# Patient Record
Sex: Female | Born: 1945
Health system: Southern US, Community
[De-identification: ages and names within clinical notes are randomized; demographics above are authoritative.]

## PROBLEM LIST (undated history)

## (undated) DIAGNOSIS — E785 Hyperlipidemia, unspecified: Secondary | ICD-10-CM

## (undated) DIAGNOSIS — K219 Gastro-esophageal reflux disease without esophagitis: Secondary | ICD-10-CM

## (undated) DIAGNOSIS — E119 Type 2 diabetes mellitus without complications: Secondary | ICD-10-CM

## (undated) DIAGNOSIS — H409 Unspecified glaucoma: Secondary | ICD-10-CM

## (undated) DIAGNOSIS — J84112 Idiopathic pulmonary fibrosis: Secondary | ICD-10-CM

## (undated) DIAGNOSIS — M1990 Unspecified osteoarthritis, unspecified site: Secondary | ICD-10-CM

## (undated) DIAGNOSIS — D649 Anemia, unspecified: Secondary | ICD-10-CM

## (undated) DIAGNOSIS — I4891 Unspecified atrial fibrillation: Secondary | ICD-10-CM

## (undated) DIAGNOSIS — R0602 Shortness of breath: Secondary | ICD-10-CM

## (undated) DIAGNOSIS — I509 Heart failure, unspecified: Secondary | ICD-10-CM

## (undated) DIAGNOSIS — F419 Anxiety disorder, unspecified: Secondary | ICD-10-CM

## (undated) DIAGNOSIS — T4145XA Adverse effect of unspecified anesthetic, initial encounter: Secondary | ICD-10-CM

## (undated) DIAGNOSIS — N189 Chronic kidney disease, unspecified: Secondary | ICD-10-CM

## (undated) DIAGNOSIS — T8859XA Other complications of anesthesia, initial encounter: Secondary | ICD-10-CM

## (undated) DIAGNOSIS — G4730 Sleep apnea, unspecified: Secondary | ICD-10-CM

## (undated) DIAGNOSIS — M545 Low back pain, unspecified: Secondary | ICD-10-CM

## (undated) DIAGNOSIS — D689 Coagulation defect, unspecified: Secondary | ICD-10-CM

## (undated) DIAGNOSIS — K649 Unspecified hemorrhoids: Secondary | ICD-10-CM

## (undated) DIAGNOSIS — F329 Major depressive disorder, single episode, unspecified: Secondary | ICD-10-CM

## (undated) DIAGNOSIS — F32A Depression, unspecified: Secondary | ICD-10-CM

## (undated) DIAGNOSIS — Z8489 Family history of other specified conditions: Secondary | ICD-10-CM

## (undated) DIAGNOSIS — I10 Essential (primary) hypertension: Secondary | ICD-10-CM

## (undated) DIAGNOSIS — K635 Polyp of colon: Secondary | ICD-10-CM

## (undated) DIAGNOSIS — IMO0002 Reserved for concepts with insufficient information to code with codable children: Secondary | ICD-10-CM

## (undated) HISTORY — PX: ABDOMINAL HYSTERECTOMY: SHX81

## (undated) HISTORY — DX: Coagulation defect, unspecified: D68.9

## (undated) HISTORY — DX: Type 2 diabetes mellitus without complications: E11.9

## (undated) HISTORY — PX: ROTATOR CUFF REPAIR: SHX139

## (undated) HISTORY — PX: CATARACT EXTRACTION, BILATERAL: SHX1313

## (undated) HISTORY — DX: Unspecified atrial fibrillation: I48.91

## (undated) HISTORY — DX: Gastro-esophageal reflux disease without esophagitis: K21.9

## (undated) HISTORY — DX: Unspecified hemorrhoids: K64.9

## (undated) HISTORY — PX: COLONOSCOPY: SHX174

## (undated) HISTORY — DX: Sleep apnea, unspecified: G47.30

## (undated) HISTORY — DX: Low back pain, unspecified: M54.50

## (undated) HISTORY — DX: Hyperlipidemia, unspecified: E78.5

## (undated) HISTORY — DX: Low back pain: M54.5

## (undated) HISTORY — DX: Polyp of colon: K63.5

## (undated) HISTORY — DX: Unspecified osteoarthritis, unspecified site: M19.90

## (undated) HISTORY — PX: CARDIAC CATHETERIZATION: SHX172

## (undated) HISTORY — DX: Essential (primary) hypertension: I10

## (undated) HISTORY — PX: FOOT SURGERY: SHX648

## (undated) HISTORY — PX: OTHER SURGICAL HISTORY: SHX169

---

## 1998-02-05 ENCOUNTER — Encounter: Admission: RE | Admit: 1998-02-05 | Discharge: 1998-05-06 | Payer: Self-pay | Admitting: Orthopaedic Surgery

## 1999-09-16 ENCOUNTER — Ambulatory Visit (HOSPITAL_BASED_OUTPATIENT_CLINIC_OR_DEPARTMENT_OTHER): Admission: RE | Admit: 1999-09-16 | Discharge: 1999-09-16 | Payer: Self-pay | Admitting: Orthopedic Surgery

## 2000-05-03 ENCOUNTER — Encounter: Payer: Self-pay | Admitting: Family Medicine

## 2000-05-03 ENCOUNTER — Encounter: Admission: RE | Admit: 2000-05-03 | Discharge: 2000-05-03 | Payer: Self-pay | Admitting: Family Medicine

## 2001-03-28 ENCOUNTER — Emergency Department (HOSPITAL_COMMUNITY): Admission: EM | Admit: 2001-03-28 | Discharge: 2001-03-28 | Payer: Self-pay

## 2001-05-20 ENCOUNTER — Encounter: Admission: RE | Admit: 2001-05-20 | Discharge: 2001-05-20 | Payer: Self-pay | Admitting: Family Medicine

## 2001-05-20 ENCOUNTER — Encounter: Payer: Self-pay | Admitting: Family Medicine

## 2001-12-30 ENCOUNTER — Emergency Department (HOSPITAL_COMMUNITY): Admission: EM | Admit: 2001-12-30 | Discharge: 2001-12-30 | Payer: Self-pay | Admitting: Emergency Medicine

## 2002-07-31 ENCOUNTER — Encounter: Payer: Self-pay | Admitting: Family Medicine

## 2002-07-31 ENCOUNTER — Encounter: Admission: RE | Admit: 2002-07-31 | Discharge: 2002-07-31 | Payer: Self-pay | Admitting: Family Medicine

## 2002-10-27 ENCOUNTER — Ambulatory Visit (HOSPITAL_COMMUNITY): Admission: RE | Admit: 2002-10-27 | Discharge: 2002-10-27 | Payer: Self-pay | Admitting: Gastroenterology

## 2002-10-27 ENCOUNTER — Encounter (INDEPENDENT_AMBULATORY_CARE_PROVIDER_SITE_OTHER): Payer: Self-pay | Admitting: *Deleted

## 2002-12-27 ENCOUNTER — Emergency Department (HOSPITAL_COMMUNITY): Admission: EM | Admit: 2002-12-27 | Discharge: 2002-12-28 | Payer: Self-pay

## 2003-02-13 ENCOUNTER — Encounter: Admission: RE | Admit: 2003-02-13 | Discharge: 2003-05-14 | Payer: Self-pay | Admitting: Family Medicine

## 2003-06-12 ENCOUNTER — Ambulatory Visit (HOSPITAL_BASED_OUTPATIENT_CLINIC_OR_DEPARTMENT_OTHER): Admission: RE | Admit: 2003-06-12 | Discharge: 2003-06-12 | Payer: Self-pay | Admitting: Pulmonary Disease

## 2004-04-29 ENCOUNTER — Ambulatory Visit: Payer: Self-pay | Admitting: Pulmonary Disease

## 2004-05-08 ENCOUNTER — Inpatient Hospital Stay (HOSPITAL_COMMUNITY): Admission: EM | Admit: 2004-05-08 | Discharge: 2004-05-11 | Payer: Self-pay | Admitting: Pulmonary Disease

## 2004-05-08 ENCOUNTER — Ambulatory Visit: Payer: Self-pay | Admitting: Cardiology

## 2004-05-09 ENCOUNTER — Encounter: Payer: Self-pay | Admitting: Cardiovascular Disease

## 2004-05-10 ENCOUNTER — Encounter: Payer: Self-pay | Admitting: Cardiology

## 2004-05-19 ENCOUNTER — Ambulatory Visit: Payer: Self-pay | Admitting: Cardiology

## 2004-05-22 ENCOUNTER — Ambulatory Visit: Payer: Self-pay | Admitting: Cardiology

## 2004-05-22 ENCOUNTER — Inpatient Hospital Stay (HOSPITAL_BASED_OUTPATIENT_CLINIC_OR_DEPARTMENT_OTHER): Admission: RE | Admit: 2004-05-22 | Discharge: 2004-05-22 | Payer: Self-pay | Admitting: Cardiology

## 2004-06-05 ENCOUNTER — Ambulatory Visit: Payer: Self-pay | Admitting: Pulmonary Disease

## 2004-06-25 ENCOUNTER — Ambulatory Visit: Payer: Self-pay | Admitting: Pulmonary Disease

## 2004-07-14 ENCOUNTER — Ambulatory Visit: Payer: Self-pay | Admitting: Internal Medicine

## 2004-08-04 ENCOUNTER — Ambulatory Visit: Payer: Self-pay | Admitting: Internal Medicine

## 2004-08-06 ENCOUNTER — Encounter: Payer: Self-pay | Admitting: Internal Medicine

## 2004-08-06 ENCOUNTER — Encounter: Admission: RE | Admit: 2004-08-06 | Discharge: 2004-08-06 | Payer: Self-pay | Admitting: Internal Medicine

## 2004-08-12 ENCOUNTER — Ambulatory Visit: Payer: Self-pay | Admitting: Internal Medicine

## 2004-09-22 ENCOUNTER — Encounter
Admission: RE | Admit: 2004-09-22 | Discharge: 2004-09-22 | Payer: Self-pay | Admitting: Physical Medicine and Rehabilitation

## 2004-10-28 ENCOUNTER — Encounter
Admission: RE | Admit: 2004-10-28 | Discharge: 2004-11-27 | Payer: Self-pay | Admitting: Physical Medicine and Rehabilitation

## 2005-02-09 ENCOUNTER — Encounter
Admission: RE | Admit: 2005-02-09 | Discharge: 2005-04-20 | Payer: Self-pay | Admitting: Physical Medicine and Rehabilitation

## 2005-05-12 ENCOUNTER — Ambulatory Visit: Payer: Self-pay | Admitting: Internal Medicine

## 2005-06-28 ENCOUNTER — Emergency Department (HOSPITAL_COMMUNITY): Admission: EM | Admit: 2005-06-28 | Discharge: 2005-06-28 | Payer: Self-pay | Admitting: Emergency Medicine

## 2005-08-17 ENCOUNTER — Ambulatory Visit: Payer: Self-pay | Admitting: Internal Medicine

## 2005-08-24 ENCOUNTER — Ambulatory Visit: Payer: Self-pay | Admitting: Internal Medicine

## 2005-09-24 ENCOUNTER — Ambulatory Visit: Payer: Self-pay | Admitting: Internal Medicine

## 2005-12-23 ENCOUNTER — Encounter: Admission: RE | Admit: 2005-12-23 | Discharge: 2005-12-23 | Payer: Self-pay | Admitting: Specialist

## 2006-01-27 ENCOUNTER — Encounter: Admission: RE | Admit: 2006-01-27 | Discharge: 2006-01-27 | Payer: Self-pay | Admitting: Specialist

## 2006-05-11 ENCOUNTER — Ambulatory Visit: Payer: Self-pay | Admitting: Internal Medicine

## 2006-05-11 LAB — CONVERTED CEMR LAB
AST: 19 units/L (ref 0–37)
Cholesterol: 191 mg/dL (ref 0–200)

## 2006-06-09 ENCOUNTER — Emergency Department (HOSPITAL_COMMUNITY): Admission: EM | Admit: 2006-06-09 | Discharge: 2006-06-09 | Payer: Self-pay | Admitting: Emergency Medicine

## 2006-07-06 ENCOUNTER — Ambulatory Visit: Payer: Self-pay | Admitting: Internal Medicine

## 2006-07-28 ENCOUNTER — Emergency Department (HOSPITAL_COMMUNITY): Admission: EM | Admit: 2006-07-28 | Discharge: 2006-07-28 | Payer: Self-pay | Admitting: Emergency Medicine

## 2006-08-19 ENCOUNTER — Ambulatory Visit: Payer: Self-pay | Admitting: Internal Medicine

## 2006-08-19 ENCOUNTER — Encounter: Payer: Self-pay | Admitting: Internal Medicine

## 2006-08-19 DIAGNOSIS — I10 Essential (primary) hypertension: Secondary | ICD-10-CM | POA: Insufficient documentation

## 2006-08-19 DIAGNOSIS — E785 Hyperlipidemia, unspecified: Secondary | ICD-10-CM | POA: Insufficient documentation

## 2006-08-19 DIAGNOSIS — K219 Gastro-esophageal reflux disease without esophagitis: Secondary | ICD-10-CM | POA: Insufficient documentation

## 2006-08-19 DIAGNOSIS — M545 Low back pain, unspecified: Secondary | ICD-10-CM | POA: Insufficient documentation

## 2006-09-03 ENCOUNTER — Ambulatory Visit: Payer: Self-pay | Admitting: Internal Medicine

## 2006-09-05 ENCOUNTER — Inpatient Hospital Stay (HOSPITAL_COMMUNITY): Admission: EM | Admit: 2006-09-05 | Discharge: 2006-09-08 | Payer: Self-pay | Admitting: Emergency Medicine

## 2006-09-06 ENCOUNTER — Ambulatory Visit: Payer: Self-pay | Admitting: Internal Medicine

## 2006-09-07 ENCOUNTER — Encounter: Payer: Self-pay | Admitting: Internal Medicine

## 2006-09-08 ENCOUNTER — Encounter: Payer: Self-pay | Admitting: Internal Medicine

## 2006-11-03 ENCOUNTER — Ambulatory Visit: Payer: Self-pay | Admitting: Internal Medicine

## 2006-11-03 LAB — CONVERTED CEMR LAB
Albumin: 3.8 g/dL (ref 3.5–5.2)
Alkaline Phosphatase: 44 units/L (ref 39–117)
BUN: 17 mg/dL (ref 6–23)
Basophils Absolute: 0 10*3/uL (ref 0.0–0.1)
Bilirubin Urine: NEGATIVE
Cholesterol: 265 mg/dL (ref 0–200)
Creatinine, Ser: 0.8 mg/dL (ref 0.4–1.2)
Direct LDL: 201.6 mg/dL
GFR calc Af Amer: 94 mL/min
Glucose, Urine, Semiquant: NEGATIVE
HCT: 39.4 % (ref 36.0–46.0)
Hemoglobin: 13.3 g/dL (ref 12.0–15.0)
Lymphocytes Relative: 47.5 % — ABNORMAL HIGH (ref 12.0–46.0)
MCHC: 33.8 g/dL (ref 30.0–36.0)
MCV: 83 fL (ref 78.0–100.0)
Monocytes Absolute: 0.5 10*3/uL (ref 0.2–0.7)
Monocytes Relative: 10.5 % (ref 3.0–11.0)
Neutro Abs: 1.9 10*3/uL (ref 1.4–7.7)
Neutrophils Relative %: 38.4 % — ABNORMAL LOW (ref 43.0–77.0)
Potassium: 4.2 meq/L (ref 3.5–5.1)
Protein, U semiquant: NEGATIVE
Sodium: 143 meq/L (ref 135–145)
Specific Gravity, Urine: 1.02
TSH: 3.44 microintl units/mL (ref 0.35–5.50)
Total Bilirubin: 0.9 mg/dL (ref 0.3–1.2)
Total Protein: 7.1 g/dL (ref 6.0–8.3)
pH: 5.5

## 2006-11-11 ENCOUNTER — Encounter: Payer: Self-pay | Admitting: Internal Medicine

## 2006-11-22 ENCOUNTER — Ambulatory Visit: Payer: Self-pay | Admitting: Internal Medicine

## 2006-11-22 DIAGNOSIS — J45909 Unspecified asthma, uncomplicated: Secondary | ICD-10-CM | POA: Insufficient documentation

## 2006-12-07 ENCOUNTER — Encounter: Admission: RE | Admit: 2006-12-07 | Discharge: 2007-03-07 | Payer: Self-pay | Admitting: Specialist

## 2006-12-15 ENCOUNTER — Telehealth: Payer: Self-pay | Admitting: Internal Medicine

## 2007-03-24 ENCOUNTER — Ambulatory Visit: Payer: Self-pay | Admitting: Internal Medicine

## 2007-06-15 ENCOUNTER — Ambulatory Visit: Payer: Self-pay | Admitting: Internal Medicine

## 2007-06-15 LAB — CONVERTED CEMR LAB
Basophils Absolute: 0 10*3/uL (ref 0.0–0.1)
Basophils Relative: 0.3 % (ref 0.0–1.0)
Calcium: 9.5 mg/dL (ref 8.4–10.5)
Cholesterol: 220 mg/dL (ref 0–200)
Creatinine, Ser: 0.7 mg/dL (ref 0.4–1.2)
Direct LDL: 147 mg/dL
Eosinophils Absolute: 0 10*3/uL (ref 0.0–0.7)
GFR calc Af Amer: 110 mL/min
GFR calc non Af Amer: 91 mL/min
HCT: 40.7 % (ref 36.0–46.0)
Hemoglobin: 13.6 g/dL (ref 12.0–15.0)
Monocytes Absolute: 0.8 10*3/uL (ref 0.1–1.0)
Neutro Abs: 6.3 10*3/uL (ref 1.4–7.7)
Neutrophils Relative %: 69.2 % (ref 43.0–77.0)
Total Bilirubin: 0.5 mg/dL (ref 0.3–1.2)
WBC: 9 10*3/uL (ref 4.5–10.5)

## 2007-06-27 ENCOUNTER — Encounter: Payer: Self-pay | Admitting: Internal Medicine

## 2007-06-29 ENCOUNTER — Ambulatory Visit: Payer: Self-pay | Admitting: Internal Medicine

## 2007-07-26 ENCOUNTER — Ambulatory Visit (HOSPITAL_COMMUNITY): Admission: RE | Admit: 2007-07-26 | Discharge: 2007-07-26 | Payer: Self-pay | Admitting: Specialist

## 2007-08-29 ENCOUNTER — Telehealth: Payer: Self-pay | Admitting: Internal Medicine

## 2007-09-28 ENCOUNTER — Ambulatory Visit: Payer: Self-pay | Admitting: Internal Medicine

## 2007-09-28 ENCOUNTER — Encounter: Admission: RE | Admit: 2007-09-28 | Discharge: 2007-09-28 | Payer: Self-pay | Admitting: Internal Medicine

## 2007-09-28 DIAGNOSIS — G4700 Insomnia, unspecified: Secondary | ICD-10-CM | POA: Insufficient documentation

## 2007-10-03 ENCOUNTER — Encounter: Payer: Self-pay | Admitting: Internal Medicine

## 2007-10-06 ENCOUNTER — Encounter: Admission: RE | Admit: 2007-10-06 | Discharge: 2007-10-06 | Payer: Self-pay | Admitting: Internal Medicine

## 2007-10-17 ENCOUNTER — Encounter: Payer: Self-pay | Admitting: Internal Medicine

## 2007-11-07 ENCOUNTER — Telehealth: Payer: Self-pay | Admitting: Internal Medicine

## 2007-11-14 ENCOUNTER — Encounter: Payer: Self-pay | Admitting: Internal Medicine

## 2007-12-02 ENCOUNTER — Encounter: Payer: Self-pay | Admitting: Internal Medicine

## 2008-01-27 ENCOUNTER — Telehealth: Payer: Self-pay | Admitting: Internal Medicine

## 2008-03-27 ENCOUNTER — Telehealth: Payer: Self-pay | Admitting: Internal Medicine

## 2008-04-17 ENCOUNTER — Telehealth: Payer: Self-pay | Admitting: Internal Medicine

## 2008-07-27 ENCOUNTER — Telehealth: Payer: Self-pay | Admitting: Internal Medicine

## 2009-01-24 ENCOUNTER — Telehealth: Payer: Self-pay | Admitting: Internal Medicine

## 2009-03-19 ENCOUNTER — Encounter: Payer: Self-pay | Admitting: Internal Medicine

## 2009-04-30 ENCOUNTER — Ambulatory Visit: Payer: Self-pay | Admitting: Internal Medicine

## 2009-04-30 DIAGNOSIS — R32 Unspecified urinary incontinence: Secondary | ICD-10-CM | POA: Insufficient documentation

## 2009-04-30 LAB — CONVERTED CEMR LAB
Bilirubin Urine: NEGATIVE
Glucose, Urine, Semiquant: NEGATIVE
pH: 6

## 2009-07-19 ENCOUNTER — Encounter: Payer: Self-pay | Admitting: Internal Medicine

## 2009-10-21 ENCOUNTER — Telehealth: Payer: Self-pay | Admitting: *Deleted

## 2010-01-20 ENCOUNTER — Ambulatory Visit: Payer: Self-pay | Admitting: Internal Medicine

## 2010-01-20 DIAGNOSIS — J689 Unspecified respiratory condition due to chemicals, gases, fumes and vapors: Secondary | ICD-10-CM | POA: Insufficient documentation

## 2010-01-20 DIAGNOSIS — J680 Bronchitis and pneumonitis due to chemicals, gases, fumes and vapors: Secondary | ICD-10-CM | POA: Insufficient documentation

## 2010-03-20 ENCOUNTER — Ambulatory Visit: Admit: 2010-03-20 | Payer: Self-pay | Admitting: Internal Medicine

## 2010-03-26 ENCOUNTER — Ambulatory Visit
Admission: RE | Admit: 2010-03-26 | Discharge: 2010-03-26 | Payer: Self-pay | Source: Home / Self Care | Attending: Internal Medicine | Admitting: Internal Medicine

## 2010-03-26 ENCOUNTER — Other Ambulatory Visit: Payer: Self-pay | Admitting: Internal Medicine

## 2010-03-26 LAB — CBC WITH DIFFERENTIAL/PLATELET
Basophils Absolute: 0 10*3/uL (ref 0.0–0.1)
Basophils Relative: 0.3 % (ref 0.0–3.0)
Eosinophils Absolute: 0.2 10*3/uL (ref 0.0–0.7)
Eosinophils Relative: 3.9 % (ref 0.0–5.0)
HCT: 41.9 % (ref 36.0–46.0)
Hemoglobin: 14.1 g/dL (ref 12.0–15.0)
Lymphocytes Relative: 48.9 % — ABNORMAL HIGH (ref 12.0–46.0)
Lymphs Abs: 3 10*3/uL (ref 0.7–4.0)
MCHC: 33.5 g/dL (ref 30.0–36.0)
MCV: 84 fl (ref 78.0–100.0)
Monocytes Absolute: 0.7 10*3/uL (ref 0.1–1.0)
Monocytes Relative: 10.8 % (ref 3.0–12.0)
Neutro Abs: 2.2 10*3/uL (ref 1.4–7.7)
Neutrophils Relative %: 36.1 % — ABNORMAL LOW (ref 43.0–77.0)
Platelets: 195 10*3/uL (ref 150.0–400.0)
RBC: 4.99 Mil/uL (ref 3.87–5.11)
RDW: 14.4 % (ref 11.5–14.6)
WBC: 6.2 10*3/uL (ref 4.5–10.5)

## 2010-03-26 LAB — HEPATIC FUNCTION PANEL
ALT: 30 U/L (ref 0–35)
AST: 24 U/L (ref 0–37)
Albumin: 4.1 g/dL (ref 3.5–5.2)
Alkaline Phosphatase: 57 U/L (ref 39–117)
Bilirubin, Direct: 0.1 mg/dL (ref 0.0–0.3)
Total Bilirubin: 0.6 mg/dL (ref 0.3–1.2)
Total Protein: 7.9 g/dL (ref 6.0–8.3)

## 2010-03-26 LAB — CONVERTED CEMR LAB
Bilirubin Urine: NEGATIVE
Glucose, Urine, Semiquant: NEGATIVE
Ketones, urine, test strip: NEGATIVE
Nitrite: NEGATIVE
Protein, U semiquant: NEGATIVE
Specific Gravity, Urine: 1.015
Urobilinogen, UA: 0.2
pH: 5.5

## 2010-03-26 LAB — BASIC METABOLIC PANEL
BUN: 17 mg/dL (ref 6–23)
CO2: 31 mEq/L (ref 19–32)
Calcium: 9.8 mg/dL (ref 8.4–10.5)
Chloride: 106 mEq/L (ref 96–112)
Creatinine, Ser: 0.8 mg/dL (ref 0.4–1.2)
GFR: 89.15 mL/min (ref >60.00)
Glucose, Bld: 89 mg/dL (ref 70–99)
Potassium: 4.6 mEq/L (ref 3.5–5.1)
Sodium: 143 mEq/L (ref 135–145)

## 2010-03-26 LAB — LIPID PANEL
Cholesterol: 237 mg/dL — ABNORMAL HIGH (ref 0–200)
HDL: 47.9 mg/dL (ref >39.00)
Total CHOL/HDL Ratio: 5
Triglycerides: 84 mg/dL (ref 0.0–149.0)
VLDL: 16.8 mg/dL (ref 0.0–40.0)

## 2010-03-26 LAB — TSH: TSH: 3.9 u[IU]/mL (ref 0.35–5.50)

## 2010-03-26 LAB — LDL CHOLESTEROL, DIRECT: Direct LDL: 187.3 mg/dL

## 2010-04-03 ENCOUNTER — Encounter: Payer: Self-pay | Admitting: Internal Medicine

## 2010-04-03 ENCOUNTER — Ambulatory Visit
Admission: RE | Admit: 2010-04-03 | Discharge: 2010-04-03 | Payer: Self-pay | Source: Home / Self Care | Attending: Internal Medicine | Admitting: Internal Medicine

## 2010-04-03 DIAGNOSIS — Z86010 Personal history of colonic polyps: Secondary | ICD-10-CM | POA: Insufficient documentation

## 2010-04-06 ENCOUNTER — Encounter: Payer: Self-pay | Admitting: Cardiology

## 2010-04-15 NOTE — Progress Notes (Signed)
Summary: Pt req Benazepril-HCTZ 20-12.5mg    Phone Note Refill Request Call back at [phone] cell Message from:  Patient on October 21, 2009 1:32 PM  Refills Requested: Medication #1:  BENAZEPRIL-HYDROCHLOROTHIAZIDE 20-12.5 MG  TABS 2 once daily   Dosage confirmed as above?Dosage Confirmed   Supply Requested: 3 months  Method Requested: Telephone to Pharmacy Initial call taken by: Lucy Antigua,  October 21, 2009 1:32 PM  Follow-up for Phone Call        Rx sent to pharmacy. Follow-up by: Romualdo Bolk, CMA Duncan Dull),  October 21, 2009 4:17 PM    Prescriptions: BENAZEPRIL-HYDROCHLOROTHIAZIDE 20-12.5 MG  TABS (BENAZEPRIL-HYDROCHLOROTHIAZIDE) 2 once daily  #180 Each x 0   Entered by:   Romualdo Bolk, CMA (AAMA)   Authorized by:   Gordy Savers  MD   Signed by:   Romualdo Bolk, CMA (AAMA) on 10/21/2009   Method used:   Electronically to        [facility] (retail)       [address]       [place], [place]  [address]       Ph: [phone]       Fax: [phone]   RxID:   [id number]

## 2010-04-15 NOTE — Letter (Signed)
Summary: [facility]  [facility]   Imported By: Maryln Gottron 08/07/2009 11:22:36  _____________________________________________________________________  External Attachment:    Type:   Image     Comment:   External Document

## 2010-04-15 NOTE — Assessment & Plan Note (Signed)
Summary: reaction to chemicals//ccm   Vital Signs:  Patient profile:   65 year old female Weight:      230 pounds O2 Sat:      98 % on Room air Temp:     98.2 degrees F oral Resp:     20 per minute BP sitting:   122 / 80  (right arm) Cuff size:   large  Vitals Entered By: Duard Brady LPN (January 20, 2010 9:51 AM)  O2 Flow:  Room air CC: resp. problems r/t inhalation of cleaning fliuds , also c/o (R) knee pain Is Patient Diabetic? No   CC:  resp. problems r/t inhalation of cleaning fliuds  and also c/o (R) knee pain.  History of Present Illness:  65 year old patient who is seen today complain of hoarseness, sore throats, and respiratory difficulties  .She was doing some bathroom cleaning yesterday, and mixed  Clorox with ammonia and immediately began having the difficulties with the exposure to the harsh chemicals.  She has had sore throat, cough, and hoarseness.  There's been no fever, wheezing, or sputum production.  She does have a history of mild asthma, as well as treated hypertension  Preventive Screening-Counseling & Management  Alcohol-Tobacco     Smoking Status: never  Allergies: 1)  ! Penicillin G Sodium (Penicillin G Sodium) 2)  ! Adult Aspirin Low Strength 3)  ! * Fish Oil 4)  ! * Shellfish  Past History:  Past Medical History: Reviewed history from 11/22/2006 and no changes required. GERD Hyperlipidemia Hypertension Sleep apnea Low back pain Asthma  Past Surgical History: Reviewed history from 11/22/2006 and no changes required. had heart catheterization, performed in February of 2006 which was normal  Review of Systems       The patient complains of hoarseness, dyspnea on exertion, and prolonged cough.  The patient denies anorexia, fever, weight loss, weight gain, vision loss, decreased hearing, chest pain, syncope, peripheral edema, headaches, hemoptysis, abdominal pain, melena, hematochezia, severe indigestion/heartburn, hematuria,  incontinence, genital sores, muscle weakness, suspicious skin lesions, transient blindness, difficulty walking, depression, unusual weight change, abnormal bleeding, enlarged lymph nodes, angioedema, and breast masses.    Physical Exam  General:  overweight-appearing.  normal blood pressure, no distress Head:  Normocephalic and atraumatic without obvious abnormalities. No apparent alopecia or balding. Eyes:  No corneal or conjunctival inflammation noted. EOMI. Perrla. Funduscopic exam benign, without hemorrhages, exudates or papilledema. Vision grossly normal. Ears:  External ear exam shows no significant lesions or deformities.  Otoscopic examination reveals clear canals, tympanic membranes are intact bilaterally without bulging, retraction, inflammation or discharge. Hearing is grossly normal bilaterally. Mouth:  pharyngeal erythema.   Neck:  No deformities, masses, or tenderness noted. Lungs:  Normal respiratory effort, chest expands symmetrically. Lungs are clear to auscultation, no crackles or wheezes.  O2 saturation 98 Heart:  Normal rate and regular rhythm. S1 and S2 normal without gallop, murmur, click, rub or other extra sounds.  heart rate 68   Impression & Recommendations:  Problem # 1:  BRONCHITIS&PNEUMONITIS DUE TO FUMES&VAPORS (ICD-506.0)  Problem # 2:  HYPERTENSION (ICD-401.9)  Her updated medication list for this problem includes:    Furosemide 80 Mg Tabs (Furosemide) .Marland Kitchen... 1/2 once daily    Benazepril-hydrochlorothiazide 20-12.5 Mg Tabs (Benazepril-hydrochlorothiazide) .Marland Kitchen... 2 once daily    Metoprolol Tartrate 50 Mg Tabs (Metoprolol tartrate) ..... One twice daily    Amlodipine Besylate 5 Mg Tabs (Amlodipine besylate) ..... One daily  Problem # 3:  ASTHMA (ICD-493.90)  Her  updated medication list for this problem includes:    Advair Diskus 250-50 Mcg/dose Misc (Fluticasone-salmeterol) ..... Use as dir  Complete Medication List: 1)  Advair Diskus 250-50 Mcg/dose  Misc (Fluticasone-salmeterol) .... Use as dir 2)  Furosemide 80 Mg Tabs (Furosemide) .... 1/2 once daily 3)  Benazepril-hydrochlorothiazide 20-12.5 Mg Tabs (Benazepril-hydrochlorothiazide) .... 2 once daily 4)  Meloxicam 7.5 Mg Tabs (Meloxicam) .Marland Kitchen.. 1 two times a day 5)  Flexeril 10 Mg Tabs (Cyclobenzaprine hcl) .Marland Kitchen.. 1 three times a day as needed 6)  [facility] Omeprazole 20 Mg Tbec (Omeprazole) .... One daily 7)  Zolpidem Tartrate 10 Mg Tabs (Zolpidem tartrate) .... One at bedtime as needed 8)  Metoprolol Tartrate 50 Mg Tabs (Metoprolol tartrate) .... One twice daily 9)  Amlodipine Besylate 5 Mg Tabs (Amlodipine besylate) .... One daily 10)  Lorazepam 0.5 Mg Tabs (Lorazepam) .... One twice daily as needed for anxiety 11)  Lovastatin 40 Mg Tabs (Lovastatin) .... One daily  Patient Instructions: 1)  avoid future chemical exposures 2)  use Advair twice daily 3)  Please schedule a follow-up appointment in 6 months for annual exam 4)  Limit your Sodium (Salt). 5)  call if you  develope  worsening wheezing or shortness of breath Prescriptions: LOVASTATIN 40 MG TABS (LOVASTATIN) one daily  #90 x 4   Entered and Authorized by:   Gordy Savers  MD   Signed by:   Gordy Savers  MD on 01/20/2010   Method used:   Print then Give to Patient   RxID:   [id number] LORAZEPAM 0.5 MG  TABS (LORAZEPAM) one twice daily as needed for anxiety  #60 x 2   Entered and Authorized by:   Gordy Savers  MD   Signed by:   Gordy Savers  MD on 01/20/2010   Method used:   Print then Give to Patient   RxID:   [id number] AMLODIPINE BESYLATE 5 MG  TABS (AMLODIPINE BESYLATE) one daily  #90 Each x 4   Entered and Authorized by:   Gordy Savers  MD   Signed by:   Gordy Savers  MD on 01/20/2010   Method used:   Print then Give to Patient   RxID:   [id number] METOPROLOL TARTRATE 50 MG  TABS (METOPROLOL TARTRATE) one twice daily  #180 Each x 6   Entered and Authorized by:    Gordy Savers  MD   Signed by:   Gordy Savers  MD on 01/20/2010   Method used:   Print then Give to Patient   RxID:   [id number] ZOLPIDEM TARTRATE 10 MG  TABS (ZOLPIDEM TARTRATE) one at bedtime as needed  #60 x 1   Entered and Authorized by:   Gordy Savers  MD   Signed by:   Gordy Savers  MD on 01/20/2010   Method used:   Print then Give to Patient   RxID:   [id number] [facility] OMEPRAZOLE 20 MG  TBEC (OMEPRAZOLE) one daily  #90 Each x 5   Entered and Authorized by:   Gordy Savers  MD   Signed by:   Gordy Savers  MD on 01/20/2010   Method used:   Print then Give to Patient   RxID:   [id number] FLEXERIL 10 MG  TABS (CYCLOBENZAPRINE HCL) 1 three times a day as needed  #90 x 6   Entered and Authorized by:   Gordy Savers  MD   Signed by:   Theron Arista  Lysle Dingwall  MD on 01/20/2010   Method used:   Print then Give to Patient   RxID:   [id number] MELOXICAM 7.5 MG  TABS (MELOXICAM) 1 two times a day  #90 x 6   Entered and Authorized by:   Gordy Savers  MD   Signed by:   Gordy Savers  MD on 01/20/2010   Method used:   Print then Give to Patient   RxID:   [id number] BENAZEPRIL-HYDROCHLOROTHIAZIDE 20-12.5 MG  TABS (BENAZEPRIL-HYDROCHLOROTHIAZIDE) 2 once daily  #180 Each x 2   Entered and Authorized by:   Gordy Savers  MD   Signed by:   Gordy Savers  MD on 01/20/2010   Method used:   Print then Give to Patient   RxID:   [id number] FUROSEMIDE 80 MG  TABS (FUROSEMIDE) 1/2 once daily  #90 x 6   Entered and Authorized by:   Gordy Savers  MD   Signed by:   Gordy Savers  MD on 01/20/2010   Method used:   Print then Give to Patient   RxID:   [id number]    Orders Added: 1)  Est. Patient Level III [42595]

## 2010-04-15 NOTE — Op Note (Signed)
Summary: Lumbar Epidural Steroid Injection/[facility]   Lumbar Epidural Steroid Injection/[facility]   Imported By: Maryln Gottron 03/27/2009 13:20:58  _____________________________________________________________________  External Attachment:    Type:   Image     Comment:   External Document

## 2010-04-15 NOTE — Assessment & Plan Note (Signed)
Summary: consult re: herpes virus/cjr   Vital Signs:  Patient profile:   65 year old female Height:      65 inches Weight:      228 pounds BMI:     38.08 Temp:     99.1 degrees F oral BP sitting:   138 / 80  (right arm) Cuff size:   large  Vitals Entered By: Duard Brady LPN (April 30, 2009 8:25 AM) CC: questions concerning herpes transmission , c/o urine incont.    CC:  questions concerning herpes transmission  and c/o urine incont. .  History of Present Illness: 64 year old patient who is seen today after an extended absence.  She has a history of dyslipidemia, but has not been taking Crestor due to cost concerns.  She has some concerns about herpes exposure that were addressed.  Complaints to include some urinary urgency and incontinence.  She has osteoarthritis and chronic low back pain that has been fairly stable.  She has treated hypertension and gastroesophageal reflux disease.  She has a remote history of asthma that has been stable.  She remains on maintenance Advair.  Allergies: 1)  ! Penicillin G Sodium (Penicillin G Sodium) 2)  ! Adult Aspirin Low Strength 3)  ! * Fish Oil 4)  ! * Shellfish  Past History:  Past Medical History: Reviewed history from 11/22/2006 and no changes required. GERD Hyperlipidemia Hypertension Sleep apnea Low back pain Asthma  Past Surgical History: Reviewed history from 11/22/2006 and no changes required. had heart catheterization, performed in February of 2006 which was normal  Family History: Reviewed history from 11/22/2006 and no changes required. father deceased, unclear causes mother died in her late 46s of an MI.  Also had breast cancer 3 brothers 8 sisters positive for colon cancer, breast cancer, cardiac disease  Review of Systems       The patient complains of incontinence.  The patient denies anorexia, fever, weight loss, weight gain, vision loss, decreased hearing, hoarseness, chest pain, syncope, dyspnea on  exertion, peripheral edema, prolonged cough, headaches, hemoptysis, abdominal pain, melena, hematochezia, severe indigestion/heartburn, hematuria, genital sores, muscle weakness, suspicious skin lesions, transient blindness, difficulty walking, depression, unusual weight change, abnormal bleeding, enlarged lymph nodes, angioedema, and breast masses.    Physical Exam  General:  overweight-appearing. blood pressure 130/84 Head:  Normocephalic and atraumatic without obvious abnormalities. No apparent alopecia or balding. Eyes:  No corneal or conjunctival inflammation noted. EOMI. Perrla. Funduscopic exam benign, without hemorrhages, exudates or papilledema. Vision grossly normal. Mouth:  Oral mucosa and oropharynx without lesions or exudates.  Teeth in good repair. Neck:  No deformities, masses, or tenderness noted. Lungs:  Normal respiratory effort, chest expands symmetrically. Lungs are clear to auscultation, no crackles or wheezes. Heart:  Normal rate and regular rhythm. S1 and S2 normal without gallop, murmur, click, rub or other extra sounds. Abdomen:  Bowel sounds positive,abdomen soft and non-tender without masses, organomegaly or hernias noted. Msk:  No deformity or scoliosis noted of thoracic or lumbar spine.   Pulses:  R and L carotid,radial,femoral,dorsalis pedis and posterior tibial pulses are full and equal bilaterally Extremities:  No clubbing, cyanosis, edema, or deformity noted with normal full range of motion of all joints.     Impression & Recommendations:  Problem # 1:  OTHER URINARY INCONTINENCE (ICD-788.39)  Orders: UA Dipstick w/o Micro (manual) (16109) will treat  with 7 days of Cipro for a UTI  Problem # 2:  HYPERTENSION (ICD-401.9)  Her updated medication list for  this problem includes:    Furosemide 80 Mg Tabs (Furosemide) .Marland Kitchen... 1/2 once daily    Benazepril-hydrochlorothiazide 20-12.5 Mg Tabs (Benazepril-hydrochlorothiazide) .Marland Kitchen... 2 once daily    Metoprolol  Tartrate 50 Mg Tabs (Metoprolol tartrate) ..... One twice daily    Amlodipine Besylate 5 Mg Tabs (Amlodipine besylate) ..... One daily  Her updated medication list for this problem includes:    Furosemide 80 Mg Tabs (Furosemide) .Marland Kitchen... 1/2 once daily    Benazepril-hydrochlorothiazide 20-12.5 Mg Tabs (Benazepril-hydrochlorothiazide) .Marland Kitchen... 2 once daily    Metoprolol Tartrate 50 Mg Tabs (Metoprolol tartrate) ..... One twice daily    Amlodipine Besylate 5 Mg Tabs (Amlodipine besylate) ..... One daily  Problem # 3:  HYPERLIPIDEMIA (ICD-272.4)  The following medications were removed from the medication list:    Crestor 20 Mg Tabs (Rosuvastatin calcium) .Marland Kitchen... 1 once daily Her updated medication list for this problem includes:    Lovastatin 40 Mg Tabs (Lovastatin) ..... One daily  The following medications were removed from the medication list:    Crestor 20 Mg Tabs (Rosuvastatin calcium) .Marland Kitchen... 1 once daily Her updated medication list for this problem includes:    Lovastatin 40 Mg Tabs (Lovastatin) ..... One daily  Problem # 4:  ASTHMA (ICD-493.90)  Her updated medication list for this problem includes:    Advair Diskus 250-50 Mcg/dose Misc (Fluticasone-salmeterol) ..... Use as dir  Her updated medication list for this problem includes:    Advair Diskus 250-50 Mcg/dose Misc (Fluticasone-salmeterol) ..... Use as dir  Problem # 5:  GERD (ICD-530.81)  Her updated medication list for this problem includes:    [facility] Omeprazole 20 Mg Tbec (Omeprazole) ..... One daily  Her updated medication list for this problem includes:    [facility] Omeprazole 20 Mg Tbec (Omeprazole) ..... One daily  Complete Medication List: 1)  Advair Diskus 250-50 Mcg/dose Misc (Fluticasone-salmeterol) .... Use as dir 2)  Furosemide 80 Mg Tabs (Furosemide) .... 1/2 once daily 3)  Benazepril-hydrochlorothiazide 20-12.5 Mg Tabs (Benazepril-hydrochlorothiazide) .... 2 once daily 4)  Meloxicam 7.5 Mg Tabs (Meloxicam) .Marland Kitchen.. 1 two  times a day 5)  Flexeril 10 Mg Tabs (Cyclobenzaprine hcl) .Marland Kitchen.. 1 three times a day as needed 6)  [facility] Omeprazole 20 Mg Tbec (Omeprazole) .... One daily 7)  Zolpidem Tartrate 10 Mg Tabs (Zolpidem tartrate) .... One at bedtime as needed 8)  Metoprolol Tartrate 50 Mg Tabs (Metoprolol tartrate) .... One twice daily 9)  Amlodipine Besylate 5 Mg Tabs (Amlodipine besylate) .... One daily 10)  Lorazepam 0.5 Mg Tabs (Lorazepam) .... One twice daily as needed for anxiety 11)  Lovastatin 40 Mg Tabs (Lovastatin) .... One daily 12)  Ciprofloxacin Hcl 500 Mg Tabs (Ciprofloxacin hcl) .... One twice daily  Patient Instructions: 1)  Please schedule a follow-up appointment in 3 months. 2)  Limit your Sodium (Salt). 3)  It is important that you exercise regularly at least 20 minutes 5 times a week. If you develop chest pain, have severe difficulty breathing, or feel very tired , stop exercising immediately and seek medical attention. 4)  You need to lose weight. Consider a lower calorie diet and regular exercise.  Prescriptions: CIPROFLOXACIN HCL 500 MG TABS (CIPROFLOXACIN HCL) one twice daily  #14 x 0   Entered and Authorized by:   Gordy Savers  MD   Signed by:   Gordy Savers  MD on 04/30/2009   Method used:   Print then Give to Patient   RxID:   [id number] LOVASTATIN 40 MG TABS (LOVASTATIN) one  daily  #90 x 4   Entered and Authorized by:   Gordy Savers  MD   Signed by:   Gordy Savers  MD on 04/30/2009   Method used:   Print then Give to Patient   RxID:   [id number] LORAZEPAM 0.5 MG  TABS (LORAZEPAM) one twice daily as needed for anxiety  #60 x 2   Entered and Authorized by:   Gordy Savers  MD   Signed by:   Gordy Savers  MD on 04/30/2009   Method used:   Print then Give to Patient   RxID:   [id number] AMLODIPINE BESYLATE 5 MG  TABS (AMLODIPINE BESYLATE) one daily  #90 Each x 4   Entered and Authorized by:   Gordy Savers  MD   Signed  by:   Gordy Savers  MD on 04/30/2009   Method used:   Print then Give to Patient   RxID:   [id number] METOPROLOL TARTRATE 50 MG  TABS (METOPROLOL TARTRATE) one twice daily  #180 Each x 6   Entered and Authorized by:   Gordy Savers  MD   Signed by:   Gordy Savers  MD on 04/30/2009   Method used:   Print then Give to Patient   RxID:   [id number] ZOLPIDEM TARTRATE 10 MG  TABS (ZOLPIDEM TARTRATE) one at bedtime as needed  #60 x 1   Entered and Authorized by:   Gordy Savers  MD   Signed by:   Gordy Savers  MD on 04/30/2009   Method used:   Print then Give to Patient   RxID:   [id number] [facility] OMEPRAZOLE 20 MG  TBEC (OMEPRAZOLE) one daily  #90 Each x 5   Entered and Authorized by:   Gordy Savers  MD   Signed by:   Gordy Savers  MD on 04/30/2009   Method used:   Print then Give to Patient   RxID:   [id number] MELOXICAM 7.5 MG  TABS (MELOXICAM) 1 two times a day  #90 x 6   Entered and Authorized by:   Gordy Savers  MD   Signed by:   Gordy Savers  MD on 04/30/2009   Method used:   Print then Give to Patient   RxID:   [id number] BENAZEPRIL-HYDROCHLOROTHIAZIDE 20-12.5 MG  TABS (BENAZEPRIL-HYDROCHLOROTHIAZIDE) 2 once daily  #180 Each x 3   Entered and Authorized by:   Gordy Savers  MD   Signed by:   Gordy Savers  MD on 04/30/2009   Method used:   Print then Give to Patient   RxID:   [id number] FUROSEMIDE 80 MG  TABS (FUROSEMIDE) 1/2 once daily  #90 x 6   Entered and Authorized by:   Gordy Savers  MD   Signed by:   Gordy Savers  MD on 04/30/2009   Method used:   Print then Give to Patient   RxID:   [id number] ADVAIR DISKUS 250-50 MCG/DOSE  MISC (FLUTICASONE-SALMETEROL) use as dir  #3 x 6   Entered and Authorized by:   Gordy Savers  MD   Signed by:   Gordy Savers  MD on 04/30/2009   Method used:   Print then Give to Patient   RxID:    [id number]   Laboratory Results   Urine Tests  Date/Time Received: April 30, 2009 8:34 AM  Date/Time Reported: April 30, 2009 8:34 AM   Routine Urinalysis  Color: yellow Appearance: Clear Glucose: negative   (Normal Range: Negative) Bilirubin: negative   (Normal Range: Negative) Ketone: negative   (Normal Range: Negative) Spec. Gravity: 1.015   (Normal Range: 1.003-1.035) Blood: .dmoderate   (Normal Range: Negative) pH: 6.0   (Normal Range: 5.0-8.0) Protein: trace   (Normal Range: Negative) Urobilinogen: 0.2   (Normal Range: 0-1) Nitrite: negative   (Normal Range: Negative) Leukocyte Esterace: small   (Normal Range: Negative)

## 2010-04-17 NOTE — Assessment & Plan Note (Signed)
Summary: cpx//ccm   Vital Signs:  Patient profile:   65 year old female Height:      65 inches Weight:      228 pounds BMI:     38.08 O2 Sat:      95 % on Room air Temp:     98.3 degrees F oral Pulse rate:   90 / minute BP sitting:   130 / 78  (right arm) Cuff size:   regular  Vitals Entered By: Duard Brady LPN (April 03, 2010 1:09 PM)  O2 Flow:  Room air CC: cpx - doing ok  Is Patient Diabetic? No   CC:  cpx - doing ok .  History of Present Illness: 65 year old patient who is in today for a health maintenance examination.  She has history of hypertension and dyslipidemia.  Laboratory studies were reviewed and cholesterol was elevated at 237.  She states that she does not take lovastatin every day.  She has treated hypertension, which has been stable.  She has a strong family history of both colonic and breast cancer.  She states today that she feels that she has had a remote colonoscopy and did have a polypectomy performed at that time.  No recent colonoscopy.  Likewise, no recent mammogram.  Preventive Screening-Counseling & Management  Alcohol-Tobacco     Smoking Status: never  Caffeine-Diet-Exercise     Does Patient Exercise: yes  Allergies: 1)  ! Penicillin G Sodium (Penicillin G Sodium) 2)  ! Adult Aspirin Low Strength 3)  ! * Fish Oil 4)  ! * Shellfish  Past History:  Past Medical History: GERD Hyperlipidemia Hypertension Sleep apnea Low back pain Asthma Colonic polyps, hx of  Past Surgical History: had heart catheterization, performed in February of 2006 which was normal colonoscopy - remote Hysterectomy-ectopic, fibroids Foot surgery- bilat   Family History: Reviewed history from 11/22/2006 and no changes required. father deceased, unclear causes mother died in her late 4s of an MI.  Also had breast cancer 3 brothers 8 sisters (3  breast Ca)  lung ca  positive for colon cance (2 brothers), breast cancer, cardiac disease  Social  History: Reviewed history and no changes required. 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 4 grndchildren Never Smoked Regular exercise-yes Does Patient Exercise:  yes  Review of Systems  The patient denies anorexia, fever, weight loss, weight gain, vision loss, decreased hearing, hoarseness, chest pain, syncope, dyspnea on exertion, peripheral edema, prolonged cough, headaches, hemoptysis, abdominal pain, melena, hematochezia, severe indigestion/heartburn, hematuria, incontinence, genital sores, muscle weakness, suspicious skin lesions, transient blindness, difficulty walking, depression, unusual weight change, abnormal bleeding, enlarged lymph nodes, angioedema, and breast masses.    Physical Exam  General:  overweight-appearing.  130/86overweight-appearing.   Head:  Normocephalic and atraumatic without obvious abnormalities. No apparent alopecia or balding. Eyes:  No corneal or conjunctival inflammation noted. EOMI. Perrla. Funduscopic exam benign, without hemorrhages, exudates or papilledema. Vision grossly normal. Ears:  External ear exam shows no significant lesions or deformities.  Otoscopic examination reveals clear canals, tympanic membranes are intact bilaterally without bulging, retraction, inflammation or discharge. Hearing is grossly normal bilaterally. Nose:  External nasal examination shows no deformity or inflammation. Nasal mucosa are pink and moist without lesions or exudates. Mouth:  Oral mucosa and oropharynx without lesions or exudates.  Teeth in good repair. Neck:  No deformities, masses, or tenderness noted. Chest Wall:  No deformities, masses, or tenderness noted. Breasts:  No mass, nodules, thickening, tenderness, bulging, retraction, inflamation, nipple discharge or skin  changes noted.   Lungs:  Normal respiratory effort, chest expands symmetrically. Lungs are clear to auscultation, no crackles or wheezes. Heart:  Normal rate and regular rhythm. S1 and S2 normal without  gallop, murmur, click, rub or other extra sounds. Abdomen:  Bowel sounds positive,abdomen soft and non-tender without masses, organomegaly or hernias noted. Rectal:  No external abnormalities noted. Normal sphincter tone. No rectal masses or tenderness.  stool hematest negative Genitalia:  status post hysterectomy Msk:  No deformity or scoliosis noted of thoracic or lumbar spine.   Pulses:  R and L carotid,radial,femoral,dorsalis pedis and posterior tibial pulses are full and equal bilaterally Extremities:  No clubbing, cyanosis, edema, or deformity noted with normal full range of motion of all joints.   Neurologic:  No cranial nerve deficits noted. Station and gait are normal. Plantar reflexes are down-going bilaterally. DTRs are symmetrical throughout. Sensory, motor and coordinative functions appear intact. Skin:  Intact without suspicious lesions or rashes Cervical Nodes:  No lymphadenopathy noted Axillary Nodes:  No palpable lymphadenopathy Inguinal Nodes:  No significant adenopathy Psych:  Cognition and judgment appear intact. Alert and cooperative with normal attention span and concentration. No apparent delusions, illusions, hallucinations   Impression & Recommendations:  Problem # 1:  Preventive Health Care (ICD-V70.0)  Orders: Gastroenterology Referral (GI)  Complete Medication List: 1)  Advair Diskus 250-50 Mcg/dose Misc (Fluticasone-salmeterol) .... Use as dir 2)  Furosemide 80 Mg Tabs (Furosemide) .... 1/2 once daily 3)  Benazepril-hydrochlorothiazide 20-12.5 Mg Tabs (Benazepril-hydrochlorothiazide) .... 2 once daily 4)  Meloxicam 7.5 Mg Tabs (Meloxicam) .Marland Kitchen.. 1 two times a day 5)  Flexeril 10 Mg Tabs (Cyclobenzaprine hcl) .Marland Kitchen.. 1 three times a day as needed 6)  [facility] Omeprazole 20 Mg Tbec (Omeprazole) .... One daily 7)  Zolpidem Tartrate 10 Mg Tabs (Zolpidem tartrate) .... One at bedtime as needed 8)  Metoprolol Tartrate 50 Mg Tabs (Metoprolol tartrate) .... One twice  daily 9)  Amlodipine Besylate 5 Mg Tabs (Amlodipine besylate) .... One daily 10)  Lorazepam 0.5 Mg Tabs (Lorazepam) .... One twice daily as needed for anxiety 11)  Lovastatin 40 Mg Tabs (Lovastatin) .... One daily 12)  Vimovo 500-20 Mg Tbec (Naproxen-esomeprazole) .... One twice daily  Other Orders: EKG w/ Interpretation (93000)  Patient Instructions: 1)  Please schedule a follow-up appointment in 3 months. 2)  Limit your Sodium (Salt). 3)  It is important that you exercise regularly at least 20 minutes 5 times a week. If you develop chest pain, have severe difficulty breathing, or feel very tired , stop exercising immediately and seek medical attention. 4)  You need to lose weight. Consider a lower calorie diet and regular exercise.  5)  Schedule your mammogram. 6)  Schedule a colonoscopy/sigmoidoscopy to help detect colon cancer. 7)  Take calcium +Vitamin D daily. Prescriptions: VIMOVO 500-20 MG TBEC (NAPROXEN-ESOMEPRAZOLE) one twice daily  #180 x 4   Entered and Authorized by:   Gordy Savers  MD   Signed by:   Gordy Savers  MD on 04/03/2010   Method used:   Print then Give to Patient   RxID:   [id number] LOVASTATIN 40 MG TABS (LOVASTATIN) one daily  #90 x 4   Entered and Authorized by:   Gordy Savers  MD   Signed by:   Gordy Savers  MD on 04/03/2010   Method used:   Print then Give to Patient   RxID:   [id number] LORAZEPAM 0.5 MG  TABS (LORAZEPAM) one twice daily as  needed for anxiety  #60 x 2   Entered and Authorized by:   Gordy Savers  MD   Signed by:   Gordy Savers  MD on 04/03/2010   Method used:   Print then Give to Patient   RxID:   [id number] AMLODIPINE BESYLATE 5 MG  TABS (AMLODIPINE BESYLATE) one daily  #90 Each x 4   Entered and Authorized by:   Gordy Savers  MD   Signed by:   Gordy Savers  MD on 04/03/2010   Method used:   Print then Give to Patient   RxID:   [id number] METOPROLOL  TARTRATE 50 MG  TABS (METOPROLOL TARTRATE) one twice daily  #180 Each x 6   Entered and Authorized by:   Gordy Savers  MD   Signed by:   Gordy Savers  MD on 04/03/2010   Method used:   Print then Give to Patient   RxID:   [id number] ZOLPIDEM TARTRATE 10 MG  TABS (ZOLPIDEM TARTRATE) one at bedtime as needed  #60 x 1   Entered and Authorized by:   Gordy Savers  MD   Signed by:   Gordy Savers  MD on 04/03/2010   Method used:   Print then Give to Patient   RxID:   [id number] [facility] OMEPRAZOLE 20 MG  TBEC (OMEPRAZOLE) one daily  #90 Each x 5   Entered and Authorized by:   Gordy Savers  MD   Signed by:   Gordy Savers  MD on 04/03/2010   Method used:   Print then Give to Patient   RxID:   [id number] FLEXERIL 10 MG  TABS (CYCLOBENZAPRINE HCL) 1 three times a day as needed  #90 x 6   Entered and Authorized by:   Gordy Savers  MD   Signed by:   Gordy Savers  MD on 04/03/2010   Method used:   Print then Give to Patient   RxID:   [id number] BENAZEPRIL-HYDROCHLOROTHIAZIDE 20-12.5 MG  TABS (BENAZEPRIL-HYDROCHLOROTHIAZIDE) 2 once daily  #180 Each x 2   Entered and Authorized by:   Gordy Savers  MD   Signed by:   Gordy Savers  MD on 04/03/2010   Method used:   Print then Give to Patient   RxID:   [id number] FUROSEMIDE 80 MG  TABS (FUROSEMIDE) 1/2 once daily  #90 x 6   Entered and Authorized by:   Gordy Savers  MD   Signed by:   Gordy Savers  MD on 04/03/2010   Method used:   Print then Give to Patient   RxID:   [id number] ADVAIR DISKUS 250-50 MCG/DOSE  MISC (FLUTICASONE-SALMETEROL) use as dir  #3 x 6   Entered and Authorized by:   Gordy Savers  MD   Signed by:   Gordy Savers  MD on 04/03/2010   Method used:   Print then Give to Patient   RxID:   [id number]    Orders Added: 1)  EKG w/ Interpretation [93000] 2)  Est. Patient 40-64 years [[address]] 3)   Gastroenterology Referral [GI]

## 2010-04-25 ENCOUNTER — Encounter (INDEPENDENT_AMBULATORY_CARE_PROVIDER_SITE_OTHER): Payer: Self-pay | Admitting: *Deleted

## 2010-04-28 ENCOUNTER — Encounter (INDEPENDENT_AMBULATORY_CARE_PROVIDER_SITE_OTHER): Payer: Self-pay | Admitting: *Deleted

## 2010-04-29 ENCOUNTER — Encounter: Payer: Self-pay | Admitting: Gastroenterology

## 2010-05-01 NOTE — Letter (Signed)
Summary: Pre Visit Letter Revised  [facility]  [address] [place], [place] [address]   Phone: [phone]  Fax: [phone]        04/25/2010 MRN: [id number] [facility] [place] [address] [place], [place]  [address]             Procedure Date:  05-12-10   Welcome to the Gastroenterology Division at [facility].    You are scheduled to see a nurse for your pre-procedure visit on 04-29-10 at 2:30P.M. on the 3rd floor at [name], [address]  We ask that you try to arrive at our office 15 minutes prior to your appointment time to allow for check-in.  Please take a minute to review the attached form.  If you answer "Yes" to one or more of the questions on the first page, we ask that you call the person listed at your earliest opportunity.  If you answer "No" to all of the questions, please complete the rest of the form and bring it to your appointment.    Your nurse visit will consist of discussing your medical and surgical history, your immediate family medical history, and your medications.   If you are unable to list all of your medications on the form, please bring the medication bottles to your appointment and we will list them.  We will need to be aware of both prescribed and over the counter drugs.  We will need to know exact dosage information as well.    Please be prepared to read and sign documents such as consent forms, a financial agreement, and acknowledgement forms.  If necessary, and with your consent, a friend or relative is welcome to sit-in on the nurse visit with you.  Please bring your insurance card so that we may make a copy of it.  If your insurance requires a referral to see a specialist, please bring your referral form from your primary care physician.  No co-pay is required for this nurse visit.     If you cannot keep your appointment, please call [phone] to cancel or reschedule prior to your appointment date.  This allows  [place] the opportunity to schedule an appointment for another patient in need of care.    Thank you for choosing [facility] for your medical needs.  We appreciate the opportunity to care for you.  Please visit [place] at our website  to learn more about our practice.  Sincerely, The Gastroenterology Division

## 2010-05-07 NOTE — Miscellaneous (Signed)
Summary: [facility] Previsit/prep  Clinical Lists Changes  Medications: Added new medication of MOVIPREP 100 GM  SOLR (PEG-KCL-NACL-NASULF-NA ASC-C) As per prep instructions. - Signed Rx of MOVIPREP 100 GM  SOLR (PEG-KCL-NACL-NASULF-NA ASC-C) As per prep instructions.;  #1 x 0;  Signed;  Entered by: Wyona Almas RN;  Authorized by: Louis Meckel MD;  Method used: Electronically to [facility] [address] [place], [place], [place]  [address], Ph: [phone], Fax: [phone] Allergies: Changed allergy or adverse reaction from PENICILLIN G SODIUM (PENICILLIN G SODIUM) to PENICILLIN G SODIUM (PENICILLIN G SODIUM) Changed allergy or adverse reaction from ADULT ASPIRIN LOW STRENGTH to ADULT ASPIRIN LOW STRENGTH Added new allergy or adverse reaction of IBUPROFEN    Prescriptions: MOVIPREP 100 GM  SOLR (PEG-KCL-NACL-NASULF-NA ASC-C) As per prep instructions.  #1 x 0   Entered by:   Wyona Almas RN   Authorized by:   Louis Meckel MD   Signed by:   Wyona Almas RN on 04/29/2010   Method used:   Electronically to        [facility] (retail)       [address]       [place], [place]  [address]       Ph: [phone]       Fax: [phone]   RxID:   [id number]

## 2010-05-07 NOTE — Letter (Signed)
Summary: [facility] Instructions  [facility]  [address] [place], [place] [address]   Phone: [phone]  Fax: [phone]       Miranda [name]    12/21/1945    MRN: [id number]        Procedure Day Dorna Bloom:  Duanne Limerick  05/12/10     Arrival Time:  10:30AM     Procedure Time:  11:30AM     Location of Procedure:                    Juliann Pares _  [facility] (4th Floor)                      PREPARATION FOR COLONOSCOPY WITH MOVIPREP   Starting 5 days prior to your procedure 05/07/10 do not eat nuts, seeds, popcorn, corn, beans, peas,  salads, or any raw vegetables.  Do not take any fiber supplements (e.g. Metamucil, Citrucel, and Benefiber).  THE DAY BEFORE YOUR PROCEDURE         DATE: 05/11/10  DAY: SUNDAY  1.  Drink clear liquids the entire day-NO SOLID FOOD  2.  Do not drink anything colored red or purple.  Avoid juices with pulp.  No orange juice.  3.  Drink at least 64 oz. (8 glasses) of fluid/clear liquids during the day to prevent dehydration and help the prep work efficiently.  CLEAR LIQUIDS INCLUDE: Water Jello Ice Popsicles Tea (sugar ok, no milk/cream) Powdered fruit flavored drinks Coffee (sugar ok, no milk/cream) Gatorade Juice: apple, white grape, white cranberry  Lemonade Clear bullion, consomm, broth Carbonated beverages (any kind) Strained chicken noodle soup Hard Candy                             4.  In the morning, mix first dose of MoviPrep solution:    Empty 1 Pouch A and 1 Pouch B into the disposable container    Add lukewarm drinking water to the top line of the container. Mix to dissolve    Refrigerate (mixed solution should be used within 24 hrs)  5.  Begin drinking the prep at 5:00 p.m. The MoviPrep container is divided by 4 marks.   Every 15 minutes drink the solution down to the next mark (approximately 8 oz) until the full liter is complete.   6.  Follow completed prep with 16 oz of clear liquid of your choice (Nothing  red or purple).  Continue to drink clear liquids until bedtime.  7.  Before going to bed, mix second dose of MoviPrep solution:    Empty 1 Pouch A and 1 Pouch B into the disposable container    Add lukewarm drinking water to the top line of the container. Mix to dissolve    Refrigerate  THE DAY OF YOUR PROCEDURE      DATE: 05/12/10   DAY: MONDAY  Beginning at 6:30AM (5 hours before procedure):         1. Every 15 minutes, drink the solution down to the next mark (approx 8 oz) until the full liter is complete.  2. Follow completed prep with 16 oz. of clear liquid of your choice.    3. You may drink clear liquids until 9:30AM (2 HOURS BEFORE PROCEDURE).   MEDICATION INSTRUCTIONS  Unless otherwise instructed, you should take regular prescription medications with a small sip of water   as early as possible the morning of  your procedure.   Additional medication instructions: Hold furosemide and Benazipril/HCTZ the morning of procedure.         OTHER INSTRUCTIONS  You will need a responsible adult at least 64 years of age to accompany you and drive you home.   This person must remain in the waiting room during your procedure.  Wear loose fitting clothing that is easily removed.  Leave jewelry and other valuables at home.  However, you may wish to bring a book to read or  an iPod/MP3 player to listen to music as you wait for your procedure to start.  Remove all body piercing jewelry and leave at home.  Total time from sign-in until discharge is approximately 2-3 hours.  You should go home directly after your procedure and rest.  You can resume normal activities the  day after your procedure.  The day of your procedure you should not:   Drive   Make legal decisions   Operate machinery   Drink alcohol   Return to work  You will receive specific instructions about eating, activities and medications before you leave.    The above instructions have been reviewed  and explained to me by   Wyona Almas RN  April 29, 2010 3:39 PM     I fully understand and can verbalize these instructions _____________________________ Date _________

## 2010-05-12 ENCOUNTER — Other Ambulatory Visit: Payer: Self-pay | Admitting: Gastroenterology

## 2010-05-12 ENCOUNTER — Other Ambulatory Visit (AMBULATORY_SURGERY_CENTER): Payer: Medicare Other | Admitting: Gastroenterology

## 2010-05-12 DIAGNOSIS — Z1211 Encounter for screening for malignant neoplasm of colon: Secondary | ICD-10-CM

## 2010-05-12 DIAGNOSIS — Z86010 Personal history of colonic polyps: Secondary | ICD-10-CM

## 2010-05-12 DIAGNOSIS — Z800 Family history of malignant neoplasm of digestive organs: Secondary | ICD-10-CM

## 2010-05-12 DIAGNOSIS — D126 Benign neoplasm of colon, unspecified: Secondary | ICD-10-CM

## 2010-05-16 ENCOUNTER — Encounter: Payer: Self-pay | Admitting: Gastroenterology

## 2010-05-22 NOTE — Letter (Signed)
Summary: Patient Notice- Polyp Results  [facility]  [address] [place], [place] [address]   Phone: [phone]  Fax: [phone]        May 16, 2010 MRN: [id number]    Miranda [name] [address] [place], [place]  [address]    Dear Ms. Stamey,  I am pleased to inform you that the colon polyp(s) removed during your recent colonoscopy was (were) found to be benign (no cancer detected) upon pathologic examination.  I recommend you have a repeat colonoscopy examination in 3_ years to look for recurrent polyps, as having colon polyps increases your risk for having recurrent polyps or even colon cancer in the future.  Should you develop new or worsening symptoms of abdominal pain, bowel habit changes or bleeding from the rectum or bowels, please schedule an evaluation with either your primary care physician or with me.  Additional information/recommendations:  __ No further action with gastroenterology is needed at this time. Please      follow-up with your primary care physician for your other healthcare      needs.  __ Please call [phone] to schedule a return visit to review your      situation.  __ Please keep your follow-up visit as already scheduled.  _x_ Continue treatment plan as outlined the day of your exam.  Please call us if you are having persistent problems or have questions about your condition that have not been fully answered at this time.  Sincerely,  Louis Meckel MD  This letter has been electronically signed by your physician.  Appended Document: Patient Notice- Polyp Results letter mailed

## 2010-05-22 NOTE — Procedures (Addendum)
Summary: Colonoscopy  Patient: Miranda [name] Note: All result statuses are Final unless otherwise noted.  Tests: (1) Colonoscopy (COL)   COL Colonoscopy           DONE     [facility]     [address]     [place], [place]  [address]          COLONOSCOPY PROCEDURE REPORT          PATIENT:  Miranda, [name]  MR#:  [id number]     BIRTHDATE:  [birth year], 64 yrs. old  GENDER:  female          ENDOSCOPIST:  Barbette Hair. Arlyce Dice, MD     Referred by:          PROCEDURE DATE:  05/12/2010     PROCEDURE:  Colonoscopy with polypectomy and submucosal injection,     Colonoscopy with snare polypectomy, Colon with cold biopsy     polypectomy     ASA CLASS:  Class II     INDICATIONS:  1) screening  2) history of pre-cancerous     (adenomatous) colon polyps  3) history of colon cancer  4) family     history of colon cancer Index polypectomy 2004     2 brothers with colon Ca          MEDICATIONS:   Fentanyl 100 mcg IV, Versed 10 mg IV, Benadryl 37.5     mg IV          DESCRIPTION OF PROCEDURE:   After the risks benefits and     alternatives of the procedure were thoroughly explained, informed     consent was obtained.  Digital rectal exam was performed and     revealed no abnormalities.   The LB 180AL [id number] endoscope was     introduced through the anus and advanced to the cecum, which was     identified by the ileocecal valve, without limitations.  The     quality of the prep was good, using MoviPrep.  The instrument was     then slowly withdrawn as the colon was fully examined.     <<PROCEDUREIMAGES>>          FINDINGS:  There were 2 polyps identified and removed. in the     cecum. 4mm and 1cm sessile polyps in cecum. The former was removed     with cold polypectomy snare and submitted to pathology. The latter     was removed piecemeal after 2.5cc NS was injected submucosally.     Hot polypectomy snare was used (see image10, image14, and     image19).  Two polyps were found  in the ascending colon (see     image5). 2 2mm polyps - removed with cold bx forceps  A     pedunculated polyp was found at the splenic flexure. It was 5 mm     in size. Polyp was snared, then cauterized with monopolar cautery.     Retrieval was successful (see image2). snare polyp  Scattered     diverticula were found (see image3). descending colon to hepatic     flexure  This was otherwise a normal examination of the colon (see     image18, image20, image23, and image27).   Retroflexed views in     the rectum revealed no abnormalities.    The time to cecum =  8.75     minutes. The scope was then withdrawn (time =  23.0  min) from the     patient and the procedure completed.          COMPLICATIONS:  None          ENDOSCOPIC IMPRESSION:     1) Polyps, multiple in the cecum     2) Two polyps in the ascending colon     3) 5 mm pedunculated polyp at the splenic flexure     4) Diverticula, scattered     5) Otherwise normal examination     RECOMMENDATIONS:     1) Avoid NSAIDS x 2 weeks     2) Colonoscopy 3 years     3) Sedation with MAC for future procedures          REPEAT EXAM:   3 year(s)          ______________________________     Barbette Hair. Arlyce Dice, MD          CC:  Gordy Savers, MD          n.     Rosalie Doctor:   Barbette Hair. Brentin Shin at 05/12/2010 01:05 PM          Miranda [name], [id number]  Note: An exclamation mark (!) indicates a result that was not dispersed into the flowsheet. Document Creation Date: 05/12/2010 1:06 PM _______________________________________________________________________  (1) Order result status: Final Collection or observation date-time: 05/12/2010 12:54 Requested date-time:  Receipt date-time:  Reported date-time:  Referring Physician:   Ordering Physician: [name] [phone]) Specimen Source:  Source: Launa Grill Order Number: [id number] Lab site:   Appended Document: Colonoscopy     Procedures Next Due Date:    Colonoscopy:  05/2013

## 2010-05-27 ENCOUNTER — Other Ambulatory Visit: Payer: Self-pay | Admitting: Internal Medicine

## 2010-05-27 DIAGNOSIS — Z1231 Encounter for screening mammogram for malignant neoplasm of breast: Secondary | ICD-10-CM

## 2010-06-02 ENCOUNTER — Ambulatory Visit (HOSPITAL_COMMUNITY): Payer: Medicare Other

## 2010-06-05 ENCOUNTER — Ambulatory Visit (HOSPITAL_COMMUNITY)
Admission: RE | Admit: 2010-06-05 | Discharge: 2010-06-05 | Disposition: A | Discharge status: Home / Self Care | Payer: Medicare Other | Source: Ambulatory Visit | Attending: Internal Medicine | Admitting: Internal Medicine

## 2010-06-05 DIAGNOSIS — Z1231 Encounter for screening mammogram for malignant neoplasm of breast: Secondary | ICD-10-CM

## 2010-07-29 NOTE — H&P (Signed)
Miranda [name], Miranda [name] NO.:  [id number]   MEDICAL RECORD NO.:  [id number]          PATIENT TYPE:  INP   LOCATION:  5524                         FACILITY:  [facility]   PHYSICIAN:  Sean A. Everardo All, MD    DATE OF BIRTH:  April 29, 1945   DATE OF ADMISSION:  09/05/2006  DATE OF DISCHARGE:                              HISTORY & PHYSICAL   REASON FOR ADMISSION:  Left shoulder pain.   HISTORY OF PRESENT ILLNESS:  A 65 year old woman with 12 hours of severe  pain in the left shoulder area exacerbated by any movement of the left  upper extremity.  She has some associated chest pain.   PAST MEDICAL HISTORY:  1. Negative heart catheterization 2 years ago.  2. Chronic low back pain, and she has had several injections.  3. Hypertension.  4. Asthma.  5. Dyslipidemia.  6. Anxiety.   MEDICATIONS:  1. Percocet 7.5/500 mg one every 4 hours as needed for pain.  2. Advair uncertain dosage.  3. Crestor or 20 mg q.h.s.  4. Benicar/HCT 20/12.5 mg one daily.  5. Lopressor 100 mg daily.   She does not drink nor smoke.   SOCIAL HISTORY:  She recently had to stop working at [employer] because  of a back injury.   FAMILY HISTORY:  Negative for the above.   REVIEW OF SYSTEMS:  Denies the following:  Weight loss, fever, headache,  skin rash, numbness, cough, abdominal pain, rectal bleeding, hematuria,  dysuria.  She does have slight shortness of breath but denies any recent  visual loss.   PHYSICAL EXAMINATION:  VITAL SIGNS:  Blood pressure is 160/90, heart  rate 90, respiratory rate 20, temperature is 98.5.  GENERAL:  Slight distress due to pain.  SKIN:  No rash, not diaphoretic.  HEENT: No evidence of head trauma to external examination.  No  proptosis.  No periorbital swelling.  Pharynx is normal.  NECK: Supple.  No goiter.  CHEST:  Clear to auscultation.  The chest wall is nontender.  No  respiratory distress.  CARDIOVASCULAR:  No edema.  Regular rate and rhythm.  No murmur.   Pedal  pulses are intact.  ABDOMEN:  Soft, obese, nontender.  No hepatosplenomegaly, no mass,.  BREASTS GYNECOLOGIC, RECTAL:  Not done at this time due to patient  condition.  EXTREMITIES: There is severe pain at the left shoulder with any range of  motion.  Otherwise, a normal range of motion of the left upper  extremity, no swelling, tenderness, no erythema, no warmth.   LABORATORY STUDIES:  D-dimer 0.59.  Chest x-ray:  No acute disease.  Left shoulder x-ray:  Chronic calcific tendinitis.   IMPRESSION:  1. Chest pain and slight shortness of breath with an abnormal D-dimer.  2. Left shoulder pain, probably due to #3.  3. Chronic tendinitis.  4. Hypertension, which probably has a situational component with her      pain.  5. Asthma.  6. Chronic low back pain.   PLAN:  1. Check CT of the chest.  2. Admit to any medical bed.  3. Symptomatic therapy.  4. Will need a consult with orthopedics or physical therapy tomorrow.  5. I discussed code status with the patient.  She requests full code.      However, she states she would not want to be started nor maintained      on artificial life support measures if there was not a reasonable      chance of a functional recovery.      Sean A. Everardo All, MD  Electronically Signed     SAE/MEDQ  D:  09/05/2006  T:  09/06/2006  Job:  [id number]   cc:   Gordy Savers, MD

## 2010-07-29 NOTE — Discharge Summary (Signed)
NAMEALYCEA, SEGOVIANO NO.:  [id number]   MEDICAL RECORD NO.:  [id number]          PATIENT TYPE:  INP   LOCATION:  5524                         FACILITY:  [facility]   PHYSICIAN:  Valerie A. Felicity Coyer, MDDATE OF BIRTH:  [birth year]   DATE OF ADMISSION:  09/05/2006  DATE OF DISCHARGE:  09/08/2006                               DISCHARGE SUMMARY   DISCHARGE DIAGNOSES:  1. Severe left shoulder pain, in the setting of severe rotator cuff      tendinopathy/tendinosis and questionable underlying bursitis.  2. Atypical chest pain with low probability VQ scan for PE.  Consider      outpatient stress test.  3. Asthma, controlled on Advair.  4. Chronic low back pain, at baseline.  5. Hypertension.   HISTORY OF PRESENT ILLNESS:  Miranda [name] is a 65 year old female who  presented on September 05, 2006 with chief complaint of severe left shoulder  pain.  She also had some associated chest pain at that time.  She is  admitted for further evaluation and treatment.   PAST MEDICAL HISTORY:  1. History of cardiac catheterization 2 years prior to this admission;      reportedly negative.  2. Chronic low back pain.  3. Hypertension.  4. Asthma.  5. Dyslipidemia.  6. Anxiety.   COURSE OF HOSPITALIZATION:  PROBLEM #1 - LEFT SHOULDER PAIN.  The  patient was admitted and orthopedic consult was obtained.  The patient  was seen in consultation by Dr. Turner Daniels.  MRI was performed during this  admission, which noted severe rotator cuff tendinopathy/tendinosis --  without any discrete tear.  There was also a question of some underlying  bursitis.  She did undergo an injection, per orthopedics during this  admission, with some relief.  She was seen by physical therapy and  exercises were recommended.  She will be sent home with a prescription  for Percocet.  She is instructed not to drive while using pain  medications.  She currently has her left arm in a sling.  The plan is to  discharge the patient  to home, with outpatient follow-up with Dr. Turner Daniels.   MEDICATIONS AT TIME OF DISCHARGE:  1. Toprol XL 100 mg p.o. daily.  2. Protonix 40 mg p.o. daily.  3. Benicar/HCTZ 20/12.5 one tablet p.o. daily.  4. Advair 250/50 one puff twice daily.  5. Percocet 7.5/325 1 tablet p.o. q.4 h p.r.n. pain.  6. Crestor 20 mg p.o. daily.   PERTINENT LABORATORIES:  At time of discharge:  BUN 9, creatinine 0.81,  hemoglobin 13.3, hematocrit 40.9.   DISPOSITION:  The patient will be discharged to home.   FOLLOWUP:  The patient is instructed to follow up with Dr. Eleonore Chiquito on Monday, June 30 at 1:15 p.m.      Sandford Craze, NP      Raenette Rover. Felicity Coyer, MD  Electronically Signed    MO/MEDQ  D:  09/08/2006  T:  09/08/2006  Job:  [id number]   cc:   Gordy Savers, MD

## 2010-08-01 NOTE — Op Note (Signed)
[place]  Patient:    Miranda [name], Miranda [name]                      MRN: [id number] Proc. Date: 09/16/99 Adm. Date:  [id number] Attending:  Drema Pry CC:         Jearld Adjutant, M.D.             Tammy R. Collins Scotland, M.D., [facility]             Elizabeth Palau, FNP, [facility]                           Operative Report  PREOPERATIVE DIAGNOSES: 1. Right heel Haglund deformity. 2. Extensive calcific tendinitis of Achilles attachment. 3. Right chronic plantar fasciitis with large spur. 4. Left chronic heel plantar fasciitis.  POSTOPERATIVE DIAGNOSES: 1. Right heel Haglund deformity. 2. Extensive calcific tendinitis of Achilles attachment. 3. Right chronic plantar fasciitis with large spur. 4. Left chronic heel plantar fasciitis.  PROCEDURES: 1. Right endoscopically-assisted plantar fascial release. 2. Open right Haglund deformity pump bump excision - partial ostectomy of the    posterior-superior os calsis tuberosity. 3. Debridement of calcific tendinitis fragment and attachment and Achilles    tendon. 4. Endoscopically-assisted left plantar fascial release.  SURGEON:  Jearld Adjutant, M.D.  ASSISTANT:  Currie Paris. Bethune, P.A.C.  ANESTHESIA:  General endotracheal.  CULTURES:  None.  DRAINS:  None.  ESTIMATED BLOOD LOSS:  Minimal.  TOURNIQUET TIME:  Right: 14 minutes. Left: for the plantar fascial release 17 minutes and for the pump bump excision and debridement of the Achilles tendon 60 minutes.  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 referred at the request of Elizabeth Palau, Family Nurse Practitioner at [facility], as well as, Dr. Herb Grays with complaint of bilateral heel pain, both on the plantar aspect of the heels and posteriorly for a year.  She saw another orthopedist in town with plantar fascia type symptoms  and had several cortisone shots in both heels in the plantar fascia would help temporarily.  She also went through a course of physical therapy.  She went through a physical therapy program at the [facility] and stated, that it did help a bit, but the heels have continued to bother her.  She says she has to walk on her toes.  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  This is especially true after sitting for an hour or so.  The pain has gotten progressively worse to the point, where she is having difficulty ambulating.  She states, that her pain is constant and is severe, it is sharp and throbbing at times.  She does get associated swelling.  It is getting worse and it keeps her awake from sleep. She has been taking some Vicodin.  Examination revealed marked tenderness over bilateral plantar fascial insertions on the os calsis, as well as, the posterior Achilles tendon insertion on the os calsis especially on the right. She has a palpable pump bump bilaterally.  She was minimally tender along the mid substance of the Achilles tendon.  The right side was more tender than the left.  Achilles  tendons were intact bilaterally and she had obvious difficulty ambulating secondary to her heel pain.  Harris and lateral heel views show large plantar fascial spurs bilaterally, as well as, spurring posteriorly at the attachment of the Achilles tendon with a large Haglund deformities bilaterally.  At this point, because she had had so many previous treatments, it was felt that she was going to come to surgical intervention.  We put her on a Sterapred dose pack, a Cam walker and pain medication.  It was my feeling that the need to get up on her toes to help the plantar fasciitis was aggravating her Achilles tendinitis.  She did have  a large amount of calcific tendinitis in the right Achilles tendon insertion.  When she came back in September 12, 1999 she was miserable, wearing a Cam walker on the right and was ready for surgery.  At surgery today, we did endoscopically-assisted bilateral plantar fascial releases first in the prone position and she had very thick, gritty plantar fasciae both sides, which released nicely with the endoscopic technique, as well as, going through the portals as is my technique with a small osteotome afterward.  We did encounter on the right side the large spur. We made no attempt to remove, just release the plantar fascia off of it.  We released all the way across on both sides.  The right side was what we elected to do surgery on with posterior heel.  The left side not bothering her nearly as much and we took off a large prominent posterior-superior os calsis - Haglund deformity and then through a longitudinal incision midline in the distal Achilles tendon insertion, I shelled out sharply with a 15 blade and rongeur, the calcific deposit within the tendon and then I sutured the tendon back together side-to-side with Vicryl suture.  This left 80% to 85% of the tendon intact at its insertion.  PROCEDURE:  With adequate anesthesia obtained using endotracheal technique, 300 mg Cleocin given IV prophylaxis.  The patient was placed supine.  Both lower extremities were placed with leg holders on the upper calf.  Standard prepping and draping carried out on both feet.  On the right side, we then used an Esmarch and exsanguinated the foot and kept the Esmarch on above the ankle after the tourniquet was let up to 350 mmHg.  I then made an incision medially 3 cm up from the bottom of the foot and 5 cm anterior from the posterior heel as plotted with the coordinates of the plantar fascial origin. Incision was made medially and spread with a hemostat, then we took in the gold-handled trocar across between  the undersurface of the os calcis and the plantar fascia on top of the plantar fascia and then the trocar out the other  side through a nick.  I then brought in the camera, looked down upon the plantar fascia and released from on top with a triangular blade knife.  When I was satisfied with that portion of the release, I then repositioned the trocar underneath the plantar fascia and cut upward.  I then took the small osteotome and further scraped off the plantar fascia from both sides any remaining strands.  Irrigation was carried out, both wounds were closed with 4-0 nylon and a bulky sterile compressive dressing was applied and the tourniquet let down.  Attention was then turned to the left-hand side, where the exact same procedure was performed with again, the finding of very thick, gritty plantar fascia and  good release was obtained.  A dressing was placed on that side after 0.5% Marcaine was injected in the heel.  We then placed a bulky, compressive dressing with sterile Ace.  The patient then was rolled to another operating room table into the prone position on chest rolls with appropriate padding.  Leaving the left ankle and heel with its bandage from the plantar fascial release, the right side was reprepped and draped with Duraprep and tourniquet was let up after Esmarch again to 350 mmHg.  I then made a transverse incision in longer skin lines obliquely across the posterior heel to gain access to the medial and lateral side, as well as, the midline. Incision was deepened sharply in the midline and hemostasis obtained using the Bovie electrocoagulator.  Care was taken to minimize flap dissection with a knife.  Then, from the lateral to medial side, I dissected down to the posterolateral Haglund posterior-superior os calsis tuberosity and then went across that with an osteotome taking off the posterior-superior triangular corner.  It was smoothed with an osteotome on both sides.  I  then took off some osteophytes on each corner medially and laterally.  I then made a longitudinal incision where the obvious tendinopathy and tendon calcification was noted and this measured about a cm x 1.5 cm with a thin layer in the mid substance of the distal Achilles tendon.  This was dissected sharply out using initially a midline Achilles tendon incision and with the tip of the 15 blade, this was removed.  When I was all the way around it, the distal end was clipped out with a rongeur.  I then sutured side-to-side the tendon where we had removed this calcification with horizontal mattress sutures of #1 Vicryl. The subcu was closed with 3-0 Vicryl.  The skin was closed with running interrupted vertical mattress sutures of 4-0 nylon to evert the skin and we also put 0.5% Marcaine in and about the heel wounds.  A bulky sterile compressive dressing was applied underneath a Cam walker in neutral position. The patient was then rolled back to the supine position, awaken and extubated and taken to the recovery room in satisfactory condition.  She did not want to spend the night.  We will see how she does in the recovery room with respect to pain, but plan to send her home on a walker after crutches initially with weightbearing as tolerated and come back to the office on Friday for recheck. DD:  09/16/99 TD:  09/16/99 Job: [id number]

## 2010-08-01 NOTE — Consult Note (Signed)
Miranda [name], LYLES NO.:  [id number]   MEDICAL RECORD NO.:  [id number]          PATIENT TYPE:  INP   LOCATION:  0352                         FACILITY:  [facility]   PHYSICIAN:  Rollene Rotunda, M.D.   DATE OF BIRTH:  02-13-1946   DATE OF CONSULTATION:  DATE OF DISCHARGE:                                   CONSULTATION   PRIMARY CARE PHYSICIAN:  Maryelizabeth Rowan, M.D.   REFERRING PHYSICIAN:  Isidor Holts, M.D.   REASON FOR CONSULTATION:  Patient with chest pain and heart failure on chest  x-ray.   HISTORY OF PRESENT ILLNESS:  Patient is a very pleasant 65 year old African-  American female.  She is a Psychologist, sport and exercise at [employer].  She has had no  prior cardiac history but has been worked up recently for dyspnea and has an  ongoing evaluation by Dr. Jayme Cloud.  As part of this, she was sent for a  lung stress test.  I suspect this was a CPX test.  She said she had done  the breathing part but had not gotten on the exercise equipment.  At the  end of the breathing part, she developed chest discomfort.  This is  similar to previous episodes but more severe.  She had some substernal  discomfort that radiated to her breast.  She was dizzy and weak.  It is  apparently short-lived discomfort.  There is some radiation to her left arm  but none to her neck.  It is sharp.  It was not associated with nausea,  vomiting, or diaphoresis.  She presented to the emergency room, where she  was noted to be dizzy and lightheaded-appearing.  It appears that she was  slightly hypertensive in the emergency room, and there were no decreased  oxygen saturations.  Her cardiac point-of-care markers were normal, as was  her EKG.  She did have some mild vascular congestion/edema on chest x-ray.  She was treated with IV diuretics.   The patient reports that she has had chest discomfort for about a month.  It  is sharp and shooting.  It is sporadic and not inducible with exertion.  She  thinks  it may be getting more frequent.  She does not describe classic  substernal chest pain or neck discomfort that is persistent with a pressing  nature.  She does have some nights when she sleeps on some pillows, but this  is not consistent.  She has some nights when she wakes up in a panic, like  she cannot breathe, but most of the time she can lie flat in bed.  She does  have a diagnosis of sleep apnea.   PAST MEDICAL HISTORY:  1.  Obstructive sleep apnea.  Recent dyspnea workup per Dr. Jayme Cloud.  2.  Gastroesophageal reflux disease.  3.  Hypertension for years.   PAST SURGICAL HISTORY:  1.  Bilateral heel surgeries.  2.  Hysterectomy.   ALLERGIES:  1.  PENICILLIN.  2.  ASPIRIN.  3.  SULFA.  4.  IBUPROFEN.  5.  LATEX.   MEDICATIONS:  1.  Toprol  XL 100 mg a day.  2.  Aciphex.  3.  Zyrtec.   SOCIAL HISTORY:  Patient is married.  She has four living children.  She has  never smoked cigarettes.  She does not drink alcohol.   FAMILY HISTORY:  Contributory for a mother apparently dying in her 38s of a  myocardial infarction.  Her sister died of a myocardial infarction in her  64s.   REVIEW OF SYSTEMS:  As stated in the HPI, otherwise negative for other  systems.   PHYSICAL EXAMINATION:  VITAL SIGNS:  Blood pressure 146/92, heart rate 82  and regular.  Afebrile.  Saturation 97% on 2 liters.  GENERAL:  The patient is in no distress.  HEENT:  Eyes unremarkable.  Pupils are equal, round and reactive to light.  Fundi not visualized.  Oral mucosa unremarkable.  NECK:  No jugular venous distention.  Wave form within normal limits.  Carotid upstrokes brisk and symmetrical.  No carotid bruits.  No  thyromegaly.  LYMPHATICS:  No cervical, axillary, or inguinal adenopathy.  LUNGS:  Clear to auscultation bilaterally.  BACK:  No costovertebral angle tenderness.  CHEST:  Unremarkable.  HEART:  PMI nondisplaced or sustained.  S1 and S2 within normal limits with  no S3, S4, or murmurs.   ABDOMEN:  Flat, positive bowel sounds.  Normal in frequency, pitch, count,  bruits.  No rebound, guarding.  No midline pulses.  No masses or  organomegaly.  SKIN:  No rashes or nodules.  EXTREMITIES:  Pulses 2+ with no edema.  No clubbing or cyanosis.  NEUROLOGIC:  Oriented to person, place, and time.  Cranial nerves II-XII  grossly intact.  Motor grossly intact.   EKG:  Sinus rhythm.  Rate 69.  Axis within normal limits.  Intervals within  normal limits.  Poor entry R wave progression.  No acute ST/T wave changes.   LABS:  Sodium 141, potassium 3.3, BUN 14, creatinine 0.9, point-of-care  markers negative x2.  Other labs pending.   ASSESSMENT/PLAN:  Chest discomfort:  The patient's chest discomfort is  atypical; however, she does have significant cardiovascular risk factors.  The only objective findings is some mild edema on chest x-ray.  At this  point, we will check an echocardiogram, BNP level, and cardiac enzymes.  If  any of these are abnormal, I would  suggest right and left heart catheterization; however, if all of the above  are normal, I think we can safely proceed, given the atypical nature of the  symptoms, and then a relatively low pretest probability, with stress  perfusion imaging,  She would not be able to exercise because of her dyspnea  and would need an Adenosine Cardiolite.      JH/MEDQ  D:  05/08/2004  T:  05/08/2004  Job:  [id number]   cc:   Maryelizabeth Rowan, M.D.  Fax: [phone]   Isidor Holts, M.D.

## 2010-08-01 NOTE — Discharge Summary (Signed)
Miranda [name], Miranda [name] NO.:  [id number]   MEDICAL RECORD NO.:  [id number]          PATIENT TYPE:  INP   LOCATION:  0352                         FACILITY:  [facility]   PHYSICIAN:  Danae Chen, M.D.DATE OF BIRTH:  05/12/1945   DATE OF ADMISSION:  05/08/2004  DATE OF DISCHARGE:  05/10/2004                                 DISCHARGE SUMMARY   DISCHARGE DIAGNOSES:  1.  Atypical chest pain.  2.  Hypertension.  3.  Dyslipidemia.  4.  Obstructive sleep apnea.  5.  Obesity.  6.  Reflux disease.   DISCHARGE MEDICATIONS:  1.  Crestor 10 mg p.o. daily.  2.  Prinivil 10 mg p.o. daily.  3.  Toprol XL 100 mg p.o. daily.  4.  Hydrochlorothiazide 12.5 mg p.o. daily.  5.  Aspirin 81 mg p.o. daily.  6.  Protonix 40 mg p.o. p.r.n.  7.  Tylenol/ibuprofen as needed for osteoarthritis.  8.  Zyrtec 10 mg p.o. p.r.n.   CONSULTATIONS:  [place] [facility].   PROCEDURES:  1.  A 2-D echocardiogram showing normal left ventricular ejection fraction      50-55%.  No other significant abnormalities noted.  2.  Adenosine Cardiolite performed on May 10, 2004.  These results are      still pending at this time.   BRIEF HOSPITAL COURSE:  The patient is a 65 year old African-American female  who was referred by her pulmonologist, Dr. Jayme Cloud, for an exercise stress  test because of some dyspnea on exertion.  During that time she had  complained of shortness of breath, some chest discomfort.  She was then  transferred for admission for further evaluation.  She was admitted.  Serial  cardiac enzymes were obtained.  Cardiology consult was also obtained.  Cardiac enzymes were negative.  Her 2-D echocardiogram was normal and a BNP  level also was normal at 36.  Given her atypical nature of chest discomfort,  but also the fact that she had other risk factors, restratification test was  needed and adenosine Cardiolite was performed.  At the time of this  dictation the complete results  are not back so there is a chance that she  may not be going if there is some ischemia that is found, but if this test  is normal then she is ready to go home.   LABORATORY DATA:  As noted, cardiac enzymes, troponins, CK, CK-MBs negative.  BNP 36.  Cholesterol panel showed a total cholesterol that was elevated at  221, HDL 42, LDL 156.   EKG on admission showed sinus bradycardia.  No significant ST/T-wave  elevation or changes.   FOLLOW UP:  The patient is to follow up with her primary care physician, Dr.  Duanne Guess, in 1-2 weeks' time.  We will check baseline LFTs as the Crestor  statin medication is a new drug for her.  Her LDL goal should be 100 or  less.  It is now at 159 as noted.  She should have periodic LFTs monitored  while she is on this medication.  The patient understands this.   MEDICATIONS:  She  is to continue her other home medications as noted  including her Advair Diskus as well.  Also at the time of this dictation the  patient is on Plavix as well as aspirin as Cardiolite is negative.  Recommend that she be discharged home on aspirin alone.  She is also on a  beta blocker, ACE inhibitor, and a diuretic.   CONDITION ON DISCHARGE:  At the time of this dictation is improved.  Vital  signs are stable.  She is ambulating.  Blood pressure is 95/50, pulse 57,  and she is afebrile.  Lungs are clear.  Heart rate is regular.  She has no  peripheral edema.   DISPOSITION:  The patient is ready for discharge from primary standpoint.  Cardiology will reassess with the results of her Cardiolite and if these are  normal as noted, she will be discharged.  If there are some other issues  regarding her Cardiolite, further management then per cardiology.      RLK/MEDQ  D:  05/10/2004  T:  05/10/2004  Job:  [id number]

## 2010-08-01 NOTE — H&P (Signed)
Miranda [name], HAMOR NO.:  [id number]   MEDICAL RECORD NO.:  [id number]          PATIENT TYPE:  EMS   LOCATION:  ED                           FACILITY:  [facility]   PHYSICIAN:  Isidor Holts, M.D.  DATE OF BIRTH:  June 17, 1945   DATE OF ADMISSION:  05/08/2004  DATE OF DISCHARGE:                                HISTORY & PHYSICAL   PRIMARY CARE PHYSICIAN:  [facility], Maryelizabeth Rowan, M.D.   PULMONARY:  Danice Goltz, M.D.   CHIEF COMPLAINT:  Shortness of breath and chest pain for approximately one  month.   HISTORY OF PRESENT ILLNESS:  This is a 65 year old female with known history  of hypertension, obesity and obstructive sleep apnea. She has been having  shortness of breath on exertion, for approximately one month now and she  actually has trouble walking up approximately two flights of stairs.  She  has noticed progressive mild ankle swelling for the same period of time.  For the past two weeks, however, she has had recurrent episodes of left  inframammary chest pain, radiating to her left shoulder and arm associated  with shortness of breath, nausea, and diaphoresis.  This usually only lasts  for a few seconds.  The patient has been seeing Dr. Jayme Cloud in pulmonary,  for a while now, and was diagnosed with obstructive sleep apnea following a  sleep study in June 2005.  She went to see Dr. Jayme Cloud about one week ago,  and at that time a stress test was arranged for May 08, 2004.  She had  just commenced the stress test today, when she became short of breath and  had to be sent to the emergency department.   PAST MEDICAL HISTORY:  1.  Obesity.  2.  Obstructive sleep apnea syndrome, confirmed by sleep study in June 2005.  3.  Hypertension.  4.  Gastroesophageal reflux disease.  5.  Status post orthopaedic surgery for bilateral heel plantar fasciitis,      September 16, 1999.   MEDICATIONS:  1.  Toprol XL 100 mg p.o. daily.  2.  Aciphex,  unknown dosage.  3.  Zyrtec 10 mg p.o. p.r.n.  4.  Advair diskus.   ALLERGIES:  1.  PENICILLIN.  2.  ASPIRIN.  3.  SULFA.  4.  IBUPROFEN.  5.  SEAFOOD.   SOCIAL HISTORY:  The patient is married, lives with her spouse, does not  smoke, does not drink, has no history of drug abuse.  She has four offspring  all alive and well.   FAMILY HISTORY:  She had three brothers, two of whom died from colon cancer  in their 40s.  She had seven sisters;  one died status post MI, another died  from bowel cancer.  Her mother is deceased, status post MI.  Her father is  also deceased, although the patient does not know what the cause was.   REVIEW OF SYSTEMS:  As in HPI.  In addition, the patient admits to getting  symptoms suggestive of paroxysmal nocturnal dyspnea occasionally.   PHYSICAL EXAMINATION:  VITAL SIGNS:  Temperature  97.5, pulse 82 per minute  and regular, respiratory rate 16, blood pressure 146/93 mmHg, pulse oximetry  97% on 2 L.  GENERAL:  The patient was initially comfortable, not short of breath after  intravenous Lasix in the emergency department, but during the physical  examination, she had one episode of chest pain promptly relieved by  sublingual nitroglycerin.  HEENT:  No clinical pallor, no jaundice.  Throat appears quite clear.  NECK:  Supple, JVP not seen, no palpable lymphadenopathy, no palpable  goiter, no carotid bruits.  CHEST:  Clear to auscultation.  No wheezes, no crackles.  HEART:  S1 and S2 heard, normal, regular, no murmurs.  ABDOMEN:  Obese, soft, nontender.  There is no palpable organomegaly, no  palpable masses.  Normal bowel sounds.  EXTREMITIES:  Lower extremity examination:  Minimal edema.  MUSCULOSKELETAL:  Not formally examined.  NEUROLOGIC:  There are no neurologic deficits on gross examination.   LABORATORY DATA:  Electrolytes:  Sodium 141, potassium 3.3, chloride 106,  CO2 30, BUN 14, creatinine 0.9.  Troponin-I less than 0.05.  Chest x-ray   shows mild CHF.  EKG shows sinus rhythm, 69 beats per minute and regular,  normal axis, no acute ischemic changes.   ASSESSMENT AND PLAN:  1.  Congestive heart failure.  Admit the patient for telemetric monitoring,      manage with diuretics, beta blockers, oxygen, and ACE inhibitors.  We      shall also arrange an echocardiogram.   1.  Recurrent chest pain, rule out coronary artery disease.  We will manage      with nitrates, Plavix, as the patient is allergic to aspirin.  We will      cycle cardiac enzymes and EKG.  We shall also arrange for cardiology      consultation.  She will likely need stress test versus cardiac      catheterization.  This will be per cardiology recommendations.   1.  Obstructive sleep apnea syndrome.  Stable.   1.  Hypertension, uncontrolled.  This should respond to diuretics and ACE      inhibitor.  We shall continue the patient's beta blocker and continue to      monitor.   1.  Gastroesophageal reflux disease.  We will manage with proton pump      inhibitor.   We will institute deep venous thrombosis prophylaxis with heparin.  Further management will depend on clinical course.      CO/MEDQ  D:  05/08/2004  T:  05/08/2004  Job:  [id number]   cc:   Maryelizabeth Rowan, M.D.  Fax: [phone]   Danice Goltz, M.D. [facility]

## 2010-08-01 NOTE — Cardiovascular Report (Signed)
NAMEMALOREE, UPLINGER NO.:  [id number]   MEDICAL RECORD NO.:  [id number]          PATIENT TYPE:  OIB   LOCATION:  6501                         FACILITY:  [facility]   PHYSICIAN:  Charlies Constable, M.D. [facility] DATE OF BIRTH:  [birth year]   DATE OF PROCEDURE:  05/22/2004  DATE OF DISCHARGE:                              CARDIAC CATHETERIZATION   CLINICAL HISTORY:  Mrs. 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  She has a history of hypertension, hyperlipidemia and obstructive  sleep apnea.  She recently has been seen by Dr. Marcos Eke and was seen in  consultation by Dr. Antoine Poche for evaluation of chest pain and dyspnea.  She  had an echocardiogram which showed an ejection fraction of 50-55%, which was  normal.  She had an adenosine Cardiolite scan which showed an ejection  fraction of 68% and there was a suggestion of ischemia in the anterolateral  apex.  For this reason, she was scheduled for evaluation with angiography.   PROCEDURES:  1.  Right heart catheterization was performed percutaneously via the right      femoral vein using a mini sheath and Swan-Ganz thermodilution catheter.  2.  Left heart catheterization was performed percutaneously via the right      femoral artery using arterial sheath and 4 French preformed coronary      catheters.  An arterial puncture was performed and Omnipaque contrast      was used.  3.  A distal aortogram was performed to rule out renovascular causes for      hypertension.   The patient tolerated the procedure well and left the laboratory in  satisfactory condition.   RESULTS:  HEMODYNAMIC DATA:  The right atrial pressure was 10 mean.  The  pulmonary artery pressure was 46/18 with a mean of 30.  Pulmonary wedge  pressure was 15 mean.  The left ventricular pressure was 156/15 and the  aortic pressure was 156/78 with a mean of 108.  Cardiac output/cardiac index  was 7.2/3.5 L per min/sq q by Fick.   ANGIOGRAPHIC  DATA:   Left main coronary artery:  The left main coronary artery was free of  significant disease.   Left anterior descending artery:  The left anterior descending artery gave  rise to a large septal perforator, two small septal perforators and two  diagonal branches.  The LAD wrapped around the apex and supplied part of the  inferior septum.  These vessels were free of significant disease.   Left circumflex artery:  The left circumflex artery gave rise to a marginal  branch, an atrial branch and a posterolateral branch.  The marginal branch  and posterolateral branch supplied the inferior septum.  These vessels were  free of significant disease.   Right coronary artery:  The right coronary artery was a small vessel despite  a conus branch, a right ventricular branch and a very small posterior  descending branch.  These vessels were free of significant disease.   The left ventriculogram performed in the RAO projection showed good wall  motion with no  areas of hypokinesis.  The estimated ejection fraction was  55%.   The distal aortogram was performed, but this was underpacified due to  inadequate contrast.  The left renal artery appeared to be patent and the  right could not be visualized because of inadequate contrast.  There did not  appear to be any major obstruction in the aorta and no aortic aneurysm.   CONCLUSION:  1.  Normal coronary angiography and left ventricular wall motion.  2.  Moderate elevation of the pulmonary artery pressure.   RECOMMENDATIONS:  Based on this study, I do not see any cardiac etiology for  the patient's chest pain and dyspnea.  She does have moderate elevation of  pulmonary artery pressures.  Will plan followup with Dr. Marcos Eke regarding  further evaluation.  Will also have the patient follow up with Dr. Duanne Guess.      BB/MEDQ  D:  05/22/2004  T:  05/22/2004  Job:  [id number]   cc:   Rollene Rotunda, M.D.   Maryelizabeth Rowan, M.D.  Fax:  [phone]   Danice Goltz, M.D. [facility]

## 2010-08-29 ENCOUNTER — Inpatient Hospital Stay (HOSPITAL_COMMUNITY)
Admission: EM | Admit: 2010-08-29 | Discharge: 2010-08-31 | DRG: 392 | Disposition: A | Discharge status: Home / Self Care | Payer: Medicare Other | Attending: Internal Medicine | Admitting: Internal Medicine

## 2010-08-29 ENCOUNTER — Inpatient Hospital Stay (HOSPITAL_COMMUNITY): Payer: Medicare Other

## 2010-08-29 ENCOUNTER — Emergency Department (HOSPITAL_COMMUNITY): Payer: Medicare Other

## 2010-08-29 DIAGNOSIS — R1013 Epigastric pain: Secondary | ICD-10-CM | POA: Diagnosis present

## 2010-08-29 DIAGNOSIS — K5900 Constipation, unspecified: Secondary | ICD-10-CM | POA: Diagnosis present

## 2010-08-29 DIAGNOSIS — R209 Unspecified disturbances of skin sensation: Secondary | ICD-10-CM | POA: Diagnosis present

## 2010-08-29 DIAGNOSIS — R55 Syncope and collapse: Secondary | ICD-10-CM | POA: Diagnosis not present

## 2010-08-29 DIAGNOSIS — I498 Other specified cardiac arrhythmias: Secondary | ICD-10-CM | POA: Diagnosis present

## 2010-08-29 DIAGNOSIS — R0789 Other chest pain: Secondary | ICD-10-CM | POA: Diagnosis present

## 2010-08-29 DIAGNOSIS — F411 Generalized anxiety disorder: Secondary | ICD-10-CM | POA: Diagnosis present

## 2010-08-29 DIAGNOSIS — K219 Gastro-esophageal reflux disease without esophagitis: Principal | ICD-10-CM | POA: Diagnosis present

## 2010-08-29 DIAGNOSIS — E785 Hyperlipidemia, unspecified: Secondary | ICD-10-CM | POA: Diagnosis present

## 2010-08-29 DIAGNOSIS — J45909 Unspecified asthma, uncomplicated: Secondary | ICD-10-CM | POA: Diagnosis present

## 2010-08-29 DIAGNOSIS — Z8249 Family history of ischemic heart disease and other diseases of the circulatory system: Secondary | ICD-10-CM

## 2010-08-29 DIAGNOSIS — I10 Essential (primary) hypertension: Secondary | ICD-10-CM | POA: Diagnosis present

## 2010-08-29 DIAGNOSIS — G4733 Obstructive sleep apnea (adult) (pediatric): Secondary | ICD-10-CM | POA: Diagnosis present

## 2010-08-29 LAB — BASIC METABOLIC PANEL
CO2: 27 mEq/L (ref 19–32)
Chloride: 104 mEq/L (ref 96–112)
Potassium: 3.4 mEq/L — ABNORMAL LOW (ref 3.5–5.1)
Sodium: 141 mEq/L (ref 135–145)

## 2010-08-29 LAB — CARDIAC PANEL(CRET KIN+CKTOT+MB+TROPI)
CK, MB: 1.5 ng/mL (ref 0.3–4.0)
Relative Index: INVALID (ref 0.0–2.5)
Total CK: 62 U/L (ref 7–177)
Troponin I: <0.3 ng/mL (ref <0.30)

## 2010-08-29 LAB — DIFFERENTIAL
Eosinophils Absolute: 0.3 10*3/uL (ref 0.0–0.7)
Lymphocytes Relative: 38 % (ref 12–46)
Lymphs Abs: 2.4 10*3/uL (ref 0.7–4.0)
Monocytes Relative: 13 % — ABNORMAL HIGH (ref 3–12)
Neutrophils Relative %: 44 % (ref 43–77)

## 2010-08-29 LAB — TROPONIN I: Troponin I: <0.3 ng/mL (ref <0.30)

## 2010-08-29 LAB — CBC
Hemoglobin: 13.2 g/dL (ref 12.0–15.0)
MCH: 26.6 pg (ref 26.0–34.0)
MCV: 82.5 fL (ref 78.0–100.0)
RBC: 4.96 MIL/uL (ref 3.87–5.11)

## 2010-08-30 DIAGNOSIS — R079 Chest pain, unspecified: Secondary | ICD-10-CM

## 2010-08-30 DIAGNOSIS — I517 Cardiomegaly: Secondary | ICD-10-CM

## 2010-08-30 LAB — PRO B NATRIURETIC PEPTIDE: Pro B Natriuretic peptide (BNP): 61.6 pg/mL (ref 0–125)

## 2010-08-30 LAB — CARDIAC PANEL(CRET KIN+CKTOT+MB+TROPI)
Relative Index: INVALID (ref 0.0–2.5)
Total CK: 60 U/L (ref 7–177)
Troponin I: <0.3 ng/mL (ref <0.30)

## 2010-08-30 LAB — CBC
MCH: 26.5 pg (ref 26.0–34.0)
MCV: 83.6 fL (ref 78.0–100.0)
Platelets: 175 10*3/uL (ref 150–400)
RBC: 4.99 MIL/uL (ref 3.87–5.11)

## 2010-08-30 LAB — DIFFERENTIAL
Eosinophils Absolute: 0.4 10*3/uL (ref 0.0–0.7)
Eosinophils Relative: 7 % — ABNORMAL HIGH (ref 0–5)
Lymphs Abs: 2.4 10*3/uL (ref 0.7–4.0)
Monocytes Relative: 11 % (ref 3–12)
Neutrophils Relative %: 36 % — ABNORMAL LOW (ref 43–77)

## 2010-08-30 LAB — LIPID PANEL
Cholesterol: 190 mg/dL (ref 0–200)
HDL: 42 mg/dL (ref >39)
Total CHOL/HDL Ratio: 4.5 RATIO
Triglycerides: 131 mg/dL (ref <150)

## 2010-08-30 LAB — COMPREHENSIVE METABOLIC PANEL
AST: 15 U/L (ref 0–37)
Albumin: 3.7 g/dL (ref 3.5–5.2)
Calcium: 9.6 mg/dL (ref 8.4–10.5)
Chloride: 104 mEq/L (ref 96–112)
Creatinine, Ser: 0.6 mg/dL (ref 0.50–1.10)
Total Bilirubin: 0.4 mg/dL (ref 0.3–1.2)
Total Protein: 7.2 g/dL (ref 6.0–8.3)

## 2010-08-30 LAB — PHOSPHORUS: Phosphorus: 3.2 mg/dL (ref 2.3–4.6)

## 2010-08-31 LAB — BASIC METABOLIC PANEL
CO2: 26 mEq/L (ref 19–32)
Calcium: 9.4 mg/dL (ref 8.4–10.5)
Chloride: 105 mEq/L (ref 96–112)
Creatinine, Ser: 0.67 mg/dL (ref 0.50–1.10)
Glucose, Bld: 119 mg/dL — ABNORMAL HIGH (ref 70–99)

## 2010-08-31 LAB — CBC
Hemoglobin: 12.8 g/dL (ref 12.0–15.0)
MCH: 27.1 pg (ref 26.0–34.0)
MCV: 84.1 fL (ref 78.0–100.0)
RBC: 4.73 MIL/uL (ref 3.87–5.11)

## 2010-08-31 NOTE — Discharge Summary (Signed)
Miranda [name] NO.:  [id number]  MEDICAL RECORD NO.:  [id number]  LOCATION:  1422                         FACILITY:  [facility]  PHYSICIAN:  Andreas Blower, MD       DATE OF BIRTH:  [birth year]  DATE OF ADMISSION:  08/29/2010 DATE OF DISCHARGE:                              DISCHARGE SUMMARY   PRIMARY CARE PHYSICIAN:  Gordy Savers, MD  CONSULTATION:  Dr. [place] from [facility].  DISCHARGE DIAGNOSES: 1. Atypical chest pain, likely due to gastroesophageal reflux disease     with perhaps a component of musculoskeletal pain. 2. Near syncope likely due to blood pressure medications, resolved. 3. Asthma. 4. Anxiety. 5. Hypertension. 6. Hyperlipidemia. 7. Gastroesophageal reflux disease. 8. Constipation. 9. History of obstructive sleep apnea. 10.History of chronic low back pain from disk prolapse.  DISCHARGE MEDICATIONS: 1. Benazepril 40 mg p.o. daily. 2. Docusate 100 mg p.o. twice daily as needed for constipation, hold     if any diarrhea. 3. Oxycodone 5 mg 1 to 2 tablets every 6 hours as needed for pain.     The patient given total of 20 tablets. 4. Senna 8.6 mg p.o. twice daily as needed for constipation, hold if     any diarrhea. 5. Metoprolol change to 12.5 mg p.o. twice daily. 6. Advair Diskus 250/50 one puff twice daily. 7. Cyclobenzaprine 10 mg p.o. 3 times a day. 8. Furosemide 40 mg p.o. daily 9. Ativan 0.4 mg p.o. twice daily as needed for anxiety. 10.Lovastatin 40 mg p.o. daily. 11.Meloxicam 7.5 mg p.o. twice daily. 12.Omeprazole 20 mg p.o. daily 13.Travoprost ophthalmic solution 0.004% one drop in both eyes daily. 14.Naproxen/esomeprazole 500/20 p.o. twice daily as needed for pain.  BRIEF ADMITTING HISTORY AND PHYSICAL:  Ms. Miranda [name] is a 65 year old African-American female with history of hypertension, asthma, hyperlipidemia, anxiety, GERD, who came in complaining about substernal chest pain which she has had for 1 week  intermittent.  RADIOLOGY/IMAGING:  The patient had chest x-ray two-view which showed stable cardiopulmonary appearance with low lung volumes, bibasilar volume loss and apical pulmonary vascular redistribution.  No new or acute abnormalities suggested.  The patient had a CT of chest without contrast which showed negative noncontrast CT.  LABORATORY DATA:  CBC shows a white count of 5, hemoglobin 12.8, hematocrit 39.8, platelet count 163.  Electrolytes normal with a BUN of 14, creatinine 0.63.  Liver function tests normal.  Hemoglobin A1c 6.5. Troponin negative x4.  BNP is 61.6.  LDL is 122.  TSH is 2.070.  Blood cultures x2 shows no growth to date.  HOSPITAL COURSE BY PROBLEM: 1. Atypical chest pain.  The patient was admitted and was ruled out     for acute coronary syndrome.  The patient reported that she has     been having left-sided chest pain.  It was thought that her chest     pain may have been due to GERD as the patient is taking lots of     NSAIDs.  Cardiology was consulted and Dr. Swaziland from [facility]     Cardiology evaluated the patient.  Dr. Swaziland did not think that     this was cardiac in  nature and if pain persistents, recommended     outpatient stress test.  The patient also had left-sided chest pain     that was reproducible on palpation, suspect that the patient has     musculoskeletal chest pain from perhaps muscular contusion.  Her     pain was managed by p.r.n. pain medication and improved during the     course of hospital stay.  The patient was instructed to follow with     her primary care physician; if has persistent chest pain, could     consider referral to [facility] for outpatient stress test. 2. Near syncope.  During the course of hospital stay, the patient had     near syncope.  Her blood pressure during the course of hospital     stay at one point was 98/61, as a result her antihypertensive     medications were adjusted.  The patient had her  hydrochlorothiazide     and amlodipine was held and her metoprolol was held as she was     bradycardic at times. The patient was instructed to follow up with     her primary care physician to see if she needs further titration     of antihypertensive medications. 3. Bradycardia.  During the course of hospital stay, the patient had     heart rate in the 50s and during the night has had heart rate drop     down to 40s.  As a result, her metoprolol dose was decreased from     50 twice daily to 12.5 twice daily.  Further titration of     metoprolol to be done as an outpatient by her primary care     physician. 4. Asthma, stable, not an active issue. 5. Anxiety, stable. 6. Hypertension, management as indicated above. 7. Hyperlipidemia.  Continue the patient on statin. 8. GERD.  Continue PPI. 9. Constipation, likely due to pain medications, was started on a     bowel regimen.  DISPOSITION AND FOLLOWUP:  The patient to follow up with Dr. Gordy Savers, her primary care physician, in 1 week.  Time spent on discharge, talking to the patient and coordinating care was 25 minutes.   Andreas Blower, MD   SR/MEDQ  D:  08/31/2010  T:  08/31/2010  Job:  [id number]  Electronically Signed by Wardell Heath Johan Antonacci  on 08/31/2010 05:06:24 PM

## 2010-09-04 NOTE — Consult Note (Signed)
NAMEKENSLEY, VALLADARES NO.:  [id number]  MEDICAL RECORD NO.:  [id number]  LOCATION:  1422                         FACILITY:  [facility]  PHYSICIAN:  Everley Evora M. Swaziland, M.D.  DATE OF BIRTH:  Jul 20, 1945  DATE OF CONSULTATION:  08/30/2010 DATE OF DISCHARGE:                                CONSULTATION   HISTORY OF PRESENT ILLNESS:  Ms. Burd is a 65 year old African-American female who we are asked to see by the hospitalist service for evaluation of chest pain.  She has a history of hyperlipidemia and hypertension. She presented with a 1-week history of substernal chest pain radiating to her arms.  The pain is described as a sharp stabbing quality pain that then becomes heavy.  It is associated with nausea and sweating. Her symptoms are worse with deep cough or breathing or with change in position.  She has had no shortness of breath.  She does report that since she has been hospitalized, her chest pain has improved.  The patient has been taking Naprosyn and Mobic at home for arthritic pain. The patient has had prior cardiac evaluation in 2006.  At that time, a nuclear stress test in the hospital suggested anterolateral ischemia. She subsequently had a right and left heart catheterization which showed normal coronary anatomy and normal right heart pressures.  She has had no further cardiac problems that time.  PAST MEDICAL HISTORY: 1. Hypertension. 2. Hyperlipidemia. 3. Asthma. 4. Anxiety. 5. Obstructive sleep apnea. 6. GERD. 7. Chronic back pain.  MEDICATIONS: 1. Amlodipine 5 mg daily. 2. Advair 1 puff b.i.d. 3. Furosemide 40 mg daily. 4. Protonix 40 mg b.i.d. 5. Crestor 5 mg daily. 6. Travatan 1 drop q.h.s. 7. Lopressor 50 mg b.i.d., is currently on hold.  Prior to admission she was taking Naprosyn and Mobic.  ALLERGIES:  Include PENICILLIN, ASPIRIN, SULFA, LATEX and TYLOX  SOCIAL HISTORY:  She denies tobacco or alcohol use.  She lives at home.  FAMILY  HISTORY:  Positive for myocardial infarction in her mother at age 11.  REVIEW OF SYSTEMS:  She has had protracted nausea without significant vomiting.  Bowel movements have been normal.  She denies any dysuria. She has no history of TIA or stroke.  She does have chronic back pain. All other systems were reviewed and are negative.  PHYSICAL EXAMINATION:  GENERAL:  On physical exam, she is a well- developed black female in no apparent distress. VITAL SIGNS:  Blood pressure is 128/76, pulse 55.  She does have some orthostatic changes this morning, she is afebrile.  Sats are 100% on room air. HEENT:  She is normocephalic, atraumatic.  Pupils are equal, round and reactive to light accommodation.  Extraocular movements were full. Oropharynx is clear. NECK:  Supple without JVD, adenopathy, thyromegaly, or bruits. LUNGS:  Clear. CARDIAC EXAM:  Reveals a regular rate and rhythm without gallop, murmur or click.  There is significant tenderness to palpation over the lower sternum, her sternal areas and in the epigastrium. ABDOMEN:  Her abdominal exam is otherwise negative.  Femoral and pedal pulses are 2+.  She has no edema. SKIN:  Warm and dry. NEUROLOGIC EXAM:  She is alert and oriented  x3.  Cranial nerves II through XII are intact.  LABORATORY DATA:  ECG x3 since admission shows normal sinus rhythm with no significant ST or T-wave changes.  Cardiac enzymes were negative x4. Chest x-ray showed low lung volumes but otherwise no active disease. Cranial CT scan was negative.  Cholesterol 190, LDL 122, triglycerides 131, HDL of 42, phosphorus is 3.2, magnesium 2.1.  Sodium 138, potassium 4.2, chloride 104, CO2 of 28, BUN 13, creatinine 0.6, glucose 108.  BNP level 61.6.  CBC is normal.  Echocardiogram is pending.  IMPRESSION: 1. Atypical chest pain with features of both musculoskeletal and     gastroesophageal pain.  I doubt that her pain is cardiac based on     her findings today and given  the fact she had a normal cardiac     catheterization in 2006. 2. Hypertension. 3. Hyperlipidemia. 4. Gastroesophageal reflux disease. 5. Asthma. 6. Obstructive sleep apnea.  PLAN:  I will review her echocardiogram today.  If this is unremarkable, then I would recommend continued treatment for noncardiac pain as you are doing.  If she continues to have persistent pain, we could arrange a stress nuclear study as an outpatient.          ______________________________ Amiel Mccaffrey M. [place], M.D.     PMJ/MEDQ  D:  08/30/2010  T:  08/30/2010  Job:  [id number]  cc:   Kathlen Mody, MD  Gordy Savers, MD [address] [place] [address]  Electronically Signed by Foxx Klarich Swaziland M.D. on 09/04/2010 07:43:36 AM

## 2010-09-05 LAB — CULTURE, BLOOD (ROUTINE X 2)
Culture  Setup Time: 201206160141
Culture: NO GROWTH
Culture: NO GROWTH

## 2010-09-08 NOTE — H&P (Signed)
[name], [name] NO.:  [id number]  MEDICAL RECORD NO.:  [id number]  LOCATION:  1422                         FACILITY:  [facility]  PHYSICIAN:  Kathlen Mody, MD       DATE OF BIRTH:  1945-05-16  DATE OF ADMISSION:  08/29/2010 DATE OF DISCHARGE:                             HISTORY & PHYSICAL   PRIMARY CARE PHYSICIAN:  Gordy Savers, MD  CHIEF COMPLAINT:  Chest pain since 1 week.  HISTORY OF PRESENT ILLNESS:  This is a 65 year old lady with history of hypertension, asthma, hyperlipidemia, anxiety, GERD, heartburn, came in complaining of substernal chest pain going on for about 1 week, intermittent.  This is worsening on deep breathing, not associated with activity, comes at rest and on physical activity.  Substernal chest pain is sharp-to-dull associated with tingling sensation in her right arm. Patient also had an episode of acute sharp pain yesterday associated with nausea and diaphoresis.  Patient had a cardiac catheterization in 2006, which showed normal coronary angiography with normal left ventricular function and slight elevation of pulmonary artery pressures. Patient denies any fevers.  She also reports shortness of breath associated with chest pain.  Denies any cough or fever.  Denies any syncope.  Denies any palpitations.  Denies orthopnea or PND.  Patient has a history of sleep apnea.  Patient denies any urinary complaints. Denies any headache or blurry vision.  Denies any focal weakness.  PAST MEDICAL HISTORY: 1. History of hypertension. 2. Asthma. 3. Hyperlipidemia. 4. Anxiety. 5. Chronic low back pain from disk prolapse. 6. Cardiac catheterization about in 2006, which showed normal     coronaries. 7. Obstructive sleep apnea. 8. GERD. 9. Heartburn.  HOME MEDICATIONS:  Please see med rec form for detailed medications and their doses.  FAMILY HISTORY:  History of MI in patient's mother around the age of 74.  SOCIAL HISTORY:   Patient lives at home.  Denies smoking, EtOH or recreational drug use.  PHYSICAL EXAMINATION:  VITAL SIGNS:  She is afebrile with blood pressure of 128/70, pulse of 68 per minute, respiration 18, saturating about 96% on 2 L of nasal cannula. GENERAL:  On exam, she is alert, afebrile, oriented x3, comfortable, in no acute distress. HEENT:  Pupils equal, reacting to light and accommodation.  No JVD. Moist mucous membranes. CARDIOVASCULAR:  S1, S2 heard.  No rubs, murmurs or gallops. RESPIRATORY:  Chest clear to auscultation bilaterally.  No wheezing or rhonchi. ABDOMEN:  Soft, nontender, nondistended.  Bowel sounds are heard. EXTREMITIES:  No pedal edema.  LABORATORY DATA:  Pertinent labs, patient had a CBC, which was within normal limits.  Basic metabolic panel significant for potassium of 3.4 and a glucose of 104.  First set of cardiac enzymes was negative.  DIAGNOSTIC STUDIES:  EKG normal sinus rhythm.  Radiology, patient had a PA lateral chest x-ray, shows stable cardiopulmonary appearance with low lung volumes, bibasilar volume loss, apical pulmonary vascular redistribution.  No focal or acute abnormalities suggested.  ASSESSMENT AND PLAN:  [age]-year-old lady with history of hypertension, asthma, hyperlipidemia, anxiety, complaining of atypical chest pain.  Patient is on lot of non-steroidal anti-inflammatory drugs. She is on Naprosyn, Meloxicam and  also history of heartburn.  I think this is mostly related to her heartburn, but she is on proton pump inhibitor 20 mg b.i.d. dosing, but we will admit the patient to telemetry, cycle her enzymes, get a 2-D echocardiogram, repeat an EKG in the morning.  We will also start the patient on Protonix 40 mg b.i.d. We will call cardiology in the morning as needed.  For deep venous thrombosis prophylaxis:  Subcu Lovenox.  For gastrointestinal GI prophylaxis:  Protonix 40 mg b.i.d.  Patient is full code.           ______________________________ Kathlen Mody, MD     VA/MEDQ  D:  08/29/2010  T:  08/29/2010  Job:  [id number]  Electronically Signed by Kathlen Mody MD on 09/08/2010 02:28:31 AM

## 2010-11-13 ENCOUNTER — Ambulatory Visit: Payer: Medicare Other | Admitting: Internal Medicine

## 2010-11-18 ENCOUNTER — Encounter: Payer: Self-pay | Admitting: Internal Medicine

## 2010-11-19 ENCOUNTER — Encounter: Payer: Self-pay | Admitting: Internal Medicine

## 2010-11-19 ENCOUNTER — Ambulatory Visit (INDEPENDENT_AMBULATORY_CARE_PROVIDER_SITE_OTHER): Payer: Self-pay | Admitting: Internal Medicine

## 2010-11-19 DIAGNOSIS — I10 Essential (primary) hypertension: Secondary | ICD-10-CM

## 2010-11-19 DIAGNOSIS — J45909 Unspecified asthma, uncomplicated: Secondary | ICD-10-CM

## 2010-11-19 DIAGNOSIS — K219 Gastro-esophageal reflux disease without esophagitis: Secondary | ICD-10-CM

## 2010-11-19 DIAGNOSIS — Z0000 Encounter for general adult medical examination without abnormal findings: Secondary | ICD-10-CM

## 2010-11-19 MED ORDER — OMEPRAZOLE 20 MG PO CPDR: 40.0000 mg | DELAYED_RELEASE_CAPSULE | Freq: Every day | ORAL | Status: DC

## 2010-11-19 NOTE — Patient Instructions (Addendum)
Avoids foods high in acid such as tomatoes citrus juices, and spicy foods.  Avoid eating within two hours of lying down or before exercising.  Do not overheat.  Try smaller more frequent meals.  If symptoms persist, elevate the head of her bed 12 inches while sleeping.  Limit your sodium (Salt) intake  Increase the omeprazole to twice daily  Schedule complete examination in 6 monthsAcid Reflux (GERD) Acid reflux is also called gastroesophageal reflux disease (GERD). Your stomach makes acid to help digest food. Acid reflux happens when acid from your stomach goes into the tube between your mouth and stomach (esophagus). Your stomach is protected from the acid, but this tube is not. When acid gets into the tube, it may cause a burning feeling in the chest (heartburn). Besides heartburn, other health problems can happen if the acid keeps going into the tube. Some causes of acid reflux include:  Being overweight.   Smoking.   Drinking alcohol.   Eating large meals.   Eating meals and then going to bed right away.   Eating certain foods.   Increased stomach acid production.  HOME CARE  Take all medicine as told by your doctor.   You may need to:   Lose weight.   Avoid alcohol.   Quit smoking.   Do not eat big meals. It is better to eat smaller meals throughout the day.   Do not eat a meal and then nap or go to bed.   Sleep with your head higher than your stomach.   Avoid foods that bother you.   You may need more tests, or you may need to see a special doctor.  GET HELP RIGHT AWAY IF:  You have chest pain that is different than before.   You have pain that goes to your arms, jaw, or between your shoulder blades.   You throw up (vomit) blood, dark brown liquid, or your throw up looks like coffee grounds.   You have trouble swallowing.   You have trouble breathing or cannot stop coughing.   You feel dizzy or pass out.   Your skin is cool, wet, and pale.   Your  medicine is not helping.  MAKE SURE YOU:    Understand these instructions.   Will watch your condition.   Will get help right away if you are not doing well or get worse.  Document Released: 08/19/2007 Document Re-Released: 05/27/2009 [facility] Patient Information 2011 [place], [place].

## 2010-11-19 NOTE — Progress Notes (Signed)
  Subjective:    Patient ID: Miranda [name], female    DOB: 15-May-1945, 65 y.o.   MRN: [id number]  HPI  65 year old patient who is seen today in followup. He was hospitalized for atypical chest pain in June. [facility]al records were reviewed this was felt to be gastro-esophageal reflux disease. She was evaluated by cardiology. She remains on omeprazole 20 mg daily. She continues to have severe reflux symptoms especially at night. She does adhere to a reasonable antireflux regimen. She has a history of hypertension and asthma as well as dyslipidemia. She does have a history of low back pain but apparently does not take any anti-inflammatory medications at this time;  she has been on naproxen and meloxicam in the past    Review of Systems  Constitutional: Negative.   HENT: Negative for hearing loss, congestion, sore throat, rhinorrhea, dental problem, sinus pressure and tinnitus.   Eyes: Negative for pain, discharge and visual disturbance.  Respiratory: Negative for cough and shortness of breath.   Cardiovascular: Negative for chest pain, palpitations and leg swelling.  Gastrointestinal: Positive for nausea and abdominal pain. Negative for vomiting, diarrhea, constipation, blood in stool and abdominal distention.  Genitourinary: Negative for dysuria, urgency, frequency, hematuria, flank pain, vaginal bleeding, vaginal discharge, difficulty urinating, vaginal pain and pelvic pain.  Musculoskeletal: Negative for joint swelling, arthralgias and gait problem.  Skin: Negative for rash.  Neurological: Negative for dizziness, syncope, speech difficulty, weakness, numbness and headaches.  Hematological: Negative for adenopathy.  Psychiatric/Behavioral: Negative for behavioral problems, dysphoric mood and agitation. The patient is not nervous/anxious.        Objective:   Physical Exam  Constitutional: She is oriented to person, place, and time. She appears well-developed and well-nourished.  HENT:  Head:  Normocephalic.  Right Ear: External ear normal.  Left Ear: External ear normal.  Mouth/Throat: Oropharynx is clear and moist.  Eyes: Conjunctivae and EOM are normal. Pupils are equal, round, and reactive to light.  Neck: Normal range of motion. Neck supple. No thyromegaly present.  Cardiovascular: Normal rate, regular rhythm, normal heart sounds and intact distal pulses.   Pulmonary/Chest: Effort normal and breath sounds normal.  Abdominal: Soft. Bowel sounds are normal. She exhibits no mass. There is no tenderness.  Musculoskeletal: Normal range of motion.  Lymphadenopathy:    She has no cervical adenopathy.  Neurological: She is alert and oriented to person, place, and time.  Skin: Skin is warm and dry. No rash noted.  Psychiatric: She has a normal mood and affect. Her behavior is normal.          Assessment & Plan:   Symptomatic gastroesophageal reflux disease. She replaced in a more aggressive anti-reflex regimen. The omeprazole be increased to a twice a day regimen. Hypertension stable Dyslipidemia. Will continue lovastatin. Asthma stable  Recheck in 3 months

## 2010-11-21 LAB — TB SKIN TEST: TB Skin Test: NEGATIVE mm

## 2010-11-21 NOTE — Progress Notes (Signed)
Addended by: Duard Brady I on: 11/21/2010 09:56 AM   Modules accepted: Orders

## 2010-12-31 LAB — URINALYSIS, ROUTINE W REFLEX MICROSCOPIC
Bilirubin Urine: NEGATIVE
Glucose, UA: NEGATIVE
Ketones, ur: NEGATIVE
Leukocytes, UA: NEGATIVE
Nitrite: NEGATIVE
Protein, ur: NEGATIVE
Specific Gravity, Urine: 1.025
Urobilinogen, UA: 0.2
pH: 6

## 2010-12-31 LAB — COMPREHENSIVE METABOLIC PANEL
ALT: 22
Alkaline Phosphatase: 51
BUN: 9
Chloride: 109
Glucose, Bld: 95
Potassium: 3.9
Sodium: 142
Total Bilirubin: 0.5
Total Protein: 7.3

## 2010-12-31 LAB — CBC
HCT: 40.9
Hemoglobin: 13.3
MCHC: 32.6
MCV: 82.7
Platelets: 193
RBC: 4.94
RDW: 14.6 — ABNORMAL HIGH
WBC: 6.1

## 2010-12-31 LAB — POCT CARDIAC MARKERS
CKMB, poc: 1.2
CKMB, poc: <1 — ABNORMAL LOW
CKMB, poc: <1 — ABNORMAL LOW
Myoglobin, poc: 33.5
Myoglobin, poc: 46.5
Myoglobin, poc: 62.4
Operator id: 270111
Operator id: 284141
Operator id: 284141
Troponin i, poc: <0.05
Troponin i, poc: <0.05
Troponin i, poc: <0.05

## 2010-12-31 LAB — COMPREHENSIVE METABOLIC PANEL WITH GFR
AST: 19
Albumin: 3.6
CO2: 25
Calcium: 9.5
Creatinine, Ser: 0.81
GFR calc Af Amer: >60
GFR calc non Af Amer: >60

## 2010-12-31 LAB — URINE MICROSCOPIC-ADD ON

## 2010-12-31 LAB — POCT I-STAT CREATININE
Creatinine, Ser: 0.9
Operator id: 284141

## 2010-12-31 LAB — I-STAT 8, (EC8 V) (CONVERTED LAB)
Acid-base deficit: 2
BUN: 14
Bicarbonate: 21.2
Chloride: 109
Glucose, Bld: 97
HCT: 46
Hemoglobin: 15.6 — ABNORMAL HIGH
Operator id: 284141
Potassium: 3.5
Sodium: 140
TCO2: 22
pCO2, Ven: 32 — ABNORMAL LOW
pH, Ven: 7.429 — ABNORMAL HIGH

## 2010-12-31 LAB — D-DIMER, QUANTITATIVE: D-Dimer, Quant: 0.65 — ABNORMAL HIGH

## 2010-12-31 LAB — TSH: TSH: 2.978

## 2011-02-06 ENCOUNTER — Emergency Department (HOSPITAL_COMMUNITY)
Admission: EM | Admit: 2011-02-06 | Discharge: 2011-02-06 | Disposition: A | Discharge status: Home / Self Care | Payer: Medicare Other | Attending: Emergency Medicine | Admitting: Emergency Medicine

## 2011-02-06 ENCOUNTER — Encounter (HOSPITAL_COMMUNITY): Payer: Self-pay | Admitting: *Deleted

## 2011-02-06 ENCOUNTER — Emergency Department (HOSPITAL_COMMUNITY): Payer: Medicare Other

## 2011-02-06 ENCOUNTER — Telehealth: Payer: Self-pay | Admitting: Internal Medicine

## 2011-02-06 DIAGNOSIS — K644 Residual hemorrhoidal skin tags: Secondary | ICD-10-CM | POA: Insufficient documentation

## 2011-02-06 DIAGNOSIS — G4730 Sleep apnea, unspecified: Secondary | ICD-10-CM | POA: Insufficient documentation

## 2011-02-06 DIAGNOSIS — Z860100 Personal history of colon polyps, unspecified: Secondary | ICD-10-CM | POA: Insufficient documentation

## 2011-02-06 DIAGNOSIS — E785 Hyperlipidemia, unspecified: Secondary | ICD-10-CM | POA: Insufficient documentation

## 2011-02-06 DIAGNOSIS — K5900 Constipation, unspecified: Secondary | ICD-10-CM | POA: Insufficient documentation

## 2011-02-06 DIAGNOSIS — R1084 Generalized abdominal pain: Secondary | ICD-10-CM | POA: Insufficient documentation

## 2011-02-06 DIAGNOSIS — I10 Essential (primary) hypertension: Secondary | ICD-10-CM | POA: Insufficient documentation

## 2011-02-06 DIAGNOSIS — K625 Hemorrhage of anus and rectum: Secondary | ICD-10-CM

## 2011-02-06 DIAGNOSIS — J45909 Unspecified asthma, uncomplicated: Secondary | ICD-10-CM | POA: Insufficient documentation

## 2011-02-06 DIAGNOSIS — Z79899 Other long term (current) drug therapy: Secondary | ICD-10-CM | POA: Insufficient documentation

## 2011-02-06 DIAGNOSIS — K219 Gastro-esophageal reflux disease without esophagitis: Secondary | ICD-10-CM | POA: Insufficient documentation

## 2011-02-06 DIAGNOSIS — K6289 Other specified diseases of anus and rectum: Secondary | ICD-10-CM

## 2011-02-06 LAB — BASIC METABOLIC PANEL WITH GFR
CO2: 27 meq/L (ref 19–32)
Calcium: 9.6 mg/dL (ref 8.4–10.5)
Chloride: 107 meq/L (ref 96–112)
Glucose, Bld: 88 mg/dL (ref 70–99)
Sodium: 141 meq/L (ref 135–145)

## 2011-02-06 LAB — CBC
HCT: 37.5 % (ref 36.0–46.0)
Hemoglobin: 12 g/dL (ref 12.0–15.0)
MCH: 27 pg (ref 26.0–34.0)
MCHC: 32 g/dL (ref 30.0–36.0)
MCV: 84.3 fL (ref 78.0–100.0)
Platelets: 168 10*3/uL (ref 150–400)
RBC: 4.45 MIL/uL (ref 3.87–5.11)
RDW: 14.4 % (ref 11.5–15.5)
WBC: 4.2 10*3/uL (ref 4.0–10.5)

## 2011-02-06 LAB — BASIC METABOLIC PANEL
BUN: 16 mg/dL (ref 6–23)
Creatinine, Ser: 0.68 mg/dL (ref 0.50–1.10)
GFR calc Af Amer: >90 mL/min (ref >90)
GFR calc non Af Amer: >90 mL/min (ref >90)
Potassium: 4 mEq/L (ref 3.5–5.1)

## 2011-02-06 MED ORDER — OXYCODONE-ACETAMINOPHEN 5-325 MG PO TABS: ORAL_TABLET | ORAL | Status: DC

## 2011-02-06 MED ORDER — MORPHINE SULFATE 2 MG/ML IJ SOLN
2.0000 mg | Freq: Once | INTRAMUSCULAR | Status: AC
  Administered 2011-02-06: 2 mg via INTRAVENOUS
  Filled 2011-02-06: qty 1

## 2011-02-06 MED ORDER — MORPHINE SULFATE 4 MG/ML IJ SOLN
4.0000 mg | Freq: Once | INTRAMUSCULAR | Status: AC
  Administered 2011-02-06: 4 mg via INTRAVENOUS
  Filled 2011-02-06: qty 1

## 2011-02-06 MED ORDER — SODIUM CHLORIDE 0.9 % IV SOLN
Freq: Once | INTRAVENOUS | Status: AC
  Administered 2011-02-06: 12:00:00 via INTRAVENOUS

## 2011-02-06 MED ORDER — HYDROCORTISONE 2.5 % RE CREA: TOPICAL_CREAM | RECTAL | Status: DC

## 2011-02-06 NOTE — Telephone Encounter (Signed)
Forward to Dr. Amador Cunas to inform

## 2011-02-06 NOTE — Telephone Encounter (Signed)
agree

## 2011-02-06 NOTE — ED Provider Notes (Cosign Needed)
History     CSN: [id number] Arrival date & time: 02/06/2011  9:46 AM   First MD Initiated Contact with Patient 02/06/11 1029      Chief Complaint  Patient presents with  . Rectal Bleeding    (Consider location/radiation/quality/duration/timing/severity/associated sxs/prior treatment) HPI Comments: Patient reports that she has a history of hemorrhoids which last bothered her several months the past. She reports about one to one and a half years ago she had a colonoscopy and found several polyps which were removed. She denies a history of cancer. She believes it was performed by Dr. Arlyce Dice. She reports over the past week she's had intermittent constipation with progressively worsening pain in her rectal area that radiates to both buttocks. She reports pain with straining from having some bowel movements. She reports that stool is overall brown in color with occasional parts that are black with some streaking of marroonish colored blood. She reports toilet water is not red with blood. She denies any abdominal pain, fevers, chills. She has no nausea or vomiting. She called her primary care office today due to her symptoms and was told to come to the emergency department. She reports that she has been taking medicated pads, preparation H. and also taking stool softeners at home. Patient used to be on oxycodone for pain. She implies that in the past she was given oxycodone as well for similar symptoms. I informed her that heavy narcotic use likely exacerbates constipation which would lead to a hemorrhoid problems. She denies any back pain, numbness or weakness  The history is provided by the patient.    Past Medical History  Diagnosis Date  . GERD (gastroesophageal reflux disease)   . Hyperlipidemia   . Hypertension   . Sleep apnea   . Low back pain   . Asthma   . Colon polyps     Past Surgical History  Procedure Date  . Cardiac catheterization   . Colonoscopy     remote  . Abdominal  hysterectomy     ectopic, fibroids  . Foot surgery     bilat    Family History  Problem Relation Age of Onset  . Cancer Mother     breast  . Heart attack Mother   . Cancer Other     breast, colon, lung  . Heart disease Other     History  Substance Use Topics  . Smoking status: Never Smoker   . Smokeless tobacco: Not on file  . Alcohol Use: No    OB History    Grav Para Term Preterm Abortions TAB SAB Ect Mult Living                  Review of Systems  Constitutional: Negative.   Gastrointestinal: Positive for constipation and rectal pain. Negative for nausea, vomiting, abdominal pain and diarrhea.  Musculoskeletal: Negative for back pain.  All other systems reviewed and are negative.    Allergies  Aspirin; Fish oil; Ibuprofen; and Penicillins  Home Medications   Current Outpatient Rx  Name Route Sig Dispense Refill  . ACETAMINOPHEN 500 MG PO TABS Oral Take 1,000 mg by mouth every 6 (six) hours as needed. For pain.     Marland Kitchen AMLODIPINE BESYLATE 5 MG PO TABS Oral Take 5 mg by mouth daily.      Marland Kitchen BENAZEPRIL-HYDROCHLOROTHIAZIDE 20-12.5 MG PO TABS Oral Take 1 tablet by mouth daily.     Gwyndolyn Kaufman LEAF POWD Oral Take 4 capsules by mouth daily.      Marland Kitchen  CYCLOBENZAPRINE HCL 10 MG PO TABS Oral Take 10 mg by mouth 3 (three) times daily as needed. For pain.    Marland Kitchen FLUTICASONE-SALMETEROL 250-50 MCG/DOSE IN AEPB Inhalation Inhale 1 puff into the lungs every 12 (twelve) hours.      . FUROSEMIDE 40 MG PO TABS Oral Take 40 mg by mouth daily.      Marland Kitchen HYDROCODONE-ACETAMINOPHEN 10-325 MG PO TABS Oral Take 1 tablet by mouth every 6 (six) hours as needed. For pain.    Marland Kitchen LORAZEPAM 0.5 MG PO TABS Oral Take 0.5 mg by mouth 2 (two) times daily as needed. For anxiety.    Marland Kitchen LOVASTATIN 40 MG PO TABS Oral Take 40 mg by mouth at bedtime.      Marland Kitchen METOPROLOL TARTRATE 50 MG PO TABS Oral Take 50 mg by mouth 2 (two) times daily.      Marland Kitchen OMEPRAZOLE 20 MG PO CPDR Oral Take 2 capsules (40 mg total) by mouth daily.  30 capsule 6  . OXYCODONE HCL 30 MG PO TABS Oral Take 30 mg by mouth every 6 (six) hours as needed. For pain.      BP 169/101  Pulse 75  Temp(Src) 98.7 F (37.1 C) (Oral)  Resp 18  Ht 5\' 5"  (1.651 m)  Wt 221 lb (100.245 kg)  BMI 36.78 kg/m2  SpO2 100%  Physical Exam  Nursing note and vitals reviewed. Constitutional: She appears well-developed and well-nourished. No distress.  Eyes: Pupils are equal, round, and reactive to light. No scleral icterus.  Abdominal: Soft. Bowel sounds are normal. There is no tenderness. There is no rebound and no guarding.  Genitourinary:       Significant anal and rectal tenderness. Was not able to perform a significant rectal examination. I did not notice any gross bleeding. Hemoccult test was positive however. She does have evidence of several external hemorrhoids which do not appear to be incarcerated. They're not bluish in color nor tenderness nor fluctuant. I do not appreciate any areas of fluctuance around her anus or her rectum. There is no evidence of erythema or cellulitis.    ED Course  Procedures (including critical care time)   Labs Reviewed  POCT OCCULT BLOOD STOOL, DEVICE   No results found.   No diagnosis found.    MDM   In review of her prior records, she has visits to Dr. Arlyce Dice dating to March of 2012. There are no written notes in Epic that I can read.  PCP is Dr. Lesia Hausen.    My plan is to obtain routine blood counts primarily looking at her hemoglobin. Her vital signs are normal. The exception is her blood pressure is high likely due to discomfort and pain. The fact that the patient takes chronic narcotics likely contributes to constipation leading to her rectal pain and I think hemorrhoids. Patient is already on topical medication, over-the-counter. My plan is to obtain blood tests and contact Dr. Arlyce Dice for further outpatient evaluation.   2:46 PM CT of abd/pelvis is pending as pt endorses some intermittent abd pain as  well as rectal pain along with bleeding.  If no sig findings, will speak to Dr. Marzetta Board group to arrange follow up.  WBC and Hgb are WNL.    3:31 PM I spoke to Dr. Loreta Ave who will let Dr. Juanda Chance know to contact pt for follow up visit.    4:13 PM Pt does feel improved after IV morphine  Gavin Pound. Oletta Lamas, MD 02/06/11 [phone]

## 2011-02-06 NOTE — Discharge Instructions (Signed)
 Narcotic and benzodiazepine use may cause drowsiness, slowed breathing or dependence.  Please use with caution and do not drive, operate machinery or watch young children alone while taking them.  Taking combinations of these medications or drinking alcohol will potentiate these effects.    Percocet can also cause worsening constipation so use very gently and no more than is needed.  Be sure to follow up with Dr. Debrah next week.  Continue to drink plenty of water and continue to use your stool softeners as you are already using.  Use the Anusol  cream to rectum as well as ice packs to try to decrease swelling and pain.

## 2011-02-06 NOTE — ED Notes (Signed)
Patient transported to CT 

## 2011-02-06 NOTE — ED Notes (Signed)
Pt stated that she had rectal bleeding yesterday with blood noted in the toilet and on the tissue.  Hx hemorrhoids.

## 2011-02-06 NOTE — ED Notes (Signed)
Pt states she had noticed black tarry stool. Pt states painful to sit down. Pt  States she thinks she is constipated

## 2011-02-06 NOTE — Telephone Encounter (Signed)
Pt called and said she has rectal bleeding and stools are dark in color and bloody. Triage nurse recommended that pt go to ER.

## 2011-02-09 LAB — OCCULT BLOOD, POC DEVICE: Fecal Occult Bld: POSITIVE

## 2011-02-17 ENCOUNTER — Ambulatory Visit (INDEPENDENT_AMBULATORY_CARE_PROVIDER_SITE_OTHER): Payer: Medicare Other | Admitting: Gastroenterology

## 2011-02-17 ENCOUNTER — Encounter: Payer: Self-pay | Admitting: Gastroenterology

## 2011-02-17 DIAGNOSIS — K602 Anal fissure, unspecified: Secondary | ICD-10-CM

## 2011-02-17 DIAGNOSIS — Z86010 Personal history of colonic polyps: Secondary | ICD-10-CM

## 2011-02-17 NOTE — Assessment & Plan Note (Signed)
Plan followup colonoscopy 2015 

## 2011-02-17 NOTE — Assessment & Plan Note (Signed)
Plan diltiazem ointment twice a day, warm soaks and sigmoidoscopy with Botox injection

## 2011-02-17 NOTE — Progress Notes (Signed)
History of Present Illness: Miranda [name] is a 65-year-old Afro-American female referred at the request of Dr. Kwaitkowski for evaluation of rectal pain and bleeding. She has a history of colon polyps and was last examined by colonoscopy in February, 2012 where multiple polyps were removed.  For the past 2 weeks she's been complaining of severe rectal pain with or without defication. She  has seen small amounts of blood as well. A CT scan, which I reviewed, demonstrated some wall thickening of the rectum and at the junction of the rectosigmoid. She was evaluated in the ER and  discharged on pain medicines and suppositories. Symptoms have continued. She has severe pain that may radiate into her buttocks and down her legs.  Bowel movements are painful. She's had a minimal amount of bleeding.    Past Medical History  Diagnosis Date  . GERD (gastroesophageal reflux disease)   . Hyperlipidemia   . Hypertension   . Sleep apnea   . Low back pain   . Asthma   . Colon polyps    Past Surgical History  Procedure Date  . Cardiac catheterization   . Colonoscopy     remote  . Abdominal hysterectomy     ectopic, fibroids  . Foot surgery     bilat   family history includes Cancer in her mother and other; Heart attack in her mother; and Heart disease in her other. Current Outpatient Prescriptions  Medication Sig Dispense Refill  . acetaminophen (TYLENOL) 500 MG tablet Take 1,000 mg by mouth every 6 (six) hours as needed. For pain.       . amLODipine (NORVASC) 5 MG tablet Take 5 mg by mouth daily.        . benazepril-hydrochlorthiazide (LOTENSIN HCT) 20-12.5 MG per tablet Take 1 tablet by mouth daily.       . [employer] (SENNA LEAF) POWD Take 4 capsules by mouth daily.        . cyclobenzaprine (FLEXERIL) 10 MG tablet Take 10 mg by mouth 3 (three) times daily as needed. For pain.      . Fluticasone-Salmeterol (ADVAIR) 250-50 MCG/DOSE AEPB Inhale 1 puff into the lungs every 12 (twelve) hours.        .  furosemide (LASIX) 40 MG tablet Take 40 mg by mouth daily.        . HYDROcodone-acetaminophen (NORCO) 10-325 MG per tablet Take 1 tablet by mouth every 6 (six) hours as needed. For pain.      . hydrocortisone (ANUSOL-HC) 2.5 % rectal cream Apply rectally 2 times daily       . LORazepam (ATIVAN) 0.5 MG tablet Take 0.5 mg by mouth 2 (two) times daily as needed. For anxiety.      . lovastatin (MEVACOR) 40 MG tablet Take 40 mg by mouth at bedtime.        . metoprolol (LOPRESSOR) 50 MG tablet Take 50 mg by mouth 2 (two) times daily.        . omeprazole (PRILOSEC) 20 MG capsule Take 2 capsules (40 mg total) by mouth daily.  30 capsule  6  . oxycodone (ROXICODONE) 30 MG immediate release tablet Take 30 mg by mouth every 6 (six) hours as needed. For pain.      . oxyCODONE-acetaminophen (PERCOCET) 5-325 MG per tablet 1-2 tablets po q 6 hours prn pain  20 tablet  0   Allergies as of 02/17/2011 - Review Complete 02/17/2011  Allergen Reaction Noted  . Aspirin  04/29/2010  . Fish oil    04/30/2009  . Ibuprofen  04/29/2010  . Ivp dye (iodinated diagnostic agents)  02/06/2011  . Penicillins  04/29/2010    reports that she has never smoked. She has never used smokeless tobacco. She reports that she does not drink alcohol. Her drug history not on file.     Review of Systems: Pertinent positive and negative review of systems were noted in the above HPI section. All other review of systems were otherwise negative.  Vital signs were reviewed in today's medical record Physical Exam: General: Well developed , well nourished, no acute distress Head: Normocephalic and atraumatic Eyes:  sclerae anicteric, EOMI Ears: Normal auditory acuity Mouth: No deformity or lesions Neck: Supple, no masses or thyromegaly Lungs: Clear throughout to auscultation Heart: Regular rate and rhythm; no murmurs, rubs or bruits Abdomen: Soft, non tender and non distended. No masses, hepatosplenomegaly or hernias noted. Normal Bowel  sounds Rectal: External hemorrhoids are present. There is an anal fissure at approximately 11:00 Musculoskeletal: Symmetrical with no gross deformities  Skin: No lesions on visible extremities Pulses:  Normal pulses noted Extremities: No clubbing, cyanosis, edema or deformities noted Neurological: Alert oriented x 4, grossly nonfocal Cervical Nodes:  No significant cervical adenopathy Inguinal Nodes: No significant inguinal adenopathy Psychological:  Alert and cooperative. Normal mood and affect       

## 2011-02-17 NOTE — Patient Instructions (Signed)
Your Procedure is scheduled on 02/25/2011 at 11:45 Separate instructions given We will call in your prescription to [facility]

## 2011-02-23 ENCOUNTER — Encounter (HOSPITAL_COMMUNITY): Payer: Self-pay | Admitting: *Deleted

## 2011-02-25 ENCOUNTER — Encounter (HOSPITAL_COMMUNITY): Payer: Self-pay | Admitting: Gastroenterology

## 2011-02-25 ENCOUNTER — Encounter (HOSPITAL_COMMUNITY)
Admission: RE | Disposition: A | Discharge status: Home / Self Care | Payer: Self-pay | Source: Ambulatory Visit | Attending: Gastroenterology

## 2011-02-25 ENCOUNTER — Ambulatory Visit (HOSPITAL_COMMUNITY)
Admission: RE | Admit: 2011-02-25 | Discharge: 2011-02-25 | Disposition: A | Discharge status: Home / Self Care | Payer: Medicare Other | Source: Ambulatory Visit | Attending: Gastroenterology | Admitting: Gastroenterology

## 2011-02-25 DIAGNOSIS — Z860100 Personal history of colon polyps, unspecified: Secondary | ICD-10-CM | POA: Insufficient documentation

## 2011-02-25 DIAGNOSIS — I10 Essential (primary) hypertension: Secondary | ICD-10-CM | POA: Insufficient documentation

## 2011-02-25 DIAGNOSIS — E785 Hyperlipidemia, unspecified: Secondary | ICD-10-CM | POA: Insufficient documentation

## 2011-02-25 DIAGNOSIS — K602 Anal fissure, unspecified: Secondary | ICD-10-CM | POA: Insufficient documentation

## 2011-02-25 DIAGNOSIS — Z90710 Acquired absence of both cervix and uterus: Secondary | ICD-10-CM | POA: Insufficient documentation

## 2011-02-25 DIAGNOSIS — G4730 Sleep apnea, unspecified: Secondary | ICD-10-CM | POA: Insufficient documentation

## 2011-02-25 DIAGNOSIS — K219 Gastro-esophageal reflux disease without esophagitis: Secondary | ICD-10-CM | POA: Insufficient documentation

## 2011-02-25 DIAGNOSIS — J45909 Unspecified asthma, uncomplicated: Secondary | ICD-10-CM | POA: Insufficient documentation

## 2011-02-25 DIAGNOSIS — Z79899 Other long term (current) drug therapy: Secondary | ICD-10-CM | POA: Insufficient documentation

## 2011-02-25 HISTORY — PX: FLEXIBLE SIGMOIDOSCOPY: SHX5431

## 2011-02-25 SURGERY — SIGMOIDOSCOPY, FLEXIBLE
Anesthesia: Moderate Sedation

## 2011-02-25 MED ORDER — FENTANYL CITRATE 0.05 MG/ML IJ SOLN
INTRAMUSCULAR | Status: AC
  Filled 2011-02-25: qty 2

## 2011-02-25 MED ORDER — ONABOTULINUMTOXINA 100 UNITS IJ SOLR
100.0000 [IU] | Freq: Once | INTRAMUSCULAR | Status: AC
  Administered 2011-02-25: 20 [IU] via INTRAMUSCULAR
  Filled 2011-02-25: qty 100

## 2011-02-25 MED ORDER — SODIUM CHLORIDE 0.9 % IV SOLN
INTRAVENOUS | Status: DC
  Administered 2011-02-25: 500 mL via INTRAVENOUS

## 2011-02-25 MED ORDER — ONABOTULINUMTOXINA 100 UNITS IJ SOLR: 100.0000 [IU] | INTRAMUSCULAR | Status: DC

## 2011-02-25 MED ORDER — DIPHENHYDRAMINE HCL 50 MG/ML IJ SOLN
INTRAMUSCULAR | Status: AC
  Filled 2011-02-25: qty 1

## 2011-02-25 MED ORDER — MIDAZOLAM HCL 10 MG/2ML IJ SOLN
INTRAMUSCULAR | Status: DC | PRN
  Administered 2011-02-25: 1 mg via INTRAVENOUS
  Administered 2011-02-25 (×2): 2 mg via INTRAVENOUS

## 2011-02-25 MED ORDER — HYDROCODONE-ACETAMINOPHEN 5-500 MG PO TABS: 2.0000 | ORAL_TABLET | Freq: Four times a day (QID) | ORAL | Status: DC | PRN

## 2011-02-25 MED ORDER — FENTANYL CITRATE 0.05 MG/ML IJ SOLN
INTRAMUSCULAR | Status: DC | PRN
  Administered 2011-02-25 (×4): 25 ug via INTRAVENOUS

## 2011-02-25 MED ORDER — DIPHENHYDRAMINE HCL 50 MG/ML IJ SOLN
INTRAMUSCULAR | Status: DC | PRN
  Administered 2011-02-25: 25 mg via INTRAVENOUS

## 2011-02-25 MED ORDER — MIDAZOLAM HCL 10 MG/2ML IJ SOLN
INTRAMUSCULAR | Status: AC
  Filled 2011-02-25: qty 2

## 2011-02-25 NOTE — Op Note (Signed)
[facility] [address] [place], [place]  [address]  FLEXIBLE SIGMOIDOSCOPY PROCEDURE REPORT  PATIENT:  Miranda, [name]  MR#:  [id number] BIRTHDATE:  Jul 23, 1945, 65 yrs. old  GENDER:  female  ENDOSCOPIST:  Barbette Hair. Arlyce Dice, MD Referred by:  Eleonore Chiquito, M.D.  PROCEDURE DATE:  02/25/2011 PROCEDURE:  Flexible Sigmoidoscopy with Submucosal Injection ASA CLASS:  Class II INDICATIONS:  other Botox injection of an anal fissure  MEDICATIONS:   These medications were titrated to patient response per physician's verbal order, Fentanyl 100 mcg IV, Versed 6 mg IV, Benadryl 25 mg IV  DESCRIPTION OF PROCEDURE:   After the risks benefits and alternatives of the procedure were thoroughly explained, informed consent was obtained.  Digital rectal exam was performed and revealed anal fissure.  Previously described anal fissure. the fissure was injected at the proximal and distal and with 20 units (0.2cc) botox. The pentax [id number] endoscope was introduced through the anus and advanced to the , without limitations.  The quality of the prep was .  The instrument was then slowly withdrawn as the mucosa was fully examined. <<PROCEDUREIMAGES>>  The rectum and sigmoid appeared normal (see image1, image2, and image3).   Retroflexed views in the rectum revealed no abnormalities.    The scope was then withdrawn from the patient and the procedure terminated.  COMPLICATIONS:  None  ENDOSCOPIC IMPRESSION: 1) anal fissure-status post Botox injection RECOMMENDATIONS: 1) continue current meds  REPEAT EXAM:  No  ______________________________ Barbette Hair. Arlyce Dice, MD  CC:  n. eSIGNED:   Barbette Hair. Maxi Carreras at 02/25/2011 12:24 PM  Jerene Bears, [id number]

## 2011-02-25 NOTE — H&P (View-Only) (Signed)
History of Present Illness: Miranda [name] is a 65 year old Afro-American female referred at the request of Dr. Frederica Kuster for evaluation of rectal pain and bleeding. She has a history of colon polyps and was last examined by colonoscopy in February, 2012 where multiple polyps were removed.  For the past 2 weeks she's been complaining of severe rectal pain with or without defication. She  has seen small amounts of blood as well. A CT scan, which I reviewed, demonstrated some wall thickening of the rectum and at the junction of the rectosigmoid. She was evaluated in the ER and  discharged on pain medicines and suppositories. Symptoms have continued. She has severe pain that may radiate into her buttocks and down her legs.  Bowel movements are painful. She's had a minimal amount of bleeding.    Past Medical History  Diagnosis Date  . GERD (gastroesophageal reflux disease)   . Hyperlipidemia   . Hypertension   . Sleep apnea   . Low back pain   . Asthma   . Colon polyps    Past Surgical History  Procedure Date  . Cardiac catheterization   . Colonoscopy     remote  . Abdominal hysterectomy     ectopic, fibroids  . Foot surgery     bilat   family history includes Cancer in her mother and other; Heart attack in her mother; and Heart disease in her other. Current Outpatient Prescriptions  Medication Sig Dispense Refill  . acetaminophen (TYLENOL) 500 MG tablet Take 1,000 mg by mouth every 6 (six) hours as needed. For pain.       Marland Kitchen amLODipine (NORVASC) 5 MG tablet Take 5 mg by mouth daily.        . benazepril-hydrochlorthiazide (LOTENSIN HCT) 20-12.5 MG per tablet Take 1 tablet by mouth daily.       [employer] (SENNA LEAF) POWD Take 4 capsules by mouth daily.        . cyclobenzaprine (FLEXERIL) 10 MG tablet Take 10 mg by mouth 3 (three) times daily as needed. For pain.      Marland Kitchen Fluticasone-Salmeterol (ADVAIR) 250-50 MCG/DOSE AEPB Inhale 1 puff into the lungs every 12 (twelve) hours.        .  furosemide (LASIX) 40 MG tablet Take 40 mg by mouth daily.        Marland Kitchen HYDROcodone-acetaminophen (NORCO) 10-325 MG per tablet Take 1 tablet by mouth every 6 (six) hours as needed. For pain.      . hydrocortisone (ANUSOL-HC) 2.5 % rectal cream Apply rectally 2 times daily       . LORazepam (ATIVAN) 0.5 MG tablet Take 0.5 mg by mouth 2 (two) times daily as needed. For anxiety.      . lovastatin (MEVACOR) 40 MG tablet Take 40 mg by mouth at bedtime.        . metoprolol (LOPRESSOR) 50 MG tablet Take 50 mg by mouth 2 (two) times daily.        Marland Kitchen omeprazole (PRILOSEC) 20 MG capsule Take 2 capsules (40 mg total) by mouth daily.  30 capsule  6  . oxycodone (ROXICODONE) 30 MG immediate release tablet Take 30 mg by mouth every 6 (six) hours as needed. For pain.      Marland Kitchen oxyCODONE-acetaminophen (PERCOCET) 5-325 MG per tablet 1-2 tablets po q 6 hours prn pain  20 tablet  0   Allergies as of 02/17/2011 - Review Complete 02/17/2011  Allergen Reaction Noted  . Aspirin  04/29/2010  . Fish oil  04/30/2009  . Ibuprofen  04/29/2010  . Ivp dye (iodinated diagnostic agents)  02/06/2011  . Penicillins  04/29/2010    reports that she has never smoked. She has never used smokeless tobacco. She reports that she does not drink alcohol. Her drug history not on file.     Review of Systems: Pertinent positive and negative review of systems were noted in the above HPI section. All other review of systems were otherwise negative.  Vital signs were reviewed in today's medical record Physical Exam: General: Well developed , well nourished, no acute distress Head: Normocephalic and atraumatic Eyes:  sclerae anicteric, EOMI Ears: Normal auditory acuity Mouth: No deformity or lesions Neck: Supple, no masses or thyromegaly Lungs: Clear throughout to auscultation Heart: Regular rate and rhythm; no murmurs, rubs or bruits Abdomen: Soft, non tender and non distended. No masses, hepatosplenomegaly or hernias noted. Normal Bowel  sounds Rectal: External hemorrhoids are present. There is an anal fissure at approximately 11:00 Musculoskeletal: Symmetrical with no gross deformities  Skin: No lesions on visible extremities Pulses:  Normal pulses noted Extremities: No clubbing, cyanosis, edema or deformities noted Neurological: Alert oriented x 4, grossly nonfocal Cervical Nodes:  No significant cervical adenopathy Inguinal Nodes: No significant inguinal adenopathy Psychological:  Alert and cooperative. Normal mood and affect

## 2011-02-25 NOTE — Interval H&P Note (Signed)
History and Physical Interval Note:  02/25/2011 11:38 AM  Miranda [name]  has presented today for surgery, with the diagnosis of anal fissure  The various methods of treatment have been discussed with the patient and family. After consideration of risks, benefits and other options for treatment, the patient has consented to  Procedure(s): FLEXIBLE SIGMOIDOSCOPY BOTOX INJECTION as a surgical intervention .  The patients' history has been reviewed, patient examined, no change in status, stable for surgery.  I have reviewed the patients' chart and labs.  Questions were answered to the patient's satisfaction.     [name]

## 2011-02-25 NOTE — Progress Notes (Signed)
Prescription for vicodin given to the patient

## 2011-03-11 ENCOUNTER — Encounter (HOSPITAL_COMMUNITY): Payer: Self-pay | Admitting: Gastroenterology

## 2011-03-30 ENCOUNTER — Other Ambulatory Visit (INDEPENDENT_AMBULATORY_CARE_PROVIDER_SITE_OTHER): Payer: Medicare Other

## 2011-03-30 DIAGNOSIS — K219 Gastro-esophageal reflux disease without esophagitis: Secondary | ICD-10-CM

## 2011-03-30 DIAGNOSIS — E785 Hyperlipidemia, unspecified: Secondary | ICD-10-CM

## 2011-03-30 DIAGNOSIS — Z0000 Encounter for general adult medical examination without abnormal findings: Secondary | ICD-10-CM

## 2011-03-30 DIAGNOSIS — I10 Essential (primary) hypertension: Secondary | ICD-10-CM

## 2011-03-30 LAB — TSH: TSH: 0.91 u[IU]/mL (ref 0.35–5.50)

## 2011-03-30 LAB — CBC WITH DIFFERENTIAL/PLATELET
Basophils Absolute: 0 10*3/uL (ref 0.0–0.1)
Eosinophils Relative: 3.2 % (ref 0.0–5.0)
Lymphocytes Relative: 43.3 % (ref 12.0–46.0)
Monocytes Relative: 9.8 % (ref 3.0–12.0)
Neutrophils Relative %: 43.4 % (ref 43.0–77.0)
Platelets: 183 10*3/uL (ref 150.0–400.0)
RDW: 14.6 % (ref 11.5–14.6)
WBC: 5 10*3/uL (ref 4.5–10.5)

## 2011-03-30 LAB — HEPATIC FUNCTION PANEL
AST: 23 U/L (ref 0–37)
Alkaline Phosphatase: 52 U/L (ref 39–117)
Bilirubin, Direct: 0 mg/dL (ref 0.0–0.3)
Total Bilirubin: 0.5 mg/dL (ref 0.3–1.2)

## 2011-03-30 LAB — POCT URINALYSIS DIPSTICK
Nitrite, UA: NEGATIVE
Spec Grav, UA: 1.02
Urobilinogen, UA: 1

## 2011-03-30 LAB — LIPID PANEL
HDL: 47 mg/dL (ref >39.00)
Total CHOL/HDL Ratio: 5
VLDL: 22.6 mg/dL (ref 0.0–40.0)

## 2011-03-30 LAB — BASIC METABOLIC PANEL
BUN: 14 mg/dL (ref 6–23)
Calcium: 9.2 mg/dL (ref 8.4–10.5)
GFR: 101.45 mL/min (ref >60.00)
Glucose, Bld: 96 mg/dL (ref 70–99)

## 2011-03-30 LAB — LDL CHOLESTEROL, DIRECT: Direct LDL: 152.8 mg/dL

## 2011-04-06 ENCOUNTER — Ambulatory Visit (INDEPENDENT_AMBULATORY_CARE_PROVIDER_SITE_OTHER): Payer: Medicare Other | Admitting: Internal Medicine

## 2011-04-06 ENCOUNTER — Encounter: Payer: Self-pay | Admitting: Internal Medicine

## 2011-04-06 ENCOUNTER — Other Ambulatory Visit: Payer: Self-pay | Admitting: Internal Medicine

## 2011-04-06 DIAGNOSIS — Z86010 Personal history of colonic polyps: Secondary | ICD-10-CM

## 2011-04-06 DIAGNOSIS — K219 Gastro-esophageal reflux disease without esophagitis: Secondary | ICD-10-CM

## 2011-04-06 DIAGNOSIS — M545 Low back pain, unspecified: Secondary | ICD-10-CM

## 2011-04-06 DIAGNOSIS — Z0000 Encounter for general adult medical examination without abnormal findings: Secondary | ICD-10-CM

## 2011-04-06 DIAGNOSIS — Z1231 Encounter for screening mammogram for malignant neoplasm of breast: Secondary | ICD-10-CM

## 2011-04-06 DIAGNOSIS — I10 Essential (primary) hypertension: Secondary | ICD-10-CM

## 2011-04-06 DIAGNOSIS — E785 Hyperlipidemia, unspecified: Secondary | ICD-10-CM

## 2011-04-06 MED ORDER — HYDROCODONE-ACETAMINOPHEN 7.5-500 MG PO TABS: 1.0000 | ORAL_TABLET | Freq: Four times a day (QID) | ORAL | Status: AC | PRN

## 2011-04-06 MED ORDER — OMEPRAZOLE 20 MG PO CPDR: 40.0000 mg | DELAYED_RELEASE_CAPSULE | Freq: Every day | ORAL | Status: DC

## 2011-04-06 MED ORDER — AMLODIPINE BESYLATE 5 MG PO TABS: 5.0000 mg | ORAL_TABLET | Freq: Every day | ORAL | Status: DC

## 2011-04-06 MED ORDER — FUROSEMIDE 40 MG PO TABS: 40.0000 mg | ORAL_TABLET | Freq: Every day | ORAL | Status: DC

## 2011-04-06 MED ORDER — BENAZEPRIL-HYDROCHLOROTHIAZIDE 20-12.5 MG PO TABS: 1.0000 | ORAL_TABLET | Freq: Every day | ORAL | Status: DC

## 2011-04-06 MED ORDER — FLUTICASONE-SALMETEROL 250-50 MCG/DOSE IN AEPB: 1.0000 | INHALATION_SPRAY | Freq: Two times a day (BID) | RESPIRATORY_TRACT | Status: DC

## 2011-04-06 MED ORDER — LORAZEPAM 0.5 MG PO TABS: 0.5000 mg | ORAL_TABLET | Freq: Two times a day (BID) | ORAL | Status: DC | PRN

## 2011-04-06 MED ORDER — LOVASTATIN 40 MG PO TABS: 40.0000 mg | ORAL_TABLET | Freq: Every day | ORAL | Status: DC

## 2011-04-06 MED ORDER — METOPROLOL TARTRATE 50 MG PO TABS: 50.0000 mg | ORAL_TABLET | Freq: Two times a day (BID) | ORAL | Status: DC

## 2011-04-06 NOTE — Progress Notes (Signed)
Subjective:    Patient ID: Miranda [name], female    DOB: Aug 16, 1945, 66 y.o.   MRN: [id number]  HPI  66 year old patient who is seen today for an annual exam. Medical problems include dyslipidemia and hypertension. She states that she takes her lovastatin sporadically. She has chronic low back pain and remote history of asthma. Her chief complaint today is constipation this issue was discussed at length. She does have a history of colonic polyps.  1. Risk factors, based on past  M,S,F history-  cardiovascular risk factors include hypertension and dyslipidemia. She has a very strong history of cancer including lung breast and colon  2.  Physical activities: Limited somewhat due to chronic low back pain  3.  Depression/mood: No history of depression or mood disorder  4.  Hearing: No deficits  5.  ADL's: Independent in aspects of daily living  6.  Fall risk: Low  7.  Home safety: No problems identified  8.  Height weight, and visual acuity; height and weight stable no change in visual acuity  9.  Counseling: Heart healthy diet more regular exercise moderate weight loss all encouraged  10. Lab orders based on risk factors: Laboratory profile will be reviewed  11. Referral : Not appropriate at this time;  will need mammogram- she was encouraged to have this week or next  12. Care plan: Mammogram encouraged  13. Cognitive assessment: Alert and oriented with normal affect no cognitive dysfunction   Past Medical History  Diagnosis Date  . GERD (gastroesophageal reflux disease)   . Hyperlipidemia   . Hypertension   . Low back pain   . Asthma   . Colon polyps   . Sleep apnea     do not use c-pap    History   Social History  . Marital Status: Married    Spouse Name: N/A    Number of Children: N/A  . Years of Education: N/A   Occupational History  . Not on file.   Social History Main Topics  . Smoking status: Never Smoker   . Smokeless tobacco: Never Used  . Alcohol  Use: No  . Drug Use: No  . Sexually Active: Not on file   Other Topics Concern  . Not on file   Social History Narrative  . No narrative on file    Past Surgical History  Procedure Date  . Cardiac catheterization   . Colonoscopy     remote  . Abdominal hysterectomy     ectopic, fibroids  . Foot surgery     bilat  . Flexible sigmoidoscopy 02/25/2011    Procedure: FLEXIBLE SIGMOIDOSCOPY;  Surgeon: Louis Meckel, MD;  Location: [facility];  Service: Endoscopy;  Laterality: N/A;    Family History  Problem Relation Age of Onset  . Cancer Mother     breast  . Heart attack Mother   . Cancer Other     breast, colon, lung  . Heart disease Other   . Anesthesia problems Neg Hx   . Malignant hyperthermia Neg Hx     Allergies  Allergen Reactions  . Aspirin     REACTION: rash  . Fish Oil     REACTION: throat swelling  . Ibuprofen     REACTION: rash  . Ivp Dye (Iodinated Diagnostic Agents)   . Penicillins     REACTION: swelling    Current Outpatient Prescriptions on File Prior to Visit  Medication Sig Dispense Refill  . acetaminophen (TYLENOL) 500 MG tablet  Take 1,000 mg by mouth every 6 (six) hours as needed. For pain.       Marland Kitchen amLODipine (NORVASC) 5 MG tablet Take 5 mg by mouth daily.        . benazepril-hydrochlorthiazide (LOTENSIN HCT) 20-12.5 MG per tablet Take 1 tablet by mouth daily.       . cyclobenzaprine (FLEXERIL) 10 MG tablet Take 10 mg by mouth 3 (three) times daily as needed. For pain.      Marland Kitchen Fluticasone-Salmeterol (ADVAIR) 250-50 MCG/DOSE AEPB Inhale 1 puff into the lungs every 12 (twelve) hours.        . furosemide (LASIX) 40 MG tablet Take 40 mg by mouth daily.        Marland Kitchen lovastatin (MEVACOR) 40 MG tablet Take 40 mg by mouth at bedtime.        . metoprolol (LOPRESSOR) 50 MG tablet Take 50 mg by mouth 2 (two) times daily.        Marland Kitchen omeprazole (PRILOSEC) 20 MG capsule Take 2 capsules (40 mg total) by mouth daily.  30 capsule  6    BP 110/70  Pulse 70   Temp(Src) 98.1 F (36.7 C) (Oral)  Resp 18  Ht 5' 4.5" (1.638 m)  Wt 218 lb (98.884 kg)  BMI 36.84 kg/m2  SpO2 98%       Review of Systems  Constitutional: Negative for fever, appetite change, fatigue and unexpected weight change.  HENT: Negative for hearing loss, ear pain, nosebleeds, congestion, sore throat, mouth sores, trouble swallowing, neck stiffness, dental problem, voice change, sinus pressure and tinnitus.   Eyes: Negative for photophobia, pain, redness and visual disturbance.  Respiratory: Negative for cough, chest tightness and shortness of breath.   Cardiovascular: Negative for chest pain, palpitations and leg swelling.  Gastrointestinal: Positive for constipation. Negative for nausea, vomiting, abdominal pain, diarrhea, blood in stool, abdominal distention and rectal pain.  Genitourinary: Negative for dysuria, urgency, frequency, hematuria, flank pain, vaginal bleeding, vaginal discharge, difficulty urinating, genital sores, vaginal pain, menstrual problem (remote hysterectomy due to fibroids) and pelvic pain.  Musculoskeletal: Positive for back pain. Negative for arthralgias.  Skin: Negative for rash.  Neurological: Negative for dizziness, syncope, speech difficulty, weakness, light-headedness, numbness and headaches.  Hematological: Negative for adenopathy. Does not bruise/bleed easily.  Psychiatric/Behavioral: Negative for suicidal ideas, behavioral problems, self-injury, dysphoric mood and agitation. The patient is not nervous/anxious.        Objective:   Physical Exam  Constitutional: She is oriented to person, place, and time. She appears well-developed and well-nourished.  HENT:  Head: Normocephalic and atraumatic.  Right Ear: External ear normal.  Left Ear: External ear normal.  Mouth/Throat: Oropharynx is clear and moist.  Eyes: Conjunctivae and EOM are normal.  Neck: Normal range of motion. Neck supple. No JVD present. No thyromegaly present.    Cardiovascular: Normal rate, regular rhythm, normal heart sounds and intact distal pulses.   No murmur heard. Pulmonary/Chest: Effort normal and breath sounds normal. She has no wheezes. She has no rales.       The left breast was normal The right breast had some more nodularity; Especially at the 9:00 position which was slightly tender to palpation  Axillae clear  Abdominal: Soft. Bowel sounds are normal. She exhibits no distension and no mass. There is no tenderness. There is no rebound and no guarding.  Musculoskeletal: Normal range of motion. She exhibits no edema and no tenderness.  Neurological: She is alert and oriented to person, place, and time. She  has normal reflexes. No cranial nerve deficit. She exhibits normal muscle tone. Coordination normal.  Skin: Skin is warm and dry. No rash noted.  Psychiatric: She has a normal mood and affect. Her behavior is normal.          Assessment & Plan:   Preventive health examination. Patient needs followup mammogram. Has had recent colonoscopy Chronic low back pain. Medications refilled Dyslipidemia compliant with Mevacor stressed Hypertension stable

## 2011-04-06 NOTE — Patient Instructions (Addendum)
Limit your sodium (Salt) intake    It is important that you exercise regularly, at least 20 minutes 3 to 4 times per week.  If you develop chest pain or shortness of breath seek  medical attention.  Please check your blood pressure on a regular basis.  If it is consistently greater than 150/90, please make an office appointment.  Schedule your mammogram.   Return in 6 months for follow-up  You need to lose weight.  Consider a lower calorie diet and regular exercise.Constipation in Adults Constipation is having fewer than 2 bowel movements per week. Usually, the stools are hard. As we grow older, constipation is more common. If you try to fix constipation with laxatives, the problem may get worse. This is because laxatives taken over a long period of time make the colon muscles weaker. A low-fiber diet, not taking in enough fluids, and taking some medicines may make these problems worse. MEDICATIONS THAT MAY CAUSE CONSTIPATION  Water pills (diuretics).     Calcium channel blockers (used to control blood pressure and for the heart).     Certain pain medicines (narcotics).     Anticholinergics.    Anti-inflammatory agents.     Antacids that contain aluminum.  DISEASES THAT CONTRIBUTE TO CONSTIPATION  Diabetes.     Parkinson's disease.     Dementia.    Stroke.    Depression.    Illnesses that cause problems with salt and water metabolism.  HOME CARE INSTRUCTIONS    Constipation is usually best cared for without medicines. Increasing dietary fiber and eating more fruits and vegetables is the best way to manage constipation.     Slowly increase fiber intake to 25 to 38 grams per day. Whole grains, fruits, vegetables, and legumes are good sources of fiber. A dietitian can further help you incorporate high-fiber foods into your diet.     Drink enough water and fluids to keep your urine clear or pale yellow.     A fiber supplement may be added to your diet if you cannot get  enough fiber from foods.     Increasing your activities also helps improve regularity.     Suppositories, as suggested by your caregiver, will also help. If you are using antacids, such as aluminum or calcium containing products, it will be helpful to switch to products containing magnesium if your caregiver says it is okay.     If you have been given a liquid injection (enema) today, this is only a temporary measure. It should not be relied on for treatment of longstanding (chronic) constipation.     Stronger measures, such as magnesium sulfate, should be avoided if possible. This may cause uncontrollable diarrhea. Using magnesium sulfate may not allow you time to make it to the bathroom.  SEEK IMMEDIATE MEDICAL CARE IF:    There is bright red blood in the stool.     The constipation stays for more than 4 days.     There is belly (abdominal) or rectal pain.     You do not seem to be getting better.     You have any questions or concerns.  MAKE SURE YOU:    Understand these instructions.     Will watch your condition.     Will get help right away if you are not doing well or get worse.  Document Released: 11/29/2003 Document Revised: 11/12/2010 Document Reviewed: 10/19/2007 [facility] Patient Information 2012 [place], [place].

## 2011-04-08 ENCOUNTER — Telehealth: Payer: Self-pay | Admitting: *Deleted

## 2011-04-08 MED ORDER — ALBUTEROL SULFATE HFA 108 (90 BASE) MCG/ACT IN AERS: 2.0000 | INHALATION_SPRAY | Freq: Four times a day (QID) | RESPIRATORY_TRACT | Status: DC | PRN

## 2011-04-08 NOTE — Telephone Encounter (Signed)
Debby a NP from [facility] is calling because she would like a rescue inhaler called in . Pro-air okay per Dr Kirtland Bouchard

## 2011-04-08 NOTE — Telephone Encounter (Signed)
rx sent and Left message on machine for patient   

## 2011-06-01 ENCOUNTER — Ambulatory Visit (HOSPITAL_COMMUNITY): Payer: Medicare Other

## 2011-06-08 ENCOUNTER — Other Ambulatory Visit: Payer: Self-pay | Admitting: Internal Medicine

## 2011-06-08 ENCOUNTER — Ambulatory Visit (HOSPITAL_COMMUNITY): Payer: Medicare Other

## 2011-06-08 DIAGNOSIS — N63 Unspecified lump in unspecified breast: Secondary | ICD-10-CM

## 2011-06-09 ENCOUNTER — Telehealth: Payer: Self-pay | Admitting: Internal Medicine

## 2011-06-09 NOTE — Telephone Encounter (Signed)
What are you asking me to do?

## 2011-06-09 NOTE — Telephone Encounter (Signed)
I see there is an order in emr for sono and mammo -

## 2011-06-09 NOTE — Telephone Encounter (Signed)
This was just to inform

## 2011-06-09 NOTE — Telephone Encounter (Signed)
[facility] told [facility] that they felt something unusual in pts breast and recommended pt go to the [facility] for further dx examination. Pt said that [facility] faxed info over to Dr Amador Cunas.

## 2011-06-10 ENCOUNTER — Ambulatory Visit (INDEPENDENT_AMBULATORY_CARE_PROVIDER_SITE_OTHER): Payer: Medicare Other | Admitting: Internal Medicine

## 2011-06-10 ENCOUNTER — Encounter: Payer: Self-pay | Admitting: Internal Medicine

## 2011-06-10 VITALS — BP 180/100 | Temp 98.3°F | Wt 220.0 lb

## 2011-06-10 DIAGNOSIS — G4733 Obstructive sleep apnea (adult) (pediatric): Secondary | ICD-10-CM

## 2011-06-10 DIAGNOSIS — N6489 Other specified disorders of breast: Secondary | ICD-10-CM

## 2011-06-10 DIAGNOSIS — I10 Essential (primary) hypertension: Secondary | ICD-10-CM

## 2011-06-10 DIAGNOSIS — Z0000 Encounter for general adult medical examination without abnormal findings: Secondary | ICD-10-CM

## 2011-06-10 MED ORDER — BENAZEPRIL-HYDROCHLOROTHIAZIDE 20-12.5 MG PO TABS: ORAL_TABLET | ORAL | Status: DC

## 2011-06-10 NOTE — Progress Notes (Signed)
  Subjective:    Patient ID: Miranda [name], female    DOB: 05-Oct-1945, 66 y.o.   MRN: [id number]  HPI 65 year old patient who has treated hypertension. She was seen for her annual exam 2 months ago and there is some thickening involving the lateral aspect of the right breast. She still has not obtained a diagnostic mammogram. Complains of some increasing anxiety and stress. Her blood pressure is elevated today. She now complains of some mild right breast discomfort  The patient also states that she has a history of obstructive sleep apnea and had been evaluated by pulmonary and 2 with CPAP in the past. She complains of sleeping quite poorly and wishes to resume CPAP to assist with sleep  Review of Systems  Psychiatric/Behavioral: The patient is nervous/anxious.        Objective:   Physical Exam  Constitutional: She is oriented to person, place, and time. She appears well-developed and well-nourished.       Blood pressure 180/100  HENT:  Head: Normocephalic.  Right Ear: External ear normal.  Left Ear: External ear normal.  Mouth/Throat: Oropharynx is clear and moist.  Eyes: Conjunctivae and EOM are normal. Pupils are equal, round, and reactive to light.  Neck: Normal range of motion. Neck supple. No thyromegaly present.  Cardiovascular: Normal rate, regular rhythm, normal heart sounds and intact distal pulses.   Pulmonary/Chest: Effort normal and breath sounds normal.       Right axilla clear There was some thickening involving the 10:00 position of the right breast without dominant mass  Abdominal: Soft. Bowel sounds are normal. She exhibits no mass. There is no tenderness.  Musculoskeletal: Normal range of motion.  Lymphadenopathy:    She has no cervical adenopathy.  Neurological: She is alert and oriented to person, place, and time.  Skin: Skin is warm and dry. No rash noted.  Psychiatric: She has a normal mood and affect. Her behavior is normal.          Assessment &  Plan:   Hypertension poor control. Will increase Lotensin to 2 tablets daily and recheck in 2 weeks Abnormal right breast examination. We'll set up for diagnostic mammogram Obstructive sleep apnea. Will obtain consultation with inspiratory therapy and set up for home CPAP

## 2011-06-10 NOTE — Patient Instructions (Signed)
Increase Lotensin- HCT to 2 tablets daily  Recheck in 2 weeks  Diagnostic mammogram

## 2011-06-11 ENCOUNTER — Other Ambulatory Visit: Payer: Self-pay | Admitting: Internal Medicine

## 2011-06-11 DIAGNOSIS — G4730 Sleep apnea, unspecified: Secondary | ICD-10-CM

## 2011-06-15 ENCOUNTER — Ambulatory Visit
Admission: RE | Admit: 2011-06-15 | Discharge: 2011-06-15 | Disposition: A | Discharge status: Home / Self Care | Payer: Medicare Other | Source: Ambulatory Visit | Attending: Internal Medicine | Admitting: Internal Medicine

## 2011-06-15 DIAGNOSIS — N6489 Other specified disorders of breast: Secondary | ICD-10-CM

## 2011-06-15 DIAGNOSIS — N63 Unspecified lump in unspecified breast: Secondary | ICD-10-CM

## 2011-06-24 ENCOUNTER — Ambulatory Visit (INDEPENDENT_AMBULATORY_CARE_PROVIDER_SITE_OTHER): Payer: Medicare Other | Admitting: Internal Medicine

## 2011-06-24 ENCOUNTER — Encounter: Payer: Self-pay | Admitting: Internal Medicine

## 2011-06-24 VITALS — BP 130/80 | Temp 98.4°F | Wt 220.0 lb

## 2011-06-24 DIAGNOSIS — J45909 Unspecified asthma, uncomplicated: Secondary | ICD-10-CM

## 2011-06-24 DIAGNOSIS — I10 Essential (primary) hypertension: Secondary | ICD-10-CM

## 2011-06-24 DIAGNOSIS — M545 Low back pain, unspecified: Secondary | ICD-10-CM

## 2011-06-24 DIAGNOSIS — R32 Unspecified urinary incontinence: Secondary | ICD-10-CM

## 2011-06-24 DIAGNOSIS — M549 Dorsalgia, unspecified: Secondary | ICD-10-CM

## 2011-06-24 DIAGNOSIS — G4733 Obstructive sleep apnea (adult) (pediatric): Secondary | ICD-10-CM

## 2011-06-24 LAB — POCT URINALYSIS DIPSTICK
Bilirubin, UA: NEGATIVE
Ketones, UA: NEGATIVE
Nitrite, UA: NEGATIVE
pH, UA: 7

## 2011-06-24 MED ORDER — CIPROFLOXACIN HCL 500 MG PO TABS: 500.0000 mg | ORAL_TABLET | Freq: Two times a day (BID) | ORAL | Status: AC

## 2011-06-24 NOTE — Progress Notes (Signed)
  Subjective:    Patient ID: Miranda [name], female    DOB: May 22, 1945, 66 y.o.   MRN: [id number]  HPI 66 year old patient who is seen today for followup of her hypertension.  She has done quite well on her present regimen and blood pressure much improved today Other complaints include urinary frequency and dysuria along with occasional incontinence. UA was reviewed and did reveal pyuria She has a history of OSA but has had a dysfunctional CPAP machine for one year she is scheduled for a pulmonary followup in a couple of weeks and will be resuming CPAP support. She complains of very poor sleep habits for the past year since off CPAP Other complaints include moderate low back pain that has been constant. In general doing fairly well. Did have the mammogram performed recently that revealed no evidence of pathology     Review of Systems  Genitourinary: Positive for dysuria, urgency and frequency.  Musculoskeletal: Positive for back pain.  Psychiatric/Behavioral: Positive for sleep disturbance.       Objective:   Physical Exam  Constitutional: She appears well-developed and well-nourished. No distress.       Blood pressure 130/84          Assessment & Plan:   Hypertension improved. We'll continue present regimen UTI. Culture with Cipro for 7 days OSA. Followup pulmonary  Recheck 6 months

## 2011-06-24 NOTE — Patient Instructions (Signed)
Pulmonary medicine followup  Limit your sodium (Salt) intake  You need to lose weight.  Consider a lower calorie diet and regular exercise.  Please check your blood pressure on a regular basis.  If it is consistently greater than 150/90, please make an office appointment.    It is important that you exercise regularly, at least 20 minutes 3 to 4 times per week.  If you develop chest pain or shortness of breath seek  medical attention.  You need to lose weight.  Consider a lower calorie diet and regular exercise.  Return in 6 months for follow-up

## 2011-07-06 ENCOUNTER — Ambulatory Visit (INDEPENDENT_AMBULATORY_CARE_PROVIDER_SITE_OTHER): Payer: Medicare Other | Admitting: Family

## 2011-07-06 ENCOUNTER — Encounter: Payer: Self-pay | Admitting: Family

## 2011-07-06 ENCOUNTER — Encounter: Payer: Self-pay | Admitting: Pulmonary Disease

## 2011-07-06 ENCOUNTER — Ambulatory Visit (INDEPENDENT_AMBULATORY_CARE_PROVIDER_SITE_OTHER): Payer: Medicare Other | Admitting: Pulmonary Disease

## 2011-07-06 VITALS — BP 152/90 | Temp 97.9°F | Wt 209.0 lb

## 2011-07-06 VITALS — BP 140/86 | HR 70 | Temp 97.8°F | Ht 65.5 in | Wt 209.2 lb

## 2011-07-06 DIAGNOSIS — N390 Urinary tract infection, site not specified: Secondary | ICD-10-CM

## 2011-07-06 DIAGNOSIS — T7840XA Allergy, unspecified, initial encounter: Secondary | ICD-10-CM

## 2011-07-06 DIAGNOSIS — G4733 Obstructive sleep apnea (adult) (pediatric): Secondary | ICD-10-CM

## 2011-07-06 DIAGNOSIS — Z888 Allergy status to other drugs, medicaments and biological substances status: Secondary | ICD-10-CM

## 2011-07-06 LAB — POCT URINALYSIS DIPSTICK
Bilirubin, UA: NEGATIVE
Glucose, UA: NEGATIVE
Spec Grav, UA: >=1.03

## 2011-07-06 MED ORDER — NITROFURANTOIN MONOHYD MACRO 100 MG PO CAPS: 100.0000 mg | ORAL_CAPSULE | Freq: Two times a day (BID) | ORAL | Status: AC

## 2011-07-06 MED ORDER — HYDROCORTISONE ACETATE 25 MG RE SUPP: 25.0000 mg | Freq: Two times a day (BID) | RECTAL | Status: AC

## 2011-07-06 MED ORDER — METHYLPREDNISOLONE ACETATE 80 MG/ML IJ SUSP
80.0000 mg | Freq: Once | INTRAMUSCULAR | Status: AC
  Administered 2011-07-06: 80 mg via INTRAMUSCULAR

## 2011-07-06 NOTE — Progress Notes (Signed)
Subjective:    Patient ID: Miranda [name], female    DOB: 1945-09-15, 66 y.o.   MRN: [id number]  HPI 66 year old [place] American female, patient of Dr. Kirtland Bouchard  is in today after having a reaction to Cipro. Patient reports a generalized body rash, nausea and diarrhea. She has not taken the medication at this point. Taken Benadryl with no relief. She was taken it for urinary tract infection. She continues to have urinary frequency and urgency. Patient also reports hemorrhoids as result of the diarrhea. She denies any lightheadedness, dizziness, chest pain, palpitations, shortness of breath or edema.   Review of Systems  Constitutional: Negative.   HENT: Negative.   Respiratory: Negative.   Cardiovascular: Negative.   Gastrointestinal: Positive for nausea and diarrhea. Negative for vomiting.       Hemorrhoids  Skin: Positive for rash.       Generalized body rash related to Cipro  Hematological: Negative.   Psychiatric/Behavioral: Negative.    Past Medical History  Diagnosis Date  . GERD (gastroesophageal reflux disease)   . Hyperlipidemia   . Hypertension   . Low back pain   . Asthma   . Colon polyps   . Sleep apnea     do not use c-pap    History   Social History  . Marital Status: Married    Spouse Name: N/A    Number of Children: N/A  . Years of Education: N/A   Occupational History  . retired Lawyer    Social History Main Topics  . Smoking status: Never Smoker   . Smokeless tobacco: Never Used  . Alcohol Use: No  . Drug Use: No  . Sexually Active: Not on file   Other Topics Concern  . Not on file   Social History Narrative  . No narrative on file    Past Surgical History  Procedure Date  . Cardiac catheterization   . Colonoscopy     remote  . Abdominal hysterectomy     ectopic, fibroids  . Foot surgery     bilat  . Flexible sigmoidoscopy 02/25/2011    Procedure: FLEXIBLE SIGMOIDOSCOPY;  Surgeon: Louis Meckel, MD;  Location: [facility];  Service:  Endoscopy;  Laterality: N/A;    Family History  Problem Relation Age of Onset  . Cancer Mother     breast  . Heart attack Mother   . Cancer Other     breast, colon, lung  . Heart disease Other   . Anesthesia problems Neg Hx   . Emphysema Sister   . Asthma Sister   . Heart disease Sister   . Rheum arthritis Sister     Allergies  Allergen Reactions  . Aspirin     REACTION: rash  . Ciprofloxacin     Rash/hives/itch  . Fish Oil     REACTION: throat swelling  . Ibuprofen     REACTION: rash  . Ivp Dye (Iodinated Diagnostic Agents)   . Penicillins     REACTION: swelling    Current Outpatient Prescriptions on File Prior to Visit  Medication Sig Dispense Refill  . acetaminophen (TYLENOL) 500 MG tablet Take 1,000 mg by mouth every 6 (six) hours as needed. For pain.       Marland Kitchen albuterol (PROVENTIL HFA;VENTOLIN HFA) 108 (90 BASE) MCG/ACT inhaler Inhale 2 puffs into the lungs every 6 (six) hours as needed for wheezing.  1 Inhaler  3  . amLODipine (NORVASC) 5 MG tablet Take 1 tablet (5 mg total) by  mouth daily.  90 tablet  6  . benazepril-hydrochlorthiazide (LOTENSIN HCT) 20-12.5 MG per tablet 2 tablets daily  90 tablet  6  . cyclobenzaprine (FLEXERIL) 10 MG tablet Take 10 mg by mouth 3 (three) times daily as needed. For pain.      Marland Kitchen Fluticasone-Salmeterol (ADVAIR) 250-50 MCG/DOSE AEPB Inhale 1 puff into the lungs every 12 (twelve) hours.  60 each  6  . furosemide (LASIX) 40 MG tablet Take 1 tablet (40 mg total) by mouth daily.  90 tablet  5  . LORazepam (ATIVAN) 0.5 MG tablet Take 1 tablet (0.5 mg total) by mouth 2 (two) times daily as needed. For anxiety.  60 tablet  2  . lovastatin (MEVACOR) 40 MG tablet Take 1 tablet (40 mg total) by mouth at bedtime.  90 tablet  6  . metoprolol (LOPRESSOR) 50 MG tablet Take 1 tablet (50 mg total) by mouth 2 (two) times daily.  90 tablet  6  . omeprazole (PRILOSEC) 20 MG capsule Take 2 capsules (40 mg total) by mouth daily.  30 capsule  6   No  current facility-administered medications on file prior to visit.    BP 152/90  Temp(Src) 97.9 F (36.6 C) (Oral)  Wt 209 lb (94.802 kg)chart    Objective:   Physical Exam  Constitutional: She is oriented to person, place, and time. She appears well-developed and well-nourished.  HENT:  Right Ear: External ear normal.  Left Ear: External ear normal.  Mouth/Throat: Oropharynx is clear and moist.  Neck: Normal range of motion. Neck supple.  Cardiovascular: Normal rate, regular rhythm and normal heart sounds.   Pulmonary/Chest: Effort normal and breath sounds normal.  Abdominal: Soft. Bowel sounds are normal.  Neurological: She is alert and oriented to person, place, and time.  Skin: Skin is warm and dry.       Generalized, pruritic, rash noted to the chest, back, neck, and scalp. Red.   Psychiatric: She has a normal mood and affect.          Assessment & Plan:  Assessment: Allergic reaction to medication, urinary tract infection, hemorrhoids-external  Plan: Depo-Medrol 80 mg IM x1 was given, Macrobid 100 mg twice a day x7 days, hydrocortisone suppositories as directed. Benadryl when necessary. Patient call the office if her symptoms worsen or persist, recheck as scheduled and when necessary.

## 2011-07-06 NOTE — Patient Instructions (Signed)
Hemorrhoids Hemorrhoids are enlarged (dilated) veins around the rectum. There are 2 types of hemorrhoids, and the type of hemorrhoid is determined by its location. Internal hemorrhoids occur in the veins just inside the rectum.They are usually not painful, but they may bleed.However, they may poke through to the outside and become irritated and painful. External hemorrhoids involve the veins outside the anus and can be felt as a painful swelling or hard lump near the anus.They are often itchy and may crack and bleed. Sometimes clots will form in the veins. This makes them swollen and painful. These are called thrombosed hemorrhoids. CAUSES Causes of hemorrhoids include:  Pregnancy. This increases the pressure in the hemorrhoidal veins.   Constipation.   Straining to have a bowel movement.   Obesity.   Heavy lifting or other activity that caused you to strain.  TREATMENT Most of the time hemorrhoids improve in 1 to 2 weeks. However, if symptoms do not seem to be getting better or if you have a lot of rectal bleeding, your caregiver may perform a procedure to help make the hemorrhoids get smaller or remove them completely.Possible treatments include:  Rubber band ligation. A rubber band is placed at the base of the hemorrhoid to cut off the circulation.   Sclerotherapy. A chemical is injected to shrink the hemorrhoid.   Infrared light therapy. Tools are used to burn the hemorrhoid.   Hemorrhoidectomy. This is surgical removal of the hemorrhoid.  HOME CARE INSTRUCTIONS   Increase fiber in your diet. Ask your caregiver about using fiber supplements.   Drink enough water and fluids to keep your urine clear or pale yellow.   Exercise regularly.   Go to the bathroom when you have the urge to have a bowel movement. Do not wait.   Avoid straining to have bowel movements.   Keep the anal area dry and clean.   Only take over-the-counter or prescription medicines for pain, discomfort,  or fever as directed by your caregiver.  If your hemorrhoids are thrombosed:  Take warm sitz baths for 20 to 30 minutes, 3 to 4 times per day.   If the hemorrhoids are very tender and swollen, place ice packs on the area as tolerated. Using ice packs between sitz baths may be helpful. Fill a plastic bag with ice. Place a towel between the bag of ice and your skin.   Medicated creams and suppositories may be used or applied as directed.   Do not use a donut-shaped pillow or sit on the toilet for long periods. This increases blood pooling and pain.  SEEK MEDICAL CARE IF:   You have increasing pain and swelling that is not controlled with your medicine.   You have uncontrolled bleeding.   You have difficulty or you are unable to have a bowel movement.   You have pain or inflammation outside the area of the hemorrhoids.   You have chills or an oral temperature above 102 F (38.9 C).  MAKE SURE YOU:   Understand these instructions.   Will watch your condition.   Will get help right away if you are not doing well or get worse.  Document Released: 02/28/2000 Document Revised: 02/19/2011 Document Reviewed: 07/05/2007 [employer] Patient Information 2012 [employer]. 

## 2011-07-06 NOTE — Progress Notes (Signed)
  Subjective:    Patient ID: Miranda [name], female    DOB: 09-18-1945, 66 y.o.   MRN: [id number]  HPI The patient is a 66 year old female who I have been asked to see for management of obstructive sleep apnea.  She was diagnosed approximately 2003, and started on CPAP with improvement in her symptoms.  She wore the device for a few years, and then stopped using because the mask began to crack and she could not get a replacement.  She now has issues with loud snoring, as well as witnessed apneas by family members.  She has frequent awakenings at night, and nonrestorative sleep.  She has significant sleepiness during the day with any period of inactivity, and often will take naps during the day.  She will also fall asleep easily in the evening watching television.  The patient states that her weight is up significantly from her initial diagnosis, and her Epworth sleepiness score today is 14.  Sleep Questionnaire: What time do you typically go to bed?( Between what hours) 8:30 to 9:00 pm How long does it take you to fall asleep? 40 mins to 1 hour How many times during the night do you wake up? What time do you get out of bed to start your day? 0600 Do you drive or operate heavy machinery in your occupation? No How much has your weight changed (up or down) over the past two years? (In pounds) Have you ever had a sleep study before? Yes If yes, location of study? [facility] If yes, date of study? approx 8 years ago Do you currently use CPAP? No Do you wear oxygen at any time? No    Review of Systems  Constitutional: Positive for appetite change and unexpected weight change. Negative for fever.  HENT: Positive for congestion, sore throat, sneezing and trouble swallowing. Negative for ear pain, nosebleeds, rhinorrhea, dental problem, postnasal drip and sinus pressure.   Eyes: Negative for redness and itching.  Respiratory: Positive for cough and shortness of breath. Negative for chest tightness and  wheezing.   Cardiovascular: Positive for chest pain, palpitations and leg swelling.  Gastrointestinal: Negative for nausea and vomiting.  Genitourinary: Negative for dysuria.  Musculoskeletal: Positive for joint swelling.  Skin: Positive for rash.  Neurological: Positive for headaches.  Hematological: Does not bruise/bleed easily.  Psychiatric/Behavioral: Positive for dysphoric mood. The patient is nervous/anxious.        Objective:   Physical Exam Constitutional:  Obese female, no acute distress  HENT:  Nares patent without discharge, but large turbinates.  Oropharynx without exudate, uvula normal, palate elongated.   Eyes:  Perrla, eomi, no scleral icterus  Neck:  No JVD, no TMG  Cardiovascular:  Normal rate, regular rhythm, no rubs or gallops.  No murmurs        Intact distal pulses  Pulmonary :  Normal breath sounds, no stridor or respiratory distress   No rales, rhonchi, or wheezing  Abdominal:  Soft, nondistended, bowel sounds present.  No tenderness noted.   Musculoskeletal:  minimal lower extremity edema noted.  Lymph Nodes:  No cervical lymphadenopathy noted  Skin:  No cyanosis noted  Neurologic:  sleepy, appropriate, moves all 4 extremities without obvious deficit.         Assessment & Plan:

## 2011-07-06 NOTE — Assessment & Plan Note (Signed)
The patient has a history of obstructive sleep apnea with improvement on CPAP, however has now been off the device for many years.  She is having extremely poor sleep with obvious apnea, and has severe daytime sleepiness.  She states that her weight has significantly increased since her initial study.  At this point, I would like to get her restarted on CPAP, and will eventually need to have her pressure adjusted.  She understands that her insurance may require her to have a new sleep study.  I've also encouraged her to work aggressively on weight loss.

## 2011-07-06 NOTE — Patient Instructions (Signed)
Will get you a new cpap machine, and get the pressure adjusted for you. Your insurance may require you to have a new sleep study, although I do not think this is necessary. Work on weight loss followup with me in 6 weeks to check on your progress.

## 2011-07-13 ENCOUNTER — Telehealth: Payer: Self-pay | Admitting: Family Medicine

## 2011-07-13 MED ORDER — FLUCONAZOLE 150 MG PO TABS: ORAL_TABLET | ORAL | Status: DC

## 2011-07-13 MED ORDER — SULFAMETHOXAZOLE-TRIMETHOPRIM 800-160 MG PO TABS: 1.0000 | ORAL_TABLET | Freq: Two times a day (BID) | ORAL | Status: AC

## 2011-07-13 MED ORDER — PREDNISONE 20 MG PO TABS: 20.0000 mg | ORAL_TABLET | Freq: Two times a day (BID) | ORAL | Status: AC

## 2011-07-13 NOTE — Telephone Encounter (Signed)
Per Padonda, d/c macrobid. Send Bactrim, prednisone, and diflucan to pharmacy  Pt aware

## 2011-07-13 NOTE — Telephone Encounter (Signed)
Pt was here on 4/22 because she'd had a reaction to Cipro. She was put on Macrobid, and not same issues. She is having to urinate all of the time, and she always feels like she has to go. Pt has hives, and her hemhrroids are worse than ever. Pt needs relief, but at this point cannot continue to afford to come in, pay the co-pay, and then pay for more medicine. Wants to know if we have any samples, or what you recommend. Please call ASAP. She is in constant pain.

## 2011-08-17 ENCOUNTER — Ambulatory Visit: Payer: Medicare Other | Admitting: Pulmonary Disease

## 2011-08-18 ENCOUNTER — Ambulatory Visit: Payer: Medicare Other | Admitting: Pulmonary Disease

## 2011-08-18 ENCOUNTER — Telehealth: Payer: Self-pay

## 2011-08-18 ENCOUNTER — Encounter: Payer: Self-pay | Admitting: Internal Medicine

## 2011-08-18 ENCOUNTER — Ambulatory Visit (INDEPENDENT_AMBULATORY_CARE_PROVIDER_SITE_OTHER): Payer: Medicare Other | Admitting: Internal Medicine

## 2011-08-18 VITALS — BP 150/96 | Temp 98.1°F | Wt 213.0 lb

## 2011-08-18 DIAGNOSIS — K649 Unspecified hemorrhoids: Secondary | ICD-10-CM

## 2011-08-18 DIAGNOSIS — I10 Essential (primary) hypertension: Secondary | ICD-10-CM

## 2011-08-18 DIAGNOSIS — N390 Urinary tract infection, site not specified: Secondary | ICD-10-CM

## 2011-08-18 DIAGNOSIS — R300 Dysuria: Secondary | ICD-10-CM

## 2011-08-18 LAB — POCT URINALYSIS DIPSTICK
Glucose, UA: NEGATIVE
Ketones, UA: NEGATIVE
Spec Grav, UA: 1.02

## 2011-08-18 MED ORDER — HYDROCODONE-ACETAMINOPHEN 7.5-500 MG PO TABS: 2.0000 | ORAL_TABLET | Freq: Four times a day (QID) | ORAL | Status: AC | PRN

## 2011-08-18 MED ORDER — TRIMETHOPRIM 100 MG PO TABS: 100.0000 mg | ORAL_TABLET | Freq: Two times a day (BID) | ORAL | Status: AC

## 2011-08-18 NOTE — Telephone Encounter (Signed)
Pt was seen in April for UTI, treated, but was allergic to the antibx first prescribed.  It was changed to another antibx, which she said also made her break out, but she took it with benadryl.  Is still having trouble "making water"  Appt made for 4:15 today.

## 2011-08-18 NOTE — Progress Notes (Signed)
  Subjective:    Patient ID: Miranda [name], female    DOB: [birth year], 66 y.o.   MRN: [id number]  HPI  66 year old patient who is seen today for followup. She has had a difficult time with recurrent UTIs complicated by allergies to antibiotic therapy she was treated with Macrobid and developed hives and sulfa developed a rash on Bactrim DS. She has persistent burning dysuria She also has symptomatic external hemorrhoids they're persisted in spite of sitz baths and topical therapy. She is requesting surgical referral She has treated hypertension.    Review of Systems  Constitutional: Negative.   HENT: Negative for hearing loss, congestion, sore throat, rhinorrhea, dental problem, sinus pressure and tinnitus.   Eyes: Negative for pain, discharge and visual disturbance.  Respiratory: Negative for cough and shortness of breath.   Cardiovascular: Negative for chest pain, palpitations and leg swelling.  Gastrointestinal: Positive for rectal pain. Negative for nausea, vomiting, abdominal pain, diarrhea, constipation, blood in stool and abdominal distention.  Genitourinary: Positive for dysuria and frequency. Negative for urgency, hematuria, flank pain, vaginal bleeding, vaginal discharge, difficulty urinating, vaginal pain and pelvic pain.  Musculoskeletal: Negative for joint swelling, arthralgias and gait problem.  Skin: Negative for rash.  Neurological: Negative for dizziness, syncope, speech difficulty, weakness, numbness and headaches.  Hematological: Negative for adenopathy.  Psychiatric/Behavioral: Negative for behavioral problems, dysphoric mood and agitation. The patient is not nervous/anxious.        Objective:   Physical Exam  Constitutional: She is oriented to person, place, and time. She appears well-developed and well-nourished.       Uncomfortable Blood pressure 150/90  HENT:  Head: Normocephalic.  Right Ear: External ear normal.  Left Ear: External ear normal.    Mouth/Throat: Oropharynx is clear and moist.  Eyes: Conjunctivae and EOM are normal. Pupils are equal, round, and reactive to light.  Neck: Normal range of motion. Neck supple. No thyromegaly present.  Cardiovascular: Normal rate, regular rhythm, normal heart sounds and intact distal pulses.   Pulmonary/Chest: Effort normal and breath sounds normal.  Abdominal: Soft. Bowel sounds are normal. She exhibits no mass. There is no tenderness.  Musculoskeletal: Normal range of motion.  Lymphadenopathy:    She has no cervical adenopathy.  Neurological: She is alert and oriented to person, place, and time.  Skin: Skin is warm and dry. No rash noted.  Psychiatric: She has a normal mood and affect. Her behavior is normal.          Assessment & Plan:   UTI. Patient has multiple antibiotic allergies. She has been unable to complete a full antibiotic regimen do to allergic reactions. Her most recent allergy was to Bactrim. Will send a urine culture and sensitivity and treat with Trimpex. Symptomatic hemorrhoids. We'll set her for surgical referral. We'll continue topical therapy and sitz baths Hypertension not well-controlled today but quite uncomfortable. We'll continue present regimen and recheck in a few weeks

## 2011-08-18 NOTE — Patient Instructions (Addendum)
Take your antibiotic as prescribed until ALL of it is gone, but stop if you develop a rash, swelling, or any side effects of the medication.  Contact our office as soon as possible if  there are side effects of the medication.  Surgical referral as discussed  Limit your sodium (Salt) intake  Please check your blood pressure on a regular basis.  If it is consistently greater than 150/90, please make an office appointment.  Return in one month for follow-up

## 2011-08-20 LAB — URINE CULTURE: Colony Count: >=100000

## 2011-08-26 ENCOUNTER — Ambulatory Visit: Payer: Medicare Other | Admitting: Pulmonary Disease

## 2011-09-11 ENCOUNTER — Ambulatory Visit (INDEPENDENT_AMBULATORY_CARE_PROVIDER_SITE_OTHER): Payer: Medicare Other | Admitting: General Surgery

## 2011-09-11 ENCOUNTER — Encounter (INDEPENDENT_AMBULATORY_CARE_PROVIDER_SITE_OTHER): Payer: Self-pay | Admitting: General Surgery

## 2011-09-11 VITALS — BP 132/82 | HR 60 | Temp 98.4°F | Resp 18 | Ht 65.5 in | Wt 207.2 lb

## 2011-09-11 DIAGNOSIS — K644 Residual hemorrhoidal skin tags: Secondary | ICD-10-CM

## 2011-09-11 DIAGNOSIS — K602 Anal fissure, unspecified: Secondary | ICD-10-CM

## 2011-09-11 NOTE — Patient Instructions (Signed)
You will need to do fleets enema night before and morning of surgery.  I will see you back around 3-4 weeks after surgery.  You should be prepared to eat soft foods/ soups after surgery.

## 2011-09-13 DIAGNOSIS — K644 Residual hemorrhoidal skin tags: Secondary | ICD-10-CM | POA: Insufficient documentation

## 2011-09-13 NOTE — Assessment & Plan Note (Signed)
I suspect that her symptoms are coming from an anal fissure.  She cannot tolerate an internal exam, even with my smallest finger.   She is having so much pain that she is taking narcotics for it.  I reviewed the choices of taking a relaxing cream such as diltiazem or nitroglycerin, or going straight for surgery.  She is in so much pain, that she would like to go straight for EUA and possible fissurectomy and lateral sphincterotomy.  I advised her that I may not find a fissure, and that in that case, I would perform hemorrhoidectomy only.    In any event, she cannot tolerate in office exam or procedure.  It will be around 3-4 weeks before I can get her on the operating room schedule.  I have given her a script for rectiv to use in the meantime.  Should she have relief of symptoms, we will cancel surgery.

## 2011-09-13 NOTE — Progress Notes (Signed)
Chief Complaint  Patient presents with  . Hemorrhoids    new pt- eval hems    HISTORY: Pt is 65 year old female with significant medical problems who presents with anal pain and bleeding. She reports having hemorrhoids for 30 years.  She has had issues on and off with bleeding, but this would generally resolve spontaneously.  Now, she has had severe discomfort and bleeding in her anus for around 3 months.   She is always constipated and has to strain to have BMs.  She takes 4 dulcolax, 2 stool softeners, and sometimes golytely to have bowel movements.  She is on some narcotics for low back pain, but she says she only takes around 3 vicodin per week because of the constipation.  She is very frustrated and has tried many over the counter remedies as well as "some prescription white cream" without relief.    Past Medical History  Diagnosis Date  . GERD (gastroesophageal reflux disease)   . Hyperlipidemia   . Hypertension   . Low back pain   . Asthma   . Colon polyps   . Sleep apnea     do not use c-pap  . Hemorrhoids     Past Surgical History  Procedure Date  . Cardiac catheterization   . Colonoscopy     remote  . Abdominal hysterectomy     ectopic, fibroids  . Foot surgery     bilat  . Flexible sigmoidoscopy 02/25/2011    Procedure: FLEXIBLE SIGMOIDOSCOPY;  Surgeon: Robert D Kaplan, MD;  Location: [facility];  Service: Endoscopy;  Laterality: N/A;    Current Outpatient Prescriptions  Medication Sig Dispense Refill  . acetaminophen (TYLENOL) 500 MG tablet Take 1,000 mg by mouth every 6 (six) hours as needed. For pain.       . albuterol (PROVENTIL HFA;VENTOLIN HFA) 108 (90 BASE) MCG/ACT inhaler Inhale 2 puffs into the lungs every 6 (six) hours as needed for wheezing.  1 Inhaler  3  . amLODipine (NORVASC) 5 MG tablet Take 1 tablet (5 mg total) by mouth daily.  90 tablet  6  . benazepril-hydrochlorthiazide (LOTENSIN HCT) 20-12.5 MG per tablet 2 tablets daily  90 tablet  6  .  cyclobenzaprine (FLEXERIL) 10 MG tablet Take 10 mg by mouth 3 (three) times daily as needed. For pain.      . fluconazole (DIFLUCAN) 150 MG tablet Take one tablet today and one tablet in 1 week  2 tablet  0  . Fluticasone-Salmeterol (ADVAIR) 250-50 MCG/DOSE AEPB Inhale 1 puff into the lungs every 12 (twelve) hours.  60 each  6  . furosemide (LASIX) 40 MG tablet Take 1 tablet (40 mg total) by mouth daily.  90 tablet  5  . HYDROcodone-acetaminophen (LORTAB) 7.5-500 MG per tablet Ad lib.      . LORazepam (ATIVAN) 0.5 MG tablet Take 1 tablet (0.5 mg total) by mouth 2 (two) times daily as needed. For anxiety.  60 tablet  2  . lovastatin (MEVACOR) 40 MG tablet Take 1 tablet (40 mg total) by mouth at bedtime.  90 tablet  6  . metoprolol (LOPRESSOR) 50 MG tablet Take 1 tablet (50 mg total) by mouth 2 (two) times daily.  90 tablet  6  . omeprazole (PRILOSEC) 20 MG capsule Take 2 capsules (40 mg total) by mouth daily.  30 capsule  6  . trimethoprim (TRIMPEX) 100 MG tablet          Allergies  Allergen Reactions  . Aspirin       REACTION: rash  . Ciprofloxacin     Rash/hives/itch  . Fish Oil     REACTION: throat swelling  . Ibuprofen     REACTION: rash  . Ivp Dye (Iodinated Diagnostic Agents)   . Macrobid (Nitrofurantoin Monohydrate Macrocrystals) Hives  . Penicillins     REACTION: swelling  . Bactrim (Sulfamethoxazole W-Trimethoprim) Rash     Family History  Problem Relation Age of Onset  . Cancer Mother     breast  . Heart attack Mother   . Heart disease Mother   . Cancer Other     breast, colon, lung  . Heart disease Other   . Anesthesia problems Neg Hx   . Emphysema Sister   . Cancer Sister     breast  . Asthma Sister   . Heart disease Sister   . Rheum arthritis Sister   . Cancer Brother     colon     History   Social History  . Marital Status: Married    Spouse Name: N/A    Number of Children: N/A  . Years of Education: N/A   Occupational History  . retired CNA     Social History Main Topics  . Smoking status: Never Smoker   . Smokeless tobacco: Never Used  . Alcohol Use: No  . Drug Use: No  . Sexually Active: None   Other Topics Concern  . None   Social History Narrative  . None     REVIEW OF SYSTEMS - PERTINENT POSITIVES ONLY: 12 point review of systems negative other than HPI and PMH except for visual changes, cough, wheezing, chest pain, leg swelling, abdominal pain, constipation, nausea, difficulty urinating, arthritis pain, headaches, weakness, and rash.    EXAM: Filed Vitals:   09/11/11 1008  BP: 132/82  Pulse: 60  Temp: 98.4 F (36.9 C)  Resp: 18    Gen:  Looks uncomfortable.  Does not want to sit flat on bottom.  Well nourished and well groomed.   Neurological: Alert and oriented to person, place, and time. Coordination normal.  Head: Normocephalic and atraumatic.  Eyes: Conjunctivae are normal. Pupils are equal, round, and reactive to light. No scleral icterus.  Neck: Normal range of motion. Cardiovascular: Normal rate, regular rhythm. Respiratory: Effort normal.  No respiratory distress.  GI: Soft. The abdomen is soft and nontender.  There is no rebound and no guarding.  Musculoskeletal: Antalgic gait.  Rectal:  Several small external hemorrhoids.  ? Fissure with sentinel tag anteriorly.  Severe pain on attempted digital exam which was aborted.   Skin: Skin is warm and dry. No rash noted. No diaphoresis. No erythema. No pallor. No clubbing, cyanosis, or edema.   Psychiatric: Very anxious. Behavior is normal. Judgment and thought content normal.    LABORATORY RESULTS: Available labs are reviewed    RADIOLOGY RESULTS: See E-Chart or I-Site for most recent results.  Images and reports are reviewed.    ASSESSMENT AND PLAN: Anal fissure I suspect that her symptoms are coming from an anal fissure.  She cannot tolerate an internal exam, even with my smallest finger.   She is having so much pain that she is taking  narcotics for it.  I reviewed the choices of taking a relaxing cream such as diltiazem or nitroglycerin, or going straight for surgery.  She is in so much pain, that she would like to go straight for EUA and possible fissurectomy and lateral sphincterotomy.  I advised her that I may not find a   fissure, and that in that case, I would perform hemorrhoidectomy only.    In any event, she cannot tolerate in office exam or procedure.  It will be around 3-4 weeks before I can get her on the operating room schedule.  I have given her a script for rectiv to use in the meantime.  Should she have relief of symptoms, we will cancel surgery.       Jasiri Hanawalt L Stevana Dufner MD Surgical Oncology, General and Endocrine Surgery [facility]      Visit Diagnoses: 1. External hemorrhoid   2. Anal fissure     Primary Care Physician: KWIATKOWSKI,PETER FRANK, MD    

## 2011-09-14 ENCOUNTER — Telehealth: Payer: Self-pay | Admitting: Internal Medicine

## 2011-09-14 ENCOUNTER — Emergency Department (HOSPITAL_COMMUNITY): Payer: Medicare Other

## 2011-09-14 ENCOUNTER — Encounter (HOSPITAL_COMMUNITY): Payer: Self-pay | Admitting: *Deleted

## 2011-09-14 ENCOUNTER — Inpatient Hospital Stay (HOSPITAL_COMMUNITY)
Admission: EM | Admit: 2011-09-14 | Discharge: 2011-09-17 | DRG: 690 | Disposition: A | Discharge status: Home / Self Care | Payer: Medicare Other | Attending: Internal Medicine | Admitting: Internal Medicine

## 2011-09-14 DIAGNOSIS — K644 Residual hemorrhoidal skin tags: Secondary | ICD-10-CM

## 2011-09-14 DIAGNOSIS — D126 Benign neoplasm of colon, unspecified: Secondary | ICD-10-CM | POA: Diagnosis present

## 2011-09-14 DIAGNOSIS — J680 Bronchitis and pneumonitis due to chemicals, gases, fumes and vapors: Secondary | ICD-10-CM

## 2011-09-14 DIAGNOSIS — E876 Hypokalemia: Secondary | ICD-10-CM | POA: Diagnosis present

## 2011-09-14 DIAGNOSIS — IMO0002 Reserved for concepts with insufficient information to code with codable children: Secondary | ICD-10-CM | POA: Diagnosis present

## 2011-09-14 DIAGNOSIS — I10 Essential (primary) hypertension: Secondary | ICD-10-CM | POA: Diagnosis present

## 2011-09-14 DIAGNOSIS — M1990 Unspecified osteoarthritis, unspecified site: Secondary | ICD-10-CM | POA: Diagnosis present

## 2011-09-14 DIAGNOSIS — N12 Tubulo-interstitial nephritis, not specified as acute or chronic: Principal | ICD-10-CM | POA: Diagnosis present

## 2011-09-14 DIAGNOSIS — E785 Hyperlipidemia, unspecified: Secondary | ICD-10-CM | POA: Diagnosis present

## 2011-09-14 DIAGNOSIS — N200 Calculus of kidney: Secondary | ICD-10-CM | POA: Diagnosis present

## 2011-09-14 DIAGNOSIS — G4730 Sleep apnea, unspecified: Secondary | ICD-10-CM | POA: Diagnosis present

## 2011-09-14 DIAGNOSIS — N10 Acute tubulo-interstitial nephritis: Secondary | ICD-10-CM

## 2011-09-14 DIAGNOSIS — J45909 Unspecified asthma, uncomplicated: Secondary | ICD-10-CM | POA: Diagnosis present

## 2011-09-14 DIAGNOSIS — K219 Gastro-esophageal reflux disease without esophagitis: Secondary | ICD-10-CM | POA: Diagnosis present

## 2011-09-14 DIAGNOSIS — K602 Anal fissure, unspecified: Secondary | ICD-10-CM

## 2011-09-14 HISTORY — DX: Reserved for concepts with insufficient information to code with codable children: IMO0002

## 2011-09-14 LAB — CBC
HCT: 39.8 % (ref 36.0–46.0)
Hemoglobin: 12.9 g/dL (ref 12.0–15.0)
MCH: 27.3 pg (ref 26.0–34.0)
MCH: 27.9 pg (ref 26.0–34.0)
MCHC: 32.4 g/dL (ref 30.0–36.0)
MCV: 84.1 fL (ref 78.0–100.0)
Platelets: 177 10*3/uL (ref 150–400)
RBC: 4.59 MIL/uL (ref 3.87–5.11)
RDW: 14.3 % (ref 11.5–15.5)
WBC: 8 10*3/uL (ref 4.0–10.5)

## 2011-09-14 LAB — BASIC METABOLIC PANEL
BUN: 12 mg/dL (ref 6–23)
Calcium: 9.4 mg/dL (ref 8.4–10.5)
Creatinine, Ser: 0.69 mg/dL (ref 0.50–1.10)
GFR calc Af Amer: >90 mL/min (ref >90)
GFR calc non Af Amer: >90 mL/min (ref >90)
Glucose, Bld: 82 mg/dL (ref 70–99)
Potassium: 4.3 mEq/L (ref 3.5–5.1)

## 2011-09-14 LAB — PROTIME-INR: Prothrombin Time: 14 seconds (ref 11.6–15.2)

## 2011-09-14 LAB — URINALYSIS, ROUTINE W REFLEX MICROSCOPIC
Glucose, UA: NEGATIVE mg/dL
Specific Gravity, Urine: 1.023 (ref 1.005–1.030)
pH: 6.5 (ref 5.0–8.0)

## 2011-09-14 LAB — COMPREHENSIVE METABOLIC PANEL
Alkaline Phosphatase: 46 U/L (ref 39–117)
BUN: 9 mg/dL (ref 6–23)
Calcium: 9.4 mg/dL (ref 8.4–10.5)
GFR calc Af Amer: >90 mL/min (ref >90)
Glucose, Bld: 151 mg/dL — ABNORMAL HIGH (ref 70–99)
Total Protein: 7 g/dL (ref 6.0–8.3)

## 2011-09-14 LAB — DIFFERENTIAL
Basophils Absolute: 0 10*3/uL (ref 0.0–0.1)
Basophils Relative: 0 % (ref 0–1)
Eosinophils Absolute: 0.2 10*3/uL (ref 0.0–0.7)
Lymphs Abs: 2.4 10*3/uL (ref 0.7–4.0)
Neutrophils Relative %: 59 % (ref 43–77)

## 2011-09-14 LAB — APTT: aPTT: 26 seconds (ref 24–37)

## 2011-09-14 LAB — URINE MICROSCOPIC-ADD ON

## 2011-09-14 LAB — MAGNESIUM: Magnesium: 2.2 mg/dL (ref 1.5–2.5)

## 2011-09-14 MED ORDER — AMLODIPINE BESYLATE 5 MG PO TABS
5.0000 mg | ORAL_TABLET | Freq: Every day | ORAL | Status: DC
  Administered 2011-09-14 – 2011-09-17 (×4): 5 mg via ORAL
  Filled 2011-09-14 (×4): qty 1

## 2011-09-14 MED ORDER — ALBUTEROL SULFATE HFA 108 (90 BASE) MCG/ACT IN AERS
2.0000 | INHALATION_SPRAY | Freq: Four times a day (QID) | RESPIRATORY_TRACT | Status: DC | PRN
  Filled 2011-09-14: qty 6.7

## 2011-09-14 MED ORDER — CYCLOBENZAPRINE HCL 10 MG PO TABS
10.0000 mg | ORAL_TABLET | Freq: Three times a day (TID) | ORAL | Status: DC | PRN
  Filled 2011-09-14: qty 1

## 2011-09-14 MED ORDER — MORPHINE BOLUS VIA INFUSION: 2.0000 mg | INTRAVENOUS | Status: DC | PRN

## 2011-09-14 MED ORDER — SIMVASTATIN 20 MG PO TABS
20.0000 mg | ORAL_TABLET | Freq: Every day | ORAL | Status: DC
  Administered 2011-09-15 – 2011-09-16 (×2): 20 mg via ORAL
  Filled 2011-09-14 (×5): qty 1

## 2011-09-14 MED ORDER — HYDROCODONE-ACETAMINOPHEN 5-325 MG PO TABS
1.0000 | ORAL_TABLET | ORAL | Status: DC | PRN
  Administered 2011-09-14: 1 via ORAL
  Filled 2011-09-14: qty 1

## 2011-09-14 MED ORDER — METOPROLOL TARTRATE 50 MG PO TABS
50.0000 mg | ORAL_TABLET | Freq: Two times a day (BID) | ORAL | Status: DC
  Administered 2011-09-14 – 2011-09-17 (×4): 50 mg via ORAL
  Filled 2011-09-14 (×7): qty 1

## 2011-09-14 MED ORDER — LORAZEPAM 0.5 MG PO TABS: 0.5000 mg | ORAL_TABLET | Freq: Two times a day (BID) | ORAL | Status: DC | PRN

## 2011-09-14 MED ORDER — FLUTICASONE-SALMETEROL 250-50 MCG/DOSE IN AEPB
1.0000 | INHALATION_SPRAY | Freq: Two times a day (BID) | RESPIRATORY_TRACT | Status: DC
Start: 2011-09-14 — End: 2011-09-17
  Administered 2011-09-15 – 2011-09-17 (×5): 1 via RESPIRATORY_TRACT
  Filled 2011-09-14: qty 14

## 2011-09-14 MED ORDER — HYDROCHLOROTHIAZIDE 12.5 MG PO CAPS
12.5000 mg | ORAL_CAPSULE | Freq: Every day | ORAL | Status: DC
  Administered 2011-09-14 – 2011-09-17 (×4): 12.5 mg via ORAL
  Filled 2011-09-14 (×4): qty 1

## 2011-09-14 MED ORDER — BENAZEPRIL HCL 20 MG PO TABS
20.0000 mg | ORAL_TABLET | Freq: Every day | ORAL | Status: DC
  Administered 2011-09-14 – 2011-09-17 (×4): 20 mg via ORAL
  Filled 2011-09-14 (×4): qty 1

## 2011-09-14 MED ORDER — SODIUM CHLORIDE 0.9 % IV SOLN
INTRAVENOUS | Status: DC
  Administered 2011-09-14 – 2011-09-15 (×4): via INTRAVENOUS

## 2011-09-14 MED ORDER — BISACODYL 5 MG PO TBEC
40.0000 mg | DELAYED_RELEASE_TABLET | Freq: Every day | ORAL | Status: DC | PRN
  Filled 2011-09-14: qty 4

## 2011-09-14 MED ORDER — SODIUM CHLORIDE 0.9 % IV SOLN: INTRAVENOUS | Status: AC

## 2011-09-14 MED ORDER — BENAZEPRIL-HYDROCHLOROTHIAZIDE 20-12.5 MG PO TABS: 1.0000 | ORAL_TABLET | Freq: Every day | ORAL | Status: DC

## 2011-09-14 MED ORDER — SIMETHICONE 80 MG PO CHEW
160.0000 mg | CHEWABLE_TABLET | Freq: Every day | ORAL | Status: DC | PRN
  Administered 2011-09-14: 160 mg via ORAL
  Filled 2011-09-14: qty 2

## 2011-09-14 MED ORDER — MORPHINE SULFATE 2 MG/ML IJ SOLN
2.0000 mg | INTRAMUSCULAR | Status: DC | PRN
  Administered 2011-09-14 – 2011-09-16 (×10): 2 mg via INTRAVENOUS
  Filled 2011-09-14 (×10): qty 1

## 2011-09-14 MED ORDER — FUROSEMIDE 40 MG PO TABS
40.0000 mg | ORAL_TABLET | Freq: Every day | ORAL | Status: DC
  Administered 2011-09-15 – 2011-09-17 (×3): 40 mg via ORAL
  Filled 2011-09-14 (×3): qty 1

## 2011-09-14 MED ORDER — BISACODYL 5 MG PO TBEC
20.0000 mg | DELAYED_RELEASE_TABLET | Freq: Every day | ORAL | Status: DC | PRN
  Administered 2011-09-14 – 2011-09-16 (×2): 20 mg via ORAL
  Filled 2011-09-14: qty 4

## 2011-09-14 MED ORDER — MORPHINE SULFATE 4 MG/ML IJ SOLN
4.0000 mg | INTRAMUSCULAR | Status: AC | PRN
  Administered 2011-09-14 (×2): 4 mg via INTRAVENOUS
  Filled 2011-09-14 (×2): qty 1

## 2011-09-14 MED ORDER — ONDANSETRON HCL 4 MG/2ML IJ SOLN: 4.0000 mg | Freq: Four times a day (QID) | INTRAMUSCULAR | Status: DC | PRN

## 2011-09-14 MED ORDER — SODIUM CHLORIDE 0.9 % IV SOLN
500.0000 mg | Freq: Four times a day (QID) | INTRAVENOUS | Status: DC
  Administered 2011-09-14 – 2011-09-17 (×10): 500 mg via INTRAVENOUS
  Filled 2011-09-14 (×15): qty 500

## 2011-09-14 MED ORDER — ONDANSETRON HCL 4 MG/2ML IJ SOLN: 4.0000 mg | Freq: Three times a day (TID) | INTRAMUSCULAR | Status: AC | PRN

## 2011-09-14 MED ORDER — DOCUSATE SODIUM 100 MG PO CAPS
200.0000 mg | ORAL_CAPSULE | Freq: Two times a day (BID) | ORAL | Status: DC
  Administered 2011-09-14 – 2011-09-17 (×6): 200 mg via ORAL
  Filled 2011-09-14 (×7): qty 2

## 2011-09-14 MED ORDER — ONDANSETRON HCL 4 MG/2ML IJ SOLN
4.0000 mg | INTRAMUSCULAR | Status: DC | PRN
  Administered 2011-09-14: 4 mg via INTRAVENOUS
  Filled 2011-09-14: qty 2

## 2011-09-14 MED ORDER — PANTOPRAZOLE SODIUM 40 MG PO TBEC
40.0000 mg | DELAYED_RELEASE_TABLET | Freq: Every day | ORAL | Status: DC
  Administered 2011-09-14 – 2011-09-16 (×3): 40 mg via ORAL
  Filled 2011-09-14 (×5): qty 1

## 2011-09-14 NOTE — H&P (Addendum)
[facility] History and Physical  Miranda [name] ZOX:[id number] DOB: 04-18-1945 DOA: 09/14/2011  Referring physician: ED PCP: Rogelia Boga, MD   Chief Complaint: flank pain  HPI:  66 year old female with multiple co morbidities who presented to ED with worsening back pain/flank pain mostly on left side which started about 2 weeks prior to admission. Patient reports she was being treated for UTI by PCP but symptoms of flank pain persisted and today patient reported she was not able to ambulate. Patient is 10/10 in intensity and patient describes associated subjective fever at home, burning sensation on urination and blood in urine. Patient reports no abdominal pain, no nausea or vomiting. No lightheadedness and no loss of consciousness.  Review of Systems:   Constitutional: positive for subjective fever; f Negative for diaphoresis.  HENT: Negative for hearing loss, ear pain, nosebleeds, congestion, sore throat, neck pain, tinnitus and ear discharge.   Eyes: Negative for blurred vision, double vision, photophobia, pain, discharge and redness.  Respiratory: Negative for cough, hemoptysis, sputum production, shortness of breath, wheezing and stridor.   Cardiovascular: Negative for chest pain, palpitations, orthopnea, claudication and leg swelling.  Gastrointestinal: Negative for nausea, vomiting and abdominal pain. Negative for heartburn, constipation, blood in stool and melena.  Genitourinary: per HPI Musculoskeletal: Negative for myalgias, back pain, joint pain and falls.  Skin: Negative for itching and rash.  Neurological: Negative for dizziness and weakness. Negative for tingling, tremors, sensory change, speech change, focal weakness, loss of consciousness and headaches.  Endo/Heme/Allergies: Negative for environmental allergies and polydipsia. Does not bruise/bleed easily.  Psychiatric/Behavioral: Negative for suicidal ideas. The patient is not nervous/anxious.       Past Medical History  Diagnosis Date  . GERD (gastroesophageal reflux disease)   . Hyperlipidemia   . Hypertension   . Low back pain   . Asthma   . Colon polyps   . Sleep apnea     do not use c-pap  . Hemorrhoids    Past Surgical History  Procedure Date  . Cardiac catheterization   . Colonoscopy     remote  . Abdominal hysterectomy     ectopic, fibroids  . Foot surgery     bilat  . Flexible sigmoidoscopy 02/25/2011    Procedure: FLEXIBLE SIGMOIDOSCOPY;  Surgeon: Louis Meckel, MD;  Location: [facility];  Service: Endoscopy;  Laterality: N/A;   Social History:  reports that she has never smoked. She has never used smokeless tobacco. She reports that she does not drink alcohol or use illicit drugs.  Allergies  Allergen Reactions  . Aspirin     REACTION: rash  . Ciprofloxacin     Rash/hives/itch  . Fish Oil     REACTION: throat swelling  . Ibuprofen     REACTION: rash  . Influenza Vaccines Hives  . Ivp Dye (Iodinated Diagnostic Agents)   . Macrobid (Nitrofurantoin Monohydrate Macrocrystals) Hives  . Penicillins     REACTION: swelling  . Shellfish Allergy Hives    Patient also allergic to seafood  . Bactrim (Sulfamethoxazole W-Trimethoprim) Rash    Family History  Problem Relation Age of Onset  . Cancer Mother     breast  . Heart attack Mother   . Heart disease Mother   . Cancer Other     breast, colon, lung  . Heart disease Other   . Anesthesia problems Neg Hx   . Emphysema Sister   . Cancer Sister     breast  . Asthma Sister   .  Heart disease Sister   . Rheum arthritis Sister   . Cancer Brother     colon    Prior to Admission medications   Medication Sig Start Date End Date Taking? Authorizing Provider  albuterol (PROVENTIL HFA;VENTOLIN HFA) 108 (90 BASE) MCG/ACT inhaler Inhale 2 puffs into the lungs every 6 (six) hours as needed. 04/08/11 04/07/12 Yes Gordy Savers, MD  amLODipine (NORVASC) 5 MG tablet Take 5 mg by mouth daily. 04/06/11   Yes Gordy Savers, MD  benazepril-hydrochlorthiazide (LOTENSIN HCT) 20-12.5 MG per tablet Take 1 tablet by mouth daily. 2 tablets daily 06/10/11  Yes Gordy Savers, MD  cyclobenzaprine (FLEXERIL) 10 MG tablet Take 10 mg by mouth 3 (three) times daily as needed. For pain.   Yes Historical Provider, MD  Fluticasone-Salmeterol (ADVAIR) 250-50 MCG/DOSE AEPB Inhale 1 puff into the lungs every 12 (twelve) hours. 04/06/11  Yes Gordy Savers, MD  furosemide (LASIX) 40 MG tablet Take 40 mg by mouth daily. 04/06/11  Yes Gordy Savers, MD  HYDROcodone-acetaminophen (LORTAB) 7.5-500 MG per tablet Ad lib. 09/04/11  Yes Historical Provider, MD  LORazepam (ATIVAN) 0.5 MG tablet Take 0.5 mg by mouth 2 (two) times daily as needed. For anxiety. 04/06/11  Yes Gordy Savers, MD  lovastatin (MEVACOR) 40 MG tablet Take 40 mg by mouth at bedtime. 04/06/11  Yes Gordy Savers, MD  metoprolol (LOPRESSOR) 50 MG tablet Take 50 mg by mouth 2 (two) times daily. 04/06/11  Yes Gordy Savers, MD  omeprazole (PRILOSEC) 20 MG capsule Take 2 capsules (40 mg total) by mouth daily. 04/06/11 04/05/12 Yes Gordy Savers, MD   Physical Exam: Filed Vitals:   09/14/11 1113 09/14/11 1145 09/14/11 1422 09/14/11 1625  BP: 183/83 179/86 147/59 185/75  Pulse: 73 64 61 67  Temp: 98.3 F (36.8 C) 98.3 F (36.8 C) 98.5 F (36.9 C) 98.2 F (36.8 C)  TempSrc: Oral Oral Oral Oral  Resp: 16 20 16 18   Height:    5' 5.5" (1.664 m)  Weight:    211 lb 13.8 oz (96.1 kg)  SpO2: 99% 100% 98% 95%  BP 185/75  Pulse 67  Temp 98.2 F (36.8 C) (Oral)  Resp 18  Ht 5' 5.5" (1.664 m)  Wt 211 lb 13.8 oz (96.1 kg)  BMI 34.72 kg/m2  SpO2 95%  General Appearance:    Alert, cooperative, no distress, appears stated age  Head:    Normocephalic, without obvious abnormality, atraumatic  Eyes:    PERRL, conjunctiva/corneas clear, EOM's intact, fundi    benign, both eyes  Ears:    Normal TM's and external ear canals,  both ears  Nose:   Nares normal, septum midline, mucosa normal, no drainage    or sinus tenderness  Throat:   Lips, mucosa, and tongue normal; teeth and gums normal  Neck:   Supple, symmetrical, trachea midline, no adenopathy;    thyroid:  no enlargement/tenderness/nodules; no carotid   bruit or JVD  Back:     Symmetric, no curvature, ROM normal, (+) CVA tenderness  Lungs:     Clear to auscultation bilaterally, respirations unlabored  Chest Wall:    No tenderness or deformity   Heart:    Regular rate and rhythm, S1 and S2 normal, no murmur, rub   or gallop  Breast Exam:    No tenderness, masses, or nipple abnormality  Abdomen:     Soft, non-tender, bowel sounds active all four quadrants,    no masses,  no organomegaly; costovertebral tenderness  Extremities:   Extremities normal, atraumatic, no cyanosis or edema  Pulses:   2+ and symmetric all extremities  Skin:   Skin color, texture, turgor normal, no rashes or lesions  Lymph nodes:   Cervical, supraclavicular, and axillary nodes normal  Neurologic:   CNII-XII intact, normal strength, sensation and reflexes    throughout     Labs on Admission:  Basic Metabolic Panel:  Lab 09/14/11 1610  NA 140  K 4.3  CL 104  CO2 28  GLUCOSE 82  BUN 12  CREATININE 0.69  CALCIUM 9.4  MG --  PHOS --   Liver Function Tests: No results found for this basename: AST:5,ALT:5,ALKPHOS:5,BILITOT:5,PROT:5,ALBUMIN:5 in the last 168 hours No results found for this basename: LIPASE:5,AMYLASE:5 in the last 168 hours No results found for this basename: AMMONIA:5 in the last 168 hours CBC:  Lab 09/14/11 1138  WBC 6.2  NEUTROABS --  HGB 12.9  HCT 39.8  MCV 84.1  PLT 198   Cardiac Enzymes: No results found for this basename: CKTOTAL:5,CKMB:5,CKMBINDEX:5,TROPONINI:5 in the last 168 hours BNP: No components found with this basename: POCBNP:5 CBG: No results found for this basename: GLUCAP:5 in the last 168 hours  Radiological Exams on  Admission: Ct Abdomen Pelvis Wo Contrast  09/14/2011  *RADIOLOGY REPORT*  Clinical Data: Left flank pain.  Constipation.  CT ABDOMEN AND PELVIS WITHOUT CONTRAST  Technique:  Multidetector CT imaging of the abdomen and pelvis was performed following the standard protocol without intravenous contrast.  Comparison: 02/06/2011.  Findings: Lung bases show mild subpleural fibrotic changes.  Heart is at the upper limits of normal in size.  No pericardial or pleural effusion.  Liver, gallbladder, adrenal glands, kidneys, spleen, pancreas, stomach and bowel unremarkable.  There may be trace pelvic free fluid.  Small right inguinal hernia contains fat.  Atherosclerotic calcification of the arterial vasculature without abdominal aortic aneurysm.  No worrisome lytic or sclerotic lesions.  Degenerative changes are seen in the spine.  IMPRESSION:  1.  No findings to explain the patient's left flank pain. 2.  Possible mild constipation. 3.  Suspect mild subpleural pulmonary fibrosis.  Original Report Authenticated By: [name] Ivan, M.D.    EKG: Independently reviewed. Non acute  Assessment/Plan  Active Problems: Pyelonephritis - will continue primaxin for now - continue IV fluids - continue analgesia with morphine 2 mg Q 2 hours PRN IV   Code Status: full code Family Communication: none at bedside Disposition Plan: home when stable  Manson Passey, MD  [facility] Pager [phone]  If 7PM-7AM, please contact night-coverage [email or web address] Password [email or web address] 09/14/2011, 4:33 PM  Time spent: 45 minutes

## 2011-09-14 NOTE — Telephone Encounter (Signed)
Pt. calling.  PCP: Madelin Headings.; CB#: [phone]; Has left flank pain for a week.  The pain has worsened.  Completed medication for a UTI recently.  Has blood in her urine.   She is in severe pain.  No appts. available this am.  Sent to ED for evaluation.

## 2011-09-14 NOTE — ED Notes (Signed)
MD at bedside. 

## 2011-09-14 NOTE — ED Notes (Signed)
Pt c/o of lower left sided back pain that started last Friday. States that she has had problems with hemorrhoids and constipation. Also reports she is suppose to go have surgery Friday. Pain upon urination, movement makes it worse.

## 2011-09-14 NOTE — Progress Notes (Signed)
ANTIBIOTIC CONSULT NOTE - INITIAL  Pharmacy Consult for Primaxin Indication: UTI/Pyelonephritis  Allergies  Allergen Reactions  . Aspirin     REACTION: rash  . Ciprofloxacin     Rash/hives/itch  . Fish Oil     REACTION: throat swelling  . Ibuprofen     REACTION: rash  . Influenza Vaccines Hives  . Ivp Dye (Iodinated Diagnostic Agents)   . Macrobid (Nitrofurantoin Monohydrate Macrocrystals) Hives  . Penicillins     REACTION: swelling  . Shellfish Allergy Hives    Patient also allergic to seafood  . Bactrim (Sulfamethoxazole W-Trimethoprim) Rash    Patient Measurements:   09/11/11 wt = 94 kg  Vital Signs: Temp: 98.5 F (36.9 C) (07/01 1422) Temp src: Oral (07/01 1422) BP: 147/59 mmHg (07/01 1422) Pulse Rate: 61  (07/01 1422) Intake/Output from previous day:   Intake/Output from this shift:    Labs:  Basename 09/14/11 1138  WBC 6.2  HGB 12.9  PLT 198  LABCREA --  CREATININE 0.69   The CrCl is unknown because both a height and weight (above a minimum accepted value) are required for this calculation. No results found for this basename: VANCOTROUGH:2,VANCOPEAK:2,VANCORANDOM:2,GENTTROUGH:2,GENTPEAK:2,GENTRANDOM:2,TOBRATROUGH:2,TOBRAPEAK:2,TOBRARND:2,AMIKACINPEAK:2,AMIKACINTROU:2,AMIKACIN:2, in the last 72 hours   Microbiology: Recent Results (from the past 720 hour(s))  URINE CULTURE     Status: Normal   Collection Time   08/18/11  5:08 PM      Component Value Range Status Comment   Colony Count >=100,000 COLONIES/ML   Final    Organism ID, Bacteria Multiple bacterial morphotypes present, none   Final    Organism ID, Bacteria predominant. Suggest appropriate recollection if    Final    Organism ID, Bacteria clinically indicated.   Final     Medical History: Past Medical History  Diagnosis Date  . GERD (gastroesophageal reflux disease)   . Hyperlipidemia   . Hypertension   . Low back pain   . Asthma   . Colon polyps   . Sleep apnea     do not use c-pap    . Hemorrhoids     Medications:  Anti-infectives    None     Assessment: 66 yo F presented to the ER on 09/14/11 c/o L flank pain x1-2 weeks. Now to start Primaxin per Pharmacy for UTI/Pyelo. No H&P available at this time. CrCl(n)~92. Wt=94 kg on 09/11/11.  Plan:  1) Primaxin 500mg  IV q6h 2) F/U ht, wt, and H&P when available.  Darrol Angel, PharmD Pager: [phone] 09/14/2011,3:08 PM

## 2011-09-14 NOTE — ED Notes (Signed)
Report given to Christy RN

## 2011-09-14 NOTE — ED Notes (Signed)
Patient transported to X-ray 

## 2011-09-14 NOTE — ED Notes (Signed)
Pt reports L flank pain x1-2 weeks. Pain worse with movement, pain upon urination, hematuria. Aching, sharp pain.

## 2011-09-14 NOTE — ED Provider Notes (Signed)
History     CSN: [id number]  Arrival date & time 09/14/11  1043   First MD Initiated Contact with Patient 09/14/11 1133      Chief Complaint  Patient presents with  . Flank Pain    HPI Pt was seen at 1145.   Per pt, c/o gradual onset and worsening of persistent left sided flank "pain" for the past 3 days.  Describes the pain as "aching" and "sharp."  States for the past 2 weeks she has been tx for UTI by her PMD with multiple antibiotic courses without relief of symptoms.  Describes her symptoms of UTI as dysuria and urinary frequency.  Denies fevers, no N/V/D, no vaginal bleeding/discharge, no abd pain, no rash, no CP/SOB.     Past Medical History  Diagnosis Date  . GERD (gastroesophageal reflux disease)   . Hyperlipidemia   . Hypertension   . Low back pain   . Asthma   . Colon polyps   . Sleep apnea     do not use c-pap  . Hemorrhoids     Past Surgical History  Procedure Date  . Cardiac catheterization   . Colonoscopy     remote  . Abdominal hysterectomy     ectopic, fibroids  . Foot surgery     bilat  . Flexible sigmoidoscopy 02/25/2011    Procedure: FLEXIBLE SIGMOIDOSCOPY;  Surgeon: Louis Meckel, MD;  Location: [facility];  Service: Endoscopy;  Laterality: N/A;    Family History  Problem Relation Age of Onset  . Cancer Mother     breast  . Heart attack Mother   . Heart disease Mother   . Cancer Other     breast, colon, lung  . Heart disease Other   . Anesthesia problems Neg Hx   . Emphysema Sister   . Cancer Sister     breast  . Asthma Sister   . Heart disease Sister   . Rheum arthritis Sister   . Cancer Brother     colon    History  Substance Use Topics  . Smoking status: Never Smoker   . Smokeless tobacco: Never Used  . Alcohol Use: No    Review of Systems ROS: Statement: All systems negative except as marked or noted in the HPI; Constitutional: Negative for fever. ; ; Eyes: Negative for eye pain, redness and discharge. ; ; ENMT:  Negative for ear pain, hoarseness, nasal congestion, sinus pressure and sore throat. ; ; Cardiovascular: Negative for chest pain, palpitations, diaphoresis, dyspnea and peripheral edema. ; ; Respiratory: Negative for cough, wheezing and stridor. ; ; Gastrointestinal: Negative for nausea, vomiting, diarrhea, abdominal pain, blood in stool, hematemesis, jaundice and rectal bleeding. . ; ; Genitourinary: +dysuria, flank pain. Negative for hematuria. ; ; Musculoskeletal: Negative for back pain and neck pain. Negative for swelling and trauma.; ; Skin: Negative for pruritus, rash, abrasions, blisters, bruising and skin lesion.; ; Neuro: Negative for headache, lightheadedness and neck stiffness. Negative for weakness, altered level of consciousness , altered mental status, extremity weakness, paresthesias, involuntary movement, seizure and syncope.      Allergies  Aspirin; Ciprofloxacin; Fish oil; Ibuprofen; Ivp dye; Macrobid; Penicillins; and Bactrim  Home Medications   Current Outpatient Rx  Name Route Sig Dispense Refill  . ALBUTEROL SULFATE HFA 108 (90 BASE) MCG/ACT IN AERS Inhalation Inhale 2 puffs into the lungs every 6 (six) hours as needed.    Marland Kitchen AMLODIPINE BESYLATE 5 MG PO TABS Oral Take 5 mg by mouth daily.    Marland Kitchen  BENAZEPRIL-HYDROCHLOROTHIAZIDE 20-12.5 MG PO TABS Oral Take 1 tablet by mouth daily. 2 tablets daily    . CYCLOBENZAPRINE HCL 10 MG PO TABS Oral Take 10 mg by mouth 3 (three) times daily as needed. For pain.    Marland Kitchen FLUTICASONE-SALMETEROL 250-50 MCG/DOSE IN AEPB Inhalation Inhale 1 puff into the lungs every 12 (twelve) hours. 60 each 6  . FUROSEMIDE 40 MG PO TABS Oral Take 40 mg by mouth daily.    Marland Kitchen HYDROCODONE-ACETAMINOPHEN 7.5-500 MG PO TABS  Ad lib.    Marland Kitchen LORAZEPAM 0.5 MG PO TABS Oral Take 0.5 mg by mouth 2 (two) times daily as needed. For anxiety.    Marland Kitchen LOVASTATIN 40 MG PO TABS Oral Take 40 mg by mouth at bedtime.    Marland Kitchen METOPROLOL TARTRATE 50 MG PO TABS Oral Take 50 mg by mouth 2 (two)  times daily.    Marland Kitchen OMEPRAZOLE 20 MG PO CPDR Oral Take 2 capsules (40 mg total) by mouth daily. 30 capsule 6    BP 147/59  Pulse 61  Temp 98.5 F (36.9 C) (Oral)  Resp 16  SpO2 98%  Physical Exam 1150: Physical examination:  Nursing notes reviewed; Vital signs and O2 SAT reviewed;  Constitutional: Well developed, Well nourished, Uncomfortable appearing; Head:  Normocephalic, atraumatic; Eyes: EOMI, PERRL, No scleral icterus; ENMT: Mouth and pharynx normal, Mucous membranes dry; Neck: Supple, Full range of motion, No lymphadenopathy; Cardiovascular: Regular rate and rhythm, No murmur, rub, or gallop; Respiratory: Breath sounds clear & equal bilaterally, No rales, rhonchi, wheezes.  Speaking full sentences with ease, Normal respiratory effort/excursion; Chest: Nontender, Movement normal; Abdomen: Soft, Nontender, Nondistended, Normal bowel sounds; Genitourinary: +left CVA tenderness; Spine:  No midline CS, TS, LS tenderness.  +TTP left lumbar paraspinal muscles.;; Extremities: Pulses normal, No tenderness, No edema, No calf edema or asymmetry.; Neuro: AA&Ox3, Major CN grossly intact.  Speech clear. No gross focal motor or sensory deficits in extremities.; Skin: Color normal, Warm, Dry, no rash.   ED Course  Procedures   MDM  MDM Reviewed: nursing note, vitals and previous chart Interpretation: labs and x-ray   Results for orders placed during the hospital encounter of 09/14/11  URINALYSIS, ROUTINE W REFLEX MICROSCOPIC      Component Value Range   Color, Urine YELLOW  YELLOW   APPearance CLOUDY (*) CLEAR   Specific Gravity, Urine 1.023  1.005 - 1.030   pH 6.5  5.0 - 8.0   Glucose, UA NEGATIVE  NEGATIVE mg/dL   Hgb urine dipstick TRACE (*) NEGATIVE   Bilirubin Urine NEGATIVE  NEGATIVE   Ketones, ur NEGATIVE  NEGATIVE mg/dL   Protein, ur NEGATIVE  NEGATIVE mg/dL   Urobilinogen, UA 0.2  0.0 - 1.0 mg/dL   Nitrite NEGATIVE  NEGATIVE   Leukocytes, UA LARGE (*) NEGATIVE  CBC       Component Value Range   WBC 6.2  4.0 - 10.5 K/uL   RBC 4.73  3.87 - 5.11 MIL/uL   Hemoglobin 12.9  12.0 - 15.0 g/dL   HCT 16.1  09.6 - 04.5 %   MCV 84.1  78.0 - 100.0 fL   MCH 27.3  26.0 - 34.0 pg   MCHC 32.4  30.0 - 36.0 g/dL   RDW 40.9  81.1 - 91.4 %   Platelets 198  150 - 400 K/uL  BASIC METABOLIC PANEL      Component Value Range   Sodium 140  135 - 145 mEq/L   Potassium 4.3  3.5 - 5.1  mEq/L   Chloride 104  96 - 112 mEq/L   CO2 28  19 - 32 mEq/L   Glucose, Bld 82  70 - 99 mg/dL   BUN 12  6 - 23 mg/dL   Creatinine, Ser 4.54  0.50 - 1.10 mg/dL   Calcium 9.4  8.4 - 09.8 mg/dL   GFR calc non Af Amer >90  >90 mL/min   GFR calc Af Amer >90  >90 mL/min  URINE MICROSCOPIC-ADD ON      Component Value Range   Squamous Epithelial / LPF FEW (*) RARE   WBC, UA 11-20  <3 WBC/hpf   RBC / HPF 0-2  <3 RBC/hpf   Bacteria, UA FEW (*) RARE   Urine-Other MUCOUS PRESENT      Ct Abdomen Pelvis Wo Contrast 09/14/2011  *RADIOLOGY REPORT*  Clinical Data: Left flank pain.  Constipation.  CT ABDOMEN AND PELVIS WITHOUT CONTRAST  Technique:  Multidetector CT imaging of the abdomen and pelvis was performed following the standard protocol without intravenous contrast.  Comparison: 02/06/2011.  Findings: Lung bases show mild subpleural fibrotic changes.  Heart is at the upper limits of normal in size.  No pericardial or pleural effusion.  Liver, gallbladder, adrenal glands, kidneys, spleen, pancreas, stomach and bowel unremarkable.  There may be trace pelvic free fluid.  Small right inguinal hernia contains fat.  Atherosclerotic calcification of the arterial vasculature without abdominal aortic aneurysm.  No worrisome lytic or sclerotic lesions.  Degenerative changes are seen in the spine.  IMPRESSION:  1.  No findings to explain the patient's left flank pain. 2.  Possible mild constipation. 3.  Suspect mild subpleural pulmonary fibrosis.  Original Report Authenticated By: [name] Ivan, M.D.       1450:   +UTI on dip, UC pending.  Pt now with pyelonephritis, has been on multiple abx as an outpt with worsening symptoms.  Pt also with multiple abx allergies.  T/C to [facility] Dr. Elisabeth Pigeon, case discussed, including:  HPI, pertinent PM/SHx, VS/PE, dx testing, ED course and treatment:  Agreeable to admit, requests to write temporary orders, obtain medical bed to team 5, and requests to obtain ID consult re: abx for pyelonephritis.  1500:  T/C to ID Dr. Orvan Falconer, case discussed, including:  HPI, pertinent PM/SHx, VS/PE, dx testing, ED course and treatment:  If pt knows she can take keflex, start cefepime; if pt does not know about cephalosporin allergy, start imipenem.  Pt tells me she unsure about ever having taken keflex or a cephalosporin abx previously.   Will start imipenem.         Laray Anger, DO 09/14/11 1949

## 2011-09-15 ENCOUNTER — Inpatient Hospital Stay (HOSPITAL_COMMUNITY): Payer: Medicare Other

## 2011-09-15 DIAGNOSIS — K644 Residual hemorrhoidal skin tags: Secondary | ICD-10-CM

## 2011-09-15 LAB — COMPREHENSIVE METABOLIC PANEL
BUN: 8 mg/dL (ref 6–23)
Calcium: 9.1 mg/dL (ref 8.4–10.5)
Creatinine, Ser: 0.77 mg/dL (ref 0.50–1.10)
GFR calc Af Amer: >90 mL/min (ref >90)
Glucose, Bld: 104 mg/dL — ABNORMAL HIGH (ref 70–99)
Sodium: 137 mEq/L (ref 135–145)
Total Protein: 6.5 g/dL (ref 6.0–8.3)

## 2011-09-15 LAB — CBC
HCT: 36.2 % (ref 36.0–46.0)
Hemoglobin: 11.8 g/dL — ABNORMAL LOW (ref 12.0–15.0)
RBC: 4.26 MIL/uL (ref 3.87–5.11)
WBC: 6.4 10*3/uL (ref 4.0–10.5)

## 2011-09-15 MED ORDER — FLEET ENEMA 7-19 GM/118ML RE ENEM
1.0000 | ENEMA | Freq: Every day | RECTAL | Status: DC | PRN
  Administered 2011-09-15: 1 via RECTAL
  Filled 2011-09-15: qty 1

## 2011-09-15 MED ORDER — ALPRAZOLAM 0.25 MG PO TABS
0.2500 mg | ORAL_TABLET | Freq: Two times a day (BID) | ORAL | Status: DC | PRN
  Administered 2011-09-15: 0.25 mg via ORAL
  Filled 2011-09-15 (×2): qty 1

## 2011-09-15 NOTE — Progress Notes (Signed)
PT Cancellation Note  ___Treatment cancelled today due to medical issues with patient which prohibited   therapy  ___ Treatment cancelled today due to patient receiving procedure or test   ___ Treatment cancelled today due to patient's refusal to participate   _x__ Treatment cancelled today due to attempted several times to evaluate pt. But pt was involved w/ procedures. Will check on 7/3

## 2011-09-15 NOTE — Progress Notes (Signed)
Patient ID: DIVINITY KYLER, female   DOB: May 29, 1945, 66 y.o.   MRN: [id number] [facility] PROGRESS NOTE  MIRIYA CLOER WUJ:[id number] DOB: 11-Dec-1945 DOA: 09/14/2011 PCP: Rogelia Boga, MD  Brief narrative: Pt is 66 year old female with multiple co morbidities who presented to ED 09/14/2011 with worsening back pain/flank pain mostly on left side which started about 2 weeks prior to admission. Patient was treated for UTI by PCP but symptoms of flank pain persisted and was getting progressively worse where pt was unable to ambulate.   Assessment/Plan:  Pyelonephritis - pt is clinically improving but still has mild left flank pain - she is able to tolerate current diet and remains afebrile over 24 hours period - will continue to monitor vitals per floor protocol - continue to advance the diet - will continue to provide supportive care with analgesia and antiemetics as needed  HTN - controlled well on current medication regimen - will continue to monitor vitals per floor protocol  GERD - will continue Protonix  HLD - will continue statin  Consultants:  None  Procedures:  Ct Abdomen Pelvis Wo Contrast: 09/14/2011 --> No findings to explain the patient's left flank pain. Possible mild constipation. Suspect mild subpleural pulmonary fibrosis.   Antibiotics:  Primaxin 07/01 -->  Code Status: Full code Family Communication: Pt at bedside Disposition Plan: Home when stable  Manson Passey, MD  [facility] Pager [phone]  If 7PM-7AM, please contact night-coverage [email or web address] Password [email or web address] 09/15/2011, 11:20 AM   LOS: 1 day   HPI/Subjective: No events overnight. Pt reports pain is better tolerated. She is tolerating current diet and denies nausea or vomiting.   Objective: Filed Vitals:   09/14/11 2314 09/15/11 0600 09/15/11 0745 09/15/11 0916  BP: 120/65 133/66  126/63  Pulse: 69 62  60  Temp: 98.6 F (37 C) 98.5 F (36.9 C)  98.4 F  (36.9 C)  TempSrc: Oral Oral  Oral  Resp: 20 16  14   Height:      Weight:  211 lb (95.709 kg)    SpO2: 92% 97% 97% 96%    Intake/Output Summary (Last 24 hours) at 09/15/11 1120 Last data filed at 09/15/11 1101  Gross per 24 hour  Intake    360 ml  Output      0 ml  Net    360 ml    Exam:   General:  Pt is alert, follows commands appropriately, not in acute distress  Cardiovascular: Regular rate and rhythm, S1/S2, no murmurs, no rubs, no gallops  Respiratory: Clear to auscultation bilaterally, no wheezing, no crackles, no rhonchi  Abdomen: left CVA tenderness, no organomegaly apprecaited  Extremities: No edema, pulses DP and PT palpable bilaterally  Neuro: Grossly nonfocal  Data Reviewed: Basic Metabolic Panel:  Lab 09/15/11 8657 09/14/11 1829 09/14/11 1138  NA 137 141 140  K 3.9 3.8 4.3  CL 105 104 104  CO2 27 29 28   GLUCOSE 104* 151* 82  BUN 8 9 12   CREATININE 0.77 0.72 0.69  CALCIUM 9.1 9.4 9.4   Liver Function Tests:  Lab 09/15/11 0325 09/14/11 1829  AST 15 15  ALT 18 16  ALKPHOS 43 46  BILITOT 0.4 0.4  PROT 6.5 7.0  ALBUMIN 3.3* 3.6   CBC:  Lab 09/15/11 0325 09/14/11 1829 09/14/11 1138  WBC 6.4 8.0 6.2  HGB 11.8* 12.8 12.9  HCT 36.2 38.8 39.8  MCV 85.0 84.5 84.1  PLT 163 177 198   CBG:  Lab  09/15/11 0754  GLUCAP 92   Scheduled Meds:   . amLODipine  5 mg Oral Daily  . benazepril  20 mg Oral Daily  . docusate sodium  200 mg Oral BID  . Fluticasone-Salmeterol  1 puff Inhalation Q12H  . furosemide  40 mg Oral Daily  . hydrochlorothiazide  12.5 mg Oral Daily  . imipenem-cilastatin  500 mg Intravenous Q6H  . metoprolol  50 mg Oral BID  . pantoprazole  40 mg Oral Q1200  . simvastatin  20 mg Oral q1800   Continuous Infusions:   . sodium chloride 100 mL/hr at 09/14/11 2237

## 2011-09-16 ENCOUNTER — Inpatient Hospital Stay (HOSPITAL_COMMUNITY): Payer: Medicare Other

## 2011-09-16 DIAGNOSIS — N12 Tubulo-interstitial nephritis, not specified as acute or chronic: Secondary | ICD-10-CM | POA: Diagnosis present

## 2011-09-16 DIAGNOSIS — E876 Hypokalemia: Secondary | ICD-10-CM | POA: Diagnosis present

## 2011-09-16 LAB — GLUCOSE, CAPILLARY: Glucose-Capillary: 96 mg/dL (ref 70–99)

## 2011-09-16 LAB — CBC
MCH: 27.8 pg (ref 26.0–34.0)
MCV: 84.9 fL (ref 78.0–100.0)
Platelets: 169 10*3/uL (ref 150–400)
RDW: 14.3 % (ref 11.5–15.5)
WBC: 5.5 10*3/uL (ref 4.0–10.5)

## 2011-09-16 LAB — URINE CULTURE

## 2011-09-16 LAB — BASIC METABOLIC PANEL
Calcium: 9.2 mg/dL (ref 8.4–10.5)
Creatinine, Ser: 0.7 mg/dL (ref 0.50–1.10)
GFR calc Af Amer: >90 mL/min (ref >90)
Sodium: 136 mEq/L (ref 135–145)

## 2011-09-16 MED ORDER — SODIUM CHLORIDE 0.9 % IV SOLN
INTRAVENOUS | Status: DC
  Administered 2011-09-16 – 2011-09-17 (×2): via INTRAVENOUS

## 2011-09-16 MED ORDER — POTASSIUM CHLORIDE CRYS ER 20 MEQ PO TBCR
20.0000 meq | EXTENDED_RELEASE_TABLET | Freq: Three times a day (TID) | ORAL | Status: AC
  Administered 2011-09-16 (×3): 20 meq via ORAL
  Filled 2011-09-16 (×4): qty 1

## 2011-09-16 NOTE — Progress Notes (Signed)
ANTIBIOTIC CONSULT NOTE - INITIAL  Pharmacy Consult for Primaxin Indication: UTI/Pyelonephritis  Allergies  Allergen Reactions  . Aspirin     REACTION: rash  . Ciprofloxacin     Rash/hives/itch  . Fish Oil     REACTION: throat swelling  . Ibuprofen     REACTION: rash  . Influenza Vaccines Hives  . Ivp Dye (Iodinated Diagnostic Agents)   . Macrobid (Nitrofurantoin Monohydrate Macrocrystals) Hives  . Penicillins     REACTION: swelling  . Shellfish Allergy Hives    Patient also allergic to seafood  . Bactrim (Sulfamethoxazole W-Trimethoprim) Rash    Patient Measurements: Height: 5' 5.5" (166.4 cm) Weight: 211 lb 4.8 oz (95.845 kg) IBW/kg (Calculated) : 58.15  09/11/11 wt = 94 kg  Vital Signs: Temp: 98 F (36.7 C) (07/03 0519) Temp src: Oral (07/03 0519) BP: 115/70 mmHg (07/03 0519) Pulse Rate: 53  (07/03 0519) Intake/Output from previous day: 07/02 0701 - 07/03 0700 In: 1983 [P.O.:840; I.V.:643; IV Piggyback:500] Out: 900 [Urine:900] Intake/Output from this shift: Total I/O In: 240 [P.O.:240] Out: 300 [Urine:300]  Labs:  [facility] 09/16/11 0315 09/15/11 0325 09/14/11 1829  WBC 5.5 6.4 8.0  HGB 12.5 11.8* 12.8  PLT 169 163 177  LABCREA -- -- --  CREATININE 0.70 0.77 0.72   Estimated Creatinine Clearance: 82.1 ml/min (by C-G formula based on Cr of 0.7). No results found for this basename: VANCOTROUGH:2,VANCOPEAK:2,VANCORANDOM:2,GENTTROUGH:2,GENTPEAK:2,GENTRANDOM:2,TOBRATROUGH:2,TOBRAPEAK:2,TOBRARND:2,AMIKACINPEAK:2,AMIKACINTROU:2,AMIKACIN:2, in the last 72 hours   Microbiology: Recent Results (from the past 720 hour(s))  URINE CULTURE     Status: Normal   Collection Time   08/18/11  5:08 PM      Component Value Range Status Comment   Colony Count >=100,000 COLONIES/ML   Final    Organism ID, Bacteria Multiple bacterial morphotypes present, none   Final    Organism ID, Bacteria predominant. Suggest appropriate recollection if    Final    Organism ID, Bacteria  clinically indicated.   Final   URINE CULTURE     Status: Normal   Collection Time   09/14/11 11:33 AM      Component Value Range Status Comment   Specimen Description URINE, CLEAN CATCH   Final    Special Requests NONE   Final    Culture  Setup Time 09/15/2011 01:29   Final    Colony Count 50,000 COLONIES/ML   Final    Culture     Final    Value: Multiple bacterial morphotypes present, none predominant. Suggest appropriate recollection if clinically indicated.   Report Status 09/16/2011 FINAL   Final     Medical History: Past Medical History  Diagnosis Date  . GERD (gastroesophageal reflux disease)   . Hyperlipidemia   . Hypertension   . Low back pain   . Asthma   . Colon polyps   . Sleep apnea     do not use c-pap  . Hemorrhoids     Medications:  Anti-infectives     Start     Dose/Rate Route Frequency Ordered Stop   09/14/11 1600   imipenem-cilastatin (PRIMAXIN) 500 mg in sodium chloride 0.9 % 100 mL IVPB        500 mg 200 mL/hr over 30 Minutes Intravenous Every 6 hours 09/14/11 1524           Assessment:  2 wk of flank pain PTA, treated as outpt for UTI.  Urine cx on admission; multiple species  On Primaxin; allergy to Penicillin, Quinolones.  Renal function stable  Pain control  an issue.  Plan:  Continue Primaxin 500mg  IV q6h.  Otho Bellows , PharmD Pager: [phone] 09/16/2011,11:15 AM

## 2011-09-16 NOTE — Consult Note (Signed)
Consult: left back pain, possible kidney stone, pyelonephritis Requested by Dr. Darnelle Catalan  History of Present Illness: 66 yo with 2 week history of left lower back pain. Pain is worse with ambulation. Better with rest. She has no history of kidney stones. Pain radiates into left leg. Pain is intermittent, crampy and severe at times.   She had dysuria but no other bothersome LUTS. Her UA looked contaminated and her urine cx grew mixed bacteria. There was no microhematuria. Pt has had no fever or WBC count.   Past Medical History  Diagnosis Date  . GERD (gastroesophageal reflux disease)   . Hyperlipidemia   . Hypertension   . Low back pain   . Asthma   . Colon polyps   . Sleep apnea     do not use c-pap  . Hemorrhoids    Past Surgical History  Procedure Date  . Cardiac catheterization   . Colonoscopy     remote  . Abdominal hysterectomy     ectopic, fibroids  . Foot surgery     bilat  . Flexible sigmoidoscopy 02/25/2011    Procedure: FLEXIBLE SIGMOIDOSCOPY;  Surgeon: Louis Meckel, MD;  Location: [facility];  Service: Endoscopy;  Laterality: N/A;    Home Medications:  Prescriptions prior to admission  Medication Sig Dispense Refill  . albuterol (PROVENTIL HFA;VENTOLIN HFA) 108 (90 BASE) MCG/ACT inhaler Inhale 2 puffs into the lungs every 6 (six) hours as needed.      Marland Kitchen amLODipine (NORVASC) 5 MG tablet Take 5 mg by mouth daily.      . benazepril-hydrochlorthiazide (LOTENSIN HCT) 20-12.5 MG per tablet Take 1 tablet by mouth daily. 2 tablets daily      . bisacodyl (DULCOLAX) 5 MG EC tablet Take 20 mg by mouth daily as needed. For severe constipation.      . cyclobenzaprine (FLEXERIL) 10 MG tablet Take 10 mg by mouth 3 (three) times daily as needed. For pain.      Marland Kitchen Fluticasone-Salmeterol (ADVAIR) 250-50 MCG/DOSE AEPB Inhale 1 puff into the lungs every 12 (twelve) hours.  60 each  6  . furosemide (LASIX) 40 MG tablet Take 40 mg by mouth daily.      Marland Kitchen HYDROcodone-acetaminophen  (LORTAB) 7.5-500 MG per tablet Ad lib.      Marland Kitchen LORazepam (ATIVAN) 0.5 MG tablet Take 0.5 mg by mouth 2 (two) times daily as needed. For anxiety.      . lovastatin (MEVACOR) 40 MG tablet Take 40 mg by mouth at bedtime.      . metoprolol (LOPRESSOR) 50 MG tablet Take 50 mg by mouth 2 (two) times daily.      Marland Kitchen omeprazole (PRILOSEC) 20 MG capsule Take 2 capsules (40 mg total) by mouth daily.  30 capsule  6  . simethicone (MYLICON) 80 MG chewable tablet Chew 160 mg by mouth every 6 (six) hours as needed. For gas.       Allergies:  Allergies  Allergen Reactions  . Fish Oil Anaphylaxis    REACTION: throat swelling  . Aspirin     REACTION: rash  . Ciprofloxacin Hives    Rash/hives/itch  . Influenza Vaccines Hives  . Ivp Dye (Iodinated Diagnostic Agents)   . Macrobid (Nitrofurantoin Monohydrate Macrocrystals) Hives  . Penicillins     REACTION: swelling  . Shellfish Allergy Hives    Patient also allergic to seafood  . Bactrim (Sulfamethoxazole W-Trimethoprim) Rash  . Ibuprofen Rash    REACTION: rash    Family History  Problem  Relation Age of Onset  . Cancer Mother     breast  . Heart attack Mother   . Heart disease Mother   . Cancer Other     breast, colon, lung  . Heart disease Other   . Anesthesia problems Neg Hx   . Emphysema Sister   . Cancer Sister     breast  . Asthma Sister   . Heart disease Sister   . Rheum arthritis Sister   . Cancer Brother     colon   Social History:  reports that she has never smoked. She has never used smokeless tobacco. She reports that she does not drink alcohol or use illicit drugs.  ROS: A complete review of systems was performed.  All systems are negative except for pertinent findings as noted. @ROS @   Physical Exam:  Vital signs in last 24 hours: Temp:  [98 F (36.7 C)-98.5 F (36.9 C)] 98.5 F (36.9 C) (07/03 1420) Pulse Rate:  [53-60] 60  (07/03 1420) Resp:  [16-18] 18  (07/03 1420) BP: (115-153)/(58-76) 130/76 mmHg (07/03  1420) SpO2:  [95 %-98 %] 95 % (07/03 1420) Weight:  [95.845 kg (211 lb 4.8 oz)] 95.845 kg (211 lb 4.8 oz) (07/03 0519) General:  Alert and oriented, No acute distress HEENT: Normocephalic, atraumatic Neck: No JVD or lymphadenopathy Cardiovascular: Regular rate and rhythm Lungs: Regular rate and effort Abdomen: Soft, nontender, nondistended, no abdominal masses Back: No CVA tenderness Extremities: No edema Neurologic: Grossly intact  Laboratory Data:  Results for orders placed during the hospital encounter of 09/14/11 (from the past 24 hour(s))  CBC     Status: Normal   Collection Time   09/16/11  3:15 AM      Component Value Range   WBC 5.5  4.0 - 10.5 K/uL   RBC 4.49  3.87 - 5.11 MIL/uL   Hemoglobin 12.5  12.0 - 15.0 g/dL   HCT 16.1  09.6 - 04.5 %   MCV 84.9  78.0 - 100.0 fL   MCH 27.8  26.0 - 34.0 pg   MCHC 32.8  30.0 - 36.0 g/dL   RDW 40.9  81.1 - 91.4 %   Platelets 169  150 - 400 K/uL  BASIC METABOLIC PANEL     Status: Abnormal   Collection Time   09/16/11  3:15 AM      Component Value Range   Sodium 136  135 - 145 mEq/L   Potassium 3.4 (*) 3.5 - 5.1 mEq/L   Chloride 101  96 - 112 mEq/L   CO2 29  19 - 32 mEq/L   Glucose, Bld 105 (*) 70 - 99 mg/dL   BUN 9  6 - 23 mg/dL   Creatinine, Ser 7.82  0.50 - 1.10 mg/dL   Calcium 9.2  8.4 - 95.6 mg/dL   GFR calc non Af Amer 90 (*) >90 mL/min   GFR calc Af Amer >90  >90 mL/min  GLUCOSE, CAPILLARY     Status: Normal   Collection Time   09/16/11  7:32 AM      Component Value Range   Glucose-Capillary 96  70 - 99 mg/dL   Recent Results (from the past 240 hour(s))  URINE CULTURE     Status: Normal   Collection Time   09/14/11 11:33 AM      Component Value Range Status Comment   Specimen Description URINE, CLEAN CATCH   Final    Special Requests NONE   Final    Culture  Setup Time 09/15/2011 01:29   Final    Colony Count 50,000 COLONIES/ML   Final    Culture     Final    Value: Multiple bacterial morphotypes present, none  predominant. Suggest appropriate recollection if clinically indicated.   Report Status 09/16/2011 FINAL   Final    Creatinine:  Basename 09/16/11 0315 09/15/11 0325 09/14/11 1829 09/14/11 1138  CREATININE 0.70 0.77 0.72 0.69   CT - normal GU tract [place] - no hydronephrosis. Hyperechoic focus in left kidney. No shadowing.  Impression/Assessment:  Back/flank pain - likely musculoskeletal. Hyerpechoic foci are common on U/S and a non-specific finding. There was no shadowing, probably vascular in nature. U/S better for hydronephrosis or large mass and neither were seen. CT much better test for stones and no stones seen.  Plan:  -No GU intervention needed. Discussed with Dr. Darnelle Catalan.  Antony Haste 09/16/2011, 5:16 PM

## 2011-09-16 NOTE — Progress Notes (Signed)
PROGRESS NOTE  Miranda [name]:[id number] DOB: Aug 04, 1945 DOA: 09/14/2011 PCP: Rogelia Boga, MD  Brief narrative: Miranda [name] is a 66 year old female with multiple comorbidities who was admitted on 09/14/2011 with left-sided flank pain ultimately diagnosed with pyelonephritis and possible nephrolithiasis. She is currently receiving IV antibiotics but her pain has not appreciably improved.  Interim History: Stable overnight.   Assessment/Plan: Principal Problem:  *Pyelonephritis with possible nonobstructing 5 mm calculus by renal ultrasound  Admitted and placed on IV antibiotics consisting of Primaxin. Urinalysis had trace blood, large amount of leukocytes, and a few bacteria. Urine culture sent, but was essentially negative, however the patient has had multiple rounds of antibiotics prior to admission.   CT scan of the abdomen on 09/14/2011 essentially negative, but ultrasound done on 09/15/2011 showed a possible nonobstructing stone.  The patient continues to have colicky left-sided flank pain, consistent with a possible stone.  Continue IV antibiotics for now and increase IV fluid rate to help flush out stone.  Requested urological input, given the patient's ongoing complaints of colicky flank pain. Active Problems:  HYPERLIPIDEMIA  Continue statin therapy.  HYPERTENSION  Blood pressure currently controlled on usual anti-hypertensive regimen.  GERD  Continue PPI therapy.  Hypokalemia  Replace orally.   Code Status: Full code  Family Communication: None at bedside. Disposition Plan: Home when stable   Medical Consultants:  Urology  Other consultants:  Pharmacy  Antibiotics:  Primaxin 09/14/11--->  Subjective  Miranda [name] continues to have colicky left-sided sharp flank pain. No nausea or vomiting since admission. States her bowels moved yesterday after she was given an enema. Does not feel well enough to return home due to ongoing pain. No fever or  chills.   Objective   Objective: Filed Vitals:   09/15/11 2122 09/15/11 2146 09/16/11 0519 09/16/11 0805  BP: 153/58 125/73 115/70   Pulse: 58 54 53   Temp:  98.1 F (36.7 C) 98 F (36.7 C)   TempSrc:  Oral Oral   Resp:   16   Height:      Weight:   95.845 kg (211 lb 4.8 oz)   SpO2:  98% 97% 96%    Intake/Output Summary (Last 24 hours) at 09/16/11 0825 Last data filed at 09/15/11 2159  Gross per 24 hour  Intake   1983 ml  Output    900 ml  Net   1083 ml    Exam: Gen:  NAD,  Cardiovascular:  RRR, No M/R/G Respiratory: Lungs CTAB Gastrointestinal: Abdomen soft, NT/ND with normal active bowel sounds. Extremities: No C/E/C    Data Reviewed: Basic Metabolic Panel:  Lab 09/16/11 9562 09/15/11 0325 09/14/11 1829 09/14/11 1138  NA 136 137 141 140  K 3.4* 3.9 -- --  CL 101 105 104 104  CO2 29 27 29 28   GLUCOSE 105* 104* 151* 82  BUN 9 8 9 12   CREATININE 0.70 0.77 0.72 0.69  CALCIUM 9.2 9.1 9.4 9.4  MG -- -- 2.2 --  PHOS -- -- 2.7 --   GFR Estimated Creatinine Clearance: 82.1 ml/min (by C-G formula based on Cr of 0.7). Liver Function Tests:  Lab 09/15/11 0325 09/14/11 1829  AST 15 15  ALT 18 16  ALKPHOS 43 46  BILITOT 0.4 0.4  PROT 6.5 7.0  ALBUMIN 3.3* 3.6   Coagulation profile  Lab 09/14/11 1829  INR 1.06  PROTIME --    CBC:  Lab 09/16/11 0315 09/15/11 0325 09/14/11 1829 09/14/11 1138  WBC 5.5 6.4 8.0 6.2  NEUTROABS -- -- 4.7 --  HGB 12.5 11.8* 12.8 12.9  HCT 38.1 36.2 38.8 39.8  MCV 84.9 85.0 84.5 84.1  PLT 169 163 177 198   CBG:  Lab 09/16/11 0732 09/15/11 0754  GLUCAP 96 92   Thyroid function studies  Basename 09/14/11 1829  TSH 2.081  T4TOTAL --  T3FREE --  THYROIDAB --   Microbiology Recent Results (from the past 240 hour(s))  URINE CULTURE     Status: Normal   Collection Time   09/14/11 11:33 AM      Component Value Range Status Comment   Specimen Description URINE, CLEAN CATCH   Final    Special Requests NONE   Final     Culture  Setup Time 09/15/2011 01:29   Final    Colony Count 50,000 COLONIES/ML   Final    Culture     Final    Value: Multiple bacterial morphotypes present, none predominant. Suggest appropriate recollection if clinically indicated.   Report Status 09/16/2011 FINAL   Final     Procedures and Diagnostic Studies:  Ct Abdomen Pelvis Wo Contrast 09/14/2011 IMPRESSION:  1.  No findings to explain the patient's left flank pain. 2.  Possible mild constipation. 3.  Suspect mild subpleural pulmonary fibrosis.  Original Report Authenticated By: [name] Ivan, M.D.    US Renal 09/15/2011 IMPRESSION: Question nonobstructing calculus 5 mm diameter mid left kidney versus artifact. No left renal calculus is identified on the prior CT exam however. No other definite sonographic abnormalities identified.  Original Report Authenticated By: Lollie Marrow, M.D.    Scheduled Meds:    . sodium chloride   Intravenous STAT  . amLODipine  5 mg Oral Daily  . benazepril  20 mg Oral Daily  . docusate sodium  200 mg Oral BID  . Fluticasone-Salmeterol  1 puff Inhalation Q12H  . furosemide  40 mg Oral Daily  . hydrochlorothiazide  12.5 mg Oral Daily  . imipenem-cilastatin  500 mg Intravenous Q6H  . metoprolol  50 mg Oral BID  . pantoprazole  40 mg Oral Q1200  . potassium chloride  20 mEq Oral TID  . simvastatin  20 mg Oral q1800   Continuous Infusions:    . DISCONTD: sodium chloride 100 mL/hr at 09/15/11 1122      LOS: 2 days   Hillery Aldo, MD Pager [phone]  09/16/2011, 8:25 AM

## 2011-09-16 NOTE — Evaluation (Signed)
Physical Therapy Evaluation Patient Details Name: Miranda [name] MRN: [id number] DOB: 01-02-1946 Today's Date: 09/16/2011 Time: 1345-1400 PT Time Calculation (min): 15 min  PT Assessment / Plan / Recommendation Clinical Impression  pt  admitted w/ L flank pain. found to have kidney stone and pyelonephritis. Pt is experiencing severe sharp pains intermittently which decreases ability to ambulate and also is a safety issue when pt suddenly buckles forward. will follow pt.while on acute. PT will be limited until pains are under control/. Pt will benefit from PT to ensure return to Independent level to DC home.    PT Assessment  Patient needs continued PT services    Follow Up Recommendations  No PT follow up    Barriers to Discharge        Equipment Recommendations  None recommended by PT    Recommendations for Other Services     Frequency Min 3X/week    Precautions / Restrictions     Pertinent Vitals/Pain 8-10/10 when pt has a spasm in flank. Pt does not want to take pain meds.. RN is aware.      Mobility  Transfers Transfers: Sit to Stand;Stand to Sit Sit to Stand: 5: Supervision;From bed;From chair/3-in-1 Stand to Sit: 4: Min guard;With upper extremity assist;To chair/3-in-1;To bed Details for Transfer Assistance: pt guards trunk awaiting for sharp pain she stated Ambulation/Gait Ambulation/Gait Assistance: 3: Mod assist Ambulation Distance (Feet): 30 Feet Assistive device: 1 person hand held assist;Other (Comment) Ambulation/Gait Assistance Details: pt had frequent jolting halts when sharp pains occurred. one pain sharp enough per pt to point pt buckled over. Pt did push self  on and was able to continue to walk back to room w/ mod support. Gait Pattern: Step-through pattern;Trunk flexed Gait velocity: decreased and halting    Exercises     PT Diagnosis: Difficulty walking;Acute pain  PT Problem List: Decreased activity tolerance;Pain PT Treatment Interventions: Gait  training;Functional mobility training;Patient/family education   PT Goals Acute Rehab PT Goals PT Goal Formulation: With patient Time For Goal Achievement: 09/23/11 Potential to Achieve Goals: Good Pt will Ambulate: >150 feet;Independently PT Goal: Ambulate - Progress: Goal set today  Visit Information  Last PT Received On: 09/16/11 Assistance Needed: +1    Subjective Data  Subjective: those sharp pains take my breath away. i have kidney stones. Patient Stated Goal: to walk and not have pain.   Prior Functioning  Home Living Lives With: Spouse Available Help at Discharge: Family Type of Home: House Home Access: Stairs to enter Secretary/administrator of Steps: 3 Entrance Stairs-Rails: Right Home Layout: One level Home Adaptive Equipment: None Prior Function Level of Independence: Independent Able to Take Stairs?: Yes Driving: Yes Communication Communication: No difficulties    Cognition  Overall Cognitive Status: Appears within functional limits for tasks assessed/performed Arousal/Alertness: Awake/alert Orientation Level: Appears intact for tasks assessed Behavior During Session: [facility] for tasks performed    Extremity/Trunk Assessment Right Upper Extremity Assessment RUE ROM/Strength/Tone: [facility] for tasks assessed Left Upper Extremity Assessment LUE ROM/Strength/Tone: WFL for tasks assessed Right Lower Extremity Assessment RLE ROM/Strength/Tone: [facility] for tasks assessed Left Lower Extremity Assessment LLE ROM/Strength/Tone: [facility] for tasks assessed Trunk Assessment Trunk Assessment: Other exceptions Trunk Exceptions: pt tends to flex and lean to   Balance Static Sitting Balance Static Sitting - Level of Assistance: 7: Independent  End of Session PT - End of Session Activity Tolerance: Patient limited by pain Patient left: Other (comment);with call bell/phone within reach (left sitting at edge of bed) Nurse Communication: Mobility  status  GP     Rada Hay 09/16/2011, 3:02 PM  [phone]

## 2011-09-17 ENCOUNTER — Encounter (HOSPITAL_COMMUNITY): Payer: Self-pay | Admitting: Internal Medicine

## 2011-09-17 DIAGNOSIS — IMO0002 Reserved for concepts with insufficient information to code with codable children: Secondary | ICD-10-CM

## 2011-09-17 DIAGNOSIS — M9979 Connective tissue and disc stenosis of intervertebral foramina of abdomen and other regions: Secondary | ICD-10-CM | POA: Insufficient documentation

## 2011-09-17 DIAGNOSIS — M1990 Unspecified osteoarthritis, unspecified site: Secondary | ICD-10-CM | POA: Diagnosis present

## 2011-09-17 HISTORY — DX: Reserved for concepts with insufficient information to code with codable children: IMO0002

## 2011-09-17 MED ORDER — HYDROCODONE-ACETAMINOPHEN 7.5-500 MG PO TABS: 1.0000 | ORAL_TABLET | ORAL | Status: DC | PRN

## 2011-09-17 MED ORDER — ALPRAZOLAM 0.25 MG PO TABS: 0.2500 mg | ORAL_TABLET | Freq: Two times a day (BID) | ORAL | Status: AC | PRN

## 2011-09-17 MED ORDER — CYCLOBENZAPRINE HCL 10 MG PO TABS: 10.0000 mg | ORAL_TABLET | Freq: Three times a day (TID) | ORAL | Status: DC | PRN

## 2011-09-17 NOTE — Discharge Summary (Signed)
Physician Discharge Summary  Patient ID: Miranda [name] MRN: [id number] DOB/AGE: 08-22-1945 66 y.o.  Admit date: 09/14/2011 Discharge date: 09/17/2011  Primary Care Physician:  Rogelia Boga, MD   Discharge Diagnoses:    Principal Problem:  *Pyelonephritis with possible nonobstructing 5 mm calculus by renal ultrasound  Active Problems:   HYPERLIPIDEMIA   HYPERTENSION   GERD   Hypokalemia   Degenerative disc disease    Discharge Medications:  Medication List  As of 09/17/2011  8:56 AM   STOP taking these medications         LORazepam 0.5 MG tablet         TAKE these medications         albuterol 108 (90 BASE) MCG/ACT inhaler   Commonly known as: PROVENTIL HFA;VENTOLIN HFA   Inhale 2 puffs into the lungs every 6 (six) hours as needed.      ALPRAZolam 0.25 MG tablet   Commonly known as: XANAX   Take 1 tablet (0.25 mg total) by mouth 2 (two) times daily as needed for anxiety.      amLODipine 5 MG tablet   Commonly known as: NORVASC   Take 5 mg by mouth daily.      benazepril-hydrochlorthiazide 20-12.5 MG per tablet   Commonly known as: LOTENSIN HCT   Take 1 tablet by mouth daily. 2 tablets daily      bisacodyl 5 MG EC tablet   Commonly known as: DULCOLAX   Take 20 mg by mouth daily as needed. For severe constipation.      cyclobenzaprine 10 MG tablet   Commonly known as: FLEXERIL   Take 1 tablet (10 mg total) by mouth 3 (three) times daily as needed for muscle spasms. For pain.      Fluticasone-Salmeterol 250-50 MCG/DOSE Aepb   Commonly known as: ADVAIR   Inhale 1 puff into the lungs every 12 (twelve) hours.      furosemide 40 MG tablet   Commonly known as: LASIX   Take 40 mg by mouth daily.      HYDROcodone-acetaminophen 7.5-500 MG per tablet   Commonly known as: LORTAB   Take 1 tablet by mouth every 4 (four) hours as needed for pain.      lovastatin 40 MG tablet   Commonly known as: MEVACOR   Take 40 mg by mouth at bedtime.     metoprolol 50 MG tablet   Commonly known as: LOPRESSOR   Take 50 mg by mouth 2 (two) times daily.      omeprazole 20 MG capsule   Commonly known as: PRILOSEC   Take 2 capsules (40 mg total) by mouth daily.      simethicone 80 MG chewable tablet   Commonly known as: MYLICON   Chew 160 mg by mouth every 6 (six) hours as needed. For gas.             Disposition and Follow-up: The patient is being discharged . She is instructed to followup with her primary care physician next week.   Medical Consults:  Dr. Jerilee Field, Urology  Other Consults:  Physical Therapy: Physical therapy followup needed.   Procedures and Diagnostic Studies:   Ct Abdomen Pelvis Wo Contrast 09/14/2011   IMPRESSION:  1.  No findings to explain the patient's left flank pain. 2.  Possible mild constipation. 3.  Suspect mild subpleural pulmonary fibrosis.  Original Report Authenticated By: [name] Ivan, M.D.    Dg Lumbar Spine 2-3 Views 09/16/2011 IMPRESSION: Degenerative changes  throughout the lumbar spine as detailed above.  Original Report Authenticated By: Fuller Canada, M.D.    US Renal 09/15/2011  IMPRESSION: Question nonobstructing calculus 5 mm diameter mid left kidney versus artifact. No left renal calculus is identified on the prior CT exam however. No other definite sonographic abnormalities identified.  Original Report Authenticated By: Lollie Marrow, M.D.    Discharge Laboratory Values: Basic Metabolic Panel:  Lab 09/16/11 5621 09/15/11 0325 09/14/11 1829 09/14/11 1138  NA 136 137 141 140  K 3.4* 3.9 -- --  CL 101 105 104 104  CO2 29 27 29 28   GLUCOSE 105* 104* 151* 82  BUN 9 8 9 12   CREATININE 0.70 0.77 0.72 0.69  CALCIUM 9.2 9.1 9.4 9.4  MG -- -- 2.2 --  PHOS -- -- 2.7 --   GFR Estimated Creatinine Clearance: 84 ml/min (by C-G formula based on Cr of 0.7). Liver Function Tests:  Lab 09/15/11 0325 09/14/11 1829  AST 15 15  ALT 18 16  ALKPHOS 43 46  BILITOT 0.4 0.4  PROT  6.5 7.0  ALBUMIN 3.3* 3.6   Coagulation profile  Lab 09/14/11 1829  INR 1.06  PROTIME --    CBC:  Lab 09/16/11 0315 09/15/11 0325 09/14/11 1829 09/14/11 1138  WBC 5.5 6.4 8.0 6.2  NEUTROABS -- -- 4.7 --  HGB 12.5 11.8* 12.8 12.9  HCT 38.1 36.2 38.8 39.8  MCV 84.9 85.0 84.5 84.1  PLT 169 163 177 198   CBG:  Lab 09/17/11 0724 09/16/11 0732 09/15/11 0754  GLUCAP 119* 96 92   Thyroid function studies  Basename 09/14/11 1829  TSH 2.081  T4TOTAL --  T3FREE --  THYROIDAB --   Microbiology Recent Results (from the past 240 hour(s))  URINE CULTURE     Status: Normal   Collection Time   09/14/11 11:33 AM      Component Value Range Status Comment   Specimen Description URINE, CLEAN CATCH   Final    Special Requests NONE   Final    Culture  Setup Time 09/15/2011 01:29   Final    Colony Count 50,000 COLONIES/ML   Final    Culture     Final    Value: Multiple bacterial morphotypes present, none predominant. Suggest appropriate recollection if clinically indicated.   Report Status 09/16/2011 FINAL   Final      Brief H and P: For complete details please refer to admission H and P, but in brief, Miranda [name] is a 66 year old female with multiple antibiotic allergies who was admitted on 09/14/2011 with left-sided flank pain ultimately diagnosed with pyelonephritis and possible nephrolithiasis.  She had been treated for an outpatient UTI prior to admission and completed a course of Trimethoprim.   Physical Exam at Discharge: BP 137/76  Pulse 59  Temp 98.3 F (36.8 C) (Oral)  Resp 16  Ht 5' 5.5" (1.664 m)  Wt 99.927 kg (220 lb 4.8 oz)  BMI 36.10 kg/m2  SpO2 96% Gen:  NAD Cardiovascular:  RRR, No M/R/G Respiratory: Lungs CTAB Gastrointestinal: Abdomen soft, NT/ND with normal active bowel sounds. Extremities: No C/E/C  Hospital Course:  Principal Problem:  *Pyelonephritis with possible nonobstructing 5 mm calculus by renal ultrasound  Admitted and placed on IV antibiotics  consisting of Primaxin. Urinalysis had trace blood, large amount of leukocytes, and a few bacteria. Urine culture sent, but was essentially negative, however the patient has had multiple rounds of antibiotics prior to admission (Cipro, Trimethoprim).  CT scan  of the abdomen on 09/14/2011 essentially negative, but ultrasound done on 09/15/2011 showed a possible nonobstructing stone.  The patient continued to have colicky left-sided flank pain, consistent with a possible stone (? Stone noted on ultrasound, so Urology consultation requested and kindly provided by Dr. Mena Goes on 09/16/11).  He did not feel the patient had a stone causing her ongoing symptoms.  Maintained on IV Primaxin while in the hospital.  Since urine cultures negative and back pain may be from DDD, will not discharge on any further antibiotics.  Active Problems:  DDD  Noted on plain films.  Patient advised to use pain medication, muscle relaxants and heat PRN for back pain. HYPERLIPIDEMIA  Continue statin therapy. HYPERTENSION  Blood pressure currently controlled on usual anti-hypertensive regimen. GERD  Continue PPI therapy. Hypokalemia  Replaced orally prior to discharge.  Diet:  Low-sodium, heart healthy  Activity:  Increase activity slowly  Condition at Discharge:   Improved  Time spent on Discharge:  25 minutes  Signed: Dr. Trula Ore Rama Pager [phone] 09/17/2011, 8:56 AM

## 2011-09-17 NOTE — Progress Notes (Signed)
Pt discharged to home via wheelchair with family, discharge instructions reviewed with pt who verbalized understanding. New RX 's given to pt.

## 2011-09-23 ENCOUNTER — Encounter (HOSPITAL_COMMUNITY): Payer: Self-pay | Admitting: Respiratory Therapy

## 2011-10-01 ENCOUNTER — Encounter (HOSPITAL_COMMUNITY)
Admission: RE | Admit: 2011-10-01 | Discharge: 2011-10-01 | Disposition: A | Discharge status: Home / Self Care | Payer: Medicare Other | Source: Ambulatory Visit | Attending: General Surgery | Admitting: General Surgery

## 2011-10-01 ENCOUNTER — Ambulatory Visit (HOSPITAL_COMMUNITY)
Admission: RE | Admit: 2011-10-01 | Discharge: 2011-10-01 | Disposition: A | Discharge status: Home / Self Care | Payer: Medicare Other | Source: Ambulatory Visit | Attending: General Surgery | Admitting: General Surgery

## 2011-10-01 ENCOUNTER — Encounter (HOSPITAL_COMMUNITY): Payer: Self-pay

## 2011-10-01 DIAGNOSIS — Z01812 Encounter for preprocedural laboratory examination: Secondary | ICD-10-CM | POA: Insufficient documentation

## 2011-10-01 DIAGNOSIS — Z01810 Encounter for preprocedural cardiovascular examination: Secondary | ICD-10-CM | POA: Insufficient documentation

## 2011-10-01 DIAGNOSIS — K649 Unspecified hemorrhoids: Secondary | ICD-10-CM | POA: Insufficient documentation

## 2011-10-01 DIAGNOSIS — Z01818 Encounter for other preprocedural examination: Secondary | ICD-10-CM | POA: Insufficient documentation

## 2011-10-01 DIAGNOSIS — I7090 Unspecified atherosclerosis: Secondary | ICD-10-CM | POA: Insufficient documentation

## 2011-10-01 HISTORY — DX: Depression, unspecified: F32.A

## 2011-10-01 HISTORY — DX: Other complications of anesthesia, initial encounter: T88.59XA

## 2011-10-01 HISTORY — DX: Anxiety disorder, unspecified: F41.9

## 2011-10-01 HISTORY — DX: Chronic kidney disease, unspecified: N18.9

## 2011-10-01 HISTORY — DX: Unspecified glaucoma: H40.9

## 2011-10-01 HISTORY — DX: Shortness of breath: R06.02

## 2011-10-01 HISTORY — DX: Major depressive disorder, single episode, unspecified: F32.9

## 2011-10-01 HISTORY — DX: Adverse effect of unspecified anesthetic, initial encounter: T41.45XA

## 2011-10-01 HISTORY — DX: Heart failure, unspecified: I50.9

## 2011-10-01 HISTORY — DX: Family history of other specified conditions: Z84.89

## 2011-10-01 LAB — CBC
MCHC: 32.5 g/dL (ref 30.0–36.0)
Platelets: 213 10*3/uL (ref 150–400)
RDW: 14.5 % (ref 11.5–15.5)

## 2011-10-01 LAB — BASIC METABOLIC PANEL
BUN: 13 mg/dL (ref 6–23)
Calcium: 10.1 mg/dL (ref 8.4–10.5)
Creatinine, Ser: 0.72 mg/dL (ref 0.50–1.10)
GFR calc Af Amer: >90 mL/min (ref >90)
GFR calc non Af Amer: 88 mL/min — ABNORMAL LOW (ref >90)

## 2011-10-01 LAB — SURGICAL PCR SCREEN
MRSA, PCR: NEGATIVE
Staphylococcus aureus: POSITIVE — AB

## 2011-10-01 NOTE — Pre-Procedure Instructions (Signed)
20 BRIHANNA DEVENPORT  10/01/2011   Your procedure is scheduled on:  10/08/2011  THURSDAY  Report to [facility] at 10:00 AM.  Call this number if you have problems the morning of surgery: [phone]   Remember:   Do not eat food or drink liquid :After Midnight. On WEDNESDAY    .  Take these medicines the morning of surgery with A SIP OF WATER: ALUTEROL INHALER(BRING INHALER WITH YOU THE DAY OF SURGERY)  NORVASC,  FLEXERIL,  ADVAIR,  LORTAB, XANAX, METOPROLOL, PRILOSEC     Do not wear jewelry, make-up or nail polish.  Do not wear lotions, powders, or perfumes. You may wear deodorant.  Do not shave 48 hours prior to surgery. Men may shave face and neck.  Do not bring valuables to the hospital.  Contacts, dentures or bridgework may not be worn into surgery.  Leave suitcase in the car. After surgery it may be brought to your room.  For patients admitted to the hospital, checkout time is 11:00 AM the day of discharge.   Patients discharged the day of surgery will not be allowed to drive home.  Name and phone number of your driver: /w family  Special Instructions: CHG Shower Use Special Wash: 1/2 bottle night before surgery and 1/2 bottle morning of surgery.   Please read over the following fact sheets that you were given: Pain Booklet, Coughing and Deep Breathing, Lab Information, MRSA Information and Surgical Site Infection Prevention    DISCONTINUE  ASPIRIN PLAVIX COUMADIN EFFIENT AND HERBAL MEDICINES.

## 2011-10-01 NOTE — Progress Notes (Signed)
Pt. States since her sister has passed away & she is no longer caring for her at night she is using the CPAP q night.  States she is scheduled to see Dr. Shelle Iron for a a f/u appt. this week. Pt. Admits to noncompliance to taking Norvasc & using eye gtts for glaucoma.

## 2011-10-02 ENCOUNTER — Ambulatory Visit: Payer: Medicare Other | Admitting: Pulmonary Disease

## 2011-10-07 MED ORDER — CLINDAMYCIN PHOSPHATE 900 MG/50ML IV SOLN
900.0000 mg | Freq: Once | INTRAVENOUS | Status: AC
  Administered 2011-10-08: 900 mg via INTRAVENOUS
  Filled 2011-10-07: qty 50

## 2011-10-08 ENCOUNTER — Encounter (HOSPITAL_COMMUNITY): Payer: Self-pay | Admitting: Anesthesiology

## 2011-10-08 ENCOUNTER — Ambulatory Visit (HOSPITAL_COMMUNITY): Payer: Medicare Other | Admitting: Anesthesiology

## 2011-10-08 ENCOUNTER — Encounter (HOSPITAL_COMMUNITY): Payer: Self-pay | Admitting: *Deleted

## 2011-10-08 ENCOUNTER — Ambulatory Visit (HOSPITAL_COMMUNITY)
Admission: RE | Admit: 2011-10-08 | Discharge: 2011-10-08 | Disposition: A | Discharge status: Home / Self Care | Payer: Medicare Other | Source: Ambulatory Visit | Attending: General Surgery | Admitting: General Surgery

## 2011-10-08 ENCOUNTER — Encounter (HOSPITAL_COMMUNITY)
Admission: RE | Disposition: A | Discharge status: Home / Self Care | Payer: Self-pay | Source: Ambulatory Visit | Attending: General Surgery

## 2011-10-08 ENCOUNTER — Encounter (HOSPITAL_COMMUNITY): Payer: Self-pay | Admitting: General Surgery

## 2011-10-08 DIAGNOSIS — E785 Hyperlipidemia, unspecified: Secondary | ICD-10-CM | POA: Insufficient documentation

## 2011-10-08 DIAGNOSIS — K648 Other hemorrhoids: Secondary | ICD-10-CM

## 2011-10-08 DIAGNOSIS — M545 Low back pain, unspecified: Secondary | ICD-10-CM | POA: Insufficient documentation

## 2011-10-08 DIAGNOSIS — J45909 Unspecified asthma, uncomplicated: Secondary | ICD-10-CM | POA: Insufficient documentation

## 2011-10-08 DIAGNOSIS — I509 Heart failure, unspecified: Secondary | ICD-10-CM | POA: Insufficient documentation

## 2011-10-08 DIAGNOSIS — F3289 Other specified depressive episodes: Secondary | ICD-10-CM | POA: Insufficient documentation

## 2011-10-08 DIAGNOSIS — I10 Essential (primary) hypertension: Secondary | ICD-10-CM | POA: Insufficient documentation

## 2011-10-08 DIAGNOSIS — K602 Anal fissure, unspecified: Secondary | ICD-10-CM | POA: Insufficient documentation

## 2011-10-08 DIAGNOSIS — F411 Generalized anxiety disorder: Secondary | ICD-10-CM | POA: Insufficient documentation

## 2011-10-08 DIAGNOSIS — G4730 Sleep apnea, unspecified: Secondary | ICD-10-CM | POA: Insufficient documentation

## 2011-10-08 DIAGNOSIS — K219 Gastro-esophageal reflux disease without esophagitis: Secondary | ICD-10-CM | POA: Insufficient documentation

## 2011-10-08 DIAGNOSIS — K644 Residual hemorrhoidal skin tags: Secondary | ICD-10-CM | POA: Insufficient documentation

## 2011-10-08 DIAGNOSIS — F329 Major depressive disorder, single episode, unspecified: Secondary | ICD-10-CM | POA: Insufficient documentation

## 2011-10-08 HISTORY — PX: HEMORRHOID SURGERY: SHX153

## 2011-10-08 HISTORY — PX: SPHINCTEROTOMY: SHX5279

## 2011-10-08 HISTORY — PX: FISSURECTOMY: SHX5244

## 2011-10-08 SURGERY — EXAM UNDER ANESTHESIA WITH ANAL FISSUROTOMY
Anesthesia: General | Site: Rectum | Wound class: Clean Contaminated

## 2011-10-08 MED ORDER — ONDANSETRON HCL 4 MG/2ML IJ SOLN
INTRAMUSCULAR | Status: DC | PRN
  Administered 2011-10-08: 4 mg via INTRAVENOUS

## 2011-10-08 MED ORDER — DIBUCAINE 1 % RE OINT
TOPICAL_OINTMENT | RECTAL | Status: AC
  Filled 2011-10-08: qty 28

## 2011-10-08 MED ORDER — ONDANSETRON HCL 4 MG/2ML IJ SOLN
4.0000 mg | Freq: Once | INTRAMUSCULAR | Status: AC | PRN
  Administered 2011-10-08: 4 mg via INTRAVENOUS

## 2011-10-08 MED ORDER — LACTATED RINGERS IV SOLN
INTRAVENOUS | Status: DC | PRN
  Administered 2011-10-08 (×2): via INTRAVENOUS

## 2011-10-08 MED ORDER — ROCURONIUM BROMIDE 100 MG/10ML IV SOLN
INTRAVENOUS | Status: DC | PRN
  Administered 2011-10-08: 30 mg via INTRAVENOUS

## 2011-10-08 MED ORDER — HEMOSTATIC AGENTS (NO CHARGE) OPTIME
TOPICAL | Status: DC | PRN
  Administered 2011-10-08: 1

## 2011-10-08 MED ORDER — PROPOFOL 10 MG/ML IV EMUL
INTRAVENOUS | Status: DC | PRN
  Administered 2011-10-08: 100 mg via INTRAVENOUS

## 2011-10-08 MED ORDER — LIDOCAINE HCL (CARDIAC) 20 MG/ML IV SOLN
INTRAVENOUS | Status: DC | PRN
  Administered 2011-10-08: 30 mg via INTRAVENOUS

## 2011-10-08 MED ORDER — DEXTROSE 5 % IV SOLN
INTRAVENOUS | Status: DC | PRN
  Administered 2011-10-08: 12:00:00 via INTRAVENOUS

## 2011-10-08 MED ORDER — POLYETHYLENE GLYCOL 3350 17 GM/SCOOP PO POWD: 8.5000 g | Freq: Two times a day (BID) | ORAL | Status: AC

## 2011-10-08 MED ORDER — ONDANSETRON HCL 4 MG/2ML IJ SOLN
INTRAMUSCULAR | Status: AC
  Filled 2011-10-08: qty 2

## 2011-10-08 MED ORDER — OXYCODONE HCL 5 MG PO TABS: 5.0000 mg | ORAL_TABLET | ORAL | Status: DC | PRN

## 2011-10-08 MED ORDER — FENTANYL CITRATE 0.05 MG/ML IJ SOLN
INTRAMUSCULAR | Status: DC | PRN
  Administered 2011-10-08: 50 ug via INTRAVENOUS
  Administered 2011-10-08: 100 ug via INTRAVENOUS

## 2011-10-08 MED ORDER — HYDROMORPHONE HCL PF 1 MG/ML IJ SOLN
INTRAMUSCULAR | Status: AC
  Filled 2011-10-08: qty 1

## 2011-10-08 MED ORDER — ACETAMINOPHEN 10 MG/ML IV SOLN: 1000.0000 mg | Freq: Once | INTRAVENOUS | Status: DC | PRN

## 2011-10-08 MED ORDER — BUPIVACAINE LIPOSOME 1.3 % IJ SUSP
20.0000 mL | Freq: Once | INTRAMUSCULAR | Status: AC
  Administered 2011-10-08: 20 mL
  Filled 2011-10-08: qty 20

## 2011-10-08 MED ORDER — HYDROMORPHONE HCL PF 1 MG/ML IJ SOLN
0.2500 mg | INTRAMUSCULAR | Status: DC | PRN
  Administered 2011-10-08 (×3): 0.5 mg via INTRAVENOUS

## 2011-10-08 MED ORDER — DIBUCAINE 1 % EX OINT: TOPICAL_OINTMENT | Freq: Three times a day (TID) | CUTANEOUS | Status: AC | PRN

## 2011-10-08 MED ORDER — DOCUSATE SODIUM 100 MG PO CAPS: 100.0000 mg | ORAL_CAPSULE | Freq: Two times a day (BID) | ORAL | Status: AC

## 2011-10-08 MED ORDER — DIBUCAINE 1 % RE OINT
TOPICAL_OINTMENT | RECTAL | Status: DC | PRN
  Administered 2011-10-08: 1 via RECTAL

## 2011-10-08 MED ORDER — BISACODYL 5 MG PO TBEC: 10.0000 mg | DELAYED_RELEASE_TABLET | Freq: Every day | ORAL | Status: AC

## 2011-10-08 MED ORDER — OXYCODONE-ACETAMINOPHEN 10-325 MG PO TABS: 1.0000 | ORAL_TABLET | ORAL | Status: DC | PRN

## 2011-10-08 MED ORDER — MIDAZOLAM HCL 5 MG/5ML IJ SOLN
INTRAMUSCULAR | Status: DC | PRN
  Administered 2011-10-08: 1 mg via INTRAVENOUS

## 2011-10-08 SURGICAL SUPPLY — 41 items
BLADE SURG 15 STRL LF DISP TIS (BLADE) ×2 IMPLANT
BLADE SURG 15 STRL SS (BLADE) ×1
CANISTER SUCTION 2500CC (MISCELLANEOUS) ×3 IMPLANT
CLOTH BEACON ORANGE TIMEOUT ST (SAFETY) ×3 IMPLANT
COVER MAYO STAND STRL (DRAPES) ×3 IMPLANT
COVER SURGICAL LIGHT HANDLE (MISCELLANEOUS) ×3 IMPLANT
DECANTER SPIKE VIAL GLASS SM (MISCELLANEOUS) ×6 IMPLANT
DRAPE UTILITY 15X26 W/TAPE STR (DRAPE) ×6 IMPLANT
DRSG PAD ABDOMINAL 8X10 ST (GAUZE/BANDAGES/DRESSINGS) ×6 IMPLANT
ELECT CAUTERY BLADE 6.4 (BLADE) ×3 IMPLANT
ELECT REM PT RETURN 9FT ADLT (ELECTROSURGICAL) ×3
ELECTRODE REM PT RTRN 9FT ADLT (ELECTROSURGICAL) ×2 IMPLANT
GLOVE BIO SURGEON STRL SZ 6 (GLOVE) ×3 IMPLANT
GLOVE BIOGEL PI IND STRL 6.5 (GLOVE) ×2 IMPLANT
GLOVE BIOGEL PI INDICATOR 6.5 (GLOVE) ×1
GLOVE SURG SS PI 6.5 STRL IVOR (GLOVE) ×3 IMPLANT
GOWN PREVENTION PLUS XXLARGE (GOWN DISPOSABLE) ×3 IMPLANT
GOWN STRL NON-REIN LRG LVL3 (GOWN DISPOSABLE) ×3 IMPLANT
KIT BASIN OR (CUSTOM PROCEDURE TRAY) ×3 IMPLANT
KIT ROOM TURNOVER OR (KITS) ×3 IMPLANT
NEEDLE HYPO 25GX1X1/2 BEV (NEEDLE) ×3 IMPLANT
NS IRRIG 1000ML POUR BTL (IV SOLUTION) ×3 IMPLANT
PACK LITHOTOMY IV (CUSTOM PROCEDURE TRAY) ×3 IMPLANT
PAD ARMBOARD 7.5X6 YLW CONV (MISCELLANEOUS) ×6 IMPLANT
PENCIL BUTTON HOLSTER BLD 10FT (ELECTRODE) ×3 IMPLANT
SHEARS HARMONIC 9CM CVD (BLADE) IMPLANT
SPECIMEN JAR SMALL (MISCELLANEOUS) ×3 IMPLANT
SPONGE GAUZE 4X4 12PLY (GAUZE/BANDAGES/DRESSINGS) ×3 IMPLANT
SPONGE LAP 18X18 X RAY DECT (DISPOSABLE) ×3 IMPLANT
SPONGE SURGIFOAM ABS GEL 100 (HEMOSTASIS) ×3 IMPLANT
SURGILUBE 2OZ TUBE FLIPTOP (MISCELLANEOUS) ×3 IMPLANT
SUT CHROMIC 3 0 SH 27 (SUTURE) ×3 IMPLANT
SUT CHROMIC 4 0 PS 2 18 (SUTURE) ×3 IMPLANT
SUT CHROMIC 4 0 SH 27 (SUTURE) IMPLANT
SYR CONTROL 10ML LL (SYRINGE) ×3 IMPLANT
TOWEL OR 17X24 6PK STRL BLUE (TOWEL DISPOSABLE) ×3 IMPLANT
TOWEL OR 17X26 10 PK STRL BLUE (TOWEL DISPOSABLE) ×3 IMPLANT
TUBE CONNECTING 12X1/4 (SUCTIONS) ×3 IMPLANT
UNDERPAD 30X30 INCONTINENT (UNDERPADS AND DIAPERS) ×3 IMPLANT
WATER STERILE IRR 1000ML POUR (IV SOLUTION) IMPLANT
YANKAUER SUCT BULB TIP NO VENT (SUCTIONS) ×3 IMPLANT

## 2011-10-08 NOTE — Interval H&P Note (Signed)
History and Physical Interval Note:  10/08/2011 11:18 AM  Miranda [name]  has presented today for surgery, with the diagnosis of anal fissure, hemorrhoids  The various methods of treatment have been discussed with the patient and family. After consideration of risks, benefits and other options for treatment, the patient has consented to  Procedure(s) (LRB): EXAM UNDER ANESTHESIA WITH ANAL FISSUROTOMY (N/A) HEMORRHOIDECTOMY WITH HEMORRHOID BANDING (N/A) SPHINCTEROTOMY (N/A) as a surgical intervention .  The patient's history has been reviewed, patient examined, no change in status, stable for surgery.  I have reviewed the patient's chart and labs.  Questions were answered to the patient's satisfaction.     Lyan Holck

## 2011-10-08 NOTE — Progress Notes (Signed)
Dr Noreene Larsson at bedside and aware of pt's BP during pacu.  States she may cont to be d/c'd home at this time.

## 2011-10-08 NOTE — Op Note (Signed)
PRE-OPERATIVE DIAGNOSIS: anal fissure and hemorrhoids  POST-OPERATIVE DIAGNOSIS:  Same  PROCEDURE:  Procedure(s): Exam under anesthesia, fissurectomy, lateral internal sphincterotomy, external hemorrhoidectomy (anterior)  FINDINGS: Chronic scarred anal fissure on anterior left, prominent circumferential external hemorrhoids with the anterior hemorrhoid being more pedunculated.    SURGEON:  Surgeon(s): Almond Lint, MD  ANESTHESIA:   Local and general   DRAINS: none   LOCAL MEDICATIONS USED:  OTHER Exparel  SPECIMEN:  Source of Specimen:  anterior external hemorrhoid  DISPOSITION OF SPECIMEN:  PATHOLOGY  COUNTS:  YES  PLAN OF CARE: Discharge to home after PACU  PATIENT DISPOSITION:  PACU - hemodynamically stable.   Procedure:  The patient was identified in the holding area. She was taken to the operating room and placed supine on the operating room table. General anesthesia was induced. She was then placed into the low lithotomy position. Her perineum and upper thighs were prepped and draped in standard fashion. Timeout was performed according to the surgical safety checklist. When all was correct, we continued.  The anus was first examined digitally and dilated up to 3 fingerbreadths.  The bullet anoscope was inserted into the anus and the anus was examined circumferentially. There were significant external hemorrhoids circumferentially. The largest was anterior where the hemorrhoid was quite pedunculated. Also anteriorly on the left, a scarred down anal fissure was identified. This was elevated with DeBakey forceps and the edges were trimmed up with Metzenbaum sutures. The chronic scar tissue was resected. The fissure was closed using 3-0 chromic gut suture. The anterior hemorrhoid was grasped with an Allis clamp. The skin circumferentially was incised with a #15 blade. Cautery was used to resect the hemorrhoid. The cautery was used for hemostasis. The skin of the hemorrhoid defect  was closed with a 4-0 chromic gut suture.  The anal canal was dressed with Gelfoam and dibucaine ointment.  ABD pads and mesh underwear were then placed on the patient.  The patient was awakened from anesthesia and taken to the PACU in stable condition. Needle, sponge, and instrument counts were correct. There were no known complications.

## 2011-10-08 NOTE — Anesthesia Procedure Notes (Signed)
Procedure Name: Intubation Date/Time: 10/08/2011 12:17 PM Performed by: Julianne Rice K Pre-anesthesia Checklist: Patient identified, Timeout performed, Emergency Drugs available, Suction available and Patient being monitored Patient Re-evaluated:Patient Re-evaluated prior to inductionOxygen Delivery Method: Circle system utilized Preoxygenation: Pre-oxygenation with 100% oxygen Intubation Type: IV induction Ventilation: Mask ventilation without difficulty Laryngoscope Size: Miller and 2 Grade View: Grade II Tube type: Oral Number of attempts: 1 Airway Equipment and Method: Stylet Placement Confirmation: ETT inserted through vocal cords under direct vision,  breath sounds checked- equal and bilateral and positive ETCO2 Secured at: 24 cm Tube secured with: Tape Dental Injury: Teeth and Oropharynx as per pre-operative assessment

## 2011-10-08 NOTE — Preoperative (Signed)
Beta Blockers   Reason not to administer Beta Blockers:Not Applicable 

## 2011-10-08 NOTE — H&P (View-Only) (Signed)
Chief Complaint  Patient presents with  . Hemorrhoids    new pt- eval hems    HISTORY: Pt is 66 year old female with significant medical problems who presents with anal pain and bleeding. She reports having hemorrhoids for 30 years.  She has had issues on and off with bleeding, but this would generally resolve spontaneously.  Now, she has had severe discomfort and bleeding in her anus for around 3 months.   She is always constipated and has to strain to have BMs.  She takes 4 dulcolax, 2 stool softeners, and sometimes golytely to have bowel movements.  She is on some narcotics for low back pain, but she says she only takes around 3 vicodin per week because of the constipation.  She is very frustrated and has tried many over the counter remedies as well as "some prescription white cream" without relief.    Past Medical History  Diagnosis Date  . GERD (gastroesophageal reflux disease)   . Hyperlipidemia   . Hypertension   . Low back pain   . Asthma   . Colon polyps   . Sleep apnea     do not use c-pap  . Hemorrhoids     Past Surgical History  Procedure Date  . Cardiac catheterization   . Colonoscopy     remote  . Abdominal hysterectomy     ectopic, fibroids  . Foot surgery     bilat  . Flexible sigmoidoscopy 02/25/2011    Procedure: FLEXIBLE SIGMOIDOSCOPY;  Surgeon: Louis Meckel, MD;  Location: [facility];  Service: Endoscopy;  Laterality: N/A;    Current Outpatient Prescriptions  Medication Sig Dispense Refill  . acetaminophen (TYLENOL) 500 MG tablet Take 1,000 mg by mouth every 6 (six) hours as needed. For pain.       Marland Kitchen albuterol (PROVENTIL HFA;VENTOLIN HFA) 108 (90 BASE) MCG/ACT inhaler Inhale 2 puffs into the lungs every 6 (six) hours as needed for wheezing.  1 Inhaler  3  . amLODipine (NORVASC) 5 MG tablet Take 1 tablet (5 mg total) by mouth daily.  90 tablet  6  . benazepril-hydrochlorthiazide (LOTENSIN HCT) 20-12.5 MG per tablet 2 tablets daily  90 tablet  6  .  cyclobenzaprine (FLEXERIL) 10 MG tablet Take 10 mg by mouth 3 (three) times daily as needed. For pain.      . fluconazole (DIFLUCAN) 150 MG tablet Take one tablet today and one tablet in 1 week  2 tablet  0  . Fluticasone-Salmeterol (ADVAIR) 250-50 MCG/DOSE AEPB Inhale 1 puff into the lungs every 12 (twelve) hours.  60 each  6  . furosemide (LASIX) 40 MG tablet Take 1 tablet (40 mg total) by mouth daily.  90 tablet  5  . HYDROcodone-acetaminophen (LORTAB) 7.5-500 MG per tablet Ad lib.      Marland Kitchen LORazepam (ATIVAN) 0.5 MG tablet Take 1 tablet (0.5 mg total) by mouth 2 (two) times daily as needed. For anxiety.  60 tablet  2  . lovastatin (MEVACOR) 40 MG tablet Take 1 tablet (40 mg total) by mouth at bedtime.  90 tablet  6  . metoprolol (LOPRESSOR) 50 MG tablet Take 1 tablet (50 mg total) by mouth 2 (two) times daily.  90 tablet  6  . omeprazole (PRILOSEC) 20 MG capsule Take 2 capsules (40 mg total) by mouth daily.  30 capsule  6  . trimethoprim (TRIMPEX) 100 MG tablet          Allergies  Allergen Reactions  . Aspirin  REACTION: rash  . Ciprofloxacin     Rash/hives/itch  . Fish Oil     REACTION: throat swelling  . Ibuprofen     REACTION: rash  . Ivp Dye (Iodinated Diagnostic Agents)   . Macrobid (Nitrofurantoin Monohydrate Macrocrystals) Hives  . Penicillins     REACTION: swelling  . Bactrim (Sulfamethoxazole W-Trimethoprim) Rash     Family History  Problem Relation Age of Onset  . Cancer Mother     breast  . Heart attack Mother   . Heart disease Mother   . Cancer Other     breast, colon, lung  . Heart disease Other   . Anesthesia problems Neg Hx   . Emphysema Sister   . Cancer Sister     breast  . Asthma Sister   . Heart disease Sister   . Rheum arthritis Sister   . Cancer Brother     colon     History   Social History  . Marital Status: Married    Spouse Name: N/A    Number of Children: N/A  . Years of Education: N/A   Occupational History  . retired Lawyer     Social History Main Topics  . Smoking status: Never Smoker   . Smokeless tobacco: Never Used  . Alcohol Use: No  . Drug Use: No  . Sexually Active: None   Other Topics Concern  . None   Social History Narrative  . None     REVIEW OF SYSTEMS - PERTINENT POSITIVES ONLY: 12 point review of systems negative other than HPI and PMH except for visual changes, cough, wheezing, chest pain, leg swelling, abdominal pain, constipation, nausea, difficulty urinating, arthritis pain, headaches, weakness, and rash.    EXAM: Filed Vitals:   09/11/11 1008  BP: 132/82  Pulse: 60  Temp: 98.4 F (36.9 C)  Resp: 18    Gen:  Looks uncomfortable.  Does not want to sit flat on bottom.  Well nourished and well groomed.   Neurological: Alert and oriented to person, place, and time. Coordination normal.  Head: Normocephalic and atraumatic.  Eyes: Conjunctivae are normal. Pupils are equal, round, and reactive to light. No scleral icterus.  Neck: Normal range of motion. Cardiovascular: Normal rate, regular rhythm. Respiratory: Effort normal.  No respiratory distress.  GI: Soft. The abdomen is soft and nontender.  There is no rebound and no guarding.  Musculoskeletal: Antalgic gait.  Rectal:  Several small external hemorrhoids.  ? Fissure with sentinel tag anteriorly.  Severe pain on attempted digital exam which was aborted.   Skin: Skin is warm and dry. No rash noted. No diaphoresis. No erythema. No pallor. No clubbing, cyanosis, or edema.   Psychiatric: Very anxious. Behavior is normal. Judgment and thought content normal.    LABORATORY RESULTS: Available labs are reviewed    RADIOLOGY RESULTS: See E-Chart or I-Site for most recent results.  Images and reports are reviewed.    ASSESSMENT AND PLAN: Anal fissure I suspect that her symptoms are coming from an anal fissure.  She cannot tolerate an internal exam, even with my smallest finger.   She is having so much pain that she is taking  narcotics for it.  I reviewed the choices of taking a relaxing cream such as diltiazem or nitroglycerin, or going straight for surgery.  She is in so much pain, that she would like to go straight for EUA and possible fissurectomy and lateral sphincterotomy.  I advised her that I may not find a  fissure, and that in that case, I would perform hemorrhoidectomy only.    In any event, she cannot tolerate in office exam or procedure.  It will be around 3-4 weeks before I can get her on the operating room schedule.  I have given her a script for rectiv to use in the meantime.  Should she have relief of symptoms, we will cancel surgery.       Maudry Diego MD Surgical Oncology, General and Endocrine Surgery [facility]      Visit Diagnoses: 1. External hemorrhoid   2. Anal fissure     Primary Care Physician: Rogelia Boga, MD

## 2011-10-08 NOTE — Progress Notes (Signed)
Pt became nauseated getting up to WC.  Zofran administered.  Will monitor.

## 2011-10-08 NOTE — Transfer of Care (Signed)
Immediate Anesthesia Transfer of Care Note  Patient: Miranda [name]  Procedure(s) Performed: Procedure(s) (LRB): EXAM UNDER ANESTHESIA WITH ANAL FISSUROTOMY (N/A) SPHINCTEROTOMY (N/A) FISSURECTOMY (N/A) HEMORRHOIDECTOMY (N/A)  Patient Location: PACU  Anesthesia Type: General  Level of Consciousness: awake, oriented and sedated  Airway & Oxygen Therapy: Patient Spontanous Breathing and Patient connected to nasal cannula oxygen  Post-op Assessment: Report given to PACU RN and Post -op Vital signs reviewed and stable  Post vital signs: Reviewed and stable  Complications: No apparent anesthesia complications

## 2011-10-08 NOTE — Anesthesia Postprocedure Evaluation (Signed)
  Anesthesia Post-op Note  Patient: Miranda [name]  Procedure(s) Performed: Procedure(s) (LRB): EXAM UNDER ANESTHESIA WITH ANAL FISSUROTOMY (N/A) SPHINCTEROTOMY (N/A) FISSURECTOMY (N/A) HEMORRHOIDECTOMY (N/A)  Patient Location: PACU  Anesthesia Type: General  Level of Consciousness: awake, alert  and oriented  Airway and Oxygen Therapy: Patient Spontanous Breathing and Patient connected to nasal cannula oxygen  Post-op Pain: mild  Post-op Assessment: Post-op Vital signs reviewed and Patient's Cardiovascular Status Stable  Post-op Vital Signs: stable  Complications: No apparent anesthesia complications

## 2011-10-08 NOTE — Anesthesia Preprocedure Evaluation (Addendum)
Anesthesia Evaluation  Patient identified by MRN, date of birth, ID band Patient awake    Reviewed: Allergy & Precautions, H&P , NPO status , Patient's Chart, lab work & pertinent test results, reviewed documented beta blocker date and time   History of Anesthesia Complications (+) Family history of anesthesia reaction  Airway       Dental   Pulmonary shortness of breath and with exertion, asthma , sleep apnea and Continuous Positive Airway Pressure Ventilation ,          Cardiovascular hypertension, Pt. on medications and Pt. on home beta blockers +CHF     Neuro/Psych PSYCHIATRIC DISORDERS Anxiety Depression    GI/Hepatic GERD-  Medicated and Controlled,  Endo/Other  negative endocrine ROS  Renal/GU      Musculoskeletal negative musculoskeletal ROS (+)   Abdominal   Peds  Hematology   Anesthesia Other Findings   Reproductive/Obstetrics                           Anesthesia Physical Anesthesia Plan  ASA: III  Anesthesia Plan: General   Post-op Pain Management:    Induction: Intravenous  Airway Management Planned: Oral ETT  Additional Equipment:   Intra-op Plan:   Post-operative Plan: Extubation in OR  Informed Consent: I have reviewed the patients History and Physical, chart, labs and discussed the procedure including the risks, benefits and alternatives for the proposed anesthesia with the patient or authorized representative who has indicated his/her understanding and acceptance.   Dental advisory given  Plan Discussed with: Anesthesiologist and Surgeon  Anesthesia Plan Comments:         Anesthesia Quick Evaluation

## 2011-10-09 ENCOUNTER — Encounter (HOSPITAL_COMMUNITY): Payer: Self-pay | Admitting: General Surgery

## 2011-10-09 NOTE — Progress Notes (Signed)
Patient reports concern about pain and if she able to soak in warm water this soon after surgery with stiches requested that patient call Dr. Arita Miss office for concern. Patient given number to call.

## 2011-10-12 ENCOUNTER — Telehealth (INDEPENDENT_AMBULATORY_CARE_PROVIDER_SITE_OTHER): Payer: Self-pay | Admitting: General Surgery

## 2011-10-12 NOTE — Telephone Encounter (Signed)
Pt calling to verify her post op orders for using sitz bath.  She reports she has had BM at home and the packing from her wound came out at that time.  Her orders are for BID sitz baths with soapy water.  Suggested she use a small amount of baby shampoo,as it is very gentle.  Pt understands.

## 2011-10-15 ENCOUNTER — Encounter: Payer: Self-pay | Admitting: Pulmonary Disease

## 2011-10-21 ENCOUNTER — Telehealth (INDEPENDENT_AMBULATORY_CARE_PROVIDER_SITE_OTHER): Payer: Self-pay | Admitting: General Surgery

## 2011-10-21 NOTE — Telephone Encounter (Signed)
Called pt to inform her that Dr. Donell Beers needed to move her appt from 8/9 to 8/13 at 3:45.  The pt was fine with this.

## 2011-10-23 ENCOUNTER — Encounter (INDEPENDENT_AMBULATORY_CARE_PROVIDER_SITE_OTHER): Payer: Medicare Other | Admitting: General Surgery

## 2011-10-27 ENCOUNTER — Ambulatory Visit (INDEPENDENT_AMBULATORY_CARE_PROVIDER_SITE_OTHER): Payer: Medicare Other | Admitting: General Surgery

## 2011-10-27 ENCOUNTER — Encounter (INDEPENDENT_AMBULATORY_CARE_PROVIDER_SITE_OTHER): Payer: Self-pay | Admitting: General Surgery

## 2011-10-27 VITALS — BP 126/66 | HR 76 | Temp 97.4°F | Resp 16 | Ht 65.5 in | Wt 208.2 lb

## 2011-10-27 DIAGNOSIS — K602 Anal fissure, unspecified: Secondary | ICD-10-CM

## 2011-10-27 NOTE — Patient Instructions (Signed)
Avoid constipation 

## 2011-10-27 NOTE — Assessment & Plan Note (Signed)
Pain dramatically improved.  Healing appropriately.  Follow up as needed.  Pt advised that she may experience symptoms for another 4-6 weeks.    She was counseled to avoid constipation in order to avoid return of fissure.

## 2011-11-02 ENCOUNTER — Ambulatory Visit: Payer: Medicare Other | Admitting: Pulmonary Disease

## 2011-11-02 NOTE — Progress Notes (Signed)
HISTORY: The patient is now approximately 2-1/2-3 weeks status post exam under anesthesia, fissurectomy, and sphincterotomy, and anterior external hemorrhoidectomy.  She feels much better. She has decreased her pain medication significantly. She is still a little sore but is now able to sit on her bottom. She has been treating her constipation but has still been having some issues.    EXAM: General:  Alert and oriented Incision:  Healing.  Still some raw areas.     PATHOLOGY: Benign anal mucosa   ASSESSMENT AND PLAN:   Anal fissure Pain dramatically improved.  Healing appropriately.  Follow up as needed.  Pt advised that she may experience symptoms for another 4-6 weeks.    She was counseled to avoid constipation in order to avoid return of fissure.        Maudry Diego, MD Surgical Oncology, General & Endocrine Surgery [facility]  Rogelia Boga, MD Gordy Savers, MD

## 2011-11-24 ENCOUNTER — Ambulatory Visit (INDEPENDENT_AMBULATORY_CARE_PROVIDER_SITE_OTHER): Payer: Medicare Other | Admitting: Pulmonary Disease

## 2011-11-24 ENCOUNTER — Encounter: Payer: Self-pay | Admitting: Pulmonary Disease

## 2011-11-24 VITALS — BP 138/84 | HR 62 | Temp 98.4°F | Ht 65.5 in | Wt 216.8 lb

## 2011-11-24 DIAGNOSIS — G4733 Obstructive sleep apnea (adult) (pediatric): Secondary | ICD-10-CM

## 2011-11-24 NOTE — Patient Instructions (Addendum)
Will put your machine on automatic for the next few weeks to optimize your pressure.  Will let you know the results. Work on continued weight loss followup with me in one year if doing well.

## 2011-11-24 NOTE — Assessment & Plan Note (Signed)
The patient is now back on CPAP and feels that she is doing well with the device.  We still have to optimize her pressure, and we'll therefore used the automatic setting to accomplish this.  I've also encouraged her to continue working on weight loss. Care Plan:  At this point, will arrange for the patient's machine to be changed over to auto mode for 2 weeks to optimize their pressure.  I will review the downloaded data once sent by dme, and also evaluate for compliance, leaks, and residual osa.  I will call the patient and dme to discuss the results, and have the patient's machine set appropriately.  This will serve as the pt's cpap pressure titration.

## 2011-11-24 NOTE — Progress Notes (Signed)
  Subjective:    Patient ID: Miranda [name], female    DOB: [birth year], 66 y.o.   MRN: [id number]  HPI The patient comes in today for followup of her known obstructive sleep apnea.  She did not make her 6 week followup, and therefore her pressure has not been optimized.  She was unable to wear her CPAP compliantly during multiple hospitalizations for her, as well as death in family members.  She is now back on the device, and is having no issues with mask leaks or pressure.  She feels that she does sleep better with the device, with improved daytime alertness.   Review of Systems  Constitutional: Negative for fever and unexpected weight change.  HENT: Negative for ear pain, nosebleeds, congestion, sore throat, rhinorrhea, sneezing, trouble swallowing, dental problem, postnasal drip and sinus pressure.   Eyes: Negative for redness and itching.  Respiratory: Positive for shortness of breath. Negative for cough, chest tightness and wheezing.   Cardiovascular: Negative for palpitations and leg swelling.  Gastrointestinal: Negative for nausea and vomiting.  Genitourinary: Negative for dysuria.  Musculoskeletal: Negative for joint swelling.  Skin: Negative for rash.  Neurological: Positive for headaches.  Hematological: Bruises/bleeds easily.  Psychiatric/Behavioral: Positive for dysphoric mood. The patient is nervous/anxious.        Objective:   Physical Exam Overweight female in no acute distress No skin breakdown or pressure necrosis from the CPAP mask Lower extremities with mild edema, no cyanosis Alert and oriented, moves all 4 extremities.       Assessment & Plan:

## 2011-12-09 ENCOUNTER — Other Ambulatory Visit (HOSPITAL_COMMUNITY): Payer: Self-pay | Admitting: Internal Medicine

## 2011-12-11 ENCOUNTER — Encounter: Payer: Self-pay | Admitting: Pulmonary Disease

## 2011-12-11 ENCOUNTER — Other Ambulatory Visit: Payer: Self-pay | Admitting: Pulmonary Disease

## 2011-12-11 DIAGNOSIS — G4733 Obstructive sleep apnea (adult) (pediatric): Secondary | ICD-10-CM

## 2011-12-19 ENCOUNTER — Other Ambulatory Visit: Payer: Self-pay | Admitting: Internal Medicine

## 2012-01-14 ENCOUNTER — Other Ambulatory Visit: Payer: Self-pay | Admitting: Internal Medicine

## 2012-01-14 MED ORDER — HYDROCODONE-ACETAMINOPHEN 7.5-500 MG PO TABS: 1.0000 | ORAL_TABLET | ORAL | Status: DC | PRN

## 2012-01-14 NOTE — Telephone Encounter (Signed)
Ok to RF? 

## 2012-01-14 NOTE — Telephone Encounter (Signed)
Last seen 08/27/11 dysuria Last written 09/17/11  # [phone] Rf Please advise

## 2012-01-14 NOTE — Telephone Encounter (Signed)
Patient is requesting a refill of Hydrocodone 7.5. Please advise

## 2012-05-09 ENCOUNTER — Ambulatory Visit: Payer: Medicare Other | Admitting: Internal Medicine

## 2012-05-16 ENCOUNTER — Ambulatory Visit (INDEPENDENT_AMBULATORY_CARE_PROVIDER_SITE_OTHER): Payer: Self-pay | Admitting: Internal Medicine

## 2012-05-16 ENCOUNTER — Encounter: Payer: Self-pay | Admitting: Internal Medicine

## 2012-05-16 VITALS — BP 140/80 | HR 91 | Temp 98.3°F | Resp 20 | Wt 222.0 lb

## 2012-05-16 DIAGNOSIS — M545 Low back pain, unspecified: Secondary | ICD-10-CM

## 2012-05-16 DIAGNOSIS — M25519 Pain in unspecified shoulder: Secondary | ICD-10-CM

## 2012-05-16 DIAGNOSIS — I10 Essential (primary) hypertension: Secondary | ICD-10-CM

## 2012-05-16 DIAGNOSIS — M25511 Pain in right shoulder: Secondary | ICD-10-CM

## 2012-05-16 MED ORDER — ALPRAZOLAM 0.25 MG PO TABS: 0.2500 mg | ORAL_TABLET | Freq: Three times a day (TID) | ORAL | Status: DC | PRN

## 2012-05-16 MED ORDER — BENAZEPRIL-HYDROCHLOROTHIAZIDE 20-12.5 MG PO TABS: 2.0000 | ORAL_TABLET | Freq: Every day | ORAL | Status: DC

## 2012-05-16 MED ORDER — AMLODIPINE BESYLATE 5 MG PO TABS: 5.0000 mg | ORAL_TABLET | Freq: Every day | ORAL | Status: DC

## 2012-05-16 MED ORDER — FUROSEMIDE 40 MG PO TABS: 40.0000 mg | ORAL_TABLET | Freq: Every day | ORAL | Status: DC

## 2012-05-16 MED ORDER — METHYLPREDNISOLONE ACETATE 80 MG/ML IJ SUSP
80.0000 mg | Freq: Once | INTRAMUSCULAR | Status: AC
  Administered 2012-05-16: 80 mg via INTRAMUSCULAR

## 2012-05-16 MED ORDER — METOPROLOL TARTRATE 50 MG PO TABS: 50.0000 mg | ORAL_TABLET | Freq: Two times a day (BID) | ORAL | Status: DC

## 2012-05-16 MED ORDER — CYCLOBENZAPRINE HCL 10 MG PO TABS: ORAL_TABLET | ORAL | Status: DC

## 2012-05-16 MED ORDER — LOVASTATIN 40 MG PO TABS: 40.0000 mg | ORAL_TABLET | Freq: Every day | ORAL | Status: DC

## 2012-05-16 MED ORDER — HYDROCODONE-ACETAMINOPHEN 7.5-325 MG PO TABS: 1.0000 | ORAL_TABLET | Freq: Four times a day (QID) | ORAL | Status: DC | PRN

## 2012-05-16 NOTE — Patient Instructions (Signed)
Limit your sodium (Salt) intake  Please check your blood pressure on a regular basis.  If it is consistently greater than 150/90, please make an office appointment.  Call for orthopedic referral if right arm does not improveRotator Cuff Tendonitis  The rotator cuff is the collection of all the muscles and tendons (the supraspinatus, infraspinatus, subscapularis, and teres minor muscles and their tendons) that help your shoulder stay in place. This unit holds the head of the upper arm bone (humerus) in the cup (fossa) of the shoulder blade (scapula). Basically, it connects the arm to the shoulder. Tendinitis is a swelling and irritation of the tissue, called cord like structures (tendons) that connect muscle to bone. It usually is caused by overusing the joint involved. When the tissue surrounding a tendon (the synovium) becomes inflamed, it is called tenosynovitis. This also is often the result of overuse in people whose jobs require repetitive (over and over again) types of motion. HOME CARE INSTRUCTIONS   Use a sling or splint for as long as directed by your caregiver until the pain decreases.  Apply ice to the injury for 15 to 20 minutes, 3 to 4 times per day. Put the ice in a plastic bag and place a towel between the bag of ice and your skin.  Try to avoid use other than gentle range of motion while your shoulder is painful. Use and exercise only as directed by your caregiver. Stop exercises or range of motion if pain or discomfort increases, unless directed otherwise by your caregiver.  Only take over-the-counter or prescription medicines for pain, discomfort, or fever as directed by your caregiver.  If you were give a shoulder sling and straps (immobilizer), do not remove it except as directed, or until you see a caregiver for a follow-up examination. If you need to remove it, move your arm as little as possible or as directed.  You may want to sleep on several pillows at night to lessen  swelling and pain. SEEK IMMEDIATE MEDICAL CARE IF:   Pain in your shoulder increases or new pain develops in your arm, hand, or fingers and is not relieved with medications.  You develop new, unexplained symptoms, especially increased numbness in the hands or loss of strength, or you develop any worsening of the problems which brought you in for care.  Your arm, hand, or fingers are numb or tingling.  Your arm, hand, or fingers are swollen, painful, or turn white or blue. Document Released: 05/23/2003 Document Revised: 05/25/2011 Document Reviewed: 12/29/2007 [facility] Patient Information 2013 [place], [place].

## 2012-05-16 NOTE — Progress Notes (Signed)
Subjective:    Patient ID: Miranda [name], female    DOB: 1945/09/05, 67 y.o.   MRN: [id number]  HPI  67 year old patient who presents with a 2 to three-week history of right shoulder pain. No obvious major trauma. She did slip on ice but did not fall and seemed to aggravate her right shoulder pain at that time. Right shoulder pain the was already present before the near fall however. She does have osteoarthritis and chronic low back pain. Medication list includes hydrocodone.. She states she needs a refill She has treated hypertension  Past Medical History  Diagnosis Date  . GERD (gastroesophageal reflux disease)   . Hyperlipidemia   . Low back pain   . Asthma   . Colon polyps   . Hemorrhoids   . Complication of anesthesia     states requires a lot med. to put her to sleep   . Family history of anesthesia complication   . CHF (congestive heart failure)     pt. unsure- but thinks she was hosp. for CHF- 2002  . Chronic kidney disease     [facility]  . Anxiety   . Depression     "sometimes "  . Shortness of breath   . Sleep apnea     uses c-pap- q night recently  . DDD (degenerative disc disease) 09/17/2011  . Glaucoma     bilateral, pt. admits that she is noncompliant to eye gtts.   . Hypertension     had stress, echo- 2006 /w [facility], Cardiac Cath, per pt. 2002, echo repeated 2012- wnl     History   Social History  . Marital Status: Married    Spouse Name: N/A    Number of Children: N/A  . Years of Education: N/A   Occupational History  . retired Lawyer    Social History Main Topics  . Smoking status: Never Smoker   . Smokeless tobacco: Never Used  . Alcohol Use: No  . Drug Use: No  . Sexually Active: No   Other Topics Concern  . Not on file   Social History Narrative  . No narrative on file    Past Surgical History  Procedure Laterality Date  . Cardiac catheterization    . Colonoscopy      remote  . Abdominal hysterectomy      ectopic,  fibroids  . Foot surgery      bilat, heel spurs- screw in R foot   . Flexible sigmoidoscopy  02/25/2011    Procedure: FLEXIBLE SIGMOIDOSCOPY;  Surgeon: Louis Meckel, MD;  Location: [facility];  Service: Endoscopy;  Laterality: N/A;  . Eye surgery      cataracts removed bilateral- ?IOL  . Sphincterotomy  10/08/2011    Procedure: SPHINCTEROTOMY;  Surgeon: Almond Lint, MD;  Location: [facility] OR;  Service: General;  Laterality: N/A;  . Fissurectomy  10/08/2011    Procedure: FISSURECTOMY;  Surgeon: Almond Lint, MD;  Location: [facility] OR;  Service: General;  Laterality: N/A;  . Hemorrhoid surgery  10/08/2011    Procedure: HEMORRHOIDECTOMY;  Surgeon: Almond Lint, MD;  Location: [facility] OR;  Service: General;  Laterality: N/A;  External     Family History  Problem Relation Age of Onset  . Cancer Mother     breast  . Heart attack Mother   . Heart disease Mother   . Cancer Other     breast, colon, lung  . Heart disease Other   . Anesthesia problems Neg Hx   .  Emphysema Sister   . Cancer Sister     breast  . Asthma Sister   . Heart disease Sister   . Rheum arthritis Sister   . Cancer Brother     colon    Allergies  Allergen Reactions  . Fish Oil Anaphylaxis    REACTION: throat swelling  . Aspirin     REACTION: rash  . Ciprofloxacin Hives    Rash/hives/itch  . Influenza Vaccines Hives  . Ivp Dye (Iodinated Diagnostic Agents)   . Macrobid (Nitrofurantoin Monohydrate Macrocrystals) Hives  . Penicillins     REACTION: swelling  . Shellfish Allergy Hives    Patient also allergic to seafood  . Bactrim (Sulfamethoxazole W-Trimethoprim) Rash  . Ibuprofen Rash    REACTION: rash  . Latex Rash    Current Outpatient Prescriptions on File Prior to Visit  Medication Sig Dispense Refill  . ALPRAZolam (XANAX) 0.25 MG tablet       . amLODipine (NORVASC) 5 MG tablet Take 5 mg by mouth daily. Pt. Reports noncompliance to this med.      . benazepril-hydrochlorthiazide (LOTENSIN HCT) 20-12.5 MG per  tablet Take 2 tablets by mouth daily with breakfast.       . bisacodyl (DULCOLAX) 5 MG EC tablet Take 20 mg by mouth daily as needed. For severe constipation.      . cyclobenzaprine (FLEXERIL) 10 MG tablet TAKE ONE TABLET BY MOUTH THREE TIMES DAILY AS NEEDED  90 tablet  0  . Fluticasone-Salmeterol (ADVAIR) 250-50 MCG/DOSE AEPB Inhale 1 puff into the lungs every 12 (twelve) hours.  60 each  6  . furosemide (LASIX) 40 MG tablet Take 40 mg by mouth daily.      Marland Kitchen HYDROcodone-acetaminophen (LORTAB) 7.5-500 MG per tablet Take 1 tablet by mouth every 4 (four) hours as needed for pain.  30 tablet  0  . lovastatin (MEVACOR) 40 MG tablet Take 40 mg by mouth at bedtime.      . metoprolol (LOPRESSOR) 50 MG tablet Take 50 mg by mouth 2 (two) times daily.      . simethicone (MYLICON) 80 MG chewable tablet Chew 160 mg by mouth every 6 (six) hours as needed. For gas.      . albuterol (PROVENTIL HFA;VENTOLIN HFA) 108 (90 BASE) MCG/ACT inhaler Inhale 2 puffs into the lungs every 6 (six) hours as needed.      Marland Kitchen omeprazole (PRILOSEC) 20 MG capsule Take 2 capsules (40 mg total) by mouth daily.  30 capsule  6   No current facility-administered medications on file prior to visit.    BP 140/80  Pulse 91  Temp(Src) 98.3 F (36.8 C) (Oral)  Resp 20  Wt 222 lb (100.699 kg)  BMI 36.37 kg/m2  SpO2 93%       Review of Systems  Constitutional: Negative.   HENT: Negative for hearing loss, congestion, sore throat, rhinorrhea, dental problem, sinus pressure and tinnitus.   Eyes: Negative for pain, discharge and visual disturbance.  Respiratory: Negative for cough and shortness of breath.   Cardiovascular: Negative for chest pain, palpitations and leg swelling.  Gastrointestinal: Negative for nausea, vomiting, abdominal pain, diarrhea, constipation, blood in stool and abdominal distention.  Genitourinary: Negative for dysuria, urgency, frequency, hematuria, flank pain, vaginal bleeding, vaginal discharge,  difficulty urinating, vaginal pain and pelvic pain.  Musculoskeletal: Negative for joint swelling, arthralgias and gait problem.       Right shoulder pain  Skin: Negative for rash.  Neurological: Negative for dizziness, syncope, speech difficulty,  weakness, numbness and headaches.  Hematological: Negative for adenopathy.  Psychiatric/Behavioral: Negative for behavioral problems, dysphoric mood and agitation. The patient is not nervous/anxious.        Objective:   Physical Exam  Constitutional:  Blood pressure 130/80  Musculoskeletal:  Positive painful arc test-patient able to raise her arm to 90 only without pain  Unable to perform internal rotation leg test due 2 pain and decreased range of motion  Composite test positive  External rotation leg test also positive          Assessment & Plan:    Right shoulder pain. Probable rotator cuff pathology. Will treat with Depo-Medrol 80 to the right deltoid. Will refill analgesics and observe If unimproved we'll set up for orthopedic referral Hypertension stable

## 2012-06-22 ENCOUNTER — Other Ambulatory Visit: Payer: Self-pay | Admitting: Internal Medicine

## 2012-07-19 ENCOUNTER — Ambulatory Visit (INDEPENDENT_AMBULATORY_CARE_PROVIDER_SITE_OTHER): Payer: Medicare HMO | Admitting: Internal Medicine

## 2012-07-19 ENCOUNTER — Encounter: Payer: Self-pay | Admitting: Internal Medicine

## 2012-07-19 VITALS — BP 160/90 | HR 70 | Temp 98.7°F | Resp 20 | Wt 221.0 lb

## 2012-07-19 DIAGNOSIS — I10 Essential (primary) hypertension: Secondary | ICD-10-CM

## 2012-07-19 DIAGNOSIS — M545 Low back pain, unspecified: Secondary | ICD-10-CM

## 2012-07-19 DIAGNOSIS — G4700 Insomnia, unspecified: Secondary | ICD-10-CM

## 2012-07-19 MED ORDER — LACTULOSE 10 GM/15ML PO SOLN: 20.0000 g | Freq: Three times a day (TID) | ORAL | Status: DC

## 2012-07-19 MED ORDER — OMEPRAZOLE 20 MG PO CPDR: 40.0000 mg | DELAYED_RELEASE_CAPSULE | Freq: Every day | ORAL | Status: DC

## 2012-07-19 MED ORDER — ALBUTEROL SULFATE HFA 108 (90 BASE) MCG/ACT IN AERS: 2.0000 | INHALATION_SPRAY | Freq: Four times a day (QID) | RESPIRATORY_TRACT | Status: DC | PRN

## 2012-07-19 MED ORDER — DIBUCAINE 1 % EX OINT: TOPICAL_OINTMENT | Freq: Three times a day (TID) | CUTANEOUS | Status: DC | PRN

## 2012-07-19 MED ORDER — MELOXICAM 7.5 MG PO TABS: 7.5000 mg | ORAL_TABLET | Freq: Four times a day (QID) | ORAL | Status: DC | PRN

## 2012-07-19 NOTE — Patient Instructions (Signed)
Limit your sodium (Salt) intake  Please check your blood pressure on a regular basis.  If it is consistently greater than 150/90, please make an office appointment.  Return in 3 months for follow-up Impingement Syndrome, Rotator Cuff, Bursitis with Rehab Impingement syndrome is a condition that involves inflammation of the tendons of the rotator cuff and the subacromial bursa, that causes pain in the shoulder. The rotator cuff consists of four tendons and muscles that control much of the shoulder and upper arm function. The subacromial bursa is a fluid filled sac that helps reduce friction between the rotator cuff and one of the bones of the shoulder (acromion). Impingement syndrome is usually an overuse injury that causes swelling of the bursa (bursitis), swelling of the tendon (tendonitis), and/or a tear of the tendon (strain). Strains are classified into three categories. Grade 1 strains cause pain, but the tendon is not lengthened. Grade 2 strains include a lengthened ligament, due to the ligament being stretched or partially ruptured. With grade 2 strains there is still function, although the function may be decreased. Grade 3 strains include a complete tear of the tendon or muscle, and function is usually impaired. SYMPTOMS   Pain around the shoulder, often at the outer portion of the upper arm.  Pain that gets worse with shoulder function, especially when reaching overhead or lifting.  Sometimes, aching when not using the arm.  Pain that wakes you up at night.  Sometimes, tenderness, swelling, warmth, or redness over the affected area.  Loss of strength.  Limited motion of the shoulder, especially reaching behind the back (to the back pocket or to unhook bra) or across your body.  Crackling sound (crepitation) when moving the arm.  Biceps tendon pain and inflammation (in the front of the shoulder). Worse when bending the elbow or lifting. CAUSES  Impingement syndrome is often an  overuse injury, in which chronic (repetitive) motions cause the tendons or bursa to become inflamed. A strain occurs when a force is paced on the tendon or muscle that is greater than it can withstand. Common mechanisms of injury include: Stress from sudden increase in duration, frequency, or intensity of training.  Direct hit (trauma) to the shoulder.  Aging, erosion of the tendon with normal use.  Bony bump on shoulder (acromial spur). RISK INCREASES WITH:  Contact sports (football, wrestling, boxing).  Throwing sports (baseball, tennis, volleyball).  Weightlifting and bodybuilding.  Heavy labor.  Previous injury to the rotator cuff, including impingement.  Poor shoulder strength and flexibility.  Failure to warm up properly before activity.  Inadequate protective equipment.  Old age.  Bony bump on shoulder (acromial spur). PREVENTION   Warm up and stretch properly before activity.  Allow for adequate recovery between workouts.  Maintain physical fitness:  Strength, flexibility, and endurance.  Cardiovascular fitness.  Learn and use proper exercise technique. PROGNOSIS  If treated properly, impingement syndrome usually goes away within 6 weeks. Sometimes surgery is required.  RELATED COMPLICATIONS   Longer healing time if not properly treated, or if not given enough time to heal.  Recurring symptoms, that result in a chronic condition.  Shoulder stiffness, frozen shoulder, or loss of motion.  Rotator cuff tendon tear.  Recurring symptoms, especially if activity is resumed too soon, with overuse, with a direct blow, or when using poor technique. TREATMENT  Treatment first involves the use of ice and medicine, to reduce pain and inflammation. The use of strengthening and stretching exercises may help reduce pain with activity. These  exercises may be performed at home or with a therapist. If non-surgical treatment is unsuccessful after more than 6 months,  surgery may be advised. After surgery and rehabilitation, activity is usually possible in 3 months.  MEDICATION  If pain medicine is needed, nonsteroidal anti-inflammatory medicines (aspirin and ibuprofen), or other minor pain relievers (acetaminophen), are often advised.  Do not take pain medicine for 7 days before surgery.  Prescription pain relievers may be given, if your caregiver thinks they are needed. Use only as directed and only as much as you need.  Corticosteroid injections may be given by your caregiver. These injections should be reserved for the most serious cases, because they may only be given a certain number of times. HEAT AND COLD  Cold treatment (icing) should be applied for 10 to 15 minutes every 2 to 3 hours for inflammation and pain, and immediately after activity that aggravates your symptoms. Use ice packs or an ice massage.  Heat treatment may be used before performing stretching and strengthening activities prescribed by your caregiver, physical therapist, or athletic trainer. Use a heat pack or a warm water soak. SEEK MEDICAL CARE IF:   Symptoms get worse or do not improve in 4 to 6 weeks, despite treatment.  New, unexplained symptoms develop. (Drugs used in treatment may produce side effects.) EXERCISES  RANGE OF MOTION (ROM) AND STRETCHING EXERCISES - Impingement Syndrome (Rotator Cuff  Tendinitis, Bursitis) These exercises may help you when beginning to rehabilitate your injury. Your symptoms may go away with or without further involvement from your physician, physical therapist or athletic trainer. While completing these exercises, remember:   Restoring tissue flexibility helps normal motion to return to the joints. This allows healthier, less painful movement and activity.  An effective stretch should be held for at least 30 seconds.  A stretch should never be painful. You should only feel a gentle lengthening or release in the stretched tissue. STRETCH   Flexion, Standing  Stand with good posture. With an underhand grip on your right / left hand, and an overhand grip on the opposite hand, grasp a broomstick or cane so that your hands are a little more than shoulder width apart.  Keeping your right / left elbow straight and shoulder muscles relaxed, push the stick with your opposite hand, to raise your right / left arm in front of your body and then overhead. Raise your arm until you feel a stretch in your right / left shoulder, but before you have increased shoulder pain.  Try to avoid shrugging your right / left shoulder as your arm rises, by keeping your shoulder blade tucked down and toward your mid-back spine. Hold for __________ seconds.  Slowly return to the starting position. Repeat __________ times. Complete this exercise __________ times per day. STRETCH  Abduction, Supine  Lie on your back. With an underhand grip on your right / left hand and an overhand grip on the opposite hand, grasp a broomstick or cane so that your hands are a little more than shoulder width apart.  Keeping your right / left elbow straight and your shoulder muscles relaxed, push the stick with your opposite hand, to raise your right / left arm out to the side of your body and then overhead. Raise your arm until you feel a stretch in your right / left shoulder, but before you have increased shoulder pain.  Try to avoid shrugging your right / left shoulder as your arm rises, by keeping your shoulder blade tucked  down and toward your mid-back spine. Hold for __________ seconds.  Slowly return to the starting position. Repeat __________ times. Complete this exercise __________ times per day. ROM  Flexion, Active-Assisted  Lie on your back. You may bend your knees for comfort.  Grasp a broomstick or cane so your hands are about shoulder width apart. Your right / left hand should grip the end of the stick, so that your hand is positioned "thumbs-up," as if you were  about to shake hands.  Using your healthy arm to lead, raise your right / left arm overhead, until you feel a gentle stretch in your shoulder. Hold for __________ seconds.  Use the stick to assist in returning your right / left arm to its starting position. Repeat __________ times. Complete this exercise __________ times per day.  ROM - Internal Rotation, Supine   Lie on your back on a firm surface. Place your right / left elbow about 60 degrees away from your side. Elevate your elbow with a folded towel, so that the elbow and shoulder are the same height.  Using a broomstick or cane and your strong arm, pull your right / left hand toward your body until you feel a gentle stretch, but no increase in your shoulder pain. Keep your shoulder and elbow in place throughout the exercise.  Hold for __________ seconds. Slowly return to the starting position. Repeat __________ times. Complete this exercise __________ times per day. STRETCH - Internal Rotation  Place your right / left hand behind your back, palm up.  Throw a towel or belt over your opposite shoulder. Grasp the towel with your right / left hand.  While keeping an upright posture, gently pull up on the towel, until you feel a stretch in the front of your right / left shoulder.  Avoid shrugging your right / left shoulder as your arm rises, by keeping your shoulder blade tucked down and toward your mid-back spine.  Hold for __________ seconds. Release the stretch, by lowering your healthy hand. Repeat __________ times. Complete this exercise __________ times per day. ROM - Internal Rotation   Using an underhand grip, grasp a stick behind your back with both hands.  While standing upright with good posture, slide the stick up your back until you feel a mild stretch in the front of your shoulder.  Hold for __________ seconds. Slowly return to your starting position. Repeat __________ times. Complete this exercise __________ times per  day.  STRETCH  Posterior Shoulder Capsule   Stand or sit with good posture. Grasp your right / left elbow and draw it across your chest, keeping it at the same height as your shoulder.  Pull your elbow, so your upper arm comes in closer to your chest. Pull until you feel a gentle stretch in the back of your shoulder.  Hold for __________ seconds. Repeat __________ times. Complete this exercise __________ times per day. STRENGTHENING EXERCISES - Impingement Syndrome (Rotator Cuff Tendinitis, Bursitis) These exercises may help you when beginning to rehabilitate your injury. They may resolve your symptoms with or without further involvement from your physician, physical therapist or athletic trainer. While completing these exercises, remember:  Muscles can gain both the endurance and the strength needed for everyday activities through controlled exercises.  Complete these exercises as instructed by your physician, physical therapist or athletic trainer. Increase the resistance and repetitions only as guided.  You may experience muscle soreness or fatigue, but the pain or discomfort you are trying to eliminate should  never worsen during these exercises. If this pain does get worse, stop and make sure you are following the directions exactly. If the pain is still present after adjustments, discontinue the exercise until you can discuss the trouble with your clinician.  During your recovery, avoid activity or exercises which involve actions that place your injured hand or elbow above your head or behind your back or head. These positions stress the tissues which you are trying to heal. STRENGTH - Scapular Depression and Adduction   With good posture, sit on a firm chair. Support your arms in front of you, with pillows, arm rests, or on a table top. Have your elbows in line with the sides of your body.  Gently draw your shoulder blades down and toward your mid-back spine. Gradually increase the  tension, without tensing the muscles along the top of your shoulders and the back of your neck.  Hold for __________ seconds. Slowly release the tension and relax your muscles completely before starting the next repetition.  After you have practiced this exercise, remove the arm support and complete the exercise in standing as well as sitting position. Repeat __________ times. Complete this exercise __________ times per day.  STRENGTH - Shoulder Abductors, Isometric  With good posture, stand or sit about 4-6 inches from a wall, with your right / left side facing the wall.  Bend your right / left elbow. Gently press your right / left elbow into the wall. Increase the pressure gradually, until you are pressing as hard as you can, without shrugging your shoulder or increasing any shoulder discomfort.  Hold for __________ seconds.  Release the tension slowly. Relax your shoulder muscles completely before you begin the next repetition. Repeat __________ times. Complete this exercise __________ times per day.  STRENGTH - External Rotators, Isometric  Keep your right / left elbow at your side and bend it 90 degrees.  Step into a door frame so that the outside of your right / left wrist can press against the door frame without your upper arm leaving your side.  Gently press your right / left wrist into the door frame, as if you were trying to swing the back of your hand away from your stomach. Gradually increase the tension, until you are pressing as hard as you can, without shrugging your shoulder or increasing any shoulder discomfort.  Hold for __________ seconds.  Release the tension slowly. Relax your shoulder muscles completely before you begin the next repetition. Repeat __________ times. Complete this exercise __________ times per day.  STRENGTH - Supraspinatus   Stand or sit with good posture. Grasp a __________ weight, or an exercise band or tubing, so that your hand is "thumbs-up,"  like you are shaking hands.  Slowly lift your right / left arm in a "V" away from your thigh, diagonally into the space between your side and straight ahead. Lift your hand to shoulder height or as far as you can, without increasing any shoulder pain. At first, many people do not lift their hands above shoulder height.  Avoid shrugging your right / left shoulder as your arm rises, by keeping your shoulder blade tucked down and toward your mid-back spine.  Hold for __________ seconds. Control the descent of your hand, as you slowly return to your starting position. Repeat __________ times. Complete this exercise __________ times per day.  STRENGTH - External Rotators  Secure a rubber exercise band or tubing to a fixed object (table, pole) so that it is at  the same height as your right / left elbow when you are standing or sitting on a firm surface.  Stand or sit so that the secured exercise band is at your uninjured side.  Bend your right / left elbow 90 degrees. Place a folded towel or small pillow under your right / left arm, so that your elbow is a few inches away from your side.  Keeping the tension on the exercise band, pull it away from your body, as if pivoting on your elbow. Be sure to keep your body steady, so that the movement is coming only from your rotating shoulder.  Hold for __________ seconds. Release the tension in a controlled manner, as you return to the starting position. Repeat __________ times. Complete this exercise __________ times per day.  STRENGTH - Internal Rotators   Secure a rubber exercise band or tubing to a fixed object (table, pole) so that it is at the same height as your right / left elbow when you are standing or sitting on a firm surface.  Stand or sit so that the secured exercise band is at your right / left side.  Bend your elbow 90 degrees. Place a folded towel or small pillow under your right / left arm so that your elbow is a few inches away from  your side.  Keeping the tension on the exercise band, pull it across your body, toward your stomach. Be sure to keep your body steady, so that the movement is coming only from your rotating shoulder.  Hold for __________ seconds. Release the tension in a controlled manner, as you return to the starting position. Repeat __________ times. Complete this exercise __________ times per day.  STRENGTH - Scapular Protractors, Standing   Stand arms length away from a wall. Place your hands on the wall, keeping your elbows straight.  Begin by dropping your shoulder blades down and toward your mid-back spine.  To strengthen your protractors, keep your shoulder blades down, but slide them forward on your rib cage. It will feel as if you are lifting the back of your rib cage away from the wall. This is a subtle motion and can be challenging to complete. Ask your caregiver for further instruction, if you are not sure you are doing the exercise correctly.  Hold for __________ seconds. Slowly return to the starting position, resting the muscles completely before starting the next repetition. Repeat __________ times. Complete this exercise __________ times per day. STRENGTH - Scapular Protractors, Supine  Lie on your back on a firm surface. Extend your right / left arm straight into the air while holding a __________ weight in your hand.  Keeping your head and back in place, lift your shoulder off the floor.  Hold for __________ seconds. Slowly return to the starting position, and allow your muscles to relax completely before starting the next repetition. Repeat __________ times. Complete this exercise __________ times per day. STRENGTH - Scapular Protractors, Quadruped  Get onto your hands and knees, with your shoulders directly over your hands (or as close as you can be, comfortably).  Keeping your elbows locked, lift the back of your rib cage up into your shoulder blades, so your mid-back rounds out.  Keep your neck muscles relaxed.  Hold this position for __________ seconds. Slowly return to the starting position and allow your muscles to relax completely before starting the next repetition. Repeat __________ times. Complete this exercise __________ times per day.  STRENGTH - Scapular Retractors  Secure a  rubber exercise band or tubing to a fixed object (table, pole), so that it is at the height of your shoulders when you are either standing, or sitting on a firm armless chair.  With a palm down grip, grasp an end of the band in each hand. Straighten your elbows and lift your hands straight in front of you, at shoulder height. Step back, away from the secured end of the band, until it becomes tense.  Squeezing your shoulder blades together, draw your elbows back toward your sides, as you bend them. Keep your upper arms lifted away from your body throughout the exercise.  Hold for __________ seconds. Slowly ease the tension on the band, as you reverse the directions and return to the starting position. Repeat __________ times. Complete this exercise __________ times per day. STRENGTH - Shoulder Extensors   Secure a rubber exercise band or tubing to a fixed object (table, pole) so that it is at the height of your shoulders when you are either standing, or sitting on a firm armless chair.  With a thumbs-up grip, grasp an end of the band in each hand. Straighten your elbows and lift your hands straight in front of you, at shoulder height. Step back, away from the secured end of the band, until it becomes tense.  Squeezing your shoulder blades together, pull your hands down to the sides of your thighs. Do not allow your hands to go behind you.  Hold for __________ seconds. Slowly ease the tension on the band, as you reverse the directions and return to the starting position. Repeat __________ times. Complete this exercise __________ times per day.  STRENGTH - Scapular Retractors and External  Rotators   Secure a rubber exercise band or tubing to a fixed object (table, pole) so that it is at the height as your shoulders, when you are either standing, or sitting on a firm armless chair.  With a palm down grip, grasp an end of the band in each hand. Bend your elbows 90 degrees and lift your elbows to shoulder height, at your sides. Step back, away from the secured end of the band, until it becomes tense.  Squeezing your shoulder blades together, rotate your shoulders so that your upper arms and elbows remain stationary, but your fists travel upward to head height.  Hold for __________ seconds. Slowly ease the tension on the band, as you reverse the directions and return to the starting position. Repeat __________ times. Complete this exercise __________ times per day.  STRENGTH - Scapular Retractors and External Rotators, Rowing   Secure a rubber exercise band or tubing to a fixed object (table, pole) so that it is at the height of your shoulders, when you are either standing, or sitting on a firm armless chair.  With a palm down grip, grasp an end of the band in each hand. Straighten your elbows and lift your hands straight in front of you, at shoulder height. Step back, away from the secured end of the band, until it becomes tense.  Step 1: Squeeze your shoulder blades together. Bending your elbows, draw your hands to your chest, as if you are rowing a boat. At the end of this motion, your hands and elbow should be at shoulder height and your elbows should be out to your sides.  Step 2: Rotate your shoulders, to raise your hands above your head. Your forearms should be vertical and your upper arms should be horizontal.  Hold for __________ seconds. Slowly ease  the tension on the band, as you reverse the directions and return to the starting position. Repeat __________ times. Complete this exercise __________ times per day.  STRENGTH  Scapular Depressors  Find a sturdy chair without  wheels, such as a dining room chair.  Keeping your feet on the floor, and your hands on the chair arms, lift your bottom up from the seat, and lock your elbows.  Keeping your elbows straight, allow gravity to pull your body weight down. Your shoulders will rise toward your ears.  Raise your body against gravity by drawing your shoulder blades down your back, shortening the distance between your shoulders and ears. Although your feet should always maintain contact with the floor, your feet should progressively support less body weight, as you get stronger.  Hold for __________ seconds. In a controlled and slow manner, lower your body weight to begin the next repetition. Repeat __________ times. Complete this exercise __________ times per day.  Document Released: 03/02/2005 Document Revised: 05/25/2011 Document Reviewed: 06/14/2008 [facility] Patient Information 2013 [place], [place]. Impingement Syndrome, Rotator Cuff, Bursitis with Rehab Impingement syndrome is a condition that involves inflammation of the tendons of the rotator cuff and the subacromial bursa, that causes pain in the shoulder. The rotator cuff consists of four tendons and muscles that control much of the shoulder and upper arm function. The subacromial bursa is a fluid filled sac that helps reduce friction between the rotator cuff and one of the bones of the shoulder (acromion). Impingement syndrome is usually an overuse injury that causes swelling of the bursa (bursitis), swelling of the tendon (tendonitis), and/or a tear of the tendon (strain). Strains are classified into three categories. Grade 1 strains cause pain, but the tendon is not lengthened. Grade 2 strains include a lengthened ligament, due to the ligament being stretched or partially ruptured. With grade 2 strains there is still function, although the function may be decreased. Grade 3 strains include a complete tear of the tendon or muscle, and function is usually  impaired. SYMPTOMS   Pain around the shoulder, often at the outer portion of the upper arm.  Pain that gets worse with shoulder function, especially when reaching overhead or lifting.  Sometimes, aching when not using the arm.  Pain that wakes you up at night.  Sometimes, tenderness, swelling, warmth, or redness over the affected area.  Loss of strength.  Limited motion of the shoulder, especially reaching behind the back (to the back pocket or to unhook bra) or across your body.  Crackling sound (crepitation) when moving the arm.  Biceps tendon pain and inflammation (in the front of the shoulder). Worse when bending the elbow or lifting. CAUSES  Impingement syndrome is often an overuse injury, in which chronic (repetitive) motions cause the tendons or bursa to become inflamed. A strain occurs when a force is paced on the tendon or muscle that is greater than it can withstand. Common mechanisms of injury include: Stress from sudden increase in duration, frequency, or intensity of training.  Direct hit (trauma) to the shoulder.  Aging, erosion of the tendon with normal use.  Bony bump on shoulder (acromial spur). RISK INCREASES WITH:  Contact sports (football, wrestling, boxing).  Throwing sports (baseball, tennis, volleyball).  Weightlifting and bodybuilding.  Heavy labor.  Previous injury to the rotator cuff, including impingement.  Poor shoulder strength and flexibility.  Failure to warm up properly before activity.  Inadequate protective equipment.  Old age.  Bony bump on shoulder (acromial spur). PREVENTION  Warm up and stretch properly before activity.  Allow for adequate recovery between workouts.  Maintain physical fitness:  Strength, flexibility, and endurance.  Cardiovascular fitness.  Learn and use proper exercise technique. PROGNOSIS  If treated properly, impingement syndrome usually goes away within 6 weeks. Sometimes surgery is required.   RELATED COMPLICATIONS   Longer healing time if not properly treated, or if not given enough time to heal.  Recurring symptoms, that result in a chronic condition.  Shoulder stiffness, frozen shoulder, or loss of motion.  Rotator cuff tendon tear.  Recurring symptoms, especially if activity is resumed too soon, with overuse, with a direct blow, or when using poor technique. TREATMENT  Treatment first involves the use of ice and medicine, to reduce pain and inflammation. The use of strengthening and stretching exercises may help reduce pain with activity. These exercises may be performed at home or with a therapist. If non-surgical treatment is unsuccessful after more than 6 months, surgery may be advised. After surgery and rehabilitation, activity is usually possible in 3 months.  MEDICATION  If pain medicine is needed, nonsteroidal anti-inflammatory medicines (aspirin and ibuprofen), or other minor pain relievers (acetaminophen), are often advised.  Do not take pain medicine for 7 days before surgery.  Prescription pain relievers may be given, if your caregiver thinks they are needed. Use only as directed and only as much as you need.  Corticosteroid injections may be given by your caregiver. These injections should be reserved for the most serious cases, because they may only be given a certain number of times. HEAT AND COLD  Cold treatment (icing) should be applied for 10 to 15 minutes every 2 to 3 hours for inflammation and pain, and immediately after activity that aggravates your symptoms. Use ice packs or an ice massage.  Heat treatment may be used before performing stretching and strengthening activities prescribed by your caregiver, physical therapist, or athletic trainer. Use a heat pack or a warm water soak. SEEK MEDICAL CARE IF:   Symptoms get worse or do not improve in 4 to 6 weeks, despite treatment.  New, unexplained symptoms develop. (Drugs used in treatment may produce  side effects.) EXERCISES  RANGE OF MOTION (ROM) AND STRETCHING EXERCISES - Impingement Syndrome (Rotator Cuff  Tendinitis, Bursitis) These exercises may help you when beginning to rehabilitate your injury. Your symptoms may go away with or without further involvement from your physician, physical therapist or athletic trainer. While completing these exercises, remember:   Restoring tissue flexibility helps normal motion to return to the joints. This allows healthier, less painful movement and activity.  An effective stretch should be held for at least 30 seconds.  A stretch should never be painful. You should only feel a gentle lengthening or release in the stretched tissue. STRETCH  Flexion, Standing  Stand with good posture. With an underhand grip on your right / left hand, and an overhand grip on the opposite hand, grasp a broomstick or cane so that your hands are a little more than shoulder width apart.  Keeping your right / left elbow straight and shoulder muscles relaxed, push the stick with your opposite hand, to raise your right / left arm in front of your body and then overhead. Raise your arm until you feel a stretch in your right / left shoulder, but before you have increased shoulder pain.  Try to avoid shrugging your right / left shoulder as your arm rises, by keeping your shoulder blade tucked down and toward your mid-back  spine. Hold for __________ seconds.  Slowly return to the starting position. Repeat __________ times. Complete this exercise __________ times per day. STRETCH  Abduction, Supine  Lie on your back. With an underhand grip on your right / left hand and an overhand grip on the opposite hand, grasp a broomstick or cane so that your hands are a little more than shoulder width apart.  Keeping your right / left elbow straight and your shoulder muscles relaxed, push the stick with your opposite hand, to raise your right / left arm out to the side of your body and then  overhead. Raise your arm until you feel a stretch in your right / left shoulder, but before you have increased shoulder pain.  Try to avoid shrugging your right / left shoulder as your arm rises, by keeping your shoulder blade tucked down and toward your mid-back spine. Hold for __________ seconds.  Slowly return to the starting position. Repeat __________ times. Complete this exercise __________ times per day. ROM  Flexion, Active-Assisted  Lie on your back. You may bend your knees for comfort.  Grasp a broomstick or cane so your hands are about shoulder width apart. Your right / left hand should grip the end of the stick, so that your hand is positioned "thumbs-up," as if you were about to shake hands.  Using your healthy arm to lead, raise your right / left arm overhead, until you feel a gentle stretch in your shoulder. Hold for __________ seconds.  Use the stick to assist in returning your right / left arm to its starting position. Repeat __________ times. Complete this exercise __________ times per day.  ROM - Internal Rotation, Supine   Lie on your back on a firm surface. Place your right / left elbow about 60 degrees away from your side. Elevate your elbow with a folded towel, so that the elbow and shoulder are the same height.  Using a broomstick or cane and your strong arm, pull your right / left hand toward your body until you feel a gentle stretch, but no increase in your shoulder pain. Keep your shoulder and elbow in place throughout the exercise.  Hold for __________ seconds. Slowly return to the starting position. Repeat __________ times. Complete this exercise __________ times per day. STRETCH - Internal Rotation  Place your right / left hand behind your back, palm up.  Throw a towel or belt over your opposite shoulder. Grasp the towel with your right / left hand.  While keeping an upright posture, gently pull up on the towel, until you feel a stretch in the front of your  right / left shoulder.  Avoid shrugging your right / left shoulder as your arm rises, by keeping your shoulder blade tucked down and toward your mid-back spine.  Hold for __________ seconds. Release the stretch, by lowering your healthy hand. Repeat __________ times. Complete this exercise __________ times per day. ROM - Internal Rotation   Using an underhand grip, grasp a stick behind your back with both hands.  While standing upright with good posture, slide the stick up your back until you feel a mild stretch in the front of your shoulder.  Hold for __________ seconds. Slowly return to your starting position. Repeat __________ times. Complete this exercise __________ times per day.  STRETCH  Posterior Shoulder Capsule   Stand or sit with good posture. Grasp your right / left elbow and draw it across your chest, keeping it at the same height as your  shoulder.  Pull your elbow, so your upper arm comes in closer to your chest. Pull until you feel a gentle stretch in the back of your shoulder.  Hold for __________ seconds. Repeat __________ times. Complete this exercise __________ times per day. STRENGTHENING EXERCISES - Impingement Syndrome (Rotator Cuff Tendinitis, Bursitis) These exercises may help you when beginning to rehabilitate your injury. They may resolve your symptoms with or without further involvement from your physician, physical therapist or athletic trainer. While completing these exercises, remember:  Muscles can gain both the endurance and the strength needed for everyday activities through controlled exercises.  Complete these exercises as instructed by your physician, physical therapist or athletic trainer. Increase the resistance and repetitions only as guided.  You may experience muscle soreness or fatigue, but the pain or discomfort you are trying to eliminate should never worsen during these exercises. If this pain does get worse, stop and make sure you are  following the directions exactly. If the pain is still present after adjustments, discontinue the exercise until you can discuss the trouble with your clinician.  During your recovery, avoid activity or exercises which involve actions that place your injured hand or elbow above your head or behind your back or head. These positions stress the tissues which you are trying to heal. STRENGTH - Scapular Depression and Adduction   With good posture, sit on a firm chair. Support your arms in front of you, with pillows, arm rests, or on a table top. Have your elbows in line with the sides of your body.  Gently draw your shoulder blades down and toward your mid-back spine. Gradually increase the tension, without tensing the muscles along the top of your shoulders and the back of your neck.  Hold for __________ seconds. Slowly release the tension and relax your muscles completely before starting the next repetition.  After you have practiced this exercise, remove the arm support and complete the exercise in standing as well as sitting position. Repeat __________ times. Complete this exercise __________ times per day.  STRENGTH - Shoulder Abductors, Isometric  With good posture, stand or sit about 4-6 inches from a wall, with your right / left side facing the wall.  Bend your right / left elbow. Gently press your right / left elbow into the wall. Increase the pressure gradually, until you are pressing as hard as you can, without shrugging your shoulder or increasing any shoulder discomfort.  Hold for __________ seconds.  Release the tension slowly. Relax your shoulder muscles completely before you begin the next repetition. Repeat __________ times. Complete this exercise __________ times per day.  STRENGTH - External Rotators, Isometric  Keep your right / left elbow at your side and bend it 90 degrees.  Step into a door frame so that the outside of your right / left wrist can press against the door  frame without your upper arm leaving your side.  Gently press your right / left wrist into the door frame, as if you were trying to swing the back of your hand away from your stomach. Gradually increase the tension, until you are pressing as hard as you can, without shrugging your shoulder or increasing any shoulder discomfort.  Hold for __________ seconds.  Release the tension slowly. Relax your shoulder muscles completely before you begin the next repetition. Repeat __________ times. Complete this exercise __________ times per day.  STRENGTH - Supraspinatus   Stand or sit with good posture. Grasp a __________ weight, or an exercise band  or tubing, so that your hand is "thumbs-up," like you are shaking hands.  Slowly lift your right / left arm in a "V" away from your thigh, diagonally into the space between your side and straight ahead. Lift your hand to shoulder height or as far as you can, without increasing any shoulder pain. At first, many people do not lift their hands above shoulder height.  Avoid shrugging your right / left shoulder as your arm rises, by keeping your shoulder blade tucked down and toward your mid-back spine.  Hold for __________ seconds. Control the descent of your hand, as you slowly return to your starting position. Repeat __________ times. Complete this exercise __________ times per day.  STRENGTH - External Rotators  Secure a rubber exercise band or tubing to a fixed object (table, pole) so that it is at the same height as your right / left elbow when you are standing or sitting on a firm surface.  Stand or sit so that the secured exercise band is at your uninjured side.  Bend your right / left elbow 90 degrees. Place a folded towel or small pillow under your right / left arm, so that your elbow is a few inches away from your side.  Keeping the tension on the exercise band, pull it away from your body, as if pivoting on your elbow. Be sure to keep your body  steady, so that the movement is coming only from your rotating shoulder.  Hold for __________ seconds. Release the tension in a controlled manner, as you return to the starting position. Repeat __________ times. Complete this exercise __________ times per day.  STRENGTH - Internal Rotators   Secure a rubber exercise band or tubing to a fixed object (table, pole) so that it is at the same height as your right / left elbow when you are standing or sitting on a firm surface.  Stand or sit so that the secured exercise band is at your right / left side.  Bend your elbow 90 degrees. Place a folded towel or small pillow under your right / left arm so that your elbow is a few inches away from your side.  Keeping the tension on the exercise band, pull it across your body, toward your stomach. Be sure to keep your body steady, so that the movement is coming only from your rotating shoulder.  Hold for __________ seconds. Release the tension in a controlled manner, as you return to the starting position. Repeat __________ times. Complete this exercise __________ times per day.  STRENGTH - Scapular Protractors, Standing   Stand arms length away from a wall. Place your hands on the wall, keeping your elbows straight.  Begin by dropping your shoulder blades down and toward your mid-back spine.  To strengthen your protractors, keep your shoulder blades down, but slide them forward on your rib cage. It will feel as if you are lifting the back of your rib cage away from the wall. This is a subtle motion and can be challenging to complete. Ask your caregiver for further instruction, if you are not sure you are doing the exercise correctly.  Hold for __________ seconds. Slowly return to the starting position, resting the muscles completely before starting the next repetition. Repeat __________ times. Complete this exercise __________ times per day. STRENGTH - Scapular Protractors, Supine  Lie on your back on  a firm surface. Extend your right / left arm straight into the air while holding a __________ weight in your hand.  Keeping your head and back in place, lift your shoulder off the floor.  Hold for __________ seconds. Slowly return to the starting position, and allow your muscles to relax completely before starting the next repetition. Repeat __________ times. Complete this exercise __________ times per day. STRENGTH - Scapular Protractors, Quadruped  Get onto your hands and knees, with your shoulders directly over your hands (or as close as you can be, comfortably).  Keeping your elbows locked, lift the back of your rib cage up into your shoulder blades, so your mid-back rounds out. Keep your neck muscles relaxed.  Hold this position for __________ seconds. Slowly return to the starting position and allow your muscles to relax completely before starting the next repetition. Repeat __________ times. Complete this exercise __________ times per day.  STRENGTH - Scapular Retractors  Secure a rubber exercise band or tubing to a fixed object (table, pole), so that it is at the height of your shoulders when you are either standing, or sitting on a firm armless chair.  With a palm down grip, grasp an end of the band in each hand. Straighten your elbows and lift your hands straight in front of you, at shoulder height. Step back, away from the secured end of the band, until it becomes tense.  Squeezing your shoulder blades together, draw your elbows back toward your sides, as you bend them. Keep your upper arms lifted away from your body throughout the exercise.  Hold for __________ seconds. Slowly ease the tension on the band, as you reverse the directions and return to the starting position. Repeat __________ times. Complete this exercise __________ times per day. STRENGTH - Shoulder Extensors   Secure a rubber exercise band or tubing to a fixed object (table, pole) so that it is at the height of  your shoulders when you are either standing, or sitting on a firm armless chair.  With a thumbs-up grip, grasp an end of the band in each hand. Straighten your elbows and lift your hands straight in front of you, at shoulder height. Step back, away from the secured end of the band, until it becomes tense.  Squeezing your shoulder blades together, pull your hands down to the sides of your thighs. Do not allow your hands to go behind you.  Hold for __________ seconds. Slowly ease the tension on the band, as you reverse the directions and return to the starting position. Repeat __________ times. Complete this exercise __________ times per day.  STRENGTH - Scapular Retractors and External Rotators   Secure a rubber exercise band or tubing to a fixed object (table, pole) so that it is at the height as your shoulders, when you are either standing, or sitting on a firm armless chair.  With a palm down grip, grasp an end of the band in each hand. Bend your elbows 90 degrees and lift your elbows to shoulder height, at your sides. Step back, away from the secured end of the band, until it becomes tense.  Squeezing your shoulder blades together, rotate your shoulders so that your upper arms and elbows remain stationary, but your fists travel upward to head height.  Hold for __________ seconds. Slowly ease the tension on the band, as you reverse the directions and return to the starting position. Repeat __________ times. Complete this exercise __________ times per day.  STRENGTH - Scapular Retractors and External Rotators, Rowing   Secure a rubber exercise band or tubing to a fixed object (table, pole) so that it  is at the height of your shoulders, when you are either standing, or sitting on a firm armless chair.  With a palm down grip, grasp an end of the band in each hand. Straighten your elbows and lift your hands straight in front of you, at shoulder height. Step back, away from the secured end of the  band, until it becomes tense.  Step 1: Squeeze your shoulder blades together. Bending your elbows, draw your hands to your chest, as if you are rowing a boat. At the end of this motion, your hands and elbow should be at shoulder height and your elbows should be out to your sides.  Step 2: Rotate your shoulders, to raise your hands above your head. Your forearms should be vertical and your upper arms should be horizontal.  Hold for __________ seconds. Slowly ease the tension on the band, as you reverse the directions and return to the starting position. Repeat __________ times. Complete this exercise __________ times per day.  STRENGTH  Scapular Depressors  Find a sturdy chair without wheels, such as a dining room chair.  Keeping your feet on the floor, and your hands on the chair arms, lift your bottom up from the seat, and lock your elbows.  Keeping your elbows straight, allow gravity to pull your body weight down. Your shoulders will rise toward your ears.  Raise your body against gravity by drawing your shoulder blades down your back, shortening the distance between your shoulders and ears. Although your feet should always maintain contact with the floor, your feet should progressively support less body weight, as you get stronger.  Hold for __________ seconds. In a controlled and slow manner, lower your body weight to begin the next repetition. Repeat __________ times. Complete this exercise __________ times per day.  Document Released: 03/02/2005 Document Revised: 05/25/2011 Document Reviewed: 06/14/2008 [facility] Patient Information 2013 [place], [place].

## 2012-07-19 NOTE — Progress Notes (Signed)
Subjective:    Patient ID: Miranda [name], female    DOB: 1945-09-13, 67 y.o.   MRN: [id number]  HPI  67 year old patient who is seen today for followup of hypertension. She has not been using amlodipine. She has a history of dyslipidemia and OSA. In general doing fairly well except for some right shoulder discomfort  Past Medical History  Diagnosis Date  . GERD (gastroesophageal reflux disease)   . Hyperlipidemia   . Low back pain   . Asthma   . Colon polyps   . Hemorrhoids   . Complication of anesthesia     states requires a lot med. to put her to sleep   . Family history of anesthesia complication   . CHF (congestive heart failure)     pt. unsure- but thinks she was hosp. for CHF- 2002  . [facility] kidney disease     [facility]  . Anxiety   . Depression     "sometimes "  . Shortness of breath   . Sleep apnea     uses c-pap- q night recently  . DDD (degenerative disc disease) 09/17/2011  . Glaucoma     bilateral, pt. admits that she is noncompliant to eye gtts.   . Hypertension     had stress, echo- 2006 /w [place], Cardiac Cath, per pt. 2002, echo repeated 2012- wnl     History   Social History  . Marital Status: Married    Spouse Name: N/A    Number of Children: N/A  . Years of Education: N/A   Occupational History  . retired Lawyer    Social History Main Topics  . Smoking status: Never Smoker   . Smokeless tobacco: Never Used  . Alcohol Use: No  . Drug Use: No  . Sexually Active: No   Other Topics Concern  . Not on file   Social History Narrative  . No narrative on file    Past Surgical History  Procedure Laterality Date  . Cardiac catheterization    . Colonoscopy      remote  . Abdominal hysterectomy      ectopic, fibroids  . Foot surgery      bilat, heel spurs- screw in R foot   . Flexible sigmoidoscopy  02/25/2011    Procedure: FLEXIBLE SIGMOIDOSCOPY;  Surgeon: Louis Meckel, MD;  Location: [facility];  Service: Endoscopy;   Laterality: N/A;  . Eye surgery      cataracts removed bilateral- ?IOL  . Sphincterotomy  10/08/2011    Procedure: SPHINCTEROTOMY;  Surgeon: Almond Lint, MD;  Location: [facility] OR;  Service: General;  Laterality: N/A;  . Fissurectomy  10/08/2011    Procedure: FISSURECTOMY;  Surgeon: Almond Lint, MD;  Location: [facility] OR;  Service: General;  Laterality: N/A;  . Hemorrhoid surgery  10/08/2011    Procedure: HEMORRHOIDECTOMY;  Surgeon: Almond Lint, MD;  Location: [facility] OR;  Service: General;  Laterality: N/A;  External     Family History  Problem Relation Age of Onset  . Cancer Mother     breast  . Heart attack Mother   . Heart disease Mother   . Cancer Other     breast, colon, lung  . Heart disease Other   . Anesthesia problems Neg Hx   . Emphysema Sister   . Cancer Sister     breast  . Asthma Sister   . Heart disease Sister   . Rheum arthritis Sister   . Cancer Brother  colon    Allergies  Allergen Reactions  . Fish Oil Anaphylaxis    REACTION: throat swelling  . Aspirin     REACTION: rash  . Ciprofloxacin Hives    Rash/hives/itch  . Influenza Vaccines Hives  . Ivp Dye (Iodinated Diagnostic Agents)   . Macrobid (Nitrofurantoin Monohydrate Macrocrystals) Hives  . Penicillins     REACTION: swelling  . Shellfish Allergy Hives    Patient also allergic to seafood  . Bactrim (Sulfamethoxazole W-Trimethoprim) Rash  . Ibuprofen Rash    REACTION: rash  . Latex Rash    Current Outpatient Prescriptions on File Prior to Visit  Medication Sig Dispense Refill  . ALPRAZolam (XANAX) 0.25 MG tablet Take 1 tablet (0.25 mg total) by mouth 3 (three) times daily as needed for sleep.  60 tablet  2  . amLODipine (NORVASC) 5 MG tablet Take 1 tablet (5 mg total) by mouth daily. Pt. Reports noncompliance to this med.  90 tablet  3  . benazepril-hydrochlorthiazide (LOTENSIN HCT) 20-12.5 MG per tablet Take 2 tablets by mouth daily with breakfast.  180 tablet  4  . bisacodyl (DULCOLAX) 5 MG EC  tablet Take 20 mg by mouth daily as needed. For severe constipation.      . cyclobenzaprine (FLEXERIL) 10 MG tablet TAKE ONE TABLET BY MOUTH THREE TIMES DAILY AS NEEDED  90 tablet  2  . Fluticasone-Salmeterol (ADVAIR) 250-50 MCG/DOSE AEPB Inhale 1 puff into the lungs every 12 (twelve) hours.  60 each  6  . furosemide (LASIX) 40 MG tablet Take 1 tablet (40 mg total) by mouth daily.  90 tablet  4  . HYDROcodone-acetaminophen (NORCO) 7.5-325 MG per tablet Take 1 tablet by mouth every 6 (six) hours as needed for pain.  60 tablet  1  . lovastatin (MEVACOR) 40 MG tablet Take 1 tablet (40 mg total) by mouth at bedtime.  90 tablet  6  . metoprolol (LOPRESSOR) 50 MG tablet Take 1 tablet (50 mg total) by mouth 2 (two) times daily.  180 tablet  4  . simethicone (MYLICON) 80 MG chewable tablet Chew 160 mg by mouth every 6 (six) hours as needed. For gas.       No current facility-administered medications on file prior to visit.    BP 160/90  Pulse 70  Temp(Src) 98.7 F (37.1 C) (Oral)  Resp 20  Wt 221 lb (100.245 kg)  BMI 36.2 kg/m2  SpO2 98%       Review of Systems  Constitutional: Negative.   HENT: Negative for hearing loss, congestion, sore throat, rhinorrhea, dental problem, sinus pressure and tinnitus.   Eyes: Negative for pain, discharge and visual disturbance.  Respiratory: Negative for cough and shortness of breath.   Cardiovascular: Negative for chest pain, palpitations and leg swelling.  Gastrointestinal: Negative for nausea, vomiting, abdominal pain, diarrhea, constipation, blood in stool and abdominal distention.  Genitourinary: Negative for dysuria, urgency, frequency, hematuria, flank pain, vaginal bleeding, vaginal discharge, difficulty urinating, vaginal pain and pelvic pain.  Musculoskeletal: Negative for joint swelling, arthralgias and gait problem.       Right shoulder pain with movement  Skin: Negative for rash.  Neurological: Negative for dizziness, syncope, speech  difficulty, weakness, numbness and headaches.  Hematological: Negative for adenopathy.  Psychiatric/Behavioral: Negative for behavioral problems, dysphoric mood and agitation. The patient is not nervous/anxious.        Objective:   Physical Exam  Musculoskeletal:  Abduction of right arm was limited to 120 External rotation leg test  negative Internal rotation leg test was positive as was the external rotation resistance test\ drop arm test negative          Assessment & Plan:   Hypertension. Suboptimal control. Compliance stressed we'll resume amlodipine Right rotator cuff  tendinopathy  Recheck 3 months

## 2012-07-20 ENCOUNTER — Telehealth: Payer: Self-pay | Admitting: *Deleted

## 2012-07-20 MED ORDER — MELOXICAM 7.5 MG PO TABS: 7.5000 mg | ORAL_TABLET | Freq: Every day | ORAL | Status: DC

## 2012-07-20 NOTE — Telephone Encounter (Signed)
Left message on voicemail to call office. Received fax from pharmacy needed to clarify Meloxicam 7.5 mg dosing is one tablet daily, not 4 times a day. New Rx sent to pharmacy.

## 2012-07-21 NOTE — Telephone Encounter (Signed)
Tried to contact pt both numbers unable to leave message. New Rx for Meloxicam sent to pharmacy is suppose to take once daily.

## 2012-07-22 NOTE — Telephone Encounter (Signed)
Pt stopped by the office told her new Rx for Meloxicam was sent to pharmacy and only take once a day. Pt verbalized understanding.

## 2012-08-29 ENCOUNTER — Encounter: Payer: Self-pay | Admitting: Internal Medicine

## 2012-08-29 ENCOUNTER — Ambulatory Visit (INDEPENDENT_AMBULATORY_CARE_PROVIDER_SITE_OTHER): Payer: Medicare HMO | Admitting: Internal Medicine

## 2012-08-29 VITALS — BP 140/90 | HR 67 | Temp 98.4°F | Resp 20 | Wt 222.0 lb

## 2012-08-29 DIAGNOSIS — IMO0002 Reserved for concepts with insufficient information to code with codable children: Secondary | ICD-10-CM

## 2012-08-29 DIAGNOSIS — M25511 Pain in right shoulder: Secondary | ICD-10-CM

## 2012-08-29 DIAGNOSIS — M25519 Pain in unspecified shoulder: Secondary | ICD-10-CM

## 2012-08-29 MED ORDER — DIBUCAINE 1 % EX OINT: TOPICAL_OINTMENT | Freq: Three times a day (TID) | CUTANEOUS | Status: DC | PRN

## 2012-08-29 MED ORDER — LACTULOSE 10 GM/15ML PO SOLN: 20.0000 g | Freq: Three times a day (TID) | ORAL | Status: DC

## 2012-08-29 MED ORDER — HYDROCODONE-ACETAMINOPHEN 7.5-325 MG PO TABS: 1.0000 | ORAL_TABLET | Freq: Four times a day (QID) | ORAL | Status: DC | PRN

## 2012-08-29 NOTE — Patient Instructions (Signed)
Orthopedic evaluation as discussed  Return in 6 months for follow-up

## 2012-08-29 NOTE — Progress Notes (Signed)
Subjective:    Patient ID: Miranda [name], female    DOB: 1945/05/28, 67 y.o.   MRN: [id number]  HPI  67 year old right-handed female who presents today complaining of persistent right shoulder pain. She was seen here one month ago and treated with the IM Depo-Medrol. Shoulder pain has worsened. Pain is aggravated by movement. No history of trauma. She does have a history of generalized osteoarthritis.  Past Medical History  Diagnosis Date  . GERD (gastroesophageal reflux disease)   . Hyperlipidemia   . Low back pain   . Asthma   . Colon polyps   . Hemorrhoids   . Complication of anesthesia     states requires a lot med. to put her to sleep   . Family history of anesthesia complication   . CHF (congestive heart failure)     pt. unsure- but thinks she was hosp. for CHF- 2002  . Chronic kidney disease     [facility]  . Anxiety   . Depression     "sometimes "  . Shortness of breath   . Sleep apnea     uses c-pap- q night recently  . DDD (degenerative disc disease) 09/17/2011  . Glaucoma     bilateral, pt. admits that she is noncompliant to eye gtts.   . Hypertension     had stress, echo- 2006 /w [place], Cardiac Cath, per pt. 2002, echo repeated 2012- wnl     History   Social History  . Marital Status: Married    Spouse Name: N/A    Number of Children: N/A  . Years of Education: N/A   Occupational History  . retired Lawyer    Social History Main Topics  . Smoking status: Never Smoker   . Smokeless tobacco: Never Used  . Alcohol Use: No  . Drug Use: No  . Sexually Active: No   Other Topics Concern  . Not on file   Social History Narrative  . No narrative on file    Past Surgical History  Procedure Laterality Date  . Cardiac catheterization    . Colonoscopy      remote  . Abdominal hysterectomy      ectopic, fibroids  . Foot surgery      bilat, heel spurs- screw in R foot   . Flexible sigmoidoscopy  02/25/2011    Procedure: FLEXIBLE  SIGMOIDOSCOPY;  Surgeon: Louis Meckel, MD;  Location: [facility];  Service: Endoscopy;  Laterality: N/A;  . Eye surgery      cataracts removed bilateral- ?IOL  . Sphincterotomy  10/08/2011    Procedure: SPHINCTEROTOMY;  Surgeon: Almond Lint, MD;  Location: [facility] OR;  Service: General;  Laterality: N/A;  . Fissurectomy  10/08/2011    Procedure: FISSURECTOMY;  Surgeon: Almond Lint, MD;  Location: [facility] OR;  Service: General;  Laterality: N/A;  . Hemorrhoid surgery  10/08/2011    Procedure: HEMORRHOIDECTOMY;  Surgeon: Almond Lint, MD;  Location: [facility] OR;  Service: General;  Laterality: N/A;  External     Family History  Problem Relation Age of Onset  . Cancer Mother     breast  . Heart attack Mother   . Heart disease Mother   . Cancer Other     breast, colon, lung  . Heart disease Other   . Anesthesia problems Neg Hx   . Emphysema Sister   . Cancer Sister     breast  . Asthma Sister   . Heart disease Sister   .  Rheum arthritis Sister   . Cancer Brother     colon    Allergies  Allergen Reactions  . Fish Oil Anaphylaxis    REACTION: throat swelling  . Aspirin     REACTION: rash  . Ciprofloxacin Hives    Rash/hives/itch  . Influenza Vaccines Hives  . Ivp Dye (Iodinated Diagnostic Agents)   . Macrobid (Nitrofurantoin Monohydrate Macrocrystals) Hives  . Penicillins     REACTION: swelling  . Shellfish Allergy Hives    Patient also allergic to seafood  . Bactrim (Sulfamethoxazole W-Trimethoprim) Rash  . Ibuprofen Rash    REACTION: rash  . Latex Rash    Current Outpatient Prescriptions on File Prior to Visit  Medication Sig Dispense Refill  . albuterol (PROVENTIL HFA;VENTOLIN HFA) 108 (90 BASE) MCG/ACT inhaler Inhale 2 puffs into the lungs every 6 (six) hours as needed.  1 Inhaler  4  . ALPRAZolam (XANAX) 0.25 MG tablet Take 1 tablet (0.25 mg total) by mouth 3 (three) times daily as needed for sleep.  60 tablet  2  . amLODipine (NORVASC) 5 MG tablet Take 1 tablet (5 mg  total) by mouth daily. Pt. Reports noncompliance to this med.  90 tablet  3  . benazepril-hydrochlorthiazide (LOTENSIN HCT) 20-12.5 MG per tablet Take 2 tablets by mouth daily with breakfast.  180 tablet  4  . bisacodyl (DULCOLAX) 5 MG EC tablet Take 20 mg by mouth daily as needed. For severe constipation.      . cyclobenzaprine (FLEXERIL) 10 MG tablet TAKE ONE TABLET BY MOUTH THREE TIMES DAILY AS NEEDED  90 tablet  2  . dibucaine (NUPERCAINAL) 1 % ointment Apply topically 3 (three) times daily as needed for pain.  30 g  0  . Fluticasone-Salmeterol (ADVAIR) 250-50 MCG/DOSE AEPB Inhale 1 puff into the lungs every 12 (twelve) hours.  60 each  6  . furosemide (LASIX) 40 MG tablet Take 1 tablet (40 mg total) by mouth daily.  90 tablet  4  . lovastatin (MEVACOR) 40 MG tablet Take 1 tablet (40 mg total) by mouth at bedtime.  90 tablet  6  . meloxicam (MOBIC) 7.5 MG tablet Take 1 tablet (7.5 mg total) by mouth daily.  60 tablet  1  . metoprolol (LOPRESSOR) 50 MG tablet Take 1 tablet (50 mg total) by mouth 2 (two) times daily.  180 tablet  4  . omeprazole (PRILOSEC) 20 MG capsule Take 2 capsules (40 mg total) by mouth daily.  30 capsule  6  . simethicone (MYLICON) 80 MG chewable tablet Chew 160 mg by mouth every 6 (six) hours as needed. For gas.       No current facility-administered medications on file prior to visit.    BP 140/90  Pulse 67  Temp(Src) 98.4 F (36.9 C) (Oral)  Resp 20  Wt 222 lb (100.699 kg)  BMI 36.37 kg/m2  SpO2 97%       Review of Systems  Constitutional: Negative.   HENT: Negative for hearing loss, congestion, sore throat, rhinorrhea, dental problem, sinus pressure and tinnitus.   Eyes: Negative for pain, discharge and visual disturbance.  Respiratory: Negative for cough and shortness of breath.   Cardiovascular: Negative for chest pain, palpitations and leg swelling.  Gastrointestinal: Negative for nausea, vomiting, abdominal pain, diarrhea, constipation, blood in  stool and abdominal distention.  Genitourinary: Negative for dysuria, urgency, frequency, hematuria, flank pain, vaginal bleeding, vaginal discharge, difficulty urinating, vaginal pain and pelvic pain.  Musculoskeletal: Negative for joint swelling, arthralgias  and gait problem.       Right shoulder pain  Skin: Negative for rash.  Neurological: Negative for dizziness, syncope, speech difficulty, weakness, numbness and headaches.  Hematological: Negative for adenopathy.  Psychiatric/Behavioral: Negative for behavioral problems, dysphoric mood and agitation. The patient is not nervous/anxious.        Objective:   Physical Exam  Constitutional: She appears well-developed and well-nourished. No distress.  Musculoskeletal:  Painful arc test and drop arm test negative Patient had pain with both internal and external rotation lag test as well as external rotation resistance test          Assessment & Plan:   Persistent right shoulder pain. We'll set up for orthopedic evaluation Hypertension stable Osteoarthritis

## 2012-09-14 ENCOUNTER — Encounter: Payer: Self-pay | Admitting: Internal Medicine

## 2012-10-07 ENCOUNTER — Other Ambulatory Visit: Payer: Self-pay | Admitting: Internal Medicine

## 2012-10-07 DIAGNOSIS — Z1231 Encounter for screening mammogram for malignant neoplasm of breast: Secondary | ICD-10-CM

## 2012-10-19 ENCOUNTER — Ambulatory Visit (HOSPITAL_COMMUNITY): Payer: Medicare HMO

## 2012-11-15 ENCOUNTER — Other Ambulatory Visit: Payer: Self-pay | Admitting: Internal Medicine

## 2012-11-23 ENCOUNTER — Ambulatory Visit: Payer: Medicare Other | Admitting: Pulmonary Disease

## 2012-11-28 ENCOUNTER — Encounter: Payer: Self-pay | Admitting: Pulmonary Disease

## 2012-12-23 ENCOUNTER — Emergency Department (HOSPITAL_COMMUNITY): Payer: Medicare HMO

## 2012-12-23 ENCOUNTER — Emergency Department (HOSPITAL_COMMUNITY)
Admission: EM | Admit: 2012-12-23 | Discharge: 2012-12-23 | Disposition: A | Discharge status: Home / Self Care | Payer: Medicare HMO | Attending: Emergency Medicine | Admitting: Emergency Medicine

## 2012-12-23 DIAGNOSIS — F329 Major depressive disorder, single episode, unspecified: Secondary | ICD-10-CM | POA: Insufficient documentation

## 2012-12-23 DIAGNOSIS — Z880 Allergy status to penicillin: Secondary | ICD-10-CM | POA: Insufficient documentation

## 2012-12-23 DIAGNOSIS — F411 Generalized anxiety disorder: Secondary | ICD-10-CM | POA: Insufficient documentation

## 2012-12-23 DIAGNOSIS — M25561 Pain in right knee: Secondary | ICD-10-CM

## 2012-12-23 DIAGNOSIS — Z79899 Other long term (current) drug therapy: Secondary | ICD-10-CM | POA: Insufficient documentation

## 2012-12-23 DIAGNOSIS — Z862 Personal history of diseases of the blood and blood-forming organs and certain disorders involving the immune mechanism: Secondary | ICD-10-CM | POA: Insufficient documentation

## 2012-12-23 DIAGNOSIS — N189 Chronic kidney disease, unspecified: Secondary | ICD-10-CM | POA: Insufficient documentation

## 2012-12-23 DIAGNOSIS — I509 Heart failure, unspecified: Secondary | ICD-10-CM | POA: Insufficient documentation

## 2012-12-23 DIAGNOSIS — Z860100 Personal history of colon polyps, unspecified: Secondary | ICD-10-CM | POA: Insufficient documentation

## 2012-12-23 DIAGNOSIS — J45909 Unspecified asthma, uncomplicated: Secondary | ICD-10-CM | POA: Insufficient documentation

## 2012-12-23 DIAGNOSIS — M25569 Pain in unspecified knee: Secondary | ICD-10-CM | POA: Insufficient documentation

## 2012-12-23 DIAGNOSIS — Z8669 Personal history of other diseases of the nervous system and sense organs: Secondary | ICD-10-CM | POA: Insufficient documentation

## 2012-12-23 DIAGNOSIS — F3289 Other specified depressive episodes: Secondary | ICD-10-CM | POA: Insufficient documentation

## 2012-12-23 DIAGNOSIS — Z8639 Personal history of other endocrine, nutritional and metabolic disease: Secondary | ICD-10-CM | POA: Insufficient documentation

## 2012-12-23 DIAGNOSIS — Z91040 Latex allergy status: Secondary | ICD-10-CM | POA: Insufficient documentation

## 2012-12-23 DIAGNOSIS — I129 Hypertensive chronic kidney disease with stage 1 through stage 4 chronic kidney disease, or unspecified chronic kidney disease: Secondary | ICD-10-CM | POA: Insufficient documentation

## 2012-12-23 DIAGNOSIS — K219 Gastro-esophageal reflux disease without esophagitis: Secondary | ICD-10-CM | POA: Insufficient documentation

## 2012-12-23 MED ORDER — HYDROCODONE-ACETAMINOPHEN 10-325 MG PO TABS: 1.0000 | ORAL_TABLET | Freq: Four times a day (QID) | ORAL | Status: DC | PRN

## 2012-12-23 NOTE — ED Provider Notes (Signed)
CSN: [id number]     Arrival date & time 12/23/12  1101 History  This chart was scribed for Arthor Captain, PA working with Gavin Pound. Oletta Lamas, MD by Quintella Reichert, ED Scribe. This patient was seen in room WTR7/WTR7 and the patient's care was started at 1:07 PM.  Chief Complaint: Knee Pain   The history is provided by the patient. No language interpreter was used.    HPI Comments: Miranda [name] is a 67 y.o. female with h/o degenerative disc disease who presents to the Emergency Department complaining of one week of progressively-worsening right knee pain.  Pt denies injuries that may have caused pain.  She denies heat, redness or swelling.  She states pain is worsened with walking and is improved with rest.  She also complains of difficulty completing ADLs including going up and down stairs and squatting.  She has attempted to treat pain with Tylenol, without relief.  She denies fever, neck pain, sore throat, visual disturbance, CP, cough, SOB, abdominal pain, nausea, emesis, diarrhea, urinary symptoms, back pain, HA, weakness, numbness and rash as associated symptoms.      Past Medical History  Diagnosis Date  . GERD (gastroesophageal reflux disease)   . Hyperlipidemia   . Low back pain   . Asthma   . Colon polyps   . Hemorrhoids   . Complication of anesthesia     states requires a lot med. to put her to sleep   . Family history of anesthesia complication   . CHF (congestive heart failure)     pt. unsure- but thinks she was hosp. for CHF- 2002  . Chronic kidney disease     [facility]  . Anxiety   . Depression     "sometimes "  . Shortness of breath   . Sleep apnea     uses c-pap- q night recently  . DDD (degenerative disc disease) 09/17/2011  . Glaucoma     bilateral, pt. admits that she is noncompliant to eye gtts.   . Hypertension     had stress, echo- 2006 /w [place], Cardiac Cath, per pt. 2002, echo repeated 2012- wnl     Past Surgical History  Procedure  Laterality Date  . Cardiac catheterization    . Colonoscopy      remote  . Abdominal hysterectomy      ectopic, fibroids  . Foot surgery      bilat, heel spurs- screw in R foot   . Flexible sigmoidoscopy  02/25/2011    Procedure: FLEXIBLE SIGMOIDOSCOPY;  Surgeon: Louis Meckel, MD;  Location: [facility];  Service: Endoscopy;  Laterality: N/A;  . Eye surgery      cataracts removed bilateral- ?IOL  . Sphincterotomy  10/08/2011    Procedure: SPHINCTEROTOMY;  Surgeon: Almond Lint, MD;  Location: [facility] OR;  Service: General;  Laterality: N/A;  . Fissurectomy  10/08/2011    Procedure: FISSURECTOMY;  Surgeon: Almond Lint, MD;  Location: [facility] OR;  Service: General;  Laterality: N/A;  . Hemorrhoid surgery  10/08/2011    Procedure: HEMORRHOIDECTOMY;  Surgeon: Almond Lint, MD;  Location: [facility] OR;  Service: General;  Laterality: N/A;  External     Family History  Problem Relation Age of Onset  . Cancer Mother     breast  . Heart attack Mother   . Heart disease Mother   . Cancer Other     breast, colon, lung  . Heart disease Other   . Anesthesia problems Neg Hx   .  Emphysema Sister   . Cancer Sister     breast  . Asthma Sister   . Heart disease Sister   . Rheum arthritis Sister   . Cancer Brother     colon    History  Substance Use Topics  . Smoking status: Never Smoker   . Smokeless tobacco: Never Used  . Alcohol Use: No    OB History   Grav Para Term Preterm Abortions TAB SAB Ect Mult Living                  Review of Systems A complete 10 system review of systems was obtained and all systems are negative except as noted in the HPI and PMH.    Allergies  Fish oil; Penicillins; Aspirin; Bactrim; Ciprofloxacin; Ibuprofen; Influenza vaccines; Ivp dye; Latex; Macrobid; and Shellfish allergy  Home Medications   Current Outpatient Rx  Name  Route  Sig  Dispense  Refill  . albuterol (PROVENTIL HFA;VENTOLIN HFA) 108 (90 BASE) MCG/ACT inhaler   Inhalation   Inhale 2 puffs  into the lungs every 6 (six) hours as needed.   1 Inhaler   4   . ALPRAZolam (XANAX) 0.25 MG tablet   Oral   Take 1 tablet (0.25 mg total) by mouth 3 (three) times daily as needed for sleep.   60 tablet   2   . amLODipine (NORVASC) 5 MG tablet   Oral   Take 1 tablet (5 mg total) by mouth daily. Pt. Reports noncompliance to this med.   90 tablet   3   . benazepril-hydrochlorthiazide (LOTENSIN HCT) 20-12.5 MG per tablet   Oral   Take 2 tablets by mouth daily with breakfast.   180 tablet   4   . bisacodyl (DULCOLAX) 5 MG EC tablet   Oral   Take 20 mg by mouth daily as needed. For severe constipation.         . cyclobenzaprine (FLEXERIL) 10 MG tablet   Oral   Take 10 mg by mouth 3 (three) times daily as needed for muscle spasms.         . Fluticasone-Salmeterol (ADVAIR) 250-50 MCG/DOSE AEPB   Inhalation   Inhale 1 puff into the lungs every 12 (twelve) hours.   60 each   6   . furosemide (LASIX) 40 MG tablet   Oral   Take 1 tablet (40 mg total) by mouth daily.   90 tablet   4   . HYDROcodone-acetaminophen (NORCO) 10-325 MG per tablet   Oral   Take 1 tablet by mouth every 6 (six) hours as needed for pain.         Marland Kitchen lactulose (CHRONULAC) 10 GM/15ML solution   Oral   Take 30 mLs (20 g total) by mouth 3 (three) times daily.   500 mL   2   . lovastatin (MEVACOR) 40 MG tablet   Oral   Take 1 tablet (40 mg total) by mouth at bedtime.   90 tablet   6   . meloxicam (MOBIC) 7.5 MG tablet   Oral   Take 1 tablet (7.5 mg total) by mouth daily.   60 tablet   1   . metoprolol (LOPRESSOR) 50 MG tablet   Oral   Take 1 tablet (50 mg total) by mouth 2 (two) times daily.   180 tablet   4   . naproxen sodium (ALEVE) 220 MG tablet   Oral   Take 660 mg by mouth daily as needed (  pain).          . omeprazole (PRILOSEC) 20 MG capsule   Oral   Take 2 capsules (40 mg total) by mouth daily.   30 capsule   6   . simethicone (MYLICON) 80 MG chewable tablet    Oral   Chew 160 mg by mouth every 6 (six) hours as needed. For gas.          BP 183/80  Pulse 80  Temp(Src) 98.3 F (36.8 C) (Oral)  Resp 16  SpO2 97%  Physical Exam  Nursing note and vitals reviewed. Constitutional: She is oriented to person, place, and time. She appears well-developed and well-nourished. No distress.  HENT:  Head: Normocephalic and atraumatic.  Eyes: EOM are normal.  Neck: Neck supple. No tracheal deviation present.  Cardiovascular: Normal rate.   Pulmonary/Chest: Effort normal. No respiratory distress.  Musculoskeletal:  Tenderness to right knee with ROM.  No heat, redness, swelling or crepitus.  Neurovascularly intact.  Decreased ROM due to pain.  Neurological: She is alert and oriented to person, place, and time.  Skin: Skin is warm and dry.  Psychiatric: She has a normal mood and affect. Her behavior is normal.    ED Course  Procedures (including critical care time)  DIAGNOSTIC STUDIES: Oxygen Saturation is 97% on room air, normal by my interpretation.    COORDINATION OF CARE: 1:12 PM-Informed pt that imaging is negative for fracture.  Discussed treatment plan with pt at bedside and pt agreed to plan.    Labs Review Labs Reviewed - No data to display   Imaging Review Dg Knee Complete 4 Views Right  12/23/2012   CLINICAL DATA:  Right knee pain, no known injury  EXAM: RIGHT KNEE - COMPLETE 4+ VIEW  COMPARISON:  None.  FINDINGS: Four views of the right knee submitted. Mild osteopenia. No acute fracture or subluxation. Mild narrowing of patellofemoral joint space. Minimal spurring of medial tibial plateau. No joint effusion.  IMPRESSION: No acute fracture or subluxation. Minimal degenerative changes.   Electronically Signed   By: Natasha Mead M.D.   On: 12/23/2012 12:10    EKG Interpretation   None       MDM   1. Knee pain, right    Knee pain, imaging shows chronic changes of osteoarthritis.  Pt requests pain medication.  Will d/c with  Norco, f/u with PCP.    I personally performed the services described in this documentation, which was scribed in my presence. The recorded information has been reviewed and is accurate.     Arthor Captain, PA-C 12/24/12 0840  Arthor Captain, PA-C 12/24/12 0840

## 2012-12-23 NOTE — ED Notes (Addendum)
Pt states her right knee hurts on the inside area of the joint. Pt states she  began to feel the pain yesterday but the pain has gotten progressively severe. Pt does not remember hittiing it or steping wrong. Pt has not taken her BP medication in a few days also because her sister is in the hospital on life support and she has not been home to take meds.

## 2012-12-24 NOTE — ED Provider Notes (Signed)
Medical screening examination/treatment/procedure(s) were performed by non-physician practitioner and as supervising physician I was immediately available for consultation/collaboration.   Calypso Hagarty Y. Tacara Hadlock, MD 12/24/12 1421 

## 2012-12-28 ENCOUNTER — Telehealth: Payer: Self-pay | Admitting: Internal Medicine

## 2012-12-28 MED ORDER — HYDROCODONE-ACETAMINOPHEN 10-325 MG PO TABS: 1.0000 | ORAL_TABLET | Freq: Four times a day (QID) | ORAL | Status: DC | PRN

## 2012-12-28 NOTE — Telephone Encounter (Signed)
Pt needs new rx hydrocodone °

## 2012-12-28 NOTE — Telephone Encounter (Signed)
Pt requesting refill Hydrocodone, please advise how many?

## 2012-12-28 NOTE — Telephone Encounter (Signed)
90

## 2012-12-28 NOTE — Telephone Encounter (Signed)
Left message Rx is ready for pick up. Rx printed and signed and put at the front desk.

## 2013-02-27 ENCOUNTER — Ambulatory Visit: Payer: Medicare HMO | Admitting: Internal Medicine

## 2013-02-28 ENCOUNTER — Encounter: Payer: Self-pay | Admitting: Internal Medicine

## 2013-02-28 NOTE — Telephone Encounter (Signed)
Spoke to pt told her no appointments available today can schedule her tomorrow with D.r Burchette due to Dr. Kirtland Bouchard is out. Pt said okay. Appointment scheduled for 12/17 at 3:30 PM with Dr. Caryl Never.

## 2013-02-28 NOTE — Telephone Encounter (Signed)
[facility] °Triage Call Report °Triage Record Num: [id number] Operator: Ronald Ayers °Patient Name: Miranda [name] Call Date & Time: 02/25/2013 12:43:28PM °Patient Phone: [phone] PCP: Peter F. Kwiatkowski °Patient Gender: Female PCP Fax : [phone] °Patient DOB: 03/09/1939 Practice Name: [facility] °Reason for Call: °Caller: Toni; PCP: Kwiatkowski, Peter (Family Practice > 16yrs old); CB#: [phone]; °Call regarding Medication Issue; Medication(s): losartan ; [facility] and s/w Toni, °Pharmacist concerning pt is out of his Losartan 100 mg po daily; emergent sxs r/o per °"Medication Questions - Adult" protocol except "Requests refill of medication that poses °clinical risk to patient if not available within 8 hours" positive with disposition of "Call °Dispensing Pharmacy or Provider Immediately"; per Rx Standing Orders, CAN RN °authorized [facility] pharmacist to give partial refill of Losartan 100 mg po daily, #3, no refills °and have pt call the office on 02/27/2013 for refills. °Protocol(s) Used: Medication Questions - Adult °Recommended Outcome per Protocol: Call Dispensing Pharmacy or Provider Immediately °Override Outcome if Used in Protocol: Call Provider within 72 Hours °RN Reason for Override Outcome: Rx Standing Orders Used. °Reason for Outcome: Requests refill of medication that poses clinical °risk to patient if not available within 8 hours °Care Advice: °~ ° °

## 2013-02-28 NOTE — Telephone Encounter (Signed)
Patient Information:  Caller Name: Abbie Sons  Phone: [phone]  Patient: Miranda [name], Miranda [name]  Gender: Female  DOB: [birth year]  Age: 67 Years  PCP: Eleonore Chiquito (Family Practice > 34yrs old)  Office Follow Up:  Does the office need to follow up with this patient?: Yes  Instructions For The Office: Appts full. Other locations full also. Please call and advise.   Symptoms  Reason For Call & Symptoms: Pt is calling with a harsh sounding cough. Onset Sunday. Pt has a sore chest d/t constant cough. Pt feels weak and dizzy.  Reviewed Health History In EMR: Yes  Reviewed Medications In EMR: Yes  Reviewed Allergies In EMR: Yes  Reviewed Surgeries / Procedures: Yes  Date of Onset of Symptoms: 02/26/2013  Treatments Tried: theraflu  Treatments Tried Worked: No  Guideline(s) Used:  Cough  Disposition Per Guideline:   See Today in Office  Reason For Disposition Reached:   Severe coughing spells (e.g., whooping sound after coughing, vomiting after coughing)  Advice Given:  N/A  Patient Will Follow Care Advice:  YES

## 2013-02-28 NOTE — Telephone Encounter (Signed)
Left message on voicemail to call office.  

## 2013-02-28 NOTE — Telephone Encounter (Signed)
Pt returning call to Lupita Leash, requesting call back.

## 2013-03-01 ENCOUNTER — Encounter: Payer: Self-pay | Admitting: Family Medicine

## 2013-03-01 ENCOUNTER — Ambulatory Visit (INDEPENDENT_AMBULATORY_CARE_PROVIDER_SITE_OTHER): Payer: Medicare HMO | Admitting: Family Medicine

## 2013-03-01 ENCOUNTER — Encounter: Payer: Self-pay | Admitting: Gastroenterology

## 2013-03-01 VITALS — BP 140/78 | HR 99 | Temp 99.8°F | Wt 215.0 lb

## 2013-03-01 DIAGNOSIS — R300 Dysuria: Secondary | ICD-10-CM

## 2013-03-01 DIAGNOSIS — J209 Acute bronchitis, unspecified: Secondary | ICD-10-CM

## 2013-03-01 LAB — POCT URINALYSIS DIPSTICK
Glucose, UA: NEGATIVE
Nitrite, UA: NEGATIVE
Spec Grav, UA: >=1.03
Urobilinogen, UA: 2

## 2013-03-01 MED ORDER — HYDROCODONE-HOMATROPINE 5-1.5 MG/5ML PO SYRP: 5.0000 mL | ORAL_SOLUTION | Freq: Four times a day (QID) | ORAL | Status: AC | PRN

## 2013-03-01 NOTE — Progress Notes (Signed)
Subjective:    Patient ID: Miranda [name], female    DOB: Oct 23, 1945, 67 y.o.   MRN: [id number]  HPI Patient seen as a work in with acute illness. Onset about one week ago cough, nasal congestion and low-grade fever. Her fever has declined over the past couple days. Cough is dry nonproductive. Severe at night. She has multiple drug allergies but has taken hydrocodone without difficulty the past. Getting rundown from lack of sleep.  She had some mild diarrhea last week but none since then. Some increased fatigue. She relates some urine frequency but no burning with urination.  Past Medical History  Diagnosis Date  . GERD (gastroesophageal reflux disease)   . Hyperlipidemia   . Low back pain   . Asthma   . Colon polyps   . Hemorrhoids   . Complication of anesthesia     states requires a lot med. to put her to sleep   . Family history of anesthesia complication   . CHF (congestive heart failure)     pt. unsure- but thinks she was hosp. for CHF- 2002  . [facility] kidney disease     [facility]  . Anxiety   . Depression     "sometimes "  . Shortness of breath   . Sleep apnea     uses c-pap- q night recently  . DDD (degenerative disc disease) 09/17/2011  . Glaucoma     bilateral, pt. admits that she is noncompliant to eye gtts.   . Hypertension     had stress, echo- 2006 /w [place], Cardiac Cath, per pt. 2002, echo repeated 2012- wnl    Past Surgical History  Procedure Laterality Date  . Cardiac catheterization    . Colonoscopy      remote  . Abdominal hysterectomy      ectopic, fibroids  . Foot surgery      bilat, heel spurs- screw in R foot   . Flexible sigmoidoscopy  02/25/2011    Procedure: FLEXIBLE SIGMOIDOSCOPY;  Surgeon: Louis Meckel, MD;  Location: [facility];  Service: Endoscopy;  Laterality: N/A;  . Eye surgery      cataracts removed bilateral- ?IOL  . Sphincterotomy  10/08/2011    Procedure: SPHINCTEROTOMY;  Surgeon: Almond Lint, MD;   Location: [facility] OR;  Service: General;  Laterality: N/A;  . Fissurectomy  10/08/2011    Procedure: FISSURECTOMY;  Surgeon: Almond Lint, MD;  Location: [facility] OR;  Service: General;  Laterality: N/A;  . Hemorrhoid surgery  10/08/2011    Procedure: HEMORRHOIDECTOMY;  Surgeon: Almond Lint, MD;  Location: [facility] OR;  Service: General;  Laterality: N/A;  External     reports that she has never smoked. She has never used smokeless tobacco. She reports that she does not drink alcohol or use illicit drugs. family history includes Asthma in her sister; Cancer in her brother, mother, other, and sister; Emphysema in her sister; Heart attack in her mother; Heart disease in her mother, other, and sister; Rheum arthritis in her sister. There is no history of Anesthesia problems. Allergies  Allergen Reactions  . Fish Oil Anaphylaxis  . Penicillins Anaphylaxis  . Aspirin Itching and Rash  . Bactrim [Sulfamethoxazole-Trimethoprim] Hives, Itching and Rash  . Ciprofloxacin Hives, Itching and Rash  . Ibuprofen Rash    REACTION: rash  . Influenza Vaccines Hives  . Ivp Dye [Iodinated Diagnostic Agents] Hives, Itching and Rash    Gives benadryl to counteract symptoms  . Latex Rash  . Macrobid [employer]  Monohydrate Macrocrystals] Hives  . Shellfish Allergy Hives    Patient also allergic to seafood      Review of Systems  Constitutional: Positive for fatigue. Negative for chills.  HENT: Positive for congestion. Negative for sore throat.   Respiratory: Positive for cough. Negative for shortness of breath and wheezing.   Gastrointestinal: Negative for nausea and vomiting.       Objective:   Physical Exam  Constitutional: She appears well-developed and well-nourished.  HENT:  Left Ear: External ear normal.  Cerumen impaction right canal  Neck: Neck supple.  Cardiovascular: Normal rate.   Pulmonary/Chest: Effort normal and breath sounds normal. No respiratory distress. She has no wheezes. She has no  rales.  Lymphadenopathy:    She has no cervical adenopathy.          Assessment & Plan:  #1 acute bronchitis. Suspect viral. Hycodan cough syrup for nighttime use as needed #2 dysuria. She has mostly urine frequency but no significant burning. She has very concentrated looking urine with 1+ leukocytes. Urine culture sent. She has multiple antibiotic allergies which makes treatment difficult- we'll await culture results unless she develops any fever or worsening symptoms

## 2013-03-01 NOTE — Progress Notes (Signed)
Pt coming in today to see Dr. Caryl Never for cold and cough.

## 2013-03-01 NOTE — Patient Instructions (Signed)
Follow up for any increased fever or shortness of breath. 

## 2013-03-01 NOTE — Progress Notes (Signed)
Pre visit review using our clinic review tool, if applicable. No additional management support is needed unless otherwise documented below in the visit note. 

## 2013-03-03 LAB — URINE CULTURE
Colony Count: NO GROWTH
Organism ID, Bacteria: NO GROWTH

## 2013-03-06 ENCOUNTER — Telehealth: Payer: Self-pay | Admitting: Internal Medicine

## 2013-03-06 ENCOUNTER — Ambulatory Visit: Payer: Medicare HMO | Admitting: Internal Medicine

## 2013-03-06 NOTE — Telephone Encounter (Signed)
Pt informed

## 2013-03-06 NOTE — Telephone Encounter (Signed)
Pt saw dr Caryl Never and states she received call  Returning your call  Ok to leave message.

## 2013-03-22 ENCOUNTER — Telehealth: Payer: Self-pay | Admitting: Internal Medicine

## 2013-03-22 NOTE — Telephone Encounter (Signed)
Pt request refill of HYDROcodone-acetaminophen (NORCO) 10-325 MG per tablet Pt states it is due 1/17.

## 2013-03-27 ENCOUNTER — Telehealth: Payer: Self-pay | Admitting: Internal Medicine

## 2013-03-27 DIAGNOSIS — M549 Dorsalgia, unspecified: Secondary | ICD-10-CM

## 2013-03-27 NOTE — Telephone Encounter (Signed)
Okay to do referral to Dr. Jillene Bucks for back pain and injections?

## 2013-03-27 NOTE — Telephone Encounter (Signed)
Ok for referral?

## 2013-03-27 NOTE — Telephone Encounter (Signed)
Pt needs another referral to dr Mina Marble [phone] for back pain and injections. Pt has [employer].

## 2013-03-31 MED ORDER — HYDROCODONE-ACETAMINOPHEN 10-325 MG PO TABS: 1.0000 | ORAL_TABLET | Freq: Four times a day (QID) | ORAL | Status: DC | PRN

## 2013-03-31 NOTE — Telephone Encounter (Signed)
Spoke to pt's husband Jeneen Rinks told him Rx is ready for pickup will be at the front desk. Jeneen Rinks verbalized understanding and will let pt know. Rx printed and signed.

## 2013-06-24 ENCOUNTER — Other Ambulatory Visit: Payer: Self-pay | Admitting: Internal Medicine

## 2013-06-26 ENCOUNTER — Telehealth: Payer: Self-pay | Admitting: Internal Medicine

## 2013-06-26 MED ORDER — HYDROCODONE-ACETAMINOPHEN 10-325 MG PO TABS: 1.0000 | ORAL_TABLET | Freq: Four times a day (QID) | ORAL | Status: DC | PRN

## 2013-06-26 NOTE — Telephone Encounter (Signed)
Pt notified Rx ready for pickup. Rx printed and signed.  

## 2013-06-26 NOTE — Telephone Encounter (Signed)
Pt request refill of HYDROcodone-acetaminophen (NORCO) 10-325 MG per tablet Pt has appt on 4/24 at 10pm

## 2013-07-07 ENCOUNTER — Other Ambulatory Visit: Payer: Self-pay | Admitting: Internal Medicine

## 2013-07-07 ENCOUNTER — Encounter: Payer: Self-pay | Admitting: Internal Medicine

## 2013-07-07 ENCOUNTER — Ambulatory Visit (INDEPENDENT_AMBULATORY_CARE_PROVIDER_SITE_OTHER): Payer: Medicare HMO | Admitting: Internal Medicine

## 2013-07-07 VITALS — BP 160/84 | HR 63 | Temp 98.6°F | Resp 20 | Ht 65.5 in | Wt 219.0 lb

## 2013-07-07 DIAGNOSIS — E785 Hyperlipidemia, unspecified: Secondary | ICD-10-CM

## 2013-07-07 DIAGNOSIS — R350 Frequency of micturition: Secondary | ICD-10-CM

## 2013-07-07 DIAGNOSIS — I10 Essential (primary) hypertension: Secondary | ICD-10-CM

## 2013-07-07 DIAGNOSIS — IMO0002 Reserved for concepts with insufficient information to code with codable children: Secondary | ICD-10-CM

## 2013-07-07 DIAGNOSIS — Z86010 Personal history of colonic polyps: Secondary | ICD-10-CM

## 2013-07-07 LAB — POCT URINALYSIS DIPSTICK
BILIRUBIN UA: NEGATIVE
Blood, UA: NEGATIVE
Glucose, UA: NEGATIVE
KETONES UA: NEGATIVE
Nitrite, UA: NEGATIVE
PH UA: 7.5
SPEC GRAV UA: 1.01
Urobilinogen, UA: 0.2

## 2013-07-07 MED ORDER — ALPRAZOLAM 0.25 MG PO TABS: 0.2500 mg | ORAL_TABLET | Freq: Three times a day (TID) | ORAL | Status: DC | PRN

## 2013-07-07 NOTE — Patient Instructions (Signed)
Limit your sodium (Salt) intake    It is important that you exercise regularly, at least 20 minutes 3 to 4 times per week.  If you develop chest pain or shortness of breath seek  medical attention.  You need to lose weight.  Consider a lower calorie diet and regular exercise.  Avoids foods high in acid such as tomatoes citrus juices, and spicy foods.  Avoid eating within two hours of lying down or before exercising.  Do not overheat.  Try smaller more frequent meals.  If symptoms persist, elevate the head of her bed 12 inches while sleeping.  Please check your blood pressure on a regular basis.  If it is consistently greater than 150/90, please make an office appointment.  Return in 6 months for follow-up

## 2013-07-07 NOTE — Progress Notes (Signed)
Subjective:    Patient ID: Miranda [name], female    DOB: [birth year], 68 y.o.   MRN: [id number]  HPI 68 year old patient who has multiple drug allergies.  She presents with a 2 week history of urgency and dysuria.  She has delayed the office visit due to the fear of side effects of antibiotics. She has treated hypertension and has been compliant with her medications. She also has a history of dyslipidemia.  Unclear whether she is taking lovastatin.  Compliance stressed She has had some increasing situational stress due to the poor health of a sister, who is terminal with advanced colon cancer. She has chronic constipation. She also has a history of asthma, which has been stable.  Past Medical History  Diagnosis Date  . GERD (gastroesophageal reflux disease)   . Hyperlipidemia   . Low back pain   . Asthma   . Colon polyps   . Hemorrhoids   . Complication of anesthesia     states requires a lot med. to put her to sleep   . Family history of anesthesia complication   . CHF (congestive heart failure)     pt. unsure- but thinks she was hosp. for CHF- 2002  . [facility] disease     [facility]  . Anxiety   . Depression     "sometimes "  . Shortness of breath   . Sleep apnea     uses c-pap- q night recently  . DDD (degenerative disc disease) 09/17/2011  . Glaucoma     bilateral, pt. admits that she is noncompliant to eye gtts.   . Hypertension     had stress, echo- 2006 /w [place], Cardiac Cath, per pt. 2002, echo repeated 2012- wnl     History   Social History  . Marital Status: Married    Spouse Name: N/A    Number of Children: N/A  . Years of Education: N/A   Occupational History  . retired Quarry manager    Social History Main Topics  . Smoking status: Never Smoker   . Smokeless tobacco: Never Used  . Alcohol Use: No  . Drug Use: No  . Sexual Activity: No   Other Topics Concern  . Not on file   Social History Narrative  . No narrative on file    Past  Surgical History  Procedure Laterality Date  . Cardiac catheterization    . Colonoscopy      remote  . Abdominal hysterectomy      ectopic, fibroids  . Foot surgery      bilat, heel spurs- screw in R foot   . Flexible sigmoidoscopy  02/25/2011    Procedure: FLEXIBLE SIGMOIDOSCOPY;  Surgeon: Inda Castle, MD;  Location: [facility];  Service: Endoscopy;  Laterality: N/A;  . Eye surgery      cataracts removed bilateral- ?IOL  . Sphincterotomy  10/08/2011    Procedure: SPHINCTEROTOMY;  Surgeon: Stark Klein, MD;  Location: [place];  Service: General;  Laterality: N/A;  . Fissurectomy  10/08/2011    Procedure: FISSURECTOMY;  Surgeon: Stark Klein, MD;  Location: [place];  Service: General;  Laterality: N/A;  . Hemorrhoid surgery  10/08/2011    Procedure: HEMORRHOIDECTOMY;  Surgeon: Stark Klein, MD;  Location: [place];  Service: General;  Laterality: N/A;  External     Family History  Problem Relation Age of Onset  . Cancer Mother     breast  . Heart attack Mother   . Heart  disease Mother   . Cancer Other     breast, colon, lung  . Heart disease Other   . Anesthesia problems Neg Hx   . Emphysema Sister   . Cancer Sister     breast  . Asthma Sister   . Heart disease Sister   . Rheum arthritis Sister   . Cancer Brother     colon    Allergies  Allergen Reactions  . Fish Oil Anaphylaxis  . Penicillins Anaphylaxis  . Aspirin Itching and Rash  . Bactrim [Sulfamethoxazole-Trimethoprim] Hives, Itching and Rash  . Ciprofloxacin Hives, Itching and Rash  . Ibuprofen Rash    REACTION: rash  . Influenza Vaccines Hives  . Ivp Dye [Iodinated Diagnostic Agents] Hives, Itching and Rash    Gives benadryl to counteract symptoms  . Latex Rash  . Macrobid [Nitrofurantoin Monohydrate Macrocrystals] Hives  . Shellfish Allergy Hives    Patient also allergic to seafood    Current Outpatient Prescriptions on File Prior to Visit  Medication Sig Dispense Refill  . albuterol (PROVENTIL  HFA;VENTOLIN HFA) 108 (90 BASE) MCG/ACT inhaler Inhale 2 puffs into the lungs every 6 (six) hours as needed.  1 Inhaler  4  . ALPRAZolam (XANAX) 0.25 MG tablet Take 1 tablet (0.25 mg total) by mouth 3 (three) times daily as needed for sleep.  60 tablet  2  . amLODipine (NORVASC) 5 MG tablet TAKE ONE TABLET BY MOUTH ONCE DAILY  90 tablet  1  . benazepril-hydrochlorthiazide (LOTENSIN HCT) 20-12.5 MG per tablet Take 2 tablets by mouth daily with breakfast.  180 tablet  4  . bisacodyl (DULCOLAX) 5 MG EC tablet Take 20 mg by mouth daily as needed. For severe constipation.      . cyclobenzaprine (FLEXERIL) 10 MG tablet Take 10 mg by mouth 3 (three) times daily as needed for muscle spasms.      . Fluticasone-Salmeterol (ADVAIR) 250-50 MCG/DOSE AEPB Inhale 1 puff into the lungs every 12 (twelve) hours.  60 each  6  . furosemide (LASIX) 40 MG tablet Take 1 tablet (40 mg total) by mouth daily.  90 tablet  4  . HYDROcodone-acetaminophen (NORCO) 10-325 MG per tablet Take 1 tablet by mouth every 6 (six) hours as needed.  90 tablet  0  . lactulose (CHRONULAC) 10 GM/15ML solution Take 30 mLs (20 g total) by mouth 3 (three) times daily.  500 mL  2  . lovastatin (MEVACOR) 40 MG tablet Take 1 tablet (40 mg total) by mouth at bedtime.  90 tablet  6  . metoprolol (LOPRESSOR) 50 MG tablet Take 1 tablet (50 mg total) by mouth 2 (two) times daily.  180 tablet  4  . naproxen sodium (ALEVE) 220 MG tablet Take 660 mg by mouth daily as needed (pain).       Marland Kitchen omeprazole (PRILOSEC) 20 MG capsule Take 2 capsules (40 mg total) by mouth daily.  30 capsule  6   No current facility-administered medications on file prior to visit.    BP 160/84  Pulse 63  Temp(Src) 98.6 F (37 C) (Oral)  Resp 20  Ht 5' 5.5" (1.664 m)  Wt 219 lb (99.338 kg)  BMI 35.88 kg/m2  SpO2 97%      Review of Systems  Constitutional: Negative.   HENT: Negative for congestion, dental problem, hearing loss, rhinorrhea, sinus pressure, sore throat  and tinnitus.   Eyes: Negative for pain, discharge and visual disturbance.  Respiratory: Negative for cough and shortness of breath.  Cardiovascular: Negative for chest pain, palpitations and leg swelling.  Gastrointestinal: Positive for constipation. Negative for nausea, vomiting, abdominal pain, diarrhea, blood in stool and abdominal distention.  Genitourinary: Positive for dysuria and urgency. Negative for frequency, hematuria, flank pain, vaginal bleeding, vaginal discharge, difficulty urinating, vaginal pain and pelvic pain.  Musculoskeletal: Negative for arthralgias, gait problem and joint swelling.  Skin: Negative for rash.  Neurological: Negative for dizziness, syncope, speech difficulty, weakness, numbness and headaches.  Hematological: Negative for adenopathy.  Psychiatric/Behavioral: Negative for behavioral problems, dysphoric mood and agitation. The patient is nervous/anxious.        Objective:   Physical Exam  Constitutional: She is oriented to person, place, and time. She appears well-developed and well-nourished.  Blood pressure 150/80  HENT:  Head: Normocephalic.  [name] Ear: External ear normal.  Left Ear: External ear normal.  Mouth/Throat: Oropharynx is clear and moist.  Eyes: Conjunctivae and EOM are normal. Pupils are equal, round, and reactive to light.  Neck: Normal range of motion. Neck supple. No thyromegaly present.  Cardiovascular: Normal rate, regular rhythm, normal heart sounds and intact distal pulses.   Pulmonary/Chest: Effort normal and breath sounds normal.  Abdominal: Soft. Bowel sounds are normal. She exhibits no mass. There is no tenderness.  Musculoskeletal: Normal range of motion.  Lymphadenopathy:    She has no cervical adenopathy.  Neurological: She is alert and oriented to person, place, and time.  Skin: Skin is warm and dry. No rash noted.  Psychiatric: She has a normal mood and affect. Her behavior is normal.          Assessment &  Plan:   Rule out UTI.  Urinalysis reveals mild pyuria.  We'll check a urine culture Hypertension Dyslipidemia.  Continue lovastatin.  Compliance stressed Asthma stable Chronic constipation Gastroesophageal reflux disease, osteoarthritis  Schedule CPX

## 2013-07-07 NOTE — Progress Notes (Signed)
Pre-visit discussion using our clinic review tool. No additional management support is needed unless otherwise documented below in the visit note.  

## 2013-07-09 LAB — URINE CULTURE
Colony Count: NO GROWTH
Organism ID, Bacteria: NO GROWTH

## 2013-07-10 ENCOUNTER — Telehealth: Payer: Self-pay | Admitting: Internal Medicine

## 2013-07-10 NOTE — Telephone Encounter (Signed)
Relevant patient education mailed to patient.  

## 2013-08-21 ENCOUNTER — Other Ambulatory Visit: Payer: Self-pay | Admitting: Internal Medicine

## 2013-09-11 ENCOUNTER — Encounter: Payer: Self-pay | Admitting: Internal Medicine

## 2013-09-11 ENCOUNTER — Ambulatory Visit (INDEPENDENT_AMBULATORY_CARE_PROVIDER_SITE_OTHER): Payer: Commercial Managed Care - HMO | Admitting: Internal Medicine

## 2013-09-11 VITALS — BP 160/80 | HR 85 | Temp 98.5°F | Resp 20 | Ht 65.5 in | Wt 214.0 lb

## 2013-09-11 DIAGNOSIS — M25511 Pain in right shoulder: Secondary | ICD-10-CM

## 2013-09-11 DIAGNOSIS — E785 Hyperlipidemia, unspecified: Secondary | ICD-10-CM

## 2013-09-11 DIAGNOSIS — I10 Essential (primary) hypertension: Secondary | ICD-10-CM

## 2013-09-11 DIAGNOSIS — M25519 Pain in unspecified shoulder: Secondary | ICD-10-CM

## 2013-09-11 MED ORDER — MELOXICAM 15 MG PO TABS: 15.0000 mg | ORAL_TABLET | Freq: Every day | ORAL | Status: DC

## 2013-09-11 NOTE — Patient Instructions (Addendum)
Orthopedic followup as discussed Meloxicam 15  mg dailyRotator Cuff Injury Rotator cuff injury is any type of injury to the set of muscles and tendons that make up the stabilizing unit of your shoulder. This unit holds the ball of your upper arm bone (humerus) in the socket of your shoulder blade (scapula).  CAUSES Injuries to your rotator cuff most commonly come from sports or activities that cause your arm to be moved repeatedly over your head. Examples of this include throwing, weight lifting, swimming, or racquet sports. Long lasting (chronic) irritation of your rotator cuff can cause soreness and swelling (inflammation), bursitis, and eventual damage to your tendons, such as a tear (rupture). SIGNS AND SYMPTOMS Acute rotator cuff tear:  Sudden tearing sensation followed by severe pain shooting from your upper shoulder down your arm toward your elbow.  Decreased range of motion of your shoulder because of pain and muscle spasm.  Severe pain.  Inability to raise your arm out to the side because of pain and loss of muscle power (large tears). Chronic rotator cuff tear:  Pain that usually is worse at night and may interfere with sleep.  Gradual weakness and decreased shoulder motion as the pain worsens.  Decreased range of motion. Rotator cuff tendinitis:  Deep ache in your shoulder and the outside upper arm over your shoulder.  Pain that comes on gradually and becomes worse when lifting your arm to the side or turning it inward. DIAGNOSIS Rotator cuff injury is diagnosed through a medical history, physical exam, and imaging exam. The medical history helps determine the type of rotator cuff injury. Your health care Hydie Langan will look at your injured shoulder, feel the injured area, and ask you to move your shoulder in different positions. X-ray exams typically are done to rule out other causes of shoulder pain, such as fractures. MRI is the exam of choice for the most severe shoulder  injuries because the images show muscles and tendons.  TREATMENT  Chronic tear:  Medicine for pain, such as acetaminophen or ibuprofen.  Physical therapy and range-of-motion exercises may be helpful in maintaining shoulder function and strength.  Steroid injections into your shoulder joint.  Surgical repair of the rotator cuff if the injury does not heal with noninvasive treatment. Acute tear:  Anti-inflammatory medicines such as ibuprofen and naproxen to help reduce pain and swelling.  A sling to help support your arm and rest your rotator cuff muscles. Long-term use of a sling is not advised. It may cause significant stiffening of the shoulder joint.  Surgery may be considered within a few weeks, especially in younger, active people, to return the shoulder to full function.  Indications for surgical treatment include the following:  Age younger than 49 years.  Rotator cuff tears that are complete.  Physical therapy, rest, and anti-inflammatory medicines have been used for 6-8 weeks, with no improvement.  Employment or sporting activity that requires constant shoulder use. Tendinitis:  Anti-inflammatory medicines such as ibuprofen and naproxen to help reduce pain and swelling.  A sling to help support your arm and rest your rotator cuff muscles. Long-term use of a sling is not advised. It may cause significant stiffening of the shoulder joint.  Severe tendinitis may require:  Steroid injections into your shoulder joint.  Physical therapy.  Surgery. HOME CARE INSTRUCTIONS   Apply ice to your injury:  Put ice in a plastic bag.  Place a towel between your skin and the bag.  Leave the ice on for 20 minutes,  2-3 times a day.  If you have a shoulder immobilizer (sling and straps), wear it until told otherwise by your health care Mikaia Janvier.  You may want to sleep on several pillows or in a recliner at night to lessen swelling and pain.  Only take over-the-counter or  prescription medicines for pain, discomfort, or fever as directed by your health care Carvin Almas.  Do simple hand squeezing exercises with a soft rubber ball to decrease hand swelling. SEEK MEDICAL CARE IF:   Your shoulder pain increases, or new pain or numbness develops in your arm, hand, or fingers.  Your hand or fingers are colder than your other hand. SEEK IMMEDIATE MEDICAL CARE IF:   Your arm, hand, or fingers are numb or tingling.  Your arm, hand, or fingers are increasingly swollen and painful, or they turn white or blue. MAKE SURE YOU:  Understand these instructions.  Will watch your condition.  Will get help right away if you are not doing well or get worse. Document Released: 02/28/2000 Document Revised: 03/07/2013 Document Reviewed: 10/12/2012 [facility] Patient Information 2015 [place], [place]. This information is not intended to replace advice given to you by your health care Marysol Wellnitz. Make sure you discuss any questions you have with your health care Marjon Doxtater.   Orthopedic followup as discussed

## 2013-09-11 NOTE — Progress Notes (Signed)
Pre-visit discussion using our clinic review tool. No additional management support is needed unless otherwise documented below in the visit note.  

## 2013-09-11 NOTE — Progress Notes (Signed)
Subjective:    Patient ID: Miranda Miranda [name], female    DOB: [birth year], 68 y.o.   MRN: [id number]  HPI  68 year old patient who has a long history of intermittent Miranda [name] shoulder pain.  This has been more bothersome over the past 2 or 3 months and especially the past 2 weeks.  Miranda [name] arm is painful with movement. No history of recent trauma.  She has seen [facility] in the past  Past Medical History  Diagnosis Date  . GERD (gastroesophageal reflux disease)   . Hyperlipidemia   . Low back pain   . Asthma   . Colon polyps   . Hemorrhoids   . Complication of anesthesia     states requires a lot med. to put her to sleep   . Family history of anesthesia complication   . CHF (congestive heart failure)     pt. unsure- but thinks she was hosp. for CHF- 2002  . [facility] kidney disease     [facility]  . Anxiety   . Depression     "sometimes "  . Shortness of breath   . Sleep apnea     uses c-pap- q night recently  . DDD (degenerative disc disease) 09/17/2011  . Glaucoma     bilateral, pt. admits that she is noncompliant to eye gtts.   . Hypertension     had stress, echo- 2006 /w [place], Cardiac Cath, per pt. 2002, echo repeated 2012- wnl     History   Social History  . Marital Status: Married    Spouse Name: N/A    Number of Children: N/A  . Years of Education: N/A   Occupational History  . retired [employer]ar[employer] manager    Social History Main Topics  . Smoking status: Never Smoker   . Smokeless tobacco: Never Used  . Alcohol Use: No  . Drug Use: No  . Sexual Activity: No   Other Topics Concern  . Not on file   Social History Narrative  . No narrative on file    Past Surgical History  Procedure Laterality Date  . Cardiac catheterization    . Colonoscopy      remote  . Abdominal hysterectomy      ectopic, fibroids  . Foot surgery      bilat, heel spurs- screw in R foot   . Flexible sigmoidoscopy  02/25/2011    Procedure: FLEXIBLE SIGMOIDOSCOPY;  Surgeon:  Inda Castle, MD;  Location: [facility];  Service: Endoscopy;  Laterality: N/A;  . Eye surgery      cataracts removed bilateral- ?IOL  . Sphincterotomy  10/08/2011    Procedure: SPHINCTEROTOMY;  Surgeon: Stark Klein, MD;  Location: [place];  Service: General;  Laterality: N/A;  . Fissurectomy  10/08/2011    Procedure: FISSURECTOMY;  Surgeon: Stark Klein, MD;  Location: [place];  Service: General;  Laterality: N/A;  . Hemorrhoid surgery  10/08/2011    Procedure: HEMORRHOIDECTOMY;  Surgeon: Stark Klein, MD;  Location: [place];  Service: General;  Laterality: N/A;  External     Family History  Problem Relation Age of Onset  . Cancer Mother     breast  . Heart attack Mother   . Heart disease Mother   . Cancer Other     breast, colon, lung  . Heart disease Other   . Anesthesia problems Neg Hx   . Emphysema Sister   . Cancer Sister     breast  . Asthma Sister   .  Heart disease Sister   . Rheum arthritis Sister   . Cancer Brother     colon    Allergies  Allergen Reactions  . Fish Oil Anaphylaxis  . Penicillins Anaphylaxis  . Aspirin Itching and Rash  . Bactrim [Sulfamethoxazole-Trimethoprim] Hives, Itching and Rash  . Ciprofloxacin Hives, Itching and Rash  . Ibuprofen Rash    REACTION: rash  . Influenza Vaccines Hives  . Ivp Dye [Iodinated Diagnostic Agents] Hives, Itching and Rash    Gives benadryl to counteract symptoms  . Latex Rash  . Macrobid [Nitrofurantoin Monohydrate Macrocrystals] Hives  . Shellfish Allergy Hives    Patient also allergic to seafood    Current Outpatient Prescriptions on File Prior to Visit  Medication Sig Dispense Refill  . ALPRAZolam (XANAX) 0.25 MG tablet Take 1 tablet (0.25 mg total) by mouth 3 (three) times daily as needed for sleep.  60 tablet  2  . amLODipine (NORVASC) 5 MG tablet TAKE ONE TABLET BY MOUTH ONCE DAILY  90 tablet  1  . benazepril-hydrochlorthiazide (LOTENSIN HCT) 20-12.5 MG per tablet Take 2 tablets by mouth daily with  breakfast.  180 tablet  4  . bisacodyl (DULCOLAX) 5 MG EC tablet Take 20 mg by mouth daily as needed. For severe constipation.      . cyclobenzaprine (FLEXERIL) 10 MG tablet Take 10 mg by mouth 3 (three) times daily as needed for muscle spasms.      . Fluticasone-Salmeterol (ADVAIR) 250-50 MCG/DOSE AEPB Inhale 1 puff into the lungs every 12 (twelve) hours.  60 each  6  . furosemide (LASIX) 40 MG tablet TAKE ONE TABLET BY MOUTH ONCE DAILY  90 tablet  1  . HYDROcodone-acetaminophen (NORCO) 10-325 MG per tablet Take 1 tablet by mouth every 6 (six) hours as needed.  90 tablet  0  . lactulose (CHRONULAC) 10 GM/15ML solution Take 30 mLs (20 g total) by mouth 3 (three) times daily.  500 mL  2  . lovastatin (MEVACOR) 40 MG tablet TAKE TWO TABLETS BY MOUTH AT BEDTIME  180 tablet  0  . metoprolol (LOPRESSOR) 50 MG tablet TAKE ONE TABLET BY MOUTH TWICE DAILY  180 tablet  0  . omeprazole (PRILOSEC) 20 MG capsule TAKE TWO CAPSULES BY MOUTH ONCE DAILY  30 capsule  5  . timolol (BETIMOL) 0.25 % ophthalmic solution Place 1 drop into both eyes at bedtime.      Marland Kitchen albuterol (PROVENTIL HFA;VENTOLIN HFA) 108 (90 BASE) MCG/ACT inhaler Inhale 2 puffs into the lungs every 6 (six) hours as needed.  1 Inhaler  4   No current facility-administered medications on file prior to visit.    BP 160/80  Pulse 85  Temp(Src) 98.5 F (36.9 C) (Oral)  Resp 20  Ht 5' 5.5" (1.664 m)  Wt 214 lb (97.07 kg)  BMI 35.06 kg/m2  SpO2 98%     Review of Systems  Musculoskeletal:       Miranda [name] shoulder pain       Objective:   Physical Exam  Constitutional: She appears well-developed and well-nourished. No distress.  Musculoskeletal:  Mild tenderness over the shoulder anteriorly.  Painful arc test positive from 60-90.  Patient unable to abduct greater than 90 due to pain Unable to perform drop arm test.  Due to inability to hold arm at 90  External rotation resistance test and both internal and external rotation lag   Tests were positive          Assessment & Plan:  Miranda [name] rotator cuff tendinopathy.  Will refer back to orthopedics.  Will treat with Mobic and analgesics Hypertension stable

## 2013-09-12 ENCOUNTER — Telehealth: Payer: Self-pay | Admitting: Internal Medicine

## 2013-09-12 NOTE — Telephone Encounter (Signed)
[facility] needs referral for this 7/8 visit. They have not received the referral for pt. Fax: [phone]

## 2013-09-13 NOTE — Telephone Encounter (Signed)
DONE Authorization [id number] silver back  Covered Dates: 09/20/2013 - 03/23/2014

## 2013-09-25 ENCOUNTER — Other Ambulatory Visit: Payer: Self-pay | Admitting: Internal Medicine

## 2013-09-28 ENCOUNTER — Encounter: Payer: Self-pay | Admitting: Gastroenterology

## 2013-10-03 ENCOUNTER — Telehealth: Payer: Self-pay | Admitting: Internal Medicine

## 2013-10-03 MED ORDER — HYDROCODONE-ACETAMINOPHEN 10-325 MG PO TABS: 1.0000 | ORAL_TABLET | Freq: Four times a day (QID) | ORAL | Status: DC | PRN

## 2013-10-03 NOTE — Telephone Encounter (Signed)
Pt request new rx HYDROcodone-acetaminophen (NORCO) 10-325 MG per tablet

## 2013-10-03 NOTE — Telephone Encounter (Signed)
Left detailed message Rx ready for pickup. Rx printed and signed. 

## 2013-12-15 ENCOUNTER — Telehealth: Payer: Self-pay | Admitting: Internal Medicine

## 2013-12-15 NOTE — Telephone Encounter (Signed)
Pt request refill of the following: HYDROcodone-acetaminophen (NORCO) 10-325 MG per tablet ° ° °Phamacy: ° °

## 2013-12-18 MED ORDER — HYDROCODONE-ACETAMINOPHEN 10-325 MG PO TABS: 1.0000 | ORAL_TABLET | Freq: Four times a day (QID) | ORAL | Status: DC | PRN

## 2013-12-18 NOTE — Telephone Encounter (Signed)
Pt's husband notified Rx ready for pickup. Rx printed and signed. 

## 2014-02-23 ENCOUNTER — Telehealth: Payer: Self-pay | Admitting: Internal Medicine

## 2014-02-23 MED ORDER — HYDROCODONE-ACETAMINOPHEN 10-325 MG PO TABS: 1.0000 | ORAL_TABLET | Freq: Four times a day (QID) | ORAL | Status: DC | PRN

## 2014-02-23 NOTE — Telephone Encounter (Signed)
Pt request refill of the following: HYDROcodone-acetaminophen (NORCO) 10-325 MG per tablet   Phamacy: pick up

## 2014-02-23 NOTE — Telephone Encounter (Signed)
Pt notified Rx ready for pickup. Rx printed and signed by Dr. Burchette.  

## 2014-03-05 ENCOUNTER — Other Ambulatory Visit: Payer: Self-pay | Admitting: Internal Medicine

## 2014-03-20 DIAGNOSIS — M25611 Stiffness of right shoulder, not elsewhere classified: Secondary | ICD-10-CM | POA: Diagnosis not present

## 2014-03-20 DIAGNOSIS — M75111 Incomplete rotator cuff tear or rupture of right shoulder, not specified as traumatic: Secondary | ICD-10-CM | POA: Diagnosis not present

## 2014-03-20 DIAGNOSIS — M25511 Pain in right shoulder: Secondary | ICD-10-CM | POA: Diagnosis not present

## 2014-03-20 DIAGNOSIS — M6281 Muscle weakness (generalized): Secondary | ICD-10-CM | POA: Diagnosis not present

## 2014-03-22 DIAGNOSIS — M25611 Stiffness of right shoulder, not elsewhere classified: Secondary | ICD-10-CM | POA: Diagnosis not present

## 2014-03-22 DIAGNOSIS — M75111 Incomplete rotator cuff tear or rupture of right shoulder, not specified as traumatic: Secondary | ICD-10-CM | POA: Diagnosis not present

## 2014-03-22 DIAGNOSIS — M6281 Muscle weakness (generalized): Secondary | ICD-10-CM | POA: Diagnosis not present

## 2014-03-22 DIAGNOSIS — M25511 Pain in right shoulder: Secondary | ICD-10-CM | POA: Diagnosis not present

## 2014-03-27 DIAGNOSIS — M25511 Pain in right shoulder: Secondary | ICD-10-CM | POA: Diagnosis not present

## 2014-03-27 DIAGNOSIS — M75111 Incomplete rotator cuff tear or rupture of right shoulder, not specified as traumatic: Secondary | ICD-10-CM | POA: Diagnosis not present

## 2014-03-27 DIAGNOSIS — M6281 Muscle weakness (generalized): Secondary | ICD-10-CM | POA: Diagnosis not present

## 2014-03-27 DIAGNOSIS — M25611 Stiffness of right shoulder, not elsewhere classified: Secondary | ICD-10-CM | POA: Diagnosis not present

## 2014-03-28 DIAGNOSIS — H524 Presbyopia: Secondary | ICD-10-CM | POA: Diagnosis not present

## 2014-03-28 DIAGNOSIS — H4011X4 Primary open-angle glaucoma, indeterminate stage: Secondary | ICD-10-CM | POA: Diagnosis not present

## 2014-03-28 DIAGNOSIS — H2513 Age-related nuclear cataract, bilateral: Secondary | ICD-10-CM | POA: Diagnosis not present

## 2014-04-03 DIAGNOSIS — M6281 Muscle weakness (generalized): Secondary | ICD-10-CM | POA: Diagnosis not present

## 2014-04-03 DIAGNOSIS — M75111 Incomplete rotator cuff tear or rupture of right shoulder, not specified as traumatic: Secondary | ICD-10-CM | POA: Diagnosis not present

## 2014-04-03 DIAGNOSIS — M25611 Stiffness of right shoulder, not elsewhere classified: Secondary | ICD-10-CM | POA: Diagnosis not present

## 2014-04-03 DIAGNOSIS — M25511 Pain in right shoulder: Secondary | ICD-10-CM | POA: Diagnosis not present

## 2014-04-12 DIAGNOSIS — M25611 Stiffness of right shoulder, not elsewhere classified: Secondary | ICD-10-CM | POA: Diagnosis not present

## 2014-04-12 DIAGNOSIS — M75111 Incomplete rotator cuff tear or rupture of right shoulder, not specified as traumatic: Secondary | ICD-10-CM | POA: Diagnosis not present

## 2014-04-12 DIAGNOSIS — M6281 Muscle weakness (generalized): Secondary | ICD-10-CM | POA: Diagnosis not present

## 2014-04-12 DIAGNOSIS — M25511 Pain in right shoulder: Secondary | ICD-10-CM | POA: Diagnosis not present

## 2014-04-19 DIAGNOSIS — M25611 Stiffness of right shoulder, not elsewhere classified: Secondary | ICD-10-CM | POA: Diagnosis not present

## 2014-04-19 DIAGNOSIS — M6281 Muscle weakness (generalized): Secondary | ICD-10-CM | POA: Diagnosis not present

## 2014-04-19 DIAGNOSIS — M75111 Incomplete rotator cuff tear or rupture of right shoulder, not specified as traumatic: Secondary | ICD-10-CM | POA: Diagnosis not present

## 2014-04-19 DIAGNOSIS — M25511 Pain in right shoulder: Secondary | ICD-10-CM | POA: Diagnosis not present

## 2014-04-20 DIAGNOSIS — Z09 Encounter for follow-up examination after completed treatment for conditions other than malignant neoplasm: Secondary | ICD-10-CM | POA: Diagnosis not present

## 2014-05-03 DIAGNOSIS — M25511 Pain in right shoulder: Secondary | ICD-10-CM | POA: Diagnosis not present

## 2014-05-03 DIAGNOSIS — M25611 Stiffness of right shoulder, not elsewhere classified: Secondary | ICD-10-CM | POA: Diagnosis not present

## 2014-05-03 DIAGNOSIS — M6281 Muscle weakness (generalized): Secondary | ICD-10-CM | POA: Diagnosis not present

## 2014-05-03 DIAGNOSIS — M75111 Incomplete rotator cuff tear or rupture of right shoulder, not specified as traumatic: Secondary | ICD-10-CM | POA: Diagnosis not present

## 2014-05-09 DIAGNOSIS — M6281 Muscle weakness (generalized): Secondary | ICD-10-CM | POA: Diagnosis not present

## 2014-05-09 DIAGNOSIS — M75111 Incomplete rotator cuff tear or rupture of right shoulder, not specified as traumatic: Secondary | ICD-10-CM | POA: Diagnosis not present

## 2014-05-09 DIAGNOSIS — M25611 Stiffness of right shoulder, not elsewhere classified: Secondary | ICD-10-CM | POA: Diagnosis not present

## 2014-05-09 DIAGNOSIS — M25511 Pain in right shoulder: Secondary | ICD-10-CM | POA: Diagnosis not present

## 2014-05-16 DIAGNOSIS — M6281 Muscle weakness (generalized): Secondary | ICD-10-CM | POA: Diagnosis not present

## 2014-05-16 DIAGNOSIS — M25511 Pain in right shoulder: Secondary | ICD-10-CM | POA: Diagnosis not present

## 2014-05-16 DIAGNOSIS — M75111 Incomplete rotator cuff tear or rupture of right shoulder, not specified as traumatic: Secondary | ICD-10-CM | POA: Diagnosis not present

## 2014-05-16 DIAGNOSIS — M25611 Stiffness of right shoulder, not elsewhere classified: Secondary | ICD-10-CM | POA: Diagnosis not present

## 2014-05-21 ENCOUNTER — Telehealth: Payer: Self-pay | Admitting: Internal Medicine

## 2014-05-21 MED ORDER — HYDROCODONE-ACETAMINOPHEN 10-325 MG PO TABS: 1.0000 | ORAL_TABLET | Freq: Four times a day (QID) | ORAL | Status: DC | PRN

## 2014-05-21 NOTE — Telephone Encounter (Signed)
Pt request refill of the following: HYDROcodone-acetaminophen (NORCO) 10-325 MG per tablet ° ° °Phamacy: ° °

## 2014-05-21 NOTE — Telephone Encounter (Signed)
Pt notified Rx ready for pickup. Rx printed and signed.  

## 2014-05-23 DIAGNOSIS — M6281 Muscle weakness (generalized): Secondary | ICD-10-CM | POA: Diagnosis not present

## 2014-05-23 DIAGNOSIS — M25511 Pain in right shoulder: Secondary | ICD-10-CM | POA: Diagnosis not present

## 2014-05-23 DIAGNOSIS — M75111 Incomplete rotator cuff tear or rupture of right shoulder, not specified as traumatic: Secondary | ICD-10-CM | POA: Diagnosis not present

## 2014-05-23 DIAGNOSIS — M25611 Stiffness of right shoulder, not elsewhere classified: Secondary | ICD-10-CM | POA: Diagnosis not present

## 2014-05-30 DIAGNOSIS — H4011X4 Primary open-angle glaucoma, indeterminate stage: Secondary | ICD-10-CM | POA: Diagnosis not present

## 2014-05-30 DIAGNOSIS — H25813 Combined forms of age-related cataract, bilateral: Secondary | ICD-10-CM | POA: Insufficient documentation

## 2014-05-31 DIAGNOSIS — M25511 Pain in right shoulder: Secondary | ICD-10-CM | POA: Diagnosis not present

## 2014-05-31 DIAGNOSIS — M25611 Stiffness of right shoulder, not elsewhere classified: Secondary | ICD-10-CM | POA: Diagnosis not present

## 2014-05-31 DIAGNOSIS — M6281 Muscle weakness (generalized): Secondary | ICD-10-CM | POA: Diagnosis not present

## 2014-05-31 DIAGNOSIS — M75111 Incomplete rotator cuff tear or rupture of right shoulder, not specified as traumatic: Secondary | ICD-10-CM | POA: Diagnosis not present

## 2014-06-01 ENCOUNTER — Encounter: Payer: Self-pay | Admitting: *Deleted

## 2014-06-01 ENCOUNTER — Encounter: Payer: Self-pay | Admitting: Internal Medicine

## 2014-06-01 ENCOUNTER — Ambulatory Visit (INDEPENDENT_AMBULATORY_CARE_PROVIDER_SITE_OTHER): Payer: Medicare Other | Admitting: Internal Medicine

## 2014-06-01 VITALS — BP 140/84 | HR 63 | Temp 98.0°F | Resp 20 | Ht 64.0 in | Wt 213.0 lb

## 2014-06-01 DIAGNOSIS — E785 Hyperlipidemia, unspecified: Secondary | ICD-10-CM

## 2014-06-01 DIAGNOSIS — I10 Essential (primary) hypertension: Secondary | ICD-10-CM | POA: Diagnosis not present

## 2014-06-01 DIAGNOSIS — G4733 Obstructive sleep apnea (adult) (pediatric): Secondary | ICD-10-CM | POA: Diagnosis not present

## 2014-06-01 DIAGNOSIS — I209 Angina pectoris, unspecified: Secondary | ICD-10-CM

## 2014-06-01 DIAGNOSIS — Z0000 Encounter for general adult medical examination without abnormal findings: Secondary | ICD-10-CM | POA: Diagnosis not present

## 2014-06-01 DIAGNOSIS — Z86010 Personal history of colonic polyps: Secondary | ICD-10-CM

## 2014-06-01 LAB — CBC WITH DIFFERENTIAL/PLATELET
BASOS PCT: 0.3 % (ref 0.0–3.0)
Basophils Absolute: 0 10*3/uL (ref 0.0–0.1)
EOS ABS: 0.2 10*3/uL (ref 0.0–0.7)
EOS PCT: 3.7 % (ref 0.0–5.0)
HCT: 41.1 % (ref 36.0–46.0)
Hemoglobin: 13.7 g/dL (ref 12.0–15.0)
LYMPHS PCT: 41.2 % (ref 12.0–46.0)
Lymphs Abs: 2.6 10*3/uL (ref 0.7–4.0)
MCHC: 33.4 g/dL (ref 30.0–36.0)
MCV: 81.8 fl (ref 78.0–100.0)
MONO ABS: 0.6 10*3/uL (ref 0.1–1.0)
Monocytes Relative: 10.1 % (ref 3.0–12.0)
Neutro Abs: 2.8 10*3/uL (ref 1.4–7.7)
Neutrophils Relative %: 44.7 % (ref 43.0–77.0)
PLATELETS: 188 10*3/uL (ref 150.0–400.0)
RBC: 5.03 Mil/uL (ref 3.87–5.11)
RDW: 14.6 % (ref 11.5–15.5)
WBC: 6.3 10*3/uL (ref 4.0–10.5)

## 2014-06-01 LAB — COMPREHENSIVE METABOLIC PANEL
ALBUMIN: 4.4 g/dL (ref 3.5–5.2)
ALK PHOS: 51 U/L (ref 39–117)
ALT: 18 U/L (ref 0–35)
AST: 18 U/L (ref 0–37)
BILIRUBIN TOTAL: 0.5 mg/dL (ref 0.2–1.2)
BUN: 14 mg/dL (ref 6–23)
CO2: 30 mEq/L (ref 19–32)
Calcium: 10 mg/dL (ref 8.4–10.5)
Chloride: 106 mEq/L (ref 96–112)
Creatinine, Ser: 0.68 mg/dL (ref 0.40–1.20)
GFR: 110.77 mL/min (ref >60.00)
Glucose, Bld: 106 mg/dL — ABNORMAL HIGH (ref 70–99)
POTASSIUM: 4.3 meq/L (ref 3.5–5.1)
Sodium: 140 mEq/L (ref 135–145)
TOTAL PROTEIN: 8.2 g/dL (ref 6.0–8.3)

## 2014-06-01 LAB — LIPID PANEL
CHOL/HDL RATIO: 4
CHOLESTEROL: 195 mg/dL (ref 0–200)
HDL: 55.1 mg/dL (ref >39.00)
LDL Cholesterol: 123 mg/dL — ABNORMAL HIGH (ref 0–99)
NonHDL: 139.9
TRIGLYCERIDES: 84 mg/dL (ref 0.0–149.0)
VLDL: 16.8 mg/dL (ref 0.0–40.0)

## 2014-06-01 LAB — TSH: TSH: 0.86 u[IU]/mL (ref 0.35–4.50)

## 2014-06-01 MED ORDER — CYCLOBENZAPRINE HCL 10 MG PO TABS: 10.0000 mg | ORAL_TABLET | Freq: Three times a day (TID) | ORAL | Status: DC | PRN

## 2014-06-01 MED ORDER — BENAZEPRIL-HYDROCHLOROTHIAZIDE 20-12.5 MG PO TABS
ORAL_TABLET | ORAL | Status: DC
Start: 2014-06-01 — End: 2014-08-03

## 2014-06-01 MED ORDER — ESCITALOPRAM OXALATE 5 MG PO TABS: 5.0000 mg | ORAL_TABLET | Freq: Every day | ORAL | Status: DC

## 2014-06-01 MED ORDER — AMLODIPINE BESYLATE 5 MG PO TABS: 5.0000 mg | ORAL_TABLET | Freq: Every day | ORAL | Status: DC

## 2014-06-01 MED ORDER — FUROSEMIDE 40 MG PO TABS: 40.0000 mg | ORAL_TABLET | Freq: Every day | ORAL | Status: DC

## 2014-06-01 MED ORDER — ALPRAZOLAM 0.25 MG PO TABS: 0.2500 mg | ORAL_TABLET | Freq: Three times a day (TID) | ORAL | Status: DC | PRN

## 2014-06-01 MED ORDER — OMEPRAZOLE 20 MG PO CPDR: DELAYED_RELEASE_CAPSULE | ORAL | Status: DC

## 2014-06-01 MED ORDER — METOPROLOL TARTRATE 50 MG PO TABS: 50.0000 mg | ORAL_TABLET | Freq: Two times a day (BID) | ORAL | Status: DC

## 2014-06-01 MED ORDER — ALBUTEROL SULFATE HFA 108 (90 BASE) MCG/ACT IN AERS: 2.0000 | INHALATION_SPRAY | Freq: Four times a day (QID) | RESPIRATORY_TRACT | Status: DC | PRN

## 2014-06-01 MED ORDER — LOVASTATIN 40 MG PO TABS: 80.0000 mg | ORAL_TABLET | Freq: Every day | ORAL | Status: DC

## 2014-06-01 NOTE — Progress Notes (Signed)
Subjective:    Patient ID: Miranda [name], female    DOB: August 19, 1945, 69 y.o.   MRN: [id number]  HPI   Subjective:    Patient ID: Miranda [name], female    DOB: [birth year], 69 y.o.   MRN: [id number]   69 year old patient who is seen today for a comprehensive annual examination.  Medical problems include a history of asthma that has been quite stable.  She has treated hypertension and history of osteoarthritis and dyslipidemia.  Remains on lovastatin. She has had considerable situational stress.  A daughter has had a recent MI approximately 4 weeks ago.  The patient has been quite anxious and depressed. The patient is followed closely for glaucoma at [facility]. Patient complains of exertional dyspnea and chest tightness.  Past Medical History  Diagnosis Date  . GERD (gastroesophageal reflux disease)   . Hyperlipidemia   . Low back pain   . Asthma   . Colon polyps   . Hemorrhoids   . Complication of anesthesia     states requires a lot med. to put her to sleep   . Family history of anesthesia complication   . CHF (congestive heart failure)     pt. unsure- but thinks she was hosp. for CHF- 2002  . Chronic kidney disease     [facility]  . Anxiety   . Depression     "sometimes "  . Shortness of breath   . Sleep apnea     uses c-pap- q night recently  . DDD (degenerative disc disease) 09/17/2011  . Glaucoma     bilateral, pt. admits that she is noncompliant to eye gtts.   . Hypertension     had stress, echo- 2006 /w [place], Cardiac Cath, per pt. 2002, echo repeated 2012- wnl     History   Social History  . Marital Status: Married    Spouse Name: N/A  . Number of Children: N/A  . Years of Education: N/A   Occupational History  . retired [employer]arry manager    Social History Main Topics  . Smoking status: Never Smoker   . Smokeless tobacco: Never Used  . Alcohol Use: No  . Drug Use: No  . Sexual Activity: No   Other Topics Concern  . Not on file   Social  History Narrative    Past Surgical History  Procedure Laterality Date  . Cardiac catheterization    . Colonoscopy      remote  . Abdominal hysterectomy      ectopic, fibroids  . Foot surgery      bilat, heel spurs- screw in R foot   . Flexible sigmoidoscopy  02/25/2011    Procedure: FLEXIBLE SIGMOIDOSCOPY;  Surgeon: Inda Castle, MD;  Location: [facility];  Service: Endoscopy;  Laterality: N/A;  . Eye surgery      cataracts removed bilateral- ?IOL  . Sphincterotomy  10/08/2011    Procedure: SPHINCTEROTOMY;  Surgeon: Stark Klein, MD;  Location: [place];  Service: General;  Laterality: N/A;  . Fissurectomy  10/08/2011    Procedure: FISSURECTOMY;  Surgeon: Stark Klein, MD;  Location: [place];  Service: General;  Laterality: N/A;  . Hemorrhoid surgery  10/08/2011    Procedure: HEMORRHOIDECTOMY;  Surgeon: Stark Klein, MD;  Location: [facility];  Service: General;  Laterality: N/A;  External     Family History  Problem Relation Age of Onset  . Cancer Mother     breast  . Heart attack Mother   .  Heart disease Mother   . Cancer Other     breast, colon, lung  . Heart disease Other   . Anesthesia problems Neg Hx   . Emphysema Sister   . Cancer Sister     breast  . Asthma Sister   . Heart disease Sister   . Rheum arthritis Sister   . Cancer Brother     colon    Allergies  Allergen Reactions  . Fish Oil Anaphylaxis  . Penicillins Anaphylaxis  . Aspirin Itching and Rash  . Bactrim [Sulfamethoxazole-Trimethoprim] Hives, Itching and Rash  . Ciprofloxacin Hives, Itching and Rash  . Ibuprofen Rash    REACTION: rash  . Influenza Vaccines Hives  . Ivp Dye [Iodinated Diagnostic Agents] Hives, Itching and Rash    Gives benadryl to counteract symptoms  . Latex Rash  . Macrobid [Nitrofurantoin Monohydrate Macrocrystals] Hives  . Shellfish Allergy Hives    Patient also allergic to seafood    Current Outpatient Prescriptions on File Prior to Visit  Medication Sig Dispense Refill    . Fluticasone-Salmeterol (ADVAIR) 250-50 MCG/DOSE AEPB Inhale 1 puff into the lungs every 12 (twelve) hours. 60 each 6  . HYDROcodone-acetaminophen (NORCO) 10-325 MG per tablet Take 1 tablet by mouth every 6 (six) hours as needed. 90 tablet 0  . lactulose (CHRONULAC) 10 GM/15ML solution TAKE 30 MLS BY MOUTH 3 TMES DAILY 500 mL 1  . meloxicam (MOBIC) 15 MG tablet Take 1 tablet (15 mg total) by mouth daily. 60 tablet 2   No current facility-administered medications on file prior to visit.    BP 140/84 mmHg  Pulse 63  Temp(Src) 98 F (36.7 C) (Oral)  Resp 20  Ht 5\' 4"  (1.626 m)  Wt 213 lb (96.616 kg)  BMI 36.54 kg/m2  SpO2 99%   1. Risk factors, based on past  M,S,F history.  Cardiovascular risk factors include hypertension and dyslipidemia  2.  Physical activities: No exercise restrictions.  Patient gives a recent history of dyspnea on exertion with some exertional chest tightness  3.  Depression/mood: Patient has had some situational stressors.  Positive depression screen with loss of interest in daily activities.  She states that she cries quite easily  4.  Hearing: No deficits  5.  ADL's: Independent in all aspects of daily living  6.  Fall risk: Low  7.  Home safety: No problems identified  8.  Height weight, and visual acuity; height and weight stable followed closely by ophthalmology due to glaucoma  9.  Counseling: Heart healthy diet.  Encouraged  10. Lab orders based on risk factors: Laboratory profile including lipid panel will be reviewed  11. Referral : The patient is set up for nuclear stress test due to her recent history of exertional chest pain  12. Care plan: Evaluate for ischemic heart disease.  Aggressive risk factor modification  13. Cognitive assessment: Alert in order with normal affect.  No cognitive dysfunction  14. Screening: Blood annual clinical exams.  Is followed closely by ophthalmology.  We'll continue to have colonoscopies every 5 years.   Patient was provided with a written and personalized care plan.    annual mammograms.  Encouraged due to her strong family history of breast cancer  15. Provider List Update:  cardiology, primary care ophthalmology and orthopedics           Objective:           Assessment & Plan:    Review of Systems  Constitutional: Negative for  fever, appetite change, fatigue and unexpected weight change.  HENT: Negative for congestion, dental problem, ear pain, hearing loss, mouth sores, nosebleeds, sinus pressure, sore throat, tinnitus, trouble swallowing and voice change.   Eyes: Negative for photophobia, pain, redness and visual disturbance.  Respiratory: Positive for shortness of breath. Negative for cough and chest tightness.   Cardiovascular: Positive for chest pain. Negative for palpitations and leg swelling.  Gastrointestinal: Negative for nausea, vomiting, abdominal pain, diarrhea, constipation, blood in stool, abdominal distention and rectal pain.  Genitourinary: Negative for dysuria, urgency, frequency, hematuria, flank pain, vaginal bleeding, vaginal discharge, difficulty urinating, genital sores, vaginal pain, menstrual problem and pelvic pain.  Musculoskeletal: Negative for back pain, arthralgias and neck stiffness.  Skin: Negative for rash.  Neurological: Negative for dizziness, syncope, speech difficulty, weakness, light-headedness, numbness and headaches.  Hematological: Negative for adenopathy. Does not bruise/bleed easily.  Psychiatric/Behavioral: Positive for behavioral problems, confusion and dysphoric mood. Negative for suicidal ideas, self-injury and agitation. The patient is nervous/anxious.        Objective:   Physical Exam  Constitutional: She is oriented to person, place, and time. She appears well-developed and well-nourished.  HENT:  Head: Normocephalic and atraumatic.  [name] Ear: External ear normal.  Left Ear: External ear normal.  Mouth/Throat:  Oropharynx is clear and moist.  Eyes: Conjunctivae and EOM are normal.  Neck: Normal range of motion. Neck supple. No JVD present. No thyromegaly present.  Cardiovascular: Normal rate, regular rhythm and normal heart sounds.   No murmur heard. Dorsalis pedis pulses full.  Posterior tibial pulses not easily palpable  Pulmonary/Chest: Effort normal and breath sounds normal. She has no wheezes. She has no rales.  Abdominal: Soft. Bowel sounds are normal. She exhibits no distension and no mass. There is no tenderness. There is no rebound and no guarding.  Genitourinary: Vagina normal.  Musculoskeletal: Normal range of motion. She exhibits no edema or tenderness.  Neurological: She is alert and oriented to person, place, and time. She has normal reflexes. No cranial nerve deficit. She exhibits normal muscle tone. Coordination normal.  Skin: Skin is warm and dry. No rash noted.  Psychiatric: She has a normal mood and affect. Her behavior is normal.          Assessment & Plan:  Preventive health examination Strong family history of breast cancer.  Mammogram encouraged History colonic polyps.  Continue colonoscopies at five-year intervals Exertional chest pain.  Will schedule nuclear stress test Depression.  We'll place on Lexapro 5 mg.  Reason assess in 6 weeks Asthma, stable Hypertension, stable Dyslipidemia.  Continue statin therapy

## 2014-06-01 NOTE — Patient Instructions (Addendum)
Depression Depression is feeling sad, low, down in the dumps, blue, gloomy, or empty. In general, there are two kinds of depression:  Normal sadness or grief. This can happen after something upsetting. It often goes away on its own within 2 weeks. After losing a loved one (bereavement), normal sadness and grief may last longer than two weeks. It usually gets better with time.  Clinical depression. This kind lasts longer than normal sadness or grief. It keeps you from doing the things you normally do in life. It is often hard to function at home, work, or at school. It may affect your relationships with others. Treatment is often needed. GET HELP RIGHT AWAY IF:  You have thoughts about hurting yourself or others.  You lose touch with reality (psychotic symptoms). You may:  See or hear things that are not real.  Have untrue beliefs about your life or people around you.  Your medicine is giving you problems. MAKE SURE YOU:  Understand these instructions.  Will watch your condition.  Will get help right away if you are not doing well or get worse. Document Released: 04/04/2010 Document Revised: 07/17/2013 Document Reviewed: 07/02/2011 [facility] Patient Information 2015 [place], [place]. This information is not intended to replace advice given to you by your health care provider. Make sure you discuss any questions you have with your health care provider. Health Maintenance Adopting a healthy lifestyle and getting preventive care can go a long way to promote health and wellness. Talk with your health care provider about what schedule of regular examinations is right for you. This is a good chance for you to check in with your provider about disease prevention and staying healthy. In between checkups, there are plenty of things you can do on your own. Experts have done a lot of research about which lifestyle changes and preventive measures are most likely to keep you healthy. Ask your health care  provider for more information. WEIGHT AND DIET  Eat a healthy diet  Be sure to include plenty of vegetables, fruits, low-fat dairy products, and lean protein.  Do not eat a lot of foods high in solid fats, added sugars, or salt.  Get regular exercise. This is one of the most important things you can do for your health.  Most adults should exercise for at least 150 minutes each week. The exercise should increase your heart rate and make you sweat (moderate-intensity exercise).  Most adults should also do strengthening exercises at least twice a week. This is in addition to the moderate-intensity exercise.  Maintain a healthy weight  Body mass index (BMI) is a measurement that can be used to identify possible weight problems. It estimates body fat based on height and weight. Your health care provider can help determine your BMI and help you achieve or maintain a healthy weight.  For females 59 years of age and older:   A BMI below 18.5 is considered underweight.  A BMI of 18.5 to 24.9 is normal.  A BMI of 25 to 29.9 is considered overweight.  A BMI of 30 and above is considered obese.  Watch levels of cholesterol and blood lipids  You should start having your blood tested for lipids and cholesterol at 68 years of age, then have this test every 5 years.  You may need to have your cholesterol levels checked more often if:  Your lipid or cholesterol levels are high.  You are older than 68 years of age.  You are at high risk  for heart disease.  CANCER SCREENING   Lung Cancer  Lung cancer screening is recommended for adults 70-26 years old who are at high risk for lung cancer because of a history of smoking.  A yearly low-dose CT scan of the lungs is recommended for people who:  Currently smoke.  Have quit within the past 15 years.  Have at least a 30-pack-year history of smoking. A pack year is smoking an average of one pack of cigarettes a day for 1 year.  Yearly  screening should continue until it has been 15 years since you quit.  Yearly screening should stop if you develop a health problem that would prevent you from having lung cancer treatment.  Breast Cancer  Practice breast self-awareness. This means understanding how your breasts normally appear and feel.  It also means doing regular breast self-exams. Let your health care provider know about any changes, no matter how small.  If you are in your 20s or 30s, you should have a clinical breast exam (CBE) by a health care provider every 1-3 years as part of a regular health exam.  If you are 32 or older, have a CBE every year. Also consider having a breast X-ray (mammogram) every year.  If you have a family history of breast cancer, talk to your health care provider about genetic screening.  If you are at high risk for breast cancer, talk to your health care provider about having an MRI and a mammogram every year.  Breast cancer gene (BRCA) assessment is recommended for women who have family members with BRCA-related cancers. BRCA-related cancers include:  Breast.  Ovarian.  Tubal.  Peritoneal cancers.  Results of the assessment will determine the need for genetic counseling and BRCA1 and BRCA2 testing. Cervical Cancer Routine pelvic examinations to screen for cervical cancer are no longer recommended for nonpregnant women who are considered low risk for cancer of the pelvic organs (ovaries, uterus, and vagina) and who do not have symptoms. A pelvic examination may be necessary if you have symptoms including those associated with pelvic infections. Ask your health care provider if a screening pelvic exam is right for you.   The Pap test is the screening test for cervical cancer for women who are considered at risk.  If you had a hysterectomy for a problem that was not cancer or a condition that could lead to cancer, then you no longer need Pap tests.  If you are older than 65 years, and  you have had normal Pap tests for the past 10 years, you no longer need to have Pap tests.  If you have had past treatment for cervical cancer or a condition that could lead to cancer, you need Pap tests and screening for cancer for at least 20 years after your treatment.  If you no longer get a Pap test, assess your risk factors if they change (such as having a new sexual partner). This can affect whether you should start being screened again.  Some women have medical problems that increase their chance of getting cervical cancer. If this is the case for you, your health care provider may recommend more frequent screening and Pap tests.  The human papillomavirus (HPV) test is another test that may be used for cervical cancer screening. The HPV test looks for the virus that can cause cell changes in the cervix. The cells collected during the Pap test can be tested for HPV.  The HPV test can be used to screen women  68 years of age and older. Getting tested for HPV can extend the interval between normal Pap tests from three to five years.  An HPV test also should be used to screen women of any age who have unclear Pap test results.  After 68 years of age, women should have HPV testing as often as Pap tests.  Colorectal Cancer  This type of cancer can be detected and often prevented.  Routine colorectal cancer screening usually begins at 68 years of age and continues through 68 years of age.  Your health care provider may recommend screening at an earlier age if you have risk factors for colon cancer.  Your health care provider may also recommend using home test kits to check for hidden blood in the stool.  A small camera at the end of a tube can be used to examine your colon directly (sigmoidoscopy or colonoscopy). This is done to check for the earliest forms of colorectal cancer.  Routine screening usually begins at age 52.  Direct examination of the colon should be repeated every 5-10  years through 68 years of age. However, you may need to be screened more often if early forms of precancerous polyps or small growths are found. Skin Cancer  Check your skin from head to toe regularly.  Tell your health care provider about any new moles or changes in moles, especially if there is a change in a mole's shape or color.  Also tell your health care provider if you have a mole that is larger than the size of a pencil eraser.  Always use sunscreen. Apply sunscreen liberally and repeatedly throughout the day.  Protect yourself by wearing long sleeves, pants, a wide-brimmed hat, and sunglasses whenever you are outside. HEART DISEASE, DIABETES, AND HIGH BLOOD PRESSURE   Have your blood pressure checked at least every 1-2 years. High blood pressure causes heart disease and increases the risk of stroke.  If you are between 37 years and 84 years old, ask your health care provider if you should take aspirin to prevent strokes.  Have regular diabetes screenings. This involves taking a blood sample to check your fasting blood sugar level.  If you are at a normal weight and have a low risk for diabetes, have this test once every three years after 68 years of age.  If you are overweight and have a high risk for diabetes, consider being tested at a younger age or more often. PREVENTING INFECTION  Hepatitis B  If you have a higher risk for hepatitis B, you should be screened for this virus. You are considered at high risk for hepatitis B if:  You were born in a country where hepatitis B is common. Ask your health care provider which countries are considered high risk.  Your parents were born in a high-risk country, and you have not been immunized against hepatitis B (hepatitis B vaccine).  You have HIV or AIDS.  You use needles to inject street drugs.  You live with someone who has hepatitis B.  You have had sex with someone who has hepatitis B.  You get hemodialysis  treatment.  You take certain medicines for conditions, including cancer, organ transplantation, and autoimmune conditions. Hepatitis C  Blood testing is recommended for:  Everyone born from [birth year] through 1964.  Anyone with known risk factors for hepatitis C. Sexually transmitted infections (STIs)  You should be screened for sexually transmitted infections (STIs) including gonorrhea and chlamydia if:  You are sexually active and  are younger than 68 years of age.  You are older than 68 years of age and your health care provider tells you that you are at risk for this type of infection.  Your sexual activity has changed since you were last screened and you are at an increased risk for chlamydia or gonorrhea. Ask your health care provider if you are at risk.  If you do not have HIV, but are at risk, it may be recommended that you take a prescription medicine daily to prevent HIV infection. This is called pre-exposure prophylaxis (PrEP). You are considered at risk if:  You are sexually active and do not regularly use condoms or know the HIV status of your partner(s).  You take drugs by injection.  You are sexually active with a partner who has HIV. Talk with your health care provider about whether you are at high risk of being infected with HIV. If you choose to begin PrEP, you should first be tested for HIV. You should then be tested every 3 months for as long as you are taking PrEP.  PREGNANCY   If you are premenopausal and you may become pregnant, ask your health care provider about preconception counseling.  If you may become pregnant, take 400 to 800 micrograms (mcg) of folic acid every day.  If you want to prevent pregnancy, talk to your health care provider about birth control (contraception). OSTEOPOROSIS AND MENOPAUSE   Osteoporosis is a disease in which the bones lose minerals and strength with aging. This can result in serious bone fractures. Your risk for osteoporosis can  be identified using a bone density scan.  If you are 66 years of age or older, or if you are at risk for osteoporosis and fractures, ask your health care provider if you should be screened.  Ask your health care provider whether you should take a calcium or vitamin D supplement to lower your risk for osteoporosis.  Menopause may have certain physical symptoms and risks.  Hormone replacement therapy may reduce some of these symptoms and risks. Talk to your health care provider about whether hormone replacement therapy is right for you.  HOME CARE INSTRUCTIONS   Schedule regular health, dental, and eye exams.  Stay current with your immunizations.   Do not use any tobacco products including cigarettes, chewing tobacco, or electronic cigarettes.  If you are pregnant, do not drink alcohol.  If you are breastfeeding, limit how much and how often you drink alcohol.  Limit alcohol intake to no more than 1 drink per day for nonpregnant women. One drink equals 12 ounces of beer, 5 ounces of wine, or 1 ounces of hard liquor.  Do not use street drugs.  Do not share needles.  Ask your health care provider for help if you need support or information about quitting drugs.  Tell your health care provider if you often feel depressed.  Tell your health care provider if you have ever been abused or do not feel safe at home. Document Released: 09/15/2010 Document Revised: 07/17/2013 Document Reviewed: 02/01/2013 [facility] Patient Information 2015 [place], [place]. This information is not intended to replace advice given to you by your health care provider. Make sure you discuss any questions you have with your health care provider.   Schedule your mammogram. Nuclear stress test as discussed Recheck in 6 weeks

## 2014-06-01 NOTE — Progress Notes (Signed)
Pre visit review using our clinic review tool, if applicable. No additional management support is needed unless otherwise documented below in the visit note. 

## 2014-06-06 DIAGNOSIS — M6281 Muscle weakness (generalized): Secondary | ICD-10-CM | POA: Diagnosis not present

## 2014-06-06 DIAGNOSIS — M25511 Pain in right shoulder: Secondary | ICD-10-CM | POA: Diagnosis not present

## 2014-06-06 DIAGNOSIS — M25611 Stiffness of right shoulder, not elsewhere classified: Secondary | ICD-10-CM | POA: Diagnosis not present

## 2014-06-06 DIAGNOSIS — M75111 Incomplete rotator cuff tear or rupture of right shoulder, not specified as traumatic: Secondary | ICD-10-CM | POA: Diagnosis not present

## 2014-06-12 DIAGNOSIS — Z09 Encounter for follow-up examination after completed treatment for conditions other than malignant neoplasm: Secondary | ICD-10-CM | POA: Diagnosis not present

## 2014-06-13 ENCOUNTER — Ambulatory Visit (HOSPITAL_COMMUNITY): Payer: Medicare Other | Attending: Cardiovascular Disease | Admitting: Radiology

## 2014-06-13 DIAGNOSIS — I209 Angina pectoris, unspecified: Secondary | ICD-10-CM

## 2014-06-13 MED ORDER — REGADENOSON 0.4 MG/5ML IV SOLN
0.4000 mg | Freq: Once | INTRAVENOUS | Status: AC
  Administered 2014-06-13: 0.4 mg via INTRAVENOUS

## 2014-06-13 MED ORDER — TECHNETIUM TC 99M SESTAMIBI GENERIC - CARDIOLITE
11.0000 | Freq: Once | INTRAVENOUS | Status: AC | PRN
  Administered 2014-06-13: 11 via INTRAVENOUS

## 2014-06-13 MED ORDER — TECHNETIUM TC 99M SESTAMIBI GENERIC - CARDIOLITE
33.0000 | Freq: Once | INTRAVENOUS | Status: AC | PRN
  Administered 2014-06-13: 33 via INTRAVENOUS

## 2014-06-13 NOTE — Progress Notes (Signed)
[facility] [address] [place], [place] [address] [phone]    Cardiology Nuclear Med Study  Miranda [name] is a 69 y.o. female     MRN : [id number]     DOB: 13-Nov-1945  Procedure Date: 06/13/2014  Nuclear Med Background Indication for Stress Test:  Evaluation for Ischemia History:Previous Nuclear Study (Adenosine 05-11-14 PAC's defect EF:68% Cardiac Risk Factors: Hypertension  Symptoms:  Chest Pain and DOE   Nuclear Pre-Procedure Caffeine/Decaff Intake:  None> 12 hrs NPO After: 8:30pm   Lungs:  clear O2 Sat: 95% on room air. IV 0.9% NS with Angio Cath:  24g  IV Site: R Hand x 1, tolerated well IV Started by:  Irven [place], RN  Chest Size (in):  40 Cup Size: B  Height: 5\' 4"  (1.626 m)  Weight:  207 lb (93.895 kg)  BMI:  Body mass index is 35.51 kg/(m^2). Tech Comments:  No medications this am except Advair on arrival. Irven [place], Therapist, sports.    Nuclear Med Study 1 or 2 day study: 1 day  Stress Test Type:  Treadmill/Lexiscan  Reading MD: N/A  Order Authorizing Provider:  Bluford Kaufmann, MD  Resting Radionuclide: Technetium 24m Sestamibi  Resting Radionuclide Dose: 11.0 mCi   Stress Radionuclide:  Technetium 80m Sestamibi  Stress Radionuclide Dose: 33.0 mCi           Stress Protocol Rest HR: 74 Stress HR: 115  Rest BP: 138/90 Stress BP: 194/96  Exercise Time (min): 2:00 METS: n/a   Predicted Max HR: 153 bpm % Max HR: 48.37 bpm Rate Pressure Product: 14430   Dose of Adenosine (mg):  n/a Dose of Lexiscan: 0.4 mg  Dose of Atropine (mg): n/a Dose of Dobutamine: n/a mcg/kg/min (at max HR)  Stress Test Technologist: Ileene Hutchinson, EMT-P  Nuclear Technologist:  Earl Many, CNMT     Rest Procedure:  Myocardial perfusion imaging was performed at rest 45 minutes following the intravenous administration of Technetium 52m Sestamibi. Rest ECG: NSR - Normal EKG  Stress Procedure:  The patient received IV Lexiscan 0.4 mg over 15-seconds  with concurrent low level exercise and then Technetium 12m Sestamibi was injected at 30-seconds while the patient continued walking one more minute.  Quantitative spect images were obtained after a 45-minute delay. Stress ECG: No significant change from baseline ECG  QPS Raw Data Images:  Normal; no motion artifact; normal heart/lung ratio. Stress Images:  Normal, no perfusion defect. Rest Images:  Normal homogeneous uptake in all areas of the myocardium. Subtraction (SDS):  There is no evidence of scar or ischemia. Transient Ischemic Dilatation (Normal <1.22):  0.99 Lung/Heart Ratio (Normal <0.45):  0.29  Quantitative Gated Spect Images QGS EDV:  79 ml QGS ESV:  24 ml  Impression Exercise Capacity:  Lexiscan with no exercise. BP Response:  Hypertensive blood pressure response. Clinical Symptoms:  Headache/abdominal pain ECG Impression:  No significant ST segment change suggestive of ischemia. Comparison with Prior Nuclear Study: No images to compare  Overall Impression:  Normal stress nuclear study.  LV Ejection Fraction: 69%.  LV Wall Motion:  NL LV Function; NL Wall Motion   Loralie Champagne 06/13/2014

## 2014-06-14 ENCOUNTER — Encounter: Payer: Medicare HMO | Admitting: Internal Medicine

## 2014-07-05 DIAGNOSIS — H25813 Combined forms of age-related cataract, bilateral: Secondary | ICD-10-CM | POA: Diagnosis not present

## 2014-07-10 DIAGNOSIS — Z91040 Latex allergy status: Secondary | ICD-10-CM | POA: Diagnosis not present

## 2014-07-10 DIAGNOSIS — F329 Major depressive disorder, single episode, unspecified: Secondary | ICD-10-CM | POA: Diagnosis not present

## 2014-07-10 DIAGNOSIS — H409 Unspecified glaucoma: Secondary | ICD-10-CM | POA: Diagnosis not present

## 2014-07-10 DIAGNOSIS — Z79899 Other long term (current) drug therapy: Secondary | ICD-10-CM | POA: Diagnosis not present

## 2014-07-10 DIAGNOSIS — E780 Pure hypercholesterolemia: Secondary | ICD-10-CM | POA: Diagnosis not present

## 2014-07-10 DIAGNOSIS — K219 Gastro-esophageal reflux disease without esophagitis: Secondary | ICD-10-CM | POA: Diagnosis not present

## 2014-07-10 DIAGNOSIS — Z883 Allergy status to other anti-infective agents status: Secondary | ICD-10-CM | POA: Diagnosis not present

## 2014-07-10 DIAGNOSIS — Z880 Allergy status to penicillin: Secondary | ICD-10-CM | POA: Diagnosis not present

## 2014-07-10 DIAGNOSIS — G4733 Obstructive sleep apnea (adult) (pediatric): Secondary | ICD-10-CM | POA: Diagnosis not present

## 2014-07-10 DIAGNOSIS — Z886 Allergy status to analgesic agent status: Secondary | ICD-10-CM | POA: Diagnosis not present

## 2014-07-10 DIAGNOSIS — I10 Essential (primary) hypertension: Secondary | ICD-10-CM | POA: Diagnosis not present

## 2014-07-10 DIAGNOSIS — J45909 Unspecified asthma, uncomplicated: Secondary | ICD-10-CM | POA: Diagnosis not present

## 2014-07-10 DIAGNOSIS — Z7951 Long term (current) use of inhaled steroids: Secondary | ICD-10-CM | POA: Diagnosis not present

## 2014-07-10 DIAGNOSIS — Z91041 Radiographic dye allergy status: Secondary | ICD-10-CM | POA: Diagnosis not present

## 2014-07-10 DIAGNOSIS — Z887 Allergy status to serum and vaccine status: Secondary | ICD-10-CM | POA: Diagnosis not present

## 2014-07-10 DIAGNOSIS — H25811 Combined forms of age-related cataract, right eye: Secondary | ICD-10-CM | POA: Diagnosis not present

## 2014-07-11 DIAGNOSIS — Z9841 Cataract extraction status, right eye: Secondary | ICD-10-CM | POA: Diagnosis not present

## 2014-07-12 ENCOUNTER — Telehealth: Payer: Self-pay | Admitting: Internal Medicine

## 2014-07-12 MED ORDER — HYDROCODONE-ACETAMINOPHEN 10-325 MG PO TABS: 1.0000 | ORAL_TABLET | Freq: Four times a day (QID) | ORAL | Status: DC | PRN

## 2014-07-12 NOTE — Telephone Encounter (Signed)
Pt request refill of the following: HYDROcodone-acetaminophen (NORCO) 10-325 MG per tablet ° ° °Phamacy: ° °

## 2014-07-12 NOTE — Telephone Encounter (Signed)
Pt notified Rx ready for pickup. Rx printed and signed by Dr. [name].  

## 2014-07-18 ENCOUNTER — Telehealth: Payer: Self-pay | Admitting: Internal Medicine

## 2014-07-18 NOTE — Telephone Encounter (Signed)
Patient's plan only allow patient to receive quantity 1 per day of benazepril hcl/hctz.

## 2014-07-19 NOTE — Telephone Encounter (Signed)
Changed to benazepril 40 mg, #90 one daily Hydrochlorothiazide 25 mg #90 one daily  Refill times 4 each

## 2014-07-19 NOTE — Telephone Encounter (Signed)
Dr. Raliegh Ip, pt's drug plan will only cover her blood pressure medication 1 pill per day and she is on Benazepril/HCTZ twice a day. Please advise

## 2014-07-19 NOTE — Telephone Encounter (Signed)
Left message on voicemail to call office.  

## 2014-07-20 MED ORDER — HYDROCHLOROTHIAZIDE 25 MG PO TABS: 25.0000 mg | ORAL_TABLET | Freq: Every day | ORAL | Status: DC

## 2014-07-20 MED ORDER — BENAZEPRIL HCL 40 MG PO TABS: 40.0000 mg | ORAL_TABLET | Freq: Every day | ORAL | Status: DC

## 2014-07-20 NOTE — Telephone Encounter (Signed)
Pt called back, told her we needed to change blood pressure medication due to insurance will not cover two pills a day. Medication changed to Benazepril 40 mg daily and HCTZ 25 mg daily. This will replace Benazepril/HCTZ that you take twice a day. Rx's sent to pharmacy.Pt verbalized understanding.

## 2014-07-23 ENCOUNTER — Ambulatory Visit: Payer: Medicare Other | Admitting: Internal Medicine

## 2014-07-26 DIAGNOSIS — E780 Pure hypercholesterolemia: Secondary | ICD-10-CM | POA: Diagnosis not present

## 2014-07-26 DIAGNOSIS — F329 Major depressive disorder, single episode, unspecified: Secondary | ICD-10-CM | POA: Diagnosis not present

## 2014-07-26 DIAGNOSIS — J45909 Unspecified asthma, uncomplicated: Secondary | ICD-10-CM | POA: Diagnosis not present

## 2014-07-26 DIAGNOSIS — I10 Essential (primary) hypertension: Secondary | ICD-10-CM | POA: Diagnosis not present

## 2014-07-26 DIAGNOSIS — K219 Gastro-esophageal reflux disease without esophagitis: Secondary | ICD-10-CM | POA: Diagnosis not present

## 2014-07-26 DIAGNOSIS — G4733 Obstructive sleep apnea (adult) (pediatric): Secondary | ICD-10-CM | POA: Diagnosis not present

## 2014-07-26 DIAGNOSIS — H25812 Combined forms of age-related cataract, left eye: Secondary | ICD-10-CM | POA: Diagnosis not present

## 2014-07-26 DIAGNOSIS — H4011X0 Primary open-angle glaucoma, stage unspecified: Secondary | ICD-10-CM | POA: Diagnosis not present

## 2014-07-27 DIAGNOSIS — Z961 Presence of intraocular lens: Secondary | ICD-10-CM | POA: Insufficient documentation

## 2014-07-31 ENCOUNTER — Ambulatory Visit: Payer: Medicare Other | Admitting: Internal Medicine

## 2014-08-03 ENCOUNTER — Other Ambulatory Visit: Payer: Self-pay | Admitting: *Deleted

## 2014-08-03 ENCOUNTER — Encounter: Payer: Self-pay | Admitting: Internal Medicine

## 2014-08-03 ENCOUNTER — Ambulatory Visit (INDEPENDENT_AMBULATORY_CARE_PROVIDER_SITE_OTHER): Payer: Medicare Other | Admitting: Internal Medicine

## 2014-08-03 VITALS — BP 136/70 | HR 64 | Temp 97.6°F | Resp 20 | Ht 64.0 in | Wt 214.0 lb

## 2014-08-03 DIAGNOSIS — M65312 Trigger thumb, left thumb: Secondary | ICD-10-CM | POA: Diagnosis not present

## 2014-08-03 DIAGNOSIS — I10 Essential (primary) hypertension: Secondary | ICD-10-CM | POA: Diagnosis not present

## 2014-08-03 DIAGNOSIS — F4323 Adjustment disorder with mixed anxiety and depressed mood: Secondary | ICD-10-CM | POA: Diagnosis not present

## 2014-08-03 DIAGNOSIS — E785 Hyperlipidemia, unspecified: Secondary | ICD-10-CM

## 2014-08-03 MED ORDER — ESCITALOPRAM OXALATE 5 MG PO TABS: 5.0000 mg | ORAL_TABLET | Freq: Every day | ORAL | Status: DC

## 2014-08-03 MED ORDER — ALPRAZOLAM 0.25 MG PO TABS: 0.2500 mg | ORAL_TABLET | Freq: Three times a day (TID) | ORAL | Status: DC | PRN

## 2014-08-03 NOTE — Patient Instructions (Signed)
Limit your sodium (Salt) intake    It is important that you exercise regularly, at least 20 minutes 3 to 4 times per week.  If you develop chest pain or shortness of breath seek  medical attention.  Return in 6 months for follow-up  

## 2014-08-03 NOTE — Progress Notes (Signed)
Pre visit review using our clinic review tool, if applicable. No additional management support is needed unless otherwise documented below in the visit note. 

## 2014-08-03 NOTE — Progress Notes (Signed)
Subjective:    Patient ID: Miranda [name], female    DOB: [birth year], 69 y.o.   MRN: [id number]  HPI  69 year old patient who is seen today in follow-up.  She has had some significant situational stressors lately and was seen several weeks ago and placed on low-dose Lexapro.  She also has been on alprazolam when necessary, which she takes rarely.  She also presented with some exertional chest pain and subsequently nuclear stress test normal.  Her daughter who has had a MI is recovering nicely.  Patient feels remarkably well today and has no concerns or complaints.  She is sleeping well.  Very rare alprazolam use  Past Medical History  Diagnosis Date  . GERD (gastroesophageal reflux disease)   . Hyperlipidemia   . Low back pain   . Asthma   . Colon polyps   . Hemorrhoids   . Complication of anesthesia     states requires a lot med. to put her to sleep   . Family history of anesthesia complication   . CHF (congestive heart failure)     pt. unsure- but thinks she was hosp. for CHF- 2002  . [facility]     [facility]  . Anxiety   . Depression     "sometimes "  . Shortness of breath   . Sleep apnea     uses c-pap- q night recently  . DDD (degenerative disc disease) 09/17/2011  . Glaucoma     bilateral, pt. admits that she is noncompliant to eye gtts.   . Hypertension     had stress, echo- 2006 /w [place], Cardiac Cath, per pt. 2002, echo repeated 2012- wnl     History   Social History  . Marital Status: Married    Spouse Name: N/A  . Number of Children: N/A  . Years of Education: N/A   Occupational History  . retired Quarry manager    Social History Main Topics  . Smoking status: Never Smoker   . Smokeless tobacco: Never Used  . Alcohol Use: No  . Drug Use: No  . Sexual Activity: No   Other Topics Concern  . Not on file   Social History Narrative    Past Surgical History  Procedure Laterality Date  . Cardiac catheterization    . Colonoscopy     remote  . Abdominal hysterectomy      ectopic, fibroids  . Foot surgery      bilat, heel spurs- screw in R foot   . Flexible sigmoidoscopy  02/25/2011    Procedure: FLEXIBLE SIGMOIDOSCOPY;  Surgeon: Inda Castle, MD;  Location: [facility];  Service: Endoscopy;  Laterality: N/A;  . Eye surgery      cataracts removed bilateral- ?IOL  . Sphincterotomy  10/08/2011    Procedure: SPHINCTEROTOMY;  Surgeon: Stark Klein, MD;  Location: [place];  Service: General;  Laterality: N/A;  . Fissurectomy  10/08/2011    Procedure: FISSURECTOMY;  Surgeon: Stark Klein, MD;  Location: [place];  Service: General;  Laterality: N/A;  . Hemorrhoid surgery  10/08/2011    Procedure: HEMORRHOIDECTOMY;  Surgeon: Stark Klein, MD;  Location: [place];  Service: General;  Laterality: N/A;  External     Family History  Problem Relation Age of Onset  . Cancer Mother     breast  . Heart attack Mother   . Heart disease Mother   . Cancer Other     breast, colon, lung  . Heart disease Other   .  Anesthesia problems Neg Hx   . Emphysema Sister   . Cancer Sister     breast  . Asthma Sister   . Heart disease Sister   . Rheum arthritis Sister   . Cancer Brother     colon    Allergies  Allergen Reactions  . Fish Oil Anaphylaxis  . Penicillins Anaphylaxis  . Aspirin Itching and Rash  . Bactrim [Sulfamethoxazole-Trimethoprim] Hives, Itching and Rash  . Ciprofloxacin Hives, Itching and Rash  . Ibuprofen Rash    REACTION: rash  . Influenza Vaccines Hives  . Ivp Dye [Iodinated Diagnostic Agents] Hives, Itching and Rash    Gives benadryl to counteract symptoms  . Latex Rash  . Macrobid [Nitrofurantoin Monohydrate Macrocrystals] Hives  . Shellfish Allergy Hives    Patient also allergic to seafood    Current Outpatient Prescriptions on File Prior to Visit  Medication Sig Dispense Refill  . albuterol (PROVENTIL HFA;VENTOLIN HFA) 108 (90 BASE) MCG/ACT inhaler Inhale 2 puffs into the lungs every 6 (six) hours as  needed. 1 Inhaler 5  . amLODipine (NORVASC) 5 MG tablet Take 1 tablet (5 mg total) by mouth daily. 90 tablet 1  . benazepril (LOTENSIN) 40 MG tablet Take 1 tablet (40 mg total) by mouth daily. 90 tablet 3  . cyclobenzaprine (FLEXERIL) 10 MG tablet Take 1 tablet (10 mg total) by mouth 3 (three) times daily as needed. 90 tablet 2  . Fluticasone-Salmeterol (ADVAIR) 250-50 MCG/DOSE AEPB Inhale 1 puff into the lungs every 12 (twelve) hours. 60 each 6  . furosemide (LASIX) 40 MG tablet Take 1 tablet (40 mg total) by mouth daily. 90 tablet 1  . hydrochlorothiazide (HYDRODIURIL) 25 MG tablet Take 1 tablet (25 mg total) by mouth daily. 90 tablet 3  . HYDROcodone-acetaminophen (NORCO) 10-325 MG per tablet Take 1 tablet by mouth every 6 (six) hours as needed. 90 tablet 0  . lactulose (CHRONULAC) 10 GM/15ML solution TAKE 30 MLS BY MOUTH 3 TMES DAILY 500 mL 1  . lovastatin (MEVACOR) 40 MG tablet Take 2 tablets (80 mg total) by mouth at bedtime. 180 tablet 1  . meloxicam (MOBIC) 15 MG tablet Take 1 tablet (15 mg total) by mouth daily. 60 tablet 2  . metoprolol (LOPRESSOR) 50 MG tablet Take 1 tablet (50 mg total) by mouth 2 (two) times daily. 180 tablet 1  . omeprazole (PRILOSEC) 20 MG capsule TAKE TWO CAPSULES BY MOUTH ONCE DAILY 60 capsule 5  . travoprost, benzalkonium, (TRAVATAN) 0.004 % ophthalmic solution Place 1 drop into both eyes at bedtime.     No current facility-administered medications on file prior to visit.    BP 136/70 mmHg  Pulse 64  Temp(Src) 97.6 F (36.4 C) (Oral)  Resp 20  Ht 5\' 4"  (1.626 m)  Wt 214 lb (97.07 kg)  BMI 36.72 kg/m2  SpO2 98%     Review of Systems  Psychiatric/Behavioral: The patient is nervous/anxious.        Objective:   Physical Exam  Constitutional: She is oriented to person, place, and time. She appears well-developed and well-nourished.  HENT:  Head: Normocephalic.  [name] Ear: External ear normal.  Left Ear: External ear normal.  Mouth/Throat:  Oropharynx is clear and moist.  Eyes: Conjunctivae and EOM are normal. Pupils are equal, round, and reactive to light.  Neck: Normal range of motion. Neck supple. No thyromegaly present.  Cardiovascular: Normal rate, regular rhythm, normal heart sounds and intact distal pulses.   Pulmonary/Chest: Effort normal and breath sounds  normal.  Abdominal: Soft. Bowel sounds are normal. She exhibits no mass. There is no tenderness.  Musculoskeletal: Normal range of motion.  Lymphadenopathy:    She has no cervical adenopathy.  Neurological: She is alert and oriented to person, place, and time.  Skin: Skin is warm and dry. No rash noted.  Psychiatric: She has a normal mood and affect. Her behavior is normal.          Assessment & Plan:   Situational anxiety, depression.  Very nice clinical response.  No change in medical regimen.  Will report any clinical change.  Otherwise, we'll reassess in 6 months Essential hypertension, stable

## 2014-08-15 ENCOUNTER — Telehealth: Payer: Self-pay

## 2014-08-15 DIAGNOSIS — Z1231 Encounter for screening mammogram for malignant neoplasm of breast: Secondary | ICD-10-CM

## 2014-08-15 NOTE — Telephone Encounter (Signed)
Spoke with pt., ordered mammogram. 

## 2014-09-07 ENCOUNTER — Telehealth: Payer: Self-pay | Admitting: Internal Medicine

## 2014-09-07 MED ORDER — HYDROCODONE-ACETAMINOPHEN 10-325 MG PO TABS: 1.0000 | ORAL_TABLET | Freq: Four times a day (QID) | ORAL | Status: DC | PRN

## 2014-09-07 NOTE — Telephone Encounter (Signed)
Left message with family member to call office. Rx printed and signed.

## 2014-09-07 NOTE — Telephone Encounter (Signed)
Pt request refill HYDROcodone-acetaminophen (NORCO) 10-325 MG per tablet °

## 2014-09-10 ENCOUNTER — Encounter: Payer: Self-pay | Admitting: Internal Medicine

## 2014-09-10 ENCOUNTER — Ambulatory Visit (INDEPENDENT_AMBULATORY_CARE_PROVIDER_SITE_OTHER): Payer: Medicare Other | Admitting: Internal Medicine

## 2014-09-10 VITALS — BP 150/80 | HR 70 | Temp 98.0°F | Resp 20 | Ht 64.0 in | Wt 208.0 lb

## 2014-09-10 DIAGNOSIS — I10 Essential (primary) hypertension: Secondary | ICD-10-CM

## 2014-09-10 DIAGNOSIS — E876 Hypokalemia: Secondary | ICD-10-CM | POA: Diagnosis not present

## 2014-09-10 DIAGNOSIS — R252 Cramp and spasm: Secondary | ICD-10-CM | POA: Diagnosis not present

## 2014-09-10 MED ORDER — POTASSIUM CHLORIDE CRYS ER 20 MEQ PO TBCR: 20.0000 meq | EXTENDED_RELEASE_TABLET | Freq: Every day | ORAL | Status: DC

## 2014-09-10 MED ORDER — FUROSEMIDE 40 MG PO TABS: 40.0000 mg | ORAL_TABLET | Freq: Every day | ORAL | Status: DC

## 2014-09-10 MED ORDER — MELOXICAM 15 MG PO TABS: 15.0000 mg | ORAL_TABLET | Freq: Every day | ORAL | Status: DC

## 2014-09-10 MED ORDER — METOPROLOL TARTRATE 50 MG PO TABS: 50.0000 mg | ORAL_TABLET | Freq: Two times a day (BID) | ORAL | Status: DC

## 2014-09-10 MED ORDER — ALBUTEROL SULFATE HFA 108 (90 BASE) MCG/ACT IN AERS: 2.0000 | INHALATION_SPRAY | Freq: Four times a day (QID) | RESPIRATORY_TRACT | Status: DC | PRN

## 2014-09-10 MED ORDER — FLUTICASONE-SALMETEROL 250-50 MCG/DOSE IN AEPB: 1.0000 | INHALATION_SPRAY | Freq: Two times a day (BID) | RESPIRATORY_TRACT | Status: DC

## 2014-09-10 NOTE — Progress Notes (Signed)
Pre visit review using our clinic review tool, if applicable. No additional management support is needed unless otherwise documented below in the visit note. 

## 2014-09-10 NOTE — Telephone Encounter (Signed)
Pt picked up Rx.

## 2014-09-10 NOTE — Patient Instructions (Signed)
Leg Cramps Leg cramps that occur during exercise can be caused by poor circulation or dehydration. However, muscle cramps that occur at rest or during the night are usually not due to any serious medical problem. Heat cramps may cause muscle spasms during hot weather.  CAUSES There is no clear cause for muscle cramps. However, dehydration may be a factor for those who do not drink enough fluids and those who exercise in the heat. Imbalances in the level of sodium, potassium, calcium or magnesium in the muscle tissue may also be a factor. Some medications, such as water pills (diuretics), may cause loss of chemicals that the body needs (like sodium and potassium) and cause muscle cramps. TREATMENT   Make sure your diet has enough fluids and essential minerals for the muscle to work normally.  Avoid strenuous exercise for several days if you have been having frequent leg cramps.  Stretch and massage the cramped muscle for several minutes.  Some medicines may be helpful in some patients with night cramps. Only take over-the-counter or prescription medicines as directed by your caregiver. SEEK IMMEDIATE MEDICAL CARE IF:   Your leg cramps become worse.  Your foot becomes cold, numb, or blue. Document Released: 04/09/2004 Document Revised: 05/25/2011 Document Reviewed: 03/27/2008 Exit[employer] Patient Information 2015 [employer]. This information is not intended to replace advice given to you by your health care provider. Make sure you discuss any questions you have with your health care provider.  

## 2014-09-10 NOTE — Progress Notes (Signed)
Subjective:    Patient ID: Miranda [name], female    DOB: June 06, 1945, 69 y.o.   MRN: [id number]  HPI  69 year old patient who has treated hypertension and also has a history of hypokalemia.  Blood pressure regimen includes diuretic therapy.  For the past few weeks she has had recurrent cramps involving the legs, [name] greater than the left. She has normal renal function. Past Medical History  Diagnosis Date  . GERD (gastroesophageal reflux disease)   . Hyperlipidemia   . Low back pain   . Asthma   . Colon polyps   . Hemorrhoids   . Complication of anesthesia     states requires a lot med. to put her to sleep   . Family history of anesthesia complication   . CHF (congestive heart failure)     pt. unsure- but thinks she was hosp. for CHF- 2002  . [facility] kidney disease     [facility]  . Anxiety   . Depression     "sometimes "  . Shortness of breath   . Sleep apnea     uses c-pap- q night recently  . DDD (degenerative disc disease) 09/17/2011  . Glaucoma     bilateral, pt. admits that she is noncompliant to eye gtts.   . Hypertension     had stress, echo- 2006 /w [place], Cardiac Cath, per pt. 2002, echo repeated 2012- wnl     History   Social History  . Marital Status: Married    Spouse Name: N/A  . Number of Children: N/A  . Years of Education: N/A   Occupational History  . retired Quarry manager    Social History Main Topics  . Smoking status: Never Smoker   . Smokeless tobacco: Never Used  . Alcohol Use: No  . Drug Use: No  . Sexual Activity: No   Other Topics Concern  . Not on file   Social History Narrative    Past Surgical History  Procedure Laterality Date  . Cardiac catheterization    . Colonoscopy      remote  . Abdominal hysterectomy      ectopic, fibroids  . Foot surgery      bilat, heel spurs- screw in R foot   . Flexible sigmoidoscopy  02/25/2011    Procedure: FLEXIBLE SIGMOIDOSCOPY;  Surgeon: Inda Castle, MD;  Location: [facility];  Service: Endoscopy;  Laterality: N/A;  . Eye surgery      cataracts removed bilateral- ?IOL  . Sphincterotomy  10/08/2011    Procedure: SPHINCTEROTOMY;  Surgeon: Stark Klein, MD;  Location: [place];  Service: General;  Laterality: N/A;  . Fissurectomy  10/08/2011    Procedure: FISSURECTOMY;  Surgeon: Stark Klein, MD;  Location: [place];  Service: General;  Laterality: N/A;  . Hemorrhoid surgery  10/08/2011    Procedure: HEMORRHOIDECTOMY;  Surgeon: Stark Klein, MD;  Location: [place];  Service: General;  Laterality: N/A;  External     Family History  Problem Relation Age of Onset  . Cancer Mother     breast  . Heart attack Mother   . Heart disease Mother   . Cancer Other     breast, colon, lung  . Heart disease Other   . Anesthesia problems Neg Hx   . Emphysema Sister   . Cancer Sister     breast  . Asthma Sister   . Heart disease Sister   . Rheum arthritis Sister   . Cancer Brother  colon    Allergies  Allergen Reactions  . Fish Oil Anaphylaxis  . Penicillins Anaphylaxis  . Aspirin Itching and Rash  . Bactrim [Sulfamethoxazole-Trimethoprim] Hives, Itching and Rash  . Ciprofloxacin Hives, Itching and Rash  . Ibuprofen Rash    REACTION: rash  . Influenza Vaccines Hives  . Ivp Dye [Iodinated Diagnostic Agents] Hives, Itching and Rash    Gives benadryl to counteract symptoms  . Latex Rash  . Macrobid [Nitrofurantoin Monohydrate Macrocrystals] Hives  . Shellfish Allergy Hives    Patient also allergic to seafood    Current Outpatient Prescriptions on File Prior to Visit  Medication Sig Dispense Refill  . ALPRAZolam (XANAX) 0.25 MG tablet Take 1 tablet (0.25 mg total) by mouth 3 (three) times daily as needed for sleep. 60 tablet 5  . amLODipine (NORVASC) 5 MG tablet Take 1 tablet (5 mg total) by mouth daily. 90 tablet 1  . benazepril (LOTENSIN) 40 MG tablet Take 1 tablet (40 mg total) by mouth daily. 90 tablet 3  . cyclobenzaprine (FLEXERIL) 10 MG tablet Take  1 tablet (10 mg total) by mouth 3 (three) times daily as needed. 90 tablet 2  . escitalopram (LEXAPRO) 5 MG tablet Take 1 tablet (5 mg total) by mouth daily. 60 tablet 3  . hydrochlorothiazide (HYDRODIURIL) 25 MG tablet Take 1 tablet (25 mg total) by mouth daily. 90 tablet 3  . HYDROcodone-acetaminophen (NORCO) 10-325 MG per tablet Take 1 tablet by mouth every 6 (six) hours as needed. 90 tablet 0  . lactulose (CHRONULAC) 10 GM/15ML solution TAKE 30 MLS BY MOUTH 3 TMES DAILY 500 mL 1  . lovastatin (MEVACOR) 40 MG tablet Take 2 tablets (80 mg total) by mouth at bedtime. 180 tablet 1  . omeprazole (PRILOSEC) 20 MG capsule TAKE TWO CAPSULES BY MOUTH ONCE DAILY 60 capsule 5  . travoprost, benzalkonium, (TRAVATAN) 0.004 % ophthalmic solution Place 1 drop into both eyes at bedtime.     No current facility-administered medications on file prior to visit.    BP 150/80 mmHg  Pulse 70  Temp(Src) 98 F (36.7 C) (Oral)  Resp 20  Ht 5\' 4"  (1.626 m)  Wt 208 lb (94.348 kg)  BMI 35.69 kg/m2  SpO2 98%     Review of Systems  Constitutional: Negative.   HENT: Negative for congestion, dental problem, hearing loss, rhinorrhea, sinus pressure, sore throat and tinnitus.   Eyes: Negative for pain, discharge and visual disturbance.  Respiratory: Negative for cough and shortness of breath.   Cardiovascular: Negative for chest pain, palpitations and leg swelling.  Gastrointestinal: Negative for nausea, vomiting, abdominal pain, diarrhea, constipation, blood in stool and abdominal distention.  Genitourinary: Negative for dysuria, urgency, frequency, hematuria, flank pain, vaginal bleeding, vaginal discharge, difficulty urinating, vaginal pain and pelvic pain.  Musculoskeletal: Positive for myalgias. Negative for joint swelling, arthralgias and gait problem.  Skin: Negative for rash.  Neurological: Negative for dizziness, syncope, speech difficulty, weakness, numbness and headaches.  Hematological: Negative  for adenopathy.  Psychiatric/Behavioral: Negative for behavioral problems, dysphoric mood and agitation. The patient is not nervous/anxious.        Objective:   Physical Exam  Constitutional: She appears well-developed and well-nourished. No distress.  Cardiovascular: Intact distal pulses.   Musculoskeletal: She exhibits edema.          Assessment & Plan:   Recurrent cramping of lower extremities. History of hypokalemia Chronic diuretic use  We'll place on potassium supplementation

## 2014-09-21 DIAGNOSIS — H26493 Other secondary cataract, bilateral: Secondary | ICD-10-CM | POA: Insufficient documentation

## 2014-10-09 ENCOUNTER — Telehealth: Payer: Self-pay | Admitting: Internal Medicine

## 2014-10-09 MED ORDER — HYDROCODONE-ACETAMINOPHEN 10-325 MG PO TABS: 1.0000 | ORAL_TABLET | Freq: Four times a day (QID) | ORAL | Status: DC | PRN

## 2014-10-09 NOTE — Telephone Encounter (Signed)
Left detailed message Rx ready for pickup. Rx printed and signed. 

## 2014-10-09 NOTE — Telephone Encounter (Signed)
Pt request refill of the following: HYDROcodone-acetaminophen (NORCO) 10-325 MG per tablet ° ° °Phamacy: ° °

## 2014-10-15 DIAGNOSIS — H4011X4 Primary open-angle glaucoma, indeterminate stage: Secondary | ICD-10-CM | POA: Diagnosis not present

## 2014-11-13 ENCOUNTER — Telehealth: Payer: Self-pay | Admitting: Internal Medicine

## 2014-11-13 NOTE — Telephone Encounter (Signed)
Pt would like a refill on her Norco.

## 2014-11-14 MED ORDER — HYDROCODONE-ACETAMINOPHEN 10-325 MG PO TABS: 1.0000 | ORAL_TABLET | Freq: Four times a day (QID) | ORAL | Status: DC | PRN

## 2014-11-14 NOTE — Telephone Encounter (Signed)
Left detailed message Rx ready for pickup, will be at the front desk. Rx printed and signed. 

## 2014-11-23 DIAGNOSIS — H26493 Other secondary cataract, bilateral: Secondary | ICD-10-CM | POA: Diagnosis not present

## 2014-11-30 DIAGNOSIS — M4316 Spondylolisthesis, lumbar region: Secondary | ICD-10-CM | POA: Diagnosis not present

## 2014-11-30 DIAGNOSIS — M5136 Other intervertebral disc degeneration, lumbar region: Secondary | ICD-10-CM | POA: Diagnosis not present

## 2014-12-14 ENCOUNTER — Telehealth: Payer: Self-pay | Admitting: Internal Medicine

## 2014-12-14 DIAGNOSIS — H4011X4 Primary open-angle glaucoma, indeterminate stage: Secondary | ICD-10-CM | POA: Diagnosis not present

## 2014-12-14 NOTE — Telephone Encounter (Signed)
Pt needs refill on HYDROcodone-acetaminophen (NORCO) 10-325 MG per tablet. °

## 2014-12-17 MED ORDER — HYDROCODONE-ACETAMINOPHEN 10-325 MG PO TABS: 1.0000 | ORAL_TABLET | Freq: Four times a day (QID) | ORAL | Status: DC | PRN

## 2014-12-17 NOTE — Telephone Encounter (Signed)
Left message on voicemail Rx ready for pickup will be at the front desk. Rx printed and signed.  

## 2014-12-26 DIAGNOSIS — M5136 Other intervertebral disc degeneration, lumbar region: Secondary | ICD-10-CM | POA: Diagnosis not present

## 2015-01-15 ENCOUNTER — Telehealth: Payer: Self-pay | Admitting: Internal Medicine

## 2015-01-15 DIAGNOSIS — M5136 Other intervertebral disc degeneration, lumbar region: Secondary | ICD-10-CM | POA: Diagnosis not present

## 2015-01-15 DIAGNOSIS — M4316 Spondylolisthesis, lumbar region: Secondary | ICD-10-CM | POA: Diagnosis not present

## 2015-01-15 NOTE — Telephone Encounter (Signed)
° ° °  Pt request refill of the following: ° ° °HYDROcodone-acetaminophen (NORCO) 10-325 MG tablet ° ° °Phamacy: °

## 2015-01-16 ENCOUNTER — Encounter: Payer: Self-pay | Admitting: Family Medicine

## 2015-01-16 ENCOUNTER — Ambulatory Visit (INDEPENDENT_AMBULATORY_CARE_PROVIDER_SITE_OTHER): Payer: Medicare Other | Admitting: Family Medicine

## 2015-01-16 VITALS — BP 152/75 | HR 54 | Ht 64.0 in | Wt 206.0 lb

## 2015-01-16 DIAGNOSIS — J209 Acute bronchitis, unspecified: Secondary | ICD-10-CM | POA: Diagnosis not present

## 2015-01-16 MED ORDER — CLARITHROMYCIN 500 MG PO TABS: 500.0000 mg | ORAL_TABLET | Freq: Two times a day (BID) | ORAL | Status: DC

## 2015-01-16 MED ORDER — LINACLOTIDE 290 MCG PO CAPS: 290.0000 ug | ORAL_CAPSULE | Freq: Every day | ORAL | Status: DC

## 2015-01-16 MED ORDER — HYDROCODONE-ACETAMINOPHEN 10-325 MG PO TABS: 1.0000 | ORAL_TABLET | Freq: Four times a day (QID) | ORAL | Status: DC | PRN

## 2015-01-16 MED ORDER — HYDROCODONE-HOMATROPINE 5-1.5 MG/5ML PO SYRP: 5.0000 mL | ORAL_SOLUTION | ORAL | Status: DC | PRN

## 2015-01-16 NOTE — Progress Notes (Signed)
   Subjective:    Patient ID: Miranda [name], female    DOB: [birth year], 69 y.o.   MRN: [id number]  HPI Here for 6 days of chest tightness and coughing up green sputum. No fever. Using Delsym.    Review of Systems  Constitutional: Negative.   HENT: Positive for congestion. Negative for ear pain, postnasal drip and sore throat.   Eyes: Negative.   Respiratory: Positive for cough and chest tightness. Negative for shortness of breath and wheezing.   Cardiovascular: Negative.        Objective:   Physical Exam  Constitutional: She appears well-developed and well-nourished.  HENT:  [name] Ear: External ear normal.  Left Ear: External ear normal.  Nose: Nose normal.  Mouth/Throat: Oropharynx is clear and moist.  Eyes: Conjunctivae are normal.  Neck: No thyromegaly present.  Cardiovascular: Normal rate, regular rhythm, normal heart sounds and intact distal pulses.   Pulmonary/Chest: Effort normal. No respiratory distress. She has no wheezes. She has no rales.  Scattered rhonchi   Lymphadenopathy:    She has no cervical adenopathy.          Assessment & Plan:  Bronchitis, treat with Biaxin. Written out of work from 01-11-15 until tomorrow.

## 2015-01-16 NOTE — Telephone Encounter (Signed)
Left message on voicemail Rx ready for pickup, will be at the front desk. Rx printed and signed. 

## 2015-01-16 NOTE — Progress Notes (Signed)
Pre visit review using our clinic review tool, if applicable. No additional management support is needed unless otherwise documented below in the visit note. 

## 2015-03-04 ENCOUNTER — Other Ambulatory Visit: Payer: Self-pay | Admitting: Internal Medicine

## 2015-03-04 ENCOUNTER — Telehealth: Payer: Self-pay | Admitting: Internal Medicine

## 2015-03-04 MED ORDER — HYDROCODONE-ACETAMINOPHEN 10-325 MG PO TABS: 1.0000 | ORAL_TABLET | Freq: Four times a day (QID) | ORAL | Status: DC | PRN

## 2015-03-04 NOTE — Telephone Encounter (Signed)
Pt would like a refill on her Norco please.

## 2015-03-04 NOTE — Telephone Encounter (Signed)
Pt also needs a Metoprolol, Cyclobenzaprine  and Lactulose called in in addition to requesting her Norco refill.

## 2015-03-05 NOTE — Telephone Encounter (Signed)
Pt notified Rx ready for pickup. Rx printed and signed.  

## 2015-03-06 ENCOUNTER — Telehealth: Payer: Self-pay | Admitting: Internal Medicine

## 2015-03-06 NOTE — Telephone Encounter (Signed)
Patient requesting refill on cyclobenzaprine (FLEXERIL) 10 MG tablet, lactulose (CHRONULAC) 10 GM/15ML solution, escitalopram (LEXAPRO) 5 MG tablet  Pharmacy: [facility]

## 2015-03-07 MED ORDER — CYCLOBENZAPRINE HCL 10 MG PO TABS: 10.0000 mg | ORAL_TABLET | Freq: Three times a day (TID) | ORAL | Status: DC | PRN

## 2015-03-07 MED ORDER — ESCITALOPRAM OXALATE 5 MG PO TABS: 5.0000 mg | ORAL_TABLET | Freq: Every day | ORAL | Status: DC

## 2015-03-07 MED ORDER — LACTULOSE 10 GM/15ML PO SOLN: ORAL | Status: DC

## 2015-03-07 NOTE — Telephone Encounter (Signed)
Spoke to pt, told her Rx's were sent to pharmacy. Pt verbalized understanding.

## 2015-03-08 ENCOUNTER — Telehealth: Payer: Self-pay | Admitting: *Deleted

## 2015-03-08 MED ORDER — LACTULOSE 10 GM/15ML PO SOLN: 20.0000 g | Freq: Three times a day (TID) | ORAL | Status: DC | PRN

## 2015-03-08 NOTE — Telephone Encounter (Signed)
New Rx sent to pharmacy

## 2015-03-08 NOTE — Telephone Encounter (Signed)
Patient was prescribed Lactulose (Chronulac) 10 gm/15 ml solution.  Instructions: Take 30 mL by mouth three times daily  Can you please confirm quantity/dosage and refill amount for pharmacy?

## 2015-03-08 NOTE — Telephone Encounter (Signed)
Ok  30 ml tid prn constipation

## 2015-03-18 ENCOUNTER — Emergency Department (HOSPITAL_COMMUNITY): Admission: EM | Admit: 2015-03-18 | Discharge: 2015-03-18 | Discharge status: Absent without leave | Payer: Self-pay

## 2015-03-18 ENCOUNTER — Inpatient Hospital Stay (HOSPITAL_COMMUNITY)
Admission: EM | Admit: 2015-03-18 | Discharge: 2015-03-23 | DRG: 193 | Disposition: A | Discharge status: Home Health | Payer: Medicare Other | Attending: Internal Medicine | Admitting: Internal Medicine

## 2015-03-18 ENCOUNTER — Emergency Department (HOSPITAL_COMMUNITY): Payer: Medicare Other

## 2015-03-18 ENCOUNTER — Encounter (HOSPITAL_COMMUNITY): Payer: Self-pay | Admitting: Emergency Medicine

## 2015-03-18 DIAGNOSIS — J9601 Acute respiratory failure with hypoxia: Secondary | ICD-10-CM | POA: Diagnosis not present

## 2015-03-18 DIAGNOSIS — Z8249 Family history of ischemic heart disease and other diseases of the circulatory system: Secondary | ICD-10-CM | POA: Diagnosis not present

## 2015-03-18 DIAGNOSIS — Z825 Family history of asthma and other chronic lower respiratory diseases: Secondary | ICD-10-CM

## 2015-03-18 DIAGNOSIS — G4730 Sleep apnea, unspecified: Secondary | ICD-10-CM | POA: Diagnosis not present

## 2015-03-18 DIAGNOSIS — J189 Pneumonia, unspecified organism: Secondary | ICD-10-CM | POA: Diagnosis not present

## 2015-03-18 DIAGNOSIS — Z9119 Patient's noncompliance with other medical treatment and regimen: Secondary | ICD-10-CM | POA: Diagnosis not present

## 2015-03-18 DIAGNOSIS — J45909 Unspecified asthma, uncomplicated: Secondary | ICD-10-CM | POA: Diagnosis present

## 2015-03-18 DIAGNOSIS — Z791 Long term (current) use of non-steroidal anti-inflammatories (NSAID): Secondary | ICD-10-CM | POA: Diagnosis not present

## 2015-03-18 DIAGNOSIS — G4733 Obstructive sleep apnea (adult) (pediatric): Secondary | ICD-10-CM | POA: Diagnosis not present

## 2015-03-18 DIAGNOSIS — Z880 Allergy status to penicillin: Secondary | ICD-10-CM | POA: Diagnosis not present

## 2015-03-18 DIAGNOSIS — B349 Viral infection, unspecified: Secondary | ICD-10-CM | POA: Diagnosis not present

## 2015-03-18 DIAGNOSIS — M545 Low back pain, unspecified: Secondary | ICD-10-CM | POA: Diagnosis present

## 2015-03-18 DIAGNOSIS — Z886 Allergy status to analgesic agent status: Secondary | ICD-10-CM

## 2015-03-18 DIAGNOSIS — F419 Anxiety disorder, unspecified: Secondary | ICD-10-CM | POA: Diagnosis present

## 2015-03-18 DIAGNOSIS — Z91013 Allergy to seafood: Secondary | ICD-10-CM

## 2015-03-18 DIAGNOSIS — R938 Abnormal findings on diagnostic imaging of other specified body structures: Secondary | ICD-10-CM | POA: Diagnosis not present

## 2015-03-18 DIAGNOSIS — E785 Hyperlipidemia, unspecified: Secondary | ICD-10-CM | POA: Diagnosis not present

## 2015-03-18 DIAGNOSIS — R0602 Shortness of breath: Secondary | ICD-10-CM | POA: Diagnosis not present

## 2015-03-18 DIAGNOSIS — N189 Chronic kidney disease, unspecified: Secondary | ICD-10-CM | POA: Diagnosis present

## 2015-03-18 DIAGNOSIS — Z91040 Latex allergy status: Secondary | ICD-10-CM

## 2015-03-18 DIAGNOSIS — Z887 Allergy status to serum and vaccine status: Secondary | ICD-10-CM

## 2015-03-18 DIAGNOSIS — H409 Unspecified glaucoma: Secondary | ICD-10-CM | POA: Diagnosis present

## 2015-03-18 DIAGNOSIS — Z79899 Other long term (current) drug therapy: Secondary | ICD-10-CM | POA: Diagnosis not present

## 2015-03-18 DIAGNOSIS — D649 Anemia, unspecified: Secondary | ICD-10-CM | POA: Diagnosis present

## 2015-03-18 DIAGNOSIS — Z881 Allergy status to other antibiotic agents status: Secondary | ICD-10-CM | POA: Diagnosis not present

## 2015-03-18 DIAGNOSIS — I129 Hypertensive chronic kidney disease with stage 1 through stage 4 chronic kidney disease, or unspecified chronic kidney disease: Secondary | ICD-10-CM | POA: Diagnosis present

## 2015-03-18 DIAGNOSIS — Z91041 Radiographic dye allergy status: Secondary | ICD-10-CM | POA: Diagnosis not present

## 2015-03-18 DIAGNOSIS — G8929 Other chronic pain: Secondary | ICD-10-CM | POA: Diagnosis present

## 2015-03-18 DIAGNOSIS — J4520 Mild intermittent asthma, uncomplicated: Secondary | ICD-10-CM | POA: Diagnosis not present

## 2015-03-18 DIAGNOSIS — K219 Gastro-esophageal reflux disease without esophagitis: Secondary | ICD-10-CM | POA: Diagnosis present

## 2015-03-18 DIAGNOSIS — Z809 Family history of malignant neoplasm, unspecified: Secondary | ICD-10-CM

## 2015-03-18 DIAGNOSIS — R05 Cough: Secondary | ICD-10-CM | POA: Diagnosis not present

## 2015-03-18 DIAGNOSIS — I10 Essential (primary) hypertension: Secondary | ICD-10-CM | POA: Diagnosis present

## 2015-03-18 DIAGNOSIS — R042 Hemoptysis: Secondary | ICD-10-CM | POA: Diagnosis not present

## 2015-03-18 DIAGNOSIS — Z86010 Personal history of colonic polyps: Secondary | ICD-10-CM

## 2015-03-18 LAB — BASIC METABOLIC PANEL
Anion gap: 10 (ref 5–15)
BUN: 10 mg/dL (ref 6–20)
CHLORIDE: 105 mmol/L (ref 101–111)
CO2: 26 mmol/L (ref 22–32)
CREATININE: 0.67 mg/dL (ref 0.44–1.00)
Calcium: 9.2 mg/dL (ref 8.9–10.3)
GFR calc Af Amer: >60 mL/min (ref >60)
GFR calc non Af Amer: >60 mL/min (ref >60)
Glucose, Bld: 104 mg/dL — ABNORMAL HIGH (ref 65–99)
POTASSIUM: 4 mmol/L (ref 3.5–5.1)
Sodium: 141 mmol/L (ref 135–145)

## 2015-03-18 LAB — CBC
HEMATOCRIT: 36 % (ref 36.0–46.0)
Hemoglobin: 11.5 g/dL — ABNORMAL LOW (ref 12.0–15.0)
MCH: 27.6 pg (ref 26.0–34.0)
MCHC: 31.9 g/dL (ref 30.0–36.0)
MCV: 86.5 fL (ref 78.0–100.0)
PLATELETS: 223 10*3/uL (ref 150–400)
RBC: 4.16 MIL/uL (ref 3.87–5.11)
RDW: 14.4 % (ref 11.5–15.5)
WBC: 11 10*3/uL — ABNORMAL HIGH (ref 4.0–10.5)

## 2015-03-18 LAB — TROPONIN I: Troponin I: <0.03 ng/mL (ref <0.031)

## 2015-03-18 LAB — LACTIC ACID, PLASMA: Lactic Acid, Venous: 1.9 mmol/L (ref 0.5–2.0)

## 2015-03-18 MED ORDER — MELOXICAM 15 MG PO TABS
15.0000 mg | ORAL_TABLET | Freq: Every day | ORAL | Status: DC
  Administered 2015-03-18 – 2015-03-23 (×6): 15 mg via ORAL
  Filled 2015-03-18 (×6): qty 1

## 2015-03-18 MED ORDER — HYDROCODONE-ACETAMINOPHEN 10-325 MG PO TABS
1.0000 | ORAL_TABLET | Freq: Four times a day (QID) | ORAL | Status: DC | PRN
  Administered 2015-03-19 – 2015-03-23 (×8): 1 via ORAL
  Filled 2015-03-18 (×8): qty 1

## 2015-03-18 MED ORDER — HYDROCODONE-ACETAMINOPHEN 5-325 MG PO TABS
1.0000 | ORAL_TABLET | Freq: Once | ORAL | Status: AC
  Administered 2015-03-18: 1 via ORAL
  Filled 2015-03-18: qty 1

## 2015-03-18 MED ORDER — GUAIFENESIN ER 600 MG PO TB12
600.0000 mg | ORAL_TABLET | Freq: Two times a day (BID) | ORAL | Status: DC
  Administered 2015-03-18 – 2015-03-20 (×4): 600 mg via ORAL
  Filled 2015-03-18 (×5): qty 1

## 2015-03-18 MED ORDER — LINACLOTIDE 290 MCG PO CAPS
290.0000 ug | ORAL_CAPSULE | Freq: Every day | ORAL | Status: DC
  Administered 2015-03-18 – 2015-03-20 (×3): 290 ug via ORAL
  Administered 2015-03-21: 145 ug via ORAL
  Administered 2015-03-22 – 2015-03-23 (×2): 290 ug via ORAL
  Filled 2015-03-18 (×6): qty 1

## 2015-03-18 MED ORDER — BENZONATATE 100 MG PO CAPS
200.0000 mg | ORAL_CAPSULE | Freq: Two times a day (BID) | ORAL | Status: DC | PRN
  Administered 2015-03-19: 200 mg via ORAL
  Filled 2015-03-18: qty 2

## 2015-03-18 MED ORDER — IPRATROPIUM-ALBUTEROL 0.5-2.5 (3) MG/3ML IN SOLN
3.0000 mL | Freq: Once | RESPIRATORY_TRACT | Status: AC
  Administered 2015-03-18: 3 mL via RESPIRATORY_TRACT
  Filled 2015-03-18: qty 3

## 2015-03-18 MED ORDER — HYDROCHLOROTHIAZIDE 25 MG PO TABS: 25.0000 mg | ORAL_TABLET | Freq: Every day | ORAL | Status: DC

## 2015-03-18 MED ORDER — PRAVASTATIN SODIUM 40 MG PO TABS
40.0000 mg | ORAL_TABLET | Freq: Every day | ORAL | Status: DC
  Administered 2015-03-18 – 2015-03-22 (×5): 40 mg via ORAL
  Filled 2015-03-18 (×6): qty 1

## 2015-03-18 MED ORDER — ALPRAZOLAM 0.25 MG PO TABS: 0.2500 mg | ORAL_TABLET | Freq: Three times a day (TID) | ORAL | Status: DC | PRN

## 2015-03-18 MED ORDER — ESCITALOPRAM OXALATE 5 MG PO TABS
5.0000 mg | ORAL_TABLET | Freq: Every day | ORAL | Status: DC
  Administered 2015-03-19 – 2015-03-23 (×5): 5 mg via ORAL
  Filled 2015-03-18 (×5): qty 1

## 2015-03-18 MED ORDER — IPRATROPIUM-ALBUTEROL 0.5-2.5 (3) MG/3ML IN SOLN
3.0000 mL | Freq: Four times a day (QID) | RESPIRATORY_TRACT | Status: DC
  Administered 2015-03-19: 3 mL via RESPIRATORY_TRACT
  Filled 2015-03-18: qty 3

## 2015-03-18 MED ORDER — DEXTROSE 5 % IV SOLN
500.0000 mg | INTRAVENOUS | Status: DC
  Administered 2015-03-19 – 2015-03-21 (×3): 500 mg via INTRAVENOUS
  Filled 2015-03-18 (×3): qty 500

## 2015-03-18 MED ORDER — FUROSEMIDE 40 MG PO TABS
40.0000 mg | ORAL_TABLET | Freq: Every day | ORAL | Status: DC
  Administered 2015-03-18 – 2015-03-23 (×6): 40 mg via ORAL
  Filled 2015-03-18 (×6): qty 1

## 2015-03-18 MED ORDER — DEXTROSE 5 % IV SOLN
500.0000 mg | Freq: Once | INTRAVENOUS | Status: AC
  Administered 2015-03-18: 500 mg via INTRAVENOUS
  Filled 2015-03-18: qty 500

## 2015-03-18 MED ORDER — POTASSIUM CHLORIDE CRYS ER 20 MEQ PO TBCR
20.0000 meq | EXTENDED_RELEASE_TABLET | Freq: Every day | ORAL | Status: DC
  Administered 2015-03-18 – 2015-03-23 (×6): 20 meq via ORAL
  Filled 2015-03-18 (×6): qty 1

## 2015-03-18 MED ORDER — MOMETASONE FURO-FORMOTEROL FUM 100-5 MCG/ACT IN AERO
2.0000 | INHALATION_SPRAY | Freq: Two times a day (BID) | RESPIRATORY_TRACT | Status: DC
  Administered 2015-03-18 – 2015-03-19 (×2): 2 via RESPIRATORY_TRACT
  Filled 2015-03-18: qty 8.8

## 2015-03-18 MED ORDER — DEXTROSE 5 % IV SOLN
1.0000 g | INTRAVENOUS | Status: DC
  Administered 2015-03-19 – 2015-03-21 (×3): 1 g via INTRAVENOUS
  Filled 2015-03-18 (×3): qty 10

## 2015-03-18 MED ORDER — AMLODIPINE BESYLATE 5 MG PO TABS
5.0000 mg | ORAL_TABLET | Freq: Every day | ORAL | Status: DC
  Administered 2015-03-19 – 2015-03-23 (×5): 5 mg via ORAL
  Filled 2015-03-18 (×5): qty 1

## 2015-03-18 MED ORDER — SODIUM CHLORIDE 0.9 % IV SOLN
INTRAVENOUS | Status: AC
  Administered 2015-03-18: 22:00:00 via INTRAVENOUS

## 2015-03-18 MED ORDER — ONDANSETRON HCL 4 MG/2ML IJ SOLN: 4.0000 mg | Freq: Three times a day (TID) | INTRAMUSCULAR | Status: DC | PRN

## 2015-03-18 MED ORDER — BENAZEPRIL HCL 40 MG PO TABS
40.0000 mg | ORAL_TABLET | Freq: Every day | ORAL | Status: DC
  Administered 2015-03-19: 40 mg via ORAL
  Filled 2015-03-18: qty 1

## 2015-03-18 MED ORDER — SODIUM CHLORIDE 0.9 % IV SOLN
INTRAVENOUS | Status: DC
Start: 2015-03-18 — End: 2015-03-19
  Administered 2015-03-19 (×2): via INTRAVENOUS

## 2015-03-18 MED ORDER — PANTOPRAZOLE SODIUM 40 MG PO TBEC
40.0000 mg | DELAYED_RELEASE_TABLET | Freq: Every day | ORAL | Status: DC
  Administered 2015-03-18 – 2015-03-23 (×6): 40 mg via ORAL
  Filled 2015-03-18 (×6): qty 1

## 2015-03-18 MED ORDER — CEFTRIAXONE SODIUM 1 G IJ SOLR
1.0000 g | Freq: Once | INTRAMUSCULAR | Status: AC
  Administered 2015-03-18: 1 g via INTRAVENOUS
  Filled 2015-03-18: qty 10

## 2015-03-18 MED ORDER — TRAVOPROST 0.004 % OP SOLN
1.0000 [drp] | Freq: Every day | OPHTHALMIC | Status: DC
  Filled 2015-03-18: qty 2.5

## 2015-03-18 MED ORDER — LACTULOSE 10 GM/15ML PO SOLN
20.0000 g | Freq: Three times a day (TID) | ORAL | Status: DC | PRN
  Administered 2015-03-19 – 2015-03-23 (×2): 20 g via ORAL
  Filled 2015-03-18 (×5): qty 30

## 2015-03-18 MED ORDER — LATANOPROST 0.005 % OP SOLN
1.0000 [drp] | Freq: Every day | OPHTHALMIC | Status: DC
  Filled 2015-03-18: qty 2.5

## 2015-03-18 MED ORDER — IPRATROPIUM-ALBUTEROL 0.5-2.5 (3) MG/3ML IN SOLN: 3.0000 mL | RESPIRATORY_TRACT | Status: DC | PRN

## 2015-03-18 MED ORDER — ENOXAPARIN SODIUM 40 MG/0.4ML ~~LOC~~ SOLN
40.0000 mg | SUBCUTANEOUS | Status: DC
  Administered 2015-03-18: 40 mg via SUBCUTANEOUS
  Filled 2015-03-18 (×2): qty 0.4

## 2015-03-18 MED ORDER — METOPROLOL TARTRATE 50 MG PO TABS
50.0000 mg | ORAL_TABLET | Freq: Two times a day (BID) | ORAL | Status: DC
  Administered 2015-03-18 – 2015-03-23 (×10): 50 mg via ORAL
  Filled 2015-03-18 (×11): qty 1

## 2015-03-18 MED ORDER — CYCLOBENZAPRINE HCL 10 MG PO TABS: 10.0000 mg | ORAL_TABLET | Freq: Three times a day (TID) | ORAL | Status: DC | PRN

## 2015-03-18 NOTE — ED Provider Notes (Signed)
CSN: [id number]     Arrival date & time 03/18/15  1522 History   First MD Initiated Contact with Patient 03/18/15 1723     Chief Complaint  Patient presents with  . Chest Pain  . Shortness of Breath  . Cough      HPI Patient presents with complaint of fever, chest pain when coughing.  Sputum which is yellow with a few blood streaks in it.  Slight headache.  Patient has previous history of asthma.  Also has hypertension.  No smoking history. Past Medical History  Diagnosis Date  . GERD (gastroesophageal reflux disease)   . Hyperlipidemia   . Low back pain   . Asthma   . Colon polyps   . Hemorrhoids   . Complication of anesthesia     states requires a lot med. to put her to sleep   . Family history of anesthesia complication   . CHF (congestive heart failure) ([place])     pt. unsure- but thinks she was hosp. for CHF- 2002  . [facility] kidney disease     [facility]  . Anxiety   . Depression     "sometimes "  . Shortness of breath   . Sleep apnea     uses c-pap- q night recently  . DDD (degenerative disc disease) 09/17/2011  . Glaucoma     bilateral, pt. admits that she is noncompliant to eye gtts.   . Hypertension     had stress, echo- 2006 /w [place], Cardiac Cath, per pt. 2002, echo repeated 2012- wnl    Past Surgical History  Procedure Laterality Date  . Cardiac catheterization    . Colonoscopy      remote  . Abdominal hysterectomy      ectopic, fibroids  . Foot surgery      bilat, heel spurs- screw in R foot   . Flexible sigmoidoscopy  02/25/2011    Procedure: FLEXIBLE SIGMOIDOSCOPY;  Surgeon: Inda Castle, MD;  [facility];  Service: Endoscopy;  Laterality: N/A;  . Eye surgery      cataracts removed bilateral- ?IOL  . Sphincterotomy  10/08/2011    Procedure: SPHINCTEROTOMY;  Surgeon: Stark Klein, MD;  Location: [place];  Service: General;  Laterality: N/A;  . Fissurectomy  10/08/2011    Procedure: FISSURECTOMY;  Surgeon: Stark Klein, MD;   Location: [place];  Service: General;  Laterality: N/A;  . Hemorrhoid surgery  10/08/2011    Procedure: HEMORRHOIDECTOMY;  Surgeon: Stark Klein, MD;  Location: [place];  Service: General;  Laterality: N/A;  External    Family History  Problem Relation Age of Onset  . Cancer Mother     breast  . Heart attack Mother   . Heart disease Mother   . Cancer Other     breast, colon, lung  . Heart disease Other   . Anesthesia problems Neg Hx   . Emphysema Sister   . Cancer Sister     breast  . Asthma Sister   . Heart disease Sister   . Rheum arthritis Sister   . Cancer Brother     colon   Social History  Substance Use Topics  . Smoking status: Never Smoker   . Smokeless tobacco: Never Used  . Alcohol Use: No   OB History    No data available     Review of Systems  All other systems reviewed and are negative  Allergies  Fish oil; Penicillins; Aspirin; Bactrim; Ciprofloxacin; Ibuprofen; Influenza vaccines;  Ivp dye; Latex; Macrobid; and Shellfish allergy  Home Medications   Prior to Admission medications   Medication Sig Start Date End Date Taking? Authorizing Provider  ALPRAZolam (XANAX) 0.25 MG tablet Take 1 tablet (0.25 mg total) by mouth 3 (three) times daily as needed for sleep. 08/03/14  Yes Marletta Lor, MD  amLODipine (NORVASC) 5 MG tablet Take 1 tablet (5 mg total) by mouth daily. 06/01/14  Yes Marletta Lor, MD  benazepril (LOTENSIN) 40 MG tablet Take 1 tablet (40 mg total) by mouth daily. 07/20/14  Yes Marletta Lor, MD  cyclobenzaprine (FLEXERIL) 10 MG tablet Take 1 tablet (10 mg total) by mouth 3 (three) times daily as needed. 03/07/15  Yes Marletta Lor, MD  escitalopram (LEXAPRO) 5 MG tablet Take 1 tablet (5 mg total) by mouth daily. 03/07/15  Yes Marletta Lor, MD  Fluticasone-Salmeterol (ADVAIR) 250-50 MCG/DOSE AEPB Inhale 1 puff into the lungs every 12 (twelve) hours. 09/10/14  Yes Marletta Lor, MD  furosemide (LASIX) 40 MG tablet  Take 1 tablet (40 mg total) by mouth daily. 09/10/14  Yes Marletta Lor, MD  hydrochlorothiazide (HYDRODIURIL) 25 MG tablet Take 1 tablet (25 mg total) by mouth daily. 07/20/14  Yes Marletta Lor, MD  HYDROcodone-acetaminophen [facility]) 10-325 MG tablet Take 1 tablet by mouth every 6 (six) hours as needed. 03/04/15  Yes Marletta Lor, MD  HYDROcodone-homatropine (HYDROMET) 5-1.5 MG/5ML syrup Take 5 mLs by mouth every 4 (four) hours as needed. 01/16/15  Yes Laurey Morale, MD  lactulose (CHRONULAC) 10 GM/15ML solution Take 30 mLs (20 g total) by mouth 3 (three) times daily as needed for mild constipation. 03/08/15  Yes Marletta Lor, MD  Linaclotide [facility]) 290 MCG CAPS capsule Take 1 capsule (290 mcg total) by mouth daily. 01/16/15  Yes Laurey Morale, MD  lovastatin (MEVACOR) 40 MG tablet Take 2 tablets (80 mg total) by mouth at bedtime. 06/01/14  Yes Marletta Lor, MD  meloxicam (MOBIC) 15 MG tablet Take 1 tablet (15 mg total) by mouth daily. 09/10/14  Yes Marletta Lor, MD  metoprolol (LOPRESSOR) 50 MG tablet Take 1 tablet (50 mg total) by mouth 2 (two) times daily. 09/10/14  Yes Marletta Lor, MD  omeprazole (PRILOSEC) 20 MG capsule TAKE TWO CAPSULES BY MOUTH ONCE DAILY 06/01/14  Yes Marletta Lor, MD  potassium chloride SA (K-DUR,KLOR-CON) 20 MEQ tablet Take 1 tablet (20 mEq total) by mouth daily. 09/10/14  Yes Marletta Lor, MD  albuterol (PROVENTIL HFA;VENTOLIN HFA) 108 (90 BASE) MCG/ACT inhaler Inhale 2 puffs into the lungs every 6 (six) hours as needed. 09/10/14 09/10/15  Marletta Lor, MD  clarithromycin (BIAXIN) 500 MG tablet Take 1 tablet (500 mg total) by mouth 2 (two) times daily. 01/16/15   Laurey Morale, MD  travoprost, benzalkonium, (TRAVATAN) 0.004 % ophthalmic solution Place 1 drop into both eyes at bedtime.    Historical Provider, MD   BP 142/56 mmHg  Pulse 89  Temp(Src) 99.2 F (37.3 C) (Oral)  Resp 23  SpO2 91% Physical  Exam Physical Exam  Nursing note and vitals reviewed. Constitutional: She is oriented to person, place, and time. She appears well-developed and well-nourished. No distress.  HENT:  Head: Normocephalic and atraumatic.  Eyes: Pupils are equal, round, and reactive to light.  Neck: Normal range of motion.  Cardiovascular: Normal rate and intact distal pulses.   Pulmonary/Chest: No respiratory distress.  Scattered expiratory wheezes to auscultation.  Abdominal: Normal appearance. She exhibits no distension.  Musculoskeletal: Normal range of motion.  Neurological: She is alert and oriented to person, place, and time. No cranial nerve deficit.  Skin: Skin is warm and dry. No rash noted.  Psychiatric: She has a normal mood and affect. Her behavior is normal.   ED Course  Procedures (including critical care time) Medications  azithromycin (ZITHROMAX) 500 mg in dextrose 5 % 250 mL IVPB (500 mg Intravenous New Bag/Given 03/18/15 1903)  cefTRIAXone (ROCEPHIN) 1 g in dextrose 5 % 50 mL IVPB (0 g Intravenous Stopped 03/18/15 1913)  HYDROcodone-acetaminophen (NORCO/VICODIN) 5-325 MG per tablet 1 tablet (1 tablet Oral Given 03/18/15 1909)    Labs Review Labs Reviewed  BASIC METABOLIC PANEL - Abnormal; Notable for the following:    Glucose, Bld 104 (*)    All other components within normal limits  CBC - Abnormal; Notable for the following:    WBC 11.0 (*)    Hemoglobin 11.5 (*)    All other components within normal limits  CULTURE, BLOOD (ROUTINE X 2)  CULTURE, BLOOD (ROUTINE X 2)  LACTIC ACID, PLASMA  I-STAT TROPOININ, ED    Imaging Review Dg Chest 2 View  03/18/2015  CLINICAL DATA:  Shortness of breath. Mid to LEFT chest pain for 1 week. Coughing up blood for 4 days. EXAM: CHEST  2 VIEW COMPARISON:  70 13. FINDINGS: Normal cardiac size. Difficult to exclude hilar adenopathy, possibly greater on the RIGHT. BILATERAL perihilar opacities are seen worst in the RIGHT upper lobe, which in the  appropriate clinical setting could represent pneumonia, hemorrhage, or tumor. Trace blunting both CP angles. No osseous findings. IMPRESSION: BILATERAL pulmonary opacities, worst in the RIGHT upper lobe. See discussion above. CT chest with contrast may be helpful in further evaluation of extent and pathology. Electronically Signed   By: Staci Righter M.D.   On: 03/18/2015 16:49   I have personally reviewed and evaluated these images and lab results as part of my medical decision-making.   EKG Interpretation None      MDM   Final diagnoses:  CAP (community acquired pneumonia)        Leonard Schwartz, MD 03/18/15 1958

## 2015-03-18 NOTE — H&P (Addendum)
[facility] History and Physical  Miranda [name] [id number] DOB: 1945/05/27 DOA: 03/18/2015  Referring physician: ED PCP: Nyoka Cowden, MD   Chief Complaint: cough and shortness of breath  HPI:  Miranda [name] is a 70 year old female with a past medical history significant for HTN, HLD, DDD, asthma, anxiety; who presents with complaints of shortness of breath and cough. Patient notes the symptoms started sometime at the end of November where she states that she was treated by her primary care provider for bronchitis along with antibiotics. Symptoms improved initially and then over the last month returned. Coughing symptoms were first. Patient notes a productive cough for which she was tried on Hycodan syrup without relief of symptoms. As the weeks went on she became progressively more short of breath. Following Christmas she notes significant decline for which she started to have intermittent fevers as high as 101F and also reported coughing up blood-tinged sputum. She has a history of asthma in the past but never required regular use of her inhalers. She denies any history of smoking, recent travel, or any other sick contacts.  Associated signs and symptoms of chest tenderness, malaise, and headache.    Review of Systems  Constitutional: Positive for fever and malaise/fatigue.  HENT: Negative for hearing loss.   Eyes: Negative for double vision and photophobia.  Respiratory: Positive for cough, hemoptysis, sputum production, shortness of breath and wheezing.   Cardiovascular: Positive for chest pain. Negative for palpitations and leg swelling.  Gastrointestinal: Negative for nausea and vomiting.  Genitourinary: Negative for urgency and frequency.  Musculoskeletal: Positive for myalgias and back pain. Negative for falls.  Skin: Negative for itching and rash.  Neurological: Positive for headaches. Negative for tremors and sensory change.  Endo/Heme/Allergies: Negative for  polydipsia. Does not bruise/bleed easily.  Psychiatric/Behavioral: Negative for suicidal ideas and substance abuse. The patient is nervous/anxious.        Past Medical History  Diagnosis Date  . GERD (gastroesophageal reflux disease)   . Hyperlipidemia   . Low back pain   . Asthma   . Colon polyps   . Hemorrhoids   . Complication of anesthesia     states requires a lot med. to put her to sleep   . Family history of anesthesia complication   . CHF (congestive heart failure) ([place])     pt. unsure- but thinks she was hosp. for CHF- 2002  . Chronic kidney disease     [facility]  . Anxiety   . Depression     "sometimes "  . Shortness of breath   . Sleep apnea     uses c-pap- q night recently  . DDD (degenerative disc disease) 09/17/2011  . Glaucoma     bilateral, pt. admits that she is noncompliant to eye gtts.   . Hypertension     had stress, echo- 2006 /w [place], Cardiac Cath, per pt. 2002, echo repeated 2012- wnl      Past Surgical History  Procedure Laterality Date  . Cardiac catheterization    . Colonoscopy      remote  . Abdominal hysterectomy      ectopic, fibroids  . Foot surgery      bilat, heel spurs- screw in R foot   . Flexible sigmoidoscopy  02/25/2011    Procedure: FLEXIBLE SIGMOIDOSCOPY;  Surgeon: Inda Castle, MD;  [facility];  Service: Endoscopy;  Laterality: N/A;  . Eye surgery      cataracts removed bilateral- ?IOL  .  Sphincterotomy  10/08/2011    Procedure: SPHINCTEROTOMY;  Surgeon: Stark Klein, MD;  Location: [place];  Service: General;  Laterality: N/A;  . Fissurectomy  10/08/2011    Procedure: FISSURECTOMY;  Surgeon: Stark Klein, MD;  Location: [place];  Service: General;  Laterality: N/A;  . Hemorrhoid surgery  10/08/2011    Procedure: HEMORRHOIDECTOMY;  Surgeon: Stark Klein, MD;  Location: [place];  Service: General;  Laterality: N/A;  External       Social History:  reports that she has never smoked. She has never used  smokeless tobacco. She reports that she does not drink alcohol or use illicit drugs. Where does patient live--home Can patient participate in ADLs? Yes   Allergies  Allergen Reactions  . Fish Oil Anaphylaxis  . Penicillins Anaphylaxis  . Pneumococcal Vaccines Nausea And Vomiting  . Aspirin Itching and Rash  . Bactrim [Sulfamethoxazole-Trimethoprim] Hives, Itching and Rash  . Ciprofloxacin Hives, Itching and Rash  . Ibuprofen Rash    REACTION: rash  . Influenza Vaccines Hives  . Ivp Dye [Iodinated Diagnostic Agents] Hives, Itching and Rash    Gives benadryl to counteract symptoms  . Latex Rash  . Macrobid [Nitrofurantoin Monohydrate Macrocrystals] Hives  . Shellfish Allergy Hives    Patient also allergic to seafood    Family History  Problem Relation Age of Onset  . Cancer Mother     breast  . Heart attack Mother   . Heart disease Mother   . Cancer Other     breast, colon, lung  . Heart disease Other   . Anesthesia problems Neg Hx   . Emphysema Sister   . Cancer Sister     breast  . Asthma Sister   . Heart disease Sister   . Rheum arthritis Sister   . Cancer Brother     colon       Prior to Admission medications   Medication Sig Start Date End Date Taking? Authorizing Provider  ALPRAZolam (XANAX) 0.25 MG tablet Take 1 tablet (0.25 mg total) by mouth 3 (three) times daily as needed for sleep. 08/03/14  Yes Marletta Lor, MD  amLODipine (NORVASC) 5 MG tablet Take 1 tablet (5 mg total) by mouth daily. 06/01/14  Yes Marletta Lor, MD  benazepril (LOTENSIN) 40 MG tablet Take 1 tablet (40 mg total) by mouth daily. 07/20/14  Yes Marletta Lor, MD  cyclobenzaprine (FLEXERIL) 10 MG tablet Take 1 tablet (10 mg total) by mouth 3 (three) times daily as needed. 03/07/15  Yes Marletta Lor, MD  escitalopram (LEXAPRO) 5 MG tablet Take 1 tablet (5 mg total) by mouth daily. 03/07/15  Yes Marletta Lor, MD  Fluticasone-Salmeterol (ADVAIR) 250-50 MCG/DOSE  AEPB Inhale 1 puff into the lungs every 12 (twelve) hours. 09/10/14  Yes Marletta Lor, MD  furosemide (LASIX) 40 MG tablet Take 1 tablet (40 mg total) by mouth daily. 09/10/14  Yes Marletta Lor, MD  hydrochlorothiazide (HYDRODIURIL) 25 MG tablet Take 1 tablet (25 mg total) by mouth daily. 07/20/14  Yes Marletta Lor, MD  HYDROcodone-acetaminophen [facility]) 10-325 MG tablet Take 1 tablet by mouth every 6 (six) hours as needed. 03/04/15  Yes Marletta Lor, MD  HYDROcodone-homatropine (HYDROMET) 5-1.5 MG/5ML syrup Take 5 mLs by mouth every 4 (four) hours as needed. 01/16/15  Yes Laurey Morale, MD  lactulose (CHRONULAC) 10 GM/15ML solution Take 30 mLs (20 g total) by mouth 3 (three) times daily as needed for mild constipation. 03/08/15  Yes Marletta Lor, MD  Linaclotide [facility]) 290 MCG CAPS capsule Take 1 capsule (290 mcg total) by mouth daily. 01/16/15  Yes Laurey Morale, MD  lovastatin (MEVACOR) 40 MG tablet Take 2 tablets (80 mg total) by mouth at bedtime. 06/01/14  Yes Marletta Lor, MD  meloxicam (MOBIC) 15 MG tablet Take 1 tablet (15 mg total) by mouth daily. 09/10/14  Yes Marletta Lor, MD  metoprolol (LOPRESSOR) 50 MG tablet Take 1 tablet (50 mg total) by mouth 2 (two) times daily. 09/10/14  Yes Marletta Lor, MD  omeprazole (PRILOSEC) 20 MG capsule TAKE TWO CAPSULES BY MOUTH ONCE DAILY 06/01/14  Yes Marletta Lor, MD  potassium chloride SA (K-DUR,KLOR-CON) 20 MEQ tablet Take 1 tablet (20 mEq total) by mouth daily. 09/10/14  Yes Marletta Lor, MD  albuterol (PROVENTIL HFA;VENTOLIN HFA) 108 (90 BASE) MCG/ACT inhaler Inhale 2 puffs into the lungs every 6 (six) hours as needed. 09/10/14 09/10/15  Marletta Lor, MD  clarithromycin (BIAXIN) 500 MG tablet Take 1 tablet (500 mg total) by mouth 2 (two) times daily. 01/16/15   Laurey Morale, MD  travoprost, benzalkonium, (TRAVATAN) 0.004 % ophthalmic solution Place 1 drop into both eyes at bedtime.     Historical Provider, MD     Physical Exam: Filed Vitals:   03/18/15 1751 03/18/15 1942 03/18/15 1943 03/18/15 1944  BP: 177/69 142/56 142/56   Pulse: 86 84 80 89  Temp:      TempSrc:      Resp: 29 22 25 23   SpO2: 92% 93% 88% 91%     Constitutional: Vital signs reviewed. Patient is a elderly female who appears to be in some mild distress coughing at bedside.  Head: Normocephalic and atraumatic  Ear: TM normal bilaterally  Mouth: no erythema or exudates, MMM  Eyes: PERRL, EOMI, conjunctivae normal, No scleral icterus.  Neck: Supple, Trachea midline normal ROM, No JVD, mass, thyromegaly, or carotid bruit present.  Cardiovascular: RRR, S1 normal, S2 normal, no MRG, pulses symmetric and intact bilaterally  Pulmonary/Chest: Mildly tachypneic, with decreased aeration, no significant wheezes appreciated Abdominal: Soft. Non-tender, non-distended, bowel sounds are normal, no masses, organomegaly, or guarding present.  GU: no CVA tenderness Musculoskeletal: No joint deformities, erythema, or stiffness, ROM full and no nontender Ext: no edema and no cyanosis, pulses palpable bilaterally (DP and PT)  Hematology: no cervical, inginal, or axillary adenopathy.  Neurological: A&O x3, Strenght is normal and symmetric bilaterally, cranial nerve II-XII are grossly intact, no focal motor deficit, sensory intact to light touch bilaterally.  Skin: Warm, dry and intact. No rash, cyanosis, or clubbing.  Psychiatric: Normal mood and affect. speech and behavior is normal. Judgment and thought content normal. Cognition and memory are normal.      Data Review   Micro Results No results found for this or any previous visit (from the past 240 hour(s)).  Radiology Reports Dg Chest 2 View  03/18/2015  CLINICAL DATA:  Shortness of breath. Mid to LEFT chest pain for 1 week. Coughing up blood for 4 days. EXAM: CHEST  2 VIEW COMPARISON:  70 13. FINDINGS: Normal cardiac size. Difficult to exclude hilar  adenopathy, possibly greater on the RIGHT. BILATERAL perihilar opacities are seen worst in the RIGHT upper lobe, which in the appropriate clinical setting could represent pneumonia, hemorrhage, or tumor. Trace blunting both CP angles. No osseous findings. IMPRESSION: BILATERAL pulmonary opacities, worst in the RIGHT upper lobe. See discussion above. CT chest with contrast may  be helpful in further evaluation of extent and pathology. Electronically Signed   By: Staci Righter M.D.   On: 03/18/2015 16:49     CBC  Recent Labs Lab 03/18/15 1622  WBC 11.0*  HGB 11.5*  HCT 36.0  PLT 223  MCV 86.5  MCH 27.6  MCHC 31.9  RDW 14.4    Chemistries   Recent Labs Lab 03/18/15 1622  NA 141  K 4.0  CL 105  CO2 26  GLUCOSE 104*  BUN 10  CREATININE 0.67  CALCIUM 9.2   ------------------------------------------------------------------------------------------------------------------ CrCl cannot be calculated (Unknown ideal weight.). ------------------------------------------------------------------------------------------------------------------ No results for input(s): HGBA1C in the last 72 hours. ------------------------------------------------------------------------------------------------------------------ No results for input(s): CHOL, HDL, LDLCALC, TRIG, CHOLHDL, LDLDIRECT in the last 72 hours. ------------------------------------------------------------------------------------------------------------------ No results for input(s): TSH, T4TOTAL, T3FREE, THYROIDAB in the last 72 hours.  Invalid input(s): FREET3 ------------------------------------------------------------------------------------------------------------------ No results for input(s): VITAMINB12, FOLATE, FERRITIN, TIBC, IRON, RETICCTPCT in the last 72 hours.  Coagulation profile No results for input(s): INR, PROTIME in the last 168 hours.  No results for input(s): DDIMER in the last 72 hours.  Cardiac Enzymes No  results for input(s): CKMB, TROPONINI, MYOGLOBIN in the last 168 hours.  Invalid input(s): CK ------------------------------------------------------------------------------------------------------------------ Invalid input(s): POCBNP   CBG: No results for input(s): GLUCAP in the last 168 hours.        Assessment/Plan Active Problems:    CAP (community acquired pneumonia): Acute. Patient presents with a productive cough, fever reported up to 101F, WBC 11, mildly decreased oxygen saturation, and bilateral infiltrate on chest x-ray , most likely community-acquired pneumonia.  Other etiologies include versus URI. - Ceftriaxone and azithromycin antibiotics empirically - NS @ 75cc/hr - Fever control prn tylenol - Repeat CBC in am - Sputum cultures - Blood cultures - Duonebs scheduled every 6 hr /PRN SOB/Wheezing  - mucinex/ Tessalon Perles - May consider 6 minute walk prior to discharge home as patient was mildly hypoxic89% on room air on admission  Asthma: Patient with no previous history of tobacco abuse.  -Pharmacy substitution for Advair to [facility]   Hypertension: Stable. Patient notes not taking medications that she is advised - Continue metoprolol, furosemide, benazepril, amlodipine, - Held HCTZ (question double diuretic) as patient notes not taking medications as advised  Anemia: Hemoglobin noted to be 11.5 with normal MCV and MCH. - Continue to monitor  Hyperlipidemia - Pharmacy substitution for lovastatin to pravastatin  Anxiety - Continue home Xanax  And Lexapro   Chronic back pain - Continue hydrocodone  Gerd - Pharmacy substitution for omeprazole to Protonix    DVT prophylaxis Lovenox  Code Status:   full Family Communication: bedside Disposition Plan: admit   Total time spent 55 minutes.Greater than 50% of this time was spent in counseling, explanation of diagnosis, planning of further management, and coordination of care  [facility] Pager [phone]  If 7PM-7AM, please contact night-coverage [email or web address] Password [email or web address] 03/18/2015, 8:15 PM

## 2015-03-18 NOTE — ED Notes (Signed)
Pt c/o chest pain, SOB, cough with blood sputum, headache.

## 2015-03-19 ENCOUNTER — Encounter (HOSPITAL_COMMUNITY): Payer: Self-pay | Admitting: Radiology

## 2015-03-19 ENCOUNTER — Inpatient Hospital Stay (HOSPITAL_COMMUNITY): Payer: Medicare Other

## 2015-03-19 DIAGNOSIS — R05 Cough: Secondary | ICD-10-CM

## 2015-03-19 DIAGNOSIS — J189 Pneumonia, unspecified organism: Principal | ICD-10-CM

## 2015-03-19 DIAGNOSIS — R938 Abnormal findings on diagnostic imaging of other specified body structures: Secondary | ICD-10-CM

## 2015-03-19 DIAGNOSIS — I10 Essential (primary) hypertension: Secondary | ICD-10-CM

## 2015-03-19 LAB — CBC
HCT: 31.7 % — ABNORMAL LOW (ref 36.0–46.0)
HEMOGLOBIN: 10 g/dL — AB (ref 12.0–15.0)
MCH: 27.5 pg (ref 26.0–34.0)
MCHC: 31.5 g/dL (ref 30.0–36.0)
MCV: 87.1 fL (ref 78.0–100.0)
Platelets: 200 10*3/uL (ref 150–400)
RBC: 3.64 MIL/uL — AB (ref 3.87–5.11)
RDW: 14.5 % (ref 11.5–15.5)
WBC: 6.5 10*3/uL (ref 4.0–10.5)

## 2015-03-19 LAB — COMPREHENSIVE METABOLIC PANEL
ALT: 20 U/L (ref 14–54)
ANION GAP: 5 (ref 5–15)
AST: 18 U/L (ref 15–41)
Albumin: 3.1 g/dL — ABNORMAL LOW (ref 3.5–5.0)
Alkaline Phosphatase: 39 U/L (ref 38–126)
BUN: 11 mg/dL (ref 6–20)
CHLORIDE: 110 mmol/L (ref 101–111)
CO2: 26 mmol/L (ref 22–32)
Calcium: 8.7 mg/dL — ABNORMAL LOW (ref 8.9–10.3)
Creatinine, Ser: 0.65 mg/dL (ref 0.44–1.00)
GFR calc non Af Amer: >60 mL/min (ref >60)
Glucose, Bld: 102 mg/dL — ABNORMAL HIGH (ref 65–99)
Potassium: 4 mmol/L (ref 3.5–5.1)
SODIUM: 141 mmol/L (ref 135–145)
Total Bilirubin: 0.9 mg/dL (ref 0.3–1.2)
Total Protein: 6.8 g/dL (ref 6.5–8.1)

## 2015-03-19 LAB — INFLUENZA PANEL BY PCR (TYPE A & B)
H1N1FLUPCR: NOT DETECTED
INFLAPCR: NEGATIVE
Influenza B By PCR: NEGATIVE

## 2015-03-19 LAB — EXPECTORATED SPUTUM ASSESSMENT W GRAM STAIN, RFLX TO RESP C: Special Requests: NORMAL

## 2015-03-19 LAB — STREP PNEUMONIAE URINARY ANTIGEN: STREP PNEUMO URINARY ANTIGEN: NEGATIVE

## 2015-03-19 LAB — EXPECTORATED SPUTUM ASSESSMENT W REFEX TO RESP CULTURE

## 2015-03-19 MED ORDER — IPRATROPIUM-ALBUTEROL 0.5-2.5 (3) MG/3ML IN SOLN
3.0000 mL | Freq: Three times a day (TID) | RESPIRATORY_TRACT | Status: DC
  Administered 2015-03-19 – 2015-03-23 (×13): 3 mL via RESPIRATORY_TRACT
  Filled 2015-03-19 (×13): qty 3

## 2015-03-19 MED ORDER — SODIUM CHLORIDE 0.9 % IV SOLN
INTRAVENOUS | Status: AC
  Administered 2015-03-19: 22:00:00 via INTRAVENOUS

## 2015-03-19 MED ORDER — ENSURE ENLIVE PO LIQD
237.0000 mL | Freq: Two times a day (BID) | ORAL | Status: DC
  Administered 2015-03-19 – 2015-03-23 (×7): 237 mL via ORAL

## 2015-03-19 MED ORDER — LOSARTAN POTASSIUM 50 MG PO TABS
50.0000 mg | ORAL_TABLET | Freq: Every day | ORAL | Status: DC
  Administered 2015-03-19 – 2015-03-23 (×5): 50 mg via ORAL
  Filled 2015-03-19 (×5): qty 1

## 2015-03-19 NOTE — Consult Note (Signed)
PULMONARY / CRITICAL CARE MEDICINE   Name: Miranda [name] MRN: [id number] DOB: 08/28/1945    ADMISSION DATE:  03/18/2015 CONSULTATION DATE:  03/19/2015  REFERRING MD:  Dr. Algis Liming with [facility]  CHIEF COMPLAINT:  Shortness of breath, cough  HISTORY OF PRESENT ILLNESS:   This is a pleasant lifelong nonsmoking female who 70 years old who is admitted to [facility] yesterday for cough, shortness of breath, and some chest pain. She tells me that in November she had what felt like a cold. Specifically, she had sinus symptoms, headache, body aches, and felt febrile and developed a cough. Her symptoms were associated with mucus production. She was seen by her primary care physician and was prescribed an antibiotic, she thinks Z-Pak but she's not sure. Her symptoms initially improved a little bit but she continued to have cough throughout the month of December and around Christmastime. Not long after Christmas she started having progressive shortness of breath, right-sided chest tightness, a fever at home, and cough with worsening mucus production. She also coughed up at least a tablespoon of blood a few days ago. This is occurred once or twice since then. She was admitted to the hospital yesterday with a diagnosis of community-acquired pneumonia after an infiltrate was seen in the right chest. She notes that she has lost a little bit of weight during this time. She has never smoked cigarettes. 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 She tells me that she's got 2 sisters with asthma and she's had significant cancer in the family as well. Specifically, she's had 3 brothers and sisters developed various forms of cancer.  PAST MEDICAL HISTORY :  She  has a past medical history of GERD (gastroesophageal reflux disease); Hyperlipidemia; Low back pain; Asthma; Colon polyps; Hemorrhoids; Complication of anesthesia; Family history of  anesthesia complication; CHF (congestive heart failure) ([place]); Chronic kidney disease; Anxiety; Depression; Shortness of breath; Sleep apnea; DDD (degenerative disc disease) (09/17/2011); Glaucoma; and Hypertension.  PAST SURGICAL HISTORY: She  has past surgical history that includes Cardiac catheterization; Colonoscopy; Abdominal hysterectomy; Foot surgery; Flexible sigmoidoscopy (02/25/2011); Eye surgery; Sphincterotomy (10/08/2011); Fissurectomy (10/08/2011); and Hemorrhoid surgery (10/08/2011).  Allergies  Allergen Reactions  . Fish Oil Anaphylaxis  . Other Hives, Shortness Of Breath and Swelling    Allergic to cashew nuts and peanut oil.  Marland Kitchen Penicillins Anaphylaxis  . Pneumococcal Vaccines Nausea And Vomiting  . Aspirin Itching and Rash  . Bactrim [Sulfamethoxazole-Trimethoprim] Hives, Itching and Rash  . Ciprofloxacin Hives, Itching and Rash  . Ibuprofen Rash    REACTION: rash  . Influenza Vaccines Hives  . Ivp Dye [Iodinated Diagnostic Agents] Hives, Itching and Rash    Gives benadryl to counteract symptoms  . Latex Rash  . Macrobid [Nitrofurantoin Monohydrate Macrocrystals] Hives  . Shellfish Allergy Hives    Patient also allergic to seafood    No current facility-administered medications on file prior to encounter.   Current Outpatient Prescriptions on File Prior to Encounter  Medication Sig  . ALPRAZolam (XANAX) 0.25 MG tablet Take 1 tablet (0.25 mg total) by mouth 3 (three) times daily as needed for sleep.  Marland Kitchen amLODipine (NORVASC) 5 MG tablet Take 1 tablet (5 mg total) by mouth daily.  . benazepril (LOTENSIN) 40 MG tablet Take 1 tablet (40 mg total) by mouth daily.  . cyclobenzaprine (FLEXERIL) 10 MG tablet Take 1 tablet (10 mg total) by mouth 3 (three) times daily as needed.  Marland Kitchen  escitalopram (LEXAPRO) 5 MG tablet Take 1 tablet (5 mg total) by mouth daily.  . Fluticasone-Salmeterol (ADVAIR) 250-50 MCG/DOSE AEPB Inhale 1 puff into the lungs every 12 (twelve) hours.  . furosemide  (LASIX) 40 MG tablet Take 1 tablet (40 mg total) by mouth daily.  . hydrochlorothiazide (HYDRODIURIL) 25 MG tablet Take 1 tablet (25 mg total) by mouth daily.  Marland Kitchen HYDROcodone-acetaminophen (NORCO) 10-325 MG tablet Take 1 tablet by mouth every 6 (six) hours as needed.  Marland Kitchen HYDROcodone-homatropine (HYDROMET) 5-1.5 MG/5ML syrup Take 5 mLs by mouth every 4 (four) hours as needed.  . lactulose (CHRONULAC) 10 GM/15ML solution Take 30 mLs (20 g total) by mouth 3 (three) times daily as needed for mild constipation.  . Linaclotide (LINZESS) 290 MCG CAPS capsule Take 1 capsule (290 mcg total) by mouth daily.  Marland Kitchen lovastatin (MEVACOR) 40 MG tablet Take 2 tablets (80 mg total) by mouth at bedtime.  . meloxicam (MOBIC) 15 MG tablet Take 1 tablet (15 mg total) by mouth daily.  . metoprolol (LOPRESSOR) 50 MG tablet Take 1 tablet (50 mg total) by mouth 2 (two) times daily.  Marland Kitchen omeprazole (PRILOSEC) 20 MG capsule TAKE TWO CAPSULES BY MOUTH ONCE DAILY  . potassium chloride SA (K-DUR,KLOR-CON) 20 MEQ tablet Take 1 tablet (20 mEq total) by mouth daily.  Marland Kitchen albuterol (PROVENTIL HFA;VENTOLIN HFA) 108 (90 BASE) MCG/ACT inhaler Inhale 2 puffs into the lungs every 6 (six) hours as needed.  . clarithromycin (BIAXIN) 500 MG tablet Take 1 tablet (500 mg total) by mouth 2 (two) times daily.  . travoprost, benzalkonium, (TRAVATAN) 0.004 % ophthalmic solution Place 1 drop into both eyes at bedtime.    FAMILY HISTORY:  Her indicated that her mother is deceased. She indicated that her father is deceased.   SOCIAL HISTORY: She  reports that she has never smoked. She has never used smokeless tobacco. She reports that she does not drink alcohol or use illicit drugs.  REVIEW OF SYSTEMS:   Gen: Denies fever, chills, + weight change, fatigue, night sweats HEENT: Denies blurred vision, double vision, hearing loss, tinnitus, sinus congestion, rhinorrhea, sore throat, neck stiffness, dysphagia PULM: per HPI CV: + chest pain, denies edema,  orthopnea, paroxysmal nocturnal dyspnea, palpitations GI: Denies abdominal pain, nausea, vomiting, diarrhea, hematochezia, melena, constipation, change in bowel habits GU: Denies dysuria, hematuria, polyuria, oliguria, urethral discharge Endocrine: Denies hot or cold intolerance, polyuria, polyphagia or appetite change Derm: Denies rash, dry skin, scaling or peeling skin change Heme: Denies easy bruising, bleeding, bleeding gums Neuro: Denies headache, numbness, weakness, slurred speech, loss of memory or consciousness   SUBJECTIVE:  As above  VITAL SIGNS: BP 131/59 mmHg  Pulse 58  Temp(Src) 98 F (36.7 C) (Oral)  Resp 18  Ht 5\' 5"  (1.651 m)  Wt 94.348 kg (208 lb)  BMI 34.61 kg/m2  SpO2 100%  HEMODYNAMICS:    VENTILATOR SETTINGS:    INTAKE / OUTPUT:    PHYSICAL EXAMINATION: General:  Well appearing, in bed Neuro:  Awake alert, oriented 4, moves all 4 extremities well HEENT:  Normocephalic atraumatic, oropharynx clear, extraocular movements intact Cardiovascular:  Regular rate and rhythm, no clear murmurs gallops or rubs on my exam, cannot assess JVD Lungs:  Inspiratory crackles, egophony, and mild wheezing on the right side, clear to auscultation in the left Abdomen:  Bowel sounds positive, nontender nondistended Musculoskeletal:  No bony deformities, normal muscle bulk and tone Skin:  No rash or skin breakdown  LABS:  BMET  Recent Labs Lab  03/18/15 1622 03/19/15 0520  NA 141 141  K 4.0 4.0  CL 105 110  CO2 26 26  BUN 10 11  CREATININE 0.67 0.65  GLUCOSE 104* 102*    Electrolytes  Recent Labs Lab 03/18/15 1622 03/19/15 0520  CALCIUM 9.2 8.7*    CBC  Recent Labs Lab 03/18/15 1622 03/19/15 0520  WBC 11.0* 6.5  HGB 11.5* 10.0*  HCT 36.0 31.7*  PLT 223 200    Coag's No results for input(s): APTT, INR in the last 168 hours.  Sepsis Markers  Recent Labs Lab 03/18/15 1820  LATICACIDVEN 1.9    ABG No results for input(s): PHART,  PCO2ART, PO2ART in the last 168 hours.  Liver Enzymes  Recent Labs Lab 03/19/15 0520  AST 18  ALT 20  ALKPHOS 39  BILITOT 0.9  ALBUMIN 3.1*    Cardiac Enzymes  Recent Labs Lab 03/18/15 2200  TROPONINI <0.03    Glucose No results for input(s): GLUCAP in the last 168 hours.  Imaging Dg Chest 2 View  03/18/2015  CLINICAL DATA:  Shortness of breath. Mid to LEFT chest pain for 1 week. Coughing up blood for 4 days. EXAM: CHEST  2 VIEW COMPARISON:  70 13. FINDINGS: Normal cardiac size. Difficult to exclude hilar adenopathy, possibly greater on the RIGHT. BILATERAL perihilar opacities are seen worst in the RIGHT upper lobe, which in the appropriate clinical setting could represent pneumonia, hemorrhage, or tumor. Trace blunting both CP angles. No osseous findings. IMPRESSION: BILATERAL pulmonary opacities, worst in the RIGHT upper lobe. See discussion above. CT chest with contrast may be helpful in further evaluation of extent and pathology. Electronically Signed   By: Staci Righter M.D.   On: 03/18/2015 16:49     STUDIES:  03/19/2015 CT chest results pending  CULTURES: 03/18/2015 blood culture 03/19/2015 sputum culture  ANTIBIOTICS: 03/18/2015 ceftriaxone 03/18/2015 azithromycin  SIGNIFICANT EVENTS: January 2 admitted  LINES/TUBES: None  DISCUSSION: This is a 70 year old female who has community-acquired pneumonia after several weeks of a cough in the setting of an initial viral illness. She had an abrupt worsening over the last several days with chest tightness, pain, fever, mucopurulent sputum production. She has a right-sided infiltrate on physical exam. Now, this entire picture is consistent with community-acquired pneumonia. However, I am concerned about the hemoptysis she had and the somewhat unusual appearing chest x-ray. Presumptively, she just has a superior segment of the right lower lobe infiltrate, but she does not have classic silhouetting or air bronchograms and  she does appear to have some mediastinal lymphadenopathy. I think the best approach at this point would be to get a CT scan of her chest to ensure there is no evidence of a lung mass. I also think her cough may be perpetuated by the ACE inhibitor.  ASSESSMENT / PLAN:  PULMONARY A: Community-acquired pneumonia Persistent cough Abnormal chest x-ray P:   CT chest now Stop Dulera Continue Tessalon as needed for cough Continue ceftriaxone and azithromycin Stop benazepril Start losartan   CARDIOVASCULAR A:  Hypertension P:  Stop benazepril due to cough Start losartan, repeat basic metabolic panel tomorrow  Rest per [facility]  Roselie Awkward, MD [facility] Pager: [phone] Cell: [phone] After 3pm or if no response, call [phone]   03/19/2015, 12:36 PM

## 2015-03-19 NOTE — Progress Notes (Signed)
PROGRESS NOTE    Miranda [name] [id number] DOB: 1945-08-07 DOA: 03/18/2015 PCP: Nyoka Cowden, MD  HPI/Brief narrative 70 year old with history of HTN, HLD, asthma, anxiety, lives with daughter, has never smoked but exposed to heavy passive smoking from daughter, treated for acute bronchitis with antibiotics in November, transiently improved and has been progressively worsening with productive cough, dyspnea, intermittent fevers, blood-tinged sputum. Admitted for evaluation and management of possible community-acquired pneumonia. CCM consulted given significantly abnormal chest x-ray.   Assessment/Plan:  Community-acquired pneumonia - Empirically started on IV Rocephin and azithromycin - Given her protracted presentation and abnormal chest x-ray, CCM was consulted. - CT chest was obtained which shows usual interstitial pneumonia and possible alveolar hemorrhage - CCM have stopped benazepril and Dulera - Supportive treatment with oxygen, bronchodilators.  Essential hypertension - DC benazepril due to cough. Started losartan. Continue metoprolol and amlodipine. HCTZ also held.  Asthma - Appears stable. No clinical bronchospasm.  Anemia  - follow CBCs.  Hyperlipidemia  - Statins   Anxiety - Xanax and Lexapro  Chronic back pain - Controlled  GERD - Protonix   DVT prophylaxis: DC Lovenox given hemodialysis history. SCDs.  Code Status: Full Family Communication: None at bedside Disposition Plan: DC home when medically stable   Consultants:  PCCM  Procedures:  None  Antibiotics:  IV Rocephin & Azithromycin  Subjective: Feels slightly better. Still dyspneic with minimal exertion. Minimal dry cough without hemoptysis since admission. No chest pain reported.   Objective: Filed Vitals:   03/19/15 0152 03/19/15 0528 03/19/15 1010 03/19/15 1450  BP:  131/59    Pulse:  58    Temp:  98 F (36.7 C)    TempSrc:  Oral    Resp:  18    Height:        Weight:      SpO2: 95% 100% 100% 96%    Intake/Output Summary (Last 24 hours) at 03/19/15 1827 Last data filed at 03/19/15 1344  Gross per 24 hour  Intake      0 ml  Output      2 ml  Net     -2 ml   Filed Weights   03/18/15 2000  Weight: 94.348 kg (208 lb)     Exam:  General exam: Moderately built and nourished pleasant middle-aged female, slightly ill looking, lying comfortably propped up in bed without distress.  Respiratory system: reduced breath sounds bilaterally, bronchial breath sounds left upper lobe, scattered bilateral basal few crackles. No wheezing or rhonchi. No increased work of breathing. Cardiovascular system: S1 & S2 heard, RRR. No JVD, murmurs, gallops, clicks or pedal edema. telemetry: SB in the 50s-SR  Gastrointestinal system: Abdomen is nondistended, soft and nontender. Normal bowel sounds heard. Central nervous system: Alert and oriented. No focal neurological deficits. Extremities: Symmetric 5 x 5 power.   Data Reviewed: Basic Metabolic Panel:  Recent Labs Lab 03/18/15 1622 03/19/15 0520  NA 141 141  K 4.0 4.0  CL 105 110  CO2 26 26  GLUCOSE 104* 102*  BUN 10 11  CREATININE 0.67 0.65  CALCIUM 9.2 8.7*   Liver Function Tests:  Recent Labs Lab 03/19/15 0520  AST 18  ALT 20  ALKPHOS 39  BILITOT 0.9  PROT 6.8  ALBUMIN 3.1*   No results for input(s): LIPASE, AMYLASE in the last 168 hours. No results for input(s): AMMONIA in the last 168 hours. CBC:  Recent Labs Lab 03/18/15 1622 03/19/15 0520  WBC 11.0* 6.5  HGB 11.5* 10.0*  HCT 36.0 31.7*  MCV 86.5 87.1  PLT 223 200   Cardiac Enzymes:  Recent Labs Lab 03/18/15 2200  TROPONINI <0.03   BNP (last 3 results) No results for input(s): PROBNP in the last 8760 hours. CBG: No results for input(s): GLUCAP in the last 168 hours.  Recent Results (from the past 240 hour(s))  Culture, blood (Routine X 2) w Reflex to ID Panel     Status: None (Preliminary result)   Collection  Time: 03/18/15  6:01 PM  Result Value Ref Range Status   Specimen Description LEFT ANTECUBITAL  Final   Special Requests BOTTLES DRAWN AEROBIC AND ANAEROBIC 5CC  Final   Culture   Final    NO GROWTH < 24 HOURS Performed at [facility]    Report Status PENDING  Incomplete  Culture, blood (Routine X 2) w Reflex to ID Panel     Status: None (Preliminary result)   Collection Time: 03/18/15  6:20 PM  Result Value Ref Range Status   Specimen Description BLOOD RIGHT HAND  Final   Special Requests IN PEDIATRIC BOTTLE 3CC  Final   Culture   Final    NO GROWTH < 24 HOURS Performed at [facility]    Report Status PENDING  Incomplete  Culture, sputum-assessment     Status: None   Collection Time: 03/19/15  6:19 AM  Result Value Ref Range Status   Specimen Description SPUTUM  Final   Special Requests Normal  Final   Sputum evaluation   Final    MICROSCOPIC FINDINGS SUGGEST THAT THIS SPECIMEN IS NOT REPRESENTATIVE OF LOWER RESPIRATORY SECRETIONS. PLEASE RECOLLECT. NOTIFIED RN AT [phone] ON 1.3.17 BY SHUEA    Report Status 03/19/2015 FINAL  Final           Studies: Dg Chest 2 View  03/18/2015  CLINICAL DATA:  Shortness of breath. Mid to LEFT chest pain for 1 week. Coughing up blood for 4 days. EXAM: CHEST  2 VIEW COMPARISON:  70 13. FINDINGS: Normal cardiac size. Difficult to exclude hilar adenopathy, possibly greater on the RIGHT. BILATERAL perihilar opacities are seen worst in the RIGHT upper lobe, which in the appropriate clinical setting could represent pneumonia, hemorrhage, or tumor. Trace blunting both CP angles. No osseous findings. IMPRESSION: BILATERAL pulmonary opacities, worst in the RIGHT upper lobe. See discussion above. CT chest with contrast may be helpful in further evaluation of extent and pathology. Electronically Signed   By: Staci Righter M.D.   On: 03/18/2015 16:49   Ct Chest Wo Contrast  03/19/2015  CLINICAL DATA:  70 year old female with history of  community acquired pneumonia. Intermittent hemoptysis for 1 week. Shortness of breath. Nonsmoker. EXAM: CT CHEST WITHOUT CONTRAST TECHNIQUE: Multidetector CT imaging of the chest was performed following the standard protocol without IV contrast. COMPARISON:  No prior chest CT.  Chest x-ray 03/18/2015. FINDINGS: Mediastinum/Lymph Nodes: Heart size is mildly enlarged. There is no significant pericardial fluid, thickening or pericardial calcification. There is atherosclerosis of the thoracic aorta, the great vessels of the mediastinum and the coronary arteries, including calcified atherosclerotic plaque in the left main and left circumflex coronary arteries. There are multiple borderline enlarged and mildly enlarged mediastinal lymph nodes, measuring up to 12 mm in the anterior mediastinum and in the right paratracheal nodal station. Fullness in the hilar regions could suggest additional lymphadenopathy, but is poorly evaluated on today's noncontrast CT examination. Esophagus is unremarkable in appearance. No axillary lymphadenopathy. Lungs/Pleura: There are patchy areas of dense  ground-glass attenuation scattered throughout the lungs bilaterally, rather confluent in the central aspects of the right lung, and more nodular and patchy in distribution in the left lung. In the areas of greatest involvement, particularly in the right upper lobe, there appears to be some associated interlobular septal thickening. Small right pleural effusion. Peripheral areas of subpleural reticulation with some areas of mild traction cylindrical bronchiectasis and peripheral bronchiolectasis in the lung bases, and a few areas that are concerning for developing honeycombing (best appreciated in the left lower lobe on image 35 of series 5). These fibrotic changes are not noted in the upper lungs. Upper Abdomen: Unremarkable. Musculoskeletal/Soft Tissues: There are no aggressive appearing lytic or blastic lesions noted in the visualized  portions of the skeleton. IMPRESSION: 1. There is a complex spectrum of findings on today's examination, which is favored to be related to at least 2 separate processes. Specifically, there is evidence on today's examination of interstitial lung disease, with findings concerning for early usual interstitial pneumonia (UIP) throughout the lung bases. These findings have slightly progressed compared to prior CT of the abdomen and pelvis from 09/14/2011. In addition, there is an impressive amount of ground-glass attenuation, some of which has associated interlobular septal thickening. Given the patient's history of hemoptysis, many these areas may simply represent regions of alveolar hemorrhage. Similar findings can be seen in the setting of pneumonia, particularly with viral or other atypical organisms. Alternatively, the possibility of an infiltrative neoplasm such as adenocarcinoma could be considered, however, the findings on today's examination are quite extensive, and it would be unusual for findings such as these to develop since prior chest x-ray 10/01/2011 if indeed this is a slow-growing neoplasm such as an adenocarcinoma. Continued imaging surveillance is recommended to ensure resolution of these findings with repeat standing PA and lateral chest radiographs in the next 2-3 weeks. 2. Regardless of what happens acutely, followup high-resolution chest CT is suggested in the next 6-12 months to assess for further temporal changes in the appearance of the lung parenchyma, and further evaluation for potential interstitial lung disease is recommended in the near future. 3. Atherosclerosis, including left main and left circumflex coronary artery disease. Please note that although the presence of coronary artery calcium documents the presence of coronary artery disease, the severity of this disease and any potential stenosis cannot be assessed on this non-gated CT examination. Assessment for potential risk factor  modification, dietary therapy or pharmacologic therapy may be warranted, if clinically indicated. Electronically Signed   By: Vinnie Langton M.D.   On: 03/19/2015 14:54        Scheduled Meds: . amLODipine  5 mg Oral Daily  . azithromycin  500 mg Intravenous Q24H  . cefTRIAXone (ROCEPHIN)  IV  1 g Intravenous Q24H  . enoxaparin (LOVENOX) injection  40 mg Subcutaneous Q24H  . escitalopram  5 mg Oral Daily  . feeding supplement (ENSURE ENLIVE)  237 mL Oral BID BM  . furosemide  40 mg Oral Daily  . guaiFENesin  600 mg Oral BID  . ipratropium-albuterol  3 mL Nebulization TID  . latanoprost  1 drop Both Eyes QHS  . Linaclotide  290 mcg Oral Daily  . losartan  50 mg Oral Daily  . meloxicam  15 mg Oral Daily  . metoprolol  50 mg Oral BID  . pantoprazole  40 mg Oral Daily  . potassium chloride SA  20 mEq Oral Daily  . pravastatin  40 mg Oral q1800   Continuous Infusions: .  sodium chloride 75 mL/hr at 03/19/15 1016    Principal Problem:   CAP (community acquired pneumonia) Active Problems:   Essential hypertension   Asthma   GERD   LOW BACK PAIN   Anemia    Time spent: 45 minutes.    Vernell Leep, MD, FACP, FHM. [facility] Pager [phone]  If 7PM-7AM, please contact night-coverage [email or web address] Password [email or web address] 03/19/2015, 6:27 PM    LOS: 1 day

## 2015-03-19 NOTE — Progress Notes (Signed)
Initial Nutrition Assessment  DOCUMENTATION CODES:   Obesity unspecified  INTERVENTION:  -Ensure Enlive po BID, each supplement provides 350 kcal and 20 grams of protein -RD to continue to monitor for needs  NUTRITION DIAGNOSIS:   Inadequate oral intake related to other (see comment), poor appetite (shortness of breath) as evidenced by per patient/family report.  GOAL:   Patient will meet greater than or equal to 90% of their needs  MONITOR:   PO intake, Labs, I & O's, Skin  REASON FOR ASSESSMENT:   Malnutrition Screening Tool    ASSESSMENT:   This is a pleasant lifelong nonsmoking female who 70 years old who is admitted to [facility] long hospital yesterday for cough, shortness of breath, and some chest pain. She tells me that in November she had what felt like a cold. Specifically, she had sinus symptoms, headache, body aches, and felt febrile and developed a cough. Her symptoms were associated with mucus production. She was seen by her primary care physician and was prescribed an antibiotic, she thinks Z-Pak but she's not sure. Her symptoms initially improved a little bit but she continued to have cough throughout the month of December and around Christmastime. Not long after Christmas she started having progressive shortness of breath, right-sided chest tightness, a fever at home, and cough with worsening mucus production. She also coughed up at least a tablespoon of blood a few days ago. This is occurred once or twice since then. She was admitted to the hospital yesterday with a diagnosis of community-acquired pneumonia after an infiltrate was seen in the right chest.  Spoke with pt, daughter at bedside.Pt admits to poor intake over past couple weeks along with chest tightness and what felt like a cold. Pt also admitted to weight loss from 230# to now 208#. Per chart, pt has lost 6# in 7 months. PT denies nausea/vomiting/diarrhea, has some constipation. No chewing or swallowing  problems. Pt states she mostly feels tired when she eats, or even getting up to walk to the bathroom.   Had pt try Ensure while in room, she liked, will continue to provide.  Labs and Medications reviewed.   Diet Order:  Diet Heart Room service appropriate?: Yes; Fluid consistency:: Thin  Skin:  Reviewed, no issues  Last BM:  12/31  Height:   Ht Readings from Last 1 Encounters:  03/18/15 5\' 5"  (1.651 m)    Weight:   Wt Readings from Last 1 Encounters:  03/18/15 208 lb (94.348 kg)    Ideal Body Weight:  56.81 kg  BMI:  Body mass index is 34.61 kg/(m^2).  Estimated Nutritional Needs:   Kcal:  1450-1750 calories  Protein:  95-105 grams  Fluid:  >/= 1.5L  EDUCATION NEEDS:   Education needs addressed  Miranda [name]. Miranda Meadowcroft, MS, RD LDN After Hours/Weekend Pager [phone]

## 2015-03-20 DIAGNOSIS — J9601 Acute respiratory failure with hypoxia: Secondary | ICD-10-CM | POA: Diagnosis present

## 2015-03-20 DIAGNOSIS — M545 Low back pain: Secondary | ICD-10-CM

## 2015-03-20 DIAGNOSIS — G8929 Other chronic pain: Secondary | ICD-10-CM

## 2015-03-20 LAB — CBC
HEMATOCRIT: 32.5 % — AB (ref 36.0–46.0)
HEMOGLOBIN: 10.1 g/dL — AB (ref 12.0–15.0)
MCH: 27.5 pg (ref 26.0–34.0)
MCHC: 31.1 g/dL (ref 30.0–36.0)
MCV: 88.6 fL (ref 78.0–100.0)
Platelets: 221 10*3/uL (ref 150–400)
RBC: 3.67 MIL/uL — ABNORMAL LOW (ref 3.87–5.11)
RDW: 14.6 % (ref 11.5–15.5)
WBC: 7.1 10*3/uL (ref 4.0–10.5)

## 2015-03-20 LAB — BASIC METABOLIC PANEL
ANION GAP: 7 (ref 5–15)
BUN: 13 mg/dL (ref 6–20)
CHLORIDE: 111 mmol/L (ref 101–111)
CO2: 28 mmol/L (ref 22–32)
Calcium: 9 mg/dL (ref 8.9–10.3)
Creatinine, Ser: 0.65 mg/dL (ref 0.44–1.00)
GFR calc Af Amer: >60 mL/min (ref >60)
GLUCOSE: 121 mg/dL — AB (ref 65–99)
POTASSIUM: 5 mmol/L (ref 3.5–5.1)
Sodium: 146 mmol/L — ABNORMAL HIGH (ref 135–145)

## 2015-03-20 LAB — EXPECTORATED SPUTUM ASSESSMENT W GRAM STAIN, RFLX TO RESP C

## 2015-03-20 LAB — EXPECTORATED SPUTUM ASSESSMENT W REFEX TO RESP CULTURE

## 2015-03-20 MED ORDER — GUAIFENESIN ER 600 MG PO TB12
1200.0000 mg | ORAL_TABLET | Freq: Two times a day (BID) | ORAL | Status: DC
  Administered 2015-03-20 – 2015-03-23 (×6): 1200 mg via ORAL
  Filled 2015-03-20 (×7): qty 2

## 2015-03-20 NOTE — Care Management Note (Signed)
Case Management Note  Patient Details  Name: Miranda [name] MRN: [id number] Date of Birth: April 02, 1945  Subjective/Objective: 70 y.o. F admitted 03/18/2015 for CAP with Persistent Cough and SOB. Lives in [place] with Spouse.                    Action/Plan: Anticipate discharge home . No further CM needs but will be available should additional discharge needs arise.   Expected Discharge Date:   (unknown)               Expected Discharge Plan:     In-House Referral:     Discharge planning Services  CM Consult  Post Acute Care Choice:    Choice offered to:     DME Arranged:    DME Agency:     HH Arranged:    HH Agency:     Status of Service:  In process, will continue to follow  Medicare Important Message Given:    Date Medicare IM Given:    Medicare IM give by:    Date Additional Medicare IM Given:    Additional Medicare Important Message give by:     If discussed at [place] of Stay Meetings, dates discussed:    Additional Comments:  Delrae Sawyers, RN 03/20/2015, 4:12 PM

## 2015-03-20 NOTE — Progress Notes (Signed)
[facility] PROGRESS NOTE  Miranda [name] [id number] DOB: 27-Aug-1945 DOA: 03/18/2015 PCP: Nyoka Cowden, MD  Brief interval history 70 year old with history of HTN, HLD, asthma, anxiety, lives with daughter, has never smoked but exposed to heavy passive smoking from daughter, treated for acute bronchitis with antibiotics in November, transiently improved and has been progressively worsening with productive cough, dyspnea, intermittent fevers, blood-tinged sputum. Admitted for evaluation and management of possible community-acquired pneumonia. CCM consulted given significantly abnormal chest x-ray.    Assessment/Plan: #1 CAP/probable interstitial lung disease Patient being treated for chlamydia acquired pneumonia per chest x-ray. CT chest shows unusual interstitial pneumonia and possible alveolar hemorrhage. Continue IV Rocephin and IV azithromycin. Patient will likely need repeat CT chest in about 6-8 weeks. Increase Mucinex to 1200 mg twice a day. Continue when necessary Tessalon Perles. Continue bronchodilators. Benazepril and dulera have been discontinued per pulmonary medicine. Pulmonary medicine following and appreciate input and recommendations.  #2 hypertension ACE inhibitor have been discontinued secondary to cough. Continue ARB, metoprolol, Norvasc.  #3 asthma Stable.  #4 anemia Follow H&H.  #5 hyperlipidemia Stable. Continue statin.  #6 anxiety Continue Lexapro and Xanax.  #7 chronic back pain Continue current pain regimen.  #8 gastroesophageal reflux disease PPI.  #9 prophylaxis PPI for GI prophylaxis. SCDs for DVT prophylaxis.  Code Status: Full Family Communication: Updated patient. No family at bedside. Disposition Plan: Home when medically stable.   Consultants:  PCCM: Dr [name] 03/19/2015  Procedures:  CT chest 03/19/2015  Chest x-ray 03/18/2015    Antibiotics:  IV Rocephin 03/18/2015    IV azithromycin  03/18/2015  HPI/Subjective: Patient complaining of shortness of breath on minimal exertion. Patient still with cough.  Objective: Filed Vitals:   03/20/15 0538 03/20/15 1201  BP: 155/81   Pulse: 62   Temp: 97.7 F (36.5 C)   Resp: 20 20    Intake/Output Summary (Last 24 hours) at 03/20/15 1237 Last data filed at 03/20/15 1033  Gross per 24 hour  Intake    240 ml  Output    304 ml  Net    -64 ml   Filed Weights   03/18/15 2000  Weight: 94.348 kg (208 lb)    Exam:   General:  NAD  Cardiovascular: RRR  Respiratory: Expiratory wheezing.  Abdomen: Soft, nontender, nondistended, positive bowel sounds.  Musculoskeletal: No clubbing cyanosis or edema.  Data Reviewed: Basic Metabolic Panel:  Recent Labs Lab 03/18/15 1622 03/19/15 0520 03/20/15 0530  NA 141 141 146*  K 4.0 4.0 5.0  CL 105 110 111  CO2 26 26 28   GLUCOSE 104* 102* 121*  BUN 10 11 13   CREATININE 0.67 0.65 0.65  CALCIUM 9.2 8.7* 9.0   Liver Function Tests:  Recent Labs Lab 03/19/15 0520  AST 18  ALT 20  ALKPHOS 39  BILITOT 0.9  PROT 6.8  ALBUMIN 3.1*   No results for input(s): LIPASE, AMYLASE in the last 168 hours. No results for input(s): AMMONIA in the last 168 hours. CBC:  Recent Labs Lab 03/18/15 1622 03/19/15 0520 03/20/15 0530  WBC 11.0* 6.5 7.1  HGB 11.5* 10.0* 10.1*  HCT 36.0 31.7* 32.5*  MCV 86.5 87.1 88.6  PLT 223 200 221   Cardiac Enzymes:  Recent Labs Lab 03/18/15 2200  TROPONINI <0.03   BNP (last 3 results) No results for input(s): BNP in the last 8760 hours.  ProBNP (last 3 results) No results for input(s): PROBNP in the last 8760 hours.  CBG: No results for input(s):  GLUCAP in the last 168 hours.  Recent Results (from the past 240 hour(s))  Culture, blood (Routine X 2) w Reflex to ID Panel     Status: None (Preliminary result)   Collection Time: 03/18/15  6:01 PM  Result Value Ref Range Status   Specimen Description LEFT ANTECUBITAL  Final    Special Requests BOTTLES DRAWN AEROBIC AND ANAEROBIC 5CC  Final   Culture   Final    NO GROWTH < 24 HOURS Performed at [facility]    Report Status PENDING  Incomplete  Culture, blood (Routine X 2) w Reflex to ID Panel     Status: None (Preliminary result)   Collection Time: 03/18/15  6:20 PM  Result Value Ref Range Status   Specimen Description BLOOD RIGHT HAND  Final   Special Requests IN PEDIATRIC BOTTLE 3CC  Final   Culture   Final    NO GROWTH < 24 HOURS Performed at [facility]    Report Status PENDING  Incomplete  Culture, sputum-assessment     Status: None   Collection Time: 03/19/15  6:19 AM  Result Value Ref Range Status   Specimen Description SPUTUM  Final   Special Requests Normal  Final   Sputum evaluation   Final    MICROSCOPIC FINDINGS SUGGEST THAT THIS SPECIMEN IS NOT REPRESENTATIVE OF LOWER RESPIRATORY SECRETIONS. PLEASE RECOLLECT. NOTIFIED RN AT [phone] ON 1.3.17 BY SHUEA    Report Status 03/19/2015 FINAL  Final  Culture, expectorated sputum-assessment     Status: None   Collection Time: 03/20/15  7:39 AM  Result Value Ref Range Status   Specimen Description SPUTUM  Final   Special Requests NONE  Final   Sputum evaluation   Final    MICROSCOPIC FINDINGS SUGGEST THAT THIS SPECIMEN IS NOT REPRESENTATIVE OF LOWER RESPIRATORY SECRETIONS. PLEASE RECOLLECT. NOTIFIED NAKIA RN AT 0830 ON 1.4.17 BY SHUEA    Report Status 03/20/2015 FINAL  Final     Studies: Dg Chest 2 View  03/18/2015  CLINICAL DATA:  Shortness of breath. Mid to LEFT chest pain for 1 week. Coughing up blood for 4 days. EXAM: CHEST  2 VIEW COMPARISON:  70 13. FINDINGS: Normal cardiac size. Difficult to exclude hilar adenopathy, possibly greater on the RIGHT. BILATERAL perihilar opacities are seen worst in the RIGHT upper lobe, which in the appropriate clinical setting could represent pneumonia, hemorrhage, or tumor. Trace blunting both CP angles. No osseous findings. IMPRESSION: BILATERAL  pulmonary opacities, worst in the RIGHT upper lobe. See discussion above. CT chest with contrast may be helpful in further evaluation of extent and pathology. Electronically Signed   By: Staci Righter M.D.   On: 03/18/2015 16:49   Ct Chest Wo Contrast  03/19/2015  CLINICAL DATA:  70 year old female with history of community acquired pneumonia. Intermittent hemoptysis for 1 week. Shortness of breath. Nonsmoker. EXAM: CT CHEST WITHOUT CONTRAST TECHNIQUE: Multidetector CT imaging of the chest was performed following the standard protocol without IV contrast. COMPARISON:  No prior chest CT.  Chest x-ray 03/18/2015. FINDINGS: Mediastinum/Lymph Nodes: Heart size is mildly enlarged. There is no significant pericardial fluid, thickening or pericardial calcification. There is atherosclerosis of the thoracic aorta, the great vessels of the mediastinum and the coronary arteries, including calcified atherosclerotic plaque in the left main and left circumflex coronary arteries. There are multiple borderline enlarged and mildly enlarged mediastinal lymph nodes, measuring up to 12 mm in the anterior mediastinum and in the right paratracheal nodal station. Fullness in the  hilar regions could suggest additional lymphadenopathy, but is poorly evaluated on today's noncontrast CT examination. Esophagus is unremarkable in appearance. No axillary lymphadenopathy. Lungs/Pleura: There are patchy areas of dense ground-glass attenuation scattered throughout the lungs bilaterally, rather confluent in the central aspects of the right lung, and more nodular and patchy in distribution in the left lung. In the areas of greatest involvement, particularly in the right upper lobe, there appears to be some associated interlobular septal thickening. Small right pleural effusion. Peripheral areas of subpleural reticulation with some areas of mild traction cylindrical bronchiectasis and peripheral bronchiolectasis in the lung bases, and a few areas  that are concerning for developing honeycombing (best appreciated in the left lower lobe on image 35 of series 5). These fibrotic changes are not noted in the upper lungs. Upper Abdomen: Unremarkable. Musculoskeletal/Soft Tissues: There are no aggressive appearing lytic or blastic lesions noted in the visualized portions of the skeleton. IMPRESSION: 1. There is a complex spectrum of findings on today's examination, which is favored to be related to at least 2 separate processes. Specifically, there is evidence on today's examination of interstitial lung disease, with findings concerning for early usual interstitial pneumonia (UIP) throughout the lung bases. These findings have slightly progressed compared to prior CT of the abdomen and pelvis from 09/14/2011. In addition, there is an impressive amount of ground-glass attenuation, some of which has associated interlobular septal thickening. Given the patient's history of hemoptysis, many these areas may simply represent regions of alveolar hemorrhage. Similar findings can be seen in the setting of pneumonia, particularly with viral or other atypical organisms. Alternatively, the possibility of an infiltrative neoplasm such as adenocarcinoma could be considered, however, the findings on today's examination are quite extensive, and it would be unusual for findings such as these to develop since prior chest x-ray 10/01/2011 if indeed this is a slow-growing neoplasm such as an adenocarcinoma. Continued imaging surveillance is recommended to ensure resolution of these findings with repeat standing PA and lateral chest radiographs in the next 2-3 weeks. 2. Regardless of what happens acutely, followup high-resolution chest CT is suggested in the next 6-12 months to assess for further temporal changes in the appearance of the lung parenchyma, and further evaluation for potential interstitial lung disease is recommended in the near future. 3. Atherosclerosis, including left  main and left circumflex coronary artery disease. Please note that although the presence of coronary artery calcium documents the presence of coronary artery disease, the severity of this disease and any potential stenosis cannot be assessed on this non-gated CT examination. Assessment for potential risk factor modification, dietary therapy or pharmacologic therapy may be warranted, if clinically indicated. Electronically Signed   By: Vinnie Langton M.D.   On: 03/19/2015 14:54    Scheduled Meds: . amLODipine  5 mg Oral Daily  . azithromycin  500 mg Intravenous Q24H  . cefTRIAXone (ROCEPHIN)  IV  1 g Intravenous Q24H  . escitalopram  5 mg Oral Daily  . feeding supplement (ENSURE ENLIVE)  237 mL Oral BID BM  . furosemide  40 mg Oral Daily  . guaiFENesin  600 mg Oral BID  . ipratropium-albuterol  3 mL Nebulization TID  . latanoprost  1 drop Both Eyes QHS  . Linaclotide  290 mcg Oral Daily  . losartan  50 mg Oral Daily  . meloxicam  15 mg Oral Daily  . metoprolol  50 mg Oral BID  . pantoprazole  40 mg Oral Daily  . potassium chloride SA  20  mEq Oral Daily  . pravastatin  40 mg Oral q1800   Continuous Infusions:   Principal Problem:   Acute respiratory failure with hypoxia (HCC) Active Problems:   CAP (community acquired pneumonia)   Essential hypertension   Asthma   GERD   LOW BACK PAIN   Anemia    Time spent: 40 mins    [facility] MD [facility] Pager [phone]. If 7PM-7AM, please contact night-coverage at [email or web address], password [facility] 03/20/2015, 12:37 PM  LOS: 2 days

## 2015-03-20 NOTE — Progress Notes (Signed)
PULMONARY / CRITICAL CARE MEDICINE   Name: Miranda [name] MRN: [id number] DOB: 08-22-1945    ADMISSION DATE:  03/18/2015 CONSULTATION DATE:  03/19/2015  REFERRING MD:  Dr. Algis Liming with [facility]  CHIEF COMPLAINT:  Shortness of breath, cough  HISTORY OF PRESENT ILLNESS:   This is a pleasant lifelong nonsmoking female who 70 years old who is admitted to [facility] hospital  1/2  for cough, shortness of breath, and some chest pain. She was admitted w/ working  diagnosis of community-acquired pneumonia after an infiltrate was seen in the right chest. PCCM was asked to eval SUBJECTIVE:  Feels a little better but still very SOB w/ activity, and has pain w/ cough.   VITAL SIGNS: BP 155/81 mmHg  Pulse 62  Temp(Src) 97.7 F (36.5 C) (Oral)  Resp 20  Ht 5\' 5"  (1.651 m)  Wt 94.348 kg (208 lb)  BMI 34.61 kg/m2  SpO2 100% 2.5 liters  HEMODYNAMICS:    VENTILATOR SETTINGS:    INTAKE / OUTPUT: I/O last 3 completed shifts: In: -  Out: 4 [Urine:2; Stool:2]  PHYSICAL EXAMINATION: General:  Well appearing, in bed Neuro:  Awake alert, oriented 4, moves all 4 extremities well HEENT:  Normocephalic atraumatic, oropharynx clear, extraocular movements intact Cardiovascular:  Regular rate and rhythm, no clear murmurs gallops or rubs on my exam, cannot assess JVD Lungs:  Inspiratory crackles, egophony, and mild wheezing on the right side, + cough w/ deep breath  Abdomen:  Bowel sounds positive, nontender nondistended Musculoskeletal:  No bony deformities, normal muscle bulk and tone Skin:  No rash or skin breakdown  LABS:  BMET  Recent Labs Lab 03/18/15 1622 03/19/15 0520 03/20/15 0530  NA 141 141 146*  K 4.0 4.0 5.0  CL 105 110 111  CO2 26 26 28   BUN 10 11 13   CREATININE 0.67 0.65 0.65  GLUCOSE 104* 102* 121*    Electrolytes  Recent Labs Lab 03/18/15 1622 03/19/15 0520 03/20/15 0530  CALCIUM 9.2 8.7* 9.0    CBC  Recent Labs Lab 03/18/15 1622  03/19/15 0520 03/20/15 0530  WBC 11.0* 6.5 7.1  HGB 11.5* 10.0* 10.1*  HCT 36.0 31.7* 32.5*  PLT 223 200 221    Coag's No results for input(s): APTT, INR in the last 168 hours.  Sepsis Markers  Recent Labs Lab 03/18/15 1820  LATICACIDVEN 1.9    ABG No results for input(s): PHART, PCO2ART, PO2ART in the last 168 hours.  Liver Enzymes  Recent Labs Lab 03/19/15 0520  AST 18  ALT 20  ALKPHOS 39  BILITOT 0.9  ALBUMIN 3.1*    Cardiac Enzymes  Recent Labs Lab 03/18/15 2200  TROPONINI <0.03    Glucose No results for input(s): GLUCAP in the last 168 hours.  Imaging No results found.   STUDIES:  03/19/2015 CT chest: Specifically, there is evidence on today's examination of interstitial lung disease, with findings concerning for early usual interstitial pneumonia (UIP) throughout the lung bases. These findings have slightly progressed compared to prior CT of the abdomen and pelvis from 09/14/2011. In addition, there is an impressive amount of ground-glass attenuation, some of which has associated interlobular septal thickening. Given the patient's history of hemoptysis, many these areas may simply represent regions of alveolar hemorrhage. Similar findings can be seen in the setting of pneumonia, particularly with viral or other atypical organisms. Alternatively, the possibility of an infiltrative neoplasm such as adenocarcinoma could be considered  CULTURES: 03/18/2015 blood culture 03/19/2015 sputum culture  ANTIBIOTICS: 03/18/2015 ceftriaxone>> 03/18/2015 azithromycin>>>  SIGNIFICANT EVENTS: January 2 admitted  LINES/TUBES: None  DISCUSSION:  This is a 70 year old female admitted w/ dx of community-acquired pneumonia after several weeks of a cough in the setting of an initial viral illness. She had an abrupt worsening over the last several days with chest tightness, pain, fever, mucopurulent sputum production. She has a right-sided infiltrate on physical  exam. Now, this entire picture is consistent with community-acquired pneumonia. However, CT findings also raise concern about underlying ILD. She reports she does have a h/o RA  ASSESSMENT / PLAN:  PULMONARY A: Community-acquired pneumonia Persistent cough Possible underlying ILD  P:   Continue Tessalon as needed for cough Continue ceftriaxone and azithromycin cont losartan, instead of ace i  Send ANA, RF, further serology per Dr [name].  Will need repeat CT imaging 6-8 weeks    CARDIOVASCULAR A:  Hypertension P:  Stop benazepril due to cough Start losartan, repeat basic metabolic panel tomorrow  Rest per [facility]    03/20/2015, 1:35 PM

## 2015-03-21 ENCOUNTER — Telehealth: Payer: Self-pay | Admitting: Adult Health

## 2015-03-21 DIAGNOSIS — D649 Anemia, unspecified: Secondary | ICD-10-CM

## 2015-03-21 LAB — CBC
HCT: 31.3 % — ABNORMAL LOW (ref 36.0–46.0)
Hemoglobin: 9.7 g/dL — ABNORMAL LOW (ref 12.0–15.0)
MCH: 26.9 pg (ref 26.0–34.0)
MCHC: 31 g/dL (ref 30.0–36.0)
MCV: 86.7 fL (ref 78.0–100.0)
Platelets: 208 10*3/uL (ref 150–400)
RBC: 3.61 MIL/uL — ABNORMAL LOW (ref 3.87–5.11)
RDW: 14.4 % (ref 11.5–15.5)
WBC: 6.5 10*3/uL (ref 4.0–10.5)

## 2015-03-21 LAB — BASIC METABOLIC PANEL
Anion gap: 8 (ref 5–15)
BUN: 12 mg/dL (ref 6–20)
CALCIUM: 8.6 mg/dL — AB (ref 8.9–10.3)
CO2: 28 mmol/L (ref 22–32)
CREATININE: 0.64 mg/dL (ref 0.44–1.00)
Chloride: 108 mmol/L (ref 101–111)
GFR calc non Af Amer: >60 mL/min (ref >60)
Glucose, Bld: 113 mg/dL — ABNORMAL HIGH (ref 65–99)
Potassium: 4 mmol/L (ref 3.5–5.1)
SODIUM: 144 mmol/L (ref 135–145)

## 2015-03-21 MED ORDER — CEFUROXIME AXETIL 500 MG PO TABS
500.0000 mg | ORAL_TABLET | Freq: Two times a day (BID) | ORAL | Status: DC
  Administered 2015-03-22 – 2015-03-23 (×3): 500 mg via ORAL
  Filled 2015-03-21 (×5): qty 1

## 2015-03-21 MED ORDER — AZITHROMYCIN 500 MG PO TABS
500.0000 mg | ORAL_TABLET | Freq: Every day | ORAL | Status: DC
  Administered 2015-03-22 – 2015-03-23 (×2): 500 mg via ORAL
  Filled 2015-03-21 (×2): qty 1

## 2015-03-21 NOTE — Care Management Important Message (Signed)
Important Message  Patient Details  Name: KARYLE ANGLEN MRN: [id number] Date of Birth: February 27, 1946   Medicare Important Message Given:  Yes    Camillo Flaming 03/21/2015, 1:56 PMImportant Message  Patient Details  Name: MALAIKA MELLEM MRN: [id number] Date of Birth: Aug 01, 1945   Medicare Important Message Given:  Yes    Camillo Flaming 03/21/2015, 1:56 PM

## 2015-03-21 NOTE — Telephone Encounter (Signed)
This has been added to appt notes on pt scheduled appt.

## 2015-03-21 NOTE — Progress Notes (Addendum)
[facility] PROGRESS NOTE  Miranda [name] [id number] DOB: [birth year] DOA: 03/18/2015 PCP: Nyoka Cowden, MD  Brief interval history 70 year old with history of HTN, HLD, asthma, anxiety, lives with daughter, has never smoked but exposed to heavy passive smoking from daughter, treated for acute bronchitis with antibiotics in November, transiently improved and has been progressively worsening with productive cough, dyspnea, intermittent fevers, blood-tinged sputum. Admitted for evaluation and management of possible community-acquired pneumonia. CCM consulted given significantly abnormal chest x-ray.    Assessment/Plan: #1 CAP/probable interstitial lung disease Patient being treated for community acquired pneumonia per chest x-ray. CT chest shows unusual interstitial pneumonia and possible alveolar hemorrhage. ANA, RF have been ordered and currently pending. Clinical improvement. Patient ambulating with sats in the mid 90s and clinical exam improved. Continue IV Rocephin and IV azithromycin. Transition to oral antibiotics tomorrow. Patient will likely need repeat CT chest in about 6-8 weeks. Continue Mucinex to 1200 mg twice a day. Continue when necessary Tessalon Perles. Continue bronchodilators. Benazepril and dulera have been discontinued per pulmonary medicine. Repeat chest x-ray has been ordered per pulmonary. Pulmonary medicine following and appreciate input and recommendations. Patient to follow-up with pulmonary medicine on 04/05/2015 at 2:45 PM for chest x-ray and then at 3:15 for follow-up with Rexene Edison, NP.  #2 hypertension ACE inhibitor have been discontinued secondary to cough. Continue ARB, metoprolol, Norvasc.  #3 asthma Stable.  #4 anemia Follow H&H.  #5 hyperlipidemia Stable. Continue statin.  #6 anxiety Continue Lexapro and Xanax.  #7 chronic back pain Continue current pain regimen.  #8 gastroesophageal reflux disease PPI.  #9  prophylaxis PPI for GI prophylaxis. SCDs for DVT prophylaxis.  Code Status: Full Family Communication: Updated patient. No family at bedside. Disposition Plan: Home when medically stable, hopefully 1-2 days.   Consultants:  PCCM: Dr [name] 03/19/2015  Procedures:  CT chest 03/19/2015  Chest x-ray 03/18/2015    Antibiotics:  IV Rocephin 03/18/2015 >>>>>> 03/21/2015   IV azithromycin 03/18/2015>>>>>> 03/21/2015   oral ceftin and oral azithromycin 03/22/2015  HPI/Subjective: Patient states she's feeling better. Patient states shortness of breath has improved. Patient ambulating with sats in the mid 90s.  Objective: Filed Vitals:   03/21/15 0452 03/21/15 1355  BP: 156/81 148/61  Pulse: 57 58  Temp: 98.2 F (36.8 C) 98.2 F (36.8 C)  Resp: 18 16    Intake/Output Summary (Last 24 hours) at 03/21/15 1731 Last data filed at 03/21/15 0843  Gross per 24 hour  Intake    240 ml  Output      0 ml  Net    240 ml   Filed Weights   03/18/15 2000  Weight: 94.348 kg (208 lb)    Exam:   General:  NAD  Cardiovascular: RRR  Respiratory: Minimal Expiratory wheezing.  Abdomen: Soft, nontender, nondistended, positive bowel sounds.  Musculoskeletal: No clubbing cyanosis or edema.  Data Reviewed: Basic Metabolic Panel:  Recent Labs Lab 03/18/15 1622 03/19/15 0520 03/20/15 0530 03/21/15 0539  NA 141 141 146* 144  K 4.0 4.0 5.0 4.0  CL 105 110 111 108  CO2 26 26 28 28   GLUCOSE 104* 102* 121* 113*  BUN 10 11 13 12   CREATININE 0.67 0.65 0.65 0.64  CALCIUM 9.2 8.7* 9.0 8.6*   Liver Function Tests:  Recent Labs Lab 03/19/15 0520  AST 18  ALT 20  ALKPHOS 39  BILITOT 0.9  PROT 6.8  ALBUMIN 3.1*   No results for input(s): LIPASE, AMYLASE in the last 168 hours. No  results for input(s): AMMONIA in the last 168 hours. CBC:  Recent Labs Lab 03/18/15 1622 03/19/15 0520 03/20/15 0530 03/21/15 0539  WBC 11.0* 6.5 7.1 6.5  HGB 11.5* 10.0* 10.1* 9.7*   HCT 36.0 31.7* 32.5* 31.3*  MCV 86.5 87.1 88.6 86.7  PLT 223 200 221 208   Cardiac Enzymes:  Recent Labs Lab 03/18/15 2200  TROPONINI <0.03   BNP (last 3 results) No results for input(s): BNP in the last 8760 hours.  ProBNP (last 3 results) No results for input(s): PROBNP in the last 8760 hours.  CBG: No results for input(s): GLUCAP in the last 168 hours.  Recent Results (from the past 240 hour(s))  Culture, blood (Routine X 2) w Reflex to ID Panel     Status: None (Preliminary result)   Collection Time: 03/18/15  6:01 PM  Result Value Ref Range Status   Specimen Description LEFT ANTECUBITAL  Final   Special Requests BOTTLES DRAWN AEROBIC AND ANAEROBIC 5CC  Final   Culture   Final    NO GROWTH 3 DAYS Performed at [facility]    Report Status PENDING  Incomplete  Culture, blood (Routine X 2) w Reflex to ID Panel     Status: None (Preliminary result)   Collection Time: 03/18/15  6:20 PM  Result Value Ref Range Status   Specimen Description BLOOD RIGHT HAND  Final   Special Requests IN PEDIATRIC BOTTLE 3CC  Final   Culture   Final    NO GROWTH 3 DAYS Performed at [facility]    Report Status PENDING  Incomplete  Culture, sputum-assessment     Status: None   Collection Time: 03/19/15  6:19 AM  Result Value Ref Range Status   Specimen Description SPUTUM  Final   Special Requests Normal  Final   Sputum evaluation   Final    MICROSCOPIC FINDINGS SUGGEST THAT THIS SPECIMEN IS NOT REPRESENTATIVE OF LOWER RESPIRATORY SECRETIONS. PLEASE RECOLLECT. NOTIFIED RN AT [phone] ON 1.3.17 BY SHUEA    Report Status 03/19/2015 FINAL  Final  Culture, expectorated sputum-assessment     Status: None   Collection Time: 03/20/15  7:39 AM  Result Value Ref Range Status   Specimen Description SPUTUM  Final   Special Requests NONE  Final   Sputum evaluation   Final    MICROSCOPIC FINDINGS SUGGEST THAT THIS SPECIMEN IS NOT REPRESENTATIVE OF LOWER RESPIRATORY SECRETIONS.  PLEASE RECOLLECT. NOTIFIED NAKIA RN AT 0830 ON 1.4.17 BY SHUEA    Report Status 03/20/2015 FINAL  Final     Studies: No results found.  Scheduled Meds: . amLODipine  5 mg Oral Daily  . azithromycin  500 mg Intravenous Q24H  . cefTRIAXone (ROCEPHIN)  IV  1 g Intravenous Q24H  . escitalopram  5 mg Oral Daily  . feeding supplement (ENSURE ENLIVE)  237 mL Oral BID BM  . furosemide  40 mg Oral Daily  . guaiFENesin  1,200 mg Oral BID  . ipratropium-albuterol  3 mL Nebulization TID  . latanoprost  1 drop Both Eyes QHS  . Linaclotide  290 mcg Oral Daily  . losartan  50 mg Oral Daily  . meloxicam  15 mg Oral Daily  . metoprolol  50 mg Oral BID  . pantoprazole  40 mg Oral Daily  . potassium chloride SA  20 mEq Oral Daily  . pravastatin  40 mg Oral q1800   Continuous Infusions:   Principal Problem:   Acute respiratory failure with hypoxia (HCC) Active  Problems:   CAP (community acquired pneumonia)   Essential hypertension   Asthma   GERD   LOW BACK PAIN   Anemia    Time spent: 40 mins    [facility] MD [facility] Pager [phone]. If 7PM-7AM, please contact night-coverage at [email or web address], password [email or web address] 03/21/2015, 5:31 PM  LOS: 3 days

## 2015-03-21 NOTE — Evaluation (Signed)
Physical Therapy Evaluation Patient Details Name: Miranda [name] MRN: [id number] DOB: March 25, 1945 Today's Date: 03/21/2015   History of Present Illness  Miranda [name] is a 70 year old female with a past medical history significant for HTN, HLD, DDD, asthma, anxiety; who presents with complaints of shortness of breath and cough  Clinical Impression  Pt admitted with above diagnosis. Pt currently with functional limitations due to the deficits listed below (see PT Problem List).  Pt will benefit from skilled PT to increase their independence and safety with mobility to allow discharge to the venue listed below.    Pt with incr WOB with amb (3/4DOE); O2 sats 95-96% on RA during and after amb; HR 65-90     Follow Up Recommendations Home health PT;No PT follow up (depending on progress)    Equipment Recommendations  Other (comment) (pt wants a rollator (4wheeled walker) which would likely be appropriate in this case)    Recommendations for Other Services       Precautions / Restrictions Precautions Precautions: Fall Precaution Comments: monitor sats      Mobility  Bed Mobility Overal bed mobility: Needs Assistance Bed Mobility: Supine to Sit     Supine to sit: Supervision     General bed mobility comments: incr time, effortful  Transfers Overall transfer level: Needs assistance Equipment used: None Transfers: Sit to/from [facility] Sit to Stand: Min assist Stand pivot transfers: Min assist;Min guard       General transfer comment: cues for safety and hand placement, requires incr time to complete task  Ambulation/Gait Ambulation/Gait assistance: Min assist Ambulation Distance (Feet): 38 Feet Assistive device: None Gait Pattern/deviations: Step-through pattern;Decreased stride length;Trunk flexed;Narrow base of support;Shuffle   Gait velocity interpretation: Below normal speed for age/gender General Gait Details: pt would not agree to use RW although  very heavy reliance on therapist and IV pole; incr time and cues for rest and energy conservation ( pt wanted to keep going despite 2/4 DOE and chest discomfort), PT had to limit pt gait distance  Stairs            Wheelchair Mobility    Modified Rankin (Stroke Patients Only)       Balance Overall balance assessment: Needs assistance           Standing balance-Leahy Scale: Fair                               Pertinent Vitals/Pain Pain Assessment: No/denies pain    Home Living Family/patient expects to be discharged to:: Private residence Living Arrangements: Other relatives;Spouse/significant other;Alone (plans to stay with sister) Available Help at Discharge: Available 24 hours/day;Family (sister) Type of Home: Apartment Home Access: Elevator     Home Layout: One level Home Equipment: Cane - single point Additional Comments: pt reports she did not use cane very often    Prior Function Level of Independence: Independent               Hand Dominance        Extremity/Trunk Assessment   Upper Extremity Assessment: Defer to OT evaluation           Lower Extremity Assessment: Generalized weakness         Communication   Communication: No difficulties  Cognition Arousal/Alertness: Awake/alert Behavior During Therapy: WFL for tasks assessed/performed Overall Cognitive Status: Within Functional Limits for tasks assessed  General Comments      Exercises        Assessment/Plan    PT Assessment Patient needs continued PT services  PT Diagnosis Difficulty walking   PT Problem List Decreased strength;Decreased range of motion;Decreased activity tolerance;Decreased mobility;Decreased balance;Cardiopulmonary status limiting activity  PT Treatment Interventions DME instruction;Gait training;Functional mobility training;Therapeutic activities;Patient/family education;Therapeutic exercise   PT Goals  (Current goals can be found in the Care Plan section) Acute Rehab PT Goals Patient Stated Goal: get better PT Goal Formulation: With patient Time For Goal Achievement: 03/28/15 Potential to Achieve Goals: Good    Frequency Min 3X/week   Barriers to discharge        Co-evaluation               End of Session   Activity Tolerance: Patient limited by fatigue Patient left: in chair;with call bell/phone within reach;with chair alarm set Nurse Communication: Mobility status         Time: [id number] PT Time Calculation (min) (ACUTE ONLY): 33 min   Charges:   PT Evaluation $PT Eval Low Complexity: 1 Procedure PT Treatments $Gait Training: 8-22 mins   PT G Codes:        Miranda [name] March 26, 2015, 11:16 AM

## 2015-03-21 NOTE — Progress Notes (Signed)
PULMONARY / CRITICAL CARE MEDICINE   Name: Miranda [name] MRN: [id number] DOB: [birth year]    ADMISSION DATE:  03/18/2015 CONSULTATION DATE:  03/19/2015  REFERRING MD:  Dr. Algis Liming with [facility]  CHIEF COMPLAINT:  Shortness of breath, cough  HISTORY OF PRESENT ILLNESS:   This is a pleasant lifelong nonsmoking female who 70 years old who is admitted to [facility] long hospital  1/2  for cough, shortness of breath, and some chest pain. She was admitted w/ working  diagnosis of community-acquired pneumonia after an infiltrate was seen in the right chest. PCCM was asked to eval SUBJECTIVE:  Feels a little better but still very SOB w/ activity, and has pain w/ cough-->has improved a little    VITAL SIGNS: BP 156/81 mmHg  Pulse 57  Temp(Src) 98.2 F (36.8 C) (Oral)  Resp 18  Ht 5\' 5"  (1.651 m)  Wt 94.348 kg (208 lb)  BMI 34.61 kg/m2  SpO2 98% Room air      INTAKE / OUTPUT: I/O last 3 completed shifts: In: 240 [P.O.:240] Out: 302 [Urine:301; Stool:1]  PHYSICAL EXAMINATION: General:  Well appearing, in chair Neuro:  Awake alert, oriented 4, moves all 4 extremities well HEENT:  Normocephalic atraumatic, oropharynx clear, extraocular movements intact Cardiovascular:  Regular rate and rhythm, no clear murmurs gallops or rubs on my exam, cannot assess JVD Lungs:  Inspiratory crackles, egophony, and mild wheezing on the right side, + cough w/ deep breath--.perhaps a little better  Abdomen:  Bowel sounds positive, nontender nondistended Musculoskeletal:  No bony deformities, normal muscle bulk and tone Skin:  No rash or skin breakdown  LABS:  BMET  Recent Labs Lab 03/19/15 0520 03/20/15 0530 03/21/15 0539  NA 141 146* 144  K 4.0 5.0 4.0  CL 110 111 108  CO2 26 28 28   BUN 11 13 12   CREATININE 0.65 0.65 0.64  GLUCOSE 102* 121* 113*    CBC  Recent Labs Lab 03/19/15 0520 03/20/15 0530 03/21/15 0539  WBC 6.5 7.1 6.5  HGB 10.0* 10.1* 9.7*  HCT 31.7*  32.5* 31.3*  PLT 200 221 208    Coag's No results for input(s): APTT, INR in the last 168 hours.  Sepsis Markers  Recent Labs Lab 03/18/15 1820  LATICACIDVEN 1.9    ABG No results for input(s): PHART, PCO2ART, PO2ART in the last 168 hours.  Liver Enzymes  Recent Labs Lab 03/19/15 0520  AST 18  ALT 20  ALKPHOS 39  BILITOT 0.9  ALBUMIN 3.1*    Cardiac Enzymes  Recent Labs Lab 03/18/15 2200  TROPONINI <0.03    Glucose No results for input(s): GLUCAP in the last 168 hours.  Imaging No results found.   STUDIES:  03/19/2015 CT chest: Specifically, there is evidence on today's examination of interstitial lung disease, with findings concerning for early usual interstitial pneumonia (UIP) throughout the lung bases. These findings have slightly progressed compared to prior CT of the abdomen and pelvis from 09/14/2011. In addition, there is an impressive amount of ground-glass attenuation, some of which has associated interlobular septal thickening. Given the patient's history of hemoptysis, many these areas may simply represent regions of alveolar hemorrhage. Similar findings can be seen in the setting of pneumonia, particularly with viral or other atypical organisms. Alternatively, the possibility of an infiltrative neoplasm such as adenocarcinoma could be considered  CULTURES: 03/18/2015 blood culture 03/19/2015 sputum culture  ANTIBIOTICS: 03/18/2015 ceftriaxone>> 03/18/2015 azithromycin>>>  SIGNIFICANT EVENTS: January 2 admitted  LINES/TUBES: None  DISCUSSION:  This is a 70 year old female admitted w/ dx of community-acquired pneumonia after several weeks of a cough in the setting of an initial viral illness. She had an abrupt worsening over the last several days with chest tightness, pain, fever, mucopurulent sputum production. She has a right-sided infiltrate on physical exam. Now, this entire picture is consistent with community-acquired pneumonia.  However, CT findings also raise concern about underlying ILD. She reports she does have a h/o RA. We will see her in ~ 3 weeks and repeat CXR.   ASSESSMENT / PLAN:   Community-acquired pneumonia Persistent cough Possible underlying ILD  P:   Continue Tessalon as needed for cough Continue ceftriaxone and azithromycin-->transition to oral abx  cont losartan, instead of ace i  f/u ANA, RF, further serology per Dr [name].  We will see on Frid Jan 20th at 245 for check-in then CXR and 315 w/ Tammy parrett NP At some point we will need CT imaging and f/u w/ McQuaid IF no resolution of CT changes   CARDIOVASCULAR A:  Hypertension P:  Stop benazepril due to cough Start losartan, repeat basic metabolic panel tomorrow  Rest per [facility]    03/21/2015, 11:36 AM

## 2015-03-21 NOTE — Progress Notes (Signed)
Occupational Therapy Evaluation Patient Details Name: Miranda [name] MRN: [id number] DOB: 01/14/1946 Today's Date: 03/21/2015    History of Present Illness Ms. Raimondo is a 70 year old female with a past medical history significant for HTN, HLD, DDD, asthma, anxiety; who presents with complaints of shortness of breath and cough   Clinical Impression   Patient admitted with above diagnoses. Patient currently with functional limitations due to the deficits listed below. Patient will benefit from skilled OT to increase independence and safety with ADL to allow discharge to the venue listed below.     Follow Up Recommendations    No OT follow-up   Equipment Recommendations    3 in 1 (though patient concerned about cost)   Recommendations for Other Services       Precautions / Restrictions Precautions Precautions: Fall Precaution Comments: monitor sats      Mobility Bed Mobility Overal bed mobility: Needs Assistance Bed Mobility: Supine to Sit     Supine to sit: Supervision     General bed mobility comments: incr time, effortful  Transfers Overall transfer level: Needs assistance Equipment used: None Transfers: Sit to/from [facility] Sit to Stand: Min assist Stand pivot transfers: Min assist;Min guard       General transfer comment: cues for safety and hand placement, requires incr time to complete task    Balance Overall balance assessment: Needs assistance           Standing balance-Leahy Scale: Fair                              ADL Overall ADL's : Needs assistance/impaired Eating/Feeding: Independent;Sitting   Grooming: Set up;Sitting   Upper Body Bathing: Set up;Sitting   Lower Body Bathing: Minimal assistance;Sit to/from stand   Upper Body Dressing : Set up;Sitting   Lower Body Dressing: Minimal assistance;Sit to/from stand               Functional mobility during ADLs:  (deferred as patient fatigued from PT  session just prior) General ADL Comments: Patient educated on and given handout for energy conservation techniques. Discussed modification of ADL tasks while she is recovering. Patient reports her sister will be able to assist her with any tasks necessary. Discussed DME needs -- patient interested in Rollator, will inform PT. OT recommends 3 in 1, and patient in agreement but concerned about cost.      Vision     Perception     Praxis      Pertinent Vitals/Pain Pain Assessment: No/denies pain     Hand Dominance Right   Extremity/Trunk Assessment Upper Extremity Assessment Upper Extremity Assessment: Generalized weakness   Lower Extremity Assessment Lower Extremity Assessment: Defer to PT evaluation       Communication Communication Communication: No difficulties   Cognition Arousal/Alertness: Awake/alert Behavior During Therapy: WFL for tasks assessed/performed Overall Cognitive Status: Within Functional Limits for tasks assessed                     General Comments       Exercises       Shoulder Instructions      Home Living Family/patient expects to be discharged to:: Private residence Living Arrangements: Spouse/significant other;Other relatives;Other (Comment) (plans to stay with her sister at discharge) Available Help at Discharge: Available 24 hours/day;Family Type of Home: Apartment Home Access: Elevator     Home Layout: One level     Bathroom Shower/Tub:  Tub/shower unit   Bathroom Toilet: Standard     Home Equipment: Cane - single point   Additional Comments: pt reports she did not use cane very often      Prior Functioning/Environment Level of Independence: Independent        Comments: works 1 day/week cleaning houses    OT Diagnosis: Generalized weakness   OT Problem List: Decreased strength;Decreased activity tolerance;Decreased knowledge of use of DME or AE;Cardiopulmonary status limiting activity   OT  Treatment/Interventions: Self-care/ADL training;Therapeutic exercise;DME and/or AE instruction;Therapeutic activities;Patient/family education    OT Goals(Current goals can be found in the care plan section) Acute Rehab OT Goals Patient Stated Goal: get better OT Goal Formulation: With patient Time For Goal Achievement: 04/04/15 Potential to Achieve Goals: Good  OT Frequency: Min 2X/week   Barriers to D/C:            Co-evaluation              End of Session Nurse Communication: Mobility status  Activity Tolerance: Patient tolerated treatment well;Patient limited by fatigue Patient left: in chair;with call bell/phone within reach   Time: 1029-1100 OT Time Calculation (min): 31 min Charges:  OT General Charges $OT Visit: 1 Procedure OT Evaluation $OT Eval Low Complexity: 1 Procedure OT Treatments $Self Care/Home Management : 8-22 mins G-Codes:    Makinsley Schiavi A 04/04/15, 11:40 AM

## 2015-03-22 LAB — CBC
HEMATOCRIT: 31.4 % — AB (ref 36.0–46.0)
HEMOGLOBIN: 9.7 g/dL — AB (ref 12.0–15.0)
MCH: 27.2 pg (ref 26.0–34.0)
MCHC: 30.9 g/dL (ref 30.0–36.0)
MCV: 88 fL (ref 78.0–100.0)
PLATELETS: 221 10*3/uL (ref 150–400)
RBC: 3.57 MIL/uL — AB (ref 3.87–5.11)
RDW: 14.4 % (ref 11.5–15.5)
WBC: 5.9 10*3/uL (ref 4.0–10.5)

## 2015-03-22 LAB — BASIC METABOLIC PANEL
ANION GAP: 9 (ref 5–15)
BUN: 14 mg/dL (ref 6–20)
CALCIUM: 8.6 mg/dL — AB (ref 8.9–10.3)
CO2: 30 mmol/L (ref 22–32)
Chloride: 105 mmol/L (ref 101–111)
Creatinine, Ser: 0.63 mg/dL (ref 0.44–1.00)
Glucose, Bld: 88 mg/dL (ref 65–99)
Potassium: 3.8 mmol/L (ref 3.5–5.1)
Sodium: 144 mmol/L (ref 135–145)

## 2015-03-22 LAB — ANCA TITERS
Atypical P-ANCA titer: <1:20 {titer}
P-ANCA: <1:20 {titer}

## 2015-03-22 LAB — ANTINUCLEAR ANTIBODIES, IFA: ANA Ab, IFA: NEGATIVE

## 2015-03-22 LAB — MPO/PR-3 (ANCA) ANTIBODIES
ANCA Proteinase 3: <3.5 U/mL (ref 0.0–3.5)
Myeloperoxidase Abs: <9 U/mL (ref 0.0–9.0)

## 2015-03-22 LAB — RHEUMATOID FACTOR: RHEUMATOID FACTOR: 13.6 [IU]/mL (ref 0.0–13.9)

## 2015-03-22 NOTE — Care Management Note (Signed)
Case Management Note  Patient Details  Name: KELSE LOMELINO MRN: [id number] Date of Birth: 09/14/1945  Subjective/Objective:   70 yo admitted with Acute respiratory failure                 Action/Plan: From home with spouse  Expected Discharge Date:   (unknown)               Expected Discharge Plan:  [place]  In-House Referral:     Discharge planning Services  CM Consult  Post Acute Care Choice:  Home Health Choice offered to:  Patient  DME Arranged:  3-N-1, Walker rolling (rollator) DME Agency:  [place]:  PT [place] Agency:  [place]  Status of Service:  In process, will continue to follow  Medicare Important Message Given:  Yes Date Medicare IM Given:    Medicare IM give by:    Date Additional Medicare IM Given:    Additional Medicare Important Message give by:     If discussed at [place] of Stay Meetings, dates discussed:    Additional Comments: PT recommending HHPT. Pt offered choice for [facility] services and chose [facility]. [facility] rep given referral. Pt wanting rollator and 3 in 1 for home. Orders in for DME and [facility] DME rep notified. DME to be delivered to the home per pt request. Pt will need HHPT order and face to face prior to discharge. CM will continue to follow. Lynnell Catalan, RN 03/22/2015, 1:11 PM

## 2015-03-22 NOTE — Progress Notes (Signed)
Occupational Therapy Treatment Patient Details Name: Miranda [name] MRN: [id number] DOB: 1946-01-10 Today's Date: 03/22/2015    History of present illness Miranda [name] is a 70 year old female with a past medical history significant for HTN, HLD, DDD, asthma, anxiety; who presents with complaints of shortness of breath and cough   OT comments  Patient making progress towards OT goals. Continue OT plan of care.  Follow Up Recommendations  No OT follow up;Supervision/Assistance - 24 hour    Equipment Recommendations  3 in 1 bedside comode    Recommendations for Other Services      Precautions / Restrictions Precautions Precautions: Fall Precaution Comments: monitor sats       Mobility Bed Mobility Overal bed mobility: Needs Assistance Bed Mobility: Supine to Sit     Supine to sit: Supervision        Transfers Overall transfer level: Needs assistance Equipment used: None Transfers: Sit to/from Stand Sit to Stand: Min guard         General transfer comment: initially unsteady upon standing; however she self-corrected quickly    Balance                                   ADL Overall ADL's : Needs assistance/impaired                 Upper Body Dressing : Set up;Sitting       Toilet Transfer: Min guard   Toileting- Clothing Manipulation and Hygiene: Min guard       Functional mobility during ADLs: Min guard General ADL Comments: Reviewed energy conservation techniques with patient. Patient's O2 sats dropped to 80% with activity on room air. Reapplied O2 via [place] and O2 sats up to 91% after 30 seconds to 1 minute. Educated on pursed lip breathing techniques.      Vision                     Perception     Praxis      Cognition   Behavior During Therapy: WFL for tasks assessed/performed Overall Cognitive Status: Within Functional Limits for tasks assessed                       Extremity/Trunk Assessment               Exercises     Shoulder Instructions       General Comments      Pertinent Vitals/ Pain       Pain Assessment: 0-10 Pain Score: 6  Pain Location: headache Pain Descriptors / Indicators: Aching Pain Intervention(s): Limited activity within patient's tolerance;Monitored during session;Patient requesting pain meds-RN notified  Home Living                                          Prior Functioning/Environment              Frequency Min 2X/week     Progress Toward Goals  OT Goals(current goals can now be found in the care plan section)  Progress towards OT goals: Progressing toward goals     Plan Discharge plan remains appropriate    Co-evaluation                 End of Session Equipment Utilized During Treatment: Oxygen  Activity Tolerance Patient tolerated treatment well   Patient Left in bed;with call bell/phone within reach   Nurse Communication Patient requests pain meds        Time: [id number] OT Time Calculation (min): 14 min  Charges: OT General Charges $OT Visit: 1 Procedure OT Treatments $Self Care/Home Management : 8-22 mins  Miranda [name] A 03/22/2015, 11:02 AM

## 2015-03-22 NOTE — Progress Notes (Signed)
[facility] PROGRESS NOTE  Miranda [name] [id number] DOB: 06-Sep-1945 DOA: 03/18/2015 PCP: Nyoka Cowden, MD  Brief interval history 70 year old with history of HTN, HLD, asthma, anxiety, lives with daughter, has never smoked but exposed to heavy passive smoking from daughter, treated for acute bronchitis with antibiotics in November, transiently improved and has been progressively worsening with productive cough, dyspnea, intermittent fevers, blood-tinged sputum. Admitted for evaluation and management of possible community-acquired pneumonia. CCM consulted given significantly abnormal chest x-ray.   Assessment/Plan: CAP/possible interstitial lung disease - Patient being treated for community acquired pneumonia per chest x-ray.  - CT chest shows unusual interstitial pneumonia and possible alveolar hemorrhage.  - ANA, RF have been ordered and currently pending. Clinical improvement.  - Treated initially with IV Rocephin and IV azithromycin. Transitioned to oral antibiotics Ceftin & Azithromycin - Patient will likely need repeat CT chest in about 6-8 weeks-outpatient follow-up with Dr. [name] .  - Benazepril and dulera have been discontinued per pulmonary medicine.  - Patient to follow-up with pulmonary medicine on 04/05/2015 at 2:45 PM for chest x-ray and then at 3:15 for follow-up with Rexene Edison, NP.  Hypertension ACE inhibitor have been discontinued secondary to cough. Continue ARB, metoprolol, Norvasc. mildly uncontrolled.   Asthma Stable.  Anemia Stable  Hyperlipidemia Stable. Continue statin.  Anxiety Continue Lexapro and Xanax.  Chronic back pain Continue current pain regimen.  Gastroesophageal reflux disease PPI.   DVT prophylaxis: SCD's Code Status: Full Family Communication: Updated patient. No family at bedside. Disposition Plan: Home possibly 1/7   Consultants:  PCCM: Dr [name] 03/19/2015  Procedures:  CT chest  03/19/2015  Chest x-ray 03/18/2015    Antibiotics:  IV Rocephin 03/18/2015 >>>>>> 03/21/2015  IV azithromycin 03/18/2015>>>>>> 03/21/2015  oral ceftin and oral azithromycin 03/22/2015  HPI/Subjective: Improved. Cough with intermittent mild green sputum but definitely decreased compared to admission. No hemoptysis. Intermittent dyspnea on exertion.   Objective:  Filed Vitals:   03/22/15 0700 03/22/15 0920 03/22/15 1300 03/22/15 1452  BP:   154/80   Pulse:   65   Temp:   98.3 F (36.8 C)   TempSrc:   Oral   Resp:   18   Height:      Weight:      SpO2: 99% 98% 99% 94%    No intake or output data in the 24 hours ending 03/22/15 1801 Filed Weights   03/18/15 2000  Weight: 94.348 kg (208 lb)    Exam:   General:  Pleasant middle-aged female sitting comfortably propped up in bed. Looks much improved compared to 72 hours ago.  Cardiovascular: S1 and S2 heard, RRR. No JVD, murmurs or pedal edema. Telemetry: SB in the 50s-SR.   Respiratory: improved breath sounds bilaterally. Still slightly harsh. No increased work of breathing.   Abdomen: Soft, nontender, nondistended, positive bowel sounds.  Musculoskeletal: No clubbing cyanosis or edema.   CNS: Alert and oriented. No focal deficits.  Data Reviewed: Basic Metabolic Panel:  Recent Labs Lab 03/18/15 1622 03/19/15 0520 03/20/15 0530 03/21/15 0539 03/22/15 0530  NA 141 141 146* 144 144  K 4.0 4.0 5.0 4.0 3.8  CL 105 110 111 108 105  CO2 26 26 28 28 30   GLUCOSE 104* 102* 121* 113* 88  BUN 10 11 13 12 14   CREATININE 0.67 0.65 0.65 0.64 0.63  CALCIUM 9.2 8.7* 9.0 8.6* 8.6*   Liver Function Tests:  Recent Labs Lab 03/19/15 0520  AST 18  ALT 20  ALKPHOS 39  BILITOT  0.9  PROT 6.8  ALBUMIN 3.1*   No results for input(s): LIPASE, AMYLASE in the last 168 hours. No results for input(s): AMMONIA in the last 168 hours. CBC:  Recent Labs Lab 03/18/15 1622 03/19/15 0520 03/20/15 0530 03/21/15 0539  03/22/15 0530  WBC 11.0* 6.5 7.1 6.5 5.9  HGB 11.5* 10.0* 10.1* 9.7* 9.7*  HCT 36.0 31.7* 32.5* 31.3* 31.4*  MCV 86.5 87.1 88.6 86.7 88.0  PLT 223 200 221 208 221   Cardiac Enzymes:  Recent Labs Lab 03/18/15 2200  TROPONINI <0.03   BNP (last 3 results) No results for input(s): BNP in the last 8760 hours.  ProBNP (last 3 results) No results for input(s): PROBNP in the last 8760 hours.  CBG: No results for input(s): GLUCAP in the last 168 hours.  Recent Results (from the past 240 hour(s))  Culture, blood (Routine X 2) w Reflex to ID Panel     Status: None (Preliminary result)   Collection Time: 03/18/15  6:01 PM  Result Value Ref Range Status   Specimen Description LEFT ANTECUBITAL  Final   Special Requests BOTTLES DRAWN AEROBIC AND ANAEROBIC 5CC  Final   Culture   Final    NO GROWTH 4 DAYS Performed at [facility]    Report Status PENDING  Incomplete  Culture, blood (Routine X 2) w Reflex to ID Panel     Status: None (Preliminary result)   Collection Time: 03/18/15  6:20 PM  Result Value Ref Range Status   Specimen Description BLOOD RIGHT HAND  Final   Special Requests IN PEDIATRIC BOTTLE 3CC  Final   Culture   Final    NO GROWTH 4 DAYS Performed at [facility]    Report Status PENDING  Incomplete  Culture, sputum-assessment     Status: None   Collection Time: 03/19/15  6:19 AM  Result Value Ref Range Status   Specimen Description SPUTUM  Final   Special Requests Normal  Final   Sputum evaluation   Final    MICROSCOPIC FINDINGS SUGGEST THAT THIS SPECIMEN IS NOT REPRESENTATIVE OF LOWER RESPIRATORY SECRETIONS. PLEASE RECOLLECT. NOTIFIED RN AT [phone] ON 1.3.17 BY SHUEA    Report Status 03/19/2015 FINAL  Final  Culture, expectorated sputum-assessment     Status: None   Collection Time: 03/20/15  7:39 AM  Result Value Ref Range Status   Specimen Description SPUTUM  Final   Special Requests NONE  Final   Sputum evaluation   Final    MICROSCOPIC  FINDINGS SUGGEST THAT THIS SPECIMEN IS NOT REPRESENTATIVE OF LOWER RESPIRATORY SECRETIONS. PLEASE RECOLLECT. NOTIFIED NAKIA RN AT 0830 ON 1.4.17 BY SHUEA    Report Status 03/20/2015 FINAL  Final     Studies: No results found.  Scheduled Meds: . amLODipine  5 mg Oral Daily  . azithromycin  500 mg Oral Daily  . cefUROXime  500 mg Oral BID WC  . escitalopram  5 mg Oral Daily  . feeding supplement (ENSURE ENLIVE)  237 mL Oral BID BM  . furosemide  40 mg Oral Daily  . guaiFENesin  1,200 mg Oral BID  . ipratropium-albuterol  3 mL Nebulization TID  . latanoprost  1 drop Both Eyes QHS  . Linaclotide  290 mcg Oral Daily  . losartan  50 mg Oral Daily  . meloxicam  15 mg Oral Daily  . metoprolol  50 mg Oral BID  . pantoprazole  40 mg Oral Daily  . potassium chloride SA  20 mEq Oral  Daily  . pravastatin  40 mg Oral q1800   Continuous Infusions:   Principal Problem:   Acute respiratory failure with hypoxia (HCC) Active Problems:   Essential hypertension   Asthma   GERD   LOW BACK PAIN   CAP (community acquired pneumonia)   Anemia    Time spent: 64 mins   Kivon Aprea, MD, FACP, FHM. [facility] Pager [phone]  If 7PM-7AM, please contact night-coverage [email or web address] Password [email or web address] 03/22/2015, 6:09 PM    LOS: 4 days

## 2015-03-23 LAB — CULTURE, BLOOD (ROUTINE X 2)
CULTURE: NO GROWTH
Culture: NO GROWTH

## 2015-03-23 MED ORDER — CEFUROXIME AXETIL 500 MG PO TABS: 500.0000 mg | ORAL_TABLET | Freq: Two times a day (BID) | ORAL | Status: DC

## 2015-03-23 MED ORDER — LOSARTAN POTASSIUM 50 MG PO TABS: 50.0000 mg | ORAL_TABLET | Freq: Every day | ORAL | Status: DC

## 2015-03-23 MED ORDER — ENSURE ENLIVE PO LIQD: 237.0000 mL | Freq: Two times a day (BID) | ORAL | Status: DC

## 2015-03-23 MED ORDER — BENZONATATE 200 MG PO CAPS: 200.0000 mg | ORAL_CAPSULE | Freq: Two times a day (BID) | ORAL | Status: DC | PRN

## 2015-03-23 MED ORDER — ALPRAZOLAM 0.25 MG PO TABS: 0.2500 mg | ORAL_TABLET | Freq: Three times a day (TID) | ORAL | Status: DC | PRN

## 2015-03-23 MED ORDER — AZITHROMYCIN 500 MG PO TABS: 500.0000 mg | ORAL_TABLET | Freq: Every day | ORAL | Status: DC

## 2015-03-23 NOTE — Discharge Instructions (Signed)

## 2015-03-23 NOTE — Progress Notes (Signed)
Patient's O2 saturation monitored while patient walked in hallway. SaO2 dropped to 89% on RA. SaO2 remained at 92% while sitting in chair. Patient had difficult time ambulation d/t weakness. Dr. Algis Liming notified.

## 2015-03-23 NOTE — Discharge Summary (Signed)
Physician Discharge Summary  Miranda [name] [id number] DOB: 1946/03/08 DOA: 03/18/2015  PCP: Nyoka Cowden, MD  Admit date: 03/18/2015 Discharge date: 03/23/2015  Time spent: Greater than 30 minutes  Recommendations for Outpatient Follow-up:  1. Dr. Bluford Kaufmann, PCP in 3 days. 2. Tammy Parrett, NP/pulmonology on 04/05/2015 at 3:15 PM with repeat CXR. 3. Home Health PT, 3 n 1 & Rolling walker.  Discharge Diagnoses:  Principal Problem:   Acute respiratory failure with hypoxia ([place]) Active Problems:   Essential hypertension   Asthma   GERD   LOW BACK PAIN   CAP (community acquired pneumonia)   Anemia   Discharge Condition: Improved & Stable  Diet recommendation: Heart healthy diet.  Filed Weights   03/18/15 2000  Weight: 94.348 kg (208 lb)    History of present illness:  70 year old with history of HTN, HLD, asthma, anxiety, lives with daughter, has never smoked but exposed to heavy passive smoking from daughter, treated for acute bronchitis with antibiotics in November, transiently improved and has been progressively worsening with productive cough, dyspnea, intermittent fevers, blood-tinged sputum. Admitted for evaluation and management of possible community-acquired pneumonia. CCM consulted given significantly abnormal chest x-ray.  Hospital Course:   CAP/possible interstitial lung disease - Patient treated for community acquired pneumonia.  - CT chest shows usual interstitial pneumonia(UIP) and possible alveolar hemorrhage.  - Autoimmune workup: ANA, ANCA, myeloperoxidase antibodies, rheumatoid factor, c-ANCA, p-ANCA & atypical p-ANCA: negative. - Treated initially with IV Rocephin and IV azithromycin. Transitioned to oral antibiotics Ceftin & Azithromycin - Patient will likely need repeat CT chest in about 6-8 weeks-outpatient follow-up with Dr. [name] .  - Benazepril and dulera have been discontinued per pulmonary medicine.  - Patient to follow-up  with pulmonary medicine on 04/05/2015 at 2:45 PM for chest x-ray and then at 3:15 for follow-up with Rexene Edison, NP. - Clinically improved. Evaluated for home oxygen: Does not qualify. Saturated at 89% on room air with activity. - Patient indicates that she has family at home who can provide 24/7 assistance and supervision. She was advised to gradually increase activity as tolerated.  Hypertension ACE inhibitor have been discontinued secondary to cough. Continue ARB, metoprolol, Norvasc. mildly uncontrolled.   Asthma Stable.  Anemia Stable  Hyperlipidemia Stable. Continue statin.  Anxiety Continue Lexapro and Xanax.  Chronic back pain Continue current pain regimen.  Gastroesophageal reflux disease PPI.    Consultants:  PCCM  Procedures:  CT chest 03/19/2015  Chest x-ray 03/18/2015  Antibiotics:  IV Rocephin 03/18/2015 >>>>>> 03/21/2015  IV azithromycin 03/18/2015>>>>>> 03/21/2015  oral ceftin and oral azithromycin 03/22/2015 >   Discharge Exam:  Complaints: States that she feels more than 50% better compared to admission. Intermittent dyspnea-mostly on exertion. Minimal intermittent dry cough. No chest pain.  Filed Vitals:   03/23/15 0902 03/23/15 1045 03/23/15 1046 03/23/15 1048  BP:      Pulse:  86 79 106  Temp:      TempSrc:      Resp:      Height:      Weight:      SpO2: 96% 96% 98% 89%   temperature 97.2F, respiration 12 per minute, BP 147/61   General: Pleasant middle-aged female sitting comfortably propped up in bed.   Cardiovascular: S1 and S2 heard, RRR. No JVD, murmurs or pedal edema. Telemetry: SB in the 50s-SR.   Respiratory: improved breath sounds bilaterally. No wheezing or rhonchi. Occasional basal fine crackles. No increased work of breathing.   Abdomen: Soft, nontender, nondistended,  positive bowel sounds.  Musculoskeletal: No clubbing cyanosis or edema.  CNS: Alert and oriented. No focal deficits.  Discharge  Instructions      Discharge Instructions    Call MD for:  difficulty breathing, headache or visual disturbances    Complete by:  As directed      Call MD for:  extreme fatigue    Complete by:  As directed      Call MD for:  persistant dizziness or light-headedness    Complete by:  As directed      Call MD for:  persistant nausea and vomiting    Complete by:  As directed      Call MD for:  severe uncontrolled pain    Complete by:  As directed      Call MD for:  temperature >100.4    Complete by:  As directed      Diet - low sodium heart healthy    Complete by:  As directed      Increase activity slowly    Complete by:  As directed             Medication List    STOP taking these medications        benazepril 40 MG tablet  Commonly known as:  LOTENSIN     clarithromycin 500 MG tablet  Commonly known as:  BIAXIN     hydrochlorothiazide 25 MG tablet  Commonly known as:  HYDRODIURIL     HYDROcodone-homatropine 5-1.5 MG/5ML syrup  Commonly known as:  HYDROMET      TAKE these medications        albuterol 108 (90 Base) MCG/ACT inhaler  Commonly known as:  PROVENTIL HFA;VENTOLIN HFA  Inhale 2 puffs into the lungs every 6 (six) hours as needed.     ALPRAZolam 0.25 MG tablet  Commonly known as:  XANAX  Take 1 tablet (0.25 mg total) by mouth 3 (three) times daily as needed for anxiety or sleep.     amLODipine 5 MG tablet  Commonly known as:  NORVASC  Take 1 tablet (5 mg total) by mouth daily.     azithromycin 500 MG tablet  Commonly known as:  ZITHROMAX  Take 1 tablet (500 mg total) by mouth daily.  Start taking on:  03/24/2015     benzonatate 200 MG capsule  Commonly known as:  TESSALON  Take 1 capsule (200 mg total) by mouth 2 (two) times daily as needed for cough.     cefUROXime 500 MG tablet  Commonly known as:  CEFTIN  Take 1 tablet (500 mg total) by mouth 2 (two) times daily with a meal.     cyclobenzaprine 10 MG tablet  Commonly known as:  FLEXERIL   Take 1 tablet (10 mg total) by mouth 3 (three) times daily as needed.     escitalopram 5 MG tablet  Commonly known as:  LEXAPRO  Take 1 tablet (5 mg total) by mouth daily.     feeding supplement (ENSURE ENLIVE) Liqd  Take 237 mLs by mouth 2 (two) times daily between meals.     Fluticasone-Salmeterol 250-50 MCG/DOSE Aepb  Commonly known as:  ADVAIR  Inhale 1 puff into the lungs every 12 (twelve) hours.     furosemide 40 MG tablet  Commonly known as:  LASIX  Take 1 tablet (40 mg total) by mouth daily.     HYDROcodone-acetaminophen 10-325 MG tablet  Commonly known as:  NORCO  Take 1 tablet by mouth every  6 (six) hours as needed.     lactulose 10 GM/15ML solution  Commonly known as:  CHRONULAC  Take 30 mLs (20 g total) by mouth 3 (three) times daily as needed for mild constipation.     Linaclotide 290 MCG Caps capsule  Commonly known as:  LINZESS  Take 1 capsule (290 mcg total) by mouth daily.     losartan 50 MG tablet  Commonly known as:  COZAAR  Take 1 tablet (50 mg total) by mouth daily.     lovastatin 40 MG tablet  Commonly known as:  MEVACOR  Take 2 tablets (80 mg total) by mouth at bedtime.     meloxicam 15 MG tablet  Commonly known as:  MOBIC  Take 1 tablet (15 mg total) by mouth daily.     metoprolol 50 MG tablet  Commonly known as:  LOPRESSOR  Take 1 tablet (50 mg total) by mouth 2 (two) times daily.     omeprazole 20 MG capsule  Commonly known as:  PRILOSEC  TAKE TWO CAPSULES BY MOUTH ONCE DAILY     potassium chloride SA 20 MEQ tablet  Commonly known as:  K-DUR,KLOR-CON  Take 1 tablet (20 mEq total) by mouth daily.     travoprost (benzalkonium) 0.004 % ophthalmic solution  Commonly known as:  TRAVATAN  Place 1 drop into both eyes at bedtime.       Follow-up Information    Follow up with PARRETT,TAMMY, NP On 04/05/2015.   Specialty:  Pulmonary Disease   Why:  245 pm for check-in then xray; then appointment w/ Tammy at 315   Contact information:    [address] [place] [address] [phone]       Follow up with [facility].   Contact information:   [address] [place] [address] [phone]       Follow up with Nyoka Cowden, MD. Schedule an appointment as soon as possible for a visit in 3 days.   Specialty:  Internal Medicine   Contact information:   [place] [address] [phone]        The results of significant diagnostics from this hospitalization (including imaging, microbiology, ancillary and laboratory) are listed below for reference.    Significant Diagnostic Studies: Dg Chest 2 View  03/18/2015  CLINICAL DATA:  Shortness of breath. Mid to LEFT chest pain for 1 week. Coughing up blood for 4 days. EXAM: CHEST  2 VIEW COMPARISON:  70 13. FINDINGS: Normal cardiac size. Difficult to exclude hilar adenopathy, possibly greater on the RIGHT. BILATERAL perihilar opacities are seen worst in the RIGHT upper lobe, which in the appropriate clinical setting could represent pneumonia, hemorrhage, or tumor. Trace blunting both CP angles. No osseous findings. IMPRESSION: BILATERAL pulmonary opacities, worst in the RIGHT upper lobe. See discussion above. CT chest with contrast may be helpful in further evaluation of extent and pathology. Electronically Signed   By: Staci Righter M.D.   On: 03/18/2015 16:49   Ct Chest Wo Contrast  03/19/2015  CLINICAL DATA:  70 year old female with history of community acquired pneumonia. Intermittent hemoptysis for 1 week. Shortness of breath. Nonsmoker. EXAM: CT CHEST WITHOUT CONTRAST TECHNIQUE: Multidetector CT imaging of the chest was performed following the standard protocol without IV contrast. COMPARISON:  No prior chest CT.  Chest x-ray 03/18/2015. FINDINGS: Mediastinum/Lymph Nodes: Heart size is mildly enlarged. There is no significant pericardial fluid, thickening or pericardial calcification. There is atherosclerosis of  the  thoracic aorta, the great vessels of the mediastinum and the coronary arteries, including calcified atherosclerotic plaque in the left main and left circumflex coronary arteries. There are multiple borderline enlarged and mildly enlarged mediastinal lymph nodes, measuring up to 12 mm in the anterior mediastinum and in the right paratracheal nodal station. Fullness in the hilar regions could suggest additional lymphadenopathy, but is poorly evaluated on today's noncontrast CT examination. Esophagus is unremarkable in appearance. No axillary lymphadenopathy. Lungs/Pleura: There are patchy areas of dense ground-glass attenuation scattered throughout the lungs bilaterally, rather confluent in the central aspects of the right lung, and more nodular and patchy in distribution in the left lung. In the areas of greatest involvement, particularly in the right upper lobe, there appears to be some associated interlobular septal thickening. Small right pleural effusion. Peripheral areas of subpleural reticulation with some areas of mild traction cylindrical bronchiectasis and peripheral bronchiolectasis in the lung bases, and a few areas that are concerning for developing honeycombing (best appreciated in the left lower lobe on image 35 of series 5). These fibrotic changes are not noted in the upper lungs. Upper Abdomen: Unremarkable. Musculoskeletal/Soft Tissues: There are no aggressive appearing lytic or blastic lesions noted in the visualized portions of the skeleton. IMPRESSION: 1. There is a complex spectrum of findings on today's examination, which is favored to be related to at least 2 separate processes. Specifically, there is evidence on today's examination of interstitial lung disease, with findings concerning for early usual interstitial pneumonia (UIP) throughout the lung bases. These findings have slightly progressed compared to prior CT of the abdomen and pelvis from 09/14/2011. In addition, there is an  impressive amount of ground-glass attenuation, some of which has associated interlobular septal thickening. Given the patient's history of hemoptysis, many these areas may simply represent regions of alveolar hemorrhage. Similar findings can be seen in the setting of pneumonia, particularly with viral or other atypical organisms. Alternatively, the possibility of an infiltrative neoplasm such as adenocarcinoma could be considered, however, the findings on today's examination are quite extensive, and it would be unusual for findings such as these to develop since prior chest x-ray 10/01/2011 if indeed this is a slow-growing neoplasm such as an adenocarcinoma. Continued imaging surveillance is recommended to ensure resolution of these findings with repeat standing PA and lateral chest radiographs in the next 2-3 weeks. 2. Regardless of what happens acutely, followup high-resolution chest CT is suggested in the next 6-12 months to assess for further temporal changes in the appearance of the lung parenchyma, and further evaluation for potential interstitial lung disease is recommended in the near future. 3. Atherosclerosis, including left main and left circumflex coronary artery disease. Please note that although the presence of coronary artery calcium documents the presence of coronary artery disease, the severity of this disease and any potential stenosis cannot be assessed on this non-gated CT examination. Assessment for potential risk factor modification, dietary therapy or pharmacologic therapy may be warranted, if clinically indicated. Electronically Signed   By: Vinnie Langton M.D.   On: 03/19/2015 14:54    Microbiology: Recent Results (from the past 240 hour(s))  Culture, blood (Routine X 2) w Reflex to ID Panel     Status: None   Collection Time: 03/18/15  6:01 PM  Result Value Ref Range Status   Specimen Description LEFT ANTECUBITAL  Final   Special Requests BOTTLES DRAWN AEROBIC AND ANAEROBIC 5CC   Final   Culture   Final    NO GROWTH 5 DAYS Performed  at [facility]    Report Status 03/23/2015 FINAL  Final  Culture, blood (Routine X 2) w Reflex to ID Panel     Status: None   Collection Time: 03/18/15  6:20 PM  Result Value Ref Range Status   Specimen Description BLOOD RIGHT HAND  Final   Special Requests IN PEDIATRIC BOTTLE 3CC  Final   Culture   Final    NO GROWTH 5 DAYS Performed at [facility]    Report Status 03/23/2015 FINAL  Final  Culture, sputum-assessment     Status: None   Collection Time: 03/19/15  6:19 AM  Result Value Ref Range Status   Specimen Description SPUTUM  Final   Special Requests Normal  Final   Sputum evaluation   Final    MICROSCOPIC FINDINGS SUGGEST THAT THIS SPECIMEN IS NOT REPRESENTATIVE OF LOWER RESPIRATORY SECRETIONS. PLEASE RECOLLECT. NOTIFIED RN AT [phone] ON 1.3.17 BY SHUEA    Report Status 03/19/2015 FINAL  Final  Culture, expectorated sputum-assessment     Status: None   Collection Time: 03/20/15  7:39 AM  Result Value Ref Range Status   Specimen Description SPUTUM  Final   Special Requests NONE  Final   Sputum evaluation   Final    MICROSCOPIC FINDINGS SUGGEST THAT THIS SPECIMEN IS NOT REPRESENTATIVE OF LOWER RESPIRATORY SECRETIONS. PLEASE RECOLLECT. NOTIFIED NAKIA RN AT 0830 ON 1.4.17 BY SHUEA    Report Status 03/20/2015 FINAL  Final     Labs: Basic Metabolic Panel:  Recent Labs Lab 03/18/15 1622 03/19/15 0520 03/20/15 0530 03/21/15 0539 03/22/15 0530  NA 141 141 146* 144 144  K 4.0 4.0 5.0 4.0 3.8  CL 105 110 111 108 105  CO2 26 26 28 28 30   GLUCOSE 104* 102* 121* 113* 88  BUN 10 11 13 12 14   CREATININE 0.67 0.65 0.65 0.64 0.63  CALCIUM 9.2 8.7* 9.0 8.6* 8.6*   Liver Function Tests:  Recent Labs Lab 03/19/15 0520  AST 18  ALT 20  ALKPHOS 39  BILITOT 0.9  PROT 6.8  ALBUMIN 3.1*   No results for input(s): LIPASE, AMYLASE in the last 168 hours. No results for input(s): AMMONIA in the last 168  hours. CBC:  Recent Labs Lab 03/18/15 1622 03/19/15 0520 03/20/15 0530 03/21/15 0539 03/22/15 0530  WBC 11.0* 6.5 7.1 6.5 5.9  HGB 11.5* 10.0* 10.1* 9.7* 9.7*  HCT 36.0 31.7* 32.5* 31.3* 31.4*  MCV 86.5 87.1 88.6 86.7 88.0  PLT 223 200 221 208 221   Cardiac Enzymes:  Recent Labs Lab 03/18/15 2200  TROPONINI <0.03   BNP: BNP (last 3 results) No results for input(s): BNP in the last 8760 hours.  ProBNP (last 3 results) No results for input(s): PROBNP in the last 8760 hours.  CBG: No results for input(s): GLUCAP in the last 168 hours.      Signed:  Vernell Leep, MD, FACP, FHM. [facility] Pager [phone]  If 7PM-7AM, please contact night-coverage [email or web address] Password [email or web address] 03/23/2015, 1:57 PM

## 2015-03-25 DIAGNOSIS — E785 Hyperlipidemia, unspecified: Secondary | ICD-10-CM | POA: Diagnosis not present

## 2015-03-25 DIAGNOSIS — K219 Gastro-esophageal reflux disease without esophagitis: Secondary | ICD-10-CM | POA: Diagnosis not present

## 2015-03-25 DIAGNOSIS — J189 Pneumonia, unspecified organism: Secondary | ICD-10-CM | POA: Diagnosis not present

## 2015-03-25 DIAGNOSIS — J45909 Unspecified asthma, uncomplicated: Secondary | ICD-10-CM | POA: Diagnosis not present

## 2015-03-25 DIAGNOSIS — M549 Dorsalgia, unspecified: Secondary | ICD-10-CM | POA: Diagnosis not present

## 2015-03-25 DIAGNOSIS — I10 Essential (primary) hypertension: Secondary | ICD-10-CM | POA: Diagnosis not present

## 2015-03-25 DIAGNOSIS — D649 Anemia, unspecified: Secondary | ICD-10-CM | POA: Diagnosis not present

## 2015-03-26 ENCOUNTER — Telehealth: Payer: Self-pay | Admitting: Internal Medicine

## 2015-03-26 NOTE — Telephone Encounter (Signed)
First TCM attempt

## 2015-03-27 DIAGNOSIS — M549 Dorsalgia, unspecified: Secondary | ICD-10-CM | POA: Diagnosis not present

## 2015-03-27 DIAGNOSIS — J189 Pneumonia, unspecified organism: Secondary | ICD-10-CM | POA: Diagnosis not present

## 2015-03-27 DIAGNOSIS — K219 Gastro-esophageal reflux disease without esophagitis: Secondary | ICD-10-CM | POA: Diagnosis not present

## 2015-03-27 DIAGNOSIS — J45909 Unspecified asthma, uncomplicated: Secondary | ICD-10-CM | POA: Diagnosis not present

## 2015-03-27 DIAGNOSIS — D649 Anemia, unspecified: Secondary | ICD-10-CM | POA: Diagnosis not present

## 2015-03-27 DIAGNOSIS — E785 Hyperlipidemia, unspecified: Secondary | ICD-10-CM | POA: Diagnosis not present

## 2015-03-27 DIAGNOSIS — I10 Essential (primary) hypertension: Secondary | ICD-10-CM | POA: Diagnosis not present

## 2015-03-27 NOTE — Telephone Encounter (Signed)
2nd attempt

## 2015-04-01 DIAGNOSIS — E785 Hyperlipidemia, unspecified: Secondary | ICD-10-CM | POA: Diagnosis not present

## 2015-04-01 DIAGNOSIS — D649 Anemia, unspecified: Secondary | ICD-10-CM | POA: Diagnosis not present

## 2015-04-01 DIAGNOSIS — K219 Gastro-esophageal reflux disease without esophagitis: Secondary | ICD-10-CM | POA: Diagnosis not present

## 2015-04-01 DIAGNOSIS — I10 Essential (primary) hypertension: Secondary | ICD-10-CM | POA: Diagnosis not present

## 2015-04-01 DIAGNOSIS — J45909 Unspecified asthma, uncomplicated: Secondary | ICD-10-CM | POA: Diagnosis not present

## 2015-04-01 DIAGNOSIS — M549 Dorsalgia, unspecified: Secondary | ICD-10-CM | POA: Diagnosis not present

## 2015-04-01 DIAGNOSIS — J189 Pneumonia, unspecified organism: Secondary | ICD-10-CM | POA: Diagnosis not present

## 2015-04-04 ENCOUNTER — Encounter: Payer: Self-pay | Admitting: Internal Medicine

## 2015-04-04 ENCOUNTER — Ambulatory Visit (INDEPENDENT_AMBULATORY_CARE_PROVIDER_SITE_OTHER): Payer: Medicare Other | Admitting: Internal Medicine

## 2015-04-04 VITALS — BP 150/80 | HR 54 | Temp 98.3°F | Resp 20 | Ht 65.0 in | Wt 200.0 lb

## 2015-04-04 DIAGNOSIS — I10 Essential (primary) hypertension: Secondary | ICD-10-CM | POA: Diagnosis not present

## 2015-04-04 DIAGNOSIS — J9601 Acute respiratory failure with hypoxia: Secondary | ICD-10-CM

## 2015-04-04 DIAGNOSIS — J189 Pneumonia, unspecified organism: Secondary | ICD-10-CM | POA: Diagnosis not present

## 2015-04-04 DIAGNOSIS — E785 Hyperlipidemia, unspecified: Secondary | ICD-10-CM

## 2015-04-04 MED ORDER — HYDROCODONE-ACETAMINOPHEN 10-325 MG PO TABS: 1.0000 | ORAL_TABLET | Freq: Four times a day (QID) | ORAL | Status: DC | PRN

## 2015-04-04 NOTE — Patient Instructions (Signed)
Pulmonary follow-up as scheduled  Return here in 6 months for follow-up or as needed  Limit your sodium (Salt) intake  Slowly resume your usual level of activity

## 2015-04-04 NOTE — Progress Notes (Signed)
Subjective:    Patient ID: Miranda [name], female    DOB: Feb 17, 1946, 70 y.o.   MRN: [id number]  HPI Admit date: 03/18/2015 Discharge date: 03/23/2015  Recommendations for Outpatient Follow-up:  1. Dr. Bluford Kaufmann, PCP in 3 days. 2. Tammy Parrett, NP/pulmonology on 04/05/2015 at 3:15 PM with repeat CXR. 3. Home Health PT, 3 n 1 & Rolling walker.  Discharge Diagnoses:  Principal Problem:  Acute respiratory failure with hypoxia (HCC) Active Problems:  Essential hypertension  Asthma  GERD  LOW BACK PAIN  CAP (community acquired pneumonia)  Anemia   Filed Weights   03/18/15 2000  Weight: 94.348 kg (208 lb)    History of present illness:  70 year old with history of HTN, HLD, asthma, anxiety, lives with daughter, has never smoked but exposed to heavy passive smoking from daughter, treated for acute bronchitis with antibiotics in November, transiently improved and has been progressively worsening with productive cough, dyspnea, intermittent fevers, blood-tinged sputum. Admitted for evaluation and management of possible community-acquired pneumonia. CCM consulted given significantly abnormal chest x-ray.  Hospital Course:   CAP/possible interstitial lung disease - Patient treated for community acquired pneumonia.  - CT chest shows usual interstitial pneumonia(UIP) and possible alveolar hemorrhage.  - Autoimmune workup: ANA, ANCA, myeloperoxidase antibodies, rheumatoid factor, c-ANCA, p-ANCA & atypical p-ANCA: negative. - Treated initially with IV Rocephin and IV azithromycin. Transitioned to oral antibiotics Ceftin & Azithromycin - Patient will likely need repeat CT chest in about 6-8 weeks-outpatient follow-up with Dr. [name] .  - Benazepril and dulera have been discontinued per pulmonary medicine.  - Patient to follow-up with pulmonary medicine on 04/05/2015 at 2:45 PM for chest x-ray and then at 3:15 for follow-up with Rexene Edison, NP. - Clinically  improved. Evaluated for home oxygen: Does not qualify. Saturated at 89% on room air with activity. - Patient indicates that she has family at home who can provide 24/7 assistance and supervision. She was advised to gradually increase activity as tolerated.      04/04/15  70 year old patient seen today following a recent hospital discharge.  She was admitted and treated for interstitial pneumonia.  She is scheduled for pulmonary follow-up tomorrow Since her discharge she has done well.   She continues to get stronger and still has some mild residual nonproductive cough.  Cough also has improved.  An ARB has been substituted for an ACE inhibitor.  Her appetite is still a bit subpar  Wt Readings from Last 3 Encounters:  04/04/15 200 lb (90.719 kg)  03/18/15 208 lb (94.348 kg)  01/16/15 206 lb (93.441 kg)    Past Medical History  Diagnosis Date  . GERD (gastroesophageal reflux disease)   . Hyperlipidemia   . Low back pain   . Asthma   . Colon polyps   . Hemorrhoids   . Complication of anesthesia     states requires a lot med. to put her to sleep   . Family history of anesthesia complication   . CHF (congestive heart failure) ([place])     pt. unsure- but thinks she was hosp. for CHF- 2002  . Chronic kidney disease     [facility]  . Anxiety   . Depression     "sometimes "  . Shortness of breath   . Sleep apnea     uses c-pap- q night recently  . DDD (degenerative disc disease) 09/17/2011  . Glaucoma     bilateral, pt. admits that she is noncompliant to eye gtts.   Marland Kitchen  Hypertension     had stress, echo- 2006 /w [place], Cardiac Cath, per pt. 2002, echo repeated 2012- wnl     Social History   Social History  . Marital Status: Married    Spouse Name: N/A  . Number of Children: N/A  . Years of Education: N/A   Occupational History  . retired [employer]arry manager    Social History Main Topics  . Smoking status: Never Smoker   . Smokeless tobacco: Never Used  . Alcohol Use: No  .  Drug Use: No  . Sexual Activity: No   Other Topics Concern  . Not on file   Social History Narrative    Past Surgical History  Procedure Laterality Date  . Cardiac catheterization    . Colonoscopy      remote  . Abdominal hysterectomy      ectopic, fibroids  . Foot surgery      bilat, heel spurs- screw in R foot   . Flexible sigmoidoscopy  02/25/2011    Procedure: FLEXIBLE SIGMOIDOSCOPY;  Surgeon: Inda Castle, MD;  Location: [facility];  Service: Endoscopy;  Laterality: N/A;  . Eye surgery      cataracts removed bilateral- ?IOL  . Sphincterotomy  10/08/2011    Procedure: SPHINCTEROTOMY;  Surgeon: Stark Klein, MD;  Location: [place];  Service: General;  Laterality: N/A;  . Fissurectomy  10/08/2011    Procedure: FISSURECTOMY;  Surgeon: Stark Klein, MD;  Location: [place];  Service: General;  Laterality: N/A;  . Hemorrhoid surgery  10/08/2011    Procedure: HEMORRHOIDECTOMY;  Surgeon: Stark Klein, MD;  Location: [place];  Service: General;  Laterality: N/A;  External     Family History  Problem Relation Age of Onset  . Cancer Mother     breast  . Heart attack Mother   . Heart disease Mother   . Cancer Other     breast, colon, lung  . Heart disease Other   . Anesthesia problems Neg Hx   . Emphysema Sister   . Cancer Sister     breast  . Asthma Sister   . Heart disease Sister   . Rheum arthritis Sister   . Cancer Brother     colon    Allergies  Allergen Reactions  . Fish Oil Anaphylaxis  . Other Hives, Shortness Of Breath and Swelling    Allergic to cashew nuts and peanut oil.  Marland Kitchen Penicillins Anaphylaxis  . Pneumococcal Vaccines Nausea And Vomiting  . Aspirin Itching and Rash  . Bactrim [Sulfamethoxazole-Trimethoprim] Hives, Itching and Rash  . Ciprofloxacin Hives, Itching and Rash  . Ibuprofen Rash    REACTION: rash  . Influenza Vaccines Hives  . Ivp Dye [Iodinated Diagnostic Agents] Hives, Itching and Rash    Gives benadryl to counteract symptoms  . Latex  Rash  . Macrobid [Nitrofurantoin Monohydrate Macrocrystals] Hives  . Shellfish Allergy Hives    Patient also allergic to seafood    Current Outpatient Prescriptions on File Prior to Visit  Medication Sig Dispense Refill  . albuterol (PROVENTIL HFA;VENTOLIN HFA) 108 (90 BASE) MCG/ACT inhaler Inhale 2 puffs into the lungs every 6 (six) hours as needed. 1 Inhaler 5  . ALPRAZolam (XANAX) 0.25 MG tablet Take 1 tablet (0.25 mg total) by mouth 3 (three) times daily as needed for anxiety or sleep.    Marland Kitchen amLODipine (NORVASC) 5 MG tablet Take 1 tablet (5 mg total) by mouth daily. 90 tablet 1  . benzonatate (TESSALON) 200 MG capsule Take 1 capsule (  200 mg total) by mouth 2 (two) times daily as needed for cough. 20 capsule 0  . cyclobenzaprine (FLEXERIL) 10 MG tablet Take 1 tablet (10 mg total) by mouth 3 (three) times daily as needed. 90 tablet 1  . escitalopram (LEXAPRO) 5 MG tablet Take 1 tablet (5 mg total) by mouth daily. 60 tablet 3  . feeding supplement, ENSURE ENLIVE, (ENSURE ENLIVE) LIQD Take 237 mLs by mouth 2 (two) times daily between meals.    . Fluticasone-Salmeterol (ADVAIR) 250-50 MCG/DOSE AEPB Inhale 1 puff into the lungs every 12 (twelve) hours. 60 each 6  . furosemide (LASIX) 40 MG tablet Take 1 tablet (40 mg total) by mouth daily. 90 tablet 1  . lactulose (CHRONULAC) 10 GM/15ML solution Take 30 mLs (20 g total) by mouth 3 (three) times daily as needed for mild constipation. 473 mL 1  . Linaclotide (LINZESS) 290 MCG CAPS capsule Take 1 capsule (290 mcg total) by mouth daily. 30 capsule 0  . losartan (COZAAR) 50 MG tablet Take 1 tablet (50 mg total) by mouth daily. 30 tablet 0  . lovastatin (MEVACOR) 40 MG tablet Take 2 tablets (80 mg total) by mouth at bedtime. 180 tablet 1  . meloxicam (MOBIC) 15 MG tablet Take 1 tablet (15 mg total) by mouth daily. 60 tablet 3  . metoprolol (LOPRESSOR) 50 MG tablet Take 1 tablet (50 mg total) by mouth 2 (two) times daily. 180 tablet 1  . omeprazole  (PRILOSEC) 20 MG capsule TAKE TWO CAPSULES BY MOUTH ONCE DAILY 60 capsule 5  . potassium chloride SA (K-DUR,KLOR-CON) 20 MEQ tablet Take 1 tablet (20 mEq total) by mouth daily. 90 tablet 3  . travoprost, benzalkonium, (TRAVATAN) 0.004 % ophthalmic solution Place 1 drop into both eyes at bedtime.     No current facility-administered medications on file prior to visit.    BP 150/80 mmHg  Pulse 54  Temp(Src) 98.3 F (36.8 C) (Oral)  Resp 20  Ht 5\' 5"  (1.651 m)  Wt 200 lb (90.719 kg)  BMI 33.28 kg/m2  SpO2 98%      Review of Systems  Constitutional: Positive for activity change, appetite change and fatigue.  HENT: Negative for congestion, dental problem, hearing loss, rhinorrhea, sinus pressure, sore throat and tinnitus.   Eyes: Negative for pain, discharge and visual disturbance.  Respiratory: Positive for cough. Negative for shortness of breath.   Cardiovascular: Negative for chest pain, palpitations and leg swelling.  Gastrointestinal: Negative for nausea, vomiting, abdominal pain, diarrhea, constipation, blood in stool and abdominal distention.  Genitourinary: Negative for dysuria, urgency, frequency, hematuria, flank pain, vaginal bleeding, vaginal discharge, difficulty urinating, vaginal pain and pelvic pain.  Musculoskeletal: Negative for joint swelling, arthralgias and gait problem.  Skin: Negative for rash.  Neurological: Positive for weakness. Negative for dizziness, syncope, speech difficulty, numbness and headaches.  Hematological: Negative for adenopathy.  Psychiatric/Behavioral: Negative for behavioral problems, dysphoric mood and agitation. The patient is not nervous/anxious.        Objective:   Physical Exam  Constitutional: She is oriented to person, place, and time. She appears well-developed and well-nourished.  HENT:  Head: Normocephalic.  [name] Ear: External ear normal.  Left Ear: External ear normal.  Mouth/Throat: Oropharynx is clear and moist.  Eyes:  Conjunctivae and EOM are normal. Pupils are equal, round, and reactive to light.  Neck: Normal range of motion. Neck supple. No thyromegaly present.  Cardiovascular: Normal rate, regular rhythm, normal heart sounds and intact distal pulses.   Pulse 60  Pulmonary/Chest: Effort normal and breath sounds normal. No respiratory distress.  O2 saturation 98 Chest is remarkably clear.  Perhaps a few crackles at the [name] base only  Abdominal: Soft. Bowel sounds are normal. She exhibits no mass. There is no tenderness.  Musculoskeletal: Normal range of motion.  Lymphadenopathy:    She has no cervical adenopathy.  Neurological: She is alert and oriented to person, place, and time.  Skin: Skin is warm and dry. No rash noted.  Psychiatric: She has a normal mood and affect. Her behavior is normal.          Assessment & Plan:    Interstitial pneumonitis.  Patient appears to be improving clinically.  Patient does have pulmonary follow-up tomorrow.  Follow-up chest CT is planned Essential hypertension.  10.  You present regimen of ARB and CCB Dyslipidemia.  Continue statin therapy  Recheck 6 months or as needed

## 2015-04-04 NOTE — Progress Notes (Signed)
Pre visit review using our clinic review tool, if applicable. No additional management support is needed unless otherwise documented below in the visit note. 

## 2015-04-05 ENCOUNTER — Other Ambulatory Visit: Payer: Self-pay | Admitting: *Deleted

## 2015-04-05 ENCOUNTER — Ambulatory Visit (INDEPENDENT_AMBULATORY_CARE_PROVIDER_SITE_OTHER)
Admission: RE | Admit: 2015-04-05 | Discharge: 2015-04-05 | Disposition: A | Discharge status: Home / Self Care | Payer: Medicare Other | Source: Ambulatory Visit | Attending: Adult Health | Admitting: Adult Health

## 2015-04-05 ENCOUNTER — Encounter: Payer: Self-pay | Admitting: Adult Health

## 2015-04-05 ENCOUNTER — Ambulatory Visit (INDEPENDENT_AMBULATORY_CARE_PROVIDER_SITE_OTHER): Payer: Medicare Other | Admitting: Adult Health

## 2015-04-05 VITALS — BP 120/62 | HR 55 | Temp 98.2°F | Ht 66.0 in | Wt 201.0 lb

## 2015-04-05 DIAGNOSIS — R9389 Abnormal findings on diagnostic imaging of other specified body structures: Secondary | ICD-10-CM

## 2015-04-05 DIAGNOSIS — J4520 Mild intermittent asthma, uncomplicated: Secondary | ICD-10-CM | POA: Diagnosis not present

## 2015-04-05 DIAGNOSIS — J45909 Unspecified asthma, uncomplicated: Secondary | ICD-10-CM | POA: Diagnosis not present

## 2015-04-05 DIAGNOSIS — J189 Pneumonia, unspecified organism: Secondary | ICD-10-CM

## 2015-04-05 DIAGNOSIS — R938 Abnormal findings on diagnostic imaging of other specified body structures: Secondary | ICD-10-CM | POA: Diagnosis not present

## 2015-04-05 DIAGNOSIS — R918 Other nonspecific abnormal finding of lung field: Secondary | ICD-10-CM | POA: Diagnosis not present

## 2015-04-05 DIAGNOSIS — I10 Essential (primary) hypertension: Secondary | ICD-10-CM | POA: Diagnosis not present

## 2015-04-05 DIAGNOSIS — M549 Dorsalgia, unspecified: Secondary | ICD-10-CM | POA: Diagnosis not present

## 2015-04-05 DIAGNOSIS — J84112 Idiopathic pulmonary fibrosis: Secondary | ICD-10-CM | POA: Insufficient documentation

## 2015-04-05 MED ORDER — AMLODIPINE BESYLATE 5 MG PO TABS: 5.0000 mg | ORAL_TABLET | Freq: Every day | ORAL | Status: DC

## 2015-04-05 MED ORDER — ALBUTEROL SULFATE HFA 108 (90 BASE) MCG/ACT IN AERS: 2.0000 | INHALATION_SPRAY | Freq: Four times a day (QID) | RESPIRATORY_TRACT | Status: DC | PRN

## 2015-04-05 MED ORDER — OMEPRAZOLE 20 MG PO CPDR: DELAYED_RELEASE_CAPSULE | ORAL | Status: DC

## 2015-04-05 MED ORDER — METOPROLOL TARTRATE 50 MG PO TABS: 50.0000 mg | ORAL_TABLET | Freq: Two times a day (BID) | ORAL | Status: DC

## 2015-04-05 MED ORDER — LOVASTATIN 40 MG PO TABS: 80.0000 mg | ORAL_TABLET | Freq: Every day | ORAL | Status: DC

## 2015-04-05 MED ORDER — POTASSIUM CHLORIDE CRYS ER 20 MEQ PO TBCR: 20.0000 meq | EXTENDED_RELEASE_TABLET | Freq: Every day | ORAL | Status: DC

## 2015-04-05 MED ORDER — LOSARTAN POTASSIUM 50 MG PO TABS: 50.0000 mg | ORAL_TABLET | Freq: Every day | ORAL | Status: DC

## 2015-04-05 MED ORDER — FUROSEMIDE 40 MG PO TABS: 40.0000 mg | ORAL_TABLET | Freq: Every day | ORAL | Status: DC

## 2015-04-05 MED ORDER — LINACLOTIDE 290 MCG PO CAPS: 290.0000 ug | ORAL_CAPSULE | Freq: Every day | ORAL | Status: DC

## 2015-04-05 MED ORDER — MELOXICAM 15 MG PO TABS: 15.0000 mg | ORAL_TABLET | Freq: Every day | ORAL | Status: DC

## 2015-04-05 NOTE — Assessment & Plan Note (Signed)
Bilateral GGO much improved after abx course  CXR w/ near complete resolution.  Follow up cxr on return 6 weeks.

## 2015-04-05 NOTE — Progress Notes (Signed)
Subjective:    Patient ID: Miranda [name], female    DOB: [birth year], 70 y.o.   MRN: [id number]  HPI 70 yo female never smoker seen for pulmonary consult for atypical PNA  w/ ? ILD    04/05/2015 Post Hospital follow up  Pt returns for a post hospital follow up .  Patient was recently admitted with a suspected community-acquired pneumonia. Chest x-ray showed bilateral pulmonary filtrate worse on the [name] upper lobe. CT chest showed mediastinal adenopathy. Patchy areas of ground glass attenuation throughout both lungs. Increased areas along the [name] upper lobe. Concern for possible underlying ILD with early usual interstitial pneumonia.  He was treated with IV antibiotics. ANA , ANCA , RA Factor neg ,  Since discharge she is feeling improved. She still has a low energy level. She has occasional dry cough and shortness of breath with activity. CXR today shows near complete bilateral GGO . Stable ILD changes w/ adenopathy.   He does have a history of sleep apnea. Sleep on C Pap. She says he has not used this in greater than 6 months. She says she is not ready to restart this at this time .   Past Medical History  Diagnosis Date  . GERD (gastroesophageal reflux disease)   . Hyperlipidemia   . Low back pain   . Asthma   . Colon polyps   . Hemorrhoids   . Complication of anesthesia     states requires a lot med. to put her to sleep   . Family history of anesthesia complication   . CHF (congestive heart failure) ([place])     pt. unsure- but thinks she was hosp. for CHF- 2002  . Chronic kidney disease     [facility]  . Anxiety   . Depression     "sometimes "  . Shortness of breath   . Sleep apnea     uses c-pap- q night recently  . DDD (degenerative disc disease) 09/17/2011  . Glaucoma     bilateral, pt. admits that she is noncompliant to eye gtts.   . Hypertension     had stress, echo- 2006 /w [place], Cardiac Cath, per pt. 2002, echo repeated 2012- wnl    Current  Outpatient Prescriptions on File Prior to Visit  Medication Sig Dispense Refill  . ALPRAZolam (XANAX) 0.25 MG tablet Take 1 tablet (0.25 mg total) by mouth 3 (three) times daily as needed for anxiety or sleep.    . benzonatate (TESSALON) 200 MG capsule Take 1 capsule (200 mg total) by mouth 2 (two) times daily as needed for cough. 20 capsule 0  . cyclobenzaprine (FLEXERIL) 10 MG tablet Take 1 tablet (10 mg total) by mouth 3 (three) times daily as needed. 90 tablet 1  . escitalopram (LEXAPRO) 5 MG tablet Take 1 tablet (5 mg total) by mouth daily. 60 tablet 3  . Fluticasone-Salmeterol (ADVAIR) 250-50 MCG/DOSE AEPB Inhale 1 puff into the lungs every 12 (twelve) hours. 60 each 6  . HYDROcodone-acetaminophen (NORCO) 10-325 MG tablet Take 1 tablet by mouth every 6 (six) hours as needed. 90 tablet 0  . lactulose (CHRONULAC) 10 GM/15ML solution Take 30 mLs (20 g total) by mouth 3 (three) times daily as needed for mild constipation. 473 mL 1  . travoprost, benzalkonium, (TRAVATAN) 0.004 % ophthalmic solution Place 1 drop into both eyes at bedtime.    . feeding supplement, ENSURE ENLIVE, (ENSURE ENLIVE) LIQD Take 237 mLs by mouth 2 (two) times daily between  meals. (Patient not taking: Reported on 04/05/2015)     No current facility-administered medications on file prior to visit.     Review of Systems    Constitutional:   No  weight loss, night sweats,  Fevers, chills,  +tigue, or  lassitude.  HEENT:   No headaches,  Difficulty swallowing,  Tooth/dental problems, or  Sore throat,                No sneezing, itching, ear ache, nasal congestion, post nasal drip,   CV:  No chest pain,  Orthopnea, PND, swelling in lower extremities, anasarca, dizziness, palpitations, syncope.   GI  No heartburn, indigestion, abdominal pain, nausea, vomiting, diarrhea, change in bowel habits, loss of appetite, bloody stools.   Resp:  .  No chest wall deformity  Skin: no rash or lesions.  GU: no dysuria, change in  color of urine, no urgency or frequency.  No flank pain, no hematuria   MS:  +chronic joint pain   Psych:  No change in mood or affect. No depression or anxiety.  No memory loss.      Objective:   Physical Exam Filed Vitals:   04/05/15 1507  BP: 120/62  Pulse: 55  Temp: 98.2 F (36.8 C)  TempSrc: Oral  Height: 5\' 6"  (1.676 m)  Weight: 201 lb (91.173 kg)  SpO2: 98%   GEN: A/Ox3; pleasant , NAD, overweight   HEENT:  [place]/AT,  EACs-clear, TMs-wnl, NOSE-clear, THROAT-clear, no lesions, no postnasal drip or exudate noted.   NECK:  Supple w/ fair ROM; no JVD; normal carotid impulses w/o bruits; no thyromegaly or nodules palpated; no lymphadenopathy.  RESP  Clear  P & A; w/o, wheezes/ rales/ or rhonchi.no accessory muscle use, no dullness to percussion  CARD:  RRR, no m/r/g  , no peripheral edema, pulses intact, no cyanosis or clubbing.  GI:   Soft & nt; nml bowel sounds; no organomegaly or masses detected.  Musco: Warm bil, arthritic changes in hands.   Neuro: alert, no focal deficits noted.    Skin: Warm, no lesions or rashes   CXR  04/05/15 reviewed independently  Interval near-complete resolution of the bilateral pulmonary ground-glass opacities consistent with resolution of an acute inflammatory process. Grossly stable underlying interstitial lung disease with associated mediastinal and hilar lymphadenopathy. Please refer to recent CT findings and follow up recommendations, including recommendation for follow-up CT in 6-12 months.       Assessment & Plan:

## 2015-04-05 NOTE — Assessment & Plan Note (Signed)
?   ILD changes on CT chest w/ adenopathy  Check PFT on return  Consider repeat CT chest in 3-6 months and As needed

## 2015-04-05 NOTE — Patient Instructions (Addendum)
Continue on current regimen  Follow up with Dr. [name] in 6 weeks with chest xray and As needed   Please contact office for sooner follow up if symptoms do not improve or worsen or seek emergency care  PFT on return

## 2015-04-05 NOTE — Telephone Encounter (Signed)
Pt called needed refills on all medications. Rx's sent to pharmacy.

## 2015-04-06 DIAGNOSIS — M549 Dorsalgia, unspecified: Secondary | ICD-10-CM | POA: Diagnosis not present

## 2015-04-06 DIAGNOSIS — I10 Essential (primary) hypertension: Secondary | ICD-10-CM | POA: Diagnosis not present

## 2015-04-06 DIAGNOSIS — J45909 Unspecified asthma, uncomplicated: Secondary | ICD-10-CM | POA: Diagnosis not present

## 2015-04-06 DIAGNOSIS — D649 Anemia, unspecified: Secondary | ICD-10-CM | POA: Diagnosis not present

## 2015-04-06 DIAGNOSIS — J189 Pneumonia, unspecified organism: Secondary | ICD-10-CM | POA: Diagnosis not present

## 2015-04-06 DIAGNOSIS — K219 Gastro-esophageal reflux disease without esophagitis: Secondary | ICD-10-CM | POA: Diagnosis not present

## 2015-04-06 DIAGNOSIS — E785 Hyperlipidemia, unspecified: Secondary | ICD-10-CM | POA: Diagnosis not present

## 2015-04-09 DIAGNOSIS — K219 Gastro-esophageal reflux disease without esophagitis: Secondary | ICD-10-CM | POA: Diagnosis not present

## 2015-04-09 DIAGNOSIS — E785 Hyperlipidemia, unspecified: Secondary | ICD-10-CM | POA: Diagnosis not present

## 2015-04-09 DIAGNOSIS — J189 Pneumonia, unspecified organism: Secondary | ICD-10-CM | POA: Diagnosis not present

## 2015-04-09 DIAGNOSIS — J45909 Unspecified asthma, uncomplicated: Secondary | ICD-10-CM | POA: Diagnosis not present

## 2015-04-09 DIAGNOSIS — D649 Anemia, unspecified: Secondary | ICD-10-CM | POA: Diagnosis not present

## 2015-04-09 DIAGNOSIS — M549 Dorsalgia, unspecified: Secondary | ICD-10-CM | POA: Diagnosis not present

## 2015-04-09 DIAGNOSIS — I10 Essential (primary) hypertension: Secondary | ICD-10-CM | POA: Diagnosis not present

## 2015-04-15 NOTE — Progress Notes (Signed)
Reviwed, I agree with this plan of care Will f/u repeat PFT on return, agree likely needs repeat CT chest

## 2015-04-16 ENCOUNTER — Telehealth: Payer: Self-pay | Admitting: Internal Medicine

## 2015-04-16 ENCOUNTER — Emergency Department (HOSPITAL_COMMUNITY)
Admission: EM | Admit: 2015-04-16 | Discharge: 2015-04-16 | Disposition: A | Discharge status: Home / Self Care | Payer: Medicare Other | Attending: Emergency Medicine | Admitting: Emergency Medicine

## 2015-04-16 ENCOUNTER — Encounter (HOSPITAL_COMMUNITY): Payer: Self-pay

## 2015-04-16 DIAGNOSIS — R197 Diarrhea, unspecified: Secondary | ICD-10-CM | POA: Insufficient documentation

## 2015-04-16 DIAGNOSIS — Z9981 Dependence on supplemental oxygen: Secondary | ICD-10-CM | POA: Diagnosis not present

## 2015-04-16 DIAGNOSIS — Y9389 Activity, other specified: Secondary | ICD-10-CM | POA: Diagnosis not present

## 2015-04-16 DIAGNOSIS — I129 Hypertensive chronic kidney disease with stage 1 through stage 4 chronic kidney disease, or unspecified chronic kidney disease: Secondary | ICD-10-CM | POA: Diagnosis not present

## 2015-04-16 DIAGNOSIS — Z9889 Other specified postprocedural states: Secondary | ICD-10-CM | POA: Diagnosis not present

## 2015-04-16 DIAGNOSIS — Z7951 Long term (current) use of inhaled steroids: Secondary | ICD-10-CM | POA: Diagnosis not present

## 2015-04-16 DIAGNOSIS — N189 Chronic kidney disease, unspecified: Secondary | ICD-10-CM | POA: Insufficient documentation

## 2015-04-16 DIAGNOSIS — K219 Gastro-esophageal reflux disease without esophagitis: Secondary | ICD-10-CM | POA: Diagnosis not present

## 2015-04-16 DIAGNOSIS — F419 Anxiety disorder, unspecified: Secondary | ICD-10-CM | POA: Insufficient documentation

## 2015-04-16 DIAGNOSIS — F329 Major depressive disorder, single episode, unspecified: Secondary | ICD-10-CM | POA: Insufficient documentation

## 2015-04-16 DIAGNOSIS — Z86010 Personal history of colonic polyps: Secondary | ICD-10-CM | POA: Diagnosis not present

## 2015-04-16 DIAGNOSIS — T383X1A Poisoning by insulin and oral hypoglycemic [antidiabetic] drugs, accidental (unintentional), initial encounter: Secondary | ICD-10-CM | POA: Diagnosis not present

## 2015-04-16 DIAGNOSIS — R110 Nausea: Secondary | ICD-10-CM | POA: Insufficient documentation

## 2015-04-16 DIAGNOSIS — Z91040 Latex allergy status: Secondary | ICD-10-CM | POA: Diagnosis not present

## 2015-04-16 DIAGNOSIS — G4730 Sleep apnea, unspecified: Secondary | ICD-10-CM | POA: Diagnosis not present

## 2015-04-16 DIAGNOSIS — E785 Hyperlipidemia, unspecified: Secondary | ICD-10-CM | POA: Insufficient documentation

## 2015-04-16 DIAGNOSIS — Y9289 Other specified places as the place of occurrence of the external cause: Secondary | ICD-10-CM | POA: Insufficient documentation

## 2015-04-16 DIAGNOSIS — X58XXXA Exposure to other specified factors, initial encounter: Secondary | ICD-10-CM | POA: Diagnosis not present

## 2015-04-16 DIAGNOSIS — R51 Headache: Secondary | ICD-10-CM | POA: Diagnosis not present

## 2015-04-16 DIAGNOSIS — Z791 Long term (current) use of non-steroidal anti-inflammatories (NSAID): Secondary | ICD-10-CM | POA: Insufficient documentation

## 2015-04-16 DIAGNOSIS — I509 Heart failure, unspecified: Secondary | ICD-10-CM | POA: Insufficient documentation

## 2015-04-16 DIAGNOSIS — R630 Anorexia: Secondary | ICD-10-CM | POA: Insufficient documentation

## 2015-04-16 DIAGNOSIS — Y998 Other external cause status: Secondary | ICD-10-CM | POA: Diagnosis not present

## 2015-04-16 DIAGNOSIS — J45909 Unspecified asthma, uncomplicated: Secondary | ICD-10-CM | POA: Insufficient documentation

## 2015-04-16 DIAGNOSIS — Z880 Allergy status to penicillin: Secondary | ICD-10-CM | POA: Diagnosis not present

## 2015-04-16 DIAGNOSIS — Z79899 Other long term (current) drug therapy: Secondary | ICD-10-CM | POA: Diagnosis not present

## 2015-04-16 DIAGNOSIS — T50901A Poisoning by unspecified drugs, medicaments and biological substances, accidental (unintentional), initial encounter: Secondary | ICD-10-CM

## 2015-04-16 LAB — CBC WITH DIFFERENTIAL/PLATELET
BASOS PCT: 0 %
Basophils Absolute: 0 10*3/uL (ref 0.0–0.1)
EOS ABS: 1.1 10*3/uL — AB (ref 0.0–0.7)
EOS PCT: 13 %
HCT: 40.5 % (ref 36.0–46.0)
Hemoglobin: 13.4 g/dL (ref 12.0–15.0)
Lymphocytes Relative: 29 %
Lymphs Abs: 2.4 10*3/uL (ref 0.7–4.0)
MCH: 27.8 pg (ref 26.0–34.0)
MCHC: 33.1 g/dL (ref 30.0–36.0)
MCV: 84 fL (ref 78.0–100.0)
MONO ABS: 0.7 10*3/uL (ref 0.1–1.0)
MONOS PCT: 8 %
Neutro Abs: 4.4 10*3/uL (ref 1.7–7.7)
Neutrophils Relative %: 50 %
Platelets: 144 10*3/uL — ABNORMAL LOW (ref 150–400)
RBC: 4.82 MIL/uL (ref 3.87–5.11)
RDW: 14.7 % (ref 11.5–15.5)
WBC: 8.6 10*3/uL (ref 4.0–10.5)

## 2015-04-16 LAB — URINALYSIS, ROUTINE W REFLEX MICROSCOPIC
BILIRUBIN URINE: NEGATIVE
GLUCOSE, UA: NEGATIVE mg/dL
KETONES UR: NEGATIVE mg/dL
Nitrite: NEGATIVE
PH: 6 (ref 5.0–8.0)
PROTEIN: NEGATIVE mg/dL
Specific Gravity, Urine: 1.011 (ref 1.005–1.030)

## 2015-04-16 LAB — COMPREHENSIVE METABOLIC PANEL
ALBUMIN: 4.3 g/dL (ref 3.5–5.0)
ALT: 30 U/L (ref 14–54)
ANION GAP: 8 (ref 5–15)
AST: 35 U/L (ref 15–41)
Alkaline Phosphatase: 43 U/L (ref 38–126)
BILIRUBIN TOTAL: 0.9 mg/dL (ref 0.3–1.2)
BUN: 23 mg/dL — ABNORMAL HIGH (ref 6–20)
CALCIUM: 9.6 mg/dL (ref 8.9–10.3)
CO2: 26 mmol/L (ref 22–32)
CREATININE: 0.8 mg/dL (ref 0.44–1.00)
Chloride: 106 mmol/L (ref 101–111)
GFR calc Af Amer: >60 mL/min (ref >60)
GFR calc non Af Amer: >60 mL/min (ref >60)
GLUCOSE: 83 mg/dL (ref 65–99)
POTASSIUM: 4.5 mmol/L (ref 3.5–5.1)
SODIUM: 140 mmol/L (ref 135–145)
TOTAL PROTEIN: 8.4 g/dL — AB (ref 6.5–8.1)

## 2015-04-16 LAB — URINE MICROSCOPIC-ADD ON

## 2015-04-16 LAB — SALICYLATE LEVEL: Salicylate Lvl: <4 mg/dL (ref 2.8–30.0)

## 2015-04-16 LAB — I-STAT CG4 LACTIC ACID, ED: Lactic Acid, Venous: 1.67 mmol/L (ref 0.5–2.0)

## 2015-04-16 LAB — ETHANOL

## 2015-04-16 LAB — CBG MONITORING, ED: Glucose-Capillary: 80 mg/dL (ref 65–99)

## 2015-04-16 LAB — ACETAMINOPHEN LEVEL: Acetaminophen (Tylenol), Serum: <10 ug/mL — ABNORMAL LOW (ref 10–30)

## 2015-04-16 LAB — TROPONIN I: Troponin I: <0.03 ng/mL (ref <0.031)

## 2015-04-16 MED ORDER — SODIUM CHLORIDE 0.9 % IV BOLUS (SEPSIS)
1000.0000 mL | Freq: Once | INTRAVENOUS | Status: AC
  Administered 2015-04-16: 1000 mL via INTRAVENOUS

## 2015-04-16 NOTE — ED Notes (Signed)
Patient reports that she checked her BS this AM at [phone] and called her friend who instructerd her to take her husband's Metformin. Patient thought she was taking Metformin 50 mg. Checked the BS a 2 hours later-175. Friend told the patient to take 2 more Metformin tabs. After taking the meds, the patient realized that the dosage was 1000 mg per tab. Patient called PCP and was instructed to come to the ED. The patient has not eaten all day. CBG- in triage 80.

## 2015-04-16 NOTE — Telephone Encounter (Signed)
Patient Name: Miranda [name]  DOB: 21-Dec-1945    Initial Comment Caller states woke up feeling dizzy, drowsy and sleepy , checked BS and it was 185, was told to take husbands metformin mg , 2-3 hrs it 175, was told to take 2 more metformin 50 mg and BS is 205    Nurse Assessment  Nurse: Thad Ranger, RN, Denise Date/Time (Eastern Time): 04/16/2015 1:44:54 PM  Confirm and document reason for call. If symptomatic, describe symptoms. You must click the next button to save text entered. ---Caller states woke up feeling dizzy, drowsy and sleepy. She checked BG and it was 185 and was told to take husbands Metformin 50mg  at 0830. Checked BG 2-3 hrs later and it was 175. She was told to take 2 more Metformin 50 mg at 1030, she ate a hamburger and now the BG is 205. Caller states she is NOT a diabetic and was told by a friend to take her spouse medication. States she has a slight headache.  Has the patient traveled out of the country within the last 30 days? ---Not Applicable  Does the patient have any new or worsening symptoms? ---Yes  Will a triage be completed? ---Yes  Related visit to physician within the last 2 weeks? ---Yes  Does the PT have any chronic conditions? (i.e. diabetes, asthma, etc.) ---Yes  List chronic conditions. ---HTN, COPD  Is this a behavioral health or substance abuse call? ---No     Guidelines    Guideline Title Affirmed Question Affirmed Notes  Diabetes - High Blood Sugar [1] New onset Diabetes suspected (e.g., frequent urination, weak, weight loss) AND [2] vomiting or rapid breathing    Final Disposition User   Go to ED Now (or PCP triage) Thad Ranger, RN, Denise    Comments  Advised DO NOT take another person's meds at any time. Advised attempting to treat herself for diabetes is very dangerous. Advised the medication can drop her BG to dangerously low levels and cause coma or death. Advised to take the bottle of Metformin with her other medications to the ER with her. Verb  understanding.   Referrals  [facility] - ED  [facility] - ED   Disagree/Comply: Comply

## 2015-04-16 NOTE — ED Notes (Signed)
Ambulated without difficulty.

## 2015-04-16 NOTE — ED Provider Notes (Signed)
CSN: [id number]     Arrival date & time 04/16/15  1429 History   None    Chief Complaint  Patient presents with  . Medication Reaction     (Consider location/radiation/quality/duration/timing/severity/associated sxs/prior Treatment) HPI Comments: Patient states she woke up this morning with a headache and felt fatigued. She decided to check her blood sugar which was 185. She is not a diabetic. She called a friend who told her to take her husband's metformin. She took 2 tablets of metformin that she thought were 50 mg were actually 1000 mg. She then took a third thousand metformin around 10:30 AM. She felt sleepy and then took a nap. She woke up and ate a hamburger and had several episodes of diarrhea. No Vomiting. Denies any fever. Denies any chest pain or shortness of breath. States she was not trying to hurt herself. She denies any suicidal or homicidal thoughts. She called her PCP this afternoon and was told to come to the ED. She endorses a mild headache now. Blood sugar is 80 on arrival.  The history is provided by the patient.    Past Medical History  Diagnosis Date  . GERD (gastroesophageal reflux disease)   . Hyperlipidemia   . Low back pain   . Asthma   . Colon polyps   . Hemorrhoids   . Complication of anesthesia     states requires a lot med. to put her to sleep   . Family history of anesthesia complication   . CHF (congestive heart failure) ([place])     pt. unsure- but thinks she was hosp. for CHF- 2002  . [facility]     [facility]  . Anxiety   . Depression     "sometimes "  . Shortness of breath   . Sleep apnea     uses c-pap- q night recently  . DDD (degenerative disc disease) 09/17/2011  . Glaucoma     bilateral, pt. admits that she is noncompliant to eye gtts.   . Hypertension     had stress, echo- 2006 /w [place], Cardiac Cath, per pt. 2002, echo repeated 2012- wnl    Past Surgical History  Procedure Laterality Date  . Cardiac  catheterization    . Colonoscopy      remote  . Abdominal hysterectomy      ectopic, fibroids  . Foot surgery      bilat, heel spurs- screw in R foot   . Flexible sigmoidoscopy  02/25/2011    Procedure: FLEXIBLE SIGMOIDOSCOPY;  Surgeon: Inda Castle, MD;  Location: [facility];  Service: Endoscopy;  Laterality: N/A;  . Eye surgery      cataracts removed bilateral- ?IOL  . Sphincterotomy  10/08/2011    Procedure: SPHINCTEROTOMY;  Surgeon: Stark Klein, MD;  Location: [place];  Service: General;  Laterality: N/A;  . Fissurectomy  10/08/2011    Procedure: FISSURECTOMY;  Surgeon: Stark Klein, MD;  Location: [place];  Service: General;  Laterality: N/A;  . Hemorrhoid surgery  10/08/2011    Procedure: HEMORRHOIDECTOMY;  Surgeon: Stark Klein, MD;  Location: [place];  Service: General;  Laterality: N/A;  External    Family History  Problem Relation Age of Onset  . Cancer Mother     breast  . Heart attack Mother   . Heart disease Mother   . Cancer Other     breast, colon, lung  . Heart disease Other   . Anesthesia problems Neg Hx   . Emphysema  Sister   . Cancer Sister     breast  . Asthma Sister   . Heart disease Sister   . Rheum arthritis Sister   . Cancer Brother     colon   Social History  Substance Use Topics  . Smoking status: Never Smoker   . Smokeless tobacco: Never Used  . Alcohol Use: No   OB History    No data available     Review of Systems  Constitutional: Positive for activity change and appetite change. Negative for fever.  HENT: Negative for congestion.   Eyes: Negative for visual disturbance.  Respiratory: Negative for cough and shortness of breath.   Cardiovascular: Negative for chest pain.  Gastrointestinal: Positive for nausea and diarrhea. Negative for vomiting and abdominal pain.  Genitourinary: Negative for dysuria and hematuria.  Musculoskeletal: Negative for myalgias and arthralgias.  Skin: Negative for wound.  Neurological: Negative for  dizziness, weakness, light-headedness and headaches.  A complete 10 system review of systems was obtained and all systems are negative except as noted in the HPI and PMH.      Allergies  Fish oil; Other; Penicillins; Pneumococcal vaccines; Aspirin; Bactrim; Ciprofloxacin; Ibuprofen; Influenza vaccines; Ivp dye; Latex; Macrobid; and Shellfish allergy  Home Medications   Prior to Admission medications   Medication Sig Start Date End Date Taking? Authorizing Provider  albuterol (PROVENTIL HFA;VENTOLIN HFA) 108 (90 Base) MCG/ACT inhaler Inhale 2 puffs into the lungs every 6 (six) hours as needed. 04/05/15 04/04/16 Yes Marletta Lor, MD  ALPRAZolam Duanne Moron) 0.25 MG tablet Take 1 tablet (0.25 mg total) by mouth 3 (three) times daily as needed for anxiety or sleep. 03/23/15  Yes Modena Jansky, MD  amLODipine (NORVASC) 5 MG tablet Take 1 tablet (5 mg total) by mouth daily. 04/05/15  Yes Marletta Lor, MD  cyclobenzaprine (FLEXERIL) 10 MG tablet Take 1 tablet (10 mg total) by mouth 3 (three) times daily as needed. Patient taking differently: Take 10 mg by mouth 3 (three) times daily as needed for muscle spasms.  03/07/15  Yes Marletta Lor, MD  escitalopram (LEXAPRO) 5 MG tablet Take 1 tablet (5 mg total) by mouth daily. 03/07/15  Yes Marletta Lor, MD  Fluticasone-Salmeterol (ADVAIR) 250-50 MCG/DOSE AEPB Inhale 1 puff into the lungs every 12 (twelve) hours. 09/10/14  Yes Marletta Lor, MD  furosemide (LASIX) 40 MG tablet Take 1 tablet (40 mg total) by mouth daily. 04/05/15  Yes Marletta Lor, MD  HYDROcodone-acetaminophen [facility]) 10-325 MG tablet Take 1 tablet by mouth every 6 (six) hours as needed. Patient taking differently: Take 1 tablet by mouth every 6 (six) hours as needed for moderate pain.  04/04/15  Yes Marletta Lor, MD  lactulose (CHRONULAC) 10 GM/15ML solution Take 30 mLs (20 g total) by mouth 3 (three) times daily as needed for mild constipation.  03/08/15  Yes Marletta Lor, MD  Linaclotide [facility]) 290 MCG CAPS capsule Take 1 capsule (290 mcg total) by mouth daily. 04/05/15  Yes Marletta Lor, MD  losartan (COZAAR) 50 MG tablet Take 1 tablet (50 mg total) by mouth daily. 04/05/15  Yes Marletta Lor, MD  lovastatin (MEVACOR) 40 MG tablet Take 2 tablets (80 mg total) by mouth at bedtime. 04/05/15  Yes Marletta Lor, MD  meloxicam (MOBIC) 15 MG tablet Take 1 tablet (15 mg total) by mouth daily. 04/05/15  Yes Marletta Lor, MD  metoprolol (LOPRESSOR) 50 MG tablet Take 1 tablet (50 mg total)  by mouth 2 (two) times daily. 04/05/15  Yes Marletta Lor, MD  omeprazole (PRILOSEC) 20 MG capsule TAKE TWO CAPSULES BY MOUTH ONCE DAILY 04/05/15  Yes Marletta Lor, MD  benzonatate (TESSALON) 200 MG capsule Take 1 capsule (200 mg total) by mouth 2 (two) times daily as needed for cough. 03/23/15   Modena Jansky, MD  feeding supplement, ENSURE ENLIVE, (ENSURE ENLIVE) LIQD Take 237 mLs by mouth 2 (two) times daily between meals. Patient not taking: Reported on 04/05/2015 03/23/15   Modena Jansky, MD  potassium chloride SA (K-DUR,KLOR-CON) 20 MEQ tablet Take 1 tablet (20 mEq total) by mouth daily. Patient not taking: Reported on 04/05/2015 04/05/15   Marletta Lor, MD   BP 147/58 mmHg  Pulse 57  Temp(Src) 98.4 F (36.9 C) (Oral)  Resp 21  Ht 5\' 6"  (1.676 m)  Wt 205 lb (92.987 kg)  BMI 33.10 kg/m2  SpO2 98% Physical Exam  Constitutional: She is oriented to person, place, and time. She appears well-developed and well-nourished. No distress.  HENT:  Head: Normocephalic and atraumatic.  Mouth/Throat: Oropharynx is clear and moist. No oropharyngeal exudate.  Eyes: Conjunctivae and EOM are normal. Pupils are equal, round, and reactive to light.  Neck: Normal range of motion. Neck supple.  No meningismus.  Cardiovascular: Normal rate, regular rhythm, normal heart sounds and intact distal pulses.   No murmur  heard. Pulmonary/Chest: Effort normal and breath sounds normal. No respiratory distress.  Abdominal: Soft. There is no tenderness. There is no rebound and no guarding.  Musculoskeletal: Normal range of motion. She exhibits no edema or tenderness.  Neurological: She is alert and oriented to person, place, and time. No cranial nerve deficit. She exhibits normal muscle tone. Coordination normal.  No ataxia on finger to nose bilaterally. No pronator drift. 5/5 strength throughout. CN 2-12 intact.Equal grip strength. Sensation intact.   Skin: Skin is warm.  Psychiatric: She has a normal mood and affect. Her behavior is normal.  Nursing note and vitals reviewed.   ED Course  Procedures (including critical care time) Labs Review Labs Reviewed  CBC WITH DIFFERENTIAL/PLATELET - Abnormal; Notable for the following:    Platelets 144 (*)    Eosinophils Absolute 1.1 (*)    All other components within normal limits  COMPREHENSIVE METABOLIC PANEL - Abnormal; Notable for the following:    BUN 23 (*)    Total Protein 8.4 (*)    All other components within normal limits  URINALYSIS, ROUTINE W REFLEX MICROSCOPIC (NOT AT [facility]) - Abnormal; Notable for the following:    Hgb urine dipstick TRACE (*)    Leukocytes, UA SMALL (*)    All other components within normal limits  ACETAMINOPHEN LEVEL - Abnormal; Notable for the following:    Acetaminophen (Tylenol), Serum <10 (*)    All other components within normal limits  URINE MICROSCOPIC-ADD ON - Abnormal; Notable for the following:    Squamous Epithelial / LPF 0-5 (*)    Bacteria, UA RARE (*)    All other components within normal limits  TROPONIN I  ETHANOL  SALICYLATE LEVEL  CBG MONITORING, ED  I-STAT CG4 LACTIC ACID, ED    Imaging Review No results found. I have personally reviewed and evaluated these images and lab results as part of my medical decision-making.   EKG Interpretation   Date/Time:  Tuesday April 16 2015 16:04:00  EST Ventricular Rate:  48 PR Interval:  150 QRS Duration: 81 QT Interval:  459 QTC Calculation:  410 R Axis:   44 Text Interpretation:  Sinus bradycardia No significant change was found  Confirmed by Wyvonnia Dusky  MD, Jacobus Colvin [phone]) on 04/16/2015 4:11:16 PM      MDM   Final diagnoses:  Accidental drug overdose, initial encounter   Patient with ingestion of metformin last around 10:30 AM total of 3 g. Has several episodes of diarrhea. Denies any suicidal attempt.  Anion gap is normal. Normal lactate. Blood sugar 83. Patient tolerating by mouth without difficulty.  Discussed with poison center. No role for additional monitoring. They feel this is a nontoxic ingestion and should not cause prolonged hypoglycemia. Her lactate and kidney function are normal.  Patient caution to not take other people's medications. She continues to adamantly deny any suicidal thoughts or attempts.  Patient tolerating by mouth and ambulatory. No vomiting or diarrhea in the ED. Cautioned again not to take other people's medications. She continues to deny suicidal attempts or thoughts of systems suicide.  Ezequiel Essex, MD 04/16/15 [phone]

## 2015-04-16 NOTE — Discharge Instructions (Signed)
Accidental Overdose Do not take other people's medication. Follow up with your doctor. Return to the ED if you develop new or worsening symptoms. A drug overdose occurs when a chemical substance (drug or medication) is used in amounts large enough to overcome a person. This may result in severe illness or death. This is a type of poisoning. Accidental overdoses of medications or other substances come from a variety of reasons. When this happens accidentally, it is often because the person taking the substance does not know enough about what they have taken. Drugs which commonly cause overdose deaths are alcohol, psychotropic medications (medications which affect the mind), pain medications, illegal drugs (street drugs) such as cocaine and heroin, and multiple drugs taken at the same time. It may result from careless behavior (such as over-indulging at a party). Other causes of overdose may include multiple drug use, a lapse in memory, or drug use after a period of no drug use.  Sometimes overdosing occurs because a person cannot remember if they have taken their medication.  A common unintentional overdose in young children involves multi-vitamins containing iron. Iron is a part of the hemoglobin molecule in blood. It is used to transport oxygen to living cells. When taken in small amounts, iron allows the body to restock hemoglobin. In large amounts, it causes problems in the body. If this overdose is not treated, it can lead to death. Never take medicines that show signs of tampering or do not seem quite right. Never take medicines in the dark or in poor lighting. Read the label and check each dose of medicine before you take it. When adults are poisoned, it happens most often through carelessness or lack of information. Taking medicines in the dark or taking medicine prescribed for someone else to treat the same type of problem is a dangerous practice. SYMPTOMS  Symptoms of overdose depend on the  medication and amount taken. They can vary from over-activity with stimulant over-dosage, to sleepiness from depressants such as alcohol, narcotics and tranquilizers. Confusion, dizziness, nausea and vomiting may be present. If problems are severe enough coma and death may result. DIAGNOSIS  Diagnosis and management are generally straightforward if the drug is known. Otherwise it is more difficult. At times, certain symptoms and signs exhibited by the patient, or blood tests, can reveal the drug in question.  TREATMENT  In an emergency department, most patients can be treated with supportive measures. Antidotes may be available if there has been an overdose of opioids or benzodiazepines. A rapid improvement will often occur if this is the cause of overdose. At home or away from medical care:  There may be no immediate problems or warning signs in children.  Not everything works well in all cases of poisoning.  Take immediate action. Poisons may act quickly.  If you think someone has swallowed medicine or a household product, and the person is unconscious, having seizures (convulsions), or is not breathing, immediately call for an ambulance. IF a person is conscious and appears to be doing OK but has swallowed a poison:  Do not wait to see what effect the poison will have. Immediately call a poison control center (listed in the white pages of your telephone book under "Poison Control" or inside the front cover with other emergency numbers). Some poison control centers have TTY capability for the deaf. Check with your local center if you or someone in your family requires this service.  Keep the container so you can read the label on  the product for ingredients.  Describe what, when, and how much was taken and the age and condition of the person poisoned. Inform them if the person is vomiting, choking, drowsy, shows a change in color or temperature of skin, is conscious or unconscious, or is  convulsing.  Do not cause vomiting unless instructed by medical personnel. Do not induce vomiting or force liquids into a person who is convulsing, unconscious, or very drowsy. Stay calm and in control.   Activated charcoal also is sometimes used in certain types of poisoning and you may wish to add a supply to your emergency medicines. It is available without a prescription. Call a poison control center before using this medication. PREVENTION  Thousands of children die every year from unintentional poisoning. This may be from household chemicals, poisoning from carbon monoxide in a car, taking their parent's medications, or simply taking a few iron pills or vitamins with iron. Poisoning comes from unexpected sources.  Store medicines out of the sight and reach of children, preferably in a locked cabinet. Do not keep medications in a food cabinet. Always store your medicines in a secure place. Get rid of expired medications.  If you have children living with you or have them as occasional guests, you should have child-resistant caps on your medicine containers. Keep everything out of reach. Child proof your home.  If you are called to the telephone or to answer the door while you are taking a medicine, take the container with you or put the medicine out of the reach of small children.  Do not take your medication in front of children. Do not tell your child how good a medication is and how good it is for them. They may get the idea it is more of a treat.  If you are an adult and have accidentally taken an overdose, you need to consider how this happened and what can be done to prevent it from happening again. If this was from a street drug or alcohol, determine if there is a problem that needs addressing. If you are not sure a problems exists, it is easy to talk to a professional and ask them if they think you have a problem. It is better to handle this problem in this way before it happens again  and has a much worse consequence.   This information is not intended to replace advice given to you by your health care provider. Make sure you discuss any questions you have with your health care provider.   Document Released: 05/16/2004 Document Revised: 03/23/2014 Document Reviewed: 08/20/2014 Elsevier Interactive Patient Education [employer].

## 2015-04-16 NOTE — Telephone Encounter (Signed)
FYI

## 2015-04-22 ENCOUNTER — Other Ambulatory Visit: Payer: Self-pay | Admitting: Internal Medicine

## 2015-04-22 DIAGNOSIS — Z1231 Encounter for screening mammogram for malignant neoplasm of breast: Secondary | ICD-10-CM

## 2015-04-29 DIAGNOSIS — H40013 Open angle with borderline findings, low risk, bilateral: Secondary | ICD-10-CM | POA: Diagnosis not present

## 2015-04-30 ENCOUNTER — Ambulatory Visit: Payer: Medicare Other

## 2015-05-01 ENCOUNTER — Telehealth: Payer: Self-pay | Admitting: Internal Medicine

## 2015-05-10 ENCOUNTER — Ambulatory Visit
Admission: RE | Admit: 2015-05-10 | Discharge: 2015-05-10 | Disposition: A | Discharge status: Home / Self Care | Payer: Medicare Other | Source: Ambulatory Visit | Attending: Internal Medicine | Admitting: Internal Medicine

## 2015-05-10 DIAGNOSIS — Z1231 Encounter for screening mammogram for malignant neoplasm of breast: Secondary | ICD-10-CM | POA: Diagnosis not present

## 2015-05-16 NOTE — Telephone Encounter (Signed)
Opened in error

## 2015-06-03 ENCOUNTER — Telehealth: Payer: Self-pay | Admitting: Internal Medicine

## 2015-06-03 NOTE — Telephone Encounter (Signed)
Pt request refill  HYDROcodone-acetaminophen (NORCO) 10-325 MG tablet  Also needs refill of ALPRAZolam (XANAX) 0.25 MG tablet   [facility] / [place]

## 2015-06-05 MED ORDER — ALPRAZOLAM 0.25 MG PO TABS: 0.2500 mg | ORAL_TABLET | Freq: Three times a day (TID) | ORAL | Status: DC | PRN

## 2015-06-05 MED ORDER — HYDROCODONE-ACETAMINOPHEN 10-325 MG PO TABS: 1.0000 | ORAL_TABLET | Freq: Four times a day (QID) | ORAL | Status: DC | PRN

## 2015-06-05 NOTE — Telephone Encounter (Signed)
Left detailed message on voicemail Rx's ready for pickup, will be at the front desk. Rx's printed and signed.

## 2015-06-07 DIAGNOSIS — M4316 Spondylolisthesis, lumbar region: Secondary | ICD-10-CM | POA: Diagnosis not present

## 2015-06-07 DIAGNOSIS — M5136 Other intervertebral disc degeneration, lumbar region: Secondary | ICD-10-CM | POA: Diagnosis not present

## 2015-06-12 ENCOUNTER — Ambulatory Visit (INDEPENDENT_AMBULATORY_CARE_PROVIDER_SITE_OTHER): Payer: Medicare Other | Admitting: Pulmonary Disease

## 2015-06-12 ENCOUNTER — Encounter: Payer: Self-pay | Admitting: Pulmonary Disease

## 2015-06-12 VITALS — BP 142/78 | HR 59 | Ht 65.0 in | Wt 202.0 lb

## 2015-06-12 DIAGNOSIS — R9389 Abnormal findings on diagnostic imaging of other specified body structures: Secondary | ICD-10-CM

## 2015-06-12 DIAGNOSIS — J4520 Mild intermittent asthma, uncomplicated: Secondary | ICD-10-CM | POA: Diagnosis not present

## 2015-06-12 DIAGNOSIS — M5136 Other intervertebral disc degeneration, lumbar region: Secondary | ICD-10-CM | POA: Diagnosis not present

## 2015-06-12 DIAGNOSIS — M4316 Spondylolisthesis, lumbar region: Secondary | ICD-10-CM | POA: Diagnosis not present

## 2015-06-12 DIAGNOSIS — R938 Abnormal findings on diagnostic imaging of other specified body structures: Secondary | ICD-10-CM | POA: Diagnosis not present

## 2015-06-12 LAB — PULMONARY FUNCTION TEST
DL/VA % PRED: 95 %
DL/VA: 4.68 ml/min/mmHg/L
DLCO cor % pred: 58 %
DLCO cor: 14.95 ml/min/mmHg
DLCO unc % pred: 55 %
DLCO unc: 14.32 ml/min/mmHg
FEF 25-75 PRE: 2.16 L/s
FEF 25-75 Post: 2.43 L/sec
FEF2575-%CHANGE-POST: 12 %
FEF2575-%PRED-POST: 134 %
FEF2575-%Pred-Pre: 119 %
FEV1-%Change-Post: 6 %
FEV1-%PRED-PRE: 78 %
FEV1-%Pred-Post: 84 %
FEV1-PRE: 1.55 L
FEV1-Post: 1.65 L
FEV1FVC-%CHANGE-POST: -1 %
FEV1FVC-%Pred-Pre: 109 %
FEV6-%CHANGE-POST: 8 %
FEV6-%PRED-POST: 80 %
FEV6-%PRED-PRE: 74 %
FEV6-Post: 1.96 L
FEV6-Pre: 1.8 L
FEV6FVC-%Change-Post: 0 %
FEV6FVC-%PRED-PRE: 103 %
FEV6FVC-%Pred-Post: 104 %
FVC-%CHANGE-POST: 8 %
FVC-%PRED-POST: 77 %
FVC-%Pred-Pre: 71 %
FVC-Post: 1.96 L
FVC-Pre: 1.81 L
POST FEV1/FVC RATIO: 84 %
POST FEV6/FVC RATIO: 100 %
PRE FEV1/FVC RATIO: 85 %
PRE FEV6/FVC RATIO: 100 %
RV % PRED: 86 %
RV: 1.9 L
TLC % pred: 74 %
TLC: 3.85 L

## 2015-06-12 NOTE — Progress Notes (Signed)
PFT done today. 

## 2015-06-12 NOTE — Assessment & Plan Note (Signed)
She does not have asthma as there is no evidence of airflow obstruction on her lung function testing today.  Plan: Stop Advair

## 2015-06-12 NOTE — Progress Notes (Signed)
Subjective:    Patient ID: Miranda [name], female    DOB: 06-Nov-1945, 70 y.o.   MRN: [id number]  Synopsis: Former patient of Dr. Gwenette Greet who was hospitalized in January 2017 with Mucor pneumonia and had findings on her CT chest which were worrisome for pulmonary fibrosis. Pulmonary function testing March 2017 showed a ratio of 84%, FVC 1.96 L (77% predicted), total lung capacity 3.85 L (74% predicted), DLCO 14.32 (55% predicted)  HPI Chief Complaint  Patient presents with  . Follow-up    former [facility] pt, saw TP in Jan for [place] of CAP.  pt c/o sob with exertion.  review PFT from today.    She comes back to clinic today to follow-up from her pulmonary function test. She says that she does have some shortness of breath on exertion from time to time. She will cough up white thin sputum from time to time. She takes Advair on an as-needed basis. She denies chest pain or leg swelling. She does not use albuterol. In general, she has improved compared to her hospitalization in January.   Past Medical History  Diagnosis Date  . GERD (gastroesophageal reflux disease)   . Hyperlipidemia   . Low back pain   . Asthma   . Colon polyps   . Hemorrhoids   . Complication of anesthesia     states requires a lot med. to put her to sleep   . Family history of anesthesia complication   . CHF (congestive heart failure) ([place])     pt. unsure- but thinks she was hosp. for CHF- 2002  . [facility] disease     [facility]  . Anxiety   . Depression     "sometimes "  . Shortness of breath   . Sleep apnea     uses c-pap- q night recently  . DDD (degenerative disc disease) 09/17/2011  . Glaucoma     bilateral, pt. admits that she is noncompliant to eye gtts.   . Hypertension     had stress, echo- 2006 /w [place], Cardiac Cath, per pt. 2002, echo repeated 2012- wnl       Review of Systems     Objective:   Physical Exam Filed Vitals:   06/12/15 1220  BP: 142/78  Pulse: 59  Height:  5\' 5"  (1.651 m)  Weight: 202 lb (91.627 kg)  SpO2: 98%   RA  Gen: well appearing HENT: OP clear, TM's clear, neck supple PULM: Crackles bases, worse R base, normal percussion CV: RRR, slight systolic murmur, trace edema GI: BS+, soft, nontender Derm: no cyanosis or rash Psyche: normal mood and affect        Assessment & Plan:  Asthma She does not have asthma as there is no evidence of airflow obstruction on her lung function testing today.  Plan: Stop Advair  Abnormal CT scan, chest She has an abnormal CT scan of her chest which was seen in January 2017. Hopefully all this represents is resolving community-acquired pneumonia. However, she has crackles on exam today and there were changes on the CT chest which were suggestive of pulmonary fibrosis. Considering her underlying diagnosis of rheumatoid arthritis we have a heightened index of suspicion of this disease.  Plan: Repeat high-resolution CT chest If there is persistent evidence of pulmonary fibrosis then she needs to have an ILD serology panel drawn and will follow-up with me thereafter    Current outpatient prescriptions:  .  albuterol (PROVENTIL HFA;VENTOLIN HFA) 108 (90 Base)  MCG/ACT inhaler, Inhale 2 puffs into the lungs every 6 (six) hours as needed., Disp: 1 Inhaler, Rfl: 5 .  ALPRAZolam (XANAX) 0.25 MG tablet, Take 1 tablet (0.25 mg total) by mouth 3 (three) times daily as needed for anxiety or sleep., Disp: 30 tablet, Rfl: 2 .  amLODipine (NORVASC) 5 MG tablet, Take 1 tablet (5 mg total) by mouth daily., Disp: 90 tablet, Rfl: 1 .  benzonatate (TESSALON) 200 MG capsule, Take 1 capsule (200 mg total) by mouth 2 (two) times daily as needed for cough., Disp: 20 capsule, Rfl: 0 .  cyclobenzaprine (FLEXERIL) 10 MG tablet, Take 1 tablet (10 mg total) by mouth 3 (three) times daily as needed. (Patient taking differently: Take 10 mg by mouth 3 (three) times daily as needed for muscle spasms. ), Disp: 90 tablet, Rfl: 1 .   escitalopram (LEXAPRO) 5 MG tablet, Take 1 tablet (5 mg total) by mouth daily., Disp: 60 tablet, Rfl: 3 .  furosemide (LASIX) 40 MG tablet, Take 1 tablet (40 mg total) by mouth daily., Disp: 90 tablet, Rfl: 1 .  HYDROcodone-acetaminophen (NORCO) 10-325 MG tablet, Take 1 tablet by mouth every 6 (six) hours as needed., Disp: 90 tablet, Rfl: 0 .  lactulose (CHRONULAC) 10 GM/15ML solution, Take 30 mLs (20 g total) by mouth 3 (three) times daily as needed for mild constipation., Disp: 473 mL, Rfl: 1 .  Linaclotide (LINZESS) 290 MCG CAPS capsule, Take 1 capsule (290 mcg total) by mouth daily., Disp: 90 capsule, Rfl: 1 .  losartan (COZAAR) 50 MG tablet, Take 1 tablet (50 mg total) by mouth daily., Disp: 90 tablet, Rfl: 1 .  lovastatin (MEVACOR) 40 MG tablet, Take 2 tablets (80 mg total) by mouth at bedtime., Disp: 180 tablet, Rfl: 1 .  meloxicam (MOBIC) 15 MG tablet, Take 1 tablet (15 mg total) by mouth daily., Disp: 90 tablet, Rfl: 1 .  metoprolol (LOPRESSOR) 50 MG tablet, Take 1 tablet (50 mg total) by mouth 2 (two) times daily., Disp: 180 tablet, Rfl: 1 .  omeprazole (PRILOSEC) 20 MG capsule, TAKE TWO CAPSULES BY MOUTH ONCE DAILY, Disp: 60 capsule, Rfl: 5

## 2015-06-12 NOTE — Patient Instructions (Signed)
We will arrange a CT scan of your chest an then call you with the results  come back in 3 weeks or sooner if needed

## 2015-06-12 NOTE — Assessment & Plan Note (Signed)
She has an abnormal CT scan of her chest which was seen in January 2017. Hopefully all this represents is resolving community-acquired pneumonia. However, she has crackles on exam today and there were changes on the CT chest which were suggestive of pulmonary fibrosis. Considering her underlying diagnosis of rheumatoid arthritis we have a heightened index of suspicion of this disease.  Plan: Repeat high-resolution CT chest If there is persistent evidence of pulmonary fibrosis then she needs to have an ILD serology panel drawn and will follow-up with me thereafter

## 2015-06-18 ENCOUNTER — Inpatient Hospital Stay: Admission: RE | Admit: 2015-06-18 | Payer: Medicare Other | Source: Ambulatory Visit

## 2015-06-24 ENCOUNTER — Ambulatory Visit (INDEPENDENT_AMBULATORY_CARE_PROVIDER_SITE_OTHER)
Admission: RE | Admit: 2015-06-24 | Discharge: 2015-06-24 | Disposition: A | Discharge status: Home / Self Care | Payer: Medicare Other | Source: Ambulatory Visit | Attending: Pulmonary Disease | Admitting: Pulmonary Disease

## 2015-06-24 DIAGNOSIS — R938 Abnormal findings on diagnostic imaging of other specified body structures: Secondary | ICD-10-CM

## 2015-06-24 DIAGNOSIS — R0602 Shortness of breath: Secondary | ICD-10-CM | POA: Diagnosis not present

## 2015-07-01 ENCOUNTER — Telehealth: Payer: Self-pay | Admitting: Pulmonary Disease

## 2015-07-01 NOTE — Telephone Encounter (Signed)
Notes Recorded by Juanito Doom, MD on 06/30/2015 at 6:16 AM A, Please let her know that her CT chest has dramatically improved, but that there are still some scars in the bottom parts of her lungs that we will need to follow with a repeat High Res CT in 12 months. Thanks Miranda [name]  -----------  [email or web address] for pt

## 2015-07-01 NOTE — Telephone Encounter (Signed)
Patient called back returning our call. - PRM

## 2015-07-01 NOTE — Telephone Encounter (Signed)
Spoke with pt and gave results and recommendations. Nothing further needed.

## 2015-08-06 ENCOUNTER — Ambulatory Visit (INDEPENDENT_AMBULATORY_CARE_PROVIDER_SITE_OTHER): Payer: Medicare Other | Admitting: Internal Medicine

## 2015-08-06 ENCOUNTER — Telehealth: Payer: Self-pay | Admitting: Internal Medicine

## 2015-08-06 ENCOUNTER — Encounter: Payer: Self-pay | Admitting: Internal Medicine

## 2015-08-06 VITALS — BP 150/82 | HR 63 | Temp 98.0°F | Resp 20 | Ht 65.0 in | Wt 212.0 lb

## 2015-08-06 DIAGNOSIS — M5440 Lumbago with sciatica, unspecified side: Secondary | ICD-10-CM | POA: Diagnosis not present

## 2015-08-06 DIAGNOSIS — I2510 Atherosclerotic heart disease of native coronary artery without angina pectoris: Secondary | ICD-10-CM

## 2015-08-06 DIAGNOSIS — G8929 Other chronic pain: Secondary | ICD-10-CM

## 2015-08-06 DIAGNOSIS — I10 Essential (primary) hypertension: Secondary | ICD-10-CM

## 2015-08-06 DIAGNOSIS — R938 Abnormal findings on diagnostic imaging of other specified body structures: Secondary | ICD-10-CM | POA: Diagnosis not present

## 2015-08-06 DIAGNOSIS — R9389 Abnormal findings on diagnostic imaging of other specified body structures: Secondary | ICD-10-CM

## 2015-08-06 MED ORDER — HYDROCODONE-ACETAMINOPHEN 10-325 MG PO TABS: 1.0000 | ORAL_TABLET | Freq: Four times a day (QID) | ORAL | Status: DC | PRN

## 2015-08-06 MED ORDER — ESCITALOPRAM OXALATE 5 MG PO TABS: 5.0000 mg | ORAL_TABLET | Freq: Every day | ORAL | Status: DC

## 2015-08-06 MED ORDER — ALPRAZOLAM 0.25 MG PO TABS: 0.2500 mg | ORAL_TABLET | Freq: Three times a day (TID) | ORAL | Status: DC | PRN

## 2015-08-06 MED ORDER — AMLODIPINE BESYLATE 5 MG PO TABS: 5.0000 mg | ORAL_TABLET | Freq: Every day | ORAL | Status: DC

## 2015-08-06 NOTE — Patient Instructions (Signed)
Take medications as prescribed  You  may move around, but avoid painful motions and activities.  Apply heat  to the sore area for 15 to 20 minutes 3 or 4 times daily for the next two to 3 days.  Call or return to clinic prn if these symptoms worsen or fail to improve as anticipated.

## 2015-08-06 NOTE — Progress Notes (Signed)
Subjective:    Patient ID: Miranda Miranda [name], female    DOB: Jul 22, 1945, 70 y.o.   MRN: [id number]  HPI  70 year old patient who has a history of advanced lumbar degenerative joint disease.  For the past week she has had worsening lumbar pain, Miranda [name] greater than the left.  There is been some radiation of pain to the groin area and down the Miranda [name] leg.  She is on a regimen of muscle relaxers, anti-inflammatory medications and analgesics.  She did have a epidural about one month ago.  She does have a call into Dr. Nelva [name]. Pain is aggravated by prolonged sitting and walking and seems to be alleviated by resting in a supine position.  Past Medical History  Diagnosis Date  . GERD (gastroesophageal reflux disease)   . Hyperlipidemia   . Low back pain   . Asthma   . Colon polyps   . Hemorrhoids   . Complication of anesthesia     states requires a lot med. to put her to sleep   . Family history of anesthesia complication   . CHF (congestive heart failure) ([place])     pt. unsure- but thinks she was hosp. for CHF- 2002  . [facility]     [facility]  . Anxiety   . Depression     "sometimes "  . Shortness of breath   . Sleep apnea     uses c-pap- q night recently  . DDD (degenerative disc disease) 09/17/2011  . Glaucoma     bilateral, pt. admits that she is noncompliant to eye gtts.   . Hypertension     had stress, echo- 2006 /w [place], Cardiac Cath, per pt. 2002, echo repeated 2012- wnl      Social History   Social History  . Marital Status: Married    Spouse Name: N/A  . Number of Children: N/A  . Years of Education: N/A   Occupational History  . retired Quarry manager    Social History Main Topics  . Smoking status: Never Smoker   . Smokeless tobacco: Never Used  . Alcohol Use: No  . Drug Use: No  . Sexual Activity: No   Other Topics Concern  . Not on file   Social History Narrative    Past Surgical History  Procedure Laterality Date  . Cardiac  catheterization    . Colonoscopy      remote  . Abdominal hysterectomy      ectopic, fibroids  . Foot surgery      bilat, heel spurs- screw in R foot   . Flexible sigmoidoscopy  02/25/2011    Procedure: FLEXIBLE SIGMOIDOSCOPY;  Surgeon: Inda Castle, MD;  Location: [facility];  Service: Endoscopy;  Laterality: N/A;  . Eye surgery      cataracts removed bilateral- ?IOL  . Sphincterotomy  10/08/2011    Procedure: SPHINCTEROTOMY;  Surgeon: Stark Klein, MD;  Location: [place];  Service: General;  Laterality: N/A;  . Fissurectomy  10/08/2011    Procedure: FISSURECTOMY;  Surgeon: Stark Klein, MD;  Location: [place];  Service: General;  Laterality: N/A;  . Hemorrhoid surgery  10/08/2011    Procedure: HEMORRHOIDECTOMY;  Surgeon: Stark Klein, MD;  Location: [place];  Service: General;  Laterality: N/A;  External     Family History  Problem Relation Age of Onset  . Cancer Mother     breast  . Heart attack Mother   . Heart disease Mother   . Cancer Other  breast, colon, lung  . Heart disease Other   . Anesthesia problems Neg Hx   . Emphysema Sister   . Cancer Sister     breast  . Asthma Sister   . Heart disease Sister   . Rheum arthritis Sister   . Cancer Brother     colon    Allergies  Allergen Reactions  . Fish Oil Anaphylaxis  . Other Hives, Shortness Of Breath and Swelling    Allergic to cashew nuts and peanut oil.  Marland Kitchen Penicillins Anaphylaxis, Hives and Swelling    Has patient had a PCN reaction causing immediate rash, facial/tongue/throat swelling, SOB or lightheadedness with hypotension: Face swelling and hives started first, then swelling of the throat  Has patient had a PCN reaction causing severe rash involving mucus membranes or skin necrosis: Yes  Has patient had a PCN reaction that required hospitalization: No  Has patient had a PCN reaction occurring within the last 10 years: Yes  If all of the above answers are "NO", then may proceed with Cephalospor  .  Pneumococcal Vaccines Nausea And Vomiting  . Aspirin Itching and Rash  . Bactrim [Sulfamethoxazole-Trimethoprim] Hives, Itching and Rash  . Ciprofloxacin Hives, Itching and Rash  . Ibuprofen Rash  . Influenza Vaccines Hives  . Ivp Dye [Iodinated Diagnostic Agents] Hives, Itching and Rash    Gives benadryl to counteract symptoms  . Latex Rash  . Macrobid [Nitrofurantoin Monohydrate Macrocrystals] Hives  . Shellfish Allergy Hives    Patient also allergic to seafood    Current Outpatient Prescriptions on File Prior to Visit  Medication Sig Dispense Refill  . albuterol (PROVENTIL HFA;VENTOLIN HFA) 108 (90 Base) MCG/ACT inhaler Inhale 2 puffs into the lungs every 6 (six) hours as needed. 1 Inhaler 5  . benzonatate (TESSALON) 200 MG capsule Take 1 capsule (200 mg total) by mouth 2 (two) times daily as needed for cough. 20 capsule 0  . cyclobenzaprine (FLEXERIL) 10 MG tablet Take 1 tablet (10 mg total) by mouth 3 (three) times daily as needed. (Patient taking differently: Take 10 mg by mouth 3 (three) times daily as needed for muscle spasms. ) 90 tablet 1  . furosemide (LASIX) 40 MG tablet Take 1 tablet (40 mg total) by mouth daily. 90 tablet 1  . lactulose (CHRONULAC) 10 GM/15ML solution Take 30 mLs (20 g total) by mouth 3 (three) times daily as needed for mild constipation. 473 mL 1  . Linaclotide (LINZESS) 290 MCG CAPS capsule Take 1 capsule (290 mcg total) by mouth daily. 90 capsule 1  . losartan (COZAAR) 50 MG tablet Take 1 tablet (50 mg total) by mouth daily. 90 tablet 1  . lovastatin (MEVACOR) 40 MG tablet Take 2 tablets (80 mg total) by mouth at bedtime. 180 tablet 1  . meloxicam (MOBIC) 15 MG tablet Take 1 tablet (15 mg total) by mouth daily. 90 tablet 1  . metoprolol (LOPRESSOR) 50 MG tablet Take 1 tablet (50 mg total) by mouth 2 (two) times daily. 180 tablet 1  . omeprazole (PRILOSEC) 20 MG capsule TAKE TWO CAPSULES BY MOUTH ONCE DAILY 60 capsule 5   No current facility-administered  medications on file prior to visit.    BP 150/82 mmHg  Pulse 63  Temp(Src) 98 F (36.7 C) (Oral)  Resp 20  Ht 5\' 5"  (1.651 m)  Wt 212 lb (96.163 kg)  BMI 35.28 kg/m2  SpO2 99%       Review of Systems  Constitutional: Negative.   HENT: Negative  for congestion, dental problem, hearing loss, rhinorrhea, sinus pressure, sore throat and tinnitus.   Eyes: Negative for pain, discharge and visual disturbance.  Respiratory: Negative for cough and shortness of breath.   Cardiovascular: Negative for chest pain, palpitations and leg swelling.  Gastrointestinal: Negative for nausea, vomiting, abdominal pain, diarrhea, constipation, blood in stool and abdominal distention.  Genitourinary: Negative for dysuria, urgency, frequency, hematuria, flank pain, vaginal bleeding, vaginal discharge, difficulty urinating, vaginal pain and pelvic pain.  Musculoskeletal: Negative for joint swelling, arthralgias and gait problem.  Skin: Negative for rash.  Neurological: Negative for dizziness, syncope, speech difficulty, weakness, numbness and headaches.  Hematological: Negative for adenopathy.  Psychiatric/Behavioral: Negative for behavioral problems, dysphoric mood and agitation. The patient is not nervous/anxious.        Objective:   Physical Exam  Constitutional: She is oriented to person, place, and time. She appears well-developed and well-nourished.  HENT:  Head: Normocephalic.  Miranda [name] Ear: External ear normal.  Left Ear: External ear normal.  Mouth/Throat: Oropharynx is clear and moist.  Eyes: Conjunctivae and EOM are normal. Pupils are equal, round, and reactive to light.  Neck: Normal range of motion. Neck supple. No thyromegaly present.  Cardiovascular: Normal rate, regular rhythm, normal heart sounds and intact distal pulses.   Pulmonary/Chest: Effort normal and breath sounds normal.  Abdominal: Soft. Bowel sounds are normal. She exhibits no mass. There is no tenderness.    Musculoskeletal: Normal range of motion.  Tenderness to gentle palpation of the lumbar spine Full range of motion both hips Straight leg test mildly positive Miranda [name] greater than left  Lymphadenopathy:    She has no cervical adenopathy.  Neurological: She is alert and oriented to person, place, and time.  Skin: Skin is warm and dry. No rash noted.  Psychiatric: She has a normal mood and affect. Her behavior is normal.          Assessment & Plan:   Advanced DJD lumbar spine Acute on chronic low back pain.  Follow-up Dr. Nelva [name]- may consider another epidural Hypertension Dyslipidemia  Nyoka Cowden, MD

## 2015-08-06 NOTE — Telephone Encounter (Signed)
Pt was here for an appt today. Rx printed and signed and given to pt by Dr.K.

## 2015-08-06 NOTE — Progress Notes (Signed)
Pre visit review using our clinic review tool, if applicable. No additional management support is needed unless otherwise documented below in the visit note. 

## 2015-08-06 NOTE — Telephone Encounter (Signed)
Pt request refill of the following:    HYDROcodone-acetaminophen (NORCO) 10-325 MG tablet  Pt has an appt at 10:45 today     Phamacy:

## 2015-08-28 DIAGNOSIS — M5136 Other intervertebral disc degeneration, lumbar region: Secondary | ICD-10-CM | POA: Diagnosis not present

## 2015-08-29 ENCOUNTER — Telehealth: Payer: Self-pay | Admitting: *Deleted

## 2015-08-29 NOTE — Telephone Encounter (Signed)
Pt left message on voicemail requesting refill on pain medication Hydrocodone, last fill 5/23 # 90. Please advise if can fill?

## 2015-08-30 MED ORDER — HYDROCODONE-ACETAMINOPHEN 10-325 MG PO TABS: 1.0000 | ORAL_TABLET | Freq: Four times a day (QID) | ORAL | Status: DC | PRN

## 2015-08-30 NOTE — Telephone Encounter (Signed)
Okay to refill? 

## 2015-08-30 NOTE — Telephone Encounter (Signed)
Pt notified Rx ready for pickup. Rx printed and signed.  

## 2015-09-03 DIAGNOSIS — H40013 Open angle with borderline findings, low risk, bilateral: Secondary | ICD-10-CM | POA: Diagnosis not present

## 2015-10-10 ENCOUNTER — Other Ambulatory Visit: Payer: Self-pay | Admitting: Internal Medicine

## 2015-10-10 NOTE — Telephone Encounter (Signed)
Rx refill sent to pharmacy. 

## 2015-10-14 ENCOUNTER — Telehealth: Payer: Self-pay | Admitting: Internal Medicine

## 2015-10-14 ENCOUNTER — Other Ambulatory Visit: Payer: Self-pay

## 2015-10-14 MED ORDER — LINACLOTIDE 290 MCG PO CAPS: 290.0000 ug | ORAL_CAPSULE | Freq: Every day | ORAL | 0 refills | Status: DC

## 2015-10-14 MED ORDER — ESCITALOPRAM OXALATE 5 MG PO TABS: 5.0000 mg | ORAL_TABLET | Freq: Every day | ORAL | 0 refills | Status: DC

## 2015-10-14 MED ORDER — AMLODIPINE BESYLATE 5 MG PO TABS
5.0000 mg | ORAL_TABLET | Freq: Every day | ORAL | 0 refills | Status: DC
Start: 2015-10-14 — End: 2015-10-18

## 2015-10-14 MED ORDER — ALBUTEROL SULFATE HFA 108 (90 BASE) MCG/ACT IN AERS: 2.0000 | INHALATION_SPRAY | Freq: Four times a day (QID) | RESPIRATORY_TRACT | 0 refills | Status: DC | PRN

## 2015-10-14 MED ORDER — METOPROLOL TARTRATE 50 MG PO TABS: 50.0000 mg | ORAL_TABLET | Freq: Two times a day (BID) | ORAL | 0 refills | Status: DC

## 2015-10-14 MED ORDER — FUROSEMIDE 40 MG PO TABS: 40.0000 mg | ORAL_TABLET | Freq: Every day | ORAL | 0 refills | Status: DC

## 2015-10-14 MED ORDER — LOSARTAN POTASSIUM 50 MG PO TABS: 50.0000 mg | ORAL_TABLET | Freq: Every day | ORAL | 0 refills | Status: DC

## 2015-10-14 NOTE — Telephone Encounter (Addendum)
Pt request refill HYDROcodone-acetaminophen (NORCO) 10-325 MG tablet (will pick up at Friday appt)  ALSO refills of: furosemide (LASIX) 40 MG tablet metoprolol (LOPRESSOR) 50 MG tablet meloxicam (MOBIC) 15 MG tablet ALPRAZolam (XANAX) 0.25 MG tablet amLODipine (NORVASC) 5 MG tablet escitalopram (LEXAPRO) 5 MG tablet  albuterol (PROVENTIL HFA;VENTOLIN HFA) 108 (90 Base) MCG/ACT inhaler  losartan (COZAAR) 50 MG tablet Linaclotide (LINZESS) 290 MCG CAPS capsule  Pt has made appt for Friday, 8/4 for follow up meds, but will be out of her medications by then. Can you send to   [facility]

## 2015-10-14 NOTE — Telephone Encounter (Signed)
Okay to refill? 

## 2015-10-15 MED ORDER — HYDROCODONE-ACETAMINOPHEN 10-325 MG PO TABS: 1.0000 | ORAL_TABLET | Freq: Four times a day (QID) | ORAL | 0 refills | Status: DC | PRN

## 2015-10-15 NOTE — Telephone Encounter (Signed)
Hydrocodone printed for signature.

## 2015-10-15 NOTE — Telephone Encounter (Signed)
Alprazolam Rx was written 5/23 with 5 refills, patient should not need another refill yet. Hydrocodone placed in envelope up front for patient to pick up on Friday.

## 2015-10-18 ENCOUNTER — Encounter: Payer: Self-pay | Admitting: Internal Medicine

## 2015-10-18 ENCOUNTER — Ambulatory Visit (INDEPENDENT_AMBULATORY_CARE_PROVIDER_SITE_OTHER): Payer: Medicare Other | Admitting: Internal Medicine

## 2015-10-18 VITALS — BP 154/78 | HR 48 | Temp 98.8°F | Wt 207.8 lb

## 2015-10-18 DIAGNOSIS — I10 Essential (primary) hypertension: Secondary | ICD-10-CM

## 2015-10-18 DIAGNOSIS — E785 Hyperlipidemia, unspecified: Secondary | ICD-10-CM

## 2015-10-18 MED ORDER — AMLODIPINE BESYLATE 10 MG PO TABS: 10.0000 mg | ORAL_TABLET | Freq: Every day | ORAL | 3 refills | Status: DC

## 2015-10-18 NOTE — Patient Instructions (Signed)
Increase amlodipine to 10 mg daily  Limit your sodium (Salt) intake  Please check your blood pressure on a regular basis.  If it is consistently greater than 150/90, please make an office appointment.

## 2015-10-18 NOTE — Progress Notes (Signed)
Subjective:    Patient ID: Miranda [name], female    DOB: [birth year], 70 y.o.   MRN: [id number]  HPI 70 year old patient who is seen today for follow-up.  She has a history of hypertension.  She states systolic readings are generally elevated, especially in the early morning hours but improved throughout the day.  Morning systolic blood pressure readings are occasionally as high as 160.  In general, she is doing well She continues to have back pain with radiation to the [name] groin and down the [name] leg.  Since her last visit here, she has had another epidural. She requires analgesics and these were refilled today  She is requesting a medical excuse for jury service due to her chronic back pain and inability to sit for long periods of time  Past Medical History:  Diagnosis Date  . Anxiety   . Asthma   . CHF (congestive heart failure) ([place])    pt. unsure- but thinks she was hosp. for CHF- 2002  . [facility]    [facility]  . Colon polyps   . Complication of anesthesia    states requires a lot med. to put her to sleep   . DDD (degenerative disc disease) 09/17/2011  . Depression    "sometimes "  . Family history of anesthesia complication   . GERD (gastroesophageal reflux disease)   . Glaucoma    bilateral, pt. admits that she is noncompliant to eye gtts.   . Hemorrhoids   . Hyperlipidemia   . Hypertension    had stress, echo- 2006 /w [place], Cardiac Cath, per pt. 2002, echo repeated 2012- wnl   . Low back pain   . Shortness of breath   . Sleep apnea    uses c-pap- q night recently     Social History   Social History  . Marital status: Married    Spouse name: N/A  . Number of children: N/A  . Years of education: N/A   Occupational History  . retired Quarry manager Unemployed   Social History Main Topics  . Smoking status: Never Smoker  . Smokeless tobacco: Never Used  . Alcohol use No  . Drug use: No  . Sexual activity: No   Other Topics Concern    . Not on file   Social History Narrative  . No narrative on file    Past Surgical History:  Procedure Laterality Date  . ABDOMINAL HYSTERECTOMY     ectopic, fibroids  . CARDIAC CATHETERIZATION    . COLONOSCOPY     remote  . EYE SURGERY     cataracts removed bilateral- ?IOL  . FISSURECTOMY  10/08/2011   Procedure: FISSURECTOMY;  Surgeon: Stark Klein, MD;  Location: [place];  Service: General;  Laterality: N/A;  . FLEXIBLE SIGMOIDOSCOPY  02/25/2011   Procedure: FLEXIBLE SIGMOIDOSCOPY;  Surgeon: Inda Castle, MD;  Location: [facility];  Service: Endoscopy;  Laterality: N/A;  . FOOT SURGERY     bilat, heel spurs- screw in R foot   . HEMORRHOID SURGERY  10/08/2011   Procedure: HEMORRHOIDECTOMY;  Surgeon: Stark Klein, MD;  Location: [place];  Service: General;  Laterality: N/A;  External   . SPHINCTEROTOMY  10/08/2011   Procedure: Joan Mayans;  Surgeon: Stark Klein, MD;  Location: [facility];  Service: General;  Laterality: N/A;    Family History  Problem Relation Age of Onset  . Cancer Mother     breast  . Heart attack Mother   .  Heart disease Mother   . Cancer Other     breast, colon, lung  . Heart disease Other   . Anesthesia problems Neg Hx   . Emphysema Sister   . Cancer Sister     breast  . Asthma Sister   . Heart disease Sister   . Rheum arthritis Sister   . Cancer Brother     colon    Allergies  Allergen Reactions  . Fish Oil Anaphylaxis  . Other Hives, Shortness Of Breath and Swelling    Allergic to cashew nuts and peanut oil.  Marland Kitchen Penicillins Anaphylaxis, Hives and Swelling    Has patient had a PCN reaction causing immediate rash, facial/tongue/throat swelling, SOB or lightheadedness with hypotension: Face swelling and hives started first, then swelling of the throat  Has patient had a PCN reaction causing severe rash involving mucus membranes or skin necrosis: Yes  Has patient had a PCN reaction that required hospitalization: No  Has patient had a PCN  reaction occurring within the last 10 years: Yes  If all of the above answers are "NO", then may proceed with Cephalospor  . Pneumococcal Vaccines Nausea And Vomiting  . Aspirin Itching and Rash  . Bactrim [Sulfamethoxazole-Trimethoprim] Hives, Itching and Rash  . Ciprofloxacin Hives, Itching and Rash  . Ibuprofen Rash  . Influenza Vaccines Hives  . Ivp Dye [Iodinated Diagnostic Agents] Hives, Itching and Rash    Gives benadryl to counteract symptoms  . Latex Rash  . Macrobid [Nitrofurantoin Monohydrate Macrocrystals] Hives  . Shellfish Allergy Hives    Patient also allergic to seafood    Current Outpatient Prescriptions on File Prior to Visit  Medication Sig Dispense Refill  . albuterol (PROVENTIL HFA;VENTOLIN HFA) 108 (90 Base) MCG/ACT inhaler Inhale 2 puffs into the lungs every 6 (six) hours as needed. 1 Inhaler 0  . ALPRAZolam (XANAX) 0.25 MG tablet Take 1 tablet (0.25 mg total) by mouth 3 (three) times daily as needed for anxiety or sleep. 30 tablet 5  . amLODipine (NORVASC) 5 MG tablet Take 1 tablet (5 mg total) by mouth daily. 30 tablet 0  . benzonatate (TESSALON) 200 MG capsule Take 1 capsule (200 mg total) by mouth 2 (two) times daily as needed for cough. 20 capsule 0  . cyclobenzaprine (FLEXERIL) 10 MG tablet Take 1 tablet (10 mg total) by mouth 3 (three) times daily as needed. (Patient taking differently: Take 10 mg by mouth 3 (three) times daily as needed for muscle spasms. ) 90 tablet 1  . escitalopram (LEXAPRO) 5 MG tablet Take 1 tablet (5 mg total) by mouth daily. 30 tablet 0  . furosemide (LASIX) 40 MG tablet Take 1 tablet (40 mg total) by mouth daily. 30 tablet 0  . HYDROcodone-acetaminophen (NORCO) 10-325 MG tablet Take 1 tablet by mouth every 6 (six) hours as needed. 90 tablet 0  . lactulose (CHRONULAC) 10 GM/15ML solution Take 30 mLs (20 g total) by mouth 3 (three) times daily as needed for mild constipation. 473 mL 1  . linaclotide (LINZESS) 290 MCG CAPS capsule Take 1  capsule (290 mcg total) by mouth daily. 30 capsule 0  . losartan (COZAAR) 50 MG tablet Take 1 tablet (50 mg total) by mouth daily. 30 tablet 0  . lovastatin (MEVACOR) 40 MG tablet Take 2 tablets (80 mg total) by mouth at bedtime. 180 tablet 1  . meloxicam (MOBIC) 15 MG tablet Take 1 tablet (15 mg total) by mouth daily. 90 tablet 1  . metoprolol (LOPRESSOR) 50  MG tablet Take 1 tablet (50 mg total) by mouth 2 (two) times daily. 60 tablet 0  . omeprazole (PRILOSEC) 20 MG capsule TAKE TWO CAPSULES BY MOUTH ONCE DAILY 60 capsule 5   No current facility-administered medications on file prior to visit.     BP (!) 154/78 (BP Location: Left Arm, Patient Position: Sitting, Cuff Size: Large)   Pulse (!) 48   Temp 98.8 F (37.1 C) (Oral)   Wt 207 lb 12.8 oz (94.3 kg)   SpO2 97%   BMI 34.58 kg/m      Review of Systems  Constitutional: Negative.   HENT: Negative for congestion, dental problem, hearing loss, rhinorrhea, sinus pressure, sore throat and tinnitus.   Eyes: Negative for pain, discharge and visual disturbance.  Respiratory: Negative for cough and shortness of breath.   Cardiovascular: Negative for chest pain, palpitations and leg swelling.  Gastrointestinal: Negative for abdominal distention, abdominal pain, blood in stool, constipation, diarrhea, nausea and vomiting.  Genitourinary: Negative for difficulty urinating, dysuria, flank pain, frequency, hematuria, pelvic pain, urgency, vaginal bleeding, vaginal discharge and vaginal pain.  Musculoskeletal: Positive for back pain. Negative for arthralgias, gait problem and joint swelling.  Skin: Negative for rash.  Neurological: Negative for dizziness, syncope, speech difficulty, weakness, numbness and headaches.  Hematological: Negative for adenopathy.  Psychiatric/Behavioral: Positive for dysphoric mood. Negative for agitation and behavioral problems. The patient is nervous/anxious.        Objective:   Physical Exam  Constitutional:  She is oriented to person, place, and time. She appears well-developed and well-nourished.  HENT:  Head: Normocephalic.  [name] Ear: External ear normal.  Left Ear: External ear normal.  Mouth/Throat: Oropharynx is clear and moist.  Eyes: Conjunctivae and EOM are normal. Pupils are equal, round, and reactive to light.  Neck: Normal range of motion. Neck supple. No thyromegaly present.  Cardiovascular: Normal rate, regular rhythm, normal heart sounds and intact distal pulses.   Pulmonary/Chest: Effort normal and breath sounds normal.  Abdominal: Soft. Bowel sounds are normal. She exhibits no mass. There is no tenderness.  Musculoskeletal: Normal range of motion.  Lymphadenopathy:    She has no cervical adenopathy.  Neurological: She is alert and oriented to person, place, and time.  Skin: Skin is warm and dry. No rash noted.  Psychiatric: She has a normal mood and affect. Her behavior is normal.          Assessment & Plan:   Hypertension.  Systolic blood pressure readings have consistently been elevated blood pressure today 160 over 70.  Will increase amlodipine to 10 mg daily Chronic low back pain with [name]-sided sciatica.  A letter was dictated on her behalf for jury service.  Post palma due to her chronic low back pain History anxiety, depression, stable  Recheck 3 months Jury service letter dictated  Nyoka Cowden, MD

## 2015-10-18 NOTE — Progress Notes (Signed)
Pre visit review using our clinic review tool, if applicable. No additional management support is needed unless otherwise documented below in the visit note. 

## 2015-11-11 ENCOUNTER — Encounter: Payer: Self-pay | Admitting: Family Medicine

## 2015-11-11 ENCOUNTER — Ambulatory Visit (INDEPENDENT_AMBULATORY_CARE_PROVIDER_SITE_OTHER): Payer: Medicare Other | Admitting: Family Medicine

## 2015-11-11 VITALS — BP 140/78 | HR 76 | Temp 98.5°F | Ht 65.0 in | Wt 212.2 lb

## 2015-11-11 DIAGNOSIS — J4521 Mild intermittent asthma with (acute) exacerbation: Secondary | ICD-10-CM

## 2015-11-11 MED ORDER — PREDNISONE 20 MG PO TABS: 40.0000 mg | ORAL_TABLET | Freq: Every day | ORAL | 0 refills | Status: DC

## 2015-11-11 MED ORDER — BENZONATATE 100 MG PO CAPS: 100.0000 mg | ORAL_CAPSULE | Freq: Three times a day (TID) | ORAL | 0 refills | Status: DC | PRN

## 2015-11-11 NOTE — Progress Notes (Signed)
Pre visit review using our clinic review tool, if applicable. No additional management support is needed unless otherwise documented below in the visit note. 

## 2015-11-11 NOTE — Progress Notes (Signed)
HPI:  Acute visit for:  Cough -started: about 1 week ago -symptoms:nasal congestion, sore throat, cough, wheezing - her albuterol helps, using 1-2 times daily -denies:fever, SOB, NVD, tooth pain -has tried: alb -sick contacts/travel/risks: no reported flu, strep or tick exposure -Hx of: allergies, asthma  ROS: See pertinent positives and negatives per HPI.  Past Medical History:  Diagnosis Date  . Anxiety   . Asthma   . CHF (congestive heart failure) ([place])    pt. unsure- but thinks she was hosp. for CHF- 2002  . [facility] disease    [facility]  . Colon polyps   . Complication of anesthesia    states requires a lot med. to put her to sleep   . DDD (degenerative disc disease) 09/17/2011  . Depression    "sometimes "  . Family history of anesthesia complication   . GERD (gastroesophageal reflux disease)   . Glaucoma    bilateral, pt. admits that she is noncompliant to eye gtts.   . Hemorrhoids   . Hyperlipidemia   . Hypertension    had stress, echo- 2006 /w [place], Cardiac Cath, per pt. 2002, echo repeated 2012- wnl   . Low back pain   . Shortness of breath   . Sleep apnea    uses c-pap- q night recently    Past Surgical History:  Procedure Laterality Date  . ABDOMINAL HYSTERECTOMY     ectopic, fibroids  . CARDIAC CATHETERIZATION    . COLONOSCOPY     remote  . EYE SURGERY     cataracts removed bilateral- ?IOL  . FISSURECTOMY  10/08/2011   Procedure: FISSURECTOMY;  Surgeon: Stark Klein, MD;  Location: [place];  Service: General;  Laterality: N/A;  . FLEXIBLE SIGMOIDOSCOPY  02/25/2011   Procedure: FLEXIBLE SIGMOIDOSCOPY;  Surgeon: Inda Castle, MD;  Location: [facility];  Service: Endoscopy;  Laterality: N/A;  . FOOT SURGERY     bilat, heel spurs- screw in R foot   . HEMORRHOID SURGERY  10/08/2011   Procedure: HEMORRHOIDECTOMY;  Surgeon: Stark Klein, MD;  Location: [place];  Service: General;  Laterality: N/A;  External   . SPHINCTEROTOMY   10/08/2011   Procedure: Joan Mayans;  Surgeon: Stark Klein, MD;  Location: [facility];  Service: General;  Laterality: N/A;    Family History  Problem Relation Age of Onset  . Cancer Mother     breast  . Heart attack Mother   . Heart disease Mother   . Cancer Other     breast, colon, lung  . Heart disease Other   . Anesthesia problems Neg Hx   . Emphysema Sister   . Cancer Sister     breast  . Asthma Sister   . Heart disease Sister   . Rheum arthritis Sister   . Cancer Brother     colon    Social History   Social History  . Marital status: Married    Spouse name: N/A  . Number of children: N/A  . Years of education: N/A   Occupational History  . retired Quarry manager Unemployed   Social History Main Topics  . Smoking status: Never Smoker  . Smokeless tobacco: Never Used  . Alcohol use No  . Drug use: No  . Sexual activity: No   Other Topics Concern  . None   Social History Narrative  . None     Current Outpatient Prescriptions:  .  albuterol (PROVENTIL HFA;VENTOLIN HFA) 108 (90 Base) MCG/ACT inhaler, Inhale  2 puffs into the lungs every 6 (six) hours as needed., Disp: 1 Inhaler, Rfl: 0 .  ALPRAZolam (XANAX) 0.25 MG tablet, Take 1 tablet (0.25 mg total) by mouth 3 (three) times daily as needed for anxiety or sleep., Disp: 30 tablet, Rfl: 5 .  amLODipine (NORVASC) 10 MG tablet, Take 1 tablet (10 mg total) by mouth daily., Disp: 90 tablet, Rfl: 3 .  cyclobenzaprine (FLEXERIL) 10 MG tablet, Take 1 tablet (10 mg total) by mouth 3 (three) times daily as needed. (Patient taking differently: Take 10 mg by mouth 3 (three) times daily as needed for muscle spasms. ), Disp: 90 tablet, Rfl: 1 .  escitalopram (LEXAPRO) 5 MG tablet, Take 1 tablet (5 mg total) by mouth daily., Disp: 30 tablet, Rfl: 0 .  furosemide (LASIX) 40 MG tablet, Take 1 tablet (40 mg total) by mouth daily., Disp: 30 tablet, Rfl: 0 .  HYDROcodone-acetaminophen (NORCO) 10-325 MG tablet, Take 1 tablet by mouth every 6  (six) hours as needed., Disp: 90 tablet, Rfl: 0 .  lactulose (CHRONULAC) 10 GM/15ML solution, Take 30 mLs (20 g total) by mouth 3 (three) times daily as needed for mild constipation., Disp: 473 mL, Rfl: 1 .  linaclotide (LINZESS) 290 MCG CAPS capsule, Take 1 capsule (290 mcg total) by mouth daily., Disp: 30 capsule, Rfl: 0 .  losartan (COZAAR) 50 MG tablet, Take 1 tablet (50 mg total) by mouth daily., Disp: 30 tablet, Rfl: 0 .  lovastatin (MEVACOR) 40 MG tablet, Take 2 tablets (80 mg total) by mouth at bedtime., Disp: 180 tablet, Rfl: 1 .  meloxicam (MOBIC) 15 MG tablet, Take 1 tablet (15 mg total) by mouth daily., Disp: 90 tablet, Rfl: 1 .  metoprolol (LOPRESSOR) 50 MG tablet, Take 1 tablet (50 mg total) by mouth 2 (two) times daily., Disp: 60 tablet, Rfl: 0 .  omeprazole (PRILOSEC) 20 MG capsule, TAKE TWO CAPSULES BY MOUTH ONCE DAILY, Disp: 60 capsule, Rfl: 5 .  benzonatate (TESSALON PERLES) 100 MG capsule, Take 1 capsule (100 mg total) by mouth 3 (three) times daily as needed., Disp: 20 capsule, Rfl: 0 .  predniSONE (DELTASONE) 20 MG tablet, Take 2 tablets (40 mg total) by mouth daily with breakfast., Disp: 8 tablet, Rfl: 0  EXAM:  Vitals:   11/11/15 1535  BP: 140/78  Pulse: 76  Temp: 98.5 F (36.9 C)    Body mass index is 35.31 kg/m.  GENERAL: vitals reviewed and listed above, alert, oriented, appears well hydrated and in no acute distress  HEENT: atraumatic, conjunttiva clear, no obvious abnormalities on inspection of external nose and ears, normal appearance of ear canals and TMs, clear nasal congestion, mild post oropharyngeal erythema with PND, no tonsillar edema or exudate, no sinus TTP  NECK: no obvious masses on inspection  LUNGS: clear to auscultation bilaterally, no wheezes, rales or rhonchi, good air movement, prolong exp phase  CV: HRRR, no peripheral edema  MS: moves all extremities without noticeable abnormality  PSYCH: pleasant and cooperative, no obvious  depression or anxiety  ASSESSMENT AND PLAN:  Discussed the following assessment and plan:  Asthma, mild intermittent, with acute exacerbation   We discussed potential etiologies, with mild asthma exacerbation 2ndary to VURI or allergies being most likely. Opted to treat with prednisone, tessalon, prn alb. We discussed treatment side effects, likely course, antibiotic misuse, transmission, and signs of developing a serious illness. Of course, we advised her to return or notify a doctor immediately if symptoms worsen or persist or new concerns  arise.    Patient Instructions  Start the prednisone and take 40mg  (2 tablets) daily for 4 days.  Use the albuterol and tessalon as needed per instructions.  Follow up with your doctor as needed if worsening, new symptoms or symptoms persist despite treatment.   Colin Benton R., DO

## 2015-11-11 NOTE — Patient Instructions (Signed)
Start the prednisone and take 40mg  (2 tablets) daily for 4 days.  Use the albuterol and tessalon as needed per instructions.  Follow up with your doctor as needed if worsening, new symptoms or symptoms persist despite treatment.

## 2015-11-28 ENCOUNTER — Telehealth: Payer: Self-pay | Admitting: Internal Medicine

## 2015-11-28 MED ORDER — FLUTICASONE-SALMETEROL 250-50 MCG/DOSE IN AEPB: 1.0000 | INHALATION_SPRAY | Freq: Two times a day (BID) | RESPIRATORY_TRACT | 5 refills | Status: DC

## 2015-11-28 MED ORDER — LOVASTATIN 40 MG PO TABS: 80.0000 mg | ORAL_TABLET | Freq: Every day | ORAL | 1 refills | Status: DC

## 2015-11-28 NOTE — Telephone Encounter (Signed)
Spoke to pt, told her I will send Rx's for Advair and Losartan to the pharmacy and Hydrocodone Rx will be ready for pickup tomorrow. Pt verbalized understanding. Rx's sent to pharmacy.

## 2015-11-28 NOTE — Telephone Encounter (Signed)
° °  Pt request refill of the following:    HYDROcodone-acetaminophen (NORCO) 10-325 MG     losartan (COZAAR) 50 MG tablet  AVAIR INHALER     Phamacy:  [facility]

## 2015-11-29 MED ORDER — HYDROCODONE-ACETAMINOPHEN 10-325 MG PO TABS: 1.0000 | ORAL_TABLET | Freq: Four times a day (QID) | ORAL | 0 refills | Status: DC | PRN

## 2015-11-29 NOTE — Telephone Encounter (Signed)
Left message on voicemail Rx ready for pickup, will be at the front desk. Rx printed and signed. 

## 2015-12-02 MED ORDER — LOSARTAN POTASSIUM 50 MG PO TABS: 50.0000 mg | ORAL_TABLET | Freq: Every day | ORAL | 2 refills | Status: DC

## 2015-12-02 NOTE — Addendum Note (Signed)
Addended by: Marian Sorrow on: 12/02/2015 09:30 AM   Modules accepted: Orders

## 2015-12-02 NOTE — Telephone Encounter (Signed)
Spoke to pt, told her Rx sent to pharmacy and reminded her she is due for a physical. Pt verbalized understanding.

## 2015-12-02 NOTE — Telephone Encounter (Signed)
Pt's Rx losartan (COZAAR) 50 MG tablet   was not at the pharmacy. Can you resend?  [facility]  Pt is out and bp is elevated. Needs rx asap

## 2015-12-02 NOTE — Telephone Encounter (Signed)
Rx sent to pharmacy again.  

## 2015-12-09 ENCOUNTER — Encounter: Payer: Self-pay | Admitting: Internal Medicine

## 2015-12-09 ENCOUNTER — Ambulatory Visit (INDEPENDENT_AMBULATORY_CARE_PROVIDER_SITE_OTHER): Payer: Medicare Other | Admitting: Internal Medicine

## 2015-12-09 VITALS — BP 130/82 | HR 49 | Temp 98.0°F | Resp 20 | Ht 65.0 in | Wt 210.5 lb

## 2015-12-09 DIAGNOSIS — I10 Essential (primary) hypertension: Secondary | ICD-10-CM

## 2015-12-09 DIAGNOSIS — K219 Gastro-esophageal reflux disease without esophagitis: Secondary | ICD-10-CM

## 2015-12-09 DIAGNOSIS — E785 Hyperlipidemia, unspecified: Secondary | ICD-10-CM

## 2015-12-09 DIAGNOSIS — M545 Low back pain: Secondary | ICD-10-CM

## 2015-12-09 NOTE — Patient Instructions (Signed)

## 2015-12-09 NOTE — Progress Notes (Signed)
Pre visit review using our clinic review tool, if applicable. No additional management support is needed unless otherwise documented below in the visit note. 

## 2015-12-09 NOTE — Progress Notes (Signed)
Subjective:    Patient ID: Miranda [name], female    DOB: [birth year], 70 y.o.   MRN: [id number]  HPI  70 year old patient who has essential hypertension.  She is seen today for follow-up.  She has a history of asthma, chronic constipation and chronic low back pain.  She is followed by chronic pain management.  She is on hydrocodone and has required epidurals.  She has degenerative joint disease. In general doing fairly well on her present regimen. No new concerns or complaints.  Past Medical History:  Diagnosis Date  . Anxiety   . Asthma   . CHF (congestive heart failure) ([place])    pt. unsure- but thinks she was hosp. for CHF- 2002  . [facility]    [facility]  . Colon polyps   . Complication of anesthesia    states requires a lot med. to put her to sleep   . DDD (degenerative disc disease) 09/17/2011  . Depression    "sometimes "  . Family history of anesthesia complication   . GERD (gastroesophageal reflux disease)   . Glaucoma    bilateral, pt. admits that she is noncompliant to eye gtts.   . Hemorrhoids   . Hyperlipidemia   . Hypertension    had stress, echo- 2006 /w [place], Cardiac Cath, per pt. 2002, echo repeated 2012- wnl   . Low back pain   . Shortness of breath   . Sleep apnea    uses c-pap- q night recently     Social History   Social History  . Marital status: Married    Spouse name: N/A  . Number of children: N/A  . Years of education: N/A   Occupational History  . retired Quarry manager Unemployed   Social History Main Topics  . Smoking status: Never Smoker  . Smokeless tobacco: Never Used  . Alcohol use No  . Drug use: No  . Sexual activity: No   Other Topics Concern  . Not on file   Social History Narrative  . No narrative on file    Past Surgical History:  Procedure Laterality Date  . ABDOMINAL HYSTERECTOMY     ectopic, fibroids  . CARDIAC CATHETERIZATION    . COLONOSCOPY     remote  . EYE SURGERY     cataracts  removed bilateral- ?IOL  . FISSURECTOMY  10/08/2011   Procedure: FISSURECTOMY;  Surgeon: Stark Klein, MD;  Location: [place];  Service: General;  Laterality: N/A;  . FLEXIBLE SIGMOIDOSCOPY  02/25/2011   Procedure: FLEXIBLE SIGMOIDOSCOPY;  Surgeon: Inda Castle, MD;  [facility];  Service: Endoscopy;  Laterality: N/A;  . FOOT SURGERY     bilat, heel spurs- screw in R foot   . HEMORRHOID SURGERY  10/08/2011   Procedure: HEMORRHOIDECTOMY;  Surgeon: Stark Klein, MD;  Location: [place];  Service: General;  Laterality: N/A;  External   . SPHINCTEROTOMY  10/08/2011   Procedure: Joan Mayans;  Surgeon: Stark Klein, MD;  [facility];  Service: General;  Laterality: N/A;    Family History  Problem Relation Age of Onset  . Cancer Mother     breast  . Heart attack Mother   . Heart disease Mother   . Cancer Other     breast, colon, lung  . Heart disease Other   . Anesthesia problems Neg Hx   . Emphysema Sister   . Cancer Sister     breast  . Asthma Sister   . Heart  disease Sister   . Rheum arthritis Sister   . Cancer Brother     colon    Allergies  Allergen Reactions  . Fish Oil Anaphylaxis  . Other Hives, Shortness Of Breath and Swelling    Allergic to cashew nuts and peanut oil.  Marland Kitchen Penicillins Anaphylaxis, Hives and Swelling    Has patient had a PCN reaction causing immediate rash, facial/tongue/throat swelling, SOB or lightheadedness with hypotension: Face swelling and hives started first, then swelling of the throat  Has patient had a PCN reaction causing severe rash involving mucus membranes or skin necrosis: Yes  Has patient had a PCN reaction that required hospitalization: No  Has patient had a PCN reaction occurring within the last 10 years: Yes  If all of the above answers are "NO", then may proceed with Cephalospor  . Pneumococcal Vaccines Nausea And Vomiting  . Aspirin Itching and Rash  . Bactrim [Sulfamethoxazole-Trimethoprim] Hives, Itching and Rash  .  Ciprofloxacin Hives, Itching and Rash  . Ibuprofen Rash  . Influenza Vaccines Hives  . Ivp Dye [Iodinated Diagnostic Agents] Hives, Itching and Rash    Gives benadryl to counteract symptoms  . Latex Rash  . Macrobid [Nitrofurantoin Monohydrate Macrocrystals] Hives  . Shellfish Allergy Hives    Patient also allergic to seafood    Current Outpatient Prescriptions on File Prior to Visit  Medication Sig Dispense Refill  . albuterol (PROVENTIL HFA;VENTOLIN HFA) 108 (90 Base) MCG/ACT inhaler Inhale 2 puffs into the lungs every 6 (six) hours as needed. 1 Inhaler 0  . ALPRAZolam (XANAX) 0.25 MG tablet Take 1 tablet (0.25 mg total) by mouth 3 (three) times daily as needed for anxiety or sleep. 30 tablet 5  . amLODipine (NORVASC) 10 MG tablet Take 1 tablet (10 mg total) by mouth daily. 90 tablet 3  . benzonatate (TESSALON PERLES) 100 MG capsule Take 1 capsule (100 mg total) by mouth 3 (three) times daily as needed. 20 capsule 0  . cyclobenzaprine (FLEXERIL) 10 MG tablet Take 1 tablet (10 mg total) by mouth 3 (three) times daily as needed. (Patient taking differently: Take 10 mg by mouth 3 (three) times daily as needed for muscle spasms. ) 90 tablet 1  . escitalopram (LEXAPRO) 5 MG tablet Take 1 tablet (5 mg total) by mouth daily. 30 tablet 0  . Fluticasone-Salmeterol (ADVAIR) 250-50 MCG/DOSE AEPB Inhale 1 puff into the lungs every 12 (twelve) hours. 60 each 5  . furosemide (LASIX) 40 MG tablet Take 1 tablet (40 mg total) by mouth daily. 30 tablet 0  . HYDROcodone-acetaminophen (NORCO) 10-325 MG tablet Take 1 tablet by mouth every 6 (six) hours as needed. 90 tablet 0  . lactulose (CHRONULAC) 10 GM/15ML solution Take 30 mLs (20 g total) by mouth 3 (three) times daily as needed for mild constipation. 473 mL 1  . linaclotide (LINZESS) 290 MCG CAPS capsule Take 1 capsule (290 mcg total) by mouth daily. 30 capsule 0  . losartan (COZAAR) 50 MG tablet Take 1 tablet (50 mg total) by mouth daily. 30 tablet 2  .  lovastatin (MEVACOR) 40 MG tablet Take 2 tablets (80 mg total) by mouth at bedtime. 180 tablet 1  . meloxicam (MOBIC) 15 MG tablet Take 1 tablet (15 mg total) by mouth daily. 90 tablet 1  . metoprolol (LOPRESSOR) 50 MG tablet Take 1 tablet (50 mg total) by mouth 2 (two) times daily. 60 tablet 0  . omeprazole (PRILOSEC) 20 MG capsule TAKE TWO CAPSULES BY MOUTH ONCE DAILY  60 capsule 5   No current facility-administered medications on file prior to visit.     BP 130/82 (BP Location: Left Arm, Patient Position: Sitting, Cuff Size: Large)   Pulse (!) 49   Temp 98 F (36.7 C) (Oral)   Resp 20   Ht 5\' 5"  (1.651 m)   Wt 210 lb 8 oz (95.5 kg)   SpO2 97%   BMI 35.03 kg/m        Review of Systems  Constitutional: Negative.   HENT: Negative for congestion, dental problem, hearing loss, rhinorrhea, sinus pressure, sore throat and tinnitus.   Eyes: Negative for pain, discharge and visual disturbance.  Respiratory: Negative for cough and shortness of breath.   Cardiovascular: Negative for chest pain, palpitations and leg swelling.  Gastrointestinal: Negative for abdominal distention, abdominal pain, blood in stool, constipation, diarrhea, nausea and vomiting.  Genitourinary: Negative for difficulty urinating, dysuria, flank pain, frequency, hematuria, pelvic pain, urgency, vaginal bleeding, vaginal discharge and vaginal pain.  Musculoskeletal: Positive for back pain. Negative for arthralgias, gait problem and joint swelling.  Skin: Negative for rash.  Neurological: Negative for dizziness, syncope, speech difficulty, weakness, numbness and headaches.  Hematological: Negative for adenopathy.  Psychiatric/Behavioral: Negative for agitation, behavioral problems and dysphoric mood. The patient is not nervous/anxious.        Objective:   Physical Exam  Constitutional: She is oriented to person, place, and time. She appears well-developed and well-nourished.  HENT:  Head: Normocephalic.  [name]  Ear: External ear normal.  Left Ear: External ear normal.  Mouth/Throat: Oropharynx is clear and moist.  Eyes: Conjunctivae and EOM are normal. Pupils are equal, round, and reactive to light.  Neck: Normal range of motion. Neck supple. No thyromegaly present.  Cardiovascular: Normal rate, regular rhythm, normal heart sounds and intact distal pulses.   Pulmonary/Chest: Effort normal and breath sounds normal.  Abdominal: Soft. Bowel sounds are normal. She exhibits no mass. There is no tenderness.  Musculoskeletal: Normal range of motion.  Lymphadenopathy:    She has no cervical adenopathy.  Neurological: She is alert and oriented to person, place, and time.  Skin: Skin is warm and dry. No rash noted.  Psychiatric: She has a normal mood and affect. Her behavior is normal.          Assessment & Plan:  Essential hypertension, controlled Chronic low back pain.  Followed chronic pain management Degenerative joint disease OSA History of asthma, stable  Medications updated Patient states that she is allergic to both flu vaccine and Pneumovax  CPX 6 months  Nyoka Cowden

## 2015-12-19 ENCOUNTER — Telehealth: Payer: Self-pay | Admitting: Internal Medicine

## 2015-12-19 NOTE — Telephone Encounter (Signed)
Pt need new Rx for Hydrocodone  Pt is aware of Dr. Raliegh Ip is out of the office and will not return until 12/30/15.

## 2015-12-24 NOTE — Telephone Encounter (Signed)
Print on Monday.

## 2015-12-30 MED ORDER — HYDROCODONE-ACETAMINOPHEN 10-325 MG PO TABS: 1.0000 | ORAL_TABLET | Freq: Four times a day (QID) | ORAL | 0 refills | Status: DC | PRN

## 2015-12-30 NOTE — Addendum Note (Signed)
Addended by: Marian Sorrow on: 12/30/2015 10:01 AM   Modules accepted: Orders

## 2015-12-30 NOTE — Telephone Encounter (Signed)
Tried to contact pt, unable to leave message mailbox is full. Rx ready for pickup is at the front desk. Rx printed and signed.

## 2015-12-31 NOTE — Telephone Encounter (Signed)
Pt notified Rx ready for pickup. Rx printed and signed.  

## 2016-01-29 ENCOUNTER — Telehealth: Payer: Self-pay | Admitting: Internal Medicine

## 2016-01-29 NOTE — Telephone Encounter (Signed)
° °  Pt request refill of the following:   amLODipine (NORVASC) 10 MG tablet  HYDROcodone-acetaminophen (NORCO) 10-325 MG tablet   benzonatate (TESSALON PERLES) 100 MG capsule    losartan (COZAAR) 50 MG tablet     Phamacy:   [facility] [address]

## 2016-01-30 MED ORDER — LOSARTAN POTASSIUM 50 MG PO TABS: 50.0000 mg | ORAL_TABLET | Freq: Every day | ORAL | 2 refills | Status: DC

## 2016-01-30 MED ORDER — BENZONATATE 100 MG PO CAPS
100.0000 mg | ORAL_CAPSULE | Freq: Three times a day (TID) | ORAL | 0 refills | Status: DC | PRN
Start: 2016-01-30 — End: 2016-02-18

## 2016-01-30 MED ORDER — HYDROCODONE-ACETAMINOPHEN 10-325 MG PO TABS: 1.0000 | ORAL_TABLET | Freq: Four times a day (QID) | ORAL | 0 refills | Status: DC | PRN

## 2016-01-30 MED ORDER — AMLODIPINE BESYLATE 10 MG PO TABS: 10.0000 mg | ORAL_TABLET | Freq: Every day | ORAL | 1 refills | Status: DC

## 2016-01-30 NOTE — Telephone Encounter (Signed)
Left message on voicemail Rx for pain medication ready for pickup. Rest of the Rx's sent to pharmacy. Rx printed and signed by Dr. Sarajane Jews.

## 2016-02-17 ENCOUNTER — Telehealth: Payer: Self-pay | Admitting: Internal Medicine

## 2016-02-17 NOTE — Telephone Encounter (Signed)
Noted  

## 2016-02-17 NOTE — Telephone Encounter (Signed)
Patient Name: Miranda [name]  DOB: 08/23/1945    Initial Comment Caller states bp has been up, right now down to 183/91. Also has headache   Nurse Assessment  Nurse: Leilani Merl, RN, Heather Date/Time (Eastern Time): 02/17/2016 11:33:23 AM  Confirm and document reason for call. If symptomatic, describe symptoms. ---Caller states that she took her BP this morning before she took her medication and it was 186/91, and she had a headache. Since her headache feels a lot better, but she is not able to take her BP now she is at work.  Does the patient have any new or worsening symptoms? ---Yes  Will a triage be completed? ---Yes  Related visit to physician within the last 2 weeks? ---No  Does the PT have any chronic conditions? (i.e. diabetes, asthma, etc.) ---Yes  List chronic conditions. ---See MR  Is this a behavioral health or substance abuse call? ---No     Guidelines    Guideline Title Affirmed Question Affirmed Notes  High Blood Pressure BP ? 180/110    Final Disposition User   See Physician within 24 Hours Stand[place], RN, Water quality scientist    Comments  Appt made for 7:45 am with Dr. Maudie Mercury tomorrow morning.   Referrals  REFERRED TO PCP OFFICE   Disagree/Comply: Comply

## 2016-02-17 NOTE — Progress Notes (Signed)
HPI:  Acute visit for elevated blood pressure: -prior dx HTN on metoprolol 50mg , losartan 50mg ,lasix 40mg , amlodipine 10mg  -reports: has noticed BP running a little high the last few days - 140-170s/ 70-80s, one reading 190/90 -on review of medication list, realized she ran out of lasix about 1 month ago -she didn't know what her lovastatin is for so was not taking it -denies: CP, SOB, DOE, palpitations, vision changes -did get anxious about elevated blood pressure, felt dizzy once, has had mild headache -blood pressure elevated on several occassions at [facility] with PCP over the last 1 year -hx poor diet, no reg exercise, obesity, anxiety, chronic pain  ROS: See pertinent positives and negatives per HPI.  Past Medical History:  Diagnosis Date  . Anxiety   . Asthma   . CHF (congestive heart failure) ([place])    pt. unsure- but thinks she was hosp. for CHF- 2002  . [facility]    [facility]  . Colon polyps   . Complication of anesthesia    states requires a lot med. to put her to sleep   . DDD (degenerative disc disease) 09/17/2011  . Depression    "sometimes "  . Family history of anesthesia complication   . GERD (gastroesophageal reflux disease)   . Glaucoma    bilateral, pt. admits that she is noncompliant to eye gtts.   . Hemorrhoids   . Hyperlipidemia   . Hypertension    had stress, echo- 2006 /w [place], Cardiac Cath, per pt. 2002, echo repeated 2012- wnl   . Low back pain   . Shortness of breath   . Sleep apnea    uses c-pap- q night recently    Past Surgical History:  Procedure Laterality Date  . ABDOMINAL HYSTERECTOMY     ectopic, fibroids  . CARDIAC CATHETERIZATION    . COLONOSCOPY     remote  . EYE SURGERY     cataracts removed bilateral- ?IOL  . FISSURECTOMY  10/08/2011   Procedure: FISSURECTOMY;  Surgeon: Stark Klein, MD;  Location: [facility];  Service: General;  Laterality: N/A;  . FLEXIBLE SIGMOIDOSCOPY  02/25/2011   Procedure: FLEXIBLE  SIGMOIDOSCOPY;  Surgeon: Inda Castle, MD;  Location: [facility];  Service: Endoscopy;  Laterality: N/A;  . FOOT SURGERY     bilat, heel spurs- screw in R foot   . HEMORRHOID SURGERY  10/08/2011   Procedure: HEMORRHOIDECTOMY;  Surgeon: Stark Klein, MD;  Location: [facility];  Service: General;  Laterality: N/A;  External   . SPHINCTEROTOMY  10/08/2011   Procedure: Joan Mayans;  Surgeon: Stark Klein, MD;  Location: [facility];  Service: General;  Laterality: N/A;    Family History  Problem Relation Age of Onset  . Cancer Mother     breast  . Heart attack Mother   . Heart disease Mother   . Cancer Other     breast, colon, lung  . Heart disease Other   . Anesthesia problems Neg Hx   . Emphysema Sister   . Cancer Sister     breast  . Asthma Sister   . Heart disease Sister   . Rheum arthritis Sister   . Cancer Brother     colon    Social History   Social History  . Marital status: Married    Spouse name: N/A  . Number of children: N/A  . Years of education: N/A   Occupational History  . retired Quarry manager Unemployed   Social History Main Topics  .  Smoking status: Never Smoker  . Smokeless tobacco: Never Used  . Alcohol use No  . Drug use: No  . Sexual activity: No   Other Topics Concern  . None   Social History Narrative  . None     Current Outpatient Prescriptions:  .  albuterol (PROVENTIL HFA;VENTOLIN HFA) 108 (90 Base) MCG/ACT inhaler, Inhale 2 puffs into the lungs every 6 (six) hours as needed., Disp: 1 Inhaler, Rfl: 0 .  ALPRAZolam (XANAX) 0.25 MG tablet, Take 1 tablet (0.25 mg total) by mouth 3 (three) times daily as needed for anxiety or sleep., Disp: 30 tablet, Rfl: 5 .  amLODipine (NORVASC) 10 MG tablet, Take 1 tablet (10 mg total) by mouth daily., Disp: 90 tablet, Rfl: 1 .  cyclobenzaprine (FLEXERIL) 10 MG tablet, Take 1 tablet (10 mg total) by mouth 3 (three) times daily as needed. (Patient taking differently: Take 10 mg by mouth 3 (three) times daily as  needed for muscle spasms. ), Disp: 90 tablet, Rfl: 1 .  escitalopram (LEXAPRO) 5 MG tablet, Take 1 tablet (5 mg total) by mouth daily., Disp: 30 tablet, Rfl: 0 .  Fluticasone-Salmeterol (ADVAIR) 250-50 MCG/DOSE AEPB, Inhale 1 puff into the lungs every 12 (twelve) hours., Disp: 60 each, Rfl: 5 .  furosemide (LASIX) 40 MG tablet, Take 0.5 tablets (20 mg total) by mouth daily., Disp: 30 tablet, Rfl: 0 .  HYDROcodone-acetaminophen (NORCO) 10-325 MG tablet, Take 1 tablet by mouth every 6 (six) hours as needed., Disp: 90 tablet, Rfl: 0 .  lactulose (CHRONULAC) 10 GM/15ML solution, Take 30 mLs (20 g total) by mouth 3 (three) times daily as needed for mild constipation., Disp: 473 mL, Rfl: 1 .  linaclotide (LINZESS) 290 MCG CAPS capsule, Take 1 capsule (290 mcg total) by mouth daily., Disp: 30 capsule, Rfl: 0 .  losartan (COZAAR) 50 MG tablet, Take 1 tablet (50 mg total) by mouth daily., Disp: 30 tablet, Rfl: 2 .  lovastatin (MEVACOR) 40 MG tablet, Take 2 tablets (80 mg total) by mouth at bedtime., Disp: 180 tablet, Rfl: 1 .  metoprolol (LOPRESSOR) 50 MG tablet, Take 1 tablet (50 mg total) by mouth 2 (two) times daily., Disp: 60 tablet, Rfl: 0  EXAM:  Vitals:   02/18/16 0750 02/18/16 0814  BP: 138/78 (!) 164/88  Pulse: 60   Temp: 98.3 F (36.8 C)     Body mass index is 34.85 kg/m.  GENERAL: vitals reviewed and listed above, alert, oriented, appears well hydrated and in no acute distress  HEENT: atraumatic, conjunttiva clear, PERRLA, no facial asymmetry,  no obvious abnormalities on inspection of external nose and ears  NECK: no obvious masses on inspection  LUNGS: clear to auscultation bilaterally, no wheezes, rales or rhonchi, good air movement  CV: HRRR, tr peripheral edema  MS: moves all extremities without noticeable abnormality  PSYCH/NEURO: pleasant and cooperative, no obvious depression or anxiety, CN II-XII grossly intact, gait normal, speech and thought processing grossly  intact  ASSESSMENT AND PLAN:  Discussed the following assessment and plan:  Essential hypertension  Dyslipidemia  BMI 34.0-34.9,adult  -She is very anxious about her blood pressure, readings varied today with blood pressure okay on check-in, a little high when I rechecked it -We talked about the proper way to check her blood pressure at home -We went over her medication list carefully and found out that she had not been taking her Lasix for the last month, this may be why her blood pressure has been a little higher -She  also is not taking her cholesterol medicine -We will restart Lasix at a slightly lower dose (is worried about potassium is on Lasix) and have her follow-up with her primary doctor, she also agreed to start taking her cholesterol medication -We talked about over-the-counter medications and lifestyle habits the can increase/decrease blood pressure -Advised a healthy diet and regular exercise -Patient advised to return or notify a doctor immediately if symptoms worsen or persist or new concerns arise.  Patient Instructions  BEFORE YOU LEAVE: -follow up: with your doctor in 1 month  Please start taking your lasix again - 20mg  daily (1/2 tablet).  If you monitor your blood pressure at home please check after sitting for 5 minutes, feet flat on the floor. Take 3 readings and take average reading.   Bring your cuff and your log to the next appointment.  Eat a healthy diet with lots of fresh or frozen fruits and vegetables, chicken breasts, fish, beans, nuts and seeds and whole grains.  Get regular exercise.    Colin Benton R., DO

## 2016-02-18 ENCOUNTER — Ambulatory Visit (INDEPENDENT_AMBULATORY_CARE_PROVIDER_SITE_OTHER): Payer: Medicare Other | Admitting: Family Medicine

## 2016-02-18 ENCOUNTER — Encounter: Payer: Self-pay | Admitting: Family Medicine

## 2016-02-18 VITALS — BP 164/88 | HR 60 | Temp 98.3°F | Ht 65.0 in | Wt 209.4 lb

## 2016-02-18 DIAGNOSIS — I10 Essential (primary) hypertension: Secondary | ICD-10-CM | POA: Diagnosis not present

## 2016-02-18 DIAGNOSIS — E785 Hyperlipidemia, unspecified: Secondary | ICD-10-CM | POA: Diagnosis not present

## 2016-02-18 DIAGNOSIS — Z6834 Body mass index (BMI) 34.0-34.9, adult: Secondary | ICD-10-CM | POA: Diagnosis not present

## 2016-02-18 MED ORDER — FUROSEMIDE 40 MG PO TABS: 20.0000 mg | ORAL_TABLET | Freq: Every day | ORAL | 0 refills | Status: DC

## 2016-02-18 NOTE — Patient Instructions (Addendum)
BEFORE YOU LEAVE: -follow up: with your doctor in 1 month  Please start taking your lasix again - 20mg  daily (1/2 tablet).  If you monitor your blood pressure at home please check after sitting for 5 minutes, feet flat on the floor. Take 3 readings and take average reading.   Bring your cuff and your log to the next appointment.  Eat a healthy diet with lots of fresh or frozen fruits and vegetables, chicken breasts, fish, beans, nuts and seeds and whole grains.  Get regular exercise.

## 2016-02-18 NOTE — Progress Notes (Signed)
Pre visit review using our clinic review tool, if applicable. No additional management support is needed unless otherwise documented below in the visit note. 

## 2016-02-27 ENCOUNTER — Telehealth: Payer: Self-pay | Admitting: Internal Medicine

## 2016-02-27 ENCOUNTER — Other Ambulatory Visit: Payer: Self-pay | Admitting: Internal Medicine

## 2016-02-27 NOTE — Telephone Encounter (Signed)
Pt request refill  HYDROcodone-acetaminophen (NORCO) 10-325 MG tablet   Also request:  metoprolol (LOPRESSOR) 50 MG tablet benzonatate (TESSALON PERLES) 100 MG capsule       (Pt would like this refilled due to ongoing issue of wheezing) ALPRAZolam (XANAX) 0.25 MG tablet losartan (COZAAR) 50 MG tablet (pharm did not have the refill on it) furosemide (LASIX) 40 MG tablet  [facility]/ [address]

## 2016-02-28 ENCOUNTER — Other Ambulatory Visit: Payer: Self-pay | Admitting: Internal Medicine

## 2016-02-28 MED ORDER — BENZONATATE 100 MG PO CAPS: 100.0000 mg | ORAL_CAPSULE | Freq: Three times a day (TID) | ORAL | 0 refills | Status: DC | PRN

## 2016-02-28 MED ORDER — ALPRAZOLAM 0.25 MG PO TABS: 0.2500 mg | ORAL_TABLET | Freq: Three times a day (TID) | ORAL | 5 refills | Status: DC | PRN

## 2016-02-28 MED ORDER — LOSARTAN POTASSIUM 50 MG PO TABS: 50.0000 mg | ORAL_TABLET | Freq: Every day | ORAL | 2 refills | Status: DC

## 2016-02-28 MED ORDER — HYDROCODONE-ACETAMINOPHEN 10-325 MG PO TABS: 1.0000 | ORAL_TABLET | Freq: Four times a day (QID) | ORAL | 0 refills | Status: DC | PRN

## 2016-02-28 MED ORDER — FUROSEMIDE 40 MG PO TABS: 20.0000 mg | ORAL_TABLET | Freq: Every day | ORAL | 0 refills | Status: DC

## 2016-02-28 MED ORDER — METOPROLOL TARTRATE 50 MG PO TABS: 50.0000 mg | ORAL_TABLET | Freq: Two times a day (BID) | ORAL | 0 refills | Status: DC

## 2016-03-20 ENCOUNTER — Ambulatory Visit: Payer: Medicare Other | Admitting: Internal Medicine

## 2016-03-23 ENCOUNTER — Telehealth: Payer: Self-pay | Admitting: Internal Medicine

## 2016-03-23 ENCOUNTER — Other Ambulatory Visit: Payer: Self-pay | Admitting: Internal Medicine

## 2016-03-23 MED ORDER — HYDROCODONE-ACETAMINOPHEN 10-325 MG PO TABS: 1.0000 | ORAL_TABLET | Freq: Four times a day (QID) | ORAL | 0 refills | Status: DC | PRN

## 2016-03-23 NOTE — Telephone Encounter (Signed)
Okay for early refill Emphasized to patient that 90 pills must last 1 month

## 2016-03-23 NOTE — Telephone Encounter (Signed)
Pt notified Rx ready for pickup. Rx printed and signed. Writer educated pt  On the importance for this Rx to last for one month. Pt verbalized understanding.

## 2016-03-23 NOTE — Telephone Encounter (Signed)
Pt request refill  HYDROcodone-acetaminophen (NORCO) 10-325 MG tablet  Pt states the cold weather has caused her to hurt more, and wants to know if Dr Raliegh Ip will refill a week early? Pt has been taking every 4 hours. Due 03/30/16

## 2016-04-17 ENCOUNTER — Emergency Department (HOSPITAL_COMMUNITY): Payer: Medicare Other

## 2016-04-17 ENCOUNTER — Encounter (HOSPITAL_COMMUNITY): Payer: Self-pay | Admitting: *Deleted

## 2016-04-17 ENCOUNTER — Emergency Department (HOSPITAL_COMMUNITY)
Admission: EM | Admit: 2016-04-17 | Discharge: 2016-04-17 | Discharge status: Against Medical Advice | Payer: Medicare Other | Attending: Emergency Medicine | Admitting: Emergency Medicine

## 2016-04-17 DIAGNOSIS — J45909 Unspecified asthma, uncomplicated: Secondary | ICD-10-CM | POA: Diagnosis not present

## 2016-04-17 DIAGNOSIS — N189 Chronic kidney disease, unspecified: Secondary | ICD-10-CM | POA: Insufficient documentation

## 2016-04-17 DIAGNOSIS — Z79899 Other long term (current) drug therapy: Secondary | ICD-10-CM | POA: Insufficient documentation

## 2016-04-17 DIAGNOSIS — R0602 Shortness of breath: Secondary | ICD-10-CM | POA: Insufficient documentation

## 2016-04-17 DIAGNOSIS — I130 Hypertensive heart and chronic kidney disease with heart failure and stage 1 through stage 4 chronic kidney disease, or unspecified chronic kidney disease: Secondary | ICD-10-CM | POA: Diagnosis not present

## 2016-04-17 DIAGNOSIS — I509 Heart failure, unspecified: Secondary | ICD-10-CM | POA: Insufficient documentation

## 2016-04-17 DIAGNOSIS — Z91040 Latex allergy status: Secondary | ICD-10-CM | POA: Insufficient documentation

## 2016-04-17 LAB — CBC WITH DIFFERENTIAL/PLATELET
Basophils Absolute: 0 10*3/uL (ref 0.0–0.1)
Basophils Relative: 0 %
Eosinophils Absolute: 0.2 10*3/uL (ref 0.0–0.7)
Eosinophils Relative: 3 %
HEMATOCRIT: 41.4 % (ref 36.0–46.0)
HEMOGLOBIN: 13.4 g/dL (ref 12.0–15.0)
LYMPHS ABS: 3.2 10*3/uL (ref 0.7–4.0)
LYMPHS PCT: 47 %
MCH: 27.7 pg (ref 26.0–34.0)
MCHC: 32.4 g/dL (ref 30.0–36.0)
MCV: 85.7 fL (ref 78.0–100.0)
MONOS PCT: 11 %
Monocytes Absolute: 0.7 10*3/uL (ref 0.1–1.0)
NEUTROS ABS: 2.7 10*3/uL (ref 1.7–7.7)
NEUTROS PCT: 39 %
Platelets: 198 10*3/uL (ref 150–400)
RBC: 4.83 MIL/uL (ref 3.87–5.11)
RDW: 14.1 % (ref 11.5–15.5)
WBC: 6.8 10*3/uL (ref 4.0–10.5)

## 2016-04-17 LAB — BASIC METABOLIC PANEL
Anion gap: 10 (ref 5–15)
BUN: 10 mg/dL (ref 6–20)
CHLORIDE: 104 mmol/L (ref 101–111)
CO2: 26 mmol/L (ref 22–32)
Calcium: 9.8 mg/dL (ref 8.9–10.3)
Creatinine, Ser: 0.75 mg/dL (ref 0.44–1.00)
GFR calc Af Amer: >60 mL/min (ref >60)
GFR calc non Af Amer: >60 mL/min (ref >60)
GLUCOSE: 121 mg/dL — AB (ref 65–99)
POTASSIUM: 3.5 mmol/L (ref 3.5–5.1)
Sodium: 140 mmol/L (ref 135–145)

## 2016-04-17 LAB — I-STAT TROPONIN, ED: TROPONIN I, POC: 0 ng/mL (ref 0.00–0.08)

## 2016-04-17 MED ORDER — ALBUTEROL SULFATE (2.5 MG/3ML) 0.083% IN NEBU
5.0000 mg | INHALATION_SOLUTION | Freq: Once | RESPIRATORY_TRACT | Status: AC
  Administered 2016-04-17: 5 mg via RESPIRATORY_TRACT
  Filled 2016-04-17: qty 6

## 2016-04-17 NOTE — ED Notes (Signed)
Pt stating she is ready to go. States breathing tx helped and breathing is much improved. Pt informed we are still waiting on lab results at this time.

## 2016-04-17 NOTE — ED Triage Notes (Signed)
Pt reports ongoing sob but was coming here today and had forgotten her inhaler. Hx of asthma. Reports sob occurs more with exertion. No distress noted at triage, spo2 100%.

## 2016-04-17 NOTE — ED Provider Notes (Signed)
[facility] DEPT Provider Note   CSN: [id number] Arrival date & time: 04/17/16  1158     History   Chief Complaint Chief Complaint  Patient presents with  . Shortness of Breath  . Asthma    HPI MANASA LAROCHE is a 71 y.o. female.  The history is provided by the patient.  89yoF with hx of HTN, HLD, sleep apnea, asthma presenting with SOB. She states she came to the ED this morning for her daughter and noted that when they left and walked down the hall, she became very short of breath. She states she is unable to walk a long distance due to SOB. She states that she thinks it is because of her asthma and she didn't have her inhaler with her, which is why she checked herself in. She states that when she becomes short of breath she usually will try her inhaler however her shortness of breath does not resolve immediately. Her shortness of breath does resolve once she sits down and rests. She states that she has occasionally had mild chest tightness associated with this but this is not all the time. She states that this dyspnea on exertion and chest tightness has been intermittent but gradually worsening over the past 2 months. She denies any fevers, cough, sputum production, Abd pain, N/V, diaphoresis. She states that her mother died at the age of 48 from an [place]. She has also had 2 sister died from [place] and another sister has a pacemaker.   Past Medical History:  Diagnosis Date  . Anxiety   . Asthma   . CHF (congestive heart failure) ([place])    pt. unsure- but thinks she was hosp. for CHF- 2002  . [facility]    [facility]  . Colon polyps   . Complication of anesthesia    states requires a lot med. to put her to sleep   . DDD (degenerative disc disease) 09/17/2011  . Depression    "sometimes "  . Family history of anesthesia complication   . GERD (gastroesophageal reflux disease)   . Glaucoma    bilateral, pt. admits that she is noncompliant to eye gtts.   .  Hemorrhoids   . Hyperlipidemia   . Hypertension    had stress, echo- 2006 /w [place], Cardiac Cath, per pt. 2002, echo repeated 2012- wnl   . Low back pain   . Shortness of breath   . Sleep apnea    uses c-pap- q night recently    Patient Active Problem List   Diagnosis Date Noted  . Coronary artery calcification seen on CAT scan 08/06/2015  . Abnormal CT scan, chest 04/05/2015  . DDD (degenerative disc disease) 09/17/2011  . Pyelonephritis with possible nonobstructing 5 mm calculus by renal ultrasound 09/16/2011  . OSA (obstructive sleep apnea) 06/10/2011  . Anal fissure 02/17/2011  . History of colonic polyps 04/03/2010  . INSOMNIA 09/28/2007  . Asthma 11/22/2006  . Dyslipidemia 08/19/2006  . Essential hypertension 08/19/2006  . GERD 08/19/2006  . LOW BACK PAIN 08/19/2006    Past Surgical History:  Procedure Laterality Date  . ABDOMINAL HYSTERECTOMY     ectopic, fibroids  . CARDIAC CATHETERIZATION    . COLONOSCOPY     remote  . EYE SURGERY     cataracts removed bilateral- ?IOL  . FISSURECTOMY  10/08/2011   Procedure: FISSURECTOMY;  Surgeon: Stark Klein, MD;  Location: [facility];  Service: General;  Laterality: N/A;  . FLEXIBLE SIGMOIDOSCOPY  02/25/2011  Procedure: FLEXIBLE SIGMOIDOSCOPY;  Surgeon: Inda Castle, MD;  Location: [facility];  Service: Endoscopy;  Laterality: N/A;  . FOOT SURGERY     bilat, heel spurs- screw in R foot   . HEMORRHOID SURGERY  10/08/2011   Procedure: HEMORRHOIDECTOMY;  Surgeon: Stark Klein, MD;  Location: [facility];  Service: General;  Laterality: N/A;  External   . SPHINCTEROTOMY  10/08/2011   Procedure: Joan Mayans;  Surgeon: Stark Klein, MD;  Location: [facility];  Service: General;  Laterality: N/A;    OB History    No data available       Home Medications    Prior to Admission medications   Medication Sig Start Date End Date Taking? Authorizing Provider  albuterol (PROVENTIL HFA;VENTOLIN HFA) 108 (90 Base) MCG/ACT inhaler Inhale  2 puffs into the lungs every 6 (six) hours as needed. 10/14/15 10/13/16  Marletta Lor, MD  ALPRAZolam Duanne Moron) 0.25 MG tablet Take 1 tablet (0.25 mg total) by mouth 3 (three) times daily as needed for anxiety or sleep. 02/28/16   Marletta Lor, MD  amLODipine (NORVASC) 10 MG tablet Take 1 tablet (10 mg total) by mouth daily. 01/30/16   Marletta Lor, MD  benzonatate (TESSALON) 100 MG capsule Take 1 capsule (100 mg total) by mouth 3 (three) times daily as needed. 02/28/16   Marletta Lor, MD  cyclobenzaprine (FLEXERIL) 10 MG tablet Take 1 tablet (10 mg total) by mouth 3 (three) times daily as needed. Patient taking differently: Take 10 mg by mouth 3 (three) times daily as needed for muscle spasms.  03/07/15   Marletta Lor, MD  escitalopram (LEXAPRO) 5 MG tablet Take 1 tablet (5 mg total) by mouth daily. 10/14/15   Marletta Lor, MD  Fluticasone-Salmeterol (ADVAIR) 250-50 MCG/DOSE AEPB Inhale 1 puff into the lungs every 12 (twelve) hours. 11/28/15   Marletta Lor, MD  furosemide (LASIX) 40 MG tablet Take 0.5 tablets (20 mg total) by mouth daily. 02/28/16   Marletta Lor, MD  HYDROcodone-acetaminophen [facility]) 10-325 MG tablet Take 1 tablet by mouth every 6 (six) hours as needed. 03/23/16   Marletta Lor, MD  lactulose (CHRONULAC) 10 GM/15ML solution Take 30 mLs (20 g total) by mouth 3 (three) times daily as needed for mild constipation. 03/08/15   Marletta Lor, MD  linaclotide [facility]) 290 MCG CAPS capsule Take 1 capsule (290 mcg total) by mouth daily. 10/14/15   Marletta Lor, MD  losartan (COZAAR) 50 MG tablet Take 1 tablet (50 mg total) by mouth daily. 02/28/16   Marletta Lor, MD  lovastatin (MEVACOR) 40 MG tablet Take 2 tablets (80 mg total) by mouth at bedtime. 11/28/15   Marletta Lor, MD  metoprolol (LOPRESSOR) 50 MG tablet Take 1 tablet (50 mg total) by mouth 2 (two) times daily. 02/28/16   Marletta Lor, MD     Family History Family History  Problem Relation Age of Onset  . Cancer Mother     breast  . Heart attack Mother   . Heart disease Mother   . Cancer Other     breast, colon, lung  . Heart disease Other   . Emphysema Sister   . Cancer Sister     breast  . Asthma Sister   . Heart disease Sister   . Rheum arthritis Sister   . Cancer Brother     colon  . Anesthesia problems Neg Hx     Social History Social History  Substance Use Topics  . Smoking status: Never Smoker  . Smokeless tobacco: Never Used  . Alcohol use No     Allergies   Fish oil; Other; Penicillins; Pneumococcal vaccines; Aspirin; Bactrim [sulfamethoxazole-trimethoprim]; Ciprofloxacin; Ibuprofen; Influenza vaccines; Ivp dye [iodinated diagnostic agents]; Latex; Macrobid [nitrofurantoin monohydrate macrocrystals]; and Shellfish allergy   Review of Systems Review of Systems  Constitutional: Negative for chills and fever.  HENT: Negative for ear pain and sore throat.   Eyes: Negative for pain and visual disturbance.  Respiratory: Positive for shortness of breath. Negative for cough.   Cardiovascular: Positive for chest pain. Negative for palpitations.  Gastrointestinal: Negative for abdominal pain and vomiting.  Genitourinary: Negative for dysuria and hematuria.  Musculoskeletal: Negative for arthralgias and back pain.  Skin: Negative for color change and rash.  Neurological: Negative for seizures and syncope.  All other systems reviewed and are negative.    Physical Exam Updated Vital Signs BP 129/73 (BP Location: Left Arm)   Pulse 60   Temp 98.3 F (36.8 C) (Oral)   Resp 18   SpO2 100%   Physical Exam  Constitutional: She appears well-developed and well-nourished. No distress.  Obese  HENT:  Head: Normocephalic and atraumatic.  Eyes: Conjunctivae are normal.  Neck: Normal range of motion. Neck supple.  Cardiovascular: Normal rate, regular rhythm, S1 normal, S2 normal, intact distal pulses  and normal pulses.   Murmur heard.  Systolic murmur is present with a grade of 5/6  Pulmonary/Chest: Effort normal and breath sounds normal. No respiratory distress. She has no decreased breath sounds. She has no wheezes. She has no rhonchi.  Abdominal: Soft. There is no tenderness.  Musculoskeletal: She exhibits no edema.  Neurological: She is alert.  Skin: Skin is warm and dry.  Psychiatric: She has a normal mood and affect.  Nursing note and vitals reviewed.    ED Treatments / Results  Labs (all labs ordered are listed, but only abnormal results are displayed) Labs Reviewed - No data to display  EKG  EKG Interpretation  Date/Time:  Friday April 17 2016 12:05:08 EST Ventricular Rate:  54 PR Interval:  134 QRS Duration: 70 QT Interval:  410 QTC Calculation: 388 R Axis:   41 Text Interpretation:  Sinus bradycardia Mild baseline artifact No acute ischemic changes Confirmed by Ellender Hose MD, [place] [phone]) on 04/17/2016 3:23:41 PM       Radiology Dg Chest 2 View  Result Date: 04/17/2016 CLINICAL DATA:  Shortness of breath EXAM: CHEST  2 VIEW COMPARISON:  CT chest 06/24/2015, chest x-ray 04/05/2015 FINDINGS: There is bilateral chronic interstitial thickening. There is no new focal parenchymal opacity. There is no focal parenchymal opacity. There is no pleural effusion or pneumothorax. The heart and mediastinal contours are unremarkable. The osseous structures are unremarkable. IMPRESSION: No active cardiopulmonary disease. Electronically Signed   By: Kathreen Devoid   On: 04/17/2016 12:41    Procedures Procedures (including critical care time)  Medications Ordered in ED Medications  albuterol (PROVENTIL) (2.5 MG/3ML) 0.083% nebulizer solution 5 mg (not administered)     Initial Impression / Assessment and Plan / ED Course  I have reviewed the triage vital signs and the nursing notes.  Pertinent labs & imaging results that were available during my care of the patient were  reviewed by me and considered in my medical decision making (see chart for details).    71 year old female with a history of hypertension hyperlipidemia presenting with worsening dyspnea on exertion for the past 2 months occasionally  associated with chest tightness. She has a significant family history of CAD. It is also noted that she recently had a CT chest high resolution noted to vessel coronary atherosclerosis LAD and left circumflex coronary arteries. She also had a stress test in 2006 showing ischemic changes in the anterolateral apex however myocardial perfusion study in 05/2014 reportedly normal. Concern for ACS.  CBC, BMP, Trop ordered. EKG with NSR with no ST or T wave changes. CXR grossly unremarkable. No evidence of PTX or PNA. No widened mediastinum and hx inconsistent with dissection.  Labs are reassuring and troponin is negative. HEART score 5. Due to the patient's heart score and cardiac risk factors I offered to admit the patient for observation and stress testing however the patient refused admission. She states that she does not want to stay. I discussed the risks of her leaving including worsening dyspnea on exertion, worsening chest pain, heart attack, heart failure, death. She aknowledged the risks and verbalizes them back to me. She states that she understands the risks and will also go home anyways. She states that she will call her PCP first thing in the morning for an outpatient follow-up and cardiac workup. She is given strict return precautions and instructed to return to the emergency department if she had any worsening symptoms or return of her shortness of breath or chest pain. She was discharged [place]  Patient care discussed and supervised by my attending, Dr. Ellender Hose. Drucie Ip, MD   Final Clinical Impressions(s) / ED Diagnoses   Final diagnoses:  Shortness of breath    New Prescriptions New Prescriptions   No medications on file     Tajae Maiolo Mali Tuleen Mandelbaum,  MD 04/17/16 1909    Duffy Bruce, MD 04/18/16 1323

## 2016-04-17 NOTE — Discharge Instructions (Signed)
You have many risk factors and concerning family history that puts you at high risk for a heart attack. You should call your PCP as soon as possible for re-evaluation and further cardiac workup. Please return if you continue to have shortness of breath, chest pain, nausea, vomiting, or sweating.

## 2016-04-20 ENCOUNTER — Encounter: Payer: Self-pay | Admitting: Internal Medicine

## 2016-04-20 ENCOUNTER — Ambulatory Visit (INDEPENDENT_AMBULATORY_CARE_PROVIDER_SITE_OTHER): Payer: Medicare Other | Admitting: Internal Medicine

## 2016-04-20 VITALS — BP 140/78 | HR 57 | Temp 98.8°F | Ht 65.0 in | Wt 207.2 lb

## 2016-04-20 DIAGNOSIS — I10 Essential (primary) hypertension: Secondary | ICD-10-CM

## 2016-04-20 DIAGNOSIS — M545 Low back pain, unspecified: Secondary | ICD-10-CM

## 2016-04-20 DIAGNOSIS — J4520 Mild intermittent asthma, uncomplicated: Secondary | ICD-10-CM

## 2016-04-20 DIAGNOSIS — I2510 Atherosclerotic heart disease of native coronary artery without angina pectoris: Secondary | ICD-10-CM

## 2016-04-20 MED ORDER — FUROSEMIDE 40 MG PO TABS: 20.0000 mg | ORAL_TABLET | Freq: Every day | ORAL | 0 refills | Status: DC

## 2016-04-20 MED ORDER — LINACLOTIDE 290 MCG PO CAPS: 290.0000 ug | ORAL_CAPSULE | Freq: Every day | ORAL | 0 refills | Status: DC

## 2016-04-20 MED ORDER — HYDROCODONE-ACETAMINOPHEN 10-325 MG PO TABS: 1.0000 | ORAL_TABLET | Freq: Four times a day (QID) | ORAL | 0 refills | Status: DC | PRN

## 2016-04-20 MED ORDER — AMLODIPINE BESYLATE 10 MG PO TABS: 10.0000 mg | ORAL_TABLET | Freq: Every day | ORAL | 1 refills | Status: DC

## 2016-04-20 MED ORDER — METOPROLOL TARTRATE 50 MG PO TABS: 50.0000 mg | ORAL_TABLET | Freq: Two times a day (BID) | ORAL | 0 refills | Status: DC

## 2016-04-20 MED ORDER — LOVASTATIN 40 MG PO TABS: 40.0000 mg | ORAL_TABLET | Freq: Every day | ORAL | 1 refills | Status: DC

## 2016-04-20 MED ORDER — LOSARTAN POTASSIUM 50 MG PO TABS: 50.0000 mg | ORAL_TABLET | Freq: Every day | ORAL | 2 refills | Status: DC

## 2016-04-20 NOTE — Progress Notes (Signed)
Subjective:    Patient ID: Miranda [name], female    DOB: [birth year], 71 y.o.   MRN: [id number]  HPI  Admit date: 03/18/2015 Discharge date: 03/23/2015  Discharge Diagnoses:  Principal Problem:   Acute respiratory failure with hypoxia ([place]) Active Problems:   Essential hypertension   Asthma   GERD   LOW BACK PAIN   CAP (community acquired pneumonia)   Anemia  71 year old patient who is seen today following a recent emergency room visit 3 days ago. She states that after a prolonged walk.  She became short of breath and had some mild palpitations.  She had some brief sharp chest pain with inspiration.  She was seen in the ED and chest x-ray was normal.  Initial troponin level was normal, but she left prior to a second blood draw.  Other laboratory studies were normal.  Over the weekend.  She has had no recurrent recurrent pain.  Her chief complaint today is low back pain.  She is requesting a slight early refill on Vicodin.  The patient had a negative perfusion stress test in March 2016.  This revealed an ejection fraction of 68%. She had pulmonary function studies.  Following a hospital discharge in March 2017.  This revealed some mild restrictive type lung defect only.  Denies any chest pain or shortness of breath today.  Only  complaint is back pain and requests for multiple medication refills  Past Medical History:  Diagnosis Date  . Anxiety   . Asthma   . CHF (congestive heart failure) ([place])    pt. unsure- but thinks she was hosp. for CHF- 2002  . [facility]    [facility]  . Colon polyps   . Complication of anesthesia    states requires a lot med. to put her to sleep   . DDD (degenerative disc disease) 09/17/2011  . Depression    "sometimes "  . Family history of anesthesia complication   . GERD (gastroesophageal reflux disease)   . Glaucoma    bilateral, pt. admits that she is noncompliant to eye gtts.   . Hemorrhoids   . Hyperlipidemia   .  Hypertension    had stress, echo- 2006 /w [place], Cardiac Cath, per pt. 2002, echo repeated 2012- wnl   . Low back pain   . Shortness of breath   . Sleep apnea    uses c-pap- q night recently     Social History   Social History  . Marital status: Married    Spouse name: N/A  . Number of children: N/A  . Years of education: N/A   Occupational History  . retired Quarry manager Unemployed   Social History Main Topics  . Smoking status: Never Smoker  . Smokeless tobacco: Never Used  . Alcohol use No  . Drug use: No  . Sexual activity: No   Other Topics Concern  . Not on file   Social History Narrative  . No narrative on file    Past Surgical History:  Procedure Laterality Date  . ABDOMINAL HYSTERECTOMY     ectopic, fibroids  . CARDIAC CATHETERIZATION    . COLONOSCOPY     remote  . EYE SURGERY     cataracts removed bilateral- ?IOL  . FISSURECTOMY  10/08/2011   Procedure: FISSURECTOMY;  Surgeon: Stark Klein, MD;  Location: [place];  Service: General;  Laterality: N/A;  . FLEXIBLE SIGMOIDOSCOPY  02/25/2011   Procedure: Beryle Quant;  Surgeon: Inda Castle, MD;  Location: [facility];  Service: Endoscopy;  Laterality: N/A;  . FOOT SURGERY     bilat, heel spurs- screw in R foot   . HEMORRHOID SURGERY  10/08/2011   Procedure: HEMORRHOIDECTOMY;  Surgeon: Stark Klein, MD;  Location: [place];  Service: General;  Laterality: N/A;  External   . SPHINCTEROTOMY  10/08/2011   Procedure: Joan Mayans;  Surgeon: Stark Klein, MD;  Location: [facility];  Service: General;  Laterality: N/A;    Family History  Problem Relation Age of Onset  . Cancer Mother     breast  . Heart attack Mother   . Heart disease Mother   . Cancer Other     breast, colon, lung  . Heart disease Other   . Emphysema Sister   . Cancer Sister     breast  . Asthma Sister   . Heart disease Sister   . Rheum arthritis Sister   . Cancer Brother     colon  . Anesthesia problems Neg Hx     Allergies    Allergen Reactions  . Fish Oil Anaphylaxis  . Other Hives, Shortness Of Breath and Swelling    Allergic to cashew nuts and peanut oil.  Marland Kitchen Penicillins Anaphylaxis, Hives and Swelling    Has patient had a PCN reaction causing immediate rash, facial/tongue/throat swelling, SOB or lightheadedness with hypotension: Face swelling and hives started first, then swelling of the throat  Has patient had a PCN reaction causing severe rash involving mucus membranes or skin necrosis: Yes  Has patient had a PCN reaction that required hospitalization: No  Has patient had a PCN reaction occurring within the last 10 years: Yes  If all of the above answers are "NO", then may proceed with Cephalospor  . Pneumococcal Vaccines Nausea And Vomiting  . Aspirin Itching and Rash  . Bactrim [Sulfamethoxazole-Trimethoprim] Hives, Itching and Rash  . Ciprofloxacin Hives, Itching and Rash  . Ibuprofen Rash  . Influenza Vaccines Hives  . Ivp Dye [Iodinated Diagnostic Agents] Hives, Itching and Rash    Gives benadryl to counteract symptoms  . Latex Rash  . Macrobid [Nitrofurantoin Monohydrate Macrocrystals] Hives  . Shellfish Allergy Hives    Patient also allergic to seafood    Current Outpatient Prescriptions on File Prior to Visit  Medication Sig Dispense Refill  . albuterol (PROVENTIL HFA;VENTOLIN HFA) 108 (90 Base) MCG/ACT inhaler Inhale 2 puffs into the lungs every 6 (six) hours as needed. 1 Inhaler 0  . ALPRAZolam (XANAX) 0.25 MG tablet Take 1 tablet (0.25 mg total) by mouth 3 (three) times daily as needed for anxiety or sleep. 30 tablet 5  . benzonatate (TESSALON) 100 MG capsule Take 1 capsule (100 mg total) by mouth 3 (three) times daily as needed. 20 capsule 0  . cyclobenzaprine (FLEXERIL) 10 MG tablet Take 1 tablet (10 mg total) by mouth 3 (three) times daily as needed. (Patient taking differently: Take 10 mg by mouth 3 (three) times daily as needed for muscle spasms. ) 90 tablet 1  . escitalopram (LEXAPRO)  5 MG tablet Take 1 tablet (5 mg total) by mouth daily. 30 tablet 0  . Fluticasone-Salmeterol (ADVAIR) 250-50 MCG/DOSE AEPB Inhale 1 puff into the lungs every 12 (twelve) hours. 60 each 5  . lactulose (CHRONULAC) 10 GM/15ML solution Take 30 mLs (20 g total) by mouth 3 (three) times daily as needed for mild constipation. 473 mL 1   No current facility-administered medications on file prior to visit.     BP 140/78 (BP  Location: [name] Arm, Patient Position: Sitting, Cuff Size: Normal)   Pulse (!) 57   Temp 98.8 F (37.1 C) (Oral)   Ht 5\' 5"  (1.651 m)   Wt 207 lb 3.2 oz (94 kg)   SpO2 95%   BMI 34.48 kg/m    Review of Systems  Constitutional: Negative.   HENT: Negative for congestion, dental problem, hearing loss, rhinorrhea, sinus pressure, sore throat and tinnitus.   Eyes: Negative for pain, discharge and visual disturbance.  Respiratory: Positive for shortness of breath. Negative for cough.   Cardiovascular: Positive for chest pain and palpitations. Negative for leg swelling.  Gastrointestinal: Negative for abdominal distention, abdominal pain, blood in stool, constipation, diarrhea, nausea and vomiting.  Genitourinary: Negative for difficulty urinating, dysuria, flank pain, frequency, hematuria, pelvic pain, urgency, vaginal bleeding, vaginal discharge and vaginal pain.  Musculoskeletal: Negative for arthralgias, gait problem and joint swelling.  Skin: Negative for rash.  Neurological: Negative for dizziness, syncope, speech difficulty, weakness, numbness and headaches.  Hematological: Negative for adenopathy.  Psychiatric/Behavioral: Negative for agitation, behavioral problems and dysphoric mood. The patient is not nervous/anxious.        Objective:   Physical Exam  Constitutional: She is oriented to person, place, and time. She appears well-developed and well-nourished.  , blood pressure 130/70 Pulse 60 O2 saturation 95%  HENT:  Head: Normocephalic.  [name] Ear: External ear  normal.  Left Ear: External ear normal.  Mouth/Throat: Oropharynx is clear and moist.  Eyes: Conjunctivae and EOM are normal. Pupils are equal, round, and reactive to light.  Neck: Normal range of motion. Neck supple. No thyromegaly present.  Cardiovascular: Normal rate, regular rhythm, normal heart sounds and intact distal pulses.   Pulmonary/Chest: Effort normal and breath sounds normal. No respiratory distress. She has no wheezes. She has no rales. She exhibits no tenderness.  No chest wall pain  Abdominal: Soft. Bowel sounds are normal. She exhibits no mass. There is no tenderness.  Musculoskeletal: Normal range of motion.  Lymphadenopathy:    She has no cervical adenopathy.  Neurological: She is alert and oriented to person, place, and time.  Skin: Skin is warm and dry. No rash noted.  Psychiatric: She has a normal mood and affect. Her behavior is normal.          Assessment & Plan:   History of DOE now resolved Atypical chest pain Chronic low back pain.  Patient was given a early refill of hydrocodone Chronic constipation.  Linzess refilled Essential hypertension, stable  No change in medical therapy CPX next month as scheduled  Nyoka Cowden

## 2016-04-20 NOTE — Progress Notes (Signed)
Pre visit review using our clinic review tool, if applicable. No additional management support is needed unless otherwise documented below in the visit note. 

## 2016-04-20 NOTE — Patient Instructions (Signed)
Limit your sodium (Salt) intake  Please check your blood pressure on a regular basis.  If it is consistently greater than 150/90, please make an office appointment.  Return next month as scheduled

## 2016-04-30 ENCOUNTER — Encounter: Payer: Self-pay | Admitting: Acute Care

## 2016-04-30 ENCOUNTER — Ambulatory Visit (INDEPENDENT_AMBULATORY_CARE_PROVIDER_SITE_OTHER): Payer: Medicare Other | Admitting: Acute Care

## 2016-04-30 ENCOUNTER — Other Ambulatory Visit: Payer: Medicare Other

## 2016-04-30 DIAGNOSIS — R938 Abnormal findings on diagnostic imaging of other specified body structures: Secondary | ICD-10-CM | POA: Diagnosis not present

## 2016-04-30 DIAGNOSIS — G4733 Obstructive sleep apnea (adult) (pediatric): Secondary | ICD-10-CM | POA: Diagnosis not present

## 2016-04-30 DIAGNOSIS — R0600 Dyspnea, unspecified: Secondary | ICD-10-CM | POA: Diagnosis not present

## 2016-04-30 DIAGNOSIS — R9389 Abnormal findings on diagnostic imaging of other specified body structures: Secondary | ICD-10-CM

## 2016-04-30 LAB — D-DIMER, QUANTITATIVE: D-Dimer, Quant: 3.17 mcg/mL FEU — ABNORMAL HIGH (ref <0.50)

## 2016-04-30 NOTE — Assessment & Plan Note (Addendum)
Abnormal CT chest 04/11/2016 Suspected resolving CAP, but crackles on exam concerning for pulmonary fibrosis Immunology suspicious for PF New onset dyspnea with exertions  x 2 months No complaints of pain/ no recent travel/ no leg edema or c/o leg pain. Plan: D-Dimer to assess for possible PE as acute increase in dyspnea x 2 weeks If D-Dimer +>> CTA If D-Dimer ->> HRCT to re-evaluate fibrosis/Repeat PFT's Follow up with Dr. [name] after imaging and PFT's result. Please contact office for sooner follow up if symptoms do not improve or worsen or seek emergency care

## 2016-04-30 NOTE — Patient Instructions (Addendum)
It is nice to meet you today. We will do labs today ( D dimer) We will call you with results We will schedule Pulmonary Function Test today. We will schedule HRCT Chest today Follow up appointment with Dr. [name] once imaging and PFT's completed. Please contact office for sooner follow up if symptoms do not improve or worsen or seek emergency care

## 2016-04-30 NOTE — Assessment & Plan Note (Signed)
No longer using CPAP. Plan: Consider re-initiation of therapy if patient desires.

## 2016-04-30 NOTE — Progress Notes (Signed)
History of Present Illness Miranda [name] is a 71 y.o. female never smoker with history of atypical PNA , ILD, OSA and asthma . She is folowed by Dr. [name].  Synopsis: Former patient of Dr. Gwenette Greet who was hospitalized in January 2017 with Mucor pneumonia and had findings on her CT chest which were worrisome for pulmonary fibrosis. Follow up CT chest 06/2015 showed improvement, but residual scarring per lung bases. Pulmonary function testing March 2017 showed a ratio of 84%, FVC 1.96 L (77% predicted), total lung capacity 3.85 L (74% predicted), DLCO 14.32 (55% predicted) PFT Summary: Mild restrictive lung disease with a disproportionate decrease in the diffusion capacity. This can be seen with pulmonary vascular disease or pulmonary parenchymal disease. Consider further chest imaging or an echocardiogram after checking a CBC to correct the DLCO for the patient's hemoglobin value.   04/30/2016 Acute OV: Pt. Presents to the office today with shortness of breath with exertion  and headache. This started about 2 weeks ago.She states that she has been under a lot of stress. She was seen in the ED on 04/17/2016 for  for chest pain and shortness of breath after a long walk.. EKG was fine.Troponins were negative.Due to her risk factors, they wanted to admit her for observation, but she declined.She states she has been having to work long hours. She does domestic work.She states she has a cough, sometimes clear secretions and sometimes light yellow.She denies wheezing.She states that she has lost from 220 pounds to 208 pounds in one month.She states she is eating much less, and suspects that is the reason.HGB was 13.4 in ED 04/17/2016 so doubt this is anemia.  She denies fever, chest pain orthopnea or hemoptysis.No recent car or airline travel.No leg pain, edema.   Tests Immunology 03/21/2015 Results for Miranda [name] (MRN [id number]) as of 04/30/2016 11:32  Ref. Range 03/21/2015 05:39  ANA Ab, IFA Unknown  Negative  ANCA Proteinase 3 Latest Ref Range: 0.0 - 3.5 U/mL <3.5  Myeloperoxidase Abs Latest Ref Range: 0.0 - 9.0 U/mL <9.0  RA Latex Turbid. Latest Ref Range: 0.0 - 13.9 IU/mL 13.6  Cytoplasmic (C-ANCA) Latest Ref Range: Neg:<1:20 titer <1:20  P-ANCA Latest Ref Range: Neg:<1:20 titer <1:20  Atypical P-ANCA titer Latest Ref Range: Neg:<1:20 titer <1:20        Past medical hx Past Medical History:  Diagnosis Date  . Anxiety   . Asthma   . CHF (congestive heart failure) ([place])    pt. unsure- but thinks she was hosp. for CHF- 2002  . [facility]    [facility]  . Colon polyps   . Complication of anesthesia    states requires a lot med. to put her to sleep   . DDD (degenerative disc disease) 09/17/2011  . Depression    "sometimes "  . Family history of anesthesia complication   . GERD (gastroesophageal reflux disease)   . Glaucoma    bilateral, pt. admits that she is noncompliant to eye gtts.   . Hemorrhoids   . Hyperlipidemia   . Hypertension    had stress, echo- 2006 /w [place], Cardiac Cath, per pt. 2002, echo repeated 2012- wnl   . Low back pain   . Shortness of breath   . Sleep apnea    uses c-pap- q night recently     Past surgical hx, Family hx, Social hx all reviewed.  Current Outpatient Prescriptions on File Prior to Visit  Medication Sig  . albuterol (  PROVENTIL HFA;VENTOLIN HFA) 108 (90 Base) MCG/ACT inhaler Inhale 2 puffs into the lungs every 6 (six) hours as needed.  . ALPRAZolam (XANAX) 0.25 MG tablet Take 1 tablet (0.25 mg total) by mouth 3 (three) times daily as needed for anxiety or sleep.  Marland Kitchen amLODipine (NORVASC) 10 MG tablet Take 1 tablet (10 mg total) by mouth daily.  . benzonatate (TESSALON) 100 MG capsule Take 1 capsule (100 mg total) by mouth 3 (three) times daily as needed.  . cyclobenzaprine (FLEXERIL) 10 MG tablet Take 1 tablet (10 mg total) by mouth 3 (three) times daily as needed. (Patient taking differently: Take 10 mg by  mouth 3 (three) times daily as needed for muscle spasms. )  . escitalopram (LEXAPRO) 5 MG tablet Take 1 tablet (5 mg total) by mouth daily.  . Fluticasone-Salmeterol (ADVAIR) 250-50 MCG/DOSE AEPB Inhale 1 puff into the lungs every 12 (twelve) hours.  Marland Kitchen HYDROcodone-acetaminophen (NORCO) 10-325 MG tablet Take 1 tablet by mouth every 6 (six) hours as needed.  . lactulose (CHRONULAC) 10 GM/15ML solution Take 30 mLs (20 g total) by mouth 3 (three) times daily as needed for mild constipation.  Marland Kitchen linaclotide (LINZESS) 290 MCG CAPS capsule Take 1 capsule (290 mcg total) by mouth daily.  Marland Kitchen losartan (COZAAR) 50 MG tablet Take 1 tablet (50 mg total) by mouth daily.  Marland Kitchen lovastatin (MEVACOR) 40 MG tablet Take 1 tablet (40 mg total) by mouth at bedtime.  . metoprolol (LOPRESSOR) 50 MG tablet Take 1 tablet (50 mg total) by mouth 2 (two) times daily.  . furosemide (LASIX) 40 MG tablet Take 0.5 tablets (20 mg total) by mouth daily. (Patient not taking: Reported on 04/30/2016)   No current facility-administered medications on file prior to visit.      Allergies  Allergen Reactions  . Fish Oil Anaphylaxis  . Other Hives, Shortness Of Breath and Swelling    Allergic to cashew nuts and peanut oil.  Marland Kitchen Penicillins Anaphylaxis, Hives and Swelling    Has patient had a PCN reaction causing immediate rash, facial/tongue/throat swelling, SOB or lightheadedness with hypotension: Face swelling and hives started first, then swelling of the throat  Has patient had a PCN reaction causing severe rash involving mucus membranes or skin necrosis: Yes  Has patient had a PCN reaction that required hospitalization: No  Has patient had a PCN reaction occurring within the last 10 years: Yes  If all of the above answers are "NO", then may proceed with Cephalospor  . Pneumococcal Vaccines Nausea And Vomiting  . Aspirin Itching and Rash  . Bactrim [Sulfamethoxazole-Trimethoprim] Hives, Itching and Rash  . Ciprofloxacin Hives, Itching  and Rash  . Ibuprofen Rash  . Influenza Vaccines Hives  . Ivp Dye [Iodinated Diagnostic Agents] Hives, Itching and Rash    Gives benadryl to counteract symptoms  . Latex Rash  . Macrobid [Nitrofurantoin Monohydrate Macrocrystals] Hives  . Shellfish Allergy Hives    Patient also allergic to seafood    Review Of Systems:  Constitutional:   +  weight loss, no night sweats,  Fevers, chills, fatigue, or  lassitude.  HEENT:   + headaches,  Difficulty swallowing,  Tooth/dental problems, or  Sore throat,                No sneezing, itching, ear ache, nasal congestion, post nasal drip,   CV:  No chest pain,  Orthopnea, PND, swelling in lower extremities, anasarca, dizziness, palpitations, syncope.   GI  No heartburn, indigestion, abdominal pain, nausea, vomiting,  diarrhea, change in bowel habits, loss of appetite, bloody stools.   Resp: + shortness of breath with exertion less at rest.  No excess mucus, no productive cough,  + non-productive cough,  No coughing up of blood.  No change in color of mucus.  No wheezing.  No chest wall deformity, Some chest tightness.  Skin: no rash or lesions.  GU: no dysuria, change in color of urine, no urgency or frequency.  No flank pain, no hematuria   MS:  No joint pain or swelling.  No decreased range of motion.  No back pain.  Psych:  No change in mood or affect. No depression or anxiety.  No memory loss.   Vital Signs BP (!) 106/52 (BP Location: Right Arm, Patient Position: Sitting, Cuff Size: Normal)   Pulse 60   Ht 5\' 5"  (1.651 m)   Wt 200 lb 12.8 oz (91.1 kg)   SpO2 98%   BMI 33.41 kg/m    Physical Exam:  General- No distress,  A&Ox3, pleasant, poor historian. ENT: No sinus tenderness, TM clear, pale nasal mucosa, no oral exudate,no post nasal drip, no LAN Cardiac: S1, S2, regular rate and rhythm, no murmur Chest: No wheeze/+ crackles/no  dullness; no accessory muscle use, no nasal flaring, no sternal retractions, slightly diminished  per bases. Abd.: Soft Non-tender, obese Ext: No clubbing cyanosis, edema Neuro:  normal strength Skin: No rashes, warm and dry Psych: normal mood and behavior, very pleasant   Assessment/Plan  OSA (obstructive sleep apnea) No longer using CPAP. Plan: Consider re-initiation of therapy if patient desires.  Abnormal CT scan, chest Abnormal CT chest 04/11/2016 Suspected resolving CAP, but crackles on exam concerning for pulmonary fibrosis Immunology suspicious for PF New onset dyspnea with exertions  x 2 months No complaints of pain/ no recent travel/ no leg edema or c/o leg pain. Plan: D-Dimer to assess for possible PE as acute increase in dyspnea x 2 weeks If D-Dimer +>> CTA If D-Dimer ->> HRCT to re-evaluate fibrosis/Repeat PFT's Follow up with Dr. [name] after imaging and PFT's result. Please contact office for sooner follow up if symptoms do not improve or worsen or seek emergency care      Magdalen Spatz, NP 04/30/2016  2:31 PM

## 2016-05-01 ENCOUNTER — Other Ambulatory Visit: Payer: Self-pay | Admitting: Acute Care

## 2016-05-01 ENCOUNTER — Inpatient Hospital Stay (HOSPITAL_COMMUNITY)
Admission: EM | Admit: 2016-05-01 | Discharge: 2016-05-03 | DRG: 176 | Disposition: A | Discharge status: Home / Self Care | Payer: Medicare Other | Attending: Family Medicine | Admitting: Family Medicine

## 2016-05-01 ENCOUNTER — Encounter (HOSPITAL_COMMUNITY): Payer: Self-pay

## 2016-05-01 ENCOUNTER — Emergency Department (HOSPITAL_COMMUNITY): Payer: Medicare Other

## 2016-05-01 ENCOUNTER — Telehealth: Payer: Self-pay | Admitting: Acute Care

## 2016-05-01 DIAGNOSIS — R001 Bradycardia, unspecified: Secondary | ICD-10-CM | POA: Diagnosis not present

## 2016-05-01 DIAGNOSIS — I10 Essential (primary) hypertension: Secondary | ICD-10-CM | POA: Diagnosis not present

## 2016-05-01 DIAGNOSIS — Z803 Family history of malignant neoplasm of breast: Secondary | ICD-10-CM | POA: Diagnosis not present

## 2016-05-01 DIAGNOSIS — Z8249 Family history of ischemic heart disease and other diseases of the circulatory system: Secondary | ICD-10-CM | POA: Diagnosis not present

## 2016-05-01 DIAGNOSIS — R7989 Other specified abnormal findings of blood chemistry: Secondary | ICD-10-CM

## 2016-05-01 DIAGNOSIS — G4733 Obstructive sleep apnea (adult) (pediatric): Secondary | ICD-10-CM | POA: Diagnosis present

## 2016-05-01 DIAGNOSIS — I2699 Other pulmonary embolism without acute cor pulmonale: Principal | ICD-10-CM | POA: Diagnosis present

## 2016-05-01 DIAGNOSIS — E785 Hyperlipidemia, unspecified: Secondary | ICD-10-CM | POA: Diagnosis present

## 2016-05-01 DIAGNOSIS — R0602 Shortness of breath: Secondary | ICD-10-CM | POA: Diagnosis present

## 2016-05-01 DIAGNOSIS — K219 Gastro-esophageal reflux disease without esophagitis: Secondary | ICD-10-CM | POA: Diagnosis not present

## 2016-05-01 DIAGNOSIS — R0600 Dyspnea, unspecified: Secondary | ICD-10-CM

## 2016-05-01 DIAGNOSIS — Z825 Family history of asthma and other chronic lower respiratory diseases: Secondary | ICD-10-CM | POA: Diagnosis not present

## 2016-05-01 DIAGNOSIS — Z91041 Radiographic dye allergy status: Secondary | ICD-10-CM

## 2016-05-01 DIAGNOSIS — M79604 Pain in right leg: Secondary | ICD-10-CM

## 2016-05-01 DIAGNOSIS — Z9119 Patient's noncompliance with other medical treatment and regimen: Secondary | ICD-10-CM | POA: Diagnosis not present

## 2016-05-01 DIAGNOSIS — F329 Major depressive disorder, single episode, unspecified: Secondary | ICD-10-CM | POA: Diagnosis present

## 2016-05-01 DIAGNOSIS — Z79899 Other long term (current) drug therapy: Secondary | ICD-10-CM

## 2016-05-01 DIAGNOSIS — Z90710 Acquired absence of both cervix and uterus: Secondary | ICD-10-CM

## 2016-05-01 DIAGNOSIS — H409 Unspecified glaucoma: Secondary | ICD-10-CM | POA: Diagnosis not present

## 2016-05-01 DIAGNOSIS — J849 Interstitial pulmonary disease, unspecified: Secondary | ICD-10-CM

## 2016-05-01 DIAGNOSIS — J45909 Unspecified asthma, uncomplicated: Secondary | ICD-10-CM | POA: Diagnosis not present

## 2016-05-01 LAB — CBC
HCT: 39.6 % (ref 36.0–46.0)
Hemoglobin: 13.3 g/dL (ref 12.0–15.0)
MCH: 28.1 pg (ref 26.0–34.0)
MCHC: 33.6 g/dL (ref 30.0–36.0)
MCV: 83.7 fL (ref 78.0–100.0)
PLATELETS: 148 10*3/uL — AB (ref 150–400)
RBC: 4.73 MIL/uL (ref 3.87–5.11)
RDW: 14 % (ref 11.5–15.5)
WBC: 4.6 10*3/uL (ref 4.0–10.5)

## 2016-05-01 LAB — APTT: aPTT: 24 seconds (ref 24–36)

## 2016-05-01 LAB — I-STAT CHEM 8, ED
BUN: 13 mg/dL (ref 6–20)
CHLORIDE: 103 mmol/L (ref 101–111)
Calcium, Ion: 1.17 mmol/L (ref 1.15–1.40)
Creatinine, Ser: 0.8 mg/dL (ref 0.44–1.00)
Glucose, Bld: 111 mg/dL — ABNORMAL HIGH (ref 65–99)
HEMATOCRIT: 40 % (ref 36.0–46.0)
Hemoglobin: 13.6 g/dL (ref 12.0–15.0)
POTASSIUM: 3.9 mmol/L (ref 3.5–5.1)
SODIUM: 141 mmol/L (ref 135–145)
TCO2: 27 mmol/L (ref 0–100)

## 2016-05-01 LAB — PROTIME-INR
INR: 1.1
Prothrombin Time: 14.2 seconds (ref 11.4–15.2)

## 2016-05-01 MED ORDER — METHYLPREDNISOLONE SODIUM SUCC 125 MG IJ SOLR
INTRAMUSCULAR | Status: AC
  Filled 2016-05-01: qty 2

## 2016-05-01 MED ORDER — AMLODIPINE BESYLATE 10 MG PO TABS
10.0000 mg | ORAL_TABLET | Freq: Every day | ORAL | Status: DC
  Administered 2016-05-02 – 2016-05-03 (×2): 10 mg via ORAL
  Filled 2016-05-01 (×2): qty 1

## 2016-05-01 MED ORDER — SODIUM CHLORIDE 0.9% FLUSH
3.0000 mL | Freq: Two times a day (BID) | INTRAVENOUS | Status: DC
  Administered 2016-05-02: 3 mL via INTRAVENOUS

## 2016-05-01 MED ORDER — FUROSEMIDE 20 MG PO TABS
20.0000 mg | ORAL_TABLET | Freq: Every day | ORAL | Status: DC
  Administered 2016-05-02 – 2016-05-03 (×2): 20 mg via ORAL
  Filled 2016-05-01 (×2): qty 1

## 2016-05-01 MED ORDER — PRAVASTATIN SODIUM 40 MG PO TABS
40.0000 mg | ORAL_TABLET | Freq: Every day | ORAL | Status: DC
  Administered 2016-05-02: 40 mg via ORAL
  Filled 2016-05-01 (×2): qty 1

## 2016-05-01 MED ORDER — METHYLPREDNISOLONE SODIUM SUCC 125 MG IJ SOLR
60.0000 mg | Freq: Once | INTRAMUSCULAR | Status: AC
  Administered 2016-05-01: 60 mg via INTRAVENOUS
  Filled 2016-05-01: qty 2

## 2016-05-01 MED ORDER — HEPARIN (PORCINE) IN NACL 100-0.45 UNIT/ML-% IJ SOLN
1300.0000 [IU]/h | INTRAMUSCULAR | Status: DC
Start: 2016-05-01 — End: 2016-05-02
  Administered 2016-05-01: 1300 [IU]/h via INTRAVENOUS
  Filled 2016-05-01: qty 250

## 2016-05-01 MED ORDER — ESCITALOPRAM OXALATE 10 MG PO TABS
5.0000 mg | ORAL_TABLET | Freq: Every day | ORAL | Status: DC
  Administered 2016-05-02 – 2016-05-03 (×2): 5 mg via ORAL
  Filled 2016-05-01 (×2): qty 1

## 2016-05-01 MED ORDER — DIPHENHYDRAMINE HCL 50 MG/ML IJ SOLN
INTRAMUSCULAR | Status: AC
  Filled 2016-05-01: qty 1

## 2016-05-01 MED ORDER — IOPAMIDOL (ISOVUE-370) INJECTION 76%
INTRAVENOUS | Status: AC
  Administered 2016-05-01: 100 mL via INTRAVENOUS
  Filled 2016-05-01: qty 100

## 2016-05-01 MED ORDER — ACETAMINOPHEN 650 MG RE SUPP: 650.0000 mg | Freq: Four times a day (QID) | RECTAL | Status: DC | PRN

## 2016-05-01 MED ORDER — LINACLOTIDE 290 MCG PO CAPS
290.0000 ug | ORAL_CAPSULE | Freq: Every day | ORAL | Status: DC
  Administered 2016-05-02 – 2016-05-03 (×2): 290 ug via ORAL
  Filled 2016-05-01 (×2): qty 1

## 2016-05-01 MED ORDER — HEPARIN BOLUS VIA INFUSION
4000.0000 [IU] | INTRAVENOUS | Status: AC
  Administered 2016-05-01: 4000 [IU] via INTRAVENOUS
  Filled 2016-05-01: qty 4000

## 2016-05-01 MED ORDER — ONDANSETRON HCL 4 MG PO TABS: 4.0000 mg | ORAL_TABLET | Freq: Four times a day (QID) | ORAL | Status: DC | PRN

## 2016-05-01 MED ORDER — BENZONATATE 100 MG PO CAPS: 100.0000 mg | ORAL_CAPSULE | Freq: Three times a day (TID) | ORAL | Status: DC | PRN

## 2016-05-01 MED ORDER — DIPHENHYDRAMINE HCL 50 MG/ML IJ SOLN
25.0000 mg | Freq: Once | INTRAMUSCULAR | Status: AC
  Administered 2016-05-01: 25 mg via INTRAVENOUS
  Filled 2016-05-01: qty 1

## 2016-05-01 MED ORDER — LOSARTAN POTASSIUM 50 MG PO TABS
50.0000 mg | ORAL_TABLET | Freq: Every day | ORAL | Status: DC
  Administered 2016-05-02 – 2016-05-03 (×2): 50 mg via ORAL
  Filled 2016-05-01 (×2): qty 1

## 2016-05-01 MED ORDER — IPRATROPIUM-ALBUTEROL 0.5-2.5 (3) MG/3ML IN SOLN
3.0000 mL | Freq: Four times a day (QID) | RESPIRATORY_TRACT | Status: DC
  Administered 2016-05-02: 3 mL via RESPIRATORY_TRACT
  Filled 2016-05-01: qty 3

## 2016-05-01 MED ORDER — ALPRAZOLAM 0.25 MG PO TABS
0.2500 mg | ORAL_TABLET | Freq: Three times a day (TID) | ORAL | Status: DC | PRN
  Administered 2016-05-01: 0.25 mg via ORAL
  Filled 2016-05-01: qty 1

## 2016-05-01 MED ORDER — METOPROLOL TARTRATE 25 MG PO TABS
25.0000 mg | ORAL_TABLET | Freq: Two times a day (BID) | ORAL | Status: DC
  Administered 2016-05-01 – 2016-05-03 (×4): 25 mg via ORAL
  Filled 2016-05-01 (×4): qty 1

## 2016-05-01 MED ORDER — ACETAMINOPHEN 325 MG PO TABS
650.0000 mg | ORAL_TABLET | Freq: Four times a day (QID) | ORAL | Status: DC | PRN
  Administered 2016-05-02: 650 mg via ORAL
  Filled 2016-05-01: qty 2

## 2016-05-01 MED ORDER — HYDROCODONE-ACETAMINOPHEN 10-325 MG PO TABS
1.0000 | ORAL_TABLET | Freq: Four times a day (QID) | ORAL | Status: DC | PRN
  Administered 2016-05-02 – 2016-05-03 (×2): 1 via ORAL
  Filled 2016-05-01 (×2): qty 1

## 2016-05-01 MED ORDER — ONDANSETRON HCL 4 MG/2ML IJ SOLN: 4.0000 mg | Freq: Four times a day (QID) | INTRAMUSCULAR | Status: DC | PRN

## 2016-05-01 MED ORDER — SODIUM CHLORIDE 0.9 % IJ SOLN
INTRAMUSCULAR | Status: AC
  Filled 2016-05-01: qty 50

## 2016-05-01 NOTE — Telephone Encounter (Signed)
Spoke with pt. She is aware of her D-Dimer results. VQ scan and CXR orders have been entered by SG. Nothing further was needed.

## 2016-05-01 NOTE — Progress Notes (Signed)
ANTICOAGULATION CONSULT NOTE - Initial Consult  Pharmacy Consult for IV heparin Indication: pulmonary embolism  Allergies  Allergen Reactions  . Fish Oil Anaphylaxis  . Other Hives, Shortness Of Breath and Swelling    Allergic to cashew nuts and peanut oil.  Marland Kitchen Penicillins Anaphylaxis, Hives and Swelling    Has patient had a PCN reaction causing immediate rash, facial/tongue/throat swelling, SOB or lightheadedness with hypotension: Face swelling and hives started first, then swelling of the throat  Has patient had a PCN reaction causing severe rash involving mucus membranes or skin necrosis: Yes  Has patient had a PCN reaction that required hospitalization: No  Has patient had a PCN reaction occurring within the last 10 years: Yes  If all of the above answers are "NO", then may proceed with Cephalospor  . Pneumococcal Vaccines Nausea And Vomiting  . Aspirin Itching and Rash  . Bactrim [Sulfamethoxazole-Trimethoprim] Hives, Itching and Rash  . Ciprofloxacin Hives, Itching and Rash  . Ibuprofen Rash  . Influenza Vaccines Hives  . Ivp Dye [Iodinated Diagnostic Agents] Hives, Itching and Rash    Gives benadryl to counteract symptoms  . Latex Rash  . Macrobid [Nitrofurantoin Monohydrate Macrocrystals] Hives  . Shellfish Allergy Hives    Patient also allergic to seafood    Patient Measurements: Height: 5\' 5"  (165.1 cm) Weight: 205 lb (93 kg) IBW/kg (Calculated) : 57 Heparin Dosing Weight: 77.8 kg  Vital Signs: Temp: 98.2 F (36.8 C) (02/16 1942) Temp Source: Oral (02/16 1942) BP: 174/73 (02/16 1942) Pulse Rate: 50 (02/16 1942)  Labs:  Recent Labs  05/01/16 2046 05/01/16 2157  HGB 13.6 13.3  HCT 40.0 39.6  PLT  --  148*  APTT  --  24  LABPROT  --  14.2  INR  --  1.10  CREATININE 0.80  --     Estimated Creatinine Clearance: 74.8 mL/min (by C-G formula based on SCr of 0.8 mg/dL).   Medical History: Past Medical History:  Diagnosis Date  . Anxiety   . Asthma   .  CHF (congestive heart failure) ([place])    pt. unsure- but thinks she was hosp. for CHF- 2002  . [facility] kidney disease    [facility]  . Colon poly[facility]   . Complication of anesthesia    states requires a lot med. to put her to sleep   . DDD (degenerative disc disease) 09/17/2011  . Depression    "sometimes "  . Family history of anesthesia complication   . GERD (gastroesophageal reflux disease)   . Glaucoma    bilateral, pt. admits that she is noncompliant to eye gtts.   . Hemorrhoids   . Hyperlipidemia   . Hypertension    had stress, echo- 2006 /w [place], Cardiac Cath, per pt. 2002, echo repeated 2012- wnl   . Low back pain   . Shortness of breath   . Sleep apnea    uses c-pap- q night recently     Assessment: 86 y/oF seen at pulmonology office yesterday for SOB with exertion. D-Dimer elevated at 3.17, so was sent to ED for evaluation. CTa of chest + for PE. Pharmacy consulted to assist with dosing of heparin infusion. Patient not on any anticoagulants PTA. Baseline aPTT, PT/INR WNL. H/H WNL, Pltc slightly low at 148K.  Goal of Therapy:  Heparin level 0.3-0.7 units/ml Monitor platelets by anticoagulation protocol: Yes   Plan:  Heparin 4000 units IV bolus x 1, then IV heparin infusion at 1300 units/hr. Heparin level 6  hours after heparin infusion started. Daily heparin level and CBC. Monitor closely for s/sx of bleeding.   Lindell Spar, PharmD, BCPS Pager: [phone] 05/01/2016 10:03 PM

## 2016-05-01 NOTE — H&P (Signed)
History and Physical    Miranda [name] [id number] DOB: 10/24/1945 DOA: 05/01/2016  PCP: Nyoka Cowden, MD  Pulmonary: [place]  Patient coming from: Home  Chief Complaint: Progressive shortness of breath for almost two weeks.  HPI: Miranda [name] is a 71 y.o. woman with a history of ILD, atypical pneumonia ([place]), OSA but she is no longer using CPAP, asthma, and HTN who presents to the ED for further evaluation after an outpatient D-Dimer was found to be elevated.  She has had shortness of breath with exertion for almost two weeks.  It has bee intermittently associated with chest pain, but the dyspnea has been her main symptom and her main concern.  She was actually seen in the ED at [facility] earlier this month.  Observation status was recommended, but she declined.  Since then, she has seen her PCP and her pulmonologist for similar complaints.  She was referred for D-Dimer as an outpatient, which is elevated to 3.17.  She was called and advised to report to the ED for CTA of her chest.  She has had intermittent headache.  She has had intermittent cough productive of yellow phlegm.  No nausea, vomiting, or syncope.  She has had increased orthopnea but no swelling.  ED Course: She has an allergy to contrast dye, so she was treated with IV benadryl and IV solumedrol prior to going for CTA of her chest (PE Protocol).  Unfortunately, this study shows multiple segmental PE affecting the upper lobe(s).  Heparin infusion has been ordered per pharmacy protocol.  Hospitalist asked to place in observation.  Review of Systems: As per HPI otherwise 10 point review of systems negative.    Past Medical History:  Diagnosis Date  . Anxiety   . Asthma   . CHF (congestive heart failure) ([place])    pt. unsure- but thinks she was hosp. for CHF- 2002  . [facility]    [facility]  . Colon polyps   . Complication of anesthesia    states requires a lot med. to put her to  sleep   . DDD (degenerative disc disease) 09/17/2011  . Depression    "sometimes "  . Family history of anesthesia complication   . GERD (gastroesophageal reflux disease)   . Glaucoma    bilateral, pt. admits that she is noncompliant to eye gtts.   . Hemorrhoids   . Hyperlipidemia   . Hypertension    had stress, echo- 2006 /w [place], Cardiac Cath, per pt. 2002, echo repeated 2012- wnl   . Low back pain   . Shortness of breath   . Sleep apnea    uses c-pap- q night recently    Past Surgical History:  Procedure Laterality Date  . ABDOMINAL HYSTERECTOMY     ectopic, fibroids  . CARDIAC CATHETERIZATION    . COLONOSCOPY     remote  . EYE SURGERY     cataracts removed bilateral- ?IOL  . FISSURECTOMY  10/08/2011   Procedure: FISSURECTOMY;  Surgeon: Stark Klein, MD;  Location: [facility];  Service: General;  Laterality: N/A;  . FLEXIBLE SIGMOIDOSCOPY  02/25/2011   Procedure: FLEXIBLE SIGMOIDOSCOPY;  Surgeon: Inda Castle, MD;  Location: [facility];  Service: Endoscopy;  Laterality: N/A;  . FOOT SURGERY     bilat, heel spurs- screw in R foot   . HEMORRHOID SURGERY  10/08/2011   Procedure: HEMORRHOIDECTOMY;  Surgeon: Stark Klein, MD;  Location: [place];  Service: General;  Laterality: N/A;  External   . SPHINCTEROTOMY  10/08/2011   Procedure: SPHINCTEROTOMY;  Surgeon: Stark Klein, MD;  Location: [place];  Service: General;  Laterality: N/A;     reports that she has never smoked. She has never used smokeless tobacco. She reports that she does not drink alcohol or use drugs. She is married.  She has four children and 6 granddchildren.  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  Allergies  Allergen Reactions  . Fish Oil Anaphylaxis  . Other Hives, Shortness Of Breath and Swelling    Allergic to cashew nuts and peanut oil.  Marland Kitchen Penicillins Anaphylaxis, Hives and Swelling    Has patient had a PCN reaction causing immediate rash, facial/tongue/throat swelling, SOB or  lightheadedness with hypotension: Face swelling and hives started first, then swelling of the throat  Has patient had a PCN reaction causing severe rash involving mucus membranes or skin necrosis: Yes  Has patient had a PCN reaction that required hospitalization: No  Has patient had a PCN reaction occurring within the last 10 years: Yes  If all of the above answers are "NO", then may proceed with Cephalospor  . Pneumococcal Vaccines Nausea And Vomiting  . Aspirin Itching and Rash  . Bactrim [Sulfamethoxazole-Trimethoprim] Hives, Itching and Rash  . Ciprofloxacin Hives, Itching and Rash  . Ibuprofen Rash  . Influenza Vaccines Hives  . Ivp Dye [Iodinated Diagnostic Agents] Hives, Itching and Rash    Gives benadryl to counteract symptoms  . Latex Rash  . Macrobid [Nitrofurantoin Monohydrate Macrocrystals] Hives  . Shellfish Allergy Hives    Patient also allergic to seafood    Family History  Problem Relation Age of Onset  . Cancer Mother     breast  . Heart attack Mother   . Heart disease Mother   . Cancer Other     breast, colon, lung  . Heart disease Other   . Emphysema Sister   . Cancer Sister     breast  . Asthma Sister   . Heart disease Sister   . Rheum arthritis Sister   . Cancer Brother     colon  . Anesthesia problems Neg Hx      Prior to Admission medications   Medication Sig Start Date End Date Taking? Authorizing Provider  albuterol (PROVENTIL HFA;VENTOLIN HFA) 108 (90 Base) MCG/ACT inhaler Inhale 2 puffs into the lungs every 6 (six) hours as needed. 10/14/15 10/13/16 Yes Marletta Lor, MD  ALPRAZolam Duanne Moron) 0.25 MG tablet Take 1 tablet (0.25 mg total) by mouth 3 (three) times daily as needed for anxiety or sleep. 02/28/16  Yes Marletta Lor, MD  amLODipine (NORVASC) 10 MG tablet Take 1 tablet (10 mg total) by mouth daily. 04/20/16  Yes Marletta Lor, MD  benzonatate (TESSALON) 100 MG capsule Take 1 capsule (100 mg total) by mouth 3 (three) times  daily as needed. 02/28/16  Yes Marletta Lor, MD  cyclobenzaprine (FLEXERIL) 10 MG tablet Take 1 tablet (10 mg total) by mouth 3 (three) times daily as needed. Patient taking differently: Take 10 mg by mouth 3 (three) times daily as needed for muscle spasms.  03/07/15  Yes Marletta Lor, MD  escitalopram (LEXAPRO) 5 MG tablet Take 1 tablet (5 mg total) by mouth daily. 10/14/15  Yes Marletta Lor, MD  Fluticasone-Salmeterol (ADVAIR) 250-50 MCG/DOSE AEPB Inhale 1 puff into the lungs every 12 (twelve) hours. 11/28/15  Yes Marletta Lor, MD  furosemide (LASIX) 40 MG tablet Take 0.5  tablets (20 mg total) by mouth daily. 04/20/16  Yes Marletta Lor, MD  HYDROcodone-acetaminophen [facility]) 10-325 MG tablet Take 1 tablet by mouth every 6 (six) hours as needed. 04/20/16  Yes Marletta Lor, MD  lactulose (CHRONULAC) 10 GM/15ML solution Take 30 mLs (20 g total) by mouth 3 (three) times daily as needed for mild constipation. 03/08/15  Yes Marletta Lor, MD  linaclotide [facility]) 290 MCG CAPS capsule Take 1 capsule (290 mcg total) by mouth daily. 04/20/16  Yes Marletta Lor, MD  losartan (COZAAR) 50 MG tablet Take 1 tablet (50 mg total) by mouth daily. 04/20/16  Yes Marletta Lor, MD  lovastatin (MEVACOR) 40 MG tablet Take 1 tablet (40 mg total) by mouth at bedtime. 04/20/16  Yes Marletta Lor, MD  metoprolol (LOPRESSOR) 50 MG tablet Take 1 tablet (50 mg total) by mouth 2 (two) times daily. 04/20/16  Yes Marletta Lor, MD  naproxen sodium (ANAPROX) 220 MG tablet Take 440 mg by mouth 2 (two) times daily with a meal.   Yes Historical Provider, MD    Physical Exam: Vitals:   05/01/16 1803 05/01/16 1942 05/01/16 2148  BP: 141/69 174/73   Pulse: (!) 47 (!) 50   Resp: 18 18   Temp: 98.9 F (37.2 C) 98.2 F (36.8 C)   TempSrc: Oral Oral   SpO2: 97% 97%   Weight: 93 kg (205 lb)  93 kg (205 lb)  Height: 5\' 5"  (1.651 m)  5\' 5"  (1.651 m)      Constitutional:  NAD, calm, comfortable, appears anxious but NONtoxic Vitals:   05/01/16 1803 05/01/16 1942 05/01/16 2148  BP: 141/69 174/73   Pulse: (!) 47 (!) 50   Resp: 18 18   Temp: 98.9 F (37.2 C) 98.2 F (36.8 C)   TempSrc: Oral Oral   SpO2: 97% 97%   Weight: 93 kg (205 lb)  93 kg (205 lb)  Height: 5\' 5"  (1.651 m)  5\' 5"  (1.651 m)   Eyes: PERRL, lids and conjunctivae normal ENMT: Mucous membranes are moist. Posterior pharynx clear of any exudate or lesions. Normal dentition.  Neck: normal appearance, supple, no masses Respiratory: clear to auscultation bilaterally, no wheezing, no crackles. Normal respiratory effort. No accessory muscle use.  Cardiovascular: Bradycardic but regular.  No murmurs / rubs / gallops. No extremity edema. 2+ pedal pulses.  GI: abdomen is soft and compressible.  No distention.  No tenderness.  No masses palpated.  Bowel sounds are present. Musculoskeletal:  No joint deformity in upper and lower extremities. Good ROM, no contractures. Normal muscle tone.  No palpable cords in right leg. Skin: no rashes, warm and dry Neurologic: CN 2-12 grossly intact. Sensation intact, Strength symmetric bilaterally, 5/5  Psychiatric: Normal judgment and insight. Alert and oriented x 3. Normal mood.     Labs on Admission: I have personally reviewed following labs and imaging studies  CBC:  Recent Labs Lab 05/01/16 2046 05/01/16 2157  WBC  --  4.6  HGB 13.6 13.3  HCT 40.0 39.6  MCV  --  83.7  PLT  --  123456*   Basic Metabolic Panel:  Recent Labs Lab 05/01/16 2046  NA 141  K 3.9  CL 103  GLUCOSE 111*  BUN 13  CREATININE 0.80   GFR: Estimated Creatinine Clearance: 74.8 mL/min (by C-G formula based on SCr of 0.8 mg/dL).  D-Dimer 3.17  Radiological Exams on Admission: Ct Angio Chest Pe W And/or Wo Contrast  Result Date:  05/01/2016 CLINICAL DATA:  71 y/o  F; shortness of breath and elevated D-dimer. EXAM: CT ANGIOGRAPHY CHEST WITH CONTRAST TECHNIQUE: Multidetector  CT imaging of the chest was performed using the standard protocol during bolus administration of intravenous contrast. Multiplanar CT image reconstructions and MIPs were obtained to evaluate the vascular anatomy. CONTRAST:  100 cc Isovue 370 COMPARISON:  06/24/2015 CT of the chest. FINDINGS: Cardiovascular: Satisfactory opacification of the pulmonary arteries to segmental level. Multiple upper lobe segmental pulmonary emboli (series 10 image 92 and 109). RV/LV ratio equals 0.86 Stable cardiomegaly. Mild coronary artery calcification. Normal size main pulmonary artery. Normal size thoracic aorta. Mild aortic atherosclerosis with calcification. Predominantly fibrofatty plaque of left subclavian artery origin with mild-to-moderate stenosis. Mediastinum/Nodes: Stable prominent mediastinal lymph nodes. Normal thyroid gland. Normal thoracic esophagus. Lungs/Pleura: Peripheral reticulation of the lung bases compatible pulmonary fibrosis better characterized on prior high-resolution CT of the chest. No consolidation, pleural effusion, or pneumothorax. Upper Abdomen: Negative. Musculoskeletal:  No acute osseous abnormality. Review of the MIP images confirms the above findings. IMPRESSION: 1. Positive for multiple segmental pulmonary emboli. No evidence for right heart strain. 2. Pulmonary fibrosis better characterized on prior high-resolution CT of chest. 3. Aortic atherosclerosis and coronary artery calcifications. Stable cardiomegaly. These results were called by telephone at the time of interpretation on 05/01/2016 at 9:21 pm to Dr. Varney Biles , who verbally acknowledged these results. Electronically Signed   By: Kristine Garbe M.D.   On: 05/01/2016 21:23    EKG: Independently reviewed. EKG shows a sinus bradycardia.  I do not see significant ST elevations.  Assessment/Plan Principal Problem:   Acute pulmonary embolism (HCC) Active Problems:   HTN (hypertension)   Right leg pain   ILD (interstitial  lung disease) (HCC)   Bradycardia      Acute PE.  No O2 requirement, tachycardia (but she is on a BB at baseline), or hypotenstion.  No evidence of right heart strain on CT. --Agree with IV heparin infusion per protocol for now --Will check RLE doppler since she has had pain in that leg --Check complete echo in the AM --If she remains hemodynamically stable, I suspect she will be able to go home on one of the NOACs (CKD in her history but has normal BUN and creatinine)  Bradycardia --Metoprolol dose reduced and hold parameters in place --Monitor on telemetry  HTN --Cozaar, lasix, amlodipine, metoprolol  HLD --Statin  Depression --Lexapro  ILD --Duonebs as needed during this observation stay.  Resume Advair at home (unless she ends up requiring a prolonged hospitalization). --She is not on home oxygen, but certainly higher risk for decompensation since she has underlying pulmonary disease.  DVT prophylaxis: Anticoagulated with IV heparin Code Status: FULL Family Communication: Patient's granddaughter present in the ED at time of admission. Disposition Plan: Expect she will go home at discharge. Consults called: NONE Admission status: Place in observation with telemetry monitoring.   TIME SPENT: 60 minutes   Eber Jones MD [facility] Pager [phone]  If 7PM-7AM, please contact night-coverage [email or web address] Password [email or web address]  05/01/2016, 10:10 PM

## 2016-05-01 NOTE — Progress Notes (Signed)
Reviewed, agree 

## 2016-05-01 NOTE — Telephone Encounter (Addendum)
Making an addendum:  I called the pt to advise her to go to the nearest emergency room. Miranda [name] attempted to call [facility] to get pt scheduled but they were gone for the day. Pt stated that she will go to [facility] ED to be evaluated. Nothing further was needed.

## 2016-05-02 ENCOUNTER — Observation Stay (HOSPITAL_BASED_OUTPATIENT_CLINIC_OR_DEPARTMENT_OTHER): Payer: Medicare Other

## 2016-05-02 ENCOUNTER — Observation Stay (HOSPITAL_COMMUNITY): Payer: Medicare Other

## 2016-05-02 DIAGNOSIS — Z803 Family history of malignant neoplasm of breast: Secondary | ICD-10-CM | POA: Diagnosis not present

## 2016-05-02 DIAGNOSIS — Z8249 Family history of ischemic heart disease and other diseases of the circulatory system: Secondary | ICD-10-CM | POA: Diagnosis not present

## 2016-05-02 DIAGNOSIS — J849 Interstitial pulmonary disease, unspecified: Secondary | ICD-10-CM | POA: Diagnosis not present

## 2016-05-02 DIAGNOSIS — I2699 Other pulmonary embolism without acute cor pulmonale: Secondary | ICD-10-CM | POA: Diagnosis not present

## 2016-05-02 DIAGNOSIS — E785 Hyperlipidemia, unspecified: Secondary | ICD-10-CM | POA: Diagnosis not present

## 2016-05-02 DIAGNOSIS — K219 Gastro-esophageal reflux disease without esophagitis: Secondary | ICD-10-CM | POA: Diagnosis not present

## 2016-05-02 DIAGNOSIS — Z9119 Patient's noncompliance with other medical treatment and regimen: Secondary | ICD-10-CM | POA: Diagnosis not present

## 2016-05-02 DIAGNOSIS — H409 Unspecified glaucoma: Secondary | ICD-10-CM | POA: Diagnosis not present

## 2016-05-02 DIAGNOSIS — I10 Essential (primary) hypertension: Secondary | ICD-10-CM | POA: Diagnosis not present

## 2016-05-02 DIAGNOSIS — R001 Bradycardia, unspecified: Secondary | ICD-10-CM | POA: Diagnosis not present

## 2016-05-02 DIAGNOSIS — Z79899 Other long term (current) drug therapy: Secondary | ICD-10-CM | POA: Diagnosis not present

## 2016-05-02 DIAGNOSIS — G4733 Obstructive sleep apnea (adult) (pediatric): Secondary | ICD-10-CM | POA: Diagnosis not present

## 2016-05-02 DIAGNOSIS — Z91041 Radiographic dye allergy status: Secondary | ICD-10-CM | POA: Diagnosis not present

## 2016-05-02 DIAGNOSIS — J45909 Unspecified asthma, uncomplicated: Secondary | ICD-10-CM | POA: Diagnosis not present

## 2016-05-02 DIAGNOSIS — Z825 Family history of asthma and other chronic lower respiratory diseases: Secondary | ICD-10-CM | POA: Diagnosis not present

## 2016-05-02 LAB — CBC
HEMATOCRIT: 40.4 % (ref 36.0–46.0)
HEMOGLOBIN: 13.4 g/dL (ref 12.0–15.0)
MCH: 27.9 pg (ref 26.0–34.0)
MCHC: 33.2 g/dL (ref 30.0–36.0)
MCV: 84 fL (ref 78.0–100.0)
Platelets: 155 10*3/uL (ref 150–400)
RBC: 4.81 MIL/uL (ref 3.87–5.11)
RDW: 13.9 % (ref 11.5–15.5)
WBC: 5.8 10*3/uL (ref 4.0–10.5)

## 2016-05-02 LAB — COMPREHENSIVE METABOLIC PANEL
ALT: 20 U/L (ref 14–54)
ANION GAP: 7 (ref 5–15)
AST: 21 U/L (ref 15–41)
Albumin: 4.1 g/dL (ref 3.5–5.0)
Alkaline Phosphatase: 52 U/L (ref 38–126)
BUN: 14 mg/dL (ref 6–20)
CHLORIDE: 105 mmol/L (ref 101–111)
CO2: 27 mmol/L (ref 22–32)
CREATININE: 0.84 mg/dL (ref 0.44–1.00)
Calcium: 9.7 mg/dL (ref 8.9–10.3)
Glucose, Bld: 153 mg/dL — ABNORMAL HIGH (ref 65–99)
Potassium: 4.3 mmol/L (ref 3.5–5.1)
Sodium: 139 mmol/L (ref 135–145)
Total Bilirubin: 0.6 mg/dL (ref 0.3–1.2)
Total Protein: 7.8 g/dL (ref 6.5–8.1)

## 2016-05-02 LAB — ECHOCARDIOGRAM COMPLETE
HEIGHTINCHES: 65 in
Weight: 3280 oz

## 2016-05-02 LAB — HEPARIN LEVEL (UNFRACTIONATED): HEPARIN UNFRACTIONATED: 1.2 [IU]/mL — AB (ref 0.30–0.70)

## 2016-05-02 LAB — TROPONIN I: Troponin I: <0.03 ng/mL (ref <0.03)

## 2016-05-02 MED ORDER — IPRATROPIUM-ALBUTEROL 0.5-2.5 (3) MG/3ML IN SOLN
3.0000 mL | Freq: Two times a day (BID) | RESPIRATORY_TRACT | Status: DC
  Administered 2016-05-02 – 2016-05-03 (×2): 3 mL via RESPIRATORY_TRACT
  Filled 2016-05-02 (×2): qty 3

## 2016-05-02 MED ORDER — HEPARIN (PORCINE) IN NACL 100-0.45 UNIT/ML-% IJ SOLN: 1100.0000 [IU]/h | INTRAMUSCULAR | Status: AC

## 2016-05-02 MED ORDER — RIVAROXABAN 15 MG PO TABS
15.0000 mg | ORAL_TABLET | Freq: Two times a day (BID) | ORAL | Status: DC
  Administered 2016-05-02 – 2016-05-03 (×3): 15 mg via ORAL
  Filled 2016-05-02 (×3): qty 1

## 2016-05-02 NOTE — Progress Notes (Signed)
Pt ambulated on room air length of hallway. Room Air O2 sats 98-99%. Heart rate 78-82. Pt with " I feel a little short of breath and my heart feels a little funny at times like it skips" Ambulated back to room and resting in recliner. Maintain current plan of care for Pt.

## 2016-05-02 NOTE — Progress Notes (Signed)
**  Preliminary report by tech**  Right lower extremity venous duplex complete. There is no evidence of deep or superficial vein thrombosis involving the right lower extremity. All visualized vessels appear patent and compressible. There is no evidence of a Baker's cyst on the right.  05/02/16 4:18 PM Miranda [name] RVT

## 2016-05-02 NOTE — Progress Notes (Signed)
ANTICOAGULATION CONSULT NOTE -   Pharmacy Consult for IV heparin to rivaroxaban Indication: pulmonary embolism  Allergies  Allergen Reactions  . Fish Oil Anaphylaxis  . Other Hives, Shortness Of Breath and Swelling    Allergic to cashew nuts and peanut oil.  Marland Kitchen Penicillins Anaphylaxis, Hives and Swelling    Has patient had a PCN reaction causing immediate rash, facial/tongue/throat swelling, SOB or lightheadedness with hypotension: Face swelling and hives started first, then swelling of the throat  Has patient had a PCN reaction causing severe rash involving mucus membranes or skin necrosis: Yes  Has patient had a PCN reaction that required hospitalization: No  Has patient had a PCN reaction occurring within the last 10 years: Yes  If all of the above answers are "NO", then may proceed with Cephalospor  . Pneumococcal Vaccines Nausea And Vomiting  . Aspirin Itching and Rash  . Bactrim [Sulfamethoxazole-Trimethoprim] Hives, Itching and Rash  . Ciprofloxacin Hives, Itching and Rash  . Ibuprofen Rash  . Influenza Vaccines Hives  . Ivp Dye [Iodinated Diagnostic Agents] Hives, Itching and Rash    Gives benadryl to counteract symptoms  . Latex Rash  . Macrobid [Nitrofurantoin Monohydrate Macrocrystals] Hives  . Shellfish Allergy Hives    Patient also allergic to seafood    Patient Measurements: Height: 5\' 5"  (165.1 cm) Weight: 205 lb (93 kg) IBW/kg (Calculated) : 57 Heparin Dosing Weight: 77.8 kg  Vital Signs: Temp: 98.3 F (36.8 C) (02/17 0439) Temp Source: Oral (02/17 0439) BP: 150/64 (02/17 0439) Pulse Rate: 56 (02/17 0439)  Labs:  Recent Labs  05/01/16 2046 05/01/16 2157 05/02/16 0545  HGB 13.6 13.3 13.4  HCT 40.0 39.6 40.4  PLT  --  148* 155  APTT  --  24  --   LABPROT  --  14.2  --   INR  --  1.10  --   HEPARINUNFRC  --   --  1.20*  CREATININE 0.80  --  0.84  TROPONINI  --   --  <0.03    Estimated Creatinine Clearance: 71.2 mL/min (by C-G formula based on  SCr of 0.84 mg/dL).   Medical History: Past Medical History:  Diagnosis Date  . Anxiety   . Asthma   . CHF (congestive heart failure) ([employer])    pt. unsure- but thinks she was hosp. for CHF- 2002  . [facility] disease    [facility]  . Colon polyps   . Complication of anesthesia    states requires a lot med. to put her to sleep   . DDD (degenerative disc disease) 09/17/2011  . Depression    "sometimes "  . Family history of anesthesia complication   . GERD (gastroesophageal reflux disease)   . Glaucoma    bilateral, pt. admits that she is noncompliant to eye gtts.   . Hemorrhoids   . Hyperlipidemia   . Hypertension    had stress, echo- 2006 /w [place], Cardiac Cath, per pt. 2002, echo repeated 2012- wnl   . Low back pain   . Shortness of breath   . Sleep apnea    uses c-pap- q night recently     Assessment: 31 y/oF seen at pulmonology office yesterday for SOB with exertion. D-Dimer elevated at 3.17, so was sent to ED for evaluation. CTa of chest + for PE. Pharmacy consulted to assist with dosing of heparin infusion. Patient not on any anticoagulants PTA. Baseline aPTT, PT/INR WNL. H/H WNL, Pltc slightly low at 148K.  05/02/2016  HL 1.2 supratherapeutic H/H WNL Plts WNL No bleeding per RN  Goal of Therapy:  Heparin level 0.3-0.7 units/ml Monitor platelets by anticoagulation protocol: Yes   Plan:   Orders to transition to xarelto (renal fx and CBC acceptable and no drug interactions)  xarelto 15mg  PO BIDWC x 21 days then 20mg  daily with supper  Stop heparin gtt at time 1st xarelto dose given  Pharmacist to provide education and discount card   Monitor for bleeding  Doreene Eland, PharmD, BCPS.   Pager: [phone] 05/02/2016 10:03 AM

## 2016-05-02 NOTE — Care Management Note (Addendum)
Case Management Note  Patient Details  Name: DUANA BENEDICT MRN: [id number] Date of Birth: [birth year]  Subjective/Objective:    Acute PE                Action/Plan: Discharge Planning: NCM spoke to pt and dtr, Penny at bedside. Pt has Xarelto 30 day free trial card. Contacted [facility] on [address] to check copay. Pharmacy will check benefit and give NCM a call back. Will follow up on 2/18 for copay price.   PCP Marletta Lor MD   Pt's Xarelto will be $8.20. She will have to pick up start kit for Xarelto at [place] and they do have in stock. Provided pt with address.    Expected Discharge Date:                  Expected Discharge Plan:  Home/Self Care  In-House Referral:  NA  Discharge planning Services  CM Consult, Medication Assistance  Post Acute Care Choice:  NA Choice offered to:  NA  DME Arranged:  N/A DME Agency:  NA  HH Arranged:  NA HH Agency:  NA  Status of Service:  Completed, signed off  If discussed at [place] of Stay Meetings, dates discussed:    Additional Comments:  Erenest Rasher, RN 05/02/2016, 6:56 PM

## 2016-05-02 NOTE — Progress Notes (Signed)
ANTICOAGULATION CONSULT NOTE -   Pharmacy Consult for IV heparin Indication: pulmonary embolism  Allergies  Allergen Reactions  . Fish Oil Anaphylaxis  . Other Hives, Shortness Of Breath and Swelling    Allergic to cashew nuts and peanut oil.  Marland Kitchen Penicillins Anaphylaxis, Hives and Swelling    Has patient had a PCN reaction causing immediate rash, facial/tongue/throat swelling, SOB or lightheadedness with hypotension: Face swelling and hives started first, then swelling of the throat  Has patient had a PCN reaction causing severe rash involving mucus membranes or skin necrosis: Yes  Has patient had a PCN reaction that required hospitalization: No  Has patient had a PCN reaction occurring within the last 10 years: Yes  If all of the above answers are "NO", then may proceed with Cephalospor  . Pneumococcal Vaccines Nausea And Vomiting  . Aspirin Itching and Rash  . Bactrim [Sulfamethoxazole-Trimethoprim] Hives, Itching and Rash  . Ciprofloxacin Hives, Itching and Rash  . Ibuprofen Rash  . Influenza Vaccines Hives  . Ivp Dye [Iodinated Diagnostic Agents] Hives, Itching and Rash    Gives benadryl to counteract symptoms  . Latex Rash  . Macrobid [Nitrofurantoin Monohydrate Macrocrystals] Hives  . Shellfish Allergy Hives    Patient also allergic to seafood    Patient Measurements: Height: 5\' 5"  (165.1 cm) Weight: 205 lb (93 kg) IBW/kg (Calculated) : 57 Heparin Dosing Weight: 77.8 kg  Vital Signs: Temp: 98.3 F (36.8 C) (02/17 0439) Temp Source: Oral (02/17 0439) BP: 150/64 (02/17 0439) Pulse Rate: 56 (02/17 0439)  Labs:  Recent Labs  05/01/16 2046 05/01/16 2157 05/02/16 0545  HGB 13.6 13.3 13.4  HCT 40.0 39.6 40.4  PLT  --  148* 155  APTT  --  24  --   LABPROT  --  14.2  --   INR  --  1.10  --   HEPARINUNFRC  --   --  1.20*  CREATININE 0.80  --  0.84  TROPONINI  --   --  <0.03    Estimated Creatinine Clearance: 71.2 mL/min (by C-G formula based on SCr of 0.84  mg/dL).   Medical History: Past Medical History:  Diagnosis Date  . Anxiety   . Asthma   . CHF (congestive heart failure) ([place])    pt. unsure- but thinks she was hosp. for CHF- 2002  . Chronic kidney disease    [facility]  . Colon polyps   . Complication of anesthesia    states requires a lot med. to put her to sleep   . DDD (degenerative disc disease) 09/17/2011  . Depression    "sometimes "  . Family history of anesthesia complication   . GERD (gastroesophageal reflux disease)   . Glaucoma    bilateral, pt. admits that she is noncompliant to eye gtts.   . Hemorrhoids   . Hyperlipidemia   . Hypertension    had stress, echo- 2006 /w [place], Cardiac Cath, per pt. 2002, echo repeated 2012- wnl   . Low back pain   . Shortness of breath   . Sleep apnea    uses c-pap- q night recently     Assessment: 24 y/oF seen at pulmonology office yesterday for SOB with exertion. D-Dimer elevated at 3.17, so was sent to ED for evaluation. CTa of chest + for PE. Pharmacy consulted to assist with dosing of heparin infusion. Patient not on any anticoagulants PTA. Baseline aPTT, PT/INR WNL. H/H WNL, Pltc slightly low at 148K.  05/02/2016 HL 1.2  supratherapeutic H/H WNL Plts WNL No bleeding per RN  Goal of Therapy:  Heparin level 0.3-0.7 units/ml Monitor platelets by anticoagulation protocol: Yes   Plan:  Hold heparin drip for 1 hour then restart at 1100 units/hr Heparin level in 6 hours  Daily heparin level and CBC. Monitor closely for s/sx of bleeding.   Dolly Rias RPh 05/02/2016, 7:20 AM Pager [phone]

## 2016-05-02 NOTE — Progress Notes (Signed)
Miranda [name] [id number] DOB: May 09, 1945 DOA: 05/01/2016 PCP: Nyoka Cowden, MD  Brief narrative:  71 y/o ? ILD Prior Atypical PNA [mucor] 03/2015 OSA not on CPAP  Previous cardiac cath 05/22/2004 no disease Asthma Htn Anemia Bipolar CHR LBP GERD S/p fissurectomy and sphincterotomy and ext hemorrhoidetomy 10/2011  Colonic polpys/p colonoscopy 04/2010 Dr. Deatra Ina [prior at [place]] Prior episodic pyelonephritis 09/2011   Ed visit 04/17/16-SOB--patient thought asthma, had some Chest tightness--felt cardiac origin-left AMA.  retrurned to both PCP and finally seen at [facility] office 04/30/16--worked up D dimer +  Found to have Acute PE Started IV heparin Pendiong are echo as well as DVT study    Past medical history-As per Problem list Chart reviewed as below-   Consultants:  none  Procedures:  none  Antibiotics:  none   Subjective   Awake alert SOB and having CP Not comfortable Otherwise fair   Objective    Interim History:   Telemetry:    Objective: Vitals:   05/01/16 1942 05/01/16 2148 05/01/16 2243 05/02/16 0439  BP: 174/73  (!) 171/76 (!) 150/64  Pulse: (!) 50  (!) 53 (!) 56  Resp: 18  18 18   Temp: 98.2 F (36.8 C)  97.7 F (36.5 C) 98.3 F (36.8 C)  TempSrc: Oral  Oral Oral  SpO2: 97%  97% 99%  Weight:  93 kg (205 lb) 93 kg (205 lb)   Height:  5\' 5"  (1.651 m) 5\' 5"  (1.651 m)     Intake/Output Summary (Last 24 hours) at 05/02/16 0754 Last data filed at 05/02/16 0600  Gross per 24 hour  Intake           102.92 ml  Output                0 ml  Net           102.92 ml    Exam:  General: eomi ncat Cardiovascular: s1 s 2no m/r/g Respiratory: clear no wheeze   Data Reviewed: Basic Metabolic Panel:  Recent Labs Lab 05/01/16 2046 05/02/16 0545  NA 141 139  K 3.9 4.3  CL 103 105  CO2  --  27  GLUCOSE 111* 153*  BUN 13 14  CREATININE 0.80 0.84  CALCIUM  --  9.7   Liver Function Tests:  Recent Labs Lab  05/02/16 0545  AST 21  ALT 20  ALKPHOS 52  BILITOT 0.6  PROT 7.8  ALBUMIN 4.1   No results for input(s): LIPASE, AMYLASE in the last 168 hours. No results for input(s): AMMONIA in the last 168 hours. CBC:  Recent Labs Lab 05/01/16 2046 05/01/16 2157 05/02/16 0545  WBC  --  4.6 5.8  HGB 13.6 13.3 13.4  HCT 40.0 39.6 40.4  MCV  --  83.7 84.0  PLT  --  148* 155   Cardiac Enzymes:  Recent Labs Lab 05/02/16 0545  TROPONINI <0.03   BNP: Invalid input(s): POCBNP CBG: No results for input(s): GLUCAP in the last 168 hours.  No results found for this or any previous visit (from the past 240 hour(s)).   Studies:              All Imaging reviewed and is as per above notation   Scheduled Meds: . amLODipine  10 mg Oral Daily  . escitalopram  5 mg Oral Daily  . furosemide  20 mg Oral Daily  . ipratropium-albuterol  3 mL Nebulization QID  . linaclotide  290 mcg Oral Daily  .  losartan  50 mg Oral Daily  . metoprolol  25 mg Oral BID  . pravastatin  40 mg Oral q1800  . sodium chloride      . sodium chloride flush  3 mL Intravenous Q12H   Continuous Infusions: . heparin       Assessment/Plan:  1. PE-no Ct evidence of of compaction, Echo shows no RV strain-switch Heparin to Xarelto, sympotmatically manage.  Await Dopplers and ambulate-benefits check per CM who is aware 2. ILD-when I tell her she has a lung disease she seems surprised.  Will n3eed OP education 3. OSA not on CPAP-might need revisit as OP 4. Chr LBP-meds as per MAR-refills as OP wit PCP   Likely d/c home 2/18 with NOAC  Miranda Griffes, MD  [facility] Pager [phone] 05/02/2016, 7:54 AM    LOS: 0 days

## 2016-05-02 NOTE — Discharge Instructions (Signed)
Information on my medicine - XARELTO® (rivaroxaban) ° °This medication education was reviewed with me or my healthcare representative as part of my discharge preparation.  The pharmacist that spoke with me during my hospital stay was:  Latitia Housewright Z. PharmD °WHY WAS XARELTO® PRESCRIBED FOR YOU? °Xarelto® was prescribed to treat blood clots that may have been found in the veins of your legs (deep vein thrombosis) or in your lungs (pulmonary embolism) and to reduce the risk of them occurring again. ° °What do you need to know about Xarelto®? °The starting dose is one 15 mg tablet taken TWICE daily with food for the FIRST 21 DAYS then on March 10th the dose is changed to one 20 mg tablet taken ONCE A DAY with your evening meal. ° °DO NOT stop taking Xarelto® without talking to the health care provider who prescribed the medication.  Refill your prescription for 20 mg tablets before you run out. ° °After discharge, you should have regular check-up appointments with your healthcare provider that is prescribing your Xarelto®.  In the future your dose may need to be changed if your kidney function changes by a significant amount. ° °What do you do if you miss a dose? °If you are taking Xarelto® TWICE DAILY and you miss a dose, take it as soon as you remember. You may take two 15 mg tablets (total 30 mg) at the same time then resume your regularly scheduled 15 mg twice daily the next day. ° °If you are taking Xarelto® ONCE DAILY and you miss a dose, take it as soon as you remember on the same day then continue your regularly scheduled once daily regimen the next day. Do not take two doses of Xarelto® at the same time.  ° °Important Safety Information °Xarelto® is a blood thinner medicine that can cause bleeding. You should call your healthcare provider right away if you experience any of the following: °? Bleeding from an injury or your nose that does not stop. °? Unusual colored urine (red or dark brown) or unusual colored  stools (red or black). °? Unusual bruising for unknown reasons. °? A serious fall or if you hit your head (even if there is no bleeding). ° °Some medicines may interact with Xarelto® and might increase your risk of bleeding while on Xarelto®. To help avoid this, consult your healthcare provider or pharmacist prior to using any new prescription or non-prescription medications, including herbals, vitamins, non-steroidal anti-inflammatory drugs (NSAIDs) and supplements. ° °This website has more information on Xarelto®: www.xarelto.com. °

## 2016-05-02 NOTE — Progress Notes (Signed)
  Echocardiogram 2D Echocardiogram has been performed.  Darlina Sicilian M 05/02/2016, 9:36 AM

## 2016-05-02 NOTE — Progress Notes (Signed)
SATURATION QUALIFICATIONS: (This note is used to comply with regulatory documentation for home oxygen)  Patient Saturations on Room Air at Rest = 99  Patient Saturations on Room Air while Ambulating = 98  Patient Saturation Ambulating   Please briefly explain why patient needs home oxygen:

## 2016-05-03 DIAGNOSIS — Z91041 Radiographic dye allergy status: Secondary | ICD-10-CM | POA: Diagnosis not present

## 2016-05-03 DIAGNOSIS — I10 Essential (primary) hypertension: Secondary | ICD-10-CM | POA: Diagnosis not present

## 2016-05-03 DIAGNOSIS — G4733 Obstructive sleep apnea (adult) (pediatric): Secondary | ICD-10-CM | POA: Diagnosis not present

## 2016-05-03 DIAGNOSIS — Z803 Family history of malignant neoplasm of breast: Secondary | ICD-10-CM | POA: Diagnosis not present

## 2016-05-03 DIAGNOSIS — J45909 Unspecified asthma, uncomplicated: Secondary | ICD-10-CM | POA: Diagnosis not present

## 2016-05-03 DIAGNOSIS — K219 Gastro-esophageal reflux disease without esophagitis: Secondary | ICD-10-CM | POA: Diagnosis not present

## 2016-05-03 DIAGNOSIS — Z9119 Patient's noncompliance with other medical treatment and regimen: Secondary | ICD-10-CM | POA: Diagnosis not present

## 2016-05-03 DIAGNOSIS — I2699 Other pulmonary embolism without acute cor pulmonale: Secondary | ICD-10-CM | POA: Diagnosis not present

## 2016-05-03 DIAGNOSIS — Z8249 Family history of ischemic heart disease and other diseases of the circulatory system: Secondary | ICD-10-CM | POA: Diagnosis not present

## 2016-05-03 DIAGNOSIS — Z79899 Other long term (current) drug therapy: Secondary | ICD-10-CM | POA: Diagnosis not present

## 2016-05-03 DIAGNOSIS — Z825 Family history of asthma and other chronic lower respiratory diseases: Secondary | ICD-10-CM | POA: Diagnosis not present

## 2016-05-03 DIAGNOSIS — J849 Interstitial pulmonary disease, unspecified: Secondary | ICD-10-CM | POA: Diagnosis not present

## 2016-05-03 DIAGNOSIS — E785 Hyperlipidemia, unspecified: Secondary | ICD-10-CM | POA: Diagnosis not present

## 2016-05-03 DIAGNOSIS — H409 Unspecified glaucoma: Secondary | ICD-10-CM | POA: Diagnosis not present

## 2016-05-03 DIAGNOSIS — R001 Bradycardia, unspecified: Secondary | ICD-10-CM | POA: Diagnosis not present

## 2016-05-03 LAB — CBC
HEMATOCRIT: 38.2 % (ref 36.0–46.0)
HEMOGLOBIN: 12.4 g/dL (ref 12.0–15.0)
MCH: 27.6 pg (ref 26.0–34.0)
MCHC: 32.5 g/dL (ref 30.0–36.0)
MCV: 85.1 fL (ref 78.0–100.0)
PLATELETS: 152 10*3/uL (ref 150–400)
RBC: 4.49 MIL/uL (ref 3.87–5.11)
RDW: 14.3 % (ref 11.5–15.5)
WBC: 8.9 10*3/uL (ref 4.0–10.5)

## 2016-05-03 MED ORDER — RIVAROXABAN (XARELTO) VTE STARTER PACK (15 & 20 MG): ORAL_TABLET | ORAL | 0 refills | Status: DC

## 2016-05-03 MED ORDER — METOPROLOL TARTRATE 25 MG PO TABS: 12.5000 mg | ORAL_TABLET | Freq: Two times a day (BID) | ORAL | 1 refills | Status: DC

## 2016-05-03 MED ORDER — METOPROLOL TARTRATE 25 MG PO TABS: 12.5000 mg | ORAL_TABLET | Freq: Two times a day (BID) | ORAL | Status: DC

## 2016-05-03 NOTE — Progress Notes (Signed)
Reviewed discharge information with patient. Answered all questions. patientable to teach back medications and reasons to contact MD/911. Patient verbalizes importance of PCP follow up appointment and pulmonologist appt. Patient has coupon for trail of xarelto.  Miranda [name]. Brigitte Pulse, RN

## 2016-05-03 NOTE — Progress Notes (Signed)
While asleep pt HR mostly in the 40's and at times in the 50's.  Will return to 60's when awakened.

## 2016-05-03 NOTE — Discharge Summary (Signed)
Physician Discharge Summary  Miranda [name] [id number] DOB: July 11, 1945 DOA: 05/01/2016  PCP: Nyoka Cowden, MD  Admit date: 05/01/2016 Discharge date: 05/03/2016  Time spent: 45 minutes  Recommendations for Outpatient Follow-up:  1. Will need Blood with the anticoagulants for the next 6 months and is advised to follow with primary physician for CBC in 1 week 2. Stop Naprosyn as might cause or potentiate GI bleed 3. Does not recall ever being told about ILD and do think that she needs to have a discussion with her pulmonologist about this-we did give her handouts to explain what that would.today 4. Cut back metoprolol from 25-12.5 twice a day given some bradycardia  Discharge Diagnoses:  Principal Problem:   Acute pulmonary embolism (HCC) Active Problems:   HTN (hypertension)   Right leg pain   ILD (interstitial lung disease) (HCC)   Bradycardia   Pulmonary emboli [facility])   Discharge Condition: Improved  Diet recommendation: Heart healthy low-salt  Filed Weights   05/01/16 1803 05/01/16 2148 05/01/16 2243  Weight: 93 kg (205 lb) 93 kg (205 lb) 93 kg (205 lb)    History of present illness:  71 y/o ? ILD Prior Atypical PNA [mucor] 03/2015 OSA not on CPAP Previous cardiac cath 05/22/2004 no disease Asthma Htn Anemia Bipolar CHR LBP GERD S/p fissurectomy and sphincterotomy and ext hemorrhoidetomy 10/2011             Colonic polpys/p colonoscopy 04/2010 Dr. Deatra Ina [prior at [place]] Prior episodic pyelonephritis 09/2011   Ed visit 04/17/16-SOB--patient thought asthma, had some Chest tightness--felt cardiac origin-left AMA.  retrurned to both PCP and finally seen at [facility] office 04/30/16--worked up D dimer +  Found to have Acute PE Started IV heparin Echocardiogram as well as lower extremity Dopplers were benign and did not show any specific issues in regards to clots and her echocardiogram did not show any RV strain  During this admission heart rate dropped  while sleeping into the 30s to 40s. Just downward her metoprolol dosing to 12.5 3 times a day She was given a Xarelto card for 1 month free Xarelto and should get anticoagulation for about 6 months-this can be tailored to patient and physician preference 3-9 months if needed Her respiratory issues were stable and it was felt she was stable for discharge chronic low back pain is followed by her primary physician and should be followed up as an outpatient for refills  Discharge Exam: Vitals:   05/03/16 0739 05/03/16 0923  BP:    Pulse: (!) 48 61  Resp: 16   Temp:      Alert pleasant oriented no apparent distress tolerating diet EOMI NCAT Abdomen is soft and nondistended No wheeze no rales No lower extremity edema  Discharge Instructions   Discharge Instructions    Diet - low sodium heart healthy    Complete by:  As directed    Discharge instructions    Complete by:  As directed    Please take the bnlood thinner as per instructions-you will need to take this at least for 6 months Follow up with your lung doctor for education regarding you lung illnesses Notify your MD regarding rectal bleeding or vomiting--sometimes this medication can cause some erosions or ulcers in your belly.  Take Tylenol, not naproxen for pains. We have cut back your metoprolol dosing as you might notice.  This can be re-evalauted by your regular MD Get some lab work in 1-2 weeks at primary Md office Best of luck  Increase activity slowly    Complete by:  As directed      Current Discharge Medication List    START taking these medications   Details  Rivaroxaban 15 & 20 MG TBPK Take as directed on package: Start with one 15mg  tablet by mouth twice a day with food. On Day 22, switch to one 20mg  tablet once a day with food. Qty: 51 each, Refills: 0      CONTINUE these medications which have CHANGED   Details  metoprolol tartrate (LOPRESSOR) 25 MG tablet Take 0.5 tablets (12.5 mg total) by mouth 2  (two) times daily. Qty: 60 tablet, Refills: 1      CONTINUE these medications which have NOT CHANGED   Details  albuterol (PROVENTIL HFA;VENTOLIN HFA) 108 (90 Base) MCG/ACT inhaler Inhale 2 puffs into the lungs every 6 (six) hours as needed. Qty: 1 Inhaler, Refills: 0    ALPRAZolam (XANAX) 0.25 MG tablet Take 1 tablet (0.25 mg total) by mouth 3 (three) times daily as needed for anxiety or sleep. Qty: 30 tablet, Refills: 5    amLODipine (NORVASC) 10 MG tablet Take 1 tablet (10 mg total) by mouth daily. Qty: 90 tablet, Refills: 1    benzonatate (TESSALON) 100 MG capsule Take 1 capsule (100 mg total) by mouth 3 (three) times daily as needed. Qty: 20 capsule, Refills: 0    cyclobenzaprine (FLEXERIL) 10 MG tablet Take 1 tablet (10 mg total) by mouth 3 (three) times daily as needed. Qty: 90 tablet, Refills: 1    escitalopram (LEXAPRO) 5 MG tablet Take 1 tablet (5 mg total) by mouth daily. Qty: 30 tablet, Refills: 0    Fluticasone-Salmeterol (ADVAIR) 250-50 MCG/DOSE AEPB Inhale 1 puff into the lungs every 12 (twelve) hours. Qty: 60 each, Refills: 5    furosemide (LASIX) 40 MG tablet Take 0.5 tablets (20 mg total) by mouth daily. Qty: 30 tablet, Refills: 0    HYDROcodone-acetaminophen (NORCO) 10-325 MG tablet Take 1 tablet by mouth every 6 (six) hours as needed. Qty: 90 tablet, Refills: 0    lactulose (CHRONULAC) 10 GM/15ML solution Take 30 mLs (20 g total) by mouth 3 (three) times daily as needed for mild constipation. Qty: 473 mL, Refills: 1    linaclotide (LINZESS) 290 MCG CAPS capsule Take 1 capsule (290 mcg total) by mouth daily. Qty: 30 capsule, Refills: 0    losartan (COZAAR) 50 MG tablet Take 1 tablet (50 mg total) by mouth daily. Qty: 30 tablet, Refills: 2    lovastatin (MEVACOR) 40 MG tablet Take 1 tablet (40 mg total) by mouth at bedtime. Qty: 180 tablet, Refills: 1      STOP taking these medications     naproxen sodium (ANAPROX) 220 MG tablet        Allergies   Allergen Reactions  . Fish Oil Anaphylaxis  . Other Hives, Shortness Of Breath and Swelling    Allergic to cashew nuts and peanut oil.  Marland Kitchen Penicillins Anaphylaxis, Hives and Swelling    Has patient had a PCN reaction causing immediate rash, facial/tongue/throat swelling, SOB or lightheadedness with hypotension: Face swelling and hives started first, then swelling of the throat  Has patient had a PCN reaction causing severe rash involving mucus membranes or skin necrosis: Yes  Has patient had a PCN reaction that required hospitalization: No  Has patient had a PCN reaction occurring within the last 10 years: Yes  If all of the above answers are "NO", then may proceed with Cephalospor  . Pneumococcal  Vaccines Nausea And Vomiting  . Aspirin Itching and Rash  . Bactrim [Sulfamethoxazole-Trimethoprim] Hives, Itching and Rash  . Ciprofloxacin Hives, Itching and Rash  . Ibuprofen Rash  . Influenza Vaccines Hives  . Ivp Dye [Iodinated Diagnostic Agents] Hives, Itching and Rash    Gives benadryl to counteract symptoms  . Latex Rash  . Macrobid [Nitrofurantoin Monohydrate Macrocrystals] Hives  . Shellfish Allergy Hives    Patient also allergic to seafood      The results of significant diagnostics from this hospitalization (including imaging, microbiology, ancillary and laboratory) are listed below for reference.    Significant Diagnostic Studies: Dg Chest 2 View  Result Date: 04/17/2016 CLINICAL DATA:  Shortness of breath EXAM: CHEST  2 VIEW COMPARISON:  CT chest 06/24/2015, chest x-ray 04/05/2015 FINDINGS: There is bilateral chronic interstitial thickening. There is no new focal parenchymal opacity. There is no focal parenchymal opacity. There is no pleural effusion or pneumothorax. The heart and mediastinal contours are unremarkable. The osseous structures are unremarkable. IMPRESSION: No active cardiopulmonary disease. Electronically Signed   By: Kathreen Devoid   On: 04/17/2016 12:41   Ct  Angio Chest Pe W And/or Wo Contrast  Result Date: 05/01/2016 CLINICAL DATA:  71 y/o  F; shortness of breath and elevated D-dimer. EXAM: CT ANGIOGRAPHY CHEST WITH CONTRAST TECHNIQUE: Multidetector CT imaging of the chest was performed using the standard protocol during bolus administration of intravenous contrast. Multiplanar CT image reconstructions and MIPs were obtained to evaluate the vascular anatomy. CONTRAST:  100 cc Isovue 370 COMPARISON:  06/24/2015 CT of the chest. FINDINGS: Cardiovascular: Satisfactory opacification of the pulmonary arteries to segmental level. Multiple upper lobe segmental pulmonary emboli (series 10 image 92 and 109). RV/LV ratio equals 0.86 Stable cardiomegaly. Mild coronary artery calcification. Normal size main pulmonary artery. Normal size thoracic aorta. Mild aortic atherosclerosis with calcification. Predominantly fibrofatty plaque of left subclavian artery origin with mild-to-moderate stenosis. Mediastinum/Nodes: Stable prominent mediastinal lymph nodes. Normal thyroid gland. Normal thoracic esophagus. Lungs/Pleura: Peripheral reticulation of the lung bases compatible pulmonary fibrosis better characterized on prior high-resolution CT of the chest. No consolidation, pleural effusion, or pneumothorax. Upper Abdomen: Negative. Musculoskeletal:  No acute osseous abnormality. Review of the MIP images confirms the above findings. IMPRESSION: 1. Positive for multiple segmental pulmonary emboli. No evidence for right heart strain. 2. Pulmonary fibrosis better characterized on prior high-resolution CT of chest. 3. Aortic atherosclerosis and coronary artery calcifications. Stable cardiomegaly. These results were called by telephone at the time of interpretation on 05/01/2016 at 9:21 pm to Dr. Varney Biles , who verbally acknowledged these results. Electronically Signed   By: Kristine Garbe M.D.   On: 05/01/2016 21:23    Microbiology: No results found for this or any  previous visit (from the past 240 hour(s)).   Labs: Basic Metabolic Panel:  Recent Labs Lab 05/01/16 2046 05/02/16 0545  NA 141 139  K 3.9 4.3  CL 103 105  CO2  --  27  GLUCOSE 111* 153*  BUN 13 14  CREATININE 0.80 0.84  CALCIUM  --  9.7   Liver Function Tests:  Recent Labs Lab 05/02/16 0545  AST 21  ALT 20  ALKPHOS 52  BILITOT 0.6  PROT 7.8  ALBUMIN 4.1   No results for input(s): LIPASE, AMYLASE in the last 168 hours. No results for input(s): AMMONIA in the last 168 hours. CBC:  Recent Labs Lab 05/01/16 2046 05/01/16 2157 05/02/16 0545 05/03/16 0534  WBC  --  4.6 5.8 8.9  HGB 13.6 13.3 13.4 12.4  HCT 40.0 39.6 40.4 38.2  MCV  --  83.7 84.0 85.1  PLT  --  148* 155 152   Cardiac Enzymes:  Recent Labs Lab 05/02/16 0545  TROPONINI <0.03   BNP: BNP (last 3 results) No results for input(s): BNP in the last 8760 hours.  ProBNP (last 3 results) No results for input(s): PROBNP in the last 8760 hours.  CBG: No results for input(s): GLUCAP in the last 168 hours.     SignedNita Sells MD   [facility] 05/03/2016, 10:04 AM

## 2016-05-05 ENCOUNTER — Telehealth: Payer: Self-pay

## 2016-05-05 NOTE — Telephone Encounter (Signed)
D/C 05/03/16 To: home  Spoke with pt and she states that she does have some fatigue but otherwise is doing well. She does not have any questions or concerns at this time. She is aware of all her pending appts: CT, PFT and Pulm.   Appt with Dr Maudie Mercury in Dr. Marthann Schiller absence 05/07/16. Pt aware.    Transition Care Management Follow-up Telephone Call  How have you been since you were released from the hospital? Tired, improving slowly   Do you understand why you were in the hospital? yes   Do you understand the discharge instrcutions? yes  Items Reviewed:  Medications reviewed: yes  Allergies reviewed: yes  Dietary changes reviewed: yes  Referrals reviewed: yes   Functional Questionnaire:   Activities of Daily Living (ADLs):   She states they are independent in the following: ambulation, bathing and hygiene, feeding, continence, grooming, toileting and dressing States they require assistance with the following: none   Any transportation issues/concerns?: no  Any patient concerns? No   Confirmed importance and date/time of follow-up visits scheduled: yes, reviewed all follow ups scheduled   Confirmed with patient if condition begins to worsen call PCP or go to the ER.  Patient was given the Call-a-Nurse line [phone]: yes

## 2016-05-06 NOTE — Progress Notes (Signed)
HPI:  Miranda [name] is a pleasant 71 patient of my colleague, Dr. Burnice Logan, here to see me today for a transitional care visit after a recent hospitalization. See phone note.  PMH significant for HTN, HLD, ILD (per pulm notes), OSA, asthma, anxiety and obesity. Recently hospitalized for a new pulmonary embolism. She is now on xarelto. She had some bradycardia in the hospital on review discharge documents and BB was decreased. Also her Naprosyn was held. She has follow up with both her pulmonologist and her PCP in a few weeks. Reports that she is feeling remarkably well considering. Still feels tired at time, but denies CP, SOB, palpitations, bleeding, dizziness or weakness. Her HR is a little low and when we reviewed her medications she actually was confused about the BB and has not been taking the lower 12.5mg  dose. She is aware of her pulmonology and PCP follow up appointments. She is concerned about how to get her Xarelto once her starter pack (1 month) runs out. Wants to check at various pharmacies.  ROS: See pertinent positives and negatives per HPI.  Past Medical History:  Diagnosis Date  . Anxiety   . Asthma   . CHF (congestive heart failure) ([place])    pt. unsure- but thinks she was hosp. for CHF- 2002  . [facility]  . Colon polyps   . Complication of anesthesia    states requires a lot med. to put her to sleep   . DDD (degenerative disc disease) 09/17/2011  . Depression    "sometimes "  . Family history of anesthesia complication   . GERD (gastroesophageal reflux disease)   . Glaucoma    bilateral, pt. admits that she is noncompliant to eye gtts.   . Hemorrhoids   . Hyperlipidemia   . Hypertension    had stress, echo- 2006 /w [place], Cardiac Cath, per pt. 2002, echo repeated 2012- [facility]   . Low back pain   . Shortness of breath   . Sleep apnea    uses c-pap- q night recently    Past Surgical History:  Procedure Laterality Date  .  ABDOMINAL HYSTERECTOMY     ectopic, fibroids  . CARDIAC CATHETERIZATION    . COLONOSCOPY     remote  . EYE SURGERY     cataracts removed bilateral- ?IOL  . FISSURECTOMY  10/08/2011   Procedure: FISSURECTOMY;  Surgeon: Stark Klein, MD;  Location: [place];  Service: General;  Laterality: N/A;  . FLEXIBLE SIGMOIDOSCOPY  02/25/2011   Procedure: FLEXIBLE SIGMOIDOSCOPY;  Surgeon: Inda Castle, MD;  Location: [facility];  Service: Endoscopy;  Laterality: N/A;  . FOOT SURGERY     bilat, heel spurs- screw in R foot   . HEMORRHOID SURGERY  10/08/2011   Procedure: HEMORRHOIDECTOMY;  Surgeon: Stark Klein, MD;  Location: [place];  Service: General;  Laterality: N/A;  External   . SPHINCTEROTOMY  10/08/2011   Procedure: Joan Mayans;  Surgeon: Stark Klein, MD;  Location: [facility];  Service: General;  Laterality: N/A;    Family History  Problem Relation Age of Onset  . Cancer Mother     breast  . Heart attack Mother   . Heart disease Mother   . Cancer Other     breast, colon, lung  . Heart disease Other   . Emphysema Sister   . Cancer Sister     breast  . Asthma Sister   . Heart disease Sister   .  Rheum arthritis Sister   . Cancer Brother     colon  . Anesthesia problems Neg Hx     Social History   Social History  . Marital status: Married    Spouse name: N/A  . Number of children: N/A  . Years of education: N/A   Occupational History  . retired Quarry manager Unemployed   Social History Main Topics  . Smoking status: Never Smoker  . Smokeless tobacco: Never Used  . Alcohol use No  . Drug use: No  . Sexual activity: No   Other Topics Concern  . None   Social History Narrative  . None     Current Outpatient Prescriptions:  .  albuterol (PROVENTIL HFA;VENTOLIN HFA) 108 (90 Base) MCG/ACT inhaler, Inhale 2 puffs into the lungs every 6 (six) hours as needed., Disp: 1 Inhaler, Rfl: 0 .  ALPRAZolam (XANAX) 0.25 MG tablet, Take 1 tablet (0.25 mg total) by mouth 3 (three) times daily  as needed for anxiety or sleep., Disp: 30 tablet, Rfl: 5 .  amLODipine (NORVASC) 10 MG tablet, Take 1 tablet (10 mg total) by mouth daily., Disp: 90 tablet, Rfl: 1 .  benzonatate (TESSALON) 100 MG capsule, Take 1 capsule (100 mg total) by mouth 3 (three) times daily as needed., Disp: 20 capsule, Rfl: 0 .  cyclobenzaprine (FLEXERIL) 10 MG tablet, Take 1 tablet (10 mg total) by mouth 3 (three) times daily as needed. (Patient taking differently: Take 10 mg by mouth 3 (three) times daily as needed for muscle spasms. ), Disp: 90 tablet, Rfl: 1 .  escitalopram (LEXAPRO) 5 MG tablet, Take 1 tablet (5 mg total) by mouth daily., Disp: 30 tablet, Rfl: 0 .  Fluticasone-Salmeterol (ADVAIR) 250-50 MCG/DOSE AEPB, Inhale 1 puff into the lungs every 12 (twelve) hours., Disp: 60 each, Rfl: 5 .  furosemide (LASIX) 40 MG tablet, Take 0.5 tablets (20 mg total) by mouth daily., Disp: 30 tablet, Rfl: 0 .  HYDROcodone-acetaminophen (NORCO) 10-325 MG tablet, Take 1 tablet by mouth every 6 (six) hours as needed., Disp: 90 tablet, Rfl: 0 .  lactulose (CHRONULAC) 10 GM/15ML solution, Take 30 mLs (20 g total) by mouth 3 (three) times daily as needed for mild constipation., Disp: 473 mL, Rfl: 1 .  linaclotide (LINZESS) 290 MCG CAPS capsule, Take 1 capsule (290 mcg total) by mouth daily., Disp: 30 capsule, Rfl: 0 .  losartan (COZAAR) 50 MG tablet, Take 1 tablet (50 mg total) by mouth daily., Disp: 30 tablet, Rfl: 2 .  lovastatin (MEVACOR) 40 MG tablet, Take 1 tablet (40 mg total) by mouth at bedtime., Disp: 180 tablet, Rfl: 1 .  metoprolol tartrate (LOPRESSOR) 25 MG tablet, Take 0.5 tablets (12.5 mg total) by mouth 2 (two) times daily., Disp: 60 tablet, Rfl: 1 .  Rivaroxaban 15 & 20 MG TBPK, Take as directed on package: Start with one 15mg  tablet by mouth twice a day with food. On Day 22, switch to one 20mg  tablet once a day with food., Disp: 51 each, Rfl: 0 .  rivaroxaban (XARELTO) 20 MG TABS tablet, Take 1 tablet (20 mg total) by  mouth daily with supper., Disp: 30 tablet, Rfl: 0  EXAM:  Vitals:   05/07/16 1049  BP: 122/68  Pulse: (!) 52  Temp: 98.9 F (37.2 C)    Body mass index is 33.15 kg/m.  GENERAL: vitals reviewed and listed above, alert, oriented, appears well hydrated and in no acute distress  HEENT: atraumatic, conjunttiva clear, no obvious abnormalities on inspection of  external nose and ears  NECK: no obvious masses on inspection  LUNGS: clear to auscultation bilaterally, no wheezes, rales or rhonchi, good air movement  CV: HRRR, no peripheral edema  MS: moves all extremities without noticeable abnormality  PSYCH: pleasant and cooperative, no obvious depression or anxiety  ASSESSMENT AND PLAN:  Discussed the following assessment and plan:  Other pulmonary embolism without acute cor pulmonale, unspecified chronicity (HCC)  Essential hypertension  Bradycardia  ILD (interstitial lung disease) ([place])  -seems to be doing remarkably well considering recent events -has PCP and pulmonology follow up -tolerating xarelto well and plans to do labs at physical - I gave her a 30 day printed Rx so that she will not run out before her next appt with her PCP and pulmonologist and advised she call immediately if any difficulty obtaining this medication -she had not cut her BB in 1/2 - but now understands to do so -Patient advised to return or notify a doctor immediately if symptoms worsen or persist or new concerns arise.  Patient Instructions  Keep your follow up with Dr. Burnice Logan and with your pulmonologist as planned.  Let your doctor know immediately if you have any difficulty obtaining or affording the Xarelto.  Make sure you take only 12.5 mg (1 half tablet) of the Metoprolol   Pulmonary Embolism A pulmonary embolism (PE) is a sudden blockage or decrease of blood flow in one lung or both lungs. Most blockages come from a blood clot that travels from the legs or the pelvis to the lungs.  PE is a dangerous and potentially life-threatening condition if it is not treated right away. What are the causes? A pulmonary embolism occurs most commonly when a blood clot travels from one of your veins to your lungs. Rarely, PE is caused by air, fat, amniotic fluid, or part of a tumor traveling through your veins to your lungs. What increases the risk? A PE is more likely to develop in:  People who smoke.  People who areolder, especially over 26 years of age.  People who are overweight (obese).  People who sit or lie still for a long time, such as during long-distance travel (over 4 hours), bed rest, hospitalization, or during recovery from certain medical conditions like a stroke.  People who do not engage in much physical activity (sedentary lifestyle).  People who have chronic breathing disorders.  People whohave a personal or family history of blood clots or blood clotting disease.  People whohave peripheral vascular disease (PVD), diabetes, or some types of cancer.  People who haveheart disease, especially if the person had a recent heart attack or has congestive heart failure.  People who have neurological diseases that affect the legs (leg paresis).  People who have had a traumatic injury, such as breaking a hip or leg.  People whohave recently had major or lengthy surgery, especially on the hip, knee, or abdomen.  People who have hada central line placed inside a large vein.  People who takemedicines that contain the hormone estrogen. These include birth control pills and hormone replacement therapy.  Pregnancy or during childbirth or the postpartum period. What are the signs or symptoms? The symptoms of a PE usually start suddenly and include:  Shortness of breath while active or at rest.  Coughing or coughing up blood or blood-tinged mucus.  Chest pain that is often worse with deep breaths.  Rapid or irregular heartbeat.  Feeling light-headed or  dizzy.  Fainting.  Feelinganxious.  Sweating. There may  also be pain and swelling in a leg if that is where the blood clot started. These symptoms may represent a serious problem that is an emergency. Do not wait to see if the symptoms will go away. Get medical help right away. Call your local emergency services (911 in the [place] Do not drive yourself to the hospital.  How is this diagnosed? Your health care provider will take a medical history and perform a physical exam. You may also have other tests, including:  Blood tests to assess the clotting properties of your blood, assess oxygen levels in your blood, and find blood clots.  Imaging tests, such as CT, ultrasound, MRI, X-ray, and other tests to see if you have clots anywhere in your body.  An electrocardiogram (ECG) to look for heart strain from blood clots in the lungs. How is this treated? The main goals of PE treatment are:  To stop a blood clot from growing larger.  To stop new blood clots from forming. The type of treatment that you receive depends on many factors, such as the cause of your PE, your risk for bleeding or developing more clots, and other medical conditions that you have. Sometimes, a combination of treatments is necessary. This condition may be treated with:  Medicines, including newer oral blood thinners (anticoagulants), warfarin, low molecular weight heparins, thrombolytics, or heparins.  Wearing compression stockings or using different types of devices.  Surgery (rare) to remove the blood clot or to place a filter in your abdomen to stop the blood clot from traveling to your lungs. Treatments for a PE are often divided into immediate treatment, long-term treatment (up to 3 months after PE), and extended treatment (more than 3 months after PE). Your treatment may continue for several months. This is called maintenance therapy, and it is used to prevent the forming of new blood clots. You can work with  your health care provider to choose the treatment program that is best for you. What are anticoagulants?  Anticoagulants are medicines that treat PEs. They can stop current blood clots from growing and stop new clots from forming. They cannot dissolve existing clots. Your body dissolves clots by itself over time. Anticoagulants are given by mouth, by injection, or through an IV tube. What are thrombolytics?  Thrombolytics are clot-dissolving medicines that are used to dissolve a PE. They carry a high risk of bleeding, so they tend to be used only in severe cases or if you have very low blood pressure. Follow these instructions at home: If you are taking a newer oral anticoagulant:  Take the medicine every single day at the same time each day.  Understand what foods and drugs interact with this medicine.  Understand that there are no regular blood tests required when using this medicine.  Understandthe side effects of this medicine, including excessive bruising or bleeding. Ask your health care provider or pharmacist about other possible side effects. If you are taking warfarin:  Understand how to take warfarin and know which foods can affect how warfarin works in Veterinary surgeon.  Understand that it is dangerous to taketoo much or too little warfarin. Too much warfarin increases the risk of bleeding. Too little warfarin continues to allow the risk for blood clots.  Follow your PT and INR blood testing schedule. The PT and INR results allow your health care provider to adjust your dose of warfarin. It is very important that you have your PT and INR tested as often as told by your  health care provider.  Avoid major changes in your diet, or tell your health care provider before you change your diet. Arrange a visit with a registered dietitian to answer your questions. Many foods, especially foods that are high in vitamin K, can interfere with warfarin and affect the PT and INR results. Eat a  consistent amount of foods that are high in vitamin K, such as:  Spinach, kale, broccoli, cabbage, collard greens, turnip greens, Brussels sprouts, peas, cauliflower, seaweed, and parsley.  Beef liver and pork liver.  Green tea.  Soybean oil.  Tell your health care provider about any and all medicines, vitamins, and supplements that you take, including aspirin and other over-the-counter anti-inflammatory medicines. Be especially cautious with aspirin and anti-inflammatory medicines. Do not take those before you ask your health care provider if it is safe to do so. This is important because many medicines can interfere with warfarin and affect the PT and INR results.  Do not start or stop taking any over-the-counter or prescription medicine unless your health care provider or pharmacist tells you to do so. If you take warfarin, you will also need to do these things:  Hold pressure over cuts for longer than usual.  Tell your dentist and other health care providers that you are taking warfarin before you have any procedures in which bleeding may occur.  Avoid alcohol or drink very small amounts. Tell your health care provider if you change your alcohol intake.  Do not use tobacco products, including cigarettes, chewing tobacco, and e-cigarettes. If you need help quitting, ask your health care provider.  Avoid contact sports. General instructions  Take over-the-counter and prescription medicines only as told by your health care provider. Anticoagulant medicines can have side effects, including easy bruising and difficulty stopping bleeding. If you are prescribed an anticoagulant, you will also need to do these things:  Hold pressure over cuts for longer than usual.  Tell your dentist and other health care providers that you are taking anticoagulants before you have any procedures in which bleeding may occur.  Avoid contact sports.  Wear a medical alert bracelet or carry a medical alert  card that says you have had a PE.  Ask your health care provider how soon you can go back to your normal activities. Stay active to prevent new blood clots from forming.  Make sure to exercise while traveling or when you have been sitting or standing for a long period of time. It is very important to exercise. Exercise your legs by walking or by tightening and relaxing your leg muscles often. Take frequent walks.  Wear compression stockings as told by your health care provider to help prevent more blood clots from forming.  Do not use tobacco products, including cigarettes, chewing tobacco, and e-cigarettes. If you need help quitting, ask your health care provider.  Keep all follow-up appointments with your health care provider. This is important. How is this prevented? Take these actions to decrease your risk of developing another PE:  Exercise regularly. For at least 30 minutes every day, engage in:  Activity that involves moving your arms and legs.  Activity that encourages good blood flow through your body by increasing your heart rate.  Exercise your arms and legs every hour during long-distance travel (over 4 hours). Drink plenty of water and avoid drinking alcohol while traveling.  Avoid sitting or lying in bed for long periods of time without moving your legs.  Maintain a weight that is appropriate for your  height. Ask your health care provider what weight is healthy for you.  If you are a woman who is over 86 years of age, avoid unnecessary use of medicines that contain estrogen. These include birth control pills.  Do not smoke, especially if you take estrogen medicines. If you need help quitting, ask your health care provider.  If you are at very high risk for PE, wear compression stockings.  If you recently had a PE, have regularly scheduled ultrasound testing on your legs to check for new blood clots. If you are hospitalized, prevention measures may include:  Early  walking after surgery, as soon as your health care provider says that it is safe.  Receiving anticoagulants to prevent blood clots. If you cannot take anticoagulants, other options may be available, such as wearing compression stockings or using different types of devices. Get help right away if:  You have new or increased pain, swelling, or redness in an arm or leg.  You have numbness or tingling in an arm or leg.  You have shortness of breath while active or at rest.  You have chest pain.  You have a rapid or irregular heartbeat.  You feel light-headed or dizzy.  You cough up blood.  You notice blood in your vomit, bowel movement, or urine.  You have a fever. These symptoms may represent a serious problem that is an emergency. Do not wait to see if the symptoms will go away. Get medical help right away. Call your local emergency services (911 in the [place] Do not drive yourself to the hospital.  This information is not intended to replace advice given to you by your health care provider. Make sure you discuss any questions you have with your health care provider. Document Released: 02/28/2000 Document Revised: 08/08/2015 Document Reviewed: 06/27/2014 [employer]vier Interactive Patient Education  2017 [place]., DO

## 2016-05-07 ENCOUNTER — Encounter: Payer: Self-pay | Admitting: Family Medicine

## 2016-05-07 ENCOUNTER — Ambulatory Visit (INDEPENDENT_AMBULATORY_CARE_PROVIDER_SITE_OTHER): Payer: Medicare Other | Admitting: Family Medicine

## 2016-05-07 VITALS — BP 122/68 | HR 52 | Temp 98.9°F | Ht 65.0 in | Wt 199.2 lb

## 2016-05-07 DIAGNOSIS — R001 Bradycardia, unspecified: Secondary | ICD-10-CM

## 2016-05-07 DIAGNOSIS — I2699 Other pulmonary embolism without acute cor pulmonale: Secondary | ICD-10-CM

## 2016-05-07 DIAGNOSIS — I10 Essential (primary) hypertension: Secondary | ICD-10-CM | POA: Diagnosis not present

## 2016-05-07 DIAGNOSIS — J849 Interstitial pulmonary disease, unspecified: Secondary | ICD-10-CM

## 2016-05-07 MED ORDER — RIVAROXABAN 20 MG PO TABS: 20.0000 mg | ORAL_TABLET | Freq: Every day | ORAL | 0 refills | Status: DC

## 2016-05-07 NOTE — Progress Notes (Signed)
Pre visit review using our clinic review tool, if applicable. No additional management support is needed unless otherwise documented below in the visit note. 

## 2016-05-07 NOTE — Patient Instructions (Signed)
Keep your follow up with Dr. Burnice Logan and with your pulmonologist as planned.  Let your doctor know immediately if you have any difficulty obtaining or affording the Xarelto.  Make sure you take only 12.5 mg (1 half tablet) of the Metoprolol   Pulmonary Embolism A pulmonary embolism (PE) is a sudden blockage or decrease of blood flow in one lung or both lungs. Most blockages come from a blood clot that travels from the legs or the pelvis to the lungs. PE is a dangerous and potentially life-threatening condition if it is not treated right away. What are the causes? A pulmonary embolism occurs most commonly when a blood clot travels from one of your veins to your lungs. Rarely, PE is caused by air, fat, amniotic fluid, or part of a tumor traveling through your veins to your lungs. What increases the risk? A PE is more likely to develop in:  People who smoke.  People who areolder, especially over 74 years of age.  People who are overweight (obese).  People who sit or lie still for a long time, such as during long-distance travel (over 4 hours), bed rest, hospitalization, or during recovery from certain medical conditions like a stroke.  People who do not engage in much physical activity (sedentary lifestyle).  People who have chronic breathing disorders.  People whohave a personal or family history of blood clots or blood clotting disease.  People whohave peripheral vascular disease (PVD), diabetes, or some types of cancer.  People who haveheart disease, especially if the person had a recent heart attack or has congestive heart failure.  People who have neurological diseases that affect the legs (leg paresis).  People who have had a traumatic injury, such as breaking a hip or leg.  People whohave recently had major or lengthy surgery, especially on the hip, knee, or abdomen.  People who have hada central line placed inside a large vein.  People who takemedicines that  contain the hormone estrogen. These include birth control pills and hormone replacement therapy.  Pregnancy or during childbirth or the postpartum period. What are the signs or symptoms? The symptoms of a PE usually start suddenly and include:  Shortness of breath while active or at rest.  Coughing or coughing up blood or blood-tinged mucus.  Chest pain that is often worse with deep breaths.  Rapid or irregular heartbeat.  Feeling light-headed or dizzy.  Fainting.  Feelinganxious.  Sweating. There may also be pain and swelling in a leg if that is where the blood clot started. These symptoms may represent a serious problem that is an emergency. Do not wait to see if the symptoms will go away. Get medical help right away. Call your local emergency services (911 in the [place] Do not drive yourself to the hospital.  How is this diagnosed? Your health care provider will take a medical history and perform a physical exam. You may also have other tests, including:  Blood tests to assess the clotting properties of your blood, assess oxygen levels in your blood, and find blood clots.  Imaging tests, such as CT, ultrasound, MRI, X-ray, and other tests to see if you have clots anywhere in your body.  An electrocardiogram (ECG) to look for heart strain from blood clots in the lungs. How is this treated? The main goals of PE treatment are:  To stop a blood clot from growing larger.  To stop new blood clots from forming. The type of treatment that you receive depends on many factors,  such as the cause of your PE, your risk for bleeding or developing more clots, and other medical conditions that you have. Sometimes, a combination of treatments is necessary. This condition may be treated with:  Medicines, including newer oral blood thinners (anticoagulants), warfarin, low molecular weight heparins, thrombolytics, or heparins.  Wearing compression stockings or using different types of  devices.  Surgery (rare) to remove the blood clot or to place a filter in your abdomen to stop the blood clot from traveling to your lungs. Treatments for a PE are often divided into immediate treatment, long-term treatment (up to 3 months after PE), and extended treatment (more than 3 months after PE). Your treatment may continue for several months. This is called maintenance therapy, and it is used to prevent the forming of new blood clots. You can work with your health care provider to choose the treatment program that is best for you. What are anticoagulants?  Anticoagulants are medicines that treat PEs. They can stop current blood clots from growing and stop new clots from forming. They cannot dissolve existing clots. Your body dissolves clots by itself over time. Anticoagulants are given by mouth, by injection, or through an IV tube. What are thrombolytics?  Thrombolytics are clot-dissolving medicines that are used to dissolve a PE. They carry a high risk of bleeding, so they tend to be used only in severe cases or if you have very low blood pressure. Follow these instructions at home: If you are taking a newer oral anticoagulant:  Take the medicine every single day at the same time each day.  Understand what foods and drugs interact with this medicine.  Understand that there are no regular blood tests required when using this medicine.  Understandthe side effects of this medicine, including excessive bruising or bleeding. Ask your health care provider or pharmacist about other possible side effects. If you are taking warfarin:  Understand how to take warfarin and know which foods can affect how warfarin works in Veterinary surgeon.  Understand that it is dangerous to taketoo much or too little warfarin. Too much warfarin increases the risk of bleeding. Too little warfarin continues to allow the risk for blood clots.  Follow your PT and INR blood testing schedule. The PT and INR results allow  your health care provider to adjust your dose of warfarin. It is very important that you have your PT and INR tested as often as told by your health care provider.  Avoid major changes in your diet, or tell your health care provider before you change your diet. Arrange a visit with a registered dietitian to answer your questions. Many foods, especially foods that are high in vitamin K, can interfere with warfarin and affect the PT and INR results. Eat a consistent amount of foods that are high in vitamin K, such as:  Spinach, kale, broccoli, cabbage, collard greens, turnip greens, Brussels sprouts, peas, cauliflower, seaweed, and parsley.  Beef liver and pork liver.  Green tea.  Soybean oil.  Tell your health care provider about any and all medicines, vitamins, and supplements that you take, including aspirin and other over-the-counter anti-inflammatory medicines. Be especially cautious with aspirin and anti-inflammatory medicines. Do not take those before you ask your health care provider if it is safe to do so. This is important because many medicines can interfere with warfarin and affect the PT and INR results.  Do not start or stop taking any over-the-counter or prescription medicine unless your health care provider or pharmacist  tells you to do so. If you take warfarin, you will also need to do these things:  Hold pressure over cuts for longer than usual.  Tell your dentist and other health care providers that you are taking warfarin before you have any procedures in which bleeding may occur.  Avoid alcohol or drink very small amounts. Tell your health care provider if you change your alcohol intake.  Do not use tobacco products, including cigarettes, chewing tobacco, and e-cigarettes. If you need help quitting, ask your health care provider.  Avoid contact sports. General instructions  Take over-the-counter and prescription medicines only as told by your health care provider.  Anticoagulant medicines can have side effects, including easy bruising and difficulty stopping bleeding. If you are prescribed an anticoagulant, you will also need to do these things:  Hold pressure over cuts for longer than usual.  Tell your dentist and other health care providers that you are taking anticoagulants before you have any procedures in which bleeding may occur.  Avoid contact sports.  Wear a medical alert bracelet or carry a medical alert card that says you have had a PE.  Ask your health care provider how soon you can go back to your normal activities. Stay active to prevent new blood clots from forming.  Make sure to exercise while traveling or when you have been sitting or standing for a long period of time. It is very important to exercise. Exercise your legs by walking or by tightening and relaxing your leg muscles often. Take frequent walks.  Wear compression stockings as told by your health care provider to help prevent more blood clots from forming.  Do not use tobacco products, including cigarettes, chewing tobacco, and e-cigarettes. If you need help quitting, ask your health care provider.  Keep all follow-up appointments with your health care provider. This is important. How is this prevented? Take these actions to decrease your risk of developing another PE:  Exercise regularly. For at least 30 minutes every day, engage in:  Activity that involves moving your arms and legs.  Activity that encourages good blood flow through your body by increasing your heart rate.  Exercise your arms and legs every hour during long-distance travel (over 4 hours). Drink plenty of water and avoid drinking alcohol while traveling.  Avoid sitting or lying in bed for long periods of time without moving your legs.  Maintain a weight that is appropriate for your height. Ask your health care provider what weight is healthy for you.  If you are a woman who is over 56 years of age,  avoid unnecessary use of medicines that contain estrogen. These include birth control pills.  Do not smoke, especially if you take estrogen medicines. If you need help quitting, ask your health care provider.  If you are at very high risk for PE, wear compression stockings.  If you recently had a PE, have regularly scheduled ultrasound testing on your legs to check for new blood clots. If you are hospitalized, prevention measures may include:  Early walking after surgery, as soon as your health care provider says that it is safe.  Receiving anticoagulants to prevent blood clots. If you cannot take anticoagulants, other options may be available, such as wearing compression stockings or using different types of devices. Get help right away if:  You have new or increased pain, swelling, or redness in an arm or leg.  You have numbness or tingling in an arm or leg.  You have shortness of  breath while active or at rest.  You have chest pain.  You have a rapid or irregular heartbeat.  You feel light-headed or dizzy.  You cough up blood.  You notice blood in your vomit, bowel movement, or urine.  You have a fever. These symptoms may represent a serious problem that is an emergency. Do not wait to see if the symptoms will go away. Get medical help right away. Call your local emergency services (911 in the [place] Do not drive yourself to the hospital.  This information is not intended to replace advice given to you by your health care provider. Make sure you discuss any questions you have with your health care provider. Document Released: 02/28/2000 Document Revised: 08/08/2015 Document Reviewed: 06/27/2014 Elsevier Interactive Patient Education  2017 [employer].

## 2016-05-08 ENCOUNTER — Ambulatory Visit (INDEPENDENT_AMBULATORY_CARE_PROVIDER_SITE_OTHER)
Admission: RE | Admit: 2016-05-08 | Discharge: 2016-05-08 | Disposition: A | Discharge status: Home / Self Care | Payer: Medicare Other | Source: Ambulatory Visit | Attending: Acute Care | Admitting: Acute Care

## 2016-05-08 DIAGNOSIS — R0600 Dyspnea, unspecified: Secondary | ICD-10-CM

## 2016-05-08 DIAGNOSIS — R0602 Shortness of breath: Secondary | ICD-10-CM | POA: Diagnosis not present

## 2016-05-18 ENCOUNTER — Telehealth: Payer: Self-pay | Admitting: Internal Medicine

## 2016-05-18 MED ORDER — HYDROCODONE-ACETAMINOPHEN 10-325 MG PO TABS: 1.0000 | ORAL_TABLET | Freq: Four times a day (QID) | ORAL | 0 refills | Status: DC | PRN

## 2016-05-18 NOTE — Telephone Encounter (Signed)
Rx printed awaiting to be signed.  

## 2016-05-18 NOTE — Telephone Encounter (Signed)
Pt notified Rx ready for pickup. Rx printed and signed.  

## 2016-05-18 NOTE — Telephone Encounter (Signed)
Pt request refill  °HYDROcodone-acetaminophen (NORCO) 10-325 MG tablet °

## 2016-05-27 ENCOUNTER — Other Ambulatory Visit (INDEPENDENT_AMBULATORY_CARE_PROVIDER_SITE_OTHER): Payer: Medicare Other

## 2016-05-27 ENCOUNTER — Encounter: Payer: Self-pay | Admitting: Pulmonary Disease

## 2016-05-27 ENCOUNTER — Ambulatory Visit: Payer: Medicare Other | Admitting: Pulmonary Disease

## 2016-05-27 ENCOUNTER — Ambulatory Visit (HOSPITAL_COMMUNITY)
Admission: RE | Admit: 2016-05-27 | Discharge: 2016-05-27 | Disposition: A | Discharge status: Home / Self Care | Payer: Medicare Other | Source: Ambulatory Visit | Attending: Acute Care | Admitting: Acute Care

## 2016-05-27 ENCOUNTER — Other Ambulatory Visit: Payer: Self-pay | Admitting: Internal Medicine

## 2016-05-27 ENCOUNTER — Ambulatory Visit (INDEPENDENT_AMBULATORY_CARE_PROVIDER_SITE_OTHER): Payer: Medicare Other | Admitting: Pulmonary Disease

## 2016-05-27 VITALS — BP 152/78 | HR 79 | Ht 65.0 in | Wt 197.0 lb

## 2016-05-27 DIAGNOSIS — Z5181 Encounter for therapeutic drug level monitoring: Secondary | ICD-10-CM

## 2016-05-27 DIAGNOSIS — J849 Interstitial pulmonary disease, unspecified: Secondary | ICD-10-CM

## 2016-05-27 DIAGNOSIS — I2699 Other pulmonary embolism without acute cor pulmonale: Secondary | ICD-10-CM | POA: Diagnosis not present

## 2016-05-27 DIAGNOSIS — R0600 Dyspnea, unspecified: Secondary | ICD-10-CM

## 2016-05-27 DIAGNOSIS — J84112 Idiopathic pulmonary fibrosis: Secondary | ICD-10-CM

## 2016-05-27 LAB — PULMONARY FUNCTION TEST
DL/VA % pred: 87 %
DL/VA: 4.29 ml/min/mmHg/L
DLCO UNC: 14.26 ml/min/mmHg
DLCO unc % pred: 55 %
FEF 25-75 Post: 2.3 L/sec
FEF 25-75 Pre: 2.37 L/sec
FEF2575-%Change-Post: -3 %
FEF2575-%Pred-Post: 129 %
FEF2575-%Pred-Pre: 133 %
FEV1-%CHANGE-POST: -1 %
FEV1-%Pred-Post: 99 %
FEV1-%Pred-Pre: 100 %
FEV1-POST: 1.93 L
FEV1-PRE: 1.96 L
FEV1FVC-%Change-Post: 3 %
FEV1FVC-%PRED-PRE: 108 %
FEV6-%Change-Post: -4 %
FEV6-%PRED-POST: 92 %
FEV6-%PRED-PRE: 96 %
FEV6-POST: 2.22 L
FEV6-Pre: 2.32 L
FEV6FVC-%CHANGE-POST: 0 %
FEV6FVC-%PRED-POST: 102 %
FEV6FVC-%Pred-Pre: 102 %
FVC-%Change-Post: -4 %
FVC-%PRED-PRE: 94 %
FVC-%Pred-Post: 90 %
FVC-POST: 2.25 L
FVC-PRE: 2.35 L
POST FEV6/FVC RATIO: 99 %
PRE FEV6/FVC RATIO: 99 %
Post FEV1/FVC ratio: 86 %
Pre FEV1/FVC ratio: 83 %
RV % pred: 56 %
RV: 1.26 L
TLC % PRED: 67 %
TLC: 3.54 L

## 2016-05-27 LAB — HEPATIC FUNCTION PANEL
ALBUMIN: 4.4 g/dL (ref 3.5–5.2)
ALT: 19 U/L (ref 0–35)
AST: 18 U/L (ref 0–37)
Alkaline Phosphatase: 55 U/L (ref 39–117)
BILIRUBIN TOTAL: 0.6 mg/dL (ref 0.2–1.2)
Bilirubin, Direct: 0.1 mg/dL (ref 0.0–0.3)
Total Protein: 8.1 g/dL (ref 6.0–8.3)

## 2016-05-27 LAB — SEDIMENTATION RATE: Sed Rate: 58 mm/hr — ABNORMAL HIGH (ref 0–30)

## 2016-05-27 LAB — C-REACTIVE PROTEIN: CRP: 0.1 mg/dL — ABNORMAL LOW (ref 0.5–20.0)

## 2016-05-27 MED ORDER — LOSARTAN POTASSIUM 50 MG PO TABS: 50.0000 mg | ORAL_TABLET | Freq: Every day | ORAL | 0 refills | Status: DC

## 2016-05-27 MED ORDER — ALBUTEROL SULFATE (2.5 MG/3ML) 0.083% IN NEBU
2.5000 mg | INHALATION_SOLUTION | Freq: Once | RESPIRATORY_TRACT | Status: AC
  Administered 2016-05-27: 2.5 mg via RESPIRATORY_TRACT

## 2016-05-27 NOTE — Assessment & Plan Note (Signed)
A CT scan of her chest shows peripheral based honeycombing and interlobular septal thickening and traction bronchiectasis in a patchy distribution with normal lung nearby. This is consistent with usual interstitial pneumonitis. She does not have a clear history of an underlying connective tissue disease or chronic aspiration to explain this.  I fear she has idiopathic, or fibrosis but am pleased that her lung function testing is relatively well preserved and has not significantly worsened in the last year.  Plan: Check serology to look for evidence of a connective tissue disease which may explain the cause of her pulmonary fibrosis If this is negative we will formally diagnose her with idiopathic pulmonary fibrosis Start process for Esbriet, would prefer to avoid Ofev given her need for anticoagulant medicine. She was counseled on the side effect profile of Esbriet today including rash GI abnormalities and liver toxicity.  Follow-up 6 weeks percent if needed

## 2016-05-27 NOTE — Assessment & Plan Note (Signed)
She had an unprovoked pulmonary embolism diagnosed on CT scanning of the chest in February 2018. I took the history again today to try to identify any sort of provoking event and I cannot identify anything. Given the idiopathic nature of this condition we will need to consider lifelong therapy with an anticoagulant.  However we can consider dropping the dose of Xarelto to 10mg  daily in 10/2015 after 6 months of therapy.  She has not had bleeding from the Xarelto and was counseled to watch out for this and to stop taking the medicine right away because it can cause bleeding.  She wishes to continue taking the medicine.

## 2016-05-27 NOTE — Progress Notes (Signed)
Subjective:    Patient ID: Miranda [name], female    DOB: February 09, 1946, 71 y.o.   MRN: [id number]  Synopsis: Former patient of Dr. Gwenette Greet who was hospitalized in January 2017 with pneumonia and had findings on her CT chest which were worrisome for pulmonary fibrosis.In February 2018 she had a CT angiogram of her chest diagnosing a pulmonary embolism. This is felt to be idiopathic. A follow-up CT scan of the chest in March 2018 showed usual interstitial pneumonitis.    HPI Chief Complaint  Patient presents with  . Follow-up    review ct and pft.  pt c/o DOE, L sided chest discomfort intermittently.    Recent blood clot diagnosed in 04/2016.  Prior to this she said she been having about 2 weeks of shortness of breath. She denied any preceding surgery, acute illness, hospitalization or long travel. She noted severe cramps in her left leg a few days prior to the onset of worsening shortness of breath.   Her dyspnea has improved slowly since she had the blood clot. She still has dyspnea copmared to six months ago. She has no history of clots but she has several sisters who had miscarriages .   She has been on Xarelto. She has not been experiencing any bleeding or blood in her stools since taking this medicine.   Past Medical History:  Diagnosis Date  . Anxiety   . Asthma   . CHF (congestive heart failure) ([place])    pt. unsure- but thinks she was hosp. for CHF- 2002  . [facility] kidney disease    [facility]  . Colon polyps   . Complication of anesthesia    states requires a lot med. to put her to sleep   . DDD (degenerative disc disease) 09/17/2011  . Depression    "sometimes "  . Family history of anesthesia complication   . GERD (gastroesophageal reflux disease)   . Glaucoma    bilateral, pt. admits that she is noncompliant to eye gtts.   . Hemorrhoids   . Hyperlipidemia   . Hypertension    had stress, echo- 2006 /w [place], Cardiac Cath, per pt. 2002, echo repeated 2012-  [facility]   . Low back pain   . Shortness of breath   . Sleep apnea    uses c-pap- q night recently      Review of Systems  Constitutional: Positive for fatigue. Negative for chills and fever.  HENT: Negative for postnasal drip, rhinorrhea and sinus pain.   Respiratory: Positive for shortness of breath. Negative for cough and wheezing.   Cardiovascular: Negative for chest pain, palpitations and leg swelling.       Objective:   Physical Exam Vitals:   05/27/16 1540  BP: (!) 152/78  Pulse: 79  SpO2: 94%  Weight: 197 lb (89.4 kg)  Height: 5\' 5"  (1.651 m)   RA  Gen: well appearing HENT: OP clear, TM's clear, neck supple PULM: Crackles bases B, normal percussion CV: RRR, no mgr, trace edema GI: BS+, soft, nontender Derm: no cyanosis or rash Psyche: normal mood and affect   PFT Pulmonary function testing March 2017 showed a ratio of 84%, FVC 1.96 L (77% predicted), total lung capacity 3.85 L (74% predicted), DLCO 14.32 (55% predicted) March 2018 pulmonary function test ratio normal, FVC 2.25 L 90% predicted, total lung capacity 3.54 L 67% predicted, residual volume 1.26 L 56% predicted, DLCO 14.26 55% predicted  Imaging: February 2018: Images independently reviewed, compared to April  2017 there is no significant change in the peripheral and basilar predominant honeycomb change, traction bronchiectasis with significant patches of normal pulmonary parenchyma. Consistent with usual interstitial pneumonitis.     Assessment & Plan:  Acute pulmonary embolism ([place]) She had an unprovoked pulmonary embolism diagnosed on CT scanning of the chest in February 2018. I took the history again today to try to identify any sort of provoking event and I cannot identify anything. Given the idiopathic nature of this condition we will need to consider lifelong therapy with an anticoagulant.  However we can consider dropping the dose of Xarelto to 10mg  daily in 10/2015 after 6 months of therapy.  She  has not had bleeding from the Xarelto and was counseled to watch out for this and to stop taking the medicine [name] away because it can cause bleeding.  She wishes to continue taking the medicine.   UIP (usual interstitial pneumonitis) ([facility]) A CT scan of her chest shows peripheral based honeycombing and interlobular septal thickening and traction bronchiectasis in a patchy distribution with normal lung nearby. This is consistent with usual interstitial pneumonitis. She does not have a clear history of an underlying connective tissue disease or chronic aspiration to explain this.  I fear she has idiopathic, or fibrosis but am pleased that her lung function testing is relatively well preserved and has not significantly worsened in the last year.  Plan: Check serology to look for evidence of a connective tissue disease which may explain the cause of her pulmonary fibrosis If this is negative we will formally diagnose her with idiopathic pulmonary fibrosis Start process for Esbriet, would prefer to avoid Ofev given her need for anticoagulant medicine. She was counseled on the side effect profile of Esbriet today including rash GI abnormalities and liver toxicity.  Follow-up 6 weeks percent if needed    Current Outpatient Prescriptions:  .  albuterol (PROVENTIL HFA;VENTOLIN HFA) 108 (90 Base) MCG/ACT inhaler, Inhale 2 puffs into the lungs every 6 (six) hours as needed., Disp: 1 Inhaler, Rfl: 0 .  ALPRAZolam (XANAX) 0.25 MG tablet, Take 1 tablet (0.25 mg total) by mouth 3 (three) times daily as needed for anxiety or sleep., Disp: 30 tablet, Rfl: 5 .  amLODipine (NORVASC) 10 MG tablet, Take 1 tablet (10 mg total) by mouth daily., Disp: 90 tablet, Rfl: 1 .  benzonatate (TESSALON) 100 MG capsule, Take 1 capsule (100 mg total) by mouth 3 (three) times daily as needed., Disp: 20 capsule, Rfl: 0 .  cyclobenzaprine (FLEXERIL) 10 MG tablet, Take 1 tablet (10 mg total) by mouth 3 (three) times daily as needed.  (Patient taking differently: Take 10 mg by mouth 3 (three) times daily as needed for muscle spasms. ), Disp: 90 tablet, Rfl: 1 .  escitalopram (LEXAPRO) 5 MG tablet, Take 1 tablet (5 mg total) by mouth daily., Disp: 30 tablet, Rfl: 0 .  Fluticasone-Salmeterol (ADVAIR) 250-50 MCG/DOSE AEPB, Inhale 1 puff into the lungs every 12 (twelve) hours., Disp: 60 each, Rfl: 5 .  furosemide (LASIX) 40 MG tablet, Take 0.5 tablets (20 mg total) by mouth daily., Disp: 30 tablet, Rfl: 0 .  HYDROcodone-acetaminophen (NORCO) 10-325 MG tablet, Take 1 tablet by mouth every 6 (six) hours as needed., Disp: 90 tablet, Rfl: 0 .  lactulose (CHRONULAC) 10 GM/15ML solution, Take 30 mLs (20 g total) by mouth 3 (three) times daily as needed for mild constipation., Disp: 473 mL, Rfl: 1 .  linaclotide (LINZESS) 290 MCG CAPS capsule, Take 1 capsule (290 mcg  total) by mouth daily., Disp: 30 capsule, Rfl: 0 .  losartan (COZAAR) 50 MG tablet, Take 1 tablet (50 mg total) by mouth daily., Disp: 90 tablet, Rfl: 0 .  lovastatin (MEVACOR) 40 MG tablet, Take 1 tablet (40 mg total) by mouth at bedtime., Disp: 180 tablet, Rfl: 1 .  metoprolol tartrate (LOPRESSOR) 25 MG tablet, Take 0.5 tablets (12.5 mg total) by mouth 2 (two) times daily., Disp: 60 tablet, Rfl: 1 .  Rivaroxaban 15 & 20 MG TBPK, Take as directed on package: Start with one 15mg  tablet by mouth twice a day with food. On Day 22, switch to one 20mg  tablet once a day with food., Disp: 51 each, Rfl: 0 .  rivaroxaban (XARELTO) 20 MG TABS tablet, Take 1 tablet (20 mg total) by mouth daily with supper. (Patient not taking: Reported on 05/27/2016), Disp: 30 tablet, Rfl: 0

## 2016-05-27 NOTE — Patient Instructions (Signed)
For your blood clot: Keep taking Xarelto as prescribed We will plan on treating you with this dose through August 2018 Let us know if you have any bleeding while taking this medicine. Stop the medicine right away if you notice bleeding.  For the scarring in your lungs, this condition is called usual interstitial pneumonitis. We do not know why you have this condition, so we are going to check blood work today to try to look into it more. We are going to treat this condition with a medicine called Esbriet. It will take some time for rest to get approval from your insurance company for this medicine.  While taking Esbriet use sunscreen and and watch out for diarrhea.  Let us know if you have problems with it.  We will see you back in about 6 weeks or sooner if needed

## 2016-05-28 LAB — ANTI-SCLERODERMA ANTIBODY: Scleroderma (Scl-70) (ENA) Antibody, IgG: <1

## 2016-05-28 LAB — RHEUMATOID FACTOR: Rhuematoid fact SerPl-aCnc: <14 IU/mL (ref <14)

## 2016-05-28 LAB — ANTI-JO 1 ANTIBODY, IGG: Anti JO-1: <0.2 AI (ref 0.0–0.9)

## 2016-05-28 LAB — ANA: Anti Nuclear Antibody(ANA): NEGATIVE

## 2016-05-28 LAB — SJOGRENS SYNDROME-B EXTRACTABLE NUCLEAR ANTIBODY: SSB (LA) (ENA) ANTIBODY, IGG: NEGATIVE

## 2016-05-28 LAB — SJOGRENS SYNDROME-A EXTRACTABLE NUCLEAR ANTIBODY: SSA (RO) (ENA) ANTIBODY, IGG: NEGATIVE

## 2016-05-29 ENCOUNTER — Telehealth: Payer: Self-pay | Admitting: Internal Medicine

## 2016-05-29 LAB — ALDOLASE: Aldolase: 3 U/L (ref <=8.1)

## 2016-05-29 NOTE — Telephone Encounter (Signed)
Esbriet application form filled out completely.  Form placed in BQ folder for signature before being faxed for enrollment.

## 2016-05-29 NOTE — Telephone Encounter (Signed)
Patient is returning phone call.  °

## 2016-05-29 NOTE — Telephone Encounter (Signed)
Spoke with pt. Advised her that her PCP is responsible for filling this prescription and it was sent to [facility] on 05/27/16. Nothing further was needed.

## 2016-05-29 NOTE — Telephone Encounter (Signed)
Paperwork will be placed in BQ's folder not MW's. Will route message to [facility] for follow up.

## 2016-05-29 NOTE — Telephone Encounter (Signed)
lmtcb x1 for pt. 

## 2016-06-02 LAB — HYPERSENSITIVITY PNUEMONITIS PROFILE

## 2016-06-05 NOTE — Progress Notes (Signed)
Spoke with pt and notified of results per Dr.McQuaid. Pt verbalized understanding and denied any questions. 

## 2016-06-05 NOTE — Telephone Encounter (Signed)
Miranda [name] please advise of the application.  Has this been completed?  thanks

## 2016-06-10 ENCOUNTER — Ambulatory Visit (INDEPENDENT_AMBULATORY_CARE_PROVIDER_SITE_OTHER): Payer: Medicare Other | Admitting: Internal Medicine

## 2016-06-10 ENCOUNTER — Encounter: Payer: Self-pay | Admitting: Internal Medicine

## 2016-06-10 VITALS — BP 122/70 | HR 51 | Temp 98.2°F | Ht 65.0 in | Wt 200.6 lb

## 2016-06-10 DIAGNOSIS — Z0000 Encounter for general adult medical examination without abnormal findings: Secondary | ICD-10-CM

## 2016-06-10 MED ORDER — ESCITALOPRAM OXALATE 5 MG PO TABS: 5.0000 mg | ORAL_TABLET | Freq: Every day | ORAL | 0 refills | Status: DC

## 2016-06-10 MED ORDER — HYDROCODONE-ACETAMINOPHEN 10-325 MG PO TABS: 1.0000 | ORAL_TABLET | Freq: Four times a day (QID) | ORAL | 0 refills | Status: DC | PRN

## 2016-06-10 NOTE — Telephone Encounter (Signed)
Form is in my possession.  Will fax and update chart as it is completed.

## 2016-06-10 NOTE — Patient Instructions (Signed)
Limit your sodium (Salt) intake    It is important that you exercise regularly, at least 20 minutes 3 to 4 times per week.  If you develop chest pain or shortness of breath seek  medical attention.  You need to lose weight.  Consider a lower calorie diet and regular exercise.  Return in 6 months for follow-up   

## 2016-06-10 NOTE — Progress Notes (Signed)
Pre visit review using our clinic review tool, if applicable. No additional management support is needed unless otherwise documented below in the visit note. 

## 2016-06-10 NOTE — Progress Notes (Signed)
Subjective:    Patient ID: Miranda [name], female    DOB: February 01, 1946, 71 y.o.   MRN: [id number]  HPI  71 year old patient who is seen today for a preventive health examination and subsequent annual Medicare wellness visit. She has been followed by pulmonary medicine.  She has a history of a pulmonary embolism, unprovoked, approximately 2 months ago.  She also has a history of UIP and continues to have 1 half block DOE.  She has had a recent repeat autoimmune evaluation.  Otherwise, doing reasonably well  Her only other complaint is loose bowel movements.  She continues take medications for chronic constipation  Past Medical History:  Diagnosis Date  . Anxiety   . Asthma   . CHF (congestive heart failure) ([place])    pt. unsure- but thinks she was hosp. for CHF- 2002  . [facility] kidney disease    [facility]  . Colon polyps   . Complication of anesthesia    states requires a lot med. to put her to sleep   . DDD (degenerative disc disease) 09/17/2011  . Depression    "sometimes "  . Family history of anesthesia complication   . GERD (gastroesophageal reflux disease)   . Glaucoma    bilateral, pt. admits that she is noncompliant to eye gtts.   . Hemorrhoids   . Hyperlipidemia   . Hypertension    had stress, echo- 2006 /w [place], Cardiac Cath, per pt. 2002, echo repeated 2012- wnl   . Low back pain   . Shortness of breath   . Sleep apnea    uses c-pap- q night recently     Social History   Social History  . Marital status: Married    Spouse name: N/A  . Number of children: N/A  . Years of education: N/A   Occupational History  . retired Quarry manager Unemployed   Social History Main Topics  . Smoking status: Never Smoker  . Smokeless tobacco: Never Used  . Alcohol use No  . Drug use: No  . Sexual activity: No   Other Topics Concern  . Not on file   Social History Narrative  . No narrative on file    Past Surgical History:  Procedure Laterality Date  .  ABDOMINAL HYSTERECTOMY     ectopic, fibroids  . CARDIAC CATHETERIZATION    . COLONOSCOPY     remote  . EYE SURGERY     cataracts removed bilateral- ?IOL  . FISSURECTOMY  10/08/2011   Procedure: FISSURECTOMY;  Surgeon: Stark Klein, MD;  Location: [facility];  Service: General;  Laterality: N/A;  . FLEXIBLE SIGMOIDOSCOPY  02/25/2011   Procedure: FLEXIBLE SIGMOIDOSCOPY;  Surgeon: Inda Castle, MD;  Location: [facility];  Service: Endoscopy;  Laterality: N/A;  . FOOT SURGERY     bilat, heel spurs- screw in R foot   . HEMORRHOID SURGERY  10/08/2011   Procedure: HEMORRHOIDECTOMY;  Surgeon: Stark Klein, MD;  Location: [place];  Service: General;  Laterality: N/A;  External   . SPHINCTEROTOMY  10/08/2011   Procedure: Joan Mayans;  Surgeon: Stark Klein, MD;  Location: [facility];  Service: General;  Laterality: N/A;    Family History  Problem Relation Age of Onset  . Cancer Mother     breast  . Heart attack Mother   . Heart disease Mother   . Cancer Other     breast, colon, lung  . Heart disease Other   . Emphysema Sister   . Cancer  Sister     breast  . Asthma Sister   . Heart disease Sister   . Rheum arthritis Sister   . Cancer Brother     colon  . Anesthesia problems Neg Hx     Allergies  Allergen Reactions  . Fish Oil Anaphylaxis  . Other Hives, Shortness Of Breath and Swelling    Allergic to cashew nuts and peanut oil.  Marland Kitchen Penicillins Anaphylaxis, Hives and Swelling    Has patient had a PCN reaction causing immediate rash, facial/tongue/throat swelling, SOB or lightheadedness with hypotension: Face swelling and hives started first, then swelling of the throat  Has patient had a PCN reaction causing severe rash involving mucus membranes or skin necrosis: Yes  Has patient had a PCN reaction that required hospitalization: No  Has patient had a PCN reaction occurring within the last 10 years: Yes  If all of the above answers are "NO", then may proceed with Cephalospor  .  Pneumococcal Vaccines Nausea And Vomiting  . Aspirin Itching and Rash  . Bactrim [Sulfamethoxazole-Trimethoprim] Hives, Itching and Rash  . Ciprofloxacin Hives, Itching and Rash  . Ibuprofen Rash  . Influenza Vaccines Hives  . Ivp Dye [Iodinated Diagnostic Agents] Hives, Itching and Rash    Gives benadryl to counteract symptoms  . Latex Rash  . Macrobid [Nitrofurantoin Monohydrate Macrocrystals] Hives  . Shellfish Allergy Hives    Patient also allergic to seafood    Current Outpatient Prescriptions on File Prior to Visit  Medication Sig Dispense Refill  . albuterol (PROVENTIL HFA;VENTOLIN HFA) 108 (90 Base) MCG/ACT inhaler Inhale 2 puffs into the lungs every 6 (six) hours as needed. 1 Inhaler 0  . ALPRAZolam (XANAX) 0.25 MG tablet Take 1 tablet (0.25 mg total) by mouth 3 (three) times daily as needed for anxiety or sleep. 30 tablet 5  . amLODipine (NORVASC) 10 MG tablet Take 1 tablet (10 mg total) by mouth daily. 90 tablet 1  . benzonatate (TESSALON) 100 MG capsule Take 1 capsule (100 mg total) by mouth 3 (three) times daily as needed. 20 capsule 0  . cyclobenzaprine (FLEXERIL) 10 MG tablet Take 1 tablet (10 mg total) by mouth 3 (three) times daily as needed. (Patient taking differently: Take 10 mg by mouth 3 (three) times daily as needed for muscle spasms. ) 90 tablet 1  . Fluticasone-Salmeterol (ADVAIR) 250-50 MCG/DOSE AEPB Inhale 1 puff into the lungs every 12 (twelve) hours. 60 each 5  . furosemide (LASIX) 40 MG tablet Take 0.5 tablets (20 mg total) by mouth daily. 30 tablet 0  . losartan (COZAAR) 50 MG tablet Take 1 tablet (50 mg total) by mouth daily. 90 tablet 0  . lovastatin (MEVACOR) 40 MG tablet Take 1 tablet (40 mg total) by mouth at bedtime. 180 tablet 1  . metoprolol tartrate (LOPRESSOR) 25 MG tablet Take 0.5 tablets (12.5 mg total) by mouth 2 (two) times daily. 60 tablet 1  . rivaroxaban (XARELTO) 20 MG TABS tablet Take 1 tablet (20 mg total) by mouth daily with supper. 30  tablet 0  . Rivaroxaban 15 & 20 MG TBPK Take as directed on package: Start with one 15mg  tablet by mouth twice a day with food. On Day 22, switch to one 20mg  tablet once a day with food. 51 each 0   No current facility-administered medications on file prior to visit.     BP 122/70 (BP Location: Left Arm, Patient Position: Sitting, Cuff Size: Large)   Pulse (!) 51   Temp 98.2  F (36.8 C) (Oral)   Ht 5\' 5"  (1.651 m)   Wt 200 lb 9.6 oz (91 kg)   SpO2 96%   BMI 33.38 kg/m   Medicare wellness visit  1. Risk factors, based on past  M,S,F history.  Cardiovascular risk factors include hypertension.  She has a history of dyslipidemia  2.  Physical activities:Limited due to 1 half block dyspnea on exertion  3.  Depression/mood:no history of major depression  4.  Hearing:mild deficits only  5.  ADL's:independent  6.  Fall risk:low  7.  Home safety:no problems identified  8.  Height weight, and visual acuity;height and weight stable no change in visual acuity.  Follow-up annual eye examination recommended  9.  Counseling:anuli examination recommended.  Follow-up pulmonary medicine  10. Lab orders based on risk factors:no laboratory studies needed at this time  11. Referral :pulmonary follow-up.  Needs annual mammogram  12. Care plan:continue efforts at risk factor modification  13. Cognitive assessment: alert and oriented with normal affect no cognitive dysfunction  14. Screening: Patient provided with a written and personalized 5-10 year screening schedule in the AVS.    15. Provider List Update: pulmonary primary care GI    Review of Systems  Constitutional: Negative for appetite change, fatigue, fever and unexpected weight change.  HENT: Negative for congestion, dental problem, ear pain, hearing loss, mouth sores, nosebleeds, sinus pressure, sore throat, tinnitus, trouble swallowing and voice change.   Eyes: Negative for photophobia, pain, redness and visual disturbance.    Respiratory: Positive for shortness of breath. Negative for cough and chest tightness.   Cardiovascular: Negative for chest pain, palpitations and leg swelling.  Gastrointestinal: Positive for diarrhea. Negative for abdominal distention, abdominal pain, blood in stool, constipation, nausea, rectal pain and vomiting.  Genitourinary: Negative for difficulty urinating, dysuria, flank pain, frequency, genital sores, hematuria, menstrual problem, pelvic pain, urgency, vaginal bleeding, vaginal discharge and vaginal pain.  Musculoskeletal: Negative for arthralgias, back pain and neck stiffness.  Skin: Negative for rash.  Neurological: Negative for dizziness, syncope, speech difficulty, weakness, light-headedness, numbness and headaches.  Hematological: Negative for adenopathy. Does not bruise/bleed easily.  Psychiatric/Behavioral: Negative for agitation, behavioral problems, dysphoric mood, self-injury and suicidal ideas. The patient is not nervous/anxious.        Objective:   Physical Exam  Constitutional: She is oriented to person, place, and time. She appears well-developed and well-nourished.  HENT:  Head: Normocephalic and atraumatic.  [name] Ear: External ear normal.  Left Ear: External ear normal.  Mouth/Throat: Oropharynx is clear and moist.  Eyes: Conjunctivae and EOM are normal.  Neck: Normal range of motion. Neck supple. No JVD present. No thyromegaly present.  Cardiovascular: Normal rate, regular rhythm, normal heart sounds and intact distal pulses.   No murmur heard. [name] femoral bruit Dorsalis pedis pulses full.  Posterior tibial pulses not easily palpable  Pulmonary/Chest: Effort normal and breath sounds normal. She has no wheezes. She has no rales.  No pulmonary rales noted today  Abdominal: Soft. Bowel sounds are normal. She exhibits no distension and no mass. There is no tenderness. There is no rebound and no guarding.  Genitourinary: Vagina normal.  Musculoskeletal:  Normal range of motion. She exhibits no edema or tenderness.  Neurological: She is alert and oriented to person, place, and time. She has normal reflexes. No cranial nerve deficit. She exhibits normal muscle tone. Coordination normal.  Skin: Skin is warm and dry. No rash noted.  Psychiatric: She has a normal mood and affect.  Her behavior is normal.          Assessment & Plan:   Preventive health examination Subsequent annual Medicare wellness visit UIP.  Pulmonary follow-up History of pulmonary embolism.  We'll continue full anticoagulation for a minimum of 6 months.  At that time, we'll consider decrease dose of anticoagulation Essential hypertension, well-controlled Loose stool.  Linzess and Chronulac discontinued.  Continue high-fiber diet  Follow-up 6 months  Kaliel Bolds Pilar Plate

## 2016-06-17 ENCOUNTER — Other Ambulatory Visit: Payer: Self-pay | Admitting: Internal Medicine

## 2016-06-17 DIAGNOSIS — Z1231 Encounter for screening mammogram for malignant neoplasm of breast: Secondary | ICD-10-CM

## 2016-06-26 NOTE — Telephone Encounter (Signed)
Caryl Pina any update on the [facility] for this pt?  thanks

## 2016-06-30 ENCOUNTER — Telehealth: Payer: Self-pay | Admitting: Internal Medicine

## 2016-06-30 NOTE — Telephone Encounter (Signed)
° ° ° °  Pt request refill of the following:  rivaroxaban (XARELTO) 20 MG TABS tablet   Phamacy:  [address]

## 2016-06-30 NOTE — Telephone Encounter (Signed)
Okay 30 day supply 

## 2016-06-30 NOTE — Telephone Encounter (Signed)
Pt needs new rx hydrocodone °

## 2016-07-01 MED ORDER — RIVAROXABAN 20 MG PO TABS: 20.0000 mg | ORAL_TABLET | Freq: Every day | ORAL | 0 refills | Status: DC

## 2016-07-01 MED ORDER — HYDROCODONE-ACETAMINOPHEN 10-325 MG PO TABS: 1.0000 | ORAL_TABLET | Freq: Four times a day (QID) | ORAL | 0 refills | Status: DC | PRN

## 2016-07-01 NOTE — Telephone Encounter (Signed)
Rx printed awaiting to be signed.  

## 2016-07-01 NOTE — Telephone Encounter (Signed)
Pt notified Rx ready for pickup. Rx printed and signed.  

## 2016-07-01 NOTE — Telephone Encounter (Signed)
Medication was refilled.

## 2016-07-01 NOTE — Telephone Encounter (Signed)
See MD response

## 2016-07-03 NOTE — Telephone Encounter (Signed)
[name], any updates?

## 2016-07-06 ENCOUNTER — Ambulatory Visit
Admission: RE | Admit: 2016-07-06 | Discharge: 2016-07-06 | Disposition: A | Discharge status: Home / Self Care | Payer: Medicare Other | Source: Ambulatory Visit | Attending: Internal Medicine | Admitting: Internal Medicine

## 2016-07-06 DIAGNOSIS — Z1231 Encounter for screening mammogram for malignant neoplasm of breast: Secondary | ICD-10-CM | POA: Diagnosis not present

## 2016-07-07 NOTE — Telephone Encounter (Signed)
Called and spoke to rep with Miranda [name] and was advised they did not receive the [facility] SMN. The SMN and PAN form have been re-faxed to [place] and the forms have been placed back in [name]'s folder.

## 2016-07-08 ENCOUNTER — Telehealth: Payer: Self-pay | Admitting: Internal Medicine

## 2016-07-09 NOTE — Telephone Encounter (Signed)
Attempted to call [facility] back but they are not open at this time.  Will try back later.

## 2016-07-10 ENCOUNTER — Ambulatory Visit (INDEPENDENT_AMBULATORY_CARE_PROVIDER_SITE_OTHER): Payer: Medicare Other | Admitting: Adult Health

## 2016-07-10 ENCOUNTER — Encounter: Payer: Self-pay | Admitting: Adult Health

## 2016-07-10 DIAGNOSIS — I2699 Other pulmonary embolism without acute cor pulmonale: Secondary | ICD-10-CM | POA: Diagnosis not present

## 2016-07-10 DIAGNOSIS — J849 Interstitial pulmonary disease, unspecified: Secondary | ICD-10-CM

## 2016-07-10 MED ORDER — RIVAROXABAN 20 MG PO TABS: 20.0000 mg | ORAL_TABLET | Freq: Every day | ORAL | 5 refills | Status: DC

## 2016-07-10 MED ORDER — RIVAROXABAN 20 MG PO TABS
20.0000 mg | ORAL_TABLET | Freq: Every day | ORAL | 5 refills | Status: DC
Start: 2016-07-10 — End: 2017-03-23

## 2016-07-10 NOTE — Addendum Note (Signed)
Addended by: Parke Poisson E on: 07/10/2016 10:56 AM   Modules accepted: Orders

## 2016-07-10 NOTE — Telephone Encounter (Signed)
PT ID # is [id number]  Called [facility] [employer] and they are going to fax over the medical necessity form to be signed and dated.  Will forward to [place] to look out for this form coming through.

## 2016-07-10 NOTE — Assessment & Plan Note (Addendum)
Presumed UIP on CT chest awaiting Esbriet approval  Autoimmune/CTD w/up neg.   Plan  Patient Instructions  Continue on current regimen .  Awaiting approval for Esbriet , please call us if you get approval and when you start Esbriet.  Follow up Dr. [name] in 2 months and As needed

## 2016-07-10 NOTE — Patient Instructions (Signed)
Continue on current regimen .  Awaiting approval for Esbriet , please call us if you get approval and when you start Esbriet.  Follow up Dr. [name] in 2 months and As needed

## 2016-07-10 NOTE — Progress Notes (Signed)
@Patient  ID: Miranda [name], female    DOB: 19-Nov-1945, 71 y.o.   MRN: [id number]  Chief Complaint  Patient presents with  . Follow-up    ILD     Referring provider: Marletta Lor, MD  HPI: 71 year old female followed for pulmonary fibrosis and pulmonary embolism  TEST /Event  PFT Pulmonary function testing March 2017 showed a ratio of 84%, FVC 1.96 L (77% predicted), total lung capacity 3.85 L (74% predicted), DLCO 14.32 (55% predicted) March 2018 pulmonary function test ratio normal, FVC 2.25 L 90% predicted, total lung capacity 3.54 L 67% predicted, residual volume 1.26 L 56% predicted, DLCO 14.26 55% predicted CT chest + Unprovoked PE , started on Xarelto  Imaging: February 2018:  , compared to April 2017 there is no significant change in the peripheral and basilar predominant honeycomb change, traction bronchiectasis with significant patches of normal pulmonary parenchyma. Consistent with usual interstitial pneumonitis.  07/10/2016 Follow up : ILD /PE  Patient returns for a one-month follow-up. Patient was seen last visit after being diagnosed with an unprovoked pulmonary embolism. She remains on Xarelto. She says that her breathing is improving. She denies any known bleeding.. She continues to get doe with walking long distance and if you get overheated.   Patient has been found to have presumed idiopathic pulmonary fibrosis. CT chest February 2018 showed no significant change in peripheral and basilar honeycombing and traction bronchiectasis consistent with UIP. Autoimmune and connective tissue workup was unrevealing Patient was recommended to start on Esbriet . Approval process is pending.   Allergies  Allergen Reactions  . Fish Oil Anaphylaxis  . Other Hives, Shortness Of Breath and Swelling    Allergic to cashew nuts and peanut oil.  Marland Kitchen Penicillins Anaphylaxis, Hives and Swelling    Has patient had a PCN reaction causing immediate rash, facial/tongue/throat  swelling, SOB or lightheadedness with hypotension: Face swelling and hives started first, then swelling of the throat  Has patient had a PCN reaction causing severe rash involving mucus membranes or skin necrosis: Yes  Has patient had a PCN reaction that required hospitalization: No  Has patient had a PCN reaction occurring within the last 10 years: Yes  If all of the above answers are "NO", then may proceed with Cephalospor  . Pneumococcal Vaccines Nausea And Vomiting  . Aspirin Itching and Rash  . Bactrim [Sulfamethoxazole-Trimethoprim] Hives, Itching and Rash  . Ciprofloxacin Hives, Itching and Rash  . Ibuprofen Rash  . Influenza Vaccines Hives  . Ivp Dye [Iodinated Diagnostic Agents] Hives, Itching and Rash    Gives benadryl to counteract symptoms  . Latex Rash  . Macrobid [Nitrofurantoin Monohydrate Macrocrystals] Hives  . Shellfish Allergy Hives    Patient also allergic to seafood    Immunization History  Administered Date(s) Administered  . PPD Test 11/19/2010    Past Medical History:  Diagnosis Date  . Anxiety   . Asthma   . CHF (congestive heart failure) ([place])    pt. unsure- but thinks she was hosp. for CHF- 2002  . [facility] kidney [facility]    [facility]  . Colon polyps   . Complication of anesthesia    states requires a lot med. to put her to sleep   . DDD (degenerative disc disease) 09/17/2011  . Depression    "sometimes "  . Family history of anesthesia complication   . GERD (gastroesophageal reflux disease)   . Glaucoma    bilateral, pt. admits that she is noncompliant to  eye gtts.   . Hemorrhoids   . Hyperlipidemia   . Hypertension    had stress, echo- 2006 /w [place], Cardiac Cath, per pt. 2002, echo repeated 2012- wnl   . Low back pain   . Shortness of breath   . Sleep apnea    uses c-pap- q night recently    Tobacco History: History  Smoking Status  . Never Smoker  Smokeless Tobacco  . Never Used   Counseling given: Not  Answered   Outpatient Encounter Prescriptions as of 07/10/2016  Medication Sig  . albuterol (PROVENTIL HFA;VENTOLIN HFA) 108 (90 Base) MCG/ACT inhaler Inhale 2 puffs into the lungs every 6 (six) hours as needed.  . ALPRAZolam (XANAX) 0.25 MG tablet Take 1 tablet (0.25 mg total) by mouth 3 (three) times daily as needed for anxiety or sleep.  Marland Kitchen amLODipine (NORVASC) 10 MG tablet Take 1 tablet (10 mg total) by mouth daily.  . benzonatate (TESSALON) 100 MG capsule Take 1 capsule (100 mg total) by mouth 3 (three) times daily as needed.  . cyclobenzaprine (FLEXERIL) 10 MG tablet Take 1 tablet (10 mg total) by mouth 3 (three) times daily as needed. (Patient taking differently: Take 10 mg by mouth 3 (three) times daily as needed for muscle spasms. )  . escitalopram (LEXAPRO) 5 MG tablet Take 1 tablet (5 mg total) by mouth daily.  . Fluticasone-Salmeterol (ADVAIR) 250-50 MCG/DOSE AEPB Inhale 1 puff into the lungs every 12 (twelve) hours.  . furosemide (LASIX) 40 MG tablet Take 0.5 tablets (20 mg total) by mouth daily.  Marland Kitchen HYDROcodone-acetaminophen (NORCO) 10-325 MG tablet Take 1 tablet by mouth every 6 (six) hours as needed.  Marland Kitchen losartan (COZAAR) 50 MG tablet Take 1 tablet (50 mg total) by mouth daily.  Marland Kitchen lovastatin (MEVACOR) 40 MG tablet Take 1 tablet (40 mg total) by mouth at bedtime.  . metoprolol tartrate (LOPRESSOR) 25 MG tablet Take 0.5 tablets (12.5 mg total) by mouth 2 (two) times daily.  . rivaroxaban (XARELTO) 20 MG TABS tablet Take 1 tablet (20 mg total) by mouth daily with supper.  . Rivaroxaban 15 & 20 MG TBPK Take as directed on package: Start with one 15mg  tablet by mouth twice a day with food. On Day 22, switch to one 20mg  tablet once a day with food.  . [DISCONTINUED] rivaroxaban (XARELTO) 20 MG TABS tablet Take 1 tablet (20 mg total) by mouth daily with supper.   No facility-administered encounter medications on file as of 07/10/2016.      Review of Systems  Constitutional:   No   weight loss, night sweats,  Fevers, chills,  +fatigue, or  lassitude.  HEENT:   No headaches,  Difficulty swallowing,  Tooth/dental problems, or  Sore throat,                No sneezing, itching, ear ache, nasal congestion, post nasal drip,   CV:  No chest pain,  Orthopnea, PND, swelling in lower extremities, anasarca, dizziness, palpitations, syncope.   GI  No heartburn, indigestion, abdominal pain, nausea, vomiting, diarrhea, change in bowel habits, loss of appetite, bloody stools.   Resp:    No chest wall deformity  Skin: no rash or lesions.  GU: no dysuria, change in color of urine, no urgency or frequency.  No flank pain, no hematuria   MS:  No joint pain or swelling.  No decreased range of motion.  No back pain.    Physical Exam  BP 108/60 (BP Location: Left Arm,  Cuff Size: Normal)   Pulse (!) 54   Ht 5\' 5"  (1.651 m)   Wt 200 lb (90.7 kg)   SpO2 99%   BMI 33.28 kg/m   GEN: A/Ox3; pleasant , NAD, eldelry    HEENT:  [place]/AT,  EACs-clear, TMs-wnl, NOSE-clear, THROAT-clear, no lesions, no postnasal drip or exudate noted.   NECK:  Supple w/ fair ROM; no JVD; normal carotid impulses w/o bruits; no thyromegaly or nodules palpated; no lymphadenopathy.    RESP  Few trace bb crackles ,  no accessory muscle use, no dullness to percussion  CARD:  RRR, no m/r/g, no peripheral edema, pulses intact, no cyanosis or clubbing.  GI:   Soft & nt; nml bowel sounds; no organomegaly or masses detected.   Musco: Warm bil, no deformities or joint swelling noted.   Neuro: alert, no focal deficits noted.    Skin: Warm, no lesions or rashes    Lab Results:  CBC  No results found for: BNP  ProBNP    Component Value Date/Time   PROBNP 61.6 08/30/2010 0710    Imaging: Mm Digital Screening Bilateral  Result Date: 07/06/2016 CLINICAL DATA:  Screening. EXAM: DIGITAL SCREENING BILATERAL MAMMOGRAM WITH CAD COMPARISON:  Previous exam(s). ACR Breast Density Category b: There are  scattered areas of fibroglandular density. FINDINGS: There are no findings suspicious for malignancy. Images were processed with CAD. IMPRESSION: No mammographic evidence of malignancy. A result letter of this screening mammogram will be mailed directly to the patient. RECOMMENDATION: Screening mammogram in one year. (Code:SM-B-01Y) BI-RADS CATEGORY  1: Negative. Electronically Signed   By: Ammie Ferrier M.D.   On: 07/06/2016 16:02     Assessment & Plan:   ILD (interstitial lung disease) ([place]) Presumed UIP on CT chest awaiting Esbriet approval  Autoimmune/CTD w/up neg.   Plan  Patient Instructions  Continue on current regimen .  Awaiting approval for Esbriet , please call us if you get approval and when you start Esbriet.  Follow up Dr. [name] in 2 months and As needed       Acute pulmonary embolism ([place]) Unprovoked PE dx 04/2016 . Doing well on Xarelto .  Cont on Xarelto on 20mg . daily  Pt education given .      Rexene Edison, NP 07/10/2016

## 2016-07-10 NOTE — Assessment & Plan Note (Signed)
Unprovoked PE dx 04/2016 . Doing well on Xarelto .  Cont on Xarelto on 20mg . daily  Pt education given .

## 2016-07-14 ENCOUNTER — Telehealth: Payer: Self-pay | Admitting: Internal Medicine

## 2016-07-14 NOTE — Telephone Encounter (Signed)
Please see message below, please advise 

## 2016-07-14 NOTE — Telephone Encounter (Signed)
Okay for refill?  

## 2016-07-14 NOTE — Telephone Encounter (Signed)
Pt would like to know why she only received 30 hydrocodone and not 90. Please call pt

## 2016-07-14 NOTE — Telephone Encounter (Signed)
Okay for 30-day supply 

## 2016-07-15 NOTE — Telephone Encounter (Signed)
Multiple phone notes opened on this same topic.  Will close this encounter- see 4/25 phone note.

## 2016-07-15 NOTE — Telephone Encounter (Signed)
Due for next refill on 07/31/16

## 2016-07-15 NOTE — Telephone Encounter (Signed)
This is a BQ pt  Will forward to [facility] per her request Thanks

## 2016-07-15 NOTE — Telephone Encounter (Signed)
Magda Paganini any update on the medical necessity forms from [place]?  thanks

## 2016-07-15 NOTE — Telephone Encounter (Signed)
Spoke with Minette Brine with [employer]  She is calling to check on authorization form  She is going to refax this  Will hold in triage until received

## 2016-07-16 NOTE — Telephone Encounter (Signed)
Called and spoke to [place] with [employer] and was advised that they are waiting on pt's PAN form that she mailed back in on 5.2.2018 and waiting to receive the signed SMN form, I re-faxed this today 07/16/16. Noreene Larsson states that is all that is needed at this time and once both forms are recieved they will process the start.  Will forward to [place] to await a PA.

## 2016-07-20 NOTE — Progress Notes (Signed)
Reviewed, agree 

## 2016-07-23 NOTE — Telephone Encounter (Signed)
Miranda [name] please advise if you have received forms back on this patient -- she is calling to come in and sign them.  These were mailed out to our office on 07/15/16

## 2016-07-23 NOTE — Telephone Encounter (Signed)
Spoke with pt who states [facility] called her and stated that they were faxing the form that needed to be corrected to our office, she states they told her they sent it yesterday. Checked [name]'s cubby I did not see no form to be filled out.  Called [facility] and they stated the information that is missing is the date that the pt filled out form. They will re-fax the original forms with the information to be filled in. Gave them triage fax number. Pt is aware of what was told to [place], will await the form to come in to make sure nothing further is needed

## 2016-07-23 NOTE — Telephone Encounter (Signed)
Patient called stating she was to come in and sign forms once they were received, but have not received a call yet about coming in...[phone].Miranda [name]

## 2016-07-23 NOTE — Telephone Encounter (Signed)
I don't have any forms on this patient aside from the initial application we sent in.  Thanks!

## 2016-07-23 NOTE — Telephone Encounter (Signed)
[id number] x1 

## 2016-07-23 NOTE — Telephone Encounter (Signed)
Patient returned phone call..ert ° °

## 2016-07-24 ENCOUNTER — Telehealth: Payer: Self-pay | Admitting: Internal Medicine

## 2016-07-24 NOTE — Telephone Encounter (Signed)
° °  Pt request refill of the following:  HYDROcodone-acetaminophen (NORCO) 10-325 MG tablet  Pt said she has been getting 90 and only received 30 last month   Phamacy:

## 2016-07-27 NOTE — Telephone Encounter (Signed)
° ° ° °  Pt call today to follow up on her request  for her Hydrocodone    Pt requesting that she get her 90 day supply

## 2016-07-27 NOTE — Telephone Encounter (Signed)
Pt reports that last rx was written for #30, not for 30 days. She has run out of medication. She is requesting refill.  Dr. Raliegh Ip - Please advise. Thanks!

## 2016-07-27 NOTE — Telephone Encounter (Signed)
Per chart notes Dr. Raliegh Ip authorized 30 day supply at last refill request 07/01/16. Pt not due for refill until 07/31/16.  LMTCB

## 2016-07-27 NOTE — Telephone Encounter (Signed)
Okay, #90 

## 2016-07-28 MED ORDER — HYDROCODONE-ACETAMINOPHEN 10-325 MG PO TABS: 1.0000 | ORAL_TABLET | Freq: Four times a day (QID) | ORAL | 0 refills | Status: DC | PRN

## 2016-07-28 NOTE — Telephone Encounter (Signed)
AC states currently working on this  Will forward to her to f/u on

## 2016-07-28 NOTE — Telephone Encounter (Signed)
Spoke with pt and advised of error. New rx printed and pt aware to pick up at front desk.

## 2016-07-30 ENCOUNTER — Telehealth: Payer: Self-pay | Admitting: Internal Medicine

## 2016-07-30 NOTE — Telephone Encounter (Signed)
error 

## 2016-08-03 NOTE — Telephone Encounter (Signed)
Patient returned call, [phone]

## 2016-08-03 NOTE — Telephone Encounter (Signed)
Miranda [name] please let us know if you need any help.  ATC pt x1 just to let her know we have the form LMOM tcb x1

## 2016-08-04 NOTE — Telephone Encounter (Signed)
Date was placed on form, and faxed today. Pt is pretty concerned that she has not been received her medication. Informed her we do apologize but we are working on this. Will forward to [place] to follow up on to make sure they received the fax and nothing further is needed

## 2016-08-04 NOTE — Telephone Encounter (Signed)
Patient returned phone call about paperwork, patient contact # [phone].Marland Kitchenert

## 2016-08-05 NOTE — Telephone Encounter (Signed)
Maria from [facility] calling about Esbriet, does require PA and she has faxed that over today.  CB is [phone] ext [phone].

## 2016-08-05 NOTE — Telephone Encounter (Signed)
Spoke with Verdis Frederickson at [employer] This has been initiated by [employer] on [place] -- Key# [id number] For telephone PA call [facility] [phone] Patient ID# [id number]  Called [facility] Spoke with Princess, initiated PA for Esbriet 267mg  (max dose #270, 3 tabs TID, 30 day supply) PA is pending clinical review - Confirmation # [id number] Fax will be sent in the next 24-72hrs with determination Will send to [facility] to follow up.

## 2016-08-11 ENCOUNTER — Telehealth: Payer: Self-pay | Admitting: Pulmonary Disease

## 2016-08-11 NOTE — Telephone Encounter (Signed)
Spoke with Raford Pitcher about appeals process. She will fax over the appeal paperwork today. Will keep an eye for them. Will forward this to [place] as a FYI.

## 2016-08-11 NOTE — Telephone Encounter (Signed)
Need to appeal, please call and request forms that are needed if any then let me know how to proceed

## 2016-08-11 NOTE — Telephone Encounter (Signed)
Caryl Pina please advise on update when available.   Thanks

## 2016-08-11 NOTE — Telephone Encounter (Signed)
Spoke with Verdis Frederickson with [facility], who states PA for Jacklynn Bue has been denied. Verdis Frederickson states [facility] now offers an appeal service. [facility] has a team member who does appeal from start to finish including writing appeal letter. If BQ wished to process with appeal, Raford Pitcher can be reached at [phone].  BQ please advise. Thanks.

## 2016-08-14 ENCOUNTER — Telehealth: Payer: Self-pay | Admitting: *Deleted

## 2016-08-14 NOTE — Telephone Encounter (Signed)
Called [facility] [phone] for status of PA for [facility] with Angela Nevin - Patient ID# [id number] Denied 08-18-2016 due to dx codes not being entered correctly on their behalf when initiated.  I advised that I gave specific diagnosis codes and the codes that Angela Nevin read back were different than those given when initiated. Instead of having Interstitial Lung Disease they had Interstitial Pulmonary Disease, Unspecified and did not list the UIP diagnosis code given as a secondary dx.  Angela Nevin apologized for the inconvenience but states that an Appeal has to be done now to obtain approval ([phone] -----   [place] [phone], opt 1, opt 2 Call transferred to [facility] Appeals Dept to get appeal information needed. Spoke with Kathlee Nations, appeal has to be done, letter needs to be faxed to (fax) [phone] with supporting documentation/testing, reason for Medication and diagnosis codes.  Reference # [id number]  Please advise Dr [name] on letter. Thanks.  Will send to [facility] to follow up.

## 2016-08-14 NOTE — Telephone Encounter (Signed)
error 

## 2016-08-14 NOTE — Telephone Encounter (Signed)
OK, please send a letter that states that she has a diagnosis of IPF. Include my office notes for PFT, high resolution CT scanning of the chest.  We will need to mention that she has been tested for connective tissue diseases and all blood work is negative.

## 2016-08-17 NOTE — Telephone Encounter (Signed)
Letter typed and HRCT, PFT, labs, OV notes attached.  Faxed to (fax) [phone] Will send to Caryl Pina to f/u

## 2016-08-18 ENCOUNTER — Telehealth: Payer: Self-pay | Admitting: Pulmonary Disease

## 2016-08-18 NOTE — Telephone Encounter (Signed)
See 6/5 phone note- esbriet approved.  Nothing further needed.

## 2016-08-18 NOTE — Telephone Encounter (Signed)
Esbriet approved.  [facility] aware of approval- will set up delivery with patient.  Nothing further needed .

## 2016-08-19 ENCOUNTER — Telehealth: Payer: Self-pay | Admitting: Internal Medicine

## 2016-08-19 NOTE — Telephone Encounter (Signed)
Pt need new Rx for Hydrocodone-acetaminophen 10-325  Pt is aware of 3 business days for refills and someone will call when ready for pick up.  Pt is aware that Dr. Raliegh Ip is out of the office.

## 2016-08-20 ENCOUNTER — Telehealth: Payer: Self-pay

## 2016-08-20 NOTE — Telephone Encounter (Signed)
Medication last refilled on 07/28/16, too early to refill. Rx must last 30 days. Will print Rx after 08/28/16.

## 2016-08-20 NOTE — Telephone Encounter (Signed)
Pt is aware that she cannot fill hydrocodone until 6/15. She would like to know about taking extra strength Tylenol until she can get pain medication filled. Advised pt to follow directions on bottle and to not take more than the recommended dose as she can risk liver damage over time. Pt agreed to follow bottle directions. Nothing further needed at this time.

## 2016-08-21 ENCOUNTER — Telehealth: Payer: Self-pay | Admitting: Pulmonary Disease

## 2016-08-21 NOTE — Telephone Encounter (Signed)
Returned call, CB is [phone]

## 2016-08-21 NOTE — Telephone Encounter (Signed)
lmtcb for pt.  

## 2016-08-21 NOTE — Telephone Encounter (Signed)
Spoke with pt, wants to make [place] aware that she has received her shipment of [place] and has started rx.  Will close encounter.

## 2016-08-24 NOTE — Telephone Encounter (Signed)
Patient wants to come back on Friday to pick up her prescription.

## 2016-08-27 ENCOUNTER — Telehealth: Payer: Self-pay | Admitting: Internal Medicine

## 2016-08-27 NOTE — Telephone Encounter (Signed)
Pt needs new rx hydrocodone °

## 2016-08-28 ENCOUNTER — Other Ambulatory Visit: Payer: Self-pay

## 2016-08-28 MED ORDER — HYDROCODONE-ACETAMINOPHEN 10-325 MG PO TABS: 1.0000 | ORAL_TABLET | Freq: Four times a day (QID) | ORAL | 0 refills | Status: DC | PRN

## 2016-08-28 NOTE — Telephone Encounter (Signed)
Rx was signed and picked up by patient at 12.55pm.

## 2016-08-28 NOTE — Telephone Encounter (Signed)
Patient Rx for Hydrocodone has been printed awaiting for doctor K to sign

## 2016-08-31 NOTE — Telephone Encounter (Signed)
Rx was printed on 08/28/16.

## 2016-09-01 ENCOUNTER — Other Ambulatory Visit: Payer: Self-pay | Admitting: Internal Medicine

## 2016-09-02 ENCOUNTER — Other Ambulatory Visit (INDEPENDENT_AMBULATORY_CARE_PROVIDER_SITE_OTHER): Payer: Medicare Other

## 2016-09-02 ENCOUNTER — Ambulatory Visit (INDEPENDENT_AMBULATORY_CARE_PROVIDER_SITE_OTHER): Payer: Medicare Other | Admitting: Pulmonary Disease

## 2016-09-02 ENCOUNTER — Encounter: Payer: Self-pay | Admitting: Pulmonary Disease

## 2016-09-02 VITALS — BP 124/68 | HR 68 | Ht 65.0 in | Wt 204.8 lb

## 2016-09-02 DIAGNOSIS — J84112 Idiopathic pulmonary fibrosis: Secondary | ICD-10-CM

## 2016-09-02 DIAGNOSIS — Z5181 Encounter for therapeutic drug level monitoring: Secondary | ICD-10-CM | POA: Diagnosis not present

## 2016-09-02 DIAGNOSIS — G4733 Obstructive sleep apnea (adult) (pediatric): Secondary | ICD-10-CM | POA: Diagnosis not present

## 2016-09-02 LAB — HEPATIC FUNCTION PANEL
ALT: 16 U/L (ref 0–35)
AST: 17 U/L (ref 0–37)
Albumin: 4.1 g/dL (ref 3.5–5.2)
Alkaline Phosphatase: 60 U/L (ref 39–117)
BILIRUBIN DIRECT: 0.1 mg/dL (ref 0.0–0.3)
Total Bilirubin: 0.4 mg/dL (ref 0.2–1.2)
Total Protein: 7.5 g/dL (ref 6.0–8.3)

## 2016-09-02 NOTE — Progress Notes (Signed)
Subjective:    Patient ID: Miranda [name], female    DOB: 1945-03-28, 72 y.o.   MRN: [id number]  Synopsis: Former patient of Dr. Gwenette Greet who was hospitalized in January 2017 with pneumonia and had findings on her CT chest which were worrisome for pulmonary fibrosis.In February 2018 she had a CT angiogram of her chest diagnosing a pulmonary embolism. This is felt to be idiopathic. A follow-up CT scan of the chest in March 2018 showed usual interstitial pneumonitis.  Serologic blood work showed no evidence of a connective tissue disease.    HPI Chief Complaint  Patient presents with  . Follow-up    new start Esbriet f/u, been on it for 3 days, concerned about side effects, sun bothers her, a little headache this morning, feels her appetite increased since starting medication   Miranda [name] just started taking Esbriet 3 days ago and she has received a lot of support form the nurse sent out by the drug company.  She says that she is taking 1 pill three times per day.  She has a good understanding as to how she is to take her medicines and how to increase the dose every weak.    She says that she used to take CPAP but she hasn't used it in a few years.  Her CPAP machine is 70 years old.    She was out in the sun sometimes yesterday.     Past Medical History:  Diagnosis Date  . Anxiety   . Asthma   . CHF (congestive heart failure) ([place])    pt. unsure- but thinks she was hosp. for CHF- 2002  . [facility]  . Colon polyps   . Complication of anesthesia    states requires a lot med. to put her to sleep   . DDD (degenerative disc disease) 09/17/2011  . Depression    "sometimes "  . Family history of anesthesia complication   . GERD (gastroesophageal reflux disease)   . Glaucoma    bilateral, pt. admits that she is noncompliant to eye gtts.   . Hemorrhoids   . Hyperlipidemia   . Hypertension    had stress, echo- 2006 /w [place], Cardiac Cath, per pt.  2002, echo repeated 2012- wnl   . Low back pain   . Shortness of breath   . Sleep apnea    uses c-pap- q night recently      Review of Systems  Constitutional: Positive for fatigue. Negative for chills and fever.  HENT: Negative for postnasal drip, rhinorrhea and sinus pain.   Respiratory: Positive for shortness of breath. Negative for cough and wheezing.   Cardiovascular: Negative for chest pain, palpitations and leg swelling.       Objective:   Physical Exam Vitals:   09/02/16 1017  BP: 124/68  Pulse: 68  SpO2: 99%  Weight: 204 lb 12.8 oz (92.9 kg)  Height: 5\' 5"  (1.651 m)   RA  Gen: well appearing HENT: OP clear, TM's clear, neck supple PULM: CTA B, normal percussion CV: RRR, systolic murmur noted RUSB, trace edema GI: BS+, soft, nontender Derm: no cyanosis or rash Psyche: normal mood and affect    Labs: March 2018 LFT normal, aldolase normal, CRP slightly low, sedimentation rate slightly elevated 58, hypersensitivity pneumonitis panel negative, ANA negative, anti-Jo 1 negative, rheumatoid factor negative, SSA negative, SSB negative, SCL 70 negative  PFT Pulmonary function testing March 2017 showed a ratio of 84%, FVC  1.96 L (77% predicted), total lung capacity 3.85 L (74% predicted), DLCO 14.32 (55% predicted) March 2018 pulmonary function test ratio normal, FVC 2.25 L 90% predicted, total lung capacity 3.54 L 67% predicted, residual volume 1.26 L 56% predicted, DLCO 14.26 55% predicted  Imaging: February 2018: Images independently reviewed, compared to April 2017 there is no significant change in the peripheral and basilar predominant honeycomb change, traction bronchiectasis with significant patches of normal pulmonary parenchyma. Consistent with usual interstitial pneumonitis.     Assessment & Plan:  Therapeutic drug monitoring - Plan: Hepatic function panel  OSA (obstructive sleep apnea) - Plan: Ambulatory Referral for DME  IPF (idiopathic pulmonary  fibrosis) (Miranda [name])   Discussion: This has been a stable interval for [facility] and she has started taking Esbriet which she seems to be tolerating well.  We discussed the fact today that she has a disease that man can't cure that will continue to worsen no matter what we do.  However I explained to her that Jacklynn Bue has been shown to slow the progression of the disease so I want her to continue escalating per the routine to 3 pills.  We discussed side effects of Esbriet today.  She has a diagnosis of sleep apnea but has not been using CPAP for several years. Her machine is quite old. We do need to get her back on CPAP to mitigate cardiovascular risk from obstructive sleep apnea.  Plan: For your sleep apnea: > we will order a new CPAP machine > you may need to have a repeat sleep study  For your idiopathic pulmonary fibrosis: > keep taking Esbriet as directed by the home health nurse, increasing the dose weekly > stay out of the sun > we will check a liver function test > we will arrange for a lung function test in 11/2016 with a six minute walk test  We will see you back in September 2018   Current Outpatient Prescriptions:  .  albuterol (PROVENTIL HFA;VENTOLIN HFA) 108 (90 Base) MCG/ACT inhaler, Inhale 2 puffs into the lungs every 6 (six) hours as needed., Disp: 1 Inhaler, Rfl: 0 .  ALPRAZolam (XANAX) 0.25 MG tablet, Take 1 tablet (0.25 mg total) by mouth 3 (three) times daily as needed for anxiety or sleep., Disp: 30 tablet, Rfl: 5 .  amLODipine (NORVASC) 10 MG tablet, Take 1 tablet (10 mg total) by mouth daily., Disp: 90 tablet, Rfl: 1 .  benzonatate (TESSALON) 100 MG capsule, Take 1 capsule (100 mg total) by mouth 3 (three) times daily as needed., Disp: 20 capsule, Rfl: 0 .  cyclobenzaprine (FLEXERIL) 10 MG tablet, Take 1 tablet (10 mg total) by mouth 3 (three) times daily as needed. (Patient taking differently: Take 10 mg by mouth 3 (three) times daily as needed for muscle spasms. ), Disp:  90 tablet, Rfl: 1 .  escitalopram (LEXAPRO) 5 MG tablet, Take 1 tablet (5 mg total) by mouth daily., Disp: 30 tablet, Rfl: 0 .  Fluticasone-Salmeterol (ADVAIR) 250-50 MCG/DOSE AEPB, Inhale 1 puff into the lungs every 12 (twelve) hours., Disp: 60 each, Rfl: 5 .  furosemide (LASIX) 40 MG tablet, Take 0.5 tablets (20 mg total) by mouth daily., Disp: 30 tablet, Rfl: 0 .  HYDROcodone-acetaminophen (NORCO) 10-325 MG tablet, Take 1 tablet by mouth every 6 (six) hours as needed., Disp: 90 tablet, Rfl: 0 .  losartan (COZAAR) 50 MG tablet, Take 1 tablet (50 mg total) by mouth daily., Disp: 90 tablet, Rfl: 0 .  lovastatin (MEVACOR) 40 MG tablet, Take  1 tablet (40 mg total) by mouth at bedtime., Disp: 180 tablet, Rfl: 1 .  metoprolol tartrate (LOPRESSOR) 25 MG tablet, Take 0.5 tablets (12.5 mg total) by mouth 2 (two) times daily., Disp: 60 tablet, Rfl: 1 .  rivaroxaban (XARELTO) 20 MG TABS tablet, Take 1 tablet (20 mg total) by mouth daily with supper., Disp: 30 tablet, Rfl: 5 .  Rivaroxaban 15 & 20 MG TBPK, Take as directed on package: Start with one 15mg  tablet by mouth twice a day with food. On Day 22, switch to one 20mg  tablet once a day with food., Disp: 51 each, Rfl: 0

## 2016-09-02 NOTE — Patient Instructions (Signed)
For your sleep apnea: > we will order a new CPAP machine > you may need to have a repeat sleep study  For your idiopathic pulmonary fibrosis: > keep taking Esbriet as directed by the home health nurse, increasing the dose weekly > stay out of the sun > we will check a liver function test > we will arrange for a lung function test in 11/2016 with a six minute walk test  We will see you back in September 2018

## 2016-09-03 ENCOUNTER — Telehealth: Payer: Self-pay | Admitting: Pulmonary Disease

## 2016-09-03 DIAGNOSIS — G4733 Obstructive sleep apnea (adult) (pediatric): Secondary | ICD-10-CM

## 2016-09-03 NOTE — Telephone Encounter (Signed)
A,  Please let the patient know this was OK  Thanks,  B  ----- Pt aware of lab results.  Nothing further needed.

## 2016-09-03 NOTE — Telephone Encounter (Signed)
Split night

## 2016-09-03 NOTE — Telephone Encounter (Signed)
Sleep study is too old to order cpap off of (sleep study done in 2005).  Pt will have to have a new sleep study for cpap to be ordered.   Spoke with pt, aware of this.  BQ please advise on which sleep study you'd like to order for pt.  Thanks.

## 2016-09-03 NOTE — Telephone Encounter (Signed)
Split night ordered.  Nothing further needed.

## 2016-09-07 ENCOUNTER — Other Ambulatory Visit: Payer: Self-pay | Admitting: Pulmonary Disease

## 2016-09-11 ENCOUNTER — Other Ambulatory Visit: Payer: Self-pay | Admitting: Internal Medicine

## 2016-09-17 ENCOUNTER — Telehealth: Payer: Self-pay | Admitting: Internal Medicine

## 2016-09-17 NOTE — Telephone Encounter (Signed)
Spoke with pt re: BP issues, her readings have been high but do come down once she takes medication. She has not been taking all medications as directed. Advised pt to take all BP medications daily as directed and to monitor BP readings. If continue to be high even after taking medications as directed please contact office. Pt voiced understanding. Nothing further needed at this time.

## 2016-09-17 NOTE — Telephone Encounter (Addendum)
Pt needs new rx metoprolol tartrate 25 mg #90 w/refills [facility] [place]. Pt would like dr Raliegh Ip to know dr [name] put her on esbriet. Pt bp is 187/102 the call was transferred to [facility]

## 2016-09-21 ENCOUNTER — Telehealth: Payer: Self-pay | Admitting: Internal Medicine

## 2016-09-21 MED ORDER — METOPROLOL TARTRATE 25 MG PO TABS: 12.5000 mg | ORAL_TABLET | Freq: Two times a day (BID) | ORAL | 1 refills | Status: DC

## 2016-09-21 NOTE — Telephone Encounter (Signed)
° ° °  Pt request refill of the following:  metoprolol tartrate (LOPRESSOR) 25 MG tablet  HYDROcodone-acetaminophen (NORCO) 10-325 MG tablet   Phamacy:  [address]

## 2016-09-21 NOTE — Telephone Encounter (Signed)
HYDROcodone-acetaminophen (NORCO) 10-325 MG tablet was last refilled on 08/28/16. Rx must last 30 days. May refil medication after 09/27/16.  Lopressor will be called in as requested by pt.

## 2016-09-23 ENCOUNTER — Telehealth: Payer: Self-pay | Admitting: Internal Medicine

## 2016-09-23 NOTE — Telephone Encounter (Signed)
Pt request refill  HYDROcodone-acetaminophen (NORCO) 10-325 MG tablet  Pt would like to pick up on Friday and post date for 6/15 if possible.

## 2016-09-25 NOTE — Telephone Encounter (Signed)
Pt is aware rx may not be ready today

## 2016-09-28 MED ORDER — HYDROCODONE-ACETAMINOPHEN 10-325 MG PO TABS: 1.0000 | ORAL_TABLET | Freq: Four times a day (QID) | ORAL | 0 refills | Status: DC | PRN

## 2016-09-28 NOTE — Telephone Encounter (Signed)
Rx is printed awaiting to be signed.  

## 2016-09-28 NOTE — Telephone Encounter (Signed)
Pt notified Rx ready for pickup. Rx printed and signed.  

## 2016-09-30 ENCOUNTER — Other Ambulatory Visit: Payer: Self-pay | Admitting: Internal Medicine

## 2016-10-26 ENCOUNTER — Other Ambulatory Visit: Payer: Self-pay

## 2016-10-26 MED ORDER — PIRFENIDONE 801 MG PO TABS: 1.0000 | ORAL_TABLET | Freq: Three times a day (TID) | ORAL | 11 refills | Status: DC

## 2016-10-27 ENCOUNTER — Other Ambulatory Visit (INDEPENDENT_AMBULATORY_CARE_PROVIDER_SITE_OTHER): Payer: Medicare Other

## 2016-10-27 ENCOUNTER — Other Ambulatory Visit: Payer: Self-pay | Admitting: *Deleted

## 2016-10-27 ENCOUNTER — Telehealth: Payer: Self-pay | Admitting: Pulmonary Disease

## 2016-10-27 DIAGNOSIS — Z5181 Encounter for therapeutic drug level monitoring: Secondary | ICD-10-CM | POA: Diagnosis not present

## 2016-10-27 LAB — HEPATIC FUNCTION PANEL
ALBUMIN: 4.1 g/dL (ref 3.5–5.2)
ALT: 21 U/L (ref 0–35)
AST: 19 U/L (ref 0–37)
Alkaline Phosphatase: 61 U/L (ref 39–117)
Bilirubin, Direct: 0.1 mg/dL (ref 0.0–0.3)
TOTAL PROTEIN: 6.8 g/dL (ref 6.0–8.3)
Total Bilirubin: 0.4 mg/dL (ref 0.2–1.2)

## 2016-10-27 NOTE — Telephone Encounter (Signed)
LMTCB- I believe [facility] called patient-appears she has appt at 10am today. Unable to reach anyone in [facility] at this time to confirm.

## 2016-10-28 ENCOUNTER — Telehealth: Payer: Self-pay | Admitting: Internal Medicine

## 2016-10-28 MED ORDER — HYDROCODONE-ACETAMINOPHEN 10-325 MG PO TABS: 1.0000 | ORAL_TABLET | Freq: Four times a day (QID) | ORAL | 0 refills | Status: DC | PRN

## 2016-10-28 NOTE — Telephone Encounter (Signed)
Pt notified Rx ready for pickup. Rx printed and signed.  

## 2016-10-28 NOTE — Telephone Encounter (Signed)
lmtcb x2 for pt. 

## 2016-10-28 NOTE — Telephone Encounter (Signed)
Pt needs new rx hydrocodone. Pt is due on 10-29-16. Pt is aware md out of office thursday

## 2016-10-28 NOTE — Telephone Encounter (Signed)
° ° °  Pt request refill of the following: ° ° °HYDROcodone-acetaminophen (NORCO) 10-325 MG tablet ° ° °Phamacy: °

## 2016-10-28 NOTE — Telephone Encounter (Signed)
Rx printed awaiting to signed. 

## 2016-10-29 ENCOUNTER — Telehealth: Payer: Self-pay | Admitting: Internal Medicine

## 2016-10-29 NOTE — Telephone Encounter (Signed)
Pt came with ID & signed & picked up hydrocodone script. cb

## 2016-10-29 NOTE — Telephone Encounter (Signed)
Pt is aware rx ready for pick up.  

## 2016-10-29 NOTE — Telephone Encounter (Signed)
lmtcb x3 for pt. 

## 2016-11-02 ENCOUNTER — Other Ambulatory Visit: Payer: Self-pay | Admitting: Internal Medicine

## 2016-11-02 NOTE — Telephone Encounter (Addendum)
lmtcb x4 for pt. Will close message per triage protocol.

## 2016-11-03 ENCOUNTER — Other Ambulatory Visit: Payer: Self-pay | Admitting: Internal Medicine

## 2016-11-05 ENCOUNTER — Ambulatory Visit (HOSPITAL_BASED_OUTPATIENT_CLINIC_OR_DEPARTMENT_OTHER): Payer: Medicare Other | Attending: Pulmonary Disease | Admitting: Pulmonary Disease

## 2016-11-05 VITALS — Ht 65.0 in | Wt 205.0 lb

## 2016-11-05 DIAGNOSIS — G4736 Sleep related hypoventilation in conditions classified elsewhere: Secondary | ICD-10-CM | POA: Diagnosis not present

## 2016-11-05 DIAGNOSIS — R0683 Snoring: Secondary | ICD-10-CM | POA: Diagnosis not present

## 2016-11-05 DIAGNOSIS — Z6834 Body mass index (BMI) 34.0-34.9, adult: Secondary | ICD-10-CM | POA: Insufficient documentation

## 2016-11-05 DIAGNOSIS — G4733 Obstructive sleep apnea (adult) (pediatric): Secondary | ICD-10-CM | POA: Diagnosis not present

## 2016-11-05 DIAGNOSIS — E669 Obesity, unspecified: Secondary | ICD-10-CM | POA: Insufficient documentation

## 2016-11-09 ENCOUNTER — Ambulatory Visit (INDEPENDENT_AMBULATORY_CARE_PROVIDER_SITE_OTHER): Payer: Medicare Other | Admitting: Internal Medicine

## 2016-11-09 ENCOUNTER — Encounter: Payer: Self-pay | Admitting: Internal Medicine

## 2016-11-09 VITALS — BP 140/72 | HR 66 | Temp 98.4°F | Ht 65.0 in | Wt 207.4 lb

## 2016-11-09 DIAGNOSIS — I10 Essential (primary) hypertension: Secondary | ICD-10-CM | POA: Diagnosis not present

## 2016-11-09 DIAGNOSIS — I2782 Chronic pulmonary embolism: Secondary | ICD-10-CM | POA: Diagnosis not present

## 2016-11-09 MED ORDER — METOPROLOL TARTRATE 25 MG PO TABS: 12.5000 mg | ORAL_TABLET | Freq: Two times a day (BID) | ORAL | 1 refills | Status: DC

## 2016-11-09 MED ORDER — CYCLOBENZAPRINE HCL 10 MG PO TABS: 10.0000 mg | ORAL_TABLET | Freq: Three times a day (TID) | ORAL | 1 refills | Status: DC | PRN

## 2016-11-09 NOTE — Patient Instructions (Signed)
Follow-up pulmonary medicine as scheduled  Limit your sodium (Salt) intake  Please check your blood pressure on a regular basis.  If it is consistently greater than 150/90, please make an office appointment.  Return in 6 months for follow-up

## 2016-11-09 NOTE — Progress Notes (Signed)
Subjective:    Patient ID: Miranda [name], female    DOB: August 27, 1945, 71 y.o.   MRN: [id number]  HPI 71 year old patient who has a history of a prior pulmonary embolism in January of this year.  She remains on anticoagulation.  She has essential hypertension and is followed by pulmonary medicine for UIP. She presents with concerns about a discomfort involving the left lateral neck area.  She describes this as a "moving sensation", and due to her prior history of a pulmonary embolism was concerned about complications of a blood clot She has a history of chronic low back pain and remains on chronic narcotics. Complaints include fatigue. She is scheduled for pulmonary follow-up next month with PFTs and 6 minute walk test  Past Medical History:  Diagnosis Date  . Anxiety   . Asthma   . CHF (congestive heart failure) ([place])    pt. unsure- but thinks she was hosp. for CHF- 2002  . [facility]    [facility]  . Colon polyps   . Complication of anesthesia    states requires a lot med. to put her to sleep   . DDD (degenerative disc disease) 09/17/2011  . Depression    "sometimes "  . Family history of anesthesia complication   . GERD (gastroesophageal reflux disease)   . Glaucoma    bilateral, pt. admits that she is noncompliant to eye gtts.   . Hemorrhoids   . Hyperlipidemia   . Hypertension    had stress, echo- 2006 /w [place], Cardiac Cath, per pt. 2002, echo repeated 2012- wnl   . Low back pain   . Shortness of breath   . Sleep apnea    uses c-pap- q night recently     Social History   Social History  . Marital status: Married    Spouse name: N/A  . Number of children: N/A  . Years of education: N/A   Occupational History  . retired Quarry manager Unemployed   Social History Main Topics  . Smoking status: Never Smoker  . Smokeless tobacco: Never Used  . Alcohol use No  . Drug use: No  . Sexual activity: No   Other Topics Concern  . Not on file    Social History Narrative  . No narrative on file    Past Surgical History:  Procedure Laterality Date  . ABDOMINAL HYSTERECTOMY     ectopic, fibroids  . CARDIAC CATHETERIZATION    . COLONOSCOPY     remote  . EYE SURGERY     cataracts removed bilateral- ?IOL  . FISSURECTOMY  10/08/2011   Procedure: FISSURECTOMY;  Surgeon: Stark Klein, MD;  Location: [place];  Service: General;  Laterality: N/A;  . FLEXIBLE SIGMOIDOSCOPY  02/25/2011   Procedure: FLEXIBLE SIGMOIDOSCOPY;  Surgeon: Inda Castle, MD;  Location: [facility];  Service: Endoscopy;  Laterality: N/A;  . FOOT SURGERY     bilat, heel spurs- screw in R foot   . HEMORRHOID SURGERY  10/08/2011   Procedure: HEMORRHOIDECTOMY;  Surgeon: Stark Klein, MD;  Location: [place];  Service: General;  Laterality: N/A;  External   . SPHINCTEROTOMY  10/08/2011   Procedure: Joan Mayans;  Surgeon: Stark Klein, MD;  Location: [facility];  Service: General;  Laterality: N/A;    Family History  Problem Relation Age of Onset  . Cancer Mother        breast  . Heart attack Mother   . Heart disease Mother   . Cancer  Other        breast, colon, lung  . Heart disease Other   . Emphysema Sister   . Cancer Sister        breast  . Asthma Sister   . Heart disease Sister   . Rheum arthritis Sister   . Cancer Brother        colon  . Anesthesia problems Neg Hx     Allergies  Allergen Reactions  . Fish Oil Anaphylaxis  . Other Hives, Shortness Of Breath and Swelling    Allergic to cashew nuts and peanut oil.  Marland Kitchen Penicillins Anaphylaxis, Hives and Swelling    Has patient had a PCN reaction causing immediate rash, facial/tongue/throat swelling, SOB or lightheadedness with hypotension: Face swelling and hives started first, then swelling of the throat  Has patient had a PCN reaction causing severe rash involving mucus membranes or skin necrosis: Yes  Has patient had a PCN reaction that required hospitalization: No  Has patient had a PCN reaction  occurring within the last 10 years: Yes  If all of the above answers are "NO", then may proceed with Cephalospor  . Pneumococcal Vaccines Nausea And Vomiting  . Aspirin Itching and Rash  . Bactrim [Sulfamethoxazole-Trimethoprim] Hives, Itching and Rash  . Ciprofloxacin Hives, Itching and Rash  . Ibuprofen Rash  . Influenza Vaccines Hives  . Ivp Dye [Iodinated Diagnostic Agents] Hives, Itching and Rash    Gives benadryl to counteract symptoms  . Latex Rash  . Macrobid [Nitrofurantoin Monohydrate Macrocrystals] Hives  . Shellfish Allergy Hives    Patient also allergic to seafood    Current Outpatient Prescriptions on File Prior to Visit  Medication Sig Dispense Refill  . ALPRAZolam (XANAX) 0.25 MG tablet Take 1 tablet (0.25 mg total) by mouth 3 (three) times daily as needed for anxiety or sleep. 30 tablet 5  . amLODipine (NORVASC) 10 MG tablet Take 1 tablet (10 mg total) by mouth daily. 90 tablet 1  . amLODipine (NORVASC) 10 MG tablet TAKE ONE TABLET BY MOUTH ONCE DAILY 90 tablet 1  . benzonatate (TESSALON) 100 MG capsule Take 1 capsule (100 mg total) by mouth 3 (three) times daily as needed. 20 capsule 0  . escitalopram (LEXAPRO) 5 MG tablet Take 1 tablet (5 mg total) by mouth daily. 30 tablet 0  . Fluticasone-Salmeterol (ADVAIR) 250-50 MCG/DOSE AEPB Inhale 1 puff into the lungs every 12 (twelve) hours. 60 each 5  . furosemide (LASIX) 40 MG tablet Take 0.5 tablets (20 mg total) by mouth daily. 30 tablet 0  . HYDROcodone-acetaminophen (NORCO) 10-325 MG tablet Take 1 tablet by mouth every 6 (six) hours as needed. 90 tablet 0  . losartan (COZAAR) 50 MG tablet Take 1 tablet (50 mg total) by mouth daily. 90 tablet 0  . lovastatin (MEVACOR) 40 MG tablet Take 1 tablet (40 mg total) by mouth at bedtime. 180 tablet 1  . Pirfenidone (ESBRIET) 801 MG TABS Take 1 tablet by mouth 3 (three) times daily. 90 tablet 11  . rivaroxaban (XARELTO) 20 MG TABS tablet Take 1 tablet (20 mg total) by mouth daily  with supper. 30 tablet 5  . albuterol (PROVENTIL HFA;VENTOLIN HFA) 108 (90 Base) MCG/ACT inhaler Inhale 2 puffs into the lungs every 6 (six) hours as needed. 1 Inhaler 0   No current facility-administered medications on file prior to visit.     BP 140/72 (BP Location: Left Arm, Patient Position: Sitting, Cuff Size: Normal)   Pulse 66   Temp 98.4  F (36.9 C) (Oral)   Ht 5\' 5"  (1.651 m)   Wt 207 lb 6.4 oz (94.1 kg)   SpO2 97%   BMI 34.51 kg/m      Review of Systems  Constitutional: Positive for fatigue.  HENT: Negative for congestion, dental problem, hearing loss, rhinorrhea, sinus pressure, sore throat and tinnitus.   Eyes: Negative for pain, discharge and visual disturbance.  Respiratory: Positive for shortness of breath. Negative for cough.   Cardiovascular: Negative for chest pain, palpitations and leg swelling.  Gastrointestinal: Negative for abdominal distention, abdominal pain, blood in stool, constipation, diarrhea, nausea and vomiting.  Genitourinary: Negative for difficulty urinating, dysuria, flank pain, frequency, hematuria, pelvic pain, urgency, vaginal bleeding, vaginal discharge and vaginal pain.  Musculoskeletal: Positive for back pain. Negative for arthralgias, gait problem and joint swelling.  Skin: Negative for rash.  Neurological: Positive for weakness. Negative for dizziness, syncope, speech difficulty, numbness and headaches.  Hematological: Negative for adenopathy.  Psychiatric/Behavioral: Negative for agitation, behavioral problems and dysphoric mood. The patient is not nervous/anxious.        Objective:   Physical Exam  Constitutional: She is oriented to person, place, and time. She appears well-developed and well-nourished. No distress.  HENT:  Head: Normocephalic.  [name] Ear: External ear normal.  Left Ear: External ear normal.  Mouth/Throat: Oropharynx is clear and moist.  Eyes: Pupils are equal, round, and reactive to light. Conjunctivae and EOM  are normal.  Neck: Normal range of motion. Neck supple. No thyromegaly present.  Neck revealed no abnormalities  Cardiovascular: Normal rate, regular rhythm, normal heart sounds and intact distal pulses.   Pulmonary/Chest: Effort normal.  Decreased breath sounds lower hemithoraces O2 saturation 97%   Abdominal: Soft. Bowel sounds are normal. She exhibits no mass. There is no tenderness.  Musculoskeletal: Normal range of motion.  Lymphadenopathy:    She has no cervical adenopathy.  Neurological: She is alert and oriented to person, place, and time.  Skin: Skin is warm and dry. No rash noted.  Psychiatric: She has a normal mood and affect. Her behavior is normal.          Assessment & Plan:   History of pulmonary embolism.  Patient has been on anticoagulation for 7 months.  Will discuss with pulmonary duration of anticoagulation.  This was an unprovoked pulmonary embolism and will consider chronic anticoagulation Chronic fatigue.  Probably multi-factorial.  Patient does have DOE.  Patient encouraged to exercise as much as symptoms permit UIP Chronic low back pain Chronic narcotic use  CPX 6 months Pulmonary follow-up as scheduled with PFTs and 6 minute walk test  Nyoka Cowden

## 2016-11-10 DIAGNOSIS — G4733 Obstructive sleep apnea (adult) (pediatric): Secondary | ICD-10-CM | POA: Diagnosis not present

## 2016-11-10 NOTE — Procedures (Signed)
Patient Name: Miranda [name], Miranda [name] Date: 11/05/2016 Gender: Female D.O.B: 04/26/1945 Age (years): 9 Referring Provider: Simonne Maffucci Height (inches): 57 Interpreting Physician: Kara Mead MD, ABSM Weight (lbs): 205 RPSGT: Baxter Flattery BMI: 34 MRN: [id number] Neck Size: 14.50   CLINICAL INFORMATION Sleep Study Type: NPSG  Indication for sleep study: Obesity, OSA, Snoring, Witnessed Apneas. She was diagnosed with OSA in 2005 but has not used her CPAP in many years  Epworth Sleepiness Score: 7     SLEEP STUDY TECHNIQUE As per the AASM Manual for the Scoring of Sleep and Associated Events v2.3 (April 2016) with a hypopnea requiring 4% desaturations.  The channels recorded and monitored were frontal, central and occipital EEG, electrooculogram (EOG), submentalis EMG (chin), nasal and oral airflow, thoracic and abdominal wall motion, anterior tibialis EMG, snore microphone, electrocardiogram, and pulse oximetry.  SLEEP ARCHITECTURE The study was initiated at 10:56:45 PM and ended at 4:59:44 AM.  Sleep onset time was 7.1 minutes and the sleep efficiency was 80.3%. The total sleep time was 291.5 minutes.  Stage REM latency was 87.5 minutes.  The patient spent 6.69% of the night in stage N1 sleep, 53.17% in stage N2 sleep, 0.00% in stage N3 and 40.14% in REM.  Alpha intrusion was absent.  Supine sleep was 16.12%.  RESPIRATORY PARAMETERS The overall apnea/hypopnea index (AHI) was 10.5 per hour. There were 12 total apneas, including 10 obstructive, 2 central and 0 mixed apneas. There were 39 hypopneas and 0 RERAs.  The AHI during Stage REM sleep was 24.1 per hour.  AHI while supine was 0.0 per hour.  The mean oxygen saturation was 94.43%. The minimum SpO2 during sleep was 79.00%.  Loud snoring was noted during this study.  CARDIAC DATA The 2 lead EKG demonstrated sinus rhythm. The mean heart rate was 55.69 beats per minute. Other EKG findings include: None.   LEG  MOVEMENT DATA The total PLMS were 4 with a resulting PLMS index of 0.82. Associated arousal with leg movement index was 0.0 .  IMPRESSIONS - Mild obstructive sleep apnea occurred during this study (AHI = 10.5/h). Events were only noted during REM sleep. - No significant central sleep apnea occurred during this study (CAI = 0.4/h). - Moderate oxygen desaturation was noted during this study (Min O2 = 79.00%). - The patient snored with Loud snoring volume. - No cardiac abnormalities were noted during this study. - Clinically significant periodic limb movements did not occur during sleep. No significant associated arousals.   DIAGNOSIS - Obstructive Sleep Apnea (327.23 [G47.33 ICD-10]) - Nocturnal Hypoxemia (327.26 [G47.36 ICD-10]) Which is likely related to underlying pulmonary disease (ILD & PE)   RECOMMENDATIONS - The cardiovascular implications of REM related OSA are debatable. Treatment options include no treatment and weight maintenance alone or if symptomatic, can pursue oral appliance or CPAP therapy. - Consider oxygen supplementation therapy especially if she desaturates on ambulation - Avoid alcohol, sedatives and other CNS depressants that may worsen sleep apnea and disrupt normal sleep architecture. - Sleep hygiene should be reviewed to assess factors that may improve sleep quality. - Weight management and regular exercise should be initiated or continued if appropriate.   Kara Mead MD Board Certified in [place]

## 2016-11-25 ENCOUNTER — Telehealth: Payer: Self-pay | Admitting: Internal Medicine

## 2016-11-25 MED ORDER — HYDROCODONE-ACETAMINOPHEN 10-325 MG PO TABS: 1.0000 | ORAL_TABLET | Freq: Four times a day (QID) | ORAL | 0 refills | Status: DC | PRN

## 2016-11-25 NOTE — Telephone Encounter (Signed)
Medication is due to be refilled on 11/28/16, Please advise

## 2016-11-25 NOTE — Telephone Encounter (Signed)
Okay for early refill.

## 2016-11-25 NOTE — Telephone Encounter (Signed)
° ° ° °  PT ASKED IF SHE COULD GET THE BELOW EARLY SINCE THE WEATHER IS SUPPOSE TO BE BAD    Pt request refill of the following:  HYDROcodone-acetaminophen (NORCO) 10-325 MG tablet   Phamacy:

## 2016-11-26 ENCOUNTER — Other Ambulatory Visit: Payer: Self-pay | Admitting: Internal Medicine

## 2016-12-07 ENCOUNTER — Encounter: Payer: Self-pay | Admitting: Pulmonary Disease

## 2016-12-07 ENCOUNTER — Ambulatory Visit (INDEPENDENT_AMBULATORY_CARE_PROVIDER_SITE_OTHER): Payer: Medicare Other | Admitting: Internal Medicine

## 2016-12-07 ENCOUNTER — Ambulatory Visit (INDEPENDENT_AMBULATORY_CARE_PROVIDER_SITE_OTHER): Payer: Medicare Other | Admitting: Pulmonary Disease

## 2016-12-07 ENCOUNTER — Ambulatory Visit (INDEPENDENT_AMBULATORY_CARE_PROVIDER_SITE_OTHER): Payer: Medicare Other | Admitting: *Deleted

## 2016-12-07 ENCOUNTER — Encounter: Payer: Self-pay | Admitting: Internal Medicine

## 2016-12-07 VITALS — BP 122/62 | HR 59 | Ht 65.0 in | Wt 202.0 lb

## 2016-12-07 DIAGNOSIS — J189 Pneumonia, unspecified organism: Secondary | ICD-10-CM

## 2016-12-07 DIAGNOSIS — J84112 Idiopathic pulmonary fibrosis: Secondary | ICD-10-CM

## 2016-12-07 DIAGNOSIS — G4733 Obstructive sleep apnea (adult) (pediatric): Secondary | ICD-10-CM | POA: Diagnosis not present

## 2016-12-07 DIAGNOSIS — R7309 Other abnormal glucose: Secondary | ICD-10-CM | POA: Diagnosis not present

## 2016-12-07 LAB — PULMONARY FUNCTION TEST
DL/VA % pred: 83 %
DL/VA: 4.11 ml/min/mmHg/L
DLCO COR % PRED: 58 %
DLCO UNC: 15.38 ml/min/mmHg
DLCO cor: 14.86 ml/min/mmHg
DLCO unc % pred: 60 %
FEF 25-75 PRE: 2.19 L/s
FEF 25-75 Post: 3.23 L/sec
FEF2575-%CHANGE-POST: 47 %
FEF2575-%PRED-POST: 183 %
FEF2575-%Pred-Pre: 124 %
FEV1-%CHANGE-POST: 8 %
FEV1-%Pred-Post: 94 %
FEV1-%Pred-Pre: 86 %
FEV1-POST: 1.81 L
FEV1-Pre: 1.67 L
FEV1FVC-%Change-Post: 3 %
FEV1FVC-%Pred-Pre: 111 %
FEV6-%Change-Post: 5 %
FEV6-%PRED-POST: 85 %
FEV6-%Pred-Pre: 81 %
FEV6-POST: 2.05 L
FEV6-PRE: 1.95 L
FEV6FVC-%PRED-POST: 104 %
FEV6FVC-%PRED-PRE: 104 %
FVC-%CHANGE-POST: 5 %
FVC-%PRED-POST: 82 %
FVC-%PRED-PRE: 78 %
FVC-POST: 2.05 L
FVC-PRE: 1.95 L
PRE FEV6/FVC RATIO: 100 %
Post FEV1/FVC ratio: 88 %
Post FEV6/FVC ratio: 100 %
Pre FEV1/FVC ratio: 86 %
RV % pred: 81 %
RV: 1.82 L
TLC % pred: 74 %
TLC: 3.87 L

## 2016-12-07 LAB — POCT GLYCOSYLATED HEMOGLOBIN (HGB A1C): Hemoglobin A1C: 5.9

## 2016-12-07 MED ORDER — LOVASTATIN 40 MG PO TABS: 40.0000 mg | ORAL_TABLET | Freq: Every day | ORAL | 4 refills | Status: DC

## 2016-12-07 MED ORDER — METFORMIN HCL ER 500 MG PO TB24: 1000.0000 mg | ORAL_TABLET | Freq: Every day | ORAL | 3 refills | Status: DC

## 2016-12-07 MED ORDER — LOSARTAN POTASSIUM 100 MG PO TABS: 100.0000 mg | ORAL_TABLET | Freq: Every day | ORAL | 3 refills | Status: DC

## 2016-12-07 MED ORDER — METOPROLOL TARTRATE 25 MG PO TABS: 25.0000 mg | ORAL_TABLET | Freq: Two times a day (BID) | ORAL | 6 refills | Status: DC

## 2016-12-07 NOTE — Progress Notes (Signed)
SIX MIN WALK 12/07/2016  Medications Lasix 40 mg, Cozaar 50mg , Lovastatin 40mg , Metoprolol 25mg , Pirfenidone 801 mg all at 9:30, Xarelto 20mg   10:00am  Supplimental Oxygen during Test? (L/min) No  Laps 7  Partial Lap (in Meters) 12  Baseline BP (sitting) 130/64  Baseline Heartrate 58  Baseline Dyspnea (Borg Scale) 1  Baseline Fatigue (Borg Scale) 4  Baseline SPO2 100  BP (sitting) 140/60  Heartrate 65  Dyspnea (Borg Scale) 7  Fatigue (Borg Scale) 7  SPO2 93  BP (sitting) 126/62  Heartrate 53  SPO2 98  Stopped or Paused before Six Minutes No  Distance Completed 348  Tech Comments: Pt caomplained of beinf really fatigued during the walk TA/CMA

## 2016-12-07 NOTE — Patient Instructions (Signed)
For idiopathic pulmonary fibrosis: Continue taking Esbriet one pill 3 times a day We will get a liver function test on the next visit Today's lung function tests looked good  For fatigue: I believe this is due to sleep apnea We will restart CPAP, order sent today  For high blood sugar: Talk to your primary care doctor about this  We will see you back in 3 months or sooner if needed

## 2016-12-07 NOTE — Progress Notes (Signed)
PFT done today. 

## 2016-12-07 NOTE — Patient Instructions (Addendum)
Diabetes Mellitus and Exercise Exercising regularly is important for your overall health, especially when you have diabetes (diabetes mellitus). Exercising is not only about losing weight. It has many health benefits, such as increasing muscle strength and bone density and reducing body fat and stress. This leads to improved fitness, flexibility, and endurance, all of which result in better overall health. Exercise has additional benefits for people with diabetes, including:  Reducing appetite.  Helping to lower and control blood glucose.  Lowering blood pressure.  Helping to control amounts of fatty substances (lipids) in the blood, such as cholesterol and triglycerides.  Helping the body to respond better to insulin (improving insulin sensitivity).  Reducing how much insulin the body needs.  Decreasing the risk for heart disease by: ? Lowering cholesterol and triglyceride levels. ? Increasing the levels of good cholesterol. ? Lowering blood glucose levels.  What is my activity plan? Your health care provider or certified diabetes educator can help you make a plan for the type and frequency of exercise (activity plan) that works for you. Make sure that you:  Do at least 150 minutes of moderate-intensity or vigorous-intensity exercise each week. This could be brisk walking, biking, or water aerobics. ? Do stretching and strength exercises, such as yoga or weightlifting, at least 2 times a week. ? Spread out your activity over at least 3 days of the week.  Get some form of physical activity every day. ? Do not go more than 2 days in a row without some kind of physical activity. ? Avoid being inactive for more than 90 minutes at a time. Take frequent breaks to walk or stretch.  Choose a type of exercise or activity that you enjoy, and set realistic goals.  Start slowly, and gradually increase the intensity of your exercise over time.  What do I need to know about managing my  diabetes?  Check your blood glucose before and after exercising. ? If your blood glucose is higher than 240 mg/dL (13.3 mmol/L) before you exercise, check your urine for ketones. If you have ketones in your urine, do not exercise until your blood glucose returns to normal.  Know the symptoms of low blood glucose (hypoglycemia) and how to treat it. Your risk for hypoglycemia increases during and after exercise. Common symptoms of hypoglycemia can include: ? Hunger. ? Anxiety. ? Sweating and feeling clammy. ? Confusion. ? Dizziness or feeling light-headed. ? Increased heart rate or palpitations. ? Blurry vision. ? Tingling or numbness around the mouth, lips, or tongue. ? Tremors or shakes. ? Irritability.  Keep a rapid-acting carbohydrate snack available before, during, and after exercise to help prevent or treat hypoglycemia.  Avoid injecting insulin into areas of the body that are going to be exercised. For example, avoid injecting insulin into: ? The arms, when playing tennis. ? The legs, when jogging.  Keep records of your exercise habits. Doing this can help you and your health care provider adjust your diabetes management plan as needed. Write down: ? Food that you eat before and after you exercise. ? Blood glucose levels before and after you exercise. ? The type and amount of exercise you have done. ? When your insulin is expected to peak, if you use insulin. Avoid exercising at times when your insulin is peaking.  When you start a new exercise or activity, work with your health care provider to make sure the activity is safe for you, and to adjust your insulin, medicines, or food intake as needed.    Drink plenty of water while you exercise to prevent dehydration or heat stroke. Drink enough fluid to keep your urine clear or pale yellow. This information is not intended to replace advice given to you by your health care provider. Make sure you discuss any questions you have with  your health care provider. Document Released: 05/23/2003 Document Revised: 09/20/2015 Document Reviewed: 08/12/2015 Elsevier Interactive Patient Education  2018 [employer]. Diabetes Mellitus and Food It is important for you to manage your blood sugar (glucose) level. Your blood glucose level can be greatly affected by what you eat. Eating healthier foods in the appropriate amounts throughout the day at about the same time each day will help you control your blood glucose level. It can also help slow or prevent worsening of your diabetes mellitus. Healthy eating may even help you improve the level of your blood pressure and reach or maintain a healthy weight. General recommendations for healthful eating and cooking habits include:  Eating meals and snacks regularly. Avoid going long periods of time without eating to lose weight.  Eating a diet that consists mainly of plant-based foods, such as fruits, vegetables, nuts, legumes, and whole grains.  Using low-heat cooking methods, such as baking, instead of high-heat cooking methods, such as deep frying.  Work with your dietitian to make sure you understand how to use the Nutrition Facts information on food labels. How can food affect me? Carbohydrates Carbohydrates affect your blood glucose level more than any other type of food. Your dietitian will help you determine how many carbohydrates to eat at each meal and teach you how to count carbohydrates. Counting carbohydrates is important to keep your blood glucose at a healthy level, especially if you are using insulin or taking certain medicines for diabetes mellitus. Alcohol Alcohol can cause sudden decreases in blood glucose (hypoglycemia), especially if you use insulin or take certain medicines for diabetes mellitus. Hypoglycemia can be a life-threatening condition. Symptoms of hypoglycemia (sleepiness, dizziness, and disorientation) are similar to symptoms of having too much alcohol. If your  health care provider has given you approval to drink alcohol, do so in moderation and use the following guidelines:  Women should not have more than one drink per day, and men should not have more than two drinks per day. One drink is equal to: ? 12 oz of beer. ? 5 oz of wine. ? 1 oz of hard liquor.  Do not drink on an empty stomach.  Keep yourself hydrated. Have water, diet soda, or unsweetened iced tea.  Regular soda, juice, and other mixers might contain a lot of carbohydrates and should be counted.  What foods are not recommended? As you make food choices, it is important to remember that all foods are not the same. Some foods have fewer nutrients per serving than other foods, even though they might have the same number of calories or carbohydrates. It is difficult to get your body what it needs when you eat foods with fewer nutrients. Examples of foods that you should avoid that are high in calories and carbohydrates but low in nutrients include:  Trans fats (most processed foods list trans fats on the Nutrition Facts label).  Regular soda.  Juice.  Candy.  Sweets, such as cake, pie, doughnuts, and cookies.  Fried foods.  What foods can I eat? Eat nutrient-rich foods, which will nourish your body and keep you healthy. The food you should eat also will depend on several factors, including:  The calories you need.  The medicines you take.  Your weight.  Your blood glucose level.  Your blood pressure level.  Your cholesterol level.  You should eat a variety of foods, including:  Protein. ? Lean cuts of meat. ? Proteins low in saturated fats, such as fish, egg whites, and beans. Avoid processed meats.  Fruits and vegetables. ? Fruits and vegetables that may help control blood glucose levels, such as apples, mangoes, and yams.  Dairy products. ? Choose fat-free or low-fat dairy products, such as milk, yogurt, and cheese.  Grains, bread, pasta, and  rice. ? Choose whole grain products, such as multigrain bread, whole oats, and brown rice. These foods may help control blood pressure.  Fats. ? Foods containing healthful fats, such as nuts, avocado, olive oil, canola oil, and fish.  Does everyone with diabetes mellitus have the same meal plan? Because every person with diabetes mellitus is different, there is not one meal plan that works for everyone. It is very important that you meet with a dietitian who will help you create a meal plan that is just right for you. This information is not intended to replace advice given to you by your health care provider. Make sure you discuss any questions you have with your health care provider. Document Released: 11/27/2004 Document Revised: 08/08/2015 Document Reviewed: 01/27/2013 Elsevier Interactive Patient Education  2017 [place] called impaired glucose tolerance or impaired fasting glucose-is a condition that causes blood sugar (blood glucose) levels to be higher than normal. Following a healthy diet can help to keep prediabetes under control. It can also help to lower the risk of type 2 diabetes and heart disease, which are increased in people who have prediabetes. Along with regular exercise, a healthy diet:  Promotes weight loss.  Helps to control blood sugar levels.  Helps to improve the way that the body uses insulin.  What do I need to know about this eating plan?  Use the glycemic index (GI) to plan your meals. The index tells you how quickly a food will raise your blood sugar. Choose low-GI foods. These foods take a longer time to raise blood sugar.  Pay close attention to the amount of carbohydrates in the food that you eat. Carbohydrates increase blood sugar levels.  Keep track of how many calories you take in. Eating the right amount of calories will help you to achieve a healthy weight. Losing about 7 percent of your starting weight  can help to prevent type 2 diabetes.  You may want to follow a Mediterranean diet. This diet includes a lot of vegetables, lean meats or fish, whole grains, fruits, and healthy oils and fats. What foods can I eat? Grains Whole grains, such as whole-wheat or whole-grain breads, crackers, cereals, and pasta. Unsweetened oatmeal. Bulgur. Barley. Quinoa. Brown rice. Corn or whole-wheat flour tortillas or taco shells. Vegetables Lettuce. Spinach. Peas. Beets. Cauliflower. Cabbage. Broccoli. Carrots. Tomatoes. Squash. Eggplant. Herbs. Peppers. Onions. Cucumbers. Brussels sprouts. Fruits Berries. Bananas. Apples. Oranges. Grapes. Papaya. Mango. Pomegranate. Kiwi. Grapefruit. Cherries. Meats and Other Protein Sources Seafood. Lean meats, such as chicken and [place] or lean cuts of pork and beef. Tofu. Eggs. Nuts. Beans. Dairy Low-fat or fat-free dairy products, such as yogurt, cottage cheese, and cheese. Beverages Water. Tea. Coffee. Sugar-free or diet soda. Seltzer water. Milk. Milk alternatives, such as soy or almond milk. Condiments Mustard. Relish. Low-fat, low-sugar ketchup. Low-fat, low-sugar barbecue sauce. Low-fat or fat-free mayonnaise. Sweets and Desserts Sugar-free or low-fat pudding. Sugar-free or  low-fat ice cream and other frozen treats. Fats and Oils Avocado. Walnuts. Olive oil. The items listed above may not be a complete list of recommended foods or beverages. Contact your dietitian for more options. What foods are not recommended? Grains Refined white flour and flour products, such as bread, pasta, snack foods, and cereals. Beverages Sweetened drinks, such as sweet iced tea and soda. Sweets and Desserts Baked goods, such as cake, cupcakes, pastries, cookies, and cheesecake. The items listed above may not be a complete list of foods and beverages to avoid. Contact your dietitian for more information. This information is not intended to replace advice given to you by your health  care provider. Make sure you discuss any questions you have with your health care provider. Document Released: 07/17/2014 Document Revised: 08/08/2015 Document Reviewed: 03/28/2014 Elsevier Interactive Patient Education  2017 [place] for Eating Away From Home If You Have Diabetes Controlling your level of blood glucose, also known as blood sugar, can be challenging. It can be even more difficult when you do not prepare your own meals. The following tips can help you manage your diabetes when you eat away from home. Planning ahead Plan ahead if you know you will be eating away from home:  Ask your health care provider how to time meals and medicine if you are taking insulin.  Make a list of restaurants near you that offer healthy choices. If they have a carry-out menu, take it home and plan what you will order ahead of time.  Look up the restaurant you want to eat at online. Many chain and fast-food restaurants list nutritional information online. Use this information to choose the healthiest options and to calculate how many carbohydrates will be in your meal.  Use a carbohydrate-counting book or mobile app to look up the carbohydrate content and serving size of the foods you want to eat.  Become familiar with serving sizes and learn to recognize how many servings are in a portion. This will allow you to estimate how many carbohydrates you can eat.  Free foods A "free food" is any food or drink that has less than 5 g of carbohydrates per serving. Free foods include:  Many vegetables.  Hard boiled eggs.  Nuts or seeds.  Olives.  Cheeses.  Meats.  These types of foods make good appetizer choices and are often available at salad bars. Lemon juice, vinegar, or a low-calorie salad dressing of fewer than 20 calories per serving can be used as a "free" salad dressing. Choices to reduce carbohydrates  Substitute nonfat sweetened yogurt with a sugar-free yogurt. Yogurt made  from soy milk may also be used, but you will still want a sugar-free or plain option to choose a lower carbohydrate amount.  Ask your server to take away the bread basket or chips from your table.  Order fresh fruit. A salad bar often offers fresh fruit choices. Avoid canned fruit because it is usually packed in sugar or syrup.  Order a salad, and eat it without dressing. Or, create a "free" salad dressing.  Ask for substitutions. For example, instead of [place] fries, request an order of a vegetable such as salad, green beans, or broccoli. Other tips  If you take insulin, take the insulin once your food arrives to your table. This will ensure your insulin and food are timed correctly.  Ask your server about the portion size before your order, and ask for a take-out box if the portion has more servings  than you should have. When your food comes, leave the amount you should have on the plate, and put the rest in the take-out box.  Consider splitting an entree with someone and ordering a side salad. This information is not intended to replace advice given to you by your health care provider. Make sure you discuss any questions you have with your health care provider. Document Released: 03/02/2005 Document Revised: 08/08/2015 Document Reviewed: 05/30/2013 Elsevier Interactive Patient Education  2018 [employer].  Type 2 Diabetes Mellitus, Diagnosis, Adult Type 2 diabetes (type 2 diabetes mellitus) is a long-term (chronic) disease. In type 2 diabetes, one or both of these problems may be present:  The pancreas does not make enough of a hormone called insulin.  Cells in the body do not respond properly to insulin that the body makes (insulin resistance).  Normally, insulin allows blood sugar (glucose) to enter cells in the body. The cells use glucose for energy. Insulin resistance or lack of insulin causes excess glucose to build up in the blood instead of going into cells. As a result, high  blood glucose (hyperglycemia) develops. What increases the risk? The following factors may make you more likely to develop type 2 diabetes:  Having a family member with type 2 diabetes.  Being overweight or obese.  Having an inactive (sedentary) lifestyle.  Having been diagnosed with insulin resistance.  Having a history of prediabetes, gestational diabetes, or polycystic ovarian syndrome (PCOS).  Being of American-Indian, African-American, Hispanic/Latino, or Asian/Pacific Islander descent.  What are the signs or symptoms? In the early stage of this condition, you may not have symptoms. Symptoms develop slowly and may include:  Increased thirst (polydipsia).  Increased hunger(polyphagia).  Increased urination (polyuria).  Increased urination during the night (nocturia).  Unexplained weight loss.  Frequent infections that keep coming back (recurring).  Fatigue.  Weakness.  Vision changes, such as blurry vision.  Cuts or bruises that are slow to heal.  Tingling or numbness in the hands or feet.  Dark patches on the skin (acanthosis nigricans).  How is this diagnosed?  This condition is diagnosed based on your symptoms, your medical history, a physical exam, and your blood glucose level. Your blood glucose may be checked with one or more of the following blood tests:  A fasting blood glucose (FBG) test. You will not be allowed to eat (you will fast) for at least 8 hours before a blood sample is taken.  A random blood glucose test. This checks blood glucose at any time of day regardless of when you ate.  An A1c (hemoglobin A1c) blood test. This provides information about blood glucose control over the previous 2-3 months.  An oral glucose tolerance test (OGTT). This measures your blood glucose at two times: ? After fasting. This is your baseline blood glucose level. ? Two hours after drinking a beverage that contains glucose.  You may be diagnosed with type 2  diabetes if:  Your FBG level is 126 mg/dL (7.0 mmol/L) or higher.  Your random blood glucose level is 200 mg/dL (11.1 mmol/L) or higher.  Your A1c level is 6.5% or higher.  Your OGGT result is higher than 200 mg/dL (11.1 mmol/L).  These blood tests may be repeated to confirm your diagnosis. How is this treated?  Your treatment may be managed by a specialist called an endocrinologist. Type 2 diabetes may be treated by following instructions from your health care provider about:  Making diet and lifestyle changes. This may include: ? Following  an individualized nutrition plan that is developed by a diet and nutrition specialist (registered dietitian). ? Exercising regularly. ? Finding ways to manage stress.  Checking your blood glucose level as often as directed.  Taking diabetes medicines or insulin daily. This helps to keep your blood glucose levels in the healthy range. ? If you use insulin, you may need to adjust the dosage depending on how physically active you are and what foods you eat. Your health care provider will tell you how to adjust your dosage.  Taking medicines to help prevent complications from diabetes, such as: ? Aspirin. ? Medicine to lower cholesterol. ? Medicine to control blood pressure.  Your health care provider will set individualized treatment goals for you. Your goals will be based on your age, other medical conditions you have, and how you respond to diabetes treatment. Generally, the goal of treatment is to maintain the following blood glucose levels:  Before meals (preprandial): 80-130 mg/dL (4.4-7.2 mmol/L).  After meals (postprandial): below 180 mg/dL (10 mmol/L).  A1c level: less than 7%.  Follow these instructions at home: Questions to [facility] Provider Consider asking the following questions:  Do I need to meet with a diabetes educator?  Where can I find a support group for people with diabetes?  What equipment will I need to  manage my diabetes at home?  What diabetes medicines do I need, and when should I take them?  How often do I need to check my blood glucose?  What number can I call if I have questions?  When is my next appointment?  General instructions  Take over-the-counter and prescription medicines only as told by your health care provider.  Keep all follow-up visits as told by your health care provider. This is important.  For more information about diabetes, visit: ? American Diabetes Association (ADA): www.diabetes.org ? American Association of Diabetes Educators (AADE): www.diabeteseducator.org/patient-resources Contact a health care provider if:  Your blood glucose is at or above 240 mg/dL (13.3 mmol/L) for 2 days in a row.  You have been sick or have had a fever for 2 days or longer and you are not getting better.  You have any of the following problems for more than 6 hours: ? You cannot eat or drink. ? You have nausea and vomiting. ? You have diarrhea. Get help right away if:  Your blood glucose is lower than 54 mg/dL (3.0 mmol/L).  You become confused or you have trouble thinking clearly.  You have difficulty breathing.  You have moderate or large ketone levels in your urine.   Return for earlier office appointment.  If blood sugars are consistently greater than 160

## 2016-12-07 NOTE — Progress Notes (Signed)
Subjective:    Patient ID: Miranda [name], female    DOB: Aug 03, 1945, 71 y.o.   MRN: [id number]  Synopsis: Former patient of Dr. Gwenette Greet who was hospitalized in January 2017 with pneumonia and had findings on her CT chest which were worrisome for pulmonary fibrosis.In February 2018 she had a CT angiogram of her chest diagnosing a pulmonary embolism. This is felt to be idiopathic. A follow-up CT scan of the chest in March 2018 showed usual interstitial pneumonitis.  Serologic blood work showed no evidence of a connective tissue disease.    HPI Chief Complaint  Patient presents with  . Follow-up    PFT done today.  Pt c/o chest tightness and fatigue due to sugar being elevated; 291 just now in the room. Occ. cough with clear mucus.   Miranda [name] has been tired and fatigued all the time.     She continues to take the Esbriet 1 pills (817mcg) three times per day.  No diarrhea. No upset stomach.  Some constipation from time to time.  She did not get the CPAP machine we ordered.  She is having trouble sleepin and has bddn taking benadryl or tyelnol PM. She had a sleep apnea test   She has been checking her blood sugar more lately and her numbers have been higher than 300 at home.  Today it was 291.     Past Medical History:  Diagnosis Date  . Anxiety   . Asthma   . CHF (congestive heart failure) ([place])    pt. unsure- but thinks she was hosp. for CHF- 2002  . [facility]  . Colon polyps   . Complication of anesthesia    states requires a lot med. to put her to sleep   . DDD (degenerative disc disease) 09/17/2011  . Depression    "sometimes "  . Family history of anesthesia complication   . GERD (gastroesophageal reflux disease)   . Glaucoma    bilateral, pt. admits that she is noncompliant to eye gtts.   . Hemorrhoids   . Hyperlipidemia   . Hypertension    had stress, echo- 2006 /w [place], Cardiac Cath, per pt. 2002, echo repeated 2012- wnl   .  Low back pain   . Shortness of breath   . Sleep apnea    uses c-pap- q night recently      Review of Systems  Constitutional: Positive for fatigue. Negative for chills and fever.  HENT: Negative for postnasal drip, rhinorrhea and sinus pain.   Respiratory: Positive for shortness of breath. Negative for cough and wheezing.   Cardiovascular: Negative for chest pain, palpitations and leg swelling.       Objective:   Physical Exam Vitals:   12/07/16 1223  BP: 122/62  Pulse: (!) 59  SpO2: 99%  Weight: 202 lb (91.6 kg)  Height: 5\' 5"  (1.651 m)   RA  Gen: well appearing HENT: OP clear, TM's clear, neck supple PULM: Crackles bases B, normal percussion CV: RRR, no mgr, trace edema GI: BS+, soft, nontender Derm: no cyanosis or rash Psyche: normal mood and affect   Sleep study: 10/2016 PSG> AHI 10.5   Labs: March 2018 LFT normal, aldolase normal, CRP slightly low, sedimentation rate slightly elevated 58, hypersensitivity pneumonitis panel negative, ANA negative, anti-Jo 1 negative, rheumatoid factor negative, SSA negative, SSB negative, SCL 70 negative  PFT Pulmonary function testing March 2017 showed a ratio of 84%, FVC 1.96 L (77%  predicted), total lung capacity 3.85 L (74% predicted), DLCO 14.32 (55% predicted) March 2018 pulmonary function test ratio normal, FVC 2.25 L 90% predicted, total lung capacity 3.54 L 67% predicted, residual volume 1.26 L 56% predicted, DLCO 14.26 55% predicted September 2018 ratio normal, FVC 2.05 L 82% predicted, total lung capacity 3.87 L 74% predicted, DLCO 15.38 60%   Imaging: February 2018: Images independently reviewed, compared to April 2017 there is no significant change in the peripheral and basilar predominant honeycomb change, traction bronchiectasis with significant patches of normal pulmonary parenchyma. Consistent with usual interstitial pneumonitis.     Assessment & Plan:  OSA (obstructive sleep apnea) - Plan: Ambulatory Referral  for DME  IPF (idiopathic pulmonary fibrosis) ([place])  Community acquired pneumonia, unspecified laterality  Discussion: Miranda [name] is stable from a lung function standpoint today, and she is not having any signs or symptoms suggestive of toxicity from [place]. However, she is having a lot of fatigue which I think is due to untreated sleep apnea and perhaps her hyperglycemia.  She is interested in participating in the registry for patients with idiopathic pulmonary fibrosis. We discussed this today.  Plan: For idiopathic pulmonary fibrosis: Continue taking Esbriet one pill 3 times a day We will get a liver function test on the next visit Today's lung function tests looked good  For fatigue: I believe this is due to sleep apnea We will restart CPAP, order sent today  For high blood sugar: Talk to your primary care doctor about this  We will see you back in 3 months or sooner if needed   Current Outpatient Prescriptions:  .  albuterol (PROVENTIL HFA;VENTOLIN HFA) 108 (90 Base) MCG/ACT inhaler, Inhale 1-2 puffs into the lungs every 6 (six) hours as needed for wheezing or shortness of breath., Disp: , Rfl:  .  ALPRAZolam (XANAX) 0.25 MG tablet, Take 1 tablet (0.25 mg total) by mouth 3 (three) times daily as needed for anxiety or sleep., Disp: 30 tablet, Rfl: 5 .  amLODipine (NORVASC) 10 MG tablet, Take 1 tablet (10 mg total) by mouth daily., Disp: 90 tablet, Rfl: 1 .  CONSTULOSE 10 GM/15ML solution, TAKE 30 MLS BY MOUTH 3 TIMES DAILY AS NEEDED FOR MILD CONSTIPATION, Disp: 473 mL, Rfl: 1 .  cyclobenzaprine (FLEXERIL) 10 MG tablet, Take 1 tablet (10 mg total) by mouth 3 (three) times daily as needed., Disp: 90 tablet, Rfl: 1 .  Fluticasone-Salmeterol (ADVAIR) 250-50 MCG/DOSE AEPB, Inhale 1 puff into the lungs every 12 (twelve) hours., Disp: 60 each, Rfl: 5 .  furosemide (LASIX) 40 MG tablet, Take 0.5 tablets (20 mg total) by mouth daily., Disp: 30 tablet, Rfl: 0 .  HYDROcodone-acetaminophen  (NORCO) 10-325 MG tablet, Take 1 tablet by mouth every 6 (six) hours as needed., Disp: 90 tablet, Rfl: 0 .  losartan (COZAAR) 50 MG tablet, TAKE ONE TABLET BY MOUTH ONCE DAILY, Disp: 90 tablet, Rfl: 1 .  metoprolol tartrate (LOPRESSOR) 25 MG tablet, Take 0.5 tablets (12.5 mg total) by mouth 2 (two) times daily., Disp: 60 tablet, Rfl: 1 .  Pirfenidone (ESBRIET) 801 MG TABS, Take 1 tablet by mouth 3 (three) times daily., Disp: 90 tablet, Rfl: 11 .  rivaroxaban (XARELTO) 20 MG TABS tablet, Take 1 tablet (20 mg total) by mouth daily with supper., Disp: 30 tablet, Rfl: 5 .  albuterol (PROVENTIL HFA;VENTOLIN HFA) 108 (90 Base) MCG/ACT inhaler, Inhale 2 puffs into the lungs every 6 (six) hours as needed., Disp: 1 Inhaler, Rfl: 0 .  escitalopram (LEXAPRO)  5 MG tablet, Take 1 tablet (5 mg total) by mouth daily. (Patient not taking: Reported on 12/07/2016), Disp: 30 tablet, Rfl: 0 .  lovastatin (MEVACOR) 40 MG tablet, Take 1 tablet (40 mg total) by mouth at bedtime. (Patient not taking: Reported on 12/07/2016), Disp: 180 tablet, Rfl: 1

## 2016-12-07 NOTE — Progress Notes (Signed)
Subjective:    Patient ID: Miranda [name], female    DOB: 28-Aug-1945, 71 y.o.   MRN: [id number]  HPI  71 year old patient who was noted have some impaired glucose tolerance during a recent hospital admission in February of this year.  She has a strong family history of diabetes with 3 sisters affected and also a daughter who is insulin-dependent.  Her husband also has had diabetes and she does have a home glucometer.  She was seen by pulmonary medicine recently and noted to have hyperglycemia.  A blood sugar at home was also in the to 90 range.  She denies any hyperglycemic symptoms.  She already is on a statin drug  Lab Results  Component Value Date   HGBA1C 5.9 12/07/2016   She has hypertension on triple therapy.  She states blood pressure readings at home, often revealed an elevated systolic reading  Past Medical History:  Diagnosis Date  . Anxiety   . Asthma   . CHF (congestive heart failure) ([place])    pt. unsure- but thinks she was hosp. for CHF- 2002  . [facility]  . Colon polyps   . Complication of anesthesia    states requires a lot med. to put her to sleep   . DDD (degenerative disc disease) 09/17/2011  . Depression    "sometimes "  . Family history of anesthesia complication   . GERD (gastroesophageal reflux disease)   . Glaucoma    bilateral, pt. admits that she is noncompliant to eye gtts.   . Hemorrhoids   . Hyperlipidemia   . Hypertension    had stress, echo- 2006 /w [place], Cardiac Cath, per pt. 2002, echo repeated 2012- wnl   . Low back pain   . Shortness of breath   . Sleep apnea    uses c-pap- q night recently     Social History   Social History  . Marital status: Married    Spouse name: N/A  . Number of children: N/A  . Years of education: N/A   Occupational History  . retired Quarry manager Unemployed   Social History Main Topics  . Smoking status: Never Smoker  . Smokeless tobacco: Never Used  . Alcohol use No    . Drug use: No  . Sexual activity: No   Other Topics Concern  . Not on file   Social History Narrative  . No narrative on file    Past Surgical History:  Procedure Laterality Date  . ABDOMINAL HYSTERECTOMY     ectopic, fibroids  . CARDIAC CATHETERIZATION    . COLONOSCOPY     remote  . EYE SURGERY     cataracts removed bilateral- ?IOL  . FISSURECTOMY  10/08/2011   Procedure: FISSURECTOMY;  Surgeon: Stark Klein, MD;  Location: [facility];  Service: General;  Laterality: N/A;  . FLEXIBLE SIGMOIDOSCOPY  02/25/2011   Procedure: FLEXIBLE SIGMOIDOSCOPY;  Surgeon: Inda Castle, MD;  Location: [facility];  Service: Endoscopy;  Laterality: N/A;  . FOOT SURGERY     bilat, heel spurs- screw in R foot   . HEMORRHOID SURGERY  10/08/2011   Procedure: HEMORRHOIDECTOMY;  Surgeon: Stark Klein, MD;  Location: [facility];  Service: General;  Laterality: N/A;  External   . SPHINCTEROTOMY  10/08/2011   Procedure: Joan Mayans;  Surgeon: Stark Klein, MD;  Location: [facility];  Service: General;  Laterality: N/A;    Family History  Problem Relation Age of Onset  .  Cancer Mother        breast  . Heart attack Mother   . Heart disease Mother   . Cancer Other        breast, colon, lung  . Heart disease Other   . Emphysema Sister   . Cancer Sister        breast  . Asthma Sister   . Heart disease Sister   . Rheum arthritis Sister   . Cancer Brother        colon  . Anesthesia problems Neg Hx     Allergies  Allergen Reactions  . Fish Oil Anaphylaxis  . Other Hives, Shortness Of Breath and Swelling    Allergic to cashew nuts and peanut oil.  Marland Kitchen Penicillins Anaphylaxis, Hives and Swelling    Has patient had a PCN reaction causing immediate rash, facial/tongue/throat swelling, SOB or lightheadedness with hypotension: Face swelling and hives started first, then swelling of the throat  Has patient had a PCN reaction causing severe rash involving mucus membranes or skin necrosis: Yes  Has patient had  a PCN reaction that required hospitalization: No  Has patient had a PCN reaction occurring within the last 10 years: Yes  If all of the above answers are "NO", then may proceed with Cephalospor  . Pneumococcal Vaccines Nausea And Vomiting  . Aspirin Itching and Rash  . Bactrim [Sulfamethoxazole-Trimethoprim] Hives, Itching and Rash  . Ciprofloxacin Hives, Itching and Rash  . Ibuprofen Rash  . Influenza Vaccines Hives  . Ivp Dye [Iodinated Diagnostic Agents] Hives, Itching and Rash    Gives benadryl to counteract symptoms  . Latex Rash  . Macrobid [Nitrofurantoin Monohydrate Macrocrystals] Hives  . Shellfish Allergy Hives    Patient also allergic to seafood    Current Outpatient Prescriptions on File Prior to Visit  Medication Sig Dispense Refill  . albuterol (PROVENTIL HFA;VENTOLIN HFA) 108 (90 Base) MCG/ACT inhaler Inhale 2 puffs into the lungs every 6 (six) hours as needed. 1 Inhaler 0  . albuterol (PROVENTIL HFA;VENTOLIN HFA) 108 (90 Base) MCG/ACT inhaler Inhale 1-2 puffs into the lungs every 6 (six) hours as needed for wheezing or shortness of breath.    . ALPRAZolam (XANAX) 0.25 MG tablet Take 1 tablet (0.25 mg total) by mouth 3 (three) times daily as needed for anxiety or sleep. 30 tablet 5  . amLODipine (NORVASC) 10 MG tablet Take 1 tablet (10 mg total) by mouth daily. 90 tablet 1  . CONSTULOSE 10 GM/15ML solution TAKE 30 MLS BY MOUTH 3 TIMES DAILY AS NEEDED FOR MILD CONSTIPATION 473 mL 1  . cyclobenzaprine (FLEXERIL) 10 MG tablet Take 1 tablet (10 mg total) by mouth 3 (three) times daily as needed. 90 tablet 1  . escitalopram (LEXAPRO) 5 MG tablet Take 1 tablet (5 mg total) by mouth daily. 30 tablet 0  . Fluticasone-Salmeterol (ADVAIR) 250-50 MCG/DOSE AEPB Inhale 1 puff into the lungs every 12 (twelve) hours. 60 each 5  . furosemide (LASIX) 40 MG tablet Take 0.5 tablets (20 mg total) by mouth daily. 30 tablet 0  . HYDROcodone-acetaminophen (NORCO) 10-325 MG tablet Take 1 tablet by  mouth every 6 (six) hours as needed. 90 tablet 0  . Pirfenidone (ESBRIET) 801 MG TABS Take 1 tablet by mouth 3 (three) times daily. 90 tablet 11  . rivaroxaban (XARELTO) 20 MG TABS tablet Take 1 tablet (20 mg total) by mouth daily with supper. 30 tablet 5   No current facility-administered medications on file prior to visit.  BP (!) 164/72 (BP Location: [name] Arm, Patient Position: Sitting, Cuff Size: Normal)   Pulse 60   Temp 97.9 F (36.6 C) (Oral)   Wt 202 lb 6.4 oz (91.8 kg)   BMI 33.68 kg/m      Review of Systems  Constitutional: Negative.   HENT: Negative for congestion, dental problem, hearing loss, rhinorrhea, sinus pressure, sore throat and tinnitus.   Eyes: Negative for pain, discharge and visual disturbance.  Respiratory: Negative for cough and shortness of breath.   Cardiovascular: Negative for chest pain, palpitations and leg swelling.  Gastrointestinal: Negative for abdominal distention, abdominal pain, blood in stool, constipation, diarrhea, nausea and vomiting.  Genitourinary: Negative for difficulty urinating, dysuria, flank pain, frequency, hematuria, pelvic pain, urgency, vaginal bleeding, vaginal discharge and vaginal pain.  Musculoskeletal: Negative for arthralgias, gait problem and joint swelling.  Skin: Negative for rash.  Neurological: Negative for dizziness, syncope, speech difficulty, weakness, numbness and headaches.  Hematological: Negative for adenopathy.  Psychiatric/Behavioral: Negative for agitation, behavioral problems and dysphoric mood. The patient is not nervous/anxious.        Objective:   Physical Exam  Constitutional: She appears well-developed and well-nourished. No distress.  Blood pressure 170/80          Assessment & Plan:   Diabetes mellitus.  Diabetes discussed at length.  She was given considerable information concerning nonpharmacologic treatment with lifestyle.  She does have a home glucometer.  Will start metformin  extended release 1000 mg daily.  Recheck hemoglobin A1c in 3 months.  Patient will call the office if blood sugars are consistently greater than 160 Hypertension.  Poor control.  Blood pressure 170/80.  We'll continue amlodipine 10 mg daily; increase losartan 100 mg daily; increase metoprolol 25 mg twice a day  Continue home blood pressure monitoring Continue low-salt diet  Preventive care.  Patient states that she is allergic to eggs and has had a single flu vaccine in the past, which she tolerated very poorly.  Will defer flu vaccine today  Follow-up 3 months  KWIATKOWSKI,PETER Pilar Plate

## 2016-12-09 ENCOUNTER — Encounter: Payer: Self-pay | Admitting: Gastroenterology

## 2016-12-09 ENCOUNTER — Ambulatory Visit (INDEPENDENT_AMBULATORY_CARE_PROVIDER_SITE_OTHER): Payer: Medicare Other | Admitting: Gastroenterology

## 2016-12-09 ENCOUNTER — Encounter (INDEPENDENT_AMBULATORY_CARE_PROVIDER_SITE_OTHER): Payer: Self-pay

## 2016-12-09 VITALS — BP 136/64 | HR 60 | Ht 64.25 in | Wt 201.5 lb

## 2016-12-09 DIAGNOSIS — G4733 Obstructive sleep apnea (adult) (pediatric): Secondary | ICD-10-CM

## 2016-12-09 DIAGNOSIS — J849 Interstitial pulmonary disease, unspecified: Secondary | ICD-10-CM

## 2016-12-09 DIAGNOSIS — Z7901 Long term (current) use of anticoagulants: Secondary | ICD-10-CM

## 2016-12-09 DIAGNOSIS — Z86010 Personal history of colonic polyps: Secondary | ICD-10-CM | POA: Diagnosis not present

## 2016-12-09 NOTE — Patient Instructions (Signed)
If you are age 70 or older, your body mass index should be between 23-30. Your Body mass index is 34.32 kg/m. If this is out of the aforementioned range listed, please consider follow up with your Primary Care Provider.  If you are age 16 or younger, your body mass index should be between 19-25. Your Body mass index is 34.32 kg/m. If this is out of the aformentioned range listed, please consider follow up with your Primary Care Provider.   You will be due for a recall colonoscopy in 05-2017. We will send you a reminder in the mail when it gets closer to that time.  Thank you for choosing [facility]  Dr Wilfrid Lund III

## 2016-12-09 NOTE — Progress Notes (Signed)
IPF PRO Registry Purpose: To collect data and biological samples that will support future research studies.  Registry will describe current approaches to diagnosis and treatment of IPF, analyze participant characteristics to describe the natural history of the disease, assess quality of life, describe participants interactions with the health care system, describe IPF treatment practices across multiple institutions, and utilize biological samples linked to well characterized IPF participants to identify disease biomarkers.   Clinical Research Coordinator / Research RN note : This visit for Subject Miranda [name] with DOB: [id number] on 12/07/2016 for the above protocol is Visit/Encounter consenting/enrollment and is for purpose of research . The consent for this encounter is currently IRB approved.  Subject expressed continued interest and consent in continuing as a study subject. Subject confirmed that there was no change in contact information (e.g. address, telephone, email). Subject thanked for participation in research and contribution to science.   In this visit 12/07/2016 the subject spoke with the investigator named Dr. [name] . This research coordinator has verified that the investigator is uptodate with his training logs.  All questionnaires, blood work and other scheduled activities were done/drawn without any problems. Patient denied any other difficulties at this time aside from her baseline shortness of breath at times and cough.  This CRC has confirmed that the note will be routed to PI.  Signed by  [name] Clinical Research Coordinator / Nurse [name], [place] 8:59 AM 12/09/2016

## 2016-12-09 NOTE — Progress Notes (Signed)
[facility] Consult Note:  History: Miranda [name] 12/09/2016  Referring physician: Marletta Lor, MD  Reason for consult/chief complaint: hx of colon polyps (recall colon) and Constipation (now having some diarrhea since starting metformin)   Subjective  HPI:  This is a 71 year old woman seen for history of colon polyps. She was brought for an office visit due to her medical conditions and anticoagulation therapy. Lavren underwent her last colonoscopy with Dr. Deatra Ina in February 2012, at which time 5 adenomatous polyps were removed. She has tended toward constipation for many years, but started having loose and more frequent stools since starting metformin for newly diagnosed IBD's 2 days ago. She denies rectal bleeding or abdominal pain. Her appetite is been good and she has had some recent purposeful weight loss. She denies dysphagia or odynophagia or vomiting. I reviewed her most recent primary care and pulmonary notes from 2 days ago. She was diagnosed with a pulmonary embolism in February 2018 and is currently on Xarelto. It is not clear to me from the notes how long she will be on this therapy. He seems to get the impression from Dr. [name] that it might be a year.  ROS:  Review of Systems  Constitutional: Negative for appetite change and unexpected weight change.  HENT: Negative for mouth sores and voice change.   Eyes: Negative for pain and redness.  Respiratory: Positive for shortness of breath. Negative for cough.   Cardiovascular: Negative for chest pain and palpitations.  Genitourinary: Negative for dysuria and hematuria.  Musculoskeletal: Negative for arthralgias and myalgias.  Skin: Negative for pallor and rash.  Neurological: Negative for weakness and headaches.  Hematological: Negative for adenopathy.   She has chronic fatigue that has been attributed to her sleep apnea. She has dyspnea with significant exertion.  Past Medical  History: Past Medical History:  Diagnosis Date  . Anxiety   . Arthritis   . Asthma   . CHF (congestive heart failure) ([place])    pt. unsure- but thinks she was hosp. for CHF- 2002  . Chronic kidney disease    [facility]  . Colon polyps   . Complication of anesthesia    states requires a lot med. to put her to sleep   . DDD (degenerative disc disease) 09/17/2011  . Depression    "sometimes "  . DM (diabetes mellitus) ([place])   . Family history of anesthesia complication   . GERD (gastroesophageal reflux disease)   . Glaucoma    bilateral, pt. admits that she is noncompliant to eye gtts.   . Hemorrhoids   . Hyperlipidemia   . Hypertension    had stress, echo- 2006 /w [place], Cardiac Cath, per pt. 2002, echo repeated 2012- wnl   . Low back pain   . Shortness of breath   . Sleep apnea    uses c-pap- q night recently   I reviewed Dr. Anastasia Pall pulmonary clinic note from 2 days ago.  Past Surgical History: Past Surgical History:  Procedure Laterality Date  . ABDOMINAL HYSTERECTOMY     ectopic, fibroids  . CARDIAC CATHETERIZATION    . CATARACT EXTRACTION, BILATERAL     cataracts removed bilateral- ?IOL  . COLONOSCOPY     remote  . FISSURECTOMY  10/08/2011   Procedure: FISSURECTOMY;  Surgeon: Stark Klein, MD;  Location: [facility];  Service: General;  Laterality: N/A;  . FLEXIBLE SIGMOIDOSCOPY  02/25/2011   Procedure: FLEXIBLE SIGMOIDOSCOPY;  Surgeon: Inda Castle, MD;  Location:  [facility];  Service: Endoscopy;  Laterality: N/A;  . FOOT SURGERY     bilat, heel spurs- screw in R foot   . HEMORRHOID SURGERY  10/08/2011   Procedure: HEMORRHOIDECTOMY;  Surgeon: Stark Klein, MD;  Location: [facility];  Service: General;  Laterality: N/A;  External   . SPHINCTEROTOMY  10/08/2011   Procedure: Joan Mayans;  Surgeon: Stark Klein, MD;  Location: [facility];  Service: General;  Laterality: N/A;     Family History: Family History  Problem Relation Age of Onset  . Heart attack  Mother   . Heart disease Mother   . Breast cancer Mother   . Emphysema Sister   . Breast cancer Sister   . Asthma Sister   . Lung cancer Sister   . COPD Sister   . Heart disease Sister   . Rheum arthritis Sister   . Colon cancer Brother   . Lung cancer Sister   . Anesthesia problems Neg Hx     Social History: Social History   Social History  . Marital status: Married    Spouse name: N/A  . Number of children: 4  . Years of education: N/A   Occupational History  . retired Quarry manager Unemployed   Social History Main Topics  . Smoking status: Never Smoker  . Smokeless tobacco: Never Used  . Alcohol use No  . Drug use: No  . Sexual activity: No   Other Topics Concern  . None   Social History Narrative  . None    Allergies: Allergies  Allergen Reactions  . Fish Oil Anaphylaxis  . Other Hives, Shortness Of Breath and Swelling    Allergic to cashew nuts and peanut oil.  Marland Kitchen Penicillins Anaphylaxis, Hives and Swelling    Has patient had a PCN reaction causing immediate rash, facial/tongue/throat swelling, SOB or lightheadedness with hypotension: Face swelling and hives started first, then swelling of the throat  Has patient had a PCN reaction causing severe rash involving mucus membranes or skin necrosis: Yes  Has patient had a PCN reaction that required hospitalization: No  Has patient had a PCN reaction occurring within the last 10 years: Yes  If all of the above answers are "NO", then may proceed with Cephalospor  . Pneumococcal Vaccines Nausea And Vomiting  . Aspirin Itching and Rash  . Bactrim [Sulfamethoxazole-Trimethoprim] Hives, Itching and Rash  . Ciprofloxacin Hives, Itching and Rash  . Ibuprofen Rash  . Influenza Vaccines Hives  . Ivp Dye [Iodinated Diagnostic Agents] Hives, Itching and Rash    Gives benadryl to counteract symptoms  . Latex Rash  . Macrobid [Nitrofurantoin Monohydrate Macrocrystals] Hives  . Shellfish Allergy Hives    Patient also allergic to  seafood    Outpatient Meds: Current Outpatient Prescriptions  Medication Sig Dispense Refill  . albuterol (PROVENTIL HFA;VENTOLIN HFA) 108 (90 Base) MCG/ACT inhaler Inhale 1-2 puffs into the lungs every 6 (six) hours as needed for wheezing or shortness of breath.    . ALPRAZolam (XANAX) 0.25 MG tablet Take 1 tablet (0.25 mg total) by mouth 3 (three) times daily as needed for anxiety or sleep. 30 tablet 5  . amLODipine (NORVASC) 10 MG tablet Take 1 tablet (10 mg total) by mouth daily. 90 tablet 1  . CONSTULOSE 10 GM/15ML solution TAKE 30 MLS BY MOUTH 3 TIMES DAILY AS NEEDED FOR MILD CONSTIPATION 473 mL 1  . cyclobenzaprine (FLEXERIL) 10 MG tablet Take 1 tablet (10 mg total) by mouth 3 (three) times daily as needed. [place]  tablet 1  . escitalopram (LEXAPRO) 5 MG tablet Take 1 tablet (5 mg total) by mouth daily. 30 tablet 0  . Fluticasone-Salmeterol (ADVAIR) 250-50 MCG/DOSE AEPB Inhale 1 puff into the lungs every 12 (twelve) hours. 60 each 5  . furosemide (LASIX) 40 MG tablet Take 0.5 tablets (20 mg total) by mouth daily. 30 tablet 0  . HYDROcodone-acetaminophen (NORCO) 10-325 MG tablet Take 1 tablet by mouth every 6 (six) hours as needed. 90 tablet 0  . losartan (COZAAR) 100 MG tablet Take 1 tablet (100 mg total) by mouth daily. 90 tablet 3  . lovastatin (MEVACOR) 40 MG tablet Take 1 tablet (40 mg total) by mouth at bedtime. 90 tablet 4  . metFORMIN (GLUCOPHAGE-XR) 500 MG 24 hr tablet Take 2 tablets (1,000 mg total) by mouth daily with breakfast. 180 tablet 3  . metoprolol tartrate (LOPRESSOR) 25 MG tablet Take 1 tablet (25 mg total) by mouth 2 (two) times daily. 180 tablet 6  . Pirfenidone (ESBRIET) 801 MG TABS Take 1 tablet by mouth 3 (three) times daily. 90 tablet 11  . rivaroxaban (XARELTO) 20 MG TABS tablet Take 1 tablet (20 mg total) by mouth daily with supper. 30 tablet 5   No current facility-administered medications for this visit.        ___________________________________________________________________ Objective   Exam:  BP 136/64 (BP Location: Left Arm, Patient Position: Sitting, Cuff Size: Normal)   Pulse 60   Ht 5' 4.25" (1.632 m) Comment: height measured without shoes  Wt 201 lb 8 oz (91.4 kg)   BMI 34.32 kg/m    General: this is a(n) Well-appearing woman, breathing comfortably on room air and ambulate down the hall without dyspnea before and after the encounter.   Eyes: sclera anicteric, no redness  ENT: oral mucosa moist without lesions, no cervical or supraclavicular lymphadenopathy, good dentition  CV: RRR without murmur, S1/S2, no JVD, no peripheral edema  Resp: clear to auscultation bilaterally, normal RR and effort noted  GI: soft, overweight, no tenderness, with active bowel sounds. No guarding or palpable organomegaly noted.  Skin; warm and dry, no rash or jaundice noted  Neuro: awake, alert and oriented x 3. Normal gross motor function and fluent speech Her daughter-in-law was present for the entire encounter.  Labs:  5 colon polyps removed in February 2012. All submitted pieces of tissue reported as tubular adenoma  Radiologic Studies:  CT angiogram of the chest from February 2018 reported. Pulmonary embolism and fibrosis seen.  Assessment: Encounter Diagnoses  Name Primary?  . History of colonic polyps Yes  . ILD (interstitial lung disease) ([place])   . OSA (obstructive sleep apnea)   . Current use of long term anticoagulation     She has stable interstitial lung disease with good respiratory status and does not require supplement oxygen.  Regarding her anticoagulation, we discussed how this would definitely need to be held 2 days prior and perhaps for a day or 2 after the procedure if polyps are removed. Even still, there is an increased risk of post-polypectomy bleed. Since this is a routine colonoscopy for polyp surveillance, I would prefer to do it after she is able to  stop anticoagulation altogether, if that is the ultimate plan.  Plan:  We will recall her in 6 months I will send a copy of my office note to her primary care physician today to address this question.  Total 30 minute time, over half spent in chart review and counseling patient.   Estill Cotta  Danis III  CC: Marletta Lor, MD

## 2016-12-22 ENCOUNTER — Telehealth: Payer: Self-pay | Admitting: Pulmonary Disease

## 2016-12-22 NOTE — Telephone Encounter (Signed)
I called and spoke with Miranda [name] with [facility] he stated that the patient needed a sleep study. While we were checking the patients chart we saw that she had split night sleep study on [id number] and [facility] was not aware the sleep study has been done. I called Miranda [name] and explained why she hasn't heard anything yet. I told her per Miranda [name] that he was printing everything to give to Barbaraann Rondo and they would put a rush on this CPAP order

## 2016-12-23 DIAGNOSIS — H40023 Open angle with borderline findings, high risk, bilateral: Secondary | ICD-10-CM | POA: Diagnosis not present

## 2016-12-23 DIAGNOSIS — Z961 Presence of intraocular lens: Secondary | ICD-10-CM | POA: Diagnosis not present

## 2016-12-23 DIAGNOSIS — E119 Type 2 diabetes mellitus without complications: Secondary | ICD-10-CM | POA: Diagnosis not present

## 2016-12-23 DIAGNOSIS — H52203 Unspecified astigmatism, bilateral: Secondary | ICD-10-CM | POA: Diagnosis not present

## 2016-12-23 DIAGNOSIS — H524 Presbyopia: Secondary | ICD-10-CM | POA: Diagnosis not present

## 2016-12-24 ENCOUNTER — Telehealth: Payer: Self-pay | Admitting: Internal Medicine

## 2016-12-24 NOTE — Telephone Encounter (Signed)
Pt states she will make a pain management appt.

## 2016-12-24 NOTE — Telephone Encounter (Signed)
The below letter has been mailed to pt's home address.   "Dear patients, The Strengthen Opioid Misuse Prevention (STOP) Act of 2017 has been signed into law to fight the opioid problem that has had a major impact in [place] and the [place].  [facility] wants to be sure to follow the law while we continue to provide you with the exceptional care you have come to expect.   For most of you, nothing will change.  Those of you who get a regular long-term pain management prescription from one of our providers will need to schedule a separate pain management appointment.  At this appointment, your provider will talk you through the changes we have had to begin because of the STOP Act.  It's the law.  What this means for you is that now you will need to have a separate pain management visit with your provider at least every 3 months.  You will also need to sign an updated controlled substance contract that will be discussed with you in detail during your visit.   We know this change can be confusing and uncomfortable, but we are here to help you every step of the way.  Please contact us today to set up your pain management appointment.    Sincerely,   [facility] ""

## 2016-12-24 NOTE — Telephone Encounter (Signed)
Pt need new Rx for Hydrocodone   Pt is aware of 3 business days for refills and someone will call when ready for pick up. °

## 2016-12-24 NOTE — Telephone Encounter (Signed)
Pt has been scheduled for a pain management appointment

## 2016-12-28 ENCOUNTER — Encounter: Payer: Self-pay | Admitting: Internal Medicine

## 2016-12-28 ENCOUNTER — Ambulatory Visit (INDEPENDENT_AMBULATORY_CARE_PROVIDER_SITE_OTHER): Payer: Medicare Other | Admitting: Internal Medicine

## 2016-12-28 VITALS — BP 188/90 | HR 60 | Temp 98.0°F | Ht 64.25 in | Wt 201.4 lb

## 2016-12-28 DIAGNOSIS — M545 Low back pain, unspecified: Secondary | ICD-10-CM

## 2016-12-28 DIAGNOSIS — G8929 Other chronic pain: Secondary | ICD-10-CM | POA: Diagnosis not present

## 2016-12-28 DIAGNOSIS — Z79899 Other long term (current) drug therapy: Secondary | ICD-10-CM | POA: Diagnosis not present

## 2016-12-28 MED ORDER — HYDROCODONE-ACETAMINOPHEN 10-325 MG PO TABS: 1.0000 | ORAL_TABLET | Freq: Four times a day (QID) | ORAL | 0 refills | Status: DC | PRN

## 2016-12-28 NOTE — Patient Instructions (Signed)
Return as scheduled for general follow-up 

## 2016-12-28 NOTE — Progress Notes (Signed)
   Subjective:    Patient ID: Miranda [name], female    DOB: June 15, 1945, 71 y.o.   MRN: [id number]  HPI  71 year old patient who is seen today for pain management evaluation per opioid protocol  Cut Off printed, initialed  and scanned into the EMR.  Opioid pain contract reviewed and signed with patient  Indication for chronic opioid: patient has degenerative joint disease and chronic low back pain. Medication and dose:hydrocodone 5 mg-acetaminophen 325 mg # pills per month:60 Last UDS date: 12/28/16 Pain contract signed (Y/N): yes Date narcotic database last reviewed (include red flags): 12/28/16  Review of Systems     Objective:   Physical Exam        Assessment & Plan:    Encounter for chronic pain management (G89.29) Narcotic use  (711.90) Pain management contract signed (Z02.89)  Nyoka Cowden

## 2017-01-01 ENCOUNTER — Telehealth: Payer: Self-pay | Admitting: Pulmonary Disease

## 2017-01-01 LAB — PAIN MGMT, PROFILE 8 W/CONF, U
6 Acetylmorphine: NEGATIVE ng/mL (ref <10)
ALCOHOL METABOLITES: NEGATIVE ng/mL (ref <500)
ALPHAHYDROXYALPRAZOLAM: NEGATIVE ng/mL (ref <25)
ALPHAHYDROXYMIDAZOLAM: NEGATIVE ng/mL (ref <50)
Alphahydroxytriazolam: NEGATIVE ng/mL (ref <50)
Aminoclonazepam: NEGATIVE ng/mL (ref <25)
Amphetamines: NEGATIVE ng/mL (ref <500)
BENZODIAZEPINES: NEGATIVE ng/mL (ref <100)
BUPRENORPHINE, URINE: NEGATIVE ng/mL (ref <5)
COCAINE METABOLITE: NEGATIVE ng/mL (ref <150)
CREATININE: 43.1 mg/dL
HYDROXYETHYLFLURAZEPAM: NEGATIVE ng/mL (ref <50)
LORAZEPAM: NEGATIVE ng/mL (ref <50)
MARIJUANA METABOLITE: NEGATIVE ng/mL (ref <20)
MDMA: NEGATIVE ng/mL (ref <500)
NORDIAZEPAM: NEGATIVE ng/mL (ref <50)
OPIATES: NEGATIVE ng/mL (ref <100)
Oxazepam: NEGATIVE ng/mL (ref <50)
Oxidant: NEGATIVE ug/mL (ref <200)
Oxycodone: NEGATIVE ng/mL (ref <100)
PH: 6.99 (ref 4.5–9.0)
TEMAZEPAM: NEGATIVE ng/mL (ref <50)

## 2017-01-04 NOTE — Telephone Encounter (Signed)
Called [place] at [facility], he states [facility] has been trying to contact pt about CPAP setup with no relief. I called pt to let her know to contact [facility] for CPAP setup. Pt understood and nothing further is needed.

## 2017-01-06 ENCOUNTER — Other Ambulatory Visit: Payer: Self-pay | Admitting: Internal Medicine

## 2017-03-01 ENCOUNTER — Ambulatory Visit (INDEPENDENT_AMBULATORY_CARE_PROVIDER_SITE_OTHER): Payer: Medicare Other | Admitting: Family Medicine

## 2017-03-01 ENCOUNTER — Ambulatory Visit (HOSPITAL_COMMUNITY)
Admission: RE | Admit: 2017-03-01 | Discharge: 2017-03-01 | Disposition: A | Discharge status: Home / Self Care | Payer: Medicare Other | Source: Ambulatory Visit | Attending: Family Medicine | Admitting: Family Medicine

## 2017-03-01 ENCOUNTER — Ambulatory Visit: Payer: Medicare Other | Admitting: Internal Medicine

## 2017-03-01 ENCOUNTER — Ambulatory Visit: Payer: Self-pay

## 2017-03-01 ENCOUNTER — Encounter: Payer: Self-pay | Admitting: Pulmonary Disease

## 2017-03-01 ENCOUNTER — Encounter: Payer: Self-pay | Admitting: Family Medicine

## 2017-03-01 VITALS — BP 140/80 | HR 65 | Temp 98.3°F | Resp 16 | Ht 64.25 in | Wt 195.5 lb

## 2017-03-01 DIAGNOSIS — J9811 Atelectasis: Secondary | ICD-10-CM | POA: Diagnosis not present

## 2017-03-01 DIAGNOSIS — R0602 Shortness of breath: Secondary | ICD-10-CM | POA: Diagnosis not present

## 2017-03-01 DIAGNOSIS — J988 Other specified respiratory disorders: Secondary | ICD-10-CM | POA: Diagnosis not present

## 2017-03-01 DIAGNOSIS — R079 Chest pain, unspecified: Secondary | ICD-10-CM | POA: Diagnosis not present

## 2017-03-01 DIAGNOSIS — J45901 Unspecified asthma with (acute) exacerbation: Secondary | ICD-10-CM

## 2017-03-01 DIAGNOSIS — J8410 Pulmonary fibrosis, unspecified: Secondary | ICD-10-CM | POA: Insufficient documentation

## 2017-03-01 DIAGNOSIS — R05 Cough: Secondary | ICD-10-CM | POA: Diagnosis not present

## 2017-03-01 MED ORDER — DOXYCYCLINE HYCLATE 100 MG PO TABS: 100.0000 mg | ORAL_TABLET | Freq: Two times a day (BID) | ORAL | 0 refills | Status: AC

## 2017-03-01 MED ORDER — PREDNISONE 20 MG PO TABS: 40.0000 mg | ORAL_TABLET | Freq: Every day | ORAL | 0 refills | Status: AC

## 2017-03-01 NOTE — Patient Instructions (Addendum)
  Miranda [name] I have seen you today for an acute visit.  A few things to remember from today's visit:   Moderate asthma with exacerbation, unspecified whether persistent - Plan: DG Chest 2 View, CBC with Differential/Platelet  Respiratory tract infection - Plan: CBC with Differential/Platelet  Chest pain, unspecified type   Medications prescribed today are intended for short period of time and will not be refill upon request, a follow up appointment might be necessary to discuss continuation of of treatment if appropriate.   Albuterol inh 2 puff every 6 hours for a week then as needed for wheezing or shortness of breath.  Prednisone to start tomorrow. PLAIN Mucinex.  Today X ray was ordered.  This can be done at [facility] at [facility] between 8 am and 5 pm: [place]. [phone].    In general please monitor for signs of worsening symptoms and seek immediate medical attention if any concerning.    I hope you get better soon!

## 2017-03-01 NOTE — Telephone Encounter (Signed)
Noted  

## 2017-03-01 NOTE — Telephone Encounter (Signed)
Pt called with c/o cough since last Thursday. She states she is having wheezing and mild shortness of breath. Pt having sweating  Along with cold chills. PT states she has tried Robitussin DM and it has not helped. Pt states cough is productive with yellow to green color. Pt states she has a h/o pulmonary embolism x 2 last year. Recommended to pt urgent care but appt made for 2:30 with Dr [name]. Pt advised to go to [facility] if worsens.   Reason for Disposition . Wheezing is present  Answer Assessment - Initial Assessment Questions 1. ONSET: "When did the cough begin?"      Last Thursday 2. SEVERITY: "How bad is the cough today?"      Coughing is preventing pt form sleeping  3. RESPIRATORY DISTRESS: "Describe your breathing."      Shortness of breath at rest , wheezing  4. FEVER: "Do you have a fever?" If so, ask: "What is your temperature, how was it measured, and when did it start?"     No documented fever. Pt is having sweating  and cold chills 5. SPUTUM: "Describe the color of your sputum" (clear, white, yellow, green)     Yellow to green color 6. HEMOPTYSIS: "Are you coughing up any blood?" If so ask: "How much?" (flecks, streaks, tablespoons, etc.)     Pt states last Thursday and Friday was coughing up streaks of blood. Not coughing up any now.  7. CARDIAC HISTORY: "Do you have any history of heart disease?" (e.g., heart attack, congestive heart failure)      H/o CHF 8. LUNG HISTORY: "Do you have any history of lung disease?"  (e.g., pulmonary embolus, asthma, emphysema)     Was dx last year with 2 blood clots in lung is still taking blood thinner 9. PE RISK FACTORS: "Do you have a history of blood clots?" (or: recent major surgery, recent prolonged travel, bedridden )     Had 2 blood clots last year 10. OTHER SYMPTOMS: "Do you have any other symptoms?" (e.g., runny nose, wheezing, chest pain)       Runny nose wheezing and chest pain. Chest pain intermittent middle of chest with  coughing 11. PREGNANCY: "Is there any chance you are pregnant?" "When was your last menstrual period?"       n/a 12. TRAVEL: "Have you traveled out of the country in the last month?" (e.g., travel history, exposures)       no  Protocols used: [place]

## 2017-03-01 NOTE — Progress Notes (Signed)
ACUTE VISIT  HPI:  Chief Complaint  Patient presents with  . Cough    started last Thursday  . Wheezing    [name] is a 71 y.o.female here today complaining of 5 days of respiratory symptoms.  Last time she had fever was yesterday, 100 F.  Cough  This is a new problem. The current episode started in the past 7 days. The problem has been gradually worsening. The problem occurs constantly. The cough is productive of sputum. Associated symptoms include chest pain, a fever, nasal congestion, postnasal drip, rhinorrhea, shortness of breath and wheezing. Pertinent negatives include no chills, ear congestion, ear pain, eye redness, headaches, heartburn, hemoptysis, myalgias, rash, sore throat or sweats. The symptoms are aggravated by exercise and lying down. She has tried a beta-agonist inhaler and OTC cough suppressant for the symptoms. The treatment provided mild relief. Her past medical history is significant for asthma and environmental allergies. There is no history of COPD.     Upper mid chest pain, constant,exacerbated by cough and deep breathing. Alleviated some by shallow breathing. Pain is not radiated.    No Hx of recent travel. No sick contact. No known insect bite.  Hx of allergies: Asthma. She used Albuterol inh 3 hours ago.  Interstitial lung disease.  OTC medications for this problem: OTC cough syrup.  Hx of DM II, her BS's "good."   Review of Systems  Constitutional: Positive for activity change, appetite change, fatigue and fever. Negative for chills.  HENT: Positive for congestion, postnasal drip and rhinorrhea. Negative for ear pain, facial swelling, mouth sores, sinus pressure, sore throat, trouble swallowing and voice change.   Eyes: Negative for discharge, redness and itching.  Respiratory: Positive for cough, shortness of breath and wheezing. Negative for hemoptysis.   Cardiovascular: Positive for chest pain. Negative for palpitations  and leg swelling.  Gastrointestinal: Negative for abdominal pain, diarrhea, heartburn, nausea and vomiting.  Genitourinary: Negative for decreased urine volume and hematuria.  Musculoskeletal: Negative for gait problem and myalgias.  Skin: Negative for rash.  Allergic/Immunologic: Positive for environmental allergies.  Neurological: Negative for syncope, weakness and headaches.  Hematological: Negative for adenopathy.  Psychiatric/Behavioral: Positive for sleep disturbance. Negative for confusion. The patient is nervous/anxious.       Current Outpatient Medications on File Prior to Visit  Medication Sig Dispense Refill  . albuterol (PROVENTIL HFA;VENTOLIN HFA) 108 (90 Base) MCG/ACT inhaler Inhale 1-2 puffs into the lungs every 6 (six) hours as needed for wheezing or shortness of breath.    . ALPRAZolam (XANAX) 0.25 MG tablet Take 1 tablet (0.25 mg total) by mouth 3 (three) times daily as needed for anxiety or sleep. 30 tablet 5  . amLODipine (NORVASC) 10 MG tablet Take 1 tablet (10 mg total) by mouth daily. 90 tablet 1  . CONSTULOSE 10 GM/15ML solution TAKE 30 MLS BY MOUTH 3 TIMES DAILY AS NEEDED FOR MILD CONSTIPATION 473 mL 1  . cyclobenzaprine (FLEXERIL) 10 MG tablet Take 1 tablet (10 mg total) by mouth 3 (three) times daily as needed. 90 tablet 1  . escitalopram (LEXAPRO) 5 MG tablet Take 1 tablet (5 mg total) by mouth daily. 30 tablet 0  . Fluticasone-Salmeterol (ADVAIR) 250-50 MCG/DOSE AEPB Inhale 1 puff into the lungs every 12 (twelve) hours. 60 each 5  . furosemide (LASIX) 40 MG tablet Take 0.5 tablets (20 mg total) by mouth daily. 30 tablet 0  . furosemide (LASIX) 40 MG tablet TAKE 1/2 (ONE-HALF) TABLET  BY MOUTH ONCE DAILY 30 tablet 0  . HYDROcodone-acetaminophen (NORCO) 10-325 MG tablet Take 1 tablet by mouth every 6 (six) hours as needed. 90 tablet 0  . losartan (COZAAR) 100 MG tablet Take 1 tablet (100 mg total) by mouth daily. 90 tablet 3  . lovastatin (MEVACOR) 40 MG tablet  Take 1 tablet (40 mg total) by mouth at bedtime. 90 tablet 4  . metFORMIN (GLUCOPHAGE-XR) 500 MG 24 hr tablet Take 2 tablets (1,000 mg total) by mouth daily with breakfast. 180 tablet 3  . metoprolol tartrate (LOPRESSOR) 25 MG tablet Take 1 tablet (25 mg total) by mouth 2 (two) times daily. 180 tablet 6  . Pirfenidone (ESBRIET) 801 MG TABS Take 1 tablet by mouth 3 (three) times daily. 90 tablet 11  . rivaroxaban (XARELTO) 20 MG TABS tablet Take 1 tablet (20 mg total) by mouth daily with supper. 30 tablet 5   No current facility-administered medications on file prior to visit.      Past Medical History:  Diagnosis Date  . Anxiety   . Arthritis   . Asthma   . CHF (congestive heart failure) ([place])    pt. unsure- but thinks she was hosp. for CHF- 2002  . [facility]    [facility]  . Colon polyps   . Complication of anesthesia    states requires a lot med. to put her to sleep   . DDD (degenerative disc disease) 09/17/2011  . Depression    "sometimes "  . DM (diabetes mellitus) ([place])   . Family history of anesthesia complication   . GERD (gastroesophageal reflux disease)   . Glaucoma    bilateral, pt. admits that she is noncompliant to eye gtts.   . Hemorrhoids   . Hyperlipidemia   . Hypertension    had stress, echo- 2006 /w [place], Cardiac Cath, per pt. 2002, echo repeated 2012- wnl   . Low back pain   . Shortness of breath   . Sleep apnea    uses c-pap- q night recently   Allergies  Allergen Reactions  . Fish Oil Anaphylaxis  . Other Hives, Shortness Of Breath and Swelling    Allergic to cashew nuts and peanut oil.  Marland Kitchen Penicillins Anaphylaxis, Hives and Swelling    Has patient had a PCN reaction causing immediate rash, facial/tongue/throat swelling, SOB or lightheadedness with hypotension: Face swelling and hives started first, then swelling of the throat  Has patient had a PCN reaction causing severe rash involving mucus membranes or skin necrosis: Yes    Has patient had a PCN reaction that required hospitalization: No  Has patient had a PCN reaction occurring within the last 10 years: Yes  If all of the above answers are "NO", then may proceed with Cephalospor  . Pneumococcal Vaccines Nausea And Vomiting  . Aspirin Itching and Rash  . Bactrim [Sulfamethoxazole-Trimethoprim] Hives, Itching and Rash  . Ciprofloxacin Hives, Itching and Rash  . Ibuprofen Rash  . Influenza Vaccines Hives  . Ivp Dye [Iodinated Diagnostic Agents] Hives, Itching and Rash    Gives benadryl to counteract symptoms  . Latex Rash  . Macrobid [Nitrofurantoin Monohydrate Macrocrystals] Hives  . Shellfish Allergy Hives    Patient also allergic to seafood    Social History   Socioeconomic History  . Marital status: Married    Spouse name: None  . Number of children: 4  . Years of education: None  . Highest education level: None  Social Needs  .  Financial resource strain: None  . Food insecurity - worry: None  . Food insecurity - inability: None  . Transportation needs - medical: None  . Transportation needs - non-medical: None  Occupational History  . Occupation: retired Forensic psychologist: UNEMPLOYED  Tobacco Use  . Smoking status: Never Smoker  . Smokeless tobacco: Never Used  Substance and Sexual Activity  . Alcohol use: No    Alcohol/week: 0.0 oz  . Drug use: No  . Sexual activity: No  Other Topics Concern  . None  Social History Narrative  . None    Vitals:   03/01/17 1417  BP: 140/80  Pulse: 65  Resp: 16  Temp: 98.3 F (36.8 C)  SpO2: 98%   Body mass index is 33.3 kg/m.   Physical Exam  Nursing note and vitals reviewed. Constitutional: She is oriented to person, place, and time. She appears well-developed. She does not appear ill. No distress.  HENT:  Head: Normocephalic and atraumatic.  Nose: Right sinus exhibits no maxillary sinus tenderness and no frontal sinus tenderness. Left sinus exhibits no maxillary sinus tenderness and  no frontal sinus tenderness.  Mouth/Throat: Oropharynx is clear and moist and mucous membranes are normal.  Hypertrophic turbinates.  Eyes: Conjunctivae are normal.  Neck: No JVD present. No muscular tenderness present. No edema and no erythema present.  Cardiovascular: Normal rate and regular rhythm.  No murmur heard. Respiratory: Effort normal. No stridor. No respiratory distress. She has wheezes. She exhibits tenderness (bilateral costochondral joints).  Chest pain aggravated by deep breathing and with coughing spells during visit.  GI: Soft. She exhibits no mass. There is no tenderness.  Musculoskeletal: She exhibits no edema or tenderness.  Lymphadenopathy:    She has no cervical adenopathy.  Neurological: She is alert and oriented to person, place, and time. She has normal strength. Gait normal.  Skin: Skin is warm. No rash noted. No erythema.  Psychiatric: Her speech is normal. Her mood appears anxious.  Fairly groomed, good eye contact.     ASSESSMENT AND PLAN:   Ms. Gerica was seen today for cough and wheezing.  Diagnoses and all orders for this visit:  Moderate asthma with exacerbation, unspecified whether persistent  After verbal consent she received Duoneb neb treatment, wheezing improved, no rales, mild rhonchi. Prednisone side effects discussed, recommend starting taking it tomorrow with breakfast.Monitor BS closely. Albuterol inh 2 puff every 6 hours for a week then as needed for wheezing or shortness of breath.  Instructed about warning signs. F/U in 10 days.  -     DG Chest 2 View; Future -     CBC with Differential/Platelet -     predniSONE (DELTASONE) 20 MG tablet; Take 2 tablets (40 mg total) by mouth daily with breakfast for 5 days.  Respiratory tract infection  Most likely viral but because worsening symptoms,Hx of DM II, and asthma abx treatment recommended. Some side effects discussed. Plain Mucinex may help. Further recommendations will be given  according CXR and labs. F/U with PCP in 1-2 weeks.  -     CBC with Differential/Platelet -     doxycycline (VIBRA-TABS) 100 MG tablet; Take 1 tablet (100 mg total) by mouth 2 (two) times daily for 7 days. With food  Chest pain, unspecified type  It seems musculoskeletal. Avoid shallow breathing. Instructed about warning signs.    -Ms. [name] was advised to seek attention immediately if symptoms worsen.       Betty G. [name],  MD  [facility]. [place] office.

## 2017-03-02 ENCOUNTER — Ambulatory Visit: Payer: Medicare Other | Admitting: Internal Medicine

## 2017-03-02 LAB — CBC WITH DIFFERENTIAL/PLATELET
BASOS ABS: 0.1 10*3/uL (ref 0.0–0.1)
Basophils Relative: 1.2 % (ref 0.0–3.0)
EOS ABS: 0.5 10*3/uL (ref 0.0–0.7)
Eosinophils Relative: 7.5 % — ABNORMAL HIGH (ref 0.0–5.0)
HEMATOCRIT: 41.8 % (ref 36.0–46.0)
Hemoglobin: 13.5 g/dL (ref 12.0–15.0)
LYMPHS PCT: 47.5 % — AB (ref 12.0–46.0)
Lymphs Abs: 3.4 10*3/uL (ref 0.7–4.0)
MCHC: 32.4 g/dL (ref 30.0–36.0)
MCV: 86.8 fl (ref 78.0–100.0)
MONOS PCT: 17.2 % — AB (ref 3.0–12.0)
Monocytes Absolute: 1.2 10*3/uL — ABNORMAL HIGH (ref 0.1–1.0)
NEUTROS ABS: 1.9 10*3/uL (ref 1.4–7.7)
NEUTROS PCT: 26.6 % — AB (ref 43.0–77.0)
PLATELETS: 211 10*3/uL (ref 150.0–400.0)
RBC: 4.81 Mil/uL (ref 3.87–5.11)
RDW: 14.2 % (ref 11.5–15.5)
WBC: 7.1 10*3/uL (ref 4.0–10.5)

## 2017-03-03 NOTE — Progress Notes (Signed)
Unable to leave message on home and other numbers listed in the chart, both mailboxes are full, will try again at a later date.

## 2017-03-10 ENCOUNTER — Ambulatory Visit: Payer: Medicare Other | Admitting: Internal Medicine

## 2017-03-17 ENCOUNTER — Encounter: Payer: Self-pay | Admitting: *Deleted

## 2017-03-17 NOTE — Progress Notes (Signed)
Letter sent to patient informing of CXR and lab results.

## 2017-03-18 ENCOUNTER — Other Ambulatory Visit: Payer: Self-pay | Admitting: Internal Medicine

## 2017-03-23 ENCOUNTER — Encounter: Payer: Self-pay | Admitting: Internal Medicine

## 2017-03-23 ENCOUNTER — Ambulatory Visit (INDEPENDENT_AMBULATORY_CARE_PROVIDER_SITE_OTHER): Payer: Medicare Other | Admitting: Internal Medicine

## 2017-03-23 VITALS — BP 138/62 | HR 60 | Temp 99.1°F | Ht 64.0 in | Wt 197.2 lb

## 2017-03-23 DIAGNOSIS — J849 Interstitial pulmonary disease, unspecified: Secondary | ICD-10-CM | POA: Diagnosis not present

## 2017-03-23 DIAGNOSIS — I10 Essential (primary) hypertension: Secondary | ICD-10-CM

## 2017-03-23 MED ORDER — LACTULOSE 10 GM/15ML PO SOLN: ORAL | 1 refills | Status: DC

## 2017-03-23 MED ORDER — RIVAROXABAN 20 MG PO TABS: 20.0000 mg | ORAL_TABLET | Freq: Every day | ORAL | 5 refills | Status: DC

## 2017-03-23 MED ORDER — ATORVASTATIN CALCIUM 20 MG PO TABS: 20.0000 mg | ORAL_TABLET | Freq: Every day | ORAL | 3 refills | Status: DC

## 2017-03-23 NOTE — Progress Notes (Signed)
Subjective:    Patient ID: Miranda [name], female    DOB: 31-May-1945, 72 y.o.   MRN: [id number]  HPI  72 year old patient who is seen today in follow-up.  She was seen last month with a asthma exacerbation and has done well.  She also has a history of interstitial lung disease and is scheduled for pulmonary follow-up soon. She has essential hypertension and type 2 diabetes. She states that she has discontinued losartan believing that it has elevated her blood pressure. She has a history of chronic hydrocodone use secondary to low back pain.  She was seen 2-1/2 months ago and urine drug screen failed to reveal any hydrocodone metabolites.  Past Medical History:  Diagnosis Date  . Anxiety   . Arthritis   . Asthma   . CHF (congestive heart failure) ([place])    pt. unsure- but thinks she was hosp. for CHF- 2002  . [facility]  . Colon polyps   . Complication of anesthesia    states requires a lot med. to put her to sleep   . DDD (degenerative disc disease) 09/17/2011  . Depression    "sometimes "  . DM (diabetes mellitus) ([place])   . Family history of anesthesia complication   . GERD (gastroesophageal reflux disease)   . Glaucoma    bilateral, pt. admits that she is noncompliant to eye gtts.   . Hemorrhoids   . Hyperlipidemia   . Hypertension    had stress, echo- 2006 /w [place], Cardiac Cath, per pt. 2002, echo repeated 2012- wnl   . Low back pain   . Shortness of breath   . Sleep apnea    uses c-pap- q night recently     Social History   Socioeconomic History  . Marital status: Married    Spouse name: Not on file  . Number of children: 4  . Years of education: Not on file  . Highest education level: Not on file  Social Needs  . Financial resource strain: Not on file  . Food insecurity - worry: Not on file  . Food insecurity - inability: Not on file  . Transportation needs - medical: Not on file  . Transportation needs - non-medical:  Not on file  Occupational History  . Occupation: retired Forensic psychologist: UNEMPLOYED  Tobacco Use  . Smoking status: Never Smoker  . Smokeless tobacco: Never Used  Substance and Sexual Activity  . Alcohol use: No    Alcohol/week: 0.0 oz  . Drug use: No  . Sexual activity: No  Other Topics Concern  . Not on file  Social History Narrative  . Not on file    Past Surgical History:  Procedure Laterality Date  . ABDOMINAL HYSTERECTOMY     ectopic, fibroids  . CARDIAC CATHETERIZATION    . CATARACT EXTRACTION, BILATERAL     cataracts removed bilateral- ?IOL  . COLONOSCOPY     remote  . FISSURECTOMY  10/08/2011   Procedure: FISSURECTOMY;  Surgeon: Stark Klein, MD;  Location: [facility];  Service: General;  Laterality: N/A;  . FLEXIBLE SIGMOIDOSCOPY  02/25/2011   Procedure: FLEXIBLE SIGMOIDOSCOPY;  Surgeon: Inda Castle, MD;  Location: [facility];  Service: Endoscopy;  Laterality: N/A;  . FOOT SURGERY     bilat, heel spurs- screw in R foot   . HEMORRHOID SURGERY  10/08/2011   Procedure: HEMORRHOIDECTOMY;  Surgeon: Stark Klein, MD;  Location: [facility];  Service: General;  Laterality: N/A;  External   . SPHINCTEROTOMY  10/08/2011   Procedure: SPHINCTEROTOMY;  Surgeon: Stark Klein, MD;  Location: [facility];  Service: General;  Laterality: N/A;    Family History  Problem Relation Age of Onset  . Heart attack Mother   . Heart disease Mother   . Breast cancer Mother   . Emphysema Sister   . Breast cancer Sister   . Asthma Sister   . Lung cancer Sister   . COPD Sister   . Heart disease Sister   . Rheum arthritis Sister   . Colon cancer Brother   . Lung cancer Sister   . Anesthesia problems Neg Hx     Allergies  Allergen Reactions  . Fish Oil Anaphylaxis  . Other Hives, Shortness Of Breath and Swelling    Allergic to cashew nuts and peanut oil.  Marland Kitchen Penicillins Anaphylaxis, Hives and Swelling    Has patient had a PCN reaction causing immediate rash, facial/tongue/throat swelling,  SOB or lightheadedness with hypotension: Face swelling and hives started first, then swelling of the throat  Has patient had a PCN reaction causing severe rash involving mucus membranes or skin necrosis: Yes  Has patient had a PCN reaction that required hospitalization: No  Has patient had a PCN reaction occurring within the last 10 years: Yes  If all of the above answers are "NO", then may proceed with Cephalospor  . Pneumococcal Vaccines Nausea And Vomiting  . Aspirin Itching and Rash  . Bactrim [Sulfamethoxazole-Trimethoprim] Hives, Itching and Rash  . Ciprofloxacin Hives, Itching and Rash  . Ibuprofen Rash  . Influenza Vaccines Hives  . Ivp Dye [Iodinated Diagnostic Agents] Hives, Itching and Rash    Gives benadryl to counteract symptoms  . Latex Rash  . Macrobid [Nitrofurantoin Monohydrate Macrocrystals] Hives  . Shellfish Allergy Hives    Patient also allergic to seafood    Current Outpatient Medications on File Prior to Visit  Medication Sig Dispense Refill  . albuterol (PROVENTIL HFA;VENTOLIN HFA) 108 (90 Base) MCG/ACT inhaler Inhale 1-2 puffs into the lungs every 6 (six) hours as needed for wheezing or shortness of breath.    . ALPRAZolam (XANAX) 0.25 MG tablet Take 1 tablet (0.25 mg total) by mouth 3 (three) times daily as needed for anxiety or sleep. 30 tablet 5  . amLODipine (NORVASC) 10 MG tablet Take 1 tablet (10 mg total) by mouth daily. 90 tablet 1  . cyclobenzaprine (FLEXERIL) 10 MG tablet Take 1 tablet (10 mg total) by mouth 3 (three) times daily as needed. 90 tablet 1  . escitalopram (LEXAPRO) 5 MG tablet Take 1 tablet (5 mg total) by mouth daily. 30 tablet 0  . Fluticasone-Salmeterol (ADVAIR) 250-50 MCG/DOSE AEPB Inhale 1 puff into the lungs every 12 (twelve) hours. 60 each 5  . furosemide (LASIX) 40 MG tablet Take 0.5 tablets (20 mg total) by mouth daily. 30 tablet 0  . HYDROcodone-acetaminophen (NORCO) 10-325 MG tablet Take 1 tablet by mouth every 6 (six) hours as  needed. 90 tablet 0  . losartan (COZAAR) 100 MG tablet Take 1 tablet (100 mg total) by mouth daily. 90 tablet 3  . metFORMIN (GLUCOPHAGE-XR) 500 MG 24 hr tablet Take 2 tablets (1,000 mg total) by mouth daily with breakfast. 180 tablet 3  . metoprolol tartrate (LOPRESSOR) 25 MG tablet Take 1 tablet (25 mg total) by mouth 2 (two) times daily. 180 tablet 6  . Pirfenidone (ESBRIET) 801 MG TABS Take 1 tablet by mouth 3 (three) times daily.  90 tablet 11   No current facility-administered medications on file prior to visit.     BP 138/62 (BP Location: Left Arm, Patient Position: Sitting, Cuff Size: Normal)   Pulse 60   Temp 99.1 F (37.3 C) (Oral)   Ht 5\' 4"  (1.626 m)   Wt 197 lb 3.2 oz (89.4 kg)   SpO2 97%   BMI 33.85 kg/m     Review of Systems  Constitutional: Negative.   HENT: Negative for congestion, dental problem, hearing loss, rhinorrhea, sinus pressure, sore throat and tinnitus.   Eyes: Negative for pain, discharge and visual disturbance.  Respiratory: Positive for cough and wheezing. Negative for shortness of breath.   Cardiovascular: Negative for chest pain, palpitations and leg swelling.  Gastrointestinal: Negative for abdominal distention, abdominal pain, blood in stool, constipation, diarrhea, nausea and vomiting.  Genitourinary: Negative for difficulty urinating, dysuria, flank pain, frequency, hematuria, pelvic pain, urgency, vaginal bleeding, vaginal discharge and vaginal pain.  Musculoskeletal: Positive for back pain. Negative for arthralgias, gait problem and joint swelling.  Skin: Negative for rash.  Neurological: Negative for dizziness, syncope, speech difficulty, weakness, numbness and headaches.  Hematological: Negative for adenopathy.  Psychiatric/Behavioral: Negative for agitation, behavioral problems and dysphoric mood. The patient is not nervous/anxious.        Objective:   Physical Exam  Constitutional: She is oriented to person, place, and time. She appears  well-developed and well-nourished.  Blood pressure 130/70  HENT:  Head: Normocephalic.  [name] Ear: External ear normal.  Left Ear: External ear normal.  Mouth/Throat: Oropharynx is clear and moist.  Eyes: Conjunctivae and EOM are normal. Pupils are equal, round, and reactive to light.  Neck: Normal range of motion. Neck supple. No thyromegaly present.  Cardiovascular: Normal rate, regular rhythm, normal heart sounds and intact distal pulses.  Pulmonary/Chest: Effort normal and breath sounds normal. No respiratory distress. She has no wheezes. She has no rales.  Abdominal: Soft. Bowel sounds are normal. She exhibits no mass. There is no tenderness.  Musculoskeletal: Normal range of motion.  Lymphadenopathy:    She has no cervical adenopathy.  Neurological: She is alert and oriented to person, place, and time.  Skin: Skin is warm and dry. No rash noted.  Psychiatric: She has a normal mood and affect. Her behavior is normal.          Assessment & Plan:   Essential hypertension. Dyslipidemia.  Will discontinue lovastatin and substitute atorvastatin Interstitial lung disease follow-up pulmonary medicine History of asthma exacerbation stable History of chronic low back pain.  History of negative urine drug screen for hydrocodone metabolites.  Patient is scheduled for follow-up next month.  We will repeat urine drug screen  Nyoka Cowden

## 2017-03-23 NOTE — Patient Instructions (Signed)
Limit your sodium (Salt) intake   Please check your hemoglobin A1c every 3 months    It is important that you exercise regularly, at least 20 minutes 3 to 4 times per week.  If you develop chest pain or shortness of breath seek  medical attention.  You need to lose weight.  Consider a lower calorie diet and regular exercise. 

## 2017-03-25 ENCOUNTER — Telehealth: Payer: Self-pay | Admitting: Pulmonary Disease

## 2017-03-25 NOTE — Telephone Encounter (Signed)
Received a fax from [facility] regarding a PA needed for pt's Esbriet.  PA initiated 03/25/17 on covermymeds, Key: [id number].  Called [facility] and also saw on covermymeds where med has been approved through 03/15/18.  Will close this encounter

## 2017-03-25 NOTE — Telephone Encounter (Signed)
Pt has not been seen in the office since Sept 2018 No recent labs or imaging studies Do see that the patient is scheduled for follow up with BQ this coming Monday 1.14.19 @ 1200   Called spoke with patient and informed her that the call was likely a reminder about her upcoming appt Pt voiced her understanding and will keep the appt  Nothing further needed; will sign off

## 2017-03-29 ENCOUNTER — Other Ambulatory Visit (INDEPENDENT_AMBULATORY_CARE_PROVIDER_SITE_OTHER): Payer: Medicare Other

## 2017-03-29 ENCOUNTER — Telehealth: Payer: Self-pay | Admitting: Internal Medicine

## 2017-03-29 ENCOUNTER — Encounter: Payer: Self-pay | Admitting: Pulmonary Disease

## 2017-03-29 ENCOUNTER — Ambulatory Visit (INDEPENDENT_AMBULATORY_CARE_PROVIDER_SITE_OTHER): Payer: Medicare Other | Admitting: Pulmonary Disease

## 2017-03-29 VITALS — BP 120/68 | HR 55 | Ht 64.0 in | Wt 197.0 lb

## 2017-03-29 DIAGNOSIS — J9601 Acute respiratory failure with hypoxia: Secondary | ICD-10-CM | POA: Diagnosis not present

## 2017-03-29 DIAGNOSIS — G4733 Obstructive sleep apnea (adult) (pediatric): Secondary | ICD-10-CM | POA: Diagnosis not present

## 2017-03-29 DIAGNOSIS — Z5181 Encounter for therapeutic drug level monitoring: Secondary | ICD-10-CM

## 2017-03-29 DIAGNOSIS — J84112 Idiopathic pulmonary fibrosis: Secondary | ICD-10-CM

## 2017-03-29 DIAGNOSIS — J849 Interstitial pulmonary disease, unspecified: Secondary | ICD-10-CM

## 2017-03-29 LAB — HEPATIC FUNCTION PANEL
ALBUMIN: 4 g/dL (ref 3.5–5.2)
ALT: 17 U/L (ref 0–35)
AST: 15 U/L (ref 0–37)
Alkaline Phosphatase: 49 U/L (ref 39–117)
Bilirubin, Direct: 0.1 mg/dL (ref 0.0–0.3)
Total Bilirubin: 0.4 mg/dL (ref 0.2–1.2)
Total Protein: 7.5 g/dL (ref 6.0–8.3)

## 2017-03-29 MED ORDER — PIRFENIDONE 801 MG PO TABS: 1.0000 | ORAL_TABLET | Freq: Three times a day (TID) | ORAL | 11 refills | Status: DC

## 2017-03-29 NOTE — Telephone Encounter (Signed)
Copied from [place] [id number]. Topic: Quick Communication - Rx Refill/Question >> Mar 29, 2017  2:04 PM Lolita Rieger, [place] wrote: Medication: hydrocodone 10-325   Has the patient contacted their pharmacy? no   (Agent: If no, request that the patient contact the pharmacy for the refill.)   Preferred Pharmacy (with phone number or street name): Must pick up  Agent: Please be advised that RX refills may take up to 3 business days. We ask that you follow-up with your pharmacy.

## 2017-03-29 NOTE — Patient Instructions (Signed)
Idiopathic pulmonary fibrosis: Continue taking Esbriet 3 times a day We will check a liver function test today to make sure there is no evidence of toxicity from the medicine We will get a lung function test and 6-minute walk on the next visit  We will see you back in March

## 2017-03-29 NOTE — Progress Notes (Signed)
Subjective:    Patient ID: Miranda Miranda [name], female    DOB: 08-Nov-1945, 72 y.o.   MRN: [id number]  Synopsis: Former patient of Dr. Gwenette [name] who was hospitalized in January 2017 with pneumonia and had findings on her CT chest which were worrisome for pulmonary fibrosis.In February 2018 she had a CT angiogram of her chest diagnosing a pulmonary embolism. This is felt to be idiopathic. A follow-up CT scan of the chest in March 2018 showed usual interstitial pneumonitis.  Serologic blood work showed no evidence of a connective tissue disease.    HPI Chief Complaint  Patient presents with  . Follow-up    pt states she is doing well presently.     She has had some problems with back pain and she had bronchitis in December but otherwise she has been doing well.  She said that she thinks that her breathing has improved.  No significant cough Miranda [name] now.  She tries to exercise but she is limited by back pain.  She will get somewhat short of breath if she exerts herself too quickly.  She says that she was treated with antibiotics for bronchitis and has improved.  She continues to take Esbriet regularly.  She has not had a rash or recent upset stomach or diarrhea.   Past Medical History:  Diagnosis Date  . Anxiety   . Arthritis   . Asthma   . CHF (congestive heart failure) ([place])    pt. unsure- but thinks she was hosp. for CHF- 2002  . [facility]    [facility]  . Colon polyps   . Complication of anesthesia    states requires a lot med. to put her to sleep   . DDD (degenerative disc disease) 09/17/2011  . Depression    "sometimes "  . DM (diabetes mellitus) ([place])   . Family history of anesthesia complication   . GERD (gastroesophageal reflux disease)   . Glaucoma    bilateral, pt. admits that she is noncompliant to eye gtts.   . Hemorrhoids   . Hyperlipidemia   . Hypertension    had stress, echo- 2006 /w [place], Cardiac Cath, per pt. 2002, echo repeated 2012- wnl   .  Low back pain   . Shortness of breath   . Sleep apnea    uses c-pap- q night recently      Review of Systems  Constitutional: Positive for fatigue. Negative for chills and fever.  HENT: Negative for postnasal drip, rhinorrhea and sinus pain.   Respiratory: Positive for shortness of breath. Negative for cough and wheezing.   Cardiovascular: Negative for chest pain, palpitations and leg swelling.       Objective:   Physical Exam Vitals:   03/29/17 1225  BP: 120/68  Pulse: (!) 55  SpO2: 98%  Weight: 197 lb (89.4 kg)  Height: 5\' 4"  (1.626 m)   RA  Gen: well appearing HENT: OP clear, TM's clear, neck supple PULM: Crackles R base B, normal percussion CV: RRR, II/VI systolic murmur RUSB, trace edema GI: BS+, soft, nontender Derm: no cyanosis or rash Psyche: normal mood and affect    Sleep study: 10/2016 PSG> AHI 10.5   Labs: March 2018 LFT normal, aldolase normal, CRP slightly low, sedimentation rate slightly elevated 58, hypersensitivity pneumonitis panel negative, ANA negative, anti-Jo 1 negative, rheumatoid factor negative, SSA negative, SSB negative, SCL 70 negative  PFT Pulmonary function testing March 2017 showed a ratio of 84%, FVC 1.96 L (77% predicted),  total lung capacity 3.85 L (74% predicted), DLCO 14.32 (55% predicted) March 2018 pulmonary function test ratio normal, FVC 2.25 L 90% predicted, total lung capacity 3.54 L 67% predicted, residual volume 1.26 L 56% predicted, DLCO 14.26 55% predicted September 2018 ratio normal, FVC 2.05 L 82% predicted, total lung capacity 3.87 L 74% predicted, DLCO 15.38 60%   Imaging: February 2018: Images independently reviewed, compared to April 2017 there is no significant change in the peripheral and basilar predominant honeycomb change, traction bronchiectasis with significant patches of normal pulmonary parenchyma. Consistent with usual interstitial pneumonitis.     Assessment & Plan:  ILD (interstitial lung disease)  ([place]) - Plan: Pulmonary function test  Therapeutic drug monitoring - Plan: Hepatic function panel  IPF (idiopathic pulmonary fibrosis) ([facility])  Discussion: This is been a stable interval for Miranda Miranda [name] with the exception of one episode of bronchitis about a month ago.  She has not had a complication from the Esbriet.  She needs to continue on this medicine indefinitely.  Plan: Idiopathic pulmonary fibrosis: Continue taking Esbriet 3 times a day We will check a liver function test today to make sure there is no evidence of toxicity from the medicine We will get a lung function test and 6-minute walk on the next visit  We will see you back in March   Current Outpatient Medications:  .  albuterol (PROVENTIL HFA;VENTOLIN HFA) 108 (90 Base) MCG/ACT inhaler, Inhale 1-2 puffs into the lungs every 6 (six) hours as needed for wheezing or shortness of breath., Disp: , Rfl:  .  ALPRAZolam (XANAX) 0.25 MG tablet, Take 1 tablet (0.25 mg total) by mouth 3 (three) times daily as needed for anxiety or sleep., Disp: 30 tablet, Rfl: 5 .  amLODipine (NORVASC) 10 MG tablet, Take 1 tablet (10 mg total) by mouth daily., Disp: 90 tablet, Rfl: 1 .  atorvastatin (LIPITOR) 20 MG tablet, Take 1 tablet (20 mg total) by mouth daily., Disp: 90 tablet, Rfl: 3 .  cyclobenzaprine (FLEXERIL) 10 MG tablet, Take 1 tablet (10 mg total) by mouth 3 (three) times daily as needed., Disp: 90 tablet, Rfl: 1 .  escitalopram (LEXAPRO) 5 MG tablet, Take 1 tablet (5 mg total) by mouth daily., Disp: 30 tablet, Rfl: 0 .  Fluticasone-Salmeterol (ADVAIR) 250-50 MCG/DOSE AEPB, Inhale 1 puff into the lungs every 12 (twelve) hours., Disp: 60 each, Rfl: 5 .  furosemide (LASIX) 40 MG tablet, Take 0.5 tablets (20 mg total) by mouth daily., Disp: 30 tablet, Rfl: 0 .  HYDROcodone-acetaminophen (NORCO) 10-325 MG tablet, Take 1 tablet by mouth every 6 (six) hours as needed., Disp: 90 tablet, Rfl: 0 .  lactulose (CONSTULOSE) 10 GM/15ML solution, TAKE  30 MLS BY MOUTH 3 TIMES DAILY AS NEEDED FOR MILD CONSTIPATION, Disp: 473 mL, Rfl: 1 .  losartan (COZAAR) 100 MG tablet, Take 1 tablet (100 mg total) by mouth daily., Disp: 90 tablet, Rfl: 3 .  metFORMIN (GLUCOPHAGE-XR) 500 MG 24 hr tablet, Take 2 tablets (1,000 mg total) by mouth daily with breakfast., Disp: 180 tablet, Rfl: 3 .  metoprolol tartrate (LOPRESSOR) 25 MG tablet, Take 1 tablet (25 mg total) by mouth 2 (two) times daily., Disp: 180 tablet, Rfl: 6 .  Pirfenidone (ESBRIET) 801 MG TABS, Take 1 tablet by mouth 3 (three) times daily., Disp: 90 tablet, Rfl: 11 .  rivaroxaban (XARELTO) 20 MG TABS tablet, Take 1 tablet (20 mg total) by mouth daily with supper., Disp: 30 tablet, Rfl: 5

## 2017-03-30 ENCOUNTER — Telehealth: Payer: Self-pay | Admitting: Pulmonary Disease

## 2017-03-30 DIAGNOSIS — G4733 Obstructive sleep apnea (adult) (pediatric): Secondary | ICD-10-CM | POA: Diagnosis not present

## 2017-03-30 NOTE — Telephone Encounter (Signed)
Called and spoke with pt and she stated that she spoke with the specialty pharmacy and they will get this out to her by Thursday.  Nothing further is needed.

## 2017-03-31 NOTE — Telephone Encounter (Signed)
Can not refill medication without an OV. Appt made for 04/07/17

## 2017-04-07 ENCOUNTER — Ambulatory Visit: Payer: Medicare Other | Admitting: Internal Medicine

## 2017-04-08 ENCOUNTER — Ambulatory Visit (INDEPENDENT_AMBULATORY_CARE_PROVIDER_SITE_OTHER): Payer: Medicare Other | Admitting: Internal Medicine

## 2017-04-08 ENCOUNTER — Encounter: Payer: Self-pay | Admitting: Internal Medicine

## 2017-04-08 ENCOUNTER — Encounter: Payer: Self-pay | Admitting: Adult Health

## 2017-04-08 ENCOUNTER — Ambulatory Visit (INDEPENDENT_AMBULATORY_CARE_PROVIDER_SITE_OTHER): Payer: Medicare Other | Admitting: Adult Health

## 2017-04-08 VITALS — BP 130/60 | HR 61 | Temp 98.7°F | Ht 64.0 in | Wt 199.6 lb

## 2017-04-08 DIAGNOSIS — G4733 Obstructive sleep apnea (adult) (pediatric): Secondary | ICD-10-CM | POA: Diagnosis not present

## 2017-04-08 DIAGNOSIS — Z5181 Encounter for therapeutic drug level monitoring: Secondary | ICD-10-CM | POA: Diagnosis not present

## 2017-04-08 DIAGNOSIS — F1190 Opioid use, unspecified, uncomplicated: Secondary | ICD-10-CM

## 2017-04-08 DIAGNOSIS — M545 Low back pain: Secondary | ICD-10-CM

## 2017-04-08 DIAGNOSIS — G8929 Other chronic pain: Secondary | ICD-10-CM

## 2017-04-08 MED ORDER — METFORMIN HCL ER 500 MG PO TB24: 1000.0000 mg | ORAL_TABLET | Freq: Every day | ORAL | 3 refills | Status: DC

## 2017-04-08 MED ORDER — HYDROCODONE-ACETAMINOPHEN 10-325 MG PO TABS: 1.0000 | ORAL_TABLET | Freq: Four times a day (QID) | ORAL | 0 refills | Status: DC | PRN

## 2017-04-08 NOTE — Progress Notes (Signed)
   Subjective:    Patient ID: Miranda [name], female    DOB: [birth year], 72 y.o.   MRN: [id number]  HPI 72 year old patient who is seen today for pain management evaluation per opioid protocol  [place] printed, initialed  and scanned into the EMR.  She has been refilling hydrocodone monthly #90  Opioid pain contract reviewed and signed with patient last visit  Indication for chronic opioid: patient has degenerative joint disease and chronic low back pain. Medication and dose:hydrocodone 5 mg-acetaminophen 325 mg # pills per month: 90  Last UDS date: 12/28/16.  No hydrocodone metabolites noted at that time.  We will repeat today Pain contract signed (Y/N): yes Date narcotic database last reviewed (include red flags):  April 08, 2016   Review of Systems     Objective:   Physical Exam        Assessment & Plan:    Encounter for chronic pain management (G89.29) Narcotic use  (711.90) Pain management contract signed (Z02.89)  Nyoka Cowden

## 2017-04-08 NOTE — Assessment & Plan Note (Signed)
Compensated on CPAP   Plan  Patient Instructions  Continue on CPAP At bedtime   Wear each night .  Do not drive if sleepy  Work on healthy weight .  Follow up in 2  months with Dr. [name] as planned.

## 2017-04-08 NOTE — Patient Instructions (Addendum)
Continue on CPAP At bedtime   Wear each night .  Do not drive if sleepy  Work on healthy weight .  Follow up in 2  months with Dr. [name] as planned.

## 2017-04-08 NOTE — Patient Instructions (Addendum)
Please discussed with Dr. [name] his recommendations for duration of anticoagulation  Return in 3 months for follow-up

## 2017-04-08 NOTE — Progress Notes (Signed)
@Patient  ID: Miranda [name], female    DOB: 11-Nov-1945, 72 y.o.   MRN: [id number]  Chief Complaint  Patient presents with  . Follow-up    OSA     Referring provider: Marletta Lor, MD  HPI: 72 yo female followed for OSA  She has IPF on Esbriet  Dx with PE 04/2016 on Xarelto   TEST  Sleep study 10/2016 AHI 10/hr    04/08/2017 Follow up ; OSA  Pt returns for follow-up for sleep apnea.  She recently got a new CPAP machine.  She says she has been doing well for the last 2 weeks had some problems initially with the machine.  But feels more rested since she has begun wearing it.  She is getting about 7-8 hours.  Download shows excellent compliance over the last 18 days.  Average usage at 7.5 hours.  Patient is on CPAP AutoSet 5-20 Summers H2O.  AHI 1.5.  Minimum leaks.  Got new machine     Allergies  Allergen Reactions  . Fish Oil Anaphylaxis  . Other Hives, Shortness Of Breath and Swelling    Allergic to cashew nuts and peanut oil.  Marland Kitchen Penicillins Anaphylaxis, Hives and Swelling    Has patient had a PCN reaction causing immediate rash, facial/tongue/throat swelling, SOB or lightheadedness with hypotension: Face swelling and hives started first, then swelling of the throat  Has patient had a PCN reaction causing severe rash involving mucus membranes or skin necrosis: Yes  Has patient had a PCN reaction that required hospitalization: No  Has patient had a PCN reaction occurring within the last 10 years: Yes  If all of the above answers are "NO", then may proceed with Cephalospor  . Pneumococcal Vaccines Nausea And Vomiting  . Aspirin Itching and Rash  . Bactrim [Sulfamethoxazole-Trimethoprim] Hives, Itching and Rash  . Ciprofloxacin Hives, Itching and Rash  . Ibuprofen Rash  . Influenza Vaccines Hives  . Ivp Dye [Iodinated Diagnostic Agents] Hives, Itching and Rash    Gives benadryl to counteract symptoms  . Latex Rash  . Macrobid [Nitrofurantoin Monohydrate  Macrocrystals] Hives  . Shellfish Allergy Hives    Patient also allergic to seafood    Immunization History  Administered Date(s) Administered  . PPD Test 11/19/2010    Past Medical History:  Diagnosis Date  . Anxiety   . Arthritis   . Asthma   . CHF (congestive heart failure) ([place])    pt. unsure- but thinks she was hosp. for CHF- 2002  . [facility] kidney disease    [facility]  . Colon polyps   . Complication of anesthesia    states requires a lot med. to put her to sleep   . DDD (degenerative disc disease) 09/17/2011  . Depression    "sometimes "  . DM (diabetes mellitus) ([place])   . Family history of anesthesia complication   . GERD (gastroesophageal reflux disease)   . Glaucoma    bilateral, pt. admits that she is noncompliant to eye gtts.   . Hemorrhoids   . Hyperlipidemia   . Hypertension    had stress, echo- 2006 /w [place], Cardiac Cath, per pt. 2002, echo repeated 2012- wnl   . Low back pain   . Shortness of breath   . Sleep apnea    uses c-pap- q night recently    Tobacco History: Social History   Tobacco Use  Smoking Status Never Smoker  Smokeless Tobacco Never Used   Counseling given: Not Answered  Outpatient Encounter Medications as of 04/08/2017  Medication Sig  . albuterol (PROVENTIL HFA;VENTOLIN HFA) 108 (90 Base) MCG/ACT inhaler Inhale 1-2 puffs into the lungs every 6 (six) hours as needed for wheezing or shortness of breath.  . ALPRAZolam (XANAX) 0.25 MG tablet Take 1 tablet (0.25 mg total) by mouth 3 (three) times daily as needed for anxiety or sleep.  Marland Kitchen amLODipine (NORVASC) 10 MG tablet Take 1 tablet (10 mg total) by mouth daily.  Marland Kitchen atorvastatin (LIPITOR) 20 MG tablet Take 1 tablet (20 mg total) by mouth daily.  . cyclobenzaprine (FLEXERIL) 10 MG tablet Take 1 tablet (10 mg total) by mouth 3 (three) times daily as needed.  Marland Kitchen escitalopram (LEXAPRO) 5 MG tablet Take 1 tablet (5 mg total) by mouth daily.  . Fluticasone-Salmeterol (ADVAIR)  250-50 MCG/DOSE AEPB Inhale 1 puff into the lungs every 12 (twelve) hours.  . furosemide (LASIX) 40 MG tablet Take 0.5 tablets (20 mg total) by mouth daily.  Marland Kitchen HYDROcodone-acetaminophen (NORCO) 10-325 MG tablet Take 1 tablet by mouth every 6 (six) hours as needed.  . lactulose (CONSTULOSE) 10 GM/15ML solution TAKE 30 MLS BY MOUTH 3 TIMES DAILY AS NEEDED FOR MILD CONSTIPATION  . losartan (COZAAR) 100 MG tablet Take 1 tablet (100 mg total) by mouth daily.  . metFORMIN (GLUCOPHAGE-XR) 500 MG 24 hr tablet Take 2 tablets (1,000 mg total) by mouth daily with breakfast.  . metoprolol tartrate (LOPRESSOR) 25 MG tablet Take 1 tablet (25 mg total) by mouth 2 (two) times daily.  . Pirfenidone (ESBRIET) 801 MG TABS Take 1 tablet by mouth 3 (three) times daily.  . rivaroxaban (XARELTO) 20 MG TABS tablet Take 1 tablet (20 mg total) by mouth daily with supper.  . [DISCONTINUED] HYDROcodone-acetaminophen (NORCO) 10-325 MG tablet Take 1 tablet by mouth every 6 (six) hours as needed.  . [DISCONTINUED] metFORMIN (GLUCOPHAGE-XR) 500 MG 24 hr tablet Take 2 tablets (1,000 mg total) by mouth daily with breakfast.   No facility-administered encounter medications on file as of 04/08/2017.      Review of Systems  Constitutional:   No  weight loss, night sweats,  Fevers, chills, fatigue, or  lassitude.  HEENT:   No headaches,  Difficulty swallowing,  Tooth/dental problems, or  Sore throat,                No sneezing, itching, ear ache, nasal congestion, post nasal drip,   CV:  No chest pain,  Orthopnea, PND, swelling in lower extremities, anasarca, dizziness, palpitations, syncope.   GI  No heartburn, indigestion, abdominal pain, nausea, vomiting, diarrhea, change in bowel habits, loss of appetite, bloody stools.   Resp:  No chest wall deformity  Skin: no rash or lesions.  GU: no dysuria, change in color of urine, no urgency or frequency.  No flank pain, no hematuria   MS:  No joint pain or swelling.  No  decreased range of motion.  No back pain.    Physical Exam  BP 126/70 (BP Location: Left Arm, Cuff Size: Normal)   Pulse 60   Ht 5\' 5"  (1.651 m)   Wt 199 lb 12.8 oz (90.6 kg)   SpO2 100%   BMI 33.25 kg/m   GEN: A/Ox3; pleasant , NAD, well nourished    HEENT:  [employer]/AT,  EACs-clear, TMs-wnl, NOSE-clear, THROAT-clear, no lesions, no postnasal drip or exudate noted.   NECK:  Supple w/ fair ROM; no JVD; normal carotid impulses w/o bruits; no thyromegaly or nodules palpated; no lymphadenopathy.  RESP  Faint bb crackles . no accessory muscle use, no dullness to percussion  CARD:  RRR, no m/r/g, no peripheral edema, pulses intact, no cyanosis or clubbing.  GI:   Soft & nt; nml bowel sounds; no organomegaly or masses detected.   Musco: Warm bil, no deformities or joint swelling noted.   Neuro: alert, no focal deficits noted.    Skin: Warm, no lesions or rashes    Lab Results:  CBC   BNP No results found for: BNP  ProBNP  Imaging: No results found.   Assessment & Plan:   OSA (obstructive sleep apnea) Compensated on CPAP   Plan  Patient Instructions  Continue on CPAP At bedtime   Wear each night .  Do not drive if sleepy  Work on healthy weight .  Follow up in 2  months with Dr. [name] as planned.           Rexene Edison, NP 04/08/2017

## 2017-04-09 NOTE — Progress Notes (Signed)
Reviewed, agree 

## 2017-04-12 LAB — PAIN MGMT, PROFILE 8 W/CONF, U
6 Acetylmorphine: NEGATIVE ng/mL (ref <10)
ALPHAHYDROXYMIDAZOLAM: NEGATIVE ng/mL (ref <50)
AMINOCLONAZEPAM: NEGATIVE ng/mL (ref <25)
Alcohol Metabolites: NEGATIVE ng/mL (ref <500)
Alphahydroxyalprazolam: NEGATIVE ng/mL (ref <25)
Alphahydroxytriazolam: NEGATIVE ng/mL (ref <50)
Amphetamines: NEGATIVE ng/mL (ref <500)
BUPRENORPHINE, URINE: NEGATIVE ng/mL (ref <5)
Benzodiazepines: NEGATIVE ng/mL (ref <100)
COCAINE METABOLITE: NEGATIVE ng/mL (ref <150)
CREATININE: 32.9 mg/dL
Codeine: NEGATIVE ng/mL (ref <50)
HYDROCODONE: 719 ng/mL — AB (ref <50)
HYDROXYETHYLFLURAZEPAM: NEGATIVE ng/mL (ref <50)
Hydromorphone: 97 ng/mL — ABNORMAL HIGH (ref <50)
LORAZEPAM: NEGATIVE ng/mL (ref <50)
MARIJUANA METABOLITE: NEGATIVE ng/mL (ref <20)
MDMA: NEGATIVE ng/mL (ref <500)
MORPHINE: NEGATIVE ng/mL (ref <50)
NORDIAZEPAM: NEGATIVE ng/mL (ref <50)
Norhydrocodone: 747 ng/mL — ABNORMAL HIGH (ref <50)
OXIDANT: NEGATIVE ug/mL (ref <200)
Opiates: POSITIVE ng/mL — AB (ref <100)
Oxazepam: NEGATIVE ng/mL (ref <50)
Oxycodone: NEGATIVE ng/mL (ref <100)
PH: 6.98 (ref 4.5–9.0)
TEMAZEPAM: NEGATIVE ng/mL (ref <50)

## 2017-04-30 DIAGNOSIS — G4733 Obstructive sleep apnea (adult) (pediatric): Secondary | ICD-10-CM | POA: Diagnosis not present

## 2017-05-17 ENCOUNTER — Other Ambulatory Visit: Payer: Self-pay | Admitting: Internal Medicine

## 2017-05-26 ENCOUNTER — Other Ambulatory Visit: Payer: Self-pay | Admitting: Internal Medicine

## 2017-05-26 NOTE — Telephone Encounter (Signed)
LOV: 04/08/17  [name]- [address]

## 2017-05-26 NOTE — Telephone Encounter (Signed)
Copied from [place]. Topic: Quick Communication - Rx Refill/Question >> May 26, 2017 12:49 PM Cecelia Byars, NT wrote: Medication: ALPRAZolam Duanne Moron) 0.25 MG tablet Has the patient contacted their pharmacy? {yes  (Agent: If no, request that the patient contact the pharmacy for the refill. Preferred Pharmacy (with phone number or street name): [facility] ([address]), [place] [phone] (Phone) [phone] (Fax Agent: Please be advised that RX refills may take up to 3 business days. We ask that you follow-up with your pharmacy.

## 2017-05-27 MED ORDER — ALPRAZOLAM 0.25 MG PO TABS: 0.2500 mg | ORAL_TABLET | Freq: Three times a day (TID) | ORAL | 5 refills | Status: DC | PRN

## 2017-05-28 DIAGNOSIS — G4733 Obstructive sleep apnea (adult) (pediatric): Secondary | ICD-10-CM | POA: Diagnosis not present

## 2017-06-07 ENCOUNTER — Ambulatory Visit (INDEPENDENT_AMBULATORY_CARE_PROVIDER_SITE_OTHER): Payer: Medicare Other | Admitting: Family Medicine

## 2017-06-07 ENCOUNTER — Encounter: Payer: Self-pay | Admitting: Family Medicine

## 2017-06-07 ENCOUNTER — Ambulatory Visit: Payer: Medicare Other

## 2017-06-07 VITALS — BP 116/74 | HR 65 | Temp 97.7°F | Wt 196.0 lb

## 2017-06-07 DIAGNOSIS — B9789 Other viral agents as the cause of diseases classified elsewhere: Secondary | ICD-10-CM | POA: Diagnosis not present

## 2017-06-07 DIAGNOSIS — R0982 Postnasal drip: Secondary | ICD-10-CM | POA: Diagnosis not present

## 2017-06-07 DIAGNOSIS — J069 Acute upper respiratory infection, unspecified: Secondary | ICD-10-CM

## 2017-06-07 MED ORDER — FLUTICASONE PROPIONATE 50 MCG/ACT NA SUSP: 1.0000 | Freq: Every day | NASAL | 0 refills | Status: DC

## 2017-06-07 MED ORDER — PREDNISONE 20 MG PO TABS: 40.0000 mg | ORAL_TABLET | Freq: Every day | ORAL | 0 refills | Status: AC

## 2017-06-07 MED ORDER — BENZONATATE 100 MG PO CAPS: 100.0000 mg | ORAL_CAPSULE | Freq: Two times a day (BID) | ORAL | 0 refills | Status: DC | PRN

## 2017-06-07 NOTE — Progress Notes (Signed)
Subjective:    Patient ID: Miranda [name], female    DOB: [birth year], 72 y.o.   MRN: [id number]  Chief Complaint  Patient presents with  . Cough    HPI Patient was seen today for acute concern.  Pt endorses cough, sore throat, subjective fever T-max 100.1, headache, rhinorrhea x 1 wk.  Pt has taken Mucinex and other OTC cough and cold medicine for her symptoms.  Contacts include pt's grandkids who have had colds.  Pt states she has an inhaler for asthma, but was told not to use it that often.  Past Medical History:  Diagnosis Date  . Anxiety   . Arthritis   . Asthma   . CHF (congestive heart failure) ([place])    pt. unsure- but thinks she was hosp. for CHF- 2002  . [facility] kidney disease    [facility]  . Colon polyps   . Complication of anesthesia    states requires a lot med. to put her to sleep   . DDD (degenerative disc disease) 09/17/2011  . Depression    "sometimes "  . DM (diabetes mellitus) ([place])   . Family history of anesthesia complication   . GERD (gastroesophageal reflux disease)   . Glaucoma    bilateral, pt. admits that she is noncompliant to eye gtts.   . Hemorrhoids   . Hyperlipidemia   . Hypertension    had stress, echo- 2006 /w [place], Cardiac Cath, per pt. 2002, echo repeated 2012- wnl   . Low back pain   . Shortness of breath   . Sleep apnea    uses c-pap- q night recently    Allergies  Allergen Reactions  . Fish Oil Anaphylaxis  . Other Hives, Shortness Of Breath and Swelling    Allergic to cashew nuts and peanut oil.  Marland Kitchen Penicillins Anaphylaxis, Hives and Swelling    Has patient had a PCN reaction causing immediate rash, facial/tongue/throat swelling, SOB or lightheadedness with hypotension: Face swelling and hives started first, then swelling of the throat  Has patient had a PCN reaction causing severe rash involving mucus membranes or skin necrosis: Yes  Has patient had a PCN reaction that required hospitalization: No  Has patient had a  PCN reaction occurring within the last 10 years: Yes  If all of the above answers are "NO", then may proceed with Cephalospor  . Pneumococcal Vaccines Nausea And Vomiting  . Aspirin Itching and Rash  . Bactrim [Sulfamethoxazole-Trimethoprim] Hives, Itching and Rash  . Ciprofloxacin Hives, Itching and Rash  . Ibuprofen Rash  . Influenza Vaccines Hives  . Ivp Dye [Iodinated Diagnostic Agents] Hives, Itching and Rash    Gives benadryl to counteract symptoms  . Latex Rash  . Macrobid [Nitrofurantoin Monohydrate Macrocrystals] Hives  . Shellfish Allergy Hives    Patient also allergic to seafood    ROS General: Denies fever, chills, night sweats, changes in weight, changes in appetite  +subjective fever HEENT: Denies ear pain, changes in vision   +sore throat, rhinorrhea, HA  CV: Denies CP, palpitations, SOB, orthopnea Pulm: Denies SOB, wheezing  +cough GI: Denies abdominal pain, nausea, vomiting, diarrhea, constipation GU: Denies dysuria, hematuria, frequency, vaginal discharge Msk: Denies muscle cramps, joint pains Neuro: Denies weakness, numbness, tingling Skin: Denies rashes, bruising Psych: Denies depression, anxiety, hallucinations     Objective:    Blood pressure 116/74, pulse 65, temperature 97.7 F (36.5 C), temperature source Oral, weight 196 lb (88.9 kg), SpO2 91 %.   Gen. Pleasant, well-nourished,  in no distress, normal affect   HEENT: [place]/AT, face symmetric, no scleral icterus, PERRLA, nares patent without drainage, pharynx with mild erythema and post nasal drainage, no exudate.  TMs normal bilaterally.  No cervical lymphadenopathy. Lungs: Cough, no accessory muscle use, faint wheezes throughout, no rales Cardiovascular: RRR, no m/r/g, no peripheral edema Abdomen: BS present, soft, NT/ND Neuro:  A&Ox3, CN II-XII intact, normal gait   Wt Readings from Last 3 Encounters:  06/07/17 196 lb (88.9 kg)  04/08/17 199 lb 12.8 oz (90.6 kg)  04/08/17 199 lb 9.6 oz (90.5 kg)     Lab Results  Component Value Date   WBC 7.1 03/01/2017   HGB 13.5 03/01/2017   HCT 41.8 03/01/2017   PLT 211.0 03/01/2017   GLUCOSE 153 (H) 05/02/2016   CHOL 195 06/01/2014   TRIG 84.0 06/01/2014   HDL 55.10 06/01/2014   LDLDIRECT 152.8 03/30/2011   LDLCALC 123 (H) 06/01/2014   ALT 17 03/29/2017   AST 15 03/29/2017   NA 139 05/02/2016   K 4.3 05/02/2016   CL 105 05/02/2016   CREATININE 0.84 05/02/2016   BUN 14 05/02/2016   CO2 27 05/02/2016   TSH 0.86 06/01/2014   INR 1.10 05/01/2016   HGBA1C 5.9 12/07/2016    Assessment/Plan:  Viral URI with cough  -Supportive care - Plan: benzonatate (TESSALON) 100 MG capsule, predniSONE (DELTASONE) 20 MG tablet  Post-nasal drainage  - Plan: fluticasone (FLONASE) 50 MCG/ACT nasal spray  Follow-up PRN with PCP  Grier Mitts, MD

## 2017-06-07 NOTE — Patient Instructions (Signed)
Upper Respiratory Infection, Adult Most upper respiratory infections (URIs) are a viral infection of the air passages leading to the lungs. A URI affects the nose, throat, and upper air passages. The most common type of URI is nasopharyngitis and is typically referred to as "the common cold." URIs run their course and usually go away on their own. Most of the time, a URI does not require medical attention, but sometimes a bacterial infection in the upper airways can follow a viral infection. This is called a secondary infection. Sinus and middle ear infections are common types of secondary upper respiratory infections. Bacterial pneumonia can also complicate a URI. A URI can worsen asthma and chronic obstructive pulmonary disease (COPD). Sometimes, these complications can require emergency medical care and may be life threatening. What are the causes? Almost all URIs are caused by viruses. A virus is a type of germ and can spread from one person to another. What increases the risk? You may be at risk for a URI if:  You smoke.  You have chronic heart or lung disease.  You have a weakened defense (immune) system.  You are very young or very old.  You have nasal allergies or asthma.  You work in crowded or poorly ventilated areas.  You work in health care facilities or schools.  What are the signs or symptoms? Symptoms typically develop 2-3 days after you come in contact with a cold virus. Most viral URIs last 7-10 days. However, viral URIs from the influenza virus (flu virus) can last 14-18 days and are typically more severe. Symptoms may include:  Runny or stuffy (congested) nose.  Sneezing.  Cough.  Sore throat.  Headache.  Fatigue.  Fever.  Loss of appetite.  Pain in your forehead, behind your eyes, and over your cheekbones (sinus pain).  Muscle aches.  How is this diagnosed? Your health care provider may diagnose a URI by:  Physical exam.  Tests to check that your  symptoms are not due to another condition such as: ? Strep throat. ? Sinusitis. ? Pneumonia. ? Asthma.  How is this treated? A URI goes away on its own with time. It cannot be cured with medicines, but medicines may be prescribed or recommended to relieve symptoms. Medicines may help:  Reduce your fever.  Reduce your cough.  Relieve nasal congestion.  Follow these instructions at home:  Take medicines only as directed by your health care provider.  Gargle warm saltwater or take cough drops to comfort your throat as directed by your health care provider.  Use a warm mist humidifier or inhale steam from a shower to increase air moisture. This may make it easier to breathe.  Drink enough fluid to keep your urine clear or pale yellow.  Eat soups and other clear broths and maintain good nutrition.  Rest as needed.  Return to work when your temperature has returned to normal or as your health care provider advises. You may need to stay home longer to avoid infecting others. You can also use a face mask and careful hand washing to prevent spread of the virus.  Increase the usage of your inhaler if you have asthma.  Do not use any tobacco products, including cigarettes, chewing tobacco, or electronic cigarettes. If you need help quitting, ask your health care provider. How is this prevented? The best way to protect yourself from getting a cold is to practice good hygiene.  Avoid oral or hand contact with people with cold symptoms.  Wash your   hands often if contact occurs.  There is no clear evidence that vitamin C, vitamin E, echinacea, or exercise reduces the chance of developing a cold. However, it is always recommended to get plenty of rest, exercise, and practice good nutrition. Contact a health care provider if:  You are getting worse rather than better.  Your symptoms are not controlled by medicine.  You have chills.  You have worsening shortness of breath.  You have  brown or red mucus.  You have yellow or brown nasal discharge.  You have pain in your face, especially when you bend forward.  You have a fever.  You have swollen neck glands.  You have pain while swallowing.  You have white areas in the back of your throat. Get help right away if:  You have severe or persistent: ? Headache. ? Ear pain. ? Sinus pain. ? Chest pain.  You have chronic lung disease and any of the following: ? Wheezing. ? Prolonged cough. ? Coughing up blood. ? A change in your usual mucus.  You have a stiff neck.  You have changes in your: ? Vision. ? Hearing. ? Thinking. ? Mood. This information is not intended to replace advice given to you by your health care provider. Make sure you discuss any questions you have with your health care provider. Document Released: 08/26/2000 Document Revised: 11/03/2015 Document Reviewed: 06/07/2013 Elsevier Interactive Patient Education  2018 [employer]  Postnasal Drip Postnasal drip is the feeling of mucus going down the back of your throat. Mucus is a slimy substance that moistens and cleans your nose and throat, as well as the air pockets in face bones near your forehead and cheeks (sinuses). Small amounts of mucus pass from your nose and sinuses down the back of your throat all the time. This is normal. When you produce too much mucus or the mucus gets too thick, you can feel it. Some common causes of postnasal drip include:  Having more mucus because of: ? A cold or the flu. ? Allergies. ? Cold air. ? Certain medicines.  Having more mucus that is thicker because of: ? A sinus or nasal infection. ? Dry air. ? A food allergy.  Follow these instructions at home: Relieving discomfort  Gargle with a salt-water mixture 3-4 times a day or as needed. To make a salt-water mixture, completely dissolve -1 tsp of salt in 1 cup of warm water.  If the air in your home is dry, use a humidifier to add moisture to  the air.  Use a saline spray or container (neti pot) to flush out the nose (nasal irrigation). These methods can help clear away mucus and keep the nasal passages moist. General instructions  Take over-the-counter and prescription medicines only as told by your health care provider.  Follow instructions from your health care provider about eating or drinking restrictions. You may need to avoid caffeine.  Avoid things that you know you are allergic to (allergens), like dust, mold, pollen, pets, or certain foods.  Drink enough fluid to keep your urine pale yellow.  Keep all follow-up visits as told by your health care provider. This is important. Contact a health care provider if:  You have a fever.  You have a sore throat.  You have difficulty swallowing.  You have headache.  You have sinus pain.  You have a cough that does not go away.  The mucus from your nose becomes thick and is green or yellow in color.  You   have cold or flu symptoms that last more than 10 days. Summary  Postnasal drip is the feeling of mucus going down the back of your throat.  If your health care provider approves, use nasal irrigation or a nasal spray 2?4 times a day.  Avoid things that you know you are allergic to (allergens), like dust, mold, pollen, pets, or certain foods. This information is not intended to replace advice given to you by your health care provider. Make sure you discuss any questions you have with your health care provider. Document Released: 06/15/2016 Document Revised: 06/15/2016 Document Reviewed: 06/15/2016 Elsevier Interactive Patient Education  2018 Elsevier Inc.  

## 2017-06-08 ENCOUNTER — Other Ambulatory Visit: Payer: Self-pay | Admitting: Internal Medicine

## 2017-06-08 DIAGNOSIS — Z1231 Encounter for screening mammogram for malignant neoplasm of breast: Secondary | ICD-10-CM

## 2017-06-09 ENCOUNTER — Ambulatory Visit: Payer: Medicare Other | Admitting: Pulmonary Disease

## 2017-06-09 ENCOUNTER — Ambulatory Visit (INDEPENDENT_AMBULATORY_CARE_PROVIDER_SITE_OTHER): Payer: Medicare Other | Admitting: *Deleted

## 2017-06-09 ENCOUNTER — Encounter: Payer: Self-pay | Admitting: Pulmonary Disease

## 2017-06-09 ENCOUNTER — Other Ambulatory Visit: Payer: Self-pay | Admitting: Internal Medicine

## 2017-06-09 ENCOUNTER — Other Ambulatory Visit (INDEPENDENT_AMBULATORY_CARE_PROVIDER_SITE_OTHER): Payer: Medicare Other

## 2017-06-09 VITALS — BP 118/70 | HR 68 | Ht 65.5 in | Wt 199.0 lb

## 2017-06-09 DIAGNOSIS — J84112 Idiopathic pulmonary fibrosis: Secondary | ICD-10-CM

## 2017-06-09 DIAGNOSIS — G4733 Obstructive sleep apnea (adult) (pediatric): Secondary | ICD-10-CM

## 2017-06-09 DIAGNOSIS — Z5181 Encounter for therapeutic drug level monitoring: Secondary | ICD-10-CM | POA: Diagnosis not present

## 2017-06-09 DIAGNOSIS — J849 Interstitial pulmonary disease, unspecified: Secondary | ICD-10-CM | POA: Diagnosis not present

## 2017-06-09 LAB — HEPATIC FUNCTION PANEL
ALBUMIN: 3.9 g/dL (ref 3.5–5.2)
ALK PHOS: 48 U/L (ref 39–117)
ALT: 16 U/L (ref 0–35)
AST: 15 U/L (ref 0–37)
Bilirubin, Direct: 0.1 mg/dL (ref 0.0–0.3)
TOTAL PROTEIN: 7.8 g/dL (ref 6.0–8.3)
Total Bilirubin: 0.3 mg/dL (ref 0.2–1.2)

## 2017-06-09 NOTE — Patient Instructions (Signed)
IPF: Keep taking Esbriet as you are doing Keep the appointment for the lung function test next week We will check a liver function test today to monitor for toxicity from that medicine Follow-up with me in 3 months or sooner if needed  Recent upper respiratory infection: Continue taking the medicines as prescribed by your primary care physician Exline Obstructive sleep apnea: Keep taking CPAP nightly no matter how you feel  We will see you back in 3 months or sooner if needed

## 2017-06-09 NOTE — Progress Notes (Signed)
Subjective:    Patient ID: Miranda [name], female    DOB: 04-26-1945, 72 y.o.   MRN: [id number]  Synopsis: Former patient of Dr. Gwenette [name] who was hospitalized in January 2017 with pneumonia and had findings on her CT chest which were worrisome for pulmonary fibrosis.In February 2018 she had a CT angiogram of her chest diagnosing a pulmonary embolism. This is felt to be idiopathic. A follow-up CT scan of the chest in March 2018 showed usual interstitial pneumonitis.  Serologic blood work showed no evidence of a connective tissue disease.    HPI Chief Complaint  Patient presents with  . Follow-up    Has a URI went to the doctor on 06/07/17,    Miranda [name] says that she was recently diagnosed with a respiratory infection a few days ago.  She says that she just started taking her medicine on Tuesday.  She is feeling a little bit better.  No recent problems with shortness of breath.  She does have a cough which she attributes to her respiratory infection  She has been using her CPAP machine nightly.   She is still taking Esbriet regularly.    Past Medical History:  Diagnosis Date  . Anxiety   . Arthritis   . Asthma   . CHF (congestive heart failure) ([place])    pt. unsure- but thinks she was hosp. for CHF- 2002  . [facility] disease    [facility]  . Colon polyps   . Complication of anesthesia    states requires a lot med. to put her to sleep   . DDD (degenerative disc disease) 09/17/2011  . Depression    "sometimes "  . DM (diabetes mellitus) ([place])   . Family history of anesthesia complication   . GERD (gastroesophageal reflux disease)   . Glaucoma    bilateral, pt. admits that she is noncompliant to eye gtts.   . Hemorrhoids   . Hyperlipidemia   . Hypertension    had stress, echo- 2006 /w [facility], Cardiac Cath, per pt. 2002, echo repeated 2012- wnl   . Low back pain   . Shortness of breath   . Sleep apnea    uses c-pap- q night recently      Review of Systems    Constitutional: Positive for fatigue. Negative for chills and fever.  HENT: Negative for postnasal drip, rhinorrhea and sinus pain.   Respiratory: Positive for shortness of breath. Negative for cough and wheezing.   Cardiovascular: Negative for chest pain, palpitations and leg swelling.       Objective:   Physical Exam Vitals:   06/09/17 1049  BP: 118/70  Pulse: 68  SpO2: 100%  Weight: 199 lb (90.3 kg)  Height: 5' 5.5" (1.664 m)   RA  Gen: well appearing HENT: OP clear, TM's clear, neck supple PULM: Crackles bases B, normal percussion CV: RRR, no mgr, trace edema GI: BS+, soft, nontender Derm: no cyanosis or rash Psyche: normal mood and affect    Sleep study: 10/2016 PSG> AHI 10.5   Labs: March 2018 LFT normal, aldolase normal, CRP slightly low, sedimentation rate slightly elevated 58, hypersensitivity pneumonitis panel negative, ANA negative, anti-Jo 1 negative, rheumatoid factor negative, SSA negative, SSB negative, SCL 70 negative  PFT Pulmonary function testing March 2017 showed a ratio of 84%, FVC 1.96 L (77% predicted), total lung capacity 3.85 L (74% predicted), DLCO 14.32 (55% predicted) March 2018 pulmonary function test ratio normal, FVC 2.25 L 90% predicted, total lung capacity  3.54 L 67% predicted, residual volume 1.26 L 56% predicted, DLCO 14.26 55% predicted September 2018 ratio normal, FVC 2.05 L 82% predicted, total lung capacity 3.87 L 74% predicted, DLCO 15.38 60%   6-minute walk: September 2018 348 m, O2 saturation nadir 93% March 2019 336 m O2 saturation nadir 99%  Imaging: February 2018: Images independently reviewed, compared to April 2017 there is no significant change in the peripheral and basilar predominant honeycomb change, traction bronchiectasis with significant patches of normal pulmonary parenchyma. Consistent with usual interstitial pneumonitis.     Assessment & Plan:  Therapeutic drug monitoring - Plan: Hepatic function panel  OSA  (obstructive sleep apnea)  IPF (idiopathic pulmonary fibrosis) (HCC)  Discussion: Aside from the current upper respiratory infection this is been a stable interval for her.  She has stable findings on today's 6-minute walk which is a good thing.  We need to get lung function testing on her but I think it is okay to wait until after the cold.  She will have this done next week.  We need to monitor for liver toxicity by blood work today.  She continues to be compliant with Esbriet.  In general though IPF is an incurable condition I am pleased with relatively slow progression.  I think this is attributable to [place].  Plan: IPF: Keep taking Esbriet as you are doing Keep the appointment for the lung function test next week We will check a liver function test today to monitor for toxicity from that medicine Follow-up with me in 3 months or sooner if needed  Recent upper respiratory infection: Continue taking the medicines as prescribed by your primary care physician Exline Obstructive sleep apnea: Keep taking CPAP nightly no matter how you feel  We will see you back in 3 months or sooner if needed   Current Outpatient Medications:  .  albuterol (PROVENTIL HFA;VENTOLIN HFA) 108 (90 Base) MCG/ACT inhaler, Inhale 1-2 puffs into the lungs every 6 (six) hours as needed for wheezing or shortness of breath., Disp: , Rfl:  .  ALPRAZolam (XANAX) 0.25 MG tablet, Take 1 tablet (0.25 mg total) by mouth 3 (three) times daily as needed for anxiety or sleep., Disp: 30 tablet, Rfl: 5 .  amLODipine (NORVASC) 10 MG tablet, Take 1 tablet (10 mg total) by mouth daily., Disp: 90 tablet, Rfl: 1 .  atorvastatin (LIPITOR) 20 MG tablet, Take 1 tablet (20 mg total) by mouth daily., Disp: 90 tablet, Rfl: 3 .  benzonatate (TESSALON) 100 MG capsule, Take 1 capsule (100 mg total) by mouth 2 (two) times daily as needed for cough., Disp: 20 capsule, Rfl: 0 .  cyclobenzaprine (FLEXERIL) 10 MG tablet, Take 1 tablet (10 mg  total) by mouth 3 (three) times daily as needed., Disp: 90 tablet, Rfl: 1 .  escitalopram (LEXAPRO) 5 MG tablet, Take 1 tablet (5 mg total) by mouth daily., Disp: 30 tablet, Rfl: 0 .  fluticasone (FLONASE) 50 MCG/ACT nasal spray, Place 1 spray into both nostrils daily., Disp: 16 g, Rfl: 0 .  furosemide (LASIX) 40 MG tablet, Take 0.5 tablets (20 mg total) by mouth daily., Disp: 30 tablet, Rfl: 0 .  furosemide (LASIX) 40 MG tablet, TAKE 1/2 (ONE-HALF) TABLET BY MOUTH ONCE DAILY, Disp: 30 tablet, Rfl: 0 .  HYDROcodone-acetaminophen (NORCO) 10-325 MG tablet, Take 1 tablet by mouth every 6 (six) hours as needed., Disp: 90 tablet, Rfl: 0 .  lactulose (CONSTULOSE) 10 GM/15ML solution, TAKE 30 MLS BY MOUTH 3 TIMES DAILY AS NEEDED FOR  MILD CONSTIPATION, Disp: 473 mL, Rfl: 1 .  losartan (COZAAR) 100 MG tablet, Take 1 tablet (100 mg total) by mouth daily., Disp: 90 tablet, Rfl: 3 .  metFORMIN (GLUCOPHAGE-XR) 500 MG 24 hr tablet, Take 2 tablets (1,000 mg total) by mouth daily with breakfast., Disp: 180 tablet, Rfl: 3 .  metoprolol tartrate (LOPRESSOR) 25 MG tablet, Take 1 tablet (25 mg total) by mouth 2 (two) times daily., Disp: 180 tablet, Rfl: 6 .  Pirfenidone (ESBRIET) 801 MG TABS, Take 1 tablet by mouth 3 (three) times daily., Disp: 90 tablet, Rfl: 11 .  predniSONE (DELTASONE) 20 MG tablet, Take 2 tablets (40 mg total) by mouth daily with breakfast for 3 days., Disp: 6 tablet, Rfl: 0 .  rivaroxaban (XARELTO) 20 MG TABS tablet, Take 1 tablet (20 mg total) by mouth daily with supper., Disp: 30 tablet, Rfl: 5

## 2017-06-09 NOTE — Progress Notes (Signed)
SIX MIN WALK 06/09/2017 12/07/2016  Medications amlodipine 10mg , flonase 50mcg, furosemide 40mg , losartan 100mg , metformin 500mg , metoprolol 25mg , Esbriet 801mg , prednisone 20mg  all taken around 9:30 Lasix 40 mg, Cozaar 50mg , Lovastatin 40mg , Metoprolol 25mg , Pirfenidone 801 mg all at 9:30, Xarelto 20mg   10:00am  Supplimental Oxygen during Test? (L/min) No No  Laps 7 7  Partial Lap (in Meters) 0 12  Baseline BP (sitting) 120/62 130/64  Baseline Heartrate 52 58  Baseline Dyspnea (Borg Scale) 2 1  Baseline Fatigue (Borg Scale) 2 4  Baseline SPO2 100 100  BP (sitting) 140/64 140/60  Heartrate 68 65  Dyspnea (Borg Scale) 4 7  Fatigue (Borg Scale) 3 7  SPO2 99 93  BP (sitting) 130/64 126/62  Heartrate 58 53  SPO2 100 98  Stopped or Paused before Six Minutes No No  Interpretation Leg pain -  Distance Completed 336 348  Tech Comments: Pt walked at a normal pace without any stopping and did not state any complaints or concerns during walk. Pt caomplained of beinf really fatigued during the walk TA/CMA

## 2017-06-11 ENCOUNTER — Encounter: Payer: Self-pay | Admitting: Gastroenterology

## 2017-06-14 ENCOUNTER — Ambulatory Visit: Payer: Medicare Other | Admitting: Pulmonary Disease

## 2017-06-14 DIAGNOSIS — J432 Centrilobular emphysema: Secondary | ICD-10-CM

## 2017-06-14 LAB — PULMONARY FUNCTION TEST
DL/VA % PRED: 85 %
DL/VA: 4.19 ml/min/mmHg/L
DLCO UNC % PRED: 57 %
DLCO UNC: 14.69 ml/min/mmHg
FEF 25-75 POST: 2.69 L/s
FEF 25-75 PRE: 2.33 L/s
FEF2575-%Change-Post: 15 %
FEF2575-%PRED-PRE: 134 %
FEF2575-%Pred-Post: 154 %
FEV1-%Change-Post: 4 %
FEV1-%Pred-Post: 98 %
FEV1-%Pred-Pre: 94 %
FEV1-PRE: 1.81 L
FEV1-Post: 1.88 L
FEV1FVC-%Change-Post: 1 %
FEV1FVC-%PRED-PRE: 108 %
FEV6-%CHANGE-POST: 2 %
FEV6-%Pred-Post: 93 %
FEV6-%Pred-Pre: 91 %
FEV6-POST: 2.2 L
FEV6-Pre: 2.15 L
FEV6FVC-%Change-Post: 0 %
FEV6FVC-%PRED-POST: 103 %
FEV6FVC-%Pred-Pre: 103 %
FVC-%Change-Post: 2 %
FVC-%PRED-PRE: 87 %
FVC-%Pred-Post: 89 %
FVC-POST: 2.21 L
FVC-PRE: 2.16 L
PRE FEV1/FVC RATIO: 84 %
Post FEV1/FVC ratio: 85 %
Post FEV6/FVC ratio: 100 %
Pre FEV6/FVC Ratio: 100 %
RV % pred: 67 %
RV: 1.53 L
TLC % PRED: 70 %
TLC: 3.68 L

## 2017-06-14 NOTE — Progress Notes (Signed)
PFT done today. 

## 2017-06-23 DIAGNOSIS — M5416 Radiculopathy, lumbar region: Secondary | ICD-10-CM | POA: Diagnosis not present

## 2017-06-28 DIAGNOSIS — G4733 Obstructive sleep apnea (adult) (pediatric): Secondary | ICD-10-CM | POA: Diagnosis not present

## 2017-07-06 DIAGNOSIS — M5136 Other intervertebral disc degeneration, lumbar region: Secondary | ICD-10-CM | POA: Diagnosis not present

## 2017-07-07 ENCOUNTER — Telehealth: Payer: Self-pay | Admitting: Internal Medicine

## 2017-07-07 NOTE — Telephone Encounter (Signed)
Copied from [place] [id number]. Topic: Quick Communication - Rx Refill/Question >> Jul 07, 2017 10:26 AM Selinda Flavin B, NT wrote: Medication: HYDROcodone-acetaminophen (NORCO) 10-325 MG tablet Has the patient contacted their pharmacy? Yes.   (Agent: If no, request that the patient contact the pharmacy for the refill.) Preferred Pharmacy (with phone number or street name): [facility] ([facility]), [place] Agent: Please be advised that RX refills may take up to 3 business days. We ask that you follow-up with your pharmacy.  Patient states that she usually has to come to the office to pick this up. Wanted to pick this up today since she is going to be near the office. States she can come do a UDS if needed. Please advise

## 2017-07-08 ENCOUNTER — Ambulatory Visit
Admission: RE | Admit: 2017-07-08 | Discharge: 2017-07-08 | Disposition: A | Discharge status: Home / Self Care | Payer: Medicare Other | Source: Ambulatory Visit | Attending: Internal Medicine | Admitting: Internal Medicine

## 2017-07-08 DIAGNOSIS — Z1231 Encounter for screening mammogram for malignant neoplasm of breast: Secondary | ICD-10-CM | POA: Diagnosis not present

## 2017-07-14 ENCOUNTER — Ambulatory Visit (INDEPENDENT_AMBULATORY_CARE_PROVIDER_SITE_OTHER): Payer: Medicare Other | Admitting: Internal Medicine

## 2017-07-14 ENCOUNTER — Telehealth: Payer: Self-pay | Admitting: Internal Medicine

## 2017-07-14 ENCOUNTER — Encounter: Payer: Self-pay | Admitting: Internal Medicine

## 2017-07-14 VITALS — BP 122/62 | HR 52 | Temp 97.5°F | Wt 198.0 lb

## 2017-07-14 DIAGNOSIS — Z5181 Encounter for therapeutic drug level monitoring: Secondary | ICD-10-CM | POA: Diagnosis not present

## 2017-07-14 DIAGNOSIS — M545 Low back pain: Secondary | ICD-10-CM | POA: Diagnosis not present

## 2017-07-14 DIAGNOSIS — I10 Essential (primary) hypertension: Secondary | ICD-10-CM | POA: Diagnosis not present

## 2017-07-14 DIAGNOSIS — M150 Primary generalized (osteo)arthritis: Secondary | ICD-10-CM | POA: Diagnosis not present

## 2017-07-14 DIAGNOSIS — E119 Type 2 diabetes mellitus without complications: Secondary | ICD-10-CM | POA: Diagnosis not present

## 2017-07-14 DIAGNOSIS — M159 Polyosteoarthritis, unspecified: Secondary | ICD-10-CM

## 2017-07-14 DIAGNOSIS — G8929 Other chronic pain: Secondary | ICD-10-CM

## 2017-07-14 LAB — POCT GLYCOSYLATED HEMOGLOBIN (HGB A1C): HEMOGLOBIN A1C: 5.8

## 2017-07-14 MED ORDER — HYDROCODONE-ACETAMINOPHEN 10-325 MG PO TABS: 1.0000 | ORAL_TABLET | Freq: Four times a day (QID) | ORAL | 0 refills | Status: DC | PRN

## 2017-07-14 NOTE — Telephone Encounter (Signed)
Copied from [place] [id number]. Topic: Quick Communication - Rx Refill/Question >> Jul 14, 2017  3:32 PM Robina Ade, Helene Kelp D wrote: Medication:  HYDROcodone-acetaminophen ([place]) 10-325 MG tablet  Has the patient contacted their pharmacy? Yes, pharmacy just needs diagnosis code of why pt is getting medication. (Agent: If no, request that the patient contact the pharmacy for the refill.) Preferred Pharmacy (with phone number or street name):[facility] ([facility]), [place] [address] Agent: Please be advised that RX refills may take up to 3 business days. We ask that you follow-up with your pharmacy.

## 2017-07-14 NOTE — Patient Instructions (Signed)
Limit your sodium (Salt) intake   Please check your hemoglobin A1c every 3-6  Months  Please check your blood pressure on a regular basis.  If it is consistently greater than 150/90, please make an office appointment.'  Pulmonary follow-up as scheduled  Return here in 3 months for follow-up

## 2017-07-14 NOTE — Progress Notes (Signed)
   Subjective:    Patient ID: Miranda [name], female    DOB: 12-17-1945, 72 y.o.   MRN: [id number]  HPI  Lab Results  Component Value Date   HGBA1C 5.9 12/07/2016    Review of Systems     Objective:   Physical Exam        Assessment & Plan:

## 2017-07-14 NOTE — Telephone Encounter (Addendum)
IPF PRO Registry Purpose: To collect data and biological samples that will support future research studies.  Registry will describe current approaches to diagnosis and treatment of IPF, analyze participant characteristics to describe the natural history of the disease, assess quality of life, describe participants interactions with the health care system, describe IPF treatment practices across multiple institutions, and utilize biological samples linked to well characterized IPF participants to identify disease biomarkers.    Clinical Research Coordinator note: This visit for Subject Miranda [name] with DOB: 1946/02/09 on 07/14/2017 for the above protocol is Visit/Encounter # 6 month follow up  and is for purpose of Research . The consent for this encounter is under Protocol Version [id number] and  is currently IRB approved.  In this research encounter that occurred 30APR2019, the subject had their 6 month IPF PRO registry trial follow up. Study procedure performed included abstracting all records pertaining to the subjects diagnosis as required by the above mentioned protocol. The patient is not under any obligation to be present for this encounter as stated in the protocol. Since patient was not present Blood collection and patient reported outcomes questionnaire was not completed. All data collected was entered into [facility] and paper source verification will be filed in [employer] paper source binder. For further information pertaining to this encounter refer to Subject paper source binder. This coordinator plans to contact the subject to schedule a visit for blood draw and collect patient reported outcomes questionnaire.    Signed by  T. Early Chars, BS, [place], [place] 8:55 AM 07/14/2017

## 2017-07-14 NOTE — Progress Notes (Signed)
Subjective:    Patient ID: Miranda [name], female    DOB: [birth year], 72 y.o.   MRN: [id number]  HPI 73 year old patient who is seen today in follow-up.  She has been seen by Dr. Dossie Der for low back pain. She is seen for follow-up of hypertension and type 2 diabetes.  Last hemoglobin A1c was less than 6. She continues to do well except for low back pain secondary to degenerative disc disease.  Her pulmonary status has been stable.  Indication for chronic opioid: Chronic low back pain secondary to degenerative disc disease Medication and dose: Hydrocodone acetaminophen 5 mg every 6 hours as needed for low back pain # pills per month: 90 maximum Last UDS date: April 08, 2017 Opioid Treatment Agreement signed (Y/N): Yes Opioid Treatment Agreement last reviewed with patient:  April 08, 2017 [place] reviewed this encounter (include red flags):  Yes.  Today  Past Medical History:  Diagnosis Date  . Anxiety   . Arthritis   . Asthma   . CHF (congestive heart failure) ([place])    pt. unsure- but thinks she was hosp. for CHF- 2002  . [facility]  . Colon polyps   . Complication of anesthesia    states requires a lot med. to put her to sleep   . DDD (degenerative disc disease) 09/17/2011  . Depression    "sometimes "  . DM (diabetes mellitus) ([place])   . Family history of anesthesia complication   . GERD (gastroesophageal reflux disease)   . Glaucoma    bilateral, pt. admits that she is noncompliant to eye gtts.   . Hemorrhoids   . Hyperlipidemia   . Hypertension    had stress, echo- 2006 /w [place], Cardiac Cath, per pt. 2002, echo repeated 2012- wnl   . Low back pain   . Shortness of breath   . Sleep apnea    uses c-pap- q night recently     Social History   Socioeconomic History  . Marital status: Married    Spouse name: Not on file  . Number of children: 4  . Years of education: Not on file  . Highest education level: Not on file    Occupational History  . Occupation: retired Forensic psychologist: UNEMPLOYED  Social Needs  . Financial resource strain: Not on file  . Food insecurity:    Worry: Not on file    Inability: Not on file  . Transportation needs:    Medical: Not on file    Non-medical: Not on file  Tobacco Use  . Smoking status: Never Smoker  . Smokeless tobacco: Never Used  Substance and Sexual Activity  . Alcohol use: No    Alcohol/week: 0.0 oz  . Drug use: No  . Sexual activity: Never  Lifestyle  . Physical activity:    Days per week: Not on file    Minutes per session: Not on file  . Stress: Not on file  Relationships  . Social connections:    Talks on phone: Not on file    Gets together: Not on file    Attends religious service: Not on file    Active member of club or organization: Not on file    Attends meetings of clubs or organizations: Not on file    Relationship status: Not on file  . Intimate partner violence:    Fear of current or ex partner: Not on file    Emotionally  abused: Not on file    Physically abused: Not on file    Forced sexual activity: Not on file  Other Topics Concern  . Not on file  Social History Narrative  . Not on file    Past Surgical History:  Procedure Laterality Date  . ABDOMINAL HYSTERECTOMY     ectopic, fibroids  . CARDIAC CATHETERIZATION    . CATARACT EXTRACTION, BILATERAL     cataracts removed bilateral- ?IOL  . COLONOSCOPY     remote  . FISSURECTOMY  10/08/2011   Procedure: FISSURECTOMY;  Surgeon: Stark Klein, MD;  Location: [facility];  Service: General;  Laterality: N/A;  . FLEXIBLE SIGMOIDOSCOPY  02/25/2011   Procedure: FLEXIBLE SIGMOIDOSCOPY;  Surgeon: Inda Castle, MD;  [facility];  Service: Endoscopy;  Laterality: N/A;  . FOOT SURGERY     bilat, heel spurs- screw in R foot   . HEMORRHOID SURGERY  10/08/2011   Procedure: HEMORRHOIDECTOMY;  Surgeon: Stark Klein, MD;  Location: [facility];  Service: General;  Laterality: N/A;   External   . SPHINCTEROTOMY  10/08/2011   Procedure: Joan Mayans;  Surgeon: Stark Klein, MD;  [facility];  Service: General;  Laterality: N/A;    Family History  Problem Relation Age of Onset  . Heart attack Mother   . Heart disease Mother   . Breast cancer Mother   . Emphysema Sister   . Breast cancer Sister   . Asthma Sister   . Lung cancer Sister   . COPD Sister   . Heart disease Sister   . Rheum arthritis Sister   . Colon cancer Brother   . Lung cancer Sister   . Anesthesia problems Neg Hx     Allergies  Allergen Reactions  . Fish Oil Anaphylaxis  . Other Hives, Shortness Of Breath and Swelling    Allergic to cashew nuts and peanut oil.  Marland Kitchen Penicillins Anaphylaxis, Hives and Swelling    Has patient had a PCN reaction causing immediate rash, facial/tongue/throat swelling, SOB or lightheadedness with hypotension: Face swelling and hives started first, then swelling of the throat  Has patient had a PCN reaction causing severe rash involving mucus membranes or skin necrosis: Yes  Has patient had a PCN reaction that required hospitalization: No  Has patient had a PCN reaction occurring within the last 10 years: Yes  If all of the above answers are "NO", then may proceed with Cephalospor  . Pneumococcal Vaccines Nausea And Vomiting  . Nitrofurantoin Macrocrystal   . Aspirin Itching and Rash  . Bactrim [Sulfamethoxazole-Trimethoprim] Hives, Itching and Rash  . Ciprofloxacin Hives, Itching and Rash  . Ibuprofen Rash  . Influenza Vaccines Hives  . Ivp Dye [Iodinated Diagnostic Agents] Hives, Itching and Rash    Gives benadryl to counteract symptoms  . Latex Rash  . Macrobid [Nitrofurantoin Monohydrate Macrocrystals] Hives  . Shellfish Allergy Hives    Patient also allergic to seafood    Current Outpatient Medications on File Prior to Visit  Medication Sig Dispense Refill  . albuterol (PROVENTIL HFA;VENTOLIN HFA) 108 (90 Base) MCG/ACT inhaler Inhale 1-2 puffs into  the lungs every 6 (six) hours as needed for wheezing or shortness of breath.    . ALPRAZolam (XANAX) 0.25 MG tablet Take 1 tablet (0.25 mg total) by mouth 3 (three) times daily as needed for anxiety or sleep. 30 tablet 5  . amLODipine (NORVASC) 10 MG tablet Take 1 tablet (10 mg total) by mouth daily. 90 tablet 1  . amLODipine (  NORVASC) 10 MG tablet TAKE 1 TABLET BY MOUTH ONCE DAILY 90 tablet 1  . atorvastatin (LIPITOR) 20 MG tablet Take 1 tablet (20 mg total) by mouth daily. 90 tablet 3  . benzonatate (TESSALON) 100 MG capsule Take 1 capsule (100 mg total) by mouth 2 (two) times daily as needed for cough. 20 capsule 0  . cyclobenzaprine (FLEXERIL) 10 MG tablet Take 1 tablet (10 mg total) by mouth 3 (three) times daily as needed. 90 tablet 1  . escitalopram (LEXAPRO) 5 MG tablet Take 1 tablet (5 mg total) by mouth daily. 30 tablet 0  . fluticasone (FLONASE) 50 MCG/ACT nasal spray Place 1 spray into both nostrils daily. 16 g 0  . furosemide (LASIX) 40 MG tablet Take 0.5 tablets (20 mg total) by mouth daily. 30 tablet 0  . furosemide (LASIX) 40 MG tablet TAKE 1/2 (ONE-HALF) TABLET BY MOUTH ONCE DAILY 30 tablet 0  . HYDROcodone-acetaminophen (NORCO) 10-325 MG tablet Take 1 tablet by mouth every 6 (six) hours as needed. 90 tablet 0  . lactulose (CONSTULOSE) 10 GM/15ML solution TAKE 30 MLS BY MOUTH 3 TIMES DAILY AS NEEDED FOR MILD CONSTIPATION 473 mL 1  . losartan (COZAAR) 100 MG tablet Take 1 tablet (100 mg total) by mouth daily. 90 tablet 3  . metFORMIN (GLUCOPHAGE-XR) 500 MG 24 hr tablet Take 2 tablets (1,000 mg total) by mouth daily with breakfast. 180 tablet 3  . metoprolol tartrate (LOPRESSOR) 25 MG tablet Take 1 tablet (25 mg total) by mouth 2 (two) times daily. 180 tablet 6  . Pirfenidone (ESBRIET) 801 MG TABS Take 1 tablet by mouth 3 (three) times daily. 90 tablet 11  . rivaroxaban (XARELTO) 20 MG TABS tablet Take 1 tablet (20 mg total) by mouth daily with supper. 30 tablet 5   No current  facility-administered medications on file prior to visit.     BP 122/62 (BP Location: [name] Arm, Patient Position: Sitting, Cuff Size: Large)   Pulse (!) 52   Temp (!) 97.5 F (36.4 C) (Oral)   Wt 198 lb (89.8 kg)   SpO2 99%   BMI 32.45 kg/m      Review of Systems  Constitutional: Negative.   HENT: Negative for congestion, dental problem, hearing loss, rhinorrhea, sinus pressure, sore throat and tinnitus.   Eyes: Negative for pain, discharge and visual disturbance.  Respiratory: Negative for cough and shortness of breath.   Cardiovascular: Negative for chest pain, palpitations and leg swelling.  Gastrointestinal: Negative for abdominal distention, abdominal pain, blood in stool, constipation, diarrhea, nausea and vomiting.  Genitourinary: Negative for difficulty urinating, dysuria, flank pain, frequency, hematuria, pelvic pain, urgency, vaginal bleeding, vaginal discharge and vaginal pain.  Musculoskeletal: Positive for arthralgias and back pain. Negative for gait problem and joint swelling.  Skin: Negative for rash.  Neurological: Negative for dizziness, syncope, speech difficulty, weakness, numbness and headaches.  Hematological: Negative for adenopathy.  Psychiatric/Behavioral: Negative for agitation, behavioral problems and dysphoric mood. The patient is not nervous/anxious.        Objective:   Physical Exam  Constitutional: She is oriented to person, place, and time. She appears well-developed and well-nourished.  Blood pressure well controlled  HENT:  Head: Normocephalic.  [name] Ear: External ear normal.  Left Ear: External ear normal.  Mouth/Throat: Oropharynx is clear and moist.  Eyes: Pupils are equal, round, and reactive to light. Conjunctivae and EOM are normal.  Neck: Normal range of motion. Neck supple. No thyromegaly present.  Cardiovascular: Normal rate, regular rhythm,  normal heart sounds and intact distal pulses.  Pulmonary/Chest: Effort normal and breath  sounds normal.  Chest essentially clear.  No significant bibasilar rales  Abdominal: Soft. Bowel sounds are normal. She exhibits no mass. There is no tenderness.  Musculoskeletal: Normal range of motion.  Lymphadenopathy:    She has no cervical adenopathy.  Neurological: She is alert and oriented to person, place, and time.  Skin: Skin is warm and dry. No rash noted.  Psychiatric: She has a normal mood and affect. Her behavior is normal.          Assessment & Plan:   Encounter for chronic pain management (G89.29) Narcotic use  (711.90) Pain management contract signed (Z02.89)   Dabetes mellitus.  Will check a hemoglobin A1c   Essential hypertension.  Well-controlled  History of interstitial lung disease   Follow-up 3 months  Jawon Dipiero Pilar Plate

## 2017-07-15 NOTE — Telephone Encounter (Signed)
Provided codes for pharmacist. No further action needed.

## 2017-07-16 ENCOUNTER — Other Ambulatory Visit: Payer: Self-pay | Admitting: Internal Medicine

## 2017-07-16 NOTE — Telephone Encounter (Signed)
PI OVERSIGHT ATTESTATION  I the Principal Investigator (PI) for the above mentioned study attest that I reviewed the above mentioned  clinical research coordinator notes on research subject  Miranda [name]  born May 25, 1945 . I  agree with the findings mentioned above   Dr. Brand Males, M.D., F.C.C.P., ACRP-CPI Pulmonary and Critical Care Medicine Principal Investigator & Staff Physician [facility] Pager: [phone], If no answer or between  15:00h - 7:00h: call [phone]  07/16/2017 2:53 PM

## 2017-07-28 DIAGNOSIS — G4733 Obstructive sleep apnea (adult) (pediatric): Secondary | ICD-10-CM | POA: Diagnosis not present

## 2017-07-29 ENCOUNTER — Ambulatory Visit: Payer: Medicare Other | Admitting: Gastroenterology

## 2017-07-29 ENCOUNTER — Encounter: Payer: Self-pay | Admitting: Gastroenterology

## 2017-07-29 VITALS — BP 146/80 | HR 76 | Ht 64.25 in | Wt 198.2 lb

## 2017-07-29 DIAGNOSIS — K5909 Other constipation: Secondary | ICD-10-CM

## 2017-07-29 DIAGNOSIS — K602 Anal fissure, unspecified: Secondary | ICD-10-CM | POA: Diagnosis not present

## 2017-07-29 DIAGNOSIS — Z86010 Personal history of colonic polyps: Secondary | ICD-10-CM

## 2017-07-29 MED ORDER — AMBULATORY NON FORMULARY MEDICATION: 0 refills | Status: DC

## 2017-07-29 NOTE — Progress Notes (Signed)
[facility] Consult Note:  History: Miranda [name] 07/29/2017  Referring physician: Marletta Lor, MD  Reason for consult/chief complaint: history colon polyps (discuss colonoscopy- was recently taken off xarelto (2 months ago); no GI concerns at this time (constipation is helped with lactulose))   Subjective  HPI:  This is a 72 year old woman I saw in September 2018 for history of colon polyps, having previously been seen by Dr. Deatra Ina in 2012.  The patient had been diagnosed with a pulmonary embolism incidentally on a CT angiogram from February 2018 and was then on oral anticoagulation.  She also had pulmonary fibrosis seen on that CT scan, but had good respiratory status and did not require supplemental oxygen.  I felt it would be best to wait on surveillance colonoscopy until she could be completely off [place] if possible. I reviewed her most recent pulmonary clinic note by Dr.McQuaid from 06/09/17, reporting stable respiratory status, and patient still on Xarelto at that time. She has since discontinued Xarelto.  Miranda [name] began describing her many years of constipation with frequent straining in both lower abdominal and anorectal pain with bowel movements. She did not get improvement from MiraLAX, and has been on lactulose for many years, taking it perhaps twice a week. She often strains for bowel movements pain afterwards. I find a report from Dr. Deatra Ina who did a sigmoidoscopy with Botox ingection of anan anal fissure in December 2012.  ROS:  Review of Systems  Constitutional: Negative for appetite change and unexpected weight change.  HENT: Negative for mouth sores and voice change.   Eyes: Negative for pain and redness.  Respiratory: Negative for cough and shortness of breath.   Cardiovascular: Negative for chest pain and palpitations.  Genitourinary: Negative for dysuria and hematuria.  Musculoskeletal: Positive for back pain.  Skin: Negative for  pallor and rash.  Neurological: Negative for weakness and headaches.  Hematological: Negative for adenopathy.     Past Medical History: Past Medical History:  Diagnosis Date  . Anxiety   . Arthritis   . Asthma   . CHF (congestive heart failure) ([place])    pt. unsure- but thinks she was hosp. for CHF- 2002  . [facility] kidney disease    [facility]  . Colon polyps   . Complication of anesthesia    states requires a lot med. to put her to sleep   . DDD (degenerative disc disease) 09/17/2011  . Depression    "sometimes "  . DM (diabetes mellitus) ([place])   . Family history of anesthesia complication   . GERD (gastroesophageal reflux disease)   . Glaucoma    bilateral, pt. admits that she is noncompliant to eye gtts.   . Hemorrhoids   . Hyperlipidemia   . Hypertension    had stress, echo- 2006 /w [place], Cardiac Cath, per pt. 2002, echo repeated 2012- wnl   . Low back pain   . Shortness of breath   . Sleep apnea    uses c-pap- q night recently     Past Surgical History: Past Surgical History:  Procedure Laterality Date  . ABDOMINAL HYSTERECTOMY     ectopic, fibroids  . arm surgery Right   . CARDIAC CATHETERIZATION    . CATARACT EXTRACTION, BILATERAL     cataracts removed bilateral- ?IOL  . COLONOSCOPY     remote  . FISSURECTOMY  10/08/2011   Procedure: FISSURECTOMY;  Surgeon: Stark Klein, MD;  Location: [facility];  Service: General;  Laterality: N/A;  .  FLEXIBLE SIGMOIDOSCOPY  02/25/2011   Procedure: FLEXIBLE SIGMOIDOSCOPY;  Surgeon: Inda Castle, MD;  [facility];  Service: Endoscopy;  Laterality: N/A;  . FOOT SURGERY     bilat, heel spurs- screw in R foot   . HEMORRHOID SURGERY  10/08/2011   Procedure: HEMORRHOIDECTOMY;  Surgeon: Stark Klein, MD;  Location: [facility];  Service: General;  Laterality: N/A;  External   . SPHINCTEROTOMY  10/08/2011   Procedure: Joan Mayans;  Surgeon: Stark Klein, MD;  [facility];  Service: General;  Laterality:  N/A;     Family History: Family History  Problem Relation Age of Onset  . Heart attack Mother   . Heart disease Mother   . Breast cancer Mother   . Emphysema Sister   . Breast cancer Sister   . Arthritis/Rheumatoid Sister   . Asthma Sister   . Lung cancer Sister   . COPD Sister   . Colon cancer Brother   . Colon cancer Brother   . Anesthesia problems Neg Hx     Social History: Social History   Socioeconomic History  . Marital status: Married    Spouse name: Not on file  . Number of children: 4  . Years of education: Not on file  . Highest education level: Not on file  Occupational History  . Occupation: retired Forensic psychologist: UNEMPLOYED  Social Needs  . Financial resource strain: Not on file  . Food insecurity:    Worry: Not on file    Inability: Not on file  . Transportation needs:    Medical: Not on file    Non-medical: Not on file  Tobacco Use  . Smoking status: Never Smoker  . Smokeless tobacco: Never Used  Substance and Sexual Activity  . Alcohol use: No    Alcohol/week: 0.0 oz  . Drug use: No  . Sexual activity: Never  Lifestyle  . Physical activity:    Days per week: Not on file    Minutes per session: Not on file  . Stress: Not on file  Relationships  . Social connections:    Talks on phone: Not on file    Gets together: Not on file    Attends religious service: Not on file    Active member of club or organization: Not on file    Attends meetings of clubs or organizations: Not on file    Relationship status: Not on file  Other Topics Concern  . Not on file  Social History Narrative  . Not on file    Allergies: Allergies  Allergen Reactions  . Fish Oil Anaphylaxis  . Other Hives, Shortness Of Breath and Swelling    Allergic to cashew nuts and peanut oil.  Marland Kitchen Penicillins Anaphylaxis, Hives and Swelling    Has patient had a PCN reaction causing immediate rash, facial/tongue/throat swelling, SOB or lightheadedness with hypotension: Face  swelling and hives started first, then swelling of the throat  Has patient had a PCN reaction causing severe rash involving mucus membranes or skin necrosis: Yes  Has patient had a PCN reaction that required hospitalization: No  Has patient had a PCN reaction occurring within the last 10 years: Yes  If all of the above answers are "NO", then may proceed with Cephalospor  . Pneumococcal Vaccines Nausea And Vomiting  . Nitrofurantoin Macrocrystal   . Aspirin Itching and Rash  . Bactrim [Sulfamethoxazole-Trimethoprim] Hives, Itching and Rash  . Ciprofloxacin Hives, Itching and Rash  . Ibuprofen Rash  .  Influenza Vaccines Hives  . Ivp Dye [Iodinated Diagnostic Agents] Hives, Itching and Rash    Gives benadryl to counteract symptoms  . Latex Rash  . Macrobid [Nitrofurantoin Monohydrate Macrocrystals] Hives  . Shellfish Allergy Hives    Patient also allergic to seafood    Outpatient Meds: Current Outpatient Medications  Medication Sig Dispense Refill  . ALPRAZolam (XANAX) 0.25 MG tablet Take 1 tablet (0.25 mg total) by mouth 3 (three) times daily as needed for anxiety or sleep. 30 tablet 5  . amLODipine (NORVASC) 10 MG tablet Take 1 tablet (10 mg total) by mouth daily. 90 tablet 1  . atorvastatin (LIPITOR) 20 MG tablet Take 1 tablet (20 mg total) by mouth daily. 90 tablet 3  . cyclobenzaprine (FLEXERIL) 10 MG tablet Take 1 tablet (10 mg total) by mouth 3 (three) times daily as needed. 90 tablet 1  . fluticasone (FLONASE) 50 MCG/ACT nasal spray Place 1 spray into both nostrils daily. (Patient taking differently: Place 1 spray into both nostrils daily as needed. ) 16 g 0  . furosemide (LASIX) 40 MG tablet Take 0.5 tablets (20 mg total) by mouth daily. 30 tablet 0  . HYDROcodone-acetaminophen (NORCO) 10-325 MG tablet Take 1 tablet by mouth every 6 (six) hours as needed. 90 tablet 0  . lactulose (CONSTULOSE) 10 GM/15ML solution TAKE 30 MLS BY MOUTH 3 TIMES DAILY AS NEEDED FOR MILD CONSTIPATION  473 mL 1  . losartan (COZAAR) 100 MG tablet Take 1 tablet (100 mg total) by mouth daily. 90 tablet 3  . metFORMIN (GLUCOPHAGE-XR) 500 MG 24 hr tablet Take 2 tablets (1,000 mg total) by mouth daily with breakfast. 180 tablet 3  . metoprolol tartrate (LOPRESSOR) 25 MG tablet Take 1 tablet (25 mg total) by mouth 2 (two) times daily. 180 tablet 6  . Pirfenidone (ESBRIET) 801 MG TABS Take 1 tablet by mouth 3 (three) times daily. 90 tablet 11  . AMBULATORY NON FORMULARY MEDICATION Nitroglycerine ointment 0.125 %  Apply a pea sized amount internally THREE times daily. Dispense 30 GM zero refill 30 g 0   No current facility-administered medications for this visit.       ___________________________________________________________________ Objective   Exam:  BP (!) 146/80   Pulse 76   Ht 5' 4.25" (1.632 m)   Wt 198 lb 3.2 oz (89.9 kg)   BMI 33.76 kg/m    General: this is a(n) well-appearing woman   Eyes: sclera anicteric, no redness  ENT: oral mucosa moist without lesions, no cervical or supraclavicular lymphadenopathy, good dentition  CV: RRR without murmur, S1/S2, no JVD, no peripheral edema  Resp: clear to auscultation bilaterally, normal RR and effort noted  GI: soft, no tenderness, with active bowel sounds. No guarding or palpable organomegaly noted.  Skin; warm and dry, no rash or jaundice noted  Neuro: awake, alert and oriented x 3. Normal gross motor function and fluent speech Rectal: Posterior fissure that was tender with associated anal spasm. No palpable internal lesions.  Labs:   Assessment: Encounter Diagnoses  Name Primary?  Marland Kitchen Anal fissure Yes  . Chronic constipation   . 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 We discussed the nature of this condition and the current treatment.  She is also overdue for surveillance  colonoscopy. I would like that to wait until we have some treatment of the fissure first. Consider future treatment with  Linzess, Amitiza or Movantik.  Plan:  Recticare and nitroglycerin ointment 3 times daily Lactulose daily for soft bowel movements Colonoscopy in 6-8 weeks. She is agreeable after discussion of the procedure and risks.  The benefits and risks of the planned procedure were described in detail with the patient or (when appropriate) their health care proxy.  Risks were outlined as including, but not limited to, bleeding, infection, perforation, adverse medication reaction leading to cardiac or pulmonary decompensation, or pancreatitis (if ERCP).  The limitation of incomplete mucosal visualization was also discussed.  No guarantees or warranties were given.  Total time 30 minutes, over half spent face-to-face in discussion with patient.  Nelida Meuse III  CC: Marletta Lor, MD

## 2017-07-29 NOTE — Patient Instructions (Addendum)
If you are age 71 or older, your body mass index should be between 23-30. Your Body mass index is 33.76 kg/m. If this is out of the aforementioned range listed, please consider follow up with your Primary Care Provider.  If you are age 23 or younger, your body mass index should be between 19-25. Your Body mass index is 33.76 kg/m. If this is out of the aformentioned range listed, please consider follow up with your Primary Care Provider.   You have been scheduled for a colonoscopy. Please follow written instructions given to you at your visit today.  Please pick up your prep supplies at the pharmacy within the next 1-3 days. If you use inhalers (even only as needed), please bring them with you on the day of your procedure. Your physician has requested that you go to www.startemmi.com and enter the access code given to you at your visit today. This web site gives a general overview about your procedure. However, you should still follow specific instructions given to you by our office regarding your preparation for the procedure.    Patient Drug Education for Nitroglycerin Ointment  Nitroglycerin ointment (NTG) is used to help heal anal fissures. The ointment relaxes the smooth muscle around the anus and promotes blood flow which helps heal the fissure (tear). The NTG reduces anal canal pressure, which diminishes pain and spasm. We use a diluted concentration of NTG (.125%) compared to the 2% that is typically used for heart patients, and this is why you need to obtain the medication from a pharmacy which will compound your prescription.  The NTG ointment should be applied 3 times per day, or as directed.  A pea-sized drop should be placed on the tip of your finger and then gently placed inside the anus. The finger should be inserted 1/3 - 1/2 its length and may be covered with a plastic glove or finger cot. You may use Vaseline to help coat the finger or dilute the ointment. If you are advised to  mix the NTG with steroid ointment, limit the steroids to one to two weeks.  The first few applications should be taken lying down, as mild light-headedness or a brief headache may occur.  It may take several weeks for the fissure to begin healing, and you will need to continue taking the medication after resolution of your symptoms.  It is important to continue the treatment for the entire time period - up to 3 months or as directed. It takes up to two years for the healing tissue to regain the normal skin strength. You will be advised to add fiber to your diet, increase water intake to 7-8 glasses per day, take relaxing baths or sitz baths, and avoid prolonged sitting and straining on the commode. Local anesthetic ointment may be added.  Initially, the anal fissure is very inflamed, which allows more of the NTG to get into the blood. This allows for a higher incidence of the most common side effect - a headache. It is usually brief and mild, but may require Tylenol or Advil. You may dilute the NTG further with Vaseline to decrease the headaches. As the treatment progresses and the fissure begins to heal, the headaches will tend to dissipate. Other side effects include lightheadedness, flushing, dizziness, nervousness, nausea, and vomiting. If any of these side effects persist or worsen, notify us promptly. Stop using the NTG and notify us immediately if you develop the rare side effects of severe dizziness, fainting, fast/pounding heartbeat, paleness,  sweating, blurred vision, dry mouth, dark urine, bluish lips/skin/nails, unusual tiredness, severe weakness, irregular heartbeat, seizures, or chest pain. Serious allergic reactions are unusual, but seek immediate medical attention if you develop a rash, swelling, dizziness, or trouble breathing.  Tell us if you are allergic to nitrates, have severe anemia, low blood pressure, dehydration, chronic heart failure, cardiomyopathy, recent heart attack, increased  pressure in the brain, or exposure to nitrates while on the job. Do not use NTG while driving or working around machinery if you are drowsy, dizzy, have lightheadedness, or blurred vision. Limit alcoholic beverages. To minimize dizziness and lightheadedness, get up slowly when rising from a sitting or lying position. The elderly may be more prone to dizziness and falling. While there are not adequate studies to confirm the safety of NTG in pregnant or breast feeding women, it has been used without incident so far. We recommend waiting at least one hour after applying the NTG ointment before breast feeding.  Do not use NTG ointment if you are taking drugs for sexual problems [e.g., sildenafil (Viagra), tadalafil (Cialis), vardenafil (Levitra)]. Use caution before taking cough-and-cold products, diet aids, or NSAIDs preparations because they may contain ingredients that could increase your blood pressure, cause a fast heartbeat, or increase chest pain (e.g., pseudoephedrine, phenylephrine, chlorpheniramine, diphenhydramine, clemastine, ibuprofen, and naproxen). Tell us if you drink alcohol, take alteplase, migraine drugs (ergotamine), water pills/diuretics such as furosemide or hydrochlorothiazide, or other drugs for high blood pressure (beta blockers, calcium channel blockers, ACE inhibitors).  Store the NTG at room temperature and keep away from light and moisture. Close the container tightly after each use. Do not store in the bathroom. Keep away from children and pets. If you have any questions or problems please call us at  [phone].  Please use the [facility]are over the counter.    Thank you for choosing [facility]  Dr Wilfrid Lund III

## 2017-07-30 NOTE — Progress Notes (Signed)
Late Entry  IPF PRO Registry Purpose: To collect data and biological samples that will support future research studies.  Registry will describe current approaches to diagnosis and treatment of IPF, analyze participant characteristics to describe the natural history of the disease, assess quality of life, describe participants interactions with the health care system, describe IPF treatment practices across multiple institutions, and utilize biological samples linked to well characterized IPF participants to identify disease biomarkers.   Clinical Research Coordinator / Research RN note : This visit for Subject Miranda [name] with DOB: 02/21/1946 on 07/29/2017 for the above protocol is Visit/Encounter 6 month follow up and is for purpose of research . The consent for this encounter is under Protocol Version October 12, 2016 and  is currently IRB approved. Subject expressed continued interest and consent in continuing as a study subject. Subject confirmed that there was  no change in contact information (e.g. address, telephone, email). Subject thanked for participation in research and contribution to science.   In this research visit 07/29/2017 the subject completed all required questionnaires and blood collection samples as per required in the above stated protocol. The subject will have their next visit in approximately 6 months and will be scheduled at a later date. For further documentation on today's visit please refer to subjects paper source binder.   Signed by  T. Early Chars BS, [place], [place] 10:58 AM 07/30/2017

## 2017-08-02 ENCOUNTER — Telehealth: Payer: Self-pay | Admitting: Internal Medicine

## 2017-08-02 NOTE — Telephone Encounter (Signed)
Dr. Chase Caller,   Miranda [name] (November 15, 1945) was seen in the office for her follow up research visit for the IPF Pro registry study. During the visit the subject mentioned that she used to meet regularly with a Oval Linsey (nurse) at the [facility] about her [place]. The subject mentioned that it had been some time since she has seen her their and she was wondering why? I had no information to provide her other than I would relay the information to you and someone would follow up with her with any available information.  Thanks,  Cam M.

## 2017-08-02 NOTE — Telephone Encounter (Signed)
Forwarding to BJT as I no longer work with Dr. [name].

## 2017-08-02 NOTE — Telephone Encounter (Signed)
Miranda [name] is a [facility] patient. She asked the research coordinator a standard of care question. So forwarding to you to address  Thanks  Dr. Brand Males, M.D., [facility].C.P Pulmonary and Critical Care Medicine Staff Physician, [place] Director - Interstitial Lung Disease  Program  Pulmonary [place] at [place], [place], [address]  Pager: [phone], If no answer or between  15:00h - 7:00h: call [phone] Telephone: [phone]

## 2017-08-03 NOTE — Telephone Encounter (Signed)
Attempted to call patient, no answer, left message to call back.  

## 2017-08-04 NOTE — Telephone Encounter (Signed)
Pt is calling back [phone] 

## 2017-08-05 NOTE — Telephone Encounter (Signed)
Patient is calling back again.  She is asking for someone to call her today, BJT is out of office.  CB is [phone].

## 2017-08-05 NOTE — Telephone Encounter (Signed)
Called Miranda [name] and left message for her to call me back at [phone]. She did not answer but the purpose for my call was to inform her that [facility] only collects the labs, they are not processed here. Once collected we send them off for testing at an offsite laboratory as part of the clinical trial protocol and we do not receive the results.

## 2017-08-05 NOTE — Telephone Encounter (Signed)
Called the clinical coordinator line @ [phone], option 3 Spoke with Zoe - she reported that patient has not met with the clinical coordinator because she completed all of her 5 sessions with said coordinator.  Apparently, due to amount of coordinators vs patients they are only able to have 5 sessions.  Also, they already spoke with patient yesterday and explained the above.  Zoe did recommend to patient to call the same number above, option 1 for some additional assistance/counseling.    Called spoke with patient who verified that she did speak with someone yesterday - she is okay with the information provided.    Pt stated that she had some labs drawn per [facility] and would like these results. Routing message to [employer]

## 2017-08-11 NOTE — Telephone Encounter (Signed)
Called and spoke to the patient. Relayed information from previous messages. Patient verbalized understanding. Patient thanked staff for calling her and stated she did not have any questions at this time. Nothing further is needed.

## 2017-08-23 ENCOUNTER — Ambulatory Visit (INDEPENDENT_AMBULATORY_CARE_PROVIDER_SITE_OTHER): Payer: Medicare Other | Admitting: Internal Medicine

## 2017-08-23 ENCOUNTER — Encounter: Payer: Self-pay | Admitting: Internal Medicine

## 2017-08-23 VITALS — BP 100/60 | HR 63 | Temp 98.5°F | Wt 198.0 lb

## 2017-08-23 DIAGNOSIS — S46912A Strain of unspecified muscle, fascia and tendon at shoulder and upper arm level, left arm, initial encounter: Secondary | ICD-10-CM

## 2017-08-23 DIAGNOSIS — Z5181 Encounter for therapeutic drug level monitoring: Secondary | ICD-10-CM | POA: Diagnosis not present

## 2017-08-23 MED ORDER — METHYLPREDNISOLONE ACETATE 80 MG/ML IJ SUSP
80.0000 mg | Freq: Once | INTRAMUSCULAR | Status: AC
Start: 2017-08-23 — End: 2017-08-23
  Administered 2017-08-23: 80 mg via INTRAMUSCULAR

## 2017-08-23 NOTE — Progress Notes (Signed)
Subjective:    Patient ID: Miranda [name], female    DOB: 07/16/1945, 72 y.o.   MRN: [id number]  HPI 72 year old patient who has chronic low back pain is in the treated with hydrocodone.  She presents with a 1 to 54-month history of left shoulder pain.   This appears to be located in the proximal lateral upper arm just  distal to the shoulder.  She describes local tenderness but very little discomfort with range of motion of the shoulder area She has type 2 diabetes which has been stable.  She was seen last month for her chronic pain management evaluation.  Urine drug screen was ordered but not performed at that time.  7 months ago a urine drug screen was negative but was +4 months ago for hydrocodone metabolites  Past Medical History:  Diagnosis Date  . Anxiety   . Arthritis   . Asthma   . CHF (congestive heart failure) ([place])    pt. unsure- but thinks she was hosp. for CHF- 2002  . Chronic kidney disease    [facility]  . Colon polyps   . Complication of anesthesia    states requires a lot med. to put her to sleep   . DDD (degenerative disc disease) 09/17/2011  . Depression    "sometimes "  . DM (diabetes mellitus) ([place])   . Family history of anesthesia complication   . GERD (gastroesophageal reflux disease)   . Glaucoma    bilateral, pt. admits that she is noncompliant to eye gtts.   . Hemorrhoids   . Hyperlipidemia   . Hypertension    had stress, echo- 2006 /w [place], Cardiac Cath, per pt. 2002, echo repeated 2012- wnl   . Low back pain   . Shortness of breath   . Sleep apnea    uses c-pap- q night recently     Social History   Socioeconomic History  . Marital status: Married    Spouse name: Not on file  . Number of children: 4  . Years of education: Not on file  . Highest education level: Not on file  Occupational History  . Occupation: retired Forensic psychologist: UNEMPLOYED  Social Needs  . Financial resource strain: Not on file  . Food insecurity:    Worry: Not on file    Inability: Not on file  . Transportation needs:    Medical: Not on file    Non-medical: Not on file  Tobacco Use  . Smoking status: Never Smoker  . Smokeless tobacco: Never Used  Substance and Sexual Activity  . Alcohol use: No    Alcohol/week: 0.0 oz  . Drug use: No  . Sexual activity: Never  Lifestyle  . Physical activity:    Days per week: Not on file    Minutes per session: Not on file  . Stress: Not on file  Relationships  . Social connections:    Talks on phone: Not on file    Gets together: Not on file    Attends religious service: Not on file    Active member of club or organization: Not on file    Attends meetings of clubs or organizations: Not on file    Relationship status: Not on file  . Intimate partner violence:    Fear of current or ex partner: Not on file    Emotionally abused: Not on file    Physically abused: Not on file    Forced sexual activity: Not  on file  Other Topics Concern  . Not on file  Social History Narrative  . Not on file    Past Surgical History:  Procedure Laterality Date  . ABDOMINAL HYSTERECTOMY     ectopic, fibroids  . arm surgery [name]   . CARDIAC CATHETERIZATION    . CATARACT EXTRACTION, BILATERAL     cataracts removed bilateral- ?IOL  . COLONOSCOPY     remote  . FISSURECTOMY  10/08/2011   Procedure: FISSURECTOMY;  Surgeon: Stark Klein, MD;  Location: [facility];  Service: General;  Laterality: N/A;  . FLEXIBLE SIGMOIDOSCOPY  02/25/2011   Procedure: FLEXIBLE SIGMOIDOSCOPY;  Surgeon: Inda Castle, MD;  Location: [facility];  Service: Endoscopy;  Laterality: N/A;  . FOOT SURGERY     bilat, heel spurs- screw in R foot   . HEMORRHOID SURGERY  10/08/2011   Procedure: HEMORRHOIDECTOMY;  Surgeon: Stark Klein, MD;  Location: [facility];  Service: General;  Laterality: N/A;  External   . SPHINCTEROTOMY  10/08/2011   Procedure: Joan Mayans;  Surgeon: Stark Klein, MD;  Location: [facility];  Service: General;   Laterality: N/A;    Family History  Problem Relation Age of Onset  . Heart attack Mother   . Heart disease Mother   . Breast cancer Mother   . Emphysema Sister   . Breast cancer Sister   . Arthritis/Rheumatoid Sister   . Asthma Sister   . Lung cancer Sister   . COPD Sister   . Colon cancer Brother   . Colon cancer Brother   . Anesthesia problems Neg Hx     Allergies  Allergen Reactions  . Fish Oil Anaphylaxis  . Other Hives, Shortness Of Breath and Swelling    Allergic to cashew nuts and peanut oil.  Marland Kitchen Penicillins Anaphylaxis, Hives and Swelling    Has patient had a PCN reaction causing immediate rash, facial/tongue/throat swelling, SOB or lightheadedness with hypotension: Face swelling and hives started first, then swelling of the throat  Has patient had a PCN reaction causing severe rash involving mucus membranes or skin necrosis: Yes  Has patient had a PCN reaction that required hospitalization: No  Has patient had a PCN reaction occurring within the last 10 years: Yes  If all of the above answers are "NO", then may proceed with Cephalospor  . Pneumococcal Vaccines Nausea And Vomiting  . Nitrofurantoin Macrocrystal   . Aspirin Itching and Rash  . Bactrim [Sulfamethoxazole-Trimethoprim] Hives, Itching and Rash  . Ciprofloxacin Hives, Itching and Rash  . Ibuprofen Rash  . Influenza Vaccines Hives  . Ivp Dye [Iodinated Diagnostic Agents] Hives, Itching and Rash    Gives benadryl to counteract symptoms  . Latex Rash  . Macrobid [Nitrofurantoin Monohydrate Macrocrystals] Hives  . Shellfish Allergy Hives    Patient also allergic to seafood    Current Outpatient Medications on File Prior to Visit  Medication Sig Dispense Refill  . ALPRAZolam (XANAX) 0.25 MG tablet Take 1 tablet (0.25 mg total) by mouth 3 (three) times daily as needed for anxiety or sleep. 30 tablet 5  . AMBULATORY NON FORMULARY MEDICATION Nitroglycerine ointment 0.125 %  Apply a pea sized amount  internally THREE times daily. Dispense 30 GM zero refill 30 g 0  . amLODipine (NORVASC) 10 MG tablet Take 1 tablet (10 mg total) by mouth daily. 90 tablet 1  . atorvastatin (LIPITOR) 20 MG tablet Take 1 tablet (20 mg total) by mouth daily. 90 tablet 3  . cyclobenzaprine (FLEXERIL) 10 MG tablet  Take 1 tablet (10 mg total) by mouth 3 (three) times daily as needed. 90 tablet 1  . fluticasone (FLONASE) 50 MCG/ACT nasal spray Place 1 spray into both nostrils daily. (Patient taking differently: Place 1 spray into both nostrils daily as needed. ) 16 g 0  . furosemide (LASIX) 40 MG tablet Take 0.5 tablets (20 mg total) by mouth daily. 30 tablet 0  . HYDROcodone-acetaminophen (NORCO) 10-325 MG tablet Take 1 tablet by mouth every 6 (six) hours as needed. 90 tablet 0  . lactulose (CONSTULOSE) 10 GM/15ML solution TAKE 30 MLS BY MOUTH 3 TIMES DAILY AS NEEDED FOR MILD CONSTIPATION 473 mL 1  . losartan (COZAAR) 100 MG tablet Take 1 tablet (100 mg total) by mouth daily. 90 tablet 3  . metFORMIN (GLUCOPHAGE-XR) 500 MG 24 hr tablet Take 2 tablets (1,000 mg total) by mouth daily with breakfast. 180 tablet 3  . metoprolol tartrate (LOPRESSOR) 25 MG tablet Take 1 tablet (25 mg total) by mouth 2 (two) times daily. 180 tablet 6  . Pirfenidone (ESBRIET) 801 MG TABS Take 1 tablet by mouth 3 (three) times daily. 90 tablet 11   No current facility-administered medications on file prior to visit.     BP 100/60 (BP Location: [name] Arm, Patient Position: Sitting, Cuff Size: Large)   Pulse 63   Temp 98.5 F (36.9 C) (Oral)   Wt 198 lb (89.8 kg)   SpO2 96%   BMI 33.72 kg/m        Review of Systems  Constitutional: Negative.   HENT: Negative for congestion, dental problem, hearing loss, rhinorrhea, sinus pressure, sore throat and tinnitus.   Eyes: Negative for pain, discharge and visual disturbance.  Respiratory: Negative for cough and shortness of breath.   Cardiovascular: Negative for chest pain, palpitations  and leg swelling.  Gastrointestinal: Negative for abdominal distention, abdominal pain, blood in stool, constipation, diarrhea, nausea and vomiting.  Genitourinary: Negative for difficulty urinating, dysuria, flank pain, frequency, hematuria, pelvic pain, urgency, vaginal bleeding, vaginal discharge and vaginal pain.  Musculoskeletal: Negative for arthralgias, gait problem and joint swelling.       Left lateral upper arm discomfort and tenderness  Skin: Negative for rash.  Neurological: Negative for dizziness, syncope, speech difficulty, weakness, numbness and headaches.  Hematological: Negative for adenopathy.  Psychiatric/Behavioral: Negative for agitation, behavioral problems and dysphoric mood. The patient is not nervous/anxious.        Objective:   Physical Exam  Constitutional: She appears well-developed and well-nourished. She does not appear ill.  Blood pressure well controlled  Musculoskeletal:  Tenderness involving the lateral proximal upper arm just distal to the elbow.  Range of motion of the left elbow intact.  Left arm extension and flexion did tend to cause discomfort in the region          Assessment & Plan:  Left lateral upper arm pain.  Appears to be musculoligamentous .  She is on hydrocodone for chronic low back pain.  Will check a urine drug screen that was not obtained at the time of her last visit. Due to the chronicity of her symptoms, we will treat with Depo-Medrol 80.  Marletta Lor

## 2017-08-23 NOTE — Patient Instructions (Addendum)
You  may move around, but avoid painful motions and activities.  Apply heat to the sore area for 15 to 20 minutes 3 or 4 times daily for the next two to 3 days.   Shoulder Sprain A shoulder sprain is a partial or complete tear in one of the tough, fiber-like tissues (ligaments) in the shoulder. The ligaments in the shoulder help to hold the shoulder in place. What are the causes? This condition may be caused by:  A fall.  A hit to the shoulder.  A twist of the arm.  What increases the risk? This condition is more likely to develop in:  People who play sports.  People who have problems with balance or coordination.  What are the signs or symptoms? Symptoms of this condition include:  Pain when moving the shoulder.  Limited ability to move the shoulder.  Swelling and tenderness on top of the shoulder.  Warmth in the shoulder.  A change in the shape of the shoulder.  Redness or bruising on the shoulder.  How is this diagnosed? This condition is diagnosed with a physical exam. During the exam, you may be asked to do simple exercises with your shoulder. You may also have imaging tests, such as X-rays, MRI, or a CT scan. These tests can show how severe the sprain is. How is this treated? This condition may be treated with:  Rest.  Pain medicine.  Ice.  A sling or brace. This is used to keep the arm still while the shoulder is healing.  Physical therapy or rehabilitation exercises. These help to improve the range of motion and strength of the shoulder.  Surgery (rare). Surgery may be needed if the sprain caused a joint to become unstable. Surgery may also be needed to reduce pain.  Some people may develop ongoing shoulder pain or lose some range of motion in the shoulder. However, most people do not develop long-term problems. Follow these instructions at home:  Rest.  Ask your health care provider when it is safe for you to drive if you have a sling or brace on  your shoulder.  Take over-the-counter and prescription medicines only as told by your health care provider.  If directed, apply ice to the area: ? Put ice in a plastic bag. ? Place a towel between your skin and the bag. ? Leave the ice on for 20 minutes, 2-3 times per day.  If you were given a shoulder sling or brace: ? Wear it as told. ? Remove it to shower or bathe. ? Move your arm only as much as told by your health care provider, but keep your hand moving to prevent swelling.  If you were shown how to do any exercises, do them as told by your health care provider.  Keep all follow-up visits as told by your health care provider. This is important. Contact a health care provider if:  Your pain gets worse.  Your pain is not relieved with medicines.  You have increased redness or swelling. Get help right away if:  You have a fever.  You cannot move your arm or shoulder.  You develop severe numbness or tingling in your arm, hand, or fingers.  Your arm, hand, or fingers turn blue, white, or gray and feel cold. This information is not intended to replace advice given to you by your health care provider. Make sure you discuss any questions you have with your health care provider. Document Released: 07/19/2008 Document Revised: 10/27/2015 Document Reviewed:  06/25/2014 Elsevier Interactive Patient Education  [employer].

## 2017-08-25 DIAGNOSIS — E119 Type 2 diabetes mellitus without complications: Secondary | ICD-10-CM | POA: Diagnosis not present

## 2017-08-25 DIAGNOSIS — Z961 Presence of intraocular lens: Secondary | ICD-10-CM | POA: Diagnosis not present

## 2017-08-25 DIAGNOSIS — H40023 Open angle with borderline findings, high risk, bilateral: Secondary | ICD-10-CM | POA: Diagnosis not present

## 2017-08-26 LAB — PAIN MGMT, PROFILE 8 W/CONF, U
6 Acetylmorphine: NEGATIVE ng/mL (ref <10)
ALCOHOL METABOLITES: NEGATIVE ng/mL (ref <500)
ALPHAHYDROXYMIDAZOLAM: NEGATIVE ng/mL (ref <50)
Alphahydroxyalprazolam: 42 ng/mL — ABNORMAL HIGH (ref <25)
Alphahydroxytriazolam: NEGATIVE ng/mL (ref <50)
Aminoclonazepam: NEGATIVE ng/mL (ref <25)
Amphetamines: NEGATIVE ng/mL (ref <500)
Benzodiazepines: POSITIVE ng/mL — AB (ref <100)
Buprenorphine, Urine: NEGATIVE ng/mL (ref <5)
COCAINE METABOLITE: NEGATIVE ng/mL (ref <150)
CREATININE: 129.8 mg/dL
Hydroxyethylflurazepam: NEGATIVE ng/mL (ref <50)
LORAZEPAM: NEGATIVE ng/mL (ref <50)
MDMA: NEGATIVE ng/mL (ref <500)
Marijuana Metabolite: NEGATIVE ng/mL (ref <20)
Nordiazepam: NEGATIVE ng/mL (ref <50)
Opiates: NEGATIVE ng/mL (ref <100)
Oxazepam: NEGATIVE ng/mL (ref <50)
Oxidant: NEGATIVE ug/mL (ref <200)
Oxycodone: NEGATIVE ng/mL (ref <100)
Temazepam: NEGATIVE ng/mL (ref <50)
pH: 6.52 (ref 4.5–9.0)

## 2017-08-28 DIAGNOSIS — G4733 Obstructive sleep apnea (adult) (pediatric): Secondary | ICD-10-CM | POA: Diagnosis not present

## 2017-08-31 ENCOUNTER — Encounter: Payer: Self-pay | Admitting: Gastroenterology

## 2017-09-08 ENCOUNTER — Other Ambulatory Visit: Payer: Self-pay | Admitting: Internal Medicine

## 2017-09-08 DIAGNOSIS — J9601 Acute respiratory failure with hypoxia: Secondary | ICD-10-CM | POA: Diagnosis not present

## 2017-09-08 DIAGNOSIS — G4733 Obstructive sleep apnea (adult) (pediatric): Secondary | ICD-10-CM | POA: Diagnosis not present

## 2017-09-14 ENCOUNTER — Encounter (HOSPITAL_COMMUNITY): Payer: Self-pay | Admitting: Emergency Medicine

## 2017-09-14 ENCOUNTER — Other Ambulatory Visit: Payer: Self-pay

## 2017-09-14 ENCOUNTER — Emergency Department (HOSPITAL_COMMUNITY): Payer: Medicare Other

## 2017-09-14 ENCOUNTER — Emergency Department (HOSPITAL_COMMUNITY)
Admission: EM | Admit: 2017-09-14 | Discharge: 2017-09-15 | Disposition: A | Discharge status: Home / Self Care | Payer: Medicare Other | Attending: Emergency Medicine | Admitting: Emergency Medicine

## 2017-09-14 ENCOUNTER — Ambulatory Visit: Payer: Medicare Other | Admitting: Gastroenterology

## 2017-09-14 ENCOUNTER — Encounter: Payer: Self-pay | Admitting: Gastroenterology

## 2017-09-14 VITALS — BP 161/114 | HR 110 | Temp 96.0°F | Resp 24 | Ht 64.25 in | Wt 198.0 lb

## 2017-09-14 DIAGNOSIS — I130 Hypertensive heart and chronic kidney disease with heart failure and stage 1 through stage 4 chronic kidney disease, or unspecified chronic kidney disease: Secondary | ICD-10-CM | POA: Insufficient documentation

## 2017-09-14 DIAGNOSIS — Z7984 Long term (current) use of oral hypoglycemic drugs: Secondary | ICD-10-CM | POA: Insufficient documentation

## 2017-09-14 DIAGNOSIS — N189 Chronic kidney disease, unspecified: Secondary | ICD-10-CM | POA: Insufficient documentation

## 2017-09-14 DIAGNOSIS — R0789 Other chest pain: Secondary | ICD-10-CM | POA: Diagnosis not present

## 2017-09-14 DIAGNOSIS — I4891 Unspecified atrial fibrillation: Secondary | ICD-10-CM

## 2017-09-14 DIAGNOSIS — I509 Heart failure, unspecified: Secondary | ICD-10-CM | POA: Insufficient documentation

## 2017-09-14 DIAGNOSIS — E1122 Type 2 diabetes mellitus with diabetic chronic kidney disease: Secondary | ICD-10-CM | POA: Diagnosis not present

## 2017-09-14 DIAGNOSIS — R0989 Other specified symptoms and signs involving the circulatory and respiratory systems: Secondary | ICD-10-CM | POA: Diagnosis not present

## 2017-09-14 DIAGNOSIS — J45909 Unspecified asthma, uncomplicated: Secondary | ICD-10-CM | POA: Insufficient documentation

## 2017-09-14 DIAGNOSIS — K600 Acute anal fissure: Secondary | ICD-10-CM

## 2017-09-14 DIAGNOSIS — Z79899 Other long term (current) drug therapy: Secondary | ICD-10-CM | POA: Diagnosis not present

## 2017-09-14 DIAGNOSIS — Z91040 Latex allergy status: Secondary | ICD-10-CM | POA: Diagnosis not present

## 2017-09-14 DIAGNOSIS — R0602 Shortness of breath: Secondary | ICD-10-CM | POA: Diagnosis not present

## 2017-09-14 LAB — I-STAT TROPONIN, ED
TROPONIN I, POC: 0 ng/mL (ref 0.00–0.08)
TROPONIN I, POC: 0 ng/mL (ref 0.00–0.08)

## 2017-09-14 LAB — BASIC METABOLIC PANEL
Anion gap: 10 (ref 5–15)
BUN: 9 mg/dL (ref 8–23)
CO2: 23 mmol/L (ref 22–32)
Calcium: 9.1 mg/dL (ref 8.9–10.3)
Chloride: 110 mmol/L (ref 98–111)
Creatinine, Ser: 0.79 mg/dL (ref 0.44–1.00)
GFR calc Af Amer: >60 mL/min (ref >60)
GFR calc non Af Amer: >60 mL/min (ref >60)
GLUCOSE: 113 mg/dL — AB (ref 70–99)
POTASSIUM: 5.2 mmol/L — AB (ref 3.5–5.1)
Sodium: 143 mmol/L (ref 135–145)

## 2017-09-14 LAB — CBC
HEMATOCRIT: 46.2 % — AB (ref 36.0–46.0)
Hemoglobin: 14.5 g/dL (ref 12.0–15.0)
MCH: 27.3 pg (ref 26.0–34.0)
MCHC: 31.4 g/dL (ref 30.0–36.0)
MCV: 87 fL (ref 78.0–100.0)
Platelets: 205 10*3/uL (ref 150–400)
RBC: 5.31 MIL/uL — ABNORMAL HIGH (ref 3.87–5.11)
RDW: 13.9 % (ref 11.5–15.5)
WBC: 5.6 10*3/uL (ref 4.0–10.5)

## 2017-09-14 LAB — BRAIN NATRIURETIC PEPTIDE: B Natriuretic Peptide: 29.7 pg/mL (ref 0.0–100.0)

## 2017-09-14 LAB — TSH: TSH: 1.499 u[IU]/mL (ref 0.350–4.500)

## 2017-09-14 LAB — MAGNESIUM: Magnesium: 2.3 mg/dL (ref 1.7–2.4)

## 2017-09-14 MED ORDER — DIPHENHYDRAMINE HCL 50 MG/ML IJ SOLN
50.0000 mg | Freq: Once | INTRAMUSCULAR | Status: DC
  Filled 2017-09-14: qty 1

## 2017-09-14 MED ORDER — IOPAMIDOL (ISOVUE-370) INJECTION 76%
INTRAVENOUS | Status: AC
  Filled 2017-09-14: qty 100

## 2017-09-14 MED ORDER — RIVAROXABAN 20 MG PO TABS: 20.0000 mg | ORAL_TABLET | Freq: Every day | ORAL | 0 refills | Status: DC

## 2017-09-14 MED ORDER — SODIUM CHLORIDE 0.9 % IV SOLN: 500.0000 mL | Freq: Once | INTRAVENOUS | Status: DC

## 2017-09-14 MED ORDER — DIPHENHYDRAMINE HCL 50 MG/ML IJ SOLN
50.0000 mg | Freq: Once | INTRAMUSCULAR | Status: AC
  Administered 2017-09-14: 50 mg via INTRAVENOUS

## 2017-09-14 MED ORDER — IOPAMIDOL (ISOVUE-370) INJECTION 76%
100.0000 mL | Freq: Once | INTRAVENOUS | Status: AC | PRN
  Administered 2017-09-14: 100 mL via INTRAVENOUS

## 2017-09-14 MED ORDER — HYDROCORTISONE NA SUCCINATE PF 100 MG IJ SOLR
200.0000 mg | Freq: Once | INTRAMUSCULAR | Status: AC
Start: 2017-09-14 — End: 2017-09-14
  Administered 2017-09-14: 200 mg via INTRAVENOUS
  Filled 2017-09-14: qty 4

## 2017-09-14 NOTE — Progress Notes (Signed)
Pt came into recovery with after procedure had been canceled by Dr. Loletha Carrow for having periods off SVT. Pt does not have a history of SVT. Pt verbalize having pain in left upper chest rating a 6 out of a 0-10 pain scale.Dr. Loletha Carrow in to speak with pt and pt family member (granddaughter).Per Dr. Loletha Carrow pt needs to go to ER via EMS for cardiology work up. EKG is being obtain in the recovery. Dr. Loletha Carrow reviewed ekg. EMS in to get pt and Dr. Loletha Carrow given report. Pt hooked to monitor and being transported to [facility] ER. Verbalized to pt husband pt is being transported to [facility] ER. Pt husband is taking granddaughter to work then going to [employer]. Pt vital signs 161/114 82 92% 25 . Randall Hiss called report to ER.

## 2017-09-14 NOTE — ED Notes (Signed)
Pt ready for discharge IV removed VS documented

## 2017-09-14 NOTE — ED Provider Notes (Addendum)
[facility] EMERGENCY DEPARTMENT Provider Note   CSN: [id number] Arrival date & time: 09/14/17  1544     History   Chief Complaint Chief Complaint  Patient presents with  . Atrial Fibrillation    HPI Miranda [name] is a 72 y.o. female.  HPI  Miranda [name] is a 72yo female with a history of CHF (EF 60 to 65%), type 2 diabetes, hypertension, hyperlipidemia, CKD, PE, OSA who presents to the emergency department from [facility] endoscopy for evaluation of new onset atrial fibrillation.  Patient reports that she has never been diagnosed with atrial fibrillation in the past.  States that she was hooked up to the monitors prior to her colonoscopy and was told that her heart was beating very quickly.  They subsequently sent her here for further evaluation.  Patient states that she has had intermittent left-sided sharp, nonradiating chest pain for the past month now.  These episodes last for seconds to minutes.  She states that she had an episode of sharp chest pain this morning around 10 AM, but it quickly resolved and she denies chest pain currently.  She denies associated shortness of breath, nausea/vomiting, lightheadedness or syncope with the chest pain.  Reports that when she was told that she had atrial fibrillation earlier today, she felt very overwhelmed and believes that this is why she started feeling short of breath.  She denies shortness of breath prior to being told that she was in atrial fibrillation.  She also states that she feels as if her left chest is tight.  She had a PE 04/2016 and was on Xarelto for a year, but is no longer taking this.  She denies leg swelling or calf tenderness, recent surgery or immobilization, active cancer, exogenous estrogen.  She denies history of exertional chest pain.  Denies alcohol use.  Denies thyroid problems.  Denies fevers, chills, palpitations, cough, congestion, sore throat, abdominal pain, nausea/vomiting, diarrhea, dysuria,  lightheadedness or syncope.  Past Medical History:  Diagnosis Date  . Anxiety   . Arthritis   . Asthma   . CHF (congestive heart failure) ([place])    pt. unsure- but thinks she was hosp. for CHF- 2002  . [facility]    [facility]  . Clotting disorder ([place])    blood clots in lungs/PE pulmonary embolism  . Colon polyps   . Complication of anesthesia    states requires a lot med. to put her to sleep   . DDD (degenerative disc disease) 09/17/2011  . Depression    "sometimes "  . DM (diabetes mellitus) ([place])   . Family history of anesthesia complication   . GERD (gastroesophageal reflux disease)   . Glaucoma    bilateral, pt. admits that she is noncompliant to eye gtts.   . Hemorrhoids   . Hyperlipidemia   . Hypertension    had stress, echo- 2006 /w [place], Cardiac Cath, per pt. 2002, echo repeated 2012- wnl   . Low back pain   . Shortness of breath   . Sleep apnea    uses c-pap- q night recently    Patient Active Problem List   Diagnosis Date Noted  . Diabetes mellitus without complication ([place]) 47/42/5956  . Pulmonary emboli ([place]) 05/02/2016  . Right leg pain 05/01/2016  . ILD (interstitial lung disease) ([place]) 05/01/2016  . Bradycardia 05/01/2016  . Coronary artery calcification seen on CAT scan 08/06/2015  . UIP (usual interstitial pneumonitis) ([place]) 04/05/2015  . Osteoarthritis 09/17/2011  .  Pyelonephritis with possible nonobstructing 5 mm calculus by renal ultrasound 09/16/2011  . OSA (obstructive sleep apnea) 06/10/2011  . Anal fissure 02/17/2011  . History of colonic polyps 04/03/2010  . INSOMNIA 09/28/2007  . Asthma 11/22/2006  . Dyslipidemia 08/19/2006  . HTN (hypertension) 08/19/2006  . GERD 08/19/2006  . LOW BACK PAIN 08/19/2006    Past Surgical History:  Procedure Laterality Date  . ABDOMINAL HYSTERECTOMY     ectopic, fibroids  . arm surgery Right   . CARDIAC CATHETERIZATION    . CATARACT EXTRACTION, BILATERAL     cataracts  removed bilateral- ?IOL  . COLONOSCOPY     remote  . FISSURECTOMY  10/08/2011   Procedure: FISSURECTOMY;  Surgeon: Stark Klein, MD;  Location: [facility];  Service: General;  Laterality: N/A;  . FLEXIBLE SIGMOIDOSCOPY  02/25/2011   Procedure: FLEXIBLE SIGMOIDOSCOPY;  Surgeon: Inda Castle, MD;  Location: [facility] ENDOSCOPY;  Service: Endoscopy;  Laterality: N/A;  . FOOT SURGERY     bilat, heel spurs- screw in R foot   . HEMORRHOID SURGERY  10/08/2011   Procedure: HEMORRHOIDECTOMY;  Surgeon: Stark Klein, MD;  Location: [facility];  Service: General;  Laterality: N/A;  External   . SPHINCTEROTOMY  10/08/2011   Procedure: Joan Mayans;  Surgeon: Stark Klein, MD;  Location: [facility];  Service: General;  Laterality: N/A;     OB History   None      Home Medications    Prior to Admission medications   Medication Sig Start Date End Date Taking? Authorizing Provider  ALPRAZolam (XANAX) 0.25 MG tablet Take 1 tablet (0.25 mg total) by mouth 3 (three) times daily as needed for anxiety or sleep. 05/27/17   Marletta Lor, MD  AMBULATORY NON FORMULARY MEDICATION Nitroglycerine ointment 0.125 %  Apply a pea sized amount internally THREE times daily. Dispense 30 GM zero refill 07/29/17   [name], MD  amLODipine (NORVASC) 10 MG tablet Take 1 tablet (10 mg total) by mouth daily. 04/20/16   Marletta Lor, MD  atorvastatin (LIPITOR) 20 MG tablet Take 1 tablet (20 mg total) by mouth daily. 03/23/17   Marletta Lor, MD  cyclobenzaprine (FLEXERIL) 10 MG tablet Take 1 tablet (10 mg total) by mouth 3 (three) times daily as needed. 11/09/16   Marletta Lor, MD  fluticasone (FLONASE) 50 MCG/ACT nasal spray Place 1 spray into both nostrils daily. Patient not taking: Reported on 09/14/2017 06/07/17   Billie Ruddy, MD  furosemide (LASIX) 40 MG tablet Take 0.5 tablets (20 mg total) by mouth daily. 04/20/16   Marletta Lor, MD  HYDROcodone-acetaminophen [facility]) 10-325 MG tablet Take 1  tablet by mouth every 6 (six) hours as needed. 07/14/17   Marletta Lor, MD  lactulose (CONSTULOSE) 10 GM/15ML solution TAKE 30 MLS BY MOUTH 3 TIMES DAILY AS NEEDED FOR MILD CONSTIPATION 03/23/17   Marletta Lor, MD  losartan (COZAAR) 100 MG tablet Take 1 tablet (100 mg total) by mouth daily. 12/07/16   Marletta Lor, MD  metFORMIN (GLUCOPHAGE-XR) 500 MG 24 hr tablet Take 2 tablets (1,000 mg total) by mouth daily with breakfast. 04/08/17   Marletta Lor, MD  metoprolol tartrate (LOPRESSOR) 25 MG tablet Take 1 tablet (25 mg total) by mouth 2 (two) times daily. 12/07/16   Marletta Lor, MD  Pirfenidone (ESBRIET) 801 MG TABS Take 1 tablet by mouth 3 (three) times daily. 03/29/17   Juanito Doom, MD    Family History Family  History  Problem Relation Age of Onset  . Heart attack Mother   . Heart disease Mother   . Breast cancer Mother   . Emphysema Sister   . Breast cancer Sister   . Arthritis/Rheumatoid Sister   . Asthma Sister   . Lung cancer Sister   . COPD Sister   . Colon cancer Brother   . Colon cancer Brother   . Anesthesia problems Neg Hx     Social History Social History   Tobacco Use  . Smoking status: Never Smoker  . Smokeless tobacco: Never Used  Substance Use Topics  . Alcohol use: No    Alcohol/week: 0.0 oz  . Drug use: No     Allergies   Fish oil; Other; Penicillins; Pneumococcal vaccines; Nitrofurantoin macrocrystal; Aspirin; Bactrim [sulfamethoxazole-trimethoprim]; Ciprofloxacin; Ibuprofen; Influenza vaccines; Ivp dye [iodinated diagnostic agents]; Latex; Macrobid [nitrofurantoin monohydrate macrocrystals]; and Shellfish allergy   Review of Systems Review of Systems  Constitutional: Negative for chills and fever.  HENT: Negative for congestion and sore throat.   Eyes: Negative for visual disturbance.  Respiratory: Positive for shortness of breath. Negative for cough and wheezing.   Cardiovascular: Positive for chest pain  (intermittently for a month, denies this currently). Negative for palpitations and leg swelling.  Gastrointestinal: Negative for abdominal pain, nausea and vomiting.  Genitourinary: Negative for difficulty urinating and dysuria.  Musculoskeletal: Negative for back pain and gait problem.  Skin: Negative for rash.  Neurological: Negative for syncope, light-headedness and headaches.  Psychiatric/Behavioral: Negative for agitation.     Physical Exam Updated Vital Signs BP (!) 134/98   Pulse 76   Temp 98.1 F (36.7 C) (Oral)   Resp 15   Ht 5\' 5"  (1.651 m)   Wt 89.8 kg (198 lb)   SpO2 99%   BMI 32.95 kg/m   Physical Exam  Constitutional: She is oriented to person, place, and time. She appears well-developed and well-nourished. No distress.  Sitting at bedside in no apparent distress, nontoxic-appearing.  HENT:  Head: Normocephalic and atraumatic.  Mouth/Throat: Oropharynx is clear and moist. No oropharyngeal exudate.  Eyes: Pupils are equal, round, and reactive to light. Conjunctivae are normal. Right eye exhibits no discharge. Left eye exhibits no discharge.  Neck: Normal range of motion. Neck supple. No JVD present. No tracheal deviation present.  No thyromegaly or thyroid nodules.  Cardiovascular: Intact distal pulses.  No murmur heard. Irregularly irregular, tachycardic  Pulmonary/Chest: Effort normal and breath sounds normal. No stridor. No respiratory distress. She has no wheezes. She has no rales.  Abdominal: Soft. Bowel sounds are normal. There is no tenderness. There is no guarding.  Musculoskeletal:  No leg swelling or calf tenderness appreciated.  Neurological: She is alert and oriented to person, place, and time. Coordination normal.  Skin: Skin is warm and dry. Capillary refill takes less than 2 seconds. She is not diaphoretic.  Psychiatric: She has a normal mood and affect. Her behavior is normal.  Nursing note and vitals reviewed.   ED Treatments / Results   Labs (all labs ordered are listed, but only abnormal results are displayed) Labs Reviewed  BASIC METABOLIC PANEL - Abnormal; Notable for the following components:      Result Value   Potassium 5.2 (*)    Glucose, Bld 113 (*)    All other components within normal limits  CBC - Abnormal; Notable for the following components:   RBC 5.31 (*)    HCT 46.2 (*)    All other components within  normal limits  MAGNESIUM  BRAIN NATRIURETIC PEPTIDE  TSH  I-STAT TROPONIN, ED  I-STAT TROPONIN, ED    EKG EKG Interpretation  Date/Time:  Tuesday September 14 2017 15:49:23 EDT Ventricular Rate:  137 PR Interval:    QRS Duration: 85 QT Interval:  299 QTC Calculation: 433 R Axis:   -3 Text Interpretation:  Atrial fibrillation Ventricular premature complex Low voltage, precordial leads Nonspecific T abnormalities, lateral leads Artifact in lead(s) I II aVR V1 and baseline wander in lead(s) V1 Confirmed by Fredia Sorrow [phone]) on 09/14/2017 4:08:21 PM   Radiology Dg Chest 2 View  Result Date: 09/14/2017 CLINICAL DATA:  Atrial fibrillation EXAM: CHEST - 2 VIEW COMPARISON:  Chest x-ray of 03/01/2017 FINDINGS: There is little change in prominent markings at the lung base better seen on the lateral view possibly at the right lung base medially. Although this may represent atelectasis patchy pneumonia cannot be excluded. No pleural effusion is seen. Mediastinal and hilar contours are unremarkable and cardiomegaly is stable. No bony abnormality is seen. IMPRESSION: 1. Persistent prominent markings at the lung base particularly on the lateral view. Atelectasis versus pneumonia. 2. Stable cardiomegaly. Electronically Signed   By: Ivar Drape M.D.   On: 09/14/2017 16:24    Procedures Procedures (including critical care time)  CRITICAL CARE Performed by: Glyn Ade   Total critical care time: 35 minutes  Critical care time was exclusive of separately billable procedures and treating other  patients.  Critical care was necessary to treat or prevent imminent or life-threatening deterioration.  Critical care was time spent personally by me on the following activities: development of treatment plan with patient and/or surrogate as well as nursing, discussions with consultants, evaluation of patient's response to treatment, examination of patient, obtaining history from patient or surrogate, ordering and performing treatments and interventions, ordering and review of laboratory studies, ordering and review of radiographic studies, pulse oximetry and re-evaluation of patient's condition.   Medications Ordered in ED Medications - No data to display   Initial Impression / Assessment and Plan / ED Course  I have reviewed the triage vital signs and the nursing notes.  Pertinent labs & imaging results that were available during my care of the patient were reviewed by me and considered in my medical decision making (see chart for details).    Patient presents to the emergency department for evaluation of new onset atrial fibrillation with RVR.   She admits to intermittent chest pain on the left side for the past month which lasts minutes at a time.  She denies chest pain currently, but does state that her chest feels tight and she feels mildly short of breath.  Has a history of PE and CHF.  On exam she is afebrile and nontoxic-appearing.  On initial presentation her pulse is 155 bpm, irregularly irregular rhythm.  Lungs clear to auscultation.  No leg swelling or signs of fluid overload.  Labs reviewed.  CBC and BMP unremarkable.  BNP WNL.  I-STAT troponin negative.  TSH WNL.  Chest x-ray with stable cardiomegaly, no pneumonia.  About 10 minutes after I initially saw patient, she has self converted into NSR.  Given her history of PE and the fact that she is no longer on anticoagulation, will get CT Angio of her chest for further evaluation.  Will also get delta troponin.  Do not suspect acute  CHF exacerbation given no signs of fluid overload on exam, BNP WNL and CXR without pulmonary edema.  TSH WNL,  no concern for thyrotoxicosis.  Delta i-STAT troponin negative, and EKG nonischemic.  Patient denies chest pain and I do not suspect ACS based on results and her symptoms.  Awaiting CT angio chest to evaluate for PE.  On recheck patient continues to be in normal sinus rhythm.  Patient's CHA2DS2/VAS score is 8.  Plan to start her on Xarelto, given her high risk for stroke and she has previously been on this medication and done well with it. Sign out given at shift change to PA Domenic Moras for disposition following return of CT angio chest. If negative and patient continues to be in NSR, she can be discharged with Xarelto and follow up with cardiology.  This was a shared visit with Dr. Bobby Rumpf who also saw the patient and agrees with this.        Final Clinical Impressions(s) / ED Diagnoses   Final diagnoses:  None    ED Discharge Orders    None       Glyn Ade, PA-C 09/15/17 0005    Glyn Ade, PA-C 09/15/17 0006    Fredia Sorrow, MD 09/16/17 [phone]

## 2017-09-14 NOTE — ED Notes (Signed)
Contacted main lab to add on further blood work

## 2017-09-14 NOTE — Progress Notes (Signed)
Patient going in and out of SVT (narrow complex, regular) before any sedation or procedure.  Some of the time having ill-defined chest discomfort, and when further questioned, reported chronic similar symptoms that she had not brought to attention of her physician because they would pass.  The arrhythmia did not break with carotid bulb massage or valsalva.  12-lead EKG obtained and EMS called to bring patient to ED. Explained to patient and family.

## 2017-09-14 NOTE — ED Provider Notes (Signed)
Medical screening examination/treatment/procedure(s) were conducted as a shared visit with non-physician practitioner(s) and myself.  I personally evaluated the patient during the encounter.  EKG Interpretation  Date/Time:  Tuesday September 14 2017 15:49:23 EDT Ventricular Rate:  137 PR Interval:    QRS Duration: 85 QT Interval:  299 QTC Calculation: 433 R Axis:   -3 Text Interpretation:  Atrial fibrillation Ventricular premature complex Low voltage, precordial leads Nonspecific T abnormalities, lateral leads Artifact in lead(s) I II aVR V1 and baseline wander in lead(s) V1 Confirmed by Fredia Sorrow [phone]) on 09/14/2017 4:08:21 PM   Patient seen by me along with physician assistant.  Patient not followed by cardiology.  Patient was sent over from gastroenterology she was undergoing a colonoscopy when staff found her heart rate to be fast and irregular.  Appear to be new onset atrial fibrillation with a rate of 1 10-1 50.  Patient had some chest tightness associated with it.  Patient was given aspirin.  Her blood sugar was 129 blood pressure 160/98 oxygen saturations 98%.  Patient when she first got here was kind of with a waxing and waning atrial  fibrillation rhythm.  But then she converted spontaneously when I saw her and remained in sinus rhythm.  Patient with no prior history of atrial fibrillation.  Patient had CT angios chest without any acute findings.  Patient will need to follow-up with cardiology.  Patient no acute no distress.  Normal  Heart rhythm on exam and abdomen soft and nontender.  Lungs clear bilaterally.  Results for orders placed or performed during the hospital encounter of 01/08/84  Basic metabolic panel  Result Value Ref Range   Sodium 143 135 - 145 mmol/L   Potassium 5.2 (H) 3.5 - 5.1 mmol/L   Chloride 110 98 - 111 mmol/L   CO2 23 22 - 32 mmol/L   Glucose, Bld 113 (H) 70 - 99 mg/dL   BUN 9 8 - 23 mg/dL   Creatinine, Ser 0.79 0.44 - 1.00 mg/dL   Calcium 9.1 8.9 -  10.3 mg/dL   GFR calc non Af Amer >60 >60 mL/min   GFR calc Af Amer >60 >60 mL/min   Anion gap 10 5 - 15  CBC  Result Value Ref Range   WBC 5.6 4.0 - 10.5 K/uL   RBC 5.31 (H) 3.87 - 5.11 MIL/uL   Hemoglobin 14.5 12.0 - 15.0 g/dL   HCT 46.2 (H) 36.0 - 46.0 %   MCV 87.0 78.0 - 100.0 fL   MCH 27.3 26.0 - 34.0 pg   MCHC 31.4 30.0 - 36.0 g/dL   RDW 13.9 11.5 - 15.5 %   Platelets 205 150 - 400 K/uL  Magnesium  Result Value Ref Range   Magnesium 2.3 1.7 - 2.4 mg/dL  Brain natriuretic peptide (IF shortness of breath has been documented this visit)  Result Value Ref Range   B Natriuretic Peptide 29.7 0.0 - 100.0 pg/mL  TSH  Result Value Ref Range   TSH 1.499 0.350 - 4.500 uIU/mL  I-stat troponin, ED  Result Value Ref Range   Troponin i, poc 0.00 0.00 - 0.08 ng/mL   Comment 3          I-Stat Troponin, ED (not at [facility])  Result Value Ref Range   Troponin i, poc 0.00 0.00 - 0.08 ng/mL   Comment 3           Results for orders placed or performed during the hospital encounter of 27/78/24  Basic metabolic  panel  Result Value Ref Range   Sodium 143 135 - 145 mmol/L   Potassium 5.2 (H) 3.5 - 5.1 mmol/L   Chloride 110 98 - 111 mmol/L   CO2 23 22 - 32 mmol/L   Glucose, Bld 113 (H) 70 - 99 mg/dL   BUN 9 8 - 23 mg/dL   Creatinine, Ser 0.79 0.44 - 1.00 mg/dL   Calcium 9.1 8.9 - 10.3 mg/dL   GFR calc non Af Amer >60 >60 mL/min   GFR calc Af Amer >60 >60 mL/min   Anion gap 10 5 - 15  CBC  Result Value Ref Range   WBC 5.6 4.0 - 10.5 K/uL   RBC 5.31 (H) 3.87 - 5.11 MIL/uL   Hemoglobin 14.5 12.0 - 15.0 g/dL   HCT 46.2 (H) 36.0 - 46.0 %   MCV 87.0 78.0 - 100.0 fL   MCH 27.3 26.0 - 34.0 pg   MCHC 31.4 30.0 - 36.0 g/dL   RDW 13.9 11.5 - 15.5 %   Platelets 205 150 - 400 K/uL  Magnesium  Result Value Ref Range   Magnesium 2.3 1.7 - 2.4 mg/dL  Brain natriuretic peptide (IF shortness of breath has been documented this visit)  Result Value Ref Range   B Natriuretic Peptide 29.7 0.0 - 100.0  pg/mL  TSH  Result Value Ref Range   TSH 1.499 0.350 - 4.500 uIU/mL  I-stat troponin, ED  Result Value Ref Range   Troponin i, poc 0.00 0.00 - 0.08 ng/mL   Comment 3          I-Stat Troponin, ED (not at [facility])  Result Value Ref Range   Troponin i, poc 0.00 0.00 - 0.08 ng/mL   Comment 3           Dg Chest 2 View  Result Date: 09/14/2017 CLINICAL DATA:  Atrial fibrillation EXAM: CHEST - 2 VIEW COMPARISON:  Chest x-ray of 03/01/2017 FINDINGS: There is little change in prominent markings at the lung base better seen on the lateral view possibly at the right lung base medially. Although this may represent atelectasis patchy pneumonia cannot be excluded. No pleural effusion is seen. Mediastinal and hilar contours are unremarkable and cardiomegaly is stable. No bony abnormality is seen. IMPRESSION: 1. Persistent prominent markings at the lung base particularly on the lateral view. Atelectasis versus pneumonia. 2. Stable cardiomegaly. Electronically Signed   By: Ivar Drape M.D.   On: 09/14/2017 16:24   Ct Angio Chest Pe W And/or Wo Contrast  Result Date: 09/14/2017 CLINICAL DATA:  Chest tightness and short of breath EXAM: CT ANGIOGRAPHY CHEST WITH CONTRAST TECHNIQUE: Multidetector CT imaging of the chest was performed using the standard protocol during bolus administration of intravenous contrast. Multiplanar CT image reconstructions and MIPs were obtained to evaluate the vascular anatomy. CONTRAST:  159mL ISOVUE-370 IOPAMIDOL (ISOVUE-370) INJECTION 76% COMPARISON:  Chest x-ray 09/14/2017, CT chest 05/08/2016, 05/01/2016 FINDINGS: Cardiovascular: Satisfactory opacification of the pulmonary arteries to the segmental level. No evidence of pulmonary embolism. Nonaneurysmal aorta. Moderate aortic atherosclerosis. Heart size upper normal. No significant pericardial effusion Mediastinum/Nodes: Midline trachea. No thyroid mass. Stable mildly prominent prevascular lymph nodes. Esophagus within normal limits.  Lungs/Pleura: No acute consolidation or effusion. Mild pulmonary fibrosis. Upper Abdomen: No acute abnormality. Musculoskeletal: No chest wall abnormality. No acute or significant osseous findings. Review of the MIP images confirms the above findings. IMPRESSION: 1. Negative for acute pulmonary embolus. 2. Similar findings of pulmonary fibrosis. No acute pulmonary infiltrate. Aortic Atherosclerosis (ICD10-I70.0).  Electronically Signed   By: Donavan Foil M.D.   On: 09/14/2017 23:07      Fredia Sorrow, MD 09/14/17 [phone]

## 2017-09-14 NOTE — ED Notes (Signed)
Pt transported to CT scanner on stretcher.

## 2017-09-14 NOTE — Discharge Instructions (Signed)
You have been diagnosed with new onset atrial fibrillation.  Please start taking blood thinner medication, Xarelto to help decrease risk of stroke.  Call and follow up with the AFIB clinic at your earliest convenient for further care.

## 2017-09-14 NOTE — ED Notes (Signed)
Patient transported to X-ray 

## 2017-09-14 NOTE — Progress Notes (Signed)
Report called to [facility] ED at [phone]

## 2017-09-14 NOTE — ED Triage Notes (Addendum)
Per EMS, pt was at [facility] endoscopy about to get a colonoscopy when staff found her HR to be fast and irregular. New onset of Afib rate of 110-150. Pt reports intermittent chest tightness. Ax to aspirin. EMS VS BP 160/98, R 18, SpO2 98% 2LNC, CBG 129.

## 2017-09-14 NOTE — ED Notes (Signed)
ED Provider at bedside. 

## 2017-09-14 NOTE — ED Notes (Addendum)
Pt denies sob at this time. Pt reported some sob earlier. Pt room air sats have been 97% and above. Pt states she doesn't feel the need for oxygen at this time. Will continue to monitor.

## 2017-09-14 NOTE — ED Provider Notes (Signed)
Received signout at beginning of shift. Pt with new onset Afib after being at [facility] endoscopy earlier today.  She has spontaneously converted.  CHAD2VASC score of 8, which puts her at high risk for stroke.  She will need to be on a DOAC.  Currently awaits Chest CTA due to reported SOB.    11:17 PM Chest CTA without evidence of PE.  No other acute changes noted.  Pt have been on Xarelto in the past.  Her renal function is normal.  She will be started on Xarelto dose pak and will need to f/u with AFIB clinic and with Cardiology for further care.  Pt made aware of finding and agrees with plan.  She is still in sinus rhythm on reexamination.  She is stable for discharge.  Return precaution given.    BP (!) 149/77   Pulse 69   Temp 98.1 F (36.7 C) (Oral)   Resp 16   Ht 5\' 5"  (1.651 m)   Wt 89.8 kg (198 lb)   SpO2 98%   BMI 32.95 kg/m   Results for orders placed or performed during the hospital encounter of 41/66/06  Basic metabolic panel  Result Value Ref Range   Sodium 143 135 - 145 mmol/L   Potassium 5.2 (H) 3.5 - 5.1 mmol/L   Chloride 110 98 - 111 mmol/L   CO2 23 22 - 32 mmol/L   Glucose, Bld 113 (H) 70 - 99 mg/dL   BUN 9 8 - 23 mg/dL   Creatinine, Ser 0.79 0.44 - 1.00 mg/dL   Calcium 9.1 8.9 - 10.3 mg/dL   GFR calc non Af Amer >60 >60 mL/min   GFR calc Af Amer >60 >60 mL/min   Anion gap 10 5 - 15  CBC  Result Value Ref Range   WBC 5.6 4.0 - 10.5 K/uL   RBC 5.31 (H) 3.87 - 5.11 MIL/uL   Hemoglobin 14.5 12.0 - 15.0 g/dL   HCT 46.2 (H) 36.0 - 46.0 %   MCV 87.0 78.0 - 100.0 fL   MCH 27.3 26.0 - 34.0 pg   MCHC 31.4 30.0 - 36.0 g/dL   RDW 13.9 11.5 - 15.5 %   Platelets 205 150 - 400 K/uL  Magnesium  Result Value Ref Range   Magnesium 2.3 1.7 - 2.4 mg/dL  Brain natriuretic peptide (IF shortness of breath has been documented this visit)  Result Value Ref Range   B Natriuretic Peptide 29.7 0.0 - 100.0 pg/mL  TSH  Result Value Ref Range   TSH 1.499 0.350 - 4.500 uIU/mL   I-stat troponin, ED  Result Value Ref Range   Troponin i, poc 0.00 0.00 - 0.08 ng/mL   Comment 3          I-Stat Troponin, ED (not at [facility])  Result Value Ref Range   Troponin i, poc 0.00 0.00 - 0.08 ng/mL   Comment 3           Dg Chest 2 View  Result Date: 09/14/2017 CLINICAL DATA:  Atrial fibrillation EXAM: CHEST - 2 VIEW COMPARISON:  Chest x-ray of 03/01/2017 FINDINGS: There is little change in prominent markings at the lung base better seen on the lateral view possibly at the right lung base medially. Although this may represent atelectasis patchy pneumonia cannot be excluded. No pleural effusion is seen. Mediastinal and hilar contours are unremarkable and cardiomegaly is stable. No bony abnormality is seen. IMPRESSION: 1. Persistent prominent markings at the lung base particularly on the lateral  view. Atelectasis versus pneumonia. 2. Stable cardiomegaly. Electronically Signed   By: Ivar Drape M.D.   On: 09/14/2017 16:24   Ct Angio Chest Pe W And/or Wo Contrast  Result Date: 09/14/2017 CLINICAL DATA:  Chest tightness and short of breath EXAM: CT ANGIOGRAPHY CHEST WITH CONTRAST TECHNIQUE: Multidetector CT imaging of the chest was performed using the standard protocol during bolus administration of intravenous contrast. Multiplanar CT image reconstructions and MIPs were obtained to evaluate the vascular anatomy. CONTRAST:  19mL ISOVUE-370 IOPAMIDOL (ISOVUE-370) INJECTION 76% COMPARISON:  Chest x-ray 09/14/2017, CT chest 05/08/2016, 05/01/2016 FINDINGS: Cardiovascular: Satisfactory opacification of the pulmonary arteries to the segmental level. No evidence of pulmonary embolism. Nonaneurysmal aorta. Moderate aortic atherosclerosis. Heart size upper normal. No significant pericardial effusion Mediastinum/Nodes: Midline trachea. No thyroid mass. Stable mildly prominent prevascular lymph nodes. Esophagus within normal limits. Lungs/Pleura: No acute consolidation or effusion. Mild pulmonary fibrosis.  Upper Abdomen: No acute abnormality. Musculoskeletal: No chest wall abnormality. No acute or significant osseous findings. Review of the MIP images confirms the above findings. IMPRESSION: 1. Negative for acute pulmonary embolus. 2. Similar findings of pulmonary fibrosis. No acute pulmonary infiltrate. Aortic Atherosclerosis (ICD10-I70.0). Electronically Signed   By: Donavan Foil M.D.   On: 09/14/2017 23:07      Domenic Moras, PA-C 09/14/17 [id number]    Fredia Sorrow, MD 09/16/17 [phone]

## 2017-09-15 ENCOUNTER — Telehealth: Payer: Self-pay

## 2017-09-15 ENCOUNTER — Telehealth (HOSPITAL_COMMUNITY): Payer: Self-pay | Admitting: *Deleted

## 2017-09-15 NOTE — Telephone Encounter (Signed)
LMOM for pt to [facility] for f/u appt.  Pt seen in ED and started on Xarelto

## 2017-09-15 NOTE — Telephone Encounter (Signed)
Left message

## 2017-09-15 NOTE — Telephone Encounter (Signed)
NO ANSWER, MESSAGE LEFT FOR PATIENT. 

## 2017-09-22 ENCOUNTER — Encounter: Payer: Self-pay | Admitting: Internal Medicine

## 2017-09-22 ENCOUNTER — Ambulatory Visit (INDEPENDENT_AMBULATORY_CARE_PROVIDER_SITE_OTHER): Payer: Medicare Other | Admitting: Internal Medicine

## 2017-09-22 VITALS — BP 128/60 | HR 59 | Temp 96.0°F | Wt 196.0 lb

## 2017-09-22 DIAGNOSIS — M5440 Lumbago with sciatica, unspecified side: Secondary | ICD-10-CM

## 2017-09-22 DIAGNOSIS — I517 Cardiomegaly: Secondary | ICD-10-CM | POA: Diagnosis not present

## 2017-09-22 DIAGNOSIS — E119 Type 2 diabetes mellitus without complications: Secondary | ICD-10-CM

## 2017-09-22 DIAGNOSIS — I10 Essential (primary) hypertension: Secondary | ICD-10-CM | POA: Diagnosis not present

## 2017-09-22 LAB — POCT GLYCOSYLATED HEMOGLOBIN (HGB A1C): Hemoglobin A1C: 5.9 % — AB (ref 4.0–5.6)

## 2017-09-22 MED ORDER — RIVAROXABAN 20 MG PO TABS: 20.0000 mg | ORAL_TABLET | Freq: Every day | ORAL | 6 refills | Status: DC

## 2017-09-22 MED ORDER — ALPRAZOLAM 0.25 MG PO TABS: 0.2500 mg | ORAL_TABLET | Freq: Three times a day (TID) | ORAL | 5 refills | Status: DC | PRN

## 2017-09-22 MED ORDER — FUROSEMIDE 40 MG PO TABS: 20.0000 mg | ORAL_TABLET | Freq: Every day | ORAL | 2 refills | Status: DC

## 2017-09-22 NOTE — Progress Notes (Signed)
Subjective:    Patient ID: Miranda [name], female    DOB: 02/27/1946, 72 y.o.   MRN: [id number]  HPI  72 year old patient who has a history of diabetes and essential hypertension.  She also has a history of colonic polyps. She was seen recently for follow-up colonoscopy was noted to have atrial fibrillation with rapid ventricular response.  Procedure was canceled and the patient was evaluated in the ED. She was discharged on Xarelto but she has not started taking yet.  She has had no further tachyarrhythmias She had a pulmonary embolism in February 2018.  Xarelto was discontinued after 12 months of therapy.  A 2D echocardiogram was obtained at that time that revealed LVH but otherwise no significant abnormalities  She has type 2 diabetes.  Hemoglobin A1c today 5.9  Past Medical History:  Diagnosis Date  . Anxiety   . Arthritis   . Asthma   . CHF (congestive heart failure) ([place])    pt. unsure- but thinks she was hosp. for CHF- 2002  . [facility]  . Clotting disorder ([place])    blood clots in lungs/PE pulmonary embolism  . Colon polyps   . Complication of anesthesia    states requires a lot med. to put her to sleep   . DDD (degenerative disc disease) 09/17/2011  . Depression    "sometimes "  . DM (diabetes mellitus) ([place])   . Family history of anesthesia complication   . GERD (gastroesophageal reflux disease)   . Glaucoma    bilateral, pt. admits that she is noncompliant to eye gtts.   . Hemorrhoids   . Hyperlipidemia   . Hypertension    had stress, echo- 2006 /w [place], Cardiac Cath, per pt. 2002, echo repeated 2012- wnl   . Low back pain   . Shortness of breath   . Sleep apnea    uses c-pap- q night recently     Social History   Socioeconomic History  . Marital status: Married    Spouse name: Not on file  . Number of children: 4  . Years of education: Not on file  . Highest education level: Not on file  Occupational History    . Occupation: retired Forensic psychologist: UNEMPLOYED  Social Needs  . Financial resource strain: Not on file  . Food insecurity:    Worry: Not on file    Inability: Not on file  . Transportation needs:    Medical: Not on file    Non-medical: Not on file  Tobacco Use  . Smoking status: Never Smoker  . Smokeless tobacco: Never Used  Substance and Sexual Activity  . Alcohol use: No    Alcohol/week: 0.0 oz  . Drug use: No  . Sexual activity: Never  Lifestyle  . Physical activity:    Days per week: Not on file    Minutes per session: Not on file  . Stress: Not on file  Relationships  . Social connections:    Talks on phone: Not on file    Gets together: Not on file    Attends religious service: Not on file    Active member of club or organization: Not on file    Attends meetings of clubs or organizations: Not on file    Relationship status: Not on file  . Intimate partner violence:    Fear of current or ex partner: Not on file    Emotionally abused: Not on  file    Physically abused: Not on file    Forced sexual activity: Not on file  Other Topics Concern  . Not on file  Social History Narrative  . Not on file    Past Surgical History:  Procedure Laterality Date  . ABDOMINAL HYSTERECTOMY     ectopic, fibroids  . arm surgery [name]   . CARDIAC CATHETERIZATION    . CATARACT EXTRACTION, BILATERAL     cataracts removed bilateral- ?IOL  . COLONOSCOPY     remote  . FISSURECTOMY  10/08/2011   Procedure: FISSURECTOMY;  Surgeon: Stark Klein, MD;  Location: [facility];  Service: General;  Laterality: N/A;  . FLEXIBLE SIGMOIDOSCOPY  02/25/2011   Procedure: FLEXIBLE SIGMOIDOSCOPY;  Surgeon: Inda Castle, MD;  Location: [facility] ENDOSCOPY;  Service: Endoscopy;  Laterality: N/A;  . FOOT SURGERY     bilat, heel spurs- screw in R foot   . HEMORRHOID SURGERY  10/08/2011   Procedure: HEMORRHOIDECTOMY;  Surgeon: Stark Klein, MD;  Location: [facility];  Service: General;  Laterality: N/A;   External   . SPHINCTEROTOMY  10/08/2011   Procedure: Joan Mayans;  Surgeon: Stark Klein, MD;  Location: [facility] OR;  Service: General;  Laterality: N/A;    Family History  Problem Relation Age of Onset  . Heart attack Mother   . Heart disease Mother   . Breast cancer Mother   . Emphysema Sister   . Breast cancer Sister   . Arthritis/Rheumatoid Sister   . Asthma Sister   . Lung cancer Sister   . COPD Sister   . Colon cancer Brother   . Colon cancer Brother   . Anesthesia problems Neg Hx     Allergies  Allergen Reactions  . Fish Oil Anaphylaxis  . Other Hives, Shortness Of Breath and Swelling    Allergic to cashew nuts and peanut oil.  Marland Kitchen Penicillins Anaphylaxis, Hives and Swelling    Has patient had a PCN reaction causing immediate rash, facial/tongue/throat swelling, SOB or lightheadedness with hypotension: Face swelling and hives started first, then swelling of the throat  Has patient had a PCN reaction causing severe rash involving mucus membranes or skin necrosis: Yes  Has patient had a PCN reaction that required hospitalization: No  Has patient had a PCN reaction occurring within the last 10 years: Yes  If all of the above answers are "NO", then may proceed with Cephalospor  . Pneumococcal Vaccines Nausea And Vomiting  . Nitrofurantoin Macrocrystal Hives  . Aspirin Itching and Rash  . Bactrim [Sulfamethoxazole-Trimethoprim] Hives, Itching and Rash  . Ciprofloxacin Hives, Itching and Rash  . Ibuprofen Rash  . Influenza Vaccines Hives  . Ivp Dye [Iodinated Diagnostic Agents] Hives, Itching and Rash    Gives benadryl to counteract symptoms  . Latex Rash  . Macrobid [Nitrofurantoin Monohydrate Macrocrystals] Hives  . Shellfish Allergy Hives    Patient also allergic to seafood    Current Outpatient Medications on File Prior to Visit  Medication Sig Dispense Refill  . AMBULATORY NON FORMULARY MEDICATION Nitroglycerine ointment 0.125 %  Apply a pea sized amount internally  THREE times daily. Dispense 30 GM zero refill 30 g 0  . amLODipine (NORVASC) 10 MG tablet Take 1 tablet (10 mg total) by mouth daily. 90 tablet 1  . atorvastatin (LIPITOR) 20 MG tablet Take 1 tablet (20 mg total) by mouth daily. 90 tablet 3  . cyclobenzaprine (FLEXERIL) 10 MG tablet Take 1 tablet (10 mg total) by mouth 3 (three) times daily  as needed. 90 tablet 1  . fluticasone (FLONASE) 50 MCG/ACT nasal spray Place 1 spray into both nostrils daily. 16 g 0  . HYDROcodone-acetaminophen (NORCO) 10-325 MG tablet Take 1 tablet by mouth every 6 (six) hours as needed. (Patient taking differently: Take 1 tablet by mouth every 6 (six) hours as needed for moderate pain. ) 90 tablet 0  . lactulose (CONSTULOSE) 10 GM/15ML solution TAKE 30 MLS BY MOUTH 3 TIMES DAILY AS NEEDED FOR MILD CONSTIPATION 473 mL 1  . losartan (COZAAR) 100 MG tablet Take 1 tablet (100 mg total) by mouth daily. 90 tablet 3  . metFORMIN (GLUCOPHAGE-XR) 500 MG 24 hr tablet Take 2 tablets (1,000 mg total) by mouth daily with breakfast. 180 tablet 3  . metoprolol tartrate (LOPRESSOR) 25 MG tablet Take 1 tablet (25 mg total) by mouth 2 (two) times daily. 180 tablet 6  . Pirfenidone (ESBRIET) 801 MG TABS Take 1 tablet by mouth 3 (three) times daily. 90 tablet 11  . rivaroxaban (XARELTO) 20 MG TABS tablet Take 1 tablet (20 mg total) by mouth daily with supper. 30 tablet 0   Current Facility-Administered Medications on File Prior to Visit  Medication Dose Route Frequency Provider Last Rate Last Dose  . 0.9 %  sodium chloride infusion  500 mL Intravenous Once Danis, Estill Cotta III, MD        BP 128/60 (BP Location: [name] Arm, Patient Position: Sitting, Cuff Size: Large)   Pulse (!) 59   Temp (!) 96 F (35.6 C) (Oral)   Wt 196 lb (88.9 kg)   SpO2 96%   BMI 32.62 kg/m     Review of Systems  Constitutional: Negative.   HENT: Negative for congestion, dental problem, hearing loss, rhinorrhea, sinus pressure, sore throat and tinnitus.    Eyes: Negative for pain, discharge and visual disturbance.  Respiratory: Negative for cough and shortness of breath.   Cardiovascular: Negative for chest pain, palpitations and leg swelling.  Gastrointestinal: Negative for abdominal distention, abdominal pain, blood in stool, constipation, diarrhea, nausea and vomiting.  Genitourinary: Negative for difficulty urinating, dysuria, flank pain, frequency, hematuria, pelvic pain, urgency, vaginal bleeding, vaginal discharge and vaginal pain.  Musculoskeletal: Positive for arthralgias and back pain. Negative for gait problem and joint swelling.  Skin: Negative for rash.  Neurological: Negative for dizziness, syncope, speech difficulty, weakness, numbness and headaches.  Hematological: Negative for adenopathy.  Psychiatric/Behavioral: Negative for agitation, behavioral problems and dysphoric mood. The patient is not nervous/anxious.        Objective:   Physical Exam  Constitutional: She is oriented to person, place, and time. She appears well-developed and well-nourished.  Blood pressure 130/70  HENT:  Head: Normocephalic.  [name] Ear: External ear normal.  Left Ear: External ear normal.  Mouth/Throat: Oropharynx is clear and moist.  Eyes: Pupils are equal, round, and reactive to light. Conjunctivae and EOM are normal.  Neck: Normal range of motion. Neck supple. No thyromegaly present.  Cardiovascular: Normal rate, regular rhythm, normal heart sounds and intact distal pulses.  Pulses slow and regular.  Rate approximately 60  Pulmonary/Chest: Effort normal and breath sounds normal.  Abdominal: Soft. Bowel sounds are normal. She exhibits no mass. There is no tenderness.  Musculoskeletal: Normal range of motion.  Lymphadenopathy:    She has no cervical adenopathy.  Neurological: She is alert and oriented to person, place, and time.  Skin: Skin is warm and dry. No rash noted.  Psychiatric: She has a normal mood and affect. Her behavior  is  normal.          Assessment & Plan:   Paroxysmal atrial fibrillation.  Patient encouraged to start Xarelto.  New prescription dispensed samples provided Hypertension LVH Diabetes mellitus.  Hemoglobin A1c 5.9 History of colonic polyps.  We will set up for Cologuard screening   We will reassess in 1 month at the time of her 48-month pain management visit  Marletta Lor

## 2017-09-22 NOTE — Patient Instructions (Signed)
Resume Xarelto 1 tablet daily  Limit your sodium (Salt) intake  Please check your blood pressure on a regular basis.  If it is consistently greater than 150/90, please make an office appointment.  Return in 3 months for follow-up

## 2017-09-24 ENCOUNTER — Other Ambulatory Visit: Payer: Self-pay | Admitting: Internal Medicine

## 2017-09-27 DIAGNOSIS — G4733 Obstructive sleep apnea (adult) (pediatric): Secondary | ICD-10-CM | POA: Diagnosis not present

## 2017-10-12 ENCOUNTER — Ambulatory Visit: Payer: Medicare Other | Admitting: Physician Assistant

## 2017-10-12 ENCOUNTER — Encounter: Payer: Self-pay | Admitting: Physician Assistant

## 2017-10-12 VITALS — BP 158/80 | HR 51 | Ht 66.0 in | Wt 198.0 lb

## 2017-10-12 DIAGNOSIS — G4733 Obstructive sleep apnea (adult) (pediatric): Secondary | ICD-10-CM

## 2017-10-12 DIAGNOSIS — E119 Type 2 diabetes mellitus without complications: Secondary | ICD-10-CM

## 2017-10-12 DIAGNOSIS — E785 Hyperlipidemia, unspecified: Secondary | ICD-10-CM | POA: Diagnosis not present

## 2017-10-12 DIAGNOSIS — R011 Cardiac murmur, unspecified: Secondary | ICD-10-CM | POA: Diagnosis not present

## 2017-10-12 DIAGNOSIS — I10 Essential (primary) hypertension: Secondary | ICD-10-CM

## 2017-10-12 DIAGNOSIS — I480 Paroxysmal atrial fibrillation: Secondary | ICD-10-CM

## 2017-10-12 DIAGNOSIS — Z9989 Dependence on other enabling machines and devices: Secondary | ICD-10-CM

## 2017-10-12 NOTE — Patient Instructions (Signed)
Medication Instructions:  Your physician recommends that you continue on your current medications as directed. Please refer to the Current Medication list given to you today.  Labwork: None  Testing/Procedures: Your physician has requested that you have an echocardiogram. Echocardiography is a painless test that uses sound waves to create images of your heart. It provides your doctor with information about the size and shape of your heart and how well your heart's chambers and valves are working. This procedure takes approximately one hour. There are no restrictions for this procedure. [place]  Follow-Up: Your physician recommends that you schedule a follow-up appointment in: 2-3 MONTHS with DR [name].  Any Other Special Instructions Will Be Listed Below (If Applicable). If you need a refill on your cardiac medications before your next appointment, please call your pharmacy.

## 2017-10-12 NOTE — Addendum Note (Signed)
Addended byEulas Post, Terrence Pizana on: 10/12/2017 10:05 AM   Modules accepted: Level of Service

## 2017-10-12 NOTE — Progress Notes (Signed)
Cardiology Office Note    Date:  10/12/2017   ID:  Miranda [name], DOB 1945-09-14, MRN [id number]  PCP:  Marletta Lor, MD  Cardiologist:  New - plan to set up with Dr. [name] (case briefly discussed with DOD Dr. Stanford Breed, DOD did not see the patient)  Chief Complaint  Patient presents with  . New Patient (Initial Visit)    referred by ED Dr. Rogene Houston for new atrial fibrillation    History of Present Illness:  Miranda [name] is a 72 y.o. female with PMH of depression, GERD, hypertension, hyperlipidemia, obstructive sleep apnea on CPAP, DM 2 and recently diagnosed atrial fibrillation.  Patient was remotely seen by Dr. [name] in the hospital on 08/30/2010 for consultation with regard to chest pain.  Her chest pain was felt to be atypical at the time. Lexiscan myoview obtained on 06/13/2014 showed EF 69%, normal perfusion and normal wall motion.  She was diagnosed with acute PE in February 2018 and was started on a 27-month regimen of Xarelto.  High-resolution CT obtained on 05/08/2016 suggested interstitial lung disease as well along with aortic atherosclerosis and two-vessel coronary artery disease.  Echocardiogram obtained on 05/02/2016 showed EF 60 to 65%, grade 1 DD, moderately dilated left atrium.  Repeat CT angiogram of the chest in July 2019 showed resolution of PE.  Patient was most recently seen in the ED on 09/14/2017 after she was sent over from gastroenterology.  She apparently was undergoing colonoscopy when the staff found her heart rate to be fast and irregular.  She was found to be in new atrial fibrillation was rapid ventricular rate.  She spontaneously converted in the emergency room.  TSH was normal.  Troponin negative x2.  Patient was instructed to follow-up with cardiology as outpatient.  Patient presents today for cardiology office visit.  She denies any significant lower extremity edema, orthopnea or PND.  She does have a heart murmur on physical exam, I will repeat  echocardiogram especially given the recent atrial fibrillation and previously known moderately dilated left atrium.  Otherwise she has no cardiac awareness of the atrial fibrillation.  She is aware that she can take additional half a tablet of metoprolol on a as needed basis if she does have recurrence of tachycardia palpitation.  Otherwise I am hesitant to increase the metoprolol permanently given her heart rate of 51 on EKG today.  I will maintain her on the current 25 mg twice daily dose of metoprolol tartrate.  Her blood pressure is elevated today, however her blood pressure was very much normal in her primary care provider's office.  I will hold off on adjusting her blood pressure medication.   Past Medical History:  Diagnosis Date  . Anxiety   . Arthritis   . Asthma   . Atrial fibrillation ([place])   . CHF (congestive heart failure) ([place])    pt. unsure- but thinks she was hosp. for CHF- 2002  . [facility]    [facility]  . Clotting disorder ([place])    blood clots in lungs/PE pulmonary embolism  . Colon polyps   . Complication of anesthesia    states requires a lot med. to put her to sleep   . DDD (degenerative disc disease) 09/17/2011  . Depression    "sometimes "  . DM (diabetes mellitus) ([place])   . Family history of anesthesia complication   . GERD (gastroesophageal reflux disease)   . Glaucoma    bilateral, pt. admits that  she is noncompliant to eye gtts.   . Hemorrhoids   . Hyperlipidemia   . Hypertension    had stress, echo- 2006 /w [place], Cardiac Cath, per pt. 2002, echo repeated 2012- wnl   . Low back pain   . Shortness of breath   . Sleep apnea    uses c-pap- q night recently    Past Surgical History:  Procedure Laterality Date  . ABDOMINAL HYSTERECTOMY     ectopic, fibroids  . arm surgery Right   . CARDIAC CATHETERIZATION    . CATARACT EXTRACTION, BILATERAL     cataracts removed bilateral- ?IOL  . COLONOSCOPY     remote  . FISSURECTOMY   10/08/2011   Procedure: FISSURECTOMY;  Surgeon: Stark Klein, MD;  Location: [facility];  Service: General;  Laterality: N/A;  . FLEXIBLE SIGMOIDOSCOPY  02/25/2011   Procedure: FLEXIBLE SIGMOIDOSCOPY;  Surgeon: Inda Castle, MD;  Location: [facility];  Service: Endoscopy;  Laterality: N/A;  . FOOT SURGERY     bilat, heel spurs- screw in R foot   . HEMORRHOID SURGERY  10/08/2011   Procedure: HEMORRHOIDECTOMY;  Surgeon: Stark Klein, MD;  Location: [facility];  Service: General;  Laterality: N/A;  External   . SPHINCTEROTOMY  10/08/2011   Procedure: Joan Mayans;  Surgeon: Stark Klein, MD;  Location: [facility];  Service: General;  Laterality: N/A;    Current Medications: Outpatient Medications Prior to Visit  Medication Sig Dispense Refill  . ALPRAZolam (XANAX) 0.25 MG tablet Take 1 tablet (0.25 mg total) by mouth 3 (three) times daily as needed for anxiety or sleep. 30 tablet 5  . AMBULATORY NON FORMULARY MEDICATION Nitroglycerine ointment 0.125 %  Apply a pea sized amount internally THREE times daily. Dispense 30 GM zero refill 30 g 0  . amLODipine (NORVASC) 10 MG tablet Take 1 tablet (10 mg total) by mouth daily. 90 tablet 1  . atorvastatin (LIPITOR) 20 MG tablet Take 1 tablet (20 mg total) by mouth daily. 90 tablet 3  . cyclobenzaprine (FLEXERIL) 10 MG tablet Take 1 tablet (10 mg total) by mouth 3 (three) times daily as needed. 90 tablet 1  . fluticasone (FLONASE) 50 MCG/ACT nasal spray Place 1 spray into both nostrils daily. 16 g 0  . furosemide (LASIX) 40 MG tablet Take 0.5 tablets (20 mg total) by mouth daily. 90 tablet 2  . HYDROcodone-acetaminophen (NORCO) 10-325 MG tablet Take 1 tablet by mouth every 6 (six) hours as needed. (Patient taking differently: Take 1 tablet by mouth every 6 (six) hours as needed for moderate pain. ) 90 tablet 0  . lactulose (CONSTULOSE) 10 GM/15ML solution TAKE 30 MILLILITERS THREE TIMES DAILY BY  MOUTH AS NEEDED FOR MILD CONSTIPATION 15 mL 1  . losartan (COZAAR)  100 MG tablet Take 1 tablet (100 mg total) by mouth daily. 90 tablet 3  . metFORMIN (GLUCOPHAGE-XR) 500 MG 24 hr tablet Take 2 tablets (1,000 mg total) by mouth daily with breakfast. 180 tablet 3  . metoprolol tartrate (LOPRESSOR) 25 MG tablet Take 1 tablet (25 mg total) by mouth 2 (two) times daily. 180 tablet 6  . Pirfenidone (ESBRIET) 801 MG TABS Take 1 tablet by mouth 3 (three) times daily. 90 tablet 11  . rivaroxaban (XARELTO) 20 MG TABS tablet Take 1 tablet (20 mg total) by mouth daily with supper. 30 tablet 6   Facility-Administered Medications Prior to Visit  Medication Dose Route Frequency Provider Last Rate Last Dose  . 0.9 %  sodium chloride infusion  500 mL Intravenous Once [name], MD         Allergies:   Fish oil; Other; Penicillins; Pneumococcal vaccines; Nitrofurantoin macrocrystal; Aspirin; Bactrim [sulfamethoxazole-trimethoprim]; Ciprofloxacin; Ibuprofen; Influenza vaccines; Ivp dye [iodinated diagnostic agents]; Latex; Macrobid [nitrofurantoin monohydrate macrocrystals]; and Shellfish allergy   Social History   Socioeconomic History  . Marital status: Married    Spouse name: Not on file  . Number of children: 4  . Years of education: Not on file  . Highest education level: Not on file  Occupational History  . Occupation: retired Forensic psychologist: UNEMPLOYED  Social Needs  . Financial resource strain: Not on file  . Food insecurity:    Worry: Not on file    Inability: Not on file  . Transportation needs:    Medical: Not on file    Non-medical: Not on file  Tobacco Use  . Smoking status: Never Smoker  . Smokeless tobacco: Never Used  Substance and Sexual Activity  . Alcohol use: No    Alcohol/week: 0.0 oz  . Drug use: No  . Sexual activity: Never  Lifestyle  . Physical activity:    Days per week: Not on file    Minutes per session: Not on file  . Stress: Not on file  Relationships  . Social connections:    Talks on phone: Not on file    Gets  together: Not on file    Attends religious service: Not on file    Active member of club or organization: Not on file    Attends meetings of clubs or organizations: Not on file    Relationship status: Not on file  Other Topics Concern  . Not on file  Social History Narrative  . Not on file     Family History:  The patient's family history includes Arthritis/Rheumatoid in her sister; Asthma in her sister; Breast cancer in her mother and sister; COPD in her sister; Colon cancer in her brother and brother; Emphysema in her sister; Heart attack in her mother; Heart disease in her mother; Lung cancer in her sister.   ROS:   Please see the history of present illness.    ROS All other systems reviewed and are negative.   PHYSICAL EXAM:   VS:  BP (!) 158/80   Pulse (!) 51   Ht 5\' 6"  (1.676 m)   Wt 198 lb (89.8 kg)   BMI 31.96 kg/m    GEN: Well nourished, well developed, in no acute distress  HEENT: normal  Neck: no JVD, carotid bruits, or masses Cardiac: RRR; no rubs, or gallops,no edema  2/6 murmur Respiratory:  clear to auscultation bilaterally, normal work of breathing GI: soft, nontender, nondistended, + BS MS: no deformity or atrophy  Skin: warm and dry, no rash Neuro:  Alert and Oriented x 3, Strength and sensation are intact Psych: euthymic mood, full affect  Wt Readings from Last 3 Encounters:  10/12/17 198 lb (89.8 kg)  09/22/17 196 lb (88.9 kg)  09/14/17 198 lb (89.8 kg)      Studies/Labs Reviewed:   EKG:  EKG is ordered today.  The ekg ordered today demonstrates sinus bradycardia, heart rate 51  Recent Labs: 06/09/2017: ALT 16 09/14/2017: B Natriuretic Peptide 29.7; BUN 9; Creatinine, Ser 0.79; Hemoglobin 14.5; Magnesium 2.3; Platelets 205; Potassium 5.2; Sodium 143; TSH 1.499   Lipid Panel    Component Value Date/Time   CHOL 195 06/01/2014 1145   TRIG 84.0 06/01/2014 1145  HDL 55.10 06/01/2014 1145   CHOLHDL 4 06/01/2014 1145   VLDL 16.8 06/01/2014 1145    LDLCALC 123 (H) 06/01/2014 1145   LDLDIRECT 152.8 03/30/2011 0859    Additional studies/ records that were reviewed today include:   Myoview 06/13/2014 Impression Exercise Capacity:  Lexiscan with no exercise. BP Response:  Hypertensive blood pressure response. Clinical Symptoms:  Headache/abdominal pain ECG Impression:  No significant ST segment change suggestive of ischemia. Comparison with Prior Nuclear Study: No images to compare  Overall Impression:  Normal stress nuclear study.  LV Ejection Fraction: 69%.  LV Wall Motion:  NL LV Function; NL Wall Motion      Echo 05/02/2016 LV EF: 60% -   65% Study Conclusions  - Left ventricle: The cavity size was normal. There was severe   concentric hypertrophy. Systolic function was normal. The   estimated ejection fraction was in the range of 60% to 65%.   Doppler parameters are consistent with abnormal left ventricular   relaxation (grade 1 diastolic dysfunction). - Left atrium: The atrium was moderately dilated. - Atrial septum: No defect or patent foramen ovale was identified.     CT of chest 05/08/2016 IMPRESSION: 1. The appearance of the lungs remains compatible with interstitial lung disease. The overall findings remain most suggestive of usual interstitial pneumonia (UIP), but appears centrally unchanged compared to prior study from 06/24/2015. 2. Aortic atherosclerosis, in addition to 2 vessel coronary artery disease. Please note that although the presence of coronary artery calcium documents the presence of coronary artery disease, the severity of this disease and any potential stenosis cannot be assessed on this non-gated CT examination. Assessment for potential risk factor modification, dietary therapy or pharmacologic therapy may be warranted, if clinically indicated.  ASSESSMENT:    1. Paroxysmal atrial fibrillation (HCC)   2. Heart murmur   3. Essential hypertension   4. Hyperlipidemia, unspecified  hyperlipidemia type   5. OSA on CPAP   6. Controlled type 2 diabetes mellitus without complication, without long-term current use of insulin (HCC)      PLAN:  In order of problems listed above:  1. Paroxysmal atrial fibrillation: CHA2DS-Vasc score 4 (age, female, HTN and DM II).  Recently started on Xarelto.  Continue metoprolol for rate control.  Unable to increase metoprolol due to heart rate of 51.  I did instruct the patient to take additional half a tablet of metoprolol if she does have recurrence of tachycardia palpitation.  TSH normal  2. Heart murmur: Obtain echocardiogram especially given the recent onset of atrial fibrillation.  3. Hypertension: Blood pressure elevated today, however was normal during the recent visit with her PCP.  I will hold off on adjusting blood pressure medication  4. Hyperlipidemia: On Lipitor 20 mg daily.  Will defer annual lipid panel to primary care provider  5. DM2: On metformin.  Recent hemoglobin A1c obtained on 09/22/2017 was 5.9.    Medication Adjustments/Labs and Tests Ordered: Current medicines are reviewed at length with the patient today.  Concerns regarding medicines are outlined above.  Medication changes, Labs and Tests ordered today are listed in the Patient Instructions below. Patient Instructions  Medication Instructions:  Your physician recommends that you continue on your current medications as directed. Please refer to the Current Medication list given to you today.  Labwork: None  Testing/Procedures: Your physician has requested that you have an echocardiogram. Echocardiography is a painless test that uses sound waves to create images of your heart. It provides your doctor with  information about the size and shape of your heart and how well your heart's chambers and valves are working. This procedure takes approximately one hour. There are no restrictions for this procedure. [place]  Follow-Up: Your  physician recommends that you schedule a follow-up appointment in: 2-3 MONTHS with DR [name].  Any Other Special Instructions Will Be Listed Below (If Applicable). If you need a refill on your cardiac medications before your next appointment, please call your pharmacy.     Hilbert Corrigan, [place]  10/12/2017 10:04 AM    [address], [place], [place]  [address] Phone: [phone]; Fax: [phone]

## 2017-10-18 ENCOUNTER — Ambulatory Visit (INDEPENDENT_AMBULATORY_CARE_PROVIDER_SITE_OTHER): Payer: Medicare Other | Admitting: Internal Medicine

## 2017-10-18 ENCOUNTER — Encounter: Payer: Self-pay | Admitting: Internal Medicine

## 2017-10-18 VITALS — BP 110/60 | HR 70 | Temp 98.7°F | Wt 199.4 lb

## 2017-10-18 DIAGNOSIS — G8929 Other chronic pain: Secondary | ICD-10-CM | POA: Diagnosis not present

## 2017-10-18 DIAGNOSIS — M545 Low back pain: Secondary | ICD-10-CM | POA: Diagnosis not present

## 2017-10-18 MED ORDER — HYDROCODONE-ACETAMINOPHEN 10-325 MG PO TABS: 1.0000 | ORAL_TABLET | Freq: Four times a day (QID) | ORAL | 0 refills | Status: DC | PRN

## 2017-10-18 MED ORDER — FUROSEMIDE 40 MG PO TABS: 20.0000 mg | ORAL_TABLET | Freq: Every day | ORAL | 2 refills | Status: DC

## 2017-10-18 MED ORDER — LACTULOSE 10 GM/15ML PO SOLN: ORAL | 4 refills | Status: DC

## 2017-10-18 NOTE — Patient Instructions (Signed)
Return in 3 months for medical follow-up and to establish with a new provider  Cardiology follow-up as scheduled

## 2017-10-18 NOTE — Progress Notes (Signed)
   Subjective:    Patient ID: Miranda [name], female    DOB: 1945-05-22, 72 y.o.   MRN: [id number]  HPI  72 year old patient who is seen today for management per opioid protocol.  [place] printed, initialed  and scanned into the EMR.   Indication for chronic opioid: Patient has a long history of chronic low back pain secondary to DJD.  She has been seen by Dr. Nelva Bush in the past. Medication and dose: She remains on hydrocodone 10 mg every 6 hours as needed for low back pain.  She states that she has decreased the frequency of hydrocodone use but review of her total prescriptions over the last few months did not confirm this.  Over the past 3 months she has had 3 refills of 90 tablets each # pills per month: Limited to #90 Last UDS date: April 08, 2017 Opioid Treatment Agreement signed (Y/N): Yes Opioid Treatment Agreement last reviewed with patient:   April 08, 2017 [place] reviewed this encounter (include red flags):  Yes.  Overdose risk score 170  Review of Systems     Objective:   Physical Exam        Assessment & Plan:   Encounter for chronic pain management (P91.50) Narcotic use  (711.90)  Marletta Lor

## 2017-10-19 ENCOUNTER — Other Ambulatory Visit: Payer: Self-pay

## 2017-10-19 ENCOUNTER — Ambulatory Visit (HOSPITAL_COMMUNITY): Payer: Medicare Other | Attending: Cardiology

## 2017-10-19 DIAGNOSIS — I4891 Unspecified atrial fibrillation: Secondary | ICD-10-CM | POA: Insufficient documentation

## 2017-10-19 DIAGNOSIS — N189 Chronic kidney disease, unspecified: Secondary | ICD-10-CM | POA: Diagnosis not present

## 2017-10-19 DIAGNOSIS — E119 Type 2 diabetes mellitus without complications: Secondary | ICD-10-CM | POA: Insufficient documentation

## 2017-10-19 DIAGNOSIS — I480 Paroxysmal atrial fibrillation: Secondary | ICD-10-CM | POA: Diagnosis not present

## 2017-10-19 DIAGNOSIS — E785 Hyperlipidemia, unspecified: Secondary | ICD-10-CM | POA: Insufficient documentation

## 2017-10-19 DIAGNOSIS — R011 Cardiac murmur, unspecified: Secondary | ICD-10-CM | POA: Diagnosis not present

## 2017-10-19 DIAGNOSIS — I129 Hypertensive chronic kidney disease with stage 1 through stage 4 chronic kidney disease, or unspecified chronic kidney disease: Secondary | ICD-10-CM | POA: Diagnosis not present

## 2017-10-19 DIAGNOSIS — R0602 Shortness of breath: Secondary | ICD-10-CM | POA: Diagnosis not present

## 2017-10-28 DIAGNOSIS — G4733 Obstructive sleep apnea (adult) (pediatric): Secondary | ICD-10-CM | POA: Diagnosis not present

## 2017-11-02 ENCOUNTER — Other Ambulatory Visit: Payer: Self-pay

## 2017-11-02 ENCOUNTER — Observation Stay (HOSPITAL_COMMUNITY)
Admission: EM | Admit: 2017-11-02 | Discharge: 2017-11-04 | Disposition: A | Discharge status: Home / Self Care | Payer: Medicare Other | Attending: Internal Medicine | Admitting: Internal Medicine

## 2017-11-02 ENCOUNTER — Emergency Department (HOSPITAL_COMMUNITY): Payer: Medicare Other

## 2017-11-02 ENCOUNTER — Encounter (HOSPITAL_COMMUNITY): Payer: Self-pay | Admitting: Emergency Medicine

## 2017-11-02 DIAGNOSIS — I509 Heart failure, unspecified: Secondary | ICD-10-CM | POA: Insufficient documentation

## 2017-11-02 DIAGNOSIS — E119 Type 2 diabetes mellitus without complications: Secondary | ICD-10-CM

## 2017-11-02 DIAGNOSIS — J45909 Unspecified asthma, uncomplicated: Secondary | ICD-10-CM | POA: Diagnosis not present

## 2017-11-02 DIAGNOSIS — R0789 Other chest pain: Secondary | ICD-10-CM | POA: Diagnosis not present

## 2017-11-02 DIAGNOSIS — I4891 Unspecified atrial fibrillation: Principal | ICD-10-CM | POA: Insufficient documentation

## 2017-11-02 DIAGNOSIS — R079 Chest pain, unspecified: Secondary | ICD-10-CM | POA: Diagnosis not present

## 2017-11-02 DIAGNOSIS — I130 Hypertensive heart and chronic kidney disease with heart failure and stage 1 through stage 4 chronic kidney disease, or unspecified chronic kidney disease: Secondary | ICD-10-CM | POA: Diagnosis not present

## 2017-11-02 DIAGNOSIS — Z79899 Other long term (current) drug therapy: Secondary | ICD-10-CM | POA: Insufficient documentation

## 2017-11-02 DIAGNOSIS — R52 Pain, unspecified: Secondary | ICD-10-CM

## 2017-11-02 DIAGNOSIS — Z7902 Long term (current) use of antithrombotics/antiplatelets: Secondary | ICD-10-CM | POA: Diagnosis not present

## 2017-11-02 DIAGNOSIS — J8410 Pulmonary fibrosis, unspecified: Secondary | ICD-10-CM | POA: Diagnosis not present

## 2017-11-02 DIAGNOSIS — R110 Nausea: Secondary | ICD-10-CM | POA: Diagnosis not present

## 2017-11-02 DIAGNOSIS — R000 Tachycardia, unspecified: Secondary | ICD-10-CM | POA: Diagnosis not present

## 2017-11-02 DIAGNOSIS — M19012 Primary osteoarthritis, left shoulder: Secondary | ICD-10-CM

## 2017-11-02 DIAGNOSIS — J9811 Atelectasis: Secondary | ICD-10-CM | POA: Diagnosis not present

## 2017-11-02 DIAGNOSIS — Z7984 Long term (current) use of oral hypoglycemic drugs: Secondary | ICD-10-CM | POA: Diagnosis not present

## 2017-11-02 DIAGNOSIS — E1122 Type 2 diabetes mellitus with diabetic chronic kidney disease: Secondary | ICD-10-CM | POA: Insufficient documentation

## 2017-11-02 DIAGNOSIS — I10 Essential (primary) hypertension: Secondary | ICD-10-CM | POA: Diagnosis present

## 2017-11-02 DIAGNOSIS — Z91040 Latex allergy status: Secondary | ICD-10-CM | POA: Diagnosis not present

## 2017-11-02 DIAGNOSIS — R0602 Shortness of breath: Secondary | ICD-10-CM | POA: Diagnosis not present

## 2017-11-02 DIAGNOSIS — N189 Chronic kidney disease, unspecified: Secondary | ICD-10-CM | POA: Insufficient documentation

## 2017-11-02 DIAGNOSIS — R072 Precordial pain: Secondary | ICD-10-CM | POA: Diagnosis present

## 2017-11-02 LAB — I-STAT CHEM 8, ED
BUN: 12 mg/dL (ref 8–23)
Calcium, Ion: 1.2 mmol/L (ref 1.15–1.40)
Chloride: 106 mmol/L (ref 98–111)
Creatinine, Ser: 0.6 mg/dL (ref 0.44–1.00)
Glucose, Bld: 139 mg/dL — ABNORMAL HIGH (ref 70–99)
HEMATOCRIT: 43 % (ref 36.0–46.0)
HEMOGLOBIN: 14.6 g/dL (ref 12.0–15.0)
Potassium: 3.3 mmol/L — ABNORMAL LOW (ref 3.5–5.1)
SODIUM: 145 mmol/L (ref 135–145)
TCO2: 23 mmol/L (ref 22–32)

## 2017-11-02 LAB — CBC WITH DIFFERENTIAL/PLATELET
Abs Immature Granulocytes: 0 10*3/uL (ref 0.0–0.1)
Basophils Absolute: 0 10*3/uL (ref 0.0–0.1)
Basophils Relative: 0 %
EOS PCT: 3 %
Eosinophils Absolute: 0.2 10*3/uL (ref 0.0–0.7)
HCT: 42.6 % (ref 36.0–46.0)
HEMOGLOBIN: 13.2 g/dL (ref 12.0–15.0)
IMMATURE GRANULOCYTES: 0 %
LYMPHS PCT: 39 %
Lymphs Abs: 2.7 10*3/uL (ref 0.7–4.0)
MCH: 27.6 pg (ref 26.0–34.0)
MCHC: 31 g/dL (ref 30.0–36.0)
MCV: 88.9 fL (ref 78.0–100.0)
Monocytes Absolute: 0.6 10*3/uL (ref 0.1–1.0)
Monocytes Relative: 9 %
NEUTROS PCT: 49 %
Neutro Abs: 3.3 10*3/uL (ref 1.7–7.7)
Platelets: 185 10*3/uL (ref 150–400)
RBC: 4.79 MIL/uL (ref 3.87–5.11)
RDW: 14.1 % (ref 11.5–15.5)
WBC: 6.8 10*3/uL (ref 4.0–10.5)

## 2017-11-02 LAB — I-STAT TROPONIN, ED: Troponin i, poc: 0 ng/mL (ref 0.00–0.08)

## 2017-11-02 MED ORDER — DILTIAZEM LOAD VIA INFUSION
10.0000 mg | Freq: Once | INTRAVENOUS | Status: AC
  Administered 2017-11-03: 10 mg via INTRAVENOUS
  Filled 2017-11-02: qty 10

## 2017-11-02 MED ORDER — DILTIAZEM HCL-DEXTROSE 100-5 MG/100ML-% IV SOLN (PREMIX)
5.0000 mg/h | INTRAVENOUS | Status: DC
  Administered 2017-11-03: 5 mg/h via INTRAVENOUS
  Filled 2017-11-02: qty 100

## 2017-11-02 NOTE — ED Notes (Signed)
ED Provider at bedside. 

## 2017-11-02 NOTE — ED Triage Notes (Signed)
Pt was getting ready for bed, sudden onset substernal chest pain that radiates to left arm.  Initially heart rate 180 - given total of 18 adenosin no change other than heart rate decreased to 130 upon arrival.  Pt also given 5 of metaprol and one nitro which decreased blood pressure from 160/92 to 93/63, started on IV fluids.  Pt went from a 9/10 to 6/10.  Not given ASA due to allergies

## 2017-11-03 ENCOUNTER — Observation Stay (HOSPITAL_COMMUNITY): Payer: Medicare Other

## 2017-11-03 ENCOUNTER — Emergency Department (HOSPITAL_COMMUNITY): Payer: Medicare Other

## 2017-11-03 ENCOUNTER — Other Ambulatory Visit: Payer: Self-pay

## 2017-11-03 ENCOUNTER — Encounter (HOSPITAL_COMMUNITY): Payer: Self-pay | Admitting: Emergency Medicine

## 2017-11-03 DIAGNOSIS — R079 Chest pain, unspecified: Secondary | ICD-10-CM

## 2017-11-03 DIAGNOSIS — J8410 Pulmonary fibrosis, unspecified: Secondary | ICD-10-CM | POA: Diagnosis not present

## 2017-11-03 DIAGNOSIS — I4891 Unspecified atrial fibrillation: Secondary | ICD-10-CM

## 2017-11-03 DIAGNOSIS — E119 Type 2 diabetes mellitus without complications: Secondary | ICD-10-CM | POA: Diagnosis not present

## 2017-11-03 DIAGNOSIS — M19012 Primary osteoarthritis, left shoulder: Secondary | ICD-10-CM | POA: Diagnosis not present

## 2017-11-03 DIAGNOSIS — I10 Essential (primary) hypertension: Secondary | ICD-10-CM | POA: Diagnosis not present

## 2017-11-03 LAB — TROPONIN I
Troponin I: 0.03 ng/mL (ref <0.03)
Troponin I: <0.03 ng/mL (ref <0.03)
Troponin I: <0.03 ng/mL (ref <0.03)

## 2017-11-03 LAB — GLUCOSE, CAPILLARY: GLUCOSE-CAPILLARY: 141 mg/dL — AB (ref 70–99)

## 2017-11-03 LAB — MRSA PCR SCREENING: MRSA BY PCR: NEGATIVE

## 2017-11-03 MED ORDER — AMLODIPINE BESYLATE 5 MG PO TABS: 10.0000 mg | ORAL_TABLET | Freq: Every day | ORAL | Status: DC

## 2017-11-03 MED ORDER — RIVAROXABAN 20 MG PO TABS
20.0000 mg | ORAL_TABLET | Freq: Every day | ORAL | Status: DC
  Administered 2017-11-03: 20 mg via ORAL
  Filled 2017-11-03 (×2): qty 1

## 2017-11-03 MED ORDER — FUROSEMIDE 20 MG PO TABS
20.0000 mg | ORAL_TABLET | Freq: Every day | ORAL | Status: DC
  Administered 2017-11-03 – 2017-11-04 (×2): 20 mg via ORAL
  Filled 2017-11-03 (×2): qty 1

## 2017-11-03 MED ORDER — POTASSIUM CHLORIDE CRYS ER 20 MEQ PO TBCR
40.0000 meq | EXTENDED_RELEASE_TABLET | Freq: Once | ORAL | Status: AC
  Administered 2017-11-03: 40 meq via ORAL
  Filled 2017-11-03: qty 2

## 2017-11-03 MED ORDER — HYDROCODONE-ACETAMINOPHEN 10-325 MG PO TABS: 1.0000 | ORAL_TABLET | Freq: Four times a day (QID) | ORAL | Status: DC | PRN

## 2017-11-03 MED ORDER — MORPHINE SULFATE (PF) 2 MG/ML IV SOLN
2.0000 mg | Freq: Once | INTRAVENOUS | Status: AC
  Administered 2017-11-03: 2 mg via INTRAVENOUS
  Filled 2017-11-03: qty 1

## 2017-11-03 MED ORDER — ACETAMINOPHEN 325 MG PO TABS: 650.0000 mg | ORAL_TABLET | ORAL | Status: DC | PRN

## 2017-11-03 MED ORDER — CYCLOBENZAPRINE HCL 10 MG PO TABS
10.0000 mg | ORAL_TABLET | Freq: Three times a day (TID) | ORAL | Status: DC | PRN
  Administered 2017-11-03: 10 mg via ORAL
  Filled 2017-11-03: qty 1

## 2017-11-03 MED ORDER — METFORMIN HCL ER 500 MG PO TB24
1000.0000 mg | ORAL_TABLET | Freq: Every day | ORAL | Status: DC
  Administered 2017-11-03 – 2017-11-04 (×2): 1000 mg via ORAL
  Filled 2017-11-03 (×4): qty 2

## 2017-11-03 MED ORDER — GI COCKTAIL ~~LOC~~
30.0000 mL | Freq: Once | ORAL | Status: AC
  Administered 2017-11-03: 30 mL via ORAL
  Filled 2017-11-03: qty 30

## 2017-11-03 MED ORDER — DILTIAZEM HCL-DEXTROSE 100-5 MG/100ML-% IV SOLN (PREMIX)
5.0000 mg/h | INTRAVENOUS | Status: DC
  Administered 2017-11-03: 5 mg/h via INTRAVENOUS

## 2017-11-03 MED ORDER — ALPRAZOLAM 0.25 MG PO TABS: 0.2500 mg | ORAL_TABLET | Freq: Three times a day (TID) | ORAL | Status: DC | PRN

## 2017-11-03 MED ORDER — ONDANSETRON HCL 4 MG/2ML IJ SOLN: 4.0000 mg | Freq: Four times a day (QID) | INTRAMUSCULAR | Status: DC | PRN

## 2017-11-03 MED ORDER — LOSARTAN POTASSIUM 50 MG PO TABS
100.0000 mg | ORAL_TABLET | Freq: Every day | ORAL | Status: DC
  Administered 2017-11-03 – 2017-11-04 (×2): 100 mg via ORAL
  Filled 2017-11-03 (×3): qty 2

## 2017-11-03 MED ORDER — FLUTICASONE PROPIONATE 50 MCG/ACT NA SUSP: 1.0000 | Freq: Every day | NASAL | Status: DC | PRN

## 2017-11-03 MED ORDER — PIRFENIDONE 801 MG PO TABS: 1.0000 | ORAL_TABLET | Freq: Three times a day (TID) | ORAL | Status: DC

## 2017-11-03 MED ORDER — METOPROLOL TARTRATE 25 MG PO TABS
25.0000 mg | ORAL_TABLET | Freq: Two times a day (BID) | ORAL | Status: DC
  Administered 2017-11-03 – 2017-11-04 (×3): 25 mg via ORAL
  Filled 2017-11-03 (×3): qty 1

## 2017-11-03 MED ORDER — ATORVASTATIN CALCIUM 20 MG PO TABS
20.0000 mg | ORAL_TABLET | Freq: Every day | ORAL | Status: DC
  Administered 2017-11-03 – 2017-11-04 (×2): 20 mg via ORAL
  Filled 2017-11-03 (×2): qty 1

## 2017-11-03 NOTE — ED Notes (Signed)
Patient transported to CT 

## 2017-11-03 NOTE — ED Notes (Signed)
Pt used bedside comode Call light within reach Alert

## 2017-11-03 NOTE — ED Notes (Signed)
Patient complained of inability to move left hand. No neuro deficits, informed Dr. Alcario Drought.

## 2017-11-03 NOTE — ED Notes (Signed)
Notified provider about the troponin results.

## 2017-11-03 NOTE — H&P (Addendum)
History and Physical    Miranda [name] YKZ:[id number] DOB: 1945/06/25 DOA: 11/02/2017  PCP: Marletta Lor, MD  Patient coming from: Home  I have personally briefly reviewed patient's old medical records in [place]  Chief Complaint: Chest pain  HPI: Miranda [name] is a 72 y.o. female with medical history significant of PAF new diagnosis in July, DM2, HTN, PE, on Xarelto chronically.  Patient was getting ready for bed, when she had sudden onset substernal chest pain with radiation to L arm.  Ems called.  Initial HR 180.  Total of 18 adenosine given with no change other than HR down to 130 on arrival.   ED Course: Given 5 metoprolol and one SL ntg, but the NTG dropped her BP from 160/92 to 93/63.  Put on cardizem gtt, HR now down to 90, and CP has gone from 9/10 to 6/10.  No ASA due to allergies.   Review of Systems: As per HPI otherwise 10 point review of systems negative.   Past Medical History:  Diagnosis Date  . Anxiety   . Arthritis   . Asthma   . Atrial fibrillation ([place])   . CHF (congestive heart failure) ([place])    pt. unsure- but thinks she was hosp. for CHF- 2002  . [facility]    [facility]  . Clotting disorder ([place])    blood clots in lungs/PE pulmonary embolism  . Colon polyps   . Complication of anesthesia    states requires a lot med. to put her to sleep   . DDD (degenerative disc disease) 09/17/2011  . Depression    "sometimes "  . DM (diabetes mellitus) ([place])   . Family history of anesthesia complication   . GERD (gastroesophageal reflux disease)   . Glaucoma    bilateral, pt. admits that she is noncompliant to eye gtts.   . Hemorrhoids   . Hyperlipidemia   . Hypertension    had stress, echo- 2006 /w [place], Cardiac Cath, per pt. 2002, echo repeated 2012- wnl   . Low back pain   . Shortness of breath   . Sleep apnea    uses c-pap- q night recently    Past Surgical History:  Procedure Laterality Date  .  ABDOMINAL HYSTERECTOMY     ectopic, fibroids  . arm surgery Right   . CARDIAC CATHETERIZATION    . CATARACT EXTRACTION, BILATERAL     cataracts removed bilateral- ?IOL  . COLONOSCOPY     remote  . FISSURECTOMY  10/08/2011   Procedure: FISSURECTOMY;  Surgeon: Stark Klein, MD;  Location: [facility];  Service: General;  Laterality: N/A;  . FLEXIBLE SIGMOIDOSCOPY  02/25/2011   Procedure: FLEXIBLE SIGMOIDOSCOPY;  Surgeon: Inda Castle, MD;  Location: [facility];  Service: Endoscopy;  Laterality: N/A;  . FOOT SURGERY     bilat, heel spurs- screw in R foot   . HEMORRHOID SURGERY  10/08/2011   Procedure: HEMORRHOIDECTOMY;  Surgeon: Stark Klein, MD;  Location: [facility];  Service: General;  Laterality: N/A;  External   . SPHINCTEROTOMY  10/08/2011   Procedure: Joan Mayans;  Surgeon: Stark Klein, MD;  Location: [facility];  Service: General;  Laterality: N/A;     reports that she has never smoked. She has never used smokeless tobacco. She reports that she does not drink alcohol or use drugs.  Allergies  Allergen Reactions  . Fish Oil Anaphylaxis  . Other Hives, Shortness Of Breath and Swelling    Allergic  to cashew nuts and peanut oil.  Marland Kitchen Penicillins Anaphylaxis, Hives and Swelling    Has patient had a PCN reaction causing immediate rash, facial/tongue/throat swelling, SOB or lightheadedness with hypotension: Face swelling and hives started first, then swelling of the throat  Has patient had a PCN reaction causing severe rash involving mucus membranes or skin necrosis: Yes  Has patient had a PCN reaction that required hospitalization: No  Has patient had a PCN reaction occurring within the last 10 years: Yes  If all of the above answers are "NO", then may proceed with Cephalospor  . Pneumococcal Vaccines Nausea And Vomiting  . Nitrofurantoin Macrocrystal Hives  . Aspirin Itching and Rash  . Bactrim [Sulfamethoxazole-Trimethoprim] Hives, Itching and Rash  . Ciprofloxacin Hives, Itching and Rash    . Ibuprofen Rash  . Influenza Vaccines Hives  . Ivp Dye [Iodinated Diagnostic Agents] Hives, Itching and Rash    Gives benadryl to counteract symptoms  . Latex Rash  . Macrobid [Nitrofurantoin Monohydrate Macrocrystals] Hives  . Shellfish Allergy Hives    Patient also allergic to seafood    Family History  Problem Relation Age of Onset  . Heart attack Mother   . Heart disease Mother   . Breast cancer Mother   . Emphysema Sister   . Breast cancer Sister   . Arthritis/Rheumatoid Sister   . Asthma Sister   . Lung cancer Sister   . COPD Sister   . Colon cancer Brother   . Colon cancer Brother   . Anesthesia problems Neg Hx      Prior to Admission medications   Medication Sig Start Date End Date Taking? Authorizing Provider  ALPRAZolam (XANAX) 0.25 MG tablet Take 1 tablet (0.25 mg total) by mouth 3 (three) times daily as needed for anxiety or sleep. 09/22/17   Marletta Lor, MD  AMBULATORY NON FORMULARY MEDICATION Nitroglycerine ointment 0.125 %  Apply a pea sized amount internally THREE times daily. Dispense 30 GM zero refill 07/29/17   [name], MD  amLODipine (NORVASC) 10 MG tablet Take 1 tablet (10 mg total) by mouth daily. 04/20/16   Marletta Lor, MD  atorvastatin (LIPITOR) 20 MG tablet Take 1 tablet (20 mg total) by mouth daily. 03/23/17   Marletta Lor, MD  cyclobenzaprine (FLEXERIL) 10 MG tablet Take 1 tablet (10 mg total) by mouth 3 (three) times daily as needed. 11/09/16   Marletta Lor, MD  fluticasone (FLONASE) 50 MCG/ACT nasal spray Place 1 spray into both nostrils daily. 06/07/17   Billie Ruddy, MD  furosemide (LASIX) 40 MG tablet Take 0.5 tablets (20 mg total) by mouth daily. 10/18/17   Marletta Lor, MD  HYDROcodone-acetaminophen [facility]) 10-325 MG tablet Take 1 tablet by mouth every 6 (six) hours as needed for moderate pain. Refill 1 month 10/18/17   Marletta Lor, MD  lactulose (CONSTULOSE) 10 GM/15ML solution TAKE 30  MILLILITERS THREE TIMES DAILY BY  MOUTH AS NEEDED FOR MILD CONSTIPATION 10/18/17   Marletta Lor, MD  losartan (COZAAR) 100 MG tablet Take 1 tablet (100 mg total) by mouth daily. 12/07/16   Marletta Lor, MD  metFORMIN (GLUCOPHAGE-XR) 500 MG 24 hr tablet Take 2 tablets (1,000 mg total) by mouth daily with breakfast. 04/08/17   Marletta Lor, MD  metoprolol tartrate (LOPRESSOR) 25 MG tablet Take 1 tablet (25 mg total) by mouth 2 (two) times daily. 12/07/16   Marletta Lor, MD  Pirfenidone (ESBRIET) 801 MG TABS  Take 1 tablet by mouth 3 (three) times daily. 03/29/17   Juanito Doom, MD  rivaroxaban (XARELTO) 20 MG TABS tablet Take 1 tablet (20 mg total) by mouth daily with supper. 09/22/17   Marletta Lor, MD    Physical Exam: Vitals:   11/02/17 2330 11/03/17 0000 11/03/17 0030 11/03/17 0115  BP: 134/79 107/69 122/64 117/76  Pulse: (!) 119 (!) 112 (!) 111 89  Resp: 20 18 (!) 25 (!) 25  SpO2: 98% 98% 98% 97%    Constitutional: NAD, calm, comfortable Eyes: PERRL, lids and conjunctivae normal ENMT: Mucous membranes are moist. Posterior pharynx clear of any exudate or lesions.Normal dentition.  Neck: normal, supple, no masses, no thyromegaly Respiratory: clear to auscultation bilaterally, no wheezing, no crackles. Normal respiratory effort. No accessory muscle use.  Cardiovascular: Irr, irr Abdomen: no tenderness, no masses palpated. No hepatosplenomegaly. Bowel sounds positive.  Musculoskeletal: L arm movement limited by pain, patient says this has happened before. Skin: no rashes, lesions, ulcers. No induration Neurologic: CN 2-12 grossly intact. Sensation intact, DTR normal. Strength 5/5 in all 4.  Psychiatric: Normal judgment and insight. Alert and oriented x 3. Normal mood.    Labs on Admission: I have personally reviewed following labs and imaging studies  CBC: Recent Labs  Lab 11/02/17 2304 11/02/17 2329  WBC 6.8  --   NEUTROABS 3.3  --   HGB  13.2 14.6  HCT 42.6 43.0  MCV 88.9  --   PLT 185  --    Basic Metabolic Panel: Recent Labs  Lab 11/02/17 2329  NA 145  K 3.3*  CL 106  GLUCOSE 139*  BUN 12  CREATININE 0.60   GFR: CrCl cannot be calculated (Unknown ideal weight.). Liver Function Tests: No results for input(s): AST, ALT, ALKPHOS, BILITOT, PROT, ALBUMIN in the last 168 hours. No results for input(s): LIPASE, AMYLASE in the last 168 hours. No results for input(s): AMMONIA in the last 168 hours. Coagulation Profile: No results for input(s): INR, PROTIME in the last 168 hours. Cardiac Enzymes: No results for input(s): CKTOTAL, CKMB, CKMBINDEX, TROPONINI in the last 168 hours. BNP (last 3 results) No results for input(s): PROBNP in the last 8760 hours. HbA1C: No results for input(s): HGBA1C in the last 72 hours. CBG: No results for input(s): GLUCAP in the last 168 hours. Lipid Profile: No results for input(s): CHOL, HDL, LDLCALC, TRIG, CHOLHDL, LDLDIRECT in the last 72 hours. Thyroid Function Tests: No results for input(s): TSH, T4TOTAL, FREET4, T3FREE, THYROIDAB in the last 72 hours. Anemia Panel: No results for input(s): VITAMINB12, FOLATE, FERRITIN, TIBC, IRON, RETICCTPCT in the last 72 hours. Urine analysis:    Component Value Date/Time   COLORURINE YELLOW 04/16/2015 [place] 04/16/2015 1558   LABSPEC 1.011 04/16/2015 1558   PHURINE 6.0 04/16/2015 1558   GLUCOSEU NEGATIVE 04/16/2015 1558   HGBUR TRACE (A) 04/16/2015 1558   HGBUR trace-lysed 03/26/2010 0831   BILIRUBINUR NEGATIVE 04/16/2015 1558   BILIRUBINUR negative 07/07/2013 1125   KETONESUR NEGATIVE 04/16/2015 1558   PROTEINUR NEGATIVE 04/16/2015 1558   UROBILINOGEN 0.2 07/07/2013 1125   UROBILINOGEN 0.2 09/14/2011 1133   NITRITE NEGATIVE 04/16/2015 1558   LEUKOCYTESUR SMALL (A) 04/16/2015 1558    Radiological Exams on Admission: Ct Chest Wo Contrast  Result Date: 11/03/2017 CLINICAL DATA:  Substernal chest pain elevated  heart rate EXAM: CT CHEST WITHOUT CONTRAST TECHNIQUE: Multidetector CT imaging of the chest was performed following the standard protocol without IV contrast. COMPARISON:  Chest  x-ray 11/12/2016 CT chest 10/04/2017 FINDINGS: Cardiovascular: Limited without intravenous contrast. Nonaneurysmal aorta. Moderate aortic atherosclerosis. Normal heart size. No pericardial effusion Mediastinum/Nodes: Midline trachea. No thyroid mass. No significantly enlarged lymph nodes. Esophagus within normal limits Lungs/Pleura: Mild bronchiectasis in the medial upper lobes. Mild subpleural fibrosis. Mild bronchiectasis and fibrosis in the left lung base. No acute opacity or pleural effusion Upper Abdomen: No acute abnormality. Musculoskeletal: No chest wall mass or suspicious bone lesions identified. IMPRESSION: 1. Mild pulmonary fibrosis, without significant change. No acute pulmonary infiltrate. Negative for pneumothorax or acute abnormality allowing for absence of contrast. Aortic Atherosclerosis (ICD10-I70.0). Electronically Signed   By: Donavan Foil M.D.   On: 11/03/2017 01:31   Dg Chest Portable 1 View  Result Date: 11/02/2017 CLINICAL DATA:  Atrial fibrillation and dyspnea. EXAM: PORTABLE CHEST 1 VIEW COMPARISON:  09/14/2017 CXR and chest CT FINDINGS: Low lung volumes. Mild cardiac enlargement with aortic atherosclerosis. Bibasilar atelectasis is noted. Slightly more confluent opacities at the left lung base cannot exclude a superimposed focus of pneumonia. No effusion or pneumothorax. Osteoarthritis of the [facility] and glenohumeral joints bilaterally. IMPRESSION: 1. Low lung volumes with borderline cardiomegaly and aortic atherosclerosis. 2. Bibasilar atelectasis is noted with slightly more confluent airspace opacities at the left lung base that cannot exclude a small focus of pneumonia. Electronically Signed   By: [name] Royalty M.D.   On: 11/02/2017 23:29    EKG: Independently reviewed. J point elevation throughout, Dr. Randal Buba  and I dont feel that this represents STEMI however.  J point elevation was present on previous EKGs as well.  Assessment/Plan Principal Problem:   Atrial fibrillation with RVR ([facility]) Active Problems:   HTN (hypertension)   Diabetes mellitus without complication ([place])    1. A.Fib RVR - 1. A.Fib pathway 2. Rate controlled now on cardizem gtt 3. Tele monitor 4. Serial trops given ongoing chest 'pressure' symptoms. 5. Continue Xarelto 6. Consider cards eval in AM 2. HTN - 1. Continue home BP meds, other than amlodipine which we will hold since we are doing cardizem. 2. Note, massive BP drop with NTG, may wish to consider cutting some of her 4 BP meds and just adding Imdur in future. 3. DM2 - continue metformin  DVT prophylaxis: Xarelto Code Status: Full Family Communication: No family in room Disposition Plan: Home after admit Consults called: None Admission status: Place in obs   Jaylyn Booher, [facility] Pager [phone] Only works nights!  If 7AM-7PM, please contact the primary day team physician taking care of patient  [email or web address] Password [email or web address]  11/03/2017, 1:35 AM

## 2017-11-03 NOTE — ED Notes (Signed)
Assisted patient x 1 to bedside commode. She reports it is hard to use her L arm to help her stand and sit, but is able to move it without difficulty. Patient now sitting up in bed, eating breakfast. Denies CP or SOB. Continues to endorse L arm feeling sore with movement.

## 2017-11-03 NOTE — ED Notes (Addendum)
Diltiazem drip stopped, pt HR now in the upper 50's. Informed provider. Will continue to monitor.

## 2017-11-03 NOTE — Consult Note (Addendum)
Cardiology Consultation:   Patient ID: Miranda [name]; [id number]; [birth year]   Admit date: 11/02/2017 Date of Consult: 11/03/2017  Primary Care Provider: Marletta Lor, MD Primary Cardiologist: [name], MD  Primary Electrophysiologist:  NA   Patient Profile:   Miranda [name] is a 72 y.o. female with a hx of HTN, HLD, GERD, OSA on CPAP, DM-2, bipolar disease and a fib who is being seen today for the evaluation of a fib at the request of Dr. Cruzita Lederer.  History of Present Illness:   Ms. Dicicco was seen in our office 10/12/17 for PAF.  On 09/14/17 during colonoscopy she went into a fib with RVR.  She was sent to ER and spontaneously converted there.  TSH was normal and troponin neg.  She was seen on the 30th and echo ordered.  We could not increase BB due to HR of 51.   Hx of bradycardia to 30s on BB.   Other cardiac hx.   Cardiac cath 2006 -normal coronary arteries, and normal LV wal motion;  Remote hx chest pain in 2012 and was atypical.  In 2016 nuc study was neg for ischemia and EF 69%.  Echo in 2018 with EF 60-65% G!DD, moderately dilated Lt atrium.    She had PE in 04/2016 and was on xarelto for 12 months. High-resolution CT obtained on 05/08/2016 suggested interstitial lung disease as well along with aortic atherosclerosis and two-vessel coronary artery disease.   Repeat CT angiogram of the chest in July 2019 showed resolution of PE.  No DVT. No hx of travel.  Idiopathic.  No connective tissue disease.  Echo 10/19/17 with EF 60-65%  G1DD.  Trivial MR, TR and PR, LA mildly dilated.  She was placed on Xarelto after a fib episode for CAH2DS2VASc of 4.  AGE, HTN, Female, DM.   Now admitted with chest pain 11/03/17 sudden onset with radiation to Lt arm. EMS called.  She was in a fib and HR 180, given adenosine 6 then 12 given with continued a fib but HR down to 130.  In ER  Given 5 mg IV lopressor and 1 SL ntg then IV dilt drip.   With sl NTG her BP dropped.    EKG a fib with RVR at  118 I personally reviewed. Similar to EKG with a fib in first of July.  All follow up EKGs similar though now rate controlled.  TELE:  I personally reviewed. Converted to SR around 4-5 AM   Na 145, K+ 3.3, Cr 0.60  Troponin poc 0.00; troponin I 0.03 and < 0.03  Hgb 13.2, Hct 42.6. plts 185.  Chest CT without contrast Mild pulmonary fibrosis, without significant change. No acute pulmonary infiltrate. Negative for pneumothorax or acute abnormality allowing for absence of contrast  Currently off dilt. Now in SR occ, chest pressure.  HR to 47 while awake -briefly.   Past Medical History:  Diagnosis Date  . Anxiety   . Arthritis   . Asthma   . Atrial fibrillation ([place])   . CHF (congestive heart failure) ([place])    pt. unsure- but thinks she was hosp. for CHF- 2002  . [facility] kidney disease    [facility]  . Clotting disorder ([place])    blood clots in lungs/PE pulmonary embolism  . Colon polyps   . Complication of anesthesia    states requires a lot med. to put her to sleep   . DDD (degenerative disc disease) 09/17/2011  . Depression    "  sometimes "  . DM (diabetes mellitus) ([place])   . Family history of anesthesia complication   . GERD (gastroesophageal reflux disease)   . Glaucoma    bilateral, pt. admits that she is noncompliant to eye gtts.   . Hemorrhoids   . Hyperlipidemia   . Hypertension    had stress, echo- 2006 /w [place], Cardiac Cath, per pt. 2002, echo repeated 2012- wnl   . Low back pain   . Shortness of breath   . Sleep apnea    uses c-pap- q night recently    Past Surgical History:  Procedure Laterality Date  . ABDOMINAL HYSTERECTOMY     ectopic, fibroids  . arm surgery Right   . CARDIAC CATHETERIZATION    . CATARACT EXTRACTION, BILATERAL     cataracts removed bilateral- ?IOL  . COLONOSCOPY     remote  . FISSURECTOMY  10/08/2011   Procedure: FISSURECTOMY;  Surgeon: Stark Klein, MD;  Location: [facility];  Service: General;  Laterality: N/A;  . FLEXIBLE  SIGMOIDOSCOPY  02/25/2011   Procedure: FLEXIBLE SIGMOIDOSCOPY;  Surgeon: Inda Castle, MD;  Location: [facility];  Service: Endoscopy;  Laterality: N/A;  . FOOT SURGERY     bilat, heel spurs- screw in R foot   . HEMORRHOID SURGERY  10/08/2011   Procedure: HEMORRHOIDECTOMY;  Surgeon: Stark Klein, MD;  Location: [facility];  Service: General;  Laterality: N/A;  External   . SPHINCTEROTOMY  10/08/2011   Procedure: Joan Mayans;  Surgeon: Stark Klein, MD;  Location: [facility];  Service: General;  Laterality: N/A;     Home Medications:  Prior to Admission medications   Medication Sig Start Date End Date Taking? Authorizing Provider  ALPRAZolam (XANAX) 0.25 MG tablet Take 1 tablet (0.25 mg total) by mouth 3 (three) times daily as needed for anxiety or sleep. 09/22/17  Yes Marletta Lor, MD  amLODipine (NORVASC) 10 MG tablet Take 1 tablet (10 mg total) by mouth daily. 04/20/16  Yes Marletta Lor, MD  atorvastatin (LIPITOR) 20 MG tablet Take 1 tablet (20 mg total) by mouth daily. 03/23/17  Yes Marletta Lor, MD  cyclobenzaprine (FLEXERIL) 10 MG tablet Take 1 tablet (10 mg total) by mouth 3 (three) times daily as needed. Patient taking differently: Take 10 mg by mouth 3 (three) times daily as needed for muscle spasms.  11/09/16  Yes Marletta Lor, MD  fluticasone [facility]) 50 MCG/ACT nasal spray Place 1 spray into both nostrils daily. Patient taking differently: Place 1 spray into both nostrils daily as needed for allergies.  06/07/17  Yes Billie Ruddy, MD  furosemide (LASIX) 40 MG tablet Take 0.5 tablets (20 mg total) by mouth daily. 10/18/17  Yes Marletta Lor, MD  HYDROcodone-acetaminophen [facility]) 10-325 MG tablet Take 1 tablet by mouth every 6 (six) hours as needed for moderate pain. Refill 1 month 10/18/17  Yes Marletta Lor, MD  lactulose (CONSTULOSE) 10 GM/15ML solution TAKE 30 MILLILITERS THREE TIMES DAILY BY  MOUTH AS NEEDED FOR MILD CONSTIPATION 10/18/17  Yes  Marletta Lor, MD  losartan (COZAAR) 100 MG tablet Take 1 tablet (100 mg total) by mouth daily. 12/07/16  Yes Marletta Lor, MD  metFORMIN (GLUCOPHAGE-XR) 500 MG 24 hr tablet Take 2 tablets (1,000 mg total) by mouth daily with breakfast. 04/08/17  Yes Marletta Lor, MD  metoprolol tartrate (LOPRESSOR) 25 MG tablet Take 1 tablet (25 mg total) by mouth 2 (two) times daily. 12/07/16  Yes Marletta Lor, MD  Pirfenidone (ESBRIET) 801 MG TABS Take 1 tablet by mouth 3 (three) times daily. 03/29/17  Yes Juanito Doom, MD  rivaroxaban (XARELTO) 20 MG TABS tablet Take 1 tablet (20 mg total) by mouth daily with supper. 09/22/17  Yes Marletta Lor, MD    Inpatient Medications: Scheduled Meds: . atorvastatin  20 mg Oral Daily  . furosemide  20 mg Oral Daily  . losartan  100 mg Oral Daily  . metFORMIN  1,000 mg Oral Q breakfast  . metoprolol tartrate  25 mg Oral BID  . Pirfenidone  1 tablet Oral TID  . rivaroxaban  20 mg Oral Q supper   Continuous Infusions: . diltiazem (CARDIZEM) infusion Stopped (11/03/17 0509)   PRN Meds: acetaminophen, ALPRAZolam, cyclobenzaprine, fluticasone, HYDROcodone-acetaminophen, ondansetron (ZOFRAN) IV  Allergies:    Allergies  Allergen Reactions  . Fish Oil Anaphylaxis  . Other Hives, Shortness Of Breath and Swelling    Allergic to cashew nuts and peanut oil.  Marland Kitchen Penicillins Anaphylaxis, Hives and Swelling    Has patient had a PCN reaction causing immediate rash, facial/tongue/throat swelling, SOB or lightheadedness with hypotension: Face swelling and hives started first, then swelling of the throat  Has patient had a PCN reaction causing severe rash involving mucus membranes or skin necrosis: Yes  Has patient had a PCN reaction that required hospitalization: No  Has patient had a PCN reaction occurring within the last 10 years: Yes  If all of the above answers are "NO", then may proceed with Cephalospor  . Pneumococcal Vaccines  Nausea And Vomiting  . Nitrofurantoin Macrocrystal Hives  . Aspirin Itching and Rash  . Bactrim [Sulfamethoxazole-Trimethoprim] Hives, Itching and Rash  . Ciprofloxacin Hives, Itching and Rash  . Ibuprofen Rash  . Influenza Vaccines Hives  . Ivp Dye [Iodinated Diagnostic Agents] Hives, Itching and Rash    Gives benadryl to counteract symptoms  . Latex Rash  . Macrobid [Nitrofurantoin Monohydrate Macrocrystals] Hives  . Shellfish Allergy Hives    Patient also allergic to seafood    Social History:   Social History   Socioeconomic History  . Marital status: Married    Spouse name: Not on file  . Number of children: 4  . Years of education: Not on file  . Highest education level: Not on file  Occupational History  . Occupation: retired Forensic psychologist: UNEMPLOYED  Social Needs  . Financial resource strain: Not on file  . Food insecurity:    Worry: Not on file    Inability: Not on file  . Transportation needs:    Medical: Not on file    Non-medical: Not on file  Tobacco Use  . Smoking status: Never Smoker  . Smokeless tobacco: Never Used  Substance and Sexual Activity  . Alcohol use: No    Alcohol/week: 0.0 standard drinks  . Drug use: No  . Sexual activity: Never  Lifestyle  . Physical activity:    Days per week: Not on file    Minutes per session: Not on file  . Stress: Not on file  Relationships  . Social connections:    Talks on phone: Not on file    Gets together: Not on file    Attends religious service: Not on file    Active member of club or organization: Not on file    Attends meetings of clubs or organizations: Not on file    Relationship status: Not on file  . Intimate partner violence:    Fear of  current or ex partner: Not on file    Emotionally abused: Not on file    Physically abused: Not on file    Forced sexual activity: Not on file  Other Topics Concern  . Not on file  Social History Narrative  . Not on file    Family History:    Family  History  Problem Relation Age of Onset  . Heart attack Mother   . Heart disease Mother   . Breast cancer Mother   . Emphysema Sister   . Breast cancer Sister   . Arthritis/Rheumatoid Sister   . Asthma Sister   . Lung cancer Sister   . COPD Sister   . Colon cancer Brother   . Colon cancer Brother   . Anesthesia problems Neg Hx      ROS:  Please see the history of present illness.  General:no colds or fevers, no weight changes Skin:no rashes or ulcers HEENT:no blurred vision, no congestion CV:see HPI PUL:see HPI GI:no diarrhea constipation or melena, no indigestion GU:no hematuria, no dysuria MS:no joint pain, no claudication Neuro:no syncope, no lightheadedness Endo:+ diabetes, no thyroid disease  All other ROS reviewed and negative.     Physical Exam/Data:   Vitals:   11/03/17 1047 11/03/17 1100 11/03/17 1159 11/03/17 1237  BP: 134/69 136/65 (!) 141/63 134/67  Pulse: 62  (!) 55 (!) 55  Resp:  17 14   Temp:    (!) 97.5 F (36.4 C)  TempSrc:    Oral  SpO2:  99% 97% 97%  Weight:    90.1 kg  Height:    5\' 5"  (1.651 m)    Intake/Output Summary (Last 24 hours) at 11/03/2017 1444 Last data filed at 11/03/2017 1129 Gross per 24 hour  Intake 120 ml  Output 825 ml  Net -705 ml   Filed Weights   11/03/17 1237  Weight: 90.1 kg   Body mass index is 33.07 kg/m.  General:  Well nourished, well developed, in no acute distress HEENT: normal Lymph: no adenopathy Neck: no JVD Endocrine:  No thryomegaly Vascular: No carotid bruits; pedal pulses 2+ bilaterally  Cardiac:  normal S1, S2; RRR; no murmur gallup, rub or click Lungs:  clear to auscultation bilaterally, no wheezing, rhonchi or rales  Abd: soft, nontender, no hepatomegaly  Ext: no edema Musculoskeletal:  No deformities, BUE and BLE strength normal and equal Skin: warm and dry  Neuro:  Alert and oriented X 3 MAE follows commands, no focal abnormalities noted Psych:  Normal affect    Relevant CV  Studies: Echo 10/19/17  Study Conclusions  - Left ventricle: The cavity size was normal. Systolic function was   normal. The estimated ejection fraction was in the range of 60%   to 65%. Wall motion was normal; there were no regional wall   motion abnormalities. Doppler parameters are consistent with   abnormal left ventricular relaxation (grade 1 diastolic   dysfunction). - Aortic valve: There was no significant regurgitation. - Mitral valve: There was trivial regurgitation. - Left atrium: The atrium was mildly dilated. - Right ventricle: The cavity size was mildly dilated. Wall   thickness was normal. - Tricuspid valve: There was trivial regurgitation. - Pulmonic valve: There was trivial regurgitation. - Inferior vena cava: The vessel was normal in size. The   respirophasic diameter changes were in the normal range (>= 50%),   consistent with normal central venous pressure.  Impressions:  - Normal LV systolic function. No significant valvular disease.  Laboratory  Data:  Chemistry Recent Labs  Lab 11/02/17 2329  NA 145  K 3.3*  CL 106  GLUCOSE 139*  BUN 12  CREATININE 0.60    No results for input(s): PROT, ALBUMIN, AST, ALT, ALKPHOS, BILITOT in the last 168 hours. Hematology Recent Labs  Lab 11/02/17 2304 11/02/17 2329  WBC 6.8  --   RBC 4.79  --   HGB 13.2 14.6  HCT 42.6 43.0  MCV 88.9  --   MCH 27.6  --   MCHC 31.0  --   RDW 14.1  --   PLT 185  --    Cardiac Enzymes Recent Labs  Lab 11/03/17 0452 11/03/17 1249  TROPONINI 0.03* <0.03    Recent Labs  Lab 11/02/17 2327  TROPIPOC 0.00    BNPNo results for input(s): BNP, PROBNP in the last 168 hours.  DDimer No results for input(s): DDIMER in the last 168 hours.  Radiology/Studies:  Ct Chest Wo Contrast  Result Date: 11/03/2017 CLINICAL DATA:  Substernal chest pain elevated heart rate EXAM: CT CHEST WITHOUT CONTRAST TECHNIQUE: Multidetector CT imaging of the chest was performed following the  standard protocol without IV contrast. COMPARISON:  Chest x-ray 11/12/2016 CT chest 10/04/2017 FINDINGS: Cardiovascular: Limited without intravenous contrast. Nonaneurysmal aorta. Moderate aortic atherosclerosis. Normal heart size. No pericardial effusion Mediastinum/Nodes: Midline trachea. No thyroid mass. No significantly enlarged lymph nodes. Esophagus within normal limits Lungs/Pleura: Mild bronchiectasis in the medial upper lobes. Mild subpleural fibrosis. Mild bronchiectasis and fibrosis in the left lung base. No acute opacity or pleural effusion Upper Abdomen: No acute abnormality. Musculoskeletal: No chest wall mass or suspicious bone lesions identified. IMPRESSION: 1. Mild pulmonary fibrosis, without significant change. No acute pulmonary infiltrate. Negative for pneumothorax or acute abnormality allowing for absence of contrast. Aortic Atherosclerosis (ICD10-I70.0). Electronically Signed   By: Donavan Foil M.D.   On: 11/03/2017 01:31   Dg Chest Portable 1 View  Result Date: 11/02/2017 CLINICAL DATA:  Atrial fibrillation and dyspnea. EXAM: PORTABLE CHEST 1 VIEW COMPARISON:  09/14/2017 CXR and chest CT FINDINGS: Low lung volumes. Mild cardiac enlargement with aortic atherosclerosis. Bibasilar atelectasis is noted. Slightly more confluent opacities at the left lung base cannot exclude a superimposed focus of pneumonia. No effusion or pneumothorax. Osteoarthritis of the [facility] and glenohumeral joints bilaterally. IMPRESSION: 1. Low lung volumes with borderline cardiomegaly and aortic atherosclerosis. 2. Bibasilar atelectasis is noted with slightly more confluent airspace opacities at the left lung base that cannot exclude a small focus of pneumonia. Electronically Signed   By: [name] Royalty M.D.   On: 11/02/2017 23:29   Dg Shoulder Left  Result Date: 11/03/2017 CLINICAL DATA:  Left shoulder pain EXAM: LEFT SHOULDER - 2+ VIEW COMPARISON:  09/05/2006 FINDINGS: Degenerative changes in the [facility] joint with joint  space narrowing and spurring. Glenohumeral joint is maintained. No acute bony abnormality. Specifically, no fracture, subluxation, or dislocation. Soft tissues are intact. IMPRESSION: Degenerative changes in the left AC joint. No acute bony abnormality. Electronically Signed   By: Rolm Baptise M.D.   On: 11/03/2017 08:20    Assessment and Plan:   1. Recurrent a fib with RVR.  Converted to SR and IV dilt has been stopped.  This is second episode of a fib.  She converted spontaneously in July.   She is on Xarelto that was started 09/22/17 for CHA2DS2Vasc of 4.   EF is normal.  Lt atrium mildly dilated.  Unable to increase BB due to bradycardia.     2.  Chest pain, neg MI, most likely due to atrial fib.  Same for pain down Lt arm.  She does have risk factors for CAD with diabetes, HTN, FH - exercise nuc study vs. Cardiac CT will defer to Dr. Acie Fredrickson.   3.     Hx of idiopathic PE 04/2016   4.     OSA on cpap at night.   5.     Sick sinus syndrome  Difficult to increase BB due to bradycardia.   Possible EP consult for ablation.  Possible wear 30 day event monitor for average of HR.   No stress test for now.  [place] for discharge and will arrange EP consult.   For questions or updates, please contact [place] Please consult [email or web address] for contact info under Cardiology/STEMI.   Signed, Cecilie Kicks, NP  11/03/2017 2:44 PM   Attending Note:   The patient was seen and examined.  Agree with assessment and plan as noted above.  Changes made to the above note as needed.  Patient seen and independently examined with Cecilie Kicks, NP.   We discussed all aspects of the encounter. I agree with the assessment and plan as stated above.  1.  Paroxysmal atrial fibrillation: Patient had another episode of atrial fibrillation.  She has already converted back to sinus rhythm and is feeling quite well.  continue Xarelto.  We can consider adding propranolol 10 mg to take on an as-needed basis if she goes  back into atrial fibrillation. Also refer her to the atrial fibrillation clinic.  2.  Chest pain: Most likely this is due to the atrial fibrillation.  Troponin is negative.  I think that she can go home today and she can get a stress test versus cardiac CT-we will defer to Dr. [name]      I have spent a total of 40 minutes with patient reviewing hospital  notes , telemetry, EKGs, labs and examining patient as well as establishing an assessment and plan that was discussed with the patient. > 50% of time was spent in direct patient care.    Thayer Headings, Brooke Bonito., MD, [facility] 11/03/2017, 4:53 PM [address]  [place] Pager [phone]

## 2017-11-03 NOTE — ED Notes (Signed)
Patient transported to X-ray 

## 2017-11-03 NOTE — ED Provider Notes (Signed)
[facility] EMERGENCY DEPARTMENT Provider Note   CSN: [id number] Arrival date & time: 11/02/17  2258     History   Chief Complaint Chief Complaint  Patient presents with  . Atrial Fibrillation    HPI Miranda [name] is a 72 y.o. female.  The history is provided by the patient.  Atrial Fibrillation  This is a recurrent problem. The current episode started 3 to 5 hours ago. The problem occurs constantly. The problem has not changed since onset.Associated symptoms include chest pain and shortness of breath. Pertinent negatives include no abdominal pain and no headaches. Nothing aggravates the symptoms. Nothing relieves the symptoms. She has tried nothing for the symptoms. The treatment provided no relief.    Past Medical History:  Diagnosis Date  . Anxiety   . Arthritis   . Asthma   . Atrial fibrillation ([place])   . CHF (congestive heart failure) ([place])    pt. unsure- but thinks she was hosp. for CHF- 2002  . [facility] kidney disease    [facility]  . Clotting disorder ([place])    blood clots in lungs/PE pulmonary embolism  . Colon polyps   . Complication of anesthesia    states requires a lot med. to put her to sleep   . DDD (degenerative disc disease) 09/17/2011  . Depression    "sometimes "  . DM (diabetes mellitus) ([place])   . Family history of anesthesia complication   . GERD (gastroesophageal reflux disease)   . Glaucoma    bilateral, pt. admits that she is noncompliant to eye gtts.   . Hemorrhoids   . Hyperlipidemia   . Hypertension    had stress, echo- 2006 /w [place], Cardiac Cath, per pt. 2002, echo repeated 2012- wnl   . Low back pain   . Shortness of breath   . Sleep apnea    uses c-pap- q night recently    Patient Active Problem List   Diagnosis Date Noted  . LVH (left ventricular hypertrophy) 09/22/2017  . Diabetes mellitus without complication ([place]) 75/17/0017  . Pulmonary emboli ([place]) 05/02/2016  . Right leg pain 05/01/2016    . ILD (interstitial lung disease) ([place]) 05/01/2016  . Bradycardia 05/01/2016  . Coronary artery calcification seen on CAT scan 08/06/2015  . UIP (usual interstitial pneumonitis) ([place]) 04/05/2015  . Osteoarthritis 09/17/2011  . Pyelonephritis with possible nonobstructing 5 mm calculus by renal ultrasound 09/16/2011  . OSA (obstructive sleep apnea) 06/10/2011  . Anal fissure 02/17/2011  . History of colonic polyps 04/03/2010  . INSOMNIA 09/28/2007  . Asthma 11/22/2006  . Dyslipidemia 08/19/2006  . HTN (hypertension) 08/19/2006  . GERD 08/19/2006  . LOW BACK PAIN 08/19/2006    Past Surgical History:  Procedure Laterality Date  . ABDOMINAL HYSTERECTOMY     ectopic, fibroids  . arm surgery Right   . CARDIAC CATHETERIZATION    . CATARACT EXTRACTION, BILATERAL     cataracts removed bilateral- ?IOL  . COLONOSCOPY     remote  . FISSURECTOMY  10/08/2011   Procedure: FISSURECTOMY;  Surgeon: Stark Klein, MD;  Location: [facility];  Service: General;  Laterality: N/A;  . FLEXIBLE SIGMOIDOSCOPY  02/25/2011   Procedure: FLEXIBLE SIGMOIDOSCOPY;  Surgeon: Inda Castle, MD;  Location: [facility];  Service: Endoscopy;  Laterality: N/A;  . FOOT SURGERY     bilat, heel spurs- screw in R foot   . HEMORRHOID SURGERY  10/08/2011   Procedure: HEMORRHOIDECTOMY;  Surgeon: Stark Klein, MD;  Location[facility]  OR;  Service: General;  Laterality: N/A;  External   . SPHINCTEROTOMY  10/08/2011   Procedure: SPHINCTEROTOMY;  Surgeon: Stark Klein, MD;  Location: [facility];  Service: General;  Laterality: N/A;     OB History   None      Home Medications    Prior to Admission medications   Medication Sig Start Date End Date Taking? Authorizing Provider  ALPRAZolam (XANAX) 0.25 MG tablet Take 1 tablet (0.25 mg total) by mouth 3 (three) times daily as needed for anxiety or sleep. 09/22/17   Marletta Lor, MD  AMBULATORY NON FORMULARY MEDICATION Nitroglycerine ointment 0.125 %  Apply a pea sized amount  internally THREE times daily. Dispense 30 GM zero refill 07/29/17   [name], MD  amLODipine (NORVASC) 10 MG tablet Take 1 tablet (10 mg total) by mouth daily. 04/20/16   Marletta Lor, MD  atorvastatin (LIPITOR) 20 MG tablet Take 1 tablet (20 mg total) by mouth daily. 03/23/17   Marletta Lor, MD  cyclobenzaprine (FLEXERIL) 10 MG tablet Take 1 tablet (10 mg total) by mouth 3 (three) times daily as needed. 11/09/16   Marletta Lor, MD  fluticasone (FLONASE) 50 MCG/ACT nasal spray Place 1 spray into both nostrils daily. 06/07/17   Billie Ruddy, MD  furosemide (LASIX) 40 MG tablet Take 0.5 tablets (20 mg total) by mouth daily. 10/18/17   Marletta Lor, MD  HYDROcodone-acetaminophen [facility]) 10-325 MG tablet Take 1 tablet by mouth every 6 (six) hours as needed for moderate pain. Refill 1 month 10/18/17   Marletta Lor, MD  lactulose (CONSTULOSE) 10 GM/15ML solution TAKE 30 MILLILITERS THREE TIMES DAILY BY  MOUTH AS NEEDED FOR MILD CONSTIPATION 10/18/17   Marletta Lor, MD  losartan (COZAAR) 100 MG tablet Take 1 tablet (100 mg total) by mouth daily. 12/07/16   Marletta Lor, MD  metFORMIN (GLUCOPHAGE-XR) 500 MG 24 hr tablet Take 2 tablets (1,000 mg total) by mouth daily with breakfast. 04/08/17   Marletta Lor, MD  metoprolol tartrate (LOPRESSOR) 25 MG tablet Take 1 tablet (25 mg total) by mouth 2 (two) times daily. 12/07/16   Marletta Lor, MD  Pirfenidone (ESBRIET) 801 MG TABS Take 1 tablet by mouth 3 (three) times daily. 03/29/17   Juanito Doom, MD  rivaroxaban (XARELTO) 20 MG TABS tablet Take 1 tablet (20 mg total) by mouth daily with supper. 09/22/17   Marletta Lor, MD    Family History Family History  Problem Relation Age of Onset  . Heart attack Mother   . Heart disease Mother   . Breast cancer Mother   . Emphysema Sister   . Breast cancer Sister   . Arthritis/Rheumatoid Sister   . Asthma Sister   . Lung cancer  Sister   . COPD Sister   . Colon cancer Brother   . Colon cancer Brother   . Anesthesia problems Neg Hx     Social History Social History   Tobacco Use  . Smoking status: Never Smoker  . Smokeless tobacco: Never Used  Substance Use Topics  . Alcohol use: No    Alcohol/week: 0.0 standard drinks  . Drug use: No     Allergies   Fish oil; Other; Penicillins; Pneumococcal vaccines; Nitrofurantoin macrocrystal; Aspirin; Bactrim [sulfamethoxazole-trimethoprim]; Ciprofloxacin; Ibuprofen; Influenza vaccines; Ivp dye [iodinated diagnostic agents]; Latex; Macrobid [nitrofurantoin monohydrate macrocrystals]; and Shellfish allergy   Review of Systems Review of Systems  Constitutional: Negative for chills and  fever.  HENT: Negative for congestion.   Respiratory: Positive for chest tightness and shortness of breath.   Cardiovascular: Positive for chest pain.  Gastrointestinal: Negative for abdominal pain.  Genitourinary: Negative for difficulty urinating.  Neurological: Negative for headaches.  All other systems reviewed and are negative.    Physical Exam Updated Vital Signs There were no vitals taken for this visit.  Physical Exam  Constitutional: She is oriented to person, place, and time. She appears well-developed and well-nourished.  HENT:  Head: Normocephalic and atraumatic.  Mouth/Throat: No oropharyngeal exudate.  Eyes: Pupils are equal, round, and reactive to light. Conjunctivae are normal.  Neck: Normal range of motion. Neck supple.  Cardiovascular: Normal heart sounds and intact distal pulses. An irregularly irregular rhythm present. Tachycardia present.  Pulmonary/Chest: Effort normal and breath sounds normal. No stridor. She has no wheezes. She has no rales.  Abdominal: Soft. Bowel sounds are normal. She exhibits no mass. There is no tenderness. There is no rebound and no guarding.  Musculoskeletal: Normal range of motion. She exhibits edema. She exhibits no  tenderness.  Neurological: She is alert and oriented to person, place, and time. She displays normal reflexes.  Skin: Skin is warm and dry. Capillary refill takes less than 2 seconds. She is not diaphoretic.  Psychiatric: She has a normal mood and affect.     ED Treatments / Results  Labs (all labs ordered are listed, but only abnormal results are displayed) Results for orders placed or performed during the hospital encounter of 11/02/17  CBC with Differential/Platelet  Result Value Ref Range   WBC 6.8 4.0 - 10.5 K/uL   RBC 4.79 3.87 - 5.11 MIL/uL   Hemoglobin 13.2 12.0 - 15.0 g/dL   HCT 42.6 36.0 - 46.0 %   MCV 88.9 78.0 - 100.0 fL   MCH 27.6 26.0 - 34.0 pg   MCHC 31.0 30.0 - 36.0 g/dL   RDW 14.1 11.5 - 15.5 %   Platelets 185 150 - 400 K/uL   Neutrophils Relative % 49 %   Neutro Abs 3.3 1.7 - 7.7 K/uL   Lymphocytes Relative 39 %   Lymphs Abs 2.7 0.7 - 4.0 K/uL   Monocytes Relative 9 %   Monocytes Absolute 0.6 0.1 - 1.0 K/uL   Eosinophils Relative 3 %   Eosinophils Absolute 0.2 0.0 - 0.7 K/uL   Basophils Relative 0 %   Basophils Absolute 0.0 0.0 - 0.1 K/uL   Immature Granulocytes 0 %   Abs Immature Granulocytes 0.0 0.0 - 0.1 K/uL  I-Stat Chem 8, ED  Result Value Ref Range   Sodium 145 135 - 145 mmol/L   Potassium 3.3 (L) 3.5 - 5.1 mmol/L   Chloride 106 98 - 111 mmol/L   BUN 12 8 - 23 mg/dL   Creatinine, Ser 0.60 0.44 - 1.00 mg/dL   Glucose, Bld 139 (H) 70 - 99 mg/dL   Calcium, Ion 1.20 1.15 - 1.40 mmol/L   TCO2 23 22 - 32 mmol/L   Hemoglobin 14.6 12.0 - 15.0 g/dL   HCT 43.0 36.0 - 46.0 %  I-stat troponin, ED  Result Value Ref Range   Troponin i, poc 0.00 0.00 - 0.08 ng/mL   Comment 3           Dg Chest Portable 1 View  Result Date: 11/02/2017 CLINICAL DATA:  Atrial fibrillation and dyspnea. EXAM: PORTABLE CHEST 1 VIEW COMPARISON:  09/14/2017 CXR and chest CT FINDINGS: Low lung volumes. Mild cardiac enlargement with aortic  atherosclerosis. Bibasilar atelectasis is  noted. Slightly more confluent opacities at the left lung base cannot exclude a superimposed focus of pneumonia. No effusion or pneumothorax. Osteoarthritis of the [facility] and glenohumeral joints bilaterally. IMPRESSION: 1. Low lung volumes with borderline cardiomegaly and aortic atherosclerosis. 2. Bibasilar atelectasis is noted with slightly more confluent airspace opacities at the left lung base that cannot exclude a small focus of pneumonia. Electronically Signed   By: [name] Royalty M.D.   On: 11/02/2017 23:29    EKG EKG Interpretation  Date/Time:  Tuesday November 02 2017 23:05:30 EDT Ventricular Rate:  118 PR Interval:    QRS Duration: 91 QT Interval:  315 QTC Calculation: 442 R Axis:   25 Text Interpretation:  Atrial fibrillation Minimal ST depression, lateral leads Confirmed by Randal Buba, Jermy Couper ([id number]) on 11/02/2017 11:26:29 PM   Radiology Dg Chest Portable 1 View  Result Date: 11/02/2017 CLINICAL DATA:  Atrial fibrillation and dyspnea. EXAM: PORTABLE CHEST 1 VIEW COMPARISON:  09/14/2017 CXR and chest CT FINDINGS: Low lung volumes. Mild cardiac enlargement with aortic atherosclerosis. Bibasilar atelectasis is noted. Slightly more confluent opacities at the left lung base cannot exclude a superimposed focus of pneumonia. No effusion or pneumothorax. Osteoarthritis of the [facility] and glenohumeral joints bilaterally. IMPRESSION: 1. Low lung volumes with borderline cardiomegaly and aortic atherosclerosis. 2. Bibasilar atelectasis is noted with slightly more confluent airspace opacities at the left lung base that cannot exclude a small focus of pneumonia. Electronically Signed   By: [name] Royalty M.D.   On: 11/02/2017 23:29    Procedures Procedures (including critical care time)  Medications Ordered in ED Medications  diltiazem (CARDIZEM) 1 mg/mL load via infusion 10 mg (10 mg Intravenous Bolus from Bag 11/03/17 0002)    And  diltiazem (CARDIZEM) 100 mg in dextrose 5% 118mL (1 mg/mL) infusion (5 mg/hr  Intravenous New Bag/Given 11/03/17 0001)     MDM Reviewed: previous chart and nursing note Reviewed previous: labs and ECG Interpretation: ECG, labs and x-ray (negative troponin,  atelectasis by me on cxr) Total time providing critical care: 30-74 minutes (diltiazem drip started by me ). This excludes time spent performing separately reportable procedures and services. Consults: admitting MD  CRITICAL CARE Performed by: Carlisle Beers Total critical care time: 60 minutes Critical care time was exclusive of separately billable procedures and treating other patients. Critical care was necessary to treat or prevent imminent or life-threatening deterioration. Critical care was time spent personally by me on the following activities: development of treatment plan with patient and/or surrogate as well as nursing, discussions with consultants, evaluation of patient's response to treatment, examination of patient, obtaining history from patient or surrogate, ordering and performing treatments and interventions, ordering and review of laboratory studies, ordering and review of radiographic studies, pulse oximetry and re-evaluation of patient's condition. Final Clinical Impressions(s) / ED Diagnoses   Admit for AFIB and CP rule out    Antoniette Peake, MD 11/03/17 5631

## 2017-11-03 NOTE — Progress Notes (Signed)
Patient seen and examined this morning, admitted overnight by Dr. Alcario Drought, H&P reviewed and agree with the A&P   Paroxysmal A. fib -Initially placed on Cardizem infusion, converted to sinus rhythm and has remained in sinus.  Historically based on outpatient notes bradycardia presents up titration of her AV nodal agents.  She did have a 5-second pause on telemetry review while in A. fib.  I have consulted cardiology, appreciate input -She is anticoagulated with Xarelto, continue metoprolol  Left shoulder stiffness/pain -Patient complains of inability to keep her left arm raised.  This is been going on for 2 weeks, per her has received a steroid shot.  She is tender to palpation and tender with range of motion movements.  Obtain plain x-ray, will need to be further follow-up as an outpatient  Hypertension -Continue Lasix, Cozaar, metoprolol  Hyperlipidemia -Continue statin  Type 2 diabetes mellitus -Continue metformin   Jack Mineau M. Cruzita Lederer, MD [facility] [phone]  If 7PM-7AM, please contact night-coverage [email or web address] Password [email or web address]

## 2017-11-04 ENCOUNTER — Other Ambulatory Visit: Payer: Self-pay | Admitting: Cardiology

## 2017-11-04 ENCOUNTER — Other Ambulatory Visit: Payer: Self-pay

## 2017-11-04 DIAGNOSIS — I10 Essential (primary) hypertension: Secondary | ICD-10-CM | POA: Diagnosis not present

## 2017-11-04 DIAGNOSIS — E119 Type 2 diabetes mellitus without complications: Secondary | ICD-10-CM

## 2017-11-04 DIAGNOSIS — I4891 Unspecified atrial fibrillation: Secondary | ICD-10-CM | POA: Diagnosis not present

## 2017-11-04 DIAGNOSIS — R079 Chest pain, unspecified: Secondary | ICD-10-CM

## 2017-11-04 LAB — BASIC METABOLIC PANEL
Anion gap: 8 (ref 5–15)
BUN: 6 mg/dL — ABNORMAL LOW (ref 8–23)
CHLORIDE: 108 mmol/L (ref 98–111)
CO2: 26 mmol/L (ref 22–32)
Calcium: 8.7 mg/dL — ABNORMAL LOW (ref 8.9–10.3)
Creatinine, Ser: 0.66 mg/dL (ref 0.44–1.00)
GFR calc non Af Amer: >60 mL/min (ref >60)
Glucose, Bld: 86 mg/dL (ref 70–99)
POTASSIUM: 4.1 mmol/L (ref 3.5–5.1)
SODIUM: 142 mmol/L (ref 135–145)

## 2017-11-04 MED ORDER — DICLOFENAC SODIUM 1 % TD GEL: 2.0000 g | Freq: Four times a day (QID) | TRANSDERMAL | 1 refills | Status: DC

## 2017-11-04 NOTE — Care Management Note (Signed)
Case Management Note  Patient Details  Name: EESHA SCHMALTZ MRN: [id number] Date of Birth: Dec 01, 1945  Subjective/Objective:  72 y.o. female with a hx of HTN, HLD, GERD, OSA on CPAP, DM-2, bipolar disease and a fib who is being seen today for the evaluation of afib.             Action/Plan: CM met with patient to discuss transitional needs and outpatient PT referral. Patient agreeable to a referral for left shoulder RA, with referral made to [place] [facility]. Patient lives at home, independent with no DME in use PTA. CM will sign off.       Expected Discharge Date:  11/04/17               Expected Discharge Plan:  Home/Self Care  In-House Referral:  NA  Discharge planning Services  CM Consult, Other - See comment(Outpatient PT referral)  Post Acute Care Choice:  NA Choice offered to:  NA  DME Arranged:  N/A DME Agency:  NA  HH Arranged:  NA HH Agency:  NA  Status of Service:  Completed, signed off  If discussed at [place] of Stay Meetings, dates discussed:    Additional Comments:  Midge Minium RN, BSN, NCM-BC, ACM-RN [phone] 11/04/2017, 10:05 AM

## 2017-11-04 NOTE — Care Management Obs Status (Signed)
[place] NOTIFICATION   Patient Details  Name: LAURISSA COWPER MRN: [id number] Date of Birth: [birth year]   Medicare Observation Status Notification Given:  Yes    Georgeanna Lea, RN 11/04/2017, 9:46 AM

## 2017-11-04 NOTE — Progress Notes (Addendum)
Progress Note  Patient Name: Miranda [name] Date of Encounter: 11/04/2017  Primary Cardiologist: Miranda Criado [name], MD  Subjective   Feeling well today. No chest pain, just left shoulder pain.   Inpatient Medications    Scheduled Meds: . atorvastatin  20 mg Oral Daily  . furosemide  20 mg Oral Daily  . losartan  100 mg Oral Daily  . metFORMIN  1,000 mg Oral Q breakfast  . metoprolol tartrate  25 mg Oral BID  . Pirfenidone  1 tablet Oral TID  . rivaroxaban  20 mg Oral Q supper   Continuous Infusions: . diltiazem (CARDIZEM) infusion Stopped (11/03/17 0509)   PRN Meds: acetaminophen, ALPRAZolam, cyclobenzaprine, fluticasone, HYDROcodone-acetaminophen, ondansetron (ZOFRAN) IV   Vital Signs    Vitals:   11/03/17 1701 11/03/17 2009 11/04/17 0527 11/04/17 0814  BP: (!) 131/109 (!) 143/59 (!) 112/93 (!) 168/76  Pulse:  (!) 58 63 (!) 57  Resp: 16 (!) 22 13   Temp: 98.4 F (36.9 C) 97.7 F (36.5 C) 97.7 F (36.5 C) 98.2 F (36.8 C)  TempSrc: Oral Oral Oral Oral  SpO2: 98% 97% 100% 100%  Weight:      Height:        Intake/Output Summary (Last 24 hours) at 11/04/2017 0959 Last data filed at 11/04/2017 0500 Gross per 24 hour  Intake 450 ml  Output 750 ml  Net -300 ml   Filed Weights   11/03/17 1237  Weight: 90.1 kg    Telemetry    SB - Personally Reviewed  Physical Exam   General: Well developed, well nourished, older AA female appearing in no acute distress. Head: Normocephalic, atraumatic.  Neck: Supple without bruits, JVD. Lungs:  Resp regular and unlabored, CTA. Heart: RRR, S1, S2, no S3, S4, or murmur; no rub. Abdomen: Soft, non-tender, non-distended with normoactive bowel sounds.  Extremities: No clubbing, cyanosis, edema. Distal pedal pulses are 2+ bilaterally. Neuro: Alert and oriented X 3. Moves all extremities spontaneously. Psych: Normal affect.  Labs    Chemistry Recent Labs  Lab 11/02/17 2329 11/04/17 0410  NA 145 142  K 3.3* 4.1  CL  106 108  CO2  --  26  GLUCOSE 139* 86  BUN 12 6*  CREATININE 0.60 0.66  CALCIUM  --  8.7*  GFRNONAA  --  >60  GFRAA  --  >60  ANIONGAP  --  8     Hematology Recent Labs  Lab 11/02/17 2304 11/02/17 2329  WBC 6.8  --   RBC 4.79  --   HGB 13.2 14.6  HCT 42.6 43.0  MCV 88.9  --   MCH 27.6  --   MCHC 31.0  --   RDW 14.1  --   PLT 185  --     Cardiac Enzymes Recent Labs  Lab 11/03/17 0452 11/03/17 1249 11/03/17 1918  TROPONINI 0.03* <0.03 <0.03    Recent Labs  Lab 11/02/17 2327  TROPIPOC 0.00     BNPNo results for input(s): BNP, PROBNP in the last 168 hours.   DDimer No results for input(s): DDIMER in the last 168 hours.    Radiology    Ct Chest Wo Contrast  Result Date: 11/03/2017 CLINICAL DATA:  Substernal chest pain elevated heart rate EXAM: CT CHEST WITHOUT CONTRAST TECHNIQUE: Multidetector CT imaging of the chest was performed following the standard protocol without IV contrast. COMPARISON:  Chest x-ray 11/12/2016 CT chest 10/04/2017 FINDINGS: Cardiovascular: Limited without intravenous contrast. Nonaneurysmal aorta. Moderate aortic atherosclerosis. Normal heart  size. No pericardial effusion Mediastinum/Nodes: Midline trachea. No thyroid mass. No significantly enlarged lymph nodes. Esophagus within normal limits Lungs/Pleura: Mild bronchiectasis in the medial upper lobes. Mild subpleural fibrosis. Mild bronchiectasis and fibrosis in the left lung base. No acute opacity or pleural effusion Upper Abdomen: No acute abnormality. Musculoskeletal: No chest wall mass or suspicious bone lesions identified. IMPRESSION: 1. Mild pulmonary fibrosis, without significant change. No acute pulmonary infiltrate. Negative for pneumothorax or acute abnormality allowing for absence of contrast. Aortic Atherosclerosis (ICD10-I70.0). Electronically Signed   By: Donavan Foil M.D.   On: 11/03/2017 01:31   Dg Chest Portable 1 View  Result Date: 11/02/2017 CLINICAL DATA:  Atrial  fibrillation and dyspnea. EXAM: PORTABLE CHEST 1 VIEW COMPARISON:  09/14/2017 CXR and chest CT FINDINGS: Low lung volumes. Mild cardiac enlargement with aortic atherosclerosis. Bibasilar atelectasis is noted. Slightly more confluent opacities at the left lung base cannot exclude a superimposed focus of pneumonia. No effusion or pneumothorax. Osteoarthritis of the [facility] and glenohumeral joints bilaterally. IMPRESSION: 1. Low lung volumes with borderline cardiomegaly and aortic atherosclerosis. 2. Bibasilar atelectasis is noted with slightly more confluent airspace opacities at the left lung base that cannot exclude a small focus of pneumonia. Electronically Signed   By: [name] Royalty M.D.   On: 11/02/2017 23:29   Dg Shoulder Left  Result Date: 11/03/2017 CLINICAL DATA:  Left shoulder pain EXAM: LEFT SHOULDER - 2+ VIEW COMPARISON:  09/05/2006 FINDINGS: Degenerative changes in the [facility] joint with joint space narrowing and spurring. Glenohumeral joint is maintained. No acute bony abnormality. Specifically, no fracture, subluxation, or dislocation. Soft tissues are intact. IMPRESSION: Degenerative changes in the left AC joint. No acute bony abnormality. Electronically Signed   By: Rolm Baptise M.D.   On: 11/03/2017 08:20    Cardiac Studies   N/a   Patient Profile     72 y.o. female with a hx of HTN, HLD, GERD, OSA on CPAP, DM-2, bipolar disease and a fib who presented with Afib.   Assessment & Plan    1. Recurrent afib with RVR:  Has remained in SR since yesterday. Will continue Xarelto and metoprolol. Feels well today.  -- will arrange for follow up in the Afib clinic.   2.    Chest pain, neg MI, most likely due to atrial fib:  Likely 2/2 to her episode of Afib, no further symptoms since converting back to SR. Troponin neg x3. Arrange for outpatient follow up for further testing.   3. Hx of idiopathic PE 04/2016   4. OSA on cpap at night.   5. Sick sinus syndrome:  her rhythm has been stable since  converting to SR.   Signed, Miranda Bellis, NP  11/04/2017, 9:59 AM  Pager # [phone]   For questions or updates, please contact [place] Please consult [email or web address] for contact info under Cardiology/STEMI.   Patient seen and examined and history reviewed. Agree with above findings and plan. Doing well and back in NSR. Therapy of Afib limited by sinus brady. She is on Xarelto. OK for DC today. Will arrange follow up in Afib clinic. Will also arrange outpatient stress myoview to evaluate her chest discomfort with Afib.   Blakelyn Dinges [name], [place] 11/04/2017 11:17 AM

## 2017-11-04 NOTE — Discharge Instructions (Signed)
Diabetes Mellitus and Nutrition When you have diabetes (diabetes mellitus), it is very important to have healthy eating habits because your blood sugar (glucose) levels are greatly affected by what you eat and drink. Eating healthy foods in the appropriate amounts, at about the same times every day, can help you:  Control your blood glucose.  Lower your risk of heart disease.  Improve your blood pressure.  Reach or maintain a healthy weight.  Every person with diabetes is different, and each person has different needs for a meal plan. Your health care provider may recommend that you work with a diet and nutrition specialist (dietitian) to make a meal plan that is best for you. Your meal plan may vary depending on factors such as:  The calories you need.  The medicines you take.  Your weight.  Your blood glucose, blood pressure, and cholesterol levels.  Your activity level.  Other health conditions you have, such as heart or kidney disease.  How do carbohydrates affect me? Carbohydrates affect your blood glucose level more than any other type of food. Eating carbohydrates naturally increases the amount of glucose in your blood. Carbohydrate counting is a method for keeping track of how many carbohydrates you eat. Counting carbohydrates is important to keep your blood glucose at a healthy level, especially if you use insulin or take certain oral diabetes medicines. It is important to know how many carbohydrates you can safely have in each meal. This is different for every person. Your dietitian can help you calculate how many carbohydrates you should have at each meal and for snack. Foods that contain carbohydrates include:  Bread, cereal, rice, pasta, and crackers.  Potatoes and corn.  Peas, beans, and lentils.  Milk and yogurt.  Fruit and juice.  Desserts, such as cakes, cookies, ice cream, and candy.  How does alcohol affect me? Alcohol can cause a sudden decrease in blood  glucose (hypoglycemia), especially if you use insulin or take certain oral diabetes medicines. Hypoglycemia can be a life-threatening condition. Symptoms of hypoglycemia (sleepiness, dizziness, and confusion) are similar to symptoms of having too much alcohol. If your health care provider says that alcohol is safe for you, follow these guidelines:  Limit alcohol intake to no more than 1 drink per day for nonpregnant women and 2 drinks per day for men. One drink equals 12 oz of beer, 5 oz of wine, or 1 oz of hard liquor.  Do not drink on an empty stomach.  Keep yourself hydrated with water, diet soda, or unsweetened iced tea.  Keep in mind that regular soda, juice, and other mixers may contain a lot of sugar and must be counted as carbohydrates.  What are tips for following this plan? Reading food labels  Start by checking the serving size on the label. The amount of calories, carbohydrates, fats, and other nutrients listed on the label are based on one serving of the food. Many foods contain more than one serving per package.  Check the total grams (g) of carbohydrates in one serving. You can calculate the number of servings of carbohydrates in one serving by dividing the total carbohydrates by 15. For example, if a food has 30 g of total carbohydrates, it would be equal to 2 servings of carbohydrates.  Check the number of grams (g) of saturated and trans fats in one serving. Choose foods that have low or no amount of these fats.  Check the number of milligrams (mg) of sodium in one serving. Most people  should limit total sodium intake to less than 2,300 mg per day.  Always check the nutrition information of foods labeled as "low-fat" or "nonfat". These foods may be higher in added sugar or refined carbohydrates and should be avoided.  Talk to your dietitian to identify your daily goals for nutrients listed on the label. Shopping  Avoid buying canned, premade, or processed foods. These  foods tend to be high in fat, sodium, and added sugar.  Shop around the outside edge of the grocery store. This includes fresh fruits and vegetables, bulk grains, fresh meats, and fresh dairy. Cooking  Use low-heat cooking methods, such as baking, instead of high-heat cooking methods like deep frying.  Cook using healthy oils, such as olive, canola, or sunflower oil.  Avoid cooking with butter, cream, or high-fat meats. Meal planning  Eat meals and snacks regularly, preferably at the same times every day. Avoid going long periods of time without eating.  Eat foods high in fiber, such as fresh fruits, vegetables, beans, and whole grains. Talk to your dietitian about how many servings of carbohydrates you can eat at each meal.  Eat 4-6 ounces of lean protein each day, such as lean meat, chicken, fish, eggs, or tofu. 1 ounce is equal to 1 ounce of meat, chicken, or fish, 1 egg, or 1/4 cup of tofu.  Eat some foods each day that contain healthy fats, such as avocado, nuts, seeds, and fish. Lifestyle   Check your blood glucose regularly.  Exercise at least 30 minutes 5 or more days each week, or as told by your health care provider.  Take medicines as told by your health care provider.  Do not use any products that contain nicotine or tobacco, such as cigarettes and e-cigarettes. If you need help quitting, ask your health care provider.  Work with a Social worker or diabetes educator to identify strategies to manage stress and any emotional and social challenges. What are some questions to ask my health care provider?  Do I need to meet with a diabetes educator?  Do I need to meet with a dietitian?  What number can I call if I have questions?  When are the best times to check my blood glucose? Where to find more information:  American Diabetes Association: diabetes.org/food-and-fitness/food  Academy of Nutrition and Dietetics:  PokerClues.dk  [employer] of Diabetes and Digestive and Kidney Diseases (NIH): ContactWire.be Summary  A healthy meal plan will help you control your blood glucose and maintain a healthy lifestyle.  Working with a diet and nutrition specialist (dietitian) can help you make a meal plan that is best for you.  Keep in mind that carbohydrates and alcohol have immediate effects on your blood glucose levels. It is important to count carbohydrates and to use alcohol carefully. This information is not intended to replace advice given to you by your health care provider. Make sure you discuss any questions you have with your health care provider. Document Released: 11/27/2004 Document Revised: 04/06/2016 Document Reviewed: 04/06/2016 Elsevier Interactive Patient Education  2018 [place] Heart-healthy meal planning includes:  Limiting unhealthy fats.  Increasing healthy fats.  Making other small dietary changes.  You may need to talk with your doctor or a diet specialist (dietitian) to create an eating plan that is right for you. What types of fat should I choose?  Choose healthy fats. These include olive oil and canola oil, flaxseeds, walnuts, almonds, and seeds.  Eat more omega-3 fats. These include salmon, mackerel,  sardines, tuna, flaxseed oil, and ground flaxseeds. Try to eat fish at least twice each week.  Limit saturated fats. ? Saturated fats are often found in animal products, such as meats, butter, and cream. ? Plant sources of saturated fats include palm oil, palm kernel oil, and coconut oil.  Avoid foods with partially hydrogenated oils in them. These include stick margarine, some tub margarines, cookies, crackers, and other baked goods. These contain trans fats. What general guidelines do I need to follow?  Check food  labels carefully. Identify foods with trans fats or high amounts of saturated fat.  Fill one half of your plate with vegetables and green salads. Eat 4-5 servings of vegetables per day. A serving of vegetables is: ? 1 cup of raw leafy vegetables. ?  cup of raw or cooked cut-up vegetables. ?  cup of vegetable juice.  Fill one fourth of your plate with whole grains. Look for the word "whole" as the first word in the ingredient list.  Fill one fourth of your plate with lean protein foods.  Eat 4-5 servings of fruit per day. A serving of fruit is: ? One medium whole fruit. ?  cup of dried fruit. ?  cup of fresh, frozen, or canned fruit. ?  cup of 100% fruit juice.  Eat more foods that contain soluble fiber. These include apples, broccoli, carrots, beans, peas, and barley. Try to get 20-30 g of fiber per day.  Eat more home-cooked food. Eat less restaurant, buffet, and fast food.  Limit or avoid alcohol.  Limit foods high in starch and sugar.  Avoid fried foods.  Avoid frying your food. Try baking, boiling, grilling, or broiling it instead. You can also reduce fat by: ? Removing the skin from poultry. ? Removing all visible fats from meats. ? Skimming the fat off of stews, soups, and gravies before serving them. ? Steaming vegetables in water or broth.  Lose weight if you are overweight.  Eat 4-5 servings of nuts, legumes, and seeds per week: ? One serving of dried beans or legumes equals  cup after being cooked. ? One serving of nuts equals 1 ounces. ? One serving of seeds equals  ounce or one tablespoon.  You may need to keep track of how much salt or sodium you eat. This is especially true if you have high blood pressure. Talk with your doctor or dietitian to get more information. What foods can I eat? Grains Breads, including [place], white, pita, wheat, raisin, rye, oatmeal, and [place]. Tortillas that are neither fried nor made with lard or trans fat. Low-fat rolls,  including hotdog and hamburger buns and English muffins. Biscuits. Muffins. Waffles. Pancakes. Light popcorn. Whole-grain cereals. Flatbread. Melba toast. Pretzels. Breadsticks. Rusks. Low-fat snacks. Low-fat crackers, including oyster, saltine, matzo, graham, animal, and rye. Rice and pasta, including brown rice and pastas that are made with whole wheat. Vegetables All vegetables. Fruits All fruits, but limit coconut. Meats and Other Protein Sources Lean, well-trimmed beef, veal, pork, and lamb. Chicken and [place] without skin. All fish and shellfish. Wild duck, rabbit, pheasant, and venison. Egg whites or low-cholesterol egg substitutes. Dried beans, peas, lentils, and tofu. Seeds and most nuts. Dairy Low-fat or nonfat cheeses, including ricotta, string, and mozzarella. Skim or 1% milk that is liquid, powdered, or evaporated. Buttermilk that is made with low-fat milk. Nonfat or low-fat yogurt. Beverages Mineral water. Diet carbonated beverages. Sweets and Desserts Sherbets and fruit ices. Honey, jam, marmalade, jelly, and syrups. Meringues and gelatins. Pure  sugar candy, such as hard candy, jelly beans, gumdrops, mints, marshmallows, and small amounts of dark chocolate. [employer] Eat all sweets and desserts in moderation. Fats and Oils Nonhydrogenated (trans-free) margarines. Vegetable oils, including soybean, sesame, sunflower, olive, peanut, safflower, corn, canola, and cottonseed. Salad dressings or mayonnaise made with a vegetable oil. Limit added fats and oils that you use for cooking, baking, salads, and as spreads. Other Cocoa powder. Coffee and tea. All seasonings and condiments. The items listed above may not be a complete list of recommended foods or beverages. Contact your dietitian for more options. What foods are not recommended? Grains Breads that are made with saturated or trans fats, oils, or whole milk. Croissants. Butter rolls. Cheese breads. Sweet rolls. Donuts.  Buttered popcorn. Chow mein noodles. High-fat crackers, such as cheese or butter crackers. Meats and Other Protein Sources Fatty meats, such as hotdogs, short ribs, sausage, spareribs, bacon, rib eye roast or steak, and mutton. High-fat deli meats, such as salami and bologna. Caviar. Domestic duck and goose. Organ meats, such as kidney, liver, sweetbreads, and heart. Dairy Cream, sour cream, cream cheese, and creamed cottage cheese. Whole-milk cheeses, including blue (bleu), Monte[place] Jack, [place], [place], American, [place], Swiss, cheddar, [place], and [place]. Whole or 2% milk that is liquid, evaporated, or condensed. Whole buttermilk. Cream sauce or high-fat cheese sauce. Yogurt that is made from whole milk. Beverages Regular sodas and juice drinks with added sugar. Sweets and Desserts Frosting. Pudding. Cookies. Cakes other than angel food cake. Candy that has milk chocolate or white chocolate, hydrogenated fat, butter, coconut, or unknown ingredients. Buttered syrups. Full-fat ice cream or ice cream drinks. Fats and Oils Gravy that has suet, meat fat, or shortening. Cocoa butter, hydrogenated oils, palm oil, coconut oil, palm kernel oil. These can often be found in baked products, candy, fried foods, nondairy creamers, and whipped toppings. Solid fats and shortenings, including bacon fat, salt pork, lard, and butter. Nondairy cream substitutes, such as coffee creamers and sour cream substitutes. Salad dressings that are made of unknown oils, cheese, or sour cream. The items listed above may not be a complete list of foods and beverages to avoid. Contact your dietitian for more information. This information is not intended to replace advice given to you by your health care provider. Make sure you discuss any questions you have with your health care provider. Document Released: 09/01/2011 Document Revised: 08/08/2015 Document Reviewed: 08/24/2013 Elsevier Interactive Patient Education  [employer].

## 2017-11-04 NOTE — Discharge Summary (Signed)
Physician Discharge Summary  Miranda [name]:[id number] DOB: 11/16/1945 DOA: 11/02/2017  PCP: Marletta Lor, MD  Admit date: 11/02/2017 Discharge date: 11/04/2017  Admitted From: home Disposition:  home  Recommendations for Outpatient Follow-up:  1. Follow up with PCP in 1-2 weeks 2. Cardiology will arrange follow-up for ischemic evaluation as well as EP  Home Health: None Equipment/Devices: None  Discharge Condition: Stable CODE STATUS: Full code Diet recommendation: Heart healthy  HPI: Per Dr. Alcario Drought, Miranda [name] is a 72 y.o. female with medical history significant of PAF new diagnosis in July, DM2, HTN, PE, on Xarelto chronically. Patient was getting ready for bed, when she had sudden onset substernal chest pain with radiation to L arm.  Ems called.  Initial HR 180.  Total of 18 adenosine given with no change other than HR down to 130 on arrival.  Hospital Course: Paroxysmal A. Fib -Initially placed on Cardizem infusion, converted to sinus rhythm and has remained in sinus.  Historically based on outpatient notes bradycardia presents up titration of her AV nodal agents.  She did have a 5-second pause on telemetry review while in A. fib.  I have consulted cardiology, appreciate input, recommended ongoing outpatient follow-up.  She remains in sinus rhythm, cardiology cleared patient for discharge with close follow-up in office. She is anticoagulated with Xarelto, continue metoprolol Left shoulder stiffness/pain -Patient complains of inability to keep her left arm raised.  This is been going on for 2 weeks, per her has received a steroid shot.  She is tender to palpation and tender with range of motion movements.  Plain x-ray without acute findings but arthritic changes.  Recommend outpatient PT and ongoing outpatient evaluation by PCP Hypertension -Continue Lasix, Cozaar, metoprolol Hyperlipidemia -Continue statin Type 2 diabetes mellitus -Continue  metformin  Discharge Diagnoses:  Principal Problem:   Atrial fibrillation with RVR ([place]) Active Problems:   HTN (hypertension)   Diabetes mellitus without complication [facility])     Discharge Instructions  Discharge Instructions    Ambulatory referral to Physical Therapy   Complete by:  As directed    Outpatient PT for Left Shoulder RA   Iontophoresis - 4 mg/ml of dexamethasone:  No   T.E.N.S. Unit Evaluation and Dispense as Indicated:  No     Allergies as of 11/04/2017      Reactions   Fish Oil Anaphylaxis   Other Hives, Shortness Of Breath, Swelling   Allergic to cashew nuts and peanut oil.   Penicillins Anaphylaxis, Hives, Swelling   Has patient had a PCN reaction causing immediate rash, facial/tongue/throat swelling, SOB or lightheadedness with hypotension: Face swelling and hives started first, then swelling of the throat  Has patient had a PCN reaction causing severe rash involving mucus membranes or skin necrosis: Yes  Has patient had a PCN reaction that required hospitalization: No  Has patient had a PCN reaction occurring within the last 10 years: Yes  If all of the above answers are "NO", then may proceed with Cephalospor   Pneumococcal Vaccines Nausea And Vomiting   Nitrofurantoin Macrocrystal Hives   Aspirin Itching, Rash   Bactrim [sulfamethoxazole-trimethoprim] Hives, Itching, Rash   Ciprofloxacin Hives, Itching, Rash   Ibuprofen Rash   Influenza Vaccines Hives   Ivp Dye [iodinated Diagnostic Agents] Hives, Itching, Rash   Gives benadryl to counteract symptoms   Latex Rash   Macrobid [nitrofurantoin Monohydrate Macrocrystals] Hives   Shellfish Allergy Hives   Patient also allergic to seafood      Medication  List    TAKE these medications   ALPRAZolam 0.25 MG tablet Commonly known as:  XANAX Take 1 tablet (0.25 mg total) by mouth 3 (three) times daily as needed for anxiety or sleep.   amLODipine 10 MG tablet Commonly known as:  NORVASC Take 1 tablet  (10 mg total) by mouth daily.   atorvastatin 20 MG tablet Commonly known as:  LIPITOR Take 1 tablet (20 mg total) by mouth daily.   cyclobenzaprine 10 MG tablet Commonly known as:  FLEXERIL Take 1 tablet (10 mg total) by mouth 3 (three) times daily as needed. What changed:  reasons to take this   diclofenac sodium 1 % Gel Commonly known as:  VOLTAREN Apply 2 g topically 4 (four) times daily.   fluticasone 50 MCG/ACT nasal spray Commonly known as:  FLONASE Place 1 spray into both nostrils daily. What changed:    when to take this  reasons to take this   furosemide 40 MG tablet Commonly known as:  LASIX Take 0.5 tablets (20 mg total) by mouth daily.   HYDROcodone-acetaminophen 10-325 MG tablet Commonly known as:  NORCO Take 1 tablet by mouth every 6 (six) hours as needed for moderate pain. Refill 1 month   lactulose 10 GM/15ML solution Commonly known as:  CHRONULAC TAKE 30 MILLILITERS THREE TIMES DAILY BY  MOUTH AS NEEDED FOR MILD CONSTIPATION   losartan 100 MG tablet Commonly known as:  COZAAR Take 1 tablet (100 mg total) by mouth daily.   metFORMIN 500 MG 24 hr tablet Commonly known as:  GLUCOPHAGE-XR Take 2 tablets (1,000 mg total) by mouth daily with breakfast.   metoprolol tartrate 25 MG tablet Commonly known as:  LOPRESSOR Take 1 tablet (25 mg total) by mouth 2 (two) times daily.   Pirfenidone 801 MG Tabs Take 1 tablet by mouth 3 (three) times daily.   rivaroxaban 20 MG Tabs tablet Commonly known as:  XARELTO Take 1 tablet (20 mg total) by mouth daily with supper.      Follow-up Information    [name], MD Follow up on 11/23/2017.   Specialty:  Cardiology Why:  at 2:00PM  to make further decisions on atrial fib. Contact information: [address] [place] [address] [phone]        Marletta Lor, MD. Schedule an appointment as soon as possible for a visit in 1 week(s).   Specialty:  Internal Medicine Contact  information: [place] [address] [phone]        [name], [name], MD .   Specialty:  Cardiology Contact information: [address] [place] [address] [place]. Go to.   Why:  [address],  [place]  [address]  Rehab will call to arrange appointment. Call [phone] if no contact has been made within 2-3 days.           Consultations:  Cardiology   Procedures/Studies:  Ct Chest Wo Contrast  Result Date: 11/03/2017 CLINICAL DATA:  Substernal chest pain elevated heart rate EXAM: CT CHEST WITHOUT CONTRAST TECHNIQUE: Multidetector CT imaging of the chest was performed following the standard protocol without IV contrast. COMPARISON:  Chest x-ray 11/12/2016 CT chest 10/04/2017 FINDINGS: Cardiovascular: Limited without intravenous contrast. Nonaneurysmal aorta. Moderate aortic atherosclerosis. Normal heart size. No pericardial effusion Mediastinum/Nodes: Midline trachea. No thyroid mass. No significantly enlarged lymph nodes. Esophagus within normal limits Lungs/Pleura: Mild bronchiectasis in  the medial upper lobes. Mild subpleural fibrosis. Mild bronchiectasis and fibrosis in the left lung base. No acute opacity or pleural effusion Upper Abdomen: No acute abnormality. Musculoskeletal: No chest wall mass or suspicious bone lesions identified. IMPRESSION: 1. Mild pulmonary fibrosis, without significant change. No acute pulmonary infiltrate. Negative for pneumothorax or acute abnormality allowing for absence of contrast. Aortic Atherosclerosis (ICD10-I70.0). Electronically Signed   By: Donavan Foil M.D.   On: 11/03/2017 01:31   Dg Chest Portable 1 View  Result Date: 11/02/2017 CLINICAL DATA:  Atrial fibrillation and dyspnea. EXAM: PORTABLE CHEST 1 VIEW COMPARISON:  09/14/2017 CXR and chest CT FINDINGS: Low lung volumes. Mild cardiac enlargement with aortic atherosclerosis. Bibasilar  atelectasis is noted. Slightly more confluent opacities at the left lung base cannot exclude a superimposed focus of pneumonia. No effusion or pneumothorax. Osteoarthritis of the [facility] and glenohumeral joints bilaterally. IMPRESSION: 1. Low lung volumes with borderline cardiomegaly and aortic atherosclerosis. 2. Bibasilar atelectasis is noted with slightly more confluent airspace opacities at the left lung base that cannot exclude a small focus of pneumonia. Electronically Signed   By: [name] Royalty M.D.   On: 11/02/2017 23:29   Dg Shoulder Left  Result Date: 11/03/2017 CLINICAL DATA:  Left shoulder pain EXAM: LEFT SHOULDER - 2+ VIEW COMPARISON:  09/05/2006 FINDINGS: Degenerative changes in the [facility] joint with joint space narrowing and spurring. Glenohumeral joint is maintained. No acute bony abnormality. Specifically, no fracture, subluxation, or dislocation. Soft tissues are intact. IMPRESSION: Degenerative changes in the left AC joint. No acute bony abnormality. Electronically Signed   By: Rolm Baptise M.D.   On: 11/03/2017 08:20      Subjective: - no chest pain, shortness of breath, no abdominal pain, nausea or vomiting.   Discharge Exam: Vitals:   11/04/17 0527 11/04/17 0814  BP: (!) 112/93 (!) 168/76  Pulse: 63 (!) 57  Resp: 13   Temp: 97.7 F (36.5 C) 98.2 F (36.8 C)  SpO2: 100% 100%    General: Pt is alert, awake, not in acute distress Cardiovascular: RRR, S1/S2 +, no rubs, no gallops Respiratory: CTA bilaterally, no wheezing, no rhonchi Abdominal: Soft, NT, ND, bowel sounds + Extremities: no edema, no cyanosis    The results of significant diagnostics from this hospitalization (including imaging, microbiology, ancillary and laboratory) are listed below for reference.     Microbiology: Recent Results (from the past 240 hour(s))  MRSA PCR Screening     Status: None   Collection Time: 11/03/17 12:47 PM  Result Value Ref Range Status   MRSA by PCR NEGATIVE NEGATIVE Final     Comment:        The GeneXpert MRSA Assay (FDA approved for NASAL specimens only), is one component of a comprehensive MRSA colonization surveillance program. It is not intended to diagnose MRSA infection nor to guide or monitor treatment for MRSA infections. Performed at [facility], [place] [address] [place], [place] [address]      Labs: BNP (last 3 results) Recent Labs    09/14/17 1556  BNP 15.4   Basic Metabolic Panel: Recent Labs  Lab 11/02/17 2329 11/04/17 0410  NA 145 142  K 3.3* 4.1  CL 106 108  CO2  --  26  GLUCOSE 139* 86  BUN 12 6*  CREATININE 0.60 0.66  CALCIUM  --  8.7*   Liver Function Tests: No results for input(s): AST, ALT, ALKPHOS, BILITOT, PROT, ALBUMIN in the last 168 hours. No results for input(s): LIPASE, AMYLASE  in the last 168 hours. No results for input(s): AMMONIA in the last 168 hours. CBC: Recent Labs  Lab 11/02/17 2304 11/02/17 2329  WBC 6.8  --   NEUTROABS 3.3  --   HGB 13.2 14.6  HCT 42.6 43.0  MCV 88.9  --   PLT 185  --    Cardiac Enzymes: Recent Labs  Lab 11/03/17 0452 11/03/17 1249 11/03/17 1918  TROPONINI 0.03* <0.03 <0.03   BNP: Invalid input(s): POCBNP CBG: Recent Labs  Lab 11/03/17 1658  GLUCAP 141*   D-Dimer No results for input(s): DDIMER in the last 72 hours. Hgb A1c No results for input(s): HGBA1C in the last 72 hours. Lipid Profile No results for input(s): CHOL, HDL, LDLCALC, TRIG, CHOLHDL, LDLDIRECT in the last 72 hours. Thyroid function studies No results for input(s): TSH, T4TOTAL, T3FREE, THYROIDAB in the last 72 hours.  Invalid input(s): FREET3 Anemia work up No results for input(s): VITAMINB12, FOLATE, FERRITIN, TIBC, IRON, RETICCTPCT in the last 72 hours. Urinalysis    Component Value Date/Time   COLORURINE YELLOW 04/16/2015 1558   APPEARANCEUR CLEAR 04/16/2015 1558   LABSPEC 1.011 04/16/2015 1558   PHURINE 6.0 04/16/2015 1558   GLUCOSEU NEGATIVE 04/16/2015 1558   HGBUR TRACE  (A) 04/16/2015 1558   HGBUR trace-lysed 03/26/2010 0831   BILIRUBINUR NEGATIVE 04/16/2015 1558   BILIRUBINUR negative 07/07/2013 1125   KETONESUR NEGATIVE 04/16/2015 1558   PROTEINUR NEGATIVE 04/16/2015 1558   UROBILINOGEN 0.2 07/07/2013 1125   UROBILINOGEN 0.2 09/14/2011 1133   NITRITE NEGATIVE 04/16/2015 1558   LEUKOCYTESUR SMALL (A) 04/16/2015 1558   Sepsis Labs Invalid input(s): PROCALCITONIN,  WBC,  LACTICIDVEN   Time coordinating discharge: 35 minutes  SIGNED:  Marzetta Board, MD  [facility] 11/04/2017, 2:49 PM Pager [phone]  If 7PM-7AM, please contact night-coverage [email or web address] Password [email or web address]

## 2017-11-05 ENCOUNTER — Telehealth: Payer: Self-pay | Admitting: *Deleted

## 2017-11-05 ENCOUNTER — Telehealth (HOSPITAL_COMMUNITY): Payer: Self-pay | Admitting: *Deleted

## 2017-11-05 NOTE — Telephone Encounter (Signed)
Unable to reach patient at time of TCM Call. Left message for patient to return call when available.  

## 2017-11-05 NOTE — Telephone Encounter (Signed)
I cld pt to offer appt with afib clinic and to find out how she is feeling since discharge.  Pt reports she is feeling well other than her shoulder.  She was given appt info regarding upcoming appt with Dr. Curt Bears.  At this time she wants to keep that appt and just call us if needs to be seen sooner.

## 2017-11-09 ENCOUNTER — Telehealth (HOSPITAL_COMMUNITY): Payer: Self-pay | Admitting: *Deleted

## 2017-11-09 NOTE — Telephone Encounter (Signed)
Left message on voicemail per DPR in reference to upcoming appointment scheduled on 11/12/17 with detailed instructions given per Myocardial Perfusion Study Information Sheet for the test. LM to arrive 15 minutes early, and that it is imperative to arrive on time for appointment to keep from having the test rescheduled. If you need to cancel or reschedule your appointment, please call the office within 24 hours of your appointment. Failure to do so may result in a cancellation of your appointment, and a $50 no show fee. Phone number given for call back for any questions. Kirstie Peri

## 2017-11-11 ENCOUNTER — Ambulatory Visit: Payer: Medicare Other | Attending: Internal Medicine | Admitting: Physical Therapy

## 2017-11-11 ENCOUNTER — Encounter: Payer: Self-pay | Admitting: Physical Therapy

## 2017-11-11 ENCOUNTER — Other Ambulatory Visit: Payer: Self-pay

## 2017-11-11 DIAGNOSIS — R600 Localized edema: Secondary | ICD-10-CM | POA: Diagnosis not present

## 2017-11-11 DIAGNOSIS — G8929 Other chronic pain: Secondary | ICD-10-CM

## 2017-11-11 DIAGNOSIS — M25612 Stiffness of left shoulder, not elsewhere classified: Secondary | ICD-10-CM | POA: Diagnosis not present

## 2017-11-11 DIAGNOSIS — M25512 Pain in left shoulder: Secondary | ICD-10-CM | POA: Insufficient documentation

## 2017-11-11 DIAGNOSIS — M6281 Muscle weakness (generalized): Secondary | ICD-10-CM | POA: Diagnosis not present

## 2017-11-11 NOTE — Therapy (Addendum)
[place], [place], [address] Phone: [phone]   Fax:  [phone]  Physical Therapy Evaluation  Patient Details  Name: Miranda [name] MRN: [id number] Date of Birth: Sep 08, 1945 Referring Provider: Marzetta Board MD   Encounter Date: 11/11/2017  PT End of Session - 11/11/17 1702    Visit Number  1    Number of Visits  13    Date for PT Re-Evaluation  12/23/17    Authorization Type  MCR (Kx mod by 15th visit and progress note by 10th)    PT Start Time  1600    PT Stop Time  1648    PT Time Calculation (min)  48 min    Activity Tolerance  Patient tolerated treatment well;Patient limited by pain    Behavior During Therapy  [facility] for tasks assessed/performed       Past Medical History:  Diagnosis Date  . Anxiety   . Arthritis   . Asthma   . Atrial fibrillation ([place])   . CHF (congestive heart failure) ([place])    pt. unsure- but thinks she was hosp. for CHF- 2002  . [facility]  . Clotting disorder ([place])    blood clots in lungs/PE pulmonary embolism  . Colon polyps   . Complication of anesthesia    states requires a lot med. to put her to sleep   . DDD (degenerative disc disease) 09/17/2011  . Depression    "sometimes "  . DM (diabetes mellitus) ([place])   . Family history of anesthesia complication   . GERD (gastroesophageal reflux disease)   . Glaucoma    bilateral, pt. admits that she is noncompliant to eye gtts.   . Hemorrhoids   . Hyperlipidemia   . Hypertension    had stress, echo- 2006 /w [place], Cardiac Cath, per pt. 2002, echo repeated 2012- wnl   . Low back pain   . Shortness of breath   . Sleep apnea    uses c-pap- q night recently    Past Surgical History:  Procedure Laterality Date  . ABDOMINAL HYSTERECTOMY     ectopic, fibroids  . arm surgery Right   . CARDIAC CATHETERIZATION    . CATARACT EXTRACTION, BILATERAL     cataracts removed  bilateral- ?IOL  . COLONOSCOPY     remote  . FISSURECTOMY  10/08/2011   Procedure: FISSURECTOMY;  Surgeon: Stark Klein, MD;  Location: [place];  Service: General;  Laterality: N/A;  . FLEXIBLE SIGMOIDOSCOPY  02/25/2011   Procedure: FLEXIBLE SIGMOIDOSCOPY;  Surgeon: Inda Castle, MD;  Location: [facility];  Service: Endoscopy;  Laterality: N/A;  . FOOT SURGERY     bilat, heel spurs- screw in R foot   . HEMORRHOID SURGERY  10/08/2011   Procedure: HEMORRHOIDECTOMY;  Surgeon: Stark Klein, MD;  Location: [place];  Service: General;  Laterality: N/A;  External   . SPHINCTEROTOMY  10/08/2011   Procedure: Joan Mayans;  Surgeon: Stark Klein, MD;  Location: [place];  Service: General;  Laterality: N/A;    There were no vitals filed for this visit.   Subjective Assessment - 11/11/17 1611    Subjective  pt is a 72 y.o F with with CC of L shoulder pain that has been going on for over a couple of weeks or a month or so, with no specific onset.  She reports she was admitted to the hospital 2 week for A-fib and had difficulty raising  the L arm and was given her referral to PT. Since onset she reports the pain has gotten worse. pain stays mostly in the shoulder with occasional referral down the mid/ upper arm.     Pertinent History  hx of Afib and pulmonary embolism    Limitations  Lifting    How long can you sit comfortably?  unlimited    How long can you stand comfortably?  30 min    How long can you walk comfortably?  15 min     Diagnostic tests  x-ray 11/03/2017    Patient Stated Goals  to calm down the pain, to return to bowling, return to house work and general activities    Currently in Pain?  Yes    Pain Score  6    at worst 8/10.    Pain Location  Shoulder    Pain Orientation  Left;Lateral;Anterior    Pain Descriptors / Indicators  Nagging    Pain Type  Chronic pain    Pain Radiating Towards  to the mid/upper arm    Pain Onset  More than a month ago    Pain Frequency  Intermittent     Aggravating Factors   lifting/ moving the arm, general house work,     Pain Relieving Factors  ice, medicated cream    Effect of Pain on Daily Activities  limited UE use         [facility] PT Assessment - 11/11/17 1601      Assessment   Medical Diagnosis  L shoulder arthritis    Referring Provider  Marzetta Board MD    Onset Date/Surgical Date  --   1-2 months ago   Hand Dominance  Right    Next MD Visit  --   unsure   Prior Therapy  yes      Precautions   Precaution Comments  avoid picking up heavy weight with LUE      Restrictions   Weight Bearing Restrictions  No      Balance Screen   Has the patient fallen in the past 6 months  No    Has the patient had a decrease in activity level because of a fear of falling?   No    Is the patient reluctant to leave their home because of a fear of falling?   No      Home Environment   Living Environment  Private residence    Living Arrangements  Spouse/significant other;Children    Available Help at Discharge  Family;Available PRN/intermittently    Type of Home  House    Home Access  Stairs to enter    Entrance Stairs-Number of Steps  4    Entrance Stairs-Rails  Can reach both    Home Layout  One level    [place] - single point;Walker - 2 wheels      Prior Function   Level of Independence  Independent with basic ADLs    Vocation  Retired    Leisure  bowling, taking care of grand children      Cognition   Overall Cognitive Status  Within Functional Limits for tasks assessed      Posture/Postural Control   Posture/Postural Control  Postural limitations    Postural Limitations  Rounded Shoulders;Forward head      ROM / Strength   AROM / PROM / Strength  AROM;PROM;Strength      AROM   AROM Assessment Site  Shoulder  Right/Left Shoulder  Right;Left    Right Shoulder Extension  56 Degrees    Right Shoulder Flexion  137 Degrees    Right Shoulder ABduction  118 Degrees    Right Shoulder Internal Rotation  50  Degrees    Right Shoulder External Rotation  90 Degrees    Left Shoulder Extension  24 Degrees    Left Shoulder Flexion  25 Degrees    Left Shoulder ABduction  30 Degrees    Left Shoulder Internal Rotation  45 Degrees   to stomach,m with shoulder in neutral   Left Shoulder External Rotation  50 Degrees   with shoulder in neutral     PROM   PROM Assessment Site  Shoulder    Right/Left Shoulder  Left    Left Shoulder Flexion  70 Degrees    Left Shoulder ABduction  30 Degrees    Left Shoulder Internal Rotation  30 Degrees   at 30 degrees abduction   Left Shoulder External Rotation  30 Degrees   at 30 degrees abduction     Strength   Strength Assessment Site  Shoulder;Hand    Right/Left Shoulder  Right;Left    Right Hand Grip (lbs)  37.6   40,38,35   Left Hand Grip (lbs)  12.6   17,13,8     Palpation   Palpation comment  TTP at the greater tubercle, and the supraspinatus. and spasm in the upper trapezius and mild soreness noted in the teres minor, A/C joint      Special Tests    Special Tests  Rotator Cuff Impingement    Rotator Cuff Impingment tests  Michel Bickers test;Full Can test;Empty Can test;Painful Arc of Motion      Hawkins-Kennedy test   Findings  Unable to test      Empty Can test   Findings  Unable to test      Full Can test   Findings  Unable to test      Painful Arc of Motion   Findings  Unable to test                Objective measurements completed on examination: See above findings.              PT Education - 11/11/17 1701    Education Details  evaluation findings, POC, goals, HEP with proper form/ rationale, anatomy of the area involved    Person(s) Educated  Patient    Methods  Explanation;Verbal cues;Handout;Demonstration   used skeleton for demonstration   Comprehension  Verbalized understanding;Verbal cues required       PT Short Term Goals - 11/11/17 1712      PT SHORT TERM GOAL #1   Title  pt to be I with inital  HEP    Time  3    Period  Weeks    Status  New    Target Date  12/02/17      PT SHORT TERM GOAL #2   Title  pt to verbalize/ demo techniques for proper posture to prevent L shoulder pain     Time  3    Period  Weeks    Status  New    Target Date  12/02/17      PT SHORT TERM GOAL #3   Title  increase L shoulder AAROM flexion/ abduction to >/= 90 degrees with </= 6/10 pain for functional and therapuetic progression    Time  3    Period  Weeks  Status  New    Target Date  12/02/17        PT Long Term Goals - 11/11/17 1714      PT LONG TERM GOAL #1   Title  increase L shoulder flexion/ abduction to >/= 120 and IR/ER to >/= 50 degrees with </= 5/10 pain for functional ROm for ADLs    Time  6    Period  Weeks    Status  New    Target Date  12/23/17      PT LONG TERM GOAL #2   Title  pt to increase L shoulder strength to >/= 4/5 in all planes to promote shoulder stability with household lifting activities     Time  6    Period  Weeks    Status  New    Target Date  12/23/17      PT LONG TERM GOAL #3   Title  pt to be able to lift and carry >/= 8# for functional lifting activitys and for pt's goal of return to bowling with </= 4/10 pain     Time  6    Period  Weeks    Status  New    Target Date  12/23/17      PT LONG TERM GOAL #4   Title  pt to be I with intial HEp given as of last visit    Time  6    Period  Weeks    Status  New    Target Date  12/23/17             Plan - 11/11/17 1703    Clinical Impression Statement  pt presents to OPPT with CC of L shoulder pain with non-specific onset starting 1-2 months ago. she demonstrates limited L shoulder mobility often using her RUE to attempt to assist with LUE assessment, however pt would intermittently extend her L shoulder independently stating the pain comes and goes.  limited strength assessment due to pain and possible rotator cuff involvement but unable to perform special testing due to pain and limited ROM.  TTP specially along the A/C joint, greater tubercle, supraspintaus and upper trap. she would benefit from physical therapy to increase shoulder mobility and reduce pain, improve strength and maximize function by addressing the deficits listed.     History and Personal Factors relevant to plan of care:  hx of recent hospital admistion due to AFIB, depression, anxiety    Clinical Presentation  Unstable    Clinical Presentation due to:  worsening pain since onset, limited shoulder ROM, limited strength, muscle spasm/ guarding    Clinical Decision Making  High    Rehab Potential  Good    PT Frequency  2x / week    PT Duration  6 weeks    PT Treatment/Interventions  ADLs/Self Care Home Management;Cryotherapy;Electrical Stimulation;Iontophoresis 4mg /ml Dexamethasone;Moist Heat;Ultrasound;Taping;Vasopneumatic Device;Manual techniques;Therapeutic exercise;Therapeutic activities;Passive range of motion;Dry needling;Gait training;Patient/family education    PT Next Visit Plan  review/ update HEP, shoulder PROM/ AAROM, Soft tissue work, upper trap stretching, scapular setting, modalities PRN     PT Home Exercise Plan  table slides flexion/ abduction, upper trap stretching, scapular retraction    Consulted and Agree with Plan of Care  Patient       Patient will benefit from skilled therapeutic intervention in order to improve the following deficits and impairments:  Decreased activity tolerance, Decreased endurance, Decreased range of motion, Pain, Postural dysfunction, Impaired flexibility, Increased muscle spasms, Increased fascial restricitons, Decreased  strength, Increased edema  Visit Diagnosis: Chronic left shoulder pain - Plan: PT plan of care cert/re-cert  Stiffness of left shoulder, not elsewhere classified - Plan: PT plan of care cert/re-cert  Muscle weakness (generalized) - Plan: PT plan of care cert/re-cert  Localized edema - Plan: PT plan of care cert/re-cert     Problem List Patient  Active Problem List   Diagnosis Date Noted  . Atrial fibrillation with RVR ([place]) 11/03/2017  . LVH (left ventricular hypertrophy) 09/22/2017  . Diabetes mellitus without complication ([place]) 21/30/8657  . Pulmonary emboli ([place]) 05/02/2016  . Right leg pain 05/01/2016  . ILD (interstitial lung disease) ([place]) 05/01/2016  . Bradycardia 05/01/2016  . Coronary artery calcification seen on CAT scan 08/06/2015  . UIP (usual interstitial pneumonitis) ([place]) 04/05/2015  . Osteoarthritis 09/17/2011  . Pyelonephritis with possible nonobstructing 5 mm calculus by renal ultrasound 09/16/2011  . OSA (obstructive sleep apnea) 06/10/2011  . Anal fissure 02/17/2011  . History of colonic polyps 04/03/2010  . INSOMNIA 09/28/2007  . Asthma 11/22/2006  . Dyslipidemia 08/19/2006  . HTN (hypertension) 08/19/2006  . GERD 08/19/2006  . LOW BACK PAIN 08/19/2006   Starr Lake PT, DPT, LAT, ATC  11/11/17  5:31 PM      [place] [facility] [address] [place], [place], [address] Phone: [phone]   Fax:  [phone]  Name: Miranda [name] MRN: [id number] Date of Birth: May 10, 1945

## 2017-11-12 ENCOUNTER — Ambulatory Visit (HOSPITAL_COMMUNITY): Payer: Medicare Other | Attending: Cardiovascular Disease

## 2017-11-12 ENCOUNTER — Telehealth: Payer: Self-pay | Admitting: Internal Medicine

## 2017-11-12 DIAGNOSIS — R001 Bradycardia, unspecified: Secondary | ICD-10-CM | POA: Insufficient documentation

## 2017-11-12 DIAGNOSIS — I10 Essential (primary) hypertension: Secondary | ICD-10-CM | POA: Insufficient documentation

## 2017-11-12 DIAGNOSIS — E119 Type 2 diabetes mellitus without complications: Secondary | ICD-10-CM | POA: Insufficient documentation

## 2017-11-12 DIAGNOSIS — I4891 Unspecified atrial fibrillation: Secondary | ICD-10-CM | POA: Insufficient documentation

## 2017-11-12 DIAGNOSIS — R079 Chest pain, unspecified: Secondary | ICD-10-CM | POA: Insufficient documentation

## 2017-11-12 LAB — MYOCARDIAL PERFUSION IMAGING
CHL CUP NUCLEAR SSS: 0
CHL CUP RESTING HR STRESS: 53 {beats}/min
LVDIAVOL: 69 mL (ref 46–106)
LVSYSVOL: 23 mL
NUC STRESS TID: 0.93
Peak HR: 84 {beats}/min
SDS: 0
SRS: 0

## 2017-11-12 MED ORDER — TECHNETIUM TC 99M TETROFOSMIN IV KIT
10.2000 | PACK | Freq: Once | INTRAVENOUS | Status: AC | PRN
  Administered 2017-11-12: 10.2 via INTRAVENOUS
  Filled 2017-11-12: qty 11

## 2017-11-12 MED ORDER — TECHNETIUM TC 99M TETROFOSMIN IV KIT
31.5000 | PACK | Freq: Once | INTRAVENOUS | Status: AC | PRN
  Administered 2017-11-12: 31.5 via INTRAVENOUS
  Filled 2017-11-12: qty 32

## 2017-11-12 MED ORDER — REGADENOSON 0.4 MG/5ML IV SOLN
0.4000 mg | Freq: Once | INTRAVENOUS | Status: AC
  Administered 2017-11-12: 0.4 mg via INTRAVENOUS

## 2017-11-12 NOTE — Telephone Encounter (Signed)
Copied from [place] [id number]. Topic: Quick Communication - See Telephone Encounter >> Nov 12, 2017  4:42 PM Neva Seat wrote: Pt wanting to get orders for her kidney and diabetic tests done asap.

## 2017-11-16 NOTE — Telephone Encounter (Signed)
Patient had hemoglobin A1c done less than 2 months ago which was normal and in a nondiabetic range. Kidney function studies were done 12 days ago and were normal

## 2017-11-16 NOTE — Telephone Encounter (Signed)
Please advise 

## 2017-11-17 NOTE — Telephone Encounter (Signed)
Spoke to pt and she stated that she went into Afib during surgery and cardiologist wanted tot run test. Pt stated that the test they ran came back normal and they stated that the kidney and A1c test was left for her to check. Pt was notified that the test were already done and normal. Pt verbalized understanding.

## 2017-11-18 ENCOUNTER — Other Ambulatory Visit: Payer: Self-pay

## 2017-11-18 MED ORDER — LOSARTAN POTASSIUM 100 MG PO TABS: 100.0000 mg | ORAL_TABLET | Freq: Every day | ORAL | 3 refills | Status: DC

## 2017-11-22 ENCOUNTER — Encounter: Payer: Self-pay | Admitting: Cardiology

## 2017-11-22 ENCOUNTER — Ambulatory Visit: Payer: Medicare Other | Admitting: Physical Therapy

## 2017-11-23 ENCOUNTER — Encounter: Payer: Self-pay | Admitting: Cardiology

## 2017-11-23 ENCOUNTER — Ambulatory Visit (INDEPENDENT_AMBULATORY_CARE_PROVIDER_SITE_OTHER): Payer: Medicare Other | Admitting: Cardiology

## 2017-11-23 VITALS — BP 142/72 | HR 55 | Ht 65.0 in | Wt 199.0 lb

## 2017-11-23 DIAGNOSIS — I480 Paroxysmal atrial fibrillation: Secondary | ICD-10-CM | POA: Diagnosis not present

## 2017-11-23 DIAGNOSIS — E785 Hyperlipidemia, unspecified: Secondary | ICD-10-CM | POA: Diagnosis not present

## 2017-11-23 DIAGNOSIS — G4733 Obstructive sleep apnea (adult) (pediatric): Secondary | ICD-10-CM | POA: Diagnosis not present

## 2017-11-23 DIAGNOSIS — I10 Essential (primary) hypertension: Secondary | ICD-10-CM | POA: Diagnosis not present

## 2017-11-23 MED ORDER — DRONEDARONE HCL 400 MG PO TABS: 400.0000 mg | ORAL_TABLET | Freq: Two times a day (BID) | ORAL | 3 refills | Status: DC

## 2017-11-23 NOTE — Progress Notes (Signed)
Electrophysiology Office Note   Date:  11/23/2017   ID:  Miranda [name], Miranda [name] [birth year], MRN [id number]  PCP:  Marletta Lor, MD  Cardiologist:  [name] Primary Electrophysiologist:  [name], MD    No chief complaint on file.    History of Present Illness: Miranda [name] is a 72 y.o. female who is being seen today for the evaluation of atrial fibrillation at the request of [name]. Presenting today for electrophysiology evaluation.  She has a history of depression, GERD, hypertension, hyperlipidemia, OSA on CPAP, type 2 diabetes, and recently diagnosed atrial fibrillation.  She was diagnosed with a PE February 2018 and was started on Xarelto for 6 months.  High-resolution CT showed interstitial lung disease as well as aortic atherosclerosis and a two-vessel coronary artery disease.  She had a recent echo that showed a normal ejection fraction.  She was seen in the emergency room 09/14/2017 after being found in atrial fibrillation with rapid rates.    Today, she denies symptoms of palpitations, chest pain, shortness of breath, orthopnea, PND, lower extremity edema, claudication, dizziness, presyncope, syncope, bleeding, or neurologic sequela. The patient is tolerating medications without difficulties.    Past Medical History:  Diagnosis Date  . Anxiety   . Arthritis   . Asthma   . Atrial fibrillation ([place])   . CHF (congestive heart failure) ([place])    pt. unsure- but thinks she was hosp. for CHF- 2002  . [facility] disease    [facility]  . Clotting disorder ([place])    blood clots in lungs/PE pulmonary embolism  . Colon polyps   . Complication of anesthesia    states requires a lot med. to put her to sleep   . DDD (degenerative disc disease) 09/17/2011  . Depression    "sometimes "  . DM (diabetes mellitus) ([place])   . Family history of anesthesia complication   . GERD (gastroesophageal reflux disease)   . Glaucoma    bilateral, pt. admits that  she is noncompliant to eye gtts.   . Hemorrhoids   . Hyperlipidemia   . Hypertension    had stress, echo- 2006 /w [place], Cardiac Cath, per pt. 2002, echo repeated 2012- wnl   . Low back pain   . Shortness of breath   . Sleep apnea    uses c-pap- q night recently   Past Surgical History:  Procedure Laterality Date  . ABDOMINAL HYSTERECTOMY     ectopic, fibroids  . arm surgery Right   . CARDIAC CATHETERIZATION    . CATARACT EXTRACTION, BILATERAL     cataracts removed bilateral- ?IOL  . COLONOSCOPY     remote  . FISSURECTOMY  10/08/2011   Procedure: FISSURECTOMY;  Surgeon: Stark Klein, MD;  Location: [facility];  Service: General;  Laterality: N/A;  . FLEXIBLE SIGMOIDOSCOPY  02/25/2011   Procedure: FLEXIBLE SIGMOIDOSCOPY;  Surgeon: Inda Castle, MD;  Location: [facility];  Service: Endoscopy;  Laterality: N/A;  . FOOT SURGERY     bilat, heel spurs- screw in R foot   . HEMORRHOID SURGERY  10/08/2011   Procedure: HEMORRHOIDECTOMY;  Surgeon: Stark Klein, MD;  Location: [facility];  Service: General;  Laterality: N/A;  External   . SPHINCTEROTOMY  10/08/2011   Procedure: Joan Mayans;  Surgeon: Stark Klein, MD;  Location: [facility];  Service: General;  Laterality: N/A;     Current Outpatient Medications  Medication Sig Dispense Refill  . ALPRAZolam (XANAX) 0.25 MG tablet Take 1  tablet (0.25 mg total) by mouth 3 (three) times daily as needed for anxiety or sleep. 30 tablet 5  . amLODipine (NORVASC) 10 MG tablet Take 1 tablet (10 mg total) by mouth daily. 90 tablet 1  . atorvastatin (LIPITOR) 20 MG tablet Take 1 tablet (20 mg total) by mouth daily. 90 tablet 3  . cyclobenzaprine (FLEXERIL) 10 MG tablet Take 1 tablet (10 mg total) by mouth 3 (three) times daily as needed. (Patient taking differently: Take 10 mg by mouth 3 (three) times daily as needed for muscle spasms. ) 90 tablet 1  . diclofenac sodium (VOLTAREN) 1 % GEL Apply 2 g topically 4 (four) times daily. 1 Tube 1  . fluticasone  (FLONASE) 50 MCG/ACT nasal spray Place 1 spray into both nostrils daily. (Patient taking differently: Place 1 spray into both nostrils daily as needed for allergies. ) 16 g 0  . furosemide (LASIX) 40 MG tablet Take 0.5 tablets (20 mg total) by mouth daily. 90 tablet 2  . HYDROcodone-acetaminophen (NORCO) 10-325 MG tablet Take 1 tablet by mouth every 6 (six) hours as needed for moderate pain. Refill 1 month 90 tablet 0  . lactulose (CONSTULOSE) 10 GM/15ML solution TAKE 30 MILLILITERS THREE TIMES DAILY BY  MOUTH AS NEEDED FOR MILD CONSTIPATION 473 mL 4  . losartan (COZAAR) 100 MG tablet Take 1 tablet (100 mg total) by mouth daily. 90 tablet 3  . metFORMIN (GLUCOPHAGE-XR) 500 MG 24 hr tablet Take 2 tablets (1,000 mg total) by mouth daily with breakfast. 180 tablet 3  . metoprolol tartrate (LOPRESSOR) 25 MG tablet Take 1 tablet (25 mg total) by mouth 2 (two) times daily. 180 tablet 6  . Pirfenidone (ESBRIET) 801 MG TABS Take 1 tablet by mouth 3 (three) times daily. 90 tablet 11  . rivaroxaban (XARELTO) 20 MG TABS tablet Take 1 tablet (20 mg total) by mouth daily with supper. 30 tablet 6  . dronedarone (MULTAQ) 400 MG tablet Take 1 tablet (400 mg total) by mouth 2 (two) times daily with a meal. 60 tablet 3   No current facility-administered medications for this visit.     Allergies:   Fish oil; Other; Penicillins; Pneumococcal vaccines; Nitrofurantoin macrocrystal; Aspirin; Bactrim [sulfamethoxazole-trimethoprim]; Ciprofloxacin; Ibuprofen; Influenza vaccines; Ivp dye [iodinated diagnostic agents]; Latex; Macrobid [nitrofurantoin monohydrate macrocrystals]; and Shellfish allergy   Social History:  The patient  reports that she has never smoked. She has never used smokeless tobacco. She reports that she does not drink alcohol or use drugs.   Family History:  The patient's family history includes Arthritis/Rheumatoid in her sister; Asthma in her sister; Breast cancer in her mother and sister; COPD in her  sister; Colon cancer in her brother and brother; Emphysema in her sister; Heart attack in her mother; Heart disease in her mother; Lung cancer in her sister.    ROS:  Please see the history of present illness.   Otherwise, review of systems is positive for pain, shortness of breath, leg pain, cough, abdominal pain, anxiety, back pain, muscle pain, balance problems, headaches.   All other systems are reviewed and negative.    PHYSICAL EXAM: VS:  BP (!) 142/72   Pulse (!) 55   Ht 5\' 5"  (1.651 m)   Wt 199 lb (90.3 kg)   BMI 33.12 kg/m  , BMI Body mass index is 33.12 kg/m. GEN: Well nourished, well developed, in no acute distress  HEENT: normal  Neck: no JVD, carotid bruits, or masses Cardiac: RRR; no murmurs, rubs, or  gallops,no edema  Respiratory:  clear to auscultation bilaterally, normal work of breathing GI: soft, nontender, nondistended, + BS MS: no deformity or atrophy  Skin: warm and dry Neuro:  Strength and sensation are intact Psych: euthymic mood, full affect  EKG:  EKG is ordered today. Personal review of the ekg ordered shows sinus rhythm, rate 55  Recent Labs: 06/09/2017: ALT 16 09/14/2017: B Natriuretic Peptide 29.7; Magnesium 2.3; TSH 1.499 11/02/2017: Hemoglobin 14.6; Platelets 185 11/04/2017: BUN 6; Creatinine, Ser 0.66; Potassium 4.1; Sodium 142    Lipid Panel     Component Value Date/Time   CHOL 195 06/01/2014 1145   TRIG 84.0 06/01/2014 1145   HDL 55.10 06/01/2014 1145   CHOLHDL 4 06/01/2014 1145   VLDL 16.8 06/01/2014 1145   LDLCALC 123 (H) 06/01/2014 1145   LDLDIRECT 152.8 03/30/2011 0859     Wt Readings from Last 3 Encounters:  11/23/17 199 lb (90.3 kg)  11/12/17 198 lb (89.8 kg)  11/03/17 198 lb 11.2 oz (90.1 kg)      Other studies Reviewed: Additional studies/ records that were reviewed today include: TTE 10/19/17  Review of the above records today demonstrates:  - Left ventricle: The cavity size was normal. Systolic function was   normal. The  estimated ejection fraction was in the range of 60%   to 65%. Wall motion was normal; there were no regional wall   motion abnormalities. Doppler parameters are consistent with   abnormal left ventricular relaxation (grade 1 diastolic   dysfunction). - Aortic valve: There was no significant regurgitation. - Mitral valve: There was trivial regurgitation. - Left atrium: The atrium was mildly dilated. - Right ventricle: The cavity size was mildly dilated. Wall   thickness was normal. - Tricuspid valve: There was trivial regurgitation. - Pulmonic valve: There was trivial regurgitation. - Inferior vena cava: The vessel was normal in size. The   respirophasic diameter changes were in the normal range (>= 50%),   consistent with normal central venous pressure.  Myoview 11/12/17  Nuclear stress EF: 66%.  There was no ST segment deviation noted during stress.  No T wave inversion was noted during stress.  The study is normal.  This is a low risk study.  The left ventricular ejection fraction is hyperdynamic (>65%).  ASSESSMENT AND PLAN:  1.  Paroxysmal atrial fibrillation: Currently on Xarelto and metoprolol for rate control.  Tolerating her medications without issue.  She remains in sinus rhythm today.  She has had issues with atrial fibrillation causing her chest pain palpitations in the past.  She had a low risk Myoview.  She had a CT scan 05/08/2016 that showed coronary disease in the circumflex and LAD.  Due to that, we Mikaia Janvier hold off on flecainide and start Multitak.  This patients CHA2DS2-VASc Score and unadjusted Ischemic Stroke Rate (% per year) is equal to 4.8 % stroke rate/year from a score of 4  Above score calculated as 1 point each if present [CHF, HTN, DM, Vascular=MI/PAD/Aortic Plaque, Age if 65-74, or Female] Above score calculated as 2 points each if present [Age > 75, or Stroke/TIA/TE]  2.  Hypertension: Evaded today but was low normal in August.  No changes.  3.   Hyperlipidemia: Continue Lipitor.  She does have coronary artery disease and has a goal LDL of 70.  She has not had a fasting lipid panel through our clinic since 2016.  Talullah Abate order one today.    4.  Obstructive sleep apnea: Encouraged CPAP compliance  5.  Coronary artery disease: Found on CT scan 05/08/2016.  Has calcified plaques in the LAD and circumflex.  Has had a low risk Myoview.  No changes at this time.  Current medicines are reviewed at length with the patient today.   The patient does not have concerns regarding her medicines.  The following changes were made today:  none  Labs/ tests ordered today include:  Orders Placed This Encounter  Procedures  . Lipid Profile  . EKG 12-Lead     Disposition:   FU with Kathy Wahid 3 months  Signed, Djuan Talton Meredith Leeds, MD  11/23/2017 2:53 PM     [facility] [address] [place]  [address] [phone] (office) [phone] (fax)

## 2017-11-23 NOTE — Patient Instructions (Addendum)
Medication Instructions:  Your physician has recommended you make the following change in your medication:  1. START Multaq 400 mg twice daily  * If you need a refill on your cardiac medications before your next appointment, please call your pharmacy.   Labwork: Your physician recommends that you return for lab work: fasting Lipid profile  Testing/Procedures: None ordered  Follow-Up: Your physician recommends that you schedule a follow-up appointment in: 3 months with Dr. Curt Bears.  *Please note that any paperwork needing to be filled out by the provider will need to be addressed at the front desk prior to seeing the provider. Please note that any FMLA, disability or other documents regarding health condition is subject to a $25.00 charge that must be received prior to completion of paperwork in the form of a money order or check.  Thank you for choosing [facility]!!   Trinidad Curet, RN [phone]  Any Other Special Instructions Will Be Listed Below (If Applicable).  Atrial Fibrillation Atrial fibrillation is a type of irregular or rapid heartbeat (arrhythmia). In atrial fibrillation, the heart quivers continuously in a chaotic pattern. This occurs when parts of the heart receive disorganized signals that make the heart unable to pump blood normally. This can increase the risk for stroke, heart failure, and other heart-related conditions. There are different types of atrial fibrillation, including:  Paroxysmal atrial fibrillation. This type starts suddenly, and it usually stops on its own shortly after it starts.  Persistent atrial fibrillation. This type often lasts longer than a week. It may stop on its own or with treatment.  Long-lasting persistent atrial fibrillation. This type lasts longer than 12 months.  Permanent atrial fibrillation. This type does not go away.  Talk with your health care provider to learn about the type of atrial fibrillation that you have. What  are the causes? This condition is caused by some heart-related conditions or procedures, including:  A heart attack.  Coronary artery disease.  Heart failure.  Heart valve conditions.  High blood pressure.  Inflammation of the sac that surrounds the heart (pericarditis).  Heart surgery.  Certain heart rhythm disorders, such as Wolf-Parkinson-White syndrome.  Other causes include:  Pneumonia.  Obstructive sleep apnea.  Blockage of an artery in the lungs (pulmonary embolism, or PE).  Lung cancer.  Chronic lung disease.  Thyroid problems, especially if the thyroid is overactive (hyperthyroidism).  Caffeine.  Excessive alcohol use or illegal drug use.  Use of some medicines, including certain decongestants and diet pills.  Sometimes, the cause cannot be found. What increases the risk? This condition is more likely to develop in:  People who are older in age.  People who smoke.  People who have diabetes mellitus.  People who are overweight (obese).  Athletes who exercise vigorously.  What are the signs or symptoms? Symptoms of this condition include:  A feeling that your heart is beating rapidly or irregularly.  A feeling of discomfort or pain in your chest.  Shortness of breath.  Sudden light-headedness or weakness.  Getting tired easily during exercise.  In some cases, there are no symptoms. How is this diagnosed? Your health care provider may be able to detect atrial fibrillation when taking your pulse. If detected, this condition may be diagnosed with:  An electrocardiogram (ECG).  A Holter monitor test that records your heartbeat patterns over a 24-hour period.  Transthoracic echocardiogram (TTE) to evaluate how blood flows through your heart.  Transesophageal echocardiogram (TEE) to view more detailed images of your  heart.  A stress test.  Imaging tests, such as a CT scan or chest X-ray.  Blood tests.  How is this treated? The main  goals of treatment are to prevent blood clots from forming and to keep your heart beating at a normal rate and rhythm. The type of treatment that you receive depends on many factors, such as your underlying medical conditions and how you feel when you are experiencing atrial fibrillation. This condition may be treated with:  Medicine to slow down the heart rate, bring the heart's rhythm back to normal, or prevent clots from forming.  Electrical cardioversion. This is a procedure that resets your heart's rhythm by delivering a controlled, low-energy shock to the heart through your skin.  Different types of ablation, such as catheter ablation, catheter ablation with pacemaker, or surgical ablation. These procedures destroy the heart tissues that send abnormal signals. When the pacemaker is used, it is placed under your skin to help your heart beat in a regular rhythm.  Follow these instructions at home:  Take over-the counter and prescription medicines only as told by your health care provider.  If your health care provider prescribed a blood-thinning medicine (anticoagulant), take it exactly as told. Taking too much blood-thinning medicine can cause bleeding. If you do not take enough blood-thinning medicine, you will not have the protection that you need against stroke and other problems.  Do not use tobacco products, including cigarettes, chewing tobacco, and e-cigarettes. If you need help quitting, ask your health care provider.  If you have obstructive sleep apnea, manage your condition as told by your health care provider.  Do not drink alcohol.  Do not drink beverages that contain caffeine, such as coffee, soda, and tea.  Maintain a healthy weight. Do not use diet pills unless your health care provider approves. Diet pills may make heart problems worse.  Follow diet instructions as told by your health care provider.  Exercise regularly as told by your health care provider.  Keep all  follow-up visits as told by your health care provider. This is important. How is this prevented?  Avoid drinking beverages that contain caffeine or alcohol.  Avoid certain medicines, especially medicines that are used for breathing problems.  Avoid certain herbs and herbal medicines, such as those that contain ephedra or ginseng.  Do not use illegal drugs, such as cocaine and amphetamines.  Do not smoke.  Manage your high blood pressure. Contact a health care provider if:  You notice a change in the rate, rhythm, or strength of your heartbeat.  You are taking an anticoagulant and you notice increased bruising.  You tire more easily when you exercise or exert yourself. Get help right away if:  You have chest pain, abdominal pain, sweating, or weakness.  You feel nauseous.  You notice blood in your vomit, bowel movement, or urine.  You have shortness of breath.  You suddenly have swollen feet and ankles.  You feel dizzy.  You have sudden weakness or numbness of the face, arm, or leg, especially on one side of the body.  You have trouble speaking, trouble understanding, or both (aphasia).  Your face or your eyelid droops on one side. These symptoms may represent a serious problem that is an emergency. Do not wait to see if the symptoms will go away. Get medical help right away. Call your local emergency services (911 in the [place] Do not drive yourself to the hospital. This information is not intended to replace advice given  to you by your health care provider. Make sure you discuss any questions you have with your health care provider. Document Released: 03/02/2005 Document Revised: 07/10/2015 Document Reviewed: 06/27/2014 Elsevier Interactive Patient Education  2018 [employer].    Dronedarone tablets What is this medicine? DRONEDARONE (droe NE da rone) is an antiarrhythmic drug. It helps make your heart beat regularly. This medicine may be used for other purposes;  ask your health care provider or pharmacist if you have questions. COMMON BRAND NAME(S): Multaq What should I tell my health care provider before I take this medicine? They need to know if you have any of these conditions: -heart failure -history of irregular heartbeat -liver disease -liver or lung problems with the past use of amiodarone -low levels of magnesium in the blood -low levels of potassium in the blood -other heart disease -an unusual or allergic reaction to dronedarone, other medicines, foods, dyes, or preservatives -pregnant or trying to get pregnant -breast-feeding How should I use this medicine? Take this medicine by mouth with a glass of water. Follow the directions on the prescription label. Take one tablet with the morning meal and one tablet with the evening meal. Do not take your medicine more often than directed. Do not stop taking except on the advice of your doctor or health care professional. A special MedGuide will be given to you by the pharmacist with each prescription and refill. Be sure to read this information carefully each time. Talk to your pediatrician regarding the use of this medicine in children. Special care may be needed. Overdosage: If you think you have taken too much of this medicine contact a poison control center or emergency room at once. NOTE: This medicine is only for you. Do not share this medicine with others. What if I miss a dose? If you miss a dose, take it as soon as you can. If it is almost time for your next dose, take only that dose. Do not take double or extra doses. What may interact with this medicine? Do not take this medicine with any of the following medications: -arsenic trioxide -certain antibiotics like clarithromycin, erythromycin, pentamidine, telithromycin, troleandomycin -certain medicines for depression like tricyclic antidepressants -certain medicines for fungal infections like fluconazole, itraconazole, ketoconazole,  posaconazole, voriconazole -certain medicines for irregular heart beat like amiodarone, disopyramide, dofetilide, flecainide, ibutilide, quinidine, propafenone, sotalol -certain medicines for malaria like chloroquine, halofantrine -cisapride -cyclosporine -droperidol -haloperidol -methadone -other medicines that prolong the QT interval (cause an abnormal heart rhythm) -pimozide -nefazodone -phenothiazines like chlorpromazine, mesoridazine, prochlorperazine, thioridazine -ritonavir -ziprasidone This medicine may also interact with the following medications: -certain medicines for blood pressure, heart disease, or irregular heart beat like diltiazem, metoprolol, propranolol, verapamil -certain medicines for cholesterol like atorvastatin, lovastatin, simvastatin -certain medicines for seizures like carbamazepine, phenobarbital, phenytoin -digoxin -grapefruit juice -rifampin -sirolimus -St. John's Wort -tacrolimus This list may not describe all possible interactions. Give your health care provider a list of all the medicines, herbs, non-prescription drugs, or dietary supplements you use. Also tell them if you smoke, drink alcohol, or use illegal drugs. Some items may interact with your medicine. What should I watch for while using this medicine? Your condition will be monitored closely when you first begin therapy. Often, this drug is first started in a hospital or other monitored health care setting. Once you are on maintenance therapy, visit your doctor or health care professional for regular checks on your progress. Because your condition and use of this medicine carry some risk, it is  a good idea to carry an identification card, necklace or bracelet with details of your condition, medications, and doctor or health care professional. Dennis Bast may get drowsy or dizzy. Do not drive, use machinery, or do anything that needs mental alertness until you know how this medicine affects you. Do not stand  or sit up quickly, especially if you are an older patient. This reduces the risk of dizzy or fainting spells. What side effects may I notice from receiving this medicine? Side effects that you should report to your doctor or health care professional as soon as possible: -allergic reactions like skin rash, itching or hives, swelling of the face, lips, or tongue -breathing problems -cough -dark urine -fast, irregular heartbeat -general ill feeling or flu-like symptoms -light-colored stools -loss of appetite, nausea -right upper belly pain -slow heartbeat -stomach pain -swelling of the legs or ankles -unusually weak or tired -weight gain -yellowing of the eyes or skin Side effects that usually do not require medical attention (report to your doctor or health care professional if they continue or are bothersome): -nausea -vomiting -stomach pain This list may not describe all possible side effects. Call your doctor for medical advice about side effects. You may report side effects to FDA at 1-800-FDA-1088. Where should I keep my medicine? Keep out of the reach of children. Store at room temperature between 15 and 30 degrees C (59 and 86 degrees F). Throw away any unused medicine after the expiration date. NOTE: This sheet is a summary. It may not cover all possible information. If you have questions about this medicine, talk to your doctor, pharmacist, or health care provider.  2018 Elsevier/Gold Standard (2015-04-04 12:43:06)

## 2017-11-26 ENCOUNTER — Other Ambulatory Visit: Payer: Medicare Other | Admitting: *Deleted

## 2017-11-26 DIAGNOSIS — E785 Hyperlipidemia, unspecified: Secondary | ICD-10-CM

## 2017-11-26 LAB — LIPID PANEL
CHOL/HDL RATIO: 2.7 ratio (ref 0.0–4.4)
Cholesterol, Total: 167 mg/dL (ref 100–199)
HDL: 61 mg/dL (ref >39)
LDL CALC: 91 mg/dL (ref 0–99)
TRIGLYCERIDES: 75 mg/dL (ref 0–149)
VLDL Cholesterol Cal: 15 mg/dL (ref 5–40)

## 2017-11-29 ENCOUNTER — Ambulatory Visit: Payer: Medicare Other | Attending: Internal Medicine | Admitting: Physical Therapy

## 2017-11-29 ENCOUNTER — Encounter: Payer: Self-pay | Admitting: Physical Therapy

## 2017-11-29 DIAGNOSIS — R600 Localized edema: Secondary | ICD-10-CM | POA: Diagnosis present

## 2017-11-29 DIAGNOSIS — G8929 Other chronic pain: Secondary | ICD-10-CM

## 2017-11-29 DIAGNOSIS — M6281 Muscle weakness (generalized): Secondary | ICD-10-CM

## 2017-11-29 DIAGNOSIS — M25612 Stiffness of left shoulder, not elsewhere classified: Secondary | ICD-10-CM | POA: Insufficient documentation

## 2017-11-29 DIAGNOSIS — M25512 Pain in left shoulder: Secondary | ICD-10-CM | POA: Insufficient documentation

## 2017-11-29 NOTE — Therapy (Signed)
[place], [place], [address] Phone: [phone]   Fax:  [phone]  Physical Therapy Treatment  Patient Details  Name: Miranda [name] MRN: [id number] Date of Birth: Jul 31, 1945 Referring Provider: Marzetta Board MD   Encounter Date: 11/29/2017  PT End of Session - 11/29/17 1450    Visit Number  2    Number of Visits  13    Date for PT Re-Evaluation  12/23/17    Authorization Type  MCR (Kx mod by 15th visit and progress note by 10th)    PT Start Time  1501    PT Stop Time  1542    PT Time Calculation (min)  41 min    Activity Tolerance  Patient tolerated treatment well    Behavior During Therapy  [facility] for tasks assessed/performed       Past Medical History:  Diagnosis Date  . Anxiety   . Arthritis   . Asthma   . Atrial fibrillation ([place])   . CHF (congestive heart failure) ([place])    pt. unsure- but thinks she was hosp. for CHF- 2002  . [facility] kidney disease    [facility]  . Clotting disorder ([place])    blood clots in lungs/PE pulmonary embolism  . Colon polyps   . Complication of anesthesia    states requires a lot med. to put her to sleep   . DDD (degenerative disc disease) 09/17/2011  . Depression    "sometimes "  . DM (diabetes mellitus) ([place])   . Family history of anesthesia complication   . GERD (gastroesophageal reflux disease)   . Glaucoma    bilateral, pt. admits that she is noncompliant to eye gtts.   . Hemorrhoids   . Hyperlipidemia   . Hypertension    had stress, echo- 2006 /w [place], Cardiac Cath, per pt. 2002, echo repeated 2012- wnl   . Low back pain   . Shortness of breath   . Sleep apnea    uses c-pap- q night recently    Past Surgical History:  Procedure Laterality Date  . ABDOMINAL HYSTERECTOMY     ectopic, fibroids  . arm surgery Right   . CARDIAC CATHETERIZATION    . CATARACT EXTRACTION, BILATERAL     cataracts removed bilateral- ?IOL  . COLONOSCOPY      remote  . FISSURECTOMY  10/08/2011   Procedure: FISSURECTOMY;  Surgeon: Stark Klein, MD;  Location: [place];  Service: General;  Laterality: N/A;  . FLEXIBLE SIGMOIDOSCOPY  02/25/2011   Procedure: FLEXIBLE SIGMOIDOSCOPY;  Surgeon: Inda Castle, MD;  [facility];  Service: Endoscopy;  Laterality: N/A;  . FOOT SURGERY     bilat, heel spurs- screw in R foot   . HEMORRHOID SURGERY  10/08/2011   Procedure: HEMORRHOIDECTOMY;  Surgeon: Stark Klein, MD;  Location: [place];  Service: General;  Laterality: N/A;  External   . SPHINCTEROTOMY  10/08/2011   Procedure: Joan Mayans;  Surgeon: Stark Klein, MD;  Location: [place];  Service: General;  Laterality: N/A;    There were no vitals filed for this visit.  Subjective Assessment - 11/29/17 1457    Subjective  "I've been doing okay, I do my exercise every other day. My pain is a 6/10"    Currently in Pain?  Yes    Pain Score  6     Pain Orientation  Left    Pain Descriptors / Indicators  Sore    Pain Type  Chronic pain    Pain Onset  More than a month ago    Pain Frequency  Intermittent    Aggravating Factors   laying down, lifting     Pain Relieving Factors  ice,                        [facility] Adult PT Treatment/Exercise - 11/29/17 1501      Exercises   Exercises  Shoulder      Shoulder Exercises: Supine   Protraction  Strengthening;Both;10 reps   with dowel rod   Horizontal ABduction  Both;10 reps;Theraband   x 2 sets   Theraband Level (Shoulder Horizontal ABduction)  Level 1 (Yellow)      Shoulder Exercises: Standing   External Rotation  Strengthening;Left;10 reps;Theraband    Theraband Level (Shoulder External Rotation)  Level 1 (Yellow)    Internal Rotation  10 reps;Strengthening;Left;Theraband    Theraband Level (Shoulder Internal Rotation)  Level 1 (Yellow)    Row  10 reps;Theraband;Both;Strengthening   x 2 sets   Theraband Level (Shoulder Row)  Level 1 (Yellow)      Shoulder Exercises:  ROM/Strengthening   Other ROM/Strengthening Exercises  Upper extremity ranger flexion 2 x 10 / abduction 2 x 10    verbal cues to avoid hiking shoulder frequently   Other ROM/Strengthening Exercises  supine flexion with dowel rod 2 x 10 working into end ranges      Shoulder Exercises: Stretch   Other Shoulder Stretches  upper trap stretch 2 x 30    cues to avoid over stretching and keeping shoulder down     Manual Therapy   Manual therapy comments  MTPR along the infraspinatus on the L             PT Education - 11/29/17 1532    Education Details  reviewed previously provided HEP and updated HEP.     Person(s) Educated  Patient    Methods  Explanation;Verbal cues;Handout;Demonstration    Comprehension  Verbalized understanding;Verbal cues required       PT Short Term Goals - 11/11/17 1712      PT SHORT TERM GOAL #1   Title  pt to be I with inital HEP    Time  3    Period  Weeks    Status  New    Target Date  12/02/17      PT SHORT TERM GOAL #2   Title  pt to verbalize/ demo techniques for proper posture to prevent L shoulder pain     Time  3    Period  Weeks    Status  New    Target Date  12/02/17      PT SHORT TERM GOAL #3   Title  increase L shoulder AAROM flexion/ abduction to >/= 90 degrees with </= 6/10 pain for functional and therapuetic progression    Time  3    Period  Weeks    Status  New    Target Date  12/02/17        PT Long Term Goals - 11/11/17 1714      PT LONG TERM GOAL #1   Title  increase L shoulder flexion/ abduction to >/= 120 and IR/ER to >/= 50 degrees with </= 5/10 pain for functional ROm for ADLs    Time  6    Period  Weeks    Status  New    Target Date  12/23/17  PT LONG TERM GOAL #2   Title  pt to increase L shoulder strength to >/= 4/5 in all planes to promote shoulder stability with household lifting activities     Time  6    Period  Weeks    Status  New    Target Date  12/23/17      PT LONG TERM GOAL #3   Title   pt to be able to lift and carry >/= 8# for functional lifting activitys and for pt's goal of return to bowling with </= 4/10 pain     Time  6    Period  Weeks    Status  New    Target Date  12/23/17      PT LONG TERM GOAL #4   Title  pt to be I with intial HEp given as of last visit    Time  6    Period  Weeks    Status  New    Target Date  12/23/17            Plan - 11/29/17 1532    Clinical Impression Statement  pt reports some consistency with her HEP and that she feels she is doing better but conitnues to have pain with reaching up and sleeping. continued soreness noted at the greater tubercle and along the infraspinatus. She may continue to have rotator cuff involvement and may benefit from seeing an orthopedic MD. continued progression of strengtheing and shoulder ROM which she perfomed with intermittent soreness/ pain.     PT Treatment/Interventions  ADLs/Self Care Home Management;Cryotherapy;Electrical Stimulation;Iontophoresis 4mg /ml Dexamethasone;Moist Heat;Ultrasound;Taping;Vasopneumatic Device;Manual techniques;Therapeutic exercise;Therapeutic activities;Passive range of motion;Dry needling;Gait training;Patient/family education    PT Next Visit Plan   shoulder PROM/ AAROM, Soft tissue work, upper trap stretching, scapular setting, modalities PRN     PT Home Exercise Plan  table slides flexion/ abduction, upper trap stretching, scapular retraction, internal rotation/ external rotation, rows, and wall walks flexion    Consulted and Agree with Plan of Care  Patient       Patient will benefit from skilled therapeutic intervention in order to improve the following deficits and impairments:  Decreased activity tolerance, Decreased endurance, Decreased range of motion, Pain, Postural dysfunction, Impaired flexibility, Increased muscle spasms, Increased fascial restricitons, Decreased strength, Increased edema  Visit Diagnosis: Chronic left shoulder pain  Stiffness of left  shoulder, not elsewhere classified  Muscle weakness (generalized)  Localized edema     Problem List Patient Active Problem List   Diagnosis Date Noted  . Atrial fibrillation with RVR ([place]) 11/03/2017  . LVH (left ventricular hypertrophy) 09/22/2017  . Diabetes mellitus without complication ([place]) 16/12/9602  . Degeneration of lumbar intervertebral disc 07/06/2017  . Pulmonary emboli ([place]) 05/02/2016  . Right leg pain 05/01/2016  . ILD (interstitial lung disease) ([place]) 05/01/2016  . Bradycardia 05/01/2016  . Coronary artery calcification seen on CAT scan 08/06/2015  . Open angle with borderline findings, low risk, bilateral 04/29/2015  . UIP (usual interstitial pneumonitis) ([place]) 04/05/2015  . Bilateral posterior capsular opacification 09/21/2014  . Pseudophakia of both eyes 07/27/2014  . Combined form of senile cataract of both eyes 05/30/2014  . Osteoarthritis 09/17/2011  . Narrowing of intervertebral disc space 09/17/2011  . Pyelonephritis with possible nonobstructing 5 mm calculus by renal ultrasound 09/16/2011  . Hypokalemia 09/16/2011  . External hemorrhoids 09/13/2011  . Obstructive sleep apnea syndrome 06/10/2011  . Anal fissure 02/17/2011  . History of colonic polyps 04/03/2010  . Respiratory condition due to  chemical fumes and vapors ([place]) 01/20/2010  . Urinary incontinence 04/30/2009  . Insomnia 09/28/2007  . Asthma 11/22/2006  . Dyslipidemia 08/19/2006  . HTN (hypertension) 08/19/2006  . GERD 08/19/2006  . Low back pain 08/19/2006   Starr Lake PT, DPT, LAT, ATC  11/29/17  3:49 PM      [facility] [address] [place], [place], [address] Phone: [phone]   Fax:  [phone]  Name: Miranda [name] MRN: [id number] Date of Birth: [birth year]

## 2017-12-01 ENCOUNTER — Encounter: Payer: Self-pay | Admitting: Internal Medicine

## 2017-12-01 ENCOUNTER — Ambulatory Visit (INDEPENDENT_AMBULATORY_CARE_PROVIDER_SITE_OTHER): Payer: Medicare Other | Admitting: Internal Medicine

## 2017-12-01 VITALS — BP 140/70 | HR 54 | Temp 98.0°F | Wt 199.0 lb

## 2017-12-01 DIAGNOSIS — M25512 Pain in left shoulder: Secondary | ICD-10-CM

## 2017-12-01 DIAGNOSIS — I10 Essential (primary) hypertension: Secondary | ICD-10-CM

## 2017-12-01 DIAGNOSIS — E119 Type 2 diabetes mellitus without complications: Secondary | ICD-10-CM | POA: Diagnosis not present

## 2017-12-01 DIAGNOSIS — G8929 Other chronic pain: Secondary | ICD-10-CM

## 2017-12-01 NOTE — Progress Notes (Signed)
Subjective:    Patient ID: Miranda Miranda [name], female    DOB: August 26, 1945, 72 y.o.   MRN: [id number]  HPI  72 year old patient who is seen today for evaluation of left shoulder pain.  This has been present for about 4 to 6 weeks.  This initially started when she was an inpatient for atrial fibrillation.  She was referred to physical therapy and has had 2 sessions.  She continues to have pain.  No history of trauma.  She states a number of years ago she required surgery for her Miranda [name] shoulder  Past Medical History:  Diagnosis Date  . Anxiety   . Arthritis   . Asthma   . Atrial fibrillation ([place])   . CHF (congestive heart failure) ([place])    pt. unsure- but thinks she was hosp. for CHF- 2002  . Chronic kidney disease    [facility]  . Clotting disorder ([place])    blood clots in lungs/PE pulmonary embolism  . Colon polyps   . Complication of anesthesia    states requires a lot med. to put her to sleep   . DDD (degenerative disc disease) 09/17/2011  . Depression    "sometimes "  . DM (diabetes mellitus) ([place])   . Family history of anesthesia complication   . GERD (gastroesophageal reflux disease)   . Glaucoma    bilateral, pt. admits that she is noncompliant to eye gtts.   . Hemorrhoids   . Hyperlipidemia   . Hypertension    had stress, echo- 2006 /w [place], Cardiac Cath, per pt. 2002, echo repeated 2012- wnl   . Low back pain   . Shortness of breath   . Sleep apnea    uses c-pap- q night recently     Social History   Socioeconomic History  . Marital status: Married    Spouse name: Not on file  . Number of children: 4  . Years of education: Not on file  . Highest education level: Not on file  Occupational History  . Occupation: retired Forensic psychologist: UNEMPLOYED  Social Needs  . Financial resource strain: Not on file  . Food insecurity:    Worry: Not on file    Inability: Not on file  . Transportation needs:    Medical: Not on file    Non-medical: Not on  file  Tobacco Use  . Smoking status: Never Smoker  . Smokeless tobacco: Never Used  Substance and Sexual Activity  . Alcohol use: No    Alcohol/week: 0.0 standard drinks  . Drug use: No  . Sexual activity: Never  Lifestyle  . Physical activity:    Days per week: Not on file    Minutes per session: Not on file  . Stress: Not on file  Relationships  . Social connections:    Talks on phone: Not on file    Gets together: Not on file    Attends religious service: Not on file    Active member of club or organization: Not on file    Attends meetings of clubs or organizations: Not on file    Relationship status: Not on file  . Intimate partner violence:    Fear of current or ex partner: Not on file    Emotionally abused: Not on file    Physically abused: Not on file    Forced sexual activity: Not on file  Other Topics Concern  . Not on file  Social History Narrative  . Not  on file    Past Surgical History:  Procedure Laterality Date  . ABDOMINAL HYSTERECTOMY     ectopic, fibroids  . arm surgery Miranda [name]   . CARDIAC CATHETERIZATION    . CATARACT EXTRACTION, BILATERAL     cataracts removed bilateral- ?IOL  . COLONOSCOPY     remote  . FISSURECTOMY  10/08/2011   Procedure: FISSURECTOMY;  Surgeon: Stark Klein, MD;  Location: [place];  Service: General;  Laterality: N/A;  . FLEXIBLE SIGMOIDOSCOPY  02/25/2011   Procedure: FLEXIBLE SIGMOIDOSCOPY;  Surgeon: Inda Castle, MD;  [facility];  Service: Endoscopy;  Laterality: N/A;  . FOOT SURGERY     bilat, heel spurs- screw in R foot   . HEMORRHOID SURGERY  10/08/2011   Procedure: HEMORRHOIDECTOMY;  Surgeon: Stark Klein, MD;  Location: [place];  Service: General;  Laterality: N/A;  External   . SPHINCTEROTOMY  10/08/2011   Procedure: Joan Mayans;  Surgeon: Stark Klein, MD;  [facility];  Service: General;  Laterality: N/A;    Family History  Problem Relation Age of Onset  . Heart attack Mother   . Heart disease  Mother   . Breast cancer Mother   . Emphysema Sister   . Breast cancer Sister   . Arthritis/Rheumatoid Sister   . Asthma Sister   . Lung cancer Sister   . COPD Sister   . Colon cancer Brother   . Colon cancer Brother   . Anesthesia problems Neg Hx     Allergies  Allergen Reactions  . Fish Oil Anaphylaxis  . Other Hives, Shortness Of Breath and Swelling    Allergic to cashew nuts and peanut oil.  Marland Kitchen Penicillins Anaphylaxis, Hives and Swelling    Has patient had a PCN reaction causing immediate rash, facial/tongue/throat swelling, SOB or lightheadedness with hypotension: Face swelling and hives started first, then swelling of the throat  Has patient had a PCN reaction causing severe rash involving mucus membranes or skin necrosis: Yes  Has patient had a PCN reaction that required hospitalization: No  Has patient had a PCN reaction occurring within the last 10 years: Yes  If all of the above answers are "NO", then may proceed with Cephalospor  . Pneumococcal Vaccines Nausea And Vomiting  . Nitrofurantoin Macrocrystal Hives  . Aspirin Itching and Rash  . Bactrim [Sulfamethoxazole-Trimethoprim] Hives, Itching and Rash  . Ciprofloxacin Hives, Itching and Rash  . Ibuprofen Rash  . Influenza Vaccines Hives  . Ivp Dye [Iodinated Diagnostic Agents] Hives, Itching and Rash    Gives benadryl to counteract symptoms  . Latex Rash  . Macrobid [Nitrofurantoin Monohydrate Macrocrystals] Hives  . Shellfish Allergy Hives    Patient also allergic to seafood    Current Outpatient Medications on File Prior to Visit  Medication Sig Dispense Refill  . ALPRAZolam (XANAX) 0.25 MG tablet Take 1 tablet (0.25 mg total) by mouth 3 (three) times daily as needed for anxiety or sleep. 30 tablet 5  . amLODipine (NORVASC) 10 MG tablet Take 1 tablet (10 mg total) by mouth daily. 90 tablet 1  . atorvastatin (LIPITOR) 20 MG tablet Take 1 tablet (20 mg total) by mouth daily. 90 tablet 3  . cyclobenzaprine  (FLEXERIL) 10 MG tablet Take 1 tablet (10 mg total) by mouth 3 (three) times daily as needed. (Patient taking differently: Take 10 mg by mouth 3 (three) times daily as needed for muscle spasms. ) 90 tablet 1  . diclofenac sodium (VOLTAREN) 1 % GEL Apply 2 g  topically 4 (four) times daily. 1 Tube 1  . dronedarone (MULTAQ) 400 MG tablet Take 1 tablet (400 mg total) by mouth 2 (two) times daily with a meal. 60 tablet 3  . fluticasone (FLONASE) 50 MCG/ACT nasal spray Place 1 spray into both nostrils daily. (Patient taking differently: Place 1 spray into both nostrils daily as needed for allergies. ) 16 g 0  . furosemide (LASIX) 40 MG tablet Take 0.5 tablets (20 mg total) by mouth daily. 90 tablet 2  . HYDROcodone-acetaminophen (NORCO) 10-325 MG tablet Take 1 tablet by mouth every 6 (six) hours as needed for moderate pain. Refill 1 month 90 tablet 0  . lactulose (CONSTULOSE) 10 GM/15ML solution TAKE 30 MILLILITERS THREE TIMES DAILY BY  MOUTH AS NEEDED FOR MILD CONSTIPATION 473 mL 4  . losartan (COZAAR) 100 MG tablet Take 1 tablet (100 mg total) by mouth daily. 90 tablet 3  . metFORMIN (GLUCOPHAGE-XR) 500 MG 24 hr tablet Take 2 tablets (1,000 mg total) by mouth daily with breakfast. 180 tablet 3  . metoprolol tartrate (LOPRESSOR) 25 MG tablet Take 1 tablet (25 mg total) by mouth 2 (two) times daily. 180 tablet 6  . Pirfenidone (ESBRIET) 801 MG TABS Take 1 tablet by mouth 3 (three) times daily. 90 tablet 11  . rivaroxaban (XARELTO) 20 MG TABS tablet Take 1 tablet (20 mg total) by mouth daily with supper. 30 tablet 6   No current facility-administered medications on file prior to visit.     BP 140/70 (BP Location: Miranda [name] Arm, Patient Position: Sitting, Cuff Size: Large)   Pulse (!) 54   Temp 98 F (36.7 C) (Oral)   Wt 199 lb (90.3 kg)   SpO2 98%   BMI 33.12 kg/m     Review of Systems  Constitutional: Negative.   HENT: Negative for congestion, dental problem, hearing loss, rhinorrhea, sinus  pressure, sore throat and tinnitus.   Eyes: Negative for pain, discharge and visual disturbance.  Respiratory: Negative for cough and shortness of breath.   Cardiovascular: Negative for chest pain, palpitations and leg swelling.  Gastrointestinal: Negative for abdominal distention, abdominal pain, blood in stool, constipation, diarrhea, nausea and vomiting.  Genitourinary: Negative for difficulty urinating, dysuria, flank pain, frequency, hematuria, pelvic pain, urgency, vaginal bleeding, vaginal discharge and vaginal pain.  Musculoskeletal: Negative for arthralgias, gait problem and joint swelling.       Chronic Miranda [name] shoulder pain  Skin: Negative for rash.  Neurological: Negative for dizziness, syncope, speech difficulty, weakness, numbness and headaches.  Hematological: Negative for adenopathy.  Psychiatric/Behavioral: Negative for agitation, behavioral problems and dysphoric mood. The patient is not nervous/anxious.        Objective:   Physical Exam  Constitutional: She appears well-developed and well-nourished. No distress.  Blood pressure well controlled  Musculoskeletal:  Tenderness with palpation over the anterior shoulder area Range of motion causes increasing pain          Assessment & Plan:   Chronic left shoulder pain.  We will set up for orthopedic evaluation Essential hypertension stable Type 2 diabetes   Lab Results  Component Value Date   HGBA1C 5.9 (A) 09/22/2017    Marletta Lor

## 2017-12-01 NOTE — Patient Instructions (Signed)
Orthopedic consultation as discussed  BEST WISHES!!

## 2017-12-02 ENCOUNTER — Ambulatory Visit: Payer: Medicare Other | Admitting: Physical Therapy

## 2017-12-06 ENCOUNTER — Ambulatory Visit: Payer: Medicare Other | Admitting: Physical Therapy

## 2017-12-08 ENCOUNTER — Encounter (INDEPENDENT_AMBULATORY_CARE_PROVIDER_SITE_OTHER): Payer: Self-pay | Admitting: Surgery

## 2017-12-08 ENCOUNTER — Ambulatory Visit (INDEPENDENT_AMBULATORY_CARE_PROVIDER_SITE_OTHER): Payer: Medicare Other | Admitting: Surgery

## 2017-12-08 DIAGNOSIS — M7502 Adhesive capsulitis of left shoulder: Secondary | ICD-10-CM

## 2017-12-08 DIAGNOSIS — M7542 Impingement syndrome of left shoulder: Secondary | ICD-10-CM

## 2017-12-08 MED ORDER — BUPIVACAINE HCL 0.25 % IJ SOLN
4.0000 mL | INTRAMUSCULAR | Status: AC | PRN
  Administered 2017-12-08: 4 mL via INTRA_ARTICULAR

## 2017-12-08 MED ORDER — LIDOCAINE HCL 1 % IJ SOLN
3.0000 mL | INTRAMUSCULAR | Status: AC | PRN
  Administered 2017-12-08: 3 mL

## 2017-12-08 MED ORDER — METHYLPREDNISOLONE ACETATE 40 MG/ML IJ SUSP
40.0000 mg | INTRAMUSCULAR | Status: AC | PRN
  Administered 2017-12-08: 40 mg via INTRA_ARTICULAR

## 2017-12-08 NOTE — Progress Notes (Signed)
Office Visit Note   Patient: Miranda [name]           Date of Birth: [birth year]           MRN: [id number] Visit Date: 12/08/2017              Requested by: Marletta Lor, MD [place], [place] [address] PCP: Marletta Lor, MD   Assessment & Plan: Visit Diagnoses:  1. Impingement syndrome of left shoulder   2. Adhesive capsulitis of left shoulder     Plan: In hopes of giving patient some relief of shoulder pain offered diagnostic/therapeutic subacromial injection.  With Marcaine in place she had minimal improvement of her pain and flexion/abduction to about 90 degrees.  At this point I recommend proceeding with left shoulder MRI with contrast to rule out rotator cuff tear adhesive capsulitis and other shoulder pathology.  Follow-up with Dr. Alphonzo Severance completion to discuss results and further treatment options.  Follow-Up Instructions: Return in about 3 weeks (around 12/29/2017) for With Dr. Marlou Sa to review left shoulder MRI.   Orders:  No orders of the defined types were placed in this encounter.  No orders of the defined types were placed in this encounter.     Procedures: Large Joint Inj: L subacromial bursa on 12/08/2017 12:17 PM Indications: pain Details: 25 G 1.5 in needle, posterior approach Medications: 3 mL lidocaine 1 %; 4 mL bupivacaine 0.25 %; 40 mg methylPREDNISolone acetate 40 MG/ML Outcome: tolerated well, no immediate complications Consent was given by the patient. Patient was prepped and draped in the usual sterile fashion.       Clinical Data: No additional findings.   Subjective: Chief Complaint  Patient presents with  . Left Shoulder - Pain    HPI 72 year old this is been progressively getting worse over the last month or so.  No specific injury that she can recall.  Feels like she is losing range of motion.  Pain with attempted overhead reaching.  No cervical spine or radicular component.  She has tried oral  NSAIDs along with Vicodin prescribed by her primary care physician.  Patient has also gone through formal PT and has done home exercise program without any improvement. Review of Systems No current cardiac pulmonary GI GU issues  Objective: Vital Signs: There were no vitals taken for this visit.  Physical Exam  Constitutional: She is oriented to person, place, and time.  Pulmonary/Chest: No respiratory distress.  Musculoskeletal:  Stiffness throughout the left shoulder.  Passive flexion/abduction to about 45 degrees with marked discomfort.  Tender along proximal Biceps tendon.  Markedly positive impingement test.   Neurological: She is alert and oriented to person, place, and time.  Skin: Skin is warm and dry.    Ortho Exam  Specialty Comments:  No specialty comments available.  Imaging: No results found.   PMFS History: Patient Active Problem List   Diagnosis Date Noted  . Atrial fibrillation with RVR ([place]) 11/03/2017  . LVH (left ventricular hypertrophy) 09/22/2017  . Diabetes mellitus without complication ([place]) 58/85/0277  . Degeneration of lumbar intervertebral disc 07/06/2017  . Pulmonary emboli ([place]) 05/02/2016  . Right leg pain 05/01/2016  . ILD (interstitial lung disease) ([place]) 05/01/2016  . Bradycardia 05/01/2016  . Coronary artery calcification seen on CAT scan 08/06/2015  . Open angle with borderline findings, low risk, bilateral 04/29/2015  . UIP (usual interstitial pneumonitis) ([place]) 04/05/2015  . Bilateral posterior capsular opacification 09/21/2014  . Pseudophakia of both  eyes 07/27/2014  . Combined form of senile cataract of both eyes 05/30/2014  . Osteoarthritis 09/17/2011  . Narrowing of intervertebral disc space 09/17/2011  . Pyelonephritis with possible nonobstructing 5 mm calculus by renal ultrasound 09/16/2011  . Hypokalemia 09/16/2011  . External hemorrhoids 09/13/2011  . Obstructive sleep apnea syndrome 06/10/2011  . Anal fissure 02/17/2011  .  History of colonic polyps 04/03/2010  . Respiratory condition due to chemical fumes and vapors ([place]) 01/20/2010  . Urinary incontinence 04/30/2009  . Insomnia 09/28/2007  . Asthma 11/22/2006  . Dyslipidemia 08/19/2006  . HTN (hypertension) 08/19/2006  . GERD 08/19/2006  . Low back pain 08/19/2006   Past Medical History:  Diagnosis Date  . Anxiety   . Arthritis   . Asthma   . Atrial fibrillation ([place])   . CHF (congestive heart failure) ([place])    pt. unsure- but thinks she was hosp. for CHF- 2002  . [facility] kidney disease    [facility]  . Clotting disorder ([place])    blood clots in lungs/PE pulmonary embolism  . Colon polyps   . Complication of anesthesia    states requires a lot med. to put her to sleep   . DDD (degenerative disc disease) 09/17/2011  . Depression    "sometimes "  . DM (diabetes mellitus) ([place])   . Family history of anesthesia complication   . GERD (gastroesophageal reflux disease)   . Glaucoma    bilateral, pt. admits that she is noncompliant to eye gtts.   . Hemorrhoids   . Hyperlipidemia   . Hypertension    had stress, echo- 2006 /w [place], Cardiac Cath, per pt. 2002, echo repeated 2012- wnl   . Low back pain   . Shortness of breath   . Sleep apnea    uses c-pap- q night recently    Family History  Problem Relation Age of Onset  . Heart attack Mother   . Heart disease Mother   . Breast cancer Mother   . Emphysema Sister   . Breast cancer Sister   . Arthritis/Rheumatoid Sister   . Asthma Sister   . Lung cancer Sister   . COPD Sister   . Colon cancer Brother   . Colon cancer Brother   . Anesthesia problems Neg Hx     Past Surgical History:  Procedure Laterality Date  . ABDOMINAL HYSTERECTOMY     ectopic, fibroids  . arm surgery Right   . CARDIAC CATHETERIZATION    . CATARACT EXTRACTION, BILATERAL     cataracts removed bilateral- ?IOL  . COLONOSCOPY     remote  . FISSURECTOMY  10/08/2011   Procedure: FISSURECTOMY;  Surgeon:  Stark Klein, MD;  Location: [facility];  Service: General;  Laterality: N/A;  . FLEXIBLE SIGMOIDOSCOPY  02/25/2011   Procedure: FLEXIBLE SIGMOIDOSCOPY;  Surgeon: Inda Castle, MD;  [facility];  Service: Endoscopy;  Laterality: N/A;  . FOOT SURGERY     bilat, heel spurs- screw in R foot   . HEMORRHOID SURGERY  10/08/2011   Procedure: HEMORRHOIDECTOMY;  Surgeon: Stark Klein, MD;  Location: [facility];  Service: General;  Laterality: N/A;  External   . SPHINCTEROTOMY  10/08/2011   Procedure: Joan Mayans;  Surgeon: Stark Klein, MD;  Location: [facility];  Service: General;  Laterality: N/A;   Social History   Occupational History  . Occupation: retired Forensic psychologist: UNEMPLOYED  Tobacco Use  . Smoking status: Never Smoker  . Smokeless tobacco: Never Used  Substance and Sexual Activity  . Alcohol use: No    Alcohol/week: 0.0 standard drinks  . Drug use: No  . Sexual activity: Never

## 2017-12-09 ENCOUNTER — Encounter: Payer: Self-pay | Admitting: Physical Therapy

## 2017-12-09 ENCOUNTER — Ambulatory Visit: Payer: Medicare Other | Admitting: Physical Therapy

## 2017-12-09 DIAGNOSIS — M6281 Muscle weakness (generalized): Secondary | ICD-10-CM

## 2017-12-09 DIAGNOSIS — M25512 Pain in left shoulder: Principal | ICD-10-CM

## 2017-12-09 DIAGNOSIS — R600 Localized edema: Secondary | ICD-10-CM

## 2017-12-09 DIAGNOSIS — G8929 Other chronic pain: Secondary | ICD-10-CM

## 2017-12-09 DIAGNOSIS — M25612 Stiffness of left shoulder, not elsewhere classified: Secondary | ICD-10-CM

## 2017-12-09 NOTE — Patient Instructions (Signed)
Remove tape if irritating 

## 2017-12-09 NOTE — Therapy (Signed)
[place], [place], [address] Phone: [phone]   Fax:  [phone]  Physical Therapy Treatment  Patient Details  Name: Miranda [name] MRN: [id number] Date of Birth: 1945-12-18 Referring Provider (PT): Marzetta Board MD   Encounter Date: 12/09/2017  PT End of Session - 12/09/17 1801    Visit Number  3    Number of Visits  13    Date for PT Re-Evaluation  12/23/17    PT Start Time  1287   short session patient late and had ice   PT Stop Time  8676    PT Time Calculation (min)  42 min    Activity Tolerance  Patient tolerated treatment well;Patient limited by pain    Behavior During Therapy  [facility] for tasks assessed/performed       Past Medical History:  Diagnosis Date  . Anxiety   . Arthritis   . Asthma   . Atrial fibrillation ([place])   . CHF (congestive heart failure) ([place])    pt. unsure- but thinks she was hosp. for CHF- 2002  . [facility] disease    [facility]  . Clotting disorder ([place])    blood clots in lungs/PE pulmonary embolism  . Colon polyps   . Complication of anesthesia    states requires a lot med. to put her to sleep   . DDD (degenerative disc disease) 09/17/2011  . Depression    "sometimes "  . DM (diabetes mellitus) ([place])   . Family history of anesthesia complication   . GERD (gastroesophageal reflux disease)   . Glaucoma    bilateral, pt. admits that she is noncompliant to eye gtts.   . Hemorrhoids   . Hyperlipidemia   . Hypertension    had stress, echo- 2006 /w [place], Cardiac Cath, per pt. 2002, echo repeated 2012- wnl   . Low back pain   . Shortness of breath   . Sleep apnea    uses c-pap- q night recently    Past Surgical History:  Procedure Laterality Date  . ABDOMINAL HYSTERECTOMY     ectopic, fibroids  . arm surgery Right   . CARDIAC CATHETERIZATION    . CATARACT EXTRACTION, BILATERAL     cataracts removed bilateral- ?IOL  . COLONOSCOPY     remote  . FISSURECTOMY  10/08/2011   Procedure: FISSURECTOMY;  Surgeon: Stark Klein, MD;  Location: [facility];  Service: General;  Laterality: N/A;  . FLEXIBLE SIGMOIDOSCOPY  02/25/2011   Procedure: FLEXIBLE SIGMOIDOSCOPY;  Surgeon: Inda Castle, MD;  [facility];  Service: Endoscopy;  Laterality: N/A;  . FOOT SURGERY     bilat, heel spurs- screw in R foot   . HEMORRHOID SURGERY  10/08/2011   Procedure: HEMORRHOIDECTOMY;  Surgeon: Stark Klein, MD;  Location: [facility];  Service: General;  Laterality: N/A;  External   . SPHINCTEROTOMY  10/08/2011   Procedure: Joan Mayans;  Surgeon: Stark Klein, MD;  Location: [facility];  Service: General;  Laterality: N/A;    There were no vitals filed for this visit.  Subjective Assessment - 12/09/17 1520    Subjective  had a shot yesterday posterior shoulder.    Pain Descriptors / Indicators  Sore;Constant;Aching;Throbbing;Sharp;Burning;Heaviness   hurt   Pain Radiating Towards  to mid biceps,  to neck    Pain Frequency  Constant    Aggravating Factors   extension,, flexion ,  sleeping on     Pain Relieving Factors  ice  Effect of Pain on Daily Activities  pain keep her up,   Limited reaching.  extra time to get dressed.     Multiple Pain Sites  --   groin to knees anterior                      [facility] Adult PT Treatment/Exercise - 12/09/17 0001      Self-Care   Self-Care  Posture    Posture  sitting with pillow under arm up high,  feet on stool      Shoulder Exercises: Seated   Retraction Limitations  3 X stopped due to pain    Other Seated Exercises  demo table slides  patient feels she is not ready yet.       Shoulder Exercises: Standing   Other Standing Exercises  elbow extension 2 x  limited ROM      Shoulder Exercises: Isometric Strengthening   Extension  5X5"    Extension Limitations  cued for no pain    External Rotation  3X5"    External Rotation Limitations  cued no pain    Internal Rotation Limitations   3 X 5 cued no pain    ADduction  5X5"    ADduction Limitations  cued no pain  squeezes pillow      Cryotherapy   Number Minutes Cryotherapy  15 Minutes    Cryotherapy Location  Shoulder    Type of Cryotherapy  --   cold pack     Manual Therapy   Manual Therapy  Taping    Manual therapy comments  did not tolerate ROM,  soft tissue,    Kinesiotex  Inhibit Muscle      Kinesiotix   Inhibit Muscle   bicep             PT Education - 12/09/17 1800    Education Details  Posture    Person(s) Educated  Patient    Methods  Explanation;Demonstration;Verbal cues    Comprehension  Verbalized understanding;Returned demonstration       PT Short Term Goals - 12/09/17 1805      PT SHORT TERM GOAL #1   Title  pt to be I with inital HEP    Baseline  needs cues,  non compliant with some    Time  3    Period  Weeks    Status  On-going      PT SHORT TERM GOAL #2   Title  pt to verbalize/ demo techniques for proper posture to prevent L shoulder pain     Baseline  education today    Time  3    Period  Weeks    Status  On-going      PT SHORT TERM GOAL #3   Title  increase L shoulder AAROM flexion/ abduction to >/= 90 degrees with </= 6/10 pain for functional and therapuetic progression    Baseline  limited patient guarding    Time  3    Period  Weeks    Status  On-going        PT Long Term Goals - 11/11/17 1714      PT LONG TERM GOAL #1   Title  increase L shoulder flexion/ abduction to >/= 120 and IR/ER to >/= 50 degrees with </= 5/10 pain for functional ROm for ADLs    Time  6    Period  Weeks    Status  New    Target Date  12/23/17  PT LONG TERM GOAL #2   Title  pt to increase L shoulder strength to >/= 4/5 in all planes to promote shoulder stability with household lifting activities     Time  6    Period  Weeks    Status  New    Target Date  12/23/17      PT LONG TERM GOAL #3   Title  pt to be able to lift and carry >/= 8# for functional lifting activitys  and for pt's goal of return to bowling with </= 4/10 pain     Time  6    Period  Weeks    Status  New    Target Date  12/23/17      PT LONG TERM GOAL #4   Title  pt to be I with intial HEp given as of last visit    Time  6    Period  Weeks    Status  New    Target Date  12/23/17            Plan - 12/09/17 1802    Clinical Impression Statement  Short session due to patient arriving late. She has missed a few sessions due to being in  She did not tolerate a lot today.  She holds arm across her body.  No new exercises added since the were painful.  Posture improved with pillow and foot stoolain.     PT Next Visit Plan   shoulder PROM/ AAROM, Soft tissue work, upper trap stretching, scapular setting, modalities PRN assess tapeConsider UE Ranger    PT Home Exercise Plan  table slides flexion/ abduction, upper trap stretching, scapular retraction, internal rotation/ external rotation, rows, and wall walks flexion    Consulted and Agree with Plan of Care  Patient       Patient will benefit from skilled therapeutic intervention in order to improve the following deficits and impairments:     Visit Diagnosis: Chronic left shoulder pain  Stiffness of left shoulder, not elsewhere classified  Muscle weakness (generalized)  Localized edema     Problem List Patient Active Problem List   Diagnosis Date Noted  . Atrial fibrillation with RVR ([place]) 11/03/2017  . LVH (left ventricular hypertrophy) 09/22/2017  . Diabetes mellitus without complication ([place]) 13/24/4010  . Degeneration of lumbar intervertebral disc 07/06/2017  . Pulmonary emboli ([place]) 05/02/2016  . Right leg pain 05/01/2016  . ILD (interstitial lung disease) ([place]) 05/01/2016  . Bradycardia 05/01/2016  . Coronary artery calcification seen on CAT scan 08/06/2015  . Open angle with borderline findings, low risk, bilateral 04/29/2015  . UIP (usual interstitial pneumonitis) ([place]) 04/05/2015  . Bilateral posterior capsular  opacification 09/21/2014  . Pseudophakia of both eyes 07/27/2014  . Combined form of senile cataract of both eyes 05/30/2014  . Osteoarthritis 09/17/2011  . Narrowing of intervertebral disc space 09/17/2011  . Pyelonephritis with possible nonobstructing 5 mm calculus by renal ultrasound 09/16/2011  . Hypokalemia 09/16/2011  . External hemorrhoids 09/13/2011  . Obstructive sleep apnea syndrome 06/10/2011  . Anal fissure 02/17/2011  . History of colonic polyps 04/03/2010  . Respiratory condition due to chemical fumes and vapors ([place]) 01/20/2010  . Urinary incontinence 04/30/2009  . Insomnia 09/28/2007  . Asthma 11/22/2006  . Dyslipidemia 08/19/2006  . HTN (hypertension) 08/19/2006  . GERD 08/19/2006  . Low back pain 08/19/2006    HARRIS,KAREN PTA 12/09/2017, 6:09 PM  [facility] [address] [place], [place], [address]  Phone: [phone]   Fax:  [phone]  Name: JURNIE GARRITANO MRN: [id number] Date of Birth: 1945/07/10

## 2017-12-13 ENCOUNTER — Other Ambulatory Visit: Payer: Self-pay | Admitting: Internal Medicine

## 2017-12-13 ENCOUNTER — Encounter: Payer: Self-pay | Admitting: Physical Therapy

## 2017-12-13 ENCOUNTER — Ambulatory Visit: Payer: Medicare Other | Admitting: Physical Therapy

## 2017-12-13 DIAGNOSIS — G8929 Other chronic pain: Secondary | ICD-10-CM

## 2017-12-13 DIAGNOSIS — M25612 Stiffness of left shoulder, not elsewhere classified: Secondary | ICD-10-CM

## 2017-12-13 DIAGNOSIS — M6281 Muscle weakness (generalized): Secondary | ICD-10-CM

## 2017-12-13 DIAGNOSIS — R600 Localized edema: Secondary | ICD-10-CM

## 2017-12-13 DIAGNOSIS — M25512 Pain in left shoulder: Secondary | ICD-10-CM | POA: Diagnosis not present

## 2017-12-13 NOTE — Therapy (Signed)
[place], [place], [address] Phone: [phone]   Fax:  [phone]  Physical Therapy Treatment  Patient Details  Name: Miranda [name] MRN: [id number] Date of Birth: 12-17-1945 Referring Provider (PT): Marzetta Board MD   Encounter Date: 12/13/2017  PT End of Session - 12/13/17 1604    Visit Number  4    Number of Visits  13    Date for PT Re-Evaluation  12/23/17    PT Start Time  1302    PT Stop Time  1345    PT Time Calculation (min)  43 min    Activity Tolerance  Patient tolerated treatment well;Patient limited by pain    Behavior During Therapy  [facility] for tasks assessed/performed       Past Medical History:  Diagnosis Date  . Anxiety   . Arthritis   . Asthma   . Atrial fibrillation ([place])   . CHF (congestive heart failure) ([place])    pt. unsure- but thinks she was hosp. for CHF- 2002  . Chronic kidney disease    [facility]  . Clotting disorder ([place])    blood clots in lungs/PE pulmonary embolism  . Colon polyps   . Complication of anesthesia    states requires a lot med. to put her to sleep   . DDD (degenerative disc disease) 09/17/2011  . Depression    "sometimes "  . DM (diabetes mellitus) ([place])   . Family history of anesthesia complication   . GERD (gastroesophageal reflux disease)   . Glaucoma    bilateral, pt. admits that she is noncompliant to eye gtts.   . Hemorrhoids   . Hyperlipidemia   . Hypertension    had stress, echo- 2006 /w [place], Cardiac Cath, per pt. 2002, echo repeated 2012- wnl   . Low back pain   . Shortness of breath   . Sleep apnea    uses c-pap- q night recently    Past Surgical History:  Procedure Laterality Date  . ABDOMINAL HYSTERECTOMY     ectopic, fibroids  . arm surgery Right   . CARDIAC CATHETERIZATION    . CATARACT EXTRACTION, BILATERAL     cataracts removed bilateral- ?IOL  . COLONOSCOPY     remote  . FISSURECTOMY  10/08/2011   Procedure: FISSURECTOMY;  Surgeon: Stark Klein, MD;  Location: [facility];  Service: General;  Laterality: N/A;  . FLEXIBLE SIGMOIDOSCOPY  02/25/2011   Procedure: FLEXIBLE SIGMOIDOSCOPY;  Surgeon: Inda Castle, MD;  [facility];  Service: Endoscopy;  Laterality: N/A;  . FOOT SURGERY     bilat, heel spurs- screw in R foot   . HEMORRHOID SURGERY  10/08/2011   Procedure: HEMORRHOIDECTOMY;  Surgeon: Stark Klein, MD;  Location: [facility];  Service: General;  Laterality: N/A;  External   . SPHINCTEROTOMY  10/08/2011   Procedure: Joan Mayans;  Surgeon: Stark Klein, MD;  Location: [facility];  Service: General;  Laterality: N/A;    There were no vitals filed for this visit.  Subjective Assessment - 12/13/17 1508    Subjective  Pain is now a ach during the day.  i have been working on the posture.  It does not feel as heavy.  I have been doing the exercises.     Currently in Pain?  No/denies    Pain Score  --   7/10 at night   Pain Location  Shoulder    Pain Orientation  Left    Pain  Descriptors / Indicators  Aching;Sharp;Spasm;Throbbing    Pain Radiating Towards  to neck and soreness throbbing in biceps    Aggravating Factors   reaching up,  extension.,  pain at night.  cannot sleep on    Pain Relieving Factors  ice.  tape,  pillows    Effect of Pain on Daily Activities  limited use of arm, sleep difficult    extra time to get dressed and do chores.      Multiple Pain Sites  No                       [facility] Adult PT Treatment/Exercise - 12/13/17 0001      Self-Care   Posture  sleeping pillows under knees  elbow support.       Shoulder Exercises: Supine   Flexion  AAROM    Flexion Limitations  40 degrees,  hands laced      Shoulder Exercises: Seated   Retraction  10 reps    Retraction Limitations  mild pain,  did not report until stopped    1 pop after this   Other Seated Exercises  UE Rangerflexion , IR ER to neutral  circles  clock wise more comfortable.  10 x  except counter clockwise X 5 due to pain      Manual Therapy   Manual therapy comments  attempted, did not tolerate neck, upper trap  biceps ,    did tolerate elbow ROM   Kinesiotex  Edema;Inhibit Muscle      Kinesiotix   Edema  edema anterior bicep    Inhibit Muscle   bicep,  upper trap       Neck Exercises: Stretches   Upper Trapezius Stretch  3 reps;30 seconds;Left             PT Education - 12/13/17 1603    Education Details  exercise form,  try to keep shoulder from getting stiff.     Person(s) Educated  Patient    Methods  Explanation;Demonstration    Comprehension  Verbalized understanding;Returned demonstration;Need further instruction       PT Short Term Goals - 12/13/17 1608      PT SHORT TERM GOAL #1   Title  pt to be I with inital HEP    Baseline  needs cues,  non compliant with some    Time  3    Period  Weeks    Status  On-going      PT SHORT TERM GOAL #2   Title  pt to verbalize/ demo techniques for proper posture to prevent L shoulder pain     Baseline  education today for posture in bed    Time  3    Period  Weeks    Status  On-going      PT SHORT TERM GOAL #3   Title  increase L shoulder AAROM flexion/ abduction to >/= 90 degrees with </= 6/10 pain for functional and therapuetic progression    Baseline  40 degrees AA flexion in supine with 7/10 pain    Time  3    Period  Weeks        PT Long Term Goals - 11/11/17 1714      PT LONG TERM GOAL #1   Title  increase L shoulder flexion/ abduction to >/= 120 and IR/ER to >/= 50 degrees with </= 5/10 pain for functional ROm for ADLs    Time  6  Period  Weeks    Status  New    Target Date  12/23/17      PT LONG TERM GOAL #2   Title  pt to increase L shoulder strength to >/= 4/5 in all planes to promote shoulder stability with household lifting activities     Time  6    Period  Weeks    Status  New    Target Date  12/23/17      PT LONG TERM GOAL #3   Title  pt to be able to lift and  carry >/= 8# for functional lifting activitys and for pt's goal of return to bowling with </= 4/10 pain     Time  6    Period  Weeks    Status  New    Target Date  12/23/17      PT LONG TERM GOAL #4   Title  pt to be I with intial HEp given as of last visit    Time  6    Period  Weeks    Status  New    Target Date  12/23/17            Plan - 12/13/17 1606    Clinical Impression Statement  40 degrees AA flexion in supine.  She has been doing the best she can at home. Pain 7/10 at end of session.  Tape is helpful.  She does not tolerate much manual, addressing edema with tape.     PT Next Visit Plan   shoulder PROM/ AAROM, Soft tissue work, upper trap stretching, scapular setting, modalities PRN assess tapeContinue UE Ranger    PT Home Exercise Plan  table slides flexion/ abduction, upper trap stretching, scapular retraction, internal rotation/ external rotation, rows, and wall walks flexion    Consulted and Agree with Plan of Care  Patient       Patient will benefit from skilled therapeutic intervention in order to improve the following deficits and impairments:     Visit Diagnosis: Chronic left shoulder pain  Stiffness of left shoulder, not elsewhere classified  Muscle weakness (generalized)  Localized edema     Problem List Patient Active Problem List   Diagnosis Date Noted  . Atrial fibrillation with RVR ([place]) 11/03/2017  . LVH (left ventricular hypertrophy) 09/22/2017  . Diabetes mellitus without complication ([place]) 85/63/1497  . Degeneration of lumbar intervertebral disc 07/06/2017  . Pulmonary emboli ([place]) 05/02/2016  . Right leg pain 05/01/2016  . ILD (interstitial lung disease) ([place]) 05/01/2016  . Bradycardia 05/01/2016  . Coronary artery calcification seen on CAT scan 08/06/2015  . Open angle with borderline findings, low risk, bilateral 04/29/2015  . UIP (usual interstitial pneumonitis) ([place]) 04/05/2015  . Bilateral posterior capsular opacification  09/21/2014  . Pseudophakia of both eyes 07/27/2014  . Combined form of senile cataract of both eyes 05/30/2014  . Osteoarthritis 09/17/2011  . Narrowing of intervertebral disc space 09/17/2011  . Pyelonephritis with possible nonobstructing 5 mm calculus by renal ultrasound 09/16/2011  . Hypokalemia 09/16/2011  . External hemorrhoids 09/13/2011  . Obstructive sleep apnea syndrome 06/10/2011  . Anal fissure 02/17/2011  . History of colonic polyps 04/03/2010  . Respiratory condition due to chemical fumes and vapors ([place]) 01/20/2010  . Urinary incontinence 04/30/2009  . Insomnia 09/28/2007  . Asthma 11/22/2006  . Dyslipidemia 08/19/2006  . HTN (hypertension) 08/19/2006  . GERD 08/19/2006  . Low back pain 08/19/2006    Nayab Aten  PTA 12/13/2017, 4:11 PM  [place]  [facility] [address] [place], [place], [address] Phone: [phone]   Fax:  [phone]  Name: SUNDY HOUCHINS MRN: [id number] Date of Birth: 03-30-1945

## 2017-12-16 ENCOUNTER — Telehealth: Payer: Self-pay | Admitting: Physical Therapy

## 2017-12-16 ENCOUNTER — Ambulatory Visit: Payer: Medicare Other | Attending: Internal Medicine | Admitting: Physical Therapy

## 2017-12-16 ENCOUNTER — Other Ambulatory Visit: Payer: Self-pay

## 2017-12-16 ENCOUNTER — Ambulatory Visit: Payer: Medicare Other | Admitting: Physical Therapy

## 2017-12-16 DIAGNOSIS — R600 Localized edema: Secondary | ICD-10-CM | POA: Diagnosis present

## 2017-12-16 DIAGNOSIS — M25512 Pain in left shoulder: Secondary | ICD-10-CM | POA: Diagnosis present

## 2017-12-16 DIAGNOSIS — M6281 Muscle weakness (generalized): Secondary | ICD-10-CM | POA: Diagnosis present

## 2017-12-16 DIAGNOSIS — M25612 Stiffness of left shoulder, not elsewhere classified: Secondary | ICD-10-CM

## 2017-12-16 DIAGNOSIS — G8929 Other chronic pain: Secondary | ICD-10-CM

## 2017-12-16 NOTE — Telephone Encounter (Signed)
Pt was currently sitting in waiting room, and was able to get trasnferred to another appointment.

## 2017-12-16 NOTE — Therapy (Signed)
[place], [place], [address] Phone: [phone]   Fax:  [phone]  Physical Therapy Treatment  Patient Details  Name: Miranda [name] MRN: [id number] Date of Birth: 07-31-1945 Referring Provider (PT): Marzetta Board MD   Encounter Date: 12/16/2017  PT End of Session - 12/16/17 1638    Visit Number  5    Number of Visits  13    Date for PT Re-Evaluation  12/23/17    Authorization Type  MCR (Kx mod by 15th visit and progress note by 10th)    PT Start Time  1544    PT Stop Time  1633    PT Time Calculation (min)  49 min    Activity Tolerance  Patient limited by pain    Behavior During Therapy  [facility] for tasks assessed/performed       Past Medical History:  Diagnosis Date  . Anxiety   . Arthritis   . Asthma   . Atrial fibrillation ([place])   . CHF (congestive heart failure) ([place])    pt. unsure- but thinks she was hosp. for CHF- 2002  . [facility] disease    [facility]  . Clotting disorder ([place])    blood clots in lungs/PE pulmonary embolism  . Colon polyps   . Complication of anesthesia    states requires a lot med. to put her to sleep   . DDD (degenerative disc disease) 09/17/2011  . Depression    "sometimes "  . DM (diabetes mellitus) ([place])   . Family history of anesthesia complication   . GERD (gastroesophageal reflux disease)   . Glaucoma    bilateral, pt. admits that she is noncompliant to eye gtts.   . Hemorrhoids   . Hyperlipidemia   . Hypertension    had stress, echo- 2006 /w [place], Cardiac Cath, per pt. 2002, echo repeated 2012- wnl   . Low back pain   . Shortness of breath   . Sleep apnea    uses c-pap- q night recently    Past Surgical History:  Procedure Laterality Date  . ABDOMINAL HYSTERECTOMY     ectopic, fibroids  . arm surgery Right   . CARDIAC CATHETERIZATION    . CATARACT EXTRACTION, BILATERAL     cataracts removed bilateral- ?IOL  . COLONOSCOPY      remote  . FISSURECTOMY  10/08/2011   Procedure: FISSURECTOMY;  Surgeon: Stark Klein, MD;  Location: [place];  Service: General;  Laterality: N/A;  . FLEXIBLE SIGMOIDOSCOPY  02/25/2011   Procedure: FLEXIBLE SIGMOIDOSCOPY;  Surgeon: Inda Castle, MD;  [facility];  Service: Endoscopy;  Laterality: N/A;  . FOOT SURGERY     bilat, heel spurs- screw in R foot   . HEMORRHOID SURGERY  10/08/2011   Procedure: HEMORRHOIDECTOMY;  Surgeon: Stark Klein, MD;  Location: [place];  Service: General;  Laterality: N/A;  External   . SPHINCTEROTOMY  10/08/2011   Procedure: Joan Mayans;  Surgeon: Stark Klein, MD;  Location: [place];  Service: General;  Laterality: N/A;    There were no vitals filed for this visit.  Subjective Assessment - 12/16/17 1630    Subjective  Pt. reports shoulder has been feeling better than last visit but still having pain including left bicep as well as general diffuse anterior + posterior shoulder abnd periscapular pain.    Pertinent History  hx of Afib and pulmonary embolism    Currently in Pain?  Yes    Pain  Score  5     Pain Location  Shoulder    Pain Orientation  Left    Pain Descriptors / Indicators  Aching    Pain Type  Chronic pain    Pain Radiating Towards  bicep into shoulder and upper trapezius region    Pain Onset  More than a month ago    Pain Frequency  Constant    Aggravating Factors   reaching, activity    Pain Relieving Factors  ice, rest    Effect of Pain on Daily Activities  difficulty with reaching ADLs, chores, sleep disturbance                       [facility] Adult PT Treatment/Exercise - 12/16/17 0001      Exercises   Exercises  Shoulder      Shoulder Exercises: Supine   Flexion  AAROM   AAROM with wand short arc press with therapist assist 15 rep   Other Supine Exercises  ER AAROM with wand x 15    Other Supine Exercises  Left shoulder PROM   Limited by pain and tendency muscle guarding     Shoulder Exercises:  Seated   Retraction  AROM;15 reps   min-mod cues for form   Retraction Limitations  limited by soreness    Other Seated Exercises  UE ranger ER/IR 2x10    Other Seated Exercises  bicep curl AROM x 20      Shoulder Exercises: Sidelying   Other Sidelying Exercises  sidelying UE ranger flexion/extension "circles" x 15 ea. way      Shoulder Exercises: Standing   Other Standing Exercises  P-ball roll flexion AAROM on high table x 15    Other Standing Exercises  Pendulums x 30 ea. way CW/CCW      Cryotherapy   Number Minutes Cryotherapy  10 Minutes    Cryotherapy Location  Shoulder    Type of Cryotherapy  --   cold gel pack     Manual Therapy   Manual Therapy  Soft tissue mobilization    Soft tissue mobilization  Left upper trapezius, bicep, and posterior scapular region   gentle pressure, difficulty tolerating to bicep so limited            PT Education - 12/16/17 1636    Education Details  exercise form, mod cues to avoid shoulder shrug, retract scapula, cues to avoid guarding with PROM    Person(s) Educated  Patient    Methods  Explanation;Demonstration;Tactile cues    Comprehension  Verbalized understanding;Need further instruction;Returned demonstration;Verbal cues required;Tactile cues required       PT Short Term Goals - 12/13/17 1608      PT SHORT TERM GOAL #1   Title  pt to be I with inital HEP    Baseline  needs cues,  non compliant with some    Time  3    Period  Weeks    Status  On-going      PT SHORT TERM GOAL #2   Title  pt to verbalize/ demo techniques for proper posture to prevent L shoulder pain     Baseline  education today for posture in bed    Time  3    Period  Weeks    Status  On-going      PT SHORT TERM GOAL #3   Title  increase L shoulder AAROM flexion/ abduction to >/= 90 degrees with </= 6/10 pain for functional and  therapuetic progression    Baseline  40 degrees AA flexion in supine with 7/10 pain    Time  3    Period  Weeks         PT Long Term Goals - 11/11/17 1714      PT LONG TERM GOAL #1   Title  increase L shoulder flexion/ abduction to >/= 120 and IR/ER to >/= 50 degrees with </= 5/10 pain for functional ROm for ADLs    Time  6    Period  Weeks    Status  New    Target Date  12/23/17      PT LONG TERM GOAL #2   Title  pt to increase L shoulder strength to >/= 4/5 in all planes to promote shoulder stability with household lifting activities     Time  6    Period  Weeks    Status  New    Target Date  12/23/17      PT LONG TERM GOAL #3   Title  pt to be able to lift and carry >/= 8# for functional lifting activitys and for pt's goal of return to bowling with </= 4/10 pain     Time  6    Period  Weeks    Status  New    Target Date  12/23/17      PT LONG TERM GOAL #4   Title  pt to be I with intial HEp given as of last visit    Time  6    Period  Weeks    Status  New    Target Date  12/23/17            Plan - 12/16/17 1638    Clinical Impression Statement  Fair tx. tolerance due to pain and tendency muscle guarding. Though potential myofascial component to symptoms concern underlying structural issues so if symptoms persist plan is for patient to contact MD office for MRI before next follow up.    Rehab Potential  Fair    PT Frequency  2x / week    PT Duration  6 weeks    PT Treatment/Interventions  ADLs/Self Care Home Management;Cryotherapy;Electrical Stimulation;Iontophoresis 4mg /ml Dexamethasone;Moist Heat;Ultrasound;Taping;Vasopneumatic Device;Manual techniques;Therapeutic exercise;Therapeutic activities;Passive range of motion;Dry needling;Gait training;Patient/family education    PT Next Visit Plan   shoulder PROM/ AAROM, Soft tissue work, upper trap stretching, scapular setting, modalities PRN assess tapeContinue UE Ranger    PT Home Exercise Plan  table slides flexion/ abduction, upper trap stretching, scapular retraction, internal rotation/ external rotation, rows, and wall walks  flexion    Consulted and Agree with Plan of Care  Patient       Patient will benefit from skilled therapeutic intervention in order to improve the following deficits and impairments:  Decreased activity tolerance, Decreased endurance, Decreased range of motion, Pain, Postural dysfunction, Impaired flexibility, Increased muscle spasms, Increased fascial restricitons, Decreased strength, Increased edema  Visit Diagnosis: Chronic left shoulder pain  Stiffness of left shoulder, not elsewhere classified  Muscle weakness (generalized)  Localized edema     Problem List Patient Active Problem List   Diagnosis Date Noted  . Atrial fibrillation with RVR ([place]) 11/03/2017  . LVH (left ventricular hypertrophy) 09/22/2017  . Diabetes mellitus without complication ([place]) 96/75/9163  . Degeneration of lumbar intervertebral disc 07/06/2017  . Pulmonary emboli ([place]) 05/02/2016  . Right leg pain 05/01/2016  . ILD (interstitial lung disease) ([place]) 05/01/2016  . Bradycardia 05/01/2016  . Coronary artery calcification seen  on CAT scan 08/06/2015  . Open angle with borderline findings, low risk, bilateral 04/29/2015  . UIP (usual interstitial pneumonitis) ([place]) 04/05/2015  . Bilateral posterior capsular opacification 09/21/2014  . Pseudophakia of both eyes 07/27/2014  . Combined form of senile cataract of both eyes 05/30/2014  . Osteoarthritis 09/17/2011  . Narrowing of intervertebral disc space 09/17/2011  . Pyelonephritis with possible nonobstructing 5 mm calculus by renal ultrasound 09/16/2011  . Hypokalemia 09/16/2011  . External hemorrhoids 09/13/2011  . Obstructive sleep apnea syndrome 06/10/2011  . Anal fissure 02/17/2011  . History of colonic polyps 04/03/2010  . Respiratory condition due to chemical fumes and vapors ([place]) 01/20/2010  . Urinary incontinence 04/30/2009  . Insomnia 09/28/2007  . Asthma 11/22/2006  . Dyslipidemia 08/19/2006  . HTN (hypertension) 08/19/2006  . GERD  08/19/2006  . Low back pain 08/19/2006    Judeth Cornfield Talitha Dicarlo, PT, DPT 12/16/2017, 4:42 PM  [facility] [address] [place], [place], [address] Phone: [phone]   Fax:  [phone]  Name: JERRA HUCKEBY MRN: [id number] Date of Birth: 07-02-1945

## 2017-12-23 ENCOUNTER — Other Ambulatory Visit: Payer: Self-pay | Admitting: *Deleted

## 2017-12-23 MED ORDER — ATORVASTATIN CALCIUM 80 MG PO TABS: 80.0000 mg | ORAL_TABLET | Freq: Every day | ORAL | 3 refills | Status: DC

## 2017-12-24 ENCOUNTER — Encounter

## 2017-12-29 ENCOUNTER — Ambulatory Visit (INDEPENDENT_AMBULATORY_CARE_PROVIDER_SITE_OTHER): Payer: Medicare Other | Admitting: Orthopedic Surgery

## 2017-12-29 ENCOUNTER — Encounter (INDEPENDENT_AMBULATORY_CARE_PROVIDER_SITE_OTHER): Payer: Self-pay | Admitting: Orthopedic Surgery

## 2017-12-29 DIAGNOSIS — M7542 Impingement syndrome of left shoulder: Secondary | ICD-10-CM

## 2017-12-29 NOTE — Progress Notes (Signed)
Office Visit Note   Patient: Miranda [name]           Date of Birth: 1945/05/25           MRN: [id number] Visit Date: 12/29/2017 Requested by: Marletta Lor, MD [place], [place] [address] PCP: Marletta Lor, MD  Subjective: Chief Complaint  Patient presents with  . Left Shoulder - Follow-up    HPI: Patient presents for follow-up of left shoulder pain.  She was diagnosed with possible early adhesive capsulitis and impingement syndrome last clinic visit by Jeneen Rinks the [facility].  Injection performed at that time and that has been exceedingly helpful for her.  She reports a little bit of pain in the biceps region but overall she is much improved.  She has not had an MRI scan.  She is going to physical therapy and has 1 more appointment              ROS: All systems reviewed are negative as they relate to the chief complaint within the history of present illness.  Patient denies  fevers or chills.   Assessment & Plan: Visit Diagnoses:  1. Impingement syndrome of left shoulder     Plan: Impression is left shoulder pain with improvement in symptoms following subacromial injection.  Plan is to cancel the MRI scan and to continue with therapy progressing to home exercise program.  Currently the patient has essentially full active and passive range of motion with only mild residual biceps tenderness.  I think another injection in a month or 2 is indicated if her symptoms recur.  I will see her back as needed  Follow-Up Instructions: Return if symptoms worsen or fail to improve.   Orders:  No orders of the defined types were placed in this encounter.  No orders of the defined types were placed in this encounter.     Procedures: No procedures performed   Clinical Data: No additional findings.  Objective: Vital Signs: There were no vitals taken for this visit.  Physical Exam:   Constitutional: Patient appears well-developed HEENT:  Head:  Normocephalic Eyes:EOM are normal Neck: Normal range of motion Cardiovascular: Normal rate Pulmonary/chest: Effort normal Neurologic: Patient is alert Skin: Skin is warm Psychiatric: Patient has normal mood and affect    Ortho Exam: Ortho exam demonstrates full active and passive range of motion of that left shoulder.  Impingement signs equivocal on the left negative on the right.  No discrete AC joint tenderness is present.  No restriction of external rotation of 15 degrees of abduction.  Motor or sensory function to the hand is intact.  No masses lymphadenopathy or skin changes noted in that shoulder girdle region  Specialty Comments:  No specialty comments available.  Imaging: No results found.   PMFS History: Patient Active Problem List   Diagnosis Date Noted  . Atrial fibrillation with RVR ([place]) 11/03/2017  . LVH (left ventricular hypertrophy) 09/22/2017  . Diabetes mellitus without complication ([place]) 29/52/8413  . Degeneration of lumbar intervertebral disc 07/06/2017  . Pulmonary emboli ([place]) 05/02/2016  . Right leg pain 05/01/2016  . ILD (interstitial lung disease) ([place]) 05/01/2016  . Bradycardia 05/01/2016  . Coronary artery calcification seen on CAT scan 08/06/2015  . Open angle with borderline findings, low risk, bilateral 04/29/2015  . UIP (usual interstitial pneumonitis) ([place]) 04/05/2015  . Bilateral posterior capsular opacification 09/21/2014  . Pseudophakia of both eyes 07/27/2014  . Combined form of senile cataract of both eyes 05/30/2014  .  Osteoarthritis 09/17/2011  . Narrowing of intervertebral disc space 09/17/2011  . Pyelonephritis with possible nonobstructing 5 mm calculus by renal ultrasound 09/16/2011  . Hypokalemia 09/16/2011  . External hemorrhoids 09/13/2011  . Obstructive sleep apnea syndrome 06/10/2011  . Anal fissure 02/17/2011  . History of colonic polyps 04/03/2010  . Respiratory condition due to chemical fumes and vapors ([place]) 01/20/2010    . Urinary incontinence 04/30/2009  . Insomnia 09/28/2007  . Asthma 11/22/2006  . Dyslipidemia 08/19/2006  . HTN (hypertension) 08/19/2006  . GERD 08/19/2006  . Low back pain 08/19/2006   Past Medical History:  Diagnosis Date  . Anxiety   . Arthritis   . Asthma   . Atrial fibrillation ([place])   . CHF (congestive heart failure) ([place])    pt. unsure- but thinks she was hosp. for CHF- 2002  . [facility]  . Clotting disorder ([place])    blood clots in lungs/PE pulmonary embolism  . Colon polyps   . Complication of anesthesia    states requires a lot med. to put her to sleep   . DDD (degenerative disc disease) 09/17/2011  . Depression    "sometimes "  . DM (diabetes mellitus) ([place])   . Family history of anesthesia complication   . GERD (gastroesophageal reflux disease)   . Glaucoma    bilateral, pt. admits that she is noncompliant to eye gtts.   . Hemorrhoids   . Hyperlipidemia   . Hypertension    had stress, echo- 2006 /w [place], Cardiac Cath, per pt. 2002, echo repeated 2012- wnl   . Low back pain   . Shortness of breath   . Sleep apnea    uses c-pap- q night recently    Family History  Problem Relation Age of Onset  . Heart attack Mother   . Heart disease Mother   . Breast cancer Mother   . Emphysema Sister   . Breast cancer Sister   . Arthritis/Rheumatoid Sister   . Asthma Sister   . Lung cancer Sister   . COPD Sister   . Colon cancer Brother   . Colon cancer Brother   . Anesthesia problems Neg Hx     Past Surgical History:  Procedure Laterality Date  . ABDOMINAL HYSTERECTOMY     ectopic, fibroids  . arm surgery Right   . CARDIAC CATHETERIZATION    . CATARACT EXTRACTION, BILATERAL     cataracts removed bilateral- ?IOL  . COLONOSCOPY     remote  . FISSURECTOMY  10/08/2011   Procedure: FISSURECTOMY;  Surgeon: Stark Klein, MD;  Location: [place];  Service: General;  Laterality: N/A;  . FLEXIBLE SIGMOIDOSCOPY  02/25/2011    Procedure: FLEXIBLE SIGMOIDOSCOPY;  Surgeon: Inda Castle, MD;  [facility];  Service: Endoscopy;  Laterality: N/A;  . FOOT SURGERY     bilat, heel spurs- screw in R foot   . HEMORRHOID SURGERY  10/08/2011   Procedure: HEMORRHOIDECTOMY;  Surgeon: Stark Klein, MD;  Location: [place];  Service: General;  Laterality: N/A;  External   . SPHINCTEROTOMY  10/08/2011   Procedure: Joan Mayans;  Surgeon: Stark Klein, MD;  Location: [place];  Service: General;  Laterality: N/A;   Social History   Occupational History  . Occupation: retired Forensic psychologist: UNEMPLOYED  Tobacco Use  . Smoking status: Never Smoker  . Smokeless tobacco: Never Used  Substance and Sexual Activity  . Alcohol use: No    Alcohol/week:  0.0 standard drinks  . Drug use: No  . Sexual activity: Never

## 2017-12-31 ENCOUNTER — Encounter

## 2018-01-03 ENCOUNTER — Ambulatory Visit: Payer: Medicare Other | Admitting: Physical Therapy

## 2018-01-03 ENCOUNTER — Encounter: Payer: Self-pay | Admitting: Physical Therapy

## 2018-01-03 DIAGNOSIS — R600 Localized edema: Secondary | ICD-10-CM

## 2018-01-03 DIAGNOSIS — M25512 Pain in left shoulder: Principal | ICD-10-CM

## 2018-01-03 DIAGNOSIS — M25612 Stiffness of left shoulder, not elsewhere classified: Secondary | ICD-10-CM

## 2018-01-03 DIAGNOSIS — M6281 Muscle weakness (generalized): Secondary | ICD-10-CM

## 2018-01-03 DIAGNOSIS — G8929 Other chronic pain: Secondary | ICD-10-CM

## 2018-01-03 NOTE — Therapy (Addendum)
[place], [place], [address] Phone: [phone]   Fax:  [phone]  Physical Therapy Treatment & Discharge   Patient Details  Name: Miranda [name] MRN: [id number] Date of Birth: Apr 11, 1945 Referring Provider (PT): Marzetta Board MD   Encounter Date: 01/03/2018  PT End of Session - 01/03/18 1734    Visit Number  6    Number of Visits  13    Date for PT Re-Evaluation  01/03/18    Authorization Type  MCR (Kx mod by 15th visit and progress note by 10th)    PT Start Time  1415    PT Stop Time  1502    PT Time Calculation (min)  47 min    Activity Tolerance  Patient tolerated treatment well    Behavior During Therapy  [facility] for tasks assessed/performed       Past Medical History:  Diagnosis Date  . Anxiety   . Arthritis   . Asthma   . Atrial fibrillation ([place])   . CHF (congestive heart failure) ([place])    pt. unsure- but thinks she was hosp. for CHF- 2002  . [facility] kidney disease    [facility]  . Clotting disorder ([place])    blood clots in lungs/PE pulmonary embolism  . Colon polyps   . Complication of anesthesia    states requires a lot med. to put her to sleep   . DDD (degenerative disc disease) 09/17/2011  . Depression    "sometimes "  . DM (diabetes mellitus) ([place])   . Family history of anesthesia complication   . GERD (gastroesophageal reflux disease)   . Glaucoma    bilateral, pt. admits that she is noncompliant to eye gtts.   . Hemorrhoids   . Hyperlipidemia   . Hypertension    had stress, echo- 2006 /w [place], Cardiac Cath, per pt. 2002, echo repeated 2012- wnl   . Low back pain   . Shortness of breath   . Sleep apnea    uses c-pap- q night recently    Past Surgical History:  Procedure Laterality Date  . ABDOMINAL HYSTERECTOMY     ectopic, fibroids  . arm surgery Right   . CARDIAC CATHETERIZATION    . CATARACT EXTRACTION, BILATERAL     cataracts removed bilateral-  ?IOL  . COLONOSCOPY     remote  . FISSURECTOMY  10/08/2011   Procedure: FISSURECTOMY;  Surgeon: Stark Klein, MD;  Location: [place];  Service: General;  Laterality: N/A;  . FLEXIBLE SIGMOIDOSCOPY  02/25/2011   Procedure: FLEXIBLE SIGMOIDOSCOPY;  Surgeon: Inda Castle, MD;  Location: [facility];  Service: Endoscopy;  Laterality: N/A;  . FOOT SURGERY     bilat, heel spurs- screw in R foot   . HEMORRHOID SURGERY  10/08/2011   Procedure: HEMORRHOIDECTOMY;  Surgeon: Stark Klein, MD;  Location: [place];  Service: General;  Laterality: N/A;  External   . SPHINCTEROTOMY  10/08/2011   Procedure: Joan Mayans;  Surgeon: Stark Klein, MD;  Location: [place];  Service: General;  Laterality: N/A;    There were no vitals filed for this visit.  Subjective Assessment - 01/03/18 1421    Subjective  Pt had a steroid injection since last PT session and report pain feels much better. Not having much pain in shoulder with lifting arm above head or with mopping/cleaning activities.    Pertinent History  hx of Afib and pulmonary embolism    Limitations  Lifting  How long can you sit comfortably?  unlimited    How long can you stand comfortably?  30 min    How long can you walk comfortably?  15 min     Diagnostic tests  x-ray 11/03/2017    Patient Stated Goals  to calm down the pain, to return to bowling, return to house work and general activities    Currently in Pain?  Yes    Pain Score  4     Pain Location  Shoulder    Pain Orientation  Left    Pain Descriptors / Indicators  Sharp    Pain Type  Chronic pain    Pain Onset  More than a month ago    Aggravating Factors   reaching, activity     Pain Relieving Factors  ice, rest     Effect of Pain on Daily Activities  difficulty with reaching ADLs,chores, sleep distrurabances          [facility] PT Assessment - 01/03/18 0001      Posture/Postural Control   Postural Limitations  Rounded Shoulders;Forward head      AROM   Left Shoulder Flexion  144  Degrees    Left Shoulder ABduction  99 Degrees    Left Shoulder Internal Rotation  --   L5   Left Shoulder External Rotation  --   Behding head without any pain      Strength   Right/Left Shoulder  --   Pt 4/5 gross L shoulder strength   Left Hand Grip (lbs)  20   pain free     Palpation   Palpation comment  TTP at the suprispinatus and incresase tenderness over bicep       Special Tests   Rotator Cuff Impingment tests  Michel Bickers test   negative      Hawkins-Kennedy test   Findings  Negative    Side  Left                           PT Education - 01/03/18 1733    Education Details  Reviewed additional HEP and emphasised cont. with initial HEP at d/c     Person(s) Educated  Patient    Methods  Explanation;Demonstration;Handout;Verbal cues    Comprehension  Verbalized understanding;Returned demonstration;Verbal cues required       PT Short Term Goals - 01/03/18 1437      PT SHORT TERM GOAL #1   Title  pt to be I with inital HEP    Time  3    Period  Weeks    Status  Achieved      PT SHORT TERM GOAL #2   Title  pt to verbalize/ demo techniques for proper posture to prevent L shoulder pain     Time  3    Period  Weeks    Status  Achieved      PT SHORT TERM GOAL #3   Title  increase L shoulder AAROM flexion/ abduction to >/= 90 degrees with </= 6/10 pain for functional and therapuetic progression    Time  3    Period  Weeks    Status  Achieved        PT Long Term Goals - 01/03/18 1437      PT LONG TERM GOAL #1   Title  increase L shoulder flexion/ abduction to >/= 120 and IR/ER to >/= 50 degrees with </= 5/10 pain for functional ROm  for ADLs    Baseline  Met flexion goal; 100 deg abduction     Period  Weeks    Status  Partially Met      PT LONG TERM GOAL #2   Title  pt to increase L shoulder strength to >/= 4/5 in all planes to promote shoulder stability with household lifting activities     Time  6    Period  Weeks    Status   Achieved      PT LONG TERM GOAL #3   Title  pt to be able to lift and carry >/= 8# for functional lifting activitys and for pt's goal of return to bowling with </= 4/10 pain     Baseline  Pt able to carry #8 weight without any pain     Time  6    Period  Weeks    Status  Achieved      PT LONG TERM GOAL #4   Title  pt to be I with intial HEp given as of last visit    Time  6    Period  Weeks    Status  Achieved            Plan - 01/03/18 1742    Clinical Impression Statement  Patient demonstrated decreased pain and overall improvements in L shoulder strength and AROM. All short term goals have been achieved and all but one long term goal met as pt still lacking full abduction secondary to pain. Additional exercises added to HEP in order to promote strengthening of suprispinatus including AROM in scaption and abduction resisted walk outs. Pt reports she is able to manage pain independelty with HEP and is no longer limited with ADLs. Pt reports she does not believe she will require further imaging due to pain relief with PT in conjunction with steroid shot. Discharging pt at this time as she demonstrates improvements in pain, mobility and overall strength which no longer limits function.     History and Personal Factors relevant to plan of care:  hx of recent hospital admission due to Afib, depression, anxiety     Clinical Presentation  Stable    Clinical Presentation due to:  worsening pain since onset, limited shoulder ROM, limited strength, muscle spasm/guarding     Clinical Decision Making  High    Rehab Potential  Fair    PT Frequency  2x / week    PT Duration  6 weeks    PT Treatment/Interventions  ADLs/Self Care Home Management;Cryotherapy;Electrical Stimulation;Iontophoresis 48m/ml Dexamethasone;Moist Heat;Ultrasound;Taping;Vasopneumatic Device;Manual techniques;Therapeutic exercise;Therapeutic activities;Passive range of motion;Dry needling;Gait training;Patient/family education     PT Home Exercise Plan  table slides flexion/ abduction, upper trap stretching, scapular retraction, internal rotation/ external rotation, rows, and wall walks flexion, abd isometrics and abd arom in scaption     Consulted and Agree with Plan of Care  Patient       Patient will benefit from skilled therapeutic intervention in order to improve the following deficits and impairments:     Visit Diagnosis: Chronic left shoulder pain  Stiffness of left shoulder, not elsewhere classified  Muscle weakness (generalized)  Localized edema     Problem List Patient Active Problem List   Diagnosis Date Noted  . [place]rial [place] with [place]VR [place] 11/03/2017  . LVH (left ventricular hypertrophy) 09/22/2017  . Diabetes mellitus without complication ([place] 043/15/4008 . Degeneration of lumbar intervertebral disc 07/06/2017  . Pulmonary emboli ([place] 05/02/2016  . Right leg pain 05/01/2016  .  ILD (interstitial lung disease) ([place]) 05/01/2016  . Bradycardia 05/01/2016  . Coronary artery calcification seen on CAT scan 08/06/2015  . Open angle with borderline findings, low risk, bilateral 04/29/2015  . UIP (usual interstitial pneumonitis) ([place]) 04/05/2015  . Bilateral posterior capsular opacification 09/21/2014  . Pseudophakia of both eyes 07/27/2014  . Combined form of senile cataract of both eyes 05/30/2014  . Osteoarthritis 09/17/2011  . Narrowing of intervertebral disc space 09/17/2011  . Pyelonephritis with possible nonobstructing 5 mm calculus by renal ultrasound 09/16/2011  . Hypokalemia 09/16/2011  . External hemorrhoids 09/13/2011  . Obstructive sleep apnea syndrome 06/10/2011  . Anal fissure 02/17/2011  . History of colonic polyps 04/03/2010  . Respiratory condition due to chemical fumes and vapors ([place]) 01/20/2010  . Urinary incontinence 04/30/2009  . Insomnia 09/28/2007  . Asthma 11/22/2006  . Dyslipidemia 08/19/2006  . HTN (hypertension) 08/19/2006  . GERD 08/19/2006  . Low  back pain 08/19/2006    Einar Crow SPT 01/03/2018, 5:54 PM  [facility] [address] [place], [place], [address] Phone: [phone]   Fax:  [phone]  Name: BRANDACE CARGLE MRN: [id number] Date of Birth: 1945-10-06       PHYSICAL THERAPY DISCHARGE SUMMARY  Visits from Start of Care: 6   Current functional level related to goals / functional outcomes:  Improvements in overall shoulder ROM and strength   Remaining deficits: Shoulder abduction AROM remains limited secondary to pain with palpation    Education / Equipment:  HEP, posture, theraband, anatomy of area involved, lifting mechanics   Plan: Patient agrees to discharge.  Patient goals were partially met. Patient is being discharged due to being pleased with the current functional level.  ?????

## 2018-01-05 ENCOUNTER — Other Ambulatory Visit: Payer: Self-pay | Admitting: Internal Medicine

## 2018-01-05 NOTE — Telephone Encounter (Signed)
Requested medication (s) are due for refill today: yes  Requested medication (s) are on the active medication list: yes  Last refill:  10/18/17  Future visit scheduled: no last seen 1 month ago  Notes to clinic:  Controlled substance   Requested Prescriptions  Pending Prescriptions Disp Refills   HYDROcodone-acetaminophen (NORCO) 10-325 MG tablet 90 tablet 0    Sig: Take 1 tablet by mouth every 6 (six) hours as needed for moderate pain. Refill 1 month     Not Delegated - Analgesics:  Opioid Agonist Combinations Failed - 01/05/2018  9:23 AM      Failed - This refill cannot be delegated      Failed - Urine Drug Screen completed in last 360 days.      Passed - Valid encounter within last 6 months    Recent Outpatient Visits          1 month ago Diabetes mellitus without complication ([place])   [place] at [facility], Doretha Sou, MD   2 months ago Chronic low back pain without sciatica, unspecified back pain laterality   Therapist, music at [facility], Doretha Sou, MD   3 months ago Diabetes mellitus without complication [facility])   [place] at [facility], Doretha Sou, MD   4 months ago Encounter for therapeutic drug Glass blower/designer at [facility], Doretha Sou, MD   5 months ago Essential hypertension   Therapist, music at [facility], Doretha Sou, MD      Future Appointments            In 4 weeks [name], [name], MD [place] [facility] [place], [place]   In 1 month [place], MD [facility]

## 2018-01-05 NOTE — Telephone Encounter (Signed)
Pt wanting Refill of Hydrocodone. Pt has an appt set up 11/20 with Dr.Hernandez.

## 2018-01-05 NOTE — Telephone Encounter (Signed)
Copied from [place] [id number]. Topic: Quick Communication - Rx Refill/Question >> Jan 05, 2018  9:11 AM Sheran Luz wrote: Medication: HYDROcodone-acetaminophen (NORCO) 10-325 MG tablet   Pt of Dr. Burnice Logan requesting refill of this medication until she can be seen by Dr. Jerilee Hoh.    Preferred Pharmacy (with phone number or street name):[facility] ([address] [place] [phone] (Phone) [phone] (Fax)

## 2018-01-06 ENCOUNTER — Other Ambulatory Visit: Payer: Self-pay

## 2018-01-06 MED ORDER — HYDROCODONE-ACETAMINOPHEN 10-325 MG PO TABS: 1.0000 | ORAL_TABLET | Freq: Four times a day (QID) | ORAL | 0 refills | Status: DC | PRN

## 2018-01-06 NOTE — Progress Notes (Signed)
Cardiology Office Note    Date:  01/07/2018   ID:  REBEL LAUGHRIDGE, DOB 1945/07/03, MRN [id number]  PCP:  Marletta Lor, MD  Cardiologist:  Younes Degeorge [name] MD  Chief Complaint  Patient presents with  . Edema    feet  . Palpitations    when walking  . Shortness of Breath    when moving    History of Present Illness:  Miranda [name] is a 72 y.o. female with PMH of depression, GERD, hypertension, hyperlipidemia, obstructive sleep apnea on CPAP, DM 2 and atrial fibrillation.  Patient was remotely seen by me in the hospital on 08/30/2010 for consultation with regard to chest pain.  Her chest pain was felt to be atypical at the time. Lexiscan myoview obtained on 06/13/2014 showed EF 69%, normal perfusion and normal wall motion.  She was diagnosed with acute PE in February 2018 and was started on a 32-month regimen of Xarelto.  High-resolution CT obtained on 05/08/2016 suggested interstitial lung disease as well along with aortic atherosclerosis and two-vessel coronary artery disease.  Echocardiogram obtained on 05/02/2016 showed EF 60 to 65%, grade 1 DD, moderately dilated left atrium.  Repeat CT angiogram of the chest in July 2019 showed resolution of PE.  She was seen in the ED on 09/14/2017 after she was sent over from gastroenterology.  She apparently was undergoing colonoscopy when the staff found her heart rate to be fast and irregular.  She was found to be in new atrial fibrillation was rapid ventricular rate.  She spontaneously converted in the emergency room.  TSH was normal.  Troponin negative x2.  Patient was started on Xarelto and metoprolol. Echo showed normal LV and valvular function. Mild atrial enlargement. Myoview study was normal. She was seen by Dr. Curt Bears and started on Multaq. CT scan showed mild pulmonary fibrosis and some calcification in the LAD and LCx.   On follow up today she does note she gets tired and has some DOE. No cough. No chest pain. Occasionally feels  lightheaded. She has lost weight. Feels AFib has improved on Multaq.     Past Medical History:  Diagnosis Date  . Anxiety   . Arthritis   . Asthma   . Atrial fibrillation ([place])   . CHF (congestive heart failure) ([place])    pt. unsure- but thinks she was hosp. for CHF- 2002  . [facility]  . Clotting disorder ([place])    blood clots in lungs/PE pulmonary embolism  . Colon polyps   . Complication of anesthesia    states requires a lot med. to put her to sleep   . DDD (degenerative disc disease) 09/17/2011  . Depression    "sometimes "  . DM (diabetes mellitus) ([place])   . Family history of anesthesia complication   . GERD (gastroesophageal reflux disease)   . Glaucoma    bilateral, pt. admits that she is noncompliant to eye gtts.   . Hemorrhoids   . Hyperlipidemia   . Hypertension    had stress, echo- 2006 /w [place], Cardiac Cath, per pt. 2002, echo repeated 2012- wnl   . Low back pain   . Shortness of breath   . Sleep apnea    uses c-pap- q night recently    Past Surgical History:  Procedure Laterality Date  . ABDOMINAL HYSTERECTOMY     ectopic, fibroids  . arm surgery Right   . CARDIAC CATHETERIZATION    . CATARACT  EXTRACTION, BILATERAL     cataracts removed bilateral- ?IOL  . COLONOSCOPY     remote  . FISSURECTOMY  10/08/2011   Procedure: FISSURECTOMY;  Surgeon: Stark Klein, MD;  Location: [facility];  Service: General;  Laterality: N/A;  . FLEXIBLE SIGMOIDOSCOPY  02/25/2011   Procedure: FLEXIBLE SIGMOIDOSCOPY;  Surgeon: Inda Castle, MD;  Location: [facility];  Service: Endoscopy;  Laterality: N/A;  . FOOT SURGERY     bilat, heel spurs- screw in R foot   . HEMORRHOID SURGERY  10/08/2011   Procedure: HEMORRHOIDECTOMY;  Surgeon: Stark Klein, MD;  Location: [facility];  Service: General;  Laterality: N/A;  External   . SPHINCTEROTOMY  10/08/2011   Procedure: Joan Mayans;  Surgeon: Stark Klein, MD;  Location: [facility];  Service: General;   Laterality: N/A;    Current Medications: Outpatient Medications Prior to Visit  Medication Sig Dispense Refill  . ALPRAZolam (XANAX) 0.25 MG tablet Take 1 tablet (0.25 mg total) by mouth 3 (three) times daily as needed for anxiety or sleep. 30 tablet 5  . amLODipine (NORVASC) 10 MG tablet TAKE 1 TABLET BY MOUTH ONCE DAILY 90 tablet 1  . atorvastatin (LIPITOR) 80 MG tablet Take 1 tablet (80 mg total) by mouth daily. 30 tablet 3  . diclofenac sodium (VOLTAREN) 1 % GEL Apply 2 g topically 4 (four) times daily. 1 Tube 1  . dronedarone (MULTAQ) 400 MG tablet Take 1 tablet (400 mg total) by mouth 2 (two) times daily with a meal. 60 tablet 3  . fluticasone (FLONASE) 50 MCG/ACT nasal spray Place 1 spray into both nostrils daily. (Patient taking differently: Place 1 spray into both nostrils daily as needed for allergies. ) 16 g 0  . furosemide (LASIX) 40 MG tablet Take 0.5 tablets (20 mg total) by mouth daily. 90 tablet 2  . HYDROcodone-acetaminophen (NORCO) 10-325 MG tablet Take 1 tablet by mouth every 6 (six) hours as needed for moderate pain. Refill 1 month 90 tablet 0  . lactulose (CONSTULOSE) 10 GM/15ML solution TAKE 30 MILLILITERS THREE TIMES DAILY BY  MOUTH AS NEEDED FOR MILD CONSTIPATION 473 mL 4  . losartan (COZAAR) 100 MG tablet Take 1 tablet (100 mg total) by mouth daily. 90 tablet 3  . metFORMIN (GLUCOPHAGE-XR) 500 MG 24 hr tablet Take 2 tablets (1,000 mg total) by mouth daily with breakfast. 180 tablet 3  . metoprolol tartrate (LOPRESSOR) 25 MG tablet Take 1 tablet (25 mg total) by mouth 2 (two) times daily. 180 tablet 6  . Pirfenidone (ESBRIET) 801 MG TABS Take 1 tablet by mouth 3 (three) times daily. 90 tablet 11  . rivaroxaban (XARELTO) 20 MG TABS tablet Take 1 tablet (20 mg total) by mouth daily with supper. 30 tablet 6  . cyclobenzaprine (FLEXERIL) 10 MG tablet Take 1 tablet (10 mg total) by mouth 3 (three) times daily as needed. (Patient not taking: Reported on 01/07/2018) 90 tablet 1   . amLODipine (NORVASC) 10 MG tablet Take 1 tablet (10 mg total) by mouth daily. 90 tablet 1   No facility-administered medications prior to visit.      Allergies:   Fish oil; Other; Penicillins; Pneumococcal vaccines; Nitrofurantoin macrocrystal; Aspirin; Bactrim [sulfamethoxazole-trimethoprim]; Ciprofloxacin; Ibuprofen; Influenza vaccines; Ivp dye [iodinated diagnostic agents]; Latex; Macrobid [nitrofurantoin monohydrate macrocrystals]; and Shellfish allergy   Social History   Socioeconomic History  . Marital status: Married    Spouse name: Not on file  . Number of children: 4  . Years of education: Not on file  .  Highest education level: Not on file  Occupational History  . Occupation: retired Forensic psychologist: UNEMPLOYED  Social Needs  . Financial resource strain: Not on file  . Food insecurity:    Worry: Not on file    Inability: Not on file  . Transportation needs:    Medical: Not on file    Non-medical: Not on file  Tobacco Use  . Smoking status: Never Smoker  . Smokeless tobacco: Never Used  Substance and Sexual Activity  . Alcohol use: No    Alcohol/week: 0.0 standard drinks  . Drug use: No  . Sexual activity: Never  Lifestyle  . Physical activity:    Days per week: Not on file    Minutes per session: Not on file  . Stress: Not on file  Relationships  . Social connections:    Talks on phone: Not on file    Gets together: Not on file    Attends religious service: Not on file    Active member of club or organization: Not on file    Attends meetings of clubs or organizations: Not on file    Relationship status: Not on file  Other Topics Concern  . Not on file  Social History Narrative  . Not on file     Family History:  The patient's family history includes Arthritis/Rheumatoid in her sister; Asthma in her sister; Breast cancer in her mother and sister; COPD in her sister; Colon cancer in her brother and brother; Emphysema in her sister; Heart attack in her  mother; Heart disease in her mother; Lung cancer in her sister.   ROS:   Please see the history of present illness.    ROS All other systems reviewed and are negative.   PHYSICAL EXAM:   VS:  BP 130/70   Pulse (!) 44   Ht 5\' 5"  (1.651 m)   Wt 195 lb 12.8 oz (88.8 kg)   SpO2 98%   BMI 32.58 kg/m    GEN: Well nourished, well developed, in no acute distress  HEENT: normal  Neck: no JVD, carotid bruits, or masses Cardiac: RRR; no rubs, or gallops,no edema  2/6 murmur Respiratory:  clear to auscultation bilaterally, normal work of breathing GI: soft, nontender, nondistended, + BS MS: no deformity or atrophy  Skin: warm and dry, no rash Neuro:  Alert and Oriented x 3, Strength and sensation are intact Psych: euthymic mood, full affect  Wt Readings from Last 3 Encounters:  01/07/18 195 lb 12.8 oz (88.8 kg)  12/01/17 199 lb (90.3 kg)  11/23/17 199 lb (90.3 kg)      Studies/Labs Reviewed:   EKG:  EKG is not ordered today.    Recent Labs: 06/09/2017: ALT 16 09/14/2017: B Natriuretic Peptide 29.7; Magnesium 2.3; TSH 1.499 11/02/2017: Hemoglobin 14.6; Platelets 185 11/04/2017: BUN 6; Creatinine, Ser 0.66; Potassium 4.1; Sodium 142   Lipid Panel    Component Value Date/Time   CHOL 167 11/26/2017 0908   TRIG 75 11/26/2017 0908   HDL 61 11/26/2017 0908   CHOLHDL 2.7 11/26/2017 0908   CHOLHDL 4 06/01/2014 1145   VLDL 16.8 06/01/2014 1145   LDLCALC 91 11/26/2017 0908   LDLDIRECT 152.8 03/30/2011 0859    Additional studies/ records that were reviewed today include:   Myoview 06/13/2014 Impression Exercise Capacity:  Lexiscan with no exercise. BP Response:  Hypertensive blood pressure response. Clinical Symptoms:  Headache/abdominal pain ECG Impression:  No significant ST segment change suggestive of ischemia. Comparison with  Prior Nuclear Study: No images to compare  Overall Impression:  Normal stress nuclear study.  LV Ejection Fraction: 69%.  LV Wall Motion:  NL LV  Function; NL Wall Motion      Echo 05/02/2016 LV EF: 60% -   65% Study Conclusions  - Left ventricle: The cavity size was normal. There was severe   concentric hypertrophy. Systolic function was normal. The   estimated ejection fraction was in the range of 60% to 65%.   Doppler parameters are consistent with abnormal left ventricular   relaxation (grade 1 diastolic dysfunction). - Left atrium: The atrium was moderately dilated. - Atrial septum: No defect or patent foramen ovale was identified.     CT of chest 05/08/2016 IMPRESSION: 1. The appearance of the lungs remains compatible with interstitial lung disease. The overall findings remain most suggestive of usual interstitial pneumonia (UIP), but appears centrally unchanged compared to prior study from 06/24/2015. 2. Aortic atherosclerosis, in addition to 2 vessel coronary artery disease. Please note that although the presence of coronary artery calcium documents the presence of coronary artery disease, the severity of this disease and any potential stenosis cannot be assessed on this non-gated CT examination. Assessment for potential risk factor modification, dietary therapy or pharmacologic therapy may be warranted, if clinically indicated.  Echo 10/19/17: Study Conclusions  - Left ventricle: The cavity size was normal. Systolic function was   normal. The estimated ejection fraction was in the range of 60%   to 65%. Wall motion was normal; there were no regional wall   motion abnormalities. Doppler parameters are consistent with   abnormal left ventricular relaxation (grade 1 diastolic   dysfunction). - Aortic valve: There was no significant regurgitation. - Mitral valve: There was trivial regurgitation. - Left atrium: The atrium was mildly dilated. - Right ventricle: The cavity size was mildly dilated. Wall   thickness was normal. - Tricuspid valve: There was trivial regurgitation. - Pulmonic valve: There was trivial  regurgitation. - Inferior vena cava: The vessel was normal in size. The   respirophasic diameter changes were in the normal range (>= 50%),   consistent with normal central venous pressure. one Impressions:  - Normal LV systolic function. No significant valvular disease.  Myoview 11/12/17:Study Highlights    Nuclear stress EF: 66%.  There was no ST segment deviation noted during stress.  No T wave inversion was noted during stress.  The study is normal.  This is a low risk study.  The left ventricular ejection fraction is hyperdynamic (>65%).      ASSESSMENT:    1. Paroxysmal atrial fibrillation (HCC)   2. Essential hypertension   3. OSA on CPAP      PLAN:  In order of problems listed above:  1. Paroxysmal atrial fibrillation: CHA2DS-Vasc score 4 (age, female, HTN and DM II).  Now on Xarelto for anticoagulation.    Now on Multaq with improved symptoms. She is quite bradycardic today with HR 44. Will reduce metoprolol to 12.5 mg bid.   2. Mild pulmonary fibrosis. I suspect this is why she has DOE.   3. Hypertension: controlled.  4. Hyperlipidemia: On Lipitor 20 mg daily.  LDL 90.  5. DM2: On metformin.  Recent hemoglobin A1c obtained on 09/22/2017 was 5.9.  6.   CAD with coronary calcification noted on CT but normal Myoview. Risk factor modification.  I will follow up in one year. Keep follow up with Dr. Curt Bears for her Afib.     Medication Adjustments/Labs and  Tests Ordered: Current medicines are reviewed at length with the patient today.  Concerns regarding medicines are outlined above.  Medication changes, Labs and Tests ordered today are listed in the Patient Instructions below. Patient Instructions  Reduce metoprolol to 12.5 mg twice a day  Continue your other therapy  I will see you in one year     Signed, Randalyn Ahmed [name], MD  01/07/2018 11:39 AM    [facility] [place], [place], [place]  [address] Phone: [phone]; Fax: [phone]

## 2018-01-07 ENCOUNTER — Encounter: Payer: Self-pay | Admitting: Cardiology

## 2018-01-07 ENCOUNTER — Ambulatory Visit: Payer: Medicare Other | Admitting: Cardiology

## 2018-01-07 VITALS — BP 130/70 | HR 44 | Ht 65.0 in | Wt 195.8 lb

## 2018-01-07 DIAGNOSIS — Z9989 Dependence on other enabling machines and devices: Secondary | ICD-10-CM

## 2018-01-07 DIAGNOSIS — I10 Essential (primary) hypertension: Secondary | ICD-10-CM

## 2018-01-07 DIAGNOSIS — G4733 Obstructive sleep apnea (adult) (pediatric): Secondary | ICD-10-CM | POA: Diagnosis not present

## 2018-01-07 DIAGNOSIS — I480 Paroxysmal atrial fibrillation: Secondary | ICD-10-CM | POA: Diagnosis not present

## 2018-01-07 MED ORDER — METOPROLOL TARTRATE 25 MG PO TABS: 12.5000 mg | ORAL_TABLET | Freq: Two times a day (BID) | ORAL | 6 refills | Status: DC

## 2018-01-07 NOTE — Patient Instructions (Addendum)
Reduce metoprolol to 12.5 mg twice a day  Continue your other therapy  I will see you in one year

## 2018-02-02 ENCOUNTER — Ambulatory Visit (INDEPENDENT_AMBULATORY_CARE_PROVIDER_SITE_OTHER): Payer: Medicare Other | Admitting: Internal Medicine

## 2018-02-02 ENCOUNTER — Encounter: Payer: Self-pay | Admitting: *Deleted

## 2018-02-02 ENCOUNTER — Ambulatory Visit (INDEPENDENT_AMBULATORY_CARE_PROVIDER_SITE_OTHER)
Admission: RE | Admit: 2018-02-02 | Discharge: 2018-02-02 | Disposition: A | Discharge status: Home / Self Care | Payer: Medicare Other | Source: Ambulatory Visit | Attending: Internal Medicine | Admitting: Internal Medicine

## 2018-02-02 ENCOUNTER — Encounter: Payer: Self-pay | Admitting: Internal Medicine

## 2018-02-02 VITALS — BP 112/68 | HR 49 | Temp 98.3°F | Wt 196.8 lb

## 2018-02-02 DIAGNOSIS — E785 Hyperlipidemia, unspecified: Secondary | ICD-10-CM

## 2018-02-02 DIAGNOSIS — E119 Type 2 diabetes mellitus without complications: Secondary | ICD-10-CM | POA: Diagnosis not present

## 2018-02-02 DIAGNOSIS — M19012 Primary osteoarthritis, left shoulder: Secondary | ICD-10-CM

## 2018-02-02 DIAGNOSIS — Z13820 Encounter for screening for osteoporosis: Secondary | ICD-10-CM

## 2018-02-02 DIAGNOSIS — Z5181 Encounter for therapeutic drug level monitoring: Secondary | ICD-10-CM

## 2018-02-02 DIAGNOSIS — I10 Essential (primary) hypertension: Secondary | ICD-10-CM

## 2018-02-02 DIAGNOSIS — J849 Interstitial pulmonary disease, unspecified: Secondary | ICD-10-CM

## 2018-02-02 DIAGNOSIS — I4891 Unspecified atrial fibrillation: Secondary | ICD-10-CM

## 2018-02-02 DIAGNOSIS — F419 Anxiety disorder, unspecified: Secondary | ICD-10-CM

## 2018-02-02 LAB — HEMOGLOBIN A1C: HEMOGLOBIN A1C: 6 % (ref 4.6–6.5)

## 2018-02-02 MED ORDER — ALPRAZOLAM 0.25 MG PO TABS: 0.2500 mg | ORAL_TABLET | Freq: Three times a day (TID) | ORAL | 0 refills | Status: DC | PRN

## 2018-02-02 MED ORDER — HYDROCODONE-ACETAMINOPHEN 10-325 MG PO TABS: 1.0000 | ORAL_TABLET | Freq: Four times a day (QID) | ORAL | 0 refills | Status: DC | PRN

## 2018-02-02 NOTE — Patient Instructions (Addendum)
BEFORE YOU LEAVE: -Labs  We have ordered labs or studies at this visit. It can take up to 1-2 weeks for results and processing. IF results require follow up or explanation, we will call you with instructions. Clinically stable results will be released to your [facility]. If you have not heard from [place] or cannot find your results in [facility] in 2 weeks please contact our office at [phone].  If you are not yet signed up for [facility], please consider signing up.   -Follow up: 3 to 4 months please come fasting to this appointment.

## 2018-02-02 NOTE — Progress Notes (Addendum)
Established Patient Office Visit     CC/Reason for Visit: Establish care, medication refills, follow-up chronic medical conditions  HPI: Miranda [name] is a 72 y.o. female who is coming in today for the above mentioned reasons. Past Medical History is significant for: Atrial fibrillation chronically anticoagulated on Xarelto on rhythm therapy with Multaq and rate controlled on metoprolol (beta-blocker dose was decreased at last visit due to bradycardia), benign essential hypertension that has been well controlled, prior history of PE, interstitial lung disease, GERD, type 2 diabetes, hyperlipidemia that is uncontrolled and statin was recently increased.  She has also been complaining of left shoulder pain and saw orthopedics, Dr. Marlou Sa, who recommended continued physical therapy and PRN injections.  She has no acute complaints this visit.  She is here for pain management refill.   Past Medical/Surgical History: Past Medical History:  Diagnosis Date  . Anxiety   . Arthritis   . Asthma   . Atrial fibrillation ([place])   . CHF (congestive heart failure) ([place])    pt. unsure- but thinks she was hosp. for CHF- 2002  . [facility]  . Clotting disorder ([place])    blood clots in lungs/PE pulmonary embolism  . Colon polyps   . Complication of anesthesia    states requires a lot med. to put her to sleep   . DDD (degenerative disc disease) 09/17/2011  . Depression    "sometimes "  . DM (diabetes mellitus) ([place])   . Family history of anesthesia complication   . GERD (gastroesophageal reflux disease)   . Glaucoma    bilateral, pt. admits that she is noncompliant to eye gtts.   . Hemorrhoids   . Hyperlipidemia   . Hypertension    had stress, echo- 2006 /w [place], Cardiac Cath, per pt. 2002, echo repeated 2012- wnl   . Low back pain   . Shortness of breath   . Sleep apnea    uses c-pap- q night recently    Past Surgical History:  Procedure  Laterality Date  . ABDOMINAL HYSTERECTOMY     ectopic, fibroids  . arm surgery Right   . CARDIAC CATHETERIZATION    . CATARACT EXTRACTION, BILATERAL     cataracts removed bilateral- ?IOL  . COLONOSCOPY     remote  . FISSURECTOMY  10/08/2011   Procedure: FISSURECTOMY;  Surgeon: Stark Klein, MD;  Location: [place];  Service: General;  Laterality: N/A;  . FLEXIBLE SIGMOIDOSCOPY  02/25/2011   Procedure: FLEXIBLE SIGMOIDOSCOPY;  Surgeon: Inda Castle, MD;  [facility];  Service: Endoscopy;  Laterality: N/A;  . FOOT SURGERY     bilat, heel spurs- screw in R foot   . HEMORRHOID SURGERY  10/08/2011   Procedure: HEMORRHOIDECTOMY;  Surgeon: Stark Klein, MD;  Location: [place];  Service: General;  Laterality: N/A;  External   . SPHINCTEROTOMY  10/08/2011   Procedure: Joan Mayans;  Surgeon: Stark Klein, MD;  Location: [place];  Service: General;  Laterality: N/A;    Social History:  reports that she has never smoked. She has never used smokeless tobacco. She reports that she does not drink alcohol or use drugs.  Allergies: Allergies  Allergen Reactions  . Fish Oil Anaphylaxis  . Other Hives, Shortness Of Breath and Swelling    Allergic to cashew nuts and peanut oil.  Marland Kitchen Penicillins Anaphylaxis, Hives and Swelling    Has patient had a PCN reaction causing immediate rash, facial/tongue/throat swelling,  SOB or lightheadedness with hypotension: Face swelling and hives started first, then swelling of the throat  Has patient had a PCN reaction causing severe rash involving mucus membranes or skin necrosis: Yes  Has patient had a PCN reaction that required hospitalization: No  Has patient had a PCN reaction occurring within the last 10 years: Yes  If all of the above answers are "NO", then may proceed with Cephalospor  . Pneumococcal Vaccines Nausea And Vomiting  . Nitrofurantoin Macrocrystal Hives  . Aspirin Itching and Rash  . Bactrim [Sulfamethoxazole-Trimethoprim] Hives, Itching  and Rash  . Ciprofloxacin Hives, Itching and Rash  . Ibuprofen Rash  . Influenza Vaccines Hives  . Ivp Dye [Iodinated Diagnostic Agents] Hives, Itching and Rash    Gives benadryl to counteract symptoms  . Latex Rash  . Macrobid [Nitrofurantoin Monohydrate Macrocrystals] Hives  . Shellfish Allergy Hives    Patient also allergic to seafood    Family History:  Family History  Problem Relation Age of Onset  . Heart attack Mother   . Heart disease Mother   . Breast cancer Mother   . Emphysema Sister   . Breast cancer Sister   . Arthritis/Rheumatoid Sister   . Asthma Sister   . Lung cancer Sister   . COPD Sister   . Colon cancer Brother   . Colon cancer Brother   . Anesthesia problems Neg Hx      Current Outpatient Medications:  .  albuterol (PROVENTIL HFA;VENTOLIN HFA) 108 (90 Base) MCG/ACT inhaler, Inhale into the lungs., Disp: , Rfl:  .  ALPRAZolam (XANAX) 0.25 MG tablet, Take 1 tablet (0.25 mg total) by mouth 3 (three) times daily as needed for anxiety or sleep., Disp: 20 tablet, Rfl: 0 .  amLODipine (NORVASC) 10 MG tablet, TAKE 1 TABLET BY MOUTH ONCE DAILY, Disp: 90 tablet, Rfl: 1 .  atorvastatin (LIPITOR) 80 MG tablet, Take 1 tablet (80 mg total) by mouth daily., Disp: 30 tablet, Rfl: 3 .  cyclobenzaprine (FLEXERIL) 10 MG tablet, Take 1 tablet (10 mg total) by mouth 3 (three) times daily as needed., Disp: 90 tablet, Rfl: 1 .  diclofenac sodium (VOLTAREN) 1 % GEL, Apply 2 g topically 4 (four) times daily., Disp: 1 Tube, Rfl: 1 .  dronedarone (MULTAQ) 400 MG tablet, Take 1 tablet (400 mg total) by mouth 2 (two) times daily with a meal., Disp: 60 tablet, Rfl: 3 .  escitalopram (LEXAPRO) 5 MG tablet, Take by mouth., Disp: , Rfl:  .  fluticasone (FLONASE) 50 MCG/ACT nasal spray, Place 1 spray into both nostrils daily. (Patient taking differently: Place 1 spray into both nostrils daily as needed for allergies. ), Disp: 16 g, Rfl: 0 .  furosemide (LASIX) 40 MG tablet, Take 0.5  tablets (20 mg total) by mouth daily., Disp: 90 tablet, Rfl: 2 .  HYDROcodone-acetaminophen (NORCO) 10-325 MG tablet, Take 1 tablet by mouth every 6 (six) hours as needed for moderate pain. Refill 1 month, Disp: 90 tablet, Rfl: 0 .  lactulose (CONSTULOSE) 10 GM/15ML solution, TAKE 30 MILLILITERS THREE TIMES DAILY BY  MOUTH AS NEEDED FOR MILD CONSTIPATION, Disp: 473 mL, Rfl: 4 .  losartan (COZAAR) 100 MG tablet, Take 1 tablet (100 mg total) by mouth daily., Disp: 90 tablet, Rfl: 3 .  metFORMIN (GLUCOPHAGE-XR) 500 MG 24 hr tablet, Take 2 tablets (1,000 mg total) by mouth daily with breakfast., Disp: 180 tablet, Rfl: 3 .  metoprolol tartrate (LOPRESSOR) 25 MG tablet, Take 0.5 tablets (12.5 mg total) by mouth  2 (two) times daily., Disp: 180 tablet, Rfl: 6 .  Pirfenidone (ESBRIET) 801 MG TABS, Take 1 tablet by mouth 3 (three) times daily., Disp: 90 tablet, Rfl: 11 .  rivaroxaban (XARELTO) 20 MG TABS tablet, Take 1 tablet (20 mg total) by mouth daily with supper., Disp: 30 tablet, Rfl: 6  Review of Systems:  Constitutional: Denies fever, chills, diaphoresis, appetite change and fatigue.  HEENT: Denies photophobia, eye pain, redness, hearing loss, ear pain, congestion, sore throat, rhinorrhea, sneezing, mouth sores, trouble swallowing, neck pain, neck stiffness and tinnitus.   Respiratory: Denies SOB, DOE, cough, chest tightness,  and wheezing.   Cardiovascular: Denies chest pain, palpitations and leg swelling.  Gastrointestinal: Denies nausea, vomiting, abdominal pain, diarrhea, constipation, blood in stool and abdominal distention.  Genitourinary: Denies dysuria, urgency, frequency, hematuria, flank pain and difficulty urinating.  Endocrine: Denies: hot or cold intolerance, sweats, changes in hair or nails, polyuria, polydipsia. Musculoskeletal: Denies myalgias, back pain, joint swelling, arthralgias and gait problem.  Skin: Denies pallor, rash and wound.  Neurological: Denies dizziness, seizures,  syncope, weakness, light-headedness, numbness and headaches.  Hematological: Denies adenopathy. Easy bruising, personal or family bleeding history  Psychiatric/Behavioral: Denies suicidal ideation, mood changes, confusion, nervousness, sleep disturbance and agitation    Physical Exam: Vitals:   02/02/18 1131  BP: 112/68  Pulse: (!) 49  Temp: 98.3 F (36.8 C)  TempSrc: Oral  SpO2: 96%  Weight: 196 lb 12.8 oz (89.3 kg)    Body mass index is 32.75 kg/m.   Constitutional: NAD, calm, comfortable Eyes: PERRL, lids and conjunctivae normal ENMT: Mucous membranes are moist. Posterior pharynx clear of any exudate or lesions. Normal dentition.  Neck: normal, supple, no masses, no thyromegaly Respiratory: clear to auscultation bilaterally, no wheezing, no crackles. Normal respiratory effort. No accessory muscle use.  Cardiovascular: Regular rate and rhythm, no murmurs / rubs / gallops. No extremity edema. 2+ pedal pulses. No carotid bruits.  Abdomen: no tenderness, no masses palpated. No hepatosplenomegaly. Bowel sounds positive.  Musculoskeletal: no clubbing / cyanosis. No joint deformity upper and lower extremities. Good ROM, no contractures. Normal muscle tone.  Skin: no rashes, lesions, ulcers. No induration Neurologic: CN 2-12 grossly intact. Sensation intact, DTR normal. Strength 5/5 in all 4.  Psychiatric: Normal judgment and insight. Alert and oriented x 3. Normal mood.    Impression and Plan:  Dyslipidemia -LDL was 123 in September/2019. -Lipitor was increased to 80 mg at that time. -Recheck lipids 2 to 3 months.  Essential hypertension -Well-controlled. -On Norvasc 10, losartan 100, metoprolol 12.5 twice daily  ILD (interstitial lung disease) (HCC) -No current oxygen requirement.  Diabetes mellitus without complication ([place]) - Lab Results  Component Value Date   HGBA1C 5.9 (A) 09/22/2017   -Due for repeat A1c today. -Discussed healthy lifestyle. -On metformin  1000 mg daily.   Atrial fibrillation with RVR (HCC) -Anticoagulated on Xarelto. -Currently rate controlled on metoprolol low-dose 12.5 twice daily (dose reduced recently by cardiology due to bradycardia). -Is also on dronedarone. -Followed by cardiology.  Screening for Osteoporosis -DEXA scan requested for today.  Chronic pain -Of back, shoulders. -Sees orthopedics for shoulder injections, occasionally also has back injections as well.  [place] printed, initialed  and scanned into the EMR.   Indication for chronic opioid:  Chronic back and shoulder pain, followed by orthopedics Medication and dose:  Hydrocodone 10/325 1 tablet every 6 hours as needed for pain # pills per month:  90 Last UDS date:  Will obtain today Opioid Treatment  Agreement signed:  On 11/20 Opioid Treatment Agreement last reviewed with patient:  Yes NCCSRS reviewed this encounter (include red flags):   He has no red flags     Patient Instructions  BEFORE YOU LEAVE: -Labs  We have ordered labs or studies at this visit. It can take up to 1-2 weeks for results and processing. IF results require follow up or explanation, we will call you with instructions. Clinically stable results will be released to your [facility]. If you have not heard from [place] or cannot find your results in [facility] in 2 weeks please contact our office at [phone].  If you are not yet signed up for [facility], please consider signing up.   -Follow up: 3 to 4 months please come fasting to this appointment.      [name], MD [place] [facility]  Patient ID: [name], female   DOB: [birth year], 72 y.o.   MRN: [id number]

## 2018-02-05 LAB — PAIN MGMT, PROFILE 8 W/CONF, U
6 Acetylmorphine: NEGATIVE ng/mL (ref <10)
ALPHAHYDROXYALPRAZOLAM: NEGATIVE ng/mL (ref <25)
ALPHAHYDROXYTRIAZOLAM: NEGATIVE ng/mL (ref <50)
AMPHETAMINES: NEGATIVE ng/mL (ref <500)
Alcohol Metabolites: NEGATIVE ng/mL (ref <500)
Alphahydroxymidazolam: NEGATIVE ng/mL (ref <50)
Aminoclonazepam: NEGATIVE ng/mL (ref <25)
BENZODIAZEPINES: NEGATIVE ng/mL (ref <100)
BUPRENORPHINE, URINE: NEGATIVE ng/mL (ref <5)
Cocaine Metabolite: NEGATIVE ng/mL (ref <150)
Codeine: NEGATIVE ng/mL (ref <50)
Creatinine: 24 mg/dL
Hydrocodone: 1013 ng/mL — ABNORMAL HIGH (ref <50)
Hydromorphone: 106 ng/mL — ABNORMAL HIGH (ref <50)
Hydroxyethylflurazepam: NEGATIVE ng/mL (ref <50)
Lorazepam: NEGATIVE ng/mL (ref <50)
MDMA: NEGATIVE ng/mL (ref <500)
Marijuana Metabolite: NEGATIVE ng/mL (ref <20)
Morphine: NEGATIVE ng/mL (ref <50)
NORDIAZEPAM: NEGATIVE ng/mL (ref <50)
Norhydrocodone: 791 ng/mL — ABNORMAL HIGH (ref <50)
OPIATES: POSITIVE ng/mL — AB (ref <100)
OXAZEPAM: NEGATIVE ng/mL (ref <50)
OXIDANT: NEGATIVE ug/mL (ref <200)
OXYCODONE: NEGATIVE ng/mL (ref <100)
PH: 6.76 (ref 4.5–9.0)
TEMAZEPAM: NEGATIVE ng/mL (ref <50)

## 2018-02-09 ENCOUNTER — Telehealth: Payer: Self-pay | Admitting: Pulmonary Disease

## 2018-02-09 NOTE — Telephone Encounter (Signed)
Attempted to call patient, no answer, left message to call back.  Received fax from [facility] reporting that they have been unable to reach patient to supply Esbriet.  Will attempt to call patient again another day to see if there are any issues with the medication.

## 2018-02-11 NOTE — Telephone Encounter (Signed)
Patient returning call.  States she is still taking Esbriet and has not run out.  States has been having problems with her phone.  CB is [phone].

## 2018-02-11 NOTE — Telephone Encounter (Signed)
FYI BJT

## 2018-02-14 NOTE — Telephone Encounter (Signed)
Patient is calling today stating she has a week left of Esbriet and needs to have this refilled.  Pharm is [facility], CB is [phone].

## 2018-02-14 NOTE — Telephone Encounter (Signed)
Spoke with pt, advised pt that she would need to contact [facility] to verify rx per the pharmacy.  Provided [facility] #: [phone]. Pt expressed understanding.  Nothing further needed at this time.

## 2018-02-16 ENCOUNTER — Ambulatory Visit (INDEPENDENT_AMBULATORY_CARE_PROVIDER_SITE_OTHER): Payer: Medicare Other | Admitting: Cardiology

## 2018-02-16 ENCOUNTER — Encounter: Payer: Self-pay | Admitting: Cardiology

## 2018-02-16 VITALS — BP 142/78 | HR 40 | Ht 65.0 in | Wt 193.0 lb

## 2018-02-16 DIAGNOSIS — G4733 Obstructive sleep apnea (adult) (pediatric): Secondary | ICD-10-CM

## 2018-02-16 DIAGNOSIS — E785 Hyperlipidemia, unspecified: Secondary | ICD-10-CM

## 2018-02-16 DIAGNOSIS — I2510 Atherosclerotic heart disease of native coronary artery without angina pectoris: Secondary | ICD-10-CM

## 2018-02-16 DIAGNOSIS — I10 Essential (primary) hypertension: Secondary | ICD-10-CM

## 2018-02-16 DIAGNOSIS — I480 Paroxysmal atrial fibrillation: Secondary | ICD-10-CM

## 2018-02-16 NOTE — Progress Notes (Signed)
Electrophysiology Office Note   Date:  02/16/2018   ID:  Miranda, [name] March 30, 1945, MRN [id number]  PCP:  [name], [name], MD  Cardiologist:  [name] Primary Electrophysiologist:  [name], MD    No chief complaint on file.    History of Present Illness: Miranda [name] is a 72 y.o. female who is being seen today for the evaluation of atrial fibrillation at the request of Miranda [name]. Presenting today for electrophysiology evaluation.  She has a history of depression, GERD, hypertension, hyperlipidemia, OSA on CPAP, type 2 diabetes, and recently diagnosed atrial fibrillation.  She was diagnosed with a PE February 2018 and was started on Xarelto for 6 months.  High-resolution CT showed interstitial lung disease as well as aortic atherosclerosis and a two-vessel coronary artery disease.  She had a recent echo that showed a normal ejection fraction.  She was seen in the emergency room 09/14/2017 after being found in atrial fibrillation with rapid rates.  Today, denies symptoms of palpitations, chest pain, shortness of breath, orthopnea, PND, lower extremity edema, claudication, dizziness, presyncope, syncope, bleeding, or neurologic sequela. The patient is tolerating medications without difficulties.  Overall she is feeling well.  She does have a few episodes of palpitations and fatigue.  She takes her pulse and noticed that she has fatigue when her heart rate is slow.  Otherwise she is feeling well without complaint.   Past Medical History:  Diagnosis Date  . Anxiety   . Arthritis   . Asthma   . Atrial fibrillation ([place])   . CHF (congestive heart failure) ([place])    pt. unsure- but thinks she was hosp. for CHF- 2002  . [facility]  . Clotting disorder ([place])    blood clots in lungs/PE pulmonary embolism  . Colon polyps   . Complication of anesthesia    states requires a lot med. to put her to sleep   . DDD (degenerative disc  disease) 09/17/2011  . Depression    "sometimes "  . DM (diabetes mellitus) ([place])   . Family history of anesthesia complication   . GERD (gastroesophageal reflux disease)   . Glaucoma    bilateral, pt. admits that she is noncompliant to eye gtts.   . Hemorrhoids   . Hyperlipidemia   . Hypertension    had stress, echo- 2006 /w [place], Cardiac Cath, per pt. 2002, echo repeated 2012- wnl   . Low back pain   . Shortness of breath   . Sleep apnea    uses c-pap- q night recently   Past Surgical History:  Procedure Laterality Date  . ABDOMINAL HYSTERECTOMY     ectopic, fibroids  . arm surgery Right   . CARDIAC CATHETERIZATION    . CATARACT EXTRACTION, BILATERAL     cataracts removed bilateral- ?IOL  . COLONOSCOPY     remote  . FISSURECTOMY  10/08/2011   Procedure: FISSURECTOMY;  Surgeon: Stark Klein, MD;  Location: [place];  Service: General;  Laterality: N/A;  . FLEXIBLE SIGMOIDOSCOPY  02/25/2011   Procedure: FLEXIBLE SIGMOIDOSCOPY;  Surgeon: Inda Castle, MD;  [facility];  Service: Endoscopy;  Laterality: N/A;  . FOOT SURGERY     bilat, heel spurs- screw in R foot   . HEMORRHOID SURGERY  10/08/2011   Procedure: HEMORRHOIDECTOMY;  Surgeon: Stark Klein, MD;  Location: [place];  Service: General;  Laterality: N/A;  External   . SPHINCTEROTOMY  10/08/2011  Procedure: SPHINCTEROTOMY;  Surgeon: Stark Klein, MD;  Location: [facility] [facility];  Service: General;  Laterality: N/A;     Current Outpatient Medications  Medication Sig Dispense Refill  . albuterol (PROVENTIL HFA;VENTOLIN HFA) 108 (90 Base) MCG/ACT inhaler Inhale 1 puff into the lungs every 6 (six) hours as needed.     . ALPRAZolam (XANAX) 0.25 MG tablet Take 1 tablet (0.25 mg total) by mouth 3 (three) times daily as needed for anxiety or sleep. 20 tablet 0  . amLODipine (NORVASC) 10 MG tablet TAKE 1 TABLET BY MOUTH ONCE DAILY 90 tablet 1  . atorvastatin (LIPITOR) 80 MG tablet Take 1 tablet (80 mg total) by mouth daily. 30  tablet 3  . cyclobenzaprine (FLEXERIL) 10 MG tablet Take 1 tablet (10 mg total) by mouth 3 (three) times daily as needed. 90 tablet 1  . diclofenac sodium (VOLTAREN) 1 % GEL Apply 2 g topically 4 (four) times daily. 1 Tube 1  . dronedarone (MULTAQ) 400 MG tablet Take 1 tablet (400 mg total) by mouth 2 (two) times daily with a meal. 60 tablet 3  . fluticasone (FLONASE) 50 MCG/ACT nasal spray Place 1 spray into both nostrils daily. 16 g 0  . furosemide (LASIX) 40 MG tablet Take 0.5 tablets (20 mg total) by mouth daily. 90 tablet 2  . HYDROcodone-acetaminophen (NORCO) 10-325 MG tablet Take 1 tablet by mouth every 6 (six) hours as needed for moderate pain. Refill 1 month 90 tablet 0  . lactulose (CONSTULOSE) 10 GM/15ML solution TAKE 30 MILLILITERS THREE TIMES DAILY BY  MOUTH AS NEEDED FOR MILD CONSTIPATION 473 mL 4  . losartan (COZAAR) 100 MG tablet Take 1 tablet (100 mg total) by mouth daily. 90 tablet 3  . metFORMIN (GLUCOPHAGE-XR) 500 MG 24 hr tablet Take 2 tablets (1,000 mg total) by mouth daily with breakfast. 180 tablet 3  . Pirfenidone (ESBRIET) 801 MG TABS Take 1 tablet by mouth 3 (three) times daily. 90 tablet 11  . rivaroxaban (XARELTO) 20 MG TABS tablet Take 1 tablet (20 mg total) by mouth daily with supper. 30 tablet 6   No current facility-administered medications for this visit.     Allergies:   Fish oil; Other; Penicillins; Pneumococcal vaccines; Nitrofurantoin macrocrystal; Aspirin; Bactrim [sulfamethoxazole-trimethoprim]; Ciprofloxacin; Ibuprofen; Influenza vaccines; Ivp dye [iodinated diagnostic agents]; Latex; Macrobid [nitrofurantoin monohydrate macrocrystals]; and Shellfish allergy   Social History:  The patient  reports that she has never smoked. She has never used smokeless tobacco. She reports that she does not drink alcohol or use drugs.   Family History:  The patient's family history includes Arthritis/Rheumatoid in her sister; Asthma in her sister; Breast cancer in her  mother and sister; COPD in her sister; Colon cancer in her brother and brother; Emphysema in her sister; Heart attack in her mother; Heart disease in her mother; Lung cancer in her sister.    ROS:  Please see the history of present illness.   Otherwise, review of systems is positive for appetite change, chest pain, leg swelling, shortness of breath, leg pain, palpitations, cough, snoring, abdominal pain, diarrhea, constipation, back pain, muscle pain, balance problems, headaches, easy bruising.   All other systems are reviewed and negative.   PHYSICAL EXAM: VS:  BP (!) 142/78   Pulse (!) 40   Ht 5\' 5"  (1.651 m)   Wt 193 lb (87.5 kg)   SpO2 97%   BMI 32.12 kg/m  , BMI Body mass index is 32.12 kg/m. GEN: Well nourished, well developed, in no  acute distress  HEENT: normal  Neck: no JVD, carotid bruits, or masses Cardiac: Bradycardic, regular; no murmurs, rubs, or gallops,no edema  Respiratory:  clear to auscultation bilaterally, normal work of breathing GI: soft, nontender, nondistended, + BS MS: no deformity or atrophy  Skin: warm and dry Neuro:  Strength and sensation are intact Psych: euthymic mood, full affect  EKG:  EKG is ordered today. Personal review of the ekg ordered shows sinus rhythm, rate 40  Recent Labs: 06/09/2017: ALT 16 09/14/2017: B Natriuretic Peptide 29.7; Magnesium 2.3; TSH 1.499 11/02/2017: Hemoglobin 14.6; Platelets 185 11/04/2017: BUN 6; Creatinine, Ser 0.66; Potassium 4.1; Sodium 142    Lipid Panel     Component Value Date/Time   CHOL 167 11/26/2017 0908   TRIG 75 11/26/2017 0908   HDL 61 11/26/2017 0908   CHOLHDL 2.7 11/26/2017 0908   CHOLHDL 4 06/01/2014 1145   VLDL 16.8 06/01/2014 1145   LDLCALC 91 11/26/2017 0908   LDLDIRECT 152.8 03/30/2011 0859     Wt Readings from Last 3 Encounters:  02/16/18 193 lb (87.5 kg)  02/02/18 196 lb 12.8 oz (89.3 kg)  01/07/18 195 lb 12.8 oz (88.8 kg)      Other studies Reviewed: Additional studies/ records  that were reviewed today include: TTE 10/19/17  Review of the above records today demonstrates:  - Left ventricle: The cavity size was normal. Systolic function was   normal. The estimated ejection fraction was in the range of 60%   to 65%. Wall motion was normal; there were no regional wall   motion abnormalities. Doppler parameters are consistent with   abnormal left ventricular relaxation (grade 1 diastolic   dysfunction). - Aortic valve: There was no significant regurgitation. - Mitral valve: There was trivial regurgitation. - Left atrium: The atrium was mildly dilated. - Right ventricle: The cavity size was mildly dilated. Wall   thickness was normal. - Tricuspid valve: There was trivial regurgitation. - Pulmonic valve: There was trivial regurgitation. - Inferior vena cava: The vessel was normal in size. The   respirophasic diameter changes were in the normal range (>= 50%),   consistent with normal central venous pressure.  Myoview 11/12/17  Nuclear stress EF: 66%.  There was no ST segment deviation noted during stress.  No T wave inversion was noted during stress.  The study is normal.  This is a low risk study.  The left ventricular ejection fraction is hyperdynamic (>65%).  ASSESSMENT AND PLAN:  1.  Paroxysmal atrial fibrillation: Currently on Xarelto, Multitak, and metoprolol.  She is having symptoms of bradycardia.  Her heart rate today is 40.  We Shadrack Brummitt thus stop her metoprolol.  She is having some mild symptoms from her atrial fibrillation, though she does feel like it is fairly well controlled.  No changes at this time.  I did offer her ablation should her atrial fibrillation symptoms worsen.  This patients CHA2DS2-VASc Score and unadjusted Ischemic Stroke Rate (% per year) is equal to 4.8 % stroke rate/year from a score of 4  Above score calculated as 1 point each if present [CHF, HTN, DM, Vascular=MI/PAD/Aortic Plaque, Age if 65-74, or Female] Above score  calculated as 2 points each if present [Age > 75, or Stroke/TIA/TE]  2.  Hypertension: Mildly elevated today but has been normal in the past.  No changes.  3.  Hyperlipidemia: Recently increased dose of Lipitor.  Goal LDL less than 70.  4.  Obstructive sleep apnea: CPAP compliance encouraged  5.  Coronary artery  disease: Found on CT scan 05/08/2016 with calcified LAD and circumflex plaques.  Low risk Myoview.  No changes.  Current medicines are reviewed at length with the patient today.   The patient does not have concerns regarding her medicines.  The following changes were made today: Stop Toprol-XL  Labs/ tests ordered today include:  Orders Placed This Encounter  Procedures  . EKG 12-Lead     Disposition:   FU with Taccara Bushnell 6 months  Signed, Waynetta Metheny Meredith Leeds, MD  02/16/2018 11:00 AM     [facility] [address] [place] [address] [phone] (office) [phone] (fax)

## 2018-02-16 NOTE — Patient Instructions (Signed)
Medication Instructions:  Your physician has recommended you make the following change in your medication:  1. STOP Metoprolol (Lopressor)  * If you need a refill on your cardiac medications before your next appointment, please call your pharmacy.   Labwork: None ordered *We will only notify you of abnormal results, otherwise continue current treatment plan.  Testing/Procedures: None ordered  Follow-Up: Your physician wants you to follow-up in: 6 months with Dr. Curt Bears.  You will receive a reminder letter in the mail two months in advance. If you don't receive a letter, please call our office to schedule the follow-up appointment.  *Please note that any paperwork needing to be filled out by the provider will need to be addressed at the front desk prior to seeing the provider. Please note that any FMLA, disability or other documents regarding health condition is subject to a $25.00 charge that must be received prior to completion of paperwork in the form of a money order or check.  Thank you for choosing [facility]!!   Trinidad Curet, RN [phone]

## 2018-02-28 ENCOUNTER — Telehealth: Payer: Self-pay | Admitting: Cardiology

## 2018-02-28 DIAGNOSIS — R001 Bradycardia, unspecified: Secondary | ICD-10-CM

## 2018-02-28 NOTE — Telephone Encounter (Signed)
New Message    STAT if HR is under 50 or over 120 (normal HR is 60-100 beats per minute)  1) What is your heart rate? 109  Do you have a log of your heart rate readings (document readings)? Not available at the time of call.  2) Do you have any other symptoms? A little dizziness and very fatigued    Patient states that normally her  HR states in the low 50's.

## 2018-03-02 ENCOUNTER — Other Ambulatory Visit: Payer: Self-pay | Admitting: Internal Medicine

## 2018-03-02 DIAGNOSIS — M19012 Primary osteoarthritis, left shoulder: Secondary | ICD-10-CM

## 2018-03-02 MED ORDER — HYDROCODONE-ACETAMINOPHEN 10-325 MG PO TABS: 1.0000 | ORAL_TABLET | Freq: Four times a day (QID) | ORAL | 0 refills | Status: DC | PRN

## 2018-03-02 NOTE — Telephone Encounter (Signed)
Requested medication (s) are due for refill today: yes  Requested medication (s) are on the active medication list: yes  Last refill:  02/02/18  Future visit scheduled: yes  Notes to clinic:  Not delegated    Requested Prescriptions  Pending Prescriptions Disp Refills   HYDROcodone-acetaminophen (NORCO) 10-325 MG tablet 90 tablet 0    Sig: Take 1 tablet by mouth every 6 (six) hours as needed for moderate pain. Refill 1 month     Not Delegated - Analgesics:  Opioid Agonist Combinations Failed - 03/02/2018 12:36 PM      Failed - This refill cannot be delegated      Failed - Urine Drug Screen completed in last 360 days.      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

## 2018-03-02 NOTE — Telephone Encounter (Signed)
Copied from [place] [id number]. Topic: Quick Communication - See Telephone Encounter >> Mar 02, 2018 12:28 PM Ivar Drape wrote: CRM for notification. See Telephone encounter for: 03/02/18. Patient would like a refill on her HYDROcodone-acetaminophen (NORCO) 10-325 MG tablet medication and have it sent to her preferred pharmacy [facility] on [address]

## 2018-03-03 ENCOUNTER — Telehealth: Payer: Self-pay | Admitting: Internal Medicine

## 2018-03-03 NOTE — Telephone Encounter (Signed)
Please route to Dr. [name]. He is back in the a.m.

## 2018-03-03 NOTE — Telephone Encounter (Signed)
Pt reports HRs 51-56 She is tired, fatigued and sluggish. Denies dizziness/syncope. Pt aware I will forward to Dr. Curt Bears for advisement and that it may be next week before return call.

## 2018-03-03 NOTE — Telephone Encounter (Signed)
Pt called asking about concerns with Esbriet-states she has been having issues with diarrhea/loose stools after taking her evening dose. Esbriet 801mg  TID.  Pt would like to know what to do. Tonya please advise. Thanks.

## 2018-03-04 NOTE — Telephone Encounter (Signed)
Hold Esbriet until Monday December 23 then resume at bid dosing for a week.  Then December 30 start back at 801 tid

## 2018-03-04 NOTE — Telephone Encounter (Signed)
Called and spoke with pt letting her know the recs stated per BQ. Pt expressed understanding. Nothing further needed.

## 2018-03-07 NOTE — Telephone Encounter (Signed)
Advised Dr. Curt Bears recommends a 48 hour holter to further evaluate low HRs.   Pt is agreeable to plan and understands office will call her to arrange test.  She also understands it may be a week/more, after turning monitor in, before she hears back from our office. Patient verbalized understanding and agreeable to plan.

## 2018-03-17 ENCOUNTER — Ambulatory Visit (INDEPENDENT_AMBULATORY_CARE_PROVIDER_SITE_OTHER): Payer: PRIVATE HEALTH INSURANCE

## 2018-03-17 DIAGNOSIS — R001 Bradycardia, unspecified: Secondary | ICD-10-CM | POA: Diagnosis not present

## 2018-03-18 ENCOUNTER — Other Ambulatory Visit: Payer: Self-pay | Admitting: Internal Medicine

## 2018-03-18 DIAGNOSIS — F419 Anxiety disorder, unspecified: Secondary | ICD-10-CM

## 2018-03-18 NOTE — Telephone Encounter (Signed)
Pt is aware may take up to 3 business day. Pt is out of med

## 2018-03-28 ENCOUNTER — Other Ambulatory Visit: Payer: Self-pay | Admitting: Internal Medicine

## 2018-03-28 DIAGNOSIS — M19012 Primary osteoarthritis, left shoulder: Secondary | ICD-10-CM

## 2018-03-28 NOTE — Telephone Encounter (Signed)
Copied from [place] [id number]. Topic: Quick Communication - Rx Refill/Question >> Mar 28, 2018 10:42 AM Reyne Dumas L wrote: Medication:  HYDROcodone-acetaminophen (NORCO) 10-325 MG tablet dronedarone (MULTAQ) 400 MG tablet  Has the patient contacted their pharmacy? Yes - states she was told she needs refills (Agent: If no, request that the patient contact the pharmacy for the refill.) (Agent: If yes, when and what did the pharmacy advise?)  Preferred Pharmacy (with phone number or street name): [facility] ([address] [place] [phone] (Phone) [phone] (Fax)  Agent: Please be advised that RX refills may take up to 3 business days. We ask that you follow-up with your pharmacy.

## 2018-03-29 MED ORDER — DRONEDARONE HCL 400 MG PO TABS: 400.0000 mg | ORAL_TABLET | Freq: Two times a day (BID) | ORAL | 3 refills | Status: DC

## 2018-03-29 MED ORDER — HYDROCODONE-ACETAMINOPHEN 10-325 MG PO TABS: 1.0000 | ORAL_TABLET | Freq: Four times a day (QID) | ORAL | 0 refills | Status: DC | PRN

## 2018-04-11 ENCOUNTER — Other Ambulatory Visit: Payer: Self-pay | Admitting: Pulmonary Disease

## 2018-04-19 NOTE — Progress Notes (Signed)
@Patient  ID: Miranda [name], female    DOB: 1945/09/10, 72 y.o.   MRN: [id number]  Chief Complaint  Patient presents with  . Follow-up    IPF follow-up    Referring provider: [name], [place]*  HPI:  73 year old female never smoker followed in our office for IPF  Smoker/ Smoking History: Never smoker Maintenance: Esbriet Pt of: Dr. [name]  Synopsis: Former patient of Dr. Gwenette Greet who was hospitalized in January 2017 with pneumonia and had findings on her CT chest which were worrisome for pulmonary fibrosis.In February 2018 she had a CT angiogram of her chest diagnosing a pulmonary embolism. This is felt to be idiopathic. A follow-up CT scan of the chest in March 2018 showed usual interstitial pneumonitis.  Serologic blood work showed no evidence of a connective tissue disease.   04/20/2018  - Visit   73 year old female never smoker presenting to our office today for follow-up visit for management of her IPF.  Patient has been managed on Esbriet.  Patient was last seen in our office in March/2019.  Patient was supposed to follow-up 3 months from now with lab work but she did not keep that appointment.  Patient reports that she has not had any respiratory changes since last office visit.  She continues to have occasional dyspnea with exertion.  She does also have some nausea with her S.  Especially if she does not take the Esbriet with a meal.  Patient reports that sometimes she gets nauseous as well as has diarrhea if she takes the [place] with just a boost drink.  Her previous primary care doctor has retired and she is established with a new provider at that office.  MMRC - Breathlessness Score 2 - on level ground, I walk slower than people of the same age because of breathlessness, or have to stop for breathe when walking to my own pace     Tests:   Sleep study: 10/2016 PSG> AHI 10.5   Labs: March 2018 LFT normal, aldolase normal, CRP slightly low, sedimentation rate  slightly elevated 58, hypersensitivity pneumonitis panel negative, ANA negative, anti-Jo 1 negative, rheumatoid factor negative, SSA negative, SSB negative, SCL 70 negative  PFT Pulmonary function testing March 2017 showed a ratio of 84%, FVC 1.96 L (77% predicted), total lung capacity 3.85 L (74% predicted), DLCO 14.32 (55% predicted) March 2018 pulmonary function test ratio normal, FVC 2.25 L 90% predicted, total lung capacity 3.54 L 67% predicted, residual volume 1.26 L 56% predicted, DLCO 14.26 55% predicted September 2018 ratio normal, FVC 2.05 L 82% predicted, total lung capacity 3.87 L 74% predicted, DLCO 15.38 60%   6-minute walk: September 2018 348 m, O2 saturation nadir 93% March 2019 336 m O2 saturation nadir 99%  Imaging: February 2018: CT - compared to April 2017 there is no significant change in the peripheral and basilar predominant honeycomb change, traction bronchiectasis with significant patches of normal pulmonary parenchyma. Consistent with usual interstitial pneumonitis.   FENO:  No results found for: NITRICOXIDE  PFT: PFT Results Latest Ref Rng & Units 06/07/2017 12/07/2016 05/27/2016 06/12/2015  FVC-Pre L 2.16 1.95 2.35 1.81  FVC-Predicted Pre % 87 78 94 71  FVC-Post L 2.21 2.05 2.25 1.96  FVC-Predicted Post % 89 82 90 77  Pre FEV1/FVC % % 84 86 83 85  Post FEV1/FCV % % 85 88 86 84  FEV1-Pre L 1.81 1.67 1.96 1.55  FEV1-Predicted Pre % 94 86 100 78  FEV1-Post L 1.88 1.81 1.93 1.65  DLCO UNC% % 57 60 55 55  DLCO COR %Predicted % 85 83 87 95  TLC L 3.68 3.87 3.54 3.85  TLC % Predicted % 70 74 67 74  RV % Predicted % 67 81 56 86    Imaging: No results found.    Specialty Problems      Pulmonary Problems   Asthma    Qualifier: Diagnosis of  By: Burnice Logan  MD, Doretha Sou   Overview:  Overview:  Qualifier: Diagnosis of  By: Burnice Logan  MD, Doretha Sou      Respiratory condition due to chemical fumes and vapors ([place])    Overview:  Overview:  Qualifier:  Diagnosis of  By: Burnice Logan  MD, Doretha Sou      Obstructive sleep apnea syndrome    NPSG 2005:  AHI 7/hr  Overview:  Overview:  NPSG 2005:  AHI 7/hr  Last Assessment & Plan:  The patient is now back on CPAP and feels that she is doing well with the device.  We still have to optimize her pressure, and we'll therefore used the automatic setting to accomplish this.  I've also encouraged her to continue working on weight loss. Care Plan:  At this point, will arrange for the patient's machine to be changed over to auto mode for 2 weeks to optimize their pressure.  I will review the downloaded data once sent by dme, and also evaluate for compliance, leaks, and residual osa.  I will call the patient and dme to discuss the results, and have the patient's machine set appropriately.  This will serve as the pt's cpap pressure titration.      UIP (usual interstitial pneumonitis) (HCC)    History of rheumatoid arthritis 06/2015 > complete resolution of the ground glass, but stable findings worrisome for UIP in bases of lungs, repeat high resolution CT chest recommended in 6-12 months      ILD (interstitial lung disease) (HCC)      Allergies  Allergen Reactions  . Fish Oil Anaphylaxis  . Other Hives, Shortness Of Breath and Swelling    Allergic to cashew nuts and peanut oil.  Marland Kitchen Penicillins Anaphylaxis, Hives and Swelling    Has patient had a PCN reaction causing immediate rash, facial/tongue/throat swelling, SOB or lightheadedness with hypotension: Face swelling and hives started first, then swelling of the throat  Has patient had a PCN reaction causing severe rash involving mucus membranes or skin necrosis: Yes  Has patient had a PCN reaction that required hospitalization: No  Has patient had a PCN reaction occurring within the last 10 years: Yes  If all of the above answers are "NO", then may proceed with Cephalospor  . Pneumococcal Vaccines Nausea And Vomiting  . Nitrofurantoin Macrocrystal  Hives  . Aspirin Itching and Rash  . Bactrim [Sulfamethoxazole-Trimethoprim] Hives, Itching and Rash  . Ciprofloxacin Hives, Itching and Rash  . Ibuprofen Rash  . Influenza Vaccines Hives  . Ivp Dye [Iodinated Diagnostic Agents] Hives, Itching and Rash    Gives benadryl to counteract symptoms  . Latex Rash  . Macrobid [Nitrofurantoin Monohydrate Macrocrystals] Hives  . Shellfish Allergy Hives    Patient also allergic to seafood    Immunization History  Administered Date(s) Administered  . PPD Test 11/19/2010   Pt reports allergy to Pneumonia and Flu vaccine   Past Medical History:  Diagnosis Date  . Anxiety   . Arthritis   . Asthma   . Atrial fibrillation ([place])   . CHF (congestive heart failure) (  [place])    pt. unsure- but thinks she was hosp. for CHF- 2002  . [facility] kidney disease    [facility]  . Clotting disorder ([place])    blood clots in lungs/PE pulmonary embolism  . Colon polyps   . Complication of anesthesia    states requires a lot med. to put her to sleep   . DDD (degenerative disc disease) 09/17/2011  . Depression    "sometimes "  . DM (diabetes mellitus) ([place])   . Family history of anesthesia complication   . GERD (gastroesophageal reflux disease)   . Glaucoma    bilateral, pt. admits that she is noncompliant to eye gtts.   . Hemorrhoids   . Hyperlipidemia   . Hypertension    had stress, echo- 2006 /w [place], Cardiac Cath, per pt. 2002, echo repeated 2012- wnl   . Low back pain   . Shortness of breath   . Sleep apnea    uses c-pap- q night recently    Tobacco History: Social History   Tobacco Use  Smoking Status Never Smoker  Smokeless Tobacco Never Used   Counseling given: Yes   Outpatient Encounter Medications as of 04/20/2018  Medication Sig  . albuterol (PROVENTIL HFA;VENTOLIN HFA) 108 (90 Base) MCG/ACT inhaler Inhale 1 puff into the lungs every 6 (six) hours as needed.   . ALPRAZolam (XANAX) 0.25 MG tablet TAKE 1 TABLET BY MOUTH  THREE TIMES DAILY AS NEEDED FOR ANXIETY OR  FOR SLEEP  . amLODipine (NORVASC) 10 MG tablet TAKE 1 TABLET BY MOUTH ONCE DAILY  . cyclobenzaprine (FLEXERIL) 10 MG tablet Take 1 tablet (10 mg total) by mouth 3 (three) times daily as needed.  . diclofenac sodium (VOLTAREN) 1 % GEL Apply 2 g topically 4 (four) times daily.  Marland Kitchen dronedarone (MULTAQ) 400 MG tablet Take 1 tablet (400 mg total) by mouth 2 (two) times daily with a meal.  . ESBRIET 801 MG TABS TAKE 1 TABLET BY MOUTH THREE TIMES A DAY WITH MEALS. PROTECT FROM SUNBURN. CALL [phone] TO REFILL  . furosemide (LASIX) 40 MG tablet Take 0.5 tablets (20 mg total) by mouth daily.  Marland Kitchen HYDROcodone-acetaminophen (NORCO) 10-325 MG tablet Take 1 tablet by mouth every 6 (six) hours as needed for moderate pain. Refill 1 month  . lactulose (CONSTULOSE) 10 GM/15ML solution TAKE 30 MILLILITERS THREE TIMES DAILY BY  MOUTH AS NEEDED FOR MILD CONSTIPATION  . losartan (COZAAR) 100 MG tablet Take 1 tablet (100 mg total) by mouth daily.  . metFORMIN (GLUCOPHAGE-XR) 500 MG 24 hr tablet Take 2 tablets (1,000 mg total) by mouth daily with breakfast.  . rivaroxaban (XARELTO) 20 MG TABS tablet Take 1 tablet (20 mg total) by mouth daily with supper.  Marland Kitchen atorvastatin (LIPITOR) 80 MG tablet Take 1 tablet (80 mg total) by mouth daily.  . fluticasone (FLONASE) 50 MCG/ACT nasal spray Place 1 spray into both nostrils daily. (Patient not taking: Reported on 04/20/2018)   No facility-administered encounter medications on file as of 04/20/2018.      Review of Systems  Review of Systems  Constitutional: Positive for fatigue (with exertion ). Negative for chills, fever and unexpected weight change.  HENT: Negative for congestion, ear pain, postnasal drip, sinus pressure and sinus pain.   Respiratory: Positive for shortness of breath (with exertion ). Negative for cough, chest tightness and wheezing.   Cardiovascular: Negative for chest pain and palpitations.  Gastrointestinal:  Positive for diarrhea and nausea. Negative for blood in stool and  vomiting.  Genitourinary: Positive for urgency. Negative for frequency and hematuria.  Skin: Negative for color change.  Allergic/Immunologic: Negative for environmental allergies and food allergies.  Neurological: Negative for dizziness, light-headedness and headaches.  Psychiatric/Behavioral: Negative for dysphoric mood. The patient is not nervous/anxious.   All other systems reviewed and are negative.    Physical Exam  BP 118/62 (BP Location: Left Arm, Cuff Size: Normal)   Pulse (!) 58   Ht 5\' 5"  (1.651 m)   Wt 187 lb (84.8 kg)   SpO2 98%   BMI 31.12 kg/m   Wt Readings from Last 5 Encounters:  04/20/18 187 lb (84.8 kg)  02/16/18 193 lb (87.5 kg)  02/02/18 196 lb 12.8 oz (89.3 kg)  01/07/18 195 lb 12.8 oz (88.8 kg)  12/01/17 199 lb (90.3 kg)     Physical Exam  Constitutional: She is oriented to person, place, and time and well-developed, well-nourished, and in no distress. No distress.  HENT:  Head: Normocephalic and atraumatic.  [name] Ear: Hearing, tympanic membrane, external ear and ear canal normal.  Left Ear: Hearing, tympanic membrane, external ear and ear canal normal.  Nose: Nose normal. [name] sinus exhibits no maxillary sinus tenderness and no frontal sinus tenderness. Left sinus exhibits no maxillary sinus tenderness and no frontal sinus tenderness.  Mouth/Throat: Uvula is midline and oropharynx is clear and moist. No oropharyngeal exudate.  Eyes: Pupils are equal, round, and reactive to light.  Neck: Normal range of motion. Neck supple. No JVD present.  Cardiovascular: Normal rate, regular rhythm and normal heart sounds.  Pulmonary/Chest: Effort normal and breath sounds normal. No accessory muscle usage. No respiratory distress. She has no decreased breath sounds. She has no wheezes. She has no rhonchi.  Bibasilar crackles  Musculoskeletal: Normal range of motion.        General: No edema.    Lymphadenopathy:    She has no cervical adenopathy.  Neurological: She is alert and oriented to person, place, and time. Gait normal.  Skin: Skin is warm and dry. She is not diaphoretic. No erythema.  Psychiatric: Mood, memory, affect and judgment normal.  Nursing note and vitals reviewed.   SIX MIN WALK 04/20/2018 06/09/2017 12/07/2016  Medications - amlodipine 10mg , flonase 20mcg, furosemide 40mg , losartan 100mg , metformin 500mg , metoprolol 25mg , Esbriet 801mg , prednisone 20mg  all taken around 9:30 Lasix 40 mg, Cozaar 50mg , Lovastatin 40mg , Metoprolol 25mg , Pirfenidone 801 mg all at 9:30, Xarelto 20mg   10:00am  Supplimental Oxygen during Test? (L/min) No No No  Laps - 7 7  Partial Lap (in Meters) - 0 12  Baseline BP (sitting) - 120/62 130/64  Baseline Heartrate - 52 58  Baseline Dyspnea (Borg Scale) - 2 1  Baseline Fatigue (Borg Scale) - 2 4  Baseline SPO2 - 100 100  BP (sitting) - 140/64 140/60  Heartrate - 68 65  Dyspnea (Borg Scale) - 4 7  Fatigue (Borg Scale) - 3 7  SPO2 - 99 93  BP (sitting) - 130/64 126/62  Heartrate - 58 53  SPO2 - 100 98  Stopped or Paused before Six Minutes - No No  Interpretation - Leg pain -  Distance Completed - 336 348  Tech Comments: Patient was able to complete 2 laps without O2. After the first lap, she had SOB. Denied any signs of chest pain or leg pain. O2 was not needed during or after walk.  Pt walked at a normal pace without any stopping and did not state any complaints or concerns during walk.  Pt caomplained of beinf really fatigued during the walk TA/CMA     Lab Results:  CBC    Component Value Date/Time   WBC 6.8 11/02/2017 2304   RBC 4.79 11/02/2017 2304   HGB 14.6 11/02/2017 2329   HCT 43.0 11/02/2017 2329   PLT 185 11/02/2017 2304   MCV 88.9 11/02/2017 2304   MCH 27.6 11/02/2017 2304   MCHC 31.0 11/02/2017 2304   RDW 14.1 11/02/2017 2304   LYMPHSABS 2.7 11/02/2017 2304   MONOABS 0.6 11/02/2017 2304   EOSABS 0.2 11/02/2017  2304   BASOSABS 0.0 11/02/2017 2304    BMET    Component Value Date/Time   NA 142 11/04/2017 0410   K 4.1 11/04/2017 0410   CL 108 11/04/2017 0410   CO2 26 11/04/2017 0410   GLUCOSE 86 11/04/2017 0410   BUN 6 (L) 11/04/2017 0410   CREATININE 0.66 11/04/2017 0410   CALCIUM 8.7 (L) 11/04/2017 0410   GFRNONAA >60 11/04/2017 0410   GFRAA >60 11/04/2017 0410    BNP    Component Value Date/Time   BNP 29.7 09/14/2017 1556    ProBNP    Component Value Date/Time   PROBNP 61.6 08/30/2010 0710      Assessment & Plan:     ILD (interstitial lung disease) ([place]) Assessment: UIP pattern on on CT Managed on Esbriet Walk in office today patient did not have any oxygen desaturations and completed 2 laps on room air Bibasilar crackles mMRC 2  Plan: Continue Esbriet therapy as prescribed Lab work today to monitor liver function Follow-up with Dr. [name] in 2 to 3 months     Therapeutic drug monitoring Assessment: On Esbriet  Plan: We will order lab work today  Paroxysmal atrial fibrillation [facility]) Assessment: Patient reports she had a recent diagnosis of A. Fib in 2019 Heart rate appears stable today Bradycardia regular rhythm  Plan: Continue follow-up with primary care Continue [place], NP 04/20/2018   This appointment was 28 min long with over 50% of the time in direct face-to-face patient care, assessment, plan of care, and follow-up.

## 2018-04-20 ENCOUNTER — Ambulatory Visit (INDEPENDENT_AMBULATORY_CARE_PROVIDER_SITE_OTHER): Payer: PRIVATE HEALTH INSURANCE | Admitting: Pulmonary Disease

## 2018-04-20 ENCOUNTER — Encounter: Payer: Self-pay | Admitting: Pulmonary Disease

## 2018-04-20 VITALS — BP 118/62 | HR 58 | Ht 65.0 in | Wt 187.0 lb

## 2018-04-20 DIAGNOSIS — I480 Paroxysmal atrial fibrillation: Secondary | ICD-10-CM

## 2018-04-20 DIAGNOSIS — J849 Interstitial pulmonary disease, unspecified: Secondary | ICD-10-CM

## 2018-04-20 DIAGNOSIS — Z5181 Encounter for therapeutic drug level monitoring: Secondary | ICD-10-CM | POA: Diagnosis not present

## 2018-04-20 LAB — COMPREHENSIVE METABOLIC PANEL
ALT: 22 U/L (ref 0–35)
AST: 18 U/L (ref 0–37)
Albumin: 4.4 g/dL (ref 3.5–5.2)
Alkaline Phosphatase: 44 U/L (ref 39–117)
BUN: 12 mg/dL (ref 6–23)
CO2: 29 mEq/L (ref 19–32)
Calcium: 9.6 mg/dL (ref 8.4–10.5)
Chloride: 103 mEq/L (ref 96–112)
Creatinine, Ser: 0.82 mg/dL (ref 0.40–1.20)
GFR: 83.02 mL/min (ref >60.00)
Glucose, Bld: 80 mg/dL (ref 70–99)
Potassium: 4.5 mEq/L (ref 3.5–5.1)
Sodium: 140 mEq/L (ref 135–145)
Total Bilirubin: 0.6 mg/dL (ref 0.2–1.2)
Total Protein: 7.7 g/dL (ref 6.0–8.3)

## 2018-04-20 LAB — HEPATIC FUNCTION PANEL
ALK PHOS: 44 U/L (ref 39–117)
ALT: 22 U/L (ref 0–35)
AST: 18 U/L (ref 0–37)
Albumin: 4.4 g/dL (ref 3.5–5.2)
Bilirubin, Direct: 0.1 mg/dL (ref 0.0–0.3)
Total Bilirubin: 0.6 mg/dL (ref 0.2–1.2)
Total Protein: 7.7 g/dL (ref 6.0–8.3)

## 2018-04-20 NOTE — Assessment & Plan Note (Signed)
Assessment: Patient reports she had a recent diagnosis of A. Fib in 2019 Heart rate appears stable today Bradycardia regular rhythm  Plan: Continue follow-up with primary care Continue Xarelto

## 2018-04-20 NOTE — Patient Instructions (Addendum)
Walk today in office on Room Air   Labwork today  >>>Liver Function and CMET   Continue Esbriet three times daily   Continue xarelto as prescribed   We recommend that you continue using your CPAP daily >>>Keep up the hard work using your device >>> Goal should be wearing this for the entire night that you are sleeping, at least 4 to 6 hours  Remember:  . Do not drive or operate heavy machinery if tired or drowsy.  . Please notify the supply company and office if you are unable to use your device regularly due to missing supplies or machine being broken.  . Work on maintaining a healthy weight and following your recommended nutrition plan  . Maintain proper daily exercise and movement  . Maintaining proper use of your device can also help improve management of other chronic illnesses such as: Blood pressure, blood sugars, and weight management.   BiPAP/ CPAP Cleaning:  >>>Clean weekly, with Dawn soap, and bottle brush.  Set up to air dry.   Follow up with ONLY Dr. [name] in 2-3 months, if nothing available then first available  >>>any day other than Tuesday       It is flu season:   >>>Remember to be washing your hands regularly, using hand sanitizer, be careful to use around herself with has contact with people who are sick will increase her chances of getting sick yourself. >>> Best ways to protect herself from the flu: Receive the yearly flu vaccine, practice good hand hygiene washing with soap and also using hand sanitizer when available, eat a nutritious meals, get adequate rest, hydrate appropriately   Please contact the office if your symptoms worsen or you have concerns that you are not improving.   Thank you for choosing [facility] for your healthcare, and for allowing [place] to partner with you on your healthcare journey. I am thankful to be able to provide care to you today.   Wyn Quaker FNP-C

## 2018-04-20 NOTE — Assessment & Plan Note (Signed)
Assessment: UIP pattern on on CT Managed on Esbriet Walk in office today patient did not have any oxygen desaturations and completed 2 laps on room air Bibasilar crackles mMRC 2  Plan: Continue Esbriet therapy as prescribed Lab work today to monitor liver function Follow-up with Dr. [name] in 2 to 3 months

## 2018-04-20 NOTE — Assessment & Plan Note (Signed)
Assessment: On Esbriet  Plan: We will order lab work today

## 2018-04-20 NOTE — Progress Notes (Signed)
Lab work is stable. No changes.   Wyn Quaker FNP

## 2018-04-20 NOTE — Progress Notes (Signed)
Reviewed, agree 

## 2018-04-25 ENCOUNTER — Other Ambulatory Visit: Payer: Self-pay | Admitting: Internal Medicine

## 2018-04-25 DIAGNOSIS — F419 Anxiety disorder, unspecified: Secondary | ICD-10-CM

## 2018-04-25 NOTE — Telephone Encounter (Signed)
Last rx given on 03/18/2018 for #20 with no ref

## 2018-04-26 ENCOUNTER — Other Ambulatory Visit: Payer: Self-pay | Admitting: Internal Medicine

## 2018-04-26 DIAGNOSIS — M19012 Primary osteoarthritis, left shoulder: Secondary | ICD-10-CM

## 2018-04-26 MED ORDER — RIVAROXABAN 20 MG PO TABS: 20.0000 mg | ORAL_TABLET | Freq: Every day | ORAL | 3 refills | Status: DC

## 2018-04-26 NOTE — Telephone Encounter (Signed)
Copied from [place] [id number]. Topic: Quick Communication - Rx Refill/Question >> Apr 26, 2018 11:50 AM Alanda Slim E wrote: Medication: rivaroxaban (XARELTO) 20 MG TABS tablet  HYDROcodone-acetaminophen (NORCO) 10-325 MG tablet   Has the patient contacted their pharmacy? no  Preferred Pharmacy (with phone number or street name): [facility] ([address] [place] [phone] (Phone) [phone] (Fax)    Agent: Please be advised that RX refills may take up to 3 business days. We ask that you follow-up with your pharmacy.

## 2018-04-26 NOTE — Telephone Encounter (Signed)
Requested Prescriptions  Pending Prescriptions Disp Refills  . rivaroxaban (XARELTO) 20 MG TABS tablet 90 tablet 3    Sig: Take 1 tablet (20 mg total) by mouth daily with supper.     Hematology: Anticoagulants - rivaroxaban Passed - 04/26/2018 11:55 AM      Passed - ALT in normal range and within 180 days    ALT  Date Value Ref Range Status  04/20/2018 22 0 - 35 U/L Final  04/20/2018 22 0 - 35 U/L Final         Passed - AST in normal range and within 180 days    AST  Date Value Ref Range Status  04/20/2018 18 0 - 37 U/L Final  04/20/2018 18 0 - 37 U/L Final         Passed - Cr in normal range and within 360 days    Creatinine, Ser  Date Value Ref Range Status  04/20/2018 0.82 0.40 - 1.20 mg/dL Final         Passed - HCT in normal range and within 360 days    HCT  Date Value Ref Range Status  11/02/2017 43.0 36.0 - 46.0 % Final         Passed - HGB in normal range and within 360 days    Hemoglobin  Date Value Ref Range Status  11/02/2017 14.6 12.0 - 15.0 g/dL Final         Passed - PLT in normal range and within 360 days    Platelets  Date Value Ref Range Status  11/02/2017 185 150 - 400 K/uL Final         Passed - Valid encounter within last 12 months    Recent Outpatient Visits          2 months ago Dyslipidemia   [facility]rap[facility], [facility] at [facility], [name], MD   4 months ago Diabetes mellitus without complication ([place])   [place] [facility], Doretha Sou, MD   6 months ago Chronic low back pain without sciatica, unspecified back pain laterality   Therap[facility], [facility] at [facility], Doretha Sou, MD   7 months ago Diabetes mellitus without complication ([place])   [place] at [facility], Doretha Sou, MD   8 months ago Encounter for therapeutic drug monitoring   Therapist, music at [facility], Doretha Sou, MD      Future Appointments            In 1 week [name], [name], MD [place] at [place], [place]   In 2 months Juanito Doom, MD [facility]         . HYDROcodone-acetaminophen (NORCO) 10-325 MG tablet 90 tablet 0    Sig: Take 1 tablet by mouth every 6 (six) hours as needed for moderate pain. Refill 1 month     Not Delegated - Analgesics:  Opioid Agonist Combinations Failed - 04/26/2018 11:55 AM      Failed - This refill cannot be delegated      Failed - Urine Drug Screen completed in last 360 days.      Passed - Valid encounter within last 6 months    Recent Outpatient Visits          2 months ago Dyslipidemia   [place] at [facility], [name], MD   4 months ago Diabetes mellitus without complication [facility])   [place] at [facility], Doretha Sou, MD   6 months  ago Chronic low back pain without sciatica, unspecified back pain laterality   Therapist, music at [facility], Doretha Sou, MD   7 months ago Diabetes mellitus without complication [facility])   [place] at [facility], Doretha Sou, MD   8 months ago Encounter for therapeutic drug Glass blower/designer at [facility], Doretha Sou, MD      Future Appointments            In 1 week [name], [name], MD [place] at [place], [place]   In 2 months Juanito Doom, MD [facility]

## 2018-04-26 NOTE — Telephone Encounter (Signed)
Requested medication (s) are due for refill today:  yes  Requested medication (s) are on the active medication list:  yes  Future visit scheduled:  yes  Last Refill: 03/29/18; #90; no refills  Requested Prescriptions  Pending Prescriptions Disp Refills   HYDROcodone-acetaminophen (NORCO) 10-325 MG tablet 90 tablet 0    Sig: Take 1 tablet by mouth every 6 (six) hours as needed for moderate pain. Refill 1 month     Not Delegated - Analgesics:  Opioid Agonist Combinations Failed - 04/26/2018 11:55 AM      Failed - This refill cannot be delegated      Failed - Urine Drug Screen completed in last 360 days.      Passed - Valid encounter within last 6 months    Recent Outpatient Visits          2 months ago Dyslipidemia   Therapist, music at [facility], [name], MD   4 months ago Diabetes mellitus without complication [facility])   [place] [facility], Doretha Sou, MD   6 months ago Chronic low back pain without sciatica, unspecified back pain laterality   Therapist, [facility] at [facility], Doretha Sou, MD   7 months ago Diabetes mellitus without complication ([place])   [place] at [facility], Doretha Sou, MD   8 months ago Encounter for therapeutic drug Glass blower/designer at [facility] [facility], Doretha Sou, MD      Future Appointments            In 1 week [name], [name], MD [place] at [place], [place]   In 2 months Juanito Doom, MD [facility]         Signed Prescriptions Disp Refills   rivaroxaban (XARELTO) 20 MG TABS tablet 90 tablet 3    Sig: Take 1 tablet (20 mg total) by mouth daily with supper.     Hematology: Anticoagulants - rivaroxaban Passed - 04/26/2018 11:55 AM      Passed - ALT in normal range and within 180 days    ALT  Date Value Ref Range Status  04/20/2018 22 0 - 35 U/L Final  04/20/2018 22 0 - 35 U/L Final         Passed - AST in normal range and  within 180 days    AST  Date Value Ref Range Status  04/20/2018 18 0 - 37 U/L Final  04/20/2018 18 0 - 37 U/L Final         Passed - Cr in normal range and within 360 days    Creatinine, Ser  Date Value Ref Range Status  04/20/2018 0.82 0.40 - 1.20 mg/dL Final         Passed - HCT in normal range and within 360 days    HCT  Date Value Ref Range Status  11/02/2017 43.0 36.0 - 46.0 % Final         Passed - HGB in normal range and within 360 days    Hemoglobin  Date Value Ref Range Status  11/02/2017 14.6 12.0 - 15.0 g/dL Final         Passed - PLT in normal range and within 360 days    Platelets  Date Value Ref Range Status  11/02/2017 185 150 - 400 K/uL Final         Passed - Valid encounter within last 12 months    Recent Outpatient Visits  2 months ago Dyslipidemia   Therapist, music at [facility], [name], MD   4 months ago Diabetes mellitus without complication [facility])   [place] [facility], Doretha Sou, MD   6 months ago Chronic low back pain without sciatica, unspecified back pain laterality   Therapist, music at [facility], Doretha Sou, MD   7 months ago Diabetes mellitus without complication [facility]   [place] at [facility], Doretha Sou, MD   8 months ago Encounter for therapeutic drug Glass blower/designer at [facility], Doretha Sou, MD      Future Appointments            In 1 week [name], [name], MD [place] at [place], [place]   In 2 months Juanito Doom, MD [facility]

## 2018-04-27 NOTE — Telephone Encounter (Signed)
Spoke with patient and an appointment scheduled for 04/29/2018.  Refill will be given then.

## 2018-04-29 ENCOUNTER — Ambulatory Visit (INDEPENDENT_AMBULATORY_CARE_PROVIDER_SITE_OTHER): Payer: HMO | Admitting: Internal Medicine

## 2018-04-29 ENCOUNTER — Encounter: Payer: Self-pay | Admitting: Internal Medicine

## 2018-04-29 VITALS — BP 130/60 | HR 60 | Temp 98.6°F | Wt 189.6 lb

## 2018-04-29 DIAGNOSIS — E785 Hyperlipidemia, unspecified: Secondary | ICD-10-CM | POA: Diagnosis not present

## 2018-04-29 DIAGNOSIS — E1169 Type 2 diabetes mellitus with other specified complication: Secondary | ICD-10-CM

## 2018-04-29 DIAGNOSIS — E782 Mixed hyperlipidemia: Secondary | ICD-10-CM

## 2018-04-29 DIAGNOSIS — I480 Paroxysmal atrial fibrillation: Secondary | ICD-10-CM | POA: Diagnosis not present

## 2018-04-29 DIAGNOSIS — F419 Anxiety disorder, unspecified: Secondary | ICD-10-CM | POA: Diagnosis not present

## 2018-04-29 DIAGNOSIS — M19012 Primary osteoarthritis, left shoulder: Secondary | ICD-10-CM | POA: Diagnosis not present

## 2018-04-29 DIAGNOSIS — M545 Low back pain, unspecified: Secondary | ICD-10-CM

## 2018-04-29 DIAGNOSIS — G8929 Other chronic pain: Secondary | ICD-10-CM

## 2018-04-29 DIAGNOSIS — I10 Essential (primary) hypertension: Secondary | ICD-10-CM

## 2018-04-29 MED ORDER — HYDROCODONE-ACETAMINOPHEN 10-325 MG PO TABS: 1.0000 | ORAL_TABLET | Freq: Four times a day (QID) | ORAL | 0 refills | Status: DC | PRN

## 2018-04-29 MED ORDER — RIVAROXABAN 20 MG PO TABS: 20.0000 mg | ORAL_TABLET | Freq: Every day | ORAL | 1 refills | Status: DC

## 2018-04-29 MED ORDER — DRONEDARONE HCL 400 MG PO TABS: 400.0000 mg | ORAL_TABLET | Freq: Two times a day (BID) | ORAL | 3 refills | Status: DC

## 2018-04-29 MED ORDER — ALPRAZOLAM 0.25 MG PO TABS: ORAL_TABLET | ORAL | 2 refills | Status: DC

## 2018-04-29 MED ORDER — ATORVASTATIN CALCIUM 80 MG PO TABS: 80.0000 mg | ORAL_TABLET | Freq: Every day | ORAL | 1 refills | Status: DC

## 2018-04-29 NOTE — Patient Instructions (Signed)
-  Nice seeing you today!!  -Schedule follow up in 3 months. 

## 2018-04-29 NOTE — Progress Notes (Signed)
Established Patient Office Visit     CC/Reason for Visit: Medication refills, follow-up on chronic medical conditions  HPI: Miranda [name] is a 73 y.o. female who is coming in today for above mentioned reasons. Past Medical History is significan near t for:   Atrial fibrillation chronically anticoagulated on Xarelto on rhythm therapy with Multaq and rate controlled on metoprolol, benign essential hypertension that has been well controlled, prior history of PE, interstitial lung disease, GERD, type 2 diabetes, hyperlipidemia that is uncontrolled and statin was recently increased.  She has also been complaining of left shoulder pain (chronic issue) and saw orthopedics, Dr. Marlou Sa, who recommended continued physical therapy and PRN injections.  History of anxiety on Xanax 0.25 mg for which she takes 1 tablet approximately 3 times a week for sleep issues. She has no acute complaints this visit.  She is here for pain management refill.for the above mentioned reasons.   Past Medical/Surgical History: Past Medical History:  Diagnosis Date  . Anxiety   . Arthritis   . Asthma   . Atrial fibrillation ([place])   . CHF (congestive heart failure) ([place])    pt. unsure- but thinks she was hosp. for CHF- 2002  . Chronic kidney disease    [facility]  . Clotting disorder ([place])    blood clots in lungs/PE pulmonary embolism  . Colon polyps   . Complication of anesthesia    states requires a lot med. to put her to sleep   . DDD (degenerative disc disease) 09/17/2011  . Depression    "sometimes "  . DM (diabetes mellitus) ([place])   . Family history of anesthesia complication   . GERD (gastroesophageal reflux disease)   . Glaucoma    bilateral, pt. admits that she is noncompliant to eye gtts.   . Hemorrhoids   . Hyperlipidemia   . Hypertension    had stress, echo- 2006 /w [place], Cardiac Cath, per pt. 2002, echo repeated 2012- wnl   . Low back pain   . Shortness of breath   . Sleep apnea     uses c-pap- q night recently    Past Surgical History:  Procedure Laterality Date  . ABDOMINAL HYSTERECTOMY     ectopic, fibroids  . arm surgery Right   . CARDIAC CATHETERIZATION    . CATARACT EXTRACTION, BILATERAL     cataracts removed bilateral- ?IOL  . COLONOSCOPY     remote  . FISSURECTOMY  10/08/2011   Procedure: FISSURECTOMY;  Surgeon: Stark Klein, MD;  Location: [place];  Service: General;  Laterality: N/A;  . FLEXIBLE SIGMOIDOSCOPY  02/25/2011   Procedure: FLEXIBLE SIGMOIDOSCOPY;  Surgeon: Inda Castle, MD;  Location: [facility];  Service: Endoscopy;  Laterality: N/A;  . FOOT SURGERY     bilat, heel spurs- screw in R foot   . HEMORRHOID SURGERY  10/08/2011   Procedure: HEMORRHOIDECTOMY;  Surgeon: Stark Klein, MD;  Location: [place];  Service: General;  Laterality: N/A;  External   . SPHINCTEROTOMY  10/08/2011   Procedure: Joan Mayans;  Surgeon: Stark Klein, MD;  Location: [employer];  Service: General;  Laterality: N/A;    Social History:  reports that she has never smoked. She has never used smokeless tobacco. She reports that she does not drink alcohol or use drugs.  Allergies: Allergies  Allergen Reactions  . Fish Oil Anaphylaxis  . Other Hives, Shortness Of Breath and Swelling    Allergic to cashew nuts and peanut oil.  Marland Kitchen  Penicillins Anaphylaxis, Hives and Swelling    Has patient had a PCN reaction causing immediate rash, facial/tongue/throat swelling, SOB or lightheadedness with hypotension: Face swelling and hives started first, then swelling of the throat  Has patient had a PCN reaction causing severe rash involving mucus membranes or skin necrosis: Yes  Has patient had a PCN reaction that required hospitalization: No  Has patient had a PCN reaction occurring within the last 10 years: Yes  If all of the above answers are "NO", then may proceed with Cephalospor  . Pneumococcal Vaccines Nausea And Vomiting  . Nitrofurantoin Macrocrystal Hives  . Aspirin  Itching and Rash  . Bactrim [Sulfamethoxazole-Trimethoprim] Hives, Itching and Rash  . Ciprofloxacin Hives, Itching and Rash  . Ibuprofen Rash  . Influenza Vaccines Hives  . Ivp Dye [Iodinated Diagnostic Agents] Hives, Itching and Rash    Gives benadryl to counteract symptoms  . Latex Rash  . Macrobid [Nitrofurantoin Monohydrate Macrocrystals] Hives  . Shellfish Allergy Hives    Patient also allergic to seafood    Family History:  Family History  Problem Relation Age of Onset  . Heart attack Mother   . Heart disease Mother   . Breast cancer Mother   . Emphysema Sister   . Breast cancer Sister   . Arthritis/Rheumatoid Sister   . Asthma Sister   . Lung cancer Sister   . COPD Sister   . Colon cancer Brother   . Colon cancer Brother   . Anesthesia problems Neg Hx      Current Outpatient Medications:  .  albuterol (PROVENTIL HFA;VENTOLIN HFA) 108 (90 Base) MCG/ACT inhaler, Inhale 1 puff into the lungs every 6 (six) hours as needed. , Disp: , Rfl:  .  ALPRAZolam (XANAX) 0.25 MG tablet, TAKE 1 TABLET BY MOUTH THREE TIMES DAILY AS NEEDED FOR ANXIETY FOR SLEEP, Disp: 15 tablet, Rfl: 2 .  amLODipine (NORVASC) 10 MG tablet, TAKE 1 TABLET BY MOUTH ONCE DAILY, Disp: 90 tablet, Rfl: 1 .  atorvastatin (LIPITOR) 80 MG tablet, Take 1 tablet (80 mg total) by mouth daily., Disp: 90 tablet, Rfl: 1 .  cyclobenzaprine (FLEXERIL) 10 MG tablet, Take 1 tablet (10 mg total) by mouth 3 (three) times daily as needed., Disp: 90 tablet, Rfl: 1 .  diclofenac sodium (VOLTAREN) 1 % GEL, Apply 2 g topically 4 (four) times daily., Disp: 1 Tube, Rfl: 1 .  dronedarone (MULTAQ) 400 MG tablet, Take 1 tablet (400 mg total) by mouth 2 (two) times daily with a meal., Disp: 60 tablet, Rfl: 3 .  ESBRIET 801 MG TABS, TAKE 1 TABLET BY MOUTH THREE TIMES A DAY WITH MEALS. PROTECT FROM SUNBURN. CALL [phone] TO REFILL, Disp: 90 tablet, Rfl: 11 .  fluticasone (FLONASE) 50 MCG/ACT nasal spray, Place 1 spray into both  nostrils daily. (Patient not taking: Reported on 04/20/2018), Disp: 16 g, Rfl: 0 .  furosemide (LASIX) 40 MG tablet, Take 0.5 tablets (20 mg total) by mouth daily., Disp: 90 tablet, Rfl: 2 .  HYDROcodone-acetaminophen (NORCO) 10-325 MG tablet, Take 1 tablet by mouth every 6 (six) hours as needed., Disp: 90 tablet, Rfl: 0 .  HYDROcodone-acetaminophen (NORCO) 10-325 MG tablet, Take 1 tablet by mouth every 6 (six) hours as needed., Disp: 90 tablet, Rfl: 0 .  HYDROcodone-acetaminophen (NORCO) 10-325 MG tablet, Take 1 tablet by mouth every 6 (six) hours as needed for moderate pain. Refill 1 month, Disp: 90 tablet, Rfl: 0 .  lactulose (CONSTULOSE) 10 GM/15ML solution, TAKE 30 MILLILITERS THREE TIMES  DAILY BY  MOUTH AS NEEDED FOR MILD CONSTIPATION, Disp: 473 mL, Rfl: 4 .  losartan (COZAAR) 100 MG tablet, Take 1 tablet (100 mg total) by mouth daily., Disp: 90 tablet, Rfl: 3 .  metFORMIN (GLUCOPHAGE-XR) 500 MG 24 hr tablet, Take 2 tablets (1,000 mg total) by mouth daily with breakfast., Disp: 180 tablet, Rfl: 3 .  rivaroxaban (XARELTO) 20 MG TABS tablet, Take 1 tablet (20 mg total) by mouth daily with supper., Disp: 90 tablet, Rfl: 1  Review of Systems:  Constitutional: Denies fever, chills, diaphoresis, appetite change and fatigue.  HEENT: Denies photophobia, eye pain, redness, hearing loss, ear pain, congestion, sore throat, rhinorrhea, sneezing, mouth sores, trouble swallowing, neck pain, neck stiffness and tinnitus.   Respiratory: Denies SOB, DOE, cough, chest tightness,  and wheezing.   Cardiovascular: Denies chest pain, palpitations and leg swelling.  Gastrointestinal: Denies nausea, vomiting, abdominal pain, diarrhea, constipation, blood in stool and abdominal distention.  Genitourinary: Denies dysuria, urgency, frequency, hematuria, flank pain and difficulty urinating.  Endocrine: Denies: hot or cold intolerance, sweats, changes in hair or nails, polyuria, polydipsia. Musculoskeletal: Denies myalgias,  joint swelling, arthralgias and gait problem.  Skin: Denies pallor, rash and wound.  Neurological: Denies dizziness, seizures, syncope, weakness, light-headedness, numbness and headaches.  Hematological: Denies adenopathy. Easy bruising, personal or family bleeding history  Psychiatric/Behavioral: Denies suicidal ideation, mood changes, confusion, nervousness, sleep disturbance and agitation    Physical Exam: Vitals:   04/29/18 1009  BP: 130/60  Pulse: 60  Temp: 98.6 F (37 C)  TempSrc: Oral  SpO2: 97%  Weight: 189 lb 9.6 oz (86 kg)    Body mass index is 31.55 kg/m.   Constitutional: NAD, calm, comfortable Eyes: PERRL, lids and conjunctivae normal ENMT: Mucous membranes are moist. Neck: normal, supple, no masses, no thyromegaly Respiratory: clear to auscultation bilaterally, no wheezing, no crackles. Normal respiratory effort. No accessory muscle use.  Cardiovascular: Regular rate and rhythm, no murmurs / rubs / gallops. No extremity edema. 2+ pedal pulses. No carotid bruits.  Musculoskeletal: no clubbing / cyanosis. No joint deformity upper and lower extremities. Good ROM, no contractures. Normal muscle tone.  Psychiatric: Normal judgment and insight. Alert and oriented x 3. Normal mood.    Impression and Plan:  Dyslipidemia  -We will refill statin today. -Last LDL was 91 in September 2018.  Primary osteoarthritis of left shoulder  Chronic low back pain without sciatica, unspecified back pain laterality  -[place] printed, initialed  and scanned into the EMR.   Indication for chronic opioid:  Chronic back and shoulder pain followed by orthopedics Medication and dose:  Hydrocodone 10/325 mg 1 tablet every 6 hours as needed for pain # pills per month:  90 tablets Last UDS date:  Plan to obtain at next visit Opioid Treatment Agreement signed:  Yes Opioid Treatment Agreement last reviewed with patient:  02/02/2018 NCCSRS reviewed this encounter (include red  flags):   Yes, no red flags, overdose risk score of 270   Anxiety -She has been taking low-dose Xanax approximately 2-3 times a week, will give her 15 tablets a month with 2 refills.  Essential hypertension -Well-controlled, continue current regimen  Paroxysmal atrial fibrillation (HCC) -Appears to be in sinus rhythm today. -Have advised her that I will refill her Xarelto and Multaq today, but she will need to follow-up with cardiology for further refills.  DM type 2 with diabetic mixed hyperlipidemia ([place]) -Well-controlled, last A1c was 6 in November 2019    Patient Instructions  -Nice  seeing you today!  -Schedule follow up in 3 months.     [name], MD [facility]

## 2018-05-02 ENCOUNTER — Telehealth: Payer: Self-pay | Admitting: Pulmonary Disease

## 2018-05-02 NOTE — Telephone Encounter (Signed)
Pt needs asap PA for Esbriet [email or web address] Key: [id number]  Pt only has 2 days left of Esbriet, needs pa asap.

## 2018-05-02 NOTE — Telephone Encounter (Signed)
PA has been approved until 05/02/2019.   Called [facility] but was on hold for over 6 minutes. Will need to call them back in the morning to let them know that the PA was approved.

## 2018-05-02 NOTE — Telephone Encounter (Signed)
Medication name and strength: Esbriet 801mg   Provider: BQ Pharmacy: [facility] Patient insurance ID: Phone:  Fax:   Was the PA started on CMM?  Yes If yes, please enter the Key: [id number] Timeframe for approval/denial: N/A Submitted the PA as urgent since the patient is about to run out of medication.   Will hold this message in triage until a determination has been made.

## 2018-05-03 NOTE — Telephone Encounter (Signed)
Called [facility] [phone], spoke with Lattie Haw.  Informed of PA approval until 05/02/2019. She stated that PA approval is currently on file, and is set for delivery to Patient 05/04/18.  Nothing further at this time.

## 2018-05-03 NOTE — Telephone Encounter (Signed)
Will route to [place] as FYI.

## 2018-05-05 ENCOUNTER — Ambulatory Visit: Payer: Medicare Other | Admitting: Internal Medicine

## 2018-05-05 ENCOUNTER — Telehealth: Payer: Self-pay | Admitting: Pulmonary Disease

## 2018-05-05 NOTE — Telephone Encounter (Signed)
Spoke with pt, she states they are going to ship the Rx to her today. I advised her it was refilled for a year. Nothing further is needed.    ESBRIET 801 MG TABS [[id number]]   Order Details  Dose, Route, Frequency: As Directed   Dispense Quantity: 90 tablet Refills: 11 Fills remaining: --        Sig: TAKE 1 TABLET BY MOUTH THREE TIMES A DAY WITH MEALS. PROTECT FROM SUNBURN. CALL [phone] TO REFILL       Written Date: 04/11/18 Expiration Date: 04/11/19    Start Date: 04/11/18 End Date: --         Ordering Provider: Juanito Doom, MD DEA #:  [id number] NPI:  [id number]   Authorizing Provider: Juanito Doom, MD DEA #:  [id number] NPI:  [id number]   Ordering User:  Len Blalock, CMA            Original Order:  Pirfenidone (ESBRIET) 801 MG TABS [[id number]]    Pharmacy:  [place], [place] DEA #:  --    Pharmacy Comments:  --       Fill quantity remaining:  -- Fill quantity used:  -- Next fill due: --        Order Class   Normal  All Administrations of ESBRIET 801 MG TABS   (suggestion)  The administrations shown are only for this specific order and not for other orders for the same medication that may be in this encounter.  No Administrations Recorded         Med Administrations and Associated Flowsheet Values (last 96 hours)   None  Warnings History   No Interaction Warnings Shown   Order History  Outpatient  Date/Time Action Taken User Additional Information  04/11/18 0958 [employer], Surescripts Out   04/11/18 1100 Sign Len Blalock, [place] Reorder from Order: [id number]; Ordering Mode: Rx Refill  04/11/18 1100 [address] Len Blalock, [place]   04/20/18 1050 Taking Flag Checked Doy Mince, Ledell Noss, RN   Verbal Order Info   Action Created on Order Mode Entered by Comment Responsible Provider Signed by Signed on  Ordering 04/11/18 1100 Rx Refill Len Blalock, CMA      Tracking  Links    Cosign Tracking Order Transmittal Tracking  Outpatient Medication Detail    Disp Refills Start End   ESBRIET 801 MG TABS 90 tablet 11 04/11/2018    Sig: TAKE 1 TABLET BY MOUTH THREE TIMES A DAY WITH MEALS. PROTECT FROM SUNBURN. CALL [phone] TO REFILL   Sent to pharmacy as: [id number] 003 MG Tab   E-Prescribing Status: Receipt confirmed by pharmacy (04/11/2018 11:00 AM EST)       ILD (interstitial lung disease) ([place]) Assessment: UIP pattern on on CT Managed on Esbriet Walk in office today patient did not have any oxygen desaturations and completed 2 laps on room air Bibasilar crackles mMRC 2  Plan: Continue Esbriet therapy as prescribed Lab work today to monitor liver function Follow-up with Dr. [name] in 2 to 3 months

## 2018-05-09 DIAGNOSIS — M5412 Radiculopathy, cervical region: Secondary | ICD-10-CM | POA: Diagnosis not present

## 2018-05-16 DIAGNOSIS — M5030 Other cervical disc degeneration, unspecified cervical region: Secondary | ICD-10-CM | POA: Diagnosis not present

## 2018-05-25 DIAGNOSIS — M5412 Radiculopathy, cervical region: Secondary | ICD-10-CM | POA: Diagnosis not present

## 2018-05-25 DIAGNOSIS — M5030 Other cervical disc degeneration, unspecified cervical region: Secondary | ICD-10-CM | POA: Diagnosis not present

## 2018-05-26 ENCOUNTER — Telehealth: Payer: Self-pay | Admitting: Pulmonary Disease

## 2018-05-26 NOTE — Telephone Encounter (Signed)
Spoke with pt. She is wanting to know if she can stop Xarelto for 3 days. Pt is supposed to be having injections in her neck. An appointment has not been scheduled for the injections at this time.  BQ - please advise. Thanks!

## 2018-05-27 NOTE — Telephone Encounter (Signed)
yes

## 2018-05-27 NOTE — Telephone Encounter (Signed)
Called and spoke with pt letting her know that BQ was fine for her to stop the xarelto x3 days so she can receive the injections in her neck. Pt expressed understanding. Nothing further needed.

## 2018-05-30 ENCOUNTER — Other Ambulatory Visit: Payer: Self-pay | Admitting: Internal Medicine

## 2018-05-30 DIAGNOSIS — Z1231 Encounter for screening mammogram for malignant neoplasm of breast: Secondary | ICD-10-CM

## 2018-06-07 ENCOUNTER — Other Ambulatory Visit: Payer: Self-pay

## 2018-06-07 ENCOUNTER — Telehealth: Payer: Self-pay | Admitting: Internal Medicine

## 2018-06-07 NOTE — Telephone Encounter (Signed)
Copied from [place] [id number]. Topic: Quick Communication - Rx Refill/Question >> Jun 07, 2018  4:46 PM Selinda Flavin B, NT wrote: Medication: cyclobenzaprine (FLEXERIL) 10 MG tablet Linzess 145mg  **Not on current med list**  Has the patient contacted their pharmacy? yes (Agent: If no, request that the patient contact the pharmacy for the refill.) (Agent: If yes, when and what did the pharmacy advise?)  Preferred Pharmacy (with phone number or street name): [facility] ([place]), [place]  Agent: Please be advised that RX refills may take up to 3 business days. We ask that you follow-up with your pharmacy.

## 2018-06-07 NOTE — Patient Outreach (Signed)
  [name]) Care Management Chronic Special Needs Program  06/07/2018  Name: Miranda [name] DOB: [birth year]  MRN: [id number]  Ms. Miranda [name] is enrolled in a Chronic Special Needs Plan. RNCM called to review Health Risk assessment and complete individualized care plan. RNCM dialed [phone] and the number was not available. RNCM called [phone]. Person on the line identified self as her husband and reports client is not in. HIPPA compliant message left. Request return call.   Plan: Chronic care management coordinator will attempt outreach in 1-2 business days.  Thea Silversmith, RN, MSN, [place] [phone]

## 2018-06-08 ENCOUNTER — Other Ambulatory Visit: Payer: Self-pay | Admitting: Internal Medicine

## 2018-06-08 ENCOUNTER — Other Ambulatory Visit: Payer: Self-pay

## 2018-06-08 NOTE — Telephone Encounter (Signed)
Linzess is not on her med profile and flexeril was last prescribed in 2018. Maybe we need a webex visit to discuss.

## 2018-06-08 NOTE — Telephone Encounter (Signed)
Please advise on refill request. Both last filled by Dr. Raliegh Ip

## 2018-06-08 NOTE — Telephone Encounter (Signed)
Spoke with patient and she does not have access to the Internet.  A telephone appointment has been scheduled

## 2018-06-09 ENCOUNTER — Other Ambulatory Visit: Payer: Self-pay

## 2018-06-09 ENCOUNTER — Ambulatory Visit (INDEPENDENT_AMBULATORY_CARE_PROVIDER_SITE_OTHER): Payer: HMO | Admitting: Internal Medicine

## 2018-06-09 DIAGNOSIS — M545 Low back pain, unspecified: Secondary | ICD-10-CM

## 2018-06-09 DIAGNOSIS — K5903 Drug induced constipation: Secondary | ICD-10-CM | POA: Diagnosis not present

## 2018-06-09 DIAGNOSIS — I10 Essential (primary) hypertension: Secondary | ICD-10-CM

## 2018-06-09 DIAGNOSIS — M25512 Pain in left shoulder: Secondary | ICD-10-CM

## 2018-06-09 DIAGNOSIS — G8929 Other chronic pain: Secondary | ICD-10-CM

## 2018-06-09 MED ORDER — DOCUSATE SODIUM 100 MG PO CAPS: 100.0000 mg | ORAL_CAPSULE | Freq: Two times a day (BID) | ORAL | 0 refills | Status: DC

## 2018-06-09 MED ORDER — POLYETHYLENE GLYCOL 3350 17 GM/SCOOP PO POWD: 17.0000 g | Freq: Every day | ORAL | 1 refills | Status: DC

## 2018-06-09 MED ORDER — AMLODIPINE BESYLATE 10 MG PO TABS: 10.0000 mg | ORAL_TABLET | Freq: Every day | ORAL | 1 refills | Status: DC

## 2018-06-09 MED ORDER — CYCLOBENZAPRINE HCL 5 MG PO TABS: 5.0000 mg | ORAL_TABLET | Freq: Two times a day (BID) | ORAL | 0 refills | Status: DC | PRN

## 2018-06-09 NOTE — Progress Notes (Signed)
Virtual Visit via Telephone Note  I connected with Miranda [name] on 06/09/18 at  9:00 AM EDT by telephone and verified that I am speaking with the correct person using two identifiers.   I discussed the limitations, risks, security and privacy concerns of performing an evaluation and management service by telephone and the availability of in person appointments. I also discussed with the patient that there may be a patient responsible charge related to this service. The patient expressed understanding and agreed to proceed.  Location patient: home Location provider: work office Participants present for the call: patient, provider Patient did not have a visit in the prior 7 days to address this/these issue(s).   History of Present Illness:  She has made this appointment today for a few reasons:  1.  She needs refills of her Norvasc.  2.  She would like a prescription of Flexeril for muscle spasms of her shoulder and back.  She has chronic left shoulder pain and usually gets injections that help, however her most recent injection was canceled due to the coronavirus pandemic.  She last used Flexeril about a year ago.  3.  She is on a chronic pain regimen with hydrocodone and has been having constipation.  The lactulose that she has has not been helping.  Her sister gave her some Linzess that seemed to worked well and she would like a prescription for this.   Observations/Objective: Patient sounds cheerful and well on the phone. I do not appreciate any increased work of breathing. Speech and thought processing are grossly intact. Patient reported vitals: None reported   Current Outpatient Medications:  .  albuterol (PROVENTIL HFA;VENTOLIN HFA) 108 (90 Base) MCG/ACT inhaler, Inhale 1 puff into the lungs every 6 (six) hours as needed. , Disp: , Rfl:  .  ALPRAZolam (XANAX) 0.25 MG tablet, TAKE 1 TABLET BY MOUTH THREE TIMES DAILY AS NEEDED FOR ANXIETY FOR SLEEP, Disp: 15 tablet, Rfl:  2 .  amLODipine (NORVASC) 10 MG tablet, Take 1 tablet (10 mg total) by mouth daily., Disp: 90 tablet, Rfl: 1 .  atorvastatin (LIPITOR) 80 MG tablet, Take 1 tablet (80 mg total) by mouth daily., Disp: 90 tablet, Rfl: 1 .  cyclobenzaprine (FLEXERIL) 10 MG tablet, Take 1 tablet (10 mg total) by mouth 3 (three) times daily as needed., Disp: 90 tablet, Rfl: 1 .  cyclobenzaprine (FLEXERIL) 5 MG tablet, Take 1 tablet (5 mg total) by mouth 2 (two) times daily as needed for muscle spasms., Disp: 45 tablet, Rfl: 0 .  diclofenac sodium (VOLTAREN) 1 % GEL, Apply 2 g topically 4 (four) times daily., Disp: 1 Tube, Rfl: 1 .  docusate sodium (COLACE) 100 MG capsule, Take 1 capsule (100 mg total) by mouth 2 (two) times daily., Disp: 10 capsule, Rfl: 0 .  dronedarone (MULTAQ) 400 MG tablet, Take 1 tablet (400 mg total) by mouth 2 (two) times daily with a meal., Disp: 60 tablet, Rfl: 3 .  ESBRIET 801 MG TABS, TAKE 1 TABLET BY MOUTH THREE TIMES A DAY WITH MEALS. PROTECT FROM SUNBURN. CALL [phone] TO REFILL, Disp: 90 tablet, Rfl: 11 .  fluticasone (FLONASE) 50 MCG/ACT nasal spray, Place 1 spray into both nostrils daily. (Patient not taking: Reported on 04/20/2018), Disp: 16 g, Rfl: 0 .  furosemide (LASIX) 40 MG tablet, Take 0.5 tablets (20 mg total) by mouth daily., Disp: 90 tablet, Rfl: 2 .  HYDROcodone-acetaminophen (NORCO) 10-325 MG tablet, Take 1 tablet by mouth every 6 (six) hours as  needed., Disp: 90 tablet, Rfl: 0 .  HYDROcodone-acetaminophen (NORCO) 10-325 MG tablet, Take 1 tablet by mouth every 6 (six) hours as needed., Disp: 90 tablet, Rfl: 0 .  HYDROcodone-acetaminophen (NORCO) 10-325 MG tablet, Take 1 tablet by mouth every 6 (six) hours as needed for moderate pain. Refill 1 month, Disp: 90 tablet, Rfl: 0 .  lactulose (CONSTULOSE) 10 GM/15ML solution, TAKE 30 MILLILITERS THREE TIMES DAILY BY  MOUTH AS NEEDED FOR MILD CONSTIPATION, Disp: 473 mL, Rfl: 4 .  losartan (COZAAR) 100 MG tablet, Take 1 tablet (100 mg  total) by mouth daily., Disp: 90 tablet, Rfl: 3 .  metFORMIN (GLUCOPHAGE-XR) 500 MG 24 hr tablet, Take 2 tablets (1,000 mg total) by mouth daily with breakfast., Disp: 180 tablet, Rfl: 3 .  polyethylene glycol powder (GLYCOLAX/MIRALAX) powder, Take 17 g by mouth daily., Disp: 3350 g, Rfl: 1 .  rivaroxaban (XARELTO) 20 MG TABS tablet, Take 1 tablet (20 mg total) by mouth daily with supper., Disp: 90 tablet, Rfl: 1  Review of Systems:  Constitutional: Denies fever, chills, diaphoresis, appetite change and fatigue.  HEENT: Denies photophobia, eye pain, redness, hearing loss, ear pain, congestion, sore throat, rhinorrhea, sneezing, mouth sores, trouble swallowing, neck pain, neck stiffness and tinnitus.   Respiratory: Denies SOB, DOE, cough, chest tightness,  and wheezing.   Cardiovascular: Denies chest pain, palpitations and leg swelling.  Gastrointestinal: Denies nausea, vomiting, abdominal pain, diarrhea, constipation, blood in stool and abdominal distention.  Genitourinary: Denies dysuria, urgency, frequency, hematuria, flank pain and difficulty urinating.  Endocrine: Denies: hot or cold intolerance, sweats, changes in hair or nails, polyuria, polydipsia. Musculoskeletal: Denies myalgias, back pain, joint swelling, arthralgias and gait problem.  Skin: Denies pallor, rash and wound.  Neurological: Denies dizziness, seizures, syncope, weakness, light-headedness, numbness and headaches.  Hematological: Denies adenopathy. Easy bruising, personal or family bleeding history  Psychiatric/Behavioral: Denies suicidal ideation, mood changes, confusion, nervousness, sleep disturbance and agitation   Assessment and Plan:  Drug-induced constipation -She is on chronic narcotics she definitely needs to be on a bowel regimen. -We discussed that Linzess can be costly. -We will try Colace and MiraLAX first, she will call us if she does not have improvement in 4 to 5 days and we can send in prescription for  Linzess.  Essential hypertension  -We will refill Norvasc today, she does not have a way to check blood pressure at home.  Chronic low back pain without sciatica, unspecified back pain laterality -  Chronic left shoulder pain  -Will send prescription for Flexeril 5 mg to use up to twice daily as needed for muscle spasms.  She knows to take at bedtime first only due to potential increased sedation.   I discussed the assessment and treatment plan with the patient. The patient was provided an opportunity to ask questions and all were answered. The patient agreed with the plan and demonstrated an understanding of the instructions.   The patient was advised to call back or seek an in-person evaluation if the symptoms worsen or if the condition fails to improve as anticipated.  I provided 23 minutes of non-face-to-face time during this encounter.   [name], MD [facility] at [facility]

## 2018-06-09 NOTE — Patient Outreach (Addendum)
  [facility]) Care Management Chronic Special Needs Program  06/09/2018  Name: Miranda [name] DOB: 1945/09/30  MRN: [id number]  Ms. Miranda [name] is enrolled in a chronic special needs plan for Diabetes. Client also noted to have diagnosis of heart failure. Chronic Care Management Coordinator telephoned client to review health risk assessment and to develop individualized care plan.  Introduced the chronic care management program, importance of client participation, and taking their care plan to all provider appointments and inpatient facilities.  Reviewed the transition of care process and possible referral to community care management.  Subjective: Client reports a history of diabetes, heart failure, hypertension, heart disease, pulmonary fibrosis and atrial fibrillation. She states she does not routinely weigh self. Client is on greater than 8 medications and is receptive to [place] referral for medication review.  Goals Addressed            This Visit's Progress   .  Acknowledge receipt of Programme researcher, broadcasting/film/video      . Advanced Care Planning complete by as directed by client within the next 6 months.      . Client understands the importance of follow-up with providers by attending scheduled visits      . Client verbalize knowledge of Heart Failure disease self management skills within the next 6 months.      . Client will report keeping up with daily weights within the next 6 months.      . Client will report no worsening of symptoms of Atrial Fibrillation within the next 6 months      . Client will report no worsening of symptoms related to heart disease within the next 6 months       . Client will verbalize knowledge of chronic lung disease as evidenced by no ED visits or Inpatient stays related to chronic lung disease       . Client will verbalize knowledge of self management of Hypertension as evidences by BP reading of 140/90 or less; or as defined by  provider      . Maintain timely refills of Heart Failure medication as prescribed within the year       . Obtain annual  Lipid Profile, LDL-C      . Visit Primary Care Provider or Cardiologist at least 2 times per year         Plan:  Send successful outreach letter with a copy of their individualized care plan, Send individual care plan to provider and Send educational material Chronic care management coordination will outreach in:  6 Months Will refer client to:  Pharmacy    Thea Silversmith, RN, MSN, [place] [phone]

## 2018-06-13 ENCOUNTER — Telehealth: Payer: Self-pay | Admitting: Pharmacist

## 2018-06-13 NOTE — Patient Outreach (Signed)
[facility]) Care Management  06/13/2018  LORRAINA SPRING 04/24/1945 [id number]  Called patient per referral for CSNP Medication Review. Unfortunately, the patient did not answer her phone. HIPAA compliant message was left on her voicemail.  Plan: Send patient an unsuccessful outreach letter. Call patient back in 3-4 weeks due to referral backlog and the nature of the referral.   Elayne Guerin, PharmD, [place] Clinical Pharmacist [phone]

## 2018-06-16 ENCOUNTER — Telehealth: Payer: Self-pay

## 2018-06-16 NOTE — Telephone Encounter (Signed)
IPF-PRO registry trial  Today I placed a call to Ms. Miranda [name], Participant in the IPF-PRO registry clinical trial. There was no answer and a voicemail was left asking for a returned call. The nature of the call was for the patient to schedule a visit to come into clinic and have her blood work and patient questionnaires completed. The subject currently has a clinic office follow up visit scheduled with Dr. [name] on [id number]. I wanted to either confirm her consent to conduct the study visit at the same time as that visit, or schedule an appointment at an earlier date to complete the assessments.    [name]

## 2018-07-05 ENCOUNTER — Other Ambulatory Visit: Payer: Self-pay | Admitting: Pharmacist

## 2018-07-05 NOTE — Patient Outreach (Signed)
[facility]) Care Management  [place]   07/05/2018  ELNORIA LIVINGSTON 1945/11/22 [id number]  Reason for referral: Medication Assistance, Medication Review  Referral source: [place] Management RN with [employer] Current insurance: [employer] C-[employer]  PMHx includes but not limited to:   73 year old female with multiple medical conditions including but not limited to:  Asthma, A Fib, DJD, cataracts, type 2 diabetes, dyslipidemia, hypertension, Interstitial lung disease, OSA, and  Osteoarthritis.  Outreach:  Successful telephone call with patient.  HIPAA identifiers verified.   Subjective:  Patient reports using a telephone app as an adherence strategy. (Her granddaughter programmed her phone through an app to send an alarm when it is time to take her medications.)  Does the patient ever forget to take medication?  yes Does the patient have problems obtaining medications due to transportation?   no Does the patient have problems obtaining medications due to cost?  no  Does the patient feel that medications prescribed are effective?  yes Does the patient ever experience any side effects to the medications prescribed?  yes  Does the patient measure his/her own blood glucose at home?  Yes 98 mg/dl this morning Does the patient measure his/her own blood pressure at home? Yes 159/70 Pulse 70   Objective: Lab Results  Component Value Date   CREATININE 0.82 04/20/2018   CREATININE 0.66 11/04/2017   CREATININE 0.60 11/02/2017    Lab Results  Component Value Date   HGBA1C 6.0 02/02/2018    Lipid Panel     Component Value Date/Time   CHOL 167 11/26/2017 0908   TRIG 75 11/26/2017 0908   HDL 61 11/26/2017 0908   CHOLHDL 2.7 11/26/2017 0908   CHOLHDL 4 06/01/2014 1145   VLDL 16.8 06/01/2014 1145   LDLCALC 91 11/26/2017 0908   LDLDIRECT 152.8 03/30/2011 0859    BP Readings from Last 3 Encounters:  04/29/18 130/60  04/20/18  118/62  02/16/18 (!) 142/78    Allergies  Allergen Reactions  . Fish Oil Anaphylaxis  . Other Hives, Shortness Of Breath and Swelling    Allergic to cashew nuts and peanut oil.  Marland Kitchen Penicillins Anaphylaxis, Hives and Swelling    Has patient had a PCN reaction causing immediate rash, facial/tongue/throat swelling, SOB or lightheadedness with hypotension: Face swelling and hives started first, then swelling of the throat  Has patient had a PCN reaction causing severe rash involving mucus membranes or skin necrosis: Yes  Has patient had a PCN reaction that required hospitalization: No  Has patient had a PCN reaction occurring within the last 10 years: Yes  If all of the above answers are "NO", then may proceed with Cephalospor  . Pneumococcal Vaccines Nausea And Vomiting  . Nitrofurantoin Macrocrystal Hives  . Aspirin Itching and Rash  . Bactrim [Sulfamethoxazole-Trimethoprim] Hives, Itching and Rash  . Ciprofloxacin Hives, Itching and Rash  . Ibuprofen Rash  . Influenza Vaccines Hives  . Ivp Dye [Iodinated Diagnostic Agents] Hives, Itching and Rash    Gives benadryl to counteract symptoms  . Latex Rash  . Macrobid [Nitrofurantoin Monohydrate Macrocrystals] Hives  . Shellfish Allergy Hives    Patient also allergic to seafood    Medications Reviewed Today    Reviewed by Elayne Guerin, [facility] (Pharmacist) on 07/05/18 at 1041  Med List Status: <None>  Medication Order Taking? Sig Documenting Provider Last Dose Status Informant  albuterol (PROVENTIL HFA;VENTOLIN HFA) 108 (90 Base) MCG/ACT inhaler [id number] Yes Inhale  1 puff into the lungs every 6 (six) hours as needed.  [provider] Taking Active   ALPRAZolam Duanne Moron) 0.25 MG tablet [id number] Yes TAKE 1 TABLET BY MOUTH THREE TIMES DAILY AS NEEDED FOR ANXIETY FOR SLEEP [name], [name], MD Taking Active   amLODipine (NORVASC) 10 MG tablet [id number] Yes Take 1 tablet (10 mg total) by mouth daily. [name], [name],  MD Taking Active   atorvastatin (LIPITOR) 80 MG tablet [id number] Yes Take 1 tablet (80 mg total) by mouth daily. [name], [name], MD Taking Active        Patient not taking:       Discontinued 03/08/81 5003 (Duplicate)   cyclobenzaprine (FLEXERIL) 5 MG tablet [id number] Yes Take 1 tablet (5 mg total) by mouth 2 (two) times daily as needed for muscle spasms. [name], [name], MD Taking Active   diclofenac sodium (VOLTAREN) 1 % GEL [id number] Yes Apply 2 g topically 4 (four) times daily. Caren Griffins, MD Taking Active   docusate sodium (COLACE) 100 MG capsule [id number] Yes Take 1 capsule (100 mg total) by mouth 2 (two) times daily. [name], [name], MD Taking Active   dronedarone (MULTAQ) 400 MG tablet [id number] Yes Take 1 tablet (400 mg total) by mouth 2 (two) times daily with a meal. [name], [name], MD Taking Active   ESBRIET 801 MG TABS [id number] Yes TAKE 1 TABLET BY MOUTH THREE TIMES A DAY WITH MEALS. PROTECT FROM SUNBURN. CALL [phone] TO REFILL Juanito Doom, MD Taking Active   fluticasone [facility]) 50 MCG/ACT nasal spray [id number] Yes Place 1 spray into both nostrils daily. Billie Ruddy, MD Taking Active Self  furosemide (LASIX) 40 MG tablet [id number] Yes Take 0.5 tablets (20 mg total) by mouth daily. Marletta Lor, MD Taking Active Self  HYDROcodone-acetaminophen [facility]) 10-325 MG tablet [id number] Yes Take 1 tablet by mouth every 6 (six) hours as needed. [name], [name], MD Taking Active         Discontinued 83/09/40 7680 (Duplicate)         Discontinued 88/11/03 1594 (Duplicate)   lactulose (CONSTULOSE) 10 GM/15ML solution [id number] Yes TAKE 30 MILLILITERS THREE TIMES DAILY BY  MOUTH AS NEEDED FOR MILD CONSTIPATION Marletta Lor, MD Taking Active Self  losartan (COZAAR) 100 MG tablet [id number] Yes Take 1 tablet (100 mg total) by mouth daily. Marletta Lor, MD Taking Active   metFORMIN (GLUCOPHAGE-XR)  500 MG 24 hr tablet [id number] Yes Take 2 tablets (1,000 mg total) by mouth daily with breakfast. Marletta Lor, MD Taking Active Self  polyethylene glycol powder [facility]) powder [id number] No Take 17 g by mouth daily.  Patient not taking:  Reported on 07/05/2018   [name], [name], MD Not Taking Active   rivaroxaban (XARELTO) 20 MG TABS tablet [id number] Yes Take 1 tablet (20 mg total) by mouth daily with supper. [name], [name], MD Taking Active           Assessment:  Drugs sorted by system:  Neurologic/Psychologic: Alprazolam  Cardiovascular: Amlodipine, Atorvastatin, Multaq, Furosemide, Losartan, Xarelto  Pulmonary/Allergy: Albuterol HFA, Esbiret, Fluticasone Nasal Spray  Gastrointestinal: Docusate, Lactulose,   Endocrine: Metformin  Topical: Diclofenac gel,   Pain: Cyclobenzaprine, Hydrocodone/APAP   Medication Review Findings:  . HgA1c 6% . Risk of CNS depression with Alprazolam and Hydrocodone/APAP    Medication Adherence Findings: Adherence Review  []  Excellent (no doses missed/week)     [x]  Good (no more  than 1 dose missed/week)     []  Partial (2-3 doses missed/week) []  Poor (>3 doses missed/week)  Patient with good understanding of regimen and good understanding of indications.    Potential of compliance: good  Medication Assistance Findings:  No medication assistance needs identified  Extra Help:  Already receiving Full Extra Help Low Income Subsidy   Plan: . Will follow-up in 3 months.Elayne Guerin, PharmD, [place] Clinical Pharmacist [phone]

## 2018-07-07 DIAGNOSIS — M542 Cervicalgia: Secondary | ICD-10-CM | POA: Diagnosis not present

## 2018-07-07 DIAGNOSIS — M5030 Other cervical disc degeneration, unspecified cervical region: Secondary | ICD-10-CM | POA: Diagnosis not present

## 2018-07-11 ENCOUNTER — Ambulatory Visit: Payer: HMO

## 2018-07-13 ENCOUNTER — Encounter: Payer: Self-pay | Admitting: Nurse Practitioner

## 2018-07-13 ENCOUNTER — Other Ambulatory Visit: Payer: Self-pay

## 2018-07-13 ENCOUNTER — Ambulatory Visit (INDEPENDENT_AMBULATORY_CARE_PROVIDER_SITE_OTHER): Payer: HMO | Admitting: Nurse Practitioner

## 2018-07-13 ENCOUNTER — Ambulatory Visit: Payer: PRIVATE HEALTH INSURANCE | Admitting: Pulmonary Disease

## 2018-07-13 DIAGNOSIS — Z5181 Encounter for therapeutic drug level monitoring: Secondary | ICD-10-CM

## 2018-07-13 DIAGNOSIS — J849 Interstitial pulmonary disease, unspecified: Secondary | ICD-10-CM | POA: Diagnosis not present

## 2018-07-13 DIAGNOSIS — G4733 Obstructive sleep apnea (adult) (pediatric): Secondary | ICD-10-CM

## 2018-07-13 NOTE — Progress Notes (Signed)
Virtual Visit via Telephone Note  I connected with Miranda [name] on 07/13/18 at 10:30 AM EDT by telephone and verified that I am speaking with the correct person using two identifiers.   I discussed the limitations, risks, security and privacy concerns of performing an evaluation and management service by telephone and the availability of in person appointments. I also discussed with the patient that there may be a patient responsible charge related to this service. The patient expressed understanding and agreed to proceed.   History of Present Illness: 73 year old female never smoker followed in our office for IPF  Smoker/ Smoking History: Never smoker Maintenance: Esbriet Pt of: Dr. [name]  Synopsis: Former patient of Dr. Gwenette Greet who was hospitalized in January 2017 with pneumonia and had findings on her CT chest which were worrisome for pulmonary fibrosis.In February 2018 she had a CT angiogram of her chest diagnosing a pulmonary embolism. This is felt to be idiopathic. A follow-up CT scan of the chest in March 2018 showed usual interstitial pneumonitis.  Serologic blood work showed no evidence of a connective tissue disease.  Patient has a tele-visit today for follow-up for management of her IPF.  She was last seen by Wyn Quaker on 04/20/2018.  She had a cmet and hepatic panel checked at her last visit which were both normal.  She states that this has been a stable interval for her.  She reports that she has not had any respiratory changes since her last visit.  She continues to have occasional dyspnea with exertion.  She states that she is no longer having nausea with her Esbriet. Denies f/c/s, n/v/d, hemoptysis, PND, leg swelling.    Observations/Objective: Sleep study: 10/2016 PSG> AHI 10.5   Labs: March 2018 LFT normal, aldolase normal, CRP slightly low, sedimentation rate slightly elevated 58, hypersensitivity pneumonitis panel negative, ANA negative, anti-Jo 1 negative, rheumatoid  factor negative, SSA negative, SSB negative, SCL 70 negative  PFT Pulmonary function testing March 2017 showed a ratio of 84%, FVC 1.96 L (77% predicted), total lung capacity 3.85 L (74% predicted), DLCO 14.32 (55% predicted) March 2018 pulmonary function test ratio normal, FVC 2.25 L 90% predicted, total lung capacity 3.54 L 67% predicted, residual volume 1.26 L 56% predicted, DLCO 14.26 55% predicted September 2018 ratio normal, FVC 2.05 L 82% predicted, total lung capacity 3.87 L 74% predicted, DLCO 15.38 60%   6-minute walk: September 2018 348 m, O2 saturation nadir 93% March 2019 336 m O2 saturation nadir 99%  Imaging: February 2018: CT - compared to April 2017 there is no significant change in the peripheral and basilar predominant honeycomb change, traction bronchiectasis with significant patches of normal pulmonary parenchyma. Consistent with usual interstitial pneumonitis.  Assessment and Plan:  IPF: Patient has had a stable interval. No new complaints/concerns. She is due for 3 month lab work in May. Last labs were normal/stable. Will continue Esbriet.   OSA: Patient wears bipap nightly and states that she benefits from use. Continue CPAP at current settings.   Patient Instructions  IPF: Keep taking Esbriet as you are doing Will order CMET and hepatic function panel in 2 weeks to monitor drug toxicity   OSA: Patient continues to benefit from CPAP with good compliance and control documented Continue CPAP at current settings Continue current medications Goal of 4 hours or more usage per night Maintain healthy weight Do not drive if drowsy    Follow Up Instructions:  Follow-up with Dr. [name] in 4 months or sooner if needed  I discussed the assessment and treatment plan with the patient. The patient was provided an opportunity to ask questions and all were answered. The patient agreed with the plan and demonstrated an understanding of the instructions.   The  patient was advised to call back or seek an in-person evaluation if the symptoms worsen or if the condition fails to improve as anticipated.  I provided 22 minutes of non-face-to-face time during this encounter.   Fenton Foy, NP

## 2018-07-13 NOTE — Assessment & Plan Note (Signed)
OSA: Patient wears bipap nightly and states that she benefits from use. Continue CPAP at current settings.   Patient Instructions  IPF: Keep taking Esbriet as you are doing Will order CMET and hepatic function panel in 2 weeks to monitor drug toxicity   OSA: Patient continues to benefit from CPAP with good compliance and control documented Continue CPAP at current settings Continue current medications Goal of 4 hours or more usage per night Maintain healthy weight Do not drive if drowsy    Follow-up with Dr. [name] in 4 months or sooner if needed

## 2018-07-13 NOTE — Patient Instructions (Signed)
IPF: Keep taking Esbriet as you are doing Will order CMET and hepatic function panel in 2 weeks to monitor drug toxicity   OSA: Patient continues to benefit from CPAP with good compliance and control documented Continue CPAP at current settings Continue current medications Goal of 4 hours or more usage per night Maintain healthy weight Do not drive if drowsy    Follow-up with Dr. [name] in 4 months or sooner if needed

## 2018-07-13 NOTE — Assessment & Plan Note (Signed)
IPF: Patient has had a stable interval. No new complaints/concerns. She is due for 3 month lab work in May. Last labs were normal/stable. Will continue Esbriet.   OSA: Patient wears bipap nightly and states that she benefits from use. Continue CPAP at current settings.   Patient Instructions  IPF: Keep taking Esbriet as you are doing Will order CMET and hepatic function panel in 2 weeks to monitor drug toxicity   OSA: Patient continues to benefit from CPAP with good compliance and control documented Continue CPAP at current settings Continue current medications Goal of 4 hours or more usage per night Maintain healthy weight Do not drive if drowsy    Follow-up with Dr. [name] in 4 months or sooner if needed

## 2018-07-14 ENCOUNTER — Other Ambulatory Visit (INDEPENDENT_AMBULATORY_CARE_PROVIDER_SITE_OTHER): Payer: HMO

## 2018-07-14 DIAGNOSIS — Z5181 Encounter for therapeutic drug level monitoring: Secondary | ICD-10-CM

## 2018-07-14 LAB — COMPREHENSIVE METABOLIC PANEL
ALT: 34 U/L (ref 0–35)
AST: 24 U/L (ref 0–37)
Albumin: 4.2 g/dL (ref 3.5–5.2)
Alkaline Phosphatase: 49 U/L (ref 39–117)
BUN: 15 mg/dL (ref 6–23)
CO2: 29 mEq/L (ref 19–32)
Calcium: 9.5 mg/dL (ref 8.4–10.5)
Chloride: 103 mEq/L (ref 96–112)
Creatinine, Ser: 0.74 mg/dL (ref 0.40–1.20)
GFR: 93.4 mL/min (ref >60.00)
Glucose, Bld: 73 mg/dL (ref 70–99)
Potassium: 3.7 mEq/L (ref 3.5–5.1)
Sodium: 141 mEq/L (ref 135–145)
Total Bilirubin: 0.4 mg/dL (ref 0.2–1.2)
Total Protein: 7.7 g/dL (ref 6.0–8.3)

## 2018-07-14 LAB — HEPATIC FUNCTION PANEL
ALT: 34 U/L (ref 0–35)
AST: 24 U/L (ref 0–37)
Albumin: 4.2 g/dL (ref 3.5–5.2)
Alkaline Phosphatase: 49 U/L (ref 39–117)
Bilirubin, Direct: 0 mg/dL (ref 0.0–0.3)
Total Bilirubin: 0.4 mg/dL (ref 0.2–1.2)
Total Protein: 7.7 g/dL (ref 6.0–8.3)

## 2018-07-14 NOTE — Progress Notes (Signed)
Reviewed, agree 

## 2018-07-15 ENCOUNTER — Telehealth: Payer: Self-pay | Admitting: Pulmonary Disease

## 2018-07-15 NOTE — Telephone Encounter (Signed)
Called and spoke with pt letting her know the results of labwork. Pt expressed understanding. Nothing further needed. 

## 2018-07-15 NOTE — Telephone Encounter (Signed)
Pt is calling back [phone]

## 2018-07-15 NOTE — Telephone Encounter (Signed)
ATC pt, no answer. Left message for pt to call back.    Notes recorded by Fenton Foy, NP on 07/14/2018 at 2:46 PM EDT Please call patient to let her know that her labs are normal. Thanks.

## 2018-07-20 ENCOUNTER — Other Ambulatory Visit: Payer: Self-pay | Admitting: *Deleted

## 2018-07-20 MED ORDER — METFORMIN HCL ER 500 MG PO TB24: 1000.0000 mg | ORAL_TABLET | Freq: Every day | ORAL | 1 refills | Status: DC

## 2018-08-04 ENCOUNTER — Telehealth: Payer: Self-pay | Admitting: Internal Medicine

## 2018-08-04 NOTE — Telephone Encounter (Signed)
Copied from [place] [id number]. Topic: Quick Communication - Rx Refill/Question >> Aug 04, 2018  2:59 PM Mcneil, Ja-Kwan wrote: Medication: HYDROcodone-acetaminophen (NORCO) 10-325 MG tablet and ALPRAZolam (XANAX) 0.25 MG tablet  Has the patient contacted their pharmacy? yes   Preferred Pharmacy (with phone number or street name): [facility] ([address]), [place] - [place] [phone] (Phone) [phone] (Fax)  Agent: Please be advised that RX refills may take up to 3 business days. We ask that you follow-up with your pharmacy.

## 2018-08-05 NOTE — Telephone Encounter (Signed)
E visit for pain management please.

## 2018-08-09 ENCOUNTER — Other Ambulatory Visit: Payer: Self-pay

## 2018-08-09 ENCOUNTER — Ambulatory Visit (INDEPENDENT_AMBULATORY_CARE_PROVIDER_SITE_OTHER): Payer: HMO | Admitting: Internal Medicine

## 2018-08-09 DIAGNOSIS — K5903 Drug induced constipation: Secondary | ICD-10-CM | POA: Diagnosis not present

## 2018-08-09 DIAGNOSIS — G8929 Other chronic pain: Secondary | ICD-10-CM | POA: Diagnosis not present

## 2018-08-09 DIAGNOSIS — M545 Low back pain: Secondary | ICD-10-CM

## 2018-08-09 DIAGNOSIS — M19012 Primary osteoarthritis, left shoulder: Secondary | ICD-10-CM

## 2018-08-09 DIAGNOSIS — Z5181 Encounter for therapeutic drug level monitoring: Secondary | ICD-10-CM | POA: Diagnosis not present

## 2018-08-09 MED ORDER — HYDROCODONE-ACETAMINOPHEN 10-325 MG PO TABS: 1.0000 | ORAL_TABLET | Freq: Four times a day (QID) | ORAL | 0 refills | Status: DC | PRN

## 2018-08-09 MED ORDER — HYDROCODONE-ACETAMINOPHEN 10-325 MG PO TABS: 1.0000 | ORAL_TABLET | Freq: Four times a day (QID) | ORAL | 0 refills | Status: AC | PRN

## 2018-08-09 MED ORDER — HYDROCODONE-ACETAMINOPHEN 10-325 MG PO TABS: 1.0000 | ORAL_TABLET | Freq: Three times a day (TID) | ORAL | 0 refills | Status: AC | PRN

## 2018-08-09 NOTE — Progress Notes (Signed)
Virtual Visit via Telephone Note  I connected with Miranda [name] on 08/09/18 at  3:30 PM EDT by telephone and verified that I am speaking with the correct person using two identifiers.   I discussed the limitations, risks, security and privacy concerns of performing an evaluation and management service by telephone and the availability of in person appointments. I also discussed with the patient that there may be a patient responsible charge related to this service. The patient expressed understanding and agreed to proceed.  We initially attempted to connect via video chat but were unable to due to technical difficulties on the patient's end, so we converted this visit to a phone visit.   Location patient: home Location provider: work office Participants present for the call: patient, provider Patient did not have a visit in the prior 7 days to address this/these issue(s).   History of Present Illness:  This is a scheduled visit for medication refills per opioid pain management contract. She has had some increased upper back pain as her cervical epidural was postponed due to COVID-19. She has also been struggling with constipation. Last visit I advised used of colace and miralax, she has been using either/or but not together. She had to give herself an enema on Sunday with some relief.   Observations/Objective: Patient sounds cheerful and well on the phone. I do not appreciate any increased work of breathing. Speech and thought processing are grossly intact. Patient reported vitals: none reported   Current Outpatient Medications:  .  albuterol (PROVENTIL HFA;VENTOLIN HFA) 108 (90 Base) MCG/ACT inhaler, Inhale 1 puff into the lungs every 6 (six) hours as needed. , Disp: , Rfl:  .  ALPRAZolam (XANAX) 0.25 MG tablet, TAKE 1 TABLET BY MOUTH THREE TIMES DAILY AS NEEDED FOR ANXIETY FOR SLEEP, Disp: 15 tablet, Rfl: 2 .  amLODipine (NORVASC) 10 MG tablet, Take 1 tablet (10 mg total) by  mouth daily., Disp: 90 tablet, Rfl: 1 .  atorvastatin (LIPITOR) 80 MG tablet, Take 1 tablet (80 mg total) by mouth daily., Disp: 90 tablet, Rfl: 1 .  cyclobenzaprine (FLEXERIL) 5 MG tablet, Take 1 tablet (5 mg total) by mouth 2 (two) times daily as needed for muscle spasms., Disp: 45 tablet, Rfl: 0 .  diclofenac sodium (VOLTAREN) 1 % GEL, Apply 2 g topically 4 (four) times daily., Disp: 1 Tube, Rfl: 1 .  docusate sodium (COLACE) 100 MG capsule, Take 1 capsule (100 mg total) by mouth 2 (two) times daily., Disp: 10 capsule, Rfl: 0 .  dronedarone (MULTAQ) 400 MG tablet, Take 1 tablet (400 mg total) by mouth 2 (two) times daily with a meal., Disp: 60 tablet, Rfl: 3 .  ESBRIET 801 MG TABS, TAKE 1 TABLET BY MOUTH THREE TIMES A DAY WITH MEALS. PROTECT FROM SUNBURN. CALL [phone] TO REFILL, Disp: 90 tablet, Rfl: 11 .  fluticasone (FLONASE) 50 MCG/ACT nasal spray, Place 1 spray into both nostrils daily., Disp: 16 g, Rfl: 0 .  furosemide (LASIX) 40 MG tablet, Take 0.5 tablets (20 mg total) by mouth daily., Disp: 90 tablet, Rfl: 2 .  HYDROcodone-acetaminophen (NORCO) 10-325 MG tablet, Take 1 tablet by mouth every 6 (six) hours as needed., Disp: 90 tablet, Rfl: 0 .  HYDROcodone-acetaminophen (NORCO) 10-325 MG tablet, Take 1 tablet by mouth every 6 (six) hours as needed for up to 30 days., Disp: 90 tablet, Rfl: 0 .  HYDROcodone-acetaminophen (NORCO) 10-325 MG tablet, Take 1 tablet by mouth every 8 (eight) hours as needed for  up to 30 days., Disp: 90 tablet, Rfl: 0 .  lactulose (CONSTULOSE) 10 GM/15ML solution, TAKE 30 MILLILITERS THREE TIMES DAILY BY  MOUTH AS NEEDED FOR MILD CONSTIPATION, Disp: 473 mL, Rfl: 4 .  losartan (COZAAR) 100 MG tablet, Take 1 tablet (100 mg total) by mouth daily., Disp: 90 tablet, Rfl: 3 .  metFORMIN (GLUCOPHAGE-XR) 500 MG 24 hr tablet, Take 2 tablets (1,000 mg total) by mouth daily with breakfast., Disp: 180 tablet, Rfl: 1 .  polyethylene glycol powder (GLYCOLAX/MIRALAX) powder,  Take 17 g by mouth daily. (Patient not taking: Reported on 07/05/2018), Disp: 3350 g, Rfl: 1 .  rivaroxaban (XARELTO) 20 MG TABS tablet, Take 1 tablet (20 mg total) by mouth daily with supper., Disp: 90 tablet, Rfl: 1  Review of Systems:  Constitutional: Denies fever, chills, diaphoresis, appetite change and fatigue.  HEENT: Denies photophobia, eye pain, redness, hearing loss, ear pain, congestion, sore throat, rhinorrhea, sneezing, mouth sores, trouble swallowing, neck pain, neck stiffness and tinnitus.   Respiratory: Denies SOB, DOE, cough, chest tightness,  and wheezing.   Cardiovascular: Denies chest pain, palpitations and leg swelling.  Gastrointestinal: Denies nausea, vomiting, abdominal pain, diarrhea, blood in stool and abdominal distention.  Genitourinary: Denies dysuria, urgency, frequency, hematuria, flank pain and difficulty urinating.  Endocrine: Denies: hot or cold intolerance, sweats, changes in hair or nails, polyuria, polydipsia. Musculoskeletal: Denies myalgias, back pain, joint swelling, arthralgias and gait problem.  Skin: Denies pallor, rash and wound.  Neurological: Denies dizziness, seizures, syncope, weakness, light-headedness, numbness and headaches.  Hematological: Denies adenopathy. Easy bruising, personal or family bleeding history  Psychiatric/Behavioral: Denies suicidal ideation, mood changes, confusion, nervousness, sleep disturbance and agitation   Assessment and Plan:  Chronic low back pain without sciatica, unspecified back pain laterality  Therapeutic drug monitoring Primary osteoarthritis of left shoulder   NCCSRS reviewed in EPIC   Indication for chronic opioid: Chronic back and shoulder pain Medication and dose: Hydrocodone 10/325 q hrs PRN # pills per month: 90 Last UDS date: ?? Opioid Treatment Agreement signed: yes Opioid Treatment Agreement last reviewed with patient: 02/02/18 NCCSRS reviewed this encounter (include red flags):  yes, no red  flags, overdose risk score 170    Drug-induced constipation -Advise colace and miralax to be used together and BID for now.   I discussed the assessment and treatment plan with the patient. The patient was provided an opportunity to ask questions and all were answered. The patient agreed with the plan and demonstrated an understanding of the instructions.   The patient was advised to call back or seek an in-person evaluation if the symptoms worsen or if the condition fails to improve as anticipated.  I provided 22 minutes of non-face-to-face time during this encounter.   [name], MD [facility]

## 2018-08-09 NOTE — Telephone Encounter (Signed)
Appointment scheduled.

## 2018-08-18 ENCOUNTER — Ambulatory Visit
Admission: RE | Admit: 2018-08-18 | Discharge: 2018-08-18 | Disposition: A | Discharge status: Home / Self Care | Payer: HMO | Source: Ambulatory Visit | Attending: Internal Medicine | Admitting: Internal Medicine

## 2018-08-18 ENCOUNTER — Other Ambulatory Visit: Payer: Self-pay

## 2018-08-18 DIAGNOSIS — Z1231 Encounter for screening mammogram for malignant neoplasm of breast: Secondary | ICD-10-CM | POA: Diagnosis not present

## 2018-08-31 DIAGNOSIS — H40023 Open angle with borderline findings, high risk, bilateral: Secondary | ICD-10-CM | POA: Diagnosis not present

## 2018-08-31 DIAGNOSIS — E119 Type 2 diabetes mellitus without complications: Secondary | ICD-10-CM | POA: Diagnosis not present

## 2018-08-31 DIAGNOSIS — Z961 Presence of intraocular lens: Secondary | ICD-10-CM | POA: Diagnosis not present

## 2018-09-02 ENCOUNTER — Other Ambulatory Visit: Payer: Self-pay | Admitting: Internal Medicine

## 2018-09-02 DIAGNOSIS — F419 Anxiety disorder, unspecified: Secondary | ICD-10-CM

## 2018-09-03 ENCOUNTER — Other Ambulatory Visit: Payer: Self-pay | Admitting: Internal Medicine

## 2018-09-03 DIAGNOSIS — I480 Paroxysmal atrial fibrillation: Secondary | ICD-10-CM

## 2018-09-05 ENCOUNTER — Telehealth: Payer: Self-pay | Admitting: Internal Medicine

## 2018-09-05 DIAGNOSIS — I480 Paroxysmal atrial fibrillation: Secondary | ICD-10-CM

## 2018-09-05 NOTE — Telephone Encounter (Signed)
Medication Refill - Medication: dronedarone (MULTAQ) 400 MG tablet   Has the patient contacted their pharmacy? Yes (Agent: If no, request that the patient contact the pharmacy for the refill.) (Agent: If yes, when and what did the pharmacy advise?)Contact PCP  Preferred Pharmacy (with phone number or street name):  [facility] ([address] [place] - [place] [phone] (Phone) [phone] (Fax)     Agent: Please be advised that RX refills may take up to 3 business days. We ask that you follow-up with your pharmacy.

## 2018-09-06 MED ORDER — MULTAQ 400 MG PO TABS: ORAL_TABLET | ORAL | 1 refills | Status: DC

## 2018-09-06 NOTE — Telephone Encounter (Signed)
Copied from [place] [id number]. Topic: General - Other >> Sep 05, 2018 10:41 AM Rainey Pines A wrote: Patient stated that  the metformin is on a recall and patient would like to know if she should continue use and would like callback from nurse.

## 2018-09-06 NOTE — Telephone Encounter (Signed)
Yes, please continue metformin. Multaq is a cardiology med: looks like I refilled it last time? Does she have a cardiologist? It is not a medication that I will continue to Rx if she does not see cards.

## 2018-09-06 NOTE — Telephone Encounter (Signed)
Patient will continue her metformin.  Patient does not have a cardiologist.  She states that Dr Raliegh Ip would fill it.  Refill sent.

## 2018-09-13 ENCOUNTER — Ambulatory Visit: Payer: Self-pay | Admitting: Internal Medicine

## 2018-09-23 ENCOUNTER — Other Ambulatory Visit: Payer: Self-pay | Admitting: *Deleted

## 2018-09-23 MED ORDER — LOSARTAN POTASSIUM 100 MG PO TABS: 100.0000 mg | ORAL_TABLET | Freq: Every day | ORAL | 0 refills | Status: DC

## 2018-10-04 ENCOUNTER — Other Ambulatory Visit: Payer: Self-pay | Admitting: Pharmacist

## 2018-10-04 ENCOUNTER — Telehealth: Payer: Self-pay | Admitting: Internal Medicine

## 2018-10-04 NOTE — Patient Outreach (Signed)
[facility]) Care Management  [place]   10/04/2018  Miranda [name] June 12, 1945 [id number]  Reason for referral: Medication Management  Referral source: [employer] Care Manager with [facility] Current insurance: [employer]  PMHx includes but not limited to:  73 year old female with multiple medical conditions including but not limited to:  Asthma, A Fib, DJD, cataracts, type 2 diabetes, dyslipidemia, hypertension, Interstitial lung disease (pulmonary fibrosis), OSA,  and osteoarthritis.  Outreach:  Successful telephone call with patient.  HIPAA identifiers verified.   Subjective:  Patient reported feeling "ok".  She will be going to have an injection for her neck and back pain on Friday.  She wondered about getting a new blood glucose meter that did not cause her to prick her finger.    Patient reported her blood sugars have ranged from mid 70s- low 300s.   Objective: The 10-year ASCVD risk score Mikey Bussing DC Jr., et al., 2013) is: 26.1%   Values used to calculate the score:     Age: 17 years     Sex: Female     Is Non-Hispanic African American: Yes     Diabetic: Yes     Tobacco smoker: No     Systolic Blood Pressure: 268 mmHg     Is BP treated: Yes     HDL Cholesterol: 61 mg/dL     Total Cholesterol: 167 mg/dL  Lab Results  Component Value Date   CREATININE 0.74 07/14/2018   CREATININE 0.82 04/20/2018   CREATININE 0.66 11/04/2017    Lab Results  Component Value Date   HGBA1C 6.0 02/02/2018    Lipid Panel     Component Value Date/Time   CHOL 167 11/26/2017 0908   TRIG 75 11/26/2017 0908   HDL 61 11/26/2017 0908   CHOLHDL 2.7 11/26/2017 0908   CHOLHDL 4 06/01/2014 1145   VLDL 16.8 06/01/2014 1145   LDLCALC 91 11/26/2017 0908   LDLDIRECT 152.8 03/30/2011 0859    BP Readings from Last 3 Encounters:  04/29/18 130/60  04/20/18 118/62  02/16/18 (!) 142/78    Allergies  Allergen Reactions  . Fish Oil  Anaphylaxis  . Other Hives, Shortness Of Breath and Swelling    Allergic to cashew nuts and peanut oil.  Marland Kitchen Penicillins Anaphylaxis, Hives and Swelling    Has patient had a PCN reaction causing immediate rash, facial/tongue/throat swelling, SOB or lightheadedness with hypotension: Face swelling and hives started first, then swelling of the throat  Has patient had a PCN reaction causing severe rash involving mucus membranes or skin necrosis: Yes  Has patient had a PCN reaction that required hospitalization: No  Has patient had a PCN reaction occurring within the last 10 years: Yes  If all of the above answers are "NO", then may proceed with Cephalospor  . Pneumococcal Vaccines Nausea And Vomiting  . Nitrofurantoin Macrocrystal Hives  . Aspirin Itching and Rash  . Bactrim [Sulfamethoxazole-Trimethoprim] Hives, Itching and Rash  . Ciprofloxacin Hives, Itching and Rash  . Ibuprofen Rash  . Influenza Vaccines Hives  . Ivp Dye [Iodinated Diagnostic Agents] Hives, Itching and Rash    Gives benadryl to counteract symptoms  . Latex Rash  . Macrobid [Nitrofurantoin Monohydrate Macrocrystals] Hives  . Shellfish Allergy Hives    Patient also allergic to seafood    Medications Reviewed Today    Reviewed by Elayne Guerin, [facility] (Pharmacist) on 10/04/18 at 1146  Med List Status: <None>  Medication Order Taking?  Sig Documenting Provider Last Dose Status Informant  albuterol (PROVENTIL HFA;VENTOLIN HFA) 108 (90 Base) MCG/ACT inhaler [id number] Yes Inhale 1 puff into the lungs every 6 (six) hours as needed.  [provider] Taking Active   ALPRAZolam Duanne Moron) 0.25 MG tablet [id number] Yes TAKE 1 TABLET BY MOUTH THREE TIMES DAILY AS NEEDED FOR ANXIETY FOR SLEEP [name], [name], MD Taking Active   amLODipine (NORVASC) 10 MG tablet [id number] Yes Take 1 tablet (10 mg total) by mouth daily. [name], [name], MD Taking Active   atorvastatin (LIPITOR) 80 MG tablet [id number]  Take 1  tablet (80 mg total) by mouth daily. [name], [name], MD  Expired 07/28/18 2359   cyclobenzaprine (FLEXERIL) 5 MG tablet [id number] Yes Take 1 tablet (5 mg total) by mouth 2 (two) times daily as needed for muscle spasms. [name], [name], MD Taking Active   diclofenac sodium (VOLTAREN) 1 % GEL [id number] Yes Apply 2 g topically 4 (four) times daily. Caren Griffins, MD Taking Active   docusate sodium (COLACE) 100 MG capsule [id number] Yes Take 1 capsule (100 mg total) by mouth 2 (two) times daily. [name], [name], MD Taking Active   dronedarone (MULTAQ) 400 MG tablet [id number] Yes TAKE 1 TABLET BY MOUTH TWICE DAILY WITH A MEAL [name], [name], MD Taking Active   ESBRIET 801 MG TABS [id number] Yes TAKE 1 TABLET BY MOUTH THREE TIMES A DAY WITH MEALS. PROTECT FROM SUNBURN. CALL [phone] TO REFILL Juanito Doom, MD Taking Active   fluticasone [facility]) 50 MCG/ACT nasal spray [id number] Yes Place 1 spray into both nostrils daily. Billie Ruddy, MD Taking Active Self  furosemide (LASIX) 40 MG tablet [id number] Yes Take 0.5 tablets (20 mg total) by mouth daily. Marletta Lor, MD Taking Active Self  HYDROcodone-acetaminophen [facility]) 10-325 MG tablet [id number] Yes Take 1 tablet by mouth every 6 (six) hours as needed. [name], [name], MD Taking Active   lactulose (CONSTULOSE) 10 GM/15ML solution [id number] Yes TAKE 30 MILLILITERS THREE TIMES DAILY BY  MOUTH AS NEEDED FOR MILD CONSTIPATION Marletta Lor, MD Taking Active Self  losartan (COZAAR) 100 MG tablet [id number] Yes Take 1 tablet (100 mg total) by mouth daily. [name], [name], MD Taking Active   metFORMIN (GLUCOPHAGE-XR) 500 MG 24 hr tablet [id number] Yes Take 2 tablets (1,000 mg total) by mouth daily with breakfast. [name], [name], MD Taking Active   polyethylene glycol powder [facility]) powder [id number] Yes Take 17 g by mouth daily. [name],  [name], MD Taking Active   rivaroxaban (XARELTO) 20 MG TABS tablet [id number] Yes Take 1 tablet (20 mg total) by mouth daily with supper. [name], [name], MD Taking Active           Assessment: Drugs sorted by system:  Neurologic/Psychologic: Alprazolam  Cardiovascular: Amlodipine, Atorvastatin, Multaq, Furosemide, Losartan, Xarelto  Pulmonary/Allergy: Albuterol HFA, Esbiret, Fluticasone Nasal Spray  Gastrointestinal: Docusate, Lactulose, Polyethylene Glycol  Endocrine: Metformin  Topical: Diclofenac gel,   Pain: Cyclobenzaprine, Hydrocodone/APAP   Medication Review Findings:   HgA1c 6% (2019) (Needs new A1c but most likely delayed due to COVID)  Patient wondered about getting a meter she did not have to stick her finger to get a reading.    Her PCP office was called and a request was made for a new prescription for Freestyle Libre be sent to the patient's pharmacy (prescription must include the diagnosis code and number of times the  patient should be testing).   Patient has full extra help.   Plan: . Will follow-up in 5-7 business days [facility] [place]).   Elayne Guerin, PharmD, [place] Clinical Pharmacist [phone]

## 2018-10-04 NOTE — Telephone Encounter (Signed)
Alwyn Ren with [facility] calling to request the  freestyle libre machine and supplies ordered for the pt. Her insurance DOES cover.  It only needs dx code and number of ties she test per day.  [facility] ([address] [place] - [place] [phone] (Phone) [phone] (Fax)

## 2018-10-05 MED ORDER — FREESTYLE LIBRE 14 DAY READER DEVI: 1.0000 | Freq: Every day | 2 refills | Status: DC

## 2018-10-05 MED ORDER — FREESTYLE LIBRE 14 DAY SENSOR MISC: 2.0000 | Freq: Every day | 3 refills | Status: DC

## 2018-10-05 NOTE — Telephone Encounter (Signed)
Rx sent 

## 2018-10-06 DIAGNOSIS — M5412 Radiculopathy, cervical region: Secondary | ICD-10-CM | POA: Diagnosis not present

## 2018-10-07 ENCOUNTER — Telehealth: Payer: Self-pay | Admitting: *Deleted

## 2018-10-07 NOTE — Telephone Encounter (Signed)
   Primary Cardiologist: [name], MD   Pt contacted.  History and symptoms reviewed.  Pt will f/u with [facility] provider as scheduled.  Pt. advised that we are restricting visitors at this time and request that only patients present for check-in prior to their appointment.  All other visitors should remain in their car.  If necessary, only one visitor may come with the patient, into the building. For everyone's safety, all patients and visitor entering our practice area should expect to be screened again prior to entering our waiting area.  Orian Amberg  10/07/2018 4:55 PM     COVID-19 Pre-Screening Questions:  . In the past 7 to 10 days have you had a cough,  shortness of breath, headache, congestion, fever (100 or greater) body aches, chills, sore throat, or sudden loss of taste or sense of smell? NO . Have you been around anyone with known Covid 19? NO . Have you been around anyone who is awaiting Covid 19 test results in the past 7 to 10 days? NO . Have you been around anyone who has been exposed to Covid 19, or has mentioned symptoms of Covid 19 within the past 7 to 10 days? NO .   If you have any concerns/questions about symptoms patients report during screening (either on the phone or at threshold). Contact the provider seeing the patient or DOD for further guidance.  If neither are available contact a member of the leadership team.

## 2018-10-07 NOTE — Telephone Encounter (Signed)
LVMTCB Calling patient to confirm in office appointment on 7/27 [place]. PATIENT NEEDS COVID PRESCREEN

## 2018-10-10 ENCOUNTER — Other Ambulatory Visit: Payer: Self-pay

## 2018-10-10 ENCOUNTER — Ambulatory Visit (INDEPENDENT_AMBULATORY_CARE_PROVIDER_SITE_OTHER): Payer: HMO | Admitting: Cardiology

## 2018-10-10 ENCOUNTER — Encounter: Payer: Self-pay | Admitting: Cardiology

## 2018-10-10 VITALS — BP 142/76 | HR 60 | Ht 65.0 in | Wt 199.4 lb

## 2018-10-10 DIAGNOSIS — I480 Paroxysmal atrial fibrillation: Secondary | ICD-10-CM

## 2018-10-10 MED ORDER — HYDROCHLOROTHIAZIDE 25 MG PO TABS: 25.0000 mg | ORAL_TABLET | Freq: Every day | ORAL | 6 refills | Status: DC

## 2018-10-10 NOTE — Patient Instructions (Addendum)
Medication Instructions:  Your physician has recommended you make the following change in your medication:  1. START Hydrochlorothiazide 25 mg once daily  Please monitor your blood pressure at home.  Call if it remains elevated after medication start  * If you need a refill on your cardiac medications before your next appointment, please call your pharmacy.   Labwork: None ordered  Testing/Procedures: None ordered  Follow-Up: Your physician wants you to follow-up in: 6 months with Dr. Curt Bears.  You will receive a reminder letter in the mail two months in advance. If you don't receive a letter, please call our office to schedule the follow-up appointment.  Thank you for choosing [facility]!!   Trinidad Curet, RN [phone]  Any Other Special Instructions Will Be Listed Below (If Applicable).   Hydrochlorothiazide, HCTZ capsules or tablets What is this medicine? HYDROCHLOROTHIAZIDE (hye droe klor oh THYE a zide) is a diuretic. It increases the amount of urine passed, which causes the body to lose salt and water. This medicine is used to treat high blood pressure. It is also reduces the swelling and water retention caused by various medical conditions, such as heart, liver, or kidney disease. This medicine may be used for other purposes; ask your health care provider or pharmacist if you have questions. COMMON BRAND NAME(S): Esidrix, Ezide, HydroDIURIL, Microzide, Oretic, Zide What should I tell my health care provider before I take this medicine? They need to know if you have any of these conditions:  diabetes  gout  immune system problems, like lupus  kidney disease or kidney stones  liver disease  pancreatitis  small amount of urine or difficulty passing urine  an unusual or allergic reaction to hydrochlorothiazide, sulfa drugs, other medicines, foods, dyes, or preservatives  pregnant or trying to get pregnant  breast-feeding How should I use this medicine?  Take this medicine by mouth with a glass of water. Follow the directions on the prescription label. Take your medicine at regular intervals. Remember that you will need to pass urine frequently after taking this medicine. Do not take your doses at a time of day that will cause you problems. Do not stop taking your medicine unless your doctor tells you to. Talk to your pediatrician regarding the use of this medicine in children. Special care may be needed. Overdosage: If you think you have taken too much of this medicine contact a poison control center or emergency room at once. NOTE: This medicine is only for you. Do not share this medicine with others. What if I miss a dose? If you miss a dose, take it as soon as you can. If it is almost time for your next dose, take only that dose. Do not take double or extra doses. What may interact with this medicine?  cholestyramine  colestipol  digoxin  dofetilide  lithium  medicines for blood pressure  medicines for diabetes  medicines that relax muscles for surgery  other diuretics  steroid medicines like prednisone or cortisone This list may not describe all possible interactions. Give your health care provider a list of all the medicines, herbs, non-prescription drugs, or dietary supplements you use. Also tell them if you smoke, drink alcohol, or use illegal drugs. Some items may interact with your medicine. What should I watch for while using this medicine? Visit your doctor or health care professional for regular checks on your progress. Check your blood pressure as directed. Ask your doctor or health care professional what your blood pressure should  be and when you should contact him or her. You may need to be on a special diet while taking this medicine. Ask your doctor. Check with your doctor or health care professional if you get an attack of severe diarrhea, nausea and vomiting, or if you sweat a lot. The loss of too much body fluid  can make it dangerous for you to take this medicine. You may get drowsy or dizzy. Do not drive, use machinery, or do anything that needs mental alertness until you know how this medicine affects you. Do not stand or sit up quickly, especially if you are an older patient. This reduces the risk of dizzy or fainting spells. Alcohol may interfere with the effect of this medicine. Avoid alcoholic drinks. This medicine may increase blood sugar. Ask your healthcare provider if changes in diet or medicines are needed if you have diabetes. This medicine can make you more sensitive to the sun. Keep out of the sun. If you cannot avoid being in the sun, wear protective clothing and use sunscreen. Do not use sun lamps or tanning beds/booths. What side effects may I notice from receiving this medicine? Side effects that you should report to your doctor or health care professional as soon as possible:  allergic reactions such as skin rash or itching, hives, swelling of the lips, mouth, tongue, or throat  changes in vision  chest pain  eye pain  fast or irregular heartbeat  feeling faint or lightheaded, falls  gout attack  muscle pain or cramps  pain or difficulty when passing urine  pain, tingling, numbness in the hands or feet  redness, blistering, peeling or loosening of the skin, including inside the mouth   signs and symptoms of high blood sugar such as being more thirsty or hungry or having to urinate more than normal. You may also feel very tired or have blurry vision.  unusually weak Side effects that usually do not require medical attention (report to your doctor or health care professional if they continue or are bothersome):  change in sex drive or performance  dry mouth  headache  stomach upset This list may not describe all possible side effects. Call your doctor for medical advice about side effects. You may report side effects to FDA at 1-800-FDA-1088. Where should I keep my  medicine? Keep out of the reach of children. Store at room temperature between 15 and 30 degrees C (59 and 86 degrees F). Do not freeze. Protect from light and moisture. Keep container closed tightly. Throw away any unused medicine after the expiration date. NOTE: This sheet is a summary. It may not cover all possible information. If you have questions about this medicine, talk to your doctor, pharmacist, or health care provider.  2020 Elsevier/Gold Standard (2017-12-22 09:25:06)

## 2018-10-10 NOTE — Progress Notes (Signed)
Electrophysiology Office Note   Date:  10/10/2018   ID:  Miranda, [name] 1945-07-09, MRN [id number]  PCP:  Miranda [name], Miranda Halsted, MD  Cardiologist:  Miranda [name] Primary Electrophysiologist:  Miranda Lanius Meredith Leeds, MD    No chief complaint on file.    History of Present Illness: [name] is a 73 y.o. female who is being seen today for the evaluation of atrial fibrillation at the request of Miranda [name]. Presenting today for electrophysiology evaluation.  She has a history of depression, GERD, hypertension, hyperlipidemia, OSA on CPAP, type 2 diabetes, and recently diagnosed atrial fibrillation.  She was diagnosed with a PE February 2018 and was started on Xarelto for 6 months.  High-resolution CT showed interstitial lung disease as well as aortic atherosclerosis and a two-vessel coronary artery disease.  She had a recent echo that showed a normal ejection fraction.  She was seen in the emergency room 09/14/2017 after being found in atrial fibrillation with rapid rates.  Today, denies symptoms of palpitations, chest pain, shortness of breath, orthopnea, PND, lower extremity edema, claudication, dizziness, presyncope, syncope, bleeding, or neurologic sequela. The patient is tolerating medications without difficulties.  Overall she is feeling well.  She has noted no further episodes of atrial fibrillation.  She does state that intermittently her heart rate is slow which makes her feel weak and fatigued.  She is on no AV nodal blockers.   Past Medical History:  Diagnosis Date  . Anxiety   . Arthritis   . Asthma   . Atrial fibrillation ([place])   . CHF (congestive heart failure) ([place])    pt. unsure- but thinks she was hosp. for CHF- 2002  . [facility] kidney disease    [facility]  . Clotting disorder ([place])    blood clots in lungs/PE pulmonary embolism  . Colon polyps   . Complication of anesthesia    states requires a lot med. to put her to sleep   . DDD (degenerative disc  disease) 09/17/2011  . Depression    "sometimes "  . DM (diabetes mellitus) ([place])   . Family history of anesthesia complication   . GERD (gastroesophageal reflux disease)   . Glaucoma    bilateral, pt. admits that she is noncompliant to eye gtts.   . Hemorrhoids   . Hyperlipidemia   . Hypertension    had stress, echo- 2006 /w [place], Cardiac Cath, per pt. 2002, echo repeated 2012- [facility]   . Low back pain   . Shortness of breath   . Sleep apnea    uses c-pap- q night recently   Past Surgical History:  Procedure Laterality Date  . ABDOMINAL HYSTERECTOMY     ectopic, fibroids  . arm surgery Right   . CARDIAC CATHETERIZATION    . CATARACT EXTRACTION, BILATERAL     cataracts removed bilateral- ?IOL  . COLONOSCOPY     remote  . FISSURECTOMY  10/08/2011   Procedure: FISSURECTOMY;  Surgeon: Stark Klein, MD;  Location: [facility];  Service: General;  Laterality: N/A;  . FLEXIBLE SIGMOIDOSCOPY  02/25/2011   Procedure: FLEXIBLE SIGMOIDOSCOPY;  Surgeon: Inda Castle, MD;  Location: [facility];  Service: Endoscopy;  Laterality: N/A;  . FOOT SURGERY     bilat, heel spurs- screw in R foot   . HEMORRHOID SURGERY  10/08/2011   Procedure: HEMORRHOIDECTOMY;  Surgeon: Stark Klein, MD;  Location: [facility];  Service: General;  Laterality: N/A;  External   . SPHINCTEROTOMY  10/08/2011  Procedure: SPHINCTEROTOMY;  Surgeon: Stark Klein, MD;  Location: [facility];  Service: General;  Laterality: N/A;     Current Outpatient Medications  Medication Sig Dispense Refill  . albuterol (PROVENTIL HFA;VENTOLIN HFA) 108 (90 Base) MCG/ACT inhaler Inhale 1 puff into the lungs every 6 (six) hours as needed.     . ALPRAZolam (XANAX) 0.25 MG tablet TAKE 1 TABLET BY MOUTH THREE TIMES DAILY AS NEEDED FOR ANXIETY FOR SLEEP 15 tablet 0  . amLODipine (NORVASC) 10 MG tablet Take 1 tablet (10 mg total) by mouth daily. 90 tablet 1  . atorvastatin (LIPITOR) 80 MG tablet Take 1 tablet (80 mg total) by mouth daily. 90 tablet 1  .  Continuous Blood Gluc Receiver (FREESTYLE LIBRE 14 DAY READER) DEVI 1 each by Does not apply route daily. 1 each 2  . Continuous Blood Gluc Sensor (FREESTYLE LIBRE 14 DAY SENSOR) MISC 2 each by Does not apply route daily. 2 each 3  . cyclobenzaprine (FLEXERIL) 5 MG tablet Take 1 tablet (5 mg total) by mouth 2 (two) times daily as needed for muscle spasms. 45 tablet 0  . diclofenac sodium (VOLTAREN) 1 % GEL Apply 2 g topically 4 (four) times daily. 1 Tube 1  . docusate sodium (COLACE) 100 MG capsule Take 1 capsule (100 mg total) by mouth 2 (two) times daily. 10 capsule 0  . dronedarone (MULTAQ) 400 MG tablet TAKE 1 TABLET BY MOUTH TWICE DAILY WITH A MEAL 180 tablet 1  . ESBRIET 801 MG TABS TAKE 1 TABLET BY MOUTH THREE TIMES A DAY WITH MEALS. PROTECT FROM SUNBURN. CALL [phone] TO REFILL 90 tablet 11  . furosemide (LASIX) 40 MG tablet Take 0.5 tablets (20 mg total) by mouth daily. 90 tablet 2  . HYDROcodone-acetaminophen (NORCO) 10-325 MG tablet Take 1 tablet by mouth every 6 (six) hours as needed. 90 tablet 0  . lactulose (CONSTULOSE) 10 GM/15ML solution TAKE 30 MILLILITERS THREE TIMES DAILY BY  MOUTH AS NEEDED FOR MILD CONSTIPATION 473 mL 4  . losartan (COZAAR) 100 MG tablet Take 1 tablet (100 mg total) by mouth daily. 90 tablet 0  . metFORMIN (GLUCOPHAGE-XR) 500 MG 24 hr tablet Take 2 tablets (1,000 mg total) by mouth daily with breakfast. 180 tablet 1  . polyethylene glycol powder (GLYCOLAX/MIRALAX) powder Take 17 g by mouth daily. 3350 g 1  . rivaroxaban (XARELTO) 20 MG TABS tablet Take 1 tablet (20 mg total) by mouth daily with supper. 90 tablet 1  . hydrochlorothiazide (HYDRODIURIL) 25 MG tablet Take 1 tablet (25 mg total) by mouth daily. 30 tablet 6   No current facility-administered medications for this visit.     Allergies:   Fish oil, Other, Penicillins, Pneumococcal vaccines, Cashew nut oil, Nitrofurantoin macrocrystal, Peanut oil, Aspirin, Bactrim [sulfamethoxazole-trimethoprim],  Ciprofloxacin, Ibuprofen, Influenza vaccines, Ivp dye [iodinated diagnostic agents], Latex, Macrobid [nitrofurantoin monohydrate macrocrystals], and Shellfish allergy   Social History:  The patient  reports that she has never smoked. She has never used smokeless tobacco. She reports that she does not drink alcohol or use drugs.   Family History:  The patient's family history includes Arthritis/Rheumatoid in her sister; Asthma in her sister; Breast cancer in her mother and sister; COPD in her sister; Colon cancer in her brother and brother; Emphysema in her sister; Heart attack in her mother; Heart disease in her mother; Lung cancer in her sister.    ROS:  Please see the history of present illness.   Otherwise, review of systems is positive for  none.   All other systems are reviewed and negative.   PHYSICAL EXAM: VS:  BP (!) 142/76   Pulse 60   Ht 5\' 5"  (1.651 m)   Wt 199 lb 6.4 oz (90.4 kg)   SpO2 98%   BMI 33.18 kg/m  , BMI Body mass index is 33.18 kg/m. GEN: Well nourished, well developed, in no acute distress  HEENT: normal  Neck: no JVD, carotid bruits, or masses Cardiac: RRR; no murmurs, rubs, or gallops,no edema  Respiratory:  clear to auscultation bilaterally, normal work of breathing GI: soft, nontender, nondistended, + BS MS: no deformity or atrophy  Skin: warm and dry Neuro:  Strength and sensation are intact Psych: euthymic mood, full affect  EKG:  EKG is ordered today. Personal review of the ekg ordered shows SR, rate 59   Recent Labs: 11/02/2017: Hemoglobin 14.6; Platelets 185 07/14/2018: ALT 34; ALT 34; BUN 15; Creatinine, Ser 0.74; Potassium 3.7; Sodium 141    Lipid Panel     Component Value Date/Time   CHOL 167 11/26/2017 0908   TRIG 75 11/26/2017 0908   HDL 61 11/26/2017 0908   CHOLHDL 2.7 11/26/2017 0908   CHOLHDL 4 06/01/2014 1145   VLDL 16.8 06/01/2014 1145   LDLCALC 91 11/26/2017 0908   LDLDIRECT 152.8 03/30/2011 0859     Wt Readings from Last 3  Encounters:  10/10/18 199 lb 6.4 oz (90.4 kg)  04/29/18 189 lb 9.6 oz (86 kg)  04/20/18 187 lb (84.8 kg)      Other studies Reviewed: Additional studies/ records that were reviewed today include: TTE 10/19/17  Review of the above records today demonstrates:  - Left ventricle: The cavity size was normal. Systolic function was   normal. The estimated ejection fraction was in the range of 60%   to 65%. Wall motion was normal; there were no regional wall   motion abnormalities. Doppler parameters are consistent with   abnormal left ventricular relaxation (grade 1 diastolic   dysfunction). - Aortic valve: There was no significant regurgitation. - Mitral valve: There was trivial regurgitation. - Left atrium: The atrium was mildly dilated. - Right ventricle: The cavity size was mildly dilated. Wall   thickness was normal. - Tricuspid valve: There was trivial regurgitation. - Pulmonic valve: There was trivial regurgitation. - Inferior vena cava: The vessel was normal in size. The   respirophasic diameter changes were in the normal range (>= 50%),   consistent with normal central venous pressure.  Myoview 11/12/17  Nuclear stress EF: 66%.  There was no ST segment deviation noted during stress.  No T wave inversion was noted during stress.  The study is normal.  This is a low risk study.  The left ventricular ejection fraction is hyperdynamic (>65%).  ASSESSMENT AND PLAN:  1.  Paroxysmal atrial fibrillation: Currently on Xarelto and Multitak.  She is remained in sinus rhythm without issues.  No change.  She does have some episodes of bradycardia which makes her feel tired.  At this point she would like to avoid pacemaker implant.  This patients CHA2DS2-VASc Score and unadjusted Ischemic Stroke Rate (% per year) is equal to 4.8 % stroke rate/year from a score of 4  Above score calculated as 1 point each if present [CHF, HTN, DM, Vascular=MI/PAD/Aortic Plaque, Age if 65-74, or  Female] Above score calculated as 2 points each if present [Age > 75, or Stroke/TIA/TE]   2.  Hypertension: Blood pressure significantly elevated today and has been high in the  past.  We Miranda [name] start her on HCTZ 25 mg.  3.  Hyperlipidemia: Currently on Lipitor with goal LDL less than 70  4.  Obstructive sleep apnea: CPAP compliance encouraged  5.  Coronary artery disease: Found on CT scan with calcified LAD and circumflex plaques.  Low risk Myoview.  No changes.    Current medicines are reviewed at length with the patient today.   The patient does not have concerns regarding her medicines.  The following changes were made today: Start HCTZ 25 mg  Labs/ tests ordered today include:  Orders Placed This Encounter  Procedures  . EKG 12-Lead  . EKG 12-Lead     Disposition:   FU with Miranda [name] 6 months  Signed, Netanel Yannuzzi Meredith Leeds, MD  10/10/2018 10:05 AM     [facility] [address] [employer] [place] [address] [phone] (office) [phone] (fax)

## 2018-10-11 ENCOUNTER — Ambulatory Visit: Payer: Self-pay | Admitting: Pharmacist

## 2018-10-11 ENCOUNTER — Other Ambulatory Visit: Payer: Self-pay | Admitting: Pharmacist

## 2018-10-11 NOTE — Patient Outreach (Signed)
[facility]) Care Management  10/11/2018  Miranda [name] Jul 21, 1945 [id number]  Patient was called to follow up on medication assistance with her Freestyle Test Strips. HIPAA identifiers were obtained.  Patient confirmed she was able to pick them up. She was very appreciative for the help.  On statin (Atorvastatin) last filled 07/25/2018 #90 Metformin last filled 07/20/2018-#90  Plan: Follow up with patient in 3 months for CSNP medication review.   Elayne Guerin, PharmD, [place] Clinical Pharmacist [phone]

## 2018-10-19 ENCOUNTER — Encounter: Payer: Self-pay | Admitting: Internal Medicine

## 2018-10-19 ENCOUNTER — Ambulatory Visit (INDEPENDENT_AMBULATORY_CARE_PROVIDER_SITE_OTHER): Payer: HMO | Admitting: Internal Medicine

## 2018-10-19 ENCOUNTER — Other Ambulatory Visit: Payer: Self-pay

## 2018-10-19 VITALS — BP 140/76 | HR 83 | Temp 98.2°F | Wt 201.0 lb

## 2018-10-19 DIAGNOSIS — E785 Hyperlipidemia, unspecified: Secondary | ICD-10-CM

## 2018-10-19 DIAGNOSIS — G8929 Other chronic pain: Secondary | ICD-10-CM | POA: Diagnosis not present

## 2018-10-19 DIAGNOSIS — M19012 Primary osteoarthritis, left shoulder: Secondary | ICD-10-CM | POA: Diagnosis not present

## 2018-10-19 DIAGNOSIS — M545 Low back pain, unspecified: Secondary | ICD-10-CM

## 2018-10-19 DIAGNOSIS — E1169 Type 2 diabetes mellitus with other specified complication: Secondary | ICD-10-CM

## 2018-10-19 DIAGNOSIS — E782 Mixed hyperlipidemia: Secondary | ICD-10-CM

## 2018-10-19 DIAGNOSIS — I10 Essential (primary) hypertension: Secondary | ICD-10-CM

## 2018-10-19 LAB — POCT GLYCOSYLATED HEMOGLOBIN (HGB A1C): Hemoglobin A1C: 5.9 % — AB (ref 4.0–5.6)

## 2018-10-19 MED ORDER — HYDROCODONE-ACETAMINOPHEN 10-325 MG PO TABS: 1.0000 | ORAL_TABLET | Freq: Four times a day (QID) | ORAL | 0 refills | Status: AC | PRN

## 2018-10-19 MED ORDER — HYDROCODONE-ACETAMINOPHEN 10-325 MG PO TABS: 1.0000 | ORAL_TABLET | Freq: Four times a day (QID) | ORAL | 0 refills | Status: DC | PRN

## 2018-10-19 NOTE — Progress Notes (Signed)
Established Patient Office Visit     CC/Reason for Visit: 39-month follow-up of chronic medical conditions and necrotic refills  HPI: Miranda [name] is a 73 y.o. female who is coming in today for the above mentioned reasons. Past Medical History is significant for: Atrial fibrillation chronically anticoagulated onXareltoon rhythm therapy with [place] rate controlled on metoprolol,benign essential hypertensionthat has not been well controlled recently, prior history of PE, interstitial lung disease, GERD, type 2 diabetes, hyperlipidemiathat is uncontrolled and statin was recently increased. She has also been complaining of left shoulder pain (chronic issue) and saw orthopedics, Dr. Marlou Sa, who recommended continued physical therapy and PRN injections which she is receiving with Dr. Nelva Bush.  History of anxiety on Xanax 0.25 mg for which she takes 1 tablet approximately 3 times a week for sleep issues.She has no acute complaints this visit.   Past Medical/Surgical History: Past Medical History:  Diagnosis Date  . Anxiety   . Arthritis   . Asthma   . Atrial fibrillation ([place])   . CHF (congestive heart failure) ([place])    pt. unsure- but thinks she was hosp. for CHF- 2002  . Chronic kidney disease    [facility]  . Clotting disorder ([place])    blood clots in lungs/PE pulmonary embolism  . Colon polyps   . Complication of anesthesia    states requires a lot med. to put her to sleep   . DDD (degenerative disc disease) 09/17/2011  . Depression    "sometimes "  . DM (diabetes mellitus) ([place])   . Family history of anesthesia complication   . GERD (gastroesophageal reflux disease)   . Glaucoma    bilateral, pt. admits that she is noncompliant to eye gtts.   . Hemorrhoids   . Hyperlipidemia   . Hypertension    had stress, echo- 2006 /w [place], Cardiac Cath, per pt. 2002, echo repeated 2012- wnl   . Low back pain   . Shortness of breath   . Sleep apnea    uses c-pap-  q night recently    Past Surgical History:  Procedure Laterality Date  . ABDOMINAL HYSTERECTOMY     ectopic, fibroids  . arm surgery Right   . CARDIAC CATHETERIZATION    . CATARACT EXTRACTION, BILATERAL     cataracts removed bilateral- ?IOL  . COLONOSCOPY     remote  . FISSURECTOMY  10/08/2011   Procedure: FISSURECTOMY;  Surgeon: Stark Klein, MD;  Location: [facility];  Service: General;  Laterality: N/A;  . FLEXIBLE SIGMOIDOSCOPY  02/25/2011   Procedure: FLEXIBLE SIGMOIDOSCOPY;  Surgeon: Inda Castle, MD;  Location: [facility];  Service: Endoscopy;  Laterality: N/A;  . FOOT SURGERY     bilat, heel spurs- screw in R foot   . HEMORRHOID SURGERY  10/08/2011   Procedure: HEMORRHOIDECTOMY;  Surgeon: Stark Klein, MD;  Location: [facility];  Service: General;  Laterality: N/A;  External   . SPHINCTEROTOMY  10/08/2011   Procedure: Joan Mayans;  Surgeon: Stark Klein, MD;  Location: [facility];  Service: General;  Laterality: N/A;    Social History:  reports that she has never smoked. She has never used smokeless tobacco. She reports that she does not drink alcohol or use drugs.  Allergies: Allergies  Allergen Reactions  . Fish Oil Anaphylaxis  . Other Hives, Shortness Of Breath and Swelling    Allergic to cashew nuts and peanut oil.  Marland Kitchen Penicillins Anaphylaxis, Hives and Swelling    Has patient had a  PCN reaction causing immediate rash, facial/tongue/throat swelling, SOB or lightheadedness with hypotension: Face swelling and hives started first, then swelling of the throat  Has patient had a PCN reaction causing severe rash involving mucus membranes or skin necrosis: Yes  Has patient had a PCN reaction that required hospitalization: No  Has patient had a PCN reaction occurring within the last 10 years: Yes  If all of the above answers are "NO", then may proceed with Cephalospor  . Pneumococcal Vaccines Nausea And Vomiting  . Cashew Nut Oil   . Nitrofurantoin Macrocrystal Hives  . Peanut  Oil   . Aspirin Itching and Rash  . Bactrim [Sulfamethoxazole-Trimethoprim] Hives, Itching and Rash  . Ciprofloxacin Hives, Itching and Rash  . Ibuprofen Rash  . Influenza Vaccines Hives  . Ivp Dye [Iodinated Diagnostic Agents] Hives, Itching and Rash    Gives benadryl to counteract symptoms  . Latex Rash  . Macrobid [Nitrofurantoin Monohydrate Macrocrystals] Hives  . Shellfish Allergy Hives    Patient also allergic to seafood    Family History:  Family History  Problem Relation Age of Onset  . Heart attack Mother   . Heart disease Mother   . Breast cancer Mother   . Emphysema Sister   . Breast cancer Sister   . Arthritis/Rheumatoid Sister   . Asthma Sister   . Lung cancer Sister   . COPD Sister   . Colon cancer Brother   . Colon cancer Brother   . Anesthesia problems Neg Hx      Current Outpatient Medications:  .  albuterol (PROVENTIL HFA;VENTOLIN HFA) 108 (90 Base) MCG/ACT inhaler, Inhale 1 puff into the lungs every 6 (six) hours as needed. , Disp: , Rfl:  .  ALPRAZolam (XANAX) 0.25 MG tablet, TAKE 1 TABLET BY MOUTH THREE TIMES DAILY AS NEEDED FOR ANXIETY FOR SLEEP, Disp: 15 tablet, Rfl: 0 .  amLODipine (NORVASC) 10 MG tablet, Take 1 tablet (10 mg total) by mouth daily., Disp: 90 tablet, Rfl: 1 .  Continuous Blood Gluc Receiver (FREESTYLE LIBRE 14 DAY READER) DEVI, 1 each by Does not apply route daily., Disp: 1 each, Rfl: 2 .  Continuous Blood Gluc Sensor (FREESTYLE LIBRE 14 DAY SENSOR) MISC, 2 each by Does not apply route daily., Disp: 2 each, Rfl: 3 .  cyclobenzaprine (FLEXERIL) 5 MG tablet, Take 1 tablet (5 mg total) by mouth 2 (two) times daily as needed for muscle spasms., Disp: 45 tablet, Rfl: 0 .  diclofenac sodium (VOLTAREN) 1 % GEL, Apply 2 g topically 4 (four) times daily., Disp: 1 Tube, Rfl: 1 .  docusate sodium (COLACE) 100 MG capsule, Take 1 capsule (100 mg total) by mouth 2 (two) times daily., Disp: 10 capsule, Rfl: 0 .  dronedarone (MULTAQ) 400 MG tablet,  TAKE 1 TABLET BY MOUTH TWICE DAILY WITH A MEAL, Disp: 180 tablet, Rfl: 1 .  ESBRIET 801 MG TABS, TAKE 1 TABLET BY MOUTH THREE TIMES A DAY WITH MEALS. PROTECT FROM SUNBURN. CALL [phone] TO REFILL, Disp: 90 tablet, Rfl: 11 .  furosemide (LASIX) 40 MG tablet, Take 0.5 tablets (20 mg total) by mouth daily., Disp: 90 tablet, Rfl: 2 .  hydrochlorothiazide (HYDRODIURIL) 25 MG tablet, Take 1 tablet (25 mg total) by mouth daily., Disp: 30 tablet, Rfl: 6 .  HYDROcodone-acetaminophen (NORCO) 10-325 MG tablet, Take 1 tablet by mouth every 6 (six) hours as needed., Disp: 90 tablet, Rfl: 0 .  lactulose (CONSTULOSE) 10 GM/15ML solution, TAKE 30 MILLILITERS THREE TIMES DAILY BY  MOUTH  AS NEEDED FOR MILD CONSTIPATION, Disp: 473 mL, Rfl: 4 .  losartan (COZAAR) 100 MG tablet, Take 1 tablet (100 mg total) by mouth daily., Disp: 90 tablet, Rfl: 0 .  metFORMIN (GLUCOPHAGE-XR) 500 MG 24 hr tablet, Take 2 tablets (1,000 mg total) by mouth daily with breakfast., Disp: 180 tablet, Rfl: 1 .  polyethylene glycol powder (GLYCOLAX/MIRALAX) powder, Take 17 g by mouth daily., Disp: 3350 g, Rfl: 1 .  rivaroxaban (XARELTO) 20 MG TABS tablet, Take 1 tablet (20 mg total) by mouth daily with supper., Disp: 90 tablet, Rfl: 1 .  atorvastatin (LIPITOR) 80 MG tablet, Take 1 tablet (80 mg total) by mouth daily., Disp: 90 tablet, Rfl: 1 .  HYDROcodone-acetaminophen (NORCO) 10-325 MG tablet, Take 1 tablet by mouth every 6 (six) hours as needed for up to 5 days., Disp: 90 tablet, Rfl: 0 .  HYDROcodone-acetaminophen (NORCO) 10-325 MG tablet, Take 1 tablet by mouth every 6 (six) hours as needed for up to 5 days., Disp: 90 tablet, Rfl: 0  Review of Systems:  Constitutional: Denies fever, chills, diaphoresis, appetite change and fatigue.  HEENT: Denies photophobia, eye pain, redness, hearing loss, ear pain, congestion, sore throat, rhinorrhea, sneezing, mouth sores, trouble swallowing, neck pain, neck stiffness and tinnitus.   Respiratory:  Denies SOB, DOE, cough, chest tightness,  and wheezing.   Cardiovascular: Denies chest pain, palpitations and leg swelling.  Gastrointestinal: Denies nausea, vomiting, abdominal pain, diarrhea, constipation, blood in stool and abdominal distention.  Genitourinary: Denies dysuria, urgency, frequency, hematuria, flank pain and difficulty urinating.  Endocrine: Denies: hot or cold intolerance, sweats, changes in hair or nails, polyuria, polydipsia. Musculoskeletal: Denies myalgias, back pain, joint swelling, arthralgias and gait problem.  Skin: Denies pallor, rash and wound.  Neurological: Denies dizziness, seizures, syncope, weakness, light-headedness, numbness and headaches.  Hematological: Denies adenopathy. Easy bruising, personal or family bleeding history  Psychiatric/Behavioral: Denies suicidal ideation, mood changes, confusion, nervousness, sleep disturbance and agitation    Physical Exam: Vitals:   10/19/18 0933  BP: 140/76  Pulse: 83  Temp: 98.2 F (36.8 C)  TempSrc: Temporal  SpO2: 97%  Weight: 201 lb (91.2 kg)    Body mass index is 33.45 kg/m.   Constitutional: NAD, calm, comfortable Eyes: PERRL, lids and conjunctivae normal ENMT: Mucous membranes are moist.  Neck: normal, supple, no masses, no thyromegaly Respiratory: clear to auscultation bilaterally, no wheezing, no crackles. Normal respiratory effort. No accessory muscle use.  Cardiovascular: Regular rate and rhythm, no murmurs / rubs / gallops. No extremity edema. 2+ pedal pulses. No carotid bruits.  Abdomen: no tenderness, no masses palpated. No hepatosplenomegaly. Bowel sounds positive.  Musculoskeletal: no clubbing / cyanosis. No joint deformity upper and lower extremities. Good ROM, no contractures. Normal muscle tone.  Skin: no rashes, lesions, ulcers. No induration Neurologic: Grossly intact and nonfocal Psychiatric: Normal judgment and insight. Alert and oriented x 3. Normal mood.    Impression and Plan:   DM type 2 with diabetic mixed hyperlipidemia ([place])  -Well-controlled, A1c today is 5.9, continue metformin.  Primary osteoarthritis of left shoulder Chronic low back pain without sciatica, unspecified back pain laterality  [facility] reviewed in EPIC   Indication for chronic opioid:  Chronic back and shoulder pain Medication and dose:  Hydrocodone 10/325 mg 1 tablet every 6 hours as needed for pain # pills per month:  Limited to 90 tablets Last UDS date:  None on file, will obtain at next visit Opioid Treatment Agreement signed:  Yes Opioid Treatment Agreement last reviewed  with patient:  02/02/2018 N[facility] reviewed this encounter (include red flags):   Yes, no red flags, overdose risk score 180.   Dyslipidemia -Last LDL was 91, needs to return for physical for recheck as her statin dose has been increased since then.  Essential hypertension -Blood pressure remains uncontrolled, cardiologist just started her on hydrochlorothiazide 25 mg on 7/27, this is in addition to amlodipine 10 mg and losartan 100 mg.    Patient Instructions  -Nice seeing you today!  -Schedule follow up in 3 months. Can be virtual.     [name], MD [facility]

## 2018-10-19 NOTE — Patient Instructions (Addendum)
-  Nice seeing you today!  -Schedule follow up in 3 months. Can be virtual.

## 2018-10-20 ENCOUNTER — Telehealth: Payer: Self-pay | Admitting: Internal Medicine

## 2018-10-20 DIAGNOSIS — E1169 Type 2 diabetes mellitus with other specified complication: Secondary | ICD-10-CM

## 2018-10-20 DIAGNOSIS — M5136 Other intervertebral disc degeneration, lumbar region: Secondary | ICD-10-CM | POA: Diagnosis not present

## 2018-10-20 DIAGNOSIS — M5030 Other cervical disc degeneration, unspecified cervical region: Secondary | ICD-10-CM | POA: Diagnosis not present

## 2018-10-20 NOTE — Telephone Encounter (Signed)
Reviewed instructions with patient.  Referral for DM educator placed.

## 2018-10-20 NOTE — Telephone Encounter (Signed)
Copied from [place] [id number]. Topic: General - Inquiry >> Oct 20, 2018 10:37 AM Sheran Luz wrote: Patient requesting call back from CMA to discuss blood glucose monitor- specifically if she needs to remove before showering.

## 2018-10-31 ENCOUNTER — Other Ambulatory Visit: Payer: Self-pay | Admitting: Internal Medicine

## 2018-10-31 DIAGNOSIS — F419 Anxiety disorder, unspecified: Secondary | ICD-10-CM

## 2018-11-08 ENCOUNTER — Other Ambulatory Visit: Payer: Self-pay | Admitting: *Deleted

## 2018-11-08 MED ORDER — LACTULOSE 10 GM/15ML PO SOLN: ORAL | 4 refills | Status: DC

## 2018-11-08 NOTE — Progress Notes (Signed)
@Patient  ID: Miranda [name], female    DOB: Apr 16, 1945, 73 y.o.   MRN: [id number]  Chief Complaint  Patient presents with  . Follow-up    4 month f/u for ILD and OSA    Referring provider: [name], [name]  HPI:  73 year old female never smoker followed in our office for IPF  Past medical history: Hypertension, diabetes, dyslipidemia, GERD, A. fib with RVR (managed on Xarelto), LVH Smoker/ Smoking History: Never smoker Maintenance: Esbriet Pt of: Dr. [name]   11/09/2018  - Visit   73 year old female never smoker presenting to our office today for a follow-up for her IPF which she is managed on Patrick for.  Patient is also followed in our office for mild obstructive sleep apnea.  Patient reports she is been having difficulty with using her CPAP which she has a scheduled appointment with her DME company tomorrow to further review.  They believe the CPAP machine may have been broken.  She plans to have this addressed tomorrow.  Patient is not having any sort of issues of nausea at this time related to her Esbriet.  She is due for blood work to monitor liver functioning.  Patient reports that she has not received her flu vaccine or her pneumonia vaccine.  She reports that her previous primary care doctor informed her not to receive them.  She does have an allergy listed to nausea and vomiting after the pneumococcal vaccine.  She also reports that many years ago after receiving the flu vaccine as she was a [employer] employee she had an anaphylactic reaction.   Tests:   Sleep study: 10/2016 PSG> AHI 10.5   Labs: March 2018 LFT normal, aldolase normal, CRP slightly low, sedimentation rate slightly elevated 58, hypersensitivity pneumonitis panel negative, ANA negative, anti-Jo 1 negative, rheumatoid factor negative, SSA negative, SSB negative, SCL 70 negative  PFT Pulmonary function testing March 2017 showed a ratio of 84%, FVC 1.96 L (77% predicted), total lung capacity 3.85 L  (74% predicted), DLCO 14.32 (55% predicted) March 2018 pulmonary function test ratio normal, FVC 2.25 L 90% predicted, total lung capacity 3.54 L 67% predicted, residual volume 1.26 L 56% predicted, DLCO 14.26 55% predicted September 2018 ratio normal, FVC 2.05 L 82% predicted, total lung capacity 3.87 L 74% predicted, DLCO 15.38 60%   6-minute walk: September 2018 348 m, O2 saturation nadir 93% March 2019 336 m O2 saturation nadir 99%  Imaging: February 2018: CT - compared to April 2017 there is no significant change in the peripheral and basilar predominant honeycomb change, traction bronchiectasis with significant patches of normal pulmonary parenchyma. Consistent with usual interstitial pneumonitis.  SIX MIN WALK 04/20/2018 06/09/2017 12/07/2016  Medications - amlodipine 10mg , flonase 53mcg, furosemide 40mg , losartan 100mg , metformin 500mg , metoprolol 25mg , Esbriet 801mg , prednisone 20mg  all taken around 9:30 Lasix 40 mg, Cozaar 50mg , Lovastatin 40mg , Metoprolol 25mg , Pirfenidone 801 mg all at 9:30, Xarelto 20mg   10:00am  Supplimental Oxygen during Test? (L/min) No No No  Laps - 7 7  Partial Lap (in Meters) - 0 12  Baseline BP (sitting) - 120/62 130/64  Baseline Heartrate - 52 58  Baseline Dyspnea (Borg Scale) - 2 1  Baseline Fatigue (Borg Scale) - 2 4  Baseline SPO2 - 100 100  BP (sitting) - 140/64 140/60  Heartrate - 68 65  Dyspnea (Borg Scale) - 4 7  Fatigue (Borg Scale) - 3 7  SPO2 - 99 93  BP (sitting) - 130/64 126/62  Heartrate - 58 53  SPO2 - 100 98  Stopped or Paused before Six Minutes - No No  Interpretation - Leg pain -  Distance Completed - 336 348  Tech Comments: Patient was able to complete 2 laps without O2. After the first lap, she had SOB. Denied any signs of chest pain or leg pain. O2 was not needed during or after walk.  Pt walked at a normal pace without any stopping and did not state any complaints or concerns during walk. Pt caomplained of beinf really fatigued  during the walk TA/CMA     FENO:  No results found for: NITRICOXIDE  PFT: PFT Results Latest Ref Rng & Units 06/07/2017 12/07/2016 05/27/2016 06/12/2015  FVC-Pre L 2.16 1.95 2.35 1.81  FVC-Predicted Pre % 87 78 94 71  FVC-Post L 2.21 2.05 2.25 1.96  FVC-Predicted Post % 89 82 90 77  Pre FEV1/FVC % % 84 86 83 85  Post FEV1/FCV % % 85 88 86 84  FEV1-Pre L 1.81 1.67 1.96 1.55  FEV1-Predicted Pre % 94 86 100 78  FEV1-Post L 1.88 1.81 1.93 1.65  DLCO UNC% % 57 60 55 55  DLCO COR %Predicted % 85 83 87 95  TLC L 3.68 3.87 3.54 3.85  TLC % Predicted % 70 74 67 74  RV % Predicted % 67 81 56 86    Imaging: No results found.    Specialty Problems      Pulmonary Problems   Asthma    Qualifier: Diagnosis of  By: Burnice Logan  MD, Doretha Sou   Overview:  Overview:  Qualifier: Diagnosis of  By: Burnice Logan  MD, Doretha Sou      Respiratory condition due to chemical fumes and vapors ([place])    Overview:  Overview:  Qualifier: Diagnosis of  By: Burnice Logan  MD, Doretha Sou      Obstructive sleep apnea syndrome    NPSG 2005:  AHI 7/hr  Sleep study: 10/2016 PSG> AHI 10.5        UIP (usual interstitial pneumonitis) (HCC)    History of rheumatoid arthritis 06/2015 > complete resolution of the ground glass, but stable findings worrisome for UIP in bases of lungs, repeat high resolution CT chest recommended in 6-12 months      ILD (interstitial lung disease) ([place])   IPF (idiopathic pulmonary fibrosis) ([place])    Labs: March 2018 LFT normal, aldolase normal, CRP slightly low, sedimentation rate slightly elevated 58, hypersensitivity pneumonitis panel negative, ANA negative, anti-Jo 1 negative, rheumatoid factor negative, SSA negative, SSB negative, SCL 70 negative  PFT Pulmonary function testing March 2017 showed a ratio of 84%, FVC 1.96 L (77% predicted), total lung capacity 3.85 L (74% predicted), DLCO 14.32 (55% predicted) March 2018 pulmonary function test ratio normal, FVC 2.25 L 90%  predicted, total lung capacity 3.54 L 67% predicted, residual volume 1.26 L 56% predicted, DLCO 14.26 55% predicted September 2018 ratio normal, FVC 2.05 L 82% predicted, total lung capacity 3.87 L 74% predicted, DLCO 15.38 60%   Imaging: February 2018: CT - compared to April 2017 there is no significant change in the peripheral and basilar predominant honeycomb change, traction bronchiectasis with significant patches of normal pulmonary parenchyma. Consistent with usual interstitial pneumonitis.  Esbriet was favored based off the patient's need for Xarelto.         Allergies  Allergen Reactions  . Fish Oil Anaphylaxis  . Other Hives, Shortness Of Breath and Swelling    Allergic to cashew nuts and peanut oil.  Marland Kitchen Penicillins  Anaphylaxis, Hives and Swelling    Has patient had a PCN reaction causing immediate rash, facial/tongue/throat swelling, SOB or lightheadedness with hypotension: Face swelling and hives started first, then swelling of the throat  Has patient had a PCN reaction causing severe rash involving mucus membranes or skin necrosis: Yes  Has patient had a PCN reaction that required hospitalization: No  Has patient had a PCN reaction occurring within the last 10 years: Yes  If all of the above answers are "NO", then may proceed with Cephalospor  . Pneumococcal Vaccines Nausea And Vomiting  . Cashew Nut Oil   . Nitrofurantoin Macrocrystal Hives  . Peanut Oil   . Aspirin Itching and Rash  . Bactrim [Sulfamethoxazole-Trimethoprim] Hives, Itching and Rash  . Ciprofloxacin Hives, Itching and Rash  . Ibuprofen Rash  . Influenza Vaccines Hives  . Ivp Dye [Iodinated Diagnostic Agents] Hives, Itching and Rash    Gives benadryl to counteract symptoms  . Latex Rash  . Macrobid [Nitrofurantoin Monohydrate Macrocrystals] Hives  . Shellfish Allergy Hives    Patient also allergic to seafood    Immunization History  Administered Date(s) Administered  . PPD Test 11/19/2010   Had  anaphylactic reaction to flu vaccine in past   Past Medical History:  Diagnosis Date  . Anxiety   . Arthritis   . Asthma   . Atrial fibrillation ([place])   . CHF (congestive heart failure) ([place])    pt. unsure- but thinks she was hosp. for CHF- 2002  . [facility]    [facility]  . Clotting disorder ([place])    blood clots in lungs/PE pulmonary embolism  . Colon polyps   . Complication of anesthesia    states requires a lot med. to put her to sleep   . DDD (degenerative disc disease) 09/17/2011  . Depression    "sometimes "  . DM (diabetes mellitus) ([place])   . Family history of anesthesia complication   . GERD (gastroesophageal reflux disease)   . Glaucoma    bilateral, pt. admits that she is noncompliant to eye gtts.   . Hemorrhoids   . Hyperlipidemia   . Hypertension    had stress, echo- 2006 /w [place], Cardiac Cath, per pt. 2002, echo repeated 2012- wnl   . Low back pain   . Shortness of breath   . Sleep apnea    uses c-pap- q night recently    Tobacco History: Social History   Tobacco Use  Smoking Status Never Smoker  Smokeless Tobacco Never Used   Counseling given: Yes  Continue to not smoke  Outpatient Encounter Medications as of 11/09/2018  Medication Sig  . albuterol (PROVENTIL HFA;VENTOLIN HFA) 108 (90 Base) MCG/ACT inhaler Inhale 1 puff into the lungs every 6 (six) hours as needed.   . ALPRAZolam (XANAX) 0.25 MG tablet TAKE 1 TABLET BY MOUTH THREE TIMES DAILY AS NEEDED FOR ANXIETY FOR SLEEP  . amLODipine (NORVASC) 10 MG tablet Take 1 tablet (10 mg total) by mouth daily.  . Continuous Blood Gluc Receiver (FREESTYLE LIBRE 14 DAY READER) DEVI 1 each by Does not apply route daily.  . Continuous Blood Gluc Sensor (FREESTYLE LIBRE 14 DAY SENSOR) MISC 2 each by Does not apply route daily.  . cyclobenzaprine (FLEXERIL) 5 MG tablet Take 1 tablet (5 mg total) by mouth 2 (two) times daily as needed for muscle spasms.  . diclofenac sodium (VOLTAREN) 1 %  GEL Apply 2 g topically 4 (four) times daily.  Marland Kitchen docusate  sodium (COLACE) 100 MG capsule Take 1 capsule (100 mg total) by mouth 2 (two) times daily.  Marland Kitchen dronedarone (MULTAQ) 400 MG tablet TAKE 1 TABLET BY MOUTH TWICE DAILY WITH A MEAL  . ESBRIET 801 MG TABS TAKE 1 TABLET BY MOUTH THREE TIMES A DAY WITH MEALS. PROTECT FROM SUNBURN. CALL [phone] TO REFILL  . furosemide (LASIX) 40 MG tablet Take 0.5 tablets (20 mg total) by mouth daily.  . hydrochlorothiazide (HYDRODIURIL) 25 MG tablet Take 1 tablet (25 mg total) by mouth daily.  Marland Kitchen HYDROcodone-acetaminophen (NORCO) 10-325 MG tablet Take 1 tablet by mouth every 6 (six) hours as needed.  . lactulose (CONSTULOSE) 10 GM/15ML solution TAKE 30 MILLILITERS THREE TIMES DAILY BY  MOUTH AS NEEDED FOR MILD CONSTIPATION  . losartan (COZAAR) 100 MG tablet Take 1 tablet (100 mg total) by mouth daily.  . metFORMIN (GLUCOPHAGE-XR) 500 MG 24 hr tablet Take 2 tablets (1,000 mg total) by mouth daily with breakfast.  . polyethylene glycol powder (GLYCOLAX/MIRALAX) powder Take 17 g by mouth daily.  . rivaroxaban (XARELTO) 20 MG TABS tablet Take 1 tablet (20 mg total) by mouth daily with supper.  Marland Kitchen atorvastatin (LIPITOR) 80 MG tablet Take 1 tablet (80 mg total) by mouth daily.   No facility-administered encounter medications on file as of 11/09/2018.      Review of Systems  Review of Systems  Constitutional: Negative for activity change, fatigue and fever.  HENT: Negative for sinus pressure, sinus pain and sore throat.   Respiratory: Negative for cough, shortness of breath and wheezing.   Cardiovascular: Negative for chest pain and palpitations.  Gastrointestinal: Negative for diarrhea, nausea and vomiting.  Musculoskeletal: Negative for arthralgias.  Neurological: Negative for dizziness.  Psychiatric/Behavioral: Negative for sleep disturbance. The patient is not nervous/anxious.      Physical Exam  BP 120/70   Pulse (!) 51   Temp 97.6 F (36.4 C)  (Oral)   Ht 5\' 5"  (1.651 m)   Wt 196 lb 3.2 oz (89 kg)   SpO2 98%   BMI 32.65 kg/m   Wt Readings from Last 5 Encounters:  11/09/18 196 lb 3.2 oz (89 kg)  10/19/18 201 lb (91.2 kg)  10/10/18 199 lb 6.4 oz (90.4 kg)  04/29/18 189 lb 9.6 oz (86 kg)  04/20/18 187 lb (84.8 kg)     Physical Exam Vitals signs and nursing note reviewed.  Constitutional:      General: She is not in acute distress.    Appearance: Normal appearance. She is normal weight.  HENT:     Head: Normocephalic and atraumatic.     [name] Ear: Tympanic membrane, ear canal and external ear normal. There is no impacted cerumen.     Left Ear: Tympanic membrane, ear canal and external ear normal. There is no impacted cerumen.     Nose: Nose normal. No congestion or rhinorrhea.     Mouth/Throat:     Mouth: Mucous membranes are dry.     Pharynx: Oropharynx is clear.  Eyes:     Pupils: Pupils are equal, round, and reactive to light.  Neck:     Musculoskeletal: Normal range of motion.  Cardiovascular:     Rate and Rhythm: Normal rate. Rhythm regularly irregular.     Pulses: Normal pulses.     Heart sounds: Normal heart sounds. No murmur.  Pulmonary:     Effort: Pulmonary effort is normal. No respiratory distress.     Breath sounds: No decreased air movement. Rales (Bibasilar crackles)  present. No decreased breath sounds or wheezing.  Skin:    General: Skin is warm and dry.     Capillary Refill: Capillary refill takes less than 2 seconds.  Neurological:     General: No focal deficit present.     Mental Status: She is alert and oriented to person, place, and time. Mental status is at baseline.     Gait: Gait normal.  Psychiatric:        Mood and Affect: Mood normal.        Behavior: Behavior normal.        Thought Content: Thought content normal.        Judgment: Judgment normal.      Lab Results:  CBC    Component Value Date/Time   WBC 6.8 11/02/2017 2304   RBC 4.79 11/02/2017 2304   HGB 14.6  11/02/2017 2329   HCT 43.0 11/02/2017 2329   PLT 185 11/02/2017 2304   MCV 88.9 11/02/2017 2304   MCH 27.6 11/02/2017 2304   MCHC 31.0 11/02/2017 2304   RDW 14.1 11/02/2017 2304   LYMPHSABS 2.7 11/02/2017 2304   MONOABS 0.6 11/02/2017 2304   EOSABS 0.2 11/02/2017 2304   BASOSABS 0.0 11/02/2017 2304    BMET    Component Value Date/Time   NA 141 07/14/2018 0932   K 3.7 07/14/2018 0932   CL 103 07/14/2018 0932   CO2 29 07/14/2018 0932   GLUCOSE 73 07/14/2018 0932   BUN 15 07/14/2018 0932   CREATININE 0.74 07/14/2018 0932   CALCIUM 9.5 07/14/2018 0932   GFRNONAA >60 11/04/2017 0410   GFRAA >60 11/04/2017 0410    BNP    Component Value Date/Time   BNP 29.7 09/14/2017 1556    ProBNP    Component Value Date/Time   PROBNP 61.6 08/30/2010 0710      Assessment & Plan:   IPF (idiopathic pulmonary fibrosis) ([place]) Plan: We will order high-resolution CT chest We will order pulmonary function test-spirometry with DLCO We will work to get the patient established with Dr. 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  Patient unclear on exact reason why she cannot receive vaccines.  She is a fairly poor historian regarding an experience over 10 years ago where she had an anaphylactic reaction to a flu vaccine.  With the discussion leading to her reporting a previous anaphylactic reaction I believe it is reasonable for [place] to hold off on vaccination today.  I would like for the patient to follow back up with primary care regarding this.  She reports that her previous PCP specifically stated that she cannot receive the pneumonia vaccine or flu vaccine anymore.  UIP (usual  interstitial pneumonitis) ([place]) Chart review from 2017 reveals that patient has a history of rheumatoid arthritis Patient reports that she does not have any sort of myalgia or symptoms today 2018 blood work shows a negative Rh factor. Likely patient has IPF.  Plan: We will order lab work today for repeat Rh factor as well as CCP Continue Esbriet at this time    Return in about 6 weeks (around 12/21/2018), or if symptoms worsen or fail to improve, for Follow up with Dr. Vaughan Browner - new pt needs 53min slot - IPF, Follow  up for Spirometry with DLCO.   Lauraine Rinne, NP 11/09/2018   This appointment was 42 minutes long with over 50% of the time in direct face-to-face patient care, assessment, plan of care, and follow-up.

## 2018-11-09 ENCOUNTER — Ambulatory Visit: Payer: HMO | Admitting: Nurse Practitioner

## 2018-11-09 ENCOUNTER — Other Ambulatory Visit: Payer: Self-pay | Admitting: Internal Medicine

## 2018-11-09 ENCOUNTER — Encounter: Payer: Self-pay | Admitting: Pulmonary Disease

## 2018-11-09 ENCOUNTER — Telehealth: Payer: Self-pay | Admitting: Pulmonary Disease

## 2018-11-09 ENCOUNTER — Other Ambulatory Visit: Payer: Self-pay

## 2018-11-09 ENCOUNTER — Ambulatory Visit: Payer: HMO | Admitting: Pulmonary Disease

## 2018-11-09 VITALS — BP 120/70 | HR 51 | Temp 97.6°F | Ht 65.0 in | Wt 196.2 lb

## 2018-11-09 DIAGNOSIS — J849 Interstitial pulmonary disease, unspecified: Secondary | ICD-10-CM | POA: Diagnosis not present

## 2018-11-09 DIAGNOSIS — M25512 Pain in left shoulder: Secondary | ICD-10-CM

## 2018-11-09 DIAGNOSIS — G4733 Obstructive sleep apnea (adult) (pediatric): Secondary | ICD-10-CM | POA: Diagnosis not present

## 2018-11-09 DIAGNOSIS — M545 Low back pain, unspecified: Secondary | ICD-10-CM

## 2018-11-09 DIAGNOSIS — Z5181 Encounter for therapeutic drug level monitoring: Secondary | ICD-10-CM

## 2018-11-09 DIAGNOSIS — Z0000 Encounter for general adult medical examination without abnormal findings: Secondary | ICD-10-CM | POA: Insufficient documentation

## 2018-11-09 DIAGNOSIS — G8929 Other chronic pain: Secondary | ICD-10-CM

## 2018-11-09 DIAGNOSIS — J84112 Idiopathic pulmonary fibrosis: Secondary | ICD-10-CM | POA: Diagnosis not present

## 2018-11-09 LAB — COMPREHENSIVE METABOLIC PANEL
ALT: 25 U/L (ref 0–35)
AST: 20 U/L (ref 0–37)
Albumin: 4.6 g/dL (ref 3.5–5.2)
Alkaline Phosphatase: 47 U/L (ref 39–117)
BUN: 20 mg/dL (ref 6–23)
CO2: 29 mEq/L (ref 19–32)
Calcium: 10 mg/dL (ref 8.4–10.5)
Chloride: 99 mEq/L (ref 96–112)
Creatinine, Ser: 0.98 mg/dL (ref 0.40–1.20)
GFR: 67.48 mL/min (ref >60.00)
Glucose, Bld: 103 mg/dL — ABNORMAL HIGH (ref 70–99)
Potassium: 3.4 mEq/L — ABNORMAL LOW (ref 3.5–5.1)
Sodium: 138 mEq/L (ref 135–145)
Total Bilirubin: 0.5 mg/dL (ref 0.2–1.2)
Total Protein: 8.2 g/dL (ref 6.0–8.3)

## 2018-11-09 NOTE — Assessment & Plan Note (Signed)
Discussed with patient today need for preventative vaccines.  Patient unclear on exact reason why she cannot receive vaccines.  She is a fairly poor historian regarding an experience over 10 years ago where she had an anaphylactic reaction to a flu vaccine.  With the discussion leading to her reporting a previous anaphylactic reaction I believe it is reasonable for [place] to hold off on vaccination today.  I would like for the patient to follow back up with primary care regarding this.  She reports that her previous PCP specifically stated that she cannot receive the pneumonia vaccine or flu vaccine anymore.

## 2018-11-09 NOTE — Patient Instructions (Addendum)
You were seen today by Lauraine Rinne, NP  for:   1. IPF (idiopathic pulmonary fibrosis) ([place])  Walk today in office Continue Esbriet as ordered 3 times daily Lab work today in office We will order high-resolution CT chest We will also order a breathing test spirometry with DLCO to further evaluate your breathing We will coordinate to get you set up with Dr. Vaughan Browner as a new patient in our office  2. Obstructive sleep apnea syndrome  We recommend that you continue using your CPAP daily >>>Keep up the hard work using your device >>> Goal should be wearing this for the entire night that you are sleeping, at least 4 to 6 hours  Remember:  . Do not drive or operate heavy machinery if tired or drowsy.  . Please notify the supply company and office if you are unable to use your device regularly due to missing supplies or machine being broken.  . Work on maintaining a healthy weight and following your recommended nutrition plan  . Maintain proper daily exercise and movement  . Maintaining proper use of your device can also help improve management of other chronic illnesses such as: Blood pressure, blood sugars, and weight management.   BiPAP/ CPAP Cleaning:  >>>Clean weekly, with Dawn soap, and bottle brush.  Set up to air dry.    3. Therapeutic drug monitoring  Lab work today  4. Healthcare maintenance  Remain active  Discussed with primary care your previous reactions with the flu vaccine as well as the pneumonia vaccine We will hold off on administering vaccines today due to you reporting that you had previously had an anaphylactic reaction in the past   Follow Up:    Return in about 6 weeks (around 12/21/2018), or if symptoms worsen or fail to improve, for Follow up with Dr. Vaughan Browner - new pt needs 60min slot - IPF, Follow up for Spirometry with DLCO.   Please do your part to reduce the spread of COVID-19:      Reduce your risk of any infection  and COVID19 by using the  similar precautions used for avoiding the common cold or flu:  Marland Kitchen Wash your hands often with soap and warm water for at least 20 seconds.  If soap and water are not readily available, use an alcohol-based hand sanitizer with at least 60% alcohol.  . If coughing or sneezing, cover your mouth and nose by coughing or sneezing into the elbow areas of your shirt or coat, into a tissue or into your sleeve (not your hands). Langley Gauss A MASK when in public  . Avoid shaking hands with others and consider head nods or verbal greetings only. . Avoid touching your eyes, nose, or mouth with unwashed hands.  . Avoid close contact with people who are sick. . Avoid places or events with large numbers of people in one location, like concerts or sporting events. . If you have some symptoms but not all symptoms, continue to monitor at home and seek medical attention if your symptoms worsen. . If you are having a medical emergency, call 911.   [place] / e-Visit: [email or web address]         [facility]: [phone]  [facility]: [id number]                   [facility]: [id number]     It is flu season:   >>> Best  ways to protect herself from the flu: Receive the yearly flu vaccine, practice good hand hygiene washing with soap and also using hand sanitizer when available, eat a nutritious meals, get adequate rest, hydrate appropriately   Please contact the office if your symptoms worsen or you have concerns that you are not improving.   Thank you for choosing [facility] for your healthcare, and for allowing [place] to partner with you on your healthcare journey. I am thankful to be able to provide care to you today.   Wyn Quaker FNP-C

## 2018-11-09 NOTE — Assessment & Plan Note (Signed)
Chart review from 2017 reveals that patient has a history of rheumatoid arthritis Patient reports that she does not have any sort of myalgia or symptoms today 2018 blood work shows a negative Rh factor. Likely patient has IPF.  Plan: We will order lab work today for repeat Rh factor as well as CCP Continue Esbriet at this time

## 2018-11-09 NOTE — Telephone Encounter (Signed)
Patient was seen today by Aaron Edelman. He would like for the patient to have a spirometry and DLCO when she returns to see Dr. Vaughan Browner in October. She has scheduled to see Dr. Vaughan Browner on 12/21/18. It would be great if we could get the PFT done on the same day as the appointment.   Will route to [facility] for follow up.

## 2018-11-09 NOTE — Assessment & Plan Note (Signed)
Plan: Keep scheduled appointment with DME company Continue CPAP daily

## 2018-11-09 NOTE — Assessment & Plan Note (Signed)
Plan: Lab work today 

## 2018-11-09 NOTE — Assessment & Plan Note (Signed)
Plan: We will order high-resolution CT chest We will order pulmonary function test-spirometry with DLCO We will work to get the patient established with Dr. Vaughan Browner in a 30-minute slot for ILD Continue taking Esbriet Lab work today to monitor liver functioning

## 2018-11-10 LAB — RHEUMATOID FACTOR: Rheumatoid fact SerPl-aCnc: <14 IU/mL (ref <14)

## 2018-11-10 LAB — CYCLIC CITRUL PEPTIDE ANTIBODY, IGG: Cyclic Citrullin Peptide Ab: <16 UNITS

## 2018-11-10 NOTE — Progress Notes (Signed)
Lab work stable.  Please route blood work to primary care.  Patient should follow-up with primary care regarding slightly low potassium levels.  Encourage patient to eat potassium rich foods such as spinach or bananas.  Wyn Quaker, FNP

## 2018-11-14 NOTE — Progress Notes (Signed)
Reviewed, agree 

## 2018-11-19 ENCOUNTER — Other Ambulatory Visit: Payer: Self-pay | Admitting: Internal Medicine

## 2018-11-19 DIAGNOSIS — E785 Hyperlipidemia, unspecified: Secondary | ICD-10-CM

## 2018-12-01 ENCOUNTER — Other Ambulatory Visit: Payer: Self-pay | Admitting: Internal Medicine

## 2018-12-01 DIAGNOSIS — I10 Essential (primary) hypertension: Secondary | ICD-10-CM

## 2018-12-05 NOTE — Telephone Encounter (Signed)
Noted. Patrice aware PFT needs scheduling.  

## 2018-12-06 ENCOUNTER — Other Ambulatory Visit: Payer: Self-pay | Admitting: Internal Medicine

## 2018-12-06 DIAGNOSIS — F419 Anxiety disorder, unspecified: Secondary | ICD-10-CM

## 2018-12-06 NOTE — Telephone Encounter (Signed)
Forwarding to PCP for approval  

## 2018-12-08 ENCOUNTER — Other Ambulatory Visit: Payer: Self-pay

## 2018-12-08 ENCOUNTER — Other Ambulatory Visit: Payer: Self-pay | Admitting: *Deleted

## 2018-12-08 MED ORDER — FUROSEMIDE 40 MG PO TABS: 20.0000 mg | ORAL_TABLET | Freq: Every day | ORAL | 1 refills | Status: DC

## 2018-12-08 NOTE — Patient Outreach (Signed)
[facility] Care Management Chronic Special Needs Program  12/08/2018  Name: KELITA BECHTEL DOB: April 16, 1945  MRN: [id number]  Ms. Mava Biber is enrolled in a chronic special needs plan for Heart Failure. Reviewed and updated care plan.  Subjective: client denies any issues at this time. client reports most recent A1C 5.9. She reports she has had blood sugar less than 70 within the past month and is able to states appropriate treatment plan with candy or juice and repeat check in 15 minutes to make sure her blood sugar is coming up. Client states she contacts her provider office to notify them when her blood sugar is less than 70. She reports she has attended scheduled follow up visits with primary care.  She reports she last saw cardiologist July 2020. She states she has not been weighing self. Denies increase edema or fatigue. However client does reports shortness of breath with activity due to lung problem. Client reports she is scheduled for an MRI this week and has a follow up appointment with pulmonologist scheduled for 01/21/2019. Client also reports she is scheduled to see a nutritionist on 12/28/2018. Client request RNCM send another copy of advanced directive packet.  Goals Addressed            This Visit's Progress   .  Acknowledge receipt of Advanced Directive package   On track   . Advanced Care Planning complete by as directed by client within the next 6 months.   On track   . COMPLETED: Client understands the importance of follow-up with providers by attending scheduled visits       Voiced understanding.    . COMPLETED: Client verbalize knowledge of Heart Failure disease self management skills within the next 6 months.   On track    Voiced taking medications; attending follow up with provider visits; knowing signs/symptoms of worsening.    . Client will report keeping up with daily weights within the next 6 months.   Not on track    Please read  educational material sent in this packet: How to weigh yourself: heart failure keeping track of your weight each day.    . COMPLETED: Client will report no worsening of symptoms of Atrial Fibrillation within the next 6 months       Reports no symptoms.    . COMPLETED: Client will report no worsening of symptoms related to heart disease within the next 6 months        Reports no symptoms.    . Client will verbalize knowledge of chronic lung disease as evidenced by no ED visits or Inpatient stays related to chronic lung disease    On track   . Client will verbalize knowledge of self management of Hypertension as evidences by BP reading of 140/90 or less; or as defined by provider   On track   . COMPLETED: Maintain timely refills of Heart Failure medication as prescribed within the year        Denies any issues.    . Obtain annual  Lipid Profile, LDL-C   On track   . COMPLETED: Visit Primary Care Provider or Cardiologist at least 2 times per year       Done 04/29/2018 and 10/19/2018      RNCM reviewed the importance of keeping tract of weights; reviewed signs/symptoms of heart failure exacerbation. RNCM also reviewed treatment of low blood sugar. RNCM provided positive feedback to client with     Plan: RNCM will send updated  care plan to client; [facility] will send updated care plan to primary care. RNCM will follow up with client within 6 months.   Thea Silversmith, RN, MSN, [place] [phone]    .

## 2018-12-09 ENCOUNTER — Other Ambulatory Visit: Payer: Self-pay

## 2018-12-09 ENCOUNTER — Ambulatory Visit (INDEPENDENT_AMBULATORY_CARE_PROVIDER_SITE_OTHER)
Admission: RE | Admit: 2018-12-09 | Discharge: 2018-12-09 | Disposition: A | Discharge status: Home / Self Care | Payer: HMO | Source: Ambulatory Visit | Attending: Pulmonary Disease | Admitting: Pulmonary Disease

## 2018-12-09 DIAGNOSIS — J84112 Idiopathic pulmonary fibrosis: Secondary | ICD-10-CM

## 2018-12-09 DIAGNOSIS — J849 Interstitial pulmonary disease, unspecified: Secondary | ICD-10-CM | POA: Diagnosis not present

## 2018-12-09 DIAGNOSIS — J8410 Pulmonary fibrosis, unspecified: Secondary | ICD-10-CM | POA: Diagnosis not present

## 2018-12-09 NOTE — Progress Notes (Signed)
High-resolution CT chest is stable in comparison to 2017 CT results.  No further recommendations or changes.  Keep scheduled follow-up with Dr. Vaughan Browner to establish care with him.  Continue taking Esbriet.  Patient still needs to be scheduled for pulmonary function testing.  This was ordered in August.  It does not appear the patient has been scheduled yet.  This may need to be completed on a separate day so that way patient can keep already scheduled appointment with Dr. Vaughan Browner.  Wyn Quaker, FNP

## 2018-12-12 ENCOUNTER — Telehealth: Payer: Self-pay | Admitting: *Deleted

## 2018-12-12 NOTE — Telephone Encounter (Signed)
Notes recorded by Stephanie Coup, CMA on 12/12/2018 at 10:59 AM EDT  LMTCB in effort to schedule PFT  1st available 11/3 at or 11/4 at 4.

## 2018-12-12 NOTE — Telephone Encounter (Signed)
PFT has been scheduled for 11/3 Nothing further needed.

## 2018-12-21 ENCOUNTER — Other Ambulatory Visit: Payer: Self-pay

## 2018-12-21 ENCOUNTER — Ambulatory Visit: Payer: HMO | Admitting: Pulmonary Disease

## 2018-12-21 ENCOUNTER — Encounter: Payer: Self-pay | Admitting: Pulmonary Disease

## 2018-12-21 VITALS — BP 132/80 | HR 68 | Temp 97.3°F | Ht 65.0 in | Wt 200.4 lb

## 2018-12-21 DIAGNOSIS — J849 Interstitial pulmonary disease, unspecified: Secondary | ICD-10-CM | POA: Diagnosis not present

## 2018-12-21 NOTE — Progress Notes (Addendum)
SHERION KECKLER    [id number]    14-Sep-1945  Primary Care Physician:Hernandez Everardo Beals, MD  Referring Physician: [name], [name], MD [address] [place],  [place] [address]  Chief complaint: Follow-up for interstitial lung disease, OSA  HPI: Mrs. Stroble is here for follow-up of obstructive sleep apnea, pulmonary fibrosis She has history of UIP fibrosis.  There is history of rheumatoid arthritis in chart but connective tissue serologies are negative with no arthritis, joint pain symptoms.  Pulmonary fibrosis is felt to be more consistent with IPF and she has been on Esbriet since 2019  Symptoms of dyspnea on exertion is stable.  No new complaints today.  Pets: Has a dog.  No cats, birds, farm animals Occupation: Retired Quarry manager Exposures: Has feather pillows at home but not in her bedroom.  No mold, hot tub, Jacuzzi Smoking history: Never smoker Travel history: No significant travel history Relevant family history: No significant family history of lung disease  Outpatient Encounter Medications as of 12/21/2018  Medication Sig  . albuterol (PROVENTIL HFA;VENTOLIN HFA) 108 (90 Base) MCG/ACT inhaler Inhale 1 puff into the lungs every 6 (six) hours as needed.   . ALPRAZolam (XANAX) 0.25 MG tablet TAKE 1 TABLET BY MOUTH THREE TIMES DAILY AS NEEDED FOR ANXIETY FOR SLEEP  . amLODipine (NORVASC) 10 MG tablet Take 1 tablet by mouth once daily  . atorvastatin (LIPITOR) 80 MG tablet Take 1 tablet by mouth once daily  . Continuous Blood Gluc Receiver (FREESTYLE LIBRE 14 DAY READER) DEVI 1 each by Does not apply route daily.  . Continuous Blood Gluc Sensor (FREESTYLE LIBRE 14 DAY SENSOR) MISC 2 each by Does not apply route daily.  . cyclobenzaprine (FLEXERIL) 5 MG tablet TAKE 1 TABLET BY MOUTH TWICE DAILY AS NEEDED FOR MUSCLE SPASM  . diclofenac sodium (VOLTAREN) 1 % GEL Apply 2 g topically 4 (four) times daily.  Marland Kitchen docusate sodium (COLACE) 100 MG capsule Take 1  capsule (100 mg total) by mouth 2 (two) times daily.  Marland Kitchen dronedarone (MULTAQ) 400 MG tablet TAKE 1 TABLET BY MOUTH TWICE DAILY WITH A MEAL  . ESBRIET 801 MG TABS TAKE 1 TABLET BY MOUTH THREE TIMES A DAY WITH MEALS. PROTECT FROM SUNBURN. CALL [phone] TO REFILL  . furosemide (LASIX) 40 MG tablet Take 0.5 tablets (20 mg total) by mouth daily.  . hydrochlorothiazide (HYDRODIURIL) 25 MG tablet Take 1 tablet (25 mg total) by mouth daily.  Marland Kitchen HYDROcodone-acetaminophen (NORCO) 10-325 MG tablet Take 1 tablet by mouth every 6 (six) hours as needed.  . lactulose (CONSTULOSE) 10 GM/15ML solution TAKE 30 MILLILITERS THREE TIMES DAILY BY  MOUTH AS NEEDED FOR MILD CONSTIPATION  . losartan (COZAAR) 100 MG tablet Take 1 tablet (100 mg total) by mouth daily.  . metFORMIN (GLUCOPHAGE-XR) 500 MG 24 hr tablet Take 2 tablets (1,000 mg total) by mouth daily with breakfast.  . polyethylene glycol powder (GLYCOLAX/MIRALAX) powder Take 17 g by mouth daily.  . rivaroxaban (XARELTO) 20 MG TABS tablet Take 1 tablet (20 mg total) by mouth daily with supper.   No facility-administered encounter medications on file as of 12/21/2018.     Allergies as of 12/21/2018 - Review Complete 12/21/2018  Allergen Reaction Noted  . Fish oil Anaphylaxis 04/30/2009  . Other Hives, Shortness Of Breath, and Swelling 03/18/2015  . Penicillins Anaphylaxis, Hives, and Swelling 04/29/2010  . Pneumococcal vaccines Nausea And Vomiting 03/18/2015  . Cashew nut oil  10/10/2018  .  Nitrofurantoin macrocrystal Hives 07/14/2017  . Peanut oil  10/10/2018  . Aspirin Itching and Rash 04/29/2010  . Bactrim [sulfamethoxazole-trimethoprim] Hives, Itching, and Rash 08/18/2011  . Ciprofloxacin Hives, Itching, and Rash 07/06/2011  . Ibuprofen Rash 04/29/2010  . Influenza vaccines Hives 09/14/2011  . Ivp dye [iodinated diagnostic agents] Hives, Itching, and Rash 02/06/2011  . Latex Rash 10/01/2011  . Macrobid [nitrofurantoin monohydrate macrocrystals]  Hives 07/13/2011  . Shellfish allergy Hives 09/14/2011    Past Medical History:  Diagnosis Date  . Anxiety   . Arthritis   . Asthma   . Atrial fibrillation ([place])   . CHF (congestive heart failure) ([place])    pt. unsure- but thinks she was hosp. for CHF- 2002  . [facility]    [facility]  . Clotting disorder ([place])    blood clots in lungs/PE pulmonary embolism  . Colon polyps   . Complication of anesthesia    states requires a lot med. to put her to sleep   . DDD (degenerative disc disease) 09/17/2011  . Depression    "sometimes "  . DM (diabetes mellitus) ([place])   . Family history of anesthesia complication   . GERD (gastroesophageal reflux disease)   . Glaucoma    bilateral, pt. admits that she is noncompliant to eye gtts.   . Hemorrhoids   . Hyperlipidemia   . Hypertension    had stress, echo- 2006 /w [place], Cardiac Cath, per pt. 2002, echo repeated 2012- wnl   . Low back pain   . Shortness of breath   . Sleep apnea    uses c-pap- q night recently    Past Surgical History:  Procedure Laterality Date  . ABDOMINAL HYSTERECTOMY     ectopic, fibroids  . arm surgery Right   . CARDIAC CATHETERIZATION    . CATARACT EXTRACTION, BILATERAL     cataracts removed bilateral- ?IOL  . COLONOSCOPY     remote  . FISSURECTOMY  10/08/2011   Procedure: FISSURECTOMY;  Surgeon: Stark Klein, MD;  Location: [facility];  Service: General;  Laterality: N/A;  . FLEXIBLE SIGMOIDOSCOPY  02/25/2011   Procedure: FLEXIBLE SIGMOIDOSCOPY;  Surgeon: Inda Castle, MD;  Location: [facility];  Service: Endoscopy;  Laterality: N/A;  . FOOT SURGERY     bilat, heel spurs- screw in R foot   . HEMORRHOID SURGERY  10/08/2011   Procedure: HEMORRHOIDECTOMY;  Surgeon: Stark Klein, MD;  Location: [facility];  Service: General;  Laterality: N/A;  External   . SPHINCTEROTOMY  10/08/2011   Procedure: Joan Mayans;  Surgeon: Stark Klein, MD;  Location: [facility];  Service: General;  Laterality:  N/A;    Family History  Problem Relation Age of Onset  . Heart attack Mother   . Heart disease Mother   . Breast cancer Mother   . Emphysema Sister   . Breast cancer Sister   . Arthritis/Rheumatoid Sister   . Asthma Sister   . Lung cancer Sister   . COPD Sister   . Colon cancer Brother   . Colon cancer Brother   . Anesthesia problems Neg Hx     Social History   Socioeconomic History  . Marital status: Married    Spouse name: Not on file  . Number of children: 4  . Years of education: Not on file  . Highest education level: Not on file  Occupational History  . Occupation: retired Forensic psychologist: UNEMPLOYED  Social Needs  . Financial resource strain: Not  on file  . Food insecurity    Worry: Not on file    Inability: Not on file  . Transportation needs    Medical: Not on file    Non-medical: Not on file  Tobacco Use  . Smoking status: Never Smoker  . Smokeless tobacco: Never Used  Substance and Sexual Activity  . Alcohol use: No    Alcohol/week: 0.0 standard drinks  . Drug use: No  . Sexual activity: Never  Lifestyle  . Physical activity    Days per week: Not on file    Minutes per session: Not on file  . Stress: Not on file  Relationships  . Social Herbalist on phone: Not on file    Gets together: Not on file    Attends religious service: Not on file    Active member of club or organization: Not on file    Attends meetings of clubs or organizations: Not on file    Relationship status: Not on file  . Intimate partner violence    Fear of current or ex partner: Not on file    Emotionally abused: Not on file    Physically abused: Not on file    Forced sexual activity: Not on file  Other Topics Concern  . Not on file  Social History Narrative  . Not on file    Review of systems: Review of Systems  Constitutional: Negative for fever and chills.  HENT: Negative.   Eyes: Negative for blurred vision.  Respiratory: as per HPI   Cardiovascular: Negative for chest pain and palpitations.  Gastrointestinal: Negative for vomiting, diarrhea, blood per rectum. Genitourinary: Negative for dysuria, urgency, frequency and hematuria.  Musculoskeletal: Negative for myalgias, back pain and joint pain.  Skin: Negative for itching and rash.  Neurological: Negative for dizziness, tremors, focal weakness, seizures and loss of consciousness.  Endo/Heme/Allergies: Negative for environmental allergies.  Psychiatric/Behavioral: Negative for depression, suicidal ideas and hallucinations.  All other systems reviewed and are negative.  Physical Exam: Blood pressure 132/80, pulse 68, temperature (!) 97.3 F (36.3 C), temperature source Temporal, height 5\' 5"  (1.651 m), weight 200 lb 6.4 oz (90.9 kg), SpO2 98 %. Gen:      No acute distress HEENT:  EOMI, sclera anicteric Neck:     No masses; no thyromegaly Lungs:    Clear to auscultation bilaterally; normal respiratory effort CV:         Regular rate and rhythm; no murmurs Abd:      + bowel sounds; soft, non-tender; no palpable masses, no distension Ext:    No edema; adequate peripheral perfusion Skin:      Warm and dry; no rash Neuro: alert and oriented x 3 Psych: normal mood and affect  Data Reviewed: Imaging: High-res CT 12/09/2018- mild pulmonary fibrosis and basilar predominance.  Traction bronchiectasis, honeycombing.  Unchanged compared to 2017 I have reviewed all images personally  PFTs: 06/14/2017 FVC 2.21 [89%], FEV1 1.88 [98%],/55, TLC 3.68 [70%], DLCO 14.69 [57%] Mild restriction with moderate diffusion impairment.  Stable compared to 2017  Labs: CTD serologies including CCP, rheumatoid factor 2017, 2018, 2019-negative Comprehensive metabolic panel [id number]- normal.  Sleep study: 10/2016 PSG> AHI 10.5   CPAP download 12/19/2018-77% compliance.  Residual AHI 2  Assessment:  Pulmonary fibrosis Although she has history of rheumatoid arthritis in the chart she does  not have any symptoms.  And has negative serologies.  Presentation is consistent with IPF Her lung function and CT scan have remained  stable on Esbriet.  Recent LFTs are normal Continue current therapy  Obstructive sleep apnea Encouraged her to use her CPAP every day as there are days in which she has been noncompliant.  Health maintenance Discussed flu and pneumonia vaccine.  Apparently she been told by her primary care that she is allergic to vaccination and advised against it. Will send message to primary care to clarify.  Plan/Recommendations: - Continue Esbriet, CPAP  [name] MD [facility] 12/21/2018, 9:32 AM  CC: [name], [name]

## 2018-12-21 NOTE — Patient Instructions (Addendum)
Glad you are doing well with regard to your breathing Your CT scan shows stable lung function  Follow-up in 6 months with spirometry diffusion capacity and 6-minute walk test

## 2018-12-22 NOTE — Telephone Encounter (Signed)
pft an f/u scheduled for 01/17/2019-pr

## 2018-12-28 ENCOUNTER — Other Ambulatory Visit: Payer: Self-pay

## 2018-12-28 ENCOUNTER — Encounter: Payer: HMO | Attending: Internal Medicine | Admitting: *Deleted

## 2018-12-28 DIAGNOSIS — E119 Type 2 diabetes mellitus without complications: Secondary | ICD-10-CM | POA: Diagnosis not present

## 2018-12-28 NOTE — Patient Instructions (Signed)
Plan:   Aim for 3 Carb Choices per meal (45 grams) +/- 1 either way   This will help stabilize your blood sugars and prevent them from going too low  Aim for 0-1 Carbs per snack if hungry   Include protein in moderation with your meals and snacks  Consider using just 4 oz regular soda or juice to treat any low blood sugars you might have. More than that will make your blood sugars go too high  Continue checking BG at alternate times per day with your Elenor Legato  Continue taking medication Metformin as directed by MD and see if they might suggest decreasing to 500 mg if you continue to have low blood sugars

## 2019-01-02 ENCOUNTER — Other Ambulatory Visit: Payer: Self-pay | Admitting: Internal Medicine

## 2019-01-02 DIAGNOSIS — F419 Anxiety disorder, unspecified: Secondary | ICD-10-CM

## 2019-01-02 NOTE — Progress Notes (Signed)
Diabetes Self-Management Education  Visit Type: First/Initial  Appt. Start Time: 1100 Appt. End Time: 1230  01/02/2019  Ms. Miranda [name], identified by name and date of birth, is a 73 y.o. female with a diagnosis of Diabetes: Type 2. She states history of Diabetes for a couple of years and so far her A1c has been under 6.5%. She would like to know what the Target blood sugar ranges are with Diabetes and better understand her food choices. She does state she has occasional low blood sugars. Her current medication Rx for Diabetes is Metformin @ 1000 mg at breakfast meal.  ASSESSMENT  There were no vitals taken for this visit. There is no height or weight on file to calculate BMI.  Diabetes Self-Management Education - 12/28/18 1136      Visit Information   Visit Type  First/Initial      Initial Visit   Diabetes Type  Type 2    Are you currently following a meal plan?  No    Are you taking your medications as prescribed?  Yes    Date Diagnosed  2 years ago      Psychosocial Assessment   Patient Belief/Attitude about Diabetes  Motivated to manage diabetes    Self-care barriers  None    Self-management support  Doctor's office    Other persons present  Patient    Patient Concerns  Nutrition/Meal planning;Medication;Glycemic Control;Problem Solving    Special Needs  None    Preferred Learning Style  Auditory;Visual;Hands on    [place] in progress    What is the last grade level you completed in school?  10th      Pre-Education Assessment   Patient understands the diabetes disease and treatment process.  Needs Review    Patient understands incorporating nutritional management into lifestyle.  Needs Instruction    Patient undertands incorporating physical activity into lifestyle.  Needs Review    Patient understands using medications safely.  Needs Review    Patient understands monitoring blood glucose, interpreting and using results  Demonstrates understanding /  competency    Patient understands prevention, detection, and treatment of acute complications.  Needs Review    Patient understands prevention, detection, and treatment of chronic complications.  Demonstrates understanding / competency      Complications   Last HgB A1C per patient/outside source  6 %    How often do you check your blood sugar?  3-4 times/day   has Libre   Fasting Blood glucose range (mg/dL)  70-129    Postprandial Blood glucose range (mg/dL)  70-129;130-179    Number of hypoglycemic episodes per month  2    Have you had a dilated eye exam in the past 12 months?  Yes    Have you had a dental exam in the past 12 months?  No    Are you checking your feet?  No      Dietary Intake   Breakfast  10:30 AM: cereal OR eggs and toast OR flavored oatmeal    Lunch  2 PM: left overs OR sandwich occasionally with chips or fries    Snack (afternoon)  chips, skins,    Dinner  meat, starch, vegetables, bread often    Beverage(s)  coffee with 3 tsp sugar and cream, regular soda, water      Exercise   Exercise Type  ADL's    How many days per week to you exercise?  0      Patient Education  Previous Diabetes Education  No    Disease state   Explored patient's options for treatment of their diabetes    Nutrition management   Carbohydrate counting;Reviewed blood glucose goals for pre and post meals and how to evaluate the patients' food intake on their blood glucose level.;Role of diet in the treatment of diabetes and the relationship between the three main macronutrients and blood glucose level    Physical activity and exercise   Role of exercise on diabetes management, blood pressure control and cardiac health.;Helped patient identify appropriate exercises in relation to his/her diabetes, diabetes complications and other health issue.    Medications  Reviewed patients medication for diabetes, action, purpose, timing of dose and side effects.    Monitoring  Identified appropriate SMBG  and/or A1C goals.    Acute complications  Taught treatment of hypoglycemia - the 15 rule.    Chronic complications  Relationship between chronic complications and blood glucose control    Psychosocial adjustment  Role of stress on diabetes      Individualized Goals (developed by patient)   Nutrition  General guidelines for healthy choices and portions discussed    Physical Activity  Exercise 3-5 times per week    Medications  take my medication as prescribed    Monitoring   test blood glucose pre and post meals as discussed    Problem Solving  have food before increasing activity level to help prevent low blood sugars      Post-Education Assessment   Patient understands the diabetes disease and treatment process.  Demonstrates understanding / competency    Patient understands incorporating nutritional management into lifestyle.  Demonstrates understanding / competency    Patient undertands incorporating physical activity into lifestyle.  Demonstrates understanding / competency    Patient understands using medications safely.  Demonstrates understanding / competency    Patient understands monitoring blood glucose, interpreting and using results  Demonstrates understanding / competency    Patient understands prevention, detection, and treatment of acute complications.  Demonstrates understanding / competency    Patient understands prevention, detection, and treatment of chronic complications.  Demonstrates understanding / competency    Patient understands how to develop strategies to address psychosocial issues.  Demonstrates understanding / competency    Patient understands how to develop strategies to promote health/change behavior.  Demonstrates understanding / competency      Outcomes   Expected Outcomes  Demonstrated interest in learning. Expect positive outcomes    Future DMSE  PRN    Program Status  Not Completed       Individualized Plan for Diabetes Self-Management Training:    Learning Objective:  Patient will have a greater understanding of diabetes self-management. Patient education plan is to attend individual and/or group sessions per assessed needs and concerns.   Plan:   Patient Instructions  Plan:   Aim for 3 Carb Choices per meal (45 grams) +/- 1 either way   This will help stabilize your blood sugars and prevent them from going too low  Aim for 0-1 Carbs per snack if hungry   Include protein in moderation with your meals and snacks  Consider using just 4 oz regular soda or juice to treat any low blood sugars you might have. More than that will make your blood sugars go too high  Continue checking BG at alternate times per day with your Elenor Legato  Continue taking medication Metformin as directed by MD and see if they might suggest decreasing to 500 mg if you  continue to have low blood sugars  Expected Outcomes:  Demonstrated interest in learning. Expect positive outcomes  Education material provided: A1C conversion sheet, Meal plan card and Carbohydrate counting sheet, Hypoglycemia education handout  If problems or questions, patient to contact team via:  Phone  Future DSME appointment: PRN

## 2019-01-12 ENCOUNTER — Ambulatory Visit: Payer: Self-pay | Admitting: Pharmacist

## 2019-01-12 ENCOUNTER — Other Ambulatory Visit: Payer: Self-pay | Admitting: Pharmacist

## 2019-01-12 NOTE — Patient Outreach (Signed)
[facility]) Care Management  01/12/2019  NUHAMIN LANI 02/24/1946 [id number]   Patient was called for CSNP follow up. Unfortunately, she did not answer her phone. HIPAA compliant message was left on her voicemail.  Chart review revealed HgA1c- 5.9% 10/19/2018  Plan: Send patient an unsuccessful outreach letter. Call patient back in 3 months.  Elayne Guerin, PharmD, [place] Clinical Pharmacist [phone]

## 2019-01-14 ENCOUNTER — Other Ambulatory Visit (HOSPITAL_COMMUNITY)
Admission: RE | Admit: 2019-01-14 | Discharge: 2019-01-14 | Disposition: A | Discharge status: Home / Self Care | Payer: HMO | Source: Ambulatory Visit | Attending: Primary Care | Admitting: Primary Care

## 2019-01-14 DIAGNOSIS — Z01812 Encounter for preprocedural laboratory examination: Secondary | ICD-10-CM | POA: Insufficient documentation

## 2019-01-14 DIAGNOSIS — Z20828 Contact with and (suspected) exposure to other viral communicable diseases: Secondary | ICD-10-CM | POA: Diagnosis not present

## 2019-01-15 LAB — NOVEL CORONAVIRUS, NAA (HOSP ORDER, SEND-OUT TO REF LAB; TAT 18-24 HRS): SARS-CoV-2, NAA: NOT DETECTED

## 2019-01-16 ENCOUNTER — Telehealth: Payer: Self-pay | Admitting: Primary Care

## 2019-01-16 NOTE — Telephone Encounter (Signed)
Call returned to patient, confirmed DOB, made aware covid results were negative. Confirmed OV and PFT appt tomorrow. Voiced understanding. Nothing further needed at this time.

## 2019-01-17 ENCOUNTER — Other Ambulatory Visit: Payer: Self-pay

## 2019-01-17 ENCOUNTER — Ambulatory Visit (INDEPENDENT_AMBULATORY_CARE_PROVIDER_SITE_OTHER): Payer: HMO | Admitting: Pulmonary Disease

## 2019-01-17 ENCOUNTER — Ambulatory Visit (INDEPENDENT_AMBULATORY_CARE_PROVIDER_SITE_OTHER): Payer: HMO | Admitting: Primary Care

## 2019-01-17 DIAGNOSIS — J849 Interstitial pulmonary disease, unspecified: Secondary | ICD-10-CM

## 2019-01-17 DIAGNOSIS — J84112 Idiopathic pulmonary fibrosis: Secondary | ICD-10-CM

## 2019-01-17 LAB — PULMONARY FUNCTION TEST
DL/VA % pred: 110 %
DL/VA: 4.5 ml/min/mmHg/L
DLCO unc % pred: 84 %
DLCO unc: 16.93 ml/min/mmHg
FEF 25-75 Post: 2.71 L/sec
FEF 25-75 Pre: 2.28 L/sec
FEF2575-%Change-Post: 18 %
FEF2575-%Pred-Post: 160 %
FEF2575-%Pred-Pre: 135 %
FEV1-%Change-Post: 3 %
FEV1-%Pred-Post: 97 %
FEV1-%Pred-Pre: 94 %
FEV1-Post: 1.83 L
FEV1-Pre: 1.78 L
FEV1FVC-%Change-Post: 1 %
FEV1FVC-%Pred-Pre: 110 %
FEV6-%Change-Post: 2 %
FEV6-%Pred-Post: 90 %
FEV6-%Pred-Pre: 89 %
FEV6-Post: 2.11 L
FEV6-Pre: 2.07 L
FEV6FVC-%Change-Post: 0 %
FEV6FVC-%Pred-Post: 103 %
FEV6FVC-%Pred-Pre: 103 %
FVC-%Change-Post: 1 %
FVC-%Pred-Post: 87 %
FVC-%Pred-Pre: 86 %
FVC-Post: 2.12 L
FVC-Pre: 2.08 L
Post FEV1/FVC ratio: 87 %
Post FEV6/FVC ratio: 100 %
Pre FEV1/FVC ratio: 85 %
Pre FEV6/FVC Ratio: 99 %
RV % pred: 75 %
RV: 1.71 L
TLC % pred: 74 %
TLC: 3.88 L

## 2019-01-17 NOTE — Progress Notes (Signed)
PFT done today. 

## 2019-01-17 NOTE — Progress Notes (Signed)
Patient was just seen by Dr. Vaughan Browner in October and doing well. Recommended follow-up in 6 months with spirometry. Patient had PFTs today. She feels well and office visit canceled for today and will follow-up in April.

## 2019-02-07 ENCOUNTER — Other Ambulatory Visit: Payer: Self-pay | Admitting: Internal Medicine

## 2019-02-07 DIAGNOSIS — F419 Anxiety disorder, unspecified: Secondary | ICD-10-CM

## 2019-02-08 ENCOUNTER — Other Ambulatory Visit: Payer: Self-pay | Admitting: Internal Medicine

## 2019-02-08 ENCOUNTER — Telehealth: Payer: Self-pay | Admitting: *Deleted

## 2019-02-08 DIAGNOSIS — M19012 Primary osteoarthritis, left shoulder: Secondary | ICD-10-CM

## 2019-02-08 DIAGNOSIS — G8929 Other chronic pain: Secondary | ICD-10-CM

## 2019-02-08 DIAGNOSIS — M545 Low back pain, unspecified: Secondary | ICD-10-CM

## 2019-02-08 MED ORDER — HYDROCODONE-ACETAMINOPHEN 10-325 MG PO TABS: 1.0000 | ORAL_TABLET | Freq: Four times a day (QID) | ORAL | 0 refills | Status: DC | PRN

## 2019-02-08 NOTE — Telephone Encounter (Signed)
Routing to PCP for approval on Xanax

## 2019-02-08 NOTE — Telephone Encounter (Signed)
Copied from [place] [id number] [phone]. Topic: Quick Communication - Rx Refill/Question >> Feb 08, 2019 11:52 AM Carolyn Stare wrote: Medication HYDROcodone-acetaminophen (NORCO) 10-325 MG tablet    pt said she is out of  metformin   Preferred Pharmacy [facility] [address]   Agent: Please be advised that RX refills may take up to 3 business days. We ask that you follow-up with your pharmacy.

## 2019-02-08 NOTE — Telephone Encounter (Signed)
Metformin was refilled earlier today. Will send one hydrocodone refills. Further will need visit.

## 2019-02-12 ENCOUNTER — Other Ambulatory Visit: Payer: Self-pay | Admitting: Internal Medicine

## 2019-02-13 NOTE — Telephone Encounter (Signed)
Called patient. No answer. LVM for patient to let her know that Per Dr.Hernandez Metformin and Hydrocodone were sent in. If she needs further refills on Hydrocodone she will need an OV.

## 2019-03-03 ENCOUNTER — Telehealth: Payer: Self-pay | Admitting: Internal Medicine

## 2019-03-03 DIAGNOSIS — F419 Anxiety disorder, unspecified: Secondary | ICD-10-CM

## 2019-03-03 DIAGNOSIS — M545 Low back pain, unspecified: Secondary | ICD-10-CM

## 2019-03-03 DIAGNOSIS — M19012 Primary osteoarthritis, left shoulder: Secondary | ICD-10-CM

## 2019-03-03 DIAGNOSIS — G8929 Other chronic pain: Secondary | ICD-10-CM

## 2019-03-03 NOTE — Telephone Encounter (Signed)
Medication Refill - Medication:  ALPRAZolam (XANAX) 0.25 MG tablet  HYDROcodone-acetaminophen (NORCO) 10-325 MG tablet   Has the patient contacted their pharmacy? Yes advised to call office. Please call pt when filled.  Preferred Pharmacy (with phone number or street name):  [facility] [place] [place] Phone:  [phone]  Fax:  [phone]     Agent: Please be advised that RX refills may take up to 3 business days. We ask that you follow-up with your pharmacy.

## 2019-03-03 NOTE — Telephone Encounter (Signed)
Requested medication (s) are due for refill today: yes  Requested medication (s) are on the active medication list: yes  Last refill:  02/08/2019  Future visit scheduled: no  Notes to clinic: refill cannot be delegated    Requested Prescriptions  Pending Prescriptions Disp Refills   ALPRAZolam (XANAX) 0.25 MG tablet 15 tablet 0      Not Delegated - Psychiatry:  Anxiolytics/Hypnotics Failed - 03/03/2019  9:45 AM      Failed - This refill cannot be delegated      Failed - Urine Drug Screen completed in last 360 days.      Passed - Valid encounter within last 6 months    Recent Outpatient Visits           4 months ago DM type 2 with diabetic mixed hyperlipidemia ([place])   [place] at [facility], [name], MD   6 months ago Chronic low back pain without sciatica, unspecified back pain laterality   [place] at [facility], [name], MD   8 months ago Drug-induced constipation   [facility] at [facility], [name], MD   10 months ago Dyslipidemia   [facility]rap[facility], [facility] at [place], MD   1 year ago Dyslipidemia   [place] at [facility], [name], MD                HYDROcodone-acetaminophen (NORCO) 10-325 MG tablet 90 tablet 0    Sig: Take 1 tablet by mouth every 6 (six) hours as needed.      Not Delegated - Analgesics:  Opioid Agonist Combinations Failed - 03/03/2019  9:45 AM      Failed - This refill cannot be delegated      Failed - Urine Drug Screen completed in last 360 days.      Passed - Valid encounter within last 6 months    Recent Outpatient Visits           4 months ago DM type 2 with diabetic mixed hyperlipidemia ([place])   [place] at [facility], [name], MD   6 months ago Chronic low back pain without sciatica, unspecified back pain laterality   [facility], [facility] at [facility], [name], MD   8 months ago Drug-induced constipation   [facility], [facility] at [facility], [name], MD   10 months ago Dyslipidemia   Therapist, music at [place], MD   1 year ago Dyslipidemia   [place] at [facility], [name], MD

## 2019-03-06 ENCOUNTER — Other Ambulatory Visit: Payer: Self-pay | Admitting: *Deleted

## 2019-03-06 DIAGNOSIS — F419 Anxiety disorder, unspecified: Secondary | ICD-10-CM

## 2019-03-06 DIAGNOSIS — M545 Low back pain, unspecified: Secondary | ICD-10-CM

## 2019-03-06 DIAGNOSIS — M19012 Primary osteoarthritis, left shoulder: Secondary | ICD-10-CM

## 2019-03-06 DIAGNOSIS — G8929 Other chronic pain: Secondary | ICD-10-CM

## 2019-03-06 NOTE — Telephone Encounter (Signed)
Message Routed to PCP CMA 

## 2019-03-06 NOTE — Telephone Encounter (Signed)
Pt called back at 4:19 saying she took her last dose of both both medications today and needs a refill asap  CB#  [phone]

## 2019-03-06 NOTE — Telephone Encounter (Signed)
Patient called office to schedule visit for medication refills. PCP is unavailable until 03/21/19. Patient stated she is out of medication and cannot wait that long for medications because she possibly could have withdrawals. Patient needing refills of Hydrocodone and Xanax. Please advise.

## 2019-03-06 NOTE — Telephone Encounter (Signed)
Pt. Reports she needs her Xanax and hydrocodone refills. Reports she is completely out of her Xanax "and my brother is dying and I really need it." Please advise pt.

## 2019-03-07 MED ORDER — ALPRAZOLAM 0.25 MG PO TABS: 0.2500 mg | ORAL_TABLET | Freq: Three times a day (TID) | ORAL | 0 refills | Status: DC | PRN

## 2019-03-07 MED ORDER — HYDROCODONE-ACETAMINOPHEN 10-325 MG PO TABS: 1.0000 | ORAL_TABLET | Freq: Four times a day (QID) | ORAL | 0 refills | Status: DC | PRN

## 2019-03-07 NOTE — Telephone Encounter (Signed)
Rx sent 

## 2019-03-07 NOTE — Telephone Encounter (Signed)
Patient has scheduled an appt. For 01/05 and is out of all of her medication.  She would like it filled as soon as possible to hold her until her appt. Please advise and send to pharmacy asap.

## 2019-03-07 NOTE — Addendum Note (Signed)
Addended by: Westley Hummer B on: 03/07/2019 09:01 AM   Modules accepted: Orders

## 2019-03-07 NOTE — Telephone Encounter (Signed)
OK to refill only enough until next visit. In future, cannot wait until meds are done before scheduling follow up.  [place]

## 2019-03-08 ENCOUNTER — Other Ambulatory Visit: Payer: Self-pay | Admitting: Internal Medicine

## 2019-03-08 DIAGNOSIS — I10 Essential (primary) hypertension: Secondary | ICD-10-CM

## 2019-03-11 ENCOUNTER — Other Ambulatory Visit: Payer: Self-pay | Admitting: Internal Medicine

## 2019-03-11 DIAGNOSIS — I480 Paroxysmal atrial fibrillation: Secondary | ICD-10-CM

## 2019-03-16 ENCOUNTER — Other Ambulatory Visit: Payer: Self-pay | Admitting: Internal Medicine

## 2019-03-21 ENCOUNTER — Telehealth: Payer: HMO | Admitting: Internal Medicine

## 2019-03-27 ENCOUNTER — Telehealth: Payer: Self-pay | Admitting: Internal Medicine

## 2019-03-27 NOTE — Telephone Encounter (Signed)
Pt needs to know when to stop/resume her xarelto.  Miranda [name] [phone]  Fax [phone]  Katie aware Dr Jerilee Hoh is off on Mondays.

## 2019-03-27 NOTE — Telephone Encounter (Signed)
Message Routed to PCP CMA 

## 2019-03-27 NOTE — Telephone Encounter (Signed)
Pt stated she is supposed to have oral surgery and because she is on a blood thinner they need to have permission from Dr. Jerilee Hoh before they can give her anesthesia. Dr. Romie Minus at [facility] oral, [facility] [place] [phone]. They want to get her scheduled as soon as possible to have two teeth pulled due to pain

## 2019-03-28 ENCOUNTER — Telehealth (INDEPENDENT_AMBULATORY_CARE_PROVIDER_SITE_OTHER): Payer: HMO | Admitting: Internal Medicine

## 2019-03-28 ENCOUNTER — Other Ambulatory Visit: Payer: Self-pay

## 2019-03-28 DIAGNOSIS — I4891 Unspecified atrial fibrillation: Secondary | ICD-10-CM | POA: Diagnosis not present

## 2019-03-28 DIAGNOSIS — M19012 Primary osteoarthritis, left shoulder: Secondary | ICD-10-CM

## 2019-03-28 DIAGNOSIS — M545 Low back pain: Secondary | ICD-10-CM

## 2019-03-28 DIAGNOSIS — G8929 Other chronic pain: Secondary | ICD-10-CM | POA: Diagnosis not present

## 2019-03-28 MED ORDER — HYDROCODONE-ACETAMINOPHEN 10-325 MG PO TABS: 1.0000 | ORAL_TABLET | Freq: Four times a day (QID) | ORAL | 0 refills | Status: DC | PRN

## 2019-03-28 MED ORDER — HYDROCODONE-ACETAMINOPHEN 10-325 MG PO TABS: 1.0000 | ORAL_TABLET | Freq: Three times a day (TID) | ORAL | 0 refills | Status: DC | PRN

## 2019-03-28 NOTE — Patient Outreach (Addendum)
[facility]) Care Management Chronic Special Needs Program  03/28/2019  Name: Miranda [name] DOB: 1945/04/20  MRN: [id number]  Ms. Miranda [name] is enrolled in a chronic special needs plan for Heart Failure. Current client-Chronic Care Management Coordinator telephoned client to review health risk assessment and to develop individualized care plan.  Introduced the chronic care management program, importance of client participation, and taking their care plan to all provider appointments and inpatient facilities.   Subjective: client reports history of CHF, DM, HD, lung disease, HTN, PAF. She states she is doing well. She states reports history of heart failure for the past one -two years. She is not clear of exact year of diagnosis of heart failure. She reports her cardiologist is Dr. Ocie [name] [facility]. She states she has scales and weighs about three times/week. She states if she gaines more than about 5 pounds in a week to call the doctor. She states she does not record weights. She denies increased shortness of breath, swelling or weight gains greater than three pounds overnight or 5 pounds in a week. Client reports she limits salt intake and denies any issues with fluid volume overload. Medications: furosemide,   Client reports a history of diabetes. Last A1C 5.9 on 10/19/2018. Client reports blood sugar less than 70 on occasion. She reports about two weeks ago blood sugar was 62. She states she can tell when her blood sugar level drops below 70 (tired, no energy and weak). She reports she will drink juice, eat candy and recheck her blood sugar in 15 minutes to make sure it has come up. Discussed treatment for hypoglycemia. RNCM reinforced the importance of keeping primary care provider updated if she has low blood sugars. Client states she calls her primary care to notify when her blood sugars are low.  Goals Addressed            This Visit's Progress   .  Acknowledge  receipt of Advanced Directive package   On track   . Client understands the importance of follow-up with providers by attending scheduled visits   On track   . Client will report keeping up with daily weights within the next 6 months.   On track    You will be receiving a [place] which you find helpful in keeping track or your weights.    . Client will verbalize knowledge of chronic lung disease as evidenced by no ED visits or Inpatient stays related to chronic lung disease    On track   . Client will verbalize knowledge of self management of Hypertension as evidences by BP reading of 140/90 or less; or as defined by provider   On track   . Maintain timely refills of Heart Failure medication as prescribed within the year    On track   . Obtain annual  Lipid Profile, LDL-C   On track   . Visit Primary Care Provider or Cardiologist at least 2 times per year   On track     Client is scheduled for a telehealth visit with primary care today.   Signs and symptoms of heart failure exacerbation discussed: increased shortness of breath, increased swelling or edema of hands, stomach, feet or legs, increased weight gain 3 pound overnight or 5 pounds in a week, increased fatigue. Discussed heart failure action plan and when to call the doctor.    Discussed Covid 19 precautions: encouraged client to continue to wear face mask; wait at least 6  ft distance and handwashing.  [facility] encouraged client to call the 24 hour nurse advice line as needed; reinforced the availability of the health care concierge for benefits questions. Client encouraged to call RNCM as needed.  Plan: RNCM will follow up per tier level within the next 6 months or sooner as indicated. RNCM will send updated care plan to client; send updated care plan to primary care provider. [facility] calendar/organizer provided. Send advanced directive packet.   Miranda Silversmith, RN, MSN, [place] [phone]

## 2019-03-28 NOTE — Telephone Encounter (Signed)
OK to stop Xarelto 5 days prior to procedure and resume as soon as possible after. She understands higher risk of stroke during this period of time. I have not seen a form cross my desk yet.

## 2019-03-28 NOTE — Progress Notes (Signed)
Virtual Visit via Telephone Note  I connected with Miranda [name] on 03/28/19 at  2:00 PM EST by telephone and verified that I am speaking with the correct person using two identifiers.   I discussed the limitations, risks, security and privacy concerns of performing an evaluation and management service by telephone and the availability of in person appointments. I also discussed with the patient that there may be a patient responsible charge related to this service. The patient expressed understanding and agreed to proceed.  Location patient: home Location provider: work office Participants present for the call: patient, provider Patient did not have a visit in the prior 7 days to address this/these issue(s).   History of Present Illness:  She has scheduled this visit to discuss 2 concerns.  1.  She is due for her 54-month hydrocodone refills per pain management contract.  She has had no change in dosing, no perceived side effects.  She has an appointment for an epidural back injection coming up soon.  2.  She is having dental surgery soon.  She tells me that her oral surgeon needs instruction on what to do with her Xarelto that she takes for atrial fibrillation.   Observations/Objective: Patient sounds cheerful and well on the phone. I do not appreciate any increased work of breathing. Speech and thought processing are grossly intact. Patient reported vitals: None reported   Current Outpatient Medications:  .  albuterol (PROVENTIL HFA;VENTOLIN HFA) 108 (90 Base) MCG/ACT inhaler, Inhale 1 puff into the lungs every 6 (six) hours as needed. , Disp: , Rfl:  .  ALPRAZolam (XANAX) 0.25 MG tablet, Take 1 tablet (0.25 mg total) by mouth 3 (three) times daily as needed for anxiety., Disp: 30 tablet, Rfl: 0 .  amLODipine (NORVASC) 10 MG tablet, Take 1 tablet by mouth once daily, Disp: 90 tablet, Rfl: 1 .  atorvastatin (LIPITOR) 80 MG tablet, Take 1 tablet by mouth once daily, Disp: 90  tablet, Rfl: 1 .  Continuous Blood Gluc Receiver (FREESTYLE LIBRE 14 DAY READER) DEVI, 1 each by Does not apply route daily., Disp: 1 each, Rfl: 2 .  Continuous Blood Gluc Sensor (FREESTYLE LIBRE 14 DAY SENSOR) MISC, USE  ONCE DAILY, Disp: 2 each, Rfl: 3 .  cyclobenzaprine (FLEXERIL) 5 MG tablet, TAKE 1 TABLET BY MOUTH TWICE DAILY AS NEEDED FOR MUSCLE SPASM, Disp: 45 tablet, Rfl: 0 .  diclofenac sodium (VOLTAREN) 1 % GEL, Apply 2 g topically 4 (four) times daily., Disp: 1 Tube, Rfl: 1 .  docusate sodium (COLACE) 100 MG capsule, Take 1 capsule (100 mg total) by mouth 2 (two) times daily., Disp: 10 capsule, Rfl: 0 .  ESBRIET 801 MG TABS, TAKE 1 TABLET BY MOUTH THREE TIMES A DAY WITH MEALS. PROTECT FROM SUNBURN. CALL [phone] TO REFILL, Disp: 90 tablet, Rfl: 11 .  furosemide (LASIX) 40 MG tablet, Take 0.5 tablets (20 mg total) by mouth daily., Disp: 90 tablet, Rfl: 1 .  hydrochlorothiazide (HYDRODIURIL) 25 MG tablet, Take 1 tablet (25 mg total) by mouth daily., Disp: 30 tablet, Rfl: 6 .  HYDROcodone-acetaminophen (NORCO) 10-325 MG tablet, Take 1 tablet by mouth every 8 (eight) hours as needed., Disp: 90 tablet, Rfl: 0 .  HYDROcodone-acetaminophen (NORCO) 10-325 MG tablet, Take 1 tablet by mouth every 6 (six) hours as needed for severe pain., Disp: 90 tablet, Rfl: 0 .  HYDROcodone-acetaminophen (NORCO) 10-325 MG tablet, Take 1 tablet by mouth every 6 (six) hours as needed., Disp: 90 tablet, Rfl: 0 .  lactulose (CONSTULOSE) 10 GM/15ML solution, TAKE 30 MILLILITERS THREE TIMES DAILY BY  MOUTH AS NEEDED FOR MILD CONSTIPATION, Disp: 473 mL, Rfl: 4 .  losartan (COZAAR) 100 MG tablet, Take 1 tablet by mouth once daily, Disp: 90 tablet, Rfl: 1 .  metFORMIN (GLUCOPHAGE-XR) 500 MG 24 hr tablet, TAKE 2 TABLETS BY MOUTH ONCE DAILY WITH BREAKFAST, Disp: 180 tablet, Rfl: 0 .  MULTAQ 400 MG tablet, TAKE 1 TABLET BY MOUTH TWICE DAILY WITH A MEAL, Disp: 180 tablet, Rfl: 0 .  polyethylene glycol powder  (GLYCOLAX/MIRALAX) powder, Take 17 g by mouth daily., Disp: 3350 g, Rfl: 1 .  rivaroxaban (XARELTO) 20 MG TABS tablet, Take 1 tablet (20 mg total) by mouth daily with supper., Disp: 90 tablet, Rfl: 1  Review of Systems:  Constitutional: Denies fever, chills, diaphoresis, appetite change and fatigue.  HEENT: Denies photophobia, eye pain, redness, hearing loss, ear pain, congestion, sore throat, rhinorrhea, sneezing, mouth sores, trouble swallowing, neck pain, neck stiffness and tinnitus.   Respiratory: Denies SOB, DOE, cough, chest tightness,  and wheezing.   Cardiovascular: Denies chest pain, palpitations and leg swelling.  Gastrointestinal: Denies nausea, vomiting, abdominal pain, diarrhea, constipation, blood in stool and abdominal distention.  Genitourinary: Denies dysuria, urgency, frequency, hematuria, flank pain and difficulty urinating.  Endocrine: Denies: hot or cold intolerance, sweats, changes in hair or nails, polyuria, polydipsia. Musculoskeletal: Denies myalgias, back pain, joint swelling, arthralgias and gait problem.  Skin: Denies pallor, rash and wound.  Neurological: Denies dizziness, seizures, syncope, weakness, light-headedness, numbness and headaches.  Hematological: Denies adenopathy. Easy bruising, personal or family bleeding history  Psychiatric/Behavioral: Denies suicidal ideation, mood changes, confusion, nervousness, sleep disturbance and agitation   Assessment and Plan:  Paroxysmal atrial fibrillation Chronic anticoagulation -Should  be relatively safe for her to go off Xarelto 5 days prior to procedure and to resume it immediately after surgery. -I have not received any forms from her oral surgeon's office to this effect yet, but would be happy to fill them out if I do it.  Primary osteoarthritis of left shoulder  Chronic low back pain without sciatica, unspecified back pain laterality   NCCSRS reviewed in EPIC   Indication for chronic opioid:  Chronic back  and shoulder pain Medication and dose:  Hydrocodone 10/APAP 325 mg 1 tablet every 6 hours as needed for pain # pills per month:  90 Last UDS date:  None on file Opioid Treatment Agreement signed:  Yes Opioid Treatment Agreement last reviewed with patient:  02/02/2018 NCCSRS reviewed this encounter (include red flags):   Yes, no red flags, overdose risk score of 90     I discussed the assessment and treatment plan with the patient. The patient was provided an opportunity to ask questions and all were answered. The patient agreed with the plan and demonstrated an understanding of the instructions.   The patient was advised to call back or seek an in-person evaluation if the symptoms worsen or if the condition fails to improve as anticipated.  I provided 24 minutes of non-face-to-face time during this encounter.   [name], MD [facility]

## 2019-03-28 NOTE — Telephone Encounter (Signed)
Miranda [name] call from Dr. Mingo Amber office regarding pts . Pt is getting oral surgery . Please advise   Call back [phone] Fax number [phone]

## 2019-03-29 ENCOUNTER — Telehealth: Payer: Self-pay | Admitting: Internal Medicine

## 2019-03-29 ENCOUNTER — Telehealth: Payer: Self-pay | Admitting: *Deleted

## 2019-03-29 DIAGNOSIS — M5030 Other cervical disc degeneration, unspecified cervical region: Secondary | ICD-10-CM | POA: Diagnosis not present

## 2019-03-29 DIAGNOSIS — M5136 Other intervertebral disc degeneration, lumbar region: Secondary | ICD-10-CM | POA: Diagnosis not present

## 2019-03-29 DIAGNOSIS — M5412 Radiculopathy, cervical region: Secondary | ICD-10-CM | POA: Diagnosis not present

## 2019-03-29 NOTE — Telephone Encounter (Signed)
Copied from [place] [phone]. Topic: General - Other >> Mar 29, 2019 10:18 AM Keene Breath wrote: Reason for CRM: Patient called to ask the doctor to send information for her to no longer take the [facility] because she needs to have oral surgery and cannot be on this medication during that time.  Please advise and call patient to discuss at [phone] >> Mar 29, 2019  4:54 PM Mathis Bud wrote: Patient called back with fax number for PCP to send over,   Patient will also like a call back when this is completed.   FAX [phone] Patient call back [phone]

## 2019-03-29 NOTE — Telephone Encounter (Signed)
Copied from [place] [id number]. Topic: General - Other >> Mar 29, 2019 10:18 AM Keene Breath wrote: Reason for CRM: Patient called to ask the doctor to send information for her to no longer take the [facility] because she needs to have oral surgery and cannot be on this medication during that time.  Please advise and call patient to discuss at [phone]

## 2019-03-29 NOTE — Telephone Encounter (Signed)
See phone note 03/29/19

## 2019-03-29 NOTE — Telephone Encounter (Signed)
Message Routed to PCP CMA 

## 2019-03-29 NOTE — Telephone Encounter (Signed)
Pt stated that Dr. Jerilee Hoh or her assistant has to speak with Dr. Mingo Amber office regarding Xarelto. They cannot just take directions from pt. Please contact Dr. Mingo Amber office   [phone]

## 2019-03-29 NOTE — Telephone Encounter (Signed)
Dr. Mingo Amber office is calling back in to follow up on if clearance form was received? They are needing clarity on Xarelto medication  Please fax form back to: Fax: [phone]

## 2019-03-29 NOTE — Telephone Encounter (Signed)
Left message on machine for patient to return our call   CRM  -Should  be relatively safe for her to go off Xarelto 5 days prior to procedure and to resume it immediately after surgery. -I have not received any forms from her oral surgeon's office to this effect yet, but would be happy to fill them out if I do it.

## 2019-03-30 NOTE — Telephone Encounter (Signed)
Yes: ok to stay off xarelto for 5 days prior to surgery and to resume ASAP afterwards.

## 2019-03-30 NOTE — Telephone Encounter (Signed)
Okay to send letter.

## 2019-03-30 NOTE — Telephone Encounter (Signed)
Patient is calling to request if letter could be fax to her Dentist office. Stating that Patient should be off of rivaroxaban (XARELTO) 20 MG TABS tablet [id number]  For 5 days. Patient states that Dentist office is not going to take her word for surgery. Name of Dentist office Dr. Nicki Reaper Rhem Office name- [facility] [place] Phone [phone] Fax- [phone]  Please advise with Patient when Completed CB- [phone]

## 2019-03-31 ENCOUNTER — Telehealth: Payer: Self-pay | Admitting: Internal Medicine

## 2019-03-31 ENCOUNTER — Encounter: Payer: Self-pay | Admitting: Internal Medicine

## 2019-03-31 DIAGNOSIS — H40023 Open angle with borderline findings, high risk, bilateral: Secondary | ICD-10-CM | POA: Diagnosis not present

## 2019-03-31 NOTE — Telephone Encounter (Signed)
Attempted to call but was placed on hold. Will try again at another time.

## 2019-03-31 NOTE — Telephone Encounter (Signed)
Copied from [place] [phone]. Topic: General - Other >> Mar 31, 2019  9:46 AM Keene Breath wrote: Reason for CRM: Called to inform the doctor of a drug interaction with the patient's medication.  Please advise and call to discuss at [phone]

## 2019-03-31 NOTE — Telephone Encounter (Signed)
Message Routed to PCP CMA 

## 2019-03-31 NOTE — Telephone Encounter (Signed)
Patient is checking status. Call back [phone]

## 2019-03-31 NOTE — Telephone Encounter (Signed)
Letter faxed and confirmed.  Patient notified by MyChart.

## 2019-04-04 ENCOUNTER — Telehealth: Payer: Self-pay | Admitting: Internal Medicine

## 2019-04-04 NOTE — Telephone Encounter (Signed)
Do we know what this is about?

## 2019-04-04 NOTE — Telephone Encounter (Signed)
Pt stated that the pharmacy told her there is a new law and that the PCP has to call the pharmacy to release the Pts refill for HYDROcodone-acetaminophen (NORCO) 10-325 MG tablet  / please advise and call Pt when the Rx is released so she can pick it up

## 2019-04-04 NOTE — Telephone Encounter (Signed)
FYI  Spoke with pharmacist and he request that in the future there is a conversation with the patient about Narcan.

## 2019-04-04 NOTE — Telephone Encounter (Signed)
She was refilled x 3 on 1/12 (3 separate prescriptions). We should not have to do anything on our end.

## 2019-04-05 NOTE — Telephone Encounter (Signed)
Spoke with pharmacist and Rx already filled and picked up.

## 2019-04-06 ENCOUNTER — Other Ambulatory Visit: Payer: Self-pay | Admitting: Pulmonary Disease

## 2019-04-06 MED ORDER — ESBRIET 801 MG PO TABS: 1.0000 | ORAL_TABLET | Freq: Three times a day (TID) | ORAL | 11 refills | Status: DC

## 2019-04-06 NOTE — Addendum Note (Signed)
Addended by: Lia Foyer R on: 04/06/2019 12:09 PM   Modules accepted: Orders

## 2019-04-06 NOTE — Addendum Note (Signed)
Addended by: Lia Foyer R on: 04/06/2019 12:12 PM   Modules accepted: Orders

## 2019-04-06 NOTE — Addendum Note (Signed)
Addended by: Lia Foyer R on: 04/06/2019 12:14 PM   Modules accepted: Orders

## 2019-04-14 ENCOUNTER — Telehealth: Payer: Self-pay | Admitting: Internal Medicine

## 2019-04-14 ENCOUNTER — Other Ambulatory Visit: Payer: Self-pay | Admitting: Pharmacist

## 2019-04-14 NOTE — Telephone Encounter (Signed)
The patient called wanting to know if it is okay for her to get the [place] vaccination.  Please advise

## 2019-04-14 NOTE — Telephone Encounter (Signed)
Patient advised to get a Covid vaccine

## 2019-04-14 NOTE — Patient Outreach (Signed)
[facility]) Care Management  04/14/2019  Miranda [name] [birth year] [id number]   Patient was called regarding medication review follow up. She answered the phone but asked that I call her back in a few minutes. Patient was called back. Unfortunately, she did not answer her phone. HIPAA compliant message was left on her voicemail.  Plan: Call patient back in 4-6 weeks. Send unsuccessful outreach letter.  Elayne Guerin, PharmD, [place] Clinical Pharmacist [phone]

## 2019-04-21 ENCOUNTER — Telehealth: Payer: Self-pay | Admitting: Internal Medicine

## 2019-04-21 ENCOUNTER — Other Ambulatory Visit: Payer: Self-pay | Admitting: Pulmonary Disease

## 2019-04-24 NOTE — Progress Notes (Signed)
Unsuccessful outreach today in response to referral by [name], [name], MD for CCM/Care coordination services.  Will attempt outreach again in 7 days.  Raynicia Dukes [name] Scheduler

## 2019-04-25 ENCOUNTER — Telehealth: Payer: Self-pay | Admitting: Primary Care

## 2019-04-25 DIAGNOSIS — J9610 Chronic respiratory failure, unspecified whether with hypoxia or hypercapnia: Secondary | ICD-10-CM | POA: Diagnosis not present

## 2019-04-25 DIAGNOSIS — G4733 Obstructive sleep apnea (adult) (pediatric): Secondary | ICD-10-CM | POA: Diagnosis not present

## 2019-04-25 NOTE — Telephone Encounter (Addendum)
I called the Specialty pharmacy and verbally called in the [place]. Nothing further is needed.    Pirfenidone (ESBRIET) 801 MG TABS [id number]   Order Details Dose: 1 tablet Route: Oral Frequency: 3 times daily  Dispense Quantity: 90 tablet Refills: 11   Indications of Use: Idiopathic Pulmonary Fibrosis      Sig: Take 1 tablet by mouth 3 (three) times daily.      Start Date: 04/06/19 End Date: --  Written Date: 04/06/19 Expiration Date: 04/05/20  Original Order:  Pirfenidone ([place]) 801 MG TABS [id number]  Providers  Ordering and Authorizing Provider: [name], MD NPI: [id number]  DEA #: [id number]  Ordering User:  Clovis Fredrickson, [place], [place]  [address] [place] [address]  Phone:  [phone] Fax:  [phone]  DEA #:  --

## 2019-05-06 ENCOUNTER — Other Ambulatory Visit: Payer: Self-pay | Admitting: Internal Medicine

## 2019-05-06 DIAGNOSIS — I480 Paroxysmal atrial fibrillation: Secondary | ICD-10-CM

## 2019-05-12 ENCOUNTER — Telehealth (INDEPENDENT_AMBULATORY_CARE_PROVIDER_SITE_OTHER): Payer: HMO | Admitting: Physician Assistant

## 2019-05-12 ENCOUNTER — Encounter: Payer: Self-pay | Admitting: Physician Assistant

## 2019-05-12 VITALS — BP 141/71 | HR 48 | Wt 190.2 lb

## 2019-05-12 DIAGNOSIS — E119 Type 2 diabetes mellitus without complications: Secondary | ICD-10-CM | POA: Diagnosis not present

## 2019-05-12 DIAGNOSIS — I110 Hypertensive heart disease with heart failure: Secondary | ICD-10-CM

## 2019-05-12 DIAGNOSIS — I480 Paroxysmal atrial fibrillation: Secondary | ICD-10-CM | POA: Diagnosis not present

## 2019-05-12 DIAGNOSIS — E785 Hyperlipidemia, unspecified: Secondary | ICD-10-CM

## 2019-05-12 DIAGNOSIS — I10 Essential (primary) hypertension: Secondary | ICD-10-CM

## 2019-05-12 NOTE — Progress Notes (Signed)
Virtual Visit via Telephone Note   This visit type was conducted due to national recommendations for restrictions regarding the COVID-19 Pandemic (e.g. social distancing) in an effort to limit this patient's exposure and mitigate transmission in our community.  Due to her co-morbid illnesses, this patient is at least at moderate risk for complications without adequate follow up.  This format is felt to be most appropriate for this patient at this time.  The patient did not have access to video technology/had technical difficulties with video requiring transitioning to audio format only (telephone).  All issues noted in this document were discussed and addressed.  No physical exam could be performed with this format.  Please refer to the patient's chart for her  consent to telehealth for [facility]   Date:  05/12/2019   ID:  Miranda [name], DOB Jul 31, 1945, MRN [id number]  Patient Location: Home Provider Location: Office  PCP:  [name], [name], MD  Cardiologist:  [name], MD  Electrophysiologist:  [name], MD   Evaluation Performed:  Follow-Up Visit  Chief Complaint:  followup  History of Present Illness:    Miranda [name] is a 74 y.o. female with past medical history of HTN, HLD, OSA on CPAP, DM 2, and atrial fibrillation.  Myoview in March 2016 showed EF 69%, normal perfusion no wall motion.  Patient was diagnosed with acute PE in February 2018 and was treated with 33-month course of Xarelto.  High-resolution CT obtained in February 2018 suggested interstitial lung disease as well as two-vessel CAD.  Echocardiogram obtained in February 2018 showed EF 60 to 65%, grade 1 DD, moderate LAE.  Repeat CT in July 2019 showed resolution of PE.  Patient was diagnosed with atrial fibrillation with RVR while undergoing colonoscopy in July 2019.  TSH was normal.  She was restarted on Xarelto and placed on metoprolol for rate control.  Echocardiogram showed normal EF,  mild atrial enlargement.  Myoview was normal.  She was seen by Dr. Curt Bears and was started on Multaq.  Patient was last seen by Dr. [name] in October 2019 at which time she was feeling tired and has some dyspnea on exertion, however A. fib was controlled on Multaq.  Heart rate was bradycardic at 44, metoprolol reduced to 12.5 mg twice daily.  Metoprolol was eventually discontinued.  Her last office visit with Dr. Curt Bears was on 10/10/2018, she still occasionally feel weak and fatigued when her heart rate slows down, however atrial fibrillation seems to be quite well controlled at the time.  Blood pressure was elevated and she was started on HCTZ 25 mg daily.  Patient presents today for cardiology office visit.  Blood pressure occasionally is elevated in the 140s however also tend to run in the 130s.  I recommended continue on the current blood pressure medication and if systolic blood pressure keeps going up to the 150s, we will add another blood pressure medication.  Otherwise she denies any significant chest discomfort or palpitation.  She has no significant dizziness, blurred vision or feeling of passing out.  She can follow-up with Dr. Curt Bears in March and follow-up with Dr. [name] in 1 year.  The patient does not have symptoms concerning for COVID-19 infection (fever, chills, cough, or new shortness of breath).    Past Medical History:  Diagnosis Date  . Anxiety   . Arthritis   . Asthma   . Atrial fibrillation ([place])   . CHF (congestive heart failure) ([place])    pt.  unsure- but thinks she was hosp. for CHF- 2002  . Chronic kidney disease    [facility]  . Clotting disorder ([place])    blood clots in lungs/PE pulmonary embolism  . Colon polyps   . Complication of anesthesia    states requires a lot med. to put her to sleep   . DDD (degenerative disc disease) 09/17/2011  . Depression    "sometimes "  . DM (diabetes mellitus) ([place])   . Family history of anesthesia complication   .  GERD (gastroesophageal reflux disease)   . Glaucoma    bilateral, pt. admits that she is noncompliant to eye gtts.   . Hemorrhoids   . Hyperlipidemia   . Hypertension    had stress, echo- 2006 /w [place], Cardiac Cath, per pt. 2002, echo repeated 2012- wnl   . Low back pain   . Shortness of breath   . Sleep apnea    uses c-pap- q night recently   Past Surgical History:  Procedure Laterality Date  . ABDOMINAL HYSTERECTOMY     ectopic, fibroids  . arm surgery Right   . CARDIAC CATHETERIZATION    . CATARACT EXTRACTION, BILATERAL     cataracts removed bilateral- ?IOL  . COLONOSCOPY     remote  . FISSURECTOMY  10/08/2011   Procedure: FISSURECTOMY;  Surgeon: Stark Klein, MD;  Location: [facility];  Service: General;  Laterality: N/A;  . FLEXIBLE SIGMOIDOSCOPY  02/25/2011   Procedure: FLEXIBLE SIGMOIDOSCOPY;  Surgeon: Inda Castle, MD;  Location: [facility];  Service: Endoscopy;  Laterality: N/A;  . FOOT SURGERY     bilat, heel spurs- screw in R foot   . HEMORRHOID SURGERY  10/08/2011   Procedure: HEMORRHOIDECTOMY;  Surgeon: Stark Klein, MD;  Location: [place];  Service: General;  Laterality: N/A;  External   . SPHINCTEROTOMY  10/08/2011   Procedure: Joan Mayans;  Surgeon: Stark Klein, MD;  Location: [place];  Service: General;  Laterality: N/A;     Current Meds  Medication Sig  . albuterol (PROVENTIL HFA;VENTOLIN HFA) 108 (90 Base) MCG/ACT inhaler Inhale 1 puff into the lungs every 6 (six) hours as needed.   . ALPRAZolam (XANAX) 0.25 MG tablet Take 1 tablet (0.25 mg total) by mouth 3 (three) times daily as needed for anxiety.  Marland Kitchen amLODipine (NORVASC) 10 MG tablet Take 1 tablet by mouth once daily  . atorvastatin (LIPITOR) 80 MG tablet Take 1 tablet by mouth once daily  . Continuous Blood Gluc Receiver (FREESTYLE LIBRE 14 DAY READER) DEVI 1 each by Does not apply route daily.  . Continuous Blood Gluc Sensor (FREESTYLE LIBRE 14 DAY SENSOR) MISC USE  ONCE DAILY  . cyclobenzaprine  (FLEXERIL) 5 MG tablet TAKE 1 TABLET BY MOUTH TWICE DAILY AS NEEDED FOR MUSCLE SPASM  . diclofenac sodium (VOLTAREN) 1 % GEL Apply 2 g topically 4 (four) times daily.  Marland Kitchen docusate sodium (COLACE) 100 MG capsule Take 1 capsule (100 mg total) by mouth 2 (two) times daily.  . furosemide (LASIX) 40 MG tablet Take 0.5 tablets (20 mg total) by mouth daily.  . hydrochlorothiazide (HYDRODIURIL) 25 MG tablet Take 1 tablet (25 mg total) by mouth daily.  Marland Kitchen HYDROcodone-acetaminophen (NORCO) 10-325 MG tablet Take 1 tablet by mouth every 6 (six) hours as needed.  . lactulose (CONSTULOSE) 10 GM/15ML solution TAKE 30 MILLILITERS THREE TIMES DAILY BY  MOUTH AS NEEDED FOR MILD CONSTIPATION  . losartan (COZAAR) 100 MG tablet Take 1 tablet by mouth once daily  .  metFORMIN (GLUCOPHAGE XR) 500 MG 24 hr tablet Take 500 mg by mouth daily with breakfast.  . MULTAQ 400 MG tablet TAKE 1 TABLET BY MOUTH TWICE DAILY WITH A MEAL  . Pirfenidone (ESBRIET) 801 MG TABS Take 1 tablet by mouth 3 (three) times daily.  . polyethylene glycol powder (GLYCOLAX/MIRALAX) powder Take 17 g by mouth daily.  Alveda Reasons 20 MG TABS tablet TAKE 1 TABLET BY MOUTH ONCE DAILY WITH SUPPER     Allergies:   Fish oil, Other, Penicillins, Pneumococcal vaccines, Cashew nut oil, Nitrofurantoin macrocrystal, Peanut oil, Aspirin, Bactrim [sulfamethoxazole-trimethoprim], Ciprofloxacin, Ibuprofen, Influenza vaccines, Ivp dye [iodinated diagnostic agents], Latex, Macrobid [nitrofurantoin monohydrate macrocrystals], and Shellfish allergy   Social History   Tobacco Use  . Smoking status: Never Smoker  . Smokeless tobacco: Never Used  Substance Use Topics  . Alcohol use: No    Alcohol/week: 0.0 standard drinks  . Drug use: No     Family Hx: The patient's family history includes Arthritis/Rheumatoid in her sister; Asthma in her sister; Breast cancer in her mother and sister; COPD in her sister; Colon cancer in her brother and brother; Emphysema in her  sister; Heart attack in her mother; Heart disease in her mother; Lung cancer in her sister. There is no history of Anesthesia problems.  ROS:   Please see the history of present illness.     All other systems reviewed and are negative.   Prior CV studies:   The following studies were reviewed today:  Echo 10/19/2017 LV EF: 60% -  65%   Study Conclusions   - Left ventricle: The cavity size was normal. Systolic function was  normal. The estimated ejection fraction was in the range of 60%  to 65%. Wall motion was normal; there were no regional wall  motion abnormalities. Doppler parameters are consistent with  abnormal left ventricular relaxation (grade 1 diastolic  dysfunction).  - Aortic valve: There was no significant regurgitation.  - Mitral valve: There was trivial regurgitation.  - Left atrium: The atrium was mildly dilated.  - Right ventricle: The cavity size was mildly dilated. Wall  thickness was normal.  - Tricuspid valve: There was trivial regurgitation.  - Pulmonic valve: There was trivial regurgitation.  - Inferior vena cava: The vessel was normal in size. The  respirophasic diameter changes were in the normal range (>= 50%),  consistent with normal central venous pressure.   Impressions:   - Normal LV systolic function. No significant valvular disease.     Myoview 11/12/2017  Nuclear stress EF: 66%.  There was no ST segment deviation noted during stress.  No T wave inversion was noted during stress.  The study is normal.  This is a low risk study.  The left ventricular ejection fraction is hyperdynamic (>65%).  Labs/Other Tests and Data Reviewed:    EKG:  An ECG dated 10/10/2018 was personally reviewed today and demonstrated:  Normal sinus rhythm without significant ST-T wave changes  Recent Labs: 11/09/2018: ALT 25; BUN 20; Creatinine, Ser 0.98; Potassium 3.4; Sodium 138   Recent Lipid Panel Lab Results  Component Value Date/Time    CHOL 167 11/26/2017 09:08 AM   TRIG 75 11/26/2017 09:08 AM   HDL 61 11/26/2017 09:08 AM   CHOLHDL 2.7 11/26/2017 09:08 AM   CHOLHDL 4 06/01/2014 11:45 AM   LDLCALC 91 11/26/2017 09:08 AM   LDLDIRECT 152.8 03/30/2011 08:59 AM    Wt Readings from Last 3 Encounters:  05/12/19 190 lb 3.2 oz (86.3 kg)  12/21/18 200 lb 6.4 oz (90.9 kg)  11/09/18 196 lb 3.2 oz (89 kg)     Objective:    Vital Signs:  BP (!) 141/71   Pulse (!) 48   Wt 190 lb 3.2 oz (86.3 kg)   BMI 31.65 kg/m    VITAL SIGNS:  reviewed  ASSESSMENT & PLAN:    1. PAF: Continue on Xarelto and Multaq.  Overall patient is doing very well and denies any significant palpitation.  2. Hypertension: Blood pressure fairly controlled on the current therapy  3. Hyperlipidemia: On Lipitor  4. DM2: Managed by primary care provider  COVID-19 Education: The signs and symptoms of COVID-19 were discussed with the patient and how to seek care for testing (follow up with PCP or arrange E-visit).  The importance of social distancing was discussed today.  Time:   Today, I have spent 13 minutes with the patient with telehealth technology discussing the above problems.     Medication Adjustments/Labs and Tests Ordered: Current medicines are reviewed at length with the patient today.  Concerns regarding medicines are outlined above.   Tests Ordered: No orders of the defined types were placed in this encounter.   Medication Changes: No orders of the defined types were placed in this encounter.   Follow Up:  Either In Person or Virtual in 1 year(s)  Signed, [name], [place]  05/12/2019 10:41 AM    [place]

## 2019-05-12 NOTE — Patient Instructions (Signed)
Medication Instructions:  Your physician recommends that you continue on your current medications as directed. Please refer to the Current Medication list given to you today.  *If you need a refill on your cardiac medications before your next appointment, please call your pharmacy*  Follow-Up: At [facility], you and your health needs are our priority.  As part of our continuing mission to provide you with exceptional heart care, we have created designated Provider Care Teams.  These Care Teams include your primary Cardiologist (physician) and Advanced Practice Providers (APPs -  Physician Assistants and Nurse Practitioners) who all work together to provide you with the care you need, when you need it.  We recommend signing up for the patient portal called "MyChart".  Sign up information is provided on this After Visit Summary.  MyChart is used to connect with patients for Virtual Visits (Telemedicine).  Patients are able to view lab/test results, encounter notes, upcoming appointments, etc.  Non-urgent messages can be sent to your provider as well.   To learn more about what you can do with MyChart, go to [email or web address].    Your next appointment:   12 month(s)  The format for your next appointment:   In Person  Provider:   [name], MD   Other Instructions

## 2019-05-15 ENCOUNTER — Other Ambulatory Visit: Payer: Self-pay | Admitting: Internal Medicine

## 2019-05-15 DIAGNOSIS — F419 Anxiety disorder, unspecified: Secondary | ICD-10-CM

## 2019-05-15 NOTE — Telephone Encounter (Signed)
Pt is calling in needing to have the following refilled alprazolam 0.25MG   and hydrochlorothiazide 25 MG  Pharm:  [facility] on [address].

## 2019-05-16 MED ORDER — HYDROCHLOROTHIAZIDE 25 MG PO TABS: 25.0000 mg | ORAL_TABLET | Freq: Every day | ORAL | 1 refills | Status: DC

## 2019-05-16 MED ORDER — ALPRAZOLAM 0.25 MG PO TABS: 0.2500 mg | ORAL_TABLET | Freq: Three times a day (TID) | ORAL | 0 refills | Status: DC | PRN

## 2019-05-17 ENCOUNTER — Encounter: Payer: HMO | Admitting: Internal Medicine

## 2019-05-17 DIAGNOSIS — J84112 Idiopathic pulmonary fibrosis: Secondary | ICD-10-CM

## 2019-05-17 NOTE — Research (Signed)
IPF-PRO Registry  Purpose: To collect data and biological samples that will support future research studies.  The goal of the registry is to research the current approaches to diagnosis, treatment, and progression of IPF. In addition, the registry will analyze participant characteristics, assess quality of life, describe participants interactions with the health care system, determine IPF treatment practices across multiple institutions, and utilize biological samples to identify disease biomarkers.   Clinical Research Coordinator / Research RN note : This visit for Subject Miranda [name] with DOB: 21-Sep-1945 on 05/17/2019 for the above protocol is Visit/Encounter #30 Month Follow-up and is for purpose of research. The consent for this encounter is under Protocol Version Amendment 4 and is currently IRB approved. Subject expressed continued interest and consent in continuing as a study subject. Subject confirmed that there was no change in contact information (e.g. address, telephone, email). Subject thanked for participation in research and contribution to science.  All procedures completed per the above mentioned study protocol refer to the subjects paper source binder for further details.  Signed by  Verline Lema Research Assistant [employer]  [place], [place] 10:48 AM 05/17/2019

## 2019-05-18 ENCOUNTER — Telehealth: Payer: Self-pay | Admitting: Internal Medicine

## 2019-05-18 NOTE — Telephone Encounter (Signed)
Will do PA on 3/5 due to time of day. Will keep in triage to follow up on.

## 2019-05-19 NOTE — Telephone Encounter (Signed)
Called [facility] to initiate Esbriet 801mg  Prior Josem Kaufmann, rep Jenny Reichmann advised that patient already has an active authorization on file that started on 05/02/18 and is valid to 03/15/20.    PA# [id number] Phone# [phone]  Called [facility], provided info to rep [facility], she will notate patient's file. She advised that they have not experienced any billing issues. Request was for documentation purposes.  1:11 PM Beatriz Chancellor, CPhT

## 2019-05-19 NOTE — Telephone Encounter (Signed)
Rachael, can you help [place] out with this please.

## 2019-05-25 ENCOUNTER — Other Ambulatory Visit: Payer: Self-pay | Admitting: Pharmacist

## 2019-05-25 NOTE — Patient Outreach (Signed)
[facility]) Care Management  [place]   05/25/2019  Miranda [name] 1945/12/02 [id number]  Reason for referral: Medication Adherence  Referral source: [employer] Care Manager with [facility] Current insurance: [employer]  HPI:  74 year old female with multiple medical conditions including but not limited to: Asthma, A Fib, DJD, cataracts, type 2 diabetes, dyslipidemia, hypertension, Interstitial lung disease (pulmonary fibrosis), OSA, and osteoarthritis.  Outreach:  Successful telephone call with patient.  HIPAA identifiers verified.   Objective: The 10-year ASCVD risk score Mikey Bussing DC Jr., et al., 2013) is: 29.5%   Values used to calculate the score:     Age: 65 years     Sex: Female     Is Non-Hispanic African American: Yes     Diabetic: Yes     Tobacco smoker: No     Systolic Blood Pressure: [id number] mmHg     Is BP treated: Yes     HDL Cholesterol: 61 mg/dL     Total Cholesterol: 167 mg/dL  Lab Results  Component Value Date   CREATININE 0.98 11/09/2018   CREATININE 0.74 07/14/2018   CREATININE 0.82 04/20/2018    Lab Results  Component Value Date   HGBA1C 5.9 (A) 10/19/2018    Lipid Panel     Component Value Date/Time   CHOL 167 11/26/2017 0908   TRIG 75 11/26/2017 0908   HDL 61 11/26/2017 0908   CHOLHDL 2.7 11/26/2017 0908   CHOLHDL 4 06/01/2014 1145   VLDL 16.8 06/01/2014 1145   LDLCALC 91 11/26/2017 0908   LDLDIRECT 152.8 03/30/2011 0859    BP Readings from Last 3 Encounters:  05/12/19 (!) 141/71  12/21/18 132/80  11/09/18 120/70    Allergies  Allergen Reactions  . Fish Oil Anaphylaxis  . Other Hives, Shortness Of Breath and Swelling    Allergic to cashew nuts and peanut oil.  Marland Kitchen Penicillins Anaphylaxis, Hives and Swelling    Has patient had a PCN reaction causing immediate rash, facial/tongue/throat swelling, SOB or lightheadedness with hypotension: Face swelling and hives started first, then  swelling of the throat  Has patient had a PCN reaction causing severe rash involving mucus membranes or skin necrosis: Yes  Has patient had a PCN reaction that required hospitalization: No  Has patient had a PCN reaction occurring within the last 10 years: Yes  If all of the above answers are "NO", then may proceed with Cephalospor  . Pneumococcal Vaccines Nausea And Vomiting  . Cashew Nut Oil   . Nitrofurantoin Macrocrystal Hives  . Peanut Oil   . Aspirin Itching and Rash  . Bactrim [Sulfamethoxazole-Trimethoprim] Hives, Itching and Rash  . Ciprofloxacin Hives, Itching and Rash  . Ibuprofen Rash  . Influenza Vaccines Hives  . Ivp Dye [Iodinated Diagnostic Agents] Hives, Itching and Rash    Gives benadryl to counteract symptoms  . Latex Rash  . Macrobid [Nitrofurantoin Monohydrate Macrocrystals] Hives  . Shellfish Allergy Hives    Patient also allergic to seafood    Medications Reviewed Today    Reviewed by Elayne Guerin, [facility] (Pharmacist) on 05/25/19 at 1058  Med List Status: <None>  Medication Order Taking? Sig Documenting Provider Last Dose Status Informant  albuterol (PROVENTIL HFA;VENTOLIN HFA) 108 (90 Base) MCG/ACT inhaler [id number] Yes Inhale 1 puff into the lungs every 6 (six) hours as needed.  [provider] Taking Active   ALPRAZolam Duanne Moron) 0.25 MG tablet [id number] Yes Take 1 tablet (0.25 mg total) by mouth  3 (three) times daily as needed for anxiety. Dutch Quint B, FNP Taking Active   amLODipine (NORVASC) 10 MG tablet [id number] Yes Take 1 tablet by mouth once daily [name], [name], MD Taking Active   atorvastatin (LIPITOR) 80 MG tablet [id number] Yes Take 1 tablet by mouth once daily [name], [name], MD Taking Active   Continuous Blood Gluc Receiver (FREESTYLE LIBRE 14 DAY READER) [place] [id number] Yes 1 each by Does not apply route daily. [name], [name], MD Taking Active   Continuous Blood Gluc Sensor (FREESTYLE LIBRE 14 [place])  [place] [id number] Yes USE  ONCE DAILY [name], [name], MD Taking Active   cyclobenzaprine (FLEXERIL) 5 MG tablet [id number] Yes TAKE 1 TABLET BY MOUTH TWICE DAILY AS NEEDED FOR MUSCLE SPASM [name], [name], MD Taking Active   diclofenac sodium (VOLTAREN) 1 % GEL [id number] Yes Apply 2 g topically 4 (four) times daily. Caren Griffins, MD Taking Active   docusate sodium (COLACE) 100 MG capsule [id number] Yes Take 1 capsule (100 mg total) by mouth 2 (two) times daily. [name], [name], MD Taking Active            Med Note Juleen China, Deno Etienne   Tue Mar 28, 2019 12:42 PM) Dewaine Conger as needed.  furosemide (LASIX) 40 MG tablet [id number] Yes Take 0.5 tablets (20 mg total) by mouth daily. [name], [name], MD Taking Active   hydrochlorothiazide (HYDRODIURIL) 25 MG tablet [id number] Yes Take 1 tablet (25 mg total) by mouth daily. Dutch Quint B, FNP Taking Active   HYDROcodone-acetaminophen [facility]) 10-325 MG tablet [id number] Yes Take 1 tablet by mouth every 6 (six) hours as needed. [name], [name], MD Taking Active   lactulose (CONSTULOSE) 10 GM/15ML solution [id number] Yes TAKE 30 MILLILITERS THREE TIMES DAILY BY  MOUTH AS NEEDED FOR MILD CONSTIPATION [name], [name], MD Taking Active   losartan (COZAAR) 100 MG tablet [id number] Yes Take 1 tablet by mouth once daily [name], [name], MD Taking Active   metFORMIN (GLUCOPHAGE XR) 500 MG 24 hr tablet [id number] Yes Take 500 mg by mouth daily with breakfast. [provider] Taking Active   MULTAQ 400 MG tablet [id number] Yes TAKE 1 TABLET BY MOUTH TWICE DAILY WITH A MEAL [name], [name], MD Taking Active   Pirfenidone (ESBRIET) 801 MG TABS [id number] Yes Take 1 tablet by mouth 3 (three) times daily. [name], MD Taking Active   polyethylene glycol powder (GLYCOLAX/MIRALAX) powder [id number] Yes Take 17 g by mouth daily. [name], [name], MD Taking Active            Med  Note Juleen China, Deno Etienne   Tue Mar 28, 2019 12:43 PM) Dewaine Conger as needed.  XARELTO 20 MG TABS tablet [id number] Yes TAKE 1 TABLET BY MOUTH ONCE DAILY WITH SUPPER [name], [name], MD Taking Active           Assessment: HgA1c-5.9 8/20 On statin-Atorvastatin last filled 02/20/2019 #90   Plan: . Will close [facility] pharmacy case as no further medication needs identified at this time.  Am happy to assist in the future as needed.    Elayne Guerin, PharmD, [place] Clinical Pharmacist [phone]

## 2019-05-29 ENCOUNTER — Ambulatory Visit: Payer: HMO | Admitting: Cardiology

## 2019-05-29 ENCOUNTER — Other Ambulatory Visit: Payer: Self-pay

## 2019-05-29 ENCOUNTER — Encounter: Payer: Self-pay | Admitting: Cardiology

## 2019-05-29 VITALS — BP 134/60 | HR 59 | Ht 65.0 in | Wt 193.0 lb

## 2019-05-29 DIAGNOSIS — I480 Paroxysmal atrial fibrillation: Secondary | ICD-10-CM

## 2019-05-29 NOTE — Progress Notes (Signed)
Electrophysiology Office Note   Date:  05/29/2019   ID:  Miranda, [name] 06-09-1945, MRN [id number]  PCP:  [name], [name], MD  Cardiologist:  [name] Primary Electrophysiologist:  [name], MD    No chief complaint on file.    History of Present Illness: Miranda [name] is a 74 y.o. female who is being seen today for the evaluation of atrial fibrillation at the request of [name]. Presenting today for electrophysiology evaluation.  She has a history of depression, GERD, hypertension, hyperlipidemia, OSA on CPAP, type 2 diabetes, and recently diagnosed atrial fibrillation.  She was diagnosed with a PE February 2018 and was started on Xarelto for 6 months.  High-resolution CT showed interstitial lung disease as well as aortic atherosclerosis and a two-vessel coronary artery disease.  She had a recent echo that showed a normal ejection fraction.  She was seen in the emergency room 09/14/2017 after being found in atrial fibrillation with rapid rates.  Today, denies symptoms of palpitations, chest pain, shortness of breath, orthopnea, PND, lower extremity edema, claudication, dizziness, presyncope, syncope, bleeding, or neurologic sequela. The patient is tolerating medications without difficulties.  Overall she is doing well.  She continues to have short episodes of atrial fibrillation.  Her episodes last up to 30 minutes and occur once every 2 to 3 weeks.  They mainly occur when she is exercising.  She can sit down and her symptoms improved.  She does feel palpitations and rapid heart rates when this happens.   Past Medical History:  Diagnosis Date  . Anxiety   . Arthritis   . Asthma   . Atrial fibrillation ([place])   . CHF (congestive heart failure) ([place])    pt. unsure- but thinks she was hosp. for CHF- 2002  . Chronic kidney disease    [facility]  . Clotting disorder ([place])    blood clots in lungs/PE pulmonary embolism  . Colon polyps   .  Complication of anesthesia    states requires a lot med. to put her to sleep   . DDD (degenerative disc disease) 09/17/2011  . Depression    "sometimes "  . DM (diabetes mellitus) ([place])   . Family history of anesthesia complication   . GERD (gastroesophageal reflux disease)   . Glaucoma    bilateral, pt. admits that she is noncompliant to eye gtts.   . Hemorrhoids   . Hyperlipidemia   . Hypertension    had stress, echo- 2006 /w [place], Cardiac Cath, per pt. 2002, echo repeated 2012- wnl   . Low back pain   . Shortness of breath   . Sleep apnea    uses c-pap- q night recently   Past Surgical History:  Procedure Laterality Date  . ABDOMINAL HYSTERECTOMY     ectopic, fibroids  . arm surgery Right   . CARDIAC CATHETERIZATION    . CATARACT EXTRACTION, BILATERAL     cataracts removed bilateral- ?IOL  . COLONOSCOPY     remote  . FISSURECTOMY  10/08/2011   Procedure: FISSURECTOMY;  Surgeon: Stark Klein, MD;  Location: [facility];  Service: General;  Laterality: N/A;  . FLEXIBLE SIGMOIDOSCOPY  02/25/2011   Procedure: FLEXIBLE SIGMOIDOSCOPY;  Surgeon: Inda Castle, MD;  Location: [facility];  Service: Endoscopy;  Laterality: N/A;  . FOOT SURGERY     bilat, heel spurs- screw in R foot   . HEMORRHOID SURGERY  10/08/2011   Procedure: HEMORRHOIDECTOMY;  Surgeon: Stark Klein, MD;  Location: [facility];  Service: General;  Laterality: N/A;  External   . SPHINCTEROTOMY  10/08/2011   Procedure: SPHINCTEROTOMY;  Surgeon: Stark Klein, MD;  Location: [facility];  Service: General;  Laterality: N/A;     Current Outpatient Medications  Medication Sig Dispense Refill  . albuterol (PROVENTIL HFA;VENTOLIN HFA) 108 (90 Base) MCG/ACT inhaler Inhale 1 puff into the lungs every 6 (six) hours as needed.     . ALPRAZolam (XANAX) 0.25 MG tablet Take 1 tablet (0.25 mg total) by mouth 3 (three) times daily as needed for anxiety. 90 tablet 0  . amLODipine (NORVASC) 10 MG tablet Take 1 tablet by mouth once daily 90  tablet 1  . atorvastatin (LIPITOR) 80 MG tablet Take 1 tablet by mouth once daily 90 tablet 1  . Continuous Blood Gluc Receiver (FREESTYLE LIBRE 14 DAY READER) DEVI 1 each by Does not apply route daily. 1 each 2  . Continuous Blood Gluc Sensor (FREESTYLE LIBRE 14 DAY SENSOR) MISC USE  ONCE DAILY 2 each 3  . cyclobenzaprine (FLEXERIL) 5 MG tablet TAKE 1 TABLET BY MOUTH TWICE DAILY AS NEEDED FOR MUSCLE SPASM 45 tablet 0  . diclofenac sodium (VOLTAREN) 1 % GEL Apply 2 g topically 4 (four) times daily. 1 Tube 1  . docusate sodium (COLACE) 100 MG capsule Take 1 capsule (100 mg total) by mouth 2 (two) times daily. 10 capsule 0  . furosemide (LASIX) 40 MG tablet Take 0.5 tablets (20 mg total) by mouth daily. 90 tablet 1  . hydrochlorothiazide (HYDRODIURIL) 25 MG tablet Take 1 tablet (25 mg total) by mouth daily. 90 tablet 1  . HYDROcodone-acetaminophen (NORCO) 10-325 MG tablet Take 1 tablet by mouth every 6 (six) hours as needed. 90 tablet 0  . lactulose (CONSTULOSE) 10 GM/15ML solution TAKE 30 MILLILITERS THREE TIMES DAILY BY  MOUTH AS NEEDED FOR MILD CONSTIPATION 473 mL 4  . losartan (COZAAR) 100 MG tablet Take 1 tablet by mouth once daily 90 tablet 1  . metFORMIN (GLUCOPHAGE XR) 500 MG 24 hr tablet Take 500 mg by mouth daily with breakfast.    . MULTAQ 400 MG tablet TAKE 1 TABLET BY MOUTH TWICE DAILY WITH A MEAL 180 tablet 0  . Pirfenidone (ESBRIET) 801 MG TABS Take 1 tablet by mouth 3 (three) times daily. 90 tablet 11  . polyethylene glycol powder (GLYCOLAX/MIRALAX) powder Take 17 g by mouth daily. 3350 g 1  . XARELTO 20 MG TABS tablet TAKE 1 TABLET BY MOUTH ONCE DAILY WITH SUPPER 90 tablet 1   No current facility-administered medications for this visit.    Allergies:   Fish oil, Other, Penicillins, Pneumococcal vaccines, Cashew nut oil, Nitrofurantoin macrocrystal, Peanut oil, Aspirin, Bactrim [sulfamethoxazole-trimethoprim], Ciprofloxacin, Ibuprofen, Influenza vaccines, Ivp dye [iodinated  diagnostic agents], Latex, Macrobid [nitrofurantoin monohydrate macrocrystals], and Shellfish allergy   Social History:  The patient  reports that she has never smoked. She has never used smokeless tobacco. She reports that she does not drink alcohol or use drugs.   Family History:  The patient's family history includes Arthritis/Rheumatoid in her sister; Asthma in her sister; Breast cancer in her mother and sister; COPD in her sister; Colon cancer in her brother and brother; Emphysema in her sister; Heart attack in her mother; Heart disease in her mother; Lung cancer in her sister.    ROS:  Please see the history of present illness.   Otherwise, review of systems is positive for none.   All other systems are reviewed and negative.  PHYSICAL EXAM: VS:  BP 134/60   Pulse (!) 59   Ht 5\' 5"  (1.651 m)   Wt 193 lb (87.5 kg)   BMI 32.12 kg/m  , BMI Body mass index is 32.12 kg/m. GEN: Well nourished, well developed, in no acute distress  HEENT: normal  Neck: no JVD, carotid bruits, or masses Cardiac: RRR; no murmurs, rubs, or gallops,no edema  Respiratory:  clear to auscultation bilaterally, normal work of breathing GI: soft, nontender, nondistended, + BS MS: no deformity or atrophy  Skin: warm and dry Neuro:  Strength and sensation are intact Psych: euthymic mood, full affect  EKG:  EKG is ordered today. Personal review of the ekg ordered shows sinus rhythm, rate 59  Recent Labs: 11/09/2018: ALT 25; BUN 20; Creatinine, Ser 0.98; Potassium 3.4; Sodium 138    Lipid Panel     Component Value Date/Time   CHOL 167 11/26/2017 0908   TRIG 75 11/26/2017 0908   HDL 61 11/26/2017 0908   CHOLHDL 2.7 11/26/2017 0908   CHOLHDL 4 06/01/2014 1145   VLDL 16.8 06/01/2014 1145   LDLCALC 91 11/26/2017 0908   LDLDIRECT 152.8 03/30/2011 0859     Wt Readings from Last 3 Encounters:  05/29/19 193 lb (87.5 kg)  05/12/19 190 lb 3.2 oz (86.3 kg)  12/21/18 200 lb 6.4 oz (90.9 kg)      Other  studies Reviewed: Additional studies/ records that were reviewed today include: TTE 10/19/17  Review of the above records today demonstrates:  - Left ventricle: The cavity size was normal. Systolic function was   normal. The estimated ejection fraction was in the range of 60%   to 65%. Wall motion was normal; there were no regional wall   motion abnormalities. Doppler parameters are consistent with   abnormal left ventricular relaxation (grade 1 diastolic   dysfunction). - Aortic valve: There was no significant regurgitation. - Mitral valve: There was trivial regurgitation. - Left atrium: The atrium was mildly dilated. - Right ventricle: The cavity size was mildly dilated. Wall   thickness was normal. - Tricuspid valve: There was trivial regurgitation. - Pulmonic valve: There was trivial regurgitation. - Inferior vena cava: The vessel was normal in size. The   respirophasic diameter changes were in the normal range (>= 50%),   consistent with normal central venous pressure.  Myoview 11/12/17  Nuclear stress EF: 66%.  There was no ST segment deviation noted during stress.  No T wave inversion was noted during stress.  The study is normal.  This is a low risk study.  The left ventricular ejection fraction is hyperdynamic (>65%).  ASSESSMENT AND PLAN:  1.  Paroxysmal atrial fibrillation: Currently on Xarelto and Multaq.  CHA2DS2-VASc of 4.  She remains in sinus rhythm.  She has had minimal episodes of atrial fibrillation.  Her episodes happen every few weeks and last up to 30 minutes.  No changes.   2.  Hypertension: Mildly elevated today but otherwise usually well controlled.  No changes.  3.  Hyperlipidemia: Currently on Lipitor with an LDL goal of 70 or less.  4.  Obstructive sleep apnea: CPAP compliance encouraged  5.  Coronary artery disease: Has a calcified LAD and circumflex found on CT scan.  No current chest pain  Current medicines are reviewed at length with the  patient today.   The patient does not have concerns regarding her medicines.  The following changes were made today: None  Labs/ tests ordered today include:  Orders Placed This  Encounter  Procedures  . EKG 12-Lead     Disposition:   FU with Merek Niu 6 months  Signed, Stephon Weathers Meredith Leeds, MD  05/29/2019 9:46 AM     [facility] [address] [place] [address] [phone] (office) [phone] (fax)

## 2019-06-06 ENCOUNTER — Other Ambulatory Visit: Payer: Self-pay | Admitting: Internal Medicine

## 2019-06-12 ENCOUNTER — Telehealth: Payer: Self-pay | Admitting: Pulmonary Disease

## 2019-06-12 MED ORDER — ESBRIET 801 MG PO TABS: 1.0000 | ORAL_TABLET | Freq: Three times a day (TID) | ORAL | 11 refills | Status: DC

## 2019-06-12 NOTE — Telephone Encounter (Signed)
Sent refill for pt's Esbriet to [facility] for pt. Attempted to call pt to let her know this had been done but unable to reach. Left pt a detailed message on machine letting her know this was done. Nothing further needed.

## 2019-06-14 ENCOUNTER — Other Ambulatory Visit: Payer: Self-pay | Admitting: Internal Medicine

## 2019-06-14 DIAGNOSIS — M25512 Pain in left shoulder: Secondary | ICD-10-CM

## 2019-06-14 DIAGNOSIS — I480 Paroxysmal atrial fibrillation: Secondary | ICD-10-CM

## 2019-06-14 DIAGNOSIS — G8929 Other chronic pain: Secondary | ICD-10-CM

## 2019-06-14 MED ORDER — CYCLOBENZAPRINE HCL 5 MG PO TABS: 5.0000 mg | ORAL_TABLET | Freq: Every day | ORAL | 0 refills | Status: DC

## 2019-06-14 NOTE — Telephone Encounter (Signed)
Medication: Cyclobenzaprine & Multaq   Pharmacy: [facility] ([place]) [place] [address]

## 2019-06-15 ENCOUNTER — Other Ambulatory Visit: Payer: Self-pay | Admitting: Internal Medicine

## 2019-06-15 ENCOUNTER — Other Ambulatory Visit: Payer: Self-pay

## 2019-06-15 DIAGNOSIS — I480 Paroxysmal atrial fibrillation: Secondary | ICD-10-CM

## 2019-06-15 MED ORDER — MULTAQ 400 MG PO TABS: ORAL_TABLET | ORAL | 3 refills | Status: DC

## 2019-06-15 NOTE — Telephone Encounter (Signed)
Multaq refill sent to pharmacy.

## 2019-06-15 NOTE — Telephone Encounter (Signed)
OK to refill Multaq  Zariana Strub [name] MD, [facility]

## 2019-06-15 NOTE — Addendum Note (Signed)
Addended by: Kathyrn Lass on: 06/15/2019 05:39 PM   Modules accepted: Orders

## 2019-06-16 ENCOUNTER — Other Ambulatory Visit: Payer: Self-pay | Admitting: Internal Medicine

## 2019-06-16 DIAGNOSIS — I480 Paroxysmal atrial fibrillation: Secondary | ICD-10-CM

## 2019-06-23 ENCOUNTER — Telehealth (INDEPENDENT_AMBULATORY_CARE_PROVIDER_SITE_OTHER): Payer: HMO | Admitting: Internal Medicine

## 2019-06-23 ENCOUNTER — Other Ambulatory Visit: Payer: Self-pay

## 2019-06-23 DIAGNOSIS — I480 Paroxysmal atrial fibrillation: Secondary | ICD-10-CM | POA: Diagnosis not present

## 2019-06-23 DIAGNOSIS — I10 Essential (primary) hypertension: Secondary | ICD-10-CM | POA: Diagnosis not present

## 2019-06-23 DIAGNOSIS — J4520 Mild intermittent asthma, uncomplicated: Secondary | ICD-10-CM

## 2019-06-23 DIAGNOSIS — R21 Rash and other nonspecific skin eruption: Secondary | ICD-10-CM

## 2019-06-23 MED ORDER — AMLODIPINE BESYLATE 10 MG PO TABS: 10.0000 mg | ORAL_TABLET | Freq: Every day | ORAL | 1 refills | Status: DC

## 2019-06-23 MED ORDER — FUROSEMIDE 40 MG PO TABS: 20.0000 mg | ORAL_TABLET | Freq: Every day | ORAL | 1 refills | Status: DC

## 2019-06-23 MED ORDER — LOSARTAN POTASSIUM 100 MG PO TABS: 100.0000 mg | ORAL_TABLET | Freq: Every day | ORAL | 1 refills | Status: DC

## 2019-06-23 MED ORDER — RIVAROXABAN 20 MG PO TABS: ORAL_TABLET | ORAL | 1 refills | Status: DC

## 2019-06-23 MED ORDER — ALBUTEROL SULFATE HFA 108 (90 BASE) MCG/ACT IN AERS: 1.0000 | INHALATION_SPRAY | Freq: Four times a day (QID) | RESPIRATORY_TRACT | 1 refills | Status: DC | PRN

## 2019-06-23 MED ORDER — FREESTYLE LIBRE 14 DAY READER DEVI: 1.0000 | Freq: Every day | 2 refills | Status: DC

## 2019-06-23 NOTE — Progress Notes (Signed)
Virtual Visit via Telephone Note  I connected with Miranda [name] on 06/23/19 at  1:00 PM EDT by telephone and verified that I am speaking with the correct person using two identifiers.   I discussed the limitations, risks, security and privacy concerns of performing an evaluation and management service by telephone and the availability of in person appointments. I also discussed with the patient that there may be a patient responsible charge related to this service. The patient expressed understanding and agreed to proceed.  Location patient: home Location provider: work office Participants present for the call: patient, provider Patient did not have a visit in the prior 7 days to address this/these issue(s).   History of Present Illness:  She has scheduled this visit for several issues:  1. Medication refills.  2. Rash of black patches over her arms and face. Would like to know what to do and is requesting a dermatology referral.  3. She is going for epidural injections and needs instructions on what to do with her Xarelto.   Observations/Objective: Patient sounds cheerful and well on the phone. I do not appreciate any increased work of breathing. Speech and thought processing are grossly intact. Patient reported vitals: None reported   Current Outpatient Medications:  .  albuterol (VENTOLIN HFA) 108 (90 Base) MCG/ACT inhaler, Inhale 1 puff into the lungs every 6 (six) hours as needed., Disp: 18 g, Rfl: 1 .  ALPRAZolam (XANAX) 0.25 MG tablet, Take 1 tablet (0.25 mg total) by mouth 3 (three) times daily as needed for anxiety., Disp: 90 tablet, Rfl: 0 .  amLODipine (NORVASC) 10 MG tablet, Take 1 tablet (10 mg total) by mouth daily., Disp: 90 tablet, Rfl: 1 .  atorvastatin (LIPITOR) 80 MG tablet, Take 1 tablet by mouth once daily, Disp: 90 tablet, Rfl: 1 .  Continuous Blood Gluc Receiver (FREESTYLE LIBRE 14 DAY READER) DEVI, 1 each by Does not apply route daily., Disp: 1  each, Rfl: 2 .  Continuous Blood Gluc Sensor (FREESTYLE LIBRE 14 DAY SENSOR) MISC, USE  ONCE DAILY, Disp: 2 each, Rfl: 0 .  cyclobenzaprine (FLEXERIL) 5 MG tablet, Take 1 tablet (5 mg total) by mouth at bedtime., Disp: 45 tablet, Rfl: 0 .  diclofenac sodium (VOLTAREN) 1 % GEL, Apply 2 g topically 4 (four) times daily., Disp: 1 Tube, Rfl: 1 .  docusate sodium (COLACE) 100 MG capsule, Take 1 capsule (100 mg total) by mouth 2 (two) times daily., Disp: 10 capsule, Rfl: 0 .  dronedarone (MULTAQ) 400 MG tablet, TAKE 1 TABLET BY MOUTH TWICE DAILY WITH A MEAL Filled by Dr [name], Disp: 180 tablet, Rfl: 3 .  furosemide (LASIX) 40 MG tablet, Take 0.5 tablets (20 mg total) by mouth daily., Disp: 90 tablet, Rfl: 1 .  hydrochlorothiazide (HYDRODIURIL) 25 MG tablet, Take 1 tablet (25 mg total) by mouth daily., Disp: 90 tablet, Rfl: 1 .  HYDROcodone-acetaminophen (NORCO) 10-325 MG tablet, Take 1 tablet by mouth every 6 (six) hours as needed., Disp: 90 tablet, Rfl: 0 .  lactulose (CONSTULOSE) 10 GM/15ML solution, TAKE 30 MILLILITERS THREE TIMES DAILY BY  MOUTH AS NEEDED FOR MILD CONSTIPATION, Disp: 473 mL, Rfl: 4 .  losartan (COZAAR) 100 MG tablet, Take 1 tablet (100 mg total) by mouth daily., Disp: 90 tablet, Rfl: 1 .  metFORMIN (GLUCOPHAGE XR) 500 MG 24 hr tablet, Take 500 mg by mouth daily with breakfast., Disp: , Rfl:  .  Pirfenidone (ESBRIET) 801 MG TABS, Take 1 tablet by  mouth 3 (three) times daily., Disp: 90 tablet, Rfl: 11 .  polyethylene glycol powder (GLYCOLAX/MIRALAX) powder, Take 17 g by mouth daily., Disp: 3350 g, Rfl: 1 .  rivaroxaban (XARELTO) 20 MG TABS tablet, TAKE 1 TABLET BY MOUTH ONCE DAILY WITH SUPPER, Disp: 90 tablet, Rfl: 1  Review of Systems:  Constitutional: Denies fever, chills, diaphoresis, appetite change and fatigue.  HEENT: Denies photophobia, eye pain, redness, hearing loss, ear pain, congestion, sore throat, rhinorrhea, sneezing, mouth sores, trouble swallowing, neck pain,  neck stiffness and tinnitus.   Respiratory: Denies SOB, DOE, cough, chest tightness,  and wheezing.   Cardiovascular: Denies chest pain, palpitations and leg swelling.  Gastrointestinal: Denies nausea, vomiting, abdominal pain, diarrhea, constipation, blood in stool and abdominal distention.  Genitourinary: Denies dysuria, urgency, frequency, hematuria, flank pain and difficulty urinating.  Endocrine: Denies: hot or cold intolerance, sweats, changes in hair or nails, polyuria, polydipsia. Musculoskeletal: Denies myalgias, back pain, joint swelling, arthralgias and gait problem.  Skin: Denies pallor, rash and wound.  Neurological: Denies dizziness, seizures, syncope, weakness, light-headedness, numbness and headaches.  Hematological: Denies adenopathy. Easy bruising, personal or family bleeding history  Psychiatric/Behavioral: Denies suicidal ideation, mood changes, confusion, nervousness, sleep disturbance and agitation   Assessment and Plan:  Rash of body  -Not able to assess on this phone visit. - Plan: Ambulatory referral to Dermatology  Essential hypertension -Per patient has been well controlled. -Will refill medications.  Paroxysmal atrial fibrillation (HCC)  - Plan: rivaroxaban (XARELTO) 20 MG TABS tablet -Advised to hold xarelto 5 days prior to procedure and to resume as soon as possible. -She understands stroke risk during this period of time.  Mild intermittent asthma without complication  - Plan: albuterol (VENTOLIN HFA) 108 (90 Base) MCG/ACT inhaler -Has been stable; no flares yet this year.    I discussed the assessment and treatment plan with the patient. The patient was provided an opportunity to ask questions and all were answered. The patient agreed with the plan and demonstrated an understanding of the instructions.   The patient was advised to call back or seek an in-person evaluation if the symptoms worsen or if the condition fails to improve as  anticipated.  I provided 21 minutes of non-face-to-face time during this encounter.   [name], MD [facility]

## 2019-07-02 ENCOUNTER — Other Ambulatory Visit: Payer: Self-pay | Admitting: Internal Medicine

## 2019-07-02 DIAGNOSIS — I10 Essential (primary) hypertension: Secondary | ICD-10-CM

## 2019-07-03 ENCOUNTER — Other Ambulatory Visit: Payer: Self-pay | Admitting: Internal Medicine

## 2019-07-03 DIAGNOSIS — M19012 Primary osteoarthritis, left shoulder: Secondary | ICD-10-CM

## 2019-07-03 DIAGNOSIS — G8929 Other chronic pain: Secondary | ICD-10-CM

## 2019-07-03 DIAGNOSIS — M545 Low back pain, unspecified: Secondary | ICD-10-CM

## 2019-07-03 NOTE — Telephone Encounter (Signed)
Pt call and state she need a refill on HYDROcodone-acetaminophen [facility]) 10 sent to  [facility] ([address]), [place] Phone:  [phone]  Fax:  [phone]    And she stated that she was sent to machine but not the patches to go on her arm.

## 2019-07-04 MED ORDER — HYDROCODONE-ACETAMINOPHEN 10-325 MG PO TABS: 1.0000 | ORAL_TABLET | Freq: Four times a day (QID) | ORAL | 0 refills | Status: AC | PRN

## 2019-07-04 MED ORDER — HYDROCODONE-ACETAMINOPHEN 10-325 MG PO TABS: 1.0000 | ORAL_TABLET | Freq: Four times a day (QID) | ORAL | 0 refills | Status: DC | PRN

## 2019-07-04 MED ORDER — FREESTYLE LIBRE 14 DAY SENSOR MISC: 2 refills | Status: DC

## 2019-07-04 NOTE — Addendum Note (Signed)
Addended by: Westley Hummer B on: 07/04/2019 04:11 PM   Modules accepted: Orders

## 2019-07-04 NOTE — Telephone Encounter (Signed)
Patient had an appointment 06/23/19.    Patient requests a refill of:  Hydrocodone Lactulose Esbriet  Okay to fill?

## 2019-07-04 NOTE — Telephone Encounter (Signed)
Pt called to make sure that she gets the patches for her arm for the [employer] machine. And to make sure she didn't miss a call from [place]

## 2019-07-10 ENCOUNTER — Telehealth: Payer: Self-pay | Admitting: Pulmonary Disease

## 2019-07-10 ENCOUNTER — Telehealth: Payer: Self-pay | Admitting: Internal Medicine

## 2019-07-10 DIAGNOSIS — E785 Hyperlipidemia, unspecified: Secondary | ICD-10-CM

## 2019-07-10 DIAGNOSIS — G8929 Other chronic pain: Secondary | ICD-10-CM

## 2019-07-10 DIAGNOSIS — M19012 Primary osteoarthritis, left shoulder: Secondary | ICD-10-CM

## 2019-07-10 DIAGNOSIS — E1169 Type 2 diabetes mellitus with other specified complication: Secondary | ICD-10-CM

## 2019-07-10 DIAGNOSIS — M545 Low back pain, unspecified: Secondary | ICD-10-CM

## 2019-07-10 DIAGNOSIS — I10 Essential (primary) hypertension: Secondary | ICD-10-CM

## 2019-07-10 MED ORDER — ESBRIET 801 MG PO TABS: 1.0000 | ORAL_TABLET | Freq: Three times a day (TID) | ORAL | 11 refills | Status: DC

## 2019-07-10 MED ORDER — ESBRIET 801 MG PO TABS: 801.0000 mg | ORAL_TABLET | Freq: Three times a day (TID) | ORAL | 5 refills | Status: DC

## 2019-07-10 NOTE — Telephone Encounter (Signed)
Tried again to send rx electronically and received error message stating that the rx could not be sent.  Maxwell to check pt's esbriet rx as we keep getting an error message when trying to send rx electronically. When Gerald Stabs verified rx, he saw that there was an rx on file from February 2021 that had refills on there. He stated that they also received a paid claim so pt can now call to receive a shipment of med.  Called and spoke with pt letting her know this info and she verbalized understanding. Nothing further needed.

## 2019-07-10 NOTE — Chronic Care Management (AMB) (Signed)
  Chronic Care Management   Note  07/10/2019 Name: Miranda [name] MRN: [id number] DOB: 1946/01/09  Miranda [name] is a 74 y.o. year old female who is a primary care patient of [name], [name], MD. I reached out to Miranda [name] by phone today in response to a referral sent by Ms. Kyana L Garverick's PCP, [name], [name], MD.   [name] was given information about Chronic Care Management services today including:  1. CCM service includes personalized support from designated clinical staff supervised by her physician, including individualized plan of care and coordination with other care providers 2. 24/7 contact phone numbers for assistance for urgent and routine care needs. 3. Service will only be billed when office clinical staff spend 20 minutes or more in a month to coordinate care. 4. Only one practitioner may furnish and bill the service in a calendar month. 5. The patient may stop CCM services at any time (effective at the end of the month) by phone call to the office staff.   Patient agreed to services and verbal consent obtained.    This note is not being shared with the patient for the following reason: To respect privacy (The patient or proxy has requested that the information not be shared). Follow up plan:   Raynicia Dukes [name] Scheduler

## 2019-07-10 NOTE — Telephone Encounter (Signed)
Looking at pt's med list, it looks like we sent a refill of pt's Esbriet to [facility] electronically on 06/12/19. Called and spoke with pt in regards to this and asked if she had heard anything from them about a refill and she stated she has not heard anything.  Amber/Rachael, can you look into this for [place] please?

## 2019-07-10 NOTE — Telephone Encounter (Signed)
Last rx Esbriet e-scribed says it did not go through. Please resend patient's prescription to [facility].

## 2019-07-10 NOTE — Telephone Encounter (Signed)
Medication: Esbriet.  Pharmacy: [facility]  Pt informed that pulmonary dr ofc advised her pcp had to orde medication.   Pt also would like to know when she is due for a refill on her pain medication.

## 2019-07-11 MED ORDER — HYDROCODONE-ACETAMINOPHEN 10-325 MG PO TABS: 1.0000 | ORAL_TABLET | Freq: Four times a day (QID) | ORAL | 0 refills | Status: AC | PRN

## 2019-07-11 MED ORDER — HYDROCODONE-ACETAMINOPHEN 10-325 MG PO TABS: 1.0000 | ORAL_TABLET | Freq: Four times a day (QID) | ORAL | 0 refills | Status: DC | PRN

## 2019-07-11 NOTE — Telephone Encounter (Signed)
1.  Esbriet  Refill should come from pulmonology?    2.  Last refill for hydrocodone 06/23/19.  Refills pending

## 2019-07-11 NOTE — Telephone Encounter (Signed)
Yes, from pulmonary

## 2019-07-12 ENCOUNTER — Telehealth: Payer: Self-pay | Admitting: Pulmonary Disease

## 2019-07-12 NOTE — Telephone Encounter (Signed)
LMTCB x1 for pt.  

## 2019-07-13 NOTE — Telephone Encounter (Signed)
Called and spoke with patient and she stated that she talked to the people who she gets Esbriet from and they are sending her a shipment and she should have it by Saturday. Told patient if she didn't receive anything over the weekend to call and let us know. Patient expressed understanding. Nothing further needed at this time.

## 2019-08-02 ENCOUNTER — Other Ambulatory Visit: Payer: Self-pay | Admitting: Internal Medicine

## 2019-08-02 DIAGNOSIS — M5136 Other intervertebral disc degeneration, lumbar region: Secondary | ICD-10-CM | POA: Diagnosis not present

## 2019-08-02 DIAGNOSIS — M5412 Radiculopathy, cervical region: Secondary | ICD-10-CM | POA: Diagnosis not present

## 2019-08-02 DIAGNOSIS — M5030 Other cervical disc degeneration, unspecified cervical region: Secondary | ICD-10-CM | POA: Diagnosis not present

## 2019-08-04 NOTE — Addendum Note (Signed)
Addended by: Westley Hummer B on: 08/04/2019 08:44 AM   Modules accepted: Orders

## 2019-08-07 ENCOUNTER — Ambulatory Visit: Payer: HMO

## 2019-08-07 ENCOUNTER — Other Ambulatory Visit: Payer: Self-pay

## 2019-08-07 DIAGNOSIS — I10 Essential (primary) hypertension: Secondary | ICD-10-CM

## 2019-08-07 DIAGNOSIS — E1169 Type 2 diabetes mellitus with other specified complication: Secondary | ICD-10-CM

## 2019-08-07 DIAGNOSIS — K5903 Drug induced constipation: Secondary | ICD-10-CM

## 2019-08-07 DIAGNOSIS — E782 Mixed hyperlipidemia: Secondary | ICD-10-CM

## 2019-08-07 DIAGNOSIS — F419 Anxiety disorder, unspecified: Secondary | ICD-10-CM

## 2019-08-07 DIAGNOSIS — I480 Paroxysmal atrial fibrillation: Secondary | ICD-10-CM

## 2019-08-07 DIAGNOSIS — E785 Hyperlipidemia, unspecified: Secondary | ICD-10-CM

## 2019-08-07 NOTE — Chronic Care Management (AMB) (Signed)
Chronic Care Management Pharmacy  Name: NATALIJA ORFANOS  MRN: [id number] DOB: 1945/07/30  Initial Questions: 1. Have you seen any other providers since your last visit? NA 2. Any changes in your medicines or health? No   Chief Complaint/ HPI [name],  74 y.o. , female presents for their Initial CCM visit with the clinical pharmacist via telephone due to COVID-19 Pandemic.  PCP : [name], [name], MD  Their chronic conditions include: Afib, DM, HTN, HLD, IPF/ asthma, fluid retention, low back pain, anxiety, constipation   Office Visits: 06/23/2019- [name], MD- patient presented for virtual visit for medication refills, rash of black patches over arms and face, and instructions on Xarelto. Due to being a telephone visit, patient was referred to dermatology. Patient instructed to hold Xarelto 5 days prior to procedure and to resume as soon as possible. Medications were refilled.   03/28/2019- [name], MD- patient presented for virtual visit for medication refill and instruction on Xarelto hold due to upcoming dental surgery. Patient instructed can go off Xarelto 5 days prior to procedure and resume after surgery. Hydrocodone/ APAP 10/325mg  refilled.   Consult Visit: 07/29/2019- Cardiology- Allegra Lai, MD- patient presented for office visit for afib evaluation (electrophysiology evaluation). Patient remains in sinus rhythm. Episodes occur every few weeks. BP mildly elevated. Coronary artery disease- patient has calcified LAD and circumflex on CT scan- patient denied chest pain. No changes made. Patient to follow up in 6 months.   05/12/19- Cardiology- Margreta Journey, PA- patient presented for virtual visit for follow up. Patient to continue current BP medications. If SBP keeps going up to 150s, plan to add another BP medication. No medication changes. Patient to follow up with Dr. Curt Bears in March and follow up with Dr. [name] in 1 year.    01/17/2019- Pulmonology- Geraldo Pitter, NP- Patient presented for office visit for idiopathic pulmonary fibrosis. Patient had PFTs. Patient reports feeling well and OV was canceled and will follow up in April.   12/21/2018- Pulmonology- [name], MD- patient presented for office visit for interstitial lung disease and OSA follow up. Patient presentation consistent with IPF. Lung function and CT scan stable on [place]. Continue current therapy. Patient encouraged to use CPAP everyday. Patient to follow up in6 months with spirometry diffusion capacity and 6-minute walk test.   Medications: Outpatient Encounter Medications as of 08/07/2019  Medication Sig Note  . ACETAMINOPHEN PO Take by mouth as needed (pain).   Marland Kitchen albuterol (VENTOLIN HFA) 108 (90 Base) MCG/ACT inhaler Inhale 1 puff into the lungs every 6 (six) hours as needed.   . ALPRAZolam (XANAX) 0.25 MG tablet Take 1 tablet (0.25 mg total) by mouth 3 (three) times daily as needed for anxiety.   Marland Kitchen amLODipine (NORVASC) 10 MG tablet Take 1 tablet (10 mg total) by mouth daily.   Marland Kitchen atorvastatin (LIPITOR) 80 MG tablet Take 1 tablet by mouth once daily   . Continuous Blood Gluc Receiver (FREESTYLE LIBRE 14 DAY READER) DEVI 1 each by Does not apply route daily.   . Continuous Blood Gluc Sensor (FREESTYLE LIBRE 14 DAY SENSOR) MISC USE  ONCE DAILY   . cyclobenzaprine (FLEXERIL) 5 MG tablet Take 1 tablet (5 mg total) by mouth at bedtime.   . diclofenac sodium (VOLTAREN) 1 % GEL Apply 2 g topically 4 (four) times daily.   Marland Kitchen docusate sodium (COLACE) 100 MG capsule Take 1 capsule (100 mg total) by mouth 2 (two) times daily. 03/28/2019: Takes as needed.  Marland Kitchen  dronedarone (MULTAQ) 400 MG tablet TAKE 1 TABLET BY MOUTH TWICE DAILY WITH A MEAL Filled by Dr [name]   . furosemide (LASIX) 40 MG tablet Take 0.5 tablets (20 mg total) by mouth daily.   . hydrochlorothiazide (HYDRODIURIL) 25 MG tablet Take 1 tablet (25 mg total) by mouth daily.   Marland Kitchen  HYDROcodone-acetaminophen (NORCO) 10-325 MG tablet Take 1 tablet by mouth every 6 (six) hours as needed.   . lactulose (CONSTULOSE) 10 GM/15ML solution TAKE 30 ML BY MOUTH  THREE TIMES DAILY AS NEEDED FOR MILD CONSTIPATION   . losartan (COZAAR) 100 MG tablet Take 1 tablet by mouth once daily   . metFORMIN (GLUCOPHAGE XR) 500 MG 24 hr tablet Take 500 mg by mouth daily with breakfast.   . Pirfenidone 801 MG TABS Take 801 mg by mouth in the morning, at noon, and at bedtime.   . polyethylene glycol powder (GLYCOLAX/MIRALAX) powder Take 17 g by mouth daily. 03/28/2019: Takes as needed.  . rivaroxaban (XARELTO) 20 MG TABS tablet TAKE 1 TABLET BY MOUTH ONCE DAILY WITH SUPPER    No facility-administered encounter medications on file as of 08/07/2019.    Current Diagnosis/Assessment:  Goals Addressed            This Visit's Progress   . Pharmacy Care Plan       CARE PLAN ENTRY  Current Barriers:  . Chronic Disease Management support, education, and care coordination needs related to Hypertension, Hyperlipidemia, Diabetes, Atrial Fibrillation, Anxiety, and Constipation   Hypertension . Pharmacist Clinical Goal(s): o Over the next 90 days, patient will work with PharmD and providers to maintain BP goal <140/90 . Current regimen:   Amlodipine 10mg , 1 tablet once daily  Hydrochlorothiazide 25mg , 1 tablet once daily  Losartan 100mg , 1 tablet once daily . Interventions: o We discussed: DASH diet:  following a diet emphasizing fruits and vegetables and low-fat dairy products along with whole grains, fish, poultry, and nuts. Reducing red meats and sugars.  . Patient self care activities - Over the next 90 days, patient will: o Check BP daily, document, and provide at future appointments o Ensure daily salt intake < 2300 mg/day  Hyperlipidemia . Pharmacist Clinical Goal(s): o Over the next 90 days, patient will work with PharmD and providers to achieve LDL goal < 70 . Current regimen:   o Atorvastatin 80mg , 1 tablet once daily  . Interventions: . We discussed how a diet high in plant sterols (fruits/vegetables/nuts/whole grains/legumes) may reduce your cholesterol.  Encouraged increasing fiber to a daily intake of 10-25g/day  . Patient self care activities - Over the next 90 days, patient will: o Schedule yearly physical with Dr. Jerilee Hoh (overdue for lipid panel).  o Confirm current supply of atorvastatin and request refill if needed.   Diabetes . Pharmacist Clinical Goal(s): o Over the next 90 days, patient will work with PharmD and providers to maintain A1c goal <7% . Current regimen:  o Metformin ER 500mg , 1 tablet once daily with breakfast  Patient self care activities - Over the next 90 days, patient will: o Check blood sugar with Freestyle Libre, document, and provide at future appointments o Contact provider with any episodes of hypoglycemia  Atrial fibrillation . Pharmacist Clinical Goal(s) o Over the next 90 days, patient will work with PharmD and providers to maintain sinus rhythm . Current regimen:  o dronedarone (Multaq) 400mg , 1 tablet twice daily with a meal  o rivaroxaban (Xarelto) 20mg , 1 tablet once daily with supper (blood clot  prevention)  . Interventions: o We discussed:  monitoring for signs and symptoms for bleeding (coughing up blood, prolonged nose bleeds, black, tarry stools). . Patient self care activities - Over the next 90 days, patient will: o Continue current medications.   Anxiety . Pharmacist Clinical Goal(s) o Over the next 90 days, patient will work with PharmD and providers to improve in anxiety symptoms . Current regimen:   Alprazolam 0.25mg ,  1 tablet three times daily as needed for anxiety . Interventions: o Discussed medications that are long duration for anxiety.  . Patient self care activities o Patient will continue current medications and follow up if needed.  Constipation . Pharmacist Clinical Goal(s) o Over the  next 90 days, patient will work with PharmD and providers to improve constipation.  . Current regimen:   docuste 100mg , 1 capsule twice daily  Lactulose solution, take 30 ML three times daily as needed for mild constipation . Interventions: o Discussed Linzess as an option. Copay: $45/ month and a tier exception can be made with insurance plan requesting a lower copay.  . Patient self care activities o Patient will continue current regimen and report back as needed.    Medication management . Pharmacist Clinical Goal(s): o Over the next 90 days, patient will work with PharmD and providers to achieve optimal medication adherence . Current pharmacy: [facility] (for Pirfenidone 801mg ) . Interventions o Comprehensive medication review performed. o Continue current medication management strategy . Patient self care activities - Over the next 90 days, patient will: o Focus on medication adherence by verifying supply of atorvastatin and include in daily pill box if not done so already.  o Take medications as prescribed o Report any questions or concerns to PharmD and/or provider(s)  Initial goal documentation       SDOH Interventions     Most Recent Value  SDOH Interventions  Financial Strain Interventions  Intervention Not Indicated  Transportation Interventions  Intervention Not Indicated       AFIB  Patient reported being on Xarelto due to history of PE.    Patient is currently rhythm controlled. HR: 60-65-70 BPM  Patient has failed these meds in past: none   Patient is currently controlled on the following medications:   dronedarone (Multaq) 400mg , 1 tablet twice daily with a meal   Anticoagulation  rivaroxaban (Xarelto) 20mg , 1 tablet once daily with supper    We discussed:  monitoring for signs and symptoms for bleeding (coughing up blood, prolonged nose bleeds, black, tarry stools).  Plan Continue current medications  Diabetes   Recent Relevant  Labs: Lab Results  Component Value Date/Time   HGBA1C 5.9 (A) 10/19/2018 09:51 AM   HGBA1C 6.0 02/02/2018 12:20 PM   HGBA1C 5.9 (A) 09/22/2017 10:13 AM   HGBA1C 6.5 (H) 08/30/2010 07:10 AM    Kidney Function Lab Results  Component Value Date/Time   CREATININE 0.98 11/09/2018 10:20 AM   CREATININE 0.74 07/14/2018 09:32 AM   GFR 67.48 11/09/2018 10:20 AM   GFRNONAA >60 11/04/2017 04:10 AM   GFRAA >60 11/04/2017 04:10 AM   Checking BG: Daily  Patient has CGM.   Patient has failed these meds in past: none   Patient is currently controlled on the following medications:   Metformin ER 500mg , 1 tablet once daily with breakfast  (patient reported will take additional dose if BGs are "high" Unable to define high. She reports taking a 2nd dose about once every month)   Last diabetic Eye exam:  No results found for: HMDIABEYEEXA  - patient reports last time was last year and states goes every 6 months due to glaucoma   Last diabetic Foot exam: No results found for: HMDIABFOOTEX  - denies persistent neuropathy  We discussed: diet and exercise extensively and how to recognize and treat signs of hypoglycemia  - she reports having sx of low BGs in the past with 2nd dose of metformin.   Plan Continue current medications,    Hypertension   Office blood pressures are  BP Readings from Last 3 Encounters:  05/29/19 134/60  05/12/19 (!) 141/71  12/21/18 132/80   Patient has failed these meds in the past: benazepril, metoprolol   Patient checks BP at home daily  Patient home BP readings are ranging: 132/60, 140/70; patient reports SBP can go up to: 165-170 (at times when she is stressed)   Patient is controlled on:   Amlodipine 10mg , 1 tablet once daily  Hydrochlorothiazide 25mg , 1 tablet once daily  Losartan 100mg , 1 tablet once daily  We discussed diet and exercise extensively  - diet: patient reports working on it and making changes to include healthier food options. -  -  exercise- patient reports she walks daily, but further exercise is limited by back pain    Plan Continue current medications   Hyperlipidemia   Lipid Panel     Component Value Date/Time   CHOL 167 11/26/2017 0908   TRIG 75 11/26/2017 0908   HDL 61 11/26/2017 0908   CHOLHDL 2.7 11/26/2017 0908   CHOLHDL 4 06/01/2014 1145   VLDL 16.8 06/01/2014 1145   LDLCALC 91 11/26/2017 0908   LDLDIRECT 152.8 03/30/2011 0859   LABVLDL 15 11/26/2017 0908   The 10-year ASCVD risk score (Goff DC Jr., et al., 2013) is: 27.3%   Values used to calculate the score:     Age: 38 years     Sex: Female     Is Non-Hispanic African American: Yes     Diabetic: Yes     Tobacco smoker: No     Systolic Blood Pressure: [id number] mmHg     Is BP treated: Yes     HDL Cholesterol: 61 mg/dL     Total Cholesterol: 167 mg/dL   Patient has failed these meds in past: lovastatin   Patient is currently uncontrolled on the following medications:  Atorvastatin 80mg , 1 tablet once daily   We discussed:   - LDL goal <70  - adherence: last fill date: 02/2019 for 90 day supply (patient unable to confirm since she was not at home and did not have medications.  - diet and exercise  - needs updated lipid panel   Plan Advised patient to check current supply of atorvastatin and get refill if not found.  Recommend lipid panel at next physical.  Patient to schedule physical with Dr. Jerilee Hoh around August.  Continue current medications  IPF/ asthma  Patient reports stable breathing.   Eosinophil count:   Lab Results  Component Value Date/Time   EOSPCT 3 11/02/2017 11:04 PM  %                               Eos (Absolute):  Lab Results  Component Value Date/Time   EOSABS 0.2 11/02/2017 11:04 PM    Tobacco Status:  Social History   Tobacco Use  Smoking Status Never Smoker  Smokeless Tobacco Never Used   Patient has failed these meds in  past: none   Patient is currently controlled on the following medications:    Albuterol HFA 139mcg, act inhaler, inhale 1 puff every six hours as needed   Pirfenidone 801mg , 1 tablet in the morning, at noon, and at bedtime   Using maintenance inhaler regularly? NA- not prescribed   Frequency of rescue inhaler use:  prn- patient reports use of albuterol inhaler less than every week.   Plan Continue current medications   Fluid retention   Patient reports seeing fluid retention in ankles and evident when she has shortness of breath. Currently, she states it is controlled.   Patient is currently controlled on the following medications:  Furosemide 40mg  0.5 tables once daily (for fluid retention).  Plan Continue current medications   Low back pain  Patient stated she is scheduled to get injections for pain; pending insurance approval.  Patient not at home and unable to confirm APAP strength.   Patient has failed these meds in past: meloxicam, naproxen, oxycodone  Patient is currently controlled on the following medications:   Hydrocodone/ APAP 10/325mg , 1 tablet every six hours as needed (takes as indicated)  Cyclobenzaprine 5mg ,1 tablet at bedtime   Diclofenac 1% gel, apply 2 g four times daily   APAP, 1 tablet as needed for pain   We discussed:  - caution use of APAP from multiple medications (maximum dose of APAP: 3g/ day).  - discussed duration of hydrocodone/ APAP prescription and when next refill is due.   Plan Continue current medications  Anxiety   Patient reports taking everyday; at most takes twice daily.   Denies drowsiness. Reports helps her calm down.    Patient has failed these meds in past: none   Patient is currently controlled on the following medications:   Alprazolam 0.25mg ,  1 tablet three times daily as needed for anxiety  We discussed:  caution use of benzodiazepines in older adults  - discussed other options that are longer duration for anxiety   Plan Continue current medications  Readdress at follow up.    Constipation  Describes constipation as straining and feels a blockage. Patient reports being on different things in the past, and was using an enema which allowed her to use the bathroom. If does not go 2 to 3 days, reports pain.   Patient reporting using Linzess (from sister) and felt relief and wanted to see if it is an option for her. Discussed previous use and was an option by Dr. Jerilee Hoh. Upon formulary review, copay: $45/ month. Patient changed mind; does not want added cost. Patient preferred to continue on current regimen and actually being consistent on it. She notes she has issues when she stops docusate and lactulose.   Patient is on long term opioids which can contribute to constipation. Unclear history of senna which can be another option for her  Patient has failed these meds in past: bisacodyl, Linzess (previous loose stools)   Patient is currently controlled on the following medications:   docuste 100mg , 1 capsule twice daily  Lactulose solution, take 30 ML three times daily as needed for mild constipation  Miralax, 17g one daily (not a part of daily regimen)   We discussed:  - tier exception for Linzess (patient deferred to continue current regimen)   Plan Continue current medications  Recommend Senna.   Medication Management  Patient organizes medications: uses pill box  Primary pharmacy: [facility]  Adherence: Patient reports rarely skipping dose. (could not define) - atorvastatin 80mg , last filled 02/20/2019  for 90DS --- patient reports not having medications with her since she was not at home. She is going off of a med list. Advised patient to check current supply and refills can be requested if needed.  - Multaq 400mg , 03/13/2019, 06/16/2019 for 90DS   Follow- up Follow up visit with PharmD in 3 months. Recommend physical in August (due for lipid panel)    Anson Crofts, PharmD Clinical Pharmacist [facility]  [place] [phone]

## 2019-08-07 NOTE — Patient Instructions (Signed)
Visit Information  Goals Addressed            This Visit's Progress   . Pharmacy Care Plan       CARE PLAN ENTRY  Current Barriers:  . Chronic Disease Management support, education, and care coordination needs related to Hypertension, Hyperlipidemia, Diabetes, Atrial Fibrillation, Anxiety, and Constipation   Hypertension . Pharmacist Clinical Goal(s): o Over the next 90 days, patient will work with PharmD and providers to maintain BP goal <140/90 . Current regimen:   Amlodipine 10mg , 1 tablet once daily  Hydrochlorothiazide 25mg , 1 tablet once daily  Losartan 100mg , 1 tablet once daily . Interventions: o We discussed: DASH diet:  following a diet emphasizing fruits and vegetables and low-fat dairy products along with whole grains, fish, poultry, and nuts. Reducing red meats and sugars.  . Patient self care activities - Over the next 90 days, patient will: o Check BP daily, document, and provide at future appointments o Ensure daily salt intake < 2300 mg/day  Hyperlipidemia . Pharmacist Clinical Goal(s): o Over the next 90 days, patient will work with PharmD and providers to achieve LDL goal < 70 . Current regimen:  o Atorvastatin 80mg , 1 tablet once daily  . Interventions: . We discussed how a diet high in plant sterols (fruits/vegetables/nuts/whole grains/legumes) may reduce your cholesterol.  Encouraged increasing fiber to a daily intake of 10-25g/day  . Patient self care activities - Over the next 90 days, patient will: o Schedule yearly physical with Dr. Jerilee Hoh (overdue for lipid panel).  o Confirm current supply of atorvastatin and request refill if needed.   Diabetes . Pharmacist Clinical Goal(s): o Over the next 90 days, patient will work with PharmD and providers to maintain A1c goal <7% . Current regimen:  o Metformin ER 500mg , 1 tablet once daily with breakfast  Patient self care activities - Over the next 90 days, patient will: o Check blood sugar with  Freestyle Libre, document, and provide at future appointments o Contact provider with any episodes of hypoglycemia  Atrial fibrillation . Pharmacist Clinical Goal(s) o Over the next 90 days, patient will work with PharmD and providers to maintain sinus rhythm . Current regimen:  o dronedarone (Multaq) 400mg , 1 tablet twice daily with a meal  o rivaroxaban (Xarelto) 20mg , 1 tablet once daily with supper (blood clot prevention)  . Interventions: o We discussed:  monitoring for signs and symptoms for bleeding (coughing up blood, prolonged nose bleeds, black, tarry stools). . Patient self care activities - Over the next 90 days, patient will: o Continue current medications.   Anxiety . Pharmacist Clinical Goal(s) o Over the next 90 days, patient will work with PharmD and providers to improve in anxiety symptoms . Current regimen:   Alprazolam 0.25mg ,  1 tablet three times daily as needed for anxiety . Interventions: o Discussed medications that are long duration for anxiety.  . Patient self care activities o Patient will continue current medications and follow up if needed.  Constipation . Pharmacist Clinical Goal(s) o Over the next 90 days, patient will work with PharmD and providers to improve constipation.  . Current regimen:   docuste 100mg , 1 capsule twice daily  Lactulose solution, take 30 ML three times daily as needed for mild constipation . Interventions: o Discussed Linzess as an option. Copay: $45/ month and a tier exception can be made with insurance plan requesting a lower copay.  . Patient self care activities o Patient will continue current regimen and report back as  needed.    Medication management . Pharmacist Clinical Goal(s): o Over the next 90 days, patient will work with PharmD and providers to achieve optimal medication adherence . Current [facility]: [facility] (for Pirfenidone 801mg ) . Interventions o Comprehensive medication review  performed. o Continue current medication management strategy . Patient self care activities - Over the next 90 days, patient will: o Focus on medication adherence by verifying supply of atorvastatin and include in daily pill box if not done so already.  o Take medications as prescribed o Report any questions or concerns to PharmD and/or provider(s)  Initial goal documentation        Miranda [name] was given information about Chronic Care Management services today including:  1. CCM service includes personalized support from designated clinical staff supervised by her physician, including individualized plan of care and coordination with other care providers 2. 24/7 contact phone numbers for assistance for urgent and routine care needs. 3. Standard insurance, coinsurance, copays and deductibles apply for chronic care management only during months in which we provide at least 20 minutes of these services. Most insurances cover these services at 100%, however patients may be responsible for any copay, coinsurance and/or deductible if applicable. This service may help you avoid the need for more expensive face-to-face services. 4. Only one practitioner may furnish and bill the service in a calendar month. 5. The patient may stop CCM services at any time (effective at the end of the month) by phone call to the office staff.  Patient agreed to services and verbal consent obtained.   The patient verbalized understanding of instructions provided today and agreed to receive a mailed copy of patient instruction and/or educational materials. Telephone follow up appointment with pharmacy team member scheduled for: 11/10/2019  Anson Crofts, PharmD Clinical Pharmacist [facility] [place] [phone]   [place] stands for "Dietary Approaches to Stop Hypertension." The DASH eating plan is a healthy eating plan that has been shown to reduce high blood pressure  (hypertension). It may also reduce your risk for type 2 diabetes, heart disease, and stroke. The DASH eating plan may also help with weight loss. What are tips for following this plan?  General guidelines  Avoid eating more than 2,300 mg (milligrams) of salt (sodium) a day. If you have hypertension, you may need to reduce your sodium intake to 1,500 mg a day.  Limit alcohol intake to no more than 1 drink a day for nonpregnant women and 2 drinks a day for men. One drink equals 12 oz of beer, 5 oz of wine, or 1 oz of hard liquor.  Work with your health care provider to maintain a healthy body weight or to lose weight. Ask what an ideal weight is for you.  Get at least 30 minutes of exercise that causes your heart to beat faster (aerobic exercise) most days of the week. Activities may include walking, swimming, or biking.  Work with your health care provider or diet and nutrition specialist (dietitian) to adjust your eating plan to your individual calorie needs. Reading food labels   Check food labels for the amount of sodium per serving. Choose foods with less than 5 percent of the Daily Value of sodium. Generally, foods with less than 300 mg of sodium per serving fit into this eating plan.  To find whole grains, look for the word "whole" as the first word in the ingredient list. Shopping  Buy products labeled as "low-sodium" or "  no salt added."  Buy fresh foods. Avoid canned foods and premade or frozen meals. Cooking  Avoid adding salt when cooking. Use salt-free seasonings or herbs instead of table salt or sea salt. Check with your health care provider or pharmacist before using salt substitutes.  Do not fry foods. Cook foods using healthy methods such as baking, boiling, grilling, and broiling instead.  Cook with heart-healthy oils, such as olive, canola, soybean, or sunflower oil. Meal planning  Eat a balanced diet that includes: ? 5 or more servings of fruits and vegetables  each day. At each meal, try to fill half of your plate with fruits and vegetables. ? Up to 6-8 servings of whole grains each day. ? Less than 6 oz of lean meat, poultry, or fish each day. A 3-oz serving of meat is about the same size as a deck of cards. One egg equals 1 oz. ? 2 servings of low-fat dairy each day. ? A serving of nuts, seeds, or beans 5 times each week. ? Heart-healthy fats. Healthy fats called Omega-3 fatty acids are found in foods such as flaxseeds and coldwater fish, like sardines, salmon, and mackerel.  Limit how much you eat of the following: ? Canned or prepackaged foods. ? Food that is high in trans fat, such as fried foods. ? Food that is high in saturated fat, such as fatty meat. ? Sweets, desserts, sugary drinks, and other foods with added sugar. ? Full-fat dairy products.  Do not salt foods before eating.  Try to eat at least 2 vegetarian meals each week.  Eat more home-cooked food and less restaurant, buffet, and fast food.  When eating at a restaurant, ask that your food be prepared with less salt or no salt, if possible. What foods are recommended? The items listed may not be a complete list. Talk with your dietitian about what dietary choices are best for you. Grains Whole-grain or whole-wheat bread. Whole-grain or whole-wheat pasta. Brown rice. [place]. Bulgur. Whole-grain and low-sodium cereals. Pita bread. Low-fat, low-sodium crackers. Whole-wheat flour tortillas. Vegetables Fresh or frozen vegetables (raw, steamed, roasted, or grilled). Low-sodium or reduced-sodium tomato and vegetable juice. Low-sodium or reduced-sodium tomato sauce and tomato paste. Low-sodium or reduced-sodium canned vegetables. Fruits All fresh, dried, or frozen fruit. Canned fruit in natural juice (without added sugar). Meat and other protein foods Skinless chicken or [place]. Ground chicken or [place]. Pork with fat trimmed off. Fish and seafood. Egg whites. Dried beans,  peas, or lentils. Unsalted nuts, nut butters, and seeds. Unsalted canned beans. Lean cuts of beef with fat trimmed off. Low-sodium, lean deli meat. Dairy Low-fat (1%) or fat-free (skim) milk. Fat-free, low-fat, or reduced-fat cheeses. Nonfat, low-sodium ricotta or cottage cheese. Low-fat or nonfat yogurt. Low-fat, low-sodium cheese. Fats and oils Soft margarine without trans fats. Vegetable oil. Low-fat, reduced-fat, or light mayonnaise and salad dressings (reduced-sodium). Canola, safflower, olive, soybean, and sunflower oils. Avocado. Seasoning and other foods Herbs. Spices. Seasoning mixes without salt. Unsalted popcorn and pretzels. Fat-free sweets. What foods are not recommended? The items listed may not be a complete list. Talk with your dietitian about what dietary choices are best for you. Grains Baked goods made with fat, such as croissants, muffins, or some breads. Dry pasta or rice meal packs. Vegetables Creamed or fried vegetables. Vegetables in a cheese sauce. Regular canned vegetables (not low-sodium or reduced-sodium). Regular canned tomato sauce and paste (not low-sodium or reduced-sodium). Regular tomato and vegetable juice (not low-sodium or reduced-sodium). Angie Fava. Olives. Fruits  Canned fruit in a light or heavy syrup. Fried fruit. Fruit in cream or butter sauce. Meat and other protein foods Fatty cuts of meat. Ribs. Fried meat. Berniece Salines. Sausage. Bologna and other processed lunch meats. Salami. Fatback. Hotdogs. Bratwurst. Salted nuts and seeds. Canned beans with added salt. Canned or smoked fish. Whole eggs or egg yolks. Chicken or [place] with skin. Dairy Whole or 2% milk, cream, and half-and-half. Whole or full-fat cream cheese. Whole-fat or sweetened yogurt. Full-fat cheese. Nondairy creamers. Whipped toppings. Processed cheese and cheese spreads. Fats and oils Butter. Stick margarine. Lard. Shortening. Ghee. Bacon fat. Tropical oils, such as coconut, palm kernel, or palm  oil. Seasoning and other foods Salted popcorn and pretzels. Onion salt, garlic salt, seasoned salt, table salt, and sea salt. Worcestershire sauce. Tartar sauce. Barbecue sauce. Teriyaki sauce. Soy sauce, including reduced-sodium. Steak sauce. Canned and packaged gravies. Fish sauce. Oyster sauce. Cocktail sauce. Horseradish that you find on the shelf. Ketchup. Mustard. Meat flavorings and tenderizers. Bouillon cubes. Hot sauce and Tabasco sauce. Premade or packaged marinades. Premade or packaged taco seasonings. Relishes. Regular salad dressings. Where to find more information:  National Heart, Lung, and [place]: https://wilson-eaton.com/  American Heart Association: www.heart.org Summary  The DASH eating plan is a healthy eating plan that has been shown to reduce high blood pressure (hypertension). It may also reduce your risk for type 2 diabetes, heart disease, and stroke.  With the DASH eating plan, you should limit salt (sodium) intake to 2,300 mg a day. If you have hypertension, you may need to reduce your sodium intake to 1,500 mg a day.  When on the DASH eating plan, aim to eat more fresh fruits and vegetables, whole grains, lean proteins, low-fat dairy, and heart-healthy fats.  Work with your health care provider or diet and nutrition specialist (dietitian) to adjust your eating plan to your individual calorie needs. This information is not intended to replace advice given to you by your health care provider. Make sure you discuss any questions you have with your health care provider. Document Revised: 02/12/2017 Document Reviewed: 02/24/2016 Elsevier Patient Education  2020 [employer].

## 2019-08-09 ENCOUNTER — Telehealth: Payer: Self-pay

## 2019-08-09 NOTE — Telephone Encounter (Signed)
Patient called inquiring about scheduling injection with Dr. Nelva Bush.   Patient advised she called chronic care management team at [facility], [place].  Proceeded to give Dr. Nelva Bush number, but call was disconnected.

## 2019-08-16 ENCOUNTER — Ambulatory Visit: Payer: HMO | Admitting: Physician Assistant

## 2019-08-16 ENCOUNTER — Encounter: Payer: Self-pay | Admitting: Physician Assistant

## 2019-08-16 ENCOUNTER — Other Ambulatory Visit: Payer: Self-pay

## 2019-08-16 DIAGNOSIS — L810 Postinflammatory hyperpigmentation: Secondary | ICD-10-CM

## 2019-08-16 NOTE — Progress Notes (Signed)
   New Patient Visit  Subjective  Miranda [name] is a 74 y.o. female who presents for the following: Rash (On arms, legs, face x months. Some itch in the beginning, but then no other symptoms. The rash just moves around/ comes and goes. No treatment tried. patient just uses regular otc lotion. No changes in anything. ). She noticed a rash on her arms about a year ago. It just looked like dark spots on her arms. In the beginning the rash on her arms itched but then it stopped itching and hasn't itched for a long time. A new spot has not come in about a month. The last one to come was near her left eye. It  itches and she is scratching it during the visit. . She really has not used any treatments. Her PCP did tell her that one of her medications could make her sensitive to the sun and so she is to wear a hat and protect from the sun. There were never bumps or blisters or scaling skin. Just dark discoloration.   Objective  Well appearing patient in no apparent distress; mood and affect are within normal limits.  A focused examination was performed including face, neck, chest, arms, legs and back. Relevant physical exam findings are noted in the Assessment and Plan.  Objective  Left Anterior Neck, Left Forearm - Posterior, Left Temple, Right Forearm - Posterior, Right Lateral Canthus: Hyperpigmentation noted tops of both forearms, cheeks and temples on face both left greater than right she also has a patch on the left neck. No active inflammation noted.  Assessment & Plan  Postinflammatory hyperpigmentation (5) Left Forearm - Posterior; Right Forearm - Posterior; Right Lateral Canthus; Left Temple; Left Anterior Neck  Possible photosensitivity leaving PIH. Will discuss meds with PCP. Her hydrochlorothiazide is top culprit on my list. For now she is to sun protect. Recommended Cotz sunscreen

## 2019-08-29 DIAGNOSIS — M5136 Other intervertebral disc degeneration, lumbar region: Secondary | ICD-10-CM | POA: Diagnosis not present

## 2019-09-12 ENCOUNTER — Other Ambulatory Visit: Payer: Self-pay

## 2019-09-12 NOTE — Patient Outreach (Signed)
[facility]) Care Management Chronic Special Needs Program  09/12/2019  Name: Miranda [name] DOB: 1946/02/13  MRN: [id number]  Miranda [name] is enrolled in a chronic special needs plan for Heart Failure. Chronic Care Management Coordinator telephoned client to follow up and address any care management needs..   Subjective: client with a history of heart failure, diabetes, heart disease lung disease and hypertension. Client also reports she is being treated for degenerative disc disease and states it is controlled at this time. Client reports she continues to follow up with providers as scheduled. She states she has upcoming appointments in July with primary care provider, cardiologist. She states her main concern currently is constipation. Client reports she has medications and is taking but will stop taking when she has a bowel movement. Client also reports that she does not have docusate sodium.  Goals Addressed              This Visit's Progress   .  COMPLETED:  Acknowledge receipt of Advanced Directive package        Voiced receipt of advance directives and has completed the forms.    .  COMPLETED: Client understands the importance of follow-up with providers by attending scheduled visits        Client voiced understanding: She reports that attends provider visits as scheduled and calls provider if she is having any problems.    .  Client verbalize knowledge of Heart Failure disease self management skills within the next 6 months.   On track     Signs and symptoms of heart failure worsening reviewed.  Review [employer] calendar sent in the mail for Heart Failure information. Continue to weigh self daily and write down weights. Follow a low salt meal plan, read food labels for the salt content. Your provider may restrict or limit how much liquids you drink every day. Increase exercise only if you are able to do it. Follow doctor recommendations.       .  COMPLETED: Client will report keeping up with daily weights within the next 6 months.        You have stated that you are now weighing yourself daily, Miranda [name] job! Reiterated the importance of daily weights and how daily weights can help you managing your condition.    .  Client will report no worsening of symptoms of Atrial Fibrillation within the next 6 months   On track     Review [employer] calendar sent in the mail for Atrial Fibrillation information and action plan. Call your provider if you have a racing or irregular heartbeat that may be uncomfortable, shortness of breath with or without chest pain, weakness and dizziness. Follow up with your Cardiologist (heart doctor) for yearly visits or as recommended. Emmi education provided on "heart failure and atrial fibrillation". Please review and plan to discuss with RN at next telephonic assessment.    .  Client will report no worsening of symptoms related to heart disease within the next 6 months    On track     Follow a low salt meal plan, limit or avoid drinks with alcohol. Increase exercise only if you are able to do it. Follow doctor recommendations. Emmi education provided, "lowering your risk of heart disease". Review and plan to discuss with RN at next telephonic assessment.    .  Client will verbalize knowledge of chronic lung disease as evidenced by no ED visits or Inpatient stays related to chronic lung disease  On track     Review [employer] calendar sent in the mail for COPD information and action plan. Know when and how to use rescue inhaler and daily inhalers, along with other medication. Discussed Covid Vaccination.     .  Client will verbalize knowledge of self management of Hypertension as evidences by BP reading of 140/90 or less; or as defined by provider   On track     Discussed importance of managing blood pressure. Plan to check your blood pressure regularly. If you do not have a blood  pressure monitor, See your over the counter catalog benefit. If you have questions contact your [employer]. Continue to eat low salt and heart healthy meals with fruits, vegetables, whole grains, lean protein and limit fat and sugars. Emmi education provided, "Dash" diet. Review and plan to discuss with RN at next telephonic assessment.    .  COMPLETED: Maintain timely refills of Heart Failure medication as prescribed within the year         Discussed client's ability to obtain medications. Client denies any difficulty obtaining medications. Client encouraged to contact provider and/or RNCM if she has any trouble with getting her medications.    .  Obtain annual  Lipid Profile, LDL-C   On track     Latest Lipid profile not available.  Try to avoid saturated fats, trans-fats and eat more fiber. Emmi education, "Lipid tests". Review and plan to discuss at next telephonic outreach.    .  Patient Stated: maintain bowel regimen to minimize constipation (pt-stated)   On track     Discussed the importance of maintaining a bowel regimen. Continue to take medications recommended by your provider. Please go to your pharmacy to pick up your docusate sodium (Colace) and take as directed by your provider. Work towards having at least one bowel movement every day. Add lots of foods with fiber to your meal planning. Discussed the importance of water in having bowel movements. Contact your provider or the pharmacist in your doctors office that has been working with you on this, if you continue to have problems or with questions or concerns. RNCM has called to update  Emmi education provided, "High fiber diet", "dealing with constipation from the drugs you take", "constipation in adults".    .  Visit Primary Care Provider or Cardiologist at least 2 times per year   On track     Discussed importance of attending provider visits. Primary care provider seen 06/23/2019 Cardiology  visit 05/12/2019 Upcoming provider visits discussed. Continue to attend provider visits as scheduled.      Plan: RNCM notes that provider office pharmacist is working with client on management of constipation. RNCM will discuss/update provider office pharmacist. Send updated care plan to client, send updated care plan to primary care provider. Client will be outreached by a CM per tier level within the next 6 months.   Thea Silversmith, RN, MSN, [place] [phone]

## 2019-09-15 DIAGNOSIS — M5030 Other cervical disc degeneration, unspecified cervical region: Secondary | ICD-10-CM | POA: Diagnosis not present

## 2019-09-15 DIAGNOSIS — M5412 Radiculopathy, cervical region: Secondary | ICD-10-CM | POA: Diagnosis not present

## 2019-09-15 DIAGNOSIS — M5136 Other intervertebral disc degeneration, lumbar region: Secondary | ICD-10-CM | POA: Diagnosis not present

## 2019-09-19 ENCOUNTER — Ambulatory Visit: Payer: Self-pay

## 2019-09-22 ENCOUNTER — Other Ambulatory Visit: Payer: Self-pay | Admitting: Internal Medicine

## 2019-09-22 DIAGNOSIS — G8929 Other chronic pain: Secondary | ICD-10-CM

## 2019-09-28 ENCOUNTER — Other Ambulatory Visit: Payer: Self-pay | Admitting: Internal Medicine

## 2019-10-04 ENCOUNTER — Ambulatory Visit: Payer: HMO | Admitting: Physician Assistant

## 2019-10-04 ENCOUNTER — Encounter: Payer: Self-pay | Admitting: Physician Assistant

## 2019-10-04 ENCOUNTER — Other Ambulatory Visit: Payer: Self-pay

## 2019-10-04 DIAGNOSIS — L810 Postinflammatory hyperpigmentation: Secondary | ICD-10-CM

## 2019-10-04 NOTE — Progress Notes (Signed)
   Follow up Visit  Subjective  Miranda [name] is a 74 y.o. female who presents for the following: Skin Problem (postinflammatory hyperpigmentation, has been using lotion, it has helped with the itching but patient has not noticed a difference in the color). She feels like she is less itchy with the COTZ sunscreen but if she  Is out in the sun she still feels it burning. Overall she itches less.   Objective  Well appearing patient in no apparent distress; mood and affect are within normal limits.  Face examined. Relevant physical exam findings are noted in the Assessment and Plan.   Objective  Left Forearm - Posterior, Left Temple, Left Zygomatic Area, Right Forearm - Posterior: Hyperpigmented areas both forearms and left side of face.   Assessment & Plan  Postinflammatory hyperpigmentation (4) Left Forearm - Posterior; Right Forearm - Posterior; Left Temple; Left Zygomatic Area  Continue COTZ sunscreen and we need to try to get her PCP to think about her photosensitizing medications like her HCTZ and the medication she is on for her lung disease.

## 2019-10-10 ENCOUNTER — Other Ambulatory Visit: Payer: Self-pay

## 2019-10-12 ENCOUNTER — Other Ambulatory Visit: Payer: Self-pay

## 2019-10-12 ENCOUNTER — Other Ambulatory Visit (INDEPENDENT_AMBULATORY_CARE_PROVIDER_SITE_OTHER): Payer: HMO

## 2019-10-12 ENCOUNTER — Encounter: Payer: Self-pay | Admitting: Pulmonary Disease

## 2019-10-12 ENCOUNTER — Ambulatory Visit: Payer: HMO | Admitting: Pulmonary Disease

## 2019-10-12 VITALS — BP 124/60 | HR 61 | Temp 98.4°F | Ht 65.5 in | Wt 175.8 lb

## 2019-10-12 DIAGNOSIS — J84112 Idiopathic pulmonary fibrosis: Secondary | ICD-10-CM | POA: Diagnosis not present

## 2019-10-12 DIAGNOSIS — J849 Interstitial pulmonary disease, unspecified: Secondary | ICD-10-CM

## 2019-10-12 DIAGNOSIS — G4733 Obstructive sleep apnea (adult) (pediatric): Secondary | ICD-10-CM | POA: Diagnosis not present

## 2019-10-12 LAB — COMPREHENSIVE METABOLIC PANEL
ALT: 20 U/L (ref 0–35)
AST: 21 U/L (ref 0–37)
Albumin: 4.4 g/dL (ref 3.5–5.2)
Alkaline Phosphatase: 41 U/L (ref 39–117)
BUN: 10 mg/dL (ref 6–23)
CO2: 32 mEq/L (ref 19–32)
Calcium: 9.7 mg/dL (ref 8.4–10.5)
Chloride: 101 mEq/L (ref 96–112)
Creatinine, Ser: 0.89 mg/dL (ref 0.40–1.20)
GFR: 75.22 mL/min (ref >60.00)
Glucose, Bld: 96 mg/dL (ref 70–99)
Potassium: 3.4 mEq/L — ABNORMAL LOW (ref 3.5–5.1)
Sodium: 138 mEq/L (ref 135–145)
Total Bilirubin: 0.6 mg/dL (ref 0.2–1.2)
Total Protein: 7.8 g/dL (ref 6.0–8.3)

## 2019-10-12 LAB — BRAIN NATRIURETIC PEPTIDE: Pro B Natriuretic peptide (BNP): 51 pg/mL (ref 0.0–100.0)

## 2019-10-12 NOTE — Addendum Note (Signed)
Addended by: Elton Sin on: 10/12/2019 10:10 AM   Modules accepted: Orders

## 2019-10-12 NOTE — Progress Notes (Addendum)
MEL TADROS    [id number]    Oct 12, 1945  Primary Care Physician:Hernandez Everardo Beals, MD  Referring Physician: [name], [name], MD [address] [place],  [place] [address]  Chief complaint: Follow-up for interstitial lung disease, OSA  HPI: Mrs. Leis is here for follow-up of obstructive sleep apnea, pulmonary fibrosis She has history of UIP fibrosis.  There is history of rheumatoid arthritis in chart but connective tissue serologies are negative with no arthritis, joint pain symptoms.  Pulmonary fibrosis is felt to be more consistent with IPF and she has been on Esbriet since 2019  Symptoms of dyspnea on exertion is stable.  No new complaints today.  Pets: Has a dog.  No cats, birds, farm animals Occupation: Retired Quarry manager Exposures: Has feather pillows at home but not in her bedroom.  No mold, hot tub, Jacuzzi Smoking history: Never smoker Travel history: No significant travel history Relevant family history: No significant family history of lung disease  Interim history: Continues on Esbriet without issue Chief complaint today is constipation She is also developed a rash and is being evaluated by  Outpatient Encounter Medications as of 10/12/2019  Medication Sig  . ACETAMINOPHEN PO Take by mouth as needed (pain).  Marland Kitchen albuterol (VENTOLIN HFA) 108 (90 Base) MCG/ACT inhaler Inhale 1 puff into the lungs every 6 (six) hours as needed.  . ALPRAZolam (XANAX) 0.25 MG tablet Take 1 tablet (0.25 mg total) by mouth 3 (three) times daily as needed for anxiety.  Marland Kitchen amLODipine (NORVASC) 10 MG tablet Take 1 tablet (10 mg total) by mouth daily.  Marland Kitchen atorvastatin (LIPITOR) 80 MG tablet Take 1 tablet by mouth once daily  . Continuous Blood Gluc Receiver (FREESTYLE LIBRE 14 DAY READER) DEVI 1 each by Does not apply route daily.  . Continuous Blood Gluc Sensor (FREESTYLE LIBRE 14 DAY SENSOR) MISC USE  ONCE DAILY  . cyclobenzaprine (FLEXERIL) 5 MG tablet TAKE 1  TABLET BY MOUTH AT BEDTIME  . diclofenac sodium (VOLTAREN) 1 % GEL Apply 2 g topically 4 (four) times daily.  Marland Kitchen docusate sodium (COLACE) 100 MG capsule Take 1 capsule (100 mg total) by mouth 2 (two) times daily.  Marland Kitchen dronedarone (MULTAQ) 400 MG tablet TAKE 1 TABLET BY MOUTH TWICE DAILY WITH A MEAL Filled by Dr [name]  . furosemide (LASIX) 40 MG tablet Take 0.5 tablets (20 mg total) by mouth daily.  . hydrochlorothiazide (HYDRODIURIL) 25 MG tablet Take 1 tablet (25 mg total) by mouth daily.  Marland Kitchen HYDROcodone-acetaminophen (NORCO) 10-325 MG tablet Take 1 tablet by mouth every 6 (six) hours as needed.  . lactulose (CONSTULOSE) 10 GM/15ML solution TAKE 30 MILLILITERS BY MOUTH  THREE TIMES DAILY AS NEEDED FOR MILD CONSTIPATION  . losartan (COZAAR) 100 MG tablet Take 1 tablet by mouth once daily  . metFORMIN (GLUCOPHAGE XR) 500 MG 24 hr tablet Take 500 mg by mouth daily with breakfast.  . Pirfenidone 801 MG TABS Take 801 mg by mouth in the morning, at noon, and at bedtime.  . polyethylene glycol powder (GLYCOLAX/MIRALAX) powder Take 17 g by mouth daily.  . rivaroxaban (XARELTO) 20 MG TABS tablet TAKE 1 TABLET BY MOUTH ONCE DAILY WITH SUPPER   No facility-administered encounter medications on file as of 10/12/2019.   Physical Exam: Blood pressure (!) 124/60, pulse 61, temperature 98.4 F (36.9 C), temperature source Oral, height 5' 5.5" (1.664 m), weight 175 lb 12.8 oz (79.7 kg), SpO2 97 %. Gen:  No acute distress HEENT:  EOMI, sclera anicteric Neck:     No masses; no thyromegaly Lungs:    Clear to auscultation bilaterally; normal respiratory effort CV:         Regular rate and rhythm; no murmurs Abd:      + bowel sounds; soft, non-tender; no palpable masses, no distension Ext:    No edema; adequate peripheral perfusion Skin:      Warm and dry; no rash Neuro: alert and oriented x 3 Psych: normal mood and affect  Data Reviewed: Imaging: High-res CT 12/09/2018- mild pulmonary fibrosis and  basilar predominance.  Traction bronchiectasis, honeycombing.  Unchanged compared to 2017 I have reviewed all images personally  PFTs: 06/14/2017 FVC 2.21 [89%], FEV1 1.88 [98%],/55, TLC 3.68 [70%], DLCO 14.69 [57%] Mild restriction with moderate diffusion impairment.  Stable compared to 2017  Labs: CTD serologies including CCP, rheumatoid factor 2017, 2018, 2019-negative Comprehensive metabolic panel 4/78/2956- normal.  Sleep study: 10/2016 PSG> AHI 10.5   CPAP download 12/19/2018-77% compliance.  Residual AHI 2  Cardiac: Echocardiogram 10/19/2017-LVEF 60 to 21%, grade 1 diastolic dysfunction, mild RV dilatation.  Assessment:  Pulmonary fibrosis Although she has history of rheumatoid arthritis in the chart she does not have any symptoms.  And has negative serologies.  Presentation is consistent with IPF Her lung function and CT scan have remained stable on Esbriet.  She has developed a rash but I doubt this is from [place] as she has been on this therapy since 2019  Reevaluate ILD with high-res CT, PFTs and 6-minute walk test Check hepatic panel for monitoring  Evaluation for pulmonary hypertension Does not have lower extremity edema.  Last echocardiogram notes mild RV dilatation though no mention of increased pressures. Check N-terminal proBNP.  If elevated then repeat echo  Obstructive sleep apnea Download reviewed with poor compliance Patient tells me that she sometimes forgets to use it. Encouraged her to CPAP every day without fail.  Health maintenance Has been vaccinated against Covid  Plan/Recommendations: - Continue Esbriet, CPAP - High-res CT, PFTs, 6-minute walk test - Hepatic panel, proBNP  [name] MD [facility] 10/12/2019, 9:50 AM  CC: [name], [name]

## 2019-10-12 NOTE — Patient Instructions (Signed)
We will get labs today including comprehensive metabolic panel, proBNP Schedule high-resolution CT, PFTs and 6-minute walk test and 3 months Follow-up in clinic after this test.

## 2019-10-16 ENCOUNTER — Telehealth: Payer: Self-pay | Admitting: Internal Medicine

## 2019-10-16 NOTE — Telephone Encounter (Addendum)
Lars Pinks- Bruning PA, with [place] would like to discuss with you about this patient her medication. Anderson Malta believe its the HCTZ and another medication is the cause of her condition and would like to speak with you about it. Please call [phone]

## 2019-10-17 ENCOUNTER — Ambulatory Visit (INDEPENDENT_AMBULATORY_CARE_PROVIDER_SITE_OTHER): Payer: HMO | Admitting: Pulmonary Disease

## 2019-10-17 ENCOUNTER — Other Ambulatory Visit: Payer: Self-pay

## 2019-10-17 ENCOUNTER — Telehealth: Payer: Self-pay | Admitting: Pulmonary Disease

## 2019-10-17 DIAGNOSIS — J849 Interstitial pulmonary disease, unspecified: Secondary | ICD-10-CM

## 2019-10-17 LAB — PULMONARY FUNCTION TEST
DL/VA % pred: 109 %
DL/VA: 4.44 ml/min/mmHg/L
DLCO cor % pred: 80 %
DLCO cor: 16.51 ml/min/mmHg
DLCO unc % pred: 80 %
DLCO unc: 16.51 ml/min/mmHg
FEF 25-75 Post: 1.06 L/sec
FEF 25-75 Pre: 1.69 L/sec
FEF2575-%Change-Post: -37 %
FEF2575-%Pred-Post: 61 %
FEF2575-%Pred-Pre: 98 %
FEV1-%Change-Post: -17 %
FEV1-%Pred-Post: 73 %
FEV1-%Pred-Pre: 88 %
FEV1-Post: 1.42 L
FEV1-Pre: 1.72 L
FEV1FVC-%Change-Post: -17 %
FEV1FVC-%Pred-Pre: 106 %
FEV6-%Change-Post: 0 %
FEV6-%Pred-Post: 87 %
FEV6-%Pred-Pre: 87 %
FEV6-Post: 2.1 L
FEV6-Pre: 2.11 L
FEV6FVC-%Pred-Post: 103 %
FEV6FVC-%Pred-Pre: 103 %
FVC-%Change-Post: 0 %
FVC-%Pred-Post: 83 %
FVC-%Pred-Pre: 84 %
FVC-Post: 2.1 L
FVC-Pre: 2.11 L
Post FEV1/FVC ratio: 68 %
Post FEV6/FVC ratio: 100 %
Pre FEV1/FVC ratio: 82 %
Pre FEV6/FVC Ratio: 100 %
RV % pred: 61 %
RV: 1.45 L
TLC % pred: 70 %
TLC: 3.77 L

## 2019-10-17 NOTE — Telephone Encounter (Signed)
Pt returning a phone call. Pt can be reached at [phone].

## 2019-10-17 NOTE — Telephone Encounter (Signed)
Please let her know that labs are stable except for slightly low potassium It would help if she can eat a banana every day to help with the low potassium

## 2019-10-17 NOTE — Telephone Encounter (Signed)
Results were reviewed with pt. Pt states understanding. Nothing further needed at this time.

## 2019-10-17 NOTE — Telephone Encounter (Signed)
ATC unable to reach, unable to leave message. Will call back later.

## 2019-10-17 NOTE — Progress Notes (Signed)
Full PFT performed today. °

## 2019-10-17 NOTE — Telephone Encounter (Signed)
Called and left a message for patient. Informed her that we would send a message to Dr. Vaughan Browner and call her as soon as we have the results of her lab work.

## 2019-10-23 ENCOUNTER — Ambulatory Visit (INDEPENDENT_AMBULATORY_CARE_PROVIDER_SITE_OTHER): Payer: HMO | Admitting: Adult Health

## 2019-10-23 ENCOUNTER — Encounter: Payer: Self-pay | Admitting: Adult Health

## 2019-10-23 ENCOUNTER — Other Ambulatory Visit: Payer: Self-pay

## 2019-10-23 ENCOUNTER — Ambulatory Visit (INDEPENDENT_AMBULATORY_CARE_PROVIDER_SITE_OTHER): Payer: HMO

## 2019-10-23 DIAGNOSIS — R0602 Shortness of breath: Secondary | ICD-10-CM

## 2019-10-23 DIAGNOSIS — J849 Interstitial pulmonary disease, unspecified: Secondary | ICD-10-CM

## 2019-10-23 DIAGNOSIS — J84112 Idiopathic pulmonary fibrosis: Secondary | ICD-10-CM | POA: Diagnosis not present

## 2019-10-23 DIAGNOSIS — G4733 Obstructive sleep apnea (adult) (pediatric): Secondary | ICD-10-CM | POA: Diagnosis not present

## 2019-10-23 DIAGNOSIS — J9610 Chronic respiratory failure, unspecified whether with hypoxia or hypercapnia: Secondary | ICD-10-CM | POA: Diagnosis not present

## 2019-10-23 NOTE — Assessment & Plan Note (Signed)
Clinically appears stable.  Lung function slowed just a slight decline from November 2020.  Overall clinical baseline is stable.  No desaturations with ambulation.  Patient is continue on current regimen with Esbriet Has had resolution CT chest planned for October 2021.  Plan  Patient Instructions  Continue on current regimen.  Activity as tolerated. CT chest as planned in October.  Follow up with Dr. Vaughan Browner in 2 months and As needed

## 2019-10-23 NOTE — Progress Notes (Signed)
Six Minute Walk - 10/23/19 1149      Six Minute Walk   Supplemental oxygen during test? No    Lap distance in meters  34 meters    Laps Completed 10    Partial lap (in meters) 0 meters    Baseline BP (sitting) 124/60    Baseline Heartrate 60    Baseline Dyspnea (Borg Scale) 2    Baseline Fatigue (Borg Scale) 3    Baseline SPO2 100 %      End of Test Values    BP (sitting) 142/60    Heartrate 72    Dyspnea (Borg Scale) 3    Fatigue (Borg Scale) 3    SPO2 100 %      2 Minutes Post Walk Values   BP (sitting) 120/60    Heartrate 58    SPO2 100 %    Stopped or paused before six minutes? No    Other Symptoms at end of exercise: Dizziness      Interpretation   Distance completed 340 meters

## 2019-10-23 NOTE — Patient Instructions (Signed)
Continue on current regimen.  Activity as tolerated. CT chest as planned in October.  Follow up with Dr. Vaughan Browner in 2 months and As needed

## 2019-10-23 NOTE — Progress Notes (Signed)
@Patient  ID: Miranda [name], female    DOB: [birth year], 74 y.o.   MRN: [id number]  Chief Complaint  Patient presents with  . Follow-up    ILD     Referring provider: [name], [place]*  HPI: 74 year old female followed for pulmonary fibrosis-consistent with IPF with UIP pattern on CT chest. Has been on Esbriet since 2019  TEST/EVENTS :  High-res CT 12/09/2018- mild pulmonary fibrosis and basilar predominance.  Traction bronchiectasis, honeycombing.  Unchanged compared to 2017 I have reviewed all images personally  PFTs: 06/14/2017 FVC 2.21 [89%], FEV1 1.88 [98%],/55, TLC 3.68 [70%], DLCO 14.69 [57%] Mild restriction with moderate diffusion impairment.  Stable compared to 2017  February 03, 2019 FEV1 94%, ratio 85, FVC 86%, no significant bronchodilator response, DLCO 84%.  Labs: CTD serologies including CCP, rheumatoid factor 2017, 2018, 2019-negative Comprehensive metabolic panel 5/57/3220- normal.  Sleep study: 10/2016 PSG>AHI 10.5   CPAP download 12/19/2018-77% compliance.  Residual AHI 2  Cardiac: Echocardiogram 10/19/2017-LVEF 60 to 25%, grade 1 diastolic dysfunction, mild RV dilatation.  10/23/2019 Follow up : IPF  Patient presents for a follow-up for IPF.  Patient was recently seen in the office on October 12, 2019.  Says overall breathing is doing okay.  She tries to remain active.  Patient had pulmonary function testing here from done on October 17, 2019 that showed just a slight increase in her lung function since November 2020.  FEV1 88%, ratio 82, DLCO 80%.   Remains on Esbriet, says that she is doing well with no nausea vomiting diarrhea.  Is more prone to constipation.  Lab work last visit showed normal LFTs.. Patient denies any flare of cough or wheezing.. 6-minute walk test today showed no desaturations with O2 saturations 100% on room air.,  340 m completed.   Allergies  Allergen Reactions  . Fish Oil Anaphylaxis  . Other Hives, Shortness Of Breath and  Swelling    Allergic to cashew nuts and peanut oil.  Marland Kitchen Penicillins Anaphylaxis, Hives and Swelling    Has patient had a PCN reaction causing immediate rash, facial/tongue/throat swelling, SOB or lightheadedness with hypotension: Face swelling and hives started first, then swelling of the throat  Has patient had a PCN reaction causing severe rash involving mucus membranes or skin necrosis: Yes  Has patient had a PCN reaction that required hospitalization: No  Has patient had a PCN reaction occurring within the last 10 years: Yes  If all of the above answers are "NO", then may proceed with Cephalospor  . Pneumococcal Vaccines Nausea And Vomiting  . Cashew Nut Oil   . Nitrofurantoin Macrocrystal Hives  . Peanut Oil   . Aspirin Itching and Rash  . Bactrim [Sulfamethoxazole-Trimethoprim] Hives, Itching and Rash  . Ciprofloxacin Hives, Itching and Rash  . Ibuprofen Rash  . Influenza Vaccines Hives  . Ivp Dye [Iodinated Diagnostic Agents] Hives, Itching and Rash    Gives benadryl to counteract symptoms  . Latex Rash  . Macrobid [Nitrofurantoin Monohydrate Macrocrystals] Hives  . Shellfish Allergy Hives    Patient also allergic to seafood    Immunization History  Administered Date(s) Administered  . PFIZER SARS-COV-2 Vaccination 04/21/2019, 05/12/2019  . PPD Test 11/19/2010    Past Medical History:  Diagnosis Date  . Anxiety   . Arthritis   . Asthma   . Atrial fibrillation ([place])   . CHF (congestive heart failure) ([place])    pt. unsure- but thinks she was hosp. for CHF- 2002  . [facility]  [facility]  . Clotting disorder ([place])    blood clots in lungs/PE pulmonary embolism  . Colon polyps   . Complication of anesthesia    states requires a lot med. to put her to sleep   . DDD (degenerative disc disease) 09/17/2011  . Depression    "sometimes "  . DM (diabetes mellitus) ([place])   . Family history of anesthesia complication   . GERD (gastroesophageal reflux  disease)   . Glaucoma    bilateral, pt. admits that she is noncompliant to eye gtts.   . Hemorrhoids   . Hyperlipidemia   . Hypertension    had stress, echo- 2006 /w [place], Cardiac Cath, per pt. 2002, echo repeated 2012- wnl   . Low back pain   . Shortness of breath   . Sleep apnea    uses c-pap- q night recently    Tobacco History: Social History   Tobacco Use  Smoking Status Never Smoker  Smokeless Tobacco Never Used   Counseling given: Not Answered   Outpatient Medications Prior to Visit  Medication Sig Dispense Refill  . ACETAMINOPHEN PO Take by mouth as needed (pain).    Marland Kitchen albuterol (VENTOLIN HFA) 108 (90 Base) MCG/ACT inhaler Inhale 1 puff into the lungs every 6 (six) hours as needed. 18 g 1  . ALPRAZolam (XANAX) 0.25 MG tablet Take 1 tablet (0.25 mg total) by mouth 3 (three) times daily as needed for anxiety. 90 tablet 0  . amLODipine (NORVASC) 10 MG tablet Take 1 tablet (10 mg total) by mouth daily. 90 tablet 1  . atorvastatin (LIPITOR) 80 MG tablet Take 1 tablet by mouth once daily 90 tablet 1  . Continuous Blood Gluc Receiver (FREESTYLE LIBRE 14 DAY READER) DEVI 1 each by Does not apply route daily. 1 each 2  . Continuous Blood Gluc Sensor (FREESTYLE LIBRE 14 DAY SENSOR) MISC USE  ONCE DAILY 3 each 2  . cyclobenzaprine (FLEXERIL) 5 MG tablet TAKE 1 TABLET BY MOUTH AT BEDTIME 45 tablet 0  . diclofenac sodium (VOLTAREN) 1 % GEL Apply 2 g topically 4 (four) times daily. 1 Tube 1  . docusate sodium (COLACE) 100 MG capsule Take 1 capsule (100 mg total) by mouth 2 (two) times daily. 10 capsule 0  . dronedarone (MULTAQ) 400 MG tablet TAKE 1 TABLET BY MOUTH TWICE DAILY WITH A MEAL Filled by Dr [name] 180 tablet 3  . furosemide (LASIX) 40 MG tablet Take 0.5 tablets (20 mg total) by mouth daily. 90 tablet 1  . hydrochlorothiazide (HYDRODIURIL) 25 MG tablet Take 1 tablet (25 mg total) by mouth daily. 90 tablet 1  . HYDROcodone-acetaminophen (NORCO) 10-325 MG tablet Take  1 tablet by mouth every 6 (six) hours as needed. 90 tablet 0  . lactulose (CONSTULOSE) 10 GM/15ML solution TAKE 30 MILLILITERS BY MOUTH  THREE TIMES DAILY AS NEEDED FOR MILD CONSTIPATION 473 mL 0  . losartan (COZAAR) 100 MG tablet Take 1 tablet by mouth once daily 90 tablet 0  . metFORMIN (GLUCOPHAGE XR) 500 MG 24 hr tablet Take 500 mg by mouth daily with breakfast.    . Pirfenidone 801 MG TABS Take 801 mg by mouth in the morning, at noon, and at bedtime.    . polyethylene glycol powder (GLYCOLAX/MIRALAX) powder Take 17 g by mouth daily. 3350 g 1  . rivaroxaban (XARELTO) 20 MG TABS tablet TAKE 1 TABLET BY MOUTH ONCE DAILY WITH SUPPER 90 tablet 1   No facility-administered medications prior to visit.  Review of Systems:   Constitutional:   No  weight loss, night sweats,  Fevers, chills,  +fatigue, or  lassitude.  HEENT:   No headaches,  Difficulty swallowing,  Tooth/dental problems, or  Sore throat,                No sneezing, itching, ear ache, nasal congestion, post nasal drip,   CV:  No chest pain,  Orthopnea, PND, swelling in lower extremities, anasarca, dizziness, palpitations, syncope.   GI  No heartburn, indigestion, abdominal pain, nausea, vomiting, diarrhea, change in bowel habits, loss of appetite, bloody stools.   Resp:  .  No chest wall deformity  Skin: no rash or lesions.  GU: no dysuria, change in color of urine, no urgency or frequency.  No flank pain, no hematuria   MS:  No joint pain or swelling.  No decreased range of motion.  No back pain.    Physical Exam  BP 122/78 (BP Location: Left Arm, Cuff Size: Normal)   Pulse 89   Temp (!) 97.1 F (36.2 C) (Oral)   Ht 5\' 5"  (1.651 m)   Wt 177 lb 12.8 oz (80.6 kg)   SpO2 96%   BMI 29.59 kg/m   GEN: A/Ox3; pleasant , NAD,    HEENT:  [place]/AT,    NOSE-clear, THROAT-clear, no lesions, no postnasal drip or exudate noted.   NECK:  Supple w/ fair ROM; no JVD; normal carotid impulses w/o bruits; no thyromegaly or  nodules palpated; no lymphadenopathy.    RESP  Clear  P & A; w/o, wheezes/ rales/ or rhonchi. no accessory muscle use, no dullness to percussion  CARD:  RRR, no m/r/g, no peripheral edema, pulses intact, no cyanosis or clubbing.  GI:   Soft & nt; nml bowel sounds; no organomegaly or masses detected.   Musco: Warm bil, no deformities or joint swelling noted.   Neuro: alert, no focal deficits noted.    Skin: Warm, no lesions or rashes    Lab Results:  CBC    Component Value Date/Time   WBC 6.8 11/02/2017 2304   RBC 4.79 11/02/2017 2304   HGB 14.6 11/02/2017 2329   HCT 43.0 11/02/2017 2329   PLT 185 11/02/2017 2304   MCV 88.9 11/02/2017 2304   MCH 27.6 11/02/2017 2304   MCHC 31.0 11/02/2017 2304   RDW 14.1 11/02/2017 2304   LYMPHSABS 2.7 11/02/2017 2304   MONOABS 0.6 11/02/2017 2304   EOSABS 0.2 11/02/2017 2304   BASOSABS 0.0 11/02/2017 2304    BMET    Component Value Date/Time   NA 138 10/12/2019 1041   K 3.4 (L) 10/12/2019 1041   CL 101 10/12/2019 1041   CO2 32 10/12/2019 1041   GLUCOSE 96 10/12/2019 1041   BUN 10 10/12/2019 1041   CREATININE 0.89 10/12/2019 1041   CALCIUM 9.7 10/12/2019 1041   GFRNONAA >60 11/04/2017 0410   GFRAA >60 11/04/2017 0410    BNP    Component Value Date/Time   BNP 29.7 09/14/2017 1556    ProBNP    Component Value Date/Time   PROBNP 51.0 10/12/2019 1041    Imaging: No results found.    PFT Results Latest Ref Rng & Units 10/17/2019 01/17/2019 06/07/2017 12/07/2016 05/27/2016 06/12/2015  FVC-Pre L 2.11 2.08 2.16 1.95 2.35 1.81  FVC-Predicted Pre % 84 86 87 78 94 71  FVC-Post L 2.10 2.12 2.21 2.05 2.25 1.96  FVC-Predicted Post % 83 87 89 82 90 77  Pre FEV1/FVC % % 82  85 84 86 83 85  Post FEV1/FCV % % 68 87 85 88 86 84  FEV1-Pre L 1.72 1.78 1.81 1.67 1.96 1.55  FEV1-Predicted Pre % 88 94 94 86 100 78  FEV1-Post L 1.42 1.83 1.88 1.81 1.93 1.65  DLCO uncorrected ml/min/mmHg 16.51 16.93 14.69 15.38 14.26 14.32  DLCO UNC% % 80  84 57 60 55 55  DLCO corrected ml/min/mmHg 16.51 - - 14.86 - 14.95  DLCO COR %Predicted % 80 - - 58 - 58  DLVA Predicted % 109 110 85 83 87 95  TLC L 3.77 3.88 3.68 3.87 3.54 3.85  TLC % Predicted % 70 74 70 74 67 74  RV % Predicted % 61 75 67 81 56 86    No results found for: NITRICOXIDE      Assessment & Plan:   IPF (idiopathic pulmonary fibrosis) (HCC) Clinically appears stable.  Lung function slowed just a slight decline from November 2020.  Overall clinical baseline is stable.  No desaturations with ambulation.  Patient is continue on current regimen with Esbriet Has had resolution CT chest planned for October 2021.  Plan  Patient Instructions  Continue on current regimen.  Activity as tolerated. CT chest as planned in October.  Follow up with Dr. Vaughan Browner in 2 months and As needed          Rexene Edison, NP 10/23/2019

## 2019-10-26 ENCOUNTER — Other Ambulatory Visit: Payer: HMO

## 2019-10-27 ENCOUNTER — Other Ambulatory Visit: Payer: Self-pay | Admitting: Family

## 2019-10-27 ENCOUNTER — Other Ambulatory Visit: Payer: Self-pay | Admitting: Internal Medicine

## 2019-10-27 DIAGNOSIS — F419 Anxiety disorder, unspecified: Secondary | ICD-10-CM

## 2019-10-27 DIAGNOSIS — G8929 Other chronic pain: Secondary | ICD-10-CM

## 2019-11-10 ENCOUNTER — Telehealth: Payer: HMO

## 2019-11-16 ENCOUNTER — Other Ambulatory Visit: Payer: Self-pay | Admitting: Family

## 2019-11-22 ENCOUNTER — Other Ambulatory Visit: Payer: Self-pay | Admitting: Internal Medicine

## 2019-11-22 DIAGNOSIS — F419 Anxiety disorder, unspecified: Secondary | ICD-10-CM

## 2019-11-22 MED ORDER — ALPRAZOLAM 0.25 MG PO TABS: 0.2500 mg | ORAL_TABLET | Freq: Three times a day (TID) | ORAL | 0 refills | Status: DC | PRN

## 2019-11-22 MED ORDER — HYDROCHLOROTHIAZIDE 25 MG PO TABS: 25.0000 mg | ORAL_TABLET | Freq: Every day | ORAL | 1 refills | Status: DC

## 2019-11-22 NOTE — Telephone Encounter (Signed)
Refills sent

## 2019-11-22 NOTE — Telephone Encounter (Signed)
Pt called to say she needs her medication   hydrochlorothiazide (HYDRODIURIL) 25 MG tablet    ALPRAZolam (XANAX) 0.25 MG tablet   Filled and sent to the pharmacy at   [facility] [place] [facility] [place]  [address] [place] ([place]) [place] [address]  Phone:  [phone] Fax:  [phone]   Please advise

## 2019-11-24 ENCOUNTER — Ambulatory Visit: Payer: HMO | Admitting: Pharmacist

## 2019-11-24 DIAGNOSIS — E785 Hyperlipidemia, unspecified: Secondary | ICD-10-CM

## 2019-11-24 DIAGNOSIS — I10 Essential (primary) hypertension: Secondary | ICD-10-CM

## 2019-11-24 NOTE — Chronic Care Management (AMB) (Signed)
Chronic Care Management Pharmacy  Name: Miranda [name]  MRN: [id number] DOB: 12/08/1945  Initial Questions: 1. Have you seen any other providers since your last visit? Yes - dermatology & pulmonology 2. Any changes in your medicines or health? No   Chief Complaint/ HPI Miranda [name],  74 y.o. , female presents for their Initial CCM visit with the clinical pharmacist via telephone due to COVID-19 Pandemic.  PCP : [name], [name], MD  Their chronic conditions include: Afib, DM, HTN, HLD, IPF/ asthma, fluid retention, low back pain, anxiety, constipation   Office Visits: 06/23/2019- [name], MD- patient presented for virtual visit for medication refills, rash of black patches over arms and face, and instructions on Xarelto. Due to being a telephone visit, patient was referred to dermatology. Patient instructed to hold Xarelto 5 days prior to procedure and to resume as soon as possible. Medications were refilled.   03/28/2019- [name], MD- patient presented for virtual visit for medication refill and instruction on Xarelto hold due to upcoming dental surgery. Patient instructed can go off Xarelto 5 days prior to procedure and resume after surgery. Hydrocodone/ APAP 10/325mg  refilled.   Consult Visit: 10/23/19 - Pulmonology - Rexene Edison, NP - Patient presented for follow up for OSA and pulmonary fibrosis. Noted 77% compliance with CPAP.  10/12/19 - Pulmonology - [name], MD - Patient presented for follow up for OSA and pulmonary fibrosis. MD does not believe Esbriet is the cause of her rash and continued it. Follow up in 3 months. Pulmonary function testing showed slight increase in lung function since Nov 2020.  10/04/2019 - Dermatology - Arlyss Gandy, PA-C - Patient presented for follow up for rash evaluation. Patient reported COTZ sunscreen has been helping but still notes burning when outside. Will discuss with PCP  photosensitizing medications (HCTZ) and reassess.   09/15/19 - Physical Therapy - [name] - Patient presented for physical therapy for cervical radiculopathy. Unable to review notes.  08/16/2019 - Dermatology - Arlyss Gandy, PA-C - Patient presented for initial visit for rash evaluation. Patient reported rash started about a year ago and has dark discoloration.   07/29/2019- Cardiology- Allegra Lai, MD- patient presented for office visit for afib evaluation (electrophysiology evaluation). Patient remains in sinus rhythm. Episodes occur every few weeks. BP mildly elevated. Coronary artery disease- patient has calcified LAD and circumflex on CT scan- patient denied chest pain. No changes made. Patient to follow up in 6 months.   05/12/19- Cardiology- Margreta Journey, PA- patient presented for virtual visit for follow up. Patient to continue current BP medications. If SBP keeps going up to 150s, plan to add another BP medication. No medication changes. Patient to follow up with Dr. Curt Bears in March and follow up with Dr. [name] in 1 year.   Medications: Outpatient Encounter Medications as of 11/24/2019  Medication Sig Note  . ACETAMINOPHEN PO Take by mouth as needed (pain).   Marland Kitchen albuterol (VENTOLIN HFA) 108 (90 Base) MCG/ACT inhaler Inhale 1 puff into the lungs every 6 (six) hours as needed.   . ALPRAZolam (XANAX) 0.25 MG tablet Take 1 tablet (0.25 mg total) by mouth 3 (three) times daily as needed for anxiety.   Marland Kitchen amLODipine (NORVASC) 10 MG tablet Take 1 tablet (10 mg total) by mouth daily.   Marland Kitchen atorvastatin (LIPITOR) 80 MG tablet Take 1 tablet by mouth once daily   . Continuous Blood Gluc Receiver (FREESTYLE LIBRE 14 DAY READER) DEVI 1 each by Does not  apply route daily.   . Continuous Blood Gluc Sensor (FREESTYLE LIBRE 14 DAY SENSOR) MISC USE  ONCE DAILY   . cyclobenzaprine (FLEXERIL) 5 MG tablet TAKE 1 TABLET BY MOUTH AT BEDTIME   . diclofenac sodium (VOLTAREN) 1 % GEL Apply 2 g topically 4  (four) times daily.   Marland Kitchen docusate sodium (COLACE) 100 MG capsule Take 1 capsule (100 mg total) by mouth 2 (two) times daily. 03/28/2019: Takes as needed.  . dronedarone (MULTAQ) 400 MG tablet TAKE 1 TABLET BY MOUTH TWICE DAILY WITH A MEAL Filled by Dr [name]   . furosemide (LASIX) 40 MG tablet Take 0.5 tablets (20 mg total) by mouth daily.   . hydrochlorothiazide (HYDRODIURIL) 25 MG tablet Take 1 tablet (25 mg total) by mouth daily.   Marland Kitchen HYDROcodone-acetaminophen (NORCO) 10-325 MG tablet Take 1 tablet by mouth every 6 (six) hours as needed.   . lactulose (CONSTULOSE) 10 GM/15ML solution TAKE 30 MILLILITERS BY MOUTH  THREE TIMES DAILY AS NEEDED FOR MILD CONSTIPATION   . losartan (COZAAR) 100 MG tablet Take 1 tablet by mouth once daily   . metFORMIN (GLUCOPHAGE XR) 500 MG 24 hr tablet Take 500 mg by mouth daily with breakfast.   . Pirfenidone 801 MG TABS Take 801 mg by mouth in the morning, at noon, and at bedtime.   . polyethylene glycol powder (GLYCOLAX/MIRALAX) powder Take 17 g by mouth daily. 03/28/2019: Takes as needed.  . rivaroxaban (XARELTO) 20 MG TABS tablet TAKE 1 TABLET BY MOUTH ONCE DAILY WITH SUPPER    No facility-administered encounter medications on file as of 11/24/2019.    Current Diagnosis/Assessment:  Goals Addressed            This Visit's Progress   . Pharmacy Care Plan       CARE PLAN ENTRY  Current Barriers:  . Chronic Disease Management support, education, and care coordination needs related to Hypertension, Hyperlipidemia, Diabetes, Atrial Fibrillation, Anxiety, and Constipation   Hypertension . Pharmacist Clinical Goal(s): o Over the next 90 days, patient will work with PharmD and providers to maintain BP goal <140/90 . Current regimen:   Amlodipine 10mg , 1 tablet once daily  Hydrochlorothiazide 25mg , 1 tablet once daily  Losartan 100mg , 1 tablet once daily . Interventions: o We discussed: DASH diet:  following a diet emphasizing fruits and  vegetables and low-fat dairy products along with whole grains, fish, poultry, and nuts. Reducing red meats and sugars.  . Patient self care activities - Over the next 90 days, patient will: o Check blood pressure daily, document, and provide at future appointments o Ensure daily salt intake < 2300 mg/day o Make sure to check blood pressure after taking morning medications  Hyperlipidemia . Pharmacist Clinical Goal(s): o Over the next 90 days, patient will work with PharmD and providers to achieve LDL goal < 70 . Current regimen:  o Atorvastatin 80mg , 1 tablet once daily  . Interventions: . We discussed how a diet high in plant sterols (fruits/vegetables/nuts/whole grains/legumes) may reduce your cholesterol.  Encouraged increasing fiber to a daily intake of 10-25g/day  . We discussed the importance of taking atorvastatin to lower your risks of heart events and strokes . Patient self care activities - Over the next 90 days, patient will: o Make sure to take atorvastatin 80 mg every day  Diabetes . Pharmacist Clinical Goal(s): o Over the next 90 days, patient will work with PharmD and providers to maintain A1c goal <7% . Current regimen:  o Metformin  ER 500mg , 1 tablet once daily with breakfast  Patient self care activities - Over the next 90 days, patient will: o Check blood sugar with Freestyle Libre, document, and provide at future appointments o Contact provider with any episodes of hypoglycemia  Atrial fibrillation . Pharmacist Clinical Goal(s) o Over the next 90 days, patient will work with PharmD and providers to maintain sinus rhythm . Current regimen:  o dronedarone (Multaq) 400mg , 1 tablet twice daily with a meal  o rivaroxaban (Xarelto) 20mg , 1 tablet once daily with supper (blood clot prevention)  . Interventions: o We discussed:  monitoring for signs and symptoms for bleeding (coughing up blood, prolonged nose bleeds, black, tarry stools). . Patient self care activities -  Over the next 90 days, patient will: o Continue current medications.   Anxiety . Pharmacist Clinical Goal(s) o Over the next 90 days, patient will work with PharmD and providers to improve in anxiety symptoms . Current regimen:   Alprazolam 0.25mg ,  1 tablet three times daily as needed for anxiety . Interventions: o Discussed medications that are long duration for anxiety.  . Patient self care activities o Patient will continue current medications and follow up if needed.  Constipation . Pharmacist Clinical Goal(s) o Over the next 90 days, patient will work with PharmD and providers to improve constipation.  . Current regimen:   docusate 100mg , 1 capsule twice daily  Lactulose solution, take 30 ML three times daily as needed for mild constipation . Interventions: o We discussed drinking plenty of water and increasing fiber intake to help with constipation . Patient self care activities o Patient will continue current regimen and report back as needed.    Medication management . Pharmacist Clinical Goal(s): o Over the next 90 days, patient will work with PharmD and providers to achieve optimal medication adherence . [facility] (for Pirfenidone 801mg ) . Interventions o Comprehensive medication review performed. o Utilize [name] for medication synchronization, packaging and delivery . Patient self care activities - Over the next 90 days, patient will: o Focus on medication adherence by verifying supply of atorvastatin and include in daily pill box if not done so already.  o Take medications as prescribed o Report any questions or concerns to PharmD and/or provider(s)  Please see past updates related to this goal by clicking on the "Past Updates" button in the selected goal           AFIB  Patient reported being on Xarelto due to history of PE.    Patient is currently rhythm controlled. HR: 60-65-70 BPM  Patient has failed  these meds in past: none   Patient is currently controlled on the following medications:   dronedarone (Multaq) 400mg , 1 tablet twice daily with a meal   Anticoagulation  rivaroxaban (Xarelto) 20mg , 1 tablet once daily with supper    We discussed:  monitoring for signs and symptoms for bleeding (coughing up blood, prolonged nose bleeds, black, tarry stools).  Plan Continue current medications  Diabetes   Recent Relevant Labs: Lab Results  Component Value Date/Time   HGBA1C 5.9 (A) 10/19/2018 09:51 AM   HGBA1C 6.0 02/02/2018 12:20 PM   HGBA1C 5.9 (A) 09/22/2017 10:13 AM   HGBA1C 6.5 (H) 08/30/2010 07:10 AM    Kidney Function Lab Results  Component Value Date/Time   CREATININE 0.89 10/12/2019 10:41 AM   CREATININE 0.98 11/09/2018 10:20 AM   GFR 75.22 10/12/2019 10:41 AM   GFRNONAA >60 11/04/2017 04:10 AM  GFRAA >60 11/04/2017 04:10 AM   Checking BG: Daily  Patient has CGM.  Patient has failed these meds in past: none   Patient is currently controlled on the following medications:   Metformin ER 500mg , 1 tablet once daily with breakfast  (patient reported will take additional dose if BGs are "high" Unable to define high. She reports taking a 2nd dose about once every month)   Last diabetic Eye exam: No results found for: HMDIABEYEEXA  - patient reports last time was last year and states goes every 6 months due to glaucoma   Last diabetic Foot exam: No results found for: HMDIABFOOTEX  - denies persistent neuropathy  We discussed: diet and exercise extensively and how to recognize and treat signs of hypoglycemia  - she reports having some low blood sugars 3-4 times a month -eating protein  -low blood sugars at   Plan Continue current medications. Overdue for A1c check. Consider stopping metformin if A1c is still low.   Hypertension   Office blood pressures are: BP Readings from Last 3 Encounters:  10/23/19 122/78  10/12/19 (!) 124/60  05/29/19 134/60    Patient has failed these meds in the past: benazepril, metoprolol   Patient checks BP at home daily  Patient home BP readings are ranging: 190/80 (before medicines), 166/70; patient reports better numbers other days 132/70s  Patient is controlled on:   Amlodipine 10mg , 1 tablet once daily  Hydrochlorothiazide 25mg , 1 tablet once daily  Losartan 100mg , 1 tablet once daily  We discussed diet and exercise extensively  - diet: patient reports working on it and has cut out salt and pork; patient tries to eat a lot more fish and vegetables - exercise- patient reports she walks most days; recommended goal of 150 min/week    Plan Continue current medications and control with diet and exercise. Continue checking BP at home daily and keep a record of it. Will reach out to Dr. Jerilee Hoh about HCTZ possibly causing rash and alternative BP medications.  Hyperlipidemia   Lipid Panel     Component Value Date/Time   CHOL 167 11/26/2017 0908   TRIG 75 11/26/2017 0908   HDL 61 11/26/2017 0908   CHOLHDL 2.7 11/26/2017 0908   CHOLHDL 4 06/01/2014 1145   VLDL 16.8 06/01/2014 1145   LDLCALC 91 11/26/2017 0908   LDLDIRECT 152.8 03/30/2011 0859   LABVLDL 15 11/26/2017 0908   The 10-year ASCVD risk score (Goff DC Jr., et al., 2013) is: 25%   Values used to calculate the score:     Age: 4 years     Sex: Female     Is Non-Hispanic African American: Yes     Diabetic: Yes     Tobacco smoker: No     Systolic Blood Pressure: 299 mmHg     Is BP treated: Yes     HDL Cholesterol: 61 mg/dL     Total Cholesterol: 167 mg/dL   Patient has failed these meds in past: lovastatin   Patient is currently uncontrolled on the following medications:  Atorvastatin 80mg , 1 tablet once daily - not taking  We discussed:   - LDL goal <70  - adherence: last fill date: 02/2019 for 90 day supply (patient could not confirm if she was taking)  - diet and exercise  - needs updated lipid panel    Plan Recommend lipid panel at next physical scheduled for 9/15.  Continue current medications  IPF/ asthma  Patient reports stable breathing.   Eosinophil count:  Lab Results  Component Value Date/Time   EOSPCT 3 11/02/2017 11:04 PM  %                               Eos (Absolute):  Lab Results  Component Value Date/Time   EOSABS 0.2 11/02/2017 11:04 PM    Tobacco Status:  Social History   Tobacco Use  Smoking Status Never Smoker  Smokeless Tobacco Never Used   Patient has failed these meds in past: none   Patient is currently controlled on the following medications:   Albuterol HFA 174mcg, act inhaler, inhale 1 puff every six hours as needed   Pirfenidone 801mg , 1 tablet in the morning, at noon, and at bedtime   Using maintenance inhaler regularly? NA- not prescribed   Frequency of rescue inhaler use:  prn- patient reports use of albuterol inhaler less than every week.   Plan Continue current medications   Fluid retention   Patient reports seeing fluid retention in ankles and evident when she has shortness of breath. Currently, she states it is controlled.   Patient is currently controlled on the following medications:  Furosemide 40mg  0.5 tables once daily (for fluid retention).  Plan Continue current medications   Low back pain  Patient stated she is scheduled to get injections for pain; pending insurance approval.  Patient not at home and unable to confirm APAP strength.   Patient has failed these meds in past: meloxicam, naproxen, oxycodone  Patient is currently controlled on the following medications:   Hydrocodone/APAP 10/325mg , 1 tablet every six hours as needed (takes as indicated)  Cyclobenzaprine 5mg ,1 tablet at bedtime   Diclofenac 1% gel, apply 2 g four times daily   APAP 500 mg, 1 tablet as needed for pain   We discussed:  - caution use of APAP from multiple medications (maximum dose of APAP: 3g/day).   Plan Continue current  medications  Anxiety   Patient reports taking everyday; at most takes twice daily.   Denies drowsiness. Reports helps her calm down.    Patient has failed these meds in past: none   Patient is currently controlled on the following medications:   Alprazolam 0.25mg ,  1 tablet three times daily as needed for anxiety  We discussed:  caution use of benzodiazepines in older adults  - discussed other options that are longer duration for anxiety   Plan Continue current medications  Readdress at follow up.   Constipation  Patient has failed these meds in past: bisacodyl, Linzess (previous loose stools)   Patient is currently controlled on the following medications:   docusate 100mg , 1 capsule twice daily  Lactulose solution, take 30 ML three times daily as needed for mild constipation  Miralax, 17g one daily (not a part of daily regimen)   We discussed:  - tier exception for Linzess (patient deferred to continue current regimen)   Plan Continue current medications  Recommend Senna.    Medication Management   Pt uses [facility] for all medications Uses pill box? Yes Pt endorses not missing many doses.  We discussed: Taking atorvastatin as prescribed - patient has not filled since 02/2019 and at first was unsure if she was still taking and could not locate bottle; pt found an old prescription and may have been taking expired medicine and the incorrect strength if she was taking; discussed switching to [name] to ensure compliance  Plan   Utilize [name] for medication synchronization,  packaging and delivery  Verbal consent obtained for [name] enhanced pharmacy services (medication synchronization, adherence packaging, delivery coordination). A medication sync plan was created to allow patient to get all medications delivered once every 30 to 90 days per patient preference. Patient understands they have freedom to choose pharmacy and clinical  pharmacist will coordinate care between all prescribers and [name].   Follow up: 3 month phone visit with CPP Follow up in 1 month for BP assessment with CPA  [name], PharmD Clinical Pharmacist [place] [facility] [place] [phone]

## 2019-11-28 ENCOUNTER — Other Ambulatory Visit: Payer: Self-pay

## 2019-11-28 NOTE — Patient Instructions (Signed)
Visit Information  Goals Addressed            This Visit's Progress   . Pharmacy Care Plan       CARE PLAN ENTRY  Current Barriers:  . Chronic Disease Management support, education, and care coordination needs related to Hypertension, Hyperlipidemia, Diabetes, Atrial Fibrillation, Anxiety, and Constipation   Hypertension . Pharmacist Clinical Goal(s): o Over the next 90 days, patient will work with PharmD and providers to maintain BP goal <140/90 . Current regimen:   Amlodipine 10mg , 1 tablet once daily  Hydrochlorothiazide 25mg , 1 tablet once daily  Losartan 100mg , 1 tablet once daily . Interventions: o We discussed: DASH diet:  following a diet emphasizing fruits and vegetables and low-fat dairy products along with whole grains, fish, poultry, and nuts. Reducing red meats and sugars.  . Patient self care activities - Over the next 90 days, patient will: o Check blood pressure daily, document, and provide at future appointments o Ensure daily salt intake < 2300 mg/day o Make sure to check blood pressure after taking morning medications  Hyperlipidemia . Pharmacist Clinical Goal(s): o Over the next 90 days, patient will work with PharmD and providers to achieve LDL goal < 70 . Current regimen:  o Atorvastatin 80mg , 1 tablet once daily  . Interventions: . We discussed how a diet high in plant sterols (fruits/vegetables/nuts/whole grains/legumes) may reduce your cholesterol.  Encouraged increasing fiber to a daily intake of 10-25g/day  . We discussed the importance of taking atorvastatin to lower your risks of heart events and strokes . Patient self care activities - Over the next 90 days, patient will: o Make sure to take atorvastatin 80 mg every day  Diabetes . Pharmacist Clinical Goal(s): o Over the next 90 days, patient will work with PharmD and providers to maintain A1c goal <7% . Current regimen:  o Metformin ER 500mg , 1 tablet once daily with breakfast  Patient  self care activities - Over the next 90 days, patient will: o Check blood sugar with Freestyle Libre, document, and provide at future appointments o Contact provider with any episodes of hypoglycemia  Atrial fibrillation . Pharmacist Clinical Goal(s) o Over the next 90 days, patient will work with PharmD and providers to maintain sinus rhythm . Current regimen:  o dronedarone (Multaq) 400mg , 1 tablet twice daily with a meal  o rivaroxaban (Xarelto) 20mg , 1 tablet once daily with supper (blood clot prevention)  . Interventions: o We discussed:  monitoring for signs and symptoms for bleeding (coughing up blood, prolonged nose bleeds, black, tarry stools). . Patient self care activities - Over the next 90 days, patient will: o Continue current medications.   Anxiety . Pharmacist Clinical Goal(s) o Over the next 90 days, patient will work with PharmD and providers to improve in anxiety symptoms . Current regimen:   Alprazolam 0.25mg ,  1 tablet three times daily as needed for anxiety . Interventions: o Discussed medications that are long duration for anxiety.  . Patient self care activities o Patient will continue current medications and follow up if needed.  Constipation . Pharmacist Clinical Goal(s) o Over the next 90 days, patient will work with PharmD and providers to improve constipation.  . Current regimen:   docusate 100mg , 1 capsule twice daily  Lactulose solution, take 30 ML three times daily as needed for mild constipation . Interventions: o We discussed drinking plenty of water and increasing fiber intake to help with constipation . Patient self care activities o Patient will continue  current regimen and report back as needed.    Medication management . Pharmacist Clinical Goal(s): o Over the next 90 days, patient will work with PharmD and providers to achieve optimal medication adherence . [facility] (for Pirfenidone  801mg ) . Interventions o Comprehensive medication review performed. o Utilize [name] for medication synchronization, packaging and delivery . Patient self care activities - Over the next 90 days, patient will: o Focus on medication adherence by verifying supply of atorvastatin and include in daily pill box if not done so already.  o Take medications as prescribed o Report any questions or concerns to PharmD and/or provider(s)  Please see past updates related to this goal by clicking on the "Past Updates" button in the selected goal         The patient verbalized understanding of instructions provided today and declined a print copy of patient instruction materials.   Telephone follow up appointment with pharmacy team member scheduled for: 3 months  [name], PharmD Clinical Pharmacist [facility] [place] [phone]

## 2019-11-29 ENCOUNTER — Encounter: Payer: Self-pay | Admitting: Internal Medicine

## 2019-11-29 ENCOUNTER — Ambulatory Visit (INDEPENDENT_AMBULATORY_CARE_PROVIDER_SITE_OTHER): Payer: HMO | Admitting: Internal Medicine

## 2019-11-29 VITALS — BP 100/64 | HR 75 | Temp 98.5°F | Ht 64.5 in | Wt 176.7 lb

## 2019-11-29 DIAGNOSIS — Z13820 Encounter for screening for osteoporosis: Secondary | ICD-10-CM

## 2019-11-29 DIAGNOSIS — E119 Type 2 diabetes mellitus without complications: Secondary | ICD-10-CM | POA: Diagnosis not present

## 2019-11-29 DIAGNOSIS — Z780 Asymptomatic menopausal state: Secondary | ICD-10-CM | POA: Diagnosis not present

## 2019-11-29 DIAGNOSIS — J84112 Idiopathic pulmonary fibrosis: Secondary | ICD-10-CM | POA: Diagnosis not present

## 2019-11-29 DIAGNOSIS — Z23 Encounter for immunization: Secondary | ICD-10-CM | POA: Diagnosis not present

## 2019-11-29 DIAGNOSIS — I480 Paroxysmal atrial fibrillation: Secondary | ICD-10-CM

## 2019-11-29 DIAGNOSIS — Z0000 Encounter for general adult medical examination without abnormal findings: Secondary | ICD-10-CM

## 2019-11-29 NOTE — Progress Notes (Signed)
Established Patient Office Visit     This visit occurred during the SARS-CoV-2 public health emergency.  Safety protocols were in place, including screening questions prior to the visit, additional usage of staff PPE, and extensive cleaning of exam room while observing appropriate contact time as indicated for disinfecting solutions.    CC/Reason for Visit: Annual preventive exam and subsequent Medicare wellness visit  HPI: Miranda [name] is a 74 y.o. female who is coming in today for the above mentioned reasons. Past Medical History is significant for: Atrial fibrillation chronically anticoagulated onXareltoon rhythm therapy with Multaqand rate controlled on metoprolol,benign essential hypertensionthat has not been well controlled recently, prior history of PE, interstitial lung disease, GERD, type 2 diabetes, hyperlipidemia.  I have not seen her in the office since August 2020.  She has not seen her eye doctor this year, she has routine dental care.  She is due for flu, pneumonia, tetanus, shingles vaccinations.  Her mammogram is overdue, she has an appointment with GYN coming up later this month, she declines colon cancer screening.  She has no acute complaints today.  Past Medical/Surgical History: Past Medical History:  Diagnosis Date  . Anxiety   . Arthritis   . Asthma   . Atrial fibrillation ([place])   . CHF (congestive heart failure) ([place])    pt. unsure- but thinks she was hosp. for CHF- 2002  . [facility]    [facility]  . Clotting disorder ([place])    blood clots in lungs/PE pulmonary embolism  . Colon polyps   . Complication of anesthesia    states requires a lot med. to put her to sleep   . DDD (degenerative disc disease) 09/17/2011  . Depression    "sometimes "  . DM (diabetes mellitus) ([place])   . Family history of anesthesia complication   . GERD (gastroesophageal reflux disease)   . Glaucoma    bilateral, pt. admits that she is  noncompliant to eye gtts.   . Hemorrhoids   . Hyperlipidemia   . Hypertension    had stress, echo- 2006 /w [facility], Cardiac Cath, per pt. 2002, echo repeated 2012- wnl   . Low back pain   . Shortness of breath   . Sleep apnea    uses c-pap- q night recently    Past Surgical History:  Procedure Laterality Date  . ABDOMINAL HYSTERECTOMY     ectopic, fibroids  . arm surgery Right   . CARDIAC CATHETERIZATION    . CATARACT EXTRACTION, BILATERAL     cataracts removed bilateral- ?IOL  . COLONOSCOPY     remote  . FISSURECTOMY  10/08/2011   Procedure: FISSURECTOMY;  Surgeon: Stark Klein, MD;  Location: [facility];  Service: General;  Laterality: N/A;  . FLEXIBLE SIGMOIDOSCOPY  02/25/2011   Procedure: FLEXIBLE SIGMOIDOSCOPY;  Surgeon: Inda Castle, MD;  Location: [facility];  Service: Endoscopy;  Laterality: N/A;  . FOOT SURGERY     bilat, heel spurs- screw in R foot   . HEMORRHOID SURGERY  10/08/2011   Procedure: HEMORRHOIDECTOMY;  Surgeon: Stark Klein, MD;  Location: [place];  Service: General;  Laterality: N/A;  External   . SPHINCTEROTOMY  10/08/2011   Procedure: Joan Mayans;  Surgeon: Stark Klein, MD;  Location: [place];  Service: General;  Laterality: N/A;    Social History:  reports that she has never smoked. She has never used smokeless tobacco. She reports that she does not drink alcohol and does not use  drugs.  Allergies: Allergies  Allergen Reactions  . Fish Oil Anaphylaxis  . Other Hives, Shortness Of Breath and Swelling    Allergic to cashew nuts and peanut oil.  Marland Kitchen Penicillins Anaphylaxis, Hives and Swelling    Has patient had a PCN reaction causing immediate rash, facial/tongue/throat swelling, SOB or lightheadedness with hypotension: Face swelling and hives started first, then swelling of the throat  Has patient had a PCN reaction causing severe rash involving mucus membranes or skin necrosis: Yes  Has patient had a PCN reaction that required hospitalization: No    Has patient had a PCN reaction occurring within the last 10 years: Yes  If all of the above answers are "NO", then may proceed with Cephalospor  . Pneumococcal Vaccines Nausea And Vomiting  . Cashew Nut Oil   . Nitrofurantoin Macrocrystal Hives  . Peanut Oil   . Aspirin Itching and Rash  . Bactrim [Sulfamethoxazole-Trimethoprim] Hives, Itching and Rash  . Ciprofloxacin Hives, Itching and Rash  . Ibuprofen Rash  . Influenza Vaccines Hives  . Ivp Dye [Iodinated Diagnostic Agents] Hives, Itching and Rash    Gives benadryl to counteract symptoms  . Latex Rash  . Macrobid [Nitrofurantoin Monohydrate Macrocrystals] Hives  . Shellfish Allergy Hives    Patient also allergic to seafood    Family History:  Family History  Problem Relation Age of Onset  . Heart attack Mother   . Heart disease Mother   . Breast cancer Mother   . Emphysema Sister   . Breast cancer Sister   . Arthritis/Rheumatoid Sister   . Asthma Sister   . Lung cancer Sister   . COPD Sister   . Colon cancer Brother   . Colon cancer Brother   . Anesthesia problems Neg Hx      Current Outpatient Medications:  .  ACETAMINOPHEN PO, Take by mouth as needed (pain)., Disp: , Rfl:  .  albuterol (VENTOLIN HFA) 108 (90 Base) MCG/ACT inhaler, Inhale 1 puff into the lungs every 6 (six) hours as needed., Disp: 18 g, Rfl: 1 .  ALPRAZolam (XANAX) 0.25 MG tablet, Take 1 tablet (0.25 mg total) by mouth 3 (three) times daily as needed for anxiety., Disp: 90 tablet, Rfl: 0 .  amLODipine (NORVASC) 10 MG tablet, Take 1 tablet (10 mg total) by mouth daily., Disp: 90 tablet, Rfl: 1 .  atorvastatin (LIPITOR) 80 MG tablet, Take 1 tablet by mouth once daily, Disp: 90 tablet, Rfl: 1 .  Continuous Blood Gluc Receiver (FREESTYLE LIBRE 14 DAY READER) DEVI, 1 each by Does not apply route daily., Disp: 1 each, Rfl: 2 .  Continuous Blood Gluc Sensor (FREESTYLE LIBRE 14 DAY SENSOR) MISC, USE  ONCE DAILY, Disp: 3 each, Rfl: 0 .  cyclobenzaprine  (FLEXERIL) 5 MG tablet, TAKE 1 TABLET BY MOUTH AT BEDTIME, Disp: 45 tablet, Rfl: 0 .  diclofenac sodium (VOLTAREN) 1 % GEL, Apply 2 g topically 4 (four) times daily., Disp: 1 Tube, Rfl: 1 .  docusate sodium (COLACE) 100 MG capsule, Take 1 capsule (100 mg total) by mouth 2 (two) times daily., Disp: 10 capsule, Rfl: 0 .  dronedarone (MULTAQ) 400 MG tablet, TAKE 1 TABLET BY MOUTH TWICE DAILY WITH A MEAL Filled by Dr [name], Disp: 180 tablet, Rfl: 3 .  furosemide (LASIX) 40 MG tablet, Take 0.5 tablets (20 mg total) by mouth daily., Disp: 90 tablet, Rfl: 1 .  hydrochlorothiazide (HYDRODIURIL) 25 MG tablet, Take 1 tablet (25 mg total) by mouth daily., Disp: 90  tablet, Rfl: 1 .  HYDROcodone-acetaminophen (NORCO) 10-325 MG tablet, Take 1 tablet by mouth every 6 (six) hours as needed., Disp: 90 tablet, Rfl: 0 .  lactulose (CONSTULOSE) 10 GM/15ML solution, TAKE 30 MILLILITERS BY MOUTH  THREE TIMES DAILY AS NEEDED FOR MILD CONSTIPATION, Disp: 473 mL, Rfl: 0 .  losartan (COZAAR) 100 MG tablet, Take 1 tablet by mouth once daily, Disp: 90 tablet, Rfl: 0 .  metFORMIN (GLUCOPHAGE XR) 500 MG 24 hr tablet, Take 500 mg by mouth daily with breakfast., Disp: , Rfl:  .  Pirfenidone 801 MG TABS, Take 801 mg by mouth in the morning, at noon, and at bedtime., Disp: , Rfl:  .  polyethylene glycol powder (GLYCOLAX/MIRALAX) powder, Take 17 g by mouth daily., Disp: 3350 g, Rfl: 1 .  rivaroxaban (XARELTO) 20 MG TABS tablet, TAKE 1 TABLET BY MOUTH ONCE DAILY WITH SUPPER, Disp: 90 tablet, Rfl: 1  Review of Systems:  Constitutional: Denies fever, chills, diaphoresis, appetite change and fatigue.  HEENT: Denies photophobia, eye pain, redness, hearing loss, ear pain, congestion, sore throat, rhinorrhea, sneezing, mouth sores, trouble swallowing, neck pain, neck stiffness and tinnitus.   Respiratory: Denies SOB, DOE, cough, chest tightness,  and wheezing.   Cardiovascular: Denies chest pain, palpitations and leg swelling.    Gastrointestinal: Denies nausea, vomiting, abdominal pain, diarrhea, constipation, blood in stool and abdominal distention.  Genitourinary: Denies dysuria, urgency, frequency, hematuria, flank pain and difficulty urinating.  Endocrine: Denies: hot or cold intolerance, sweats, changes in hair or nails, polyuria, polydipsia. Musculoskeletal: Denies myalgias, back pain, joint swelling, arthralgias and gait problem.  Skin: Denies pallor, rash and wound.  Neurological: Denies dizziness, seizures, syncope, weakness, light-headedness, numbness and headaches.  Hematological: Denies adenopathy. Easy bruising, personal or family bleeding history  Psychiatric/Behavioral: Denies suicidal ideation, mood changes, confusion, nervousness, sleep disturbance and agitation    Physical Exam: Vitals:   11/29/19 1449  BP: 100/64  Pulse: 75  Temp: 98.5 F (36.9 C)  TempSrc: Oral  SpO2: 97%  Weight: 176 lb 11.2 oz (80.2 kg)  Height: 5' 4.5" (1.638 m)    Body mass index is 29.86 kg/m.   Constitutional: NAD, calm, comfortable Eyes: PERRL, lids and conjunctivae normal ENMT: Mucous membranes are moist.Tympanic membrane is pearly white, no erythema or bulging. Neck: normal, supple, no masses, no thyromegaly Respiratory: clear to auscultation bilaterally, no wheezing, no crackles. Normal respiratory effort. No accessory muscle use.  Cardiovascular: Regular rate and rhythm, no murmurs / rubs / gallops. No extremity edema. 2+ pedal pulses. No carotid bruits.  Abdomen: no tenderness, no masses palpated. No hepatosplenomegaly. Bowel sounds positive.  Musculoskeletal: no clubbing / cyanosis. No joint deformity upper and lower extremities. Good ROM, no contractures. Normal muscle tone.  Skin: no rashes, lesions, ulcers. No induration Neurologic: CN 2-12 grossly intact. Sensation intact, DTR normal. Strength 5/5 in all 4.  Psychiatric: Normal judgment and insight. Alert and oriented x 3. Normal mood.     Subsequent Medicare wellness visit   1. Risk factors, based on past  M,S,F -cardiovascular disease risk factors include age, history of hyperlipidemia, history of diabetes, history of hypertension   2.  Physical activities: She is sedentary   3.  Depression/mood:  Stable, not depressed   4.  Hearing:  No perceived issues   5.  ADL's: Independent in all ADLs   6.  Fall risk:  Low fall risk   7.  Home safety: No problems identified   8.  Height weight, and visual acuity: Height  and weight as above, visual acuity is 20/32 with eyes independently and combined   9.  Counseling:  Advised increased physical activity   10. Lab orders based on risk factors: Laboratory update will be reviewed   11. Referral :  None today   12. Care plan:  Follow-up with me in 3 to 4 months   13. Cognitive assessment:  No cognitive impairment   14. Screening: Patient provided with a written and personalized 5-10 year screening schedule in the AVS.   yes   15. Provider List Update:   PCP, cardiology, pulmonary, ophthalmology  16. Advance Directives: Full code     Office Visit from 11/29/2019 in [facility]  PHQ-9 Total Score 1      Fall Risk  11/29/2019 01/02/2019 12/01/2017 06/10/2016 10/18/2015  Falls in the past year? 0 0 No No No  Number falls in past yr: 0 - - - -  Injury with Fall? 0 - - - -     Impression and Plan:  Encounter for preventive health examination  -Have advised routine eye and dental care. -She will receive flu and Pneumovax today, Tdap and shingles at pharmacy. -She will return for labs that she is not fasting today. -Healthy lifestyle has been discussed in detail. -She has upcoming appointment with GYN for cervical cancer screening. -Mammogram will be updated today. -She declines further colon cancer screening. -DEXA scan to be requested today.  IPF (idiopathic pulmonary fibrosis) ([facility]) -Followed by pulmonary with upcoming  appointment.  Paroxysmal atrial fibrillation ([facility]) -Currently rate controlled, followed by cardiology.  Diabetes mellitus without complication ([place])  -Check A1c, she has been well controlled in the past with an A1c of 5.9 in August 2020.   Patient Instructions  -Nice seeing you today!!  -Return fasting for your lab work.  -Flu and pneumonia vaccines today.  -Remember to get your tetanus and shingles vaccines at your pharmacy in a few weeks.  -We will request your mammogram today.  -Remember to schedule your follow up with your eye doctor.  -Schedule follow up in 3-4 months.   Preventive Care 1 Years and Older, Female Preventive care refers to lifestyle choices and visits with your health care provider that can promote health and wellness. This includes:  A yearly physical exam. This is also called an annual well check.  Regular dental and eye exams.  Immunizations.  Screening for certain conditions.  Healthy lifestyle choices, such as diet and exercise. What can I expect for my preventive care visit? Physical exam Your health care provider will check:  Height and weight. These may be used to calculate body mass index (BMI), which is a measurement that tells if you are at a healthy weight.  Heart rate and blood pressure.  Your skin for abnormal spots. Counseling Your health care provider may ask you questions about:  Alcohol, tobacco, and drug use.  Emotional well-being.  Home and relationship well-being.  Sexual activity.  Eating habits.  History of falls.  Memory and ability to understand (cognition).  Work and work Statistician.  Pregnancy and menstrual history. What immunizations do I need?  Influenza (flu) vaccine  This is recommended every year. Tetanus, diphtheria, and pertussis (Tdap) vaccine  You may need a Td booster every 10 years. Varicella (chickenpox) vaccine  You may need this vaccine if you have not already been  vaccinated. Zoster (shingles) vaccine  You may need this after age 74. Pneumococcal conjugate (PCV13) vaccine  One dose is recommended after  age 44. Pneumococcal polysaccharide (PPSV23) vaccine  One dose is recommended after age 53. Measles, mumps, and rubella (MMR) vaccine  You may need at least one dose of MMR if you were born in 1957 or later. You may also need a second dose. Meningococcal conjugate (MenACWY) vaccine  You may need this if you have certain conditions. Hepatitis A vaccine  You may need this if you have certain conditions or if you travel or work in places where you may be exposed to hepatitis A. Hepatitis B vaccine  You may need this if you have certain conditions or if you travel or work in places where you may be exposed to hepatitis B. Haemophilus influenzae type b (Hib) vaccine  You may need this if you have certain conditions. You may receive vaccines as individual doses or as more than one vaccine together in one shot (combination vaccines). Talk with your health care provider about the risks and benefits of combination vaccines. What tests do I need? Blood tests  Lipid and cholesterol levels. These may be checked every 5 years, or more frequently depending on your overall health.  Hepatitis C test.  Hepatitis B test. Screening  Lung cancer screening. You may have this screening every year starting at age 33 if you have a 30-pack-year history of smoking and currently smoke or have quit within the past 15 years.  Colorectal cancer screening. All adults should have this screening starting at age 71 and continuing until age 61. Your health care provider may recommend screening at age 38 if you are at increased risk. You will have tests every 1-10 years, depending on your results and the type of screening test.  Diabetes screening. This is done by checking your blood sugar (glucose) after you have not eaten for a while (fasting). You may have this done  every 1-3 years.  Mammogram. This may be done every 1-2 years. Talk with your health care provider about how often you should have regular mammograms.  BRCA-related cancer screening. This may be done if you have a family history of breast, ovarian, tubal, or peritoneal cancers. Other tests  Sexually transmitted disease (STD) testing.  Bone density scan. This is done to screen for osteoporosis. You may have this done starting at age 1. Follow these instructions at home: Eating and drinking  Eat a diet that includes fresh fruits and vegetables, whole grains, lean protein, and low-fat dairy products. Limit your intake of foods with high amounts of sugar, saturated fats, and salt.  Take vitamin and mineral supplements as recommended by your health care provider.  Do not drink alcohol if your health care provider tells you not to drink.  If you drink alcohol: ? Limit how much you have to 0-1 drink a day. ? Be aware of how much alcohol is in your drink. In the [place] one drink equals one 12 oz bottle of beer (355 mL), one 5 oz glass of wine (148 mL), or one 1 oz glass of hard liquor (44 mL). Lifestyle  Take daily care of your teeth and gums.  Stay active. Exercise for at least 30 minutes on 5 or more days each week.  Do not use any products that contain nicotine or tobacco, such as cigarettes, e-cigarettes, and chewing tobacco. If you need help quitting, ask your health care provider.  If you are sexually active, practice safe sex. Use a condom or other form of protection in order to prevent STIs (sexually transmitted infections).  Talk with your  health care provider about taking a low-dose aspirin or statin. What's next?  Go to your health care provider once a year for a well check visit.  Ask your health care provider how often you should have your eyes and teeth checked.  Stay up to date on all vaccines. This information is not intended to replace advice given to you by your  health care provider. Make sure you discuss any questions you have with your health care provider. Document Revised: 02/24/2018 Document Reviewed: 02/24/2018 Elsevier Patient Education  2020 [place] [facility]

## 2019-11-29 NOTE — Addendum Note (Signed)
Addended by: Westley Hummer B on: 11/29/2019 05:08 PM   Modules accepted: Orders

## 2019-11-29 NOTE — Patient Instructions (Signed)
-Nice seeing you today!!  -Return fasting for your lab work.  -Flu and pneumonia vaccines today.  -Remember to get your tetanus and shingles vaccines at your pharmacy in a few weeks.  -We will request your mammogram today.  -Remember to schedule your follow up with your eye doctor.  -Schedule follow up in 3-4 months.   Preventive Care 73 Years and Older, Female Preventive care refers to lifestyle choices and visits with your health care provider that can promote health and wellness. This includes:  A yearly physical exam. This is also called an annual well check.  Regular dental and eye exams.  Immunizations.  Screening for certain conditions.  Healthy lifestyle choices, such as diet and exercise. What can I expect for my preventive care visit? Physical exam Your health care provider will check:  Height and weight. These may be used to calculate body mass index (BMI), which is a measurement that tells if you are at a healthy weight.  Heart rate and blood pressure.  Your skin for abnormal spots. Counseling Your health care provider may ask you questions about:  Alcohol, tobacco, and drug use.  Emotional well-being.  Home and relationship well-being.  Sexual activity.  Eating habits.  History of falls.  Memory and ability to understand (cognition).  Work and work Statistician.  Pregnancy and menstrual history. What immunizations do I need?  Influenza (flu) vaccine  This is recommended every year. Tetanus, diphtheria, and pertussis (Tdap) vaccine  You may need a Td booster every 10 years. Varicella (chickenpox) vaccine  You may need this vaccine if you have not already been vaccinated. Zoster (shingles) vaccine  You may need this after age 63. Pneumococcal conjugate (PCV13) vaccine  One dose is recommended after age 30. Pneumococcal polysaccharide (PPSV23) vaccine  One dose is recommended after age 23. Measles, mumps, and rubella (MMR)  vaccine  You may need at least one dose of MMR if you were born in 1957 or later. You may also need a second dose. Meningococcal conjugate (MenACWY) vaccine  You may need this if you have certain conditions. Hepatitis A vaccine  You may need this if you have certain conditions or if you travel or work in places where you may be exposed to hepatitis A. Hepatitis B vaccine  You may need this if you have certain conditions or if you travel or work in places where you may be exposed to hepatitis B. Haemophilus influenzae type b (Hib) vaccine  You may need this if you have certain conditions. You may receive vaccines as individual doses or as more than one vaccine together in one shot (combination vaccines). Talk with your health care provider about the risks and benefits of combination vaccines. What tests do I need? Blood tests  Lipid and cholesterol levels. These may be checked every 5 years, or more frequently depending on your overall health.  Hepatitis C test.  Hepatitis B test. Screening  Lung cancer screening. You may have this screening every year starting at age 85 if you have a 30-pack-year history of smoking and currently smoke or have quit within the past 15 years.  Colorectal cancer screening. All adults should have this screening starting at age 32 and continuing until age 48. Your health care provider may recommend screening at age 63 if you are at increased risk. You will have tests every 1-10 years, depending on your results and the type of screening test.  Diabetes screening. This is done by checking your blood sugar (glucose) after  you have not eaten for a while (fasting). You may have this done every 1-3 years.  Mammogram. This may be done every 1-2 years. Talk with your health care provider about how often you should have regular mammograms.  BRCA-related cancer screening. This may be done if you have a family history of breast, ovarian, tubal, or peritoneal  cancers. Other tests  Sexually transmitted disease (STD) testing.  Bone density scan. This is done to screen for osteoporosis. You may have this done starting at age 59. Follow these instructions at home: Eating and drinking  Eat a diet that includes fresh fruits and vegetables, whole grains, lean protein, and low-fat dairy products. Limit your intake of foods with high amounts of sugar, saturated fats, and salt.  Take vitamin and mineral supplements as recommended by your health care provider.  Do not drink alcohol if your health care provider tells you not to drink.  If you drink alcohol: ? Limit how much you have to 0-1 drink a day. ? Be aware of how much alcohol is in your drink. In the [place] one drink equals one 12 oz bottle of beer (355 mL), one 5 oz glass of wine (148 mL), or one 1 oz glass of hard liquor (44 mL). Lifestyle  Take daily care of your teeth and gums.  Stay active. Exercise for at least 30 minutes on 5 or more days each week.  Do not use any products that contain nicotine or tobacco, such as cigarettes, e-cigarettes, and chewing tobacco. If you need help quitting, ask your health care provider.  If you are sexually active, practice safe sex. Use a condom or other form of protection in order to prevent STIs (sexually transmitted infections).  Talk with your health care provider about taking a low-dose aspirin or statin. What's next?  Go to your health care provider once a year for a well check visit.  Ask your health care provider how often you should have your eyes and teeth checked.  Stay up to date on all vaccines. This information is not intended to replace advice given to you by your health care provider. Make sure you discuss any questions you have with your health care provider. Document Revised: 02/24/2018 Document Reviewed: 02/24/2018 Elsevier Patient Education  2020 [employer].

## 2019-11-30 ENCOUNTER — Other Ambulatory Visit: Payer: Self-pay | Admitting: Internal Medicine

## 2019-11-30 ENCOUNTER — Other Ambulatory Visit: Payer: Self-pay

## 2019-11-30 ENCOUNTER — Other Ambulatory Visit: Payer: HMO

## 2019-11-30 DIAGNOSIS — Z0000 Encounter for general adult medical examination without abnormal findings: Secondary | ICD-10-CM

## 2019-11-30 DIAGNOSIS — E785 Hyperlipidemia, unspecified: Secondary | ICD-10-CM

## 2019-11-30 DIAGNOSIS — E559 Vitamin D deficiency, unspecified: Secondary | ICD-10-CM

## 2019-11-30 DIAGNOSIS — E119 Type 2 diabetes mellitus without complications: Secondary | ICD-10-CM

## 2019-11-30 DIAGNOSIS — I10 Essential (primary) hypertension: Secondary | ICD-10-CM

## 2019-11-30 DIAGNOSIS — K219 Gastro-esophageal reflux disease without esophagitis: Secondary | ICD-10-CM

## 2019-11-30 NOTE — Progress Notes (Signed)
Unable to obtain specimen/cancelled labs and re-entered them as pt will go to [place] lab on 9.17.2021 for redraw after attempting to hydrate/thx dmf

## 2019-12-01 ENCOUNTER — Telehealth: Payer: Self-pay | Admitting: Internal Medicine

## 2019-12-01 ENCOUNTER — Other Ambulatory Visit (INDEPENDENT_AMBULATORY_CARE_PROVIDER_SITE_OTHER): Payer: HMO

## 2019-12-01 ENCOUNTER — Other Ambulatory Visit: Payer: Self-pay | Admitting: Internal Medicine

## 2019-12-01 DIAGNOSIS — I10 Essential (primary) hypertension: Secondary | ICD-10-CM

## 2019-12-01 DIAGNOSIS — E119 Type 2 diabetes mellitus without complications: Secondary | ICD-10-CM | POA: Diagnosis not present

## 2019-12-01 DIAGNOSIS — K219 Gastro-esophageal reflux disease without esophagitis: Secondary | ICD-10-CM

## 2019-12-01 DIAGNOSIS — Z0000 Encounter for general adult medical examination without abnormal findings: Secondary | ICD-10-CM

## 2019-12-01 DIAGNOSIS — Z13820 Encounter for screening for osteoporosis: Secondary | ICD-10-CM

## 2019-12-01 DIAGNOSIS — E785 Hyperlipidemia, unspecified: Secondary | ICD-10-CM

## 2019-12-01 DIAGNOSIS — E559 Vitamin D deficiency, unspecified: Secondary | ICD-10-CM

## 2019-12-01 DIAGNOSIS — Z780 Asymptomatic menopausal state: Secondary | ICD-10-CM

## 2019-12-01 LAB — CBC WITH DIFFERENTIAL/PLATELET
Basophils Absolute: 0 10*3/uL (ref 0.0–0.1)
Basophils Relative: 0.5 % (ref 0.0–3.0)
Eosinophils Absolute: 0.3 10*3/uL (ref 0.0–0.7)
Eosinophils Relative: 4 % (ref 0.0–5.0)
HCT: 37.5 % (ref 36.0–46.0)
Hemoglobin: 12.7 g/dL (ref 12.0–15.0)
Lymphocytes Relative: 33.4 % (ref 12.0–46.0)
Lymphs Abs: 2.1 10*3/uL (ref 0.7–4.0)
MCHC: 33.9 g/dL (ref 30.0–36.0)
MCV: 85.8 fl (ref 78.0–100.0)
Monocytes Absolute: 1 10*3/uL (ref 0.1–1.0)
Monocytes Relative: 15.7 % — ABNORMAL HIGH (ref 3.0–12.0)
Neutro Abs: 2.9 10*3/uL (ref 1.4–7.7)
Neutrophils Relative %: 46.4 % (ref 43.0–77.0)
Platelets: 189 10*3/uL (ref 150.0–400.0)
RBC: 4.37 Mil/uL (ref 3.87–5.11)
RDW: 14.2 % (ref 11.5–15.5)
WBC: 6.3 10*3/uL (ref 4.0–10.5)

## 2019-12-01 LAB — HEMOGLOBIN A1C: Hgb A1c MFr Bld: 6.1 % (ref 4.6–6.5)

## 2019-12-01 LAB — VITAMIN B12: Vitamin B-12: 310 pg/mL (ref 211–911)

## 2019-12-01 LAB — VITAMIN D 25 HYDROXY (VIT D DEFICIENCY, FRACTURES): VITD: 14.58 ng/mL — ABNORMAL LOW (ref 30.00–100.00)

## 2019-12-01 LAB — TSH: TSH: 1.45 u[IU]/mL (ref 0.35–4.50)

## 2019-12-01 NOTE — Addendum Note (Signed)
Addended by: Eddie North C on: 12/01/2019 10:24 AM   Modules accepted: Orders

## 2019-12-01 NOTE — Telephone Encounter (Signed)
Done.  No PT/INR per Dr Jerilee Hoh.

## 2019-12-01 NOTE — Telephone Encounter (Signed)
  Per [facility] lab need to recollect  CBC from pt need a new order there was  clumps and also  want an order for  PT/ INR  per Jackelyn Poling

## 2019-12-01 NOTE — Addendum Note (Signed)
Addended by: Jacob Moores on: 12/01/2019 02:42 PM   Modules accepted: Orders

## 2019-12-02 LAB — COMPREHENSIVE METABOLIC PANEL
AG Ratio: 1.3 (calc) (ref 1.0–2.5)
ALT: 18 U/L (ref 6–29)
AST: 20 U/L (ref 10–35)
Albumin: 4.5 g/dL (ref 3.6–5.1)
Alkaline phosphatase (APISO): 45 U/L (ref 37–153)
BUN: 16 mg/dL (ref 7–25)
CO2: 23 mmol/L (ref 20–32)
Calcium: 10 mg/dL (ref 8.6–10.4)
Chloride: 99 mmol/L (ref 98–110)
Creat: 0.93 mg/dL (ref 0.60–0.93)
Globulin: 3.6 g/dL (calc) (ref 1.9–3.7)
Glucose, Bld: 85 mg/dL (ref 65–99)
Potassium: 4.1 mmol/L (ref 3.5–5.3)
Sodium: 136 mmol/L (ref 135–146)
Total Bilirubin: 0.7 mg/dL (ref 0.2–1.2)
Total Protein: 8.1 g/dL (ref 6.1–8.1)

## 2019-12-02 LAB — LIPID PANEL W/REFLEX DIRECT LDL
Cholesterol: 171 mg/dL (ref <200)
HDL: 65 mg/dL (ref >=50)
LDL Cholesterol (Calc): 90 mg/dL (calc)
Non-HDL Cholesterol (Calc): 106 mg/dL (calc) (ref <130)
Total CHOL/HDL Ratio: 2.6 (calc) (ref <5.0)
Triglycerides: 70 mg/dL (ref <150)

## 2019-12-05 ENCOUNTER — Encounter: Payer: Self-pay | Admitting: Internal Medicine

## 2019-12-05 ENCOUNTER — Other Ambulatory Visit: Payer: Self-pay | Admitting: Internal Medicine

## 2019-12-05 ENCOUNTER — Other Ambulatory Visit: Payer: Self-pay | Admitting: *Deleted

## 2019-12-05 DIAGNOSIS — E785 Hyperlipidemia, unspecified: Secondary | ICD-10-CM

## 2019-12-05 DIAGNOSIS — G8929 Other chronic pain: Secondary | ICD-10-CM

## 2019-12-05 DIAGNOSIS — E559 Vitamin D deficiency, unspecified: Secondary | ICD-10-CM

## 2019-12-05 DIAGNOSIS — M545 Low back pain, unspecified: Secondary | ICD-10-CM

## 2019-12-05 DIAGNOSIS — M19012 Primary osteoarthritis, left shoulder: Secondary | ICD-10-CM

## 2019-12-05 MED ORDER — HYDROCODONE-ACETAMINOPHEN 10-325 MG PO TABS: 1.0000 | ORAL_TABLET | Freq: Four times a day (QID) | ORAL | 0 refills | Status: AC | PRN

## 2019-12-05 MED ORDER — EZETIMIBE 10 MG PO TABS: 10.0000 mg | ORAL_TABLET | Freq: Every day | ORAL | 1 refills | Status: DC

## 2019-12-05 MED ORDER — VITAMIN D (ERGOCALCIFEROL) 1.25 MG (50000 UNIT) PO CAPS: 50000.0000 [IU] | ORAL_CAPSULE | ORAL | 0 refills | Status: AC

## 2019-12-05 MED ORDER — HYDROCODONE-ACETAMINOPHEN 10-325 MG PO TABS: 1.0000 | ORAL_TABLET | Freq: Four times a day (QID) | ORAL | 0 refills | Status: DC | PRN

## 2019-12-08 DIAGNOSIS — J84112 Idiopathic pulmonary fibrosis: Secondary | ICD-10-CM

## 2019-12-08 NOTE — Research (Signed)
IPF-PRO Registry  Purpose: To collect data and biological samples that will support future research studies.  The goal of the registry is to research the current approaches to diagnosis, treatment, and progression of IPF. In addition, the registry will analyze participant characteristics, assess quality of life, describe participants interactions with the health care system, determine IPF treatment practices across multiple institutions, and utilize biological samples to identify disease biomarkers.  Clinical Research Coordinator / Research RN note : This visit for Subject Miranda [name] with DOB: 09/10/1945 on 12/08/2019 for the above protocol is Visit/Encounter #36 Month Follow-up and is for purpose of research. The consent for this encounter is under Protocol Version Amendment 4(12Sep2019)   and is currently IRB approved. Subject expressed continued interest and consent in continuing as a study subject. Subject confirmed that there was no change in contact information (e.g. address, telephone, email). Subject thanked for participation in research and contribution to science.   In this visit 12/08/2019 the subject completed the blood work and questionnaires per the above referenced protocol. Please refer to the subject's paper source binder for further details.  [place] Assistant [place], [place] 11:37 AM 12/08/2019

## 2019-12-12 DIAGNOSIS — M5136 Other intervertebral disc degeneration, lumbar region: Secondary | ICD-10-CM | POA: Diagnosis not present

## 2019-12-12 NOTE — Progress Notes (Signed)
PCP:  [name], [name], MD Primary Cardiologist: [name], MD Electrophysiologist: [name], MD   Miranda [name] is a 74 y.o. female seen today for Will Meredith Leeds, MD for routine electrophysiology followup.  Since last being seen in our clinic the patient reports doing well overall. She struggles with chronic arthritis which is worse this time of year. She is SOB with moderate exertion. She wears her CPAP sometimes, but is more comfortable without it. When she doesn't wear it, she gets poor sleep and feels more fatigued the next day. She denies exertional chest pain. She has occasional chest wall pain when her arthritis is flared up. No syncope. She has palpitations 1-2 times a month, ranging from 5 to a max of 30 minutes.   Past Medical History:  Diagnosis Date  . Anxiety   . Arthritis   . Asthma   . Atrial fibrillation ([place])   . CHF (congestive heart failure) ([place])    pt. unsure- but thinks she was hosp. for CHF- 2002  . [facility]    [facility]  . Clotting disorder ([place])    blood clots in lungs/PE pulmonary embolism  . Colon polyps   . Complication of anesthesia    states requires a lot med. to put her to sleep   . DDD (degenerative disc disease) 09/17/2011  . Depression    "sometimes "  . DM (diabetes mellitus) ([place])   . Family history of anesthesia complication   . GERD (gastroesophageal reflux disease)   . Glaucoma    bilateral, pt. admits that she is noncompliant to eye gtts.   . Hemorrhoids   . Hyperlipidemia   . Hypertension    had stress, echo- 2006 /w [place], Cardiac Cath, per pt. 2002, echo repeated 2012- wnl   . Low back pain   . Shortness of breath   . Sleep apnea    uses c-pap- q night recently   Past Surgical History:  Procedure Laterality Date  . ABDOMINAL HYSTERECTOMY     ectopic, fibroids  . arm surgery [name]   . CARDIAC CATHETERIZATION    . CATARACT EXTRACTION, BILATERAL     cataracts removed  bilateral- ?IOL  . COLONOSCOPY     remote  . FISSURECTOMY  10/08/2011   Procedure: FISSURECTOMY;  Surgeon: Stark Klein, MD;  Location: [facility];  Service: General;  Laterality: N/A;  . FLEXIBLE SIGMOIDOSCOPY  02/25/2011   Procedure: FLEXIBLE SIGMOIDOSCOPY;  Surgeon: Inda Castle, MD;  Location: [facility];  Service: Endoscopy;  Laterality: N/A;  . FOOT SURGERY     bilat, heel spurs- screw in R foot   . HEMORRHOID SURGERY  10/08/2011   Procedure: HEMORRHOIDECTOMY;  Surgeon: Stark Klein, MD;  Location: [facility];  Service: General;  Laterality: N/A;  External   . SPHINCTEROTOMY  10/08/2011   Procedure: Joan Mayans;  Surgeon: Stark Klein, MD;  Location: [facility];  Service: General;  Laterality: N/A;    Current Outpatient Medications  Medication Sig Dispense Refill  . ACETAMINOPHEN PO Take by mouth as needed (pain).    Marland Kitchen albuterol (VENTOLIN HFA) 108 (90 Base) MCG/ACT inhaler Inhale 1 puff into the lungs every 6 (six) hours as needed. 18 g 1  . ALPRAZolam (XANAX) 0.25 MG tablet Take 1 tablet (0.25 mg total) by mouth 3 (three) times daily as needed for anxiety. 90 tablet 0  . amLODipine (NORVASC) 10 MG tablet Take 1 tablet (10 mg total) by mouth daily. 90 tablet  1  . atorvastatin (LIPITOR) 80 MG tablet Take 1 tablet by mouth once daily 90 tablet 1  . Continuous Blood Gluc Receiver (FREESTYLE LIBRE 14 DAY READER) DEVI 1 each by Does not apply route daily. 1 each 2  . Continuous Blood Gluc Sensor (FREESTYLE LIBRE 14 DAY SENSOR) MISC USE  ONCE DAILY 3 each 0  . cyclobenzaprine (FLEXERIL) 5 MG tablet TAKE 1 TABLET BY MOUTH AT BEDTIME 45 tablet 0  . diclofenac sodium (VOLTAREN) 1 % GEL Apply 2 g topically 4 (four) times daily. 1 Tube 1  . docusate sodium (COLACE) 100 MG capsule Take 1 capsule (100 mg total) by mouth 2 (two) times daily. 10 capsule 0  . dronedarone (MULTAQ) 400 MG tablet TAKE 1 TABLET BY MOUTH TWICE DAILY WITH A MEAL Filled by Dr [name] 180 tablet 3  . ezetimibe (ZETIA) 10 MG  tablet Take 1 tablet (10 mg total) by mouth daily. 90 tablet 1  . furosemide (LASIX) 40 MG tablet Take 0.5 tablets (20 mg total) by mouth daily. 90 tablet 1  . hydrochlorothiazide (HYDRODIURIL) 25 MG tablet Take 1 tablet (25 mg total) by mouth daily. 90 tablet 1  . HYDROcodone-acetaminophen (NORCO) 10-325 MG tablet Take 1 tablet by mouth every 6 (six) hours as needed. 90 tablet 0  . lactulose (CONSTULOSE) 10 GM/15ML solution TAKE 30 MILLILITERS BY MOUTH  THREE TIMES DAILY AS NEEDED FOR MILD CONSTIPATION 473 mL 0  . losartan (COZAAR) 100 MG tablet Take 1 tablet by mouth once daily 90 tablet 0  . metFORMIN (GLUCOPHAGE XR) 500 MG 24 hr tablet Take 500 mg by mouth daily with breakfast.    . Pirfenidone 801 MG TABS Take 801 mg by mouth in the morning, at noon, and at bedtime.    . polyethylene glycol powder (GLYCOLAX/MIRALAX) powder Take 17 g by mouth daily. 3350 g 1  . rivaroxaban (XARELTO) 20 MG TABS tablet TAKE 1 TABLET BY MOUTH ONCE DAILY WITH SUPPER 90 tablet 1  . Vitamin D, Ergocalciferol, (DRISDOL) 1.25 MG (50000 UNIT) CAPS capsule Take 1 capsule (50,000 Units total) by mouth every 7 (seven) days for 12 doses. 12 capsule 0   No current facility-administered medications for this visit.    Allergies  Allergen Reactions  . Fish Oil Anaphylaxis  . Other Hives, Shortness Of Breath and Swelling    Allergic to cashew nuts and peanut oil.  Marland Kitchen Penicillins Anaphylaxis, Hives and Swelling    Has patient had a PCN reaction causing immediate rash, facial/tongue/throat swelling, SOB or lightheadedness with hypotension: Face swelling and hives started first, then swelling of the throat  Has patient had a PCN reaction causing severe rash involving mucus membranes or skin necrosis: Yes  Has patient had a PCN reaction that required hospitalization: No  Has patient had a PCN reaction occurring within the last 10 years: Yes  If all of the above answers are "NO", then may proceed with Cephalospor  .  Pneumococcal Vaccines Nausea And Vomiting  . Cashew Nut Oil   . Nitrofurantoin Macrocrystal Hives  . Peanut Oil   . Aspirin Itching and Rash  . Bactrim [Sulfamethoxazole-Trimethoprim] Hives, Itching and Rash  . Ciprofloxacin Hives, Itching and Rash  . Ibuprofen Rash  . Influenza Vaccines Hives  . Ivp Dye [Iodinated Diagnostic Agents] Hives, Itching and Rash    Gives benadryl to counteract symptoms  . Latex Rash  . Macrobid [Nitrofurantoin Monohydrate Macrocrystals] Hives  . Shellfish Allergy Hives    Patient also allergic to  seafood    Social History   Socioeconomic History  . Marital status: Married    Spouse name: Not on file  . Number of children: 4  . Years of education: Not on file  . Highest education level: Not on file  Occupational History  . Occupation: retired Forensic psychologist: UNEMPLOYED  Tobacco Use  . Smoking status: Never Smoker  . Smokeless tobacco: Never Used  Vaping Use  . Vaping Use: Never used  Substance and Sexual Activity  . Alcohol use: No    Alcohol/week: 0.0 standard drinks  . Drug use: No  . Sexual activity: Never  Other Topics Concern  . Not on file  Social History Narrative  . Not on file   Social Determinants of Health   Financial Resource Strain: Low Risk   . Difficulty of Paying Living Expenses: Not hard at all  Food Insecurity: No Food Insecurity  . Worried About Charity fundraiser in the Last Year: Never true  . Ran Out of Food in the Last Year: Never true  Transportation Needs: No Transportation Needs  . Lack of Transportation (Medical): No  . Lack of Transportation (Non-Medical): No  Physical Activity:   . Days of Exercise per Week: Not on file  . Minutes of Exercise per Session: Not on file  Stress:   . Feeling of Stress : Not on file  Social Connections:   . Frequency of Communication with Friends and Family: Not on file  . Frequency of Social Gatherings with Friends and Family: Not on file  . Attends Religious Services:  Not on file  . Active Member of Clubs or Organizations: Not on file  . Attends Archivist Meetings: Not on file  . Marital Status: Not on file  Intimate Partner Violence:   . Fear of Current or Ex-Partner: Not on file  . Emotionally Abused: Not on file  . Physically Abused: Not on file  . Sexually Abused: Not on file     Review of Systems: General: No chills, fever, night sweats or weight changes  Cardiovascular:  No chest pain, dyspnea on exertion, edema, orthopnea, palpitations, paroxysmal nocturnal dyspnea Dermatological: No rash, lesions or masses Respiratory: No cough, dyspnea Urologic: No hematuria, dysuria Abdominal: No nausea, vomiting, diarrhea, bright red blood per rectum, melena, or hematemesis Neurologic: No visual changes, weakness, changes in mental status All other systems reviewed and are otherwise negative except as noted above.  Physical Exam: There were no vitals filed for this visit.  GEN- The patient is well appearing, alert and oriented x 3 today.   HEENT: normocephalic, atraumatic; sclera clear, conjunctiva pink; hearing intact; oropharynx clear; neck supple, no JVP Lymph- no cervical lymphadenopathy Lungs- Clear to ausculation bilaterally, normal work of breathing.  No wheezes, rales, rhonchi Heart- Regular rate and rhythm, no murmurs, rubs or gallops, PMI not laterally displaced GI- soft, non-tender, non-distended, bowel sounds present, no hepatosplenomegaly Extremities- no clubbing, cyanosis, or edema; DP/PT/radial pulses 2+ bilaterally MS- no significant deformity or atrophy Skin- warm and dry, no rash or lesion Psych- euthymic mood, full affect Neuro- strength and sensation are intact  EKG is ordered. Personal review of EKG from today shows sinus bradycardia at 51 bpm with QRS of 74 ms and PR interval of 166 ms  Additional studies reviewed include: Previous EP office notes  Assessment and Plan:  1.  Paroxysmal atrial fibrillation:    Continue on Xarelto and Multaq.  CHA2DS2-VASc of 4. EKG today demonstrates sinus  bradycardia She continues to have occasional episodes of AF.   2. HTN Continue current medications  3. HLD Continue statin  4. OSA Encouraged nightly CPAP.   5.  Coronary artery disease: Denies symptoms of ischemia Has a calcified LAD and circumflex previously found on CT scan.   Normal myoview 2019 and normal EF on myoview and most recent echo.  Doing well and stable overall. RTC 6 months for usual follow up. Sooner with symptoms.   Shirley Friar, PA-C  12/12/19 7:49 PM

## 2019-12-13 ENCOUNTER — Encounter: Payer: Self-pay | Admitting: Student

## 2019-12-13 ENCOUNTER — Ambulatory Visit: Payer: HMO | Admitting: Student

## 2019-12-13 ENCOUNTER — Other Ambulatory Visit: Payer: Self-pay

## 2019-12-13 VITALS — BP 118/60 | HR 51 | Ht 64.5 in | Wt 176.0 lb

## 2019-12-13 DIAGNOSIS — I480 Paroxysmal atrial fibrillation: Secondary | ICD-10-CM

## 2019-12-13 DIAGNOSIS — I10 Essential (primary) hypertension: Secondary | ICD-10-CM | POA: Diagnosis not present

## 2019-12-13 DIAGNOSIS — G4733 Obstructive sleep apnea (adult) (pediatric): Secondary | ICD-10-CM

## 2019-12-13 DIAGNOSIS — I2510 Atherosclerotic heart disease of native coronary artery without angina pectoris: Secondary | ICD-10-CM | POA: Diagnosis not present

## 2019-12-13 DIAGNOSIS — E785 Hyperlipidemia, unspecified: Secondary | ICD-10-CM

## 2019-12-13 NOTE — Patient Instructions (Signed)
Medication Instructions:  *If you need a refill on your cardiac medications before your next appointment, please call your pharmacy*  Follow-Up: At [facility], you and your health needs are our priority.  As part of our continuing mission to provide you with exceptional heart care, we have created designated Provider Care Teams.  These Care Teams include your primary Cardiologist (physician) and Advanced Practice Providers (APPs -  Physician Assistants and Nurse Practitioners) who all work together to provide you with the care you need, when you need it.  We recommend signing up for the patient portal called "MyChart".  Sign up information is provided on this After Visit Summary.  MyChart is used to connect with patients for Virtual Visits (Telemedicine).  Patients are able to view lab/test results, encounter notes, upcoming appointments, etc.  Non-urgent messages can be sent to your provider as well.   To learn more about what you can do with MyChart, go to [email or web address].    Your next appointment:   Your physician recommends that you schedule a follow-up appointment in: 6 MONTHS with Dr. Curt Bears -- 06/04/20 at 11:00 am    The format for your next appointment:   In Person with Allegra Lai, MD

## 2019-12-15 ENCOUNTER — Ambulatory Visit
Admission: RE | Admit: 2019-12-15 | Discharge: 2019-12-15 | Disposition: A | Discharge status: Home / Self Care | Payer: HMO | Source: Ambulatory Visit | Attending: Pulmonary Disease | Admitting: Pulmonary Disease

## 2019-12-15 DIAGNOSIS — J849 Interstitial pulmonary disease, unspecified: Secondary | ICD-10-CM

## 2019-12-15 DIAGNOSIS — J84112 Idiopathic pulmonary fibrosis: Secondary | ICD-10-CM | POA: Diagnosis not present

## 2019-12-15 DIAGNOSIS — I2510 Atherosclerotic heart disease of native coronary artery without angina pectoris: Secondary | ICD-10-CM | POA: Diagnosis not present

## 2019-12-15 DIAGNOSIS — J479 Bronchiectasis, uncomplicated: Secondary | ICD-10-CM | POA: Diagnosis not present

## 2019-12-15 DIAGNOSIS — I700 Atherosclerosis of aorta: Secondary | ICD-10-CM | POA: Diagnosis not present

## 2019-12-20 DIAGNOSIS — Z961 Presence of intraocular lens: Secondary | ICD-10-CM | POA: Diagnosis not present

## 2019-12-20 DIAGNOSIS — E119 Type 2 diabetes mellitus without complications: Secondary | ICD-10-CM | POA: Diagnosis not present

## 2019-12-20 DIAGNOSIS — H40023 Open angle with borderline findings, high risk, bilateral: Secondary | ICD-10-CM | POA: Diagnosis not present

## 2019-12-21 ENCOUNTER — Other Ambulatory Visit: Payer: Self-pay | Admitting: Internal Medicine

## 2019-12-21 DIAGNOSIS — I10 Essential (primary) hypertension: Secondary | ICD-10-CM

## 2020-01-07 ENCOUNTER — Ambulatory Visit (INDEPENDENT_AMBULATORY_CARE_PROVIDER_SITE_OTHER): Payer: HMO

## 2020-01-07 ENCOUNTER — Ambulatory Visit
Admission: EM | Admit: 2020-01-07 | Discharge: 2020-01-07 | Disposition: A | Discharge status: Home / Self Care | Payer: HMO | Attending: Emergency Medicine | Admitting: Emergency Medicine

## 2020-01-07 ENCOUNTER — Encounter: Payer: Self-pay | Admitting: Emergency Medicine

## 2020-01-07 ENCOUNTER — Other Ambulatory Visit: Payer: Self-pay

## 2020-01-07 DIAGNOSIS — S92414A Nondisplaced fracture of proximal phalanx of right great toe, initial encounter for closed fracture: Secondary | ICD-10-CM

## 2020-01-07 DIAGNOSIS — S92424A Nondisplaced fracture of distal phalanx of right great toe, initial encounter for closed fracture: Secondary | ICD-10-CM | POA: Diagnosis not present

## 2020-01-07 DIAGNOSIS — W108XXA Fall (on) (from) other stairs and steps, initial encounter: Secondary | ICD-10-CM

## 2020-01-07 DIAGNOSIS — M79674 Pain in right toe(s): Secondary | ICD-10-CM | POA: Diagnosis not present

## 2020-01-07 NOTE — ED Provider Notes (Signed)
[facility]    CSN: [id number] Arrival date & time: 01/07/20  1137      History   Chief Complaint Chief Complaint  Patient presents with  . Toe Pain    HPI Miranda [name] is a 74 y.o. female  With extensive medical history as below presenting for right great toe pain s/p fall.  Patient writes history: States that she tripped on her stairs yesterday.  No head trauma, LOC.  Denies deformity, loss of sensation.  Does admit to bruising and swelling.  Used ice with some relief.  Requesting x-ray.  Past Medical History:  Diagnosis Date  . Anxiety   . Arthritis   . Asthma   . Atrial fibrillation ([place])   . CHF (congestive heart failure) ([place])    pt. unsure- but thinks she was hosp. for CHF- 2002  . [facility]  . Clotting disorder ([place])    blood clots in lungs/PE pulmonary embolism  . Colon polyps   . Complication of anesthesia    states requires a lot med. to put her to sleep   . DDD (degenerative disc disease) 09/17/2011  . Depression    "sometimes "  . DM (diabetes mellitus) ([place])   . Family history of anesthesia complication   . GERD (gastroesophageal reflux disease)   . Glaucoma    bilateral, pt. admits that she is noncompliant to eye gtts.   . Hemorrhoids   . Hyperlipidemia   . Hypertension    had stress, echo- 2006 /w [place], Cardiac Cath, per pt. 2002, echo repeated 2012- wnl   . Low back pain   . Shortness of breath   . Sleep apnea    uses c-pap- q night recently    Patient Active Problem List   Diagnosis Date Noted  . Vitamin D deficiency 12/05/2019  . Healthcare maintenance 11/09/2018  . IPF (idiopathic pulmonary fibrosis) ([place]) 11/09/2018  . Therapeutic drug monitoring 04/20/2018  . Paroxysmal atrial fibrillation ([place]) 04/20/2018  . Atrial fibrillation with RVR ([place]) 11/03/2017  . LVH (left ventricular hypertrophy) 09/22/2017  . Diabetes mellitus without complication ([place]) 55/97/4163  . Degeneration  of lumbar intervertebral disc 07/06/2017  . Pulmonary emboli ([place]) 05/02/2016  . Right leg pain 05/01/2016  . ILD (interstitial lung disease) ([place]) 05/01/2016  . Bradycardia 05/01/2016  . Coronary artery calcification seen on CAT scan 08/06/2015  . Open angle with borderline findings, low risk, bilateral 04/29/2015  . UIP (usual interstitial pneumonitis) ([place]) 04/05/2015  . Bilateral posterior capsular opacification 09/21/2014  . Pseudophakia of both eyes 07/27/2014  . Combined form of senile cataract of both eyes 05/30/2014  . Osteoarthritis 09/17/2011  . Narrowing of intervertebral disc space 09/17/2011  . Pyelonephritis with possible nonobstructing 5 mm calculus by renal ultrasound 09/16/2011  . Hypokalemia 09/16/2011  . External hemorrhoids 09/13/2011  . Obstructive sleep apnea syndrome 06/10/2011  . Anal fissure 02/17/2011  . History of colonic polyps 04/03/2010  . Respiratory condition due to chemical fumes and vapors ([place]) 01/20/2010  . Urinary incontinence 04/30/2009  . Insomnia 09/28/2007  . Asthma 11/22/2006  . Dyslipidemia 08/19/2006  . HTN (hypertension) 08/19/2006  . GERD 08/19/2006  . Low back pain 08/19/2006    Past Surgical History:  Procedure Laterality Date  . ABDOMINAL HYSTERECTOMY     ectopic, fibroids  . arm surgery Right   . CARDIAC CATHETERIZATION    . CATARACT EXTRACTION, BILATERAL     cataracts removed bilateral- ?IOL  .  COLONOSCOPY     remote  . FISSURECTOMY  10/08/2011   Procedure: FISSURECTOMY;  Surgeon: Stark Klein, MD;  Location: [facility];  Service: General;  Laterality: N/A;  . FLEXIBLE SIGMOIDOSCOPY  02/25/2011   Procedure: FLEXIBLE SIGMOIDOSCOPY;  Surgeon: Inda Castle, MD;  Location: [facility];  Service: Endoscopy;  Laterality: N/A;  . FOOT SURGERY     bilat, heel spurs- screw in R foot   . HEMORRHOID SURGERY  10/08/2011   Procedure: HEMORRHOIDECTOMY;  Surgeon: Stark Klein, MD;  Location: [facility];  Service: General;  Laterality: N/A;   External   . SPHINCTEROTOMY  10/08/2011   Procedure: Joan Mayans;  Surgeon: Stark Klein, MD;  Location: [facility];  Service: General;  Laterality: N/A;    OB History   No obstetric history on file.      Home Medications    Prior to Admission medications   Medication Sig Start Date End Date Taking? Authorizing Provider  ACETAMINOPHEN PO Take by mouth as needed (pain).    [provider]  albuterol (VENTOLIN HFA) 108 (90 Base) MCG/ACT inhaler Inhale 1 puff into the lungs every 6 (six) hours as needed. 06/23/19   [name], [name], MD  ALPRAZolam Duanne Moron) 0.25 MG tablet Take 1 tablet (0.25 mg total) by mouth 3 (three) times daily as needed for anxiety. 11/22/19   [name], [name], MD  amLODipine (NORVASC) 10 MG tablet Take 1 tablet (10 mg total) by mouth daily. 06/23/19   [name], [name], MD  atorvastatin (LIPITOR) 80 MG tablet Take 1 tablet by mouth once daily 11/22/18   [name], [name], MD  Continuous Blood Gluc Receiver (FREESTYLE LIBRE 14 DAY READER) DEVI 1 each by Does not apply route daily. 06/23/19   [name], [name], MD  Continuous Blood Gluc Sensor (FREESTYLE LIBRE 14 DAY SENSOR) MISC APPLY ONE Huey Bienen[place], [place] EVERY 14 DAYS 12/21/19   [name], [name], MD  cyclobenzaprine (FLEXERIL) 5 MG tablet TAKE 1 TABLET BY MOUTH AT BEDTIME 10/27/19   [name], [name], MD  diclofenac sodium (VOLTAREN) 1 % GEL Apply 2 g topically 4 (four) times daily. 11/04/17   Caren Griffins, MD  docusate sodium (COLACE) 100 MG capsule Take 1 capsule (100 mg total) by mouth 2 (two) times daily. Patient taking differently: Take 100 mg by mouth as needed.  06/09/18   [name], [name], MD  dronedarone (MULTAQ) 400 MG tablet TAKE 1 TABLET BY MOUTH TWICE DAILY WITH A MEAL Filled by Dr [name] 06/15/19   [name], [name], MD  ezetimibe (ZETIA) 10 MG tablet Take 1 tablet (10 mg total) by mouth daily. 12/05/19   [name], [name],  MD  furosemide (LASIX) 40 MG tablet Take 1/2 (one-half) tablet by mouth once daily 12/21/19   [name], [name], MD  hydrochlorothiazide (HYDRODIURIL) 25 MG tablet Take 1 tablet (25 mg total) by mouth daily. 11/22/19   [name], [name], MD  HYDROcodone-acetaminophen [facility]) 10-325 MG tablet Take 1 tablet by mouth every 6 (six) hours as needed. 12/05/19   [name], [name], MD  lactulose (CONSTULOSE) 10 GM/15ML solution TAKE 30 MILLILITERS BY MOUTH  THREE TIMES DAILY AS NEEDED FOR MILD CONSTIPATION 09/28/19   [name], [name], MD  losartan (COZAAR) 100 MG tablet Take 1 tablet by mouth once daily 07/04/19   [name], [name], MD  metFORMIN (GLUCOPHAGE XR) 500 MG 24 hr tablet Take 500 mg by mouth  daily with breakfast.    [provider]  Pirfenidone 801 MG TABS Take 801 mg by mouth in the morning, at noon, and at bedtime.    [provider]  polyethylene glycol powder (GLYCOLAX/MIRALAX) powder Take 17 g by mouth daily. 06/09/18   [name], [name], MD  rivaroxaban (XARELTO) 20 MG TABS tablet TAKE 1 TABLET BY MOUTH ONCE DAILY WITH SUPPER 06/23/19   [name], [name], MD  Vitamin D, Ergocalciferol, (DRISDOL) 1.25 MG (50000 UNIT) CAPS capsule Take 1 capsule (50,000 Units total) by mouth every 7 (seven) days for 12 doses. 12/05/19 02/21/20  Erline Hau, MD    Family History Family History  Problem Relation Age of Onset  . Heart attack Mother   . Heart disease Mother   . Breast cancer Mother   . Emphysema Sister   . Breast cancer Sister   . Arthritis/Rheumatoid Sister   . Asthma Sister   . Lung cancer Sister   . COPD Sister   . Colon cancer Brother   . Colon cancer Brother   . Anesthesia problems Neg Hx     Social History Social History   Tobacco Use  . Smoking status: Never Smoker  . Smokeless tobacco: Never Used  Vaping Use  . Vaping Use: Never used  Substance Use Topics  . Alcohol use: No     Alcohol/week: 0.0 standard drinks  . Drug use: No     Allergies   Fish oil, Other, Penicillins, Pneumococcal vaccines, Cashew nut oil, Nitrofurantoin macrocrystal, Peanut oil, Aspirin, Bactrim [sulfamethoxazole-trimethoprim], Ciprofloxacin, Ibuprofen, Influenza vaccines, Ivp dye [iodinated diagnostic agents], Latex, Macrobid [nitrofurantoin monohydrate macrocrystals], and Shellfish allergy   Review of Systems As per HPI   Physical Exam Triage Vital Signs ED Triage Vitals  Enc Vitals Group     BP      Pulse      Resp      Temp      Temp src      SpO2      Weight      Height      Head Circumference      Peak Flow      Pain Score      Pain Loc      Pain Edu?      Excl. in [place]?    No data found.  Updated Vital Signs BP (!) 147/74 (BP Location: Left Arm)   Pulse 83   Temp 98.1 F (36.7 C) (Oral)   Resp 18   SpO2 95%   Visual Acuity Right Eye Distance:   Left Eye Distance:   Bilateral Distance:    Right Eye Near:   Left Eye Near:    Bilateral Near:     Physical Exam Constitutional:      General: She is not in acute distress. HENT:     Head: Normocephalic and atraumatic.  Eyes:     General: No scleral icterus.    Pupils: Pupils are equal, round, and reactive to light.  Cardiovascular:     Rate and Rhythm: Normal rate.  Pulmonary:     Effort: Pulmonary effort is normal.  Musculoskeletal:        General: Swelling and tenderness present.     Comments: NVI  Skin:    Coloration: Skin is not jaundiced or pale.     Findings: Bruising present.  Neurological:     Mental Status: She is alert and oriented to person, place, and time.  UC Treatments / Results  Labs (all labs ordered are listed, but only abnormal results are displayed) Labs Reviewed - No data to display  EKG   Radiology DG Toe Great Right  Result Date: 01/07/2020 CLINICAL DATA:  Right great toe pain after tripping down stairs yesterday. EXAM: RIGHT GREAT TOE COMPARISON:  None.  FINDINGS: Limited lateral view, which is actually oblique. Incomplete lucency through the lateral base of the great toe distal phalanx. No dislocation. IMPRESSION: 1. Equivocal for nondisplaced fracture at the base of the distal phalanx. 2. Limited lateral view. Electronically Signed   By: Monte Fantasia M.D.   On: 01/07/2020 12:52    Procedures Procedures (including critical care time)  Medications Ordered in UC Medications - No data to display  Initial Impression / Assessment and Plan / UC Course  I have reviewed the triage vital signs and the nursing notes.  Pertinent labs & imaging results that were available during my care of the patient were reviewed by me and considered in my medical decision making (see chart for details).     X-ray with nondisplaced fracture base of distal phalanx.  Applied CAM Walker, recommended Ortho follow-up in 1-2 weeks.  Will be weightbearing during that time.  Supportive care as below.  Return precautions discussed, pt verbalized understanding and is agreeable to plan. Final Clinical Impressions(s) / UC Diagnoses   Final diagnoses:  Closed nondisplaced fracture of proximal phalanx of right great toe, initial encounter     Discharge Instructions     RICE: rest, ice, compression, elevation as needed for pain.    Pain medication:  350 mg-1000 mg of Tylenol (acetaminophen) and/or 200 mg - 800 mg of Advil (ibuprofen, Motrin) every 8 hours as needed.  May alternate between the two throughout the day as they are generally safe to take together.  DO NOT exceed more than 3000 mg of Tylenol or 3200 mg of ibuprofen in a 24 hour period as this could damage your stomach, kidneys, liver, or increase your bleeding risk.  Important to follow up with specialist(s) below for further evaluation/management if your symptoms persist or worsen.    ED Prescriptions    None     PDMP not reviewed this encounter.   [place], [place], [place] 01/07/20 1434

## 2020-01-07 NOTE — Discharge Instructions (Addendum)
RICE: rest, ice, compression, elevation as needed for pain.    Pain medication:  350 mg-1000 mg of Tylenol (acetaminophen) and/or 200 mg - 800 mg of Advil (ibuprofen, Motrin) every 8 hours as needed.  May alternate between the two throughout the day as they are generally safe to take together.  DO NOT exceed more than 3000 mg of Tylenol or 3200 mg of ibuprofen in a 24 hour period as this could damage your stomach, kidneys, liver, or increase your bleeding risk.   Important to follow up with specialist(s) below for further evaluation/management if your symptoms persist or worsen. 

## 2020-01-07 NOTE — ED Triage Notes (Signed)
Pt here for right great toe pain after tripping down stairs yesterday

## 2020-01-08 ENCOUNTER — Encounter: Payer: Self-pay | Admitting: Pharmacist

## 2020-01-08 ENCOUNTER — Other Ambulatory Visit: Payer: Self-pay

## 2020-01-08 NOTE — Patient Outreach (Signed)
[facility]) Care Management Chronic Special Needs Program  01/08/2020  Name: GENICE KIMBERLIN DOB: October 29, 1945  MRN: [id number]  Ms. Jodene Polyak is enrolled in a chronic special needs plan for Heart Failure. RNCM called to follow up on 24 hour nurse advice call, assess, review and update care plan.  Per 24 hour nurse advice line information client called to report a fall down the stairs, a swollen toe with follow up at urgent care that revealed fractured Right big toe.  Subjective: Client reports fall down stairs. She states no other reason, but missed a step going down. Per urgent care fracture of big toe-right foot. Client to follow up in two weeks with ortho. Client reports blood sugar today 80 normally ranges 70-104. She denies any issues with signs/symptoms of heart failure exacerbation. Client reports she attends provider visits as scheduled and denies any difficulty obtaining medications. Client reports she has completed health risk assessment for 2022.  Goals Addressed              This Visit's Progress   .  COMPLETED: Client verbalize knowledge of Heart Failure disease self management skills within the next 6 months.        Signs and symptoms of heart failure worsening reviewed.  Continue to weigh self daily and write down weights. Continue to follow a low salt meal plan, read food labels for the salt content. Call your provider for any questions, problems or signs of worsening condition.    .  Client will report no worsening of symptoms of Atrial Fibrillation within the next 6 months   On track     Continue to attend provider visits as scheduled. Call your provider if you have a racing or irregular heartbeat that may be uncomfortable, shortness of breath with or without chest pain, weakness and dizziness. Cardiologist visit completed 12/13/19; 05/29/19 and 05/12/19.    Marland Kitchen  COMPLETED: Client will report no worsening of symptoms related to heart disease within  the next 6 months         Reports no worsening of symptoms.    .  COMPLETED: Client will verbalize knowledge of chronic lung disease as evidenced by no ED visits or Inpatient stays related to chronic lung disease         No hospitalizations or emergency room visits due to chronic lung disase.     .  COMPLETED: Client will verbalize knowledge of self management of Hypertension as evidences by BP reading of 140/90 or less; or as defined by provider        Last documented blood pressure 12/13/19 Blood pressure 118/60. Reinforced importance of managing blood pressure. Continue to check your blood pressure and call your provider with any questions or concerns.    Marland Kitchen  LIFESTYLE - DECREASE FALLS RISK   On track     Discussed fall prevention strategies. Encouraged client to change positions slowly. Be mindful when taking the stairs. Fall prevention handout provided.Please review and call if you have any questions.    .  COMPLETED: Obtain annual  Lipid Profile, LDL-C        Done 12/01/2019    .  Patient Stated: maintain bowel regimen to minimize constipation (pt-stated)   On track     Discussed client's bowl regimen. RN care coordinator encouraged client to consistently follow provider recommendations to relieve constipation. Continue to take medications recommended by your provider. Continue to work towards having at least one bowel movement every day. Add lots  of foods with fiber to your meal planning. Discussed the importance of water in having bowel movements. Contact your provider or the pharmacist in your doctors office that has been working with you on this, if you continue to have problems or with questions or concerns.     .  COMPLETED: Visit Primary Care Provider or Cardiologist at least 2 times per year        Reports has seen provider least twice this year: 03/28/19 and 11/29/19. Encouraged to continue to attend provider visits as recommended.          Plan: send updated care  plan to client; send updated care plan to primary care provider. [employer] RNCM will outreach per Tier level in 6 months. [employer] Case Management Team will follow member moving forward with assessments, care plans and documentation.     Thea Silversmith, RN, MSN, [place] [phone]

## 2020-01-08 NOTE — Chronic Care Management (AMB) (Signed)
Error

## 2020-01-11 DIAGNOSIS — M79671 Pain in right foot: Secondary | ICD-10-CM | POA: Diagnosis not present

## 2020-01-18 DIAGNOSIS — M5136 Other intervertebral disc degeneration, lumbar region: Secondary | ICD-10-CM | POA: Diagnosis not present

## 2020-01-22 ENCOUNTER — Other Ambulatory Visit: Payer: Self-pay | Admitting: Internal Medicine

## 2020-01-22 DIAGNOSIS — M545 Other chronic pain: Secondary | ICD-10-CM

## 2020-01-22 DIAGNOSIS — G8929 Other chronic pain: Secondary | ICD-10-CM

## 2020-01-22 DIAGNOSIS — M25512 Pain in left shoulder: Secondary | ICD-10-CM

## 2020-01-22 DIAGNOSIS — I10 Essential (primary) hypertension: Secondary | ICD-10-CM

## 2020-01-28 ENCOUNTER — Other Ambulatory Visit: Payer: Self-pay | Admitting: Internal Medicine

## 2020-01-29 ENCOUNTER — Telehealth: Payer: Self-pay | Admitting: Internal Medicine

## 2020-01-29 MED ORDER — FREESTYLE LIBRE 14 DAY SENSOR MISC: 0 refills | Status: DC

## 2020-01-29 NOTE — Telephone Encounter (Signed)
Patient is calling and stated that she hit her [place] StyleSensor because the one she has id no longer working. Pt uses [facility], [address] [place]) [place] [address]  Phone:  [phone] Fax:  [phone]

## 2020-02-01 ENCOUNTER — Other Ambulatory Visit: Payer: Self-pay

## 2020-02-01 DIAGNOSIS — G4733 Obstructive sleep apnea (adult) (pediatric): Secondary | ICD-10-CM | POA: Diagnosis not present

## 2020-02-01 DIAGNOSIS — J9610 Chronic respiratory failure, unspecified whether with hypoxia or hypercapnia: Secondary | ICD-10-CM | POA: Diagnosis not present

## 2020-02-01 NOTE — Patient Outreach (Signed)
  [facility]) Care Management Chronic Special Needs Program    02/01/2020  Name: Miranda [name], DOB: 1945/10/19  MRN: [id number]   Ms. Shelaine Frie is enrolled in a chronic special needs plan for Heart Failure. [facility] will continue to provide services for this member through 03/15/2020. The [employer] Care Management Team will assume care 03/16/2020.   Thea Silversmith, RN, MSN, [place] [phone]

## 2020-02-02 ENCOUNTER — Telehealth: Payer: Self-pay | Admitting: Cardiology

## 2020-02-02 NOTE — Telephone Encounter (Signed)
Pt c/o of Chest Pain: STAT if CP now or developed within 24 hours  1. Are you having CP right now? Yes- pain over right breast all way through her back  2. Are you experiencing any other symptoms (ex. SOB, nausea, vomiting, sweating)?   3. How long have you been experiencing CP? 2 or 3 days ago  4. Is your CP continuous or coming and going? Comes and goes   5. Have you taken Nitroglycerin?  Do not have any?

## 2020-02-02 NOTE — Telephone Encounter (Signed)
Attempted to call pt mailbox is full unable to leave message Will try later ./cy

## 2020-02-02 NOTE — Telephone Encounter (Signed)
No answer once again and mailbox full Unable to leave message /cy

## 2020-02-05 ENCOUNTER — Emergency Department (HOSPITAL_COMMUNITY): Payer: HMO

## 2020-02-05 ENCOUNTER — Telehealth: Payer: Self-pay | Admitting: Cardiology

## 2020-02-05 ENCOUNTER — Emergency Department (HOSPITAL_COMMUNITY)
Admission: EM | Admit: 2020-02-05 | Discharge: 2020-02-05 | Disposition: A | Discharge status: Home / Self Care | Payer: HMO | Attending: Emergency Medicine | Admitting: Emergency Medicine

## 2020-02-05 ENCOUNTER — Encounter (HOSPITAL_COMMUNITY): Payer: Self-pay | Admitting: Emergency Medicine

## 2020-02-05 DIAGNOSIS — I509 Heart failure, unspecified: Secondary | ICD-10-CM | POA: Diagnosis not present

## 2020-02-05 DIAGNOSIS — Z7984 Long term (current) use of oral hypoglycemic drugs: Secondary | ICD-10-CM | POA: Diagnosis not present

## 2020-02-05 DIAGNOSIS — I130 Hypertensive heart and chronic kidney disease with heart failure and stage 1 through stage 4 chronic kidney disease, or unspecified chronic kidney disease: Secondary | ICD-10-CM | POA: Insufficient documentation

## 2020-02-05 DIAGNOSIS — N189 Chronic kidney disease, unspecified: Secondary | ICD-10-CM | POA: Diagnosis not present

## 2020-02-05 DIAGNOSIS — Z79899 Other long term (current) drug therapy: Secondary | ICD-10-CM | POA: Diagnosis not present

## 2020-02-05 DIAGNOSIS — R0789 Other chest pain: Secondary | ICD-10-CM | POA: Insufficient documentation

## 2020-02-05 DIAGNOSIS — J9811 Atelectasis: Secondary | ICD-10-CM | POA: Diagnosis not present

## 2020-02-05 DIAGNOSIS — E1122 Type 2 diabetes mellitus with diabetic chronic kidney disease: Secondary | ICD-10-CM | POA: Diagnosis not present

## 2020-02-05 DIAGNOSIS — R079 Chest pain, unspecified: Secondary | ICD-10-CM | POA: Diagnosis not present

## 2020-02-05 DIAGNOSIS — Z91040 Latex allergy status: Secondary | ICD-10-CM | POA: Insufficient documentation

## 2020-02-05 DIAGNOSIS — Z91010 Allergy to peanuts: Secondary | ICD-10-CM | POA: Insufficient documentation

## 2020-02-05 DIAGNOSIS — J45909 Unspecified asthma, uncomplicated: Secondary | ICD-10-CM | POA: Insufficient documentation

## 2020-02-05 LAB — BASIC METABOLIC PANEL
Anion gap: 12 (ref 5–15)
BUN: 16 mg/dL (ref 8–23)
CO2: 27 mmol/L (ref 22–32)
Calcium: 9.8 mg/dL (ref 8.9–10.3)
Chloride: 99 mmol/L (ref 98–111)
Creatinine, Ser: 0.99 mg/dL (ref 0.44–1.00)
GFR, Estimated: >60 mL/min (ref >60)
Glucose, Bld: 95 mg/dL (ref 70–99)
Potassium: 3.2 mmol/L — ABNORMAL LOW (ref 3.5–5.1)
Sodium: 138 mmol/L (ref 135–145)

## 2020-02-05 LAB — CBC
HCT: 41.5 % (ref 36.0–46.0)
Hemoglobin: 13.2 g/dL (ref 12.0–15.0)
MCH: 28 pg (ref 26.0–34.0)
MCHC: 31.8 g/dL (ref 30.0–36.0)
MCV: 88.1 fL (ref 80.0–100.0)
Platelets: 201 10*3/uL (ref 150–400)
RBC: 4.71 MIL/uL (ref 3.87–5.11)
RDW: 13.7 % (ref 11.5–15.5)
WBC: 5.8 10*3/uL (ref 4.0–10.5)
nRBC: 0 % (ref 0.0–0.2)

## 2020-02-05 LAB — TROPONIN I (HIGH SENSITIVITY): Troponin I (High Sensitivity): 6 ng/L (ref <18)

## 2020-02-05 LAB — CBG MONITORING, ED: Glucose-Capillary: 81 mg/dL (ref 70–99)

## 2020-02-05 MED ORDER — LIDOCAINE 5 % EX PTCH: 1.0000 | MEDICATED_PATCH | CUTANEOUS | 0 refills | Status: DC

## 2020-02-05 MED ORDER — LIDOCAINE 5 % EX PTCH
1.0000 | MEDICATED_PATCH | CUTANEOUS | Status: DC
  Administered 2020-02-05: 1 via TRANSDERMAL
  Filled 2020-02-05: qty 1

## 2020-02-05 NOTE — ED Notes (Signed)
Pt implantable meter read CBG 60. [facility] ED CBG of 81. 4 oz of orange juice provided per RN Scarlet

## 2020-02-05 NOTE — Telephone Encounter (Signed)
Patient is calling in due to having chest pain that has been on and off for the past week but has worsened in severity this morning. Patient describes the chest pain as sharp right breast pain that is radiating to her back, arm and shoulder blade. Patient denies N/V but does report that she has been sweating this morning.   Patient called the office on 11/19 Friday afternoon with similar symptoms but she states her phone was messed up so she was not able to answer or return calls.   Considering the patients symptoms and cardiac history, I advised the patient to go to the ED immediately to be evaluated. She verbalized understanding and is agreeable to the plan.

## 2020-02-05 NOTE — ED Triage Notes (Signed)
Pt reports R sided chest pain that began yesterday, radiating into her R shoulder, now having L sided chest pain. Hx of PEs, reports she is currently on anticoag therapy.

## 2020-02-05 NOTE — ED Notes (Signed)
Pt states she is diabetic and feels sugar dropping. Pt given happy meal, diet soda, 2 graham crackers per RN Scarlet.

## 2020-02-05 NOTE — ED Provider Notes (Addendum)
[facility] EMERGENCY DEPARTMENT Provider Note   CSN: [id number] Arrival date & time: 02/05/20  1129     History Chief Complaint  Patient presents with  . Chest Pain    Miranda [name] is a 74 y.o. female.  The history is provided by the patient and medical records.  Chest Pain  Miranda [name] is a 74 y.o. female who presents to the Emergency Department complaining of chest pain. She presents the emergency department complaining of chest pain that started about two weeks ago. She states that initially she slipped and fell down a few stairs and caught herself with her arms to prevent herself from getting hurt. Shortly thereafter she developed pain to her right upper chest wall and right upper back. Pain is worse with moving, twisting, palpation and deep breaths. She has a history of a fib, CHF, PE on Xarelto. She has tried hydrocodone at home with improvement in her pain. She denies any fevers, nausea, vomiting, difficulty breathing, abdominal pain. Symptoms are moderate and worsening in nature.    Past Medical History:  Diagnosis Date  . Anxiety   . Arthritis   . Asthma   . Atrial fibrillation ([place])   . CHF (congestive heart failure) ([place])    pt. unsure- but thinks she was hosp. for CHF- 2002  . [facility]    [facility]  . Clotting disorder ([place])    blood clots in lungs/PE pulmonary embolism  . Colon polyps   . Complication of anesthesia    states requires a lot med. to put her to sleep   . DDD (degenerative disc disease) 09/17/2011  . Depression    "sometimes "  . DM (diabetes mellitus) ([place])   . Family history of anesthesia complication   . GERD (gastroesophageal reflux disease)   . Glaucoma    bilateral, pt. admits that she is noncompliant to eye gtts.   . Hemorrhoids   . Hyperlipidemia   . Hypertension    had stress, echo- 2006 /w [place], Cardiac Cath, per pt. 2002, echo repeated 2012- wnl   . Low back pain   .  Shortness of breath   . Sleep apnea    uses c-pap- q night recently    Patient Active Problem List   Diagnosis Date Noted  . Vitamin D deficiency 12/05/2019  . Healthcare maintenance 11/09/2018  . IPF (idiopathic pulmonary fibrosis) ([place]) 11/09/2018  . Therapeutic drug monitoring 04/20/2018  . Paroxysmal atrial fibrillation ([place]) 04/20/2018  . Atrial fibrillation with RVR ([place]) 11/03/2017  . LVH (left ventricular hypertrophy) 09/22/2017  . Diabetes mellitus without complication ([place]) 28/41/3244  . Degeneration of lumbar intervertebral disc 07/06/2017  . Pulmonary emboli ([place]) 05/02/2016  . Right leg pain 05/01/2016  . ILD (interstitial lung disease) ([place]) 05/01/2016  . Bradycardia 05/01/2016  . Coronary artery calcification seen on CAT scan 08/06/2015  . Open angle with borderline findings, low risk, bilateral 04/29/2015  . UIP (usual interstitial pneumonitis) ([place]) 04/05/2015  . Bilateral posterior capsular opacification 09/21/2014  . Pseudophakia of both eyes 07/27/2014  . Combined form of senile cataract of both eyes 05/30/2014  . Osteoarthritis 09/17/2011  . Narrowing of intervertebral disc space 09/17/2011  . Pyelonephritis with possible nonobstructing 5 mm calculus by renal ultrasound 09/16/2011  . Hypokalemia 09/16/2011  . External hemorrhoids 09/13/2011  . Obstructive sleep apnea syndrome 06/10/2011  . Anal fissure 02/17/2011  . History of colonic polyps 04/03/2010  . Respiratory condition due to chemical fumes  and vapors ([place]) 01/20/2010  . Urinary incontinence 04/30/2009  . Insomnia 09/28/2007  . Asthma 11/22/2006  . Dyslipidemia 08/19/2006  . HTN (hypertension) 08/19/2006  . GERD 08/19/2006  . Low back pain 08/19/2006    Past Surgical History:  Procedure Laterality Date  . ABDOMINAL HYSTERECTOMY     ectopic, fibroids  . arm surgery Right   . CARDIAC CATHETERIZATION    . CATARACT EXTRACTION, BILATERAL     cataracts removed bilateral- ?IOL  . COLONOSCOPY      remote  . FISSURECTOMY  10/08/2011   Procedure: FISSURECTOMY;  Surgeon: Stark Klein, MD;  Location: [facility];  Service: General;  Laterality: N/A;  . FLEXIBLE SIGMOIDOSCOPY  02/25/2011   Procedure: FLEXIBLE SIGMOIDOSCOPY;  Surgeon: Inda Castle, MD;  Location: [facility];  Service: Endoscopy;  Laterality: N/A;  . FOOT SURGERY     bilat, heel spurs- screw in R foot   . HEMORRHOID SURGERY  10/08/2011   Procedure: HEMORRHOIDECTOMY;  Surgeon: Stark Klein, MD;  Location: [facility];  Service: General;  Laterality: N/A;  External   . SPHINCTEROTOMY  10/08/2011   Procedure: Joan Mayans;  Surgeon: Stark Klein, MD;  Location: [facility];  Service: General;  Laterality: N/A;     OB History   No obstetric history on file.     Family History  Problem Relation Age of Onset  . Heart attack Mother   . Heart disease Mother   . Breast cancer Mother   . Emphysema Sister   . Breast cancer Sister   . Arthritis/Rheumatoid Sister   . Asthma Sister   . Lung cancer Sister   . COPD Sister   . Colon cancer Brother   . Colon cancer Brother   . Anesthesia problems Neg Hx     Social History   Tobacco Use  . Smoking status: Never Smoker  . Smokeless tobacco: Never Used  Vaping Use  . Vaping Use: Never used  Substance Use Topics  . Alcohol use: No    Alcohol/week: 0.0 standard drinks  . Drug use: No    Home Medications Prior to Admission medications   Medication Sig Start Date End Date Taking? Authorizing Provider  ACETAMINOPHEN PO Take by mouth as needed (pain).    [provider]  albuterol (VENTOLIN HFA) 108 (90 Base) MCG/ACT inhaler Inhale 1 puff into the lungs every 6 (six) hours as needed. 06/23/19   [name], [name], MD  ALPRAZolam Duanne Moron) 0.25 MG tablet Take 1 tablet (0.25 mg total) by mouth 3 (three) times daily as needed for anxiety. 11/22/19   [name], [name], MD  amLODipine (NORVASC) 10 MG tablet Take 1 tablet (10 mg total) by mouth daily. 06/23/19   [name], [name], MD  atorvastatin (LIPITOR) 80 MG tablet Take 1 tablet by mouth once daily 11/22/18   [name], [name], MD  Continuous Blood Gluc Receiver (FREESTYLE LIBRE 14 DAY READER) DEVI 1 each by Does not apply route daily. 06/23/19   [name], [name], MD  Continuous Blood Gluc Sensor (FREESTYLE LIBRE 14 DAY SENSOR) MISC USE  ONCE DAILY 01/29/20   [name], [name], MD  cyclobenzaprine (FLEXERIL) 5 MG tablet TAKE 1 TABLET BY MOUTH AT BEDTIME 01/23/20   [name], [name], MD  diclofenac sodium (VOLTAREN) 1 % GEL Apply 2 g topically 4 (four) times daily. 11/04/17   Caren Griffins, MD  docusate sodium (COLACE) 100 MG capsule Take 1 capsule (100 mg total) by  mouth 2 (two) times daily. Patient taking differently: Take 100 mg by mouth as needed.  06/09/18   [name], [name], MD  dronedarone (MULTAQ) 400 MG tablet TAKE 1 TABLET BY MOUTH TWICE DAILY WITH A MEAL Filled by Dr [name] 06/15/19   [name], [name], MD  ezetimibe (ZETIA) 10 MG tablet Take 1 tablet (10 mg total) by mouth daily. 12/05/19   [name], [name], MD  furosemide (LASIX) 40 MG tablet Take 1/2 (one-half) tablet by mouth once daily 12/21/19   [name], [name], MD  hydrochlorothiazide (HYDRODIURIL) 25 MG tablet Take 1 tablet (25 mg total) by mouth daily. 11/22/19   [name], [name], MD  HYDROcodone-acetaminophen [facility]) 10-325 MG tablet Take 1 tablet by mouth every 6 (six) hours as needed. 12/05/19   [name], [name], MD  lactulose (CONSTULOSE) 10 GM/15ML solution TAKE 30 MILLILITERS BY MOUTH  THREE TIMES DAILY AS NEEDED FOR MILD CONSTIPATION 09/28/19   [name], [name], MD  lidocaine (LIDODERM) 5 % Place 1 patch onto the skin daily. Remove & Discard patch within 12 hours or as directed by MD 02/05/20   Quintella Reichert, MD  losartan (COZAAR) 100 MG tablet Take 1 tablet by mouth once daily 01/23/20   [name], [name], MD  metFORMIN  (GLUCOPHAGE XR) 500 MG 24 hr tablet Take 500 mg by mouth daily with breakfast.    [provider]  Pirfenidone 801 MG TABS Take 801 mg by mouth in the morning, at noon, and at bedtime.    [provider]  polyethylene glycol powder (GLYCOLAX/MIRALAX) powder Take 17 g by mouth daily. 06/09/18   [name], [name], MD  rivaroxaban (XARELTO) 20 MG TABS tablet TAKE 1 TABLET BY MOUTH ONCE DAILY WITH SUPPER 06/23/19   [name], [name], MD  Vitamin D, Ergocalciferol, (DRISDOL) 1.25 MG (50000 UNIT) CAPS capsule Take 1 capsule (50,000 Units total) by mouth every 7 (seven) days for 12 doses. 12/05/19 02/21/20  Erline Hau, MD    Allergies    Fish oil, Other, Penicillins, Pneumococcal vaccines, Cashew nut oil, Nitrofurantoin macrocrystal, Peanut oil, Aspirin, Bactrim [sulfamethoxazole-trimethoprim], Ciprofloxacin, Ibuprofen, Influenza vaccines, Ivp dye [iodinated diagnostic agents], Latex, Macrobid [nitrofurantoin monohydrate macrocrystals], and Shellfish allergy  Review of Systems   Review of Systems  Cardiovascular: Positive for chest pain.  All other systems reviewed and are negative.   Physical Exam Updated Vital Signs BP (!) 144/64   Pulse (!) 56   Temp 98.6 F (37 C)   Resp 20   SpO2 99%   Physical Exam Vitals and nursing note reviewed.  Constitutional:      Appearance: She is well-developed.  HENT:     Head: Normocephalic and atraumatic.  Cardiovascular:     Rate and Rhythm: Normal rate and regular rhythm.     Heart sounds: No murmur heard.   Pulmonary:     Effort: Pulmonary effort is normal. No respiratory distress.     Breath sounds: Normal breath sounds.  Abdominal:     Palpations: Abdomen is soft.     Tenderness: There is no abdominal tenderness. There is no guarding or rebound.  Musculoskeletal:        General: No tenderness.     Comments: 2+ radial pulses bilaterally. There is tenderness to palpation over the right upper chest  wall and right axillary chest wall without any overlying ecchymosis. Range of motion is intact in the right shoulder. No significant lower  extremity edema.  Skin:    General: Skin is warm and dry.  Neurological:     Mental Status: She is alert and oriented to person, place, and time.  Psychiatric:        Behavior: Behavior normal.     ED Results / Procedures / Treatments   Labs (all labs ordered are listed, but only abnormal results are displayed) Labs Reviewed  BASIC METABOLIC PANEL - Abnormal; Notable for the following components:      Result Value   Potassium 3.2 (*)    All other components within normal limits  CBC  CBG MONITORING, ED  TROPONIN I (HIGH SENSITIVITY)  TROPONIN I (HIGH SENSITIVITY)    EKG EKG Interpretation  Date/Time:  Monday February 05 2020 11:38:07 EST Ventricular Rate:  75 PR Interval:  152 QRS Duration: 80 QT Interval:  396 QTC Calculation: 442 R Axis:   40 Text Interpretation: Normal sinus rhythm Cannot rule out Anterior infarct , age undetermined Abnormal ECG Confirmed by Quintella Reichert [phone]) on 02/05/2020 5:16:02 PM   Radiology DG Chest 2 View  Result Date: 02/05/2020 CLINICAL DATA:  chest pain EXAM: CHEST - 2 VIEW COMPARISON:  11/02/2017 chest radiograph and prior. 12/15/2019 CT chest. FINDINGS: Basilar interstitial prominence and hazy left predominant basilar opacities. No pneumothorax or pleural effusion. Cardiomediastinal silhouette within normal limits. No acute osseous abnormality. IMPRESSION: Basilar interstitial prominence and hazy opacities. Differential includes chronic sequela of fibrosis, atelectasis and infiltrate. Electronically Signed   By: Primitivo Gauze M.D.   On: 02/05/2020 12:12    Procedures Procedures (including critical care time)  Medications Ordered in ED Medications  lidocaine (LIDODERM) 5 % 1 patch (has no administration in time range)    ED Course  I have reviewed the triage vital signs and the nursing  notes.  Pertinent labs & imaging results that were available during my care of the patient were reviewed by me and considered in my medical decision making (see chart for details).    MDM Rules/Calculators/A&P                         patient with history of CHF, PE on anticoagulation here for evaluation of right sided chest pain. Pain is reproducible on examination. EKG is without acute ischemic changes in troponin is negative and pain has been ongoing for several days. Chest x-ray personally reviewed, this is consistent with chronic changes, no evidence of acute pneumonia, pneumothorax, effusion. Discussed with patient home care for chest wall pain. Discussed outpatient follow-up and return precautions.  Final Clinical Impression(s) / ED Diagnoses Final diagnoses:  Chest wall pain    Rx / DC Orders ED Discharge Orders         Ordered    lidocaine (LIDODERM) 5 %  Every 24 hours        02/05/20 1815           Quintella Reichert, MD 02/05/20 Tresa Moore    Quintella Reichert, MD 02/05/20 [phone]

## 2020-02-05 NOTE — ED Notes (Signed)
Patient verbalizes understanding of discharge instructions. Opportunity for questioning and answers were provided. Armband removed by staff, pt discharged from ED ambulatory to home.  

## 2020-02-05 NOTE — ED Notes (Signed)
EDP Ralene Bathe made aware that Second Troponin has not been collected nor resulted. Per MD, pt still appropriate for discharge at this time.

## 2020-02-05 NOTE — Telephone Encounter (Signed)
Pt c/o of Chest Pain: STAT if CP now or developed within 24 hours  1. Are you having CP right now? Yes  2. Are you experiencing any other symptoms (ex. SOB, nausea, vomiting, sweating)? sweating  3. How long have you been experiencing CP? A week and getting worse  4. Is your CP continuous or coming and going? Coming and going  5. Have you taken Nitroglycerin? No  ?

## 2020-02-06 ENCOUNTER — Other Ambulatory Visit: Payer: Self-pay

## 2020-02-06 NOTE — Patient Outreach (Signed)
  [facility]) Care Management Chronic Special Needs Program    02/06/2020  Name: Miranda [name], DOB: [birth year]  MRN: [id number]   Ms. Lasonya Hubner is enrolled in a chronic special needs plan for Heart Failure. Notification received that client called 24 hour nurse advice line regarding chest pain and they were unable to reach client. However, client was seen, evalulated and sent home from the emergency department.   Plan: RNCM called to follow up. Unable to reach client. RNCM will await return call and continue to follow.   Thea Silversmith, RN, MSN, [place] [phone]

## 2020-02-07 NOTE — Telephone Encounter (Signed)
Spoke with the pt, who states she was seen in the ER for the chest pain our office has been attempting to discuss with her for the last couple of days. ER notes state ACS ruled out and chest wall pain is believed to be the cause of her symptoms. The pt was sent home with a lidocaine patch. States she still has residual discomfort today, but was told by ER physicians that this could take a week or so to resolve. States she is otherwise doing well. Advised the pt to call and schedule a follow up visit with Dr. [name] for February per the recall in [place]. The patient verbalizes understanding and agreement with plan.  Additionally, I advised pt that I could see a recent note from Thea Silversmith, RN from [place] had attempted to reach the pt. Gave the pt the contact info per the note and urged her to return this call to Ms. Juleen China.

## 2020-02-13 ENCOUNTER — Other Ambulatory Visit: Payer: Self-pay

## 2020-02-13 NOTE — Patient Outreach (Signed)
  [facility]) Care Management Chronic Special Needs Program    02/13/2020  Name: ALDORA PERMAN, DOB: 1945-09-04  MRN: [id number]   Ms. Akya Fiorello is enrolled in a chronic special needs plan for Diabetes. RNCM called to follow up post nurse line call. No answer. Unable to leave message. 2nd outreach attempt.  However, per chart, client has followed up in the ED and evaluated: EKG without acute ischemic changes in troponin negative; chest x-ray with no evidence of acute pneumonia, pneumothorax, effusion. Per chart, chest wall pain, discussion per provider regarding chest wall pain. Client discharged to home with outpatient follow up.    Plan: [facility] Management will continue to provide services for this member through 03/15/2020. [facility] Care Management Team will assume care 03/16/2020.  Thea Silversmith, RN, MSN, [place] [phone]

## 2020-02-14 ENCOUNTER — Other Ambulatory Visit: Payer: Self-pay | Admitting: Internal Medicine

## 2020-02-14 NOTE — Telephone Encounter (Signed)
Spoke to patient she stated chest pain is better.Follow up appointment scheduled with Dr.Jordan 04/18/20 at 9:40 am.

## 2020-02-14 NOTE — Telephone Encounter (Signed)
Patient is calling and requesting a refill for HYDROcodone-acetaminophen [facility]) 10-325 MG tablet sent to [facility]  [address] [place]) [place] [address]  Phone:  [phone] Fax:  [phone]  CB is [phone]

## 2020-02-15 NOTE — Telephone Encounter (Signed)
Spoke with pharmacy and the refill will be ready today.

## 2020-02-19 ENCOUNTER — Ambulatory Visit: Payer: Self-pay

## 2020-02-20 ENCOUNTER — Other Ambulatory Visit: Payer: Self-pay

## 2020-02-20 NOTE — Patient Outreach (Signed)
  [facility]) Care Management Chronic Special Needs Program    02/20/2020  Name: Miranda [name], DOB: 1945/10/18  MRN: [id number]   Miranda [name] is enrolled in a chronic special needs plan for Heart Failure. RNCM called to follow up post emergency room visit. Client reports, "I am doing better, I'm still having right side chest pain, but they said it is expected". She reports "7" on the pain scale and goes down to a "5". She reports using ice, heat and taking pain medication she previously had.  Client reports she has an appointment with her primary care provider tomorrow. She denies any signs/symptoms of heart failure exacerbation.  Client reports she is unable to afford Lidocaine patches stating they cost over $100. And is also interested in blister packaging of medications.  Goals Addressed            This Visit's Progress   . Patient Stated: client will report improved comfort within the next 6 months.       Attend provider visits as scheduled. Take medications as prescribed. Referral to [facility] pharmacist for medication assistance.       Plan: Pharmacy referral. [facility] Management will continue to provide services for this member through 03/15/2020. [facility] Care Management Team will assume care 03/16/2020.  Thea Silversmith, RN, MSN, [place] [phone]

## 2020-02-21 ENCOUNTER — Ambulatory Visit: Payer: HMO | Admitting: Pharmacist

## 2020-02-21 DIAGNOSIS — I10 Essential (primary) hypertension: Secondary | ICD-10-CM

## 2020-02-21 DIAGNOSIS — E785 Hyperlipidemia, unspecified: Secondary | ICD-10-CM

## 2020-02-21 NOTE — Chronic Care Management (AMB) (Signed)
Chronic Care Management Pharmacy  Name: Miranda [name]  MRN: [id number] DOB: 1946-02-01  Initial Questions: 1. Have you seen any other providers since your last visit? Yes - dermatology & pulmonology 2. Any changes in your medicines or health? No   Chief Complaint/ HPI Miranda [name],  74 y.o. , female presents for their Follow-Up CCM visit with the clinical pharmacist via telephone due to COVID-19 Pandemic.  PCP : [name], [name], MD  Their chronic conditions include: Afib, DM, HTN, HLD, IPF/ asthma, fluid retention, low back pain, anxiety, constipation   Office Visits: 11/29/19 [name], MD: Patient presented for annual exam. No changes. Labs ordered and follow up in 3-4 months.  06/23/2019- [name], MD- patient presented for virtual visit for medication refills, rash of black patches over arms and face, and instructions on Xarelto. Due to being a telephone visit, patient was referred to dermatology. Patient instructed to hold Xarelto 5 days prior to procedure and to resume as soon as possible. Medications were refilled.   Consult Visit: 02/05/20 Patient presented to the ED for chest wall pain.  01/07/20 Patient presented to the ED for a [name] toe fracture.  12/20/19 Beatrix Fetters (optometry): Patient presented for diabetic eye exam.  12/13/19 Barrington Ellison (cardiology): Patient presented for Afib follow up. No changes made. Follow up in 6 months.  10/23/19 - Pulmonology - Rexene Edison, NP - Patient presented for follow up for OSA and pulmonary fibrosis. Noted 77% compliance with CPAP.  10/12/19 - Pulmonology - [name], MD - Patient presented for follow up for OSA and pulmonary fibrosis. MD does not believe Esbriet is the cause of her rash and continued it. Follow up in 3 months. Pulmonary function testing showed slight increase in lung function since Nov 2020.  10/04/2019 - Dermatology - Arlyss Gandy, PA-C - Patient  presented for follow up for rash evaluation. Patient reported COTZ sunscreen has been helping but still notes burning when outside. Will discuss with PCP photosensitizing medications (HCTZ) and reassess.   09/15/19 - Physical Therapy - [name] - Patient presented for physical therapy for cervical radiculopathy. Unable to review notes.  08/16/2019 - Dermatology - Arlyss Gandy, PA-C - Patient presented for initial visit for rash evaluation. Patient reported rash started about a year ago and has dark discoloration.   07/29/2019- Cardiology- Allegra Lai, MD- patient presented for office visit for afib evaluation (electrophysiology evaluation). Patient remains in sinus rhythm. Episodes occur every few weeks. BP mildly elevated. Coronary artery disease- patient has calcified LAD and circumflex on CT scan- patient denied chest pain. No changes made. Patient to follow up in 6 months.   Medications: Outpatient Encounter Medications as of 02/21/2020  Medication Sig Note  . albuterol (VENTOLIN HFA) 108 (90 Base) MCG/ACT inhaler Inhale 1 puff into the lungs every 6 (six) hours as needed.   . ALPRAZolam (XANAX) 0.25 MG tablet Take 1 tablet (0.25 mg total) by mouth 3 (three) times daily as needed for anxiety.   Marland Kitchen amLODipine (NORVASC) 10 MG tablet Take 1 tablet (10 mg total) by mouth daily.   Marland Kitchen atorvastatin (LIPITOR) 80 MG tablet Take 1 tablet by mouth once daily   . Biotin 5000 MCG CAPS Take 1 capsule by mouth daily.   . cyclobenzaprine (FLEXERIL) 5 MG tablet TAKE 1 TABLET BY MOUTH AT BEDTIME   . diclofenac sodium (VOLTAREN) 1 % GEL Apply 2 g topically 4 (four) times daily. 01/08/2020: Uses as needed.  . dronedarone (MULTAQ) 400  MG tablet TAKE 1 TABLET BY MOUTH TWICE DAILY WITH A MEAL Filled by Dr [name]   . ezetimibe (ZETIA) 10 MG tablet Take 1 tablet (10 mg total) by mouth daily.   . furosemide (LASIX) 40 MG tablet Take 1/2 (one-half) tablet by mouth once daily   . hydrochlorothiazide  (HYDRODIURIL) 25 MG tablet Take 1 tablet (25 mg total) by mouth daily.   . Lactobacillus (ACIDOPHILUS) 0.5 MG TABS Take 1 tablet by mouth daily.   Marland Kitchen losartan (COZAAR) 100 MG tablet Take 1 tablet by mouth once daily   . metFORMIN (GLUCOPHAGE-XR) 500 MG 24 hr tablet Take 500 mg by mouth daily with breakfast.   . Pirfenidone 801 MG TABS Take 801 mg by mouth in the morning, at noon, and at bedtime.   . rivaroxaban (XARELTO) 20 MG TABS tablet TAKE 1 TABLET BY MOUTH ONCE DAILY WITH SUPPER   . senna (SENOKOT) 8.6 MG TABS tablet Take 1 tablet by mouth daily as needed for mild constipation.   . [DISCONTINUED] lactulose (CONSTULOSE) 10 GM/15ML solution TAKE 30 MILLILITERS BY MOUTH  THREE TIMES DAILY AS NEEDED FOR MILD CONSTIPATION   . ACETAMINOPHEN PO Take by mouth as needed (pain).   . Continuous Blood Gluc Receiver (FREESTYLE LIBRE 14 DAY READER) DEVI 1 each by Does not apply route daily.   Marland Kitchen lidocaine (LIDODERM) 5 % Place 1 patch onto the skin daily. Remove & Discard patch within 12 hours or as directed by MD (Patient not taking: Reported on 02/20/2020)   . polyethylene glycol powder (GLYCOLAX/MIRALAX) powder Take 17 g by mouth daily.   . [EXPIRED] Vitamin D, Ergocalciferol, (DRISDOL) 1.25 MG (50000 UNIT) CAPS capsule Take 1 capsule (50,000 Units total) by mouth every 7 (seven) days for 12 doses.   . [DISCONTINUED] atorvastatin (LIPITOR) 80 MG tablet Take 1 tablet by mouth once daily   . [DISCONTINUED] Continuous Blood Gluc Sensor (FREESTYLE LIBRE 14 DAY SENSOR) MISC USE  ONCE DAILY   . [DISCONTINUED] docusate sodium (COLACE) 100 MG capsule Take 1 capsule (100 mg total) by mouth 2 (two) times daily. (Patient taking differently: Take 100 mg by mouth as needed. )   . [DISCONTINUED] HYDROcodone-acetaminophen (NORCO) 10-325 MG tablet Take 1 tablet by mouth every 6 (six) hours as needed.    No facility-administered encounter medications on file as of 02/21/2020.   Patient reported she could not afford  prescription lidocaine 5% patches as her insurance did not cover them. Recommended trial of over the counter 4% lidocaine patches. Patient requested transfer to pharmacy for adherence packaging and scheduled a follow up in person appointment.   Current Diagnosis/Assessment:  Goals Addressed            This Visit's Progress   . Pharmacy Care Plan       CARE PLAN ENTRY  Current Barriers:  . Chronic Disease Management support, education, and care coordination needs related to Hypertension, Hyperlipidemia, Diabetes, Atrial Fibrillation, Anxiety, and Constipation   Hypertension . Pharmacist Clinical Goal(s): o Over the next 90 days, patient will work with PharmD and providers to maintain BP goal <140/90 . Current regimen:   Amlodipine 10mg , 1 tablet once daily  Hydrochlorothiazide 25mg , 1 tablet once daily  Losartan 100mg , 1 tablet once daily . Interventions: o We discussed: DASH diet:  following a diet emphasizing fruits and vegetables and low-fat dairy products along with whole grains, fish, poultry, and nuts. Reducing red meats and sugars.  . Patient self care activities - Over the next 90  days, patient will: o Check blood pressure daily, document, and provide at future appointments o Ensure daily salt intake < 2300 mg/day o Make sure to check blood pressure after taking morning medications  Hyperlipidemia . Pharmacist Clinical Goal(s): o Over the next 90 days, patient will work with PharmD and providers to achieve LDL goal < 70 . Current regimen:  o Atorvastatin 80mg , 1 tablet once daily  . Interventions: . We discussed how a diet high in plant sterols (fruits/vegetables/nuts/whole grains/legumes) may reduce your cholesterol.  Encouraged increasing fiber to a daily intake of 10-25g/day  . We discussed the importance of taking atorvastatin to lower your risks of heart events and strokes . Patient self care activities - Over the next 90 days, patient will: o Make sure to take  atorvastatin 80 mg every day o Continue taking Zetia daily  Diabetes . Pharmacist Clinical Goal(s): o Over the next 90 days, patient will work with PharmD and providers to maintain A1c goal <7% . Current regimen:  o Metformin ER 500mg , 1 tablet once daily with breakfast  Patient self care activities - Over the next 90 days, patient will: o Check blood sugar with Freestyle Libre, document, and provide at future appointments o Contact provider with any episodes of hypoglycemia  Atrial fibrillation . Pharmacist Clinical Goal(s) o Over the next 90 days, patient will work with PharmD and providers to maintain sinus rhythm . Current regimen:  o dronedarone (Multaq) 400mg , 1 tablet twice daily with a meal  o rivaroxaban (Xarelto) 20mg , 1 tablet once daily with supper (blood clot prevention)  . Interventions: o We discussed:  monitoring for signs and symptoms for bleeding (coughing up blood, prolonged nose bleeds, black, tarry stools). . Patient self care activities - Over the next 90 days, patient will: o Continue current medications.   Anxiety . Pharmacist Clinical Goal(s) o Over the next 90 days, patient will work with PharmD and providers to improve in anxiety symptoms . Current regimen:   Alprazolam 0.25mg ,  1 tablet three times daily as needed for anxiety . Interventions: o Discussed medications that are long duration for anxiety.  . Patient self care activities o Patient will continue current medications and follow up if needed.  Constipation . Pharmacist Clinical Goal(s) o Over the next 90 days, patient will work with PharmD and providers to improve constipation.  . Current regimen:   docusate 100mg , 1 capsule twice daily  Lactulose solution, take 30 ML three times daily as needed for mild constipation . Interventions: o We discussed drinking plenty of water and increasing fiber intake to help with constipation . Patient self care activities o Patient will continue  current regimen and report back as needed.    Medication management . Pharmacist Clinical Goal(s): o Over the next 90 days, patient will work with PharmD and providers to achieve optimal medication adherence . Current [facility] (for Pirfenidone 801mg ) . Interventions o Comprehensive medication review performed. o Utilize [name] for medication synchronization, packaging and delivery . Patient self care activities - Over the next 90 days, patient will: o Focus on medication adherence by verifying supply of atorvastatin and include in daily pill box if not done so already.  o Take medications as prescribed o Report any questions or concerns to PharmD and/or provider(s)  Please see past updates related to this goal by clicking on the "Past Updates" button in the selected goal          AFIB  Patient reported being on  Xarelto due to history of PE.    Patient is currently rhythm controlled. HR: 60-65-70 BPM  Patient has failed these meds in past: none   Patient is currently controlled on the following medications:   dronedarone (Multaq) 400mg , 1 tablet twice daily with a meal   Anticoagulation  rivaroxaban (Xarelto) 20mg , 1 tablet once daily with supper    We discussed:  monitoring for signs and symptoms for bleeding (coughing up blood, prolonged nose bleeds, black, tarry stools).  Plan Continue current medications  Diabetes   Recent Relevant Labs: Lab Results  Component Value Date/Time   HGBA1C 6.1 12/01/2019 10:25 AM   HGBA1C 5.9 (A) 10/19/2018 09:51 AM   HGBA1C 6.0 02/02/2018 12:20 PM    Kidney Function Lab Results  Component Value Date/Time   CREATININE 0.99 02/05/2020 11:48 AM   CREATININE 0.93 12/01/2019 10:25 AM   CREATININE 0.89 10/12/2019 10:41 AM   GFR 75.22 10/12/2019 10:41 AM   GFRNONAA >60 02/05/2020 11:48 AM   GFRAA >60 11/04/2017 04:10 AM   Checking BG: Daily  Patient has CGM. Low in the mornings 60 (felt funny  - headache, tired), not often  Patient has failed these meds in past: none   Patient is currently controlled on the following medications:   Metformin ER 500mg , 1 tablet once daily with breakfast  (patient reported will take additional dose if BGs are "high" Unable to define high. She reports taking a 2nd dose about once every month)   Last diabetic Eye exam: No results found for: HMDIABEYEEXA  - patient reports last time was last year and states goes every 6 months due to glaucoma   Last diabetic Foot exam: No results found for: HMDIABFOOTEX  - denies persistent neuropathy  We discussed: diet and exercise extensively and how to recognize and treat signs of hypoglycemia  - she reports having some low blood sugars 3-4 times a month -Treating hypoglycemia: patient correctly treated with simple sugar; recommended eating protein once BG > 70 -Recommended BG before bedtime > 100   Plan Continue current medications. Overdue for A1c check. Consider stopping metformin if A1c is still low.  Patient requested a refill for Freestyle libre sensors, lactulose & Norco - sent message to [facility].  Hypertension   Office blood pressures are: BP Readings from Last 3 Encounters:  02/05/20 (!) 147/70  01/07/20 (!) 147/74  12/13/19 118/60   Patient has failed these meds in the past: benazepril, metoprolol   Patient checks BP at home daily  Patient home BP readings are ranging: 157/75 (before medicines), 180/70 (recent high - had a headache); 141/66, 129/69  Patient is controlled on:   Amlodipine 10mg , 1 tablet once daily  Hydrochlorothiazide 25mg , 1 tablet once daily  Losartan 100mg , 1 tablet once daily  We discussed diet and exercise extensively  - diet: patient reports working on it and has cut out salt and pork; patient tries to eat a lot more fish and vegetables - exercise- patient reports she walks most days; recommended goal of 150 min/week     Plan Continue current medications and  control with diet and exercise. Recommended moving losartan 100 mg to bedtime - BP assessment in 1 month.   Hyperlipidemia  LDL goal < 70  Lipid Panel     Component Value Date/Time   CHOL 171 12/01/2019 1025   CHOL 167 11/26/2017 0908   TRIG 70 12/01/2019 1025   HDL 65 12/01/2019 1025   HDL 61 11/26/2017 0908   CHOLHDL 2.6 12/01/2019  1025   VLDL 16.8 06/01/2014 1145   LDLCALC 90 12/01/2019 1025   LDLDIRECT 152.8 03/30/2011 0859   LABVLDL 15 11/26/2017 0908   The 10-year ASCVD risk score Mikey Bussing DC Jr., et al., 2013) is: 33.7%   Values used to calculate the score:     Age: 76 years     Sex: Female     Is Non-Hispanic African American: Yes     Diabetic: Yes     Tobacco smoker: No     Systolic Blood Pressure: 177 mmHg     Is BP treated: Yes     HDL Cholesterol: 65 mg/dL     Total Cholesterol: 171 mg/dL   Patient has failed these meds in past: lovastatin   Patient is currently uncontrolled on the following medications:  Atorvastatin 80mg , 1 tablet once daily - not taking  Ezetimibe 10 mg, 1 tablet once daily  We discussed:   - LDL goal <70  -adherence with atorvastatin - patient will fill at [facility] current medications  IPF/ asthma  Patient reports stable breathing.   Eosinophil count:   Lab Results  Component Value Date/Time   EOSPCT 4.0 12/01/2019 02:42 PM  %                               Eos (Absolute):  Lab Results  Component Value Date/Time   EOSABS 0.3 12/01/2019 02:42 PM    Tobacco Status:  Social History   Tobacco Use  Smoking Status Never Smoker  Smokeless Tobacco Never Used   Patient has failed these meds in past: none   Patient is currently controlled on the following medications:   Albuterol HFA 170mcg, act inhaler, inhale 1 puff every six hours as needed   Pirfenidone 801mg , 1 tablet in the morning, at noon, and at bedtime   Using maintenance inhaler regularly? NA - not prescribed   Frequency of rescue inhaler use:   prn - patient reports use of albuterol inhaler less than every week.   Plan Continue current medications   Fluid retention   Patient reports seeing fluid retention in ankles and evident when she has shortness of breath. Currently, she states it is controlled.   Patient is currently controlled on the following medications:  Furosemide 40mg  0.5 tables once daily (for fluid retention).  Plan Continue current medications   Low back pain  Patient stated she is scheduled to get injections for pain; pending insurance approval.  Patient not at home and unable to confirm APAP strength.   Patient has failed these meds in past: meloxicam, naproxen, oxycodone  Patient is currently controlled on the following medications:   Hydrocodone/APAP 10/325mg , 1 tablet every six hours as needed (takes as indicated)  Cyclobenzaprine 5mg ,1 tablet at bedtime   Diclofenac 1% gel, apply 2 g four times daily   APAP 500 mg, 1 tablet as needed for pain   We discussed:  - caution use of APAP from multiple medications (maximum dose of APAP: 3g/day).   Plan Continue current medications  Anxiety   Patient reports taking everyday; at most takes twice daily.   Denies drowsiness. Reports helps her calm down.    Patient has failed these meds in past: none   Patient is currently controlled on the following medications:   Alprazolam 0.25mg ,  1 tablet three times daily as needed for anxiety  We discussed:  caution use of benzodiazepines in older adults  -  discussed other options that are longer duration for anxiety   Plan Continue current medications  Readdress at follow up.   Constipation  Patient has failed these meds in past: bisacodyl, Linzess (previous loose stools)   Patient is currently controlled on the following medications:   docusate 100mg , 1 capsule twice daily  Lactulose solution, take 30 ML three times daily as needed for mild constipation  Miralax, 17g one daily (not a part of  daily regimen)   We discussed:  - tier exception for Linzess (patient deferred to continue current regimen)   Plan Continue current medications  Recommend Senna.    Vaccines   Reviewed and discussed patient's vaccination history.    Immunization History  Administered Date(s) Administered  . Fluad Quad(high Dose 65+) 11/29/2019  . PFIZER SARS-COV-2 Vaccination 04/21/2019, 05/12/2019  . PPD Test 11/19/2010  . Pneumococcal Polysaccharide-23 11/29/2019    Plan  Recommended patient receive shingles and tetanus vaccine in office/at pharmacy.    Medication Management   Pt uses [facility] for all medications Uses pill box? Yes  Pt endorses not missing many doses.  Plan   Utilize [name] for medication synchronization, packaging and delivery  Follow up: 1 week in person visit with CPP for [name] onboarding  [name], PharmD Clinical Pharmacist [facility] [phone]

## 2020-02-24 ENCOUNTER — Other Ambulatory Visit: Payer: Self-pay | Admitting: Internal Medicine

## 2020-02-24 DIAGNOSIS — E785 Hyperlipidemia, unspecified: Secondary | ICD-10-CM

## 2020-02-28 ENCOUNTER — Other Ambulatory Visit: Payer: Self-pay

## 2020-02-28 ENCOUNTER — Other Ambulatory Visit: Payer: Self-pay | Admitting: *Deleted

## 2020-02-28 ENCOUNTER — Ambulatory Visit: Payer: HMO | Admitting: Pharmacist

## 2020-02-28 DIAGNOSIS — E785 Hyperlipidemia, unspecified: Secondary | ICD-10-CM

## 2020-02-28 DIAGNOSIS — G8929 Other chronic pain: Secondary | ICD-10-CM

## 2020-02-28 DIAGNOSIS — M545 Low back pain, unspecified: Secondary | ICD-10-CM

## 2020-02-28 DIAGNOSIS — I10 Essential (primary) hypertension: Secondary | ICD-10-CM

## 2020-02-28 DIAGNOSIS — M19012 Primary osteoarthritis, left shoulder: Secondary | ICD-10-CM

## 2020-02-28 MED ORDER — LACTULOSE 10 GM/15ML PO SOLN
ORAL | 0 refills | Status: DC
Start: 2020-02-28 — End: 2020-08-15

## 2020-02-28 MED ORDER — HYDROCODONE-ACETAMINOPHEN 10-325 MG PO TABS: 1.0000 | ORAL_TABLET | Freq: Four times a day (QID) | ORAL | 0 refills | Status: DC | PRN

## 2020-02-28 MED ORDER — FREESTYLE LIBRE 14 DAY SENSOR MISC
0 refills | Status: DC
Start: 2020-02-28 — End: 2020-04-04

## 2020-02-28 NOTE — Patient Outreach (Signed)
°  [facility]) Care Management Chronic Special Needs Program    02/28/2020  Name: Miranda [name], DOB: 11/19/1945  MRN: [id number]   Ms. Miranda [name] is enrolled in a chronic special needs plan for Heart Failure. Follow up with provider pharmacist completed 02/21/20. [place] Management will continue to provide services for this member through 03/15/2020. The [facility] Care Management Team will assume care 03/16/2020.  Thea Silversmith, RN, MSN, [place] [phone]

## 2020-02-28 NOTE — Telephone Encounter (Signed)
-----   Message from [name], [facility] sent at 02/28/2020 10:45 AM EST ----- Regarding: Refills Hi,  I just spoke with Ms. Miranda [name] and she is requesting a refill for her Freestyle Libre sensors and lactulose and wants to know when she can refill her Norco. I saw that it was sent in for 90 days in September but sometimes the controlled substances prescriptions appear strange in Epic and I wasn't sure if she was due for a refill or not.  Thanks, Maddie

## 2020-02-29 ENCOUNTER — Telehealth: Payer: Self-pay | Admitting: *Deleted

## 2020-02-29 MED ORDER — GLUCOSE BLOOD VI STRP: ORAL_STRIP | 12 refills | Status: DC

## 2020-02-29 NOTE — Telephone Encounter (Signed)
-----   Message from [name], [facility] sent at 02/29/2020 11:57 AM EST ----- Regarding: Test strips refill Hi again,  Ms. Schindel called me again and requested a refill for accu-chek aviva test strips. Can you please send in a refill for those as well?  Thank you, Maddie

## 2020-03-02 ENCOUNTER — Other Ambulatory Visit: Payer: Self-pay | Admitting: Internal Medicine

## 2020-03-11 NOTE — Chronic Care Management (AMB) (Signed)
  Chronic Care Management    Outreach Note     Name: Miranda [name]   MRN: [id number]       DOB: 10-07-1945   Referred by: Philip Aspen, Limmie Patricia, MD  Reason for referral: Chronic care management  Reviewed chart for medication changes ahead of medication coordination call.  BP Readings from Last 3 Encounters:  02/05/20 (!) 147/70  01/07/20 (!) 147/74  12/13/19 118/60    Lab Results  Component Value Date   HGBA1C 6.1 12/01/2019     Verbal consent obtained for [name] enhanced pharmacy services (medication synchronization, adherence packaging, delivery coordination). A medication sync plan was created to allow patient to get all medications delivered once every 30 to 90 days per patient preference. Patient understands they have freedom to choose pharmacy and clinical pharmacist will coordinate care between all prescribers and [name].  Patient requested to obtain medications through Adherence Packaging  30 Days   Med Sync Plan:  Medication Name Last Fill Date & Day Supply Format: MM/DD/YY - DS (If last fill/DS unavailable, list pt.'s quantity on hand) Anticipated next due date  Format: MM/DD/YY      Albuterol HFA PRN  PRN Will call when needed  Alprazolam 0.25 mg tablet - 1 tablet TID PRN #7 tablets PRN Will call when needed  Amlodipine 10 mg -  1 tablet daily #16 03-15-20  Atorvastatin 80 mg tablet -  1 tablet daily #89 tablets 05-27-20  Ezetimibe 10 mg tablet -  1 tablet daily #14  03-13-20  Cyclobenzaprine 5 mg tablet -  1 tablet at bedtime #25  03-24-20  Dronedarone 400 mg tablet twice daily with a meal #32 tablets 03-15-20  Furosemide 40 mg tablet -  1/2 tablet daily #15 tablets  03-30-19  HCTZ 25 mg tablet -  1 tablet daily #84 tablets 05-22-20  Norco 10-325 mg PRN PRN Will call when needed  Metformin 500 mg tablet -  1 tablet daily  #112 (not taking daily) Will call when needed  Losartan 100 mg -  1 tablet daily #54 04-22-20  Pirfenidone 801 mg  tablet -  1 tablet three times daily #53 tablets  03-16-20  Xarelto 20 mg tablet -  1 tablet daily at supper #68  05-06-20    Prescriptions requested from PCP and specialists.   Gaylord Shih, PharmD Clinical Pharmacist [facility] [place] [phone]

## 2020-03-12 ENCOUNTER — Other Ambulatory Visit: Payer: Self-pay

## 2020-03-12 ENCOUNTER — Other Ambulatory Visit: Payer: Self-pay | Admitting: *Deleted

## 2020-03-12 DIAGNOSIS — I480 Paroxysmal atrial fibrillation: Secondary | ICD-10-CM

## 2020-03-12 MED ORDER — ACCU-CHEK AVIVA PLUS VI STRP: ORAL_STRIP | 12 refills | Status: DC

## 2020-03-13 ENCOUNTER — Other Ambulatory Visit: Payer: Self-pay | Admitting: *Deleted

## 2020-03-13 DIAGNOSIS — M545 Low back pain, unspecified: Secondary | ICD-10-CM

## 2020-03-13 DIAGNOSIS — G8929 Other chronic pain: Secondary | ICD-10-CM

## 2020-03-13 DIAGNOSIS — I10 Essential (primary) hypertension: Secondary | ICD-10-CM

## 2020-03-13 MED ORDER — MULTAQ 400 MG PO TABS: ORAL_TABLET | ORAL | 3 refills | Status: DC

## 2020-03-13 NOTE — Telephone Encounter (Signed)
-----   Message from Verner Chol, [facility] sent at 03/12/2020  4:47 PM EST ----- Regarding: Refills Gwenyth Allegra,  Ms. Heizer is trying to switch to [employer], can you please send refills of ezetimibe, amlodipine, cyclobenzaprine and furosemide to the pharmacy?  Thank you, Maddie

## 2020-03-14 MED ORDER — CYCLOBENZAPRINE HCL 5 MG PO TABS: 5.0000 mg | ORAL_TABLET | Freq: Every day | ORAL | 1 refills | Status: DC

## 2020-03-14 MED ORDER — FUROSEMIDE 40 MG PO TABS: ORAL_TABLET | ORAL | 1 refills | Status: DC

## 2020-03-14 MED ORDER — EZETIMIBE 10 MG PO TABS
10.0000 mg | ORAL_TABLET | Freq: Every day | ORAL | 1 refills | Status: DC
Start: 2020-03-14 — End: 2020-05-29

## 2020-03-14 MED ORDER — AMLODIPINE BESYLATE 10 MG PO TABS: 10.0000 mg | ORAL_TABLET | Freq: Every day | ORAL | 1 refills | Status: DC

## 2020-03-19 ENCOUNTER — Ambulatory Visit: Payer: HMO | Admitting: Internal Medicine

## 2020-03-21 ENCOUNTER — Other Ambulatory Visit: Payer: Self-pay | Admitting: Internal Medicine

## 2020-03-21 DIAGNOSIS — M545 Low back pain, unspecified: Secondary | ICD-10-CM

## 2020-03-21 DIAGNOSIS — G8929 Other chronic pain: Secondary | ICD-10-CM

## 2020-03-21 DIAGNOSIS — M25512 Pain in left shoulder: Secondary | ICD-10-CM

## 2020-03-22 ENCOUNTER — Other Ambulatory Visit: Payer: Self-pay

## 2020-04-03 ENCOUNTER — Ambulatory Visit: Payer: HMO | Admitting: Internal Medicine

## 2020-04-04 ENCOUNTER — Telehealth: Payer: Self-pay | Admitting: *Deleted

## 2020-04-04 ENCOUNTER — Ambulatory Visit (INDEPENDENT_AMBULATORY_CARE_PROVIDER_SITE_OTHER): Payer: HMO | Admitting: Internal Medicine

## 2020-04-04 ENCOUNTER — Other Ambulatory Visit: Payer: Self-pay

## 2020-04-04 ENCOUNTER — Other Ambulatory Visit: Payer: Self-pay | Admitting: Internal Medicine

## 2020-04-04 ENCOUNTER — Encounter: Payer: Self-pay | Admitting: Internal Medicine

## 2020-04-04 VITALS — BP 130/80 | HR 64 | Temp 98.2°F | Wt 173.6 lb

## 2020-04-04 DIAGNOSIS — E119 Type 2 diabetes mellitus without complications: Secondary | ICD-10-CM | POA: Diagnosis not present

## 2020-04-04 DIAGNOSIS — E785 Hyperlipidemia, unspecified: Secondary | ICD-10-CM

## 2020-04-04 DIAGNOSIS — M19012 Primary osteoarthritis, left shoulder: Secondary | ICD-10-CM

## 2020-04-04 DIAGNOSIS — M545 Low back pain, unspecified: Secondary | ICD-10-CM | POA: Diagnosis not present

## 2020-04-04 DIAGNOSIS — I480 Paroxysmal atrial fibrillation: Secondary | ICD-10-CM

## 2020-04-04 DIAGNOSIS — N6311 Unspecified lump in the right breast, upper outer quadrant: Secondary | ICD-10-CM | POA: Diagnosis not present

## 2020-04-04 DIAGNOSIS — N6310 Unspecified lump in the right breast, unspecified quadrant: Secondary | ICD-10-CM

## 2020-04-04 DIAGNOSIS — I10 Essential (primary) hypertension: Secondary | ICD-10-CM

## 2020-04-04 DIAGNOSIS — G8929 Other chronic pain: Secondary | ICD-10-CM

## 2020-04-04 MED ORDER — HYDROCODONE-ACETAMINOPHEN 10-325 MG PO TABS: 1.0000 | ORAL_TABLET | Freq: Four times a day (QID) | ORAL | 0 refills | Status: DC | PRN

## 2020-04-04 MED ORDER — ACCU-CHEK SOFTCLIX LANCETS MISC: 12 refills | Status: DC

## 2020-04-04 MED ORDER — FREESTYLE LIBRE 14 DAY SENSOR MISC: 2 refills | Status: DC

## 2020-04-04 MED ORDER — ACCU-CHEK GUIDE VI STRP: ORAL_STRIP | 12 refills | Status: DC

## 2020-04-04 MED ORDER — ATORVASTATIN CALCIUM 80 MG PO TABS: 80.0000 mg | ORAL_TABLET | Freq: Every day | ORAL | 1 refills | Status: DC

## 2020-04-04 MED ORDER — RIVAROXABAN 20 MG PO TABS: ORAL_TABLET | ORAL | 1 refills | Status: DC

## 2020-04-04 MED ORDER — LOSARTAN POTASSIUM 100 MG PO TABS: 100.0000 mg | ORAL_TABLET | Freq: Every day | ORAL | 1 refills | Status: DC

## 2020-04-04 MED ORDER — HYDROCHLOROTHIAZIDE 25 MG PO TABS: 25.0000 mg | ORAL_TABLET | Freq: Every day | ORAL | 1 refills | Status: DC

## 2020-04-04 MED ORDER — FREESTYLE LIBRE 14 DAY READER DEVI: 1.0000 | Freq: Every day | 2 refills | Status: DC

## 2020-04-04 NOTE — Telephone Encounter (Signed)
Refills sent

## 2020-04-04 NOTE — Progress Notes (Signed)
Established Patient Office Visit     This visit occurred during the SARS-CoV-2 public health emergency.  Safety protocols were in place, including screening questions prior to the visit, additional usage of staff PPE, and extensive cleaning of exam room while observing appropriate contact time as indicated for disinfecting solutions.    CC/Reason for Visit: Right breast pain  HPI: Miranda [name] is a 75 y.o. female who is coming in today for the above mentioned reasons.  Her last mammogram was in June 2020 was reported as negative.  She has noticed over the past few weeks a lump in her right breast that is at times painful.  She is concerned as she has had two sisters with breast cancer.  She is requesting refills of her hydrocodone.  Past Medical/Surgical History: Past Medical History:  Diagnosis Date  . Anxiety   . Arthritis   . Asthma   . Atrial fibrillation ([place])   . CHF (congestive heart failure) ([place])    pt. unsure- but thinks she was hosp. for CHF- 2002  . [facility]    [facility]  . Clotting disorder ([place])    blood clots in lungs/PE pulmonary embolism  . Colon polyps   . Complication of anesthesia    states requires a lot med. to put her to sleep   . DDD (degenerative disc disease) 09/17/2011  . Depression    "sometimes "  . DM (diabetes mellitus) ([place])   . Family history of anesthesia complication   . GERD (gastroesophageal reflux disease)   . Glaucoma    bilateral, pt. admits that she is noncompliant to eye gtts.   . Hemorrhoids   . Hyperlipidemia   . Hypertension    had stress, echo- 2006 /w [place], Cardiac Cath, per pt. 2002, echo repeated 2012- wnl   . Low back pain   . Shortness of breath   . Sleep apnea    uses c-pap- q night recently    Past Surgical History:  Procedure Laterality Date  . ABDOMINAL HYSTERECTOMY     ectopic, fibroids  . arm surgery Right   . CARDIAC CATHETERIZATION    . CATARACT EXTRACTION, BILATERAL      cataracts removed bilateral- ?IOL  . COLONOSCOPY     remote  . FISSURECTOMY  10/08/2011   Procedure: FISSURECTOMY;  Surgeon: Stark Klein, MD;  Location: [place];  Service: General;  Laterality: N/A;  . FLEXIBLE SIGMOIDOSCOPY  02/25/2011   Procedure: FLEXIBLE SIGMOIDOSCOPY;  Surgeon: Inda Castle, MD;  Location: [facility];  Service: Endoscopy;  Laterality: N/A;  . FOOT SURGERY     bilat, heel spurs- screw in R foot   . HEMORRHOID SURGERY  10/08/2011   Procedure: HEMORRHOIDECTOMY;  Surgeon: Stark Klein, MD;  Location: [place];  Service: General;  Laterality: N/A;  External   . SPHINCTEROTOMY  10/08/2011   Procedure: Joan Mayans;  Surgeon: Stark Klein, MD;  Location: [place];  Service: General;  Laterality: N/A;    Social History:  reports that she has never smoked. She has never used smokeless tobacco. She reports that she does not drink alcohol and does not use drugs.  Allergies: Allergies  Allergen Reactions  . Fish Oil Anaphylaxis  . Other Hives, Shortness Of Breath and Swelling    Allergic to cashew nuts and peanut oil.  Marland Kitchen Penicillins Anaphylaxis, Hives and Swelling    Has patient had a PCN reaction causing immediate rash, facial/tongue/throat swelling, SOB or lightheadedness with  hypotension: Face swelling and hives started first, then swelling of the throat  Has patient had a PCN reaction causing severe rash involving mucus membranes or skin necrosis: Yes  Has patient had a PCN reaction that required hospitalization: No  Has patient had a PCN reaction occurring within the last 10 years: Yes  If all of the above answers are "NO", then may proceed with Cephalospor  . Pneumococcal Vaccines Nausea And Vomiting  . Cashew Nut Oil   . Nitrofurantoin Macrocrystal Hives  . Peanut Oil   . Aspirin Itching and Rash  . Bactrim [Sulfamethoxazole-Trimethoprim] Hives, Itching and Rash  . Ciprofloxacin Hives, Itching and Rash  . Ibuprofen Rash  . Influenza Vaccines Hives  . Ivp Dye  [Iodinated Diagnostic Agents] Hives, Itching and Rash    Gives benadryl to counteract symptoms  . Latex Rash  . Macrobid [Nitrofurantoin Monohydrate Macrocrystals] Hives  . Shellfish Allergy Hives    Patient also allergic to seafood    Family History:  Family History  Problem Relation Age of Onset  . Heart attack Mother   . Heart disease Mother   . Breast cancer Mother   . Emphysema Sister   . Breast cancer Sister   . Arthritis/Rheumatoid Sister   . Asthma Sister   . Lung cancer Sister   . COPD Sister   . Colon cancer Brother   . Colon cancer Brother   . Anesthesia problems Neg Hx      Current Outpatient Medications:  .  ACCU-CHEK AVIVA PLUS test strip, USE 1 STRIP TO CHECK GLUCOSE ONCE DAILY, Disp: 100 each, Rfl: 12 .  ACETAMINOPHEN PO, Take by mouth as needed (pain)., Disp: , Rfl:  .  albuterol (VENTOLIN HFA) 108 (90 Base) MCG/ACT inhaler, Inhale 1 puff into the lungs every 6 (six) hours as needed., Disp: 18 g, Rfl: 1 .  ALPRAZolam (XANAX) 0.25 MG tablet, Take 1 tablet (0.25 mg total) by mouth 3 (three) times daily as needed for anxiety., Disp: 90 tablet, Rfl: 0 .  amLODipine (NORVASC) 10 MG tablet, Take 1 tablet (10 mg total) by mouth daily., Disp: 90 tablet, Rfl: 1 .  atorvastatin (LIPITOR) 80 MG tablet, Take 1 tablet by mouth once daily, Disp: 90 tablet, Rfl: 0 .  Biotin 5000 MCG CAPS, Take 1 capsule by mouth daily., Disp: , Rfl:  .  Continuous Blood Gluc Receiver (FREESTYLE LIBRE 14 DAY READER) DEVI, 1 each by Does not apply route daily., Disp: 1 each, Rfl: 2 .  Continuous Blood Gluc Sensor (FREESTYLE LIBRE 14 DAY SENSOR) MISC, USE  ONCE DAILY, Disp: 2 each, Rfl: 0 .  cyclobenzaprine (FLEXERIL) 5 MG tablet, TAKE ONE TABLET BY MOUTH EVERYDAY AT BEDTIME, Disp: 71 tablet, Rfl: 1 .  diclofenac sodium (VOLTAREN) 1 % GEL, Apply 2 g topically 4 (four) times daily., Disp: 1 Tube, Rfl: 1 .  dronedarone (MULTAQ) 400 MG tablet, TAKE 1 TABLET BY MOUTH TWICE DAILY WITH A MEAL Filled by  Dr [name], Disp: 180 tablet, Rfl: 3 .  ezetimibe (ZETIA) 10 MG tablet, Take 1 tablet (10 mg total) by mouth daily., Disp: 90 tablet, Rfl: 1 .  furosemide (LASIX) 40 MG tablet, Take 1/2 (one-half) tablet by mouth once daily, Disp: 45 tablet, Rfl: 1 .  glucose blood (ACCU-CHEK AVIVA PLUS) test strip, Use once daily for glucose control.  Dx E11.9  For accu-chek aviva, Disp: 100 each, Rfl: 12 .  hydrochlorothiazide (HYDRODIURIL) 25 MG tablet, Take 1 tablet (25 mg total) by mouth daily.,  Disp: 90 tablet, Rfl: 1 .  Lactobacillus (ACIDOPHILUS) 0.5 MG TABS, Take 1 tablet by mouth daily., Disp: , Rfl:  .  lactulose (CONSTULOSE) 10 GM/15ML solution, TAKE 30 MILLILITERS BY MOUTH  THREE TIMES DAILY AS NEEDED FOR MILD CONSTIPATION, Disp: 473 mL, Rfl: 0 .  lidocaine (LIDODERM) 5 %, Place 1 patch onto the skin daily. Remove & Discard patch within 12 hours or as directed by MD, Disp: 15 patch, Rfl: 0 .  losartan (COZAAR) 100 MG tablet, Take 1 tablet by mouth once daily, Disp: 90 tablet, Rfl: 0 .  metFORMIN (GLUCOPHAGE-XR) 500 MG 24 hr tablet, Take 500 mg by mouth daily with breakfast., Disp: , Rfl:  .  Pirfenidone 801 MG TABS, Take 801 mg by mouth in the morning, at noon, and at bedtime., Disp: , Rfl:  .  polyethylene glycol powder (GLYCOLAX/MIRALAX) powder, Take 17 g by mouth daily., Disp: 3350 g, Rfl: 1 .  rivaroxaban (XARELTO) 20 MG TABS tablet, TAKE 1 TABLET BY MOUTH ONCE DAILY WITH SUPPER, Disp: 90 tablet, Rfl: 1 .  senna (SENOKOT) 8.6 MG TABS tablet, Take 1 tablet by mouth daily as needed for mild constipation., Disp: , Rfl:  .  HYDROcodone-acetaminophen (NORCO) 10-325 MG tablet, Take 1 tablet by mouth every 6 (six) hours as needed., Disp: 90 tablet, Rfl: 0  Review of Systems:  Constitutional: Denies fever, chills, diaphoresis, appetite change and fatigue.  HEENT: Denies photophobia, eye pain, redness, hearing loss, ear pain, congestion, sore throat, rhinorrhea, sneezing, mouth sores, trouble  swallowing, neck pain, neck stiffness and tinnitus.   Respiratory: Denies SOB, DOE, cough, chest tightness,  and wheezing.   Cardiovascular: Denies chest pain, palpitations and leg swelling.  Gastrointestinal: Denies nausea, vomiting, abdominal pain, diarrhea, constipation, blood in stool and abdominal distention.  Genitourinary: Denies dysuria, urgency, frequency, hematuria, flank pain and difficulty urinating.  Endocrine: Denies: hot or cold intolerance, sweats, changes in hair or nails, polyuria, polydipsia. Musculoskeletal: Denies myalgias, back pain, joint swelling, arthralgias and gait problem.  Skin: Denies pallor, rash and wound.  Neurological: Denies dizziness, seizures, syncope, weakness, light-headedness, numbness and headaches.  Hematological: Denies adenopathy. Easy bruising, personal or family bleeding history  Psychiatric/Behavioral: Denies suicidal ideation, mood changes, confusion, nervousness, sleep disturbance and agitation    Physical Exam: Vitals:   04/04/20 1143  BP: 130/80  Pulse: 64  Temp: 98.2 F (36.8 C)  TempSrc: Oral  SpO2: 97%  Weight: 173 lb 9.6 oz (78.7 kg)    Body mass index is 29.34 kg/m.   Constitutional: NAD, calm, comfortable Eyes: PERRL, lids and conjunctivae normal ENMT: Mucous membranes are moist.  Breast: There is is about a 3 to 4 cm oval-shaped mass at the 11 o'clock position, slightly tender to touch, no nipple discharge, no visible skin changes. Neurologic: Intact and nonfocal. Psychiatric: Normal judgment and insight. Alert and oriented x 3. Normal mood.    Impression and Plan:  Breast lump on right side at 11 o'clock position  -We will send her for diagnostic mammogram with ultrasound, possibly further studies depending on breast radiologist.  Primary osteoarthritis of left shoulder Chronic low back pain without sciatica, unspecified back pain laterality  -PDMP reviewed, no red flags, overdose risk score 90. -Refill  hydrocodone 5/325 mg that she takes every 6 hours as needed.  She gets 90 tablets which should last for 90 days.    [name], MD [facility]

## 2020-04-04 NOTE — Telephone Encounter (Signed)
-----   Message from [name], [facility] sent at 04/02/2020 10:19 AM EST ----- Regarding: Refills Hi,  Can you please send refills for Ms. Foulk for the following medications to be sent to [name]?  -Atorvastatin 80 mg -HCTZ 25 mg -Losartan 100 mg -Xarelto 20 mg  Thank you so much! Maddie

## 2020-04-15 ENCOUNTER — Telehealth: Payer: Self-pay | Admitting: Internal Medicine

## 2020-04-15 ENCOUNTER — Other Ambulatory Visit: Payer: Self-pay | Admitting: Pulmonary Disease

## 2020-04-15 NOTE — Progress Notes (Deleted)
Cardiology Office Note    Date:  04/15/2020   ID:  Miranda [name], Miranda [name] 1945-06-29, MRN [id number]  PCP:  [name], [name], MD  Cardiologist:  Nelida Mandarino [name] MD  No chief complaint on file.   History of Present Illness:  Miranda [name] is a 75 y.o. female with PMH of depression, GERD, hypertension, hyperlipidemia, obstructive sleep apnea on CPAP, DM 2 and atrial fibrillation.  Patient was remotely seen by me in the hospital on 08/30/2010 for consultation with regard to chest pain.  Her chest pain was felt to be atypical at the time. Lexiscan myoview obtained on 06/13/2014 showed EF 69%, normal perfusion and normal wall motion.  She was diagnosed with acute PE in February 2018 and was started on a 75-month regimen of Xarelto.  High-resolution CT obtained on 05/08/2016 suggested interstitial lung disease as well along with aortic atherosclerosis and two-vessel coronary artery disease.  Echocardiogram obtained on 05/02/2016 showed EF 60 to 65%, grade 1 DD, moderately dilated left atrium.  Repeat CT angiogram of the chest in July 2019 showed resolution of PE.  She was seen in the ED on 09/14/2017 after she was sent over from gastroenterology.  She apparently was undergoing colonoscopy when the staff found her heart rate to be fast and irregular.  She was found to be in new atrial fibrillation was rapid ventricular rate.  She spontaneously converted in the emergency room.  TSH was normal.  Troponin negative x2.  Patient was started on Xarelto and metoprolol. Echo showed normal LV and valvular function. Mild atrial enlargement. Myoview study was normal. She was seen by Dr. Curt Bears and started on Multaq. CT scan showed mild pulmonary fibrosis and some calcification in the LAD and LCx.   On follow up today she does note she gets tired and has some DOE. No cough. No chest pain. Occasionally feels lightheaded. She has lost weight. Feels AFib has improved on Multaq.     Past Medical History:   Diagnosis Date  . Anxiety   . Arthritis   . Asthma   . Atrial fibrillation ([place])   . CHF (congestive heart failure) ([place])    pt. unsure- but thinks she was hosp. for CHF- 2002  . [facility] disease    [facility]  . Clotting disorder ([place])    blood clots in lungs/PE pulmonary embolism  . Colon polyps   . Complication of anesthesia    states requires a lot med. to put her to sleep   . DDD (degenerative disc disease) 09/17/2011  . Depression    "sometimes "  . DM (diabetes mellitus) ([place])   . Family history of anesthesia complication   . GERD (gastroesophageal reflux disease)   . Glaucoma    bilateral, pt. admits that she is noncompliant to eye gtts.   . Hemorrhoids   . Hyperlipidemia   . Hypertension    had stress, echo- 2006 /w [place], Cardiac Cath, per pt. 2002, echo repeated 2012- wnl   . Low back pain   . Shortness of breath   . Sleep apnea    uses c-pap- q night recently    Past Surgical History:  Procedure Laterality Date  . ABDOMINAL HYSTERECTOMY     ectopic, fibroids  . arm surgery Right   . CARDIAC CATHETERIZATION    . CATARACT EXTRACTION, BILATERAL     cataracts removed bilateral- ?IOL  . COLONOSCOPY     remote  . FISSURECTOMY  10/08/2011   Procedure:  FISSURECTOMY;  Surgeon: Stark Klein, MD;  Location: [facility] OR;  Service: General;  Laterality: N/A;  . FLEXIBLE SIGMOIDOSCOPY  02/25/2011   Procedure: Beryle Quant;  Surgeon: Inda Castle, MD;  Location: [facility] ENDOSCOPY;  Service: Endoscopy;  Laterality: N/A;  . FOOT SURGERY     bilat, heel spurs- screw in R foot   . HEMORRHOID SURGERY  10/08/2011   Procedure: HEMORRHOIDECTOMY;  Surgeon: Stark Klein, MD;  Location: [place];  Service: General;  Laterality: N/A;  External   . SPHINCTEROTOMY  10/08/2011   Procedure: Joan Mayans;  Surgeon: Stark Klein, MD;  Location: [facility] OR;  Service: General;  Laterality: N/A;    Current Medications: Outpatient Medications Prior to Visit  Medication Sig  Dispense Refill  . Accu-Chek Softclix Lancets lancets Use as instructed 100 each 12  . ACETAMINOPHEN PO Take by mouth as needed (pain).    Marland Kitchen albuterol (VENTOLIN HFA) 108 (90 Base) MCG/ACT inhaler Inhale 1 puff into the lungs every 6 (six) hours as needed. 18 g 1  . ALPRAZolam (XANAX) 0.25 MG tablet Take 1 tablet (0.25 mg total) by mouth 3 (three) times daily as needed for anxiety. 90 tablet 0  . amLODipine (NORVASC) 10 MG tablet Take 1 tablet (10 mg total) by mouth daily. 90 tablet 1  . atorvastatin (LIPITOR) 80 MG tablet Take 1 tablet (80 mg total) by mouth daily. 90 tablet 1  . Biotin 5000 MCG CAPS Take 1 capsule by mouth daily.    . Continuous Blood Gluc Receiver (FREESTYLE LIBRE 14 DAY READER) DEVI 1 each by Does not apply route daily. 1 each 2  . Continuous Blood Gluc Sensor (FREESTYLE LIBRE 14 DAY SENSOR) MISC USE  ONCE DAILY 2 each 2  . cyclobenzaprine (FLEXERIL) 5 MG tablet TAKE ONE TABLET BY MOUTH EVERYDAY AT BEDTIME 71 tablet 1  . diclofenac sodium (VOLTAREN) 1 % GEL Apply 2 g topically 4 (four) times daily. 1 Tube 1  . dronedarone (MULTAQ) 400 MG tablet TAKE 1 TABLET BY MOUTH TWICE DAILY WITH A MEAL Filled by Dr Lamoine Magallon [name] 180 tablet 3  . ezetimibe (ZETIA) 10 MG tablet Take 1 tablet (10 mg total) by mouth daily. 90 tablet 1  . furosemide (LASIX) 40 MG tablet Take 1/2 (one-half) tablet by mouth once daily 45 tablet 1  . glucose blood (ACCU-CHEK AVIVA PLUS) test strip Use once daily for glucose control.  Dx E11.9  For accu-chek aviva 100 each 12  . glucose blood (ACCU-CHEK GUIDE) test strip Use as instructed 100 each 12  . hydrochlorothiazide (HYDRODIURIL) 25 MG tablet Take 1 tablet (25 mg total) by mouth daily. 90 tablet 1  . HYDROcodone-acetaminophen (NORCO) 10-325 MG tablet Take 1 tablet by mouth every 6 (six) hours as needed. 90 tablet 0  . Lactobacillus (ACIDOPHILUS) 0.5 MG TABS Take 1 tablet by mouth daily.    Marland Kitchen lactulose (CONSTULOSE) 10 GM/15ML solution TAKE 30 MILLILITERS BY  MOUTH  THREE TIMES DAILY AS NEEDED FOR MILD CONSTIPATION 473 mL 0  . lidocaine (LIDODERM) 5 % Place 1 patch onto the skin daily. Remove & Discard patch within 12 hours or as directed by MD 15 patch 0  . losartan (COZAAR) 100 MG tablet Take 1 tablet (100 mg total) by mouth daily. 90 tablet 1  . metFORMIN (GLUCOPHAGE-XR) 500 MG 24 hr tablet Take 500 mg by mouth daily with breakfast.    . Pirfenidone 801 MG TABS Take 801 mg by mouth in the morning, at noon, and at bedtime.    Marland Kitchen  polyethylene glycol powder (GLYCOLAX/MIRALAX) powder Take 17 g by mouth daily. 3350 g 1  . rivaroxaban (XARELTO) 20 MG TABS tablet TAKE 1 TABLET BY MOUTH ONCE DAILY WITH SUPPER 90 tablet 1  . senna (SENOKOT) 8.6 MG TABS tablet Take 1 tablet by mouth daily as needed for mild constipation.     No facility-administered medications prior to visit.     Allergies:   Fish oil, Other, Penicillins, Pneumococcal vaccines, Cashew nut oil, Nitrofurantoin macrocrystal, Peanut oil, Aspirin, Bactrim [sulfamethoxazole-trimethoprim], Ciprofloxacin, Ibuprofen, Influenza vaccines, Ivp dye [iodinated diagnostic agents], Latex, Macrobid [nitrofurantoin monohydrate macrocrystals], and Shellfish allergy   Social History   Socioeconomic History  . Marital status: Married    Spouse name: Not on file  . Number of children: 4  . Years of education: Not on file  . Highest education level: Not on file  Occupational History  . Occupation: retired Forensic psychologist: UNEMPLOYED  Tobacco Use  . Smoking status: Never Smoker  . Smokeless tobacco: Never Used  Vaping Use  . Vaping Use: Never used  Substance and Sexual Activity  . Alcohol use: No    Alcohol/week: 0.0 standard drinks  . Drug use: No  . Sexual activity: Never  Other Topics Concern  . Not on file  Social History Narrative  . Not on file   Social Determinants of Health   Financial Resource Strain: Low Risk   . Difficulty of Paying Living Expenses: Not hard at all  Food  Insecurity: Not on file  Transportation Needs: No Transportation Needs  . Lack of Transportation (Medical): No  . Lack of Transportation (Non-Medical): No  Physical Activity: Not on file  Stress: Not on file  Social Connections: Not on file     Family History:  The patient's family history includes Arthritis/Rheumatoid in her sister; Asthma in her sister; Breast cancer in her mother and sister; COPD in her sister; Colon cancer in her brother and brother; Emphysema in her sister; Heart attack in her mother; Heart disease in her mother; Lung cancer in her sister.   ROS:   Please see the history of present illness.    ROS All other systems reviewed and are negative.   PHYSICAL EXAM:   VS:  There were no vitals taken for this visit.   GEN: Well nourished, well developed, in no acute distress  HEENT: normal  Neck: no JVD, carotid bruits, or masses Cardiac: RRR; no rubs, or gallops,no edema  2/6 murmur Respiratory:  clear to auscultation bilaterally, normal work of breathing GI: soft, nontender, nondistended, + BS MS: no deformity or atrophy  Skin: warm and dry, no rash Neuro:  Alert and Oriented x 3, Strength and sensation are intact Psych: euthymic mood, full affect  Wt Readings from Last 3 Encounters:  04/04/20 173 lb 9.6 oz (78.7 kg)  12/13/19 176 lb (79.8 kg)  11/29/19 176 lb 11.2 oz (80.2 kg)      Studies/Labs Reviewed:   EKG:  EKG is not ordered today.    Recent Labs: 10/12/2019: Pro B Natriuretic peptide (BNP) 51.0 12/01/2019: ALT 18; TSH 1.45 02/05/2020: BUN 16; Creatinine, Ser 0.99; Hemoglobin 13.2; Platelets 201; Potassium 3.2; Sodium 138   Lipid Panel    Component Value Date/Time   CHOL 171 12/01/2019 1025   CHOL 167 11/26/2017 0908   TRIG 70 12/01/2019 1025   HDL 65 12/01/2019 1025   HDL 61 11/26/2017 0908   CHOLHDL 2.6 12/01/2019 1025   VLDL 16.8 06/01/2014 1145  [place] 90 12/01/2019 1025   LDLDIRECT 152.8 03/30/2011 0859    Additional studies/  records that were reviewed today include:   Myoview 06/13/2014 Impression Exercise Capacity:  Lexiscan with no exercise. BP Response:  Hypertensive blood pressure response. Clinical Symptoms:  Headache/abdominal pain ECG Impression:  No significant ST segment change suggestive of ischemia. Comparison with Prior Nuclear Study: No images to compare  Overall Impression:  Normal stress nuclear study.  LV Ejection Fraction: 69%.  LV Wall Motion:  NL LV Function; NL Wall Motion      Echo 05/02/2016 LV EF: 60% -   65% Study Conclusions  - Left ventricle: The cavity size was normal. There was severe   concentric hypertrophy. Systolic function was normal. The   estimated ejection fraction was in the range of 60% to 65%.   Doppler parameters are consistent with abnormal left ventricular   relaxation (grade 1 diastolic dysfunction). - Left atrium: The atrium was moderately dilated. - Atrial septum: No defect or patent foramen ovale was identified.     CT of chest 05/08/2016 IMPRESSION: 1. The appearance of the lungs remains compatible with interstitial lung disease. The overall findings remain most suggestive of usual interstitial pneumonia (UIP), but appears centrally unchanged compared to prior study from 06/24/2015. 2. Aortic atherosclerosis, in addition to 2 vessel coronary artery disease. Please note that although the presence of coronary artery calcium documents the presence of coronary artery disease, the severity of this disease and any potential stenosis cannot be assessed on this non-gated CT examination. Assessment for potential risk factor modification, dietary therapy or pharmacologic therapy may be warranted, if clinically indicated.  Echo 10/19/17: Study Conclusions  - Left ventricle: The cavity size was normal. Systolic function was   normal. The estimated ejection fraction was in the range of 60%   to 65%. Wall motion was normal; there were no regional wall    motion abnormalities. Doppler parameters are consistent with   abnormal left ventricular relaxation (grade 1 diastolic   dysfunction). - Aortic valve: There was no significant regurgitation. - Mitral valve: There was trivial regurgitation. - Left atrium: The atrium was mildly dilated. - Right ventricle: The cavity size was mildly dilated. Wall   thickness was normal. - Tricuspid valve: There was trivial regurgitation. - Pulmonic valve: There was trivial regurgitation. - Inferior vena cava: The vessel was normal in size. The   respirophasic diameter changes were in the normal range (>= 50%),   consistent with normal central venous pressure. one Impressions:  - Normal LV systolic function. No significant valvular disease.  Myoview 11/12/17:Study Highlights    Nuclear stress EF: 66%.  There was no ST segment deviation noted during stress.  No T wave inversion was noted during stress.  The study is normal.  This is a low risk study.  The left ventricular ejection fraction is hyperdynamic (>65%).    Holter monitor 03/17/18: Study Highlights  Minimum HR: 34 BPM at 6:44:52 AM(2) Maximum HR: 91 BPM at 11:52:10 AM(2) Average HR: 59 BPM Rare PACs and PVCs Zero atrial fibrillation Sinus rhythm with occasional atrial and ventricular ectopy Bradycardia with HR in the 30s between 6-7AM Sitting with arm pain and walking with fatigue associated with sinus rhythm  Will [name], MD   ASSESSMENT:    No diagnosis found.   PLAN:  In order of problems listed above:  1. Paroxysmal atrial fibrillation: CHA2DS-Vasc score 4 (age, female, HTN and DM II).  Now on Xarelto for anticoagulation.    Now  on Multaq with good control.   2. Mild pulmonary fibrosis. Followed by pulmonary  3. Hypertension: controlled.  4. Hyperlipidemia: On Lipitor 20 mg daily.  LDL 90.  5. DM2: On metformin.  Recent hemoglobin A1c obtained on 09/22/2017 was 5.9.  6.   CAD with coronary calcification noted  on CT but normal Myoview. Risk factor modification.  I will follow up in one year. Keep follow up with Dr. Curt Bears for her Afib.     Medication Adjustments/Labs and Tests Ordered: Current medicines are reviewed at length with the patient today.  Concerns regarding medicines are outlined above.  Medication changes, Labs and Tests ordered today are listed in the Patient Instructions below. There are no Patient Instructions on file for this visit.   Signed, Murray Durrell [name], MD  04/15/2020 7:32 AM    [facility] [place], [place], [place]  [address] Phone: [phone]; Fax: [phone]

## 2020-04-15 NOTE — Telephone Encounter (Signed)
Pt would like a called from rachel she has question about where her refill should go   need a refill for Continuous Blood Gluc Receiver (FREESTYLE LIBRE 14 DAY READER) DEVI  Continuous Blood Gluc Sensor (FREESTYLE LIBRE [place]) [place] ([place]), [place]  Phone:  [phone] Fax:  [phone]

## 2020-04-16 ENCOUNTER — Other Ambulatory Visit: Payer: Self-pay | Admitting: Internal Medicine

## 2020-04-16 NOTE — Telephone Encounter (Signed)
Attempted to call patient, but unable to leave a message.  Rx have been sent to [name].  Would patient like for it to go to [facility]?

## 2020-04-16 NOTE — Telephone Encounter (Signed)
Patient is aware to call [facility]

## 2020-04-16 NOTE — Progress Notes (Unsigned)
Cardiology Office Note    Date:  04/19/2020   ID:  Miranda [name], Miranda [name] November 07, 1945, MRN [id number]  PCP:  [name], [name], MD  Cardiologist:  [name] MD  Chief Complaint  Patient presents with  . Atrial Fibrillation    History of Present Illness:  Miranda [name] is a 75 y.o. female with PMH of depression, GERD, hypertension, hyperlipidemia, obstructive sleep apnea on CPAP, DM 2 and atrial fibrillation.  Patient was remotely seen by me in the hospital on 08/30/2010 for consultation with regard to chest pain.  Her chest pain was felt to be atypical at the time. Lexiscan myoview obtained on 06/13/2014 showed EF 69%, normal perfusion and normal wall motion.  She was diagnosed with acute PE in February 2018 and was started on a 39-month regimen of Xarelto.  High-resolution CT obtained on 05/08/2016 suggested interstitial lung disease as well along with aortic atherosclerosis and two-vessel coronary artery disease.  Echocardiogram obtained on 05/02/2016 showed EF 60 to 65%, grade 1 DD, moderately dilated left atrium.  Repeat CT angiogram of the chest in July 2019 showed resolution of PE.  She was seen in the ED on 09/14/2017 after she was sent over from gastroenterology.  She apparently was undergoing colonoscopy when the staff found her heart rate to be fast and irregular.  She was found to be in new atrial fibrillation was rapid ventricular rate.  She spontaneously converted in the emergency room.  TSH was normal.  Troponin negative x2.  Patient was started on Xarelto and metoprolol. Echo showed normal LV and valvular function. Mild atrial enlargement. Myoview study was normal. She was seen by Dr. Curt Bears and started on Multaq. CT scan showed mild pulmonary fibrosis and some calcification in the LAD and LCx.   On follow up today she does note she is doing very well. Has few episodes of fluttering in her chest lasting only a minute. Tolerating medication well. Notes some SOB if she walks a  lot. No chest pain.    Past Medical History:  Diagnosis Date  . Anxiety   . Arthritis   . Asthma   . Atrial fibrillation ([place])   . CHF (congestive heart failure) ([place])    pt. unsure- but thinks she was hosp. for CHF- 2002  . [facility] kidney disease    [facility]  . Clotting disorder ([place])    blood clots in lungs/PE pulmonary embolism  . Colon polyps   . Complication of anesthesia    states requires a lot med. to put her to sleep   . DDD (degenerative disc disease) 09/17/2011  . Depression    "sometimes "  . DM (diabetes mellitus) ([place])   . Family history of anesthesia complication   . GERD (gastroesophageal reflux disease)   . Glaucoma    bilateral, pt. admits that she is noncompliant to eye gtts.   . Hemorrhoids   . Hyperlipidemia   . Hypertension    had stress, echo- 2006 /w [place], Cardiac Cath, per pt. 2002, echo repeated 2012- wnl   . Low back pain   . Shortness of breath   . Sleep apnea    uses c-pap- q night recently    Past Surgical History:  Procedure Laterality Date  . ABDOMINAL HYSTERECTOMY     ectopic, fibroids  . arm surgery Right   . CARDIAC CATHETERIZATION    . CATARACT EXTRACTION, BILATERAL     cataracts removed bilateral- ?IOL  . COLONOSCOPY  remote  . FISSURECTOMY  10/08/2011   Procedure: FISSURECTOMY;  Surgeon: Stark Klein, MD;  Location: [facility];  Service: General;  Laterality: N/A;  . FLEXIBLE SIGMOIDOSCOPY  02/25/2011   Procedure: FLEXIBLE SIGMOIDOSCOPY;  Surgeon: Inda Castle, MD;  Location: [facility];  Service: Endoscopy;  Laterality: N/A;  . FOOT SURGERY     bilat, heel spurs- screw in R foot   . HEMORRHOID SURGERY  10/08/2011   Procedure: HEMORRHOIDECTOMY;  Surgeon: Stark Klein, MD;  Location: [place];  Service: General;  Laterality: N/A;  External   . SPHINCTEROTOMY  10/08/2011   Procedure: Joan Mayans;  Surgeon: Stark Klein, MD;  Location: [facility];  Service: General;  Laterality: N/A;    Current  Medications: Outpatient Medications Prior to Visit  Medication Sig Dispense Refill  . Accu-Chek Softclix Lancets lancets Use as instructed 100 each 12  . ACETAMINOPHEN PO Take by mouth as needed (pain).    Marland Kitchen albuterol (VENTOLIN HFA) 108 (90 Base) MCG/ACT inhaler Inhale 1 puff into the lungs every 6 (six) hours as needed. 18 g 1  . ALPRAZolam (XANAX) 0.25 MG tablet Take 1 tablet (0.25 mg total) by mouth 3 (three) times daily as needed for anxiety. 90 tablet 0  . amLODipine (NORVASC) 10 MG tablet Take 1 tablet (10 mg total) by mouth daily. 90 tablet 1  . atorvastatin (LIPITOR) 80 MG tablet Take 1 tablet (80 mg total) by mouth daily. 90 tablet 1  . Biotin 5000 MCG CAPS Take 1 capsule by mouth daily.    . Continuous Blood Gluc Receiver (FREESTYLE LIBRE 14 DAY READER) DEVI 1 each by Does not apply route daily. 1 each 2  . Continuous Blood Gluc Sensor (FREESTYLE LIBRE 14 DAY SENSOR) MISC USE 1  ONCE DAILY 2 each 0  . cyclobenzaprine (FLEXERIL) 5 MG tablet TAKE ONE TABLET BY MOUTH EVERYDAY AT BEDTIME 71 tablet 1  . diclofenac sodium (VOLTAREN) 1 % GEL Apply 2 g topically 4 (four) times daily. 1 Tube 1  . dronedarone (MULTAQ) 400 MG tablet TAKE 1 TABLET BY MOUTH TWICE DAILY WITH A MEAL Filled by Dr [name] 180 tablet 3  . ESBRIET 801 MG TABS TAKE 1 TABLET BY MOUTH 3 TIMES A DAY. 90 tablet 10  . ezetimibe (ZETIA) 10 MG tablet Take 1 tablet (10 mg total) by mouth daily. 90 tablet 1  . furosemide (LASIX) 40 MG tablet Take 1/2 (one-half) tablet by mouth once daily 45 tablet 1  . glucose blood (ACCU-CHEK AVIVA PLUS) test strip Use once daily for glucose control.  Dx E11.9  For accu-chek aviva 100 each 12  . glucose blood (ACCU-CHEK GUIDE) test strip Use as instructed 100 each 12  . hydrochlorothiazide (HYDRODIURIL) 25 MG tablet Take 1 tablet (25 mg total) by mouth daily. 90 tablet 1  . HYDROcodone-acetaminophen (NORCO) 10-325 MG tablet Take 1 tablet by mouth every 6 (six) hours as needed. 90 tablet 0   . Lactobacillus (ACIDOPHILUS) 0.5 MG TABS Take 1 tablet by mouth daily.    Marland Kitchen lactulose (CONSTULOSE) 10 GM/15ML solution TAKE 30 MILLILITERS BY MOUTH  THREE TIMES DAILY AS NEEDED FOR MILD CONSTIPATION 473 mL 0  . lidocaine (LIDODERM) 5 % Place 1 patch onto the skin daily. Remove & Discard patch within 12 hours or as directed by MD 15 patch 0  . losartan (COZAAR) 100 MG tablet Take 1 tablet (100 mg total) by mouth daily. 90 tablet 1  . metFORMIN (GLUCOPHAGE-XR) 500 MG 24 hr tablet Take 500  mg by mouth daily with breakfast.    . polyethylene glycol powder (GLYCOLAX/MIRALAX) powder Take 17 g by mouth daily. 3350 g 1  . rivaroxaban (XARELTO) 20 MG TABS tablet TAKE 1 TABLET BY MOUTH ONCE DAILY WITH SUPPER 90 tablet 1  . senna (SENOKOT) 8.6 MG TABS tablet Take 1 tablet by mouth daily as needed for mild constipation.     No facility-administered medications prior to visit.     Allergies:   Fish oil, Other, Penicillins, Pneumococcal vaccines, Cashew nut oil, Nitrofurantoin macrocrystal, Peanut oil, Aspirin, Bactrim [sulfamethoxazole-trimethoprim], Ciprofloxacin, Ibuprofen, Influenza vaccines, Ivp dye [iodinated diagnostic agents], Latex, Macrobid [nitrofurantoin monohydrate macrocrystals], and Shellfish allergy   Social History   Socioeconomic History  . Marital status: Married    Spouse name: Not on file  . Number of children: 4  . Years of education: Not on file  . Highest education level: Not on file  Occupational History  . Occupation: retired Forensic psychologist: UNEMPLOYED  Tobacco Use  . Smoking status: Never Smoker  . Smokeless tobacco: Never Used  Vaping Use  . Vaping Use: Never used  Substance and Sexual Activity  . Alcohol use: No    Alcohol/week: 0.0 standard drinks  . Drug use: No  . Sexual activity: Never  Other Topics Concern  . Not on file  Social History Narrative  . Not on file   Social Determinants of Health   Financial Resource Strain: Low Risk   . Difficulty of  Paying Living Expenses: Not hard at all  Food Insecurity: Not on file  Transportation Needs: No Transportation Needs  . Lack of Transportation (Medical): No  . Lack of Transportation (Non-Medical): No  Physical Activity: Not on file  Stress: Not on file  Social Connections: Not on file     Family History:  The patient's family history includes Arthritis/Rheumatoid in her sister; Asthma in her sister; Breast cancer in her mother and sister; COPD in her sister; Colon cancer in her brother and brother; Emphysema in her sister; Heart attack in her mother; Heart disease in her mother; Lung cancer in her sister.   ROS:   Please see the history of present illness.    ROS All other systems reviewed and are negative.   PHYSICAL EXAM:   VS:  BP 136/62   Pulse 65   Ht 5\' 5"  (1.651 m)   Wt 172 lb (78 kg)   BMI 28.62 kg/m    GEN: Well nourished, well developed, in no acute distress  HEENT: normal  Neck: no JVD, carotid bruits, or masses Cardiac: RRR; no rubs, or gallops,no edema  2/6 murmur Respiratory:  clear to auscultation bilaterally, normal work of breathing GI: soft, nontender, nondistended, + BS MS: no deformity or atrophy  Skin: warm and dry, no rash Neuro:  Alert and Oriented x 3, Strength and sensation are intact Psych: euthymic mood, full affect  Wt Readings from Last 3 Encounters:  04/19/20 172 lb (78 kg)  04/04/20 173 lb 9.6 oz (78.7 kg)  12/13/19 176 lb (79.8 kg)      Studies/Labs Reviewed:   EKG:  EKG is  ordered today.  NSR rate 65. Normal Ecg. QTc 403 msec. I have personally reviewed and interpreted this study.   Recent Labs: 10/12/2019: Pro B Natriuretic peptide (BNP) 51.0 12/01/2019: ALT 18; TSH 1.45 02/05/2020: BUN 16; Creatinine, Ser 0.99; Hemoglobin 13.2; Platelets 201; Potassium 3.2; Sodium 138   Lipid Panel    Component Value Date/Time  CHOL 171 12/01/2019 1025   CHOL 167 11/26/2017 0908   TRIG 70 12/01/2019 1025   HDL 65 12/01/2019 1025   HDL 61  11/26/2017 0908   CHOLHDL 2.6 12/01/2019 1025   VLDL 16.8 06/01/2014 1145   LDLCALC 90 12/01/2019 1025   LDLDIRECT 152.8 03/30/2011 0859    Additional studies/ records that were reviewed today include:   Myoview 06/13/2014 Impression Exercise Capacity:  Lexiscan with no exercise. BP Response:  Hypertensive blood pressure response. Clinical Symptoms:  Headache/abdominal pain ECG Impression:  No significant ST segment change suggestive of ischemia. Comparison with Prior Nuclear Study: No images to compare  Overall Impression:  Normal stress nuclear study.  LV Ejection Fraction: 69%.  LV Wall Motion:  NL LV Function; NL Wall Motion      Echo 05/02/2016 LV EF: 60% -   65% Study Conclusions  - Left ventricle: The cavity size was normal. There was severe   concentric hypertrophy. Systolic function was normal. The   estimated ejection fraction was in the range of 60% to 65%.   Doppler parameters are consistent with abnormal left ventricular   relaxation (grade 1 diastolic dysfunction). - Left atrium: The atrium was moderately dilated. - Atrial septum: No defect or patent foramen ovale was identified.     CT of chest 05/08/2016 IMPRESSION: 1. The appearance of the lungs remains compatible with interstitial lung disease. The overall findings remain most suggestive of usual interstitial pneumonia (UIP), but appears centrally unchanged compared to prior study from 06/24/2015. 2. Aortic atherosclerosis, in addition to 2 vessel coronary artery disease. Please note that although the presence of coronary artery calcium documents the presence of coronary artery disease, the severity of this disease and any potential stenosis cannot be assessed on this non-gated CT examination. Assessment for potential risk factor modification, dietary therapy or pharmacologic therapy may be warranted, if clinically indicated.  Echo 10/19/17: Study Conclusions  - Left ventricle: The cavity size  was normal. Systolic function was   normal. The estimated ejection fraction was in the range of 60%   to 65%. Wall motion was normal; there were no regional wall   motion abnormalities. Doppler parameters are consistent with   abnormal left ventricular relaxation (grade 1 diastolic   dysfunction). - Aortic valve: There was no significant regurgitation. - Mitral valve: There was trivial regurgitation. - Left atrium: The atrium was mildly dilated. - Right ventricle: The cavity size was mildly dilated. Wall   thickness was normal. - Tricuspid valve: There was trivial regurgitation. - Pulmonic valve: There was trivial regurgitation. - Inferior vena cava: The vessel was normal in size. The   respirophasic diameter changes were in the normal range (>= 50%),   consistent with normal central venous pressure. one Impressions:  - Normal LV systolic function. No significant valvular disease.  Myoview 11/12/17:Study Highlights    Nuclear stress EF: 66%.  There was no ST segment deviation noted during stress.  No T wave inversion was noted during stress.  The study is normal.  This is a low risk study.  The left ventricular ejection fraction is hyperdynamic (>65%).    Holter monitor 03/17/18: Study Highlights  Minimum HR: 34 BPM at 6:44:52 AM(2) Maximum HR: 91 BPM at 11:52:10 AM(2) Average HR: 59 BPM Rare PACs and PVCs Zero atrial fibrillation Sinus rhythm with occasional atrial and ventricular ectopy Bradycardia with HR in the 30s between 6-7AM Sitting with arm pain and walking with fatigue associated with sinus rhythm  Will [name], MD  ASSESSMENT:    1. Paroxysmal atrial fibrillation (HCC)   2. Coronary artery disease involving native coronary artery of native heart without angina pectoris   3. Hyperlipidemia LDL goal <100   4. Essential hypertension      PLAN:  In order of problems listed above:  1. Paroxysmal atrial fibrillation: CHA2DS-Vasc score 4 (age,  female, HTN and DM II).  Now on Xarelto for anticoagulation. Now on Multaq with excellent control. Reinforced that she needs to take this with food.   2. Mild pulmonary fibrosis. Followed by pulmonary  3. Hypertension: controlled.  4. Hyperlipidemia: On Lipitor 20 mg daily.  LDL 90.  5. DM2: On metformin.  6.   CAD with coronary calcification noted on CT but normal Myoview. Risk factor modification.  I will follow up in one year. Keep follow up with Dr. Curt Bears for her Afib.     Medication Adjustments/Labs and Tests Ordered: Current medicines are reviewed at length with the patient today.  Concerns regarding medicines are outlined above.  Medication changes, Labs and Tests ordered today are listed in the Patient Instructions below. There are no Patient Instructions on file for this visit.   Signed, [name], MD  04/19/2020 11:52 AM    [facility] [place], [place], [place]  [address] Phone: [phone]; Fax: [phone]

## 2020-04-18 ENCOUNTER — Ambulatory Visit: Payer: HMO | Admitting: Cardiology

## 2020-04-18 ENCOUNTER — Other Ambulatory Visit: Payer: Self-pay

## 2020-04-18 ENCOUNTER — Ambulatory Visit
Admission: RE | Admit: 2020-04-18 | Discharge: 2020-04-18 | Disposition: A | Discharge status: Home / Self Care | Payer: HMO | Source: Ambulatory Visit | Attending: Internal Medicine | Admitting: Internal Medicine

## 2020-04-18 DIAGNOSIS — N6311 Unspecified lump in the right breast, upper outer quadrant: Secondary | ICD-10-CM

## 2020-04-18 DIAGNOSIS — N6310 Unspecified lump in the right breast, unspecified quadrant: Secondary | ICD-10-CM

## 2020-04-18 DIAGNOSIS — R922 Inconclusive mammogram: Secondary | ICD-10-CM | POA: Diagnosis not present

## 2020-04-18 DIAGNOSIS — N6489 Other specified disorders of breast: Secondary | ICD-10-CM | POA: Diagnosis not present

## 2020-04-19 ENCOUNTER — Ambulatory Visit: Payer: HMO | Admitting: Cardiology

## 2020-04-19 ENCOUNTER — Encounter: Payer: Self-pay | Admitting: Cardiology

## 2020-04-19 VITALS — BP 136/62 | HR 65 | Ht 65.0 in | Wt 172.0 lb

## 2020-04-19 DIAGNOSIS — I10 Essential (primary) hypertension: Secondary | ICD-10-CM | POA: Diagnosis not present

## 2020-04-19 DIAGNOSIS — E785 Hyperlipidemia, unspecified: Secondary | ICD-10-CM

## 2020-04-19 DIAGNOSIS — I2510 Atherosclerotic heart disease of native coronary artery without angina pectoris: Secondary | ICD-10-CM

## 2020-04-19 DIAGNOSIS — I480 Paroxysmal atrial fibrillation: Secondary | ICD-10-CM

## 2020-04-22 ENCOUNTER — Telehealth: Payer: Self-pay | Admitting: Pharmacist

## 2020-04-22 NOTE — Chronic Care Management (AMB) (Signed)
Chronic Care Management Pharmacy Assistant   Name: Miranda [name]  MRN: [id number] DOB: October 20, 1945  Reason for Encounter: Disease State  PCP : [name], [name], MD  Allergies:   Allergies  Allergen Reactions   Fish Oil Anaphylaxis   Other Hives, Shortness Of Breath and Swelling    Allergic to cashew nuts and peanut oil.   Penicillins Anaphylaxis, Hives and Swelling    Has patient had a PCN reaction causing immediate rash, facial/tongue/throat swelling, SOB or lightheadedness with hypotension: Face swelling and hives started first, then swelling of the throat  Has patient had a PCN reaction causing severe rash involving mucus membranes or skin necrosis: Yes  Has patient had a PCN reaction that required hospitalization: No  Has patient had a PCN reaction occurring within the last 10 years: Yes  If all of the above answers are "NO", then may proceed with Cephalospor   Pneumococcal Vaccines Nausea And Vomiting   Cashew Nut Oil    Nitrofurantoin Macrocrystal Hives   Peanut Oil    Aspirin Itching and Rash   Bactrim [Sulfamethoxazole-Trimethoprim] Hives, Itching and Rash   Ciprofloxacin Hives, Itching and Rash   Ibuprofen Rash   Influenza Vaccines Hives   Ivp Dye [Iodinated Diagnostic Agents] Hives, Itching and Rash    Gives benadryl to counteract symptoms   Latex Rash   Macrobid [Nitrofurantoin Monohydrate Macrocrystals] Hives   Shellfish Allergy Hives    Patient also allergic to seafood    Medications: Outpatient Encounter Medications as of 04/22/2020  Medication Sig Note   Accu-Chek Softclix Lancets lancets Use as instructed    ACETAMINOPHEN PO Take by mouth as needed (pain).    albuterol (VENTOLIN HFA) 108 (90 Base) MCG/ACT inhaler Inhale 1 puff into the lungs every 6 (six) hours as needed.    ALPRAZolam (XANAX) 0.25 MG tablet Take 1 tablet (0.25 mg total) by mouth 3 (three) times daily as needed for anxiety.    amLODipine (NORVASC) 10 MG  tablet Take 1 tablet (10 mg total) by mouth daily.    atorvastatin (LIPITOR) 80 MG tablet Take 1 tablet (80 mg total) by mouth daily.    Biotin 5000 MCG CAPS Take 1 capsule by mouth daily.    Continuous Blood Gluc Receiver (FREESTYLE LIBRE 14 DAY READER) DEVI 1 each by Does not apply route daily.    Continuous Blood Gluc Sensor (FREESTYLE LIBRE 14 DAY SENSOR) MISC USE 1  ONCE DAILY    cyclobenzaprine (FLEXERIL) 5 MG tablet TAKE ONE TABLET BY MOUTH EVERYDAY AT BEDTIME    diclofenac sodium (VOLTAREN) 1 % GEL Apply 2 g topically 4 (four) times daily. 01/08/2020: Uses as needed.   dronedarone (MULTAQ) 400 MG tablet TAKE 1 TABLET BY MOUTH TWICE DAILY WITH A MEAL Filled by Dr [name]    ESBRIET 801 MG TABS TAKE 1 TABLET BY MOUTH 3 TIMES A DAY.    ezetimibe (ZETIA) 10 MG tablet Take 1 tablet (10 mg total) by mouth daily.    furosemide (LASIX) 40 MG tablet Take 1/2 (one-half) tablet by mouth once daily    glucose blood (ACCU-CHEK AVIVA PLUS) test strip Use once daily for glucose control.  Dx E11.9  For accu-chek aviva    glucose blood (ACCU-CHEK GUIDE) test strip Use as instructed    hydrochlorothiazide (HYDRODIURIL) 25 MG tablet Take 1 tablet (25 mg total) by mouth daily.    HYDROcodone-acetaminophen (NORCO) 10-325 MG tablet Take 1 tablet by mouth every 6 (six) hours as needed.  Lactobacillus (ACIDOPHILUS) 0.5 MG TABS Take 1 tablet by mouth daily.    lactulose (CONSTULOSE) 10 GM/15ML solution TAKE 30 MILLILITERS BY MOUTH  THREE TIMES DAILY AS NEEDED FOR MILD CONSTIPATION    lidocaine (LIDODERM) 5 % Place 1 patch onto the skin daily. Remove & Discard patch within 12 hours or as directed by MD    losartan (COZAAR) 100 MG tablet Take 1 tablet (100 mg total) by mouth daily.    metFORMIN (GLUCOPHAGE-XR) 500 MG 24 hr tablet Take 500 mg by mouth daily with breakfast.    polyethylene glycol powder (GLYCOLAX/MIRALAX) powder Take 17 g by mouth daily.    rivaroxaban (XARELTO) 20 MG  TABS tablet TAKE 1 TABLET BY MOUTH ONCE DAILY WITH SUPPER    senna (SENOKOT) 8.6 MG TABS tablet Take 1 tablet by mouth daily as needed for mild constipation.    No facility-administered encounter medications on file as of 04/22/2020.    Current Diagnosis: Patient Active Problem List   Diagnosis Date Noted   Vitamin D deficiency 12/05/2019   Healthcare maintenance 11/09/2018   IPF (idiopathic pulmonary fibrosis) ([place]) 11/09/2018   Therapeutic drug monitoring 04/20/2018   Paroxysmal atrial fibrillation ([place]) 04/20/2018   Atrial fibrillation with RVR ([place]) 11/03/2017   LVH (left ventricular hypertrophy) 09/22/2017   Diabetes mellitus without complication ([place]) 81/85/6314   Degeneration of lumbar intervertebral disc 07/06/2017   Pulmonary emboli ([place]) 05/02/2016   Right leg pain 05/01/2016   ILD (interstitial lung disease) ([place]) 05/01/2016   Bradycardia 05/01/2016   Coronary artery calcification seen on CAT scan 08/06/2015   Open angle with borderline findings, low risk, bilateral 04/29/2015   UIP (usual interstitial pneumonitis) ([place]) 04/05/2015   Bilateral posterior capsular opacification 09/21/2014   Pseudophakia of both eyes 07/27/2014   Combined form of senile cataract of both eyes 05/30/2014   Osteoarthritis 09/17/2011   Narrowing of intervertebral disc space 09/17/2011   Pyelonephritis with possible nonobstructing 5 mm calculus by renal ultrasound 09/16/2011   Hypokalemia 09/16/2011   External hemorrhoids 09/13/2011   Obstructive sleep apnea syndrome 06/10/2011   Anal fissure 02/17/2011   History of colonic polyps 04/03/2010   Respiratory condition due to chemical fumes and vapors ([place]) 01/20/2010   Urinary incontinence 04/30/2009   Insomnia 09/28/2007   Asthma 11/22/2006   Dyslipidemia 08/19/2006   HTN (hypertension) 08/19/2006   GERD 08/19/2006   Low back pain 08/19/2006    Goals Addressed   None    Reviewed chart prior to  disease state call. Spoke with patient regarding BP  Recent Office Vitals: BP Readings from Last 3 Encounters:  04/19/20 136/62  04/04/20 130/80  02/05/20 (!) 147/70   Pulse Readings from Last 3 Encounters:  04/19/20 65  04/04/20 64  02/05/20 (!) 55    Wt Readings from Last 3 Encounters:  04/19/20 172 lb (78 kg)  04/04/20 173 lb 9.6 oz (78.7 kg)  12/13/19 176 lb (79.8 kg)     Kidney Function Lab Results  Component Value Date/Time   CREATININE 0.99 02/05/2020 11:48 AM   CREATININE 0.93 12/01/2019 10:25 AM   CREATININE 0.89 10/12/2019 10:41 AM   GFR 75.22 10/12/2019 10:41 AM   GFRNONAA >60 02/05/2020 11:48 AM   GFRAA >60 11/04/2017 04:10 AM    BMP Latest Ref Rng & Units 02/05/2020 12/01/2019 10/12/2019  Glucose 70 - 99 mg/dL 95 85 96  BUN 8 - 23 mg/dL 16 16 10   Creatinine 0.44 - 1.00 mg/dL 0.99 0.93 0.89  BUN/Creat Ratio 6 -  22 (calc) - NOT APPLICABLE -  Sodium A999333 - 145 mmol/L 138 136 138  Potassium 3.5 - 5.1 mmol/L 3.2(L) 4.1 3.4(L)  Chloride 98 - 111 mmol/L 99 99 101  CO2 22 - 32 mmol/L 27 23 32  Calcium 8.9 - 10.3 mg/dL 9.8 10.0 9.7     Current antihypertensive regimen:  o Amlodipine 10 mg, 1 tablet once daily o Hydrochlorothiazide 25 mg, 1 tablet once daily o Losartan 100 mg, 1 tablet once daily  How often are you checking your Blood Pressure? daily  Current home BP readings:  o 02/06 129/53 o 02/07 132/63 o 02/08 142/87 o 02/09 138/65  What recent interventions/DTPs have been made by any provider to improve Blood Pressure control since last CPP Visit: None  Any recent hospitalizations or ED visits since last visit with CPP? No  What diet changes have been made to improve Blood Pressure Control?  o She has decreased her salt intake  What exercise is being done to improve your Blood Pressure Control?  o No change  Adherence Review: Is the patient currently on ACE/ARB medication? Yes Does the patient have >5 day gap between last estimated fill dates?  No  Follow-Up:  Pharmacist Review   Maia Breslow, [place] Assistant [phone]

## 2020-05-01 MED ORDER — ESBRIET 801 MG PO TABS: 1.0000 | ORAL_TABLET | Freq: Three times a day (TID) | ORAL | 10 refills | Status: DC

## 2020-05-01 NOTE — Addendum Note (Signed)
Addended by: Lia Foyer R on: 05/01/2020 08:53 AM   Modules accepted: Orders

## 2020-05-02 DIAGNOSIS — G4733 Obstructive sleep apnea (adult) (pediatric): Secondary | ICD-10-CM | POA: Diagnosis not present

## 2020-05-03 ENCOUNTER — Telehealth: Payer: Self-pay | Admitting: Pulmonary Disease

## 2020-05-03 NOTE — Telephone Encounter (Signed)
Left message for patient to call back. Esbriet was sent to [facility] on 05/01/20.

## 2020-05-06 ENCOUNTER — Encounter: Payer: HMO | Admitting: *Deleted

## 2020-05-06 DIAGNOSIS — J84112 Idiopathic pulmonary fibrosis: Secondary | ICD-10-CM

## 2020-05-06 NOTE — Telephone Encounter (Signed)
Called pharmacy to ensure receipt of medication, placed on hold for excessive amount of time.   LM with patient.   Will hold message for now.

## 2020-05-06 NOTE — Research (Signed)
IPF-PRO Registry  Purpose: To collect data and biological samples that will support future research studies.  The goal of the registry is to research the current approaches to diagnosis, treatment, and progression of IPF. In addition, the registry will analyze participant characteristics, assess quality of life, describe participants interactions with the health care system, determine IPF treatment practices across multiple institutions, and utilize biological samples to identify disease biomarkers.   Clinical Research Coordinator / Research RN note : This visit for Subject Miranda [name] with DOB: 22-Feb-1946 on 05/06/2020 for the above protocol is Visit/Encounter #42  and is for purpose of research.   The consent for this encounter is under Protocol Version Amendment 4 (12Sep2019) and is currently IRB approved. Subject expressed continued interest and consent in continuing as a study subject. Subject confirmed that there was no change in contact information (e.g. address, telephone, email). Subject thanked for participation in research and contribution to science.  In this visit 05/06/20 the subject completed blood work and questionnaires per the above referenced protocol. Please refer to the subject's paper source binder for details.   [place] Coordinator [place], [place] 9:34 AM 05/06/2020

## 2020-05-07 NOTE — Telephone Encounter (Signed)
Called and spoke to pt. Pt states she received the shipment of Esbriet and denies any questions or concerns at this time. Will close encounter.

## 2020-05-09 ENCOUNTER — Telehealth: Payer: Self-pay | Admitting: Internal Medicine

## 2020-05-09 ENCOUNTER — Other Ambulatory Visit: Payer: Self-pay | Admitting: Internal Medicine

## 2020-05-09 DIAGNOSIS — F419 Anxiety disorder, unspecified: Secondary | ICD-10-CM

## 2020-05-09 DIAGNOSIS — M5416 Radiculopathy, lumbar region: Secondary | ICD-10-CM | POA: Diagnosis not present

## 2020-05-09 NOTE — Telephone Encounter (Signed)
How is she taking this? She was given 90 tabs on 1/20 and my note says it was supposed to last for 90 days. Does she have a pain contract with me?

## 2020-05-09 NOTE — Telephone Encounter (Addendum)
Patient is calling and requesting a refill for HYDROcodone-acetaminophen (NORCO) 10-325 MG andALPRAZolam Duanne Moron) 0.25 MG tablet   tablet sent to [facility] [address] [place]) [place] [address]  Phone:  [phone] Fax:  [phone]  CB is [phone]

## 2020-05-10 NOTE — Telephone Encounter (Signed)
Spoke with patient and an office visit scheuled

## 2020-05-14 ENCOUNTER — Other Ambulatory Visit: Payer: Self-pay | Admitting: Pulmonary Disease

## 2020-05-14 NOTE — Telephone Encounter (Signed)
Patient scheduled an office visit 05/16/20

## 2020-05-15 ENCOUNTER — Telehealth: Payer: Self-pay | Admitting: Pulmonary Disease

## 2020-05-15 ENCOUNTER — Other Ambulatory Visit: Payer: Self-pay

## 2020-05-15 NOTE — Telephone Encounter (Signed)
Called and spoke with [place] at [place]. Provided her with a verbal for Esbriet.   Nothing further needed at time of call.

## 2020-05-16 ENCOUNTER — Encounter: Payer: Self-pay | Admitting: Internal Medicine

## 2020-05-16 ENCOUNTER — Ambulatory Visit (INDEPENDENT_AMBULATORY_CARE_PROVIDER_SITE_OTHER): Payer: HMO | Admitting: Internal Medicine

## 2020-05-16 VITALS — BP 130/80 | HR 64 | Temp 98.6°F | Wt 176.1 lb

## 2020-05-16 DIAGNOSIS — E782 Mixed hyperlipidemia: Secondary | ICD-10-CM | POA: Diagnosis not present

## 2020-05-16 DIAGNOSIS — E119 Type 2 diabetes mellitus without complications: Secondary | ICD-10-CM | POA: Diagnosis not present

## 2020-05-16 DIAGNOSIS — G8929 Other chronic pain: Secondary | ICD-10-CM | POA: Diagnosis not present

## 2020-05-16 DIAGNOSIS — I10 Essential (primary) hypertension: Secondary | ICD-10-CM

## 2020-05-16 DIAGNOSIS — G894 Chronic pain syndrome: Secondary | ICD-10-CM | POA: Diagnosis not present

## 2020-05-16 DIAGNOSIS — J84112 Idiopathic pulmonary fibrosis: Secondary | ICD-10-CM

## 2020-05-16 DIAGNOSIS — Z86010 Personal history of colonic polyps: Secondary | ICD-10-CM

## 2020-05-16 DIAGNOSIS — I480 Paroxysmal atrial fibrillation: Secondary | ICD-10-CM

## 2020-05-16 DIAGNOSIS — M545 Low back pain, unspecified: Secondary | ICD-10-CM

## 2020-05-16 DIAGNOSIS — E1169 Type 2 diabetes mellitus with other specified complication: Secondary | ICD-10-CM

## 2020-05-16 LAB — POCT GLYCOSYLATED HEMOGLOBIN (HGB A1C): Hemoglobin A1C: 6 % — AB (ref 4.0–5.6)

## 2020-05-16 MED ORDER — HYDROCODONE-ACETAMINOPHEN 5-325 MG PO TABS
1.0000 | ORAL_TABLET | Freq: Four times a day (QID) | ORAL | 0 refills | Status: DC | PRN
Start: 2020-05-16 — End: 2020-08-15

## 2020-05-16 MED ORDER — HYDROCODONE-ACETAMINOPHEN 5-325 MG PO TABS: 1.0000 | ORAL_TABLET | Freq: Four times a day (QID) | ORAL | 0 refills | Status: DC | PRN

## 2020-05-16 NOTE — Progress Notes (Signed)
Established Patient Office Visit     This visit occurred during the SARS-CoV-2 public health emergency.  Safety protocols were in place, including screening questions prior to the visit, additional usage of staff PPE, and extensive cleaning of exam room while observing appropriate contact time as indicated for disinfecting solutions.    CC/Reason for Visit: Discuss chronic pain management and follow-up chronic conditions  HPI: Miranda [name] is a 75 y.o. female who is coming in today for the above mentioned reasons. Past Medical History is significant for: Atrial fibrillation chronically anticoagulated onXareltoon rhythm therapy with Multaqand rate controlled on metoprolol,benign essential hypertensionthat hasnotbeen well controlledrecently, prior history of PE, interstitial lung disease, GERD, type 2 diabetes, hyperlipidemia. She also has chronic back pain sees Dr. Nelva Bush for epidural injections, she has an upcoming appointment on March 17. She does not have a signed pain contract with me. Her prior PCP had been prescribing hydrocodone every 6 hours. She is interested in pursuing a pain contract with me. She averages 3 tablets a day, she will sometimes take for when pain is bad and sometimes will take 2. She is overdue for screening colonoscopy, she had a flex sig in 2012. Her A. fib has been well controlled, she follows with pulmonary for her IPF.   Past Medical/Surgical History: Past Medical History:  Diagnosis Date  . Anxiety   . Arthritis   . Asthma   . Atrial fibrillation ([place])   . CHF (congestive heart failure) ([place])    pt. unsure- but thinks she was hosp. for CHF- 2002  . [facility]  . Clotting disorder ([place])    blood clots in lungs/PE pulmonary embolism  . Colon polyps   . Complication of anesthesia    states requires a lot med. to put her to sleep   . DDD (degenerative disc disease) 09/17/2011  . Depression    "sometimes "   . DM (diabetes mellitus) ([place])   . Family history of anesthesia complication   . GERD (gastroesophageal reflux disease)   . Glaucoma    bilateral, pt. admits that she is noncompliant to eye gtts.   . Hemorrhoids   . Hyperlipidemia   . Hypertension    had stress, echo- 2006 /w [place], Cardiac Cath, per pt. 2002, echo repeated 2012- w[facility]   . Low back pain   . Shortness of breath   . Sleep apnea    uses c-pap- q night recently    Past Surgical History:  Procedure Laterality Date  . ABDOMINAL HYSTERECTOMY     ectopic, fibroids  . arm surgery Right   . CARDIAC CATHETERIZATION    . CATARACT EXTRACTION, BILATERAL     cataracts removed bilateral- ?IOL  . COLONOSCOPY     remote  . FISSURECTOMY  10/08/2011   Procedure: FISSURECTOMY;  Surgeon: Stark Klein, MD;  Location: [place];  Service: General;  Laterality: N/A;  . FLEXIBLE SIGMOIDOSCOPY  02/25/2011   Procedure: FLEXIBLE SIGMOIDOSCOPY;  Surgeon: Inda Castle, MD;  Location: [facility];  Service: Endoscopy;  Laterality: N/A;  . FOOT SURGERY     bilat, heel spurs- screw in R foot   . HEMORRHOID SURGERY  10/08/2011   Procedure: HEMORRHOIDECTOMY;  Surgeon: Stark Klein, MD;  Location: [place];  Service: General;  Laterality: N/A;  External   . SPHINCTEROTOMY  10/08/2011   Procedure: Joan Mayans;  Surgeon: Stark Klein, MD;  Location: [place];  Service: General;  Laterality: N/A;  Social History:  reports that she has never smoked. She has never used smokeless tobacco. She reports that she does not drink alcohol and does not use drugs.  Allergies: Allergies  Allergen Reactions  . Fish Oil Anaphylaxis  . Other Hives, Shortness Of Breath and Swelling    Allergic to cashew nuts and peanut oil.  Marland Kitchen Penicillins Anaphylaxis, Hives and Swelling    Has patient had a PCN reaction causing immediate rash, facial/tongue/throat swelling, SOB or lightheadedness with hypotension: Face swelling and hives started first, then swelling of the  throat  Has patient had a PCN reaction causing severe rash involving mucus membranes or skin necrosis: Yes  Has patient had a PCN reaction that required hospitalization: No  Has patient had a PCN reaction occurring within the last 10 years: Yes  If all of the above answers are "NO", then may proceed with Cephalospor  . Pneumococcal Vaccines Nausea And Vomiting  . Cashew Nut Oil   . Nitrofurantoin Macrocrystal Hives  . Peanut Oil   . Aspirin Itching and Rash  . Bactrim [Sulfamethoxazole-Trimethoprim] Hives, Itching and Rash  . Ciprofloxacin Hives, Itching and Rash  . Ibuprofen Rash  . Influenza Vaccines Hives  . Ivp Dye [Iodinated Diagnostic Agents] Hives, Itching and Rash    Gives benadryl to counteract symptoms  . Latex Rash  . Macrobid [Nitrofurantoin Monohydrate Macrocrystals] Hives  . Shellfish Allergy Hives    Patient also allergic to seafood    Family History:  Family History  Problem Relation Age of Onset  . Heart attack Mother   . Heart disease Mother   . Breast cancer Mother   . Emphysema Sister   . Breast cancer Sister   . Arthritis/Rheumatoid Sister   . Asthma Sister   . Lung cancer Sister   . COPD Sister   . Colon cancer Brother   . Colon cancer Brother   . Anesthesia problems Neg Hx      Current Outpatient Medications:  .  Accu-Chek Softclix Lancets lancets, Use as instructed, Disp: 100 each, Rfl: 12 .  ACETAMINOPHEN PO, Take by mouth as needed (pain)., Disp: , Rfl:  .  albuterol (VENTOLIN HFA) 108 (90 Base) MCG/ACT inhaler, Inhale 1 puff into the lungs every 6 (six) hours as needed., Disp: 18 g, Rfl: 1 .  ALPRAZolam (XANAX) 0.25 MG tablet, TAKE 1 TABLET BY MOUTH THREE TIMES DAILY AS NEEDED FOR ANXIETY, Disp: 90 tablet, Rfl: 0 .  amLODipine (NORVASC) 10 MG tablet, Take 1 tablet (10 mg total) by mouth daily., Disp: 90 tablet, Rfl: 1 .  atorvastatin (LIPITOR) 80 MG tablet, Take 1 tablet (80 mg total) by mouth daily., Disp: 90 tablet, Rfl: 1 .  Biotin 5000 MCG  CAPS, Take 1 capsule by mouth daily., Disp: , Rfl:  .  Continuous Blood Gluc Receiver (FREESTYLE LIBRE 14 DAY READER) DEVI, 1 each by Does not apply route daily., Disp: 1 each, Rfl: 2 .  Continuous Blood Gluc Sensor (FREESTYLE LIBRE 14 DAY SENSOR) MISC, USE 1  ONCE DAILY, Disp: 2 each, Rfl: 0 .  cyclobenzaprine (FLEXERIL) 5 MG tablet, TAKE ONE TABLET BY MOUTH EVERYDAY AT BEDTIME, Disp: 71 tablet, Rfl: 1 .  diclofenac sodium (VOLTAREN) 1 % GEL, Apply 2 g topically 4 (four) times daily., Disp: 1 Tube, Rfl: 1 .  dronedarone (MULTAQ) 400 MG tablet, TAKE 1 TABLET BY MOUTH TWICE DAILY WITH A MEAL Filled by Dr [name], Disp: 180 tablet, Rfl: 3 .  ezetimibe (ZETIA) 10 MG tablet, Take  1 tablet (10 mg total) by mouth daily., Disp: 90 tablet, Rfl: 1 .  furosemide (LASIX) 40 MG tablet, Take 1/2 (one-half) tablet by mouth once daily, Disp: 45 tablet, Rfl: 1 .  glucose blood (ACCU-CHEK AVIVA PLUS) test strip, Use once daily for glucose control.  Dx E11.9  For accu-chek aviva, Disp: 100 each, Rfl: 12 .  glucose blood (ACCU-CHEK GUIDE) test strip, Use as instructed, Disp: 100 each, Rfl: 12 .  hydrochlorothiazide (HYDRODIURIL) 25 MG tablet, Take 1 tablet (25 mg total) by mouth daily., Disp: 90 tablet, Rfl: 1 .  HYDROcodone-acetaminophen (NORCO/VICODIN) 5-325 MG tablet, Take 1 tablet by mouth every 6 (six) hours as needed for moderate pain., Disp: 90 tablet, Rfl: 0 .  HYDROcodone-acetaminophen (NORCO/VICODIN) 5-325 MG tablet, Take 1 tablet by mouth every 6 (six) hours as needed for moderate pain., Disp: 90 tablet, Rfl: 0 .  HYDROcodone-acetaminophen (NORCO/VICODIN) 5-325 MG tablet, Take 1 tablet by mouth every 6 (six) hours as needed for moderate pain., Disp: 90 tablet, Rfl: 0 .  Lactobacillus (ACIDOPHILUS) 0.5 MG TABS, Take 1 tablet by mouth daily., Disp: , Rfl:  .  lactulose (CONSTULOSE) 10 GM/15ML solution, TAKE 30 MILLILITERS BY MOUTH  THREE TIMES DAILY AS NEEDED FOR MILD CONSTIPATION, Disp: 473 mL, Rfl: 0 .   lidocaine (LIDODERM) 5 %, Place 1 patch onto the skin daily. Remove & Discard patch within 12 hours or as directed by MD, Disp: 15 patch, Rfl: 0 .  losartan (COZAAR) 100 MG tablet, Take 1 tablet (100 mg total) by mouth daily., Disp: 90 tablet, Rfl: 1 .  metFORMIN (GLUCOPHAGE-XR) 500 MG 24 hr tablet, Take 500 mg by mouth daily with breakfast., Disp: , Rfl:  .  Pirfenidone (ESBRIET) 801 MG TABS, Take 1 tablet by mouth 3 (three) times daily., Disp: 90 tablet, Rfl: 10 .  polyethylene glycol powder (GLYCOLAX/MIRALAX) powder, Take 17 g by mouth daily., Disp: 3350 g, Rfl: 1 .  rivaroxaban (XARELTO) 20 MG TABS tablet, TAKE 1 TABLET BY MOUTH ONCE DAILY WITH SUPPER, Disp: 90 tablet, Rfl: 1 .  senna (SENOKOT) 8.6 MG TABS tablet, Take 1 tablet by mouth daily as needed for mild constipation., Disp: , Rfl:   Review of Systems:  Constitutional: Denies fever, chills, diaphoresis, appetite change and fatigue.  HEENT: Denies photophobia, eye pain, redness, hearing loss, ear pain, congestion, sore throat, rhinorrhea, sneezing, mouth sores, trouble swallowing, neck pain, neck stiffness and tinnitus.   Respiratory: Denies SOB, DOE, cough, chest tightness,  and wheezing.   Cardiovascular: Denies chest pain, palpitations and leg swelling.  Gastrointestinal: Denies nausea, vomiting, abdominal pain, diarrhea, constipation, blood in stool and abdominal distention.  Genitourinary: Denies dysuria, urgency, frequency, hematuria, flank pain and difficulty urinating.  Endocrine: Denies: hot or cold intolerance, sweats, changes in hair or nails, polyuria, polydipsia. Musculoskeletal: Denies myalgias, joint swelling, arthralgias and gait problem.  Skin: Denies pallor, rash and wound.  Neurological: Denies dizziness, seizures, syncope, weakness, light-headedness, numbness and headaches.  Hematological: Denies adenopathy. Easy bruising, personal or family bleeding history  Psychiatric/Behavioral: Denies suicidal ideation, mood  changes, confusion, nervousness, sleep disturbance and agitation    Physical Exam: Vitals:   05/16/20 1104  BP: 130/80  Pulse: 64  Temp: 98.6 F (37 C)  TempSrc: Oral  SpO2: 98%  Weight: 176 lb 1.6 oz (79.9 kg)    Body mass index is 29.3 kg/m.   Constitutional: NAD, calm, comfortable Eyes: PERRL, lids and conjunctivae normal ENMT: Mucous membranes are moist.  Neck: normal, supple, no masses, no  thyromegaly Respiratory: clear to auscultation bilaterally, no wheezing, no crackles. Normal respiratory effort. No accessory muscle use.  Cardiovascular: Regular rate and rhythm, no murmurs / rubs / gallops. No extremity edema.   Neurologic: PDMP reviewed, no red flags Psychiatric: Normal judgment and insight. Alert and oriented x 3. Normal mood.    Impression and Plan:  Paroxysmal atrial fibrillation (HCC) -Rhythm controlled on Multaq, anticoagulated on Xarelto, followed by cardiology.  History of colonic polyps -She is overdue for screening colonoscopy, will refer to GI today.  Chronic low back pain without sciatica, unspecified back pain laterality Chronic pain syndrome -PDMP reviewed, no red flags, overdose risk score 50. -I have agreed to enter into a pain contract with her today based on her previous narcotic prescription and her ongoing pain. -Pain contract will be signed by patient and placed into chart. -She will be prescribed hydrocodone 5/325 mg to take 1 tablet every 4 hours as needed for pain limited to 90 tablets a month x3 months. -She understands that she will require a visit every 3 months for renewal of narcotics.  DM type 2 with diabetic mixed hyperlipidemia (HCC) -A1c today demonstrates good control at 6.0.  Essential hypertension -Fair control today, followed by cardiology  IPF (idiopathic pulmonary fibrosis) ([place]) -Stable, followed by pulmonary, not on chronic oxygen. -On Esbriet.    [name], MD [facility] at  [facility]

## 2020-05-23 ENCOUNTER — Telehealth: Payer: Self-pay | Admitting: Pharmacist

## 2020-05-24 NOTE — Chronic Care Management (AMB) (Signed)
Chronic Care Management Pharmacy Assistant   Name: TAMAKA SAWIN  MRN: [id number] DOB: May 02, 1945  Reason for Encounter: Medication Review   Recent office visits:  03.02.2022 [name], [name], MD Internal Medicine  Recent consult visits:  None  Hospital visits:  None in previous 6 months  Medications: Outpatient Encounter Medications as of 05/23/2020  Medication Sig Note  . Accu-Chek Softclix Lancets lancets Use as instructed   . ACETAMINOPHEN PO Take by mouth as needed (pain).   Marland Kitchen albuterol (VENTOLIN HFA) 108 (90 Base) MCG/ACT inhaler Inhale 1 puff into the lungs every 6 (six) hours as needed.   . ALPRAZolam (XANAX) 0.25 MG tablet TAKE 1 TABLET BY MOUTH THREE TIMES DAILY AS NEEDED FOR ANXIETY   . amLODipine (NORVASC) 10 MG tablet Take 1 tablet (10 mg total) by mouth daily.   Marland Kitchen atorvastatin (LIPITOR) 80 MG tablet Take 1 tablet (80 mg total) by mouth daily.   . Biotin 5000 MCG CAPS Take 1 capsule by mouth daily.   . Continuous Blood Gluc Receiver (FREESTYLE LIBRE 14 DAY READER) DEVI 1 each by Does not apply route daily.   . Continuous Blood Gluc Sensor (FREESTYLE LIBRE 14 DAY SENSOR) MISC USE 1  ONCE DAILY   . cyclobenzaprine (FLEXERIL) 5 MG tablet TAKE ONE TABLET BY MOUTH EVERYDAY AT BEDTIME   . diclofenac sodium (VOLTAREN) 1 % GEL Apply 2 g topically 4 (four) times daily. 01/08/2020: Uses as needed.  . dronedarone (MULTAQ) 400 MG tablet TAKE 1 TABLET BY MOUTH TWICE DAILY WITH A MEAL Filled by Dr [name]   . ezetimibe (ZETIA) 10 MG tablet Take 1 tablet (10 mg total) by mouth daily.   . furosemide (LASIX) 40 MG tablet Take 1/2 (one-half) tablet by mouth once daily   . glucose blood (ACCU-CHEK AVIVA PLUS) test strip Use once daily for glucose control.  Dx E11.9  For accu-chek aviva   . glucose blood (ACCU-CHEK GUIDE) test strip Use as instructed   . hydrochlorothiazide (HYDRODIURIL) 25 MG tablet Take 1 tablet (25 mg total) by mouth daily.   Marland Kitchen  HYDROcodone-acetaminophen (NORCO/VICODIN) 5-325 MG tablet Take 1 tablet by mouth every 6 (six) hours as needed for moderate pain.   Marland Kitchen HYDROcodone-acetaminophen (NORCO/VICODIN) 5-325 MG tablet Take 1 tablet by mouth every 6 (six) hours as needed for moderate pain.   Marland Kitchen HYDROcodone-acetaminophen (NORCO/VICODIN) 5-325 MG tablet Take 1 tablet by mouth every 6 (six) hours as needed for moderate pain.   . Lactobacillus (ACIDOPHILUS) 0.5 MG TABS Take 1 tablet by mouth daily.   Marland Kitchen lactulose (CONSTULOSE) 10 GM/15ML solution TAKE 30 MILLILITERS BY MOUTH  THREE TIMES DAILY AS NEEDED FOR MILD CONSTIPATION   . lidocaine (LIDODERM) 5 % Place 1 patch onto the skin daily. Remove & Discard patch within 12 hours or as directed by MD   . losartan (COZAAR) 100 MG tablet Take 1 tablet (100 mg total) by mouth daily.   . metFORMIN (GLUCOPHAGE-XR) 500 MG 24 hr tablet Take 500 mg by mouth daily with breakfast.   . Pirfenidone (ESBRIET) 801 MG TABS Take 1 tablet by mouth 3 (three) times daily.   . polyethylene glycol powder (GLYCOLAX/MIRALAX) powder Take 17 g by mouth daily.   . rivaroxaban (XARELTO) 20 MG TABS tablet TAKE 1 TABLET BY MOUTH ONCE DAILY WITH SUPPER   . senna (SENOKOT) 8.6 MG TABS tablet Take 1 tablet by mouth daily as needed for mild constipation.    No facility-administered encounter medications on file  as of 05/23/2020.   I attempted to reach the patient on several occasions for her monthly adherence call. I did leave messages. The patient was confused about what she needed to do and did not call back. The CPP did get in contact with her, and she will be getting her medications from [facility] for this month period she has made a new appointment and we'll start [facility] afterward.  Maia Breslow, [place] Assistant [phone]

## 2020-05-29 ENCOUNTER — Telehealth: Payer: Self-pay | Admitting: Internal Medicine

## 2020-05-29 DIAGNOSIS — I10 Essential (primary) hypertension: Secondary | ICD-10-CM

## 2020-05-29 MED ORDER — AMLODIPINE BESYLATE 10 MG PO TABS: 10.0000 mg | ORAL_TABLET | Freq: Every day | ORAL | 1 refills | Status: DC

## 2020-05-29 MED ORDER — FUROSEMIDE 40 MG PO TABS: ORAL_TABLET | ORAL | 1 refills | Status: DC

## 2020-05-29 MED ORDER — EZETIMIBE 10 MG PO TABS: 10.0000 mg | ORAL_TABLET | Freq: Every day | ORAL | 1 refills | Status: DC

## 2020-05-29 MED ORDER — HYDROCHLOROTHIAZIDE 25 MG PO TABS: 25.0000 mg | ORAL_TABLET | Freq: Every day | ORAL | 1 refills | Status: DC

## 2020-05-29 NOTE — Telephone Encounter (Signed)
Refills sent.  dronedarone (MULTAQ) 400 MG tablet will need to be filled by cardiology.  Patient is aware

## 2020-05-29 NOTE — Telephone Encounter (Signed)
Patient states Dr. Jerilee Hoh started some of her medications through [name] and some through [facility] and she is confused about her medications. She wants all her medications to be through [facility] if possible. Patient is needing a call back.  She is starting to run out of her meds and don't know what to do.   Ezetimibe 10 mg Furosemide HCTZ Amlodipine Dronedarone (Multaq)

## 2020-05-30 ENCOUNTER — Telehealth: Payer: Self-pay | Admitting: Internal Medicine

## 2020-05-30 DIAGNOSIS — M5416 Radiculopathy, lumbar region: Secondary | ICD-10-CM | POA: Diagnosis not present

## 2020-05-30 DIAGNOSIS — Z1211 Encounter for screening for malignant neoplasm of colon: Secondary | ICD-10-CM

## 2020-05-30 NOTE — Telephone Encounter (Signed)
Referral placed and Left message on machine for patient.

## 2020-05-30 NOTE — Telephone Encounter (Signed)
Pt is calling to see if a colonoscopy referral has been placed and would like to have a call back to let her know.

## 2020-06-03 ENCOUNTER — Telehealth: Payer: HMO

## 2020-06-04 ENCOUNTER — Ambulatory Visit (INDEPENDENT_AMBULATORY_CARE_PROVIDER_SITE_OTHER): Payer: HMO | Admitting: Cardiology

## 2020-06-04 ENCOUNTER — Encounter: Payer: Self-pay | Admitting: Cardiology

## 2020-06-04 ENCOUNTER — Other Ambulatory Visit: Payer: Self-pay

## 2020-06-04 DIAGNOSIS — I480 Paroxysmal atrial fibrillation: Secondary | ICD-10-CM

## 2020-06-04 MED ORDER — MULTAQ 400 MG PO TABS: ORAL_TABLET | ORAL | 1 refills | Status: DC

## 2020-06-04 MED ORDER — RIVAROXABAN 20 MG PO TABS: ORAL_TABLET | ORAL | 1 refills | Status: DC

## 2020-06-04 NOTE — Patient Instructions (Addendum)
Medication Instructions:  °Your physician recommends that you continue on your current medications as directed. Please refer to the Current Medication list given to you today. ° °*If you need a refill on your cardiac medications before your next appointment, please call your pharmacy* ° ° °Lab Work: °None ordered ° ° °Testing/Procedures: °None ordered ° ° °Follow-Up: °At [facility], you and your health needs are our priority.  As part of our continuing mission to provide you with exceptional heart care, we have created designated Provider Care Teams.  These Care Teams include your primary Cardiologist (physician) and Advanced Practice Providers (APPs -  Physician Assistants and Nurse Practitioners) who all work together to provide you with the care you need, when you need it. ° °Your next appointment:   °6 month(s) ° °The format for your next appointment:   °In Person ° °Provider:   °Will [name], MD ° ° ° °Thank you for choosing [facility]!! ° ° °Bryssa Tones, RN °[phone] °  °

## 2020-06-04 NOTE — Progress Notes (Signed)
Electrophysiology Office Note   Date:  06/04/2020   ID:  Miranda [name], Miranda [name] 02-01-1946, MRN [id number]  PCP:  Miranda [name], Miranda Halsted, MD  Cardiologist:  [name] Primary Electrophysiologist:  Will Meredith Leeds, MD    No chief complaint on file.    History of Present Illness: Miranda [name] is a 75 y.o. female who is being seen today for the evaluation of atrial fibrillation at the request of Miranda [name]. Presenting today for electrophysiology evaluation.    She has a history of depression, GERD, hypertension, hyperlipidemia, sleep apnea on CPAP, diabetes, and atrial fibrillation.  She also has coronary artery calcifications.  She was diagnosed with PE in 2018 and was started on Xarelto at that time.  She was seen in the emergency room 09/14/2017 after being found in atrial fibrillation with rapid rates.  She has been on maintained on Multaq.  Today, denies symptoms of palpitations, chest pain, shortness of breath, orthopnea, PND, lower extremity edema, claudication, dizziness, presyncope, syncope, bleeding, or neurologic sequela. The patient is tolerating medications without difficulties.  She has no complaints today.  She is currently feeling well.  She has had no episodes of atrial fibrillation.   Past Medical History:  Diagnosis Date  . Anxiety   . Arthritis   . Asthma   . Atrial fibrillation ([place])   . CHF (congestive heart failure) ([place])    pt. unsure- but thinks she was hosp. for CHF- 2002  . [facility]  . Clotting disorder ([place])    blood clots in lungs/PE pulmonary embolism  . Colon polyps   . Complication of anesthesia    states requires a lot med. to put her to sleep   . DDD (degenerative disc disease) 09/17/2011  . Depression    "sometimes "  . DM (diabetes mellitus) ([place])   . Family history of anesthesia complication   . GERD (gastroesophageal reflux disease)   . Glaucoma    bilateral, pt. admits that she is noncompliant  to eye gtts.   . Hemorrhoids   . Hyperlipidemia   . Hypertension    had stress, echo- 2006 /w Rachel, Cardiac Cath, per pt. 2002, echo repeated 2012- wnl   . Low back pain   . Shortness of breath   . Sleep apnea    uses c-pap- q night recently   Past Surgical History:  Procedure Laterality Date  . ABDOMINAL HYSTERECTOMY     ectopic, fibroids  . arm surgery Right   . CARDIAC CATHETERIZATION    . CATARACT EXTRACTION, BILATERAL     cataracts removed bilateral- ?IOL  . COLONOSCOPY     remote  . FISSURECTOMY  10/08/2011   Procedure: FISSURECTOMY;  Surgeon: Stark Klein, MD;  Location: [place];  Service: General;  Laterality: N/A;  . FLEXIBLE SIGMOIDOSCOPY  02/25/2011   Procedure: FLEXIBLE SIGMOIDOSCOPY;  Surgeon: Inda Castle, MD;  Location: [facility];  Service: Endoscopy;  Laterality: N/A;  . FOOT SURGERY     bilat, heel spurs- screw in R foot   . HEMORRHOID SURGERY  10/08/2011   Procedure: HEMORRHOIDECTOMY;  Surgeon: Stark Klein, MD;  Location: [place];  Service: General;  Laterality: N/A;  External   . SPHINCTEROTOMY  10/08/2011   Procedure: Joan Mayans;  Surgeon: Stark Klein, MD;  Location: [place] [facility];  Service: General;  Laterality: N/A;     Current Outpatient Medications  Medication Sig Dispense Refill  . Accu-Chek Softclix Lancets lancets Use as  instructed 100 each 12  . ACETAMINOPHEN PO Take by mouth as needed (pain).    Marland Kitchen albuterol (VENTOLIN HFA) 108 (90 Base) MCG/ACT inhaler Inhale 1 puff into the lungs every 6 (six) hours as needed. 18 g 1  . ALPRAZolam (XANAX) 0.25 MG tablet TAKE 1 TABLET BY MOUTH THREE TIMES DAILY AS NEEDED FOR ANXIETY 90 tablet 0  . amLODipine (NORVASC) 10 MG tablet Take 1 tablet (10 mg total) by mouth daily. 90 tablet 1  . atorvastatin (LIPITOR) 80 MG tablet Take 1 tablet (80 mg total) by mouth daily. 90 tablet 1  . Biotin 5000 MCG CAPS Take 1 capsule by mouth daily.    . Continuous Blood Gluc Receiver (FREESTYLE LIBRE 14 DAY READER) DEVI 1 each  by Does not apply route daily. 1 each 2  . Continuous Blood Gluc Sensor (FREESTYLE LIBRE 14 DAY SENSOR) MISC USE 1  ONCE DAILY 2 each 0  . cyclobenzaprine (FLEXERIL) 5 MG tablet TAKE ONE TABLET BY MOUTH EVERYDAY AT BEDTIME 71 tablet 1  . diclofenac sodium (VOLTAREN) 1 % GEL Apply 2 g topically 4 (four) times daily. 1 Tube 1  . ezetimibe (ZETIA) 10 MG tablet Take 1 tablet (10 mg total) by mouth daily. 90 tablet 1  . furosemide (LASIX) 40 MG tablet Take 1/2 (one-half) tablet by mouth once daily 45 tablet 1  . glucose blood (ACCU-CHEK AVIVA PLUS) test strip Use once daily for glucose control.  Dx E11.9  For accu-chek aviva 100 each 12  . glucose blood (ACCU-CHEK GUIDE) test strip Use as instructed 100 each 12  . hydrochlorothiazide (HYDRODIURIL) 25 MG tablet Take 1 tablet (25 mg total) by mouth daily. 90 tablet 1  . HYDROcodone-acetaminophen (NORCO/VICODIN) 5-325 MG tablet Take 1 tablet by mouth every 6 (six) hours as needed for moderate pain. 90 tablet 0  . HYDROcodone-acetaminophen (NORCO/VICODIN) 5-325 MG tablet Take 1 tablet by mouth every 6 (six) hours as needed for moderate pain. 90 tablet 0  . HYDROcodone-acetaminophen (NORCO/VICODIN) 5-325 MG tablet Take 1 tablet by mouth every 6 (six) hours as needed for moderate pain. 90 tablet 0  . Lactobacillus (ACIDOPHILUS) 0.5 MG TABS Take 1 tablet by mouth daily.    Marland Kitchen lactulose (CONSTULOSE) 10 GM/15ML solution TAKE 30 MILLILITERS BY MOUTH  THREE TIMES DAILY AS NEEDED FOR MILD CONSTIPATION 473 mL 0  . lidocaine (LIDODERM) 5 % Place 1 patch onto the skin daily. Remove & Discard patch within 12 hours or as directed by MD 15 patch 0  . losartan (COZAAR) 100 MG tablet Take 1 tablet (100 mg total) by mouth daily. 90 tablet 1  . metFORMIN (GLUCOPHAGE-XR) 500 MG 24 hr tablet Take 500 mg by mouth daily with breakfast.    . Pirfenidone (ESBRIET) 801 MG TABS Take 1 tablet by mouth 3 (three) times daily. 90 tablet 10  . polyethylene glycol powder (GLYCOLAX/MIRALAX)  powder Take 17 g by mouth daily. 3350 g 1  . senna (SENOKOT) 8.6 MG TABS tablet Take 1 tablet by mouth daily as needed for mild constipation.    . dronedarone (MULTAQ) 400 MG tablet TAKE 1 TABLET BY MOUTH TWICE DAILY WITH A MEAL Filled by Dr [name] 180 tablet 1  . rivaroxaban (XARELTO) 20 MG TABS tablet TAKE 1 TABLET BY MOUTH ONCE DAILY WITH SUPPER 90 tablet 1   No current facility-administered medications for this visit.    Allergies:   Fish oil, Other, Penicillins, Pneumococcal vaccines, Cashew nut oil, Nitrofurantoin macrocrystal, Peanut oil, Aspirin, Bactrim [  sulfamethoxazole-trimethoprim], Ciprofloxacin, Ibuprofen, Influenza vaccines, Ivp dye [iodinated diagnostic agents], Latex, Macrobid [nitrofurantoin monohydrate macrocrystals], and Shellfish allergy   Social History:  The patient  reports that she has never smoked. She has never used smokeless tobacco. She reports that she does not drink alcohol and does not use drugs.   Family History:  The patient's family history includes Arthritis/Rheumatoid in her sister; Asthma in her sister; Breast cancer in her mother and sister; COPD in her sister; Colon cancer in her brother and brother; Emphysema in her sister; Heart attack in her mother; Heart disease in her mother; Lung cancer in her sister.   ROS:  Please see the history of present illness.   Otherwise, review of systems is positive for none.   All other systems are reviewed and negative.   PHYSICAL EXAM: VS:  BP (!) 112/56   Pulse 60   Ht 5\' 5"  (1.651 m)   Wt 177 lb 6.4 oz (80.5 kg)   SpO2 98%   BMI 29.52 kg/m  , BMI Body mass index is 29.52 kg/m. GEN: Well nourished, well developed, in no acute distress  HEENT: normal  Neck: no JVD, carotid bruits, or masses Cardiac: RRR; no murmurs, rubs, or gallops,no edema  Respiratory:  clear to auscultation bilaterally, normal work of breathing GI: soft, nontender, nondistended, + BS MS: no deformity or atrophy  Skin: warm and  dry Neuro:  Strength and sensation are intact Psych: euthymic mood, full affect  EKG:  EKG is not ordered today. Personal review of the ekg ordered 04/19/20 shows sinus rhythm, rate 65  Recent Labs: 10/12/2019: Pro B Natriuretic peptide (BNP) 51.0 12/01/2019: ALT 18; TSH 1.45 02/05/2020: BUN 16; Creatinine, Ser 0.99; Hemoglobin 13.2; Platelets 201; Potassium 3.2; Sodium 138    Lipid Panel     Component Value Date/Time   CHOL 171 12/01/2019 1025   CHOL 167 11/26/2017 0908   TRIG 70 12/01/2019 1025   HDL 65 12/01/2019 1025   HDL 61 11/26/2017 0908   CHOLHDL 2.6 12/01/2019 1025   VLDL 16.8 06/01/2014 1145   LDLCALC 90 12/01/2019 1025   LDLDIRECT 152.8 03/30/2011 0859     Wt Readings from Last 3 Encounters:  06/04/20 177 lb 6.4 oz (80.5 kg)  05/16/20 176 lb 1.6 oz (79.9 kg)  04/19/20 172 lb (78 kg)      Other studies Reviewed: Additional studies/ records that were reviewed today include: TTE 10/19/17  Review of the above records today demonstrates:  - Left ventricle: The cavity size was normal. Systolic function was   normal. The estimated ejection fraction was in the range of 60%   to 65%. Wall motion was normal; there were no regional wall   motion abnormalities. Doppler parameters are consistent with   abnormal left ventricular relaxation (grade 1 diastolic   dysfunction). - Aortic valve: There was no significant regurgitation. - Mitral valve: There was trivial regurgitation. - Left atrium: The atrium was mildly dilated. - Right ventricle: The cavity size was mildly dilated. Wall   thickness was normal. - Tricuspid valve: There was trivial regurgitation. - Pulmonic valve: There was trivial regurgitation. - Inferior vena cava: The vessel was normal in size. The   respirophasic diameter changes were in the normal range (>= 50%),   consistent with normal central venous pressure.  Myoview 11/12/17  Nuclear stress EF: 66%.  There was no ST segment deviation noted during  stress.  No T wave inversion was noted during stress.  The study is normal.  This is a low risk study.  The left ventricular ejection fraction is hyperdynamic (>65%).  ASSESSMENT AND PLAN:  1.  Paroxysmal atrial fibrillation: Currently on Xarelto and Multaq.  CHA2DS2-VASc of 4.  High risk medication monitoring.  She is fortunately remained in sinus rhythm.  We will continue with current management.  2.  Hypertension: Currently well controlled  3.  Hyperlipidemia: Continue Lipitor the goal LDL of less than 70.  4.  Obstructive sleep apnea: CPAP compliance encouraged  5.  Coronary artery disease: Has calcified LAD and circumflex found on CT scanning.  No current chest pain.  Current medicines are reviewed at length with the patient today.   The patient does not have concerns regarding her medicines.  The following changes were made today: None  Labs/ tests ordered today include:  No orders of the defined types were placed in this encounter.    Disposition:   FU with Will [name] 6 months  Signed, Will Meredith Leeds, MD  06/04/2020 11:23 AM     [facility] [address] [place] [address] [phone] (office) [phone] (fax)

## 2020-06-07 ENCOUNTER — Telehealth: Payer: Self-pay | Admitting: Pharmacist

## 2020-06-07 NOTE — Progress Notes (Signed)
I spoke with the patient about her upcoming appointment on 06/10/2020 @ 12:30 pm with the clinical pharmacist. She was asked to please have all medication on hand to review with the pharmacist. She confirmed the appointment.    Maia Breslow, [place] Assistant [phone]

## 2020-06-10 ENCOUNTER — Ambulatory Visit (INDEPENDENT_AMBULATORY_CARE_PROVIDER_SITE_OTHER): Payer: HMO | Admitting: Pharmacist

## 2020-06-10 ENCOUNTER — Other Ambulatory Visit: Payer: Self-pay

## 2020-06-10 DIAGNOSIS — E119 Type 2 diabetes mellitus without complications: Secondary | ICD-10-CM | POA: Diagnosis not present

## 2020-06-10 DIAGNOSIS — I10 Essential (primary) hypertension: Secondary | ICD-10-CM

## 2020-06-10 DIAGNOSIS — E785 Hyperlipidemia, unspecified: Secondary | ICD-10-CM | POA: Diagnosis not present

## 2020-06-10 DIAGNOSIS — I480 Paroxysmal atrial fibrillation: Secondary | ICD-10-CM

## 2020-06-10 NOTE — Progress Notes (Unsigned)
Chronic Care Management Pharmacy Note  06/13/2020 Name:  ELEANNA THEILEN MRN:  [id number] DOB:  1945/09/08  Subjective: [name] is an 75 y.o. year old female who is a primary patient of [name], [name], MD.  The CCM team was consulted for assistance with disease management and care coordination needs.    Engaged with patient face to face for follow up visit in response to provider referral for pharmacy case management and/or care coordination services.   Consent to Services:  The patient was given information about Chronic Care Management services, agreed to services, and gave verbal consent prior to initiation of services.  Please see initial visit note for detailed documentation.   Patient Care Team: [name], [name], MD as PCP - General (Internal Medicine) [name], [name], MD as PCP - Cardiology (Cardiology) [name], MD as PCP - Electrophysiology (Cardiology) [name], PA-C (Inactive) (Dermatology) [name], [facility] as Pharmacist (Pharmacist)  Recent office visits: 05/16/20 Domingo Mend, MD: Patient presented for diabetes follow up. Entered pain contract with hydrocodone 5/325 mg to be taken 1 tablet every 4 hours as needed limited to 90 tabs/month.  04/04/20 Domingo Mend, MD: Patient presented for breast pain. Recommended diagnostic mammogram.  Recent consult visits: 06/04/20 Allegra Lai, MD (cardiology): Patient presented for Afib electrophysiology evaluation.  05/09/20 Levy Pupa, PA (emergeortho): Unable to access notes.  05/06/20 Brand Males, MD (pulmonary): Patient presented for IPF research encounter.  04/19/20 [name], MD (cardiology): Patient presented for Afib follow up.   12/20/19 Beatrix Fetters (optometry): Patient presented for diabetic eye exam.  10/23/19 - Pulmonology - Rexene Edison, NP - Patient presented for follow up for OSA and pulmonary fibrosis. Noted 77% compliance with  CPAP.  Hospital visits: 02/05/20 Patient presented to the ED for chest wall pain.  01/07/20 Patient presented to the ED for a right toe fracture.  Objective:  Lab Results  Component Value Date   CREATININE 0.99 02/05/2020   BUN 16 02/05/2020   GFR 75.22 10/12/2019   GFRNONAA >60 02/05/2020   GFRAA >60 11/04/2017   NA 138 02/05/2020   K 3.2 (L) 02/05/2020   CALCIUM 9.8 02/05/2020   CO2 27 02/05/2020   GLUCOSE 95 02/05/2020    Lab Results  Component Value Date/Time   HGBA1C 6.0 (A) 05/16/2020 11:13 AM   HGBA1C 6.1 12/01/2019 10:25 AM   HGBA1C 5.9 (A) 10/19/2018 09:51 AM   HGBA1C 6.0 02/02/2018 12:20 PM   GFR 75.22 10/12/2019 10:41 AM   GFR 67.48 11/09/2018 10:20 AM    Last diabetic Eye exam: No results found for: HMDIABEYEEXA  Last diabetic Foot exam: No results found for: HMDIABFOOTEX   Lab Results  Component Value Date   CHOL 171 12/01/2019   HDL 65 12/01/2019   LDLCALC 90 12/01/2019   LDLDIRECT 152.8 03/30/2011   TRIG 70 12/01/2019   CHOLHDL 2.6 12/01/2019    Hepatic Function Latest Ref Rng & Units 12/01/2019 10/12/2019 11/09/2018  Total Protein 6.1 - 8.1 g/dL 8.1 7.8 8.2  Albumin 3.5 - 5.2 g/dL - 4.4 4.6  AST 10 - 35 U/L _0 ALT 6 - 29 U/L _1 Alk Phosphatase 39 - 117 U/L - 41 47  Total Bilirubin 0.2 - 1.2 mg/dL 0.7 0.6 0.5  Bilirubin, Direct 0.0 - 0.3 mg/dL - - -    Lab Results  Component Value Date/Time   TSH 1.45 12/01/2019 10:25 AM   TSH 1.499 09/14/2017 04:58  PM   TSH 0.86 06/01/2014 11:45 AM    CBC Latest Ref Rng & Units 02/05/2020 12/01/2019 11/02/2017  WBC 4.0 - 10.5 K/uL 5.8 6.3 -  Hemoglobin 12.0 - 15.0 g/dL 13.2 12.7 14.6  Hematocrit 36.0 - 46.0 % 41.5 37.5 43.0  Platelets 150 - 400 K/uL 201 189.0 -    Lab Results  Component Value Date/Time   VD25OH 14.58 (L) 12/01/2019 10:25 AM    Clinical ASCVD: No  The 10-year ASCVD risk score Mikey Bussing DC Jr., et al., 2013) is: 22.8%   Values used to calculate the score:     Age: 64  years     Sex: Female     Is Non-Hispanic African American: Yes     Diabetic: Yes     Tobacco smoker: No     Systolic Blood Pressure: 403 mmHg     Is BP treated: Yes     HDL Cholesterol: 65 mg/dL     Total Cholesterol: 171 mg/dL    Depression screen [facility] 2/9 11/29/2019 03/28/2019 01/02/2019  Decreased Interest 0 0 0  Down, Depressed, Hopeless 0 1 0  PHQ - 2 Score 0 1 0  Altered sleeping 1 - -  Tired, decreased energy 0 - -  Change in appetite 0 - -  Feeling bad or failure about yourself  0 - -  Trouble concentrating 0 - -  Moving slowly or fidgety/restless 0 - -  Suicidal thoughts 0 - -  PHQ-9 Score 1 - -  Some recent data might be hidden     CHA2DS2/VAS Stroke Risk Points  Current as of yesterday     5 >= 2 Points: High Risk  1 - 1.99 Points: Medium Risk  0 Points: Low Risk    Last Change: N/A      Details    This score determines the patient's risk of having a stroke if the  patient has atrial fibrillation.       Points Metrics  0 Has Congestive Heart Failure:  No    Current as of yesterday  1 Has Vascular Disease:  Yes    Current as of yesterday  1 Has Hypertension:  Yes    Current as of yesterday  1 Age:  38    Current as of yesterday  1 Has Diabetes:  Yes    Current as of yesterday  0 Had Stroke:  No  Had TIA:  No  Had Thromboembolism:  No    Current as of yesterday  1 Female:  Yes    Current as of yesterday     Social History   Tobacco Use  Smoking Status Never Smoker  Smokeless Tobacco Never Used   BP Readings from Last 3 Encounters:  06/04/20 (!) 112/56  05/16/20 130/80  04/19/20 136/62   Pulse Readings from Last 3 Encounters:  06/04/20 60  05/16/20 64  04/19/20 65   Wt Readings from Last 3 Encounters:  06/04/20 177 lb 6.4 oz (80.5 kg)  05/16/20 176 lb 1.6 oz (79.9 kg)  04/19/20 172 lb (78 kg)   BMI Readings from Last 3 Encounters:  06/04/20 29.52 kg/m  05/16/20 29.30 kg/m  04/19/20 28.62 kg/m    Assessment/Interventions: Review  of patient past medical history, allergies, medications, health status, including review of consultants reports, laboratory and other test data, was performed as part of comprehensive evaluation and provision of chronic care management services.   SDOH:  (Social Determinants of Health) assessments and interventions performed: No  SDOH Screenings   Alcohol Screen: Not on file  Depression (PHQ2-9): Low Risk   . PHQ-2 Score: 1  Financial Resource Strain: Low Risk   . Difficulty of Paying Living Expenses: Not hard at all  Food Insecurity: Not on file  Housing: Not on file  Physical Activity: Not on file  Social Connections: Not on file  Stress: Not on file  Tobacco Use: Low Risk   . Smoking Tobacco Use: Never Smoker  . Smokeless Tobacco Use: Never Used  Transportation Needs: No Transportation Needs  . Lack of Transportation (Medical): No  . Lack of Transportation (Non-Medical): No    CCM Care Plan  Allergies  Allergen Reactions  . Fish Oil Anaphylaxis  . Other Hives, Shortness Of Breath and Swelling    Allergic to cashew nuts and peanut oil.  Marland Kitchen Penicillins Anaphylaxis, Hives and Swelling    Has patient had a PCN reaction causing immediate rash, facial/tongue/throat swelling, SOB or lightheadedness with hypotension: Face swelling and hives started first, then swelling of the throat  Has patient had a PCN reaction causing severe rash involving mucus membranes or skin necrosis: Yes  Has patient had a PCN reaction that required hospitalization: No  Has patient had a PCN reaction occurring within the last 10 years: Yes  If all of the above answers are "NO", then may proceed with Cephalospor  . Pneumococcal Vaccines Nausea And Vomiting  . Cashew Nut Oil   . Nitrofurantoin Macrocrystal Hives  . Peanut Oil   . Aspirin Itching and Rash  . Bactrim [Sulfamethoxazole-Trimethoprim] Hives, Itching and Rash  . Ciprofloxacin Hives, Itching and Rash  . Ibuprofen Rash  . Influenza Vaccines  Hives  . Ivp Dye [Iodinated Diagnostic Agents] Hives, Itching and Rash    Gives benadryl to counteract symptoms  . Latex Rash  . Macrobid [Nitrofurantoin Monohydrate Macrocrystals] Hives  . Shellfish Allergy Hives    Patient also allergic to seafood    Medications Reviewed Today    Reviewed by [name], MD (Physician) on 06/04/20 at 1123  Med List Status: <None>  Medication Order Taking? Sig Documenting Provider Last Dose Status Informant  Accu-Chek Softclix Lancets lancets [id number] Yes Use as instructed [name], [name], MD Taking Active   ACETAMINOPHEN PO [id number] Yes Take by mouth as needed (pain). [provider] Taking Active   albuterol (VENTOLIN HFA) 108 (90 Base) MCG/ACT inhaler [id number] Yes Inhale 1 puff into the lungs every 6 (six) hours as needed. [name], [name], MD Taking Active   ALPRAZolam Duanne Moron) 0.25 MG tablet [id number] Yes TAKE 1 TABLET BY MOUTH THREE TIMES DAILY AS NEEDED FOR ANXIETY [name], [name], MD Taking Active   amLODipine (NORVASC) 10 MG tablet [id number] Yes Take 1 tablet (10 mg total) by mouth daily. [name], [name], MD Taking Active   atorvastatin (LIPITOR) 80 MG tablet [id number] Yes Take 1 tablet (80 mg total) by mouth daily. [name], [name], MD Taking Active   Biotin 5000 MCG CAPS [id number] Yes Take 1 capsule by mouth daily. [provider] Taking Active   Continuous Blood Gluc Receiver (FREESTYLE LIBRE 14 DAY READER) DEVI [id number] Yes 1 each by Does not apply route daily. [name], [name], MD Taking Active   Continuous Blood Gluc Sensor (FREESTYLE LIBRE [place]) [place] [id number] Yes USE 1  ONCE DAILY [name], [name], MD Taking Active   cyclobenzaprine (FLEXERIL) 5 MG tablet [id number] Yes TAKE ONE TABLET BY MOUTH EVERYDAY AT  BEDTIME [name], [name], MD Taking Active   diclofenac sodium (VOLTAREN) 1 % GEL [id number] Yes Apply 2 g topically 4  (four) times daily. Caren Griffins, MD Taking Active            Med Note Louretta Shorten, APRIL   Tue Jun 04, 2020 11:06 AM)    dronedarone (MULTAQ) 400 MG tablet [id number]  TAKE 1 TABLET BY MOUTH TWICE DAILY WITH A MEAL Filled by Dr [name] [name], Will Hassell Done, MD  Active   ezetimibe (ZETIA) 10 MG tablet [id number] Yes Take 1 tablet (10 mg total) by mouth daily. [name], [name], MD Taking Active   furosemide (LASIX) 40 MG tablet [id number] Yes Take 1/2 (one-half) tablet by mouth once daily [name], [name], MD Taking Active   glucose blood (ACCU-CHEK AVIVA PLUS) test strip [id number] Yes Use once daily for glucose control.  Dx E11.9  For accu-chek Allen Norris, [name], MD Taking Active   glucose blood (ACCU-CHEK GUIDE) test strip [id number] Yes Use as instructed [name], [name], MD Taking Active   hydrochlorothiazide (HYDRODIURIL) 25 MG tablet [id number] Yes Take 1 tablet (25 mg total) by mouth daily. [name], [name], MD Taking Active   HYDROcodone-acetaminophen (NORCO/VICODIN) 5-325 MG tablet [id number] Yes Take 1 tablet by mouth every 6 (six) hours as needed for moderate pain. [name], [name], MD Taking Active   HYDROcodone-acetaminophen (NORCO/VICODIN) 5-325 MG tablet [id number] Yes Take 1 tablet by mouth every 6 (six) hours as needed for moderate pain. [name], [name], MD Taking Active   HYDROcodone-acetaminophen (NORCO/VICODIN) 5-325 MG tablet [id number] Yes Take 1 tablet by mouth every 6 (six) hours as needed for moderate pain. [name], [name], MD Taking Active   Lactobacillus (ACIDOPHILUS) 0.5 MG TABS [id number] Yes Take 1 tablet by mouth daily. [provider] Taking Active   lactulose (CONSTULOSE) 10 GM/15ML solution [id number] Yes TAKE 30 MILLILITERS BY MOUTH  THREE TIMES DAILY AS NEEDED FOR MILD CONSTIPATION [name], [name], MD Taking Active   lidocaine (LIDODERM) 5 % [id number] Yes Place 1  patch onto the skin daily. Remove & Discard patch within 12 hours or as directed by MD Quintella Reichert, MD Taking Active   losartan (COZAAR) 100 MG tablet [id number] Yes Take 1 tablet (100 mg total) by mouth daily. [name], [name], MD Taking Active   metFORMIN (GLUCOPHAGE-XR) 500 MG 24 hr tablet [id number] Yes Take 500 mg by mouth daily with breakfast. [provider] Taking Active   Pirfenidone (ESBRIET) 801 MG TABS [id number] Yes Take 1 tablet by mouth 3 (three) times daily. [name], MD Taking Active   polyethylene glycol powder (GLYCOLAX/MIRALAX) powder [id number] Yes Take 17 g by mouth daily. [name], [name], MD Taking Active            Med Note [name], Elease Etienne Dec 13, 2019 10:34 AM)    rivaroxaban (XARELTO) 20 MG TABS tablet [id number]  TAKE 1 TABLET BY MOUTH ONCE DAILY WITH SUPPER [name], Will Hassell Done, MD  Active   senna (SENOKOT) 8.6 MG TABS tablet [id number] Yes Take 1 tablet by mouth daily as needed for mild constipation. [provider] Taking Active           Patient Active Problem List   Diagnosis Date Noted  . Vitamin D deficiency 12/05/2019  . Healthcare maintenance 11/09/2018  . IPF (idiopathic pulmonary fibrosis) ([place]) 11/09/2018  . Therapeutic drug monitoring 04/20/2018  .  Paroxysmal atrial fibrillation ([place]) 04/20/2018  . Atrial fibrillation with RVR ([place]) 11/03/2017  . LVH (left ventricular hypertrophy) 09/22/2017  . Diabetes mellitus without complication ([place]) 65/99/3570  . Degeneration of lumbar intervertebral disc 07/06/2017  . Pulmonary emboli ([place]) 05/02/2016  . Right leg pain 05/01/2016  . ILD (interstitial lung disease) ([place]) 05/01/2016  . Bradycardia 05/01/2016  . Coronary artery calcification seen on CAT scan 08/06/2015  . Open angle with borderline findings, low risk, bilateral 04/29/2015  . UIP (usual interstitial pneumonitis) ([place]) 04/05/2015  . Bilateral posterior capsular opacification 09/21/2014   . Pseudophakia of both eyes 07/27/2014  . Combined form of senile cataract of both eyes 05/30/2014  . Osteoarthritis 09/17/2011  . Narrowing of intervertebral disc space 09/17/2011  . Pyelonephritis with possible nonobstructing 5 mm calculus by renal ultrasound 09/16/2011  . Hypokalemia 09/16/2011  . External hemorrhoids 09/13/2011  . Obstructive sleep apnea syndrome 06/10/2011  . Anal fissure 02/17/2011  . History of colonic polyps 04/03/2010  . Respiratory condition due to chemical fumes and vapors ([place]) 01/20/2010  . Urinary incontinence 04/30/2009  . Insomnia 09/28/2007  . Asthma 11/22/2006  . Dyslipidemia 08/19/2006  . HTN (hypertension) 08/19/2006  . GERD 08/19/2006  . Low back pain 08/19/2006    Immunization History  Administered Date(s) Administered  . Fluad Quad(high Dose 65+) 11/29/2019  . PFIZER(Purple Top)SARS-COV-2 Vaccination 04/21/2019, 05/12/2019, 12/15/2019  . PPD Test 11/19/2010  . Pneumococcal Polysaccharide-23 11/29/2019   Patient was nervous as she was running low on some medications and didn't know how to get them refilled with [name]. Provided CPA and CPP's phone numbers and recommended she get in touch through that way. Patient is willing to try with [name] again as she really wants to get into adherence packaging and is having trouble getting her pillbox organized and was missing some medications.  Conditions to be addressed/monitored:  Hypertension, Hyperlipidemia, Diabetes, Atrial Fibrillation, Asthma, Anxiety and fluid retention, back pain, constipation, IPF  Care Plan : [place]  Updates made by [name], [place] since 06/13/2020 12:00 AM    Problem: Problem: Hypertension, Hyperlipidemia, Diabetes, Atrial Fibrillation, Asthma, Anxiety and fluid retention, back pain, constipation, IPF     Long-Range Goal: Patient-Specific Goal   Start Date: 06/10/2020  Expected End Date: 05/13/2021  This Visit's Progress: On track  Priority:  High  Note:   Current Barriers:  . Unable to independently monitor therapeutic efficacy . Unable to self administer medications as prescribed  Pharmacist Clinical Goal(s):  Marland Kitchen Patient will achieve adherence to monitoring guidelines and medication adherence to achieve therapeutic efficacy . achieve ability to self administer medications as prescribed through use of adherence packaging as evidenced by patient report through collaboration with PharmD and provider.   Interventions: . 1:1 collaboration with [name], [name], MD regarding development and update of comprehensive plan of care as evidenced by provider attestation and co-signature . Inter-disciplinary care team collaboration (see longitudinal plan of care) . Comprehensive medication review performed; medication list updated in electronic medical record  Hypertension (BP goal <140/90) -Controlled -Current treatment:  Amlodipine 56m, 1 tablet once daily  Hydrochlorothiazide 23m 1 tablet once daily  Losartan 10050m1 tablet once daily -Medications previously tried: benazepril, metoprolol  -Current home readings: 122/61, 116/60, 122/62, 129/61, 133/50 (home BP machine says 1/11 as the date for today) -Current dietary habits: patient reports working on it and has cut out salt and pork; patient tries to eat a lot more fish and vegetables -Current exercise habits: patient  reports she walks most days; recommended goal of 150 min/week  -Denies hypotensive/hypertensive symptoms -Educated on Daily salt intake goal < 2300 mg; Exercise goal of 150 minutes per week; Importance of home blood pressure monitoring; -Counseled to monitor BP at home at least weekly, document, and provide log at future appointments -Counseled on diet and exercise extensively Recommended to continue current medication  Hyperlipidemia: (LDL goal < 70) -Uncontrolled -Current treatment:  Atorvastatin 4m, 1 tablet once daily  Ezetimibe 10 mg, 1  tablet once daily -Medications previously tried: lovastatin  -Current dietary patterns: patient tries to eat a lot more fish and vegetables -Current exercise habits:  patient reports she walks most days; recommended goal of 150 min/week  -Educated on Cholesterol goals;  Benefits of statin for ASCVD risk reduction; Importance of limiting foods high in cholesterol; Exercise goal of 150 minutes per week; -Counseled on diet and exercise extensively Recommended to continue current medication  Diabetes (A1c goal <7%) -Controlled -Current medications: .Marland KitchenMetformin XR 500 mg 1 tablet before breakfast -Medications previously tried: none  -Current home glucose readings: reviewed CGM data and patient is consistently having lows in the morning (7% of BG readings were < 70).  . fasting glucose: n/a . post prandial glucose: n/a -Denies hypoglycemic/hyperglycemic symptoms -Current meal patterns:  . breakfast: n/a  . lunch: n/a  . dinner: n/a . snacks: n/a . drinks: n/a -Current exercise: walking daily -Educated on Exercise goal of 150 minutes per week; Prevention and management of hypoglycemic episodes; Continuous glucose monitoring; Carbohydrate counting and/or plate method -Counseled to check feet daily and get yearly eye exams -Collaborated with PCP about trial off of metformin to see if the low BGs stop  Atrial Fibrillation (Goal: prevent stroke and major bleeding) -Controlled -CHADSVASC: 5 -Current treatment: . Rate control: Multaq 400 mg 1 tablet twice daily with a meal . Anticoagulation: Xarelto 20 mg 1 tablet daily with dinner -Medications previously tried: none -Home BP and HR readings: 122/61, 116/60, 122/62, 129/61, 133/50  -Counseled on  the importance of taking both medications with a meal -Recommended to continue current medication  Asthma/IPF (Goal: control symptoms and prevent exacerbations) -Controlled -Current treatment   Albuterol HFA 1032m, act inhaler, inhale 1  puff every six hours as needed   Pirfenidone 80120m1 tablet in the morning, at noon, and at bedtime -Medications previously tried: none  -Patient denies consistent use of maintenance inhaler -Frequency of rescue inhaler use: as needed -Counseled on Proper inhaler technique; When to use rescue inhaler -Recommended to continue current medication   Fluid retention (Goal: minimize swelling) -Controlled -Current treatment  . Furosemide 45m46m5 tablet once daily -Medications previously tried: none  -Recommended to continue current medication Counseled on limiting salt intake to prevent fluid retention  Back pain (Goal: minimize symptoms of pain) -Controlled -Current treatment   Hydrocodone/APAP 10/325mg68mtablet every six hours as needed (takes as indicated)  Cyclobenzaprine 5mg,143mblet at bedtime   Diclofenac 1% gel, apply 2 g four times daily   APAP 500 mg, 1 tablet as needed for pain -Medications previously tried: meloxicam, naproxen, oxycodone  -Recommended to continue current medication  Anxiety (Goal: minimize symptoms) -Not ideally controlled -Current treatment: . Alprazolam 0.25mg, 33mablet three times daily as needed for anxiety -Medications previously tried/failed: none -GAD7: n/a -Educated on Benefits of medication for symptom control -Recommended discussion with PCP about using something more stable and safer in older adults  Constipation (Goal: normal bowel movements) -Controlled -Current treatment   docusate 100mg,27m  1 capsule twice daily  Lactulose solution, take 30 ML three times daily as needed for mild constipation  Miralax, 17g one daily (not a part of daily regimen) -Medications previously tried: bisacodyl, Linzess (previous loose stools)  -Recommended to continue current medication   Health Maintenance -Vaccine gaps: shingles, tetanus -Current therapy:  . Biotin 5000 mcg 1 capsule daily -Educated on Cost vs benefit of each product must be  carefully weighed by individual consumer -Patient is satisfied with current therapy and denies issues -Recommended to continue current medication  Patient Goals/Self-Care Activities . Patient will:  - take medications as prescribed check blood pressure at least weekly, document, and provide at future appointments target a minimum of 150 minutes of moderate intensity exercise weekly  Follow Up Plan: Telephone follow up appointment with care management team member scheduled for: 3 months       Medication Assistance: None required.  Patient affirms current coverage meets needs.  Patient's preferred pharmacy is:  [facility] [address] ([address] [place] [address] [place]) [place] [address] Phone: [phone] Fax: [phone]  [place], [place] [address] Phone: [phone] Fax: [phone]  [name] - [place], [place] - [address] [place] [address] Phone: [phone] Fax: [phone]  Uses pill box? Yes Pt endorses 80% compliance - timing of some medications was off and she accidentally stopped taking atorvastatin as she was unsure where it was  We discussed: Benefits of medication synchronization, packaging and delivery as well as enhanced pharmacist oversight with [name]. Patient decided to: Utilize [name] pharmacy for medication synchronization, packaging and delivery  Care Plan and Follow Up Patient Decision:  Patient agrees to Care Plan and Follow-up.  Plan: Telephone follow up appointment with care management team member scheduled for:  3 months  [name], PharmD [place] Pharmacist [place] at [place] [phone]

## 2020-06-19 ENCOUNTER — Telehealth: Payer: Self-pay | Admitting: Internal Medicine

## 2020-06-19 ENCOUNTER — Other Ambulatory Visit: Payer: Self-pay

## 2020-06-19 MED ORDER — FREESTYLE LIBRE 14 DAY SENSOR MISC: 4 refills | Status: DC

## 2020-06-19 NOTE — Telephone Encounter (Signed)
Patient needs a refill for her [facility] [place] sensors.  She is out right now, she ran out yesterday.   Pharmacy- [facility] on [address]

## 2020-06-19 NOTE — Telephone Encounter (Signed)
Refill sent.

## 2020-06-20 ENCOUNTER — Encounter: Payer: Self-pay | Admitting: Internal Medicine

## 2020-06-20 ENCOUNTER — Encounter: Payer: HMO | Admitting: Internal Medicine

## 2020-06-20 ENCOUNTER — Telehealth: Payer: Self-pay | Admitting: Pharmacist

## 2020-06-20 ENCOUNTER — Telehealth: Payer: HMO

## 2020-06-20 VITALS — BP 150/80 | HR 74 | Temp 98.4°F | Wt 178.3 lb

## 2020-06-20 LAB — POCT GLYCOSYLATED HEMOGLOBIN (HGB A1C): Hemoglobin A1C: 5.8 % — AB (ref 4.0–5.6)

## 2020-06-20 NOTE — Chronic Care Management (AMB) (Signed)
Chronic Care Management Pharmacy Assistant   Name: Miranda [name]  MRN: [id number] DOB: 10-Sep-1945   Reason for Encounter: Medication Review-Medication Coordination Call   Recent office visits:  . 04.07.2022 [name], [name], MD Internal Medicine  Recent consult visits:  None  Hospital visits:  None in previous 6 months  Medications: Outpatient Encounter Medications as of 06/20/2020  Medication Sig  . Accu-Chek Softclix Lancets lancets Use as instructed  . ACETAMINOPHEN PO Take by mouth as needed (pain).  Marland Kitchen albuterol (VENTOLIN HFA) 108 (90 Base) MCG/ACT inhaler Inhale 1 puff into the lungs every 6 (six) hours as needed.  . ALPRAZolam (XANAX) 0.25 MG tablet TAKE 1 TABLET BY MOUTH THREE TIMES DAILY AS NEEDED FOR ANXIETY  . amLODipine (NORVASC) 10 MG tablet Take 1 tablet (10 mg total) by mouth daily.  Marland Kitchen atorvastatin (LIPITOR) 80 MG tablet Take 1 tablet (80 mg total) by mouth daily.  . Biotin 5000 MCG CAPS Take 1 capsule by mouth daily.  . Continuous Blood Gluc Receiver (FREESTYLE LIBRE 14 DAY READER) DEVI 1 each by Does not apply route daily.  . Continuous Blood Gluc Sensor (FREESTYLE LIBRE 14 DAY SENSOR) MISC USE 1  ONCE DAILY  . cyclobenzaprine (FLEXERIL) 5 MG tablet TAKE ONE TABLET BY MOUTH EVERYDAY AT BEDTIME  . diclofenac sodium (VOLTAREN) 1 % GEL Apply 2 g topically 4 (four) times daily.  Marland Kitchen dronedarone (MULTAQ) 400 MG tablet TAKE 1 TABLET BY MOUTH TWICE DAILY WITH A MEAL  . ezetimibe (ZETIA) 10 MG tablet Take 1 tablet (10 mg total) by mouth daily.  . furosemide (LASIX) 40 MG tablet Take 1/2 (one-half) tablet by mouth once daily  . glucose blood (ACCU-CHEK AVIVA PLUS) test strip Use once daily for glucose control.  Dx E11.9  For accu-chek aviva  . glucose blood (ACCU-CHEK GUIDE) test strip Use as instructed  . hydrochlorothiazide (HYDRODIURIL) 25 MG tablet Take 1 tablet (25 mg total) by mouth daily.  Marland Kitchen HYDROcodone-acetaminophen (NORCO/VICODIN) 5-325 MG tablet Take  1 tablet by mouth every 6 (six) hours as needed for moderate pain.  Marland Kitchen HYDROcodone-acetaminophen (NORCO/VICODIN) 5-325 MG tablet Take 1 tablet by mouth every 6 (six) hours as needed for moderate pain.  Marland Kitchen HYDROcodone-acetaminophen (NORCO/VICODIN) 5-325 MG tablet Take 1 tablet by mouth every 6 (six) hours as needed for moderate pain.  . Lactobacillus (ACIDOPHILUS) 0.5 MG TABS Take 1 tablet by mouth daily.  Marland Kitchen lactulose (CONSTULOSE) 10 GM/15ML solution TAKE 30 MILLILITERS BY MOUTH  THREE TIMES DAILY AS NEEDED FOR MILD CONSTIPATION  . lidocaine (LIDODERM) 5 % Place 1 patch onto the skin daily. Remove & Discard patch within 12 hours or as directed by MD  . losartan (COZAAR) 100 MG tablet Take 1 tablet (100 mg total) by mouth daily.  . metFORMIN (GLUCOPHAGE-XR) 500 MG 24 hr tablet Take 500 mg by mouth daily with breakfast.  . Pirfenidone (ESBRIET) 801 MG TABS Take 1 tablet by mouth 3 (three) times daily.  . polyethylene glycol powder (GLYCOLAX/MIRALAX) powder Take 17 g by mouth daily.  . rivaroxaban (XARELTO) 20 MG TABS tablet TAKE 1 TABLET BY MOUTH ONCE DAILY WITH SUPPER  . senna (SENOKOT) 8.6 MG TABS tablet Take 1 tablet by mouth daily as needed for mild constipation.   No facility-administered encounter medications on file as of 06/20/2020.   Reviewed chart for medication changes ahead of medication coordination call.  BP Readings from Last 3 Encounters:  06/20/20 (!) 150/80  06/04/20 (!) 112/56  05/16/20 130/80  Lab Results  Component Value Date   HGBA1C 5.8 (A) 06/20/2020    Patient obtains medications through V[facility]  90 Days  Last adherence delivery included:  Marland Kitchen Hydrochlorothiazide (HYDRODIURIL) 25 MG tablet: one daily . Rivaroxaban (XARELTO) 20 MG TABS tablet: One tablet at supper . Losartan (COZAAR) 100 MG tablet: one tablet daily . Furosemide (LASIX) 40 MG tablet Take 1/2 (one-half) tablet by mouth once daily . Amlodipine (NORVASC) 10 MG tablet: one tablet daily  Patient declined  the following medication last month due to PRN use/additional supply on hand. . Cyclobenzaprine (FLEXERIL) 5 MG tablet: one tablet at bedtime . Ezetimibe (ZETIA) 10 MG tablet; one tablet daily . Dronedarone (MULTAQ) 400 MG tablet: one tablet twice daily . FREESTYLE LIBRE 14 DAY READER) DEVI . Continuous Blood Gluc Sensor (FREESTYLE LIBRE 14 DAY SENSOR) MISC . Alprazolam (XANAX) 0.25 MG tablet: one tablet by month three times a day  I spoke with the patient and review medications. There are no changes in medications currently. Patient declined these medication this month due to PRN use/additional supply on hand.The patient is taking the following medications: Last filled at [facility] for 90 days on 03.22.22  . Hydrochlorothiazide (HYDRODIURIL) 25 MG tablet: one daily . Atorvastatin (LIPITOR) 80 MG tablet one tablet daily . Rivaroxaban (XARELTO) 20 MG TABS tablet: One tablet at supper . Furosemide (LASIX) 40 MG tablet Take 1/2 (one-half) tablet by mouth once daily . Amlodipine (NORVASC) 10 MG tablet: one tablet daily . Ezetimibe (ZETIA) 10 MG tablet; one tablet daily . Dronedarone (MULTAQ) 400 MG tablet: one tablet twice daily . FREESTYLE LIBRE 14 DAY READER) DEVI . Continuous Blood Gluc Sensor (FREESTYLE LIBRE 14 DAY SENSOR) MISC . Alprazolam (XANAX) 0.25 MG tablet: one tablet by month three times a day  Patient will need a short fill of the following medication, prior to adherence delivery. (To align with sync date or if PRN med)  Fill for 63 days . Losartan (COZAAR) 100 MG tablet: one tablet daily . Cyclobenzaprine (FLEXERIL) 5 MG tablet: one tablet at bedtime  She currently does not need refills.  Confirmed delivery date of 06/27/2020, advised patient that pharmacy will contact them the morning of delivery.  Star Rating Drugs:  Dispensed Quantity Pharmacy  Atorvastatin 04.01.22 [name]   Amilia Revonda Standard, [place] [phone]

## 2020-07-18 ENCOUNTER — Other Ambulatory Visit: Payer: Self-pay

## 2020-07-19 ENCOUNTER — Ambulatory Visit (INDEPENDENT_AMBULATORY_CARE_PROVIDER_SITE_OTHER): Payer: HMO | Admitting: Internal Medicine

## 2020-07-19 VITALS — BP 150/60 | HR 61 | Temp 97.7°F | Ht 65.0 in | Wt 179.6 lb

## 2020-07-19 DIAGNOSIS — R198 Other specified symptoms and signs involving the digestive system and abdomen: Secondary | ICD-10-CM | POA: Diagnosis not present

## 2020-07-19 DIAGNOSIS — M5412 Radiculopathy, cervical region: Secondary | ICD-10-CM

## 2020-07-19 NOTE — Progress Notes (Signed)
Acute office Visit     This visit occurred during the SARS-CoV-2 public health emergency.  Safety protocols were in place, including screening questions prior to the visit, additional usage of staff PPE, and extensive cleaning of exam room while observing appropriate contact time as indicated for disinfecting solutions.    CC/Reason for Visit: Discuss some acute concerns  HPI: Miranda [name] is a 75 y.o. female who is coming in today for the above mentioned reasons.  She is here today to discuss some acute issues:  1.  She is having tenesmus.  It is so severe that she is having back pain when she strains on the toilet.  She states she has about 2 bowel movements a week.  We had talked about her constipation in the past.  She was advised to take MiraLAX and to aim for soft bowel movement every other day.  She tells me she is only taking the MiraLAX once to twice a week.  2.  She has been having some tingling of her right arm.  She states that the tingling starts over the right side of her neck and travels all the way down the lateral part of her arm and in the dorsal aspect of her forearm and into her fingertips.  This started about 2 weeks ago.  No injury that she can recall.  She does have some neck pain at times.  Past Medical/Surgical History: Past Medical History:  Diagnosis Date  . Anxiety   . Arthritis   . Asthma   . Atrial fibrillation ([place])   . CHF (congestive heart failure) ([place])    pt. unsure- but thinks she was hosp. for CHF- 2002  . [facility]  . Clotting disorder ([place])    blood clots in lungs/PE pulmonary embolism  . Colon polyps   . Complication of anesthesia    states requires a lot med. to put her to sleep   . DDD (degenerative disc disease) 09/17/2011  . Depression    "sometimes "  . DM (diabetes mellitus) ([place])   . Family history of anesthesia complication   . GERD (gastroesophageal reflux disease)   . Glaucoma     bilateral, pt. admits that she is noncompliant to eye gtts.   . Hemorrhoids   . Hyperlipidemia   . Hypertension    had stress, echo- 2006 /w [facility], Cardiac Cath, per pt. 2002, echo repeated 2012- [facility]   . Low back pain   . Shortness of breath   . Sleep apnea    uses c-pap- q night recently    Past Surgical History:  Procedure Laterality Date  . ABDOMINAL HYSTERECTOMY     ectopic, fibroids  . arm surgery Right   . CARDIAC CATHETERIZATION    . CATARACT EXTRACTION, BILATERAL     cataracts removed bilateral- ?IOL  . COLONOSCOPY     remote  . FISSURECTOMY  10/08/2011   Procedure: FISSURECTOMY;  Surgeon: Stark Klein, MD;  Location: [facility];  Service: General;  Laterality: N/A;  . FLEXIBLE SIGMOIDOSCOPY  02/25/2011   Procedure: FLEXIBLE SIGMOIDOSCOPY;  Surgeon: Inda Castle, MD;  Location: [facility];  Service: Endoscopy;  Laterality: N/A;  . FOOT SURGERY     bilat, heel spurs- screw in R foot   . HEMORRHOID SURGERY  10/08/2011   Procedure: HEMORRHOIDECTOMY;  Surgeon: Stark Klein, MD;  Location: [facility];  Service: General;  Laterality: N/A;  External   .  SPHINCTEROTOMY  10/08/2011   Procedure: SPHINCTEROTOMY;  Surgeon: Stark Klein, MD;  Location: [facility];  Service: General;  Laterality: N/A;    Social History:  reports that she has never smoked. She has never used smokeless tobacco. She reports that she does not drink alcohol and does not use drugs.  Allergies: Allergies  Allergen Reactions  . Fish Oil Anaphylaxis  . Other Hives, Shortness Of Breath and Swelling    Allergic to cashew nuts and peanut oil.  Marland Kitchen Penicillins Anaphylaxis, Hives and Swelling    Has patient had a PCN reaction causing immediate rash, facial/tongue/throat swelling, SOB or lightheadedness with hypotension: Face swelling and hives started first, then swelling of the throat  Has patient had a PCN reaction causing severe rash involving mucus membranes or skin necrosis: Yes  Has patient had a PCN reaction that  required hospitalization: No  Has patient had a PCN reaction occurring within the last 10 years: Yes  If all of the above answers are "NO", then may proceed with Cephalospor  . Pneumococcal Vaccines Nausea And Vomiting  . Cashew Nut Oil   . Nitrofurantoin Macrocrystal Hives  . Peanut Oil   . Aspirin Itching and Rash  . Bactrim [Sulfamethoxazole-Trimethoprim] Hives, Itching and Rash  . Ciprofloxacin Hives, Itching and Rash  . Ibuprofen Rash  . Influenza Vaccines Hives  . Ivp Dye [Iodinated Diagnostic Agents] Hives, Itching and Rash    Gives benadryl to counteract symptoms  . Latex Rash  . Macrobid [Nitrofurantoin Monohydrate Macrocrystals] Hives  . Shellfish Allergy Hives    Patient also allergic to seafood    Family History:  Family History  Problem Relation Age of Onset  . Heart attack Mother   . Heart disease Mother   . Breast cancer Mother   . Emphysema Sister   . Breast cancer Sister   . Arthritis/Rheumatoid Sister   . Asthma Sister   . Lung cancer Sister   . COPD Sister   . Colon cancer Brother   . Colon cancer Brother   . Anesthesia problems Neg Hx      Current Outpatient Medications:  .  Accu-Chek Softclix Lancets lancets, Use as instructed, Disp: 100 each, Rfl: 12 .  ACETAMINOPHEN PO, Take by mouth as needed (pain)., Disp: , Rfl:  .  albuterol (VENTOLIN HFA) 108 (90 Base) MCG/ACT inhaler, Inhale 1 puff into the lungs every 6 (six) hours as needed., Disp: 18 g, Rfl: 1 .  ALPRAZolam (XANAX) 0.25 MG tablet, TAKE 1 TABLET BY MOUTH THREE TIMES DAILY AS NEEDED FOR ANXIETY, Disp: 90 tablet, Rfl: 0 .  amLODipine (NORVASC) 10 MG tablet, Take 1 tablet (10 mg total) by mouth daily., Disp: 90 tablet, Rfl: 1 .  atorvastatin (LIPITOR) 80 MG tablet, Take 1 tablet (80 mg total) by mouth daily., Disp: 90 tablet, Rfl: 1 .  Biotin 5000 MCG CAPS, Take 1 capsule by mouth daily., Disp: , Rfl:  .  Continuous Blood Gluc Receiver (FREESTYLE LIBRE 14 DAY READER) DEVI, 1 each by Does not  apply route daily., Disp: 1 each, Rfl: 2 .  Continuous Blood Gluc Sensor (FREESTYLE LIBRE 14 DAY SENSOR) MISC, USE 1  ONCE DAILY, Disp: 2 each, Rfl: 4 .  cyclobenzaprine (FLEXERIL) 5 MG tablet, TAKE ONE TABLET BY MOUTH EVERYDAY AT BEDTIME, Disp: 71 tablet, Rfl: 1 .  diclofenac sodium (VOLTAREN) 1 % GEL, Apply 2 g topically 4 (four) times daily., Disp: 1 Tube, Rfl: 1 .  dronedarone (MULTAQ) 400 MG tablet, TAKE 1 TABLET  BY MOUTH TWICE DAILY WITH A MEAL, Disp: 180 tablet, Rfl: 1 .  ezetimibe (ZETIA) 10 MG tablet, Take 1 tablet (10 mg total) by mouth daily., Disp: 90 tablet, Rfl: 1 .  furosemide (LASIX) 40 MG tablet, Take 1/2 (one-half) tablet by mouth once daily, Disp: 45 tablet, Rfl: 1 .  glucose blood (ACCU-CHEK AVIVA PLUS) test strip, Use once daily for glucose control.  Dx E11.9  For accu-chek aviva, Disp: 100 each, Rfl: 12 .  glucose blood (ACCU-CHEK GUIDE) test strip, Use as instructed, Disp: 100 each, Rfl: 12 .  hydrochlorothiazide (HYDRODIURIL) 25 MG tablet, Take 1 tablet (25 mg total) by mouth daily., Disp: 90 tablet, Rfl: 1 .  HYDROcodone-acetaminophen (NORCO/VICODIN) 5-325 MG tablet, Take 1 tablet by mouth every 6 (six) hours as needed for moderate pain., Disp: 90 tablet, Rfl: 0 .  HYDROcodone-acetaminophen (NORCO/VICODIN) 5-325 MG tablet, Take 1 tablet by mouth every 6 (six) hours as needed for moderate pain., Disp: 90 tablet, Rfl: 0 .  HYDROcodone-acetaminophen (NORCO/VICODIN) 5-325 MG tablet, Take 1 tablet by mouth every 6 (six) hours as needed for moderate pain., Disp: 90 tablet, Rfl: 0 .  Lactobacillus (ACIDOPHILUS) 0.5 MG TABS, Take 1 tablet by mouth daily., Disp: , Rfl:  .  lactulose (CONSTULOSE) 10 GM/15ML solution, TAKE 30 MILLILITERS BY MOUTH  THREE TIMES DAILY AS NEEDED FOR MILD CONSTIPATION, Disp: 473 mL, Rfl: 0 .  lidocaine (LIDODERM) 5 %, Place 1 patch onto the skin daily. Remove & Discard patch within 12 hours or as directed by MD, Disp: 15 patch, Rfl: 0 .  losartan (COZAAR)  100 MG tablet, Take 1 tablet (100 mg total) by mouth daily., Disp: 90 tablet, Rfl: 1 .  metFORMIN (GLUCOPHAGE-XR) 500 MG 24 hr tablet, Take 500 mg by mouth daily with breakfast., Disp: , Rfl:  .  Pirfenidone (ESBRIET) 801 MG TABS, Take 1 tablet by mouth 3 (three) times daily., Disp: 90 tablet, Rfl: 10 .  polyethylene glycol powder (GLYCOLAX/MIRALAX) powder, Take 17 g by mouth daily., Disp: 3350 g, Rfl: 1 .  rivaroxaban (XARELTO) 20 MG TABS tablet, TAKE 1 TABLET BY MOUTH ONCE DAILY WITH SUPPER, Disp: 90 tablet, Rfl: 1 .  senna (SENOKOT) 8.6 MG TABS tablet, Take 1 tablet by mouth daily as needed for mild constipation., Disp: , Rfl:   Review of Systems:  Constitutional: Denies fever, chills, diaphoresis, appetite change and fatigue.  HEENT: Denies photophobia, eye pain, redness, hearing loss, ear pain, congestion, sore throat, rhinorrhea, sneezing, mouth sores, trouble swallowing, neck pain, neck stiffness and tinnitus.   Respiratory: Denies SOB, DOE, cough, chest tightness,  and wheezing.   Cardiovascular: Denies chest pain, palpitations and leg swelling.  Gastrointestinal: Denies nausea, vomiting,  diarrhea, blood in stool and abdominal distention.  Genitourinary: Denies dysuria, urgency, frequency, hematuria, flank pain and difficulty urinating.  Endocrine: Denies: hot or cold intolerance, sweats, changes in hair or nails, polyuria, polydipsia. Musculoskeletal: Denies myalgias, back pain, joint swelling, arthralgias and gait problem.  Skin: Denies pallor, rash and wound.  Neurological: Denies dizziness, seizures, syncope, weakness, light-headedness and headaches.  Hematological: Denies adenopathy. Easy bruising, personal or family bleeding history  Psychiatric/Behavioral: Denies suicidal ideation, mood changes, confusion, nervousness, sleep disturbance and agitation    Physical Exam: Vitals:   07/19/20 1023  BP: (!) 150/60  Pulse: 61  Temp: 97.7 F (36.5 C)  TempSrc: Oral  SpO2: 95%   Weight: 179 lb 9.6 oz (81.5 kg)  Height: 5\' 5"  (1.651 m)    Body mass index is  29.89 kg/m.   Constitutional: NAD, calm, comfortable Eyes: PERRL, lids and conjunctivae normal ENMT: Mucous membranes are moist.  Respiratory: clear to auscultation bilaterally, no wheezing, no crackles. Normal respiratory effort. No accessory muscle use.  Cardiovascular: Regular rate and rhythm, no murmurs / rubs / gallops. No extremity edema.  Psychiatric: Normal judgment and insight. Alert and oriented x 3. Normal mood.    Impression and Plan:  Cervical radiculopathy  -With her neck pain and right arm tingling, cervical radiculopathy is suspected. -I will order C-spine MRI, further work-up pending results  Tenesmus -We have again discussed the importance of a bowel regimen, especially now that she is on narcotics for her back issues. -She is advised to take MiraLAX daily to aim for soft bowel movement at a minimum of every other day. -She has been referred to GI for screening colonoscopy which she is overdue for.  Time spent: 32 minutes reviewing chart, discussing and examining patient, formulating plan of care and discussing recommendations.    [name], MD [facility]

## 2020-07-30 ENCOUNTER — Telehealth: Payer: Self-pay | Admitting: Pharmacist

## 2020-07-30 NOTE — Chronic Care Management (AMB) (Signed)
Chronic Care Management Pharmacy Assistant   Name: Miranda [name]  MRN: [id number] DOB: 1945-08-07  Reason for Encounter: Disease State/ Hypertension Assessment Call.    Conditions to be addressed/monitored: HTN  Recent office visits:  07/19/20 [name] MD ( PCP) - seen for cervical radiculopathy and tenusmus. No medication changes. No follow up noted.   06/20/20 Estela [name] MD ( PCP) - seen for diabetes. No medication changes. No follow up noted.   Recent consult visits:  None.   Hospital visits:  None in previous 6 months  Medications: Outpatient Encounter Medications as of 07/30/2020  Medication Sig  . Accu-Chek Softclix Lancets lancets Use as instructed  . ACETAMINOPHEN PO Take by mouth as needed (pain).  Marland Kitchen albuterol (VENTOLIN HFA) 108 (90 Base) MCG/ACT inhaler Inhale 1 puff into the lungs every 6 (six) hours as needed.  . ALPRAZolam (XANAX) 0.25 MG tablet TAKE 1 TABLET BY MOUTH THREE TIMES DAILY AS NEEDED FOR ANXIETY  . amLODipine (NORVASC) 10 MG tablet Take 1 tablet (10 mg total) by mouth daily.  Marland Kitchen atorvastatin (LIPITOR) 80 MG tablet Take 1 tablet (80 mg total) by mouth daily.  . Biotin 5000 MCG CAPS Take 1 capsule by mouth daily.  . Continuous Blood Gluc Receiver (FREESTYLE LIBRE 14 DAY READER) DEVI 1 each by Does not apply route daily.  . Continuous Blood Gluc Sensor (FREESTYLE LIBRE 14 DAY SENSOR) MISC USE 1  ONCE DAILY  . cyclobenzaprine (FLEXERIL) 5 MG tablet TAKE ONE TABLET BY MOUTH EVERYDAY AT BEDTIME  . diclofenac sodium (VOLTAREN) 1 % GEL Apply 2 g topically 4 (four) times daily.  Marland Kitchen dronedarone (MULTAQ) 400 MG tablet TAKE 1 TABLET BY MOUTH TWICE DAILY WITH A MEAL  . ezetimibe (ZETIA) 10 MG tablet Take 1 tablet (10 mg total) by mouth daily.  . furosemide (LASIX) 40 MG tablet Take 1/2 (one-half) tablet by mouth once daily  . glucose blood (ACCU-CHEK AVIVA PLUS) test strip Use once daily for glucose control.  Dx E11.9  For accu-chek aviva   . glucose blood (ACCU-CHEK GUIDE) test strip Use as instructed  . hydrochlorothiazide (HYDRODIURIL) 25 MG tablet Take 1 tablet (25 mg total) by mouth daily.  Marland Kitchen HYDROcodone-acetaminophen (NORCO/VICODIN) 5-325 MG tablet Take 1 tablet by mouth every 6 (six) hours as needed for moderate pain.  Marland Kitchen HYDROcodone-acetaminophen (NORCO/VICODIN) 5-325 MG tablet Take 1 tablet by mouth every 6 (six) hours as needed for moderate pain.  Marland Kitchen HYDROcodone-acetaminophen (NORCO/VICODIN) 5-325 MG tablet Take 1 tablet by mouth every 6 (six) hours as needed for moderate pain.  . Lactobacillus (ACIDOPHILUS) 0.5 MG TABS Take 1 tablet by mouth daily.  Marland Kitchen lactulose (CONSTULOSE) 10 GM/15ML solution TAKE 30 MILLILITERS BY MOUTH  THREE TIMES DAILY AS NEEDED FOR MILD CONSTIPATION  . lidocaine (LIDODERM) 5 % Place 1 patch onto the skin daily. Remove & Discard patch within 12 hours or as directed by MD  . losartan (COZAAR) 100 MG tablet Take 1 tablet (100 mg total) by mouth daily.  . metFORMIN (GLUCOPHAGE-XR) 500 MG 24 hr tablet Take 500 mg by mouth daily with breakfast.  . Pirfenidone (ESBRIET) 801 MG TABS Take 1 tablet by mouth 3 (three) times daily.  . polyethylene glycol powder (GLYCOLAX/MIRALAX) powder Take 17 g by mouth daily.  . rivaroxaban (XARELTO) 20 MG TABS tablet TAKE 1 TABLET BY MOUTH ONCE DAILY WITH SUPPER  . senna (SENOKOT) 8.6 MG TABS tablet Take 1 tablet by mouth daily as needed for mild constipation.  No facility-administered encounter medications on file as of 07/30/2020.    Reviewed chart prior to disease state call. Spoke with patient regarding BP  Recent Office Vitals: BP Readings from Last 3 Encounters:  07/19/20 (!) 150/60  06/20/20 (!) 150/80  06/04/20 (!) 112/56   Pulse Readings from Last 3 Encounters:  07/19/20 61  06/20/20 74  06/04/20 60    Wt Readings from Last 3 Encounters:  07/19/20 179 lb 9.6 oz (81.5 kg)  06/20/20 178 lb 4.8 oz (80.9 kg)  06/04/20 177 lb 6.4 oz (80.5 kg)     Kidney  Function Lab Results  Component Value Date/Time   CREATININE 0.99 02/05/2020 11:48 AM   CREATININE 0.93 12/01/2019 10:25 AM   CREATININE 0.89 10/12/2019 10:41 AM   GFR 75.22 10/12/2019 10:41 AM   GFRNONAA >60 02/05/2020 11:48 AM   GFRAA >60 11/04/2017 04:10 AM    BMP Latest Ref Rng & Units 02/05/2020 12/01/2019 10/12/2019  Glucose 70 - 99 mg/dL 95 85 96  BUN 8 - 23 mg/dL 16 16 10   Creatinine 0.44 - 1.00 mg/dL 0.99 0.93 0.89  BUN/Creat Ratio 6 - 22 (calc) - NOT APPLICABLE -  Sodium 623 - 145 mmol/L 138 136 138  Potassium 3.5 - 5.1 mmol/L 3.2(L) 4.1 3.4(L)  Chloride 98 - 111 mmol/L 99 99 101  CO2 22 - 32 mmol/L 27 23 32  Calcium 8.9 - 10.3 mg/dL 9.8 10.0 9.7    . Current antihypertensive regimen:   Amlodipine 10mg , 1 tablet once daily  Hydrochlorothiazide 25mg , 1 tablet once daily  Losartan 100mg , 1 tablet once daily  . How often are you checking your Blood Pressure?   . Current home BP readings:   . What recent interventions/DTPs have been made by any provider to improve Blood Pressure control since last CPP Visit: None.   . Any recent hospitalizations or ED visits since last visit with CPP? No  . What diet changes have been made to improve Blood Pressure Control?  o   . What exercise is being done to improve your Blood Pressure Control?  o   Adherence Review: Is the patient currently on ACE/ARB medication? Yes Does the patient have >5 day gap between last estimated fill dates? Yes  Multiple unsuccessful attempts to reach patient by phone.   Star Rating Drugs:  Losartan 100mg  - last filled on 06/26/20 63DS at [name] Metformin XR 500mg  - last filled on 02/08/19 90DS at [facility] Atorvastatin 80mg  - last filled on 06/14/20 80DS at [facility] [phone]

## 2020-07-31 ENCOUNTER — Ambulatory Visit (INDEPENDENT_AMBULATORY_CARE_PROVIDER_SITE_OTHER): Payer: HMO | Admitting: Internal Medicine

## 2020-07-31 ENCOUNTER — Other Ambulatory Visit: Payer: Self-pay

## 2020-07-31 ENCOUNTER — Encounter: Payer: Self-pay | Admitting: Internal Medicine

## 2020-07-31 VITALS — BP 110/68 | HR 60 | Temp 98.2°F | Wt 177.8 lb

## 2020-07-31 DIAGNOSIS — M5412 Radiculopathy, cervical region: Secondary | ICD-10-CM

## 2020-07-31 MED ORDER — METFORMIN HCL ER 500 MG PO TB24: 500.0000 mg | ORAL_TABLET | Freq: Every day | ORAL | 1 refills | Status: DC

## 2020-07-31 NOTE — Progress Notes (Signed)
Visit made in error, no charge.

## 2020-08-02 NOTE — Telephone Encounter (Cosign Needed)
Unable to leave vm mailbox full. 2nd attempt.

## 2020-08-05 NOTE — Telephone Encounter (Cosign Needed)
3rd attempt

## 2020-08-06 ENCOUNTER — Ambulatory Visit
Admission: RE | Admit: 2020-08-06 | Discharge: 2020-08-06 | Disposition: A | Discharge status: Home / Self Care | Payer: HMO | Source: Ambulatory Visit | Attending: Internal Medicine | Admitting: Internal Medicine

## 2020-08-06 ENCOUNTER — Other Ambulatory Visit: Payer: Self-pay

## 2020-08-06 DIAGNOSIS — M4802 Spinal stenosis, cervical region: Secondary | ICD-10-CM | POA: Diagnosis not present

## 2020-08-06 DIAGNOSIS — M5412 Radiculopathy, cervical region: Secondary | ICD-10-CM

## 2020-08-07 ENCOUNTER — Telehealth: Payer: Self-pay | Admitting: Internal Medicine

## 2020-08-07 NOTE — Telephone Encounter (Signed)
Suanne Marker is calling and is requesting a call back and wanted to know if patient had a diagnoses of diabetes and heart failure, please advise, CB is [phone]

## 2020-08-07 NOTE — Addendum Note (Signed)
Addended by: Alfredia Ferguson A on: 08/07/2020 09:37 AM   Modules accepted: Orders

## 2020-08-13 ENCOUNTER — Telehealth: Payer: Self-pay | Admitting: Cardiology

## 2020-08-13 ENCOUNTER — Other Ambulatory Visit: Payer: Self-pay | Admitting: Internal Medicine

## 2020-08-13 DIAGNOSIS — G8929 Other chronic pain: Secondary | ICD-10-CM

## 2020-08-13 DIAGNOSIS — G894 Chronic pain syndrome: Secondary | ICD-10-CM

## 2020-08-13 DIAGNOSIS — M545 Low back pain, unspecified: Secondary | ICD-10-CM

## 2020-08-13 NOTE — Telephone Encounter (Signed)
Hydrocodone due 08/16/20

## 2020-08-13 NOTE — Telephone Encounter (Signed)
Suanne Marker from [employer] company called to confirm rather or not patient has heart failure. Suanne Marker stated that if she doesn't answer her voicemail is confidential so you can state rather patient has it or not. Please advise

## 2020-08-13 NOTE — Telephone Encounter (Signed)
Pt is calling in stating that she needs a refill on Rx hydrocodone-acetaminophen ((NORCO/VICODIN) 5-325 MG and lactulose (CONSTULOSE) 10 MG  Pharm:  [facility] on [address].  Pt is wanting to make Dr. Jerilee Hoh aware that the two boxes of Freestyle Elenor Legato it was for 10 days instead of 14 days.  Pt stated that the 14 day sensor is at the pharmacy and she will pick it up.

## 2020-08-14 NOTE — Telephone Encounter (Signed)
Spoke to [place] with [employer].Patient does not have heart failure.

## 2020-08-15 MED ORDER — HYDROCODONE-ACETAMINOPHEN 5-325 MG PO TABS: 1.0000 | ORAL_TABLET | Freq: Four times a day (QID) | ORAL | 0 refills | Status: DC | PRN

## 2020-08-15 MED ORDER — LACTULOSE 10 GM/15ML PO SOLN: ORAL | 1 refills | Status: DC

## 2020-08-15 NOTE — Addendum Note (Signed)
Addended by: Westley Hummer B on: 08/15/2020 11:08 AM   Modules accepted: Orders

## 2020-08-19 ENCOUNTER — Telehealth: Payer: Self-pay | Admitting: Pharmacist

## 2020-08-19 NOTE — Chronic Care Management (AMB) (Signed)
    Chronic Care Management Pharmacy Assistant   Name: Miranda [name]  MRN: [id number] DOB: 10/10/1945  08/19/20- Patient called to remind of appointment with [name]) on (08/20/20 at 10am by phone)   Patient aware of appointment date, time, and type of appointment (either telephone or in person). Patient aware to have/bring all medications, supplements, blood pressure and/or blood sugar logs to visit.  Questions: Have you had any recent office visit or specialist visit outside of [name]?   No.   Are there any concerns you would like to discuss during your office visit?   Yes. Patient has been confused by all of [name] calls. Patient stated that [facility] and was given the wrong sensors for her current reader the first one was the 10 day sensors instead of 14 days. Patient got the new ones from [name] for the 14 day. She has been confused by all the calls between [name] and the [facility]. Patient is confused by all of the services she's been getting. I explained what we here at [name] and what [name] does and what we are here for. Patient was thankful for information.  Are you having any problems obtaining your medications? (Whether it pharmacy issues or cost)   Patient only had issues with her libre sensors.  If patient has any PAP medications ask if they are having any problems getting their PAP medication or refill?   PAP on all of her medications according to her.  Star Rating Drug:   Atorvastatin 80mg  - last filled on 06/14/20 80DS [facility]   Losartan 100mg  - last filled on 06/26/20 63DS [facility]  Metformin 500mg  - last filled on 07/31/20 90DS [facility]  Any gaps in medications fill history? No.  Notes: Had to explain to patient the benefits of the CCM program. Patient was stating that she has a ronda coming out to there house on Thursday and has been so confused by all of the services she's been  getting and she does not know what is for what and who is who.   [place]  Clinical Pharmacist Assistant [phone]

## 2020-08-20 ENCOUNTER — Ambulatory Visit (INDEPENDENT_AMBULATORY_CARE_PROVIDER_SITE_OTHER): Payer: HMO | Admitting: Pharmacist

## 2020-08-20 DIAGNOSIS — E119 Type 2 diabetes mellitus without complications: Secondary | ICD-10-CM | POA: Diagnosis not present

## 2020-08-20 DIAGNOSIS — I10 Essential (primary) hypertension: Secondary | ICD-10-CM

## 2020-08-20 NOTE — Progress Notes (Signed)
Chronic Care Management Pharmacy Note  08/26/2020 Name:  Miranda [name] MRN:  [id number] DOB:  1946/02/20   Summary: BP not at goal < 140/90 per last few office readings A1c at goal of < 7% LDL not at goal of < 70  Recommendations/Changes made from today's visit: -Recommend repeat lipid panel as this has not been checked since addition of Zetia -Recommended routine BP monitoring at home -Consider stopping metformin based on recent A1c and low BGs  Plan: Follow up in office to review CGM data and home BP readings  Subjective: Miranda [name] is an 75 y.o. year old female who is a primary patient of [name], [name], MD.  The CCM team was consulted for assistance with disease management and care coordination needs.    Engaged with patient face to face for follow up visit in response to provider referral for pharmacy case management and/or care coordination services.   Consent to Services:  The patient was given information about Chronic Care Management services, agreed to services, and gave verbal consent prior to initiation of services.  Please see initial visit note for detailed documentation.   Patient Care Team: [name], [name], MD as PCP - General (Internal Medicine) [name], [name], MD as PCP - Cardiology (Cardiology) [name], MD as PCP - Electrophysiology (Cardiology) [name], PA-C (Inactive) (Dermatology) [name], [facility] as Pharmacist (Pharmacist)  Recent office visits: 07/19/20 Domingo Mend, MD: Patient presented for tenesmus and cervical radiculopathy. Recommended taking Miralax daily. Ordered C-spine MRI and referred to GI for colonoscopy.  06/20/20 Domingo Mend, MD: Patient presented for DM follow up. A1c decreased to 5.8%.    05/16/20 Domingo Mend, MD: Patient presented for diabetes follow up. Entered pain contract with hydrocodone 5/325 mg to be taken 1 tablet every 4 hours as needed limited to 90  tabs/month.  04/04/20 Domingo Mend, MD: Patient presented for breast pain. Recommended diagnostic mammogram.  Recent consult visits: 06/04/20 Allegra Lai, MD (cardiology): Patient presented for Afib electrophysiology evaluation.  05/09/20 Levy Pupa, PA (emergeortho): Unable to access notes.  05/06/20 Brand Males, MD (pulmonary): Patient presented for IPF research encounter.  04/19/20 [name], MD (cardiology): Patient presented for Afib follow up.    12/20/19 Beatrix Fetters (optometry): Patient presented for diabetic eye exam.  10/23/19 - Pulmonology - Rexene Edison, NP - Patient presented for follow up for OSA and pulmonary fibrosis. Noted 77% compliance with CPAP.  Hospital visits: 02/05/20 Patient presented to the ED for chest wall pain.   01/07/20 Patient presented to the ED for a right toe fracture.  Objective:  Lab Results  Component Value Date   CREATININE 0.99 02/05/2020   BUN 16 02/05/2020   GFR 75.22 10/12/2019   GFRNONAA >60 02/05/2020   GFRAA >60 11/04/2017   NA 138 02/05/2020   K 3.2 (L) 02/05/2020   CALCIUM 9.8 02/05/2020   CO2 27 02/05/2020   GLUCOSE 95 02/05/2020    Lab Results  Component Value Date/Time   HGBA1C 5.8 (A) 06/20/2020 11:16 AM   HGBA1C 6.0 (A) 05/16/2020 11:13 AM   HGBA1C 6.1 12/01/2019 10:25 AM   HGBA1C 6.0 02/02/2018 12:20 PM   GFR 75.22 10/12/2019 10:41 AM   GFR 67.48 11/09/2018 10:20 AM    Last diabetic Eye exam: No results found for: HMDIABEYEEXA  Last diabetic Foot exam: No results found for: HMDIABFOOTEX   Lab Results  Component Value Date   CHOL 171 12/01/2019   HDL 65 12/01/2019  LDLCALC 90 12/01/2019   LDLDIRECT 152.8 03/30/2011   TRIG 70 12/01/2019   CHOLHDL 2.6 12/01/2019    Hepatic Function Latest Ref Rng & Units 12/01/2019 10/12/2019 11/09/2018  Total Protein 6.1 - 8.1 g/dL 8.1 7.8 8.2  Albumin 3.5 - 5.2 g/dL - 4.4 4.6  AST 10 - 35 U/L '20 21 20  ' ALT 6 - 29 U/L '18 20 25  ' Alk Phosphatase 39 - 117 U/L - 41  47  Total Bilirubin 0.2 - 1.2 mg/dL 0.7 0.6 0.5  Bilirubin, Direct 0.0 - 0.3 mg/dL - - -    Lab Results  Component Value Date/Time   TSH 1.45 12/01/2019 10:25 AM   TSH 1.499 09/14/2017 04:58 PM   TSH 0.86 06/01/2014 11:45 AM    CBC Latest Ref Rng & Units 02/05/2020 12/01/2019 11/02/2017  WBC 4.0 - 10.5 K/uL 5.8 6.3 -  Hemoglobin 12.0 - 15.0 g/dL 13.2 12.7 14.6  Hematocrit 36.0 - 46.0 % 41.5 37.5 43.0  Platelets 150 - 400 K/uL 201 189.0 -    Lab Results  Component Value Date/Time   VD25OH 14.58 (L) 12/01/2019 10:25 AM    Clinical ASCVD: No  The 10-year ASCVD risk score Mikey Bussing DC Jr., et al., 2013) is: 28.7%   Values used to calculate the score:     Age: 75 years     Sex: Female     Is Non-Hispanic African American: Yes     Diabetic: Yes     Tobacco smoker: No     Systolic Blood Pressure: 100 mmHg     Is BP treated: Yes     HDL Cholesterol: 65 mg/dL     Total Cholesterol: 171 mg/dL    Depression screen [facility] 2/9 11/29/2019 03/28/2019 01/02/2019  Decreased Interest 0 0 0  Down, Depressed, Hopeless 0 1 0  PHQ - 2 Score 0 1 0  Altered sleeping 1 - -  Tired, decreased energy 0 - -  Change in appetite 0 - -  Feeling bad or failure about yourself  0 - -  Trouble concentrating 0 - -  Moving slowly or fidgety/restless 0 - -  Suicidal thoughts 0 - -  PHQ-9 Score 1 - -  Some recent data might be hidden     CHA2DS2/VAS Stroke Risk Points  Current as of yesterday     5 >= 2 Points: High Risk  1 - 1.99 Points: Medium Risk  0 Points: Low Risk    Last Change: N/A      Details    This score determines the patient's risk of having a stroke if the  patient has atrial fibrillation.       Points Metrics  0 Has Congestive Heart Failure:  No    Current as of yesterday  1 Has Vascular Disease:  Yes    Current as of yesterday  1 Has Hypertension:  Yes    Current as of yesterday  1 Age:  75    Current as of yesterday  1 Has Diabetes:  Yes    Current as of yesterday  0 Had  Stroke:  No  Had TIA:  No  Had Thromboembolism:  No    Current as of yesterday  1 Female:  Yes    Current as of yesterday     Social History   Tobacco Use  Smoking Status Never  Smokeless Tobacco Never   BP Readings from Last 3 Encounters:  07/31/20 110/68  07/19/20 (!) 150/60  06/20/20 (!) 150/80  Pulse Readings from Last 3 Encounters:  07/31/20 60  07/19/20 61  06/20/20 74   Wt Readings from Last 3 Encounters:  07/31/20 177 lb 12.8 oz (80.6 kg)  07/19/20 179 lb 9.6 oz (81.5 kg)  06/20/20 178 lb 4.8 oz (80.9 kg)   BMI Readings from Last 3 Encounters:  07/31/20 29.59 kg/m  07/19/20 29.89 kg/m  06/20/20 29.67 kg/m    Assessment/Interventions: Review of patient past medical history, allergies, medications, health status, including review of consultants reports, laboratory and other test data, was performed as part of comprehensive evaluation and provision of chronic care management services.   SDOH:  (Social Determinants of Health) assessments and interventions performed: No  SDOH Screenings   Alcohol Screen: Not on file  Depression (PHQ2-9): Low Risk    PHQ-2 Score: 1  Financial Resource Strain: Not on file  Food Insecurity: Not on file  Housing: Not on file  Physical Activity: Not on file  Social Connections: Not on file  Stress: Not on file  Tobacco Use: Low Risk    Smoking Tobacco Use: Never   Smokeless Tobacco Use: Never  Transportation Needs: Not on file    CCM Care Plan  Allergies  Allergen Reactions   Fish Oil Anaphylaxis   Other Hives, Shortness Of Breath and Swelling    Allergic to cashew nuts and peanut oil.   Penicillins Anaphylaxis, Hives and Swelling    Has patient had a PCN reaction causing immediate rash, facial/tongue/throat swelling, SOB or lightheadedness with hypotension: Face swelling and hives started first, then swelling of the throat  Has patient had a PCN reaction causing severe rash involving mucus membranes or skin necrosis:  Yes  Has patient had a PCN reaction that required hospitalization: No  Has patient had a PCN reaction occurring within the last 10 years: Yes  If all of the above answers are "NO", then may proceed with Cephalospor   Pneumococcal Vaccines Nausea And Vomiting   Cashew Nut Oil    Nitrofurantoin Macrocrystal Hives   Peanut Oil    Aspirin Itching and Rash   Bactrim [Sulfamethoxazole-Trimethoprim] Hives, Itching and Rash   Ciprofloxacin Hives, Itching and Rash   Ibuprofen Rash   Influenza Vaccines Hives   Ivp Dye [Iodinated Diagnostic Agents] Hives, Itching and Rash    Gives benadryl to counteract symptoms   Latex Rash   Macrobid [Nitrofurantoin Monohydrate Macrocrystals] Hives   Shellfish Allergy Hives    Patient also allergic to seafood    Medications Reviewed Today     Reviewed by Bethann Punches, CMA (Certified Medical Assistant) on 07/31/20 at 1320  Med List Status: <None>   Medication Order Taking? Sig Documenting Provider Last Dose Status Informant  Accu-Chek Softclix Lancets lancets [id number] Yes Use as instructed [name], [name], MD Taking Active   ACETAMINOPHEN PO [id number] Yes Take by mouth as needed (pain). [provider] Taking Active   albuterol (VENTOLIN HFA) 108 (90 Base) MCG/ACT inhaler [id number] Yes Inhale 1 puff into the lungs every 6 (six) hours as needed. [name], [name], MD Taking Active   ALPRAZolam Duanne Moron) 0.25 MG tablet [id number]  TAKE 1 TABLET BY MOUTH THREE TIMES DAILY AS NEEDED FOR ANXIETY [name], [name], MD  Active   amLODipine (NORVASC) 10 MG tablet [id number] Yes Take 1 tablet (10 mg total) by mouth daily. [name], [name], MD Taking Active   atorvastatin (LIPITOR) 80 MG tablet [id number] Yes Take 1 tablet (80 mg total) by mouth daily. Jerilee Hoh  Everardo Beals, MD Taking Active   Biotin 5000 MCG CAPS [id number] Yes Take 1 capsule by mouth daily. [provider] Taking Active   Continuous Blood  Gluc Receiver (FREESTYLE LIBRE 14 DAY READER) DEVI [id number] Yes 1 each by Does not apply route daily. [name], [name], MD Taking Active   Continuous Blood Gluc Sensor (FREESTYLE LIBRE [place]) [place] [id number] Yes USE 1  ONCE DAILY [name], [name], MD Taking Active   cyclobenzaprine (FLEXERIL) 5 MG tablet [id number] Yes TAKE ONE TABLET BY MOUTH EVERYDAY AT BEDTIME [name], [name], MD Taking Active   diclofenac sodium (VOLTAREN) 1 % GEL [id number] Yes Apply 2 g topically 4 (four) times daily. Caren Griffins, MD Taking Active            Med Note Louretta Shorten, APRIL   Tue Jun 04, 2020 11:06 AM)    dronedarone (MULTAQ) 400 MG tablet [id number] Yes TAKE 1 TABLET BY MOUTH TWICE DAILY WITH A MEAL [name], Will Hassell Done, MD Taking Active   ezetimibe (ZETIA) 10 MG tablet [id number] Yes Take 1 tablet (10 mg total) by mouth daily. [name], [name], MD Taking Active   furosemide (LASIX) 40 MG tablet [id number] Yes Take 1/2 (one-half) tablet by mouth once daily [name], [name], MD Taking Active   glucose blood (ACCU-CHEK AVIVA PLUS) test strip [id number] Yes Use once daily for glucose control.  Dx E11.9  For accu-chek Allen Norris, [name], MD Taking Active   glucose blood (ACCU-CHEK GUIDE) test strip [id number] Yes Use as instructed [name], [name], MD Taking Active   hydrochlorothiazide (HYDRODIURIL) 25 MG tablet [id number] Yes Take 1 tablet (25 mg total) by mouth daily. [name], [name], MD Taking Active   HYDROcodone-acetaminophen (NORCO/VICODIN) 5-325 MG tablet [id number] Yes Take 1 tablet by mouth every 6 (six) hours as needed for moderate pain. [name], [name], MD Taking Active   HYDROcodone-acetaminophen (NORCO/VICODIN) 5-325 MG tablet [id number] Yes Take 1 tablet by mouth every 6 (six) hours as needed for moderate pain. [name], [name], MD Taking Active   HYDROcodone-acetaminophen (NORCO/VICODIN) 5-325 MG  tablet [id number] Yes Take 1 tablet by mouth every 6 (six) hours as needed for moderate pain. [name], [name], MD Taking Active   Lactobacillus (ACIDOPHILUS) 0.5 MG TABS [id number] Yes Take 1 tablet by mouth daily. [provider] Taking Active   lactulose (CONSTULOSE) 10 GM/15ML solution [id number] Yes TAKE 30 MILLILITERS BY MOUTH  THREE TIMES DAILY AS NEEDED FOR MILD CONSTIPATION [name], [name], MD Taking Active   lidocaine (LIDODERM) 5 % [id number] Yes Place 1 patch onto the skin daily. Remove & Discard patch within 12 hours or as directed by MD Quintella Reichert, MD Taking Active   losartan (COZAAR) 100 MG tablet [id number] Yes Take 1 tablet (100 mg total) by mouth daily. [name], [name], MD Taking Active   metFORMIN (GLUCOPHAGE-XR) 500 MG 24 hr tablet [id number] Yes Take 500 mg by mouth daily with breakfast. [provider] Taking Active   Pirfenidone (ESBRIET) 801 MG TABS [id number] Yes Take 1 tablet by mouth 3 (three) times daily. [name], MD Taking Active   polyethylene glycol powder (GLYCOLAX/MIRALAX) powder [id number] Yes Take 17 g by mouth daily. [name], [name], MD Taking Active            Med Note [name], Elease Etienne Dec 13, 2019 10:34 AM)    rivaroxaban Alveda Reasons) 20  MG TABS tablet [id number] Yes TAKE 1 TABLET BY MOUTH ONCE DAILY WITH SUPPER [name], Will Hassell Done, MD Taking Active   senna (SENOKOT) 8.6 MG TABS tablet [id number] Yes Take 1 tablet by mouth daily as needed for mild constipation. [provider] Taking Active             Patient Active Problem List   Diagnosis Date Noted   Vitamin D deficiency 12/05/2019   Healthcare maintenance 11/09/2018   IPF (idiopathic pulmonary fibrosis) ([place]) 11/09/2018   Therapeutic drug monitoring 04/20/2018   Paroxysmal atrial fibrillation ([place]) 04/20/2018   Atrial fibrillation with RVR ([place]) 11/03/2017   LVH (left ventricular hypertrophy) 09/22/2017   Diabetes  mellitus without complication ([place]) 76/22/6333   Degeneration of lumbar intervertebral disc 07/06/2017   Pulmonary emboli ([place]) 05/02/2016   Right leg pain 05/01/2016   ILD (interstitial lung disease) ([place]) 05/01/2016   Bradycardia 05/01/2016   Coronary artery calcification seen on CAT scan 08/06/2015   Open angle with borderline findings, low risk, bilateral 04/29/2015   UIP (usual interstitial pneumonitis) ([place]) 04/05/2015   Bilateral posterior capsular opacification 09/21/2014   Pseudophakia of both eyes 07/27/2014   Combined form of senile cataract of both eyes 05/30/2014   Osteoarthritis 09/17/2011   Narrowing of intervertebral disc space 09/17/2011   Pyelonephritis with possible nonobstructing 5 mm calculus by renal ultrasound 09/16/2011   Hypokalemia 09/16/2011   External hemorrhoids 09/13/2011   Obstructive sleep apnea syndrome 06/10/2011   Anal fissure 02/17/2011   History of colonic polyps 04/03/2010   Respiratory condition due to chemical fumes and vapors ([place]) 01/20/2010   Urinary incontinence 04/30/2009   Insomnia 09/28/2007   Asthma 11/22/2006   Dyslipidemia 08/19/2006   HTN (hypertension) 08/19/2006   GERD 08/19/2006   Low back pain 08/19/2006    Immunization History  Administered Date(s) Administered   Fluad Quad(high Dose 65+) 11/29/2019   PFIZER(Purple Top)SARS-COV-2 Vaccination 04/21/2019, 05/12/2019, 12/15/2019   PPD Test 11/19/2010   Pneumococcal Polysaccharide-23 11/29/2019    Conditions to be addressed/monitored:  Hypertension, Hyperlipidemia, Diabetes, Atrial Fibrillation, Asthma, Anxiety and fluid retention, back pain, constipation, IPF  Care Plan : [place]  Updates made by [name], [place] since 08/26/2020 12:00 AM     Problem: Problem: Hypertension, Hyperlipidemia, Diabetes, Atrial Fibrillation, Asthma, Anxiety and fluid retention, back pain, constipation, IPF      Long-Range Goal: Patient-Specific Goal   Start Date:  06/10/2020  Expected End Date: 05/13/2021  Recent Progress: On track  Priority: High  Note:   Current Barriers:  Unable to independently monitor therapeutic efficacy Unable to self administer medications as prescribed  Pharmacist Clinical Goal(s):  Patient will achieve adherence to monitoring guidelines and medication adherence to achieve therapeutic efficacy achieve ability to self administer medications as prescribed through use of adherence packaging as evidenced by patient report through collaboration with PharmD and provider.   Interventions: 1:1 collaboration with [name], [name], MD regarding development and update of comprehensive plan of care as evidenced by provider attestation and co-signature Inter-disciplinary care team collaboration (see longitudinal plan of care) Comprehensive medication review performed; medication list updated in electronic medical record  Hypertension (BP goal <140/90) -Controlled -Current treatment: Amlodipine 41m, 1 tablet once daily Hydrochlorothiazide 232m 1 tablet once daily Losartan 10052m1 tablet once daily -Medications previously tried: benazepril, metoprolol  -Current home readings: patient just bought a BP cuff and plans to start using -Current dietary habits: patient reports working on it and has cut out salt and  pork; patient tries to eat a lot more fish and vegetables -Current exercise habits: patient reports she walks most days; recommended goal of 150 min/week  -Denies hypotensive/hypertensive symptoms -Educated on Daily salt intake goal < 2300 mg; Exercise goal of 150 minutes per week; Importance of home blood pressure monitoring; -Counseled to monitor BP at home at least weekly, document, and provide log at future appointments -Counseled on diet and exercise extensively Recommended to continue current medication  Hyperlipidemia: (LDL goal < 70) -Uncontrolled -Current treatment: Atorvastatin 68m, 1 tablet once  daily Ezetimibe 10 mg, 1 tablet once daily -Medications previously tried: lovastatin  -Current dietary patterns: patient tries to eat a lot more fish and vegetables -Current exercise habits:  patient reports she walks most days; recommended goal of 150 min/week  -Educated on Cholesterol goals;  Benefits of statin for ASCVD risk reduction; Importance of limiting foods high in cholesterol; Exercise goal of 150 minutes per week; -Counseled on diet and exercise extensively Recommended to continue current medication Recommended repeat lipid panel as this has not been checked since addition of Zetia  Diabetes (A1c goal <7%) -Controlled -Current medications: Metformin XR 500 mg 1 tablet before breakfast -Medications previously tried: none  -Current home glucose readings: reviewed CGM data and patient is consistently having lows in the morning (7% of BG readings were < 70).  fasting glucose: n/a post prandial glucose: n/a -Denies hypoglycemic/hyperglycemic symptoms -Current meal patterns:  breakfast: n/a  lunch: n/a  dinner: n/a snacks: n/a drinks: n/a -Current exercise: walking daily -Educated on Exercise goal of 150 minutes per week; Prevention and management of hypoglycemic episodes; Continuous glucose monitoring; Carbohydrate counting and/or plate method -Counseled to check feet daily and get yearly eye exams -Collaborated with PCP about trial off of metformin to see if the low BGs stop  Atrial Fibrillation (Goal: prevent stroke and major bleeding) -Controlled -CHADSVASC: 5 -Current treatment: Rate control: Multaq 400 mg 1 tablet twice daily with a meal Anticoagulation: Xarelto 20 mg 1 tablet daily with dinner -Medications previously tried: none -Home BP and HR readings: did not provide any -Counseled on  the importance of taking both medications with a meal -Recommended to continue current medication  Asthma/IPF (Goal: control symptoms and prevent  exacerbations) -Controlled -Current treatment  Albuterol HFA 1017m, act inhaler, inhale 1 puff every six hours as needed  Pirfenidone 80165m1 tablet in the morning, at noon, and at bedtime -Medications previously tried: none  -Patient denies consistent use of maintenance inhaler -Frequency of rescue inhaler use: as needed -Counseled on Proper inhaler technique; When to use rescue inhaler -Recommended to continue current medication   Fluid retention (Goal: minimize swelling) -Controlled -Current treatment  Furosemide 25m72m5 tablet once daily -Medications previously tried: none  -Recommended to continue current medication Counseled on limiting salt intake to prevent fluid retention  Back pain (Goal: minimize symptoms of pain) -Controlled -Current treatment  Hydrocodone/APAP 10/325mg55mtablet every six hours as needed (takes as indicated) Cyclobenzaprine 5mg,150mblet at bedtime  Diclofenac 1% gel, apply 2 g four times daily  APAP 500 mg, 1 tablet as needed for pain -Medications previously tried: meloxicam, naproxen, oxycodone  -Recommended to continue current medication  Anxiety (Goal: minimize symptoms) -Not ideally controlled -Current treatment: Alprazolam 0.25mg, 61mablet three times daily as needed for anxiety -Medications previously tried/failed: none -GAD7: n/a -Educated on Benefits of medication for symptom control - Coordinated acute fill with [name]  Constipation (Goal: normal bowel movements) -Controlled -Current treatment  docusate 100mg, 178m  capsule twice daily Lactulose solution, take 30 ML three times daily as needed for mild constipation Miralax, 17g one daily (not a part of daily regimen) -Medications previously tried: bisacodyl, Linzess (previous loose stools)  -Recommended to continue current medication   Health Maintenance -Vaccine gaps: shingles, tetanus -Current therapy:  Biotin 5000 mcg 1 capsule daily -Educated on Cost vs  benefit of each product must be carefully weighed by individual consumer -Patient is satisfied with current therapy and denies issues -Recommended to continue current medication  Patient Goals/Self-Care Activities Patient will:  - take medications as prescribed check blood pressure at least weekly, document, and provide at future appointments target a minimum of 150 minutes of moderate intensity exercise weekly  Follow Up Plan: Face to Face appointment with care management team member scheduled for:  1 month         Medication Assistance: None required.  Patient affirms current coverage meets needs.   Compliance/Adherence/Medication fill history: Care Gaps: Shingrix, foot exam, Hep C screening, eye exam  Star-Rating Drugs: Atorvastatin 80 mg - last filled 06/14/20 for 90 ds at [name] Losartan 100 mg - last filled 06/26/20 for 63 ds at [name] Metformin ER 500 mg - last filled 07/31/20 for 90 ds at [facility]  Patient's preferred pharmacy is:  [facility] ([facility]), [place] - [address] [place] ([place]) [place] [address] Phone: [phone] Fax: [phone]  [facility], [place] [address] Phone: [phone] Fax: [phone]  [name] - [place], [place] - [address] [place] [address] Phone: [phone] Fax: [phone]  Uses pill box? Yes Pt endorses 80% compliance - timing of some medications was off and she accidentally stopped taking atorvastatin as she was unsure where it was  We discussed: Benefits of medication synchronization, packaging and delivery as well as enhanced pharmacist oversight with [name]. Patient decided to: Utilize [name] pharmacy for medication synchronization, packaging and delivery  Care Plan and Follow Up Patient Decision:  Patient agrees to Care Plan  and Follow-up.  Plan: Face to Face appointment with care management team member scheduled for: 1 month (to review CGM data)  [name], PharmD [facility] [facility] Pharmacist [place] at [place] [phone]

## 2020-08-21 DIAGNOSIS — J9610 Chronic respiratory failure, unspecified whether with hypoxia or hypercapnia: Secondary | ICD-10-CM | POA: Diagnosis not present

## 2020-08-21 DIAGNOSIS — G4733 Obstructive sleep apnea (adult) (pediatric): Secondary | ICD-10-CM | POA: Diagnosis not present

## 2020-08-22 ENCOUNTER — Other Ambulatory Visit: Payer: Self-pay

## 2020-08-22 DIAGNOSIS — F419 Anxiety disorder, unspecified: Secondary | ICD-10-CM

## 2020-08-22 MED ORDER — ALPRAZOLAM 0.25 MG PO TABS: 0.2500 mg | ORAL_TABLET | Freq: Three times a day (TID) | ORAL | 0 refills | Status: DC | PRN

## 2020-08-23 ENCOUNTER — Telehealth: Payer: Self-pay | Admitting: *Deleted

## 2020-08-23 DIAGNOSIS — M5412 Radiculopathy, cervical region: Secondary | ICD-10-CM | POA: Diagnosis not present

## 2020-08-23 NOTE — Telephone Encounter (Signed)
   [facility] Pre-operative Risk Assessment    Patient Name: Miranda [name]  DOB: 01-Oct-1945  MRN: [id number]   HEARTCARE STAFF: - Please ensure there is not already an duplicate clearance open for this procedure. - Under Visit Info/Reason for Call, type in Other and utilize the format Clearance MM/DD/YY or Clearance TBD. Do not use dashes or single digits. - If request is for dental extraction, please clarify the # of teeth to be extracted. - If the patient is currently at the dentist's office, call Pre-Op APP to address. If the patient is not currently in the dentist office, please route to the Pre-Op pool  Request for surgical clearance:  What type of surgery is being performed? CERVICAL FUSION   When is this surgery scheduled? TBD   What type of clearance is required (medical clearance vs. Pharmacy clearance to hold med vs. Both)? BOTH  Are there any medications that need to be held prior to surgery and how long? [facility]   Practice name and name of physician performing surgery? [place]; DR. Duffy Rhody   What is the office phone number? [phone] ATTN: [email or web address]   7.   What is the office fax number? [place]: NIKKI  8.   Anesthesia type (None, local, MAC, general) ? GENERAL   Julaine Hua 08/23/2020, 5:04 PM  _________________________________________________________________   (provider comments below)

## 2020-08-26 ENCOUNTER — Other Ambulatory Visit: Payer: Self-pay | Admitting: Cardiovascular Disease

## 2020-08-26 DIAGNOSIS — I480 Paroxysmal atrial fibrillation: Secondary | ICD-10-CM

## 2020-08-26 MED ORDER — MULTAQ 400 MG PO TABS: ORAL_TABLET | ORAL | 3 refills | Status: DC

## 2020-08-26 NOTE — Telephone Encounter (Signed)
Patient with diagnosis of A on Xarelto for anticoagulation. Of note, patient has history of a single, remote PE.   Procedure: Cervical Fusion Date of procedure: TBD   CHA2DS2-VASc Score = 5  This indicates a 7.2% annual risk of stroke. The patient's score is based upon: CHF History: No HTN History: Yes Diabetes History: Yes Stroke History: No Vascular Disease History: Yes Age Score: 1 Gender Score: 1     Last Labs 02/05/20  Per office protocol, patient can hold Xarelto for 3 days prior to procedure.

## 2020-08-26 NOTE — Telephone Encounter (Signed)
    Miranda [name] DOB:  October 21, 1945  MRN:  [id number]   Primary Cardiologist: [name], MD  Chart reviewed as part of pre-operative protocol coverage. Given past medical history and time since last visit, based on ACC/AHA guidelines, Miranda [name] would be at acceptable risk for the planned procedure without further cardiovascular testing.   Pt recently seen by Dr. Curt Bears as well as Dr. [name] and was doing well from a CV standpoint. Per pharmacy team:  Patient with diagnosis of atrial fibrillation on Xarelto for anticoagulation. Of note, patient has history of a single, remote PE.    Procedure: Cervical Fusion Date of procedure: TBD   CHA2DS2-VASc Score = 5  This indicates a 7.2% annual risk of stroke. The patient's score is based upon: CHF History: No HTN History: Yes Diabetes History: Yes Stroke History: No Vascular Disease History: Yes Age Score: 1 Gender Score: 1    Last Labs 02/05/20   Per office protocol, patient can hold Xarelto for 3 days prior to procedure.    The patient was advised that if she develops new symptoms prior to surgery to contact our office to arrange for a follow-up visit, and she verbalized understanding.  I will route this recommendation to the requesting party via Epic fax function and remove from pre-op pool.  Please call with questions.  Kathyrn Drown, NP 08/26/2020, 11:39 AM

## 2020-08-27 ENCOUNTER — Other Ambulatory Visit: Payer: Self-pay | Admitting: Neurosurgery

## 2020-08-29 ENCOUNTER — Encounter: Payer: Self-pay | Admitting: Gastroenterology

## 2020-08-29 ENCOUNTER — Telehealth: Payer: Self-pay

## 2020-08-29 ENCOUNTER — Other Ambulatory Visit: Payer: Self-pay

## 2020-08-29 ENCOUNTER — Ambulatory Visit: Payer: HMO | Admitting: Gastroenterology

## 2020-08-29 VITALS — BP 130/60 | HR 61 | Ht 66.0 in | Wt 177.0 lb

## 2020-08-29 DIAGNOSIS — Z7902 Long term (current) use of antithrombotics/antiplatelets: Secondary | ICD-10-CM | POA: Diagnosis not present

## 2020-08-29 DIAGNOSIS — Z1211 Encounter for screening for malignant neoplasm of colon: Secondary | ICD-10-CM | POA: Diagnosis not present

## 2020-08-29 DIAGNOSIS — K5909 Other constipation: Secondary | ICD-10-CM | POA: Diagnosis not present

## 2020-08-29 MED ORDER — PLENVU 140 G PO SOLR: 140.0000 g | ORAL | 0 refills | Status: DC

## 2020-08-29 NOTE — Progress Notes (Signed)
Cardiac clearance sent to cardiac pool

## 2020-08-29 NOTE — Progress Notes (Signed)
[facility] Progress Note  Chief Complaint: Chronic constipation  Subjective  History: From 07/29/2017 office visit: "This is a 75 year old woman I saw in September 2018 for history of colon polyps, having previously been seen by Dr. Deatra Ina in 2012.  The patient had been diagnosed with a pulmonary embolism incidentally on a CT angiogram from February 2018 and was then on oral anticoagulation.  She also had pulmonary fibrosis seen on that CT scan, but had good respiratory status and did not require supplemental oxygen.  I felt it would be best to wait on surveillance colonoscopy until she could be completely off [place] if possible. I reviewed her most recent pulmonary clinic note by Dr.McQuaid from 06/09/17, reporting stable respiratory status, and patient still on Xarelto at that time. She has since discontinued Xarelto.   Royce began describing her many years of constipation with frequent straining in both lower abdominal and anorectal pain with bowel movements. She did not get improvement from MiraLAX, and has been on lactulose for many years, taking it perhaps twice a week. She often strains for bowel movements pain afterwards. I find a report from Dr. Deatra Ina who did a sigmoidoscopy with Botox ingection of anan anal fissure in December 2012." Treated with lidocaine and nitroglycerin and lactulose, and a colonoscopy was scheduled for July 2019.  In the preprocedure area on that date, she had SVT and then described preceding intermittent symptoms of chest discomfort that she had not brought to medical attention.  She was transported to the [facility], ED.  She was found to be in rapid A. fib, and is maintained on antiarrhythmic medicine and [place].  Most recent EP clinic note 06/04/2020 indicates patient has remained in sinus rhythm.  Calcified LAD and circumflex artery on CT scanning, but no chest pain.  Dr. Curt Bears recommended 59-month follow-up. ________________________  Cyndra Numbers  continues to have severe constipation, requiring a regimen of MiraLAX mixed in prune juice as well as Dulcolax and stool softeners, which she does every other day.  She still feels it is difficult to pass BMs and she has to strain, which causes pain.  She denies anal pain or bleeding with BMs. The constipation makes her bloated and gives her crampy lower abdominal pain as before.  She has a cervical spine fusion scheduled for June 29.,  Which she needs because of right-sided radicular pain  ROS: Cardiovascular:  no chest pain Respiratory: no dyspnea  The patient's Past Medical, Family and Social History were reviewed and are on file in the EMR. Past Medical History:  Diagnosis Date   Anxiety    Arthritis    Asthma    Atrial fibrillation (HCC)    CHF (congestive heart failure) ([place])    pt. unsure- but thinks she was hosp. for CHF- 2002   [facility]   Clotting disorder ([place])    blood clots in lungs/PE pulmonary embolism   Colon polyps    Complication of anesthesia    states requires a lot med. to put her to sleep    DDD (degenerative disc disease) 09/17/2011   Depression    "sometimes "   DM (diabetes mellitus) ([place])    Family history of anesthesia complication    GERD (gastroesophageal reflux disease)    Glaucoma    bilateral, pt. admits that she is noncompliant to eye gtts.    Hemorrhoids    Hyperlipidemia    Hypertension    had stress, echo-  2006 /w [place], Cardiac Cath, per pt. 2002, echo repeated 2012- wnl    Low back pain    Shortness of breath    Sleep apnea    uses c-pap- q night recently    Objective:  Med list reviewed  Current Outpatient Medications:    Accu-Chek Softclix Lancets lancets, Use as instructed, Disp: 100 each, Rfl: 12   ACETAMINOPHEN PO, Take by mouth as needed (pain)., Disp: , Rfl:    albuterol (VENTOLIN HFA) 108 (90 Base) MCG/ACT inhaler, Inhale 1 puff into the lungs every 6 (six) hours as needed., Disp: 18  g, Rfl: 1   ALPRAZolam (XANAX) 0.25 MG tablet, Take 1 tablet (0.25 mg total) by mouth 3 (three) times daily as needed. for anxiety, Disp: 90 tablet, Rfl: 0   amLODipine (NORVASC) 10 MG tablet, Take 1 tablet (10 mg total) by mouth daily., Disp: 90 tablet, Rfl: 1   atorvastatin (LIPITOR) 80 MG tablet, Take 1 tablet (80 mg total) by mouth daily., Disp: 90 tablet, Rfl: 1   Biotin 5000 MCG CAPS, Take 1 capsule by mouth daily., Disp: , Rfl:    Continuous Blood Gluc Receiver (FREESTYLE LIBRE 14 DAY READER) DEVI, 1 each by Does not apply route daily., Disp: 1 each, Rfl: 2   Continuous Blood Gluc Sensor (FREESTYLE LIBRE 14 DAY SENSOR) MISC, USE 1  ONCE DAILY, Disp: 2 each, Rfl: 4   cyclobenzaprine (FLEXERIL) 5 MG tablet, TAKE ONE TABLET BY MOUTH EVERYDAY AT BEDTIME, Disp: 71 tablet, Rfl: 1   diclofenac sodium (VOLTAREN) 1 % GEL, Apply 2 g topically 4 (four) times daily., Disp: 1 Tube, Rfl: 1   dronedarone (MULTAQ) 400 MG tablet, TAKE 1 TABLET BY MOUTH TWICE DAILY WITH A MEAL, Disp: 180 tablet, Rfl: 3   ezetimibe (ZETIA) 10 MG tablet, Take 1 tablet (10 mg total) by mouth daily., Disp: 90 tablet, Rfl: 1   furosemide (LASIX) 40 MG tablet, Take 1/2 (one-half) tablet by mouth once daily, Disp: 45 tablet, Rfl: 1   glucose blood (ACCU-CHEK AVIVA PLUS) test strip, Use once daily for glucose control.  Dx E11.9  For accu-chek aviva, Disp: 100 each, Rfl: 12   glucose blood (ACCU-CHEK GUIDE) test strip, Use as instructed, Disp: 100 each, Rfl: 12   hydrochlorothiazide (HYDRODIURIL) 25 MG tablet, Take 1 tablet (25 mg total) by mouth daily., Disp: 90 tablet, Rfl: 1   HYDROcodone-acetaminophen (NORCO/VICODIN) 5-325 MG tablet, Take 1 tablet by mouth every 6 (six) hours as needed for moderate pain., Disp: 90 tablet, Rfl: 0   HYDROcodone-acetaminophen (NORCO/VICODIN) 5-325 MG tablet, Take 1 tablet by mouth every 6 (six) hours as needed for moderate pain., Disp: 90 tablet, Rfl: 0   HYDROcodone-acetaminophen (NORCO/VICODIN)  5-325 MG tablet, Take 1 tablet by mouth every 6 (six) hours as needed for moderate pain., Disp: 90 tablet, Rfl: 0   Lactobacillus (ACIDOPHILUS) 0.5 MG TABS, Take 1 tablet by mouth daily., Disp: , Rfl:    lactulose (CONSTULOSE) 10 GM/15ML solution, TAKE 30 MILLILITERS BY MOUTH  THREE TIMES DAILY AS NEEDED FOR MILD CONSTIPATION, Disp: 473 mL, Rfl: 1   lidocaine (LIDODERM) 5 %, Place 1 patch onto the skin daily. Remove & Discard patch within 12 hours or as directed by MD, Disp: 15 patch, Rfl: 0   losartan (COZAAR) 100 MG tablet, Take 1 tablet (100 mg total) by mouth daily., Disp: 90 tablet, Rfl: 1   metFORMIN (GLUCOPHAGE-XR) 500 MG 24 hr tablet, Take 1 tablet (500 mg total) by mouth daily with breakfast.,  Disp: 90 tablet, Rfl: 1   Pirfenidone (ESBRIET) 801 MG TABS, Take 1 tablet by mouth 3 (three) times daily., Disp: 90 tablet, Rfl: 10   polyethylene glycol powder (GLYCOLAX/MIRALAX) powder, Take 17 g by mouth daily., Disp: 3350 g, Rfl: 1   rivaroxaban (XARELTO) 20 MG TABS tablet, TAKE 1 TABLET BY MOUTH ONCE DAILY WITH SUPPER, Disp: 90 tablet, Rfl: 1   senna (SENOKOT) 8.6 MG TABS tablet, Take 1 tablet by mouth daily as needed for mild constipation., Disp: , Rfl:    Vital signs in last 24 hrs: Vitals:   08/29/20 1351  BP: 130/60  Pulse: 61   Wt Readings from Last 3 Encounters:  08/29/20 177 lb (80.3 kg)  07/31/20 177 lb 12.8 oz (80.6 kg)  07/19/20 179 lb 9.6 oz (81.5 kg)    Physical Exam: Her cousin was present for the visit HEENT: sclera anicteric, oral mucosa moist without lesions Neck: supple, no thyromegaly, JVD or lymphadenopathy Cardiac: RRR without murmurs, S1S2 heard, no peripheral edema Pulm: clear to auscultation bilaterally, normal RR and effort noted Abdomen: soft, mild scattered tenderness, with active bowel sounds. No guarding or palpable hepatosplenomegaly. Skin; warm and dry, no jaundice or rash Normal perianal exam.  No fissure or palpable internal lesion on DRE.  However,  she has heightened sphincter tone and somewhat intolerant of rectal exam. Labs:   ___________________________________________ Radiologic studies:   ____________________________________________ Other: LVEF 60% in 2019  _____________________________________________ Assessment & Plan  Assessment: Encounter Diagnoses  Name Primary?   Chronic constipation Yes   Long term (current) use of antithrombotics/antiplatelets    Special screening for malignant neoplasms, colon    Longstanding constipation, persist despite multiple medicine regimen.  She tells me that she recently took some of her sisters Linzess and found it helpful.  I did discuss need to be cautious with medicines to try and avoid drug interactions or affect her cardiac condition, but think Linzess will be okay to use for her  She also has increased anal sphincter tone, and I wonder if she may have pelvic floor dysfunction and dyssynergic defecation.  Plan: Continue MiraLAX and prune juice, stop the Dulcolax so she can start Linzess. Samples of 72 mcg and 145 mcg were given.  She will take the lower dose for a week, and if ineffective, increase to the higher dose for a week.  Then call us with an update and we can proceed accordingly Use nitroglycerin ointment 3 times daily for [redacted] weeks along with the Linzess.  She needs a screening colonoscopy, but it should wait until at least late August when she will be fully recovered from her cervical spine surgery. Hold Xarelto 2 days prior, we will consult with her cardiologist about this. Small but real risk of stroke during short hold of Xarelto.  The benefits and risks of the planned procedure were described in detail with the patient or (when appropriate) their health care proxy.  Risks were outlined as including, but not limited to, bleeding, infection, perforation, adverse medication reaction leading to cardiac or pulmonary decompensation, pancreatitis (if ERCP).  The limitation of  incomplete mucosal visualization was also discussed.  No guarantees or warranties were given.  Patient at increased risk for cardiopulmonary complications of procedure due to medical comorbidities.   32 minutes were spent on this encounter (including chart review, history/exam, counseling/coordination of care, and documentation) > 50% of that time was spent on counseling and coordination of care.  Topics discussed included: See above.  Nelida Meuse  III

## 2020-08-29 NOTE — Patient Instructions (Signed)
If you are age 74 or older, your body mass index should be between 23-30. Your Body mass index is 28.57 kg/m. If this is out of the aforementioned range listed, please consider follow up with your Primary Care Provider.  If you are age 3 or younger, your body mass index should be between 19-25. Your Body mass index is 28.57 kg/m. If this is out of the aformentioned range listed, please consider follow up with your Primary Care Provider.   __________________________________________________________  The [facility] providers would like to encourage you to use [facility] to communicate with providers for non-urgent requests or questions.  Due to long hold times on the telephone, sending your provider a message by [facility] may be a faster and more efficient way to get a response.  Please allow 48 business hours for a response.  Please remember that this is for non-urgent requests.    Medication Samples have been provided to the patient.  Drug name: Linzess       Strength: 29mcg/145mcg        Qty: 8/8  LOT: [id number] Exp.Date: 01-2022/01-2022  The patient has been instructed regarding the correct time, dose, and frequency of taking this medication, including desired effects and most common side effects.

## 2020-08-29 NOTE — Telephone Encounter (Signed)
[facility] Pre-operative Risk Assessment     Request for surgical clearance:     Endoscopy Procedure  What type of surgery is being performed?     colonoscopy  When is this surgery scheduled?    11-07-2020  What type of clearance is required ?   Pharmacy  Are there any medications that need to be held prior to surgery and how long? Yes, Xarelto, 2 days  Practice name and name of physician performing surgery?      [facility]  What is your office phone and fax number?      Phone- [phone]  [phone]  Anesthesia type (None, local, MAC, general) ?       MAC

## 2020-08-30 NOTE — Telephone Encounter (Signed)
Left message to return call 

## 2020-09-02 ENCOUNTER — Telehealth: Payer: Self-pay | Admitting: Pharmacist

## 2020-09-02 NOTE — Chronic Care Management (AMB) (Signed)
The patient called me and informed me that she got her first delivery from the pharmacy. She stated that it was two medications that were not in her packages. Upon reviewing her chart. Metformin was last filled on May 18th, 2022, for 90 days. Pirfenidone (ESBRIET) 801 MG is being filled by [facility]. I explained to her to continue taking her metformin that she got from her previous pharmacy until that prescription is up and also take the [place] as prescribed. She understood she stated that she has surgery coming up on June the 29th. She had questions about her blood thinners. It was instructed for her to stop her blood thinners 5 days before her surgery. I explain the instructions to her. She confirmed she understood. She is scheduled for lab work and a CCM appointment on July 6th.   Maia Breslow, [place] Pharmacist Assistant [phone]

## 2020-09-06 NOTE — Telephone Encounter (Signed)
Patient has been notified and aware to hold the Xarelto 2 days before the procedure

## 2020-09-06 NOTE — Pre-Procedure Instructions (Signed)
Miranda [name]  09/06/2020     Your procedure is scheduled on Wed., September 11, 2020 from 1:05PM-4:19PM.  Report to [facility] Entrance "A" at 11:05AM  Call this number if you have problems the morning of surgery:  [phone]   Remember:  Do not eat or drink after midnight on June 28th    Take these medicines the morning of surgery with A SIP OF WATER: AmLODipine (NORVASC)  Atorvastatin (LIPITOR)  Ezetimibe (ZETIA)  Linaclotide (LINZESS) Pirfenidone (ESBRIET)  If Needed: Acetaminophen (TYLENOL) ALPRAZolam (XANAX) HYDROcodone-acetaminophen (NORCO/VICODIN)  Take last dose of Xarelto on 09/05/20  As of today, STOP taking all Aspirin (unless instructed by your doctor) and Other Aspirin containing products, Vitamins, Fish oils, and Herbal medications. Also stop all NSAIDS i.e. Advil, Ibuprofen, Motrin, Aleve, Anaprox, Naproxen, BC, Goody Powders, and all Supplements.    WHAT DO I DO ABOUT MY DIABETES MEDICATION?  Do not take MetFORMIN (GLUCOPHAGE-XR)  the morning of surgery.   How to Manage Your Diabetes Before and After Surgery  How do I manage my blood sugar before surgery?  Check your blood sugar the morning of your surgery when you wake up and every 2 hours until you get to the Short Stay unit. If your blood sugar is less than 70 mg/dL, you will need to treat for low blood sugar: Do not take insulin. Treat a low blood sugar (less than 70 mg/dL) with  cup of clear juice (cranberry or apple), 4 glucose tablets, OR glucose gel. Recheck blood sugar in 15 minutes after treatment (to make sure it is greater than 70 mg/dL). If your blood sugar is not greater than 70 mg/dL on recheck, call [phone]  for further instructions. If your CBG is greater than 220 mg/dL, you may take  of your sliding scale (correction) dose of insulin.  Reviewed and Endorsed by [facility] Patient Education Committee, August 2015  No Smoking of any kind, Tobacco/Vaping, or Alcohol  products 24 hours prior to your procedure. If you use a Cpap at night, you may bring all equipment for your overnight stay.    Day of Surgery:  Do not wear jewelry, make-up. Do Not wear nail polish, gel polish, artificial nails, or any other type of  covering on  natural nails including finger and toenails. If patients have artificial nails, gel coating, etc. that need to be removed by a nail salon please have this removed prior to surgery or surgery may need to be canceled/delayed if the surgeon/ anesthesia feels like the patient is unable to be adequately monitored.  Do not wear lotions, powders, or perfumes, or deodorant.  Do not shave 48 hours prior to surgery.    Do not bring valuables to the hospital.  [facility] is not responsible for any belongings or valuables.  Contacts, dentures or bridgework may not be worn into surgery.    For patients admitted to the hospital, discharge time will be determined by your treatment team.  Patients discharged the day of surgery will not be allowed to drive home, and someone age 74 and over needs to stay with them for 24 hours.   Special instructions:  [place]- Preparing For Surgery  Before surgery, you can play an important role. Because skin is not sterile, your skin needs to be as free of germs as possible. You can reduce the number of germs on your skin by washing with CHG (chlorahexidine gluconate) Soap before surgery.  CHG is an antiseptic cleaner which  kills germs and bonds with the skin to continue killing germs even after washing.    Oral Hygiene is also important to reduce your risk of infection.  Remember - BRUSH YOUR TEETH THE MORNING OF SURGERY WITH YOUR REGULAR TOOTHPASTE  Please do not use if you have an allergy to CHG or antibacterial soaps. If your skin becomes reddened/irritated stop using the CHG.  Do not shave (including legs and underarms) for at least 48 hours prior to first CHG shower. It is OK to shave your  face.  Please follow these instructions carefully.   Shower the NIGHT BEFORE SURGERY and the MORNING OF SURGERY with CHG.   If you chose to wash your hair, wash your hair first as usual with your normal shampoo.  After you shampoo, rinse your hair and body thoroughly to remove the shampoo.  Use CHG as you would any other liquid soap. You can apply CHG directly to the skin and wash gently with a scrungie or a clean washcloth.   Apply the CHG Soap to your body ONLY FROM THE NECK DOWN.  Do not use on open wounds or open sores. Avoid contact with your eyes, ears, mouth and genitals (private parts). Wash Face and genitals (private parts)  with your normal soap.  Wash thoroughly, paying special attention to the area where your surgery will be performed.  Thoroughly rinse your body with warm water from the neck down.  DO NOT shower/wash with your normal soap after using and rinsing off the CHG Soap.  Pat yourself dry with a CLEAN TOWEL.  Wear CLEAN PAJAMAS to bed the night before surgery, wear comfortable clothes the morning of surgery  Place CLEAN SHEETS on your bed the night of your first shower and DO NOT SLEEP WITH PETS.  Reminders: Do not apply any deodorants/lotions.  Please wear clean clothes to the hospital/surgery center.   Remember to brush your teeth WITH YOUR REGULAR TOOTHPASTE.  Please read over the following fact sheets that you were given.

## 2020-09-06 NOTE — Pre-Procedure Instructions (Signed)
Miranda [name]  09/06/2020     Your procedure is scheduled on Wed., September 11, 2020 from 1:05PM-4:19PM.  Report to [facility] Entrance "A" at 11:05AM  Call this number if you have problems the morning of surgery:  [phone]   Remember:  Do not eat or drink after midnight on June 28th    Take these medicines the morning of surgery with A SIP OF WATER: AmLODipine (NORVASC)  Atorvastatin (LIPITOR)  Ezetimibe (ZETIA)  Linaclotide (LINZESS) Pirfenidone (ESBRIET)  If Needed: Acetaminophen (TYLENOL) ALPRAZolam (XANAX) albuterol (VENTOLIN HFA)-bring day of surgery  Take last dose of Xarelto on 09/08/20  As of today, STOP taking all Aspirin (unless instructed by your doctor) and Other Aspirin containing products, Vitamins, Fish oils, and Herbal medications. Also stop all NSAIDS i.e. Advil, Ibuprofen, Motrin, Aleve, Anaprox, Naproxen, BC, Goody Powders, and all Supplements.    WHAT DO I DO ABOUT MY DIABETES MEDICATION?  Do not take MetFORMIN (GLUCOPHAGE-XR)  the morning of surgery.   How to Manage Your Diabetes Before and After Surgery  How do I manage my blood sugar before surgery?  Check your blood sugar the morning of your surgery when you wake up and every 2 hours until you get to the Short Stay unit. If your blood sugar is less than 70 mg/dL, you will need to treat for low blood sugar: Do not take insulin. Treat a low blood sugar (less than 70 mg/dL) with  cup of clear juice (cranberry or apple), 4 glucose tablets, OR glucose gel. Recheck blood sugar in 15 minutes after treatment (to make sure it is greater than 70 mg/dL). If your blood sugar is not greater than 70 mg/dL on recheck, call [phone]  for further instructions. If your CBG is greater than 220 mg/dL, you may take  of your sliding scale (correction) dose of insulin.  Reviewed and Endorsed by [facility] Patient Education Committee, August 2015  No Smoking of any kind, Tobacco/Vaping, or  Alcohol products 24 hours prior to your procedure. If you use a Cpap at night, you may bring all equipment for your overnight stay.    Day of Surgery:  Do not wear jewelry, make-up. Do Not wear nail polish, gel polish, artificial nails, or any other type of  covering on  natural nails including finger and toenails. If patients have artificial nails, gel coating, etc. that need to be removed by a nail salon please have this removed prior to surgery or surgery may need to be canceled/delayed if the surgeon/ anesthesia feels like the patient is unable to be adequately monitored.  Do not wear lotions, powders, or perfumes, or deodorant.  Do not shave 48 hours prior to surgery.    Do not bring valuables to the hospital.  [facility] is not responsible for any belongings or valuables.  Contacts, dentures or bridgework may not be worn into surgery.    For patients admitted to the hospital, discharge time will be determined by your treatment team.  Patients discharged the day of surgery will not be allowed to drive home, and someone age 90 and over needs to stay with them for 24 hours.   Special instructions:  [place]- Preparing For Surgery  Before surgery, you can play an important role. Because skin is not sterile, your skin needs to be as free of germs as possible. You can reduce the number of germs on your skin by washing with CHG (chlorahexidine gluconate) Soap before surgery.  CHG is  an antiseptic cleaner which kills germs and bonds with the skin to continue killing germs even after washing.    Oral Hygiene is also important to reduce your risk of infection.  Remember - BRUSH YOUR TEETH THE MORNING OF SURGERY WITH YOUR REGULAR TOOTHPASTE  Please do not use if you have an allergy to CHG or antibacterial soaps. If your skin becomes reddened/irritated stop using the CHG.  Do not shave (including legs and underarms) for at least 48 hours prior to first CHG shower. It is OK to shave your  face.  Please follow these instructions carefully.   Shower the NIGHT BEFORE SURGERY and the MORNING OF SURGERY with CHG.   If you chose to wash your hair, wash your hair first as usual with your normal shampoo.  After you shampoo, rinse your hair and body thoroughly to remove the shampoo.  Use CHG as you would any other liquid soap. You can apply CHG directly to the skin and wash gently with a scrungie or a clean washcloth.   Apply the CHG Soap to your body ONLY FROM THE NECK DOWN.  Do not use on open wounds or open sores. Avoid contact with your eyes, ears, mouth and genitals (private parts). Wash Face and genitals (private parts)  with your normal soap.  Wash thoroughly, paying special attention to the area where your surgery will be performed.  Thoroughly rinse your body with warm water from the neck down.  DO NOT shower/wash with your normal soap after using and rinsing off the CHG Soap.  Pat yourself dry with a CLEAN TOWEL.  Wear CLEAN PAJAMAS to bed the night before surgery, wear comfortable clothes the morning of surgery  Place CLEAN SHEETS on your bed the night of your first shower and DO NOT SLEEP WITH PETS.  Reminders: Do not apply any deodorants/lotions.  Please wear clean clothes to the hospital/surgery center.   Remember to brush your teeth WITH YOUR REGULAR TOOTHPASTE.  Please read over the following fact sheets that you were given.

## 2020-09-09 ENCOUNTER — Encounter (HOSPITAL_COMMUNITY)
Admission: RE | Admit: 2020-09-09 | Discharge: 2020-09-09 | Disposition: A | Discharge status: Home / Self Care | Payer: HMO | Source: Ambulatory Visit | Attending: Neurosurgery | Admitting: Neurosurgery

## 2020-09-09 ENCOUNTER — Other Ambulatory Visit: Payer: Self-pay

## 2020-09-09 ENCOUNTER — Encounter (HOSPITAL_COMMUNITY): Payer: Self-pay

## 2020-09-09 DIAGNOSIS — Z7901 Long term (current) use of anticoagulants: Secondary | ICD-10-CM | POA: Insufficient documentation

## 2020-09-09 DIAGNOSIS — Z91041 Radiographic dye allergy status: Secondary | ICD-10-CM | POA: Diagnosis not present

## 2020-09-09 DIAGNOSIS — I129 Hypertensive chronic kidney disease with stage 1 through stage 4 chronic kidney disease, or unspecified chronic kidney disease: Secondary | ICD-10-CM | POA: Insufficient documentation

## 2020-09-09 DIAGNOSIS — E559 Vitamin D deficiency, unspecified: Secondary | ICD-10-CM | POA: Diagnosis not present

## 2020-09-09 DIAGNOSIS — I2510 Atherosclerotic heart disease of native coronary artery without angina pectoris: Secondary | ICD-10-CM | POA: Insufficient documentation

## 2020-09-09 DIAGNOSIS — I4891 Unspecified atrial fibrillation: Secondary | ICD-10-CM | POA: Insufficient documentation

## 2020-09-09 DIAGNOSIS — J45909 Unspecified asthma, uncomplicated: Secondary | ICD-10-CM | POA: Diagnosis present

## 2020-09-09 DIAGNOSIS — Z20822 Contact with and (suspected) exposure to covid-19: Secondary | ICD-10-CM | POA: Diagnosis present

## 2020-09-09 DIAGNOSIS — N189 Chronic kidney disease, unspecified: Secondary | ICD-10-CM | POA: Diagnosis present

## 2020-09-09 DIAGNOSIS — Z01812 Encounter for preprocedural laboratory examination: Secondary | ICD-10-CM | POA: Diagnosis not present

## 2020-09-09 DIAGNOSIS — I509 Heart failure, unspecified: Secondary | ICD-10-CM | POA: Diagnosis present

## 2020-09-09 DIAGNOSIS — R490 Dysphonia: Secondary | ICD-10-CM | POA: Diagnosis not present

## 2020-09-09 DIAGNOSIS — M5412 Radiculopathy, cervical region: Secondary | ICD-10-CM | POA: Insufficient documentation

## 2020-09-09 DIAGNOSIS — I130 Hypertensive heart and chronic kidney disease with heart failure and stage 1 through stage 4 chronic kidney disease, or unspecified chronic kidney disease: Secondary | ICD-10-CM | POA: Diagnosis present

## 2020-09-09 DIAGNOSIS — Z880 Allergy status to penicillin: Secondary | ICD-10-CM | POA: Diagnosis not present

## 2020-09-09 DIAGNOSIS — Z9842 Cataract extraction status, left eye: Secondary | ICD-10-CM | POA: Diagnosis not present

## 2020-09-09 DIAGNOSIS — Z888 Allergy status to other drugs, medicaments and biological substances status: Secondary | ICD-10-CM | POA: Diagnosis not present

## 2020-09-09 DIAGNOSIS — M4802 Spinal stenosis, cervical region: Secondary | ICD-10-CM | POA: Diagnosis present

## 2020-09-09 DIAGNOSIS — Z7984 Long term (current) use of oral hypoglycemic drugs: Secondary | ICD-10-CM | POA: Insufficient documentation

## 2020-09-09 DIAGNOSIS — E876 Hypokalemia: Secondary | ICD-10-CM | POA: Diagnosis not present

## 2020-09-09 DIAGNOSIS — F419 Anxiety disorder, unspecified: Secondary | ICD-10-CM | POA: Diagnosis present

## 2020-09-09 DIAGNOSIS — Z9109 Other allergy status, other than to drugs and biological substances: Secondary | ICD-10-CM | POA: Diagnosis not present

## 2020-09-09 DIAGNOSIS — J9601 Acute respiratory failure with hypoxia: Secondary | ICD-10-CM | POA: Diagnosis not present

## 2020-09-09 DIAGNOSIS — Z91040 Latex allergy status: Secondary | ICD-10-CM | POA: Diagnosis not present

## 2020-09-09 DIAGNOSIS — Z86010 Personal history of colonic polyps: Secondary | ICD-10-CM | POA: Diagnosis not present

## 2020-09-09 DIAGNOSIS — Z9841 Cataract extraction status, right eye: Secondary | ICD-10-CM | POA: Diagnosis not present

## 2020-09-09 DIAGNOSIS — Z86711 Personal history of pulmonary embolism: Secondary | ICD-10-CM | POA: Diagnosis not present

## 2020-09-09 DIAGNOSIS — E1122 Type 2 diabetes mellitus with diabetic chronic kidney disease: Secondary | ICD-10-CM | POA: Insufficient documentation

## 2020-09-09 DIAGNOSIS — Z90710 Acquired absence of both cervix and uterus: Secondary | ICD-10-CM | POA: Diagnosis not present

## 2020-09-09 DIAGNOSIS — I480 Paroxysmal atrial fibrillation: Secondary | ICD-10-CM | POA: Diagnosis not present

## 2020-09-09 DIAGNOSIS — R531 Weakness: Secondary | ICD-10-CM | POA: Diagnosis not present

## 2020-09-09 DIAGNOSIS — Z91013 Allergy to seafood: Secondary | ICD-10-CM | POA: Diagnosis not present

## 2020-09-09 DIAGNOSIS — J84112 Idiopathic pulmonary fibrosis: Secondary | ICD-10-CM | POA: Insufficient documentation

## 2020-09-09 DIAGNOSIS — G4733 Obstructive sleep apnea (adult) (pediatric): Secondary | ICD-10-CM | POA: Diagnosis not present

## 2020-09-09 DIAGNOSIS — J9610 Chronic respiratory failure, unspecified whether with hypoxia or hypercapnia: Secondary | ICD-10-CM | POA: Diagnosis not present

## 2020-09-09 DIAGNOSIS — Z981 Arthrodesis status: Secondary | ICD-10-CM | POA: Diagnosis not present

## 2020-09-09 DIAGNOSIS — Z79899 Other long term (current) drug therapy: Secondary | ICD-10-CM | POA: Insufficient documentation

## 2020-09-09 DIAGNOSIS — M4326 Fusion of spine, lumbar region: Secondary | ICD-10-CM | POA: Diagnosis not present

## 2020-09-09 HISTORY — DX: Idiopathic pulmonary fibrosis: J84.112

## 2020-09-09 LAB — BASIC METABOLIC PANEL
Anion gap: 11 (ref 5–15)
BUN: 17 mg/dL (ref 8–23)
CO2: 28 mmol/L (ref 22–32)
Calcium: 9.7 mg/dL (ref 8.9–10.3)
Chloride: 98 mmol/L (ref 98–111)
Creatinine, Ser: 0.93 mg/dL (ref 0.44–1.00)
GFR, Estimated: >60 mL/min (ref >60)
Glucose, Bld: 86 mg/dL (ref 70–99)
Potassium: 3 mmol/L — ABNORMAL LOW (ref 3.5–5.1)
Sodium: 137 mmol/L (ref 135–145)

## 2020-09-09 LAB — CBC
HCT: 40.1 % (ref 36.0–46.0)
Hemoglobin: 12.8 g/dL (ref 12.0–15.0)
MCH: 27.9 pg (ref 26.0–34.0)
MCHC: 31.9 g/dL (ref 30.0–36.0)
MCV: 87.6 fL (ref 80.0–100.0)
Platelets: 204 10*3/uL (ref 150–400)
RBC: 4.58 MIL/uL (ref 3.87–5.11)
RDW: 14.2 % (ref 11.5–15.5)
WBC: 6.3 10*3/uL (ref 4.0–10.5)
nRBC: 0 % (ref 0.0–0.2)

## 2020-09-09 LAB — TYPE AND SCREEN
ABO/RH(D): A POS
Antibody Screen: NEGATIVE

## 2020-09-09 LAB — SURGICAL PCR SCREEN
MRSA, PCR: NEGATIVE
Staphylococcus aureus: POSITIVE — AB

## 2020-09-09 LAB — SARS CORONAVIRUS 2 (TAT 6-24 HRS): SARS Coronavirus 2: NEGATIVE

## 2020-09-09 LAB — GLUCOSE, CAPILLARY: Glucose-Capillary: 131 mg/dL — ABNORMAL HIGH (ref 70–99)

## 2020-09-09 NOTE — Pre-Procedure Instructions (Signed)
Miranda [name]  09/09/2020     Your procedure is scheduled on Wed., September 11, 2020 from  Report to [facility] Entrance "A" at 11:05AM  Call this number if you have problems the morning of surgery:  [phone]   Remember:  Do not eat or drink after midnight on June 28th, the night before surgery    Take these medicines the morning of surgery with A SIP OF WATER: AmLODipine (NORVASC)  Atorvastatin (LIPITOR)  Ezetimibe (ZETIA)  Pirfenidone (ESBRIET)  If Needed: Acetaminophen (TYLENOL) ALPRAZolam (XANAX) albuterol (VENTOLIN HFA)-bring day of surgery  Per your Doctors instructions, HOLD Xarelto for 3 days before surgery  As of today, STOP taking all Aspirin (unless instructed by your doctor) and Other Aspirin containing products, Vitamins, Fish oils, and Herbal medications. Also stop all NSAIDS i.e. Advil, Ibuprofen, Motrin, Aleve, Anaprox, Naproxen, BC, Goody Powders, and all Supplements.    WHAT DO I DO ABOUT MY DIABETES MEDICATION?  Do not take MetFORMIN (GLUCOPHAGE-XR)  the morning of surgery.   How to Manage Your Diabetes Before and After Surgery  How do I manage my blood sugar before surgery?  Check your blood sugar the morning of your surgery when you wake up and every 2 hours until you get to the Short Stay unit. If your blood sugar is less than 70 mg/dL, you will need to treat for low blood sugar: Do not take insulin. Treat a low blood sugar (less than 70 mg/dL) with  cup of clear juice (cranberry or apple), 4 glucose tablets, OR glucose gel. Recheck blood sugar in 15 minutes after treatment (to make sure it is greater than 70 mg/dL). If your blood sugar is not greater than 70 mg/dL on recheck, call [phone]  for further instructions. If your CBG is greater than 220 mg/dL, you may take  of your sliding scale (correction) dose of insulin.  Reviewed and Endorsed by [facility] Patient Education Committee, August 2015  No Smoking of any kind,  Tobacco/Vaping, or Alcohol products 24 hours prior to your procedure. If you use a Cpap at night, you may bring all equipment for your overnight stay.    Day of Surgery:  Do not wear jewelry, make-up. Do Not wear nail polish, gel polish, artificial nails, or any other type of  covering on natural nails including finger and toenails. If patients have artificial nails, gel coating, etc. that need to be removed by a nail salon please have this removed prior to surgery or surgery may need to be canceled/delayed if the surgeon/ anesthesia feels like the patient is unable to be adequately monitored.  Do not wear lotions, powders, or perfumes, or deodorant.  Do not shave 48 hours prior to surgery.    Do not bring valuables to the hospital.  [facility] is not responsible for any belongings or valuables.  Contacts, dentures or bridgework may not be worn into surgery.    For patients admitted to the hospital, discharge time will be determined by your treatment team.  Patients discharged the day of surgery will not be allowed to drive home, and someone age 65 and over needs to stay with them for 24 hours.   Special instructions:  [place]- Preparing For Surgery  Before surgery, you can play an important role. Because skin is not sterile, your skin needs to be as free of germs as possible. You can reduce the number of germs on your skin by washing with CHG (chlorahexidine gluconate) Soap before  surgery.  CHG is an antiseptic cleaner which kills germs and bonds with the skin to continue killing germs even after washing.    Oral Hygiene is also important to reduce your risk of infection.  Remember - BRUSH YOUR TEETH THE MORNING OF SURGERY WITH YOUR REGULAR TOOTHPASTE  Please do not use if you have an allergy to CHG or antibacterial soaps. If your skin becomes reddened/irritated stop using the CHG.  Do not shave (including legs and underarms) for at least 48 hours prior to first CHG shower. It is OK  to shave your face.  Please follow these instructions carefully.   Shower the NIGHT BEFORE SURGERY and the MORNING OF SURGERY with CHG.   If you chose to wash your hair, wash your hair first as usual with your normal shampoo.  After you shampoo, rinse your hair and body thoroughly to remove the shampoo.  Use CHG as you would any other liquid soap. You can apply CHG directly to the skin and wash gently with a scrungie or a clean washcloth.   Apply the CHG Soap to your body ONLY FROM THE NECK DOWN.  Do not use on open wounds or open sores. Avoid contact with your eyes, ears, mouth and genitals (private parts). Wash Face and genitals (private parts)  with your normal soap.  Wash thoroughly, paying special attention to the area where your surgery will be performed.  Thoroughly rinse your body with warm water from the neck down.  DO NOT shower/wash with your normal soap after using and rinsing off the CHG Soap.  Pat yourself dry with a CLEAN TOWEL.  Wear CLEAN PAJAMAS to bed the night before surgery, wear comfortable clothes the morning of surgery  Place CLEAN SHEETS on your bed the night of your first shower and DO NOT SLEEP WITH PETS.  Reminders: Do not apply any deodorants/lotions.  Please wear clean clothes to the hospital/surgery center.   Remember to brush your teeth WITH YOUR REGULAR TOOTHPASTE.  Please read over the following fact sheets that you were given.

## 2020-09-09 NOTE — Progress Notes (Signed)
PCP: Dr. Jerilee Hoh Cardiologist: Dr. [name]  EKG: 04-19-2020 CXR:02-05-20 ECHO: 2019 Stress Test: 10/2017 Cardiac Cath: 2006  Fasting Blood Sugar- 80-100's Checks Blood Sugar: Wears Continuous monitor Last A1C= 5.8  Reports she was instructed to [place] for 5 days, but Last Dose was WED September 04, 2020  Patient denies shortness of breath, fever, cough, and chest pain at PAT appointment.  Patient verbalized understanding of instructions provided today at the PAT appointment.  Patient asked to review instructions at home and day of surgery.

## 2020-09-10 ENCOUNTER — Encounter (HOSPITAL_COMMUNITY): Payer: Self-pay

## 2020-09-10 ENCOUNTER — Other Ambulatory Visit: Payer: Self-pay | Admitting: Neurosurgery

## 2020-09-10 NOTE — Anesthesia Preprocedure Evaluation (Addendum)
Anesthesia Evaluation  Patient identified by MRN, date of birth, ID band Patient awake    Reviewed: Allergy & Precautions, H&P , NPO status , Patient's Chart, lab work & pertinent test results  Airway Mallampati: II   Neck ROM: full    Dental   Pulmonary shortness of breath, asthma , sleep apnea ,  Idiopathic pulmonary fibrosis   breath sounds clear to auscultation       Cardiovascular hypertension, +CHF  + dysrhythmias Atrial Fibrillation  Rhythm:regular Rate:Normal     Neuro/Psych PSYCHIATRIC DISORDERS Anxiety Depression    GI/Hepatic GERD  ,  Endo/Other  diabetes, Type 2  Renal/GU      Musculoskeletal  (+) Arthritis ,   Abdominal   Peds  Hematology   Anesthesia Other Findings   Reproductive/Obstetrics                            Anesthesia Physical Anesthesia Plan  ASA: 3  Anesthesia Plan: General   Post-op Pain Management:    Induction: Intravenous  PONV Risk Score and Plan: 3 and Ondansetron, Dexamethasone and Treatment may vary due to age or medical condition  Airway Management Planned: Oral ETT  Additional Equipment:   Intra-op Plan:   Post-operative Plan: Extubation in OR  Informed Consent: I have reviewed the patients History and Physical, chart, labs and discussed the procedure including the risks, benefits and alternatives for the proposed anesthesia with the patient or authorized representative who has indicated his/her understanding and acceptance.     Dental advisory given  Plan Discussed with: CRNA, Anesthesiologist and Surgeon  Anesthesia Plan Comments: (See APP note by Durel Salts, FNP )       Anesthesia Quick Evaluation

## 2020-09-10 NOTE — Progress Notes (Signed)
Anesthesia Chart Review:   Case: [id number] Date/Time: 09/11/20 1250   Procedure: ACDF C45, C56 - 3C   Anesthesia type: General   Pre-op diagnosis: CERVICAL RADICULOPATHY   Location: [facility] OR ROOM 19 / [place] OR   Surgeons: [name] Mare, MD       DISCUSSION: Pt is 75 years old with hx atrial fibrillation, coronary artery calcification on CT (12/15/19), CHF (note: I cannot find evidence for this dx), HTN, DM, OSA, asthma, idiopathic pulmonary fibrosis, CKD, PE (05/01/16)  Last dose xarelto 09/04/20  VS: BP (!) 143/65   Pulse 62   Temp 36.8 C (Oral)   Resp 18   Ht 5\' 5"  (1.651 m)   Wt 80.3 kg   SpO2 100%   BMI 29.45 kg/m   PROVIDERS: - PCP is [name], [name], MD - Cardiologist is [name], MD. Last office visit 04/19/20. Pt cleared for surgery by Kathyrn Drown, NP on 08/26/20 - EP cardiologist is Allegra Lai, MD. Last office visit 06/04/20 - Pulmonology care at [facility]. Last office visit 10/23/19 with Rexene Edison, NP  LABS: Labs reviewed: Acceptable for surgery. (all labs ordered are listed, but only abnormal results are displayed)  Labs Reviewed  SURGICAL PCR SCREEN - Abnormal; Notable for the following components:      Result Value   Staphylococcus aureus POSITIVE (*)    All other components within normal limits  GLUCOSE, CAPILLARY - Abnormal; Notable for the following components:   Glucose-Capillary 131 (*)    All other components within normal limits  BASIC METABOLIC PANEL - Abnormal; Notable for the following components:   Potassium 3.0 (*)    All other components within normal limits  SARS CORONAVIRUS 2 (TAT 6-24 HRS)  CBC  TYPE AND SCREEN     IMAGES: CXR 02/05/20:  - Basilar interstitial prominence and hazy opacities. Differential includes chronic sequela of fibrosis, atelectasis and infiltrate.  CT chest hi res 12/15/19:  1. Pulmonary parenchymal pattern of fibrosis is unchanged from 12/09/2018. Findings are consistent with UIP per  consensus guidelines:  2. Aortic atherosclerosis (ICD10-I70.0). Coronary artery calcification.    EKG 04/19/20: sinus rhythm   CV: Holter monitor 03/24/18:  Rare PACs and PVCs Zero atrial fibrillation Sinus rhythm with occasional atrial and ventricular ectopy Bradycardia with HR in the 30s between 6-7AM Sitting with arm pain and walking with fatigue associated with sinus rhythm   Nuclear stress test 11/12/17:  Nuclear stress EF: 66%. There was no ST segment deviation noted during stress. No T wave inversion was noted during stress. The study is normal. This is a low risk study. The left ventricular ejection fraction is hyperdynamic (>65%).   Echo 10/19/17:  - Left ventricle: The cavity size was normal. Systolic function was normal. The estimated ejection fraction was in the range of 60% to 65%. Wall motion was normal; there were no regional wall motion abnormalities. Doppler parameters are consistent with abnormal left ventricular relaxation (grade 1 diastolic dysfunction).  - Aortic valve: There was no significant regurgitation.  - Mitral valve: There was trivial regurgitation.  - Left atrium: The atrium was mildly dilated.  - Right ventricle: The cavity size was mildly dilated. Wall thickness was normal.  - Tricuspid valve: There was trivial regurgitation.  - Pulmonic valve: There was trivial regurgitation.  - Inferior vena cava: The vessel was normal in size. The respirophasic diameter changes were in the normal range (>= 50%), consistent with normal central venous pressure.  - Impressions: Normal LV systolic  function. No significant valvular disease.    Past Medical History:  Diagnosis Date   Anxiety    Arthritis    Asthma    Atrial fibrillation (HCC)    CHF (congestive heart failure) ([place])    pt. unsure- but thinks she was hosp. for CHF- 2002   Chronic kidney disease    [facility]   Clotting disorder ([place])    blood clots in lungs/PE pulmonary embolism   Colon  polyps    Complication of anesthesia    states requires a lot med. to put her to sleep    DDD (degenerative disc disease) 09/17/2011   Depression    "sometimes "   DM (diabetes mellitus) ([place])    Family history of anesthesia complication    Sister had difficulty waking up from anesthesia   GERD (gastroesophageal reflux disease)    Glaucoma    bilateral, pt. admits that she is noncompliant to eye gtts.    Hemorrhoids    Hyperlipidemia    Hypertension    had stress, echo- 2006 /w [place], Cardiac Cath, per pt. 2002, echo repeated 2012- wnl    IPF (idiopathic pulmonary fibrosis) (HCC)    Low back pain    Shortness of breath    Sleep apnea    uses c-pap- q night recently    Past Surgical History:  Procedure Laterality Date   ABDOMINAL HYSTERECTOMY     ectopic, fibroids   arm surgery Right    CARDIAC CATHETERIZATION     CATARACT EXTRACTION, BILATERAL     cataracts removed bilateral- ?IOL   COLONOSCOPY     remote   FISSURECTOMY  10/08/2011   Procedure: FISSURECTOMY;  Surgeon: Stark Klein, MD;  Location: [place] OR;  Service: General;  Laterality: N/A;   FLEXIBLE SIGMOIDOSCOPY  02/25/2011   Procedure: Beryle Quant;  Surgeon: Inda Castle, MD;  Location: [facility] ENDOSCOPY;  Service: Endoscopy;  Laterality: N/A;   FOOT SURGERY     bilat, heel spurs- screw in R foot    HEMORRHOID SURGERY  10/08/2011   Procedure: HEMORRHOIDECTOMY;  Surgeon: Stark Klein, MD;  Location: [place];  Service: General;  Laterality: N/A;  External    SPHINCTEROTOMY  10/08/2011   Procedure: Joan Mayans;  Surgeon: Stark Klein, MD;  Location: [facility] OR;  Service: General;  Laterality: N/A;    MEDICATIONS:  Accu-Chek Softclix Lancets lancets   acetaminophen (TYLENOL) 325 MG tablet   albuterol (VENTOLIN HFA) 108 (90 Base) MCG/ACT inhaler   ALPRAZolam (XANAX) 0.25 MG tablet   amLODipine (NORVASC) 10 MG tablet   atorvastatin (LIPITOR) 80 MG tablet   Continuous Blood Gluc Receiver (FREESTYLE LIBRE 14 DAY  READER) DEVI   Continuous Blood Gluc Sensor (FREESTYLE LIBRE 14 DAY SENSOR) MISC   cyclobenzaprine (FLEXERIL) 5 MG tablet   diclofenac sodium (VOLTAREN) 1 % GEL   dronedarone (MULTAQ) 400 MG tablet   ezetimibe (ZETIA) 10 MG tablet   furosemide (LASIX) 40 MG tablet   glucose blood (ACCU-CHEK AVIVA PLUS) test strip   glucose blood (ACCU-CHEK GUIDE) test strip   hydrochlorothiazide (HYDRODIURIL) 25 MG tablet   HYDROcodone-acetaminophen (NORCO/VICODIN) 5-325 MG tablet   HYDROcodone-acetaminophen (NORCO/VICODIN) 5-325 MG tablet   HYDROcodone-acetaminophen (NORCO/VICODIN) 5-325 MG tablet   lactulose (CONSTULOSE) 10 GM/15ML solution   lidocaine (LIDODERM) 5 %   linaclotide (LINZESS) 72 MCG capsule   losartan (COZAAR) 100 MG tablet   metFORMIN (GLUCOPHAGE-XR) 500 MG 24 hr tablet   PEG-KCl-NaCl-NaSulf-Na Asc-C (PLENVU) 140 g SOLR   Pirfenidone (ESBRIET)  801 MG TABS   polyethylene glycol powder (GLYCOLAX/MIRALAX) powder   rivaroxaban (XARELTO) 20 MG TABS tablet   senna (SENOKOT) 8.6 MG TABS tablet   No current facility-administered medications for this encounter.   Last dose xarelto 09/04/20   If no changes, I anticipate pt can proceed with surgery as scheduled.   Willeen Cass, PhD, FNP-BC [facility] Short Stay Surgical Center/Anesthesiology Phone: [phone] 09/10/2020 10:23 AM

## 2020-09-11 ENCOUNTER — Encounter (HOSPITAL_COMMUNITY)
Admission: RE | Disposition: A | Discharge status: Home / Self Care | Payer: Self-pay | Source: Home / Self Care | Attending: Neurosurgery

## 2020-09-11 ENCOUNTER — Other Ambulatory Visit: Payer: Self-pay

## 2020-09-11 ENCOUNTER — Inpatient Hospital Stay (HOSPITAL_COMMUNITY)
Admission: RE | Admit: 2020-09-11 | Discharge: 2020-09-13 | DRG: 029 | Disposition: A | Discharge status: Home / Self Care | Payer: HMO | Attending: Neurosurgery | Admitting: Neurosurgery

## 2020-09-11 ENCOUNTER — Ambulatory Visit (HOSPITAL_COMMUNITY): Payer: HMO | Admitting: Emergency Medicine

## 2020-09-11 ENCOUNTER — Ambulatory Visit (HOSPITAL_COMMUNITY): Payer: HMO

## 2020-09-11 ENCOUNTER — Encounter (HOSPITAL_COMMUNITY): Payer: Self-pay | Admitting: Anesthesiology

## 2020-09-11 DIAGNOSIS — Z90710 Acquired absence of both cervix and uterus: Secondary | ICD-10-CM

## 2020-09-11 DIAGNOSIS — M5412 Radiculopathy, cervical region: Secondary | ICD-10-CM | POA: Diagnosis not present

## 2020-09-11 DIAGNOSIS — Z9841 Cataract extraction status, right eye: Secondary | ICD-10-CM

## 2020-09-11 DIAGNOSIS — J45909 Unspecified asthma, uncomplicated: Secondary | ICD-10-CM | POA: Diagnosis present

## 2020-09-11 DIAGNOSIS — Z825 Family history of asthma and other chronic lower respiratory diseases: Secondary | ICD-10-CM

## 2020-09-11 DIAGNOSIS — R490 Dysphonia: Secondary | ICD-10-CM | POA: Diagnosis not present

## 2020-09-11 DIAGNOSIS — Z7901 Long term (current) use of anticoagulants: Secondary | ICD-10-CM

## 2020-09-11 DIAGNOSIS — Z803 Family history of malignant neoplasm of breast: Secondary | ICD-10-CM

## 2020-09-11 DIAGNOSIS — I480 Paroxysmal atrial fibrillation: Secondary | ICD-10-CM | POA: Diagnosis present

## 2020-09-11 DIAGNOSIS — Z981 Arthrodesis status: Secondary | ICD-10-CM | POA: Diagnosis not present

## 2020-09-11 DIAGNOSIS — Z91013 Allergy to seafood: Secondary | ICD-10-CM

## 2020-09-11 DIAGNOSIS — M4326 Fusion of spine, lumbar region: Secondary | ICD-10-CM | POA: Diagnosis not present

## 2020-09-11 DIAGNOSIS — E876 Hypokalemia: Secondary | ICD-10-CM | POA: Diagnosis not present

## 2020-09-11 DIAGNOSIS — E559 Vitamin D deficiency, unspecified: Secondary | ICD-10-CM | POA: Diagnosis not present

## 2020-09-11 DIAGNOSIS — Z86010 Personal history of colonic polyps: Secondary | ICD-10-CM

## 2020-09-11 DIAGNOSIS — J84112 Idiopathic pulmonary fibrosis: Secondary | ICD-10-CM | POA: Diagnosis present

## 2020-09-11 DIAGNOSIS — Z9109 Other allergy status, other than to drugs and biological substances: Secondary | ICD-10-CM

## 2020-09-11 DIAGNOSIS — Z880 Allergy status to penicillin: Secondary | ICD-10-CM

## 2020-09-11 DIAGNOSIS — Z7984 Long term (current) use of oral hypoglycemic drugs: Secondary | ICD-10-CM

## 2020-09-11 DIAGNOSIS — Z91041 Radiographic dye allergy status: Secondary | ICD-10-CM

## 2020-09-11 DIAGNOSIS — I130 Hypertensive heart and chronic kidney disease with heart failure and stage 1 through stage 4 chronic kidney disease, or unspecified chronic kidney disease: Secondary | ICD-10-CM | POA: Diagnosis present

## 2020-09-11 DIAGNOSIS — F419 Anxiety disorder, unspecified: Secondary | ICD-10-CM | POA: Diagnosis present

## 2020-09-11 DIAGNOSIS — Z01812 Encounter for preprocedural laboratory examination: Secondary | ICD-10-CM

## 2020-09-11 DIAGNOSIS — Z961 Presence of intraocular lens: Secondary | ICD-10-CM | POA: Diagnosis present

## 2020-09-11 DIAGNOSIS — Z8249 Family history of ischemic heart disease and other diseases of the circulatory system: Secondary | ICD-10-CM

## 2020-09-11 DIAGNOSIS — I2510 Atherosclerotic heart disease of native coronary artery without angina pectoris: Secondary | ICD-10-CM | POA: Diagnosis present

## 2020-09-11 DIAGNOSIS — Z419 Encounter for procedure for purposes other than remedying health state, unspecified: Secondary | ICD-10-CM

## 2020-09-11 DIAGNOSIS — Z20822 Contact with and (suspected) exposure to covid-19: Secondary | ICD-10-CM | POA: Diagnosis present

## 2020-09-11 DIAGNOSIS — Z801 Family history of malignant neoplasm of trachea, bronchus and lung: Secondary | ICD-10-CM

## 2020-09-11 DIAGNOSIS — E785 Hyperlipidemia, unspecified: Secondary | ICD-10-CM | POA: Diagnosis present

## 2020-09-11 DIAGNOSIS — I509 Heart failure, unspecified: Secondary | ICD-10-CM | POA: Diagnosis present

## 2020-09-11 DIAGNOSIS — Z86711 Personal history of pulmonary embolism: Secondary | ICD-10-CM

## 2020-09-11 DIAGNOSIS — M4802 Spinal stenosis, cervical region: Secondary | ICD-10-CM | POA: Diagnosis present

## 2020-09-11 DIAGNOSIS — E1122 Type 2 diabetes mellitus with diabetic chronic kidney disease: Secondary | ICD-10-CM | POA: Diagnosis present

## 2020-09-11 DIAGNOSIS — Z91040 Latex allergy status: Secondary | ICD-10-CM

## 2020-09-11 DIAGNOSIS — Z888 Allergy status to other drugs, medicaments and biological substances status: Secondary | ICD-10-CM

## 2020-09-11 DIAGNOSIS — K219 Gastro-esophageal reflux disease without esophagitis: Secondary | ICD-10-CM | POA: Diagnosis present

## 2020-09-11 DIAGNOSIS — Z79899 Other long term (current) drug therapy: Secondary | ICD-10-CM

## 2020-09-11 DIAGNOSIS — R070 Pain in throat: Secondary | ICD-10-CM | POA: Diagnosis not present

## 2020-09-11 DIAGNOSIS — Z9842 Cataract extraction status, left eye: Secondary | ICD-10-CM

## 2020-09-11 DIAGNOSIS — N189 Chronic kidney disease, unspecified: Secondary | ICD-10-CM | POA: Diagnosis present

## 2020-09-11 DIAGNOSIS — Z800 Family history of malignant neoplasm of digestive organs: Secondary | ICD-10-CM

## 2020-09-11 HISTORY — PX: ANTERIOR CERVICAL DECOMP/DISCECTOMY FUSION: SHX1161

## 2020-09-11 LAB — GLUCOSE, CAPILLARY
Glucose-Capillary: 90 mg/dL (ref 70–99)
Glucose-Capillary: 90 mg/dL (ref 70–99)
Glucose-Capillary: 190 mg/dL — ABNORMAL HIGH (ref 70–99)
Glucose-Capillary: 161 mg/dL — ABNORMAL HIGH (ref 70–99)

## 2020-09-11 LAB — ABO/RH: ABO/RH(D): A POS

## 2020-09-11 SURGERY — ANTERIOR CERVICAL DECOMPRESSION/DISCECTOMY FUSION 2 LEVELS
Anesthesia: General | Site: Neck

## 2020-09-11 MED ORDER — EZETIMIBE 10 MG PO TABS
10.0000 mg | ORAL_TABLET | Freq: Every day | ORAL | Status: DC
  Administered 2020-09-12 – 2020-09-13 (×2): 10 mg via ORAL
  Filled 2020-09-11 (×2): qty 1

## 2020-09-11 MED ORDER — 0.9 % SODIUM CHLORIDE (POUR BTL) OPTIME
TOPICAL | Status: DC | PRN
  Administered 2020-09-11: 1000 mL

## 2020-09-11 MED ORDER — PHENOL 1.4 % MT LIQD: 1.0000 | OROMUCOSAL | Status: DC | PRN

## 2020-09-11 MED ORDER — FENTANYL CITRATE (PF) 250 MCG/5ML IJ SOLN
INTRAMUSCULAR | Status: DC | PRN
  Administered 2020-09-11 (×4): 50 ug via INTRAVENOUS

## 2020-09-11 MED ORDER — INSULIN ASPART 100 UNIT/ML IJ SOLN: 0.0000 [IU] | Freq: Every day | INTRAMUSCULAR | Status: DC

## 2020-09-11 MED ORDER — ONDANSETRON HCL 4 MG/2ML IJ SOLN: 4.0000 mg | Freq: Four times a day (QID) | INTRAMUSCULAR | Status: DC | PRN

## 2020-09-11 MED ORDER — DOCUSATE SODIUM 100 MG PO CAPS
100.0000 mg | ORAL_CAPSULE | Freq: Two times a day (BID) | ORAL | Status: DC
  Administered 2020-09-12 – 2020-09-13 (×3): 100 mg via ORAL
  Filled 2020-09-11 (×3): qty 1

## 2020-09-11 MED ORDER — LACTATED RINGERS IV SOLN: INTRAVENOUS | Status: DC

## 2020-09-11 MED ORDER — MUPIROCIN 2 % EX OINT: 1.0000 "application " | TOPICAL_OINTMENT | Freq: Two times a day (BID) | CUTANEOUS | Status: DC

## 2020-09-11 MED ORDER — THROMBIN 5000 UNITS EX SOLR: OROMUCOSAL | Status: DC | PRN

## 2020-09-11 MED ORDER — LIDOCAINE-EPINEPHRINE 1 %-1:100000 IJ SOLN
INTRAMUSCULAR | Status: DC | PRN
  Administered 2020-09-11: 1.5 mL

## 2020-09-11 MED ORDER — LINACLOTIDE 72 MCG PO CAPS
72.0000 ug | ORAL_CAPSULE | Freq: Every day | ORAL | Status: DC
  Administered 2020-09-12 – 2020-09-13 (×2): 72 ug via ORAL
  Filled 2020-09-11 (×2): qty 1

## 2020-09-11 MED ORDER — ONDANSETRON HCL 4 MG PO TABS: 4.0000 mg | ORAL_TABLET | Freq: Four times a day (QID) | ORAL | Status: DC | PRN

## 2020-09-11 MED ORDER — HYDROMORPHONE HCL 1 MG/ML IJ SOLN
0.5000 mg | INTRAMUSCULAR | Status: DC | PRN
  Administered 2020-09-11 – 2020-09-12 (×4): 0.5 mg via INTRAVENOUS
  Filled 2020-09-11 (×4): qty 0.5

## 2020-09-11 MED ORDER — SODIUM CHLORIDE 0.9% FLUSH: 3.0000 mL | INTRAVENOUS | Status: DC | PRN

## 2020-09-11 MED ORDER — LACTULOSE 10 GM/15ML PO SOLN: 20.0000 g | Freq: Three times a day (TID) | ORAL | Status: DC | PRN

## 2020-09-11 MED ORDER — MUPIROCIN 2 % EX OINT
TOPICAL_OINTMENT | CUTANEOUS | Status: AC
  Administered 2020-09-11: 1 via NASAL
  Filled 2020-09-11: qty 22

## 2020-09-11 MED ORDER — CHLORHEXIDINE GLUCONATE CLOTH 2 % EX PADS: 6.0000 | MEDICATED_PAD | Freq: Once | CUTANEOUS | Status: DC

## 2020-09-11 MED ORDER — HEMOSTATIC AGENTS (NO CHARGE) OPTIME
TOPICAL | Status: DC | PRN
  Administered 2020-09-11: 1 via TOPICAL

## 2020-09-11 MED ORDER — DRONEDARONE HCL 400 MG PO TABS
400.0000 mg | ORAL_TABLET | Freq: Two times a day (BID) | ORAL | Status: DC
  Administered 2020-09-12 – 2020-09-13 (×3): 400 mg via ORAL
  Filled 2020-09-11 (×3): qty 1

## 2020-09-11 MED ORDER — MENTHOL 3 MG MT LOZG: 1.0000 | LOZENGE | OROMUCOSAL | Status: DC | PRN

## 2020-09-11 MED ORDER — FLEET ENEMA 7-19 GM/118ML RE ENEM: 1.0000 | ENEMA | Freq: Once | RECTAL | Status: DC | PRN

## 2020-09-11 MED ORDER — HYDROCODONE-ACETAMINOPHEN 5-325 MG PO TABS: 1.0000 | ORAL_TABLET | ORAL | Status: DC | PRN

## 2020-09-11 MED ORDER — ALBUTEROL SULFATE (2.5 MG/3ML) 0.083% IN NEBU: 3.0000 mL | INHALATION_SOLUTION | Freq: Four times a day (QID) | RESPIRATORY_TRACT | Status: DC | PRN

## 2020-09-11 MED ORDER — SUGAMMADEX SODIUM 200 MG/2ML IV SOLN
INTRAVENOUS | Status: DC | PRN
  Administered 2020-09-11: 150 mg via INTRAVENOUS

## 2020-09-11 MED ORDER — ROCURONIUM BROMIDE 10 MG/ML (PF) SYRINGE
PREFILLED_SYRINGE | INTRAVENOUS | Status: DC | PRN
  Administered 2020-09-11: 10 mg via INTRAVENOUS
  Administered 2020-09-11: 60 mg via INTRAVENOUS

## 2020-09-11 MED ORDER — CYCLOBENZAPRINE HCL 10 MG PO TABS
10.0000 mg | ORAL_TABLET | Freq: Three times a day (TID) | ORAL | Status: DC | PRN
  Administered 2020-09-11: 10 mg via ORAL

## 2020-09-11 MED ORDER — FUROSEMIDE 20 MG PO TABS
20.0000 mg | ORAL_TABLET | Freq: Every day | ORAL | Status: DC
  Administered 2020-09-12 – 2020-09-13 (×2): 20 mg via ORAL
  Filled 2020-09-11 (×2): qty 1

## 2020-09-11 MED ORDER — LOSARTAN POTASSIUM 50 MG PO TABS
100.0000 mg | ORAL_TABLET | Freq: Every day | ORAL | Status: DC
  Administered 2020-09-12 – 2020-09-13 (×2): 100 mg via ORAL
  Filled 2020-09-11 (×2): qty 2

## 2020-09-11 MED ORDER — LIDOCAINE-EPINEPHRINE 1 %-1:100000 IJ SOLN
INTRAMUSCULAR | Status: AC
  Filled 2020-09-11: qty 1

## 2020-09-11 MED ORDER — AMLODIPINE BESYLATE 10 MG PO TABS
10.0000 mg | ORAL_TABLET | Freq: Every day | ORAL | Status: DC
  Administered 2020-09-12 – 2020-09-13 (×2): 10 mg via ORAL
  Filled 2020-09-11 (×2): qty 1

## 2020-09-11 MED ORDER — ALPRAZOLAM 0.25 MG PO TABS: 0.2500 mg | ORAL_TABLET | Freq: Three times a day (TID) | ORAL | Status: DC | PRN

## 2020-09-11 MED ORDER — CYCLOBENZAPRINE HCL 10 MG PO TABS
ORAL_TABLET | ORAL | Status: AC
  Filled 2020-09-11: qty 1

## 2020-09-11 MED ORDER — OXYCODONE HCL 5 MG/5ML PO SOLN: 5.0000 mg | Freq: Once | ORAL | Status: AC | PRN

## 2020-09-11 MED ORDER — FENTANYL CITRATE (PF) 100 MCG/2ML IJ SOLN
INTRAMUSCULAR | Status: AC
  Filled 2020-09-11: qty 2

## 2020-09-11 MED ORDER — FENTANYL CITRATE (PF) 100 MCG/2ML IJ SOLN
25.0000 ug | INTRAMUSCULAR | Status: DC | PRN
  Administered 2020-09-11 (×2): 25 ug via INTRAVENOUS
  Administered 2020-09-11: 50 ug via INTRAVENOUS
  Administered 2020-09-11 (×2): 25 ug via INTRAVENOUS

## 2020-09-11 MED ORDER — SODIUM CHLORIDE 0.9% FLUSH
3.0000 mL | Freq: Two times a day (BID) | INTRAVENOUS | Status: DC
  Administered 2020-09-12: 3 mL via INTRAVENOUS

## 2020-09-11 MED ORDER — PHENYLEPHRINE 40 MCG/ML (10ML) SYRINGE FOR IV PUSH (FOR BLOOD PRESSURE SUPPORT)
PREFILLED_SYRINGE | INTRAVENOUS | Status: DC | PRN
  Administered 2020-09-11 (×3): 80 ug via INTRAVENOUS
  Administered 2020-09-11: 120 ug via INTRAVENOUS

## 2020-09-11 MED ORDER — HYDROCODONE-ACETAMINOPHEN 5-325 MG PO TABS
2.0000 | ORAL_TABLET | ORAL | Status: DC | PRN
  Administered 2020-09-12 – 2020-09-13 (×3): 2 via ORAL
  Filled 2020-09-11 (×3): qty 2

## 2020-09-11 MED ORDER — INSULIN ASPART 100 UNIT/ML IJ SOLN
0.0000 [IU] | Freq: Three times a day (TID) | INTRAMUSCULAR | Status: DC
  Administered 2020-09-12: 2 [IU] via SUBCUTANEOUS
  Administered 2020-09-12: 3 [IU] via SUBCUTANEOUS
  Administered 2020-09-12: 2 [IU] via SUBCUTANEOUS

## 2020-09-11 MED ORDER — BUPIVACAINE HCL (PF) 0.5 % IJ SOLN
INTRAMUSCULAR | Status: AC
  Filled 2020-09-11: qty 30

## 2020-09-11 MED ORDER — OXYCODONE HCL 5 MG PO TABS
ORAL_TABLET | ORAL | Status: AC
  Filled 2020-09-11: qty 1

## 2020-09-11 MED ORDER — PEG-KCL-NACL-NASULF-NA ASC-C 140 G PO SOLR: 140.0000 g | ORAL | Status: DC

## 2020-09-11 MED ORDER — LIDOCAINE 2% (20 MG/ML) 5 ML SYRINGE
INTRAMUSCULAR | Status: DC | PRN
  Administered 2020-09-11: 60 mg via INTRAVENOUS

## 2020-09-11 MED ORDER — DEXAMETHASONE SODIUM PHOSPHATE 10 MG/ML IJ SOLN
INTRAMUSCULAR | Status: DC | PRN
  Administered 2020-09-11: 10 mg via INTRAVENOUS

## 2020-09-11 MED ORDER — ATORVASTATIN CALCIUM 80 MG PO TABS
80.0000 mg | ORAL_TABLET | Freq: Every day | ORAL | Status: DC
  Administered 2020-09-12 – 2020-09-13 (×2): 80 mg via ORAL
  Filled 2020-09-11 (×2): qty 1

## 2020-09-11 MED ORDER — VANCOMYCIN HCL 750 MG/150ML IV SOLN
750.0000 mg | Freq: Two times a day (BID) | INTRAVENOUS | Status: AC
  Administered 2020-09-11 – 2020-09-12 (×2): 750 mg via INTRAVENOUS
  Filled 2020-09-11 (×2): qty 150

## 2020-09-11 MED ORDER — PROPOFOL 10 MG/ML IV BOLUS
INTRAVENOUS | Status: DC | PRN
  Administered 2020-09-11: 200 mg via INTRAVENOUS

## 2020-09-11 MED ORDER — HYDROCHLOROTHIAZIDE 25 MG PO TABS
25.0000 mg | ORAL_TABLET | Freq: Every day | ORAL | Status: DC
  Administered 2020-09-12 – 2020-09-13 (×2): 25 mg via ORAL
  Filled 2020-09-11 (×2): qty 1

## 2020-09-11 MED ORDER — ORAL CARE MOUTH RINSE: 15.0000 mL | Freq: Once | OROMUCOSAL | Status: AC

## 2020-09-11 MED ORDER — POTASSIUM CHLORIDE IN NACL 20-0.9 MEQ/L-% IV SOLN
INTRAVENOUS | Status: DC
  Filled 2020-09-11: qty 1000

## 2020-09-11 MED ORDER — METFORMIN HCL ER 500 MG PO TB24
500.0000 mg | ORAL_TABLET | Freq: Every day | ORAL | Status: DC
  Administered 2020-09-12: 500 mg via ORAL
  Filled 2020-09-11 (×2): qty 1

## 2020-09-11 MED ORDER — PROPOFOL 10 MG/ML IV BOLUS
INTRAVENOUS | Status: AC
  Filled 2020-09-11: qty 20

## 2020-09-11 MED ORDER — CHLORHEXIDINE GLUCONATE 0.12 % MT SOLN
15.0000 mL | Freq: Once | OROMUCOSAL | Status: AC
  Administered 2020-09-11: 15 mL via OROMUCOSAL
  Filled 2020-09-11: qty 15

## 2020-09-11 MED ORDER — VANCOMYCIN HCL IN DEXTROSE 1-5 GM/200ML-% IV SOLN
1000.0000 mg | INTRAVENOUS | Status: AC
  Administered 2020-09-11: 1000 mg via INTRAVENOUS
  Filled 2020-09-11: qty 200

## 2020-09-11 MED ORDER — PIRFENIDONE 801 MG PO TABS: 1.0000 | ORAL_TABLET | Freq: Three times a day (TID) | ORAL | Status: DC

## 2020-09-11 MED ORDER — ACETAMINOPHEN 650 MG RE SUPP: 650.0000 mg | RECTAL | Status: DC | PRN

## 2020-09-11 MED ORDER — THROMBIN 5000 UNITS EX SOLR
CUTANEOUS | Status: DC | PRN
  Administered 2020-09-11 (×2): 5000 [IU] via TOPICAL

## 2020-09-11 MED ORDER — THROMBIN 5000 UNITS EX SOLR
CUTANEOUS | Status: AC
  Filled 2020-09-11: qty 15000

## 2020-09-11 MED ORDER — FENTANYL CITRATE (PF) 250 MCG/5ML IJ SOLN
INTRAMUSCULAR | Status: AC
  Filled 2020-09-11: qty 5

## 2020-09-11 MED ORDER — SENNA 8.6 MG PO TABS: 1.0000 | ORAL_TABLET | Freq: Every day | ORAL | Status: DC | PRN

## 2020-09-11 MED ORDER — ONDANSETRON HCL 4 MG/2ML IJ SOLN
INTRAMUSCULAR | Status: DC | PRN
  Administered 2020-09-11: 4 mg via INTRAVENOUS

## 2020-09-11 MED ORDER — BUPIVACAINE HCL 0.5 % IJ SOLN
INTRAMUSCULAR | Status: DC | PRN
  Administered 2020-09-11: 1.5 mL

## 2020-09-11 MED ORDER — POLYETHYLENE GLYCOL 3350 17 G PO PACK: 17.0000 g | PACK | Freq: Every day | ORAL | Status: DC | PRN

## 2020-09-11 MED ORDER — ACETAMINOPHEN 325 MG PO TABS: 650.0000 mg | ORAL_TABLET | ORAL | Status: DC | PRN

## 2020-09-11 MED ORDER — SODIUM CHLORIDE 0.9 % IV SOLN: 250.0000 mL | INTRAVENOUS | Status: DC

## 2020-09-11 MED ORDER — OXYCODONE HCL 5 MG PO TABS
5.0000 mg | ORAL_TABLET | Freq: Once | ORAL | Status: AC | PRN
  Administered 2020-09-11: 5 mg via ORAL

## 2020-09-11 SURGICAL SUPPLY — 65 items
BAG COUNTER SPONGE SURGICOUNT (BAG) ×4 IMPLANT
BAND RUBBER #18 3X1/16 STRL (MISCELLANEOUS) ×4 IMPLANT
BASKET BONE COLLECTION (BASKET) ×2 IMPLANT
BENZOIN TINCTURE PRP APPL 2/3 (GAUZE/BANDAGES/DRESSINGS) ×2 IMPLANT
BIT DRILL NEURO 2X3.1 SFT TUCH (MISCELLANEOUS) ×1 IMPLANT
BLADE CLIPPER SURG (BLADE) IMPLANT
BLADE SURG 15 STRL LF DISP TIS (BLADE) ×1 IMPLANT
BLADE SURG 15 STRL SS (BLADE) ×2
BLADE ULTRA TIP 2M (BLADE) IMPLANT
BUR MATCHSTICK NEURO 3.0 LAGG (BURR) ×2 IMPLANT
CANISTER SUCT 3000ML PPV (MISCELLANEOUS) ×2 IMPLANT
DECANTER SPIKE VIAL GLASS SM (MISCELLANEOUS) IMPLANT
DEVICE ENDSKLTN TC NANOLCK 6MM (Cage) ×2 IMPLANT
DRAPE C-ARM 42X72 X-RAY (DRAPES) ×4 IMPLANT
DRAPE HALF SHEET 40X57 (DRAPES) IMPLANT
DRAPE INCISE 23X17 IOBAN STRL (DRAPES) ×1
DRAPE INCISE IOBAN 23X17 STRL (DRAPES) ×1 IMPLANT
DRAPE LAPAROTOMY 100X72 PEDS (DRAPES) ×2 IMPLANT
DRAPE MICROSCOPE LEICA (MISCELLANEOUS) ×2 IMPLANT
DRESSING MEPILEX FLEX 4X4 (GAUZE/BANDAGES/DRESSINGS) IMPLANT
DRILL NEURO 2X3.1 SOFT TOUCH (MISCELLANEOUS) ×2
DRSG MEPILEX FLEX 4X4 (GAUZE/BANDAGES/DRESSINGS)
DRSG OPSITE 4X5.5 SM (GAUZE/BANDAGES/DRESSINGS) IMPLANT
DRSG OPSITE POSTOP 4X6 (GAUZE/BANDAGES/DRESSINGS) ×2 IMPLANT
DURAPREP 26ML APPLICATOR (WOUND CARE) IMPLANT
ELECT COATED BLADE 2.86 ST (ELECTRODE) ×2 IMPLANT
ELECT REM PT RETURN 9FT ADLT (ELECTROSURGICAL) ×2
ELECTRODE REM PT RTRN 9FT ADLT (ELECTROSURGICAL) ×1 IMPLANT
ENDOSKELETON TC NANOLOCK 6MM (Cage) ×4 IMPLANT
GAUZE 4X4 16PLY ~~LOC~~+RFID DBL (SPONGE) ×2 IMPLANT
GLOVE SURG LTX SZ7.5 (GLOVE) ×2 IMPLANT
GLOVE SURG UNDER POLY LF SZ7.5 (GLOVE) ×2 IMPLANT
GOWN STRL REUS W/ TWL LRG LVL3 (GOWN DISPOSABLE) ×2 IMPLANT
GOWN STRL REUS W/ TWL XL LVL3 (GOWN DISPOSABLE) ×2 IMPLANT
GOWN STRL REUS W/TWL 2XL LVL3 (GOWN DISPOSABLE) IMPLANT
GOWN STRL REUS W/TWL LRG LVL3 (GOWN DISPOSABLE) ×4
GOWN STRL REUS W/TWL XL LVL3 (GOWN DISPOSABLE) ×4
HEMOSTAT POWDER KIT SURGIFOAM (HEMOSTASIS) ×2 IMPLANT
KIT BASIN OR (CUSTOM PROCEDURE TRAY) ×2 IMPLANT
KIT TURNOVER KIT B (KITS) ×2 IMPLANT
MARKER SKIN DUAL TIP RULER LAB (MISCELLANEOUS) ×2 IMPLANT
NEEDLE HYPO 22GX1.5 SAFETY (NEEDLE) ×2 IMPLANT
NEEDLE SPNL 22GX3.5 QUINCKE BK (NEEDLE) ×2 IMPLANT
NS IRRIG 1000ML POUR BTL (IV SOLUTION) ×2 IMPLANT
PACK LAMINECTOMY NEURO (CUSTOM PROCEDURE TRAY) ×2 IMPLANT
PAD ARMBOARD 7.5X6 YLW CONV (MISCELLANEOUS) ×6 IMPLANT
PIN DISTRACTION 14MM (PIN) IMPLANT
PLATE ZEVO 2LVL 37MM (Plate) ×2 IMPLANT
PUTTY DBF 1CC CORTICAL FIBERS (Putty) ×2 IMPLANT
SCREW 4.0 SELFDRILL 17MM VARI (Screw) ×2 IMPLANT
SCREW VA SD 3.5X16 (Screw) ×12 IMPLANT
SPONGE INTESTINAL PEANUT (DISPOSABLE) ×2 IMPLANT
SPONGE SURGIFOAM ABS GEL SZ50 (HEMOSTASIS) ×2 IMPLANT
STAPLER VISISTAT 35W (STAPLE) IMPLANT
STRIP CLOSURE SKIN 1/2X4 (GAUZE/BANDAGES/DRESSINGS) ×2 IMPLANT
SUT MNCRL AB 4-0 PS2 18 (SUTURE) ×2 IMPLANT
SUT SILK 2 0 TIES 10X30 (SUTURE) ×2 IMPLANT
SUT VIC AB 0 CT1 27 (SUTURE) ×2
SUT VIC AB 0 CT1 27XBRD ANTBC (SUTURE) ×1 IMPLANT
SUT VIC AB 3-0 SH 8-18 (SUTURE) ×2 IMPLANT
TAPE CLOTH 3X10 TAN LF (GAUZE/BANDAGES/DRESSINGS) IMPLANT
TIP KERRISON THIN FOOTPLATE 2M (MISCELLANEOUS) ×2 IMPLANT
TOWEL GREEN STERILE (TOWEL DISPOSABLE) ×2 IMPLANT
TOWEL GREEN STERILE FF (TOWEL DISPOSABLE) ×2 IMPLANT
WATER STERILE IRR 1000ML POUR (IV SOLUTION) ×2 IMPLANT

## 2020-09-11 NOTE — H&P (Signed)
CC: cervical radiculopathy  HPI:     Patient is a 75 y.o. female presented to clinic with right arm radiculopathy.  The pain arose in the right side of her neck and went down her arm to her hand and was worse with moving her neck.  She developed significant weakness in her arm as well.  Nonsurgical measures and therapies fail to improve her symptoms.    Patient Active Problem List   Diagnosis Date Noted   Vitamin D deficiency 12/05/2019   Healthcare maintenance 11/09/2018   IPF (idiopathic pulmonary fibrosis) ([place]) 11/09/2018   Therapeutic drug monitoring 04/20/2018   Paroxysmal atrial fibrillation ([place]) 04/20/2018   Atrial fibrillation with RVR ([place]) 11/03/2017   LVH (left ventricular hypertrophy) 09/22/2017   Diabetes mellitus without complication ([place]) 16/03/930   Degeneration of lumbar intervertebral disc 07/06/2017   Pulmonary emboli ([place]) 05/02/2016   Right leg pain 05/01/2016   ILD (interstitial lung disease) ([place]) 05/01/2016   Bradycardia 05/01/2016   Coronary artery calcification seen on CAT scan 08/06/2015   Open angle with borderline findings, low risk, bilateral 04/29/2015   UIP (usual interstitial pneumonitis) ([place]) 04/05/2015   Bilateral posterior capsular opacification 09/21/2014   Pseudophakia of both eyes 07/27/2014   Combined form of senile cataract of both eyes 05/30/2014   Osteoarthritis 09/17/2011   Narrowing of intervertebral disc space 09/17/2011   Pyelonephritis with possible nonobstructing 5 mm calculus by renal ultrasound 09/16/2011   Hypokalemia 09/16/2011   External hemorrhoids 09/13/2011   Obstructive sleep apnea syndrome 06/10/2011   Anal fissure 02/17/2011   History of colonic polyps 04/03/2010   Respiratory condition due to chemical fumes and vapors ([place]) 01/20/2010   Urinary incontinence 04/30/2009   Insomnia 09/28/2007   Asthma 11/22/2006   Dyslipidemia 08/19/2006   HTN (hypertension) 08/19/2006   GERD 08/19/2006   Low back pain  08/19/2006   Past Medical History:  Diagnosis Date   Anxiety    Arthritis    Asthma    Atrial fibrillation (HCC)    CHF (congestive heart failure) ([place])    pt. unsure- but thinks she was hosp. for CHF- 2002   [facility]   Clotting disorder ([place])    blood clots in lungs/PE pulmonary embolism   Colon polyps    Complication of anesthesia    states requires a lot med. to put her to sleep    DDD (degenerative disc disease) 09/17/2011   Depression    "sometimes "   DM (diabetes mellitus) ([place])    Family history of anesthesia complication    Sister had difficulty waking up from anesthesia   GERD (gastroesophageal reflux disease)    Glaucoma    bilateral, pt. admits that she is noncompliant to eye gtts.    Hemorrhoids    Hyperlipidemia    Hypertension    had stress, echo- 2006 /w [place], Cardiac Cath, per pt. 2002, echo repeated 2012- wnl    IPF (idiopathic pulmonary fibrosis) (HCC)    Low back pain    Shortness of breath    Sleep apnea    uses c-pap- q night recently    Past Surgical History:  Procedure Laterality Date   ABDOMINAL HYSTERECTOMY     ectopic, fibroids   arm surgery Right    CARDIAC CATHETERIZATION     CATARACT EXTRACTION, BILATERAL     cataracts removed bilateral- ?IOL   COLONOSCOPY     remote   FISSURECTOMY  10/08/2011   Procedure:  FISSURECTOMY;  Surgeon: Stark Klein, MD;  Location: [facility] OR;  Service: General;  Laterality: N/A;   FLEXIBLE SIGMOIDOSCOPY  02/25/2011   Procedure: Beryle Quant;  Surgeon: Inda Castle, MD;  Location: [facility] ENDOSCOPY;  Service: Endoscopy;  Laterality: N/A;   FOOT SURGERY     bilat, heel spurs- screw in R foot    HEMORRHOID SURGERY  10/08/2011   Procedure: HEMORRHOIDECTOMY;  Surgeon: Stark Klein, MD;  Location: [facility];  Service: General;  Laterality: N/A;  External    SPHINCTEROTOMY  10/08/2011   Procedure: Joan Mayans;  Surgeon: Stark Klein, MD;  Location: [facility];  Service:  General;  Laterality: N/A;    Medications Prior to Admission  Medication Sig Dispense Refill Last Dose   acetaminophen (TYLENOL) 325 MG tablet Take 650 mg by mouth every 6 (six) hours as needed for moderate pain.   Past Week   albuterol (VENTOLIN HFA) 108 (90 Base) MCG/ACT inhaler Inhale 1 puff into the lungs every 6 (six) hours as needed. (Patient taking differently: Inhale 1 puff into the lungs every 6 (six) hours as needed for shortness of breath or wheezing.) 18 g 1 Past Month   ALPRAZolam (XANAX) 0.25 MG tablet Take 1 tablet (0.25 mg total) by mouth 3 (three) times daily as needed. for anxiety 90 tablet 0 09/11/2020 at 0800   amLODipine (NORVASC) 10 MG tablet Take 1 tablet (10 mg total) by mouth daily. 90 tablet 1 09/11/2020 at 0800   atorvastatin (LIPITOR) 80 MG tablet Take 1 tablet (80 mg total) by mouth daily. 90 tablet 1 09/11/2020 at 800   cyclobenzaprine (FLEXERIL) 5 MG tablet TAKE ONE TABLET BY MOUTH EVERYDAY AT BEDTIME (Patient taking differently: Take 5 mg by mouth at bedtime.) 71 tablet 1 09/10/2020   diclofenac sodium (VOLTAREN) 1 % GEL Apply 2 g topically 4 (four) times daily. (Patient taking differently: Apply 2 g topically daily as needed (pain).) 1 Tube 1 Past Month   dronedarone (MULTAQ) 400 MG tablet TAKE 1 TABLET BY MOUTH TWICE DAILY WITH A MEAL (Patient taking differently: Take 400 mg by mouth 2 (two) times daily with a meal.) 180 tablet 3 09/10/2020   ezetimibe (ZETIA) 10 MG tablet Take 1 tablet (10 mg total) by mouth daily. 90 tablet 1 09/11/2020 at 0800   furosemide (LASIX) 40 MG tablet Take 1/2 (one-half) tablet by mouth once daily (Patient taking differently: Take 20 mg by mouth daily.) 45 tablet 1 09/10/2020   hydrochlorothiazide (HYDRODIURIL) 25 MG tablet Take 1 tablet (25 mg total) by mouth daily. 90 tablet 1 09/10/2020   HYDROcodone-acetaminophen (NORCO/VICODIN) 5-325 MG tablet Take 1 tablet by mouth every 6 (six) hours as needed for moderate pain. 90 tablet 0 09/10/2020    lidocaine (LIDODERM) 5 % Place 1 patch onto the skin daily. Remove & Discard patch within 12 hours or as directed by MD 15 patch 0 Past Month   linaclotide (LINZESS) 72 MCG capsule Take 72 mcg by mouth daily before breakfast.   Past Week   losartan (COZAAR) 100 MG tablet Take 1 tablet (100 mg total) by mouth daily. 90 tablet 1    metFORMIN (GLUCOPHAGE-XR) 500 MG 24 hr tablet Take 1 tablet (500 mg total) by mouth daily with breakfast. 90 tablet 1 09/10/2020   PEG-KCl-NaCl-NaSulf-Na Asc-C (PLENVU) 140 g SOLR Take 140 g by mouth as directed. Manufacturer's coupon Universal coupon code:BIN: P2366821; GROUP: [id number]; PCN: [id number]; ID: [id number]; PAY NO MORE $50 1 each 0 Past Week   Pirfenidone (ESBRIET) 801 MG  TABS Take 1 tablet by mouth 3 (three) times daily. (Patient taking differently: Take 801 mg by mouth 3 (three) times daily.) 90 tablet 10 09/11/2020   rivaroxaban (XARELTO) 20 MG TABS tablet TAKE 1 TABLET BY MOUTH ONCE DAILY WITH SUPPER (Patient taking differently: Take 20 mg by mouth daily with supper.) 90 tablet 1 09/04/2020   Accu-Chek Softclix Lancets lancets Use as instructed 100 each 12    Continuous Blood Gluc Receiver (FREESTYLE LIBRE 14 DAY READER) DEVI 1 each by Does not apply route daily. 1 each 2    Continuous Blood Gluc Sensor (FREESTYLE LIBRE 14 DAY SENSOR) MISC USE 1  ONCE DAILY 2 each 4    glucose blood (ACCU-CHEK AVIVA PLUS) test strip Use once daily for glucose control.  Dx E11.9  For accu-chek aviva 100 each 12    glucose blood (ACCU-CHEK GUIDE) test strip Use as instructed 100 each 12    HYDROcodone-acetaminophen (NORCO/VICODIN) 5-325 MG tablet Take 1 tablet by mouth every 6 (six) hours as needed for moderate pain. 90 tablet 0    HYDROcodone-acetaminophen (NORCO/VICODIN) 5-325 MG tablet Take 1 tablet by mouth every 6 (six) hours as needed for moderate pain. 90 tablet 0    lactulose (CONSTULOSE) 10 GM/15ML solution TAKE 30 MILLILITERS BY MOUTH  THREE TIMES DAILY AS NEEDED FOR MILD  CONSTIPATION (Patient taking differently: Take 20 g by mouth 3 (three) times daily as needed for moderate constipation.) 473 mL 1 Unknown   polyethylene glycol powder (GLYCOLAX/MIRALAX) powder Take 17 g by mouth daily. (Patient not taking: No sig reported) 3350 g 1 Not Taking   senna (SENOKOT) 8.6 MG TABS tablet Take 1 tablet by mouth daily as needed for mild constipation.   Unknown   Allergies  Allergen Reactions   Fish Oil Anaphylaxis   Other Hives, Shortness Of Breath and Swelling    Allergic to cashew nuts and peanut oil.   Penicillins Anaphylaxis, Hives and Swelling    Has patient had a PCN reaction causing immediate rash, facial/tongue/throat swelling, SOB or lightheadedness with hypotension: Face swelling and hives started first, then swelling of the throat  Has patient had a PCN reaction causing severe rash involving mucus membranes or skin necrosis: Yes  Has patient had a PCN reaction that required hospitalization: No  Has patient had a PCN reaction occurring within the last 10 years: Yes  If all of the above answers are "NO", then may proceed with Cephalospor   Pneumococcal Vaccines Nausea And Vomiting   Cashew Nut Oil    Nitrofurantoin Macrocrystal Hives   Peanut Oil    Aspirin Itching and Rash   Bactrim [Sulfamethoxazole-Trimethoprim] Hives, Itching and Rash   Ciprofloxacin Hives, Itching and Rash   Ibuprofen Rash   Influenza Vaccines Hives   Ivp Dye [Iodinated Diagnostic Agents] Hives, Itching and Rash    Gives benadryl to counteract symptoms   Latex Rash   Macrobid [Nitrofurantoin Monohydrate Macrocrystals] Hives   Shellfish Allergy Hives    Patient also allergic to seafood    Social History   Tobacco Use   Smoking status: Never   Smokeless tobacco: Never  Substance Use Topics   Alcohol use: No    Alcohol/week: 0.0 standard drinks    Family History  Problem Relation Age of Onset   Heart attack Mother    Heart disease Mother    Breast cancer Mother     Emphysema Sister    Breast cancer Sister    Arthritis/Rheumatoid Sister    Asthma Sister  Lung cancer Sister    COPD Sister    Colon cancer Brother    Colon cancer Brother    Anesthesia problems Neg Hx      Review of Systems Pertinent items noted in HPI and remainder of comprehensive ROS otherwise negative.  Objective:   Patient Vitals for the past 8 hrs:  BP Temp Temp src Pulse Resp SpO2 Height Weight  09/11/20 1038 (!) 168/69 98.6 F (37 C) Oral 62 17 99 % 5\' 5"  (7.342 m) 80.3 kg   No intake/output data recorded. No intake/output data recorded.      General : Alert, cooperative, no distress, appears stated age   Head:  Normocephalic/atraumatic    Eyes: PERRL, conjunctiva/corneas clear, EOM's intact. Fundi could not be visualized Neck: Supple Chest:  Respirations unlabored Chest wall: no tenderness or deformity Heart: Regular rate and rhythm Abdomen: Soft, nontender and nondistended Extremities: warm and well-perfused Skin: normal turgor, color and texture Neurologic:  Alert, oriented x 3.  Eyes open spontaneously. PERRL, EOMI, VFC, no facial droop. V1-3 intact.  No dysarthria, tongue protrusion symmetric.  CNII-XII intact.  Positive right Spurling's.  Biceps 4+/5 and grip strength 4+/5  on right.  No pronator drift, full strength in legs       Data ReviewCBC:  Lab Results  Component Value Date   WBC 6.3 09/09/2020   RBC 4.58 09/09/2020   BMP:  Lab Results  Component Value Date   GLUCOSE 86 09/09/2020   CO2 28 09/09/2020   BUN 17 09/09/2020   CREATININE 0.93 09/09/2020   CREATININE 0.93 12/01/2019   CALCIUM 9.7 09/09/2020   Radiology review:  MRI shows multilevel disc disease with severe right foraminal stenosis at C4-5 and moderate to severe stenosis at C5-6.  Assessment:  C4-5 and C5-6 stenosis with associated radiculopathy Plan:   - plan for C4-5 and C5-6 ACDFs today - risks, benefits, alternatives and expected convalescence were  discussed.

## 2020-09-11 NOTE — Transfer of Care (Signed)
Immediate Anesthesia Transfer of Care Note  Patient: Miranda [name]  Procedure(s) Performed: Anterior Cervical Decompression Fusion Cervical four-five, Cervical five-six (Neck)  Patient Location: PACU  Anesthesia Type:General  Level of Consciousness: awake and patient cooperative  Airway & Oxygen Therapy: Patient Spontanous Breathing and Patient connected to nasal cannula oxygen  Post-op Assessment: Report given to RN and Post -op Vital signs reviewed and stable  Post vital signs: Reviewed and stable  Last Vitals:  Vitals Value Taken Time  BP 160/61 09/11/20 1649  Temp    Pulse 90 09/11/20 1649  Resp 18 09/11/20 1649  SpO2 100 % 09/11/20 1649  Vitals shown include unvalidated device data.  Last Pain:  Vitals:   09/11/20 1139  TempSrc:   PainSc: 5       Patients Stated Pain Goal: 3 (98/72/15 8727)  Complications: No notable events documented.

## 2020-09-11 NOTE — Anesthesia Procedure Notes (Signed)
Procedure Name: Intubation Date/Time: 09/11/2020 1:00 PM Performed by: Eulas Post, Merinda Victorino W, CRNA Pre-anesthesia Checklist: Patient identified, Emergency Drugs available, Suction available and Patient being monitored Patient Re-evaluated:Patient Re-evaluated prior to induction Oxygen Delivery Method: Circle system utilized Preoxygenation: Pre-oxygenation with 100% oxygen Induction Type: IV induction Ventilation: Mask ventilation without difficulty Laryngoscope Size: Miller and 2 Grade View: Grade I Tube type: Oral Tube size: 7.0 mm Number of attempts: 1 Airway Equipment and Method: Stylet and Oral airway Placement Confirmation: ETT inserted through vocal cords under direct vision, positive ETCO2 and breath sounds checked- equal and bilateral Secured at: 24 cm Tube secured with: Tape Dental Injury: Teeth and Oropharynx as per pre-operative assessment

## 2020-09-11 NOTE — Op Note (Addendum)
PREOP DIAGNOSIS: Cervical radiculopathy  POSTOP DIAGNOSIS: Cervical radiculopathy   PROCEDURE: 1. Arthrodesis C4-5, anterior interbody technique, including Discectomy for decompression of spinal cord and exiting nerve roots with foraminotomies  2. Arthrodesis, additional level C5-6 anterior interbody technique, including Discectomy for decompression of spinal cord and exiting nerve roots with foraminotomies  3. Placement of intervertebral biomechanical device C4-5 4. Placement of intervertebral biomechanical device C5-6 5. Placement of anterior instrumentation consisting of interbody plate and screws T5-5-7 6. Use of morselized bone allograft  7. Use of intraoperative microscope  SURGEON: Dr. Duffy Rhody, MD  ASSISTANT: Elwin Sleight, DO.  Please note, no qualified trainees were available to assist with the procedure.  Assistance was required for retraction of the visceral structures to safely allow for instrumentation.  ANESTHESIA: General Endotracheal  EBL: 50 ml  IMPLANTS: Medtronic Titan C medium cage 6 mm x2 37 mm Zevo plate 16 mm screws  SPECIMENS: None  DRAINS: None  COMPLICATIONS: None immediate  CONDITION: Hemodynamically stable to PACU  HISTORY: This is a 75 year old woman with atrial fibrillation who developed severe right sided cervical radiculopathy With associated arm weakness.  She was found to have severe degenerative disease with foraminal stenosis on the right side at C4-5 and C5-6, with mild to moderate degenerative disease elsewhere.  Nonsurgical therapies fail to help her pain which was progressive.  I therefore discussed with her possibility of two-level ACDF.  Anterior approach was preferred given the anterior pathology.  risks, benefits, alternatives, and expected convalescence were discussed with the patient.  Risks discussed included but were not limited to bleeding, pain, infection, pseudoarthrosis, hardware failure, adjacent segment disease, CSF  leak, neurologic deficits, weakness, numbness, paralysis, coma, and death. After all questions were answered, informed consent was obtained.  PROCEDURE IN DETAIL: The patient was brought to the operating room and transferred to the operative table. After induction of general anesthesia, the patient was positioned on the operative table in the supine position with all pressure points meticulously padded. The skin of the neck was then prepped and draped in the usual sterile fashion.  After timeout was conducted, the skin was infiltrated with local anesthetic. Skin incision was then made sharply and Bovie electrocautery was used to dissect the subcutaneous tissue until the platysma was identified. The platysma was then divided and undermined. The sternocleidomastoid muscle was then identified and, utilizing natural fascial planes in the neck, the prevertebral fascia was identified and the carotid sheath was retracted laterally and the trachea and esophagus retracted medially. Again using fluoroscopy, the C4-5 disc space was identified. Bovie electrocautery was used to dissect in the subperiosteal plane and elevate the bilateral longus coli muscles. Self-retaining retractors were then placed. Caspar distraction pins were placed in the adjacent bodies to allow for gentle distraction.  At this point, the microscope was draped and brought into the field, and the remainder of the case was done under the microscope using microdissecting technique.  The disc space was incised sharply and combination of high speed drill, curettes, and rongeurs were use to initially complete a discectomy. The high-speed drill was then used to complete discectomy until the posterior annulus was identified and removed and the posterior longitudinal ligament was identified. Using a nerve hook, the PLL was elevated, and Kerrison rongeurs were used to remove the posterior longitudinal ligament and the ventral thecal sac was identified. Using  a combination of curettes and rongeurs, complete decompression of the thecal sac and exiting nerve roots at this level was completed, and verified with  easy passage of micro-nerve hook centrally and in the bilateral foramina.  Having completed our decompression, attention was turned to placement of the intervertebral device. Trial spacers were used to select a size 6 mm graft. This graft was then filled with morcellized allograft, and inserted under live fluoroscopy.  Attention was then turned to the C5-6 level. Caspar distraction pin was placed in the adjacent body to allow for gentle distraction of the disc space.  In a similar fashion, discectomy was completed initially with curettes and rongeurs, and completed with the drill. The PLL was again identified, elevated and incised. Using Kerrison rongeurs, decompression of the spinal cord and exiting roots was completed and confirmed with a dissector. Trial spacers were used to select a 6 mm graft. This graft was then filled with morcellized allograft, and inserted under live fluoroscopy.  After placement of the intervertebral devices, the caspar pins were removed.  An anterior cervical plate was placed across the interspaces for anterior fixation.  Using a high-speed drill, the cortex of the cervical vertebral bodies was punctured, and screws inserted in the vertebral bodies. The purchase of the right C6 screw did not appear secure, so this was replaced with a 17 mm 4.0 rescue screw with better purchase. Final fluoroscopic images in AP and lateral projections were taken to confirm good hardware placement.  At this point, after all counts were verified to be correct, meticulous hemostasis was secured using a combination of bipolar electrocautery and passive hemostatics. The platysma muscle was then closed using interrupted 3-0 Vicryl sutures, and the skin was closed with a 4-0 monocryl in subcutical fashion. Sterile dressings were then applied and the drapes  removed.  The patient tolerated the procedure well and was extubated in the room and taken to the postanesthesia care unit in stable condition.  All counts were correct at the end of the procedure.

## 2020-09-11 NOTE — Progress Notes (Signed)
Pharmacy Antibiotic Note  Miranda [name] is a 75 y.o. female admitted on 09/11/2020 for spinal surgery.  Pharmacy has been consulted for vancomycin x 2 doses for surgical prophylaxis.  Pre-op vancomycin 1gm IV given around 12N today.  SCr 0.93, CrCL 56 ml/min.  Plan: Vanc 750mg  IV Q12H x 2 doses Pharmacy will sign off.  Thank you for the consult!  Height: 5\' 5"  (165.1 cm) Weight: 80.3 kg (177 lb) IBW/kg (Calculated) : 57  Temp (24hrs), Avg:98 F (36.7 C), Min:97.7 F (36.5 C), Max:98.6 F (37 C)  Recent Labs  Lab 09/09/20 1047  WBC 6.3  CREATININE 0.93    Estimated Creatinine Clearance: 56.4 mL/min (by C-G formula based on SCr of 0.93 mg/dL).    Allergies  Allergen Reactions   Fish Oil Anaphylaxis   Other Hives, Shortness Of Breath and Swelling    Allergic to cashew nuts and peanut oil.   Penicillins Anaphylaxis, Hives and Swelling    Has patient had a PCN reaction causing immediate rash, facial/tongue/throat swelling, SOB or lightheadedness with hypotension: Face swelling and hives started first, then swelling of the throat  Has patient had a PCN reaction causing severe rash involving mucus membranes or skin necrosis: Yes  Has patient had a PCN reaction that required hospitalization: No  Has patient had a PCN reaction occurring within the last 10 years: Yes  If all of the above answers are "NO", then may proceed with Cephalospor   Pneumococcal Vaccines Nausea And Vomiting   Cashew Nut Oil    Nitrofurantoin Macrocrystal Hives   Peanut Oil    Aspirin Itching and Rash   Bactrim [Sulfamethoxazole-Trimethoprim] Hives, Itching and Rash   Ciprofloxacin Hives, Itching and Rash   Ibuprofen Rash   Influenza Vaccines Hives   Ivp Dye [Iodinated Diagnostic Agents] Hives, Itching and Rash    Gives benadryl to counteract symptoms   Latex Rash   Macrobid [Nitrofurantoin Monohydrate Macrocrystals] Hives   Shellfish Allergy Hives    Patient also allergic to seafood    Yaneliz Radebaugh D.  Mina Marble, PharmD, BCPS, [place] 09/11/2020, 9:01 PM

## 2020-09-12 ENCOUNTER — Encounter (HOSPITAL_COMMUNITY): Payer: Self-pay | Admitting: Neurosurgery

## 2020-09-12 DIAGNOSIS — Z20822 Contact with and (suspected) exposure to covid-19: Secondary | ICD-10-CM | POA: Diagnosis present

## 2020-09-12 DIAGNOSIS — E1122 Type 2 diabetes mellitus with diabetic chronic kidney disease: Secondary | ICD-10-CM | POA: Diagnosis present

## 2020-09-12 DIAGNOSIS — J45909 Unspecified asthma, uncomplicated: Secondary | ICD-10-CM | POA: Diagnosis present

## 2020-09-12 DIAGNOSIS — Z91013 Allergy to seafood: Secondary | ICD-10-CM | POA: Diagnosis not present

## 2020-09-12 DIAGNOSIS — R490 Dysphonia: Secondary | ICD-10-CM | POA: Diagnosis not present

## 2020-09-12 DIAGNOSIS — Z01812 Encounter for preprocedural laboratory examination: Secondary | ICD-10-CM | POA: Diagnosis not present

## 2020-09-12 DIAGNOSIS — Z880 Allergy status to penicillin: Secondary | ICD-10-CM | POA: Diagnosis not present

## 2020-09-12 DIAGNOSIS — F419 Anxiety disorder, unspecified: Secondary | ICD-10-CM | POA: Diagnosis present

## 2020-09-12 DIAGNOSIS — Z9842 Cataract extraction status, left eye: Secondary | ICD-10-CM | POA: Diagnosis not present

## 2020-09-12 DIAGNOSIS — I130 Hypertensive heart and chronic kidney disease with heart failure and stage 1 through stage 4 chronic kidney disease, or unspecified chronic kidney disease: Secondary | ICD-10-CM | POA: Diagnosis present

## 2020-09-12 DIAGNOSIS — Z86010 Personal history of colonic polyps: Secondary | ICD-10-CM | POA: Diagnosis not present

## 2020-09-12 DIAGNOSIS — M5412 Radiculopathy, cervical region: Secondary | ICD-10-CM | POA: Diagnosis present

## 2020-09-12 DIAGNOSIS — Z9841 Cataract extraction status, right eye: Secondary | ICD-10-CM | POA: Diagnosis not present

## 2020-09-12 DIAGNOSIS — Z90710 Acquired absence of both cervix and uterus: Secondary | ICD-10-CM | POA: Diagnosis not present

## 2020-09-12 DIAGNOSIS — Z9109 Other allergy status, other than to drugs and biological substances: Secondary | ICD-10-CM | POA: Diagnosis not present

## 2020-09-12 DIAGNOSIS — Z86711 Personal history of pulmonary embolism: Secondary | ICD-10-CM | POA: Diagnosis not present

## 2020-09-12 DIAGNOSIS — Z91041 Radiographic dye allergy status: Secondary | ICD-10-CM | POA: Diagnosis not present

## 2020-09-12 DIAGNOSIS — Z91040 Latex allergy status: Secondary | ICD-10-CM | POA: Diagnosis not present

## 2020-09-12 DIAGNOSIS — N189 Chronic kidney disease, unspecified: Secondary | ICD-10-CM | POA: Diagnosis present

## 2020-09-12 DIAGNOSIS — J84112 Idiopathic pulmonary fibrosis: Secondary | ICD-10-CM | POA: Diagnosis present

## 2020-09-12 DIAGNOSIS — I2510 Atherosclerotic heart disease of native coronary artery without angina pectoris: Secondary | ICD-10-CM | POA: Diagnosis present

## 2020-09-12 DIAGNOSIS — Z888 Allergy status to other drugs, medicaments and biological substances status: Secondary | ICD-10-CM | POA: Diagnosis not present

## 2020-09-12 DIAGNOSIS — M4802 Spinal stenosis, cervical region: Secondary | ICD-10-CM | POA: Diagnosis present

## 2020-09-12 DIAGNOSIS — I509 Heart failure, unspecified: Secondary | ICD-10-CM | POA: Diagnosis present

## 2020-09-12 LAB — GLUCOSE, CAPILLARY
Glucose-Capillary: 128 mg/dL — ABNORMAL HIGH (ref 70–99)
Glucose-Capillary: 154 mg/dL — ABNORMAL HIGH (ref 70–99)
Glucose-Capillary: 127 mg/dL — ABNORMAL HIGH (ref 70–99)
Glucose-Capillary: 107 mg/dL — ABNORMAL HIGH (ref 70–99)

## 2020-09-12 NOTE — Progress Notes (Signed)
Subjective: Patient reports some throat pain and neck pain.  Right arm pain significantly improved from preoperatively  Objective: Vital signs in last 24 hours: Temp:  [97.7 F (36.5 C)-98.3 F (36.8 C)] 98 F (36.7 C) (06/30 0635) Pulse Rate:  [60-92] 60 (06/30 0635) Resp:  [12-20] 16 (06/30 0635) BP: (134-168)/(56-73) 154/73 (06/30 0635) SpO2:  [95 %-100 %] 100 % (06/30 0635)  Intake/Output from previous day: 06/29 0701 - 06/30 0700 In: 1500 [I.V.:1500] Out: 100 [Blood:100] Intake/Output this shift: Total I/O In: 120 [P.O.:120] Out: -   Awake, alert, Ox3 Breathing comfortably C- collar in place Mild dysphonia 4+/5 strength in RUE Dressing intact  Lab Results: No results for input(s): WBC, HGB, HCT, PLT in the last 72 hours. BMET No results for input(s): NA, K, CL, CO2, GLUCOSE, BUN, CREATININE, CALCIUM in the last 72 hours.  Studies/Results: DG Cervical Spine 2 or 3 views  Result Date: 09/11/2020 CLINICAL DATA:  Provided history: Surgery, elective. Additional history provided: ACDF C4-5, C5-6. Provided fluoroscopy time 23 seconds (1.91 mGy). EXAM: CERVICAL SPINE - 2-3 VIEW; DG C-ARM 1-60 MIN COMPARISON:  Cervical spine MRI 08/06/2020. FINDINGS: PA and lateral view intraoperative fluoroscopic images of the cervical spine are submitted, 2 images total. ACDF hardware (ventral plate and screws, as well as interbody devices) present at the C4-C6 levels. Partially visualized ET tube. An esophageal temperature probe projects in the region of the oropharynx. IMPRESSION: Two intraoperative fluoroscopic images of the cervical spine from C4-C6 ACDF, as described. Electronically Signed   By: Kellie Simmering DO   On: 09/11/2020 16:53   DG C-Arm 1-60 Min  Result Date: 09/11/2020 CLINICAL DATA:  Provided history: Surgery, elective. Additional history provided: ACDF C4-5, C5-6. Provided fluoroscopy time 23 seconds (1.91 mGy). EXAM: CERVICAL SPINE - 2-3 VIEW; DG C-ARM 1-60 MIN COMPARISON:   Cervical spine MRI 08/06/2020. FINDINGS: PA and lateral view intraoperative fluoroscopic images of the cervical spine are submitted, 2 images total. ACDF hardware (ventral plate and screws, as well as interbody devices) present at the C4-C6 levels. Partially visualized ET tube. An esophageal temperature probe projects in the region of the oropharynx. IMPRESSION: Two intraoperative fluoroscopic images of the cervical spine from C4-C6 ACDF, as described. Electronically Signed   By: Kellie Simmering DO   On: 09/11/2020 16:53    Assessment/Plan: S/p C4-5, C5-6 ACDF - will plan for D/C tomorrow given her neck pain is not yet fully controlled, made difficulty by her chronic Vicodin use preoperatively. - PT/OT for mobilization - continue C-collar   LOS: 0 days     Miranda [name] 09/12/2020, 11:08 AM

## 2020-09-12 NOTE — Anesthesia Postprocedure Evaluation (Signed)
Anesthesia Post Note  Patient: Miranda [name]  Procedure(s) Performed: Anterior Cervical Decompression Fusion Cervical four-five, Cervical five-six (Neck)     Patient location during evaluation: PACU Anesthesia Type: General Level of consciousness: awake and alert Pain management: pain level controlled Vital Signs Assessment: post-procedure vital signs reviewed and stable Respiratory status: spontaneous breathing, nonlabored ventilation, respiratory function stable and patient connected to nasal cannula oxygen Cardiovascular status: blood pressure returned to baseline and stable Postop Assessment: no apparent nausea or vomiting Anesthetic complications: no   No notable events documented.  Last Vitals:  Vitals:   09/12/20 0214 09/12/20 0635  BP: (!) 145/66 (!) 154/73  Pulse: 70 60  Resp: 16 16  Temp: 36.8 C 36.7 C  SpO2: 100% 100%    Last Pain:  Vitals:   09/12/20 0645  TempSrc:   PainSc: 9                  Margrete Delude

## 2020-09-12 NOTE — Evaluation (Signed)
Physical Therapy Evaluation Patient Details Name: Miranda [name] MRN: [id number] DOB: July 07, 1945 Today's Date: 09/12/2020   History of Present Illness  Pt is a 75 year old woman admitted on 09/11/20 for ACDF C4-5, C 5-6.PMH: DM, HTN, CHF, afib, asthma, idiopathic pulmonary fibrosis, sleep apnea, coronary artery calcification, CKD, PE, arthritis, anxiety and depression.  Clinical Impression  Patient presents with decreased mobility due to spinal precautions, decreased cervical ROM, decreased UE strength and she will benefit from skilled PT in the acute setting prior to d/c home with family support.  Likely not to need follow up PT at d/c.     Follow Up Recommendations Supervision for mobility/OOB;No PT follow up    Equipment Recommendations  None recommended by PT    Recommendations for Other Services       Precautions / Restrictions Precautions Precautions: Cervical;Fall Required Braces or Orthoses: Cervical Brace Cervical Brace: Hard collar;Other (comment) (on OOB)      Mobility  Bed Mobility Overal bed mobility: Needs Assistance Bed Mobility: Sit to Sidelying         Sit to sidelying: Min guard General bed mobility comments: cues for technique, used rail, increased time needed, rolled back with time, pulls up on rails to scoot to [place] Overall transfer level: Needs assistance Equipment used: Rolling walker (2 wheeled) Transfers: Sit to/from Stand Sit to Stand: Min guard         General transfer comment: cues for hand placement  Ambulation/Gait Ambulation/Gait assistance: Min guard;Supervision Gait Distance (Feet): 180 Feet Assistive device: Rolling walker (2 wheeled) Gait Pattern/deviations: Step-through pattern;Decreased stride length     General Gait Details: appropriate technique turning walker, reinforced with cues  Stairs            Wheelchair Mobility    Modified Rankin (Stroke Patients Only)       Balance Overall balance  assessment: Needs assistance   Sitting balance-Leahy Scale: Good       Standing balance-Leahy Scale: Fair Standing balance comment: could stand statically without UE support, feels more secure with RW                             Pertinent Vitals/Pain Faces Pain Scale: Hurts even more Pain Location: neck R side Pain Descriptors / Indicators: Aching;Sore Pain Intervention(s): Monitored during session;Repositioned;Patient requesting pain meds-RN notified    Home Living Family/patient expects to be discharged to:: Private residence Living Arrangements: Alone Available Help at Discharge: Family;Available 24 hours/day Type of Home: House Home Access: Stairs to enter Entrance Stairs-Rails: Right Entrance Stairs-Number of Steps: 3 Home Layout: One level Home Equipment: Cane - single point;Walker - 2 wheels;Walker - 4 wheels      Prior Function Level of Independence: Independent with assistive device(s)         Comments: walking with cane PTA     Hand Dominance        Extremity/Trunk Assessment   Upper Extremity Assessment Upper Extremity Assessment: Defer to OT evaluation    Lower Extremity Assessment Lower Extremity Assessment: Overall WFL for tasks assessed    Cervical / Trunk Assessment Cervical / Trunk Assessment: Normal  Communication   Communication: No difficulties  Cognition Arousal/Alertness: Awake/alert Behavior During Therapy: WFL for tasks assessed/performed Overall Cognitive Status: Within Functional Limits for tasks assessed  General Comments General comments (skin integrity, edema, etc.): completed toileting with set up on [facility]; wanted to leave brace on in supine, encouraged rest breaks when in bed to avoid skin breakdown    Exercises     Assessment/Plan    PT Assessment Patient needs continued PT services  PT Problem List Decreased safety awareness;Decreased knowledge of use  of DME;Pain;Decreased activity tolerance;Decreased knowledge of precautions;Decreased mobility       PT Treatment Interventions DME instruction;Therapeutic activities;Patient/family education;Gait training;Stair training;Functional mobility training    PT Goals (Current goals can be found in the Care Plan section)  Acute Rehab PT Goals Patient Stated Goal: to be more active with her family PT Goal Formulation: With patient Time For Goal Achievement: 09/19/20 Potential to Achieve Goals: Good    Frequency Min 5X/week   Barriers to discharge        Co-evaluation               AM-PAC PT "6 Clicks" Mobility  Outcome Measure Help needed turning from your back to your side while in a flat bed without using bedrails?: A Little Help needed moving from lying on your back to sitting on the side of a flat bed without using bedrails?: A Little Help needed moving to and from a bed to a chair (including a wheelchair)?: A Little Help needed standing up from a chair using your arms (e.g., wheelchair or bedside chair)?: A Little Help needed to walk in hospital room?: A Little Help needed climbing 3-5 steps with a railing? : A Little 6 Click Score: 18    End of Session Equipment Utilized During Treatment: Cervical collar Activity Tolerance: Patient tolerated treatment well Patient left: in bed;with call bell/phone within reach;with family/visitor present Nurse Communication: Patient requests pain meds PT Visit Diagnosis: Other abnormalities of gait and mobility (R26.89);Pain Pain - Right/Left: Right Pain - part of body: Arm    Time: 1255-1316 PT Time Calculation (min) (ACUTE ONLY): 21 min   Charges:   PT Evaluation $PT Eval Low Complexity: 1 Low          Magda Kiel, PT Acute Rehabilitation Services WTUUE:[phone] Office:[phone] 09/12/2020   Reginia Naas 09/12/2020, 1:39 PM

## 2020-09-12 NOTE — Evaluation (Signed)
Occupational Therapy Evaluation Patient Details Name: Miranda [name] MRN: [id number] DOB: Jan 20, 1946 Today's Date: 09/12/2020    History of Present Illness Pt is a 75 year old woman admitted on 09/11/20 for ACDF C4-5, C 5-6.PMH: DM, HTN, CHF, afib, asthma, idiopathic pulmonary fibrosis, sleep apnea, coronary artery calcification, CKD, PE, arthritis, anxiety and depression.   Clinical Impression   Pt was ambulating with a RW, driving and independent in ADL prior to admission. She lives alone, but her daughters will stay with her when she goes home. Pt presents with generalized weakness and pain. She requires min guard assist for all mobility. Educated pt in cervical precautions related to ADL and IADL and reinforced with written handout. Pt likely to progress well. Will follow acutely.     Follow Up Recommendations  No OT follow up    Equipment Recommendations  3 in 1 bedside commode    Recommendations for Other Services       Precautions / Restrictions Precautions Precautions: Cervical;Fall Precaution Booklet Issued: Yes (comment) Required Braces or Orthoses: Cervical Brace (may remove to shower) Cervical Brace: Hard collar      Mobility Bed Mobility Overal bed mobility: Needs Assistance Bed Mobility: Rolling;Sidelying to Sit Rolling: Min guard Sidelying to sit: Min guard       General bed mobility comments: HOB up, use of rail    Transfers Overall transfer level: Needs assistance Equipment used: Rolling walker (2 wheeled) Transfers: Sit to/from Stand Sit to Stand: Min guard         General transfer comment: cues for hand placement    Balance Overall balance assessment: Needs assistance   Sitting balance-Leahy Scale: Good       Standing balance-Leahy Scale: Poor                             ADL either performed or assessed with clinical judgement   ADL Overall ADL's : Needs assistance/impaired Eating/Feeding: Independent   Grooming:  Standing;Min guard   Upper Body Bathing: Minimal assistance;Sitting   Lower Body Bathing: Minimal assistance;Sit to/from stand   Upper Body Dressing : Set up;Sitting   Lower Body Dressing: Minimal assistance;Sit to/from stand   Toilet Transfer: Min guard;Ambulation;BSC;RW   Toileting- Water quality scientist and Hygiene: Min guard;Sit to/from stand       Functional mobility during ADLs: Min guard;Rolling walker       Vision Patient Visual Report: No change from baseline       Perception     Praxis      Pertinent Vitals/Pain Pain Assessment: Faces Faces Pain Scale: Hurts a little bit Pain Location: throat Pain Descriptors / Indicators: Sore Pain Intervention(s): Monitored during session;Repositioned     Hand Dominance Right   Extremity/Trunk Assessment Upper Extremity Assessment Upper Extremity Assessment: Generalized weakness;Overall [facility] for tasks assessed   Lower Extremity Assessment Lower Extremity Assessment: Defer to PT evaluation   Cervical / Trunk Assessment Cervical / Trunk Assessment: Normal   Communication Communication Communication: No difficulties   Cognition Arousal/Alertness: Awake/alert Behavior During Therapy: WFL for tasks assessed/performed Overall Cognitive Status: Within Functional Limits for tasks assessed                                     General Comments       Exercises     Shoulder Instructions      Home Living Family/patient expects  to be discharged to:: Private residence Living Arrangements: Alone Available Help at Discharge: Family;Available 24 hours/day Type of Home: House Home Access: Stairs to enter [employer] of Steps: 3 Entrance Stairs-Rails: Right Home Layout: One level     Bathroom Shower/Tub: Teacher, early years/pre: Standard     Home Equipment: Cane - single point;Walker - 2 wheels;Walker - 4 wheels          Prior Functioning/Environment Level of Independence:  Independent with assistive device(s)        Comments: walking with either walker as needed        OT Problem List: Impaired balance (sitting and/or standing);Decreased knowledge of use of DME or AE;Decreased knowledge of precautions;Pain      OT Treatment/Interventions: Self-care/ADL training;DME and/or AE instruction;Patient/family education;Balance training;Therapeutic activities    OT Goals(Current goals can be found in the care plan section) Acute Rehab OT Goals Patient Stated Goal: to be more active with her family OT Goal Formulation: With patient Time For Goal Achievement: 09/26/20 Potential to Achieve Goals: Good  OT Frequency: Min 2X/week   Barriers to D/C:            Co-evaluation              AM-PAC OT "6 Clicks" Daily Activity     Outcome Measure Help from another person eating meals?: None Help from another person taking care of personal grooming?: A Little Help from another person toileting, which includes using toliet, bedpan, or urinal?: A Little Help from another person bathing (including washing, rinsing, drying)?: A Little Help from another person to put on and taking off regular upper body clothing?: A Little Help from another person to put on and taking off regular lower body clothing?: A Little 6 Click Score: 19   End of Session Equipment Utilized During Treatment: Rolling walker;Gait belt;Cervical collar  Activity Tolerance: Patient tolerated treatment well Patient left: in chair;with call bell/phone within reach;with chair alarm set  OT Visit Diagnosis: Unsteadiness on feet (R26.81);Other abnormalities of gait and mobility (R26.89);Pain                Time: 3790-2409 OT Time Calculation (min): 33 min Charges:  OT General Charges $OT Visit: 1 Visit OT Evaluation $OT Eval Moderate Complexity: 1 Mod OT Treatments $Self Care/Home Management : 8-22 mins Nestor Lewandowsky, OTR/L Acute Rehabilitation Services Pager: [phone] Office:  [phone]   Malka So 09/12/2020, 12:22 PM

## 2020-09-12 NOTE — TOC Progression Note (Signed)
Transition of Care [facility]) - Progression Note    Patient Details  Name: Miranda [name] MRN: [id number] Date of Birth: 1945-09-05  Transition of [facility]) CM/SW Contact  Milinda Antis, [place] Phone Number: 09/12/2020, 8:43 AM  Clinical Narrative:    Patient had surgery today (right arm radiculopathy).  TOC following patient for any d/c planning needs once medically stable.  Lind Covert, MSW, LCSWA         Expected Discharge Plan and Services                                                 Social Determinants of Health (SDOH) Interventions    Readmission Risk Interventions No flowsheet data found.

## 2020-09-13 LAB — GLUCOSE, CAPILLARY: Glucose-Capillary: 88 mg/dL (ref 70–99)

## 2020-09-13 MED ORDER — DOCUSATE SODIUM 100 MG PO CAPS: 100.0000 mg | ORAL_CAPSULE | Freq: Two times a day (BID) | ORAL | 2 refills | Status: DC

## 2020-09-13 MED ORDER — HYDROCODONE-ACETAMINOPHEN 5-325 MG PO TABS: 1.0000 | ORAL_TABLET | Freq: Four times a day (QID) | ORAL | 0 refills | Status: DC | PRN

## 2020-09-13 MED ORDER — METHYLPREDNISOLONE 4 MG PO TBPK: ORAL_TABLET | ORAL | 0 refills | Status: DC

## 2020-09-13 NOTE — Progress Notes (Signed)
Occupational Therapy Treatment Patient Details Name: Miranda [name] MRN: [id number] DOB: 1945-05-31 Today's Date: 09/13/2020    History of present illness Pt is a 75 year old woman admitted on 09/11/20 for ACDF C4-5, C 5-6.PMH: DM, HTN, CHF, afib, asthma, idiopathic pulmonary fibrosis, sleep apnea, coronary artery calcification, CKD, PE, arthritis, anxiety and depression.   OT comments  Pt is eager to go home. Reinforced cervical precautions during ADL and IADL, for which she needs to ask for assistance from her family. Pt completed grooming and dressing with supervision. She verbalized and/or demonstrated understanding of precautions.   Follow Up Recommendations  No OT follow up    Equipment Recommendations  3 in 1 bedside commode    Recommendations for Other Services      Precautions / Restrictions Precautions Precautions: Cervical;Fall Precaution Comments: reinforced cervical precautions and compensatory strategies Required Braces or Orthoses: Cervical Brace Cervical Brace: Hard collar;Other (comment) (may remove to shower)       Mobility Bed Mobility               General bed mobility comments: up in chair    Transfers Overall transfer level: Needs assistance Equipment used: Rolling walker (2 wheeled) Transfers: Sit to/from Stand Sit to Stand: Supervision              Balance Overall balance assessment: Needs assistance   Sitting balance-Leahy Scale: Good     Standing balance support: No upper extremity supported Standing balance-Leahy Scale: Fair Standing balance comment: at sink                           ADL either performed or assessed with clinical judgement   ADL Overall ADL's : Needs assistance/impaired Eating/Feeding: Independent Eating/Feeding Details (indicate cue type and reason): opening containers without difficulty Grooming: Oral care;Standing Grooming Details (indicate cue type and reason): using 2 cup method   Upper  Body Bathing Details (indicate cue type and reason): pt may consider a long handled bath sponge for back Lower Body Bathing: Supervison/ safety;Sit to/from stand   Upper Body Dressing : Set up;Sitting   Lower Body Dressing: Set up;Sitting/lateral leans   Toilet Transfer: Supervision/safety;Ambulation;RW   Toileting- Clothing Manipulation and Hygiene: Supervision/safety;Sit to/from stand       Functional mobility during ADLs: Supervision/safety;Rolling walker General ADL Comments: Reminded pt of IADL to avoid.     Vision       Perception     Praxis      Cognition Arousal/Alertness: Awake/alert Behavior During Therapy: WFL for tasks assessed/performed Overall Cognitive Status: Within Functional Limits for tasks assessed                                          Exercises     Shoulder Instructions       General Comments      Pertinent Vitals/ Pain       Pain Assessment: Faces Faces Pain Scale: Hurts a little bit Pain Location: throat Pain Descriptors / Indicators: Sore Pain Intervention(s): Monitored during session;Premedicated before session  Home Living                                          Prior Functioning/Environment  Frequency  Min 2X/week        Progress Toward Goals  OT Goals(current goals can now be found in the care plan section)  Progress towards OT goals: Progressing toward goals  Acute Rehab OT Goals Patient Stated Goal: to be more active with her family Time For Goal Achievement: 09/26/20 Potential to Achieve Goals: Good  Plan Discharge plan remains appropriate    Co-evaluation                 AM-PAC OT "6 Clicks" Daily Activity     Outcome Measure   Help from another person eating meals?: None Help from another person taking care of personal grooming?: A Little Help from another person toileting, which includes using toliet, bedpan, or urinal?: A Little Help from  another person bathing (including washing, rinsing, drying)?: A Little Help from another person to put on and taking off regular upper body clothing?: None Help from another person to put on and taking off regular lower body clothing?: A Little 6 Click Score: 20    End of Session Equipment Utilized During Treatment: Gait belt;Rolling walker;Cervical collar  OT Visit Diagnosis: Unsteadiness on feet (R26.81);Other abnormalities of gait and mobility (R26.89);Pain   Activity Tolerance Patient tolerated treatment well   Patient Left in chair;with call bell/phone within reach   Nurse Communication          Time: 0352-4818 OT Time Calculation (min): 18 min  Charges: OT General Charges $OT Visit: 1 Visit OT Treatments $Self Care/Home Management : 8-22 mins  Nestor Lewandowsky, OTR/L Acute Rehabilitation Services Pager: [phone] Office: [phone]    Malka So 09/13/2020, 9:56 AM

## 2020-09-13 NOTE — Progress Notes (Signed)
Called the office of Dr. Marcello Moores regarding Xarelto, per Instruction patient may resume Xarelto by July 5, instruction was written in patient discharge paper.

## 2020-09-13 NOTE — Plan of Care (Signed)
  Problem: Education: Goal: Knowledge of General Education information will improve Description: Including pain rating scale, medication(s)/side effects and non-pharmacologic comfort measures Outcome: Adequate for Discharge   

## 2020-09-13 NOTE — Discharge Summary (Signed)
Physician Discharge Summary  Patient ID: Miranda [name] MRN: [id number] DOB/AGE: 1945/08/04 75 y.o.  Admit date: 09/11/2020 Discharge date: 09/13/2020  Admission Diagnoses:  Cervical radiculopathy  Discharge Diagnoses:  Same Active Problems:   Cervical radiculopathy   Discharged Condition: Stable  Hospital Course:  Miranda [name] is a 75 y.o. female who underwent elective C4-5 and C5-6 ACDFs.  She was admitted postoperatively and was mobilized with PT/OT and pain was controlled with PO pain medication.  She was tolerating a modified diet.  Her preoperative right arm radiculopathy symptoms were significantly improved.  She was deemed ready for discharge on 09/13/20.  Treatments: Surgery - C4-5, C5-6 ACDF  Discharge Exam: Blood pressure (!) 147/58, pulse 62, temperature 98.3 F (36.8 C), temperature source Oral, resp. rate 16, height 5\' 5"  (1.651 m), weight 80.3 kg, SpO2 98 %. Awake, alert, oriented NAD Speech fluent, appropriate.  + Dysphonia C-collar in place CN grossly intact 5/5 BUE/BLE, except 4+/5 R biceps and handgrip Wound c/d/i  Disposition: Discharge disposition: 01-Home or Self Care       Discharge Instructions     Incentive spirometry RT   Complete by: As directed       Allergies as of 09/13/2020       Reactions   Fish Oil Anaphylaxis   Other Hives, Shortness Of Breath, Swelling   Allergic to cashew nuts and peanut oil.   Penicillins Anaphylaxis, Hives, Swelling   Has patient had a PCN reaction causing immediate rash, facial/tongue/throat swelling, SOB or lightheadedness with hypotension: Face swelling and hives started first, then swelling of the throat  Has patient had a PCN reaction causing severe rash involving mucus membranes or skin necrosis: Yes  Has patient had a PCN reaction that required hospitalization: No  Has patient had a PCN reaction occurring within the last 10 years: Yes  If all of the above answers are "NO", then may proceed with  Cephalospor   Pneumococcal Vaccines Nausea And Vomiting   Cashew Nut Oil    Nitrofurantoin Macrocrystal Hives   Peanut Oil    Aspirin Itching, Rash   Bactrim [sulfamethoxazole-trimethoprim] Hives, Itching, Rash   Ciprofloxacin Hives, Itching, Rash   Ibuprofen Rash   Influenza Vaccines Hives   Ivp Dye [iodinated Diagnostic Agents] Hives, Itching, Rash   Gives benadryl to counteract symptoms   Latex Rash   Macrobid [nitrofurantoin Monohydrate Macrocrystals] Hives   Shellfish Allergy Hives   Patient also allergic to seafood        Medication List     STOP taking these medications    rivaroxaban 20 MG Tabs tablet Commonly known as: Xarelto       TAKE these medications    Accu-Chek Aviva Plus test strip Generic drug: glucose blood Use once daily for glucose control.  Dx E11.9  For accu-chek aviva   Accu-Chek Guide test strip Generic drug: glucose blood Use as instructed   Accu-Chek Softclix Lancets lancets Use as instructed   acetaminophen 325 MG tablet Commonly known as: TYLENOL Take 650 mg by mouth every 6 (six) hours as needed for moderate pain.   ALPRAZolam 0.25 MG tablet Commonly known as: XANAX Take 1 tablet (0.25 mg total) by mouth 3 (three) times daily as needed. for anxiety   amLODipine 10 MG tablet Commonly known as: NORVASC Take 1 tablet (10 mg total) by mouth daily.   atorvastatin 80 MG tablet Commonly known as: LIPITOR Take 1 tablet (80 mg total) by mouth daily.   docusate sodium 100 MG  capsule Commonly known as: COLACE Take 1 capsule (100 mg total) by mouth 2 (two) times daily.   ezetimibe 10 MG tablet Commonly known as: Zetia Take 1 tablet (10 mg total) by mouth daily.   FreeStyle Libre 14 Day Reader Kerrin Mo 1 each by Does not apply route daily.   FreeStyle Libre 14 Day Sensor Misc USE 1  ONCE DAILY   hydrochlorothiazide 25 MG tablet Commonly known as: HYDRODIURIL Take 1 tablet (25 mg total) by mouth daily.    HYDROcodone-acetaminophen 5-325 MG tablet Commonly known as: NORCO/VICODIN Take 1-2 tablets by mouth every 6 (six) hours as needed for severe pain or moderate pain ((score 7 to 10)). What changed:  how much to take reasons to take this Another medication with the same name was removed. Continue taking this medication, and follow the directions you see here.   lidocaine 5 % Commonly known as: Lidoderm Place 1 patch onto the skin daily. Remove & Discard patch within 12 hours or as directed by MD   linaclotide 72 MCG capsule Commonly known as: LINZESS Take 72 mcg by mouth daily before breakfast.   losartan 100 MG tablet Commonly known as: COZAAR Take 1 tablet (100 mg total) by mouth daily.   metFORMIN 500 MG 24 hr tablet Commonly known as: GLUCOPHAGE-XR Take 1 tablet (500 mg total) by mouth daily with breakfast.   methylPREDNISolone 4 MG Tbpk tablet Commonly known as: MEDROL DOSEPAK Take as instructed on packet   Plenvu 140 g Solr Generic drug: PEG-KCl-NaCl-NaSulf-Na Asc-C Take 140 g by mouth as directed. Manufacturer's coupon Universal coupon code:BIN: P2366821; GROUP: [id number]; PCN: [id number]; ID: [id number]; PAY NO MORE $50   polyethylene glycol powder 17 GM/SCOOP powder Commonly known as: GLYCOLAX/MIRALAX Take 17 g by mouth daily.   senna 8.6 MG Tabs tablet Commonly known as: SENOKOT Take 1 tablet by mouth daily as needed for mild constipation.       ASK your doctor about these medications    albuterol 108 (90 Base) MCG/ACT inhaler Commonly known as: VENTOLIN HFA Inhale 1 puff into the lungs every 6 (six) hours as needed.   cyclobenzaprine 5 MG tablet Commonly known as: FLEXERIL TAKE ONE TABLET BY MOUTH EVERYDAY AT BEDTIME   diclofenac sodium 1 % Gel Commonly known as: Voltaren Apply 2 g topically 4 (four) times daily.   Esbriet 801 MG Tabs Generic drug: Pirfenidone Take 1 tablet by mouth 3 (three) times daily.   furosemide 40 MG tablet Commonly known as:  LASIX Take 1/2 (one-half) tablet by mouth once daily   lactulose 10 GM/15ML solution Commonly known as: Constulose TAKE 30 MILLILITERS BY MOUTH  THREE TIMES DAILY AS NEEDED FOR MILD CONSTIPATION   Multaq 400 MG tablet Generic drug: dronedarone TAKE 1 TABLET BY MOUTH TWICE DAILY WITH A MEAL        Follow-up Information     [name] Mare, MD Follow up in 2 week(s).   Specialty: Neurosurgery Contact information: [address] [place] [address] [phone]                 Signed: [name] Mare 09/13/2020, 8:21 AM

## 2020-09-13 NOTE — Discharge Instructions (Addendum)
Can remove transparent dressing and shower 48 hours after surgery.  Underlying steri-strips to remain on. Walk as much as possible No heavy lifting >10 lbs Continue to wear C-collar at all times Avoid chunky foods Resume Xarelto on 09/17/20

## 2020-09-13 NOTE — Progress Notes (Signed)
Discharge instructions (including medications) discussed with and copy provided to patient/caregiver 

## 2020-09-13 NOTE — TOC Transition Note (Addendum)
Transition of Care [facility]) - CM/SW Discharge Note   Patient Details  Name: Miranda [name] MRN: [id number] Date of Birth: 1945/06/12  Transition of Care [facility]) CM/SW Contact:  Sharin Mons, RN Phone Number: 09/13/2020, 9:43 AM   Clinical Narrative:         - Sp/ ACDF C4-5, C 5-6, 6/29  PMH: DM, HTN, CHF, afib, asthma, idiopathic pulmonary fibrosis, sleep apnea, coronary artery calcification, CKD, PE, arthritis, anxiety and depression. From home with husband/ family. PTA independent with ADL'S , no dme usage.  Patient will DC to: Home Anticipated DC date: 09/13/2020 Family notified: yes Transport by: car  Per MD patient ready for DC today .RN, patient, patient's family and [facility] notified of DC. DME : rolling walker and 3 in 1/ bsc will be delivered to bedside prior to d/c. Pt  without  Rx MED concerns.  Post hospital f/u noted on AVS.  RNCM will sign off for now as intervention is no longer needed. Please consult [place] again if new needs arise.   Final next level of care: Home/Self Care Barriers to Discharge: No Barriers Identified   Patient Goals and CMS Choice     Choice offered to / list presented to : Patient  Discharge Placement                       Discharge Plan and Services                DME Arranged: 3-N-1, Walker rolling DME Agency: [employer] Date DME Agency Contacted: 09/13/20 Time DME Agency Contacted: [phone] Representative spoke with at DME Agency: [place] Interventions     Readmission Risk Interventions No flowsheet data found.

## 2020-09-16 ENCOUNTER — Other Ambulatory Visit: Payer: Self-pay | Admitting: Internal Medicine

## 2020-09-16 DIAGNOSIS — M545 Low back pain, unspecified: Secondary | ICD-10-CM

## 2020-09-16 DIAGNOSIS — M25512 Pain in left shoulder: Secondary | ICD-10-CM

## 2020-09-16 DIAGNOSIS — I10 Essential (primary) hypertension: Secondary | ICD-10-CM

## 2020-09-16 DIAGNOSIS — G8929 Other chronic pain: Secondary | ICD-10-CM

## 2020-09-16 DIAGNOSIS — E785 Hyperlipidemia, unspecified: Secondary | ICD-10-CM

## 2020-09-17 ENCOUNTER — Telehealth: Payer: Self-pay | Admitting: Pharmacist

## 2020-09-17 MED ORDER — FREESTYLE LIBRE 14 DAY SENSOR MISC
5 refills | Status: DC
Start: 2020-09-17 — End: 2020-10-18

## 2020-09-17 NOTE — Telephone Encounter (Signed)
Refill sent.

## 2020-09-17 NOTE — Telephone Encounter (Signed)
-----   Message from [name], [facility] sent at 09/17/2020 10:49 AM EDT ----- Regarding: Elenor Legato sensor refill Hi,  Ms. Momon is almost completely out of freestyle libre sensors. Can you please send in a refill to [name] pharmacy for her?  Thank you!! Maddie

## 2020-09-17 NOTE — Chronic Care Management (AMB) (Signed)
Chronic Care Management Pharmacy Assistant   Name: Miranda [name]  MRN: [id number] DOB: Feb 12, 1946  Reason for Encounter: Medication Review- Acute medication refill   Recent office visits:  None  Recent consult visits:  None  Hospital visits:  Medication Reconciliation was completed by comparing discharge summary, patient's EMR and Pharmacy list, and upon discussion with patient.  Admitted to the hospital on 06.29.2022 due to Cervical Radiculopathy. Discharge date was 07.01.2022. Discharged from [facility]?Medications Started at [facility] Discharge:?? -started the following medication due to Cervical Radiculopathy Docusate sodium (COLACE) Methylprednisolone (MEDROL DOSEPAK)  Medication Changes at Hospital Discharge: -Changed the following medication Hydrocodone-acetaminophen (NORCO/VICODIN): take 1-2 tablets by mouth every 6 hours as needed for severe pain  Medications Discontinued at Hospital Discharge: -Stopped the following medication Rivaroxaban 20 MG Tabs tablet (Xarelto)  Medications that remain the same after Hospital Discharge:??  -All other medications will remain the same.    Medications: Outpatient Encounter Medications as of 09/17/2020  Medication Sig   Accu-Chek Softclix Lancets lancets Use as instructed   acetaminophen (TYLENOL) 325 MG tablet Take 650 mg by mouth every 6 (six) hours as needed for moderate pain.   albuterol (VENTOLIN HFA) 108 (90 Base) MCG/ACT inhaler Inhale 1 puff into the lungs every 6 (six) hours as needed. (Patient taking differently: Inhale 1 puff into the lungs every 6 (six) hours as needed for shortness of breath or wheezing.)   ALPRAZolam (XANAX) 0.25 MG tablet Take 1 tablet (0.25 mg total) by mouth 3 (three) times daily as needed. for anxiety   amLODipine (NORVASC) 10 MG tablet Take 1 tablet (10 mg total) by mouth daily.   atorvastatin (LIPITOR) 80 MG tablet Take 1 tablet (80 mg total) by mouth daily.    Continuous Blood Gluc Receiver (FREESTYLE LIBRE 14 DAY READER) DEVI 1 each by Does not apply route daily.   Continuous Blood Gluc Sensor (FREESTYLE LIBRE 14 DAY SENSOR) MISC USE 1  ONCE DAILY   cyclobenzaprine (FLEXERIL) 5 MG tablet TAKE ONE TABLET BY MOUTH EVERYDAY AT BEDTIME (Patient taking differently: Take 5 mg by mouth at bedtime.)   diclofenac sodium (VOLTAREN) 1 % GEL Apply 2 g topically 4 (four) times daily. (Patient taking differently: Apply 2 g topically daily as needed (pain).)   docusate sodium (COLACE) 100 MG capsule Take 1 capsule (100 mg total) by mouth 2 (two) times daily.   dronedarone (MULTAQ) 400 MG tablet TAKE 1 TABLET BY MOUTH TWICE DAILY WITH A MEAL (Patient taking differently: Take 400 mg by mouth 2 (two) times daily with a meal.)   ezetimibe (ZETIA) 10 MG tablet Take 1 tablet (10 mg total) by mouth daily.   furosemide (LASIX) 40 MG tablet Take 1/2 (one-half) tablet by mouth once daily (Patient taking differently: Take 20 mg by mouth daily.)   glucose blood (ACCU-CHEK AVIVA PLUS) test strip Use once daily for glucose control.  Dx E11.9  For accu-chek aviva   glucose blood (ACCU-CHEK GUIDE) test strip Use as instructed   hydrochlorothiazide (HYDRODIURIL) 25 MG tablet Take 1 tablet (25 mg total) by mouth daily.   HYDROcodone-acetaminophen (NORCO/VICODIN) 5-325 MG tablet Take 1-2 tablets by mouth every 6 (six) hours as needed for severe pain or moderate pain ((score 7 to 10)).   lactulose (CONSTULOSE) 10 GM/15ML solution TAKE 30 MILLILITERS BY MOUTH  THREE TIMES DAILY AS NEEDED FOR MILD CONSTIPATION (Patient taking differently: Take 20 g by mouth 3 (three) times daily as needed for moderate  constipation.)   lidocaine (LIDODERM) 5 % Place 1 patch onto the skin daily. Remove & Discard patch within 12 hours or as directed by MD   linaclotide (LINZESS) 72 MCG capsule Take 72 mcg by mouth daily before breakfast.   losartan (COZAAR) 100 MG tablet Take 1 tablet (100 mg total) by mouth  daily.   metFORMIN (GLUCOPHAGE-XR) 500 MG 24 hr tablet Take 1 tablet (500 mg total) by mouth daily with breakfast.   methylPREDNISolone (MEDROL DOSEPAK) 4 MG TBPK tablet Take as instructed on packet   PEG-KCl-NaCl-NaSulf-Na Asc-C (PLENVU) 140 g SOLR Take 140 g by mouth as directed. Manufacturer's coupon Universal coupon code:BIN: P2366821; GROUP: [id number]; PCN: [id number]; ID: [id number]; PAY NO MORE $50   Pirfenidone (ESBRIET) 801 MG TABS Take 1 tablet by mouth 3 (three) times daily. (Patient taking differently: Take 801 mg by mouth 3 (three) times daily.)   polyethylene glycol powder (GLYCOLAX/MIRALAX) powder Take 17 g by mouth daily.   senna (SENOKOT) 8.6 MG TABS tablet Take 1 tablet by mouth daily as needed for mild constipation.   No facility-administered encounter medications on file as of 09/17/2020.   Date- Patient called to remind of appointment with Watt Climes on  0706.2022 at 11:30 am.  Patient aware of appointment date, time, and type of appointment ( in person). Patient aware to have/bring all medications, supplements, blood pressure and/or blood sugar logs to visit.  Questions: Have you had any recent office visit or specialist visit outside of [facility]? No Are there any concerns you would like to discuss during your office visit? No Are you having any problems obtaining your medications? (Whether it pharmacy issues or cost) No If patient has any PAP medications ask if they are having any problems getting their PAP medication or refill? No  Received call from patient regarding medication management via [name].  Patient requested an acute fill for Continuous Blood Gluc Sensor (FREESTYLE LIBRE 14 DAY SENSOR) to be delivered: 07.05.2022 Pharmacy needs refills? Yes   Star Rating Drug: Medication Dispensed Quantity Pharmacy  Metformin 500 mg   05.18.2022 90 [facility]  Atorvastatin 80 mg 06.13.2022 30 [name]    Any gaps in medications fill history?  No    Maia Breslow, [place] Pharmacist Assistant [phone]

## 2020-09-18 ENCOUNTER — Other Ambulatory Visit: Payer: Self-pay

## 2020-09-18 ENCOUNTER — Other Ambulatory Visit (INDEPENDENT_AMBULATORY_CARE_PROVIDER_SITE_OTHER): Payer: HMO

## 2020-09-18 ENCOUNTER — Ambulatory Visit (INDEPENDENT_AMBULATORY_CARE_PROVIDER_SITE_OTHER): Payer: HMO | Admitting: Pharmacist

## 2020-09-18 VITALS — BP 132/76

## 2020-09-18 DIAGNOSIS — I10 Essential (primary) hypertension: Secondary | ICD-10-CM | POA: Diagnosis not present

## 2020-09-18 DIAGNOSIS — E119 Type 2 diabetes mellitus without complications: Secondary | ICD-10-CM

## 2020-09-18 DIAGNOSIS — E559 Vitamin D deficiency, unspecified: Secondary | ICD-10-CM

## 2020-09-18 DIAGNOSIS — E785 Hyperlipidemia, unspecified: Secondary | ICD-10-CM

## 2020-09-18 LAB — VITAMIN D 25 HYDROXY (VIT D DEFICIENCY, FRACTURES): VITD: 18.01 ng/mL — ABNORMAL LOW (ref 30.00–100.00)

## 2020-09-18 LAB — LIPID PANEL
Cholesterol: 123 mg/dL (ref 0–200)
HDL: 59.8 mg/dL (ref >39.00)
LDL Cholesterol: 53 mg/dL (ref 0–99)
NonHDL: 62.99
Total CHOL/HDL Ratio: 2
Triglycerides: 51 mg/dL (ref 0.0–149.0)
VLDL: 10.2 mg/dL (ref 0.0–40.0)

## 2020-09-18 NOTE — Progress Notes (Signed)
Chronic Care Management Pharmacy Note  09/18/2020 Name:  Miranda [name] MRN:  [id number] DOB:  1946-02-18   Summary: BP not at goal < 140/90 per last few office readings A1c at goal of < 7% LDL not at goal of < 70  Recommendations/Changes made from today's visit: -Recommended routine BP monitoring at home -Recommend stopping metformin based on recent A1c and low BGs per CGM report -Provided PAP paperwork for Linzess in office  Plan: Follow up in office to review CGM data and home BP readings Apply for PAP for Linzess  Subjective: Miranda [name] is an 75 y.o. year old female who is a primary patient of [name], [name], MD.  The CCM team was consulted for assistance with disease management and care coordination needs.    Engaged with patient face to face for follow up visit in response to provider referral for pharmacy case management and/or care coordination services.   Consent to Services:  The patient was given information about Chronic Care Management services, agreed to services, and gave verbal consent prior to initiation of services.  Please see initial visit note for detailed documentation.   Patient Care Team: [name], [name], MD as PCP - General (Internal Medicine) [name], [name], MD as PCP - Cardiology (Cardiology) [name], MD as PCP - Electrophysiology (Cardiology) [name], PA-C (Inactive) (Dermatology) [name], [facility] as Pharmacist (Pharmacist)  Recent office visits: 07/19/20 Domingo Mend, MD: Patient presented for tenesmus and cervical radiculopathy. Recommended taking Miralax daily. Ordered C-spine MRI and referred to GI for colonoscopy.  06/20/20 Domingo Mend, MD: Patient presented for DM follow up. A1c decreased to 5.8%.    05/16/20 Domingo Mend, MD: Patient presented for diabetes follow up. Entered pain contract with hydrocodone 5/325 mg to be taken 1 tablet every 4 hours as needed limited to  90 tabs/month.  04/04/20 Domingo Mend, MD: Patient presented for breast pain. Recommended diagnostic mammogram.  Recent consult visits: 08/29/20 Wilfrid Lund, MD (gastro): Patient presented for chronic constipation. Provided Linzess samples.  06/04/20 Allegra Lai, MD (cardiology): Patient presented for Afib electrophysiology evaluation.  05/09/20 Levy Pupa, PA (emergeortho): Unable to access notes.  05/06/20 Brand Males, MD (pulmonary): Patient presented for IPF research encounter.  04/19/20 [name], MD (cardiology): Patient presented for Afib follow up.    12/20/19 Beatrix Fetters (optometry): Patient presented for diabetic eye exam.  10/23/19 - Pulmonology - Rexene Edison, NP - Patient presented for follow up for OSA and pulmonary fibrosis. Noted 77% compliance with CPAP.  Hospital visits: Medication Reconciliation was completed by comparing discharge summary, patient's EMR and Pharmacy list, and upon discussion with patient.   Admitted to the hospital on 06.29.2022 due to Cervical Radiculopathy. Discharge date was 07.01.2022. Discharged from [facility]?Medications Started at [facility] Discharge:?? -started the following medication due to Cervical Radiculopathy Docusate sodium (COLACE) Methylprednisolone (MEDROL DOSEPAK)   Medication Changes at Hospital Discharge: -Changed the following medication Hydrocodone-acetaminophen (NORCO/VICODIN): take 1-2 tablets by mouth every 6 hours as needed for severe pain   Medications Discontinued at Hospital Discharge: -Stopped the following medication Rivaroxaban 20 MG Tabs tablet (Xarelto)   Medications that remain the same after Hospital Discharge:?? -All other medications will remain the same.    Objective:  Lab Results  Component Value Date   CREATININE 0.93 09/09/2020   BUN 17 09/09/2020   GFR 75.22 10/12/2019   GFRNONAA >60 09/09/2020   GFRAA >60 11/04/2017   NA  137 09/09/2020   K 3.0 (L)  09/09/2020   CALCIUM 9.7 09/09/2020   CO2 28 09/09/2020   GLUCOSE 86 09/09/2020    Lab Results  Component Value Date/Time   HGBA1C 5.8 (A) 06/20/2020 11:16 AM   HGBA1C 6.0 (A) 05/16/2020 11:13 AM   HGBA1C 6.1 12/01/2019 10:25 AM   HGBA1C 6.0 02/02/2018 12:20 PM   GFR 75.22 10/12/2019 10:41 AM   GFR 67.48 11/09/2018 10:20 AM    Last diabetic Eye exam: No results found for: HMDIABEYEEXA  Last diabetic Foot exam: No results found for: HMDIABFOOTEX   Lab Results  Component Value Date   CHOL 171 12/01/2019   HDL 65 12/01/2019   LDLCALC 90 12/01/2019   LDLDIRECT 152.8 03/30/2011   TRIG 70 12/01/2019   CHOLHDL 2.6 12/01/2019    Hepatic Function Latest Ref Rng & Units 12/01/2019 10/12/2019 11/09/2018  Total Protein 6.1 - 8.1 g/dL 8.1 7.8 8.2  Albumin 3.5 - 5.2 g/dL - 4.4 4.6  AST 10 - 35 U/L '20 21 20  ' ALT 6 - 29 U/L '18 20 25  ' Alk Phosphatase 39 - 117 U/L - 41 47  Total Bilirubin 0.2 - 1.2 mg/dL 0.7 0.6 0.5  Bilirubin, Direct 0.0 - 0.3 mg/dL - - -    Lab Results  Component Value Date/Time   TSH 1.45 12/01/2019 10:25 AM   TSH 1.499 09/14/2017 04:58 PM   TSH 0.86 06/01/2014 11:45 AM    CBC Latest Ref Rng & Units 09/09/2020 02/05/2020 12/01/2019  WBC 4.0 - 10.5 K/uL 6.3 5.8 6.3  Hemoglobin 12.0 - 15.0 g/dL 12.8 13.2 12.7  Hematocrit 36.0 - 46.0 % 40.1 41.5 37.5  Platelets 150 - 400 K/uL 204 201 189.0    Lab Results  Component Value Date/Time   VD25OH 14.58 (L) 12/01/2019 10:25 AM    Clinical ASCVD: No  The 10-year ASCVD risk score Mikey Bussing DC Jr., et al., 2013) is: 29%   Values used to calculate the score:     Age: 87 years     Sex: Female     Is Non-Hispanic African American: Yes     Diabetic: Yes     Tobacco smoker: No     Systolic Blood Pressure: 347 mmHg     Is BP treated: Yes     HDL Cholesterol: 65 mg/dL     Total Cholesterol: 171 mg/dL    Depression screen [facility] 2/9 11/29/2019 03/28/2019 01/02/2019  Decreased Interest 0 0 0  Down, Depressed, Hopeless 0 1 0  PHQ  - 2 Score 0 1 0  Altered sleeping 1 - -  Tired, decreased energy 0 - -  Change in appetite 0 - -  Feeling bad or failure about yourself  0 - -  Trouble concentrating 0 - -  Moving slowly or fidgety/restless 0 - -  Suicidal thoughts 0 - -  PHQ-9 Score 1 - -  Some recent data might be hidden     CHA2DS2/VAS Stroke Risk Points  Current as of yesterday     5 >= 2 Points: High Risk  1 - 1.99 Points: Medium Risk  0 Points: Low Risk    Last Change: N/A      Details    This score determines the patient's risk of having a stroke if the  patient has atrial fibrillation.       Points Metrics  0 Has Congestive Heart Failure:  No    Current as of yesterday  1 Has Vascular Disease:  Yes    Current as of yesterday  1 Has Hypertension:  Yes    Current as of yesterday  1 Age:  74    Current as of yesterday  1 Has Diabetes:  Yes    Current as of yesterday  0 Had Stroke:  No  Had TIA:  No  Had Thromboembolism:  No    Current as of yesterday  1 Female:  Yes    Current as of yesterday     Social History   Tobacco Use  Smoking Status Never  Smokeless Tobacco Never   BP Readings from Last 3 Encounters:  09/18/20 132/76  09/13/20 (!) 147/58  09/09/20 (!) 143/65   Pulse Readings from Last 3 Encounters:  09/13/20 62  09/09/20 62  08/29/20 61   Wt Readings from Last 3 Encounters:  09/11/20 177 lb (80.3 kg)  09/09/20 177 lb (80.3 kg)  08/29/20 177 lb (80.3 kg)   BMI Readings from Last 3 Encounters:  09/11/20 29.45 kg/m  09/09/20 29.45 kg/m  08/29/20 28.57 kg/m    Assessment/Interventions: Review of patient past medical history, allergies, medications, health status, including review of consultants reports, laboratory and other test data, was performed as part of comprehensive evaluation and provision of chronic care management services.   SDOH:  (Social Determinants of Health) assessments and interventions performed: No  SDOH Screenings   Alcohol Screen: Not on file   Depression (PHQ2-9): Low Risk    PHQ-2 Score: 1  Financial Resource Strain: Not on file  Food Insecurity: Not on file  Housing: Not on file  Physical Activity: Not on file  Social Connections: Not on file  Stress: Not on file  Tobacco Use: Low Risk    Smoking Tobacco Use: Never   Smokeless Tobacco Use: Never  Transportation Needs: Not on file    CCM Care Plan  Allergies  Allergen Reactions   Fish Oil Anaphylaxis   Other Hives, Shortness Of Breath and Swelling    Allergic to cashew nuts and peanut oil.   Penicillins Anaphylaxis, Hives and Swelling    Has patient had a PCN reaction causing immediate rash, facial/tongue/throat swelling, SOB or lightheadedness with hypotension: Face swelling and hives started first, then swelling of the throat  Has patient had a PCN reaction causing severe rash involving mucus membranes or skin necrosis: Yes  Has patient had a PCN reaction that required hospitalization: No  Has patient had a PCN reaction occurring within the last 10 years: Yes  If all of the above answers are "NO", then may proceed with Cephalospor   Pneumococcal Vaccines Nausea And Vomiting   Cashew Nut Oil    Nitrofurantoin Macrocrystal Hives   Peanut Oil    Aspirin Itching and Rash   Bactrim [Sulfamethoxazole-Trimethoprim] Hives, Itching and Rash   Ciprofloxacin Hives, Itching and Rash   Ibuprofen Rash   Influenza Vaccines Hives   Ivp Dye [Iodinated Diagnostic Agents] Hives, Itching and Rash    Gives benadryl to counteract symptoms   Latex Rash   Macrobid [Nitrofurantoin Monohydrate Macrocrystals] Hives   Shellfish Allergy Hives    Patient also allergic to seafood    Medications Reviewed Today     Reviewed by Nena Polio, RN (Registered Nurse) on 09/11/20 at 1151  Med List Status: Complete   Medication Order Taking? Sig Documenting Provider Last Dose Status Informant  Accu-Chek Softclix Lancets lancets [id number]  Use as instructed [name], [name], MD   Active Self  acetaminophen (TYLENOL) 325 MG  tablet [id number] Yes Take 650 mg by mouth every 6 (six) hours as needed for moderate pain. [provider] Past Week Active Self  albuterol (VENTOLIN HFA) 108 (90 Base) MCG/ACT inhaler [id number] Yes Inhale 1 puff into the lungs every 6 (six) hours as needed.  Patient taking differently: Inhale 1 puff into the lungs every 6 (six) hours as needed for shortness of breath or wheezing.   [name], [name], MD Past Month Active   ALPRAZolam Duanne Moron) 0.25 MG tablet [id number] Yes Take 1 tablet (0.25 mg total) by mouth 3 (three) times daily as needed. for anxiety [name], [name], MD 09/11/2020 0800 Active Self  amLODipine (NORVASC) 10 MG tablet [id number] Yes Take 1 tablet (10 mg total) by mouth daily. [name], [name], MD 09/11/2020 0800 Active Self  atorvastatin (LIPITOR) 80 MG tablet [id number] Yes Take 1 tablet (80 mg total) by mouth daily. [name], [name], MD 09/11/2020 800 Active Self  Continuous Blood Gluc Receiver (FREESTYLE LIBRE 14 DAY READER) DEVI [id number]  1 each by Does not apply route daily. [name], [name], MD  Active Self  Continuous Blood Gluc Sensor (FREESTYLE LIBRE 14 [place]) [place] [id number]  USE 1  ONCE DAILY [name], [name], MD  Active Self  cyclobenzaprine (FLEXERIL) 5 MG tablet [id number] Yes TAKE ONE TABLET BY MOUTH EVERYDAY AT BEDTIME  Patient taking differently: Take 5 mg by mouth at bedtime.   [name], [name], MD 09/10/2020 Active   diclofenac sodium (VOLTAREN) 1 % GEL [id number] Yes Apply 2 g topically 4 (four) times daily.  Patient taking differently: Apply 2 g topically daily as needed (pain).   Caren Griffins, MD Past Month Active            Med Note Louretta Shorten, APRIL   Tue Jun 04, 2020 11:06 AM)    dronedarone (MULTAQ) 400 MG tablet [id number] Yes TAKE 1 TABLET BY MOUTH TWICE DAILY WITH A MEAL  Patient taking differently: Take 400 mg by mouth 2 (two)  times daily with a meal.   [name], MD 09/10/2020 Active   ezetimibe (ZETIA) 10 MG tablet [id number] Yes Take 1 tablet (10 mg total) by mouth daily. [name], [name], MD 09/11/2020 0800 Active Self  furosemide (LASIX) 40 MG tablet [id number] Yes Take 1/2 (one-half) tablet by mouth once daily  Patient taking differently: Take 20 mg by mouth daily.   [name], [name], MD 09/10/2020 Active   glucose blood (ACCU-CHEK AVIVA PLUS) test strip [id number]  Use once daily for glucose control.  Dx E11.9  For accu-chek Allen Norris, [name], MD  Active Self  glucose blood (ACCU-CHEK GUIDE) test strip [id number]  Use as instructed [name], [name], MD  Active Self  hydrochlorothiazide (HYDRODIURIL) 25 MG tablet [id number] Yes Take 1 tablet (25 mg total) by mouth daily. [name], [name], MD 09/10/2020 Active Self  HYDROcodone-acetaminophen (NORCO/VICODIN) 5-325 MG tablet [id number]  Take 1 tablet by mouth every 6 (six) hours as needed for moderate pain. [name], [name], MD  Active Self           Med Note Ivor Reining Sep 05, 2020 10:07 AM) For use in August 2022  HYDROcodone-acetaminophen (NORCO/VICODIN) 5-325 MG tablet [id number]  Take 1 tablet by mouth every 6 (six) hours as needed for moderate pain. [name], [name], MD  Active Self  Med Note Ivor Reining Sep 05, 2020 10:07 AM) For use in July 2022  HYDROcodone-acetaminophen (NORCO/VICODIN) 5-325 MG tablet [id number] Yes Take 1 tablet by mouth every 6 (six) hours as needed for moderate pain. [name], [name], MD 09/10/2020 Active Self  lactulose (CONSTULOSE) 10 GM/15ML solution [id number] No TAKE 30 MILLILITERS BY MOUTH  THREE TIMES DAILY AS NEEDED FOR MILD CONSTIPATION  Patient taking differently: Take 20 g by mouth 3 (three) times daily as needed for moderate constipation.   [name], [name], MD Unknown Active   lidocaine (LIDODERM) 5 %  [id number] Yes Place 1 patch onto the skin daily. Remove & Discard patch within 12 hours or as directed by MD Quintella Reichert, MD Past Month Active Self  linaclotide Rolan Lipa) 72 MCG capsule [id number] Yes Take 72 mcg by mouth daily before breakfast. [provider] Past Week Active Self  losartan (COZAAR) 100 MG tablet [id number] Yes Take 1 tablet (100 mg total) by mouth daily. [name], [name], MD  Active Self  metFORMIN (GLUCOPHAGE-XR) 500 MG 24 hr tablet [id number] Yes Take 1 tablet (500 mg total) by mouth daily with breakfast. [name], [name], MD 09/10/2020 Active Self  PEG-KCl-NaCl-NaSulf-Na Asc-C (PLENVU) 140 g SOLR [id number] Yes Take 140 g by mouth as directed. Manufacturer's coupon Universal coupon code:BIN: [id number]; GROUP: [id number]; PCNPryor Curia; ID: [id number]; PAY NO MORE $50 [name], MD Past Week Active Self  Pirfenidone (ESBRIET) 801 MG TABS [id number] Yes Take 1 tablet by mouth 3 (three) times daily.  Patient taking differently: Take 801 mg by mouth 3 (three) times daily.   [name], MD 09/11/2020 Active   polyethylene glycol powder (GLYCOLAX/MIRALAX) powder [id number] No Take 17 g by mouth daily.  Patient not taking: No sig reported   [name], [name], MD Not Taking Consider Medication Status and Discontinue (Patient Preference) Self           Med Note [name], JACQUELINE L   Wed Dec 13, 2019 10:34 AM)    rivaroxaban (XARELTO) 20 MG TABS tablet [id number] Yes TAKE 1 TABLET BY MOUTH ONCE DAILY WITH SUPPER  Patient taking differently: Take 20 mg by mouth daily with supper.   [name], MD 09/04/2020 Active   senna (SENOKOT) 8.6 MG TABS tablet [id number] No Take 1 tablet by mouth daily as needed for mild constipation. [provider] Unknown Active Self            Patient Active Problem List   Diagnosis Date Noted   Cervical radiculopathy 09/11/2020   Vitamin D deficiency 12/05/2019   Healthcare maintenance  11/09/2018   IPF (idiopathic pulmonary fibrosis) ([place]) 11/09/2018   Therapeutic drug monitoring 04/20/2018   Paroxysmal atrial fibrillation ([place]) 04/20/2018   Atrial fibrillation with RVR ([place]) 11/03/2017   LVH (left ventricular hypertrophy) 09/22/2017   Diabetes mellitus without complication ([place]) 38/12/1749   Degeneration of lumbar intervertebral disc 07/06/2017   Pulmonary emboli ([place]) 05/02/2016   Right leg pain 05/01/2016   ILD (interstitial lung disease) ([place]) 05/01/2016   Bradycardia 05/01/2016   Coronary artery calcification seen on CAT scan 08/06/2015   Open angle with borderline findings, low risk, bilateral 04/29/2015   UIP (usual interstitial pneumonitis) ([place]) 04/05/2015   Bilateral posterior capsular opacification 09/21/2014   Pseudophakia of both eyes 07/27/2014   Combined form of senile cataract of both eyes 05/30/2014   Osteoarthritis 09/17/2011   Narrowing of intervertebral disc space 09/17/2011   Pyelonephritis with possible  nonobstructing 5 mm calculus by renal ultrasound 09/16/2011   Hypokalemia 09/16/2011   External hemorrhoids 09/13/2011   Obstructive sleep apnea syndrome 06/10/2011   Anal fissure 02/17/2011   History of colonic polyps 04/03/2010   Respiratory condition due to chemical fumes and vapors ([place]) 01/20/2010   Urinary incontinence 04/30/2009   Insomnia 09/28/2007   Asthma 11/22/2006   Dyslipidemia 08/19/2006   HTN (hypertension) 08/19/2006   GERD 08/19/2006   Low back pain 08/19/2006    Immunization History  Administered Date(s) Administered   Fluad Quad(high Dose 65+) 11/29/2019   PFIZER(Purple Top)SARS-COV-2 Vaccination 04/21/2019, 05/12/2019, 12/15/2019   PPD Test 11/19/2010   Pneumococcal Polysaccharide-23 11/29/2019    Conditions to be addressed/monitored:  Hypertension, Hyperlipidemia, Diabetes, Atrial Fibrillation, Asthma, Anxiety and fluid retention, back pain, constipation, IPF  Care Plan : [place]  Updates made  by [name], [place] since 09/18/2020 12:00 AM     Problem: Problem: Hypertension, Hyperlipidemia, Diabetes, Atrial Fibrillation, Asthma, Anxiety and fluid retention, back pain, constipation, IPF      Long-Range Goal: Patient-Specific Goal   Start Date: 06/10/2020  Expected End Date: 05/13/2021  Recent Progress: On track  Priority: High  Note:   Current Barriers:  Unable to independently monitor therapeutic efficacy Unable to self administer medications as prescribed  Pharmacist Clinical Goal(s):  Patient will achieve adherence to monitoring guidelines and medication adherence to achieve therapeutic efficacy achieve ability to self administer medications as prescribed through use of adherence packaging as evidenced by patient report through collaboration with PharmD and provider.   Interventions: 1:1 collaboration with [name], [name], MD regarding development and update of comprehensive plan of care as evidenced by provider attestation and co-signature Inter-disciplinary care team collaboration (see longitudinal plan of care) Comprehensive medication review performed; medication list updated in electronic medical record  Hypertension (BP goal <140/90) -Controlled -Current treatment: Amlodipine 46m, 1 tablet once daily Hydrochlorothiazide 265m 1 tablet once daily Losartan 10028m1 tablet once daily -Medications previously tried: benazepril, metoprolol  -Current home readings: patient just bought a BP cuff and plans to start using -Current dietary habits: patient reports working on it and has cut out salt and pork; patient tries to eat a lot more fish and vegetables -Current exercise habits: patient reports she walks most days; recommended goal of 150 min/week  -Denies hypotensive/hypertensive symptoms -Educated on Daily salt intake goal < 2300 mg; Exercise goal of 150 minutes per week; Importance of home blood pressure monitoring; -Counseled to monitor BP at home  at least weekly, document, and provide log at future appointments -Counseled on diet and exercise extensively Recommended to continue current medication  Hyperlipidemia: (LDL goal < 70) -Uncontrolled -Current treatment: Atorvastatin 69m65m tablet once daily Ezetimibe 10 mg, 1 tablet once daily -Medications previously tried: lovastatin  -Current dietary patterns: patient tries to eat a lot more fish and vegetables -Current exercise habits:  patient reports she walks most days; recommended goal of 150 min/week  -Educated on Cholesterol goals;  Benefits of statin for ASCVD risk reduction; Importance of limiting foods high in cholesterol; Exercise goal of 150 minutes per week; -Counseled on diet and exercise extensively Recommended to continue current medication Recommended repeat lipid panel as this has not been checked since addition of Zetia  Diabetes (A1c goal <7%) -Controlled -Current medications: Metformin XR 500 mg 1 tablet before breakfast -Medications previously tried: none  -Current home glucose readings: reviewed CGM data and patient is consistently having lows in the morning (9% of BG  readings were < 70).  fasting glucose: n/a post prandial glucose: n/a -Denies hypoglycemic/hyperglycemic symptoms -Current meal patterns:  breakfast: n/a  lunch: n/a  dinner: n/a snacks: n/a drinks: n/a -Current exercise: walking daily -Educated on Exercise goal of 150 minutes per week; Prevention and management of hypoglycemic episodes; Continuous glucose monitoring; Carbohydrate counting and/or plate method -Counseled to check feet daily and get yearly eye exams -Collaborated with PCP about trial off of metformin to see if the low BGs stop  Atrial Fibrillation (Goal: prevent stroke and major bleeding) -Controlled -CHADSVASC: 5 -Current treatment: Rate control: Multaq 400 mg 1 tablet twice daily with a meal Anticoagulation: Xarelto 20 mg 1 tablet daily with dinner -Medications  previously tried: none -Home BP and HR readings: did not provide any -Counseled on  the importance of taking both medications with a meal -Recommended to continue current medication Patient has resumed Xarelto since surgery.  Asthma/IPF (Goal: control symptoms and prevent exacerbations) -Controlled -Current treatment  Albuterol HFA 121mg, act inhaler, inhale 1 puff every six hours as needed  Pirfenidone 8047m 1 tablet in the morning, at noon, and at bedtime -Medications previously tried: none  -Patient denies consistent use of maintenance inhaler -Frequency of rescue inhaler use: as needed -Counseled on Proper inhaler technique; When to use rescue inhaler -Recommended to continue current medication   Fluid retention (Goal: minimize swelling) -Controlled -Current treatment  Furosemide 4068m.5 tablet once daily -Medications previously tried: none  -Recommended to continue current medication Counseled on limiting salt intake to prevent fluid retention  Back pain (Goal: minimize symptoms of pain) -Controlled -Current treatment  Hydrocodone/APAP 10/325m47m tablet every six hours as needed (takes as indicated) Cyclobenzaprine 5mg,37mablet at bedtime  Diclofenac 1% gel, apply 2 g four times daily  APAP 500 mg, 1 tablet as needed for pain -Medications previously tried: meloxicam, naproxen, oxycodone  -Recommended to continue current medication  Anxiety (Goal: minimize symptoms) -Not ideally controlled -Current treatment: Alprazolam 0.25mg,45mtablet three times daily as needed for anxiety -Medications previously tried/failed: none -GAD7: n/a -Educated on Benefits of medication for symptom control -Recommended to continue current medication  Constipation (Goal: normal bowel movements) -Controlled -Current treatment  docusate 100mg, 65mpsule twice daily Lactulose solution, take 30 ML three times daily as needed for mild constipation Miralax, 17g one daily (not a part of  daily regimen) Linzess 145 mcg daily -Medications previously tried: bisacodyl, Linzess (previous loose stools)  -Recommended to continue current medication Assessed patient finances. Provided PAP application for Linzess in office.   Health Maintenance -Vaccine gaps: shingles, tetanus -Current therapy:  Biotin 5000 mcg 1 capsule daily -Educated on Cost vs benefit of each product must be carefully weighed by individual consumer -Patient is satisfied with current therapy and denies issues -Recommended to continue current medication  Patient Goals/Self-Care Activities Patient will:  - take medications as prescribed check blood pressure at least weekly, document, and provide at future appointments target a minimum of 150 minutes of moderate intensity exercise weekly  Follow Up Plan: Face to Face appointment with care management team member scheduled for:  1 month       Medication Assistance: None required.  Patient affirms current coverage meets needs.   Compliance/Adherence/Medication fill history: Care Gaps: Shingrix, foot exam, Hep C screening, eye exam  [facility]: Atorvastatin 80 mg - last filled 06/14/20 for 90 ds at [name] Losartan 100 mg - last filled 06/26/20 for 63 ds at [name] Metformin ER 500 mg - last filled 07/31/20 for  90 ds at [facility] preferred pharmacy is:  [facility] ([facility] [place] - [place] [address] [place] ([place]) [place] [address] Phone: [phone] Fax: [phone]  [place], [place] [address] Phone: [phone] Fax: [phone]  [name] - [place], [place] - [address] [place] [address] Phone: [phone] Fax: [phone]  Uses pill box? Yes Pt endorses 90% compliance   We discussed: Benefits of medication synchronization, packaging  and delivery as well as enhanced pharmacist oversight with [name]. Patient decided to: Utilize [name] pharmacy for medication synchronization, packaging and delivery  Care Plan and Follow Up Patient Decision:  Patient agrees to Care Plan and Follow-up.  Plan: Face to Face appointment with care management team member scheduled for: 1 month (to review CGM data)  [name], PharmD [place] Pharmacist [place] at [place] [phone]

## 2020-09-18 NOTE — Addendum Note (Signed)
Addended by: Denna Haggard K on: 09/18/2020 11:07 AM   Modules accepted: Orders

## 2020-09-20 ENCOUNTER — Other Ambulatory Visit: Payer: Self-pay | Admitting: Internal Medicine

## 2020-09-20 DIAGNOSIS — E559 Vitamin D deficiency, unspecified: Secondary | ICD-10-CM

## 2020-09-20 MED ORDER — VITAMIN D (ERGOCALCIFEROL) 1.25 MG (50000 UNIT) PO CAPS: 50000.0000 [IU] | ORAL_CAPSULE | ORAL | 0 refills | Status: AC

## 2020-09-24 ENCOUNTER — Telehealth: Payer: Self-pay | Admitting: Pharmacist

## 2020-09-24 NOTE — Telephone Encounter (Signed)
Called patient to see if she already picked up vitamin D from [facility]. Patient confirmed that she had.  Patient inquired about her pain medication and when it was due for a refill. She believes she will be out of the medication within the next week and didn't want to run out.  Routing message to PCP to make her aware of patient's request.

## 2020-09-24 NOTE — Telephone Encounter (Signed)
Spoke with pharmacist at [name] and the patient did receive #60 09/13/20.  Pharmacist states that it will only be a 8 day supply because the directions say take 1-2 tablets every 6 hours.   Would you like an office visit?

## 2020-09-26 NOTE — Telephone Encounter (Signed)
Spoke with the pharmacist at [facility] and there are 2 remaining refills.  [facility] does not have the "strength" of Hydrocodone, but it should be filled by Monday.  Patient is aware.

## 2020-09-27 ENCOUNTER — Other Ambulatory Visit: Payer: Self-pay | Admitting: Internal Medicine

## 2020-09-27 DIAGNOSIS — F419 Anxiety disorder, unspecified: Secondary | ICD-10-CM

## 2020-10-16 ENCOUNTER — Ambulatory Visit: Payer: HMO

## 2020-10-18 ENCOUNTER — Telehealth: Payer: Self-pay

## 2020-10-18 ENCOUNTER — Other Ambulatory Visit: Payer: Self-pay

## 2020-10-18 ENCOUNTER — Telehealth: Payer: Self-pay | Admitting: Pharmacist

## 2020-10-18 MED ORDER — FREESTYLE LIBRE 14 DAY SENSOR MISC: 5 refills | Status: DC

## 2020-10-18 NOTE — Telephone Encounter (Signed)
Patient called wanting Rx refill Sensor (FREESTYLE LIBRE 14 DAY SENSOR) MISC Rx sent to pharmacy

## 2020-10-18 NOTE — Telephone Encounter (Addendum)
Reviewed chart for medication changes ahead of medication coordination call.  No OVs, Consults, or hospital visits since last care coordination call/Pharmacist visit.  No medication changes indicated OR if recent visit, treatment plan here.  BP Readings from Last 3 Encounters:  09/18/20 132/76  09/13/20 (!) 147/58  09/09/20 (!) 143/65    Lab Results  Component Value Date   HGBA1C 5.8 (A) 06/20/2020     Patient obtains medications through Adherence Packaging  30 Days   Last adherence delivery included:  Hydrochlorothiazide (HYDRODIURIL) 25 MG tablet: one tablet at breakfast Atorvastatin (LIPITOR) 80 MG tablet: one tablet at dinner Cyclobenzaprine (Flexeril) 5 mg tablet: one tablet at dinner Rivaroxaban (XARELTO) 20 MG TABS tablet: One tablet at dinner Furosemide (LASIX) 20 MG tablet: one tablet at breakfast Amlodipine (NORVASC) 10 MG tablet: one tablet at breakfast Ezetimibe (ZETIA) 10 MG tablet: one tablet at breakfast Dronedarone (MULTAQ) 400 MG tablet: one tablet at lunch and one tablet at dinner Continuous Blood Gluc Sensor (FREESTYLE LIBRE 14 DAY SENSOR) MISC  Alprazolam (XANAX) 0.25 MG PRN Losartan 100 mg tablet: one tablet at dinner Docusate 100 mg capsule: one capsule at breakfast and at dinner  Patient declined (meds) last month due to PRN use/additional supply on hand: FREESTYLE LIBRE 14 DAY READER DEVICE  Patient is due for next adherence delivery on: 10/29/20. Called patient and reviewed medications and coordinated delivery.  This delivery to include: Hydrochlorothiazide (HYDRODIURIL) 25 MG tablet: one tablet at breakfast Atorvastatin (LIPITOR) 80 MG tablet: one tablet at dinner Cyclobenzaprine (Flexeril) 5 mg tablet: one tablet at dinner Rivaroxaban (XARELTO) 20 MG TABS tablet: One tablet at dinner Furosemide (LASIX) 20 MG tablet: one tablet at breakfast Amlodipine (NORVASC) 10 MG tablet: one tablet at breakfast Ezetimibe (ZETIA) 10 MG tablet: one tablet at  breakfast Dronedarone (MULTAQ) 400 MG tablet: one tablet at lunch and one tablet at dinner Losartan 100 mg tablet: one tablet at dinner Docusate 100 mg capsule: one capsule at breakfast and at dinner Metformin XR 500 mg tablet: one tablet at breakfast (vials due to PRN use)   Patient declined the following medications (meds) due to:  FREESTYLE LIBRE 14 DAY READER) DEVI Continuous Blood Gluc Sensor (FREESTYLE LIBRE 14 DAY SENSOR) MISC (filled with [facility]) Alprazolam (XANAX) 0.25 MG PRN  Patient needs refills for no medications.  Confirmed delivery date of 10/29/20, advised patient that pharmacy will contact them the morning of delivery.

## 2020-10-23 ENCOUNTER — Telehealth: Payer: Self-pay | Admitting: Pharmacist

## 2020-10-23 NOTE — Chronic Care Management (AMB) (Signed)
Chronic Care Management Pharmacy Assistant   Name: Miranda [name]  MRN: [id number] DOB: 02/15/1946   10-23-2020 Per Clinical pharmacist attempting to find a bowel prep kit coupon for Plenvu, patient was given a coupon to process Patient is medicare part D and medication is over 100.00 per Pharmacy.   Per Manufacturer savings card info : Offer is not valid for any person eligible for reimbursement of prescriptions, in whole or in part , by any fed, state or other govt programs including medicare, medicare adv, and part A,B and D plans.  Able to locate Plenvu medicare Part D coupon program sponsored by [employer] .Patient would have to opt out of using her Medicare Part D prescription benefit in conjunction with this offer. Filled out coupon request and submitted card to pharmacy to try and updated CPP. No free trials found.    Medications: Outpatient Encounter Medications as of 10/23/2020  Medication Sig   Accu-Chek Softclix Lancets lancets Use as instructed   acetaminophen (TYLENOL) 325 MG tablet Take 650 mg by mouth every 6 (six) hours as needed for moderate pain.   albuterol (VENTOLIN HFA) 108 (90 Base) MCG/ACT inhaler Inhale 1 puff into the lungs every 6 (six) hours as needed. (Patient taking differently: Inhale 1 puff into the lungs every 6 (six) hours as needed for shortness of breath or wheezing.)   ALPRAZolam (XANAX) 0.25 MG tablet TAKE ONE TABLET BY MOUTH THREE times daily AS NEEDED FOR ANXIETY   amLODipine (NORVASC) 10 MG tablet Take 1 tablet (10 mg total) by mouth daily.   atorvastatin (LIPITOR) 80 MG tablet TAKE ONE TABLET BY MOUTH EVERY EVENING   Continuous Blood Gluc Receiver (FREESTYLE LIBRE 14 DAY READER) DEVI 1 each by Does not apply route daily.   Continuous Blood Gluc Sensor (FREESTYLE LIBRE 14 DAY SENSOR) MISC USE 1  ONCE DAILY   cyclobenzaprine (FLEXERIL) 5 MG tablet TAKE ONE TABLET BY MOUTH EVERY EVENING   diclofenac sodium (VOLTAREN) 1 % GEL Apply 2 g  topically 4 (four) times daily. (Patient taking differently: Apply 2 g topically daily as needed (pain).)   docusate sodium (COLACE) 100 MG capsule Take 1 capsule (100 mg total) by mouth 2 (two) times daily.   dronedarone (MULTAQ) 400 MG tablet TAKE 1 TABLET BY MOUTH TWICE DAILY WITH A MEAL (Patient taking differently: Take 400 mg by mouth 2 (two) times daily with a meal.)   ezetimibe (ZETIA) 10 MG tablet Take 1 tablet (10 mg total) by mouth daily.   furosemide (LASIX) 40 MG tablet Take 1/2 (one-half) tablet by mouth once daily (Patient taking differently: Take 20 mg by mouth daily.)   glucose blood (ACCU-CHEK AVIVA PLUS) test strip Use once daily for glucose control.  Dx E11.9  For accu-chek aviva   glucose blood (ACCU-CHEK GUIDE) test strip Use as instructed   hydrochlorothiazide (HYDRODIURIL) 25 MG tablet Take 1 tablet (25 mg total) by mouth daily.   HYDROcodone-acetaminophen (NORCO/VICODIN) 5-325 MG tablet Take 1-2 tablets by mouth every 6 (six) hours as needed for severe pain or moderate pain ((score 7 to 10)).   lactulose (CONSTULOSE) 10 GM/15ML solution TAKE 30 MILLILITERS BY MOUTH  THREE TIMES DAILY AS NEEDED FOR MILD CONSTIPATION (Patient taking differently: Take 20 g by mouth 3 (three) times daily as needed for moderate constipation.)   lidocaine (LIDODERM) 5 % Place 1 patch onto the skin daily. Remove & Discard patch within 12 hours or as directed by MD   linaclotide (LINZESS) 72 MCG  capsule Take 72 mcg by mouth daily before breakfast.   losartan (COZAAR) 100 MG tablet Take 1 tablet (100 mg total) by mouth daily.   metFORMIN (GLUCOPHAGE-XR) 500 MG 24 hr tablet Take 1 tablet (500 mg total) by mouth daily with breakfast.   methylPREDNISolone (MEDROL DOSEPAK) 4 MG TBPK tablet Take as instructed on packet   PEG-KCl-NaCl-NaSulf-Na Asc-C (PLENVU) 140 g SOLR Take 140 g by mouth as directed. Manufacturer's coupon Universal coupon code:BIN: P2366821; GROUP: [id number]; PCN: [id number]; ID: [id number]; PAY  NO MORE $50   Pirfenidone (ESBRIET) 801 MG TABS Take 1 tablet by mouth 3 (three) times daily. (Patient taking differently: Take 801 mg by mouth 3 (three) times daily.)   polyethylene glycol powder (GLYCOLAX/MIRALAX) powder Take 17 g by mouth daily.   rivaroxaban (XARELTO) 20 MG TABS tablet Take 20 mg by mouth daily with supper.   senna (SENOKOT) 8.6 MG TABS tablet Take 1 tablet by mouth daily as needed for mild constipation.   Vitamin D, Ergocalciferol, (DRISDOL) 1.25 MG (50000 UNIT) CAPS capsule Take 1 capsule (50,000 Units total) by mouth every 7 (seven) days for 12 doses.   No facility-administered encounter medications on file as of 10/23/2020.     [place] Clinical Pharmacist Assistant [phone]

## 2020-10-31 ENCOUNTER — Telehealth: Payer: Self-pay | Admitting: Gastroenterology

## 2020-10-31 MED ORDER — PLENVU 140 G PO SOLR: 140.0000 g | ORAL | 0 refills | Status: DC

## 2020-10-31 NOTE — Telephone Encounter (Signed)
Inbound call from patient. Have questions about prep and prep instructions.

## 2020-10-31 NOTE — Telephone Encounter (Signed)
Spoke to patient she has moved her procedure date and needed new instructions mailed to her home address.

## 2020-11-04 DIAGNOSIS — M5412 Radiculopathy, cervical region: Secondary | ICD-10-CM | POA: Diagnosis not present

## 2020-11-07 ENCOUNTER — Encounter: Payer: HMO | Admitting: Gastroenterology

## 2020-11-08 NOTE — Telephone Encounter (Signed)
Spoke to Miranda [name]. We reviewed the prep instructions for her colonoscopy.

## 2020-11-08 NOTE — Telephone Encounter (Signed)
Inbound call from patient. Would like a call to discuss the instructions again with her now that she have received them in the mail to fully understand and read along.

## 2020-11-14 ENCOUNTER — Other Ambulatory Visit: Payer: Self-pay | Admitting: Internal Medicine

## 2020-11-14 DIAGNOSIS — I10 Essential (primary) hypertension: Secondary | ICD-10-CM

## 2020-11-14 DIAGNOSIS — I480 Paroxysmal atrial fibrillation: Secondary | ICD-10-CM

## 2020-11-14 HISTORY — PX: COLONOSCOPY: SHX174

## 2020-11-14 NOTE — Telephone Encounter (Signed)
[provider] [name], [facility]   Outpatient Medication Detail   Disp Refills Start End   rivaroxaban (XARELTO) 20 MG TABS tablet       Sig - Route: Take 20 mg by mouth daily with supper. - Oral   Class: Historical Med    Okay to refill?

## 2020-11-19 ENCOUNTER — Telehealth: Payer: Self-pay | Admitting: Gastroenterology

## 2020-11-19 NOTE — Telephone Encounter (Signed)
Patient called after hours with concerns about cost of Plenvu prescription for colonoscopy scheduled for 11/21/20. She cannot afford the $140 cost. Please call her tomorrow morning to find an alternative bowel prep. Thank you.

## 2020-11-20 NOTE — Telephone Encounter (Signed)
Pt will come to the office to pick up a free Plenvu kit

## 2020-11-20 NOTE — Telephone Encounter (Signed)
Hi Toni, could you pls help this pt with her prep? She spoke with Dr. Tarri Glenn yesterday and her procedure is tomorrow. Thank you.

## 2020-11-21 ENCOUNTER — Ambulatory Visit (AMBULATORY_SURGERY_CENTER): Payer: HMO | Admitting: Gastroenterology

## 2020-11-21 ENCOUNTER — Ambulatory Visit: Payer: HMO | Admitting: Cardiology

## 2020-11-21 ENCOUNTER — Encounter: Payer: Self-pay | Admitting: Gastroenterology

## 2020-11-21 ENCOUNTER — Other Ambulatory Visit: Payer: Self-pay

## 2020-11-21 VITALS — BP 150/84 | HR 59 | Temp 95.3°F | Resp 16 | Ht 66.0 in | Wt 177.0 lb

## 2020-11-21 DIAGNOSIS — G4730 Sleep apnea, unspecified: Secondary | ICD-10-CM | POA: Diagnosis not present

## 2020-11-21 DIAGNOSIS — D123 Benign neoplasm of transverse colon: Secondary | ICD-10-CM

## 2020-11-21 DIAGNOSIS — D120 Benign neoplasm of cecum: Secondary | ICD-10-CM | POA: Diagnosis not present

## 2020-11-21 DIAGNOSIS — Z800 Family history of malignant neoplasm of digestive organs: Secondary | ICD-10-CM | POA: Diagnosis not present

## 2020-11-21 DIAGNOSIS — F419 Anxiety disorder, unspecified: Secondary | ICD-10-CM | POA: Diagnosis not present

## 2020-11-21 DIAGNOSIS — Z1211 Encounter for screening for malignant neoplasm of colon: Secondary | ICD-10-CM

## 2020-11-21 DIAGNOSIS — D122 Benign neoplasm of ascending colon: Secondary | ICD-10-CM

## 2020-11-21 DIAGNOSIS — K5730 Diverticulosis of large intestine without perforation or abscess without bleeding: Secondary | ICD-10-CM | POA: Diagnosis not present

## 2020-11-21 MED ORDER — SODIUM CHLORIDE 0.9 % IV SOLN: 500.0000 mL | Freq: Once | INTRAVENOUS | Status: DC

## 2020-11-21 NOTE — Progress Notes (Signed)
History and Physical:  This patient presents for endoscopic testing for: Encounter Diagnoses  Name Primary?   Special screening for malignant neoplasms, colon Yes   Chronic constipation    Clinical details in 08/29/20 office note. Patient has since had a cervical spine fusion (09/11/20) On Xarelto for A fib Chronic constipation Family history colon cancer    Past Medical History: Past Medical History:  Diagnosis Date   Anxiety    Arthritis    Asthma    Atrial fibrillation (HCC)    CHF (congestive heart failure) ([place])    pt. unsure- but thinks she was hosp. for CHF- 2002   Ch[facility] kidney disease    [facility]   Clotting disorder ([place])    blood clots in lungs/PE pulmonary embolism   Colon polyps    Complication of anesthesia    states requires a lot med. to put her to sleep    DDD (degenerative disc disease) 09/17/2011   Depression    "sometimes "   DM (diabetes mellitus) ([place])    Family history of anesthesia complication    Sister had difficulty waking up from anesthesia   GERD (gastroesophageal reflux disease)    Glaucoma    bilateral, pt. admits that she is noncompliant to eye gtts.    Hemorrhoids    Hyperlipidemia    Hypertension    had stress, echo- 2006 /w [place], Cardiac Cath, per pt. 2002, echo repeated 2012- wnl    IPF (idiopathic pulmonary fibrosis) (HCC)    Low back pain    Shortness of breath    Sleep apnea    uses c-pap- q night recently     Past Surgical History: Past Surgical History:  Procedure Laterality Date   ABDOMINAL HYSTERECTOMY     ectopic, fibroids   ANTERIOR CERVICAL DECOMP/DISCECTOMY FUSION N/A 09/11/2020   Procedure: Anterior Cervical Decompression Fusion Cervical four-five, Cervical five-six;  Surgeon: [name] Mare, MD;  Location: [facility];  Service: Neurosurgery;  Laterality: N/A;   arm surgery Right    CARDIAC CATHETERIZATION     CATARACT EXTRACTION, BILATERAL     cataracts removed bilateral- ?IOL   COLONOSCOPY      remote   FISSURECTOMY  10/08/2011   Procedure: FISSURECTOMY;  Surgeon: Stark Klein, MD;  Location: [facility];  Service: General;  Laterality: N/A;   FLEXIBLE SIGMOIDOSCOPY  02/25/2011   Procedure: FLEXIBLE SIGMOIDOSCOPY;  Surgeon: Inda Castle, MD;  Location: [facility];  Service: Endoscopy;  Laterality: N/A;   FOOT SURGERY     bilat, heel spurs- screw in R foot    HEMORRHOID SURGERY  10/08/2011   Procedure: HEMORRHOIDECTOMY;  Surgeon: Stark Klein, MD;  Location: [facility];  Service: General;  Laterality: N/A;  External    ROTATOR CUFF REPAIR Right    SPHINCTEROTOMY  10/08/2011   Procedure: Joan Mayans;  Surgeon: Stark Klein, MD;  Location: [facility];  Service: General;  Laterality: N/A;    Allergies: Allergies  Allergen Reactions   Fish Oil Anaphylaxis   Other Hives, Shortness Of Breath and Swelling    Allergic to cashew nuts and peanut oil.   Penicillins Anaphylaxis, Hives and Swelling    Has patient had a PCN reaction causing immediate rash, facial/tongue/throat swelling, SOB or lightheadedness with hypotension: Face swelling and hives started first, then swelling of the throat  Has patient had a PCN reaction causing severe rash involving mucus membranes or skin necrosis: Yes  Has patient had a PCN reaction that required hospitalization: No  Has  patient had a PCN reaction occurring within the last 10 years: Yes  If all of the above answers are "NO", then may proceed with Cephalospor   Pneumococcal Vaccines Nausea And Vomiting   Cashew Nut Oil    Nitrofurantoin Macrocrystal Hives   Peanut Oil    Aspirin Itching and Rash   Bactrim [Sulfamethoxazole-Trimethoprim] Hives, Itching and Rash   Ciprofloxacin Hives, Itching and Rash   Ibuprofen Rash   Influenza Vaccines Hives   Ivp Dye [Iodinated Diagnostic Agents] Hives, Itching and Rash    Gives benadryl to counteract symptoms   Latex Rash   Macrobid [Nitrofurantoin Monohydrate Macrocrystals] Hives   Shellfish Allergy Hives     Patient also allergic to seafood    Outpatient Meds: Current Outpatient Medications  Medication Sig Dispense Refill   Accu-Chek Softclix Lancets lancets Use as instructed 100 each 12   acetaminophen (TYLENOL) 325 MG tablet Take 650 mg by mouth every 6 (six) hours as needed for moderate pain.     ALPRAZolam (XANAX) 0.25 MG tablet TAKE ONE TABLET BY MOUTH THREE times daily AS NEEDED FOR ANXIETY 90 tablet 0   amLODipine (NORVASC) 10 MG tablet Take 1 tablet (10 mg total) by mouth daily. 90 tablet 1   atorvastatin (LIPITOR) 80 MG tablet TAKE ONE TABLET BY MOUTH EVERY EVENING 90 tablet 1   cyclobenzaprine (FLEXERIL) 5 MG tablet TAKE ONE TABLET BY MOUTH EVERY EVENING 90 tablet 1   dronedarone (MULTAQ) 400 MG tablet TAKE 1 TABLET BY MOUTH TWICE DAILY WITH A MEAL (Patient taking differently: Take 400 mg by mouth 2 (two) times daily with a meal.) 180 tablet 3   ezetimibe (ZETIA) 10 MG tablet Take 1 tablet (10 mg total) by mouth daily. 90 tablet 1   furosemide (LASIX) 40 MG tablet Take 1/2 (one-half) tablet by mouth once daily (Patient taking differently: Take 20 mg by mouth daily.) 45 tablet 1   hydrochlorothiazide (HYDRODIURIL) 25 MG tablet TAKE ONE TABLET BY MOUTH ONCE DAILY 90 tablet 1   HYDROcodone-acetaminophen (NORCO/VICODIN) 5-325 MG tablet Take 1-2 tablets by mouth every 6 (six) hours as needed for severe pain or moderate pain ((score 7 to 10)). 60 tablet 0   losartan (COZAAR) 100 MG tablet TAKE ONE TABLET BY MOUTH EVERY EVENING 90 tablet 2   metFORMIN (GLUCOPHAGE-XR) 500 MG 24 hr tablet Take 1 tablet (500 mg total) by mouth daily with breakfast. 90 tablet 1   Pirfenidone (ESBRIET) 801 MG TABS Take 1 tablet by mouth 3 (three) times daily. (Patient taking differently: Take 801 mg by mouth 3 (three) times daily.) 90 tablet 10   albuterol (VENTOLIN HFA) 108 (90 Base) MCG/ACT inhaler Inhale 1 puff into the lungs every 6 (six) hours as needed. (Patient taking differently: Inhale 1 puff into the lungs  every 6 (six) hours as needed for shortness of breath or wheezing.) 18 g 1   Continuous Blood Gluc Receiver (FREESTYLE LIBRE 14 DAY READER) DEVI 1 each by Does not apply route daily. 1 each 2   Continuous Blood Gluc Sensor (FREESTYLE LIBRE 14 DAY SENSOR) MISC USE 1  ONCE DAILY 2 each 5   diclofenac sodium (VOLTAREN) 1 % GEL Apply 2 g topically 4 (four) times daily. (Patient taking differently: Apply 2 g topically daily as needed (pain).) 1 Tube 1   docusate sodium (COLACE) 100 MG capsule Take 1 capsule (100 mg total) by mouth 2 (two) times daily. 60 capsule 2   glucose blood (ACCU-CHEK AVIVA PLUS) test strip Use once  daily for glucose control.  Dx E11.9  For accu-chek aviva 100 each 12   glucose blood (ACCU-CHEK GUIDE) test strip Use as instructed 100 each 12   lactulose (CONSTULOSE) 10 GM/15ML solution TAKE 30 MILLILITERS BY MOUTH  THREE TIMES DAILY AS NEEDED FOR MILD CONSTIPATION (Patient taking differently: Take 20 g by mouth 3 (three) times daily as needed for moderate constipation.) 473 mL 1   lidocaine (LIDODERM) 5 % Place 1 patch onto the skin daily. Remove & Discard patch within 12 hours or as directed by MD 15 patch 0   linaclotide (LINZESS) 72 MCG capsule Take 72 mcg by mouth daily before breakfast.     methylPREDNISolone (MEDROL DOSEPAK) 4 MG TBPK tablet Take as instructed on packet 1 each 0   polyethylene glycol powder (GLYCOLAX/MIRALAX) powder Take 17 g by mouth daily. 3350 g 1   senna (SENOKOT) 8.6 MG TABS tablet Take 1 tablet by mouth daily as needed for mild constipation.     Vitamin D, Ergocalciferol, (DRISDOL) 1.25 MG (50000 UNIT) CAPS capsule Take 1 capsule (50,000 Units total) by mouth every 7 (seven) days for 12 doses. 12 capsule 0   XARELTO 20 MG TABS tablet TAKE ONE TABLET BY MOUTH ONCE DAILY WITH SUPPER 90 tablet 1   Current Facility-Administered Medications  Medication Dose Route Frequency Provider Last Rate Last Admin   0.9 %  sodium chloride infusion  500 mL Intravenous  Once Nelida Meuse III, MD          ___________________________________________________________________ Objective   Exam:  BP (!) 141/73   Pulse 79   Temp (!) 95.3 F (35.2 C)   Ht '5\' 6"'$  (1.676 m)   Wt 177 lb (80.3 kg)   SpO2 98%   BMI 28.57 kg/m   CV: RRR without murmur, S1/S2 Resp: clear to auscultation bilaterally, normal RR and effort noted GI: soft, no tenderness, with active bowel sounds.   Assessment: Encounter Diagnoses  Name Primary?   Special screening for malignant neoplasms, colon Yes   Chronic constipation      Plan: Colonoscopy  The benefits and risks of the planned procedure were described in detail with the patient or (when appropriate) their health care proxy.  Risks were outlined as including, but not limited to, bleeding, infection, perforation, adverse medication reaction leading to cardiac or pulmonary decompensation, pancreatitis (if ERCP).  The limitation of incomplete mucosal visualization was also discussed.  No guarantees or warranties were given.    The patient is appropriate for an endoscopic procedure in the ambulatory setting.   - Wilfrid Lund, MD

## 2020-11-21 NOTE — Op Note (Signed)
[place] Patient Name: Miranda [name] Procedure Date: 11/21/2020 2:45 PM MRN: [id number] Endoscopist: Mallie Mussel L. Loletha Carrow , MD Age: 75 Referring MD:  Date of Birth: 04/17/1945 Gender: Female Account #: [id number] Procedure:                Colonoscopy Indications:              Screening in patient at increased risk: Family                            history of 1st-degree relative with colorectal                            cancer, (two brothers)                           Patient's last colonoscopy in 2012 Medicines:                Monitored Anesthesia Care Procedure:                Pre-Anesthesia Assessment:                           - Prior to the procedure, a History and Physical                            was performed, and patient medications and                            allergies were reviewed. The patient's tolerance of                            previous anesthesia was also reviewed. The risks                            and benefits of the procedure and the sedation                            options and risks were discussed with the patient.                            All questions were answered, and informed consent                            was obtained. Prior Anticoagulants: The patient has                            taken Xarelto (rivaroxaban), last dose was 2 days                            prior to procedure. Patient on [facility] for A fib - in                            NSR today. After reviewing the risks and benefits,  the patient was deemed in satisfactory condition to                            undergo the procedure.                           After obtaining informed consent, the colonoscope                            was passed under direct vision. Throughout the                            procedure, the patient's blood pressure, pulse, and                            oxygen saturations were monitored continuously. The                             Olympus CF-HQ190L (Serial# [id number]) Colonoscope was                            introduced through the anus and advanced to the the                            cecum, identified by appendiceal orifice and                            ileocecal valve. The colonoscopy was performed with                            difficulty due to a redundant colon and significant                            looping. Successful completion of the procedure was                            aided by changing the patient to a prone position,                            using manual pressure and straightening and                            shortening the scope to obtain bowel loop                            reduction. The patient tolerated the procedure                            well. The quality of the bowel preparation was                            good. The ileocecal valve, appendiceal orifice, and  rectum were photographed. The bowel preparation                            used was Plenvu. Scope In: 2:57:41 PM Scope Out: 3:20:56 PM Scope Withdrawal Time: 0 hours 16 minutes 1 second  Total Procedure Duration: 0 hours 23 minutes 15 seconds  Findings:                 The perianal and digital rectal examinations were                            normal.                           Many diverticula were found in the left colon and                            right colon.                           Four sessile polyps were found in the transverse                            colon, ascending colon and cecum. The polyps were                            diminutive in size. These polyps were removed with                            a cold snare. Resection and retrieval were complete.                           A 10 mm polyp was found in the transverse colon.                            The polyp was sessile. The polyp was removed with a                            cold snare. Resection and retrieval were  complete.                           The exam was otherwise without abnormality on                            direct and retroflexion views. Complications:            No immediate complications. Estimated Blood Loss:     Estimated blood loss was minimal. Impression:               - Diverticulosis in the left colon and in the right                            colon.                           - Four diminutive  polyps in the transverse colon,                            in the ascending colon and in the cecum, removed                            with a cold snare. Resected and retrieved.                           - One 10 mm polyp in the transverse colon, removed                            with a cold snare. Resected and retrieved.                           - The examination was otherwise normal on direct                            and retroflexion views. Recommendation:           - Patient has a contact number available for                            emergencies. The signs and symptoms of potential                            delayed complications were discussed with the                            patient. Return to normal activities tomorrow.                            Written discharge instructions were provided to the                            patient.                           - Resume previous diet.                           - Continue present medications.                           - Await pathology results.                           - Repeat colonoscopy is recommended for                            surveillance. The colonoscopy date will be                            determined after pathology results from today's  exam become available for review.                           - Resume Xarelto (rivaroxaban) at prior dose                            tomorrow evening. Peng Thorstenson L. Loletha Carrow, MD 11/21/2020 3:35:58 PM This report has been signed electronically.

## 2020-11-21 NOTE — Progress Notes (Signed)
Sedate, gd SR, tolerated procedure well, VSS, report to RN 

## 2020-11-21 NOTE — Progress Notes (Signed)
1456 Pt independent turns self to left side to position for procedure, reports being comfortable after turn, cervical spine in alignment

## 2020-11-21 NOTE — Progress Notes (Signed)
VS taken by DT 

## 2020-11-21 NOTE — Patient Instructions (Signed)
Thank you for letting us take care of your heatlhcare needs today. Please see handouts given to you on Polyps and Diverticulosis. You may resume your xarelto tomorrow evening.    YOU HAD AN ENDOSCOPIC PROCEDURE TODAY AT [facility]:   Refer to the procedure report that was given to you for any specific questions about what was found during the examination.  If the procedure report does not answer your questions, please call your gastroenterologist to clarify.  If you requested that your care partner not be given the details of your procedure findings, then the procedure report has been included in a sealed envelope for you to review at your convenience later.  YOU SHOULD EXPECT: Some feelings of bloating in the abdomen. Passage of more gas than usual.  Walking can help get rid of the air that was put into your GI tract during the procedure and reduce the bloating. If you had a lower endoscopy (such as a colonoscopy or flexible sigmoidoscopy) you may notice spotting of blood in your stool or on the toilet paper. If you underwent a bowel prep for your procedure, you may not have a normal bowel movement for a few days.  Please Note:  You might notice some irritation and congestion in your nose or some drainage.  This is from the oxygen used during your procedure.  There is no need for concern and it should clear up in a day or so.  SYMPTOMS TO REPORT IMMEDIATELY:  Following lower endoscopy (colonoscopy or flexible sigmoidoscopy):  Excessive amounts of blood in the stool  Significant tenderness or worsening of abdominal pains  Swelling of the abdomen that is new, acute  Fever of 100F or higher   For urgent or emergent issues, a gastroenterologist can be reached at any hour by calling [phone]. Do not use MyChart messaging for urgent concerns.    DIET:  We do recommend a small meal at first, but then you may proceed to your regular diet.  Drink plenty of fluids but you  should avoid alcoholic beverages for 24 hours.  ACTIVITY:  You should plan to take it easy for the rest of today and you should NOT DRIVE or use heavy machinery until tomorrow (because of the sedation medicines used during the test).    FOLLOW UP: Our staff will call the number listed on your records 48-72 hours following your procedure to check on you and address any questions or concerns that you may have regarding the information given to you following your procedure. If we do not reach you, we will leave a message.  We will attempt to reach you two times.  During this call, we will ask if you have developed any symptoms of COVID 19. If you develop any symptoms (ie: fever, flu-like symptoms, shortness of breath, cough etc.) before then, please call [phone].  If you test positive for Covid 19 in the 2 weeks post procedure, please call and report this information to [place].    If any biopsies were taken you will be contacted by phone or by letter within the next 1-3 weeks.  Please call us at [phone] if you have not heard about the biopsies in 3 weeks.    SIGNATURES/CONFIDENTIALITY: You and/or your care partner have signed paperwork which will be entered into your electronic medical record.  These signatures attest to the fact that that the information above on your After Visit Summary has been reviewed and is understood.  Full responsibility  of the confidentiality of this discharge information lies with you and/or your care-partner.

## 2020-11-21 NOTE — Progress Notes (Signed)
Called to room to assist during endoscopic procedure.  Patient ID and intended procedure confirmed with present staff. Received instructions for my participation in the procedure from the performing physician.  

## 2020-11-22 DIAGNOSIS — M5412 Radiculopathy, cervical region: Secondary | ICD-10-CM | POA: Diagnosis not present

## 2020-11-22 DIAGNOSIS — J9610 Chronic respiratory failure, unspecified whether with hypoxia or hypercapnia: Secondary | ICD-10-CM | POA: Diagnosis not present

## 2020-11-22 DIAGNOSIS — G4733 Obstructive sleep apnea (adult) (pediatric): Secondary | ICD-10-CM | POA: Diagnosis not present

## 2020-11-25 ENCOUNTER — Telehealth: Payer: Self-pay | Admitting: *Deleted

## 2020-11-25 ENCOUNTER — Telehealth: Payer: Self-pay

## 2020-11-25 NOTE — Telephone Encounter (Signed)
Left message on follow up call. 

## 2020-11-25 NOTE — Telephone Encounter (Signed)
  Follow up Call-  Call back number 11/21/2020  Post procedure Call Back phone  # [phone]  Permission to leave phone message Yes  Some recent data might be hidden     Patient questions:  Do you have a fever, pain , or abdominal swelling? No. Pain Score  0 *  Have you tolerated food without any problems? Yes.    Have you been able to return to your normal activities? Yes.    Do you have any questions about your discharge instructions: Diet   No. Medications  No. Follow up visit  No.  Do you have questions or concerns about your Care? No.  Actions: * If pain score is 4 or above: No action needed, pain <4.  Have you developed a fever since your procedure? no  2.   Have you had an respiratory symptoms (SOB or cough) since your procedure? no  3.   Have you tested positive for COVID 19 since your procedure no  4.   Have you had any family members/close contacts diagnosed with the COVID 19 since your procedure?  no   If yes to any of these questions please route to Joylene John, RN and Joella Prince, RN

## 2020-11-27 ENCOUNTER — Encounter: Payer: HMO | Admitting: *Deleted

## 2020-11-27 DIAGNOSIS — Z006 Encounter for examination for normal comparison and control in clinical research program: Secondary | ICD-10-CM

## 2020-11-27 DIAGNOSIS — J84112 Idiopathic pulmonary fibrosis: Secondary | ICD-10-CM

## 2020-11-27 NOTE — Research (Signed)
IPF-PRO Registry  Purpose: To collect data and biological samples that will support future research studies.  The goal of the registry is to research the current approaches to diagnosis, treatment, and progression of IPF. In addition, the registry will analyze participant characteristics, assess quality of life, describe participants interactions with the health care system, determine IPF treatment practices across multiple institutions, and utilize biological samples to identify disease biomarkers.    [facility] [place] Coordinator note :   This visit for Subject Miranda [name] with DOB: 1945/07/29 on 11/27/2020 for the above protocol is Visit/Encounter #8 and is for purpose of research.   The consent for this encounter is under Protocol Version Protocol Amendment 4 (Version Date: 25 November 2017)   Subject expressed continued interest and consent in continuing as a study subject. Subject confirmed that there was no change in contact information (e.g. address, telephone, email). Subject thanked for participation in research and contribution to science.    The subject was informed that the PI Dr. Chase Caller continues to have oversight of the subject's visits and course  through relevant discussions, reviews and also specifically of this visit by routing of this note to the [place].  During this visit on 11/27/2020, the subject completed the blood work and questionnaires per the above listed protocol.  Please refer to the subject's paper source binder for complete details.    Signed by  Lazaro Arms Clinical Research Coordinator  [facility]  [place], [place] 12:48 PM 11/27/2020

## 2020-11-29 ENCOUNTER — Telehealth: Payer: Self-pay | Admitting: Pulmonary Disease

## 2020-11-29 ENCOUNTER — Telehealth: Payer: Self-pay | Admitting: Pharmacist

## 2020-11-29 ENCOUNTER — Telehealth: Payer: Self-pay | Admitting: Internal Medicine

## 2020-11-29 NOTE — Telephone Encounter (Signed)
Pt call and stated

## 2020-11-29 NOTE — Chronic Care Management (AMB) (Signed)
Chronic Care Management Pharmacy Assistant   Name: Miranda [name]  MRN: [id number] DOB: 04-Jul-1945  Reason for Encounter: General Assessment Call    Conditions to be addressed/monitored:HTN, ASTHMA, Diabetes and Anxiety   Recent office visits:  None   Recent consult visits:  11-27-2020 Miranda [name], CMA [facility]) - Patient presented as research subject. No medication changes.  11-21-2020 Miranda Stabler, MD (Endo) - Patient presented for Colonoscopy, No medication changes. Hospital visits:  Medication Reconciliation was completed by comparing discharge summary, patient's EMR and Pharmacy list, and upon discussion with patient.  Patient presented to [facility] on 09-11-2020 for elective surgery & Cervical radiculopathy Discharge date was 09-13-2020   New?Medications Started at [facility] Discharge:?? -started  docusate sodium  methylPREDNISolone   Medication Changes at Hospital Discharge: -Changed  HYDROcodone-acetaminophen  Medications Discontinued at Hospital Discharge: -Stopped  rivaroxaban 20 MG  Medications that remain the same after Hospital Discharge:??  -All other medications will remain the same.    Medications: Outpatient Encounter Medications as of 11/29/2020  Medication Sig   Accu-Chek Softclix Lancets lancets Use as instructed   acetaminophen (TYLENOL) 325 MG tablet Take 650 mg by mouth every 6 (six) hours as needed for moderate pain.   albuterol (VENTOLIN HFA) 108 (90 Base) MCG/ACT inhaler Inhale 1 puff into the lungs every 6 (six) hours as needed. (Patient taking differently: Inhale 1 puff into the lungs every 6 (six) hours as needed for shortness of breath or wheezing.)   ALPRAZolam (XANAX) 0.25 MG tablet TAKE ONE TABLET BY MOUTH THREE times daily AS NEEDED FOR ANXIETY   amLODipine (NORVASC) 10 MG tablet Take 1 tablet (10 mg total) by mouth daily.   atorvastatin (LIPITOR) 80 MG tablet TAKE ONE TABLET BY MOUTH EVERY  EVENING   Continuous Blood Gluc Receiver (FREESTYLE LIBRE 14 DAY READER) DEVI 1 each by Does not apply route daily.   Continuous Blood Gluc Sensor (FREESTYLE LIBRE 14 DAY SENSOR) MISC USE 1  ONCE DAILY   cyclobenzaprine (FLEXERIL) 5 MG tablet TAKE ONE TABLET BY MOUTH EVERY EVENING   diclofenac sodium (VOLTAREN) 1 % GEL Apply 2 g topically 4 (four) times daily. (Patient taking differently: Apply 2 g topically daily as needed (pain).)   docusate sodium (COLACE) 100 MG capsule Take 1 capsule (100 mg total) by mouth 2 (two) times daily.   dronedarone (MULTAQ) 400 MG tablet TAKE 1 TABLET BY MOUTH TWICE DAILY WITH A MEAL (Patient taking differently: Take 400 mg by mouth 2 (two) times daily with a meal.)   ezetimibe (ZETIA) 10 MG tablet Take 1 tablet (10 mg total) by mouth daily.   furosemide (LASIX) 40 MG tablet Take 1/2 (one-half) tablet by mouth once daily (Patient taking differently: Take 20 mg by mouth daily.)   glucose blood (ACCU-CHEK AVIVA PLUS) test strip Use once daily for glucose control.  Dx E11.9  For accu-chek aviva   glucose blood (ACCU-CHEK GUIDE) test strip Use as instructed   hydrochlorothiazide (HYDRODIURIL) 25 MG tablet TAKE ONE TABLET BY MOUTH ONCE DAILY   HYDROcodone-acetaminophen (NORCO/VICODIN) 5-325 MG tablet Take 1-2 tablets by mouth every 6 (six) hours as needed for severe pain or moderate pain ((score 7 to 10)).   lactulose (CONSTULOSE) 10 GM/15ML solution TAKE 30 MILLILITERS BY MOUTH  THREE TIMES DAILY AS NEEDED FOR MILD CONSTIPATION (Patient taking differently: Take 20 g by mouth 3 (three) times daily as needed for moderate constipation.)   lidocaine (LIDODERM) 5 % Place 1  patch onto the skin daily. Remove & Discard patch within 12 hours or as directed by MD   linaclotide (LINZESS) 72 MCG capsule Take 72 mcg by mouth daily before breakfast.   losartan (COZAAR) 100 MG tablet TAKE ONE TABLET BY MOUTH EVERY EVENING   metFORMIN (GLUCOPHAGE-XR) 500 MG 24 hr tablet Take 1 tablet  (500 mg total) by mouth daily with breakfast.   methylPREDNISolone (MEDROL DOSEPAK) 4 MG TBPK tablet Take as instructed on packet   Pirfenidone (ESBRIET) 801 MG TABS Take 1 tablet by mouth 3 (three) times daily. (Patient taking differently: Take 801 mg by mouth 3 (three) times daily.)   polyethylene glycol powder (GLYCOLAX/MIRALAX) powder Take 17 g by mouth daily.   senna (SENOKOT) 8.6 MG TABS tablet Take 1 tablet by mouth daily as needed for mild constipation.   Vitamin D, Ergocalciferol, (DRISDOL) 1.25 MG (50000 UNIT) CAPS capsule Take 1 capsule (50,000 Units total) by mouth every 7 (seven) days for 12 doses.   XARELTO 20 MG TABS tablet TAKE ONE TABLET BY MOUTH ONCE DAILY WITH SUPPER   No facility-administered encounter medications on file as of 11/29/2020.  Reviewed chart prior to disease state call. Spoke with patient regarding BP  Recent Office Vitals: BP Readings from Last 3 Encounters:  11/21/20 (!) 150/84  09/18/20 132/76  09/13/20 (!) 147/58   Pulse Readings from Last 3 Encounters:  11/21/20 (!) 59  09/13/20 62  09/09/20 62    Wt Readings from Last 3 Encounters:  11/21/20 177 lb (80.3 kg)  09/11/20 177 lb (80.3 kg)  09/09/20 177 lb (80.3 kg)     Kidney Function Lab Results  Component Value Date/Time   CREATININE 0.93 09/09/2020 10:47 AM   CREATININE 0.99 02/05/2020 11:48 AM   CREATININE 0.93 12/01/2019 10:25 AM   GFR 75.22 10/12/2019 10:41 AM   GFRNONAA >60 09/09/2020 10:47 AM   GFRAA >60 11/04/2017 04:10 AM    BMP Latest Ref Rng & Units 09/09/2020 02/05/2020 12/01/2019  Glucose 70 - 99 mg/dL 86 95 85  BUN 8 - 23 mg/dL '17 16 16  '$ Creatinine 0.44 - 1.00 mg/dL 0.93 0.99 0.93  BUN/Creat Ratio 6 - 22 (calc) - - NOT APPLICABLE  Sodium A999333 - 145 mmol/L 137 138 136  Potassium 3.5 - 5.1 mmol/L 3.0(L) 3.2(L) 4.1  Chloride 98 - 111 mmol/L 98 99 99  CO2 22 - 32 mmol/L '28 27 23  '$ Calcium 8.9 - 10.3 mg/dL 9.7 9.8 10.0    Current antihypertensive regimen:  Amlodipine '10mg'$ , 1  tablet once daily Hydrochlorothiazide '25mg'$ , 1 tablet once daily Losartan '100mg'$ , 1 tablet once daily How often are you checking your Blood Pressure? Patient reports she is checking her blood pressures every morning. Current home BP readings: 9-16 126/61 (71) ; 9-15 137/66 (62) ; 9-14 148/69 (61) What recent interventions/DTPs have been made by any provider to improve Blood Pressure control since last CPP Visit: Patient reports no changes Any recent hospitalizations or ED visits since last visit with CPP? No What diet changes have been made to improve Blood Pressure Control?  Patient reports she continues to try and cut back on her salt and pork and has been including more vegetables. What exercise is being done to improve your Blood Pressure Control?  Patient reports she had surgery in June and is in a brace, she reports she has some stretching exercises she does at home but not back into her walking as of yet. She reports prior to her surgery she was  doing a little walking.   Adherence Review: Is the patient currently on ACE/ARB medication? Yes Does the patient have >5 day gap between last estimated fill dates? No   Recent Relevant Labs: Lab Results  Component Value Date/Time   HGBA1C 5.8 (A) 06/20/2020 11:16 AM   HGBA1C 6.0 (A) 05/16/2020 11:13 AM   HGBA1C 6.1 12/01/2019 10:25 AM   HGBA1C 6.0 02/02/2018 12:20 PM    Kidney Function Lab Results  Component Value Date/Time   CREATININE 0.93 09/09/2020 10:47 AM   CREATININE 0.99 02/05/2020 11:48 AM   CREATININE 0.93 12/01/2019 10:25 AM   GFR 75.22 10/12/2019 10:41 AM   GFRNONAA >60 09/09/2020 10:47 AM   GFRAA >60 11/04/2017 04:10 AM    Current antihyperglycemic regimen:  Metformin XR 500 mg 1 tablet before breakfast What recent interventions/DTPs have been made to improve glycemic control:  Patient reports no changes Have there been any recent hospitalizations or ED visits since last visit with CPP? None Patient reports  hypoglycemic symptoms, including Sweaty, Shaky, Hungry, Nervous/irritable, and Vision changes about 2 weeks ago she reports she went to bed and it was about 97 she had a snack and woke up feeling bad in the middle of night she checked and it was in the 70's, she reports she rechecked in 15 min and it was 54 she then she reports she followed her action plan and it had started to come back up. Patient reports hyperglycemic symptoms, including  shaking  She reports she had a reading also around the same week  and it was in the 200's she states she started drinking water and following other recommendations  and it came down. How often are you checking your blood sugar? Patient reports she had taken off her sensor during her surgery, but now is using sensor again. She reports her reading this week include 160 and 171  During the week, how often does your blood glucose drop below 70?  Every now and again  Adherence Review: Is the patient currently on a STATIN medication? Yes Is the patient currently on ACE/ARB medication? Yes Does the patient have >5 day gap between last estimated fill dates? No   Current Asthma regimen:  Albuterol HFA 161mg, act inhaler, inhale 1 puff every six hours as needed  Pirfenidone '801mg'$ , 1 tablet in the morning, at noon, and at bedtime Denies  recent Asthma symptoms, including Increased shortness of breath  and Wheezing Have you had exacerbation/flare-up since last visit? No What do you do when you are short of breath?  Rescue medication Patient reports she has not needed to use her inhalers recently doing well with asthma   Anxiety  -Current treatment: Alprazolam 0.'25mg'$ ,  1 tablet three times daily as needed for anxiety Patient reports she has been a little more anxious than usual since her surgery, she reports she has not needed to take her medication 3 times daily but has been taking it more regularly than before.   Notes: Patient expressed that she is in need of a  fill of her pain medication prescribed bu Dr JMartiniquebut recently filled by Dr ADeniece Ree She reports she wants the dosage of 5-325 Hydrocodone/Acetam as the last one called in was not touching her pain, message sent to MJeni SallesPharmacist for clarification on doses in chart, advised patient I will inform her when she may go and pick up or get back to her if I need more information after speaking with Pharmacist. Offered her a follow up  appointment with Pharmacist in December and she was in agreement. Call to pharmacy spoke to [facility] whom verified that the strength filled was 5-325 90 tabs and was picked up on 11-13-2020. Per [name] will advise patient to make an appointment with Dr [name] for her pain management if it is not working. Call to patient to advise of the above and she will call and make an appointment with Dr Jerilee Hoh.   Care Gaps: Eye Exam - Overdue Hepatitis C Screening - Overdue Zoster Vaccine - Overdue Foot Exam - Overdue COVID Booster #4 Therapist, music) - Overdue PNA Vaccine - Overdue AWV - Last 2021- MSG sent to Ramond Craver CMA to schedule. CCM FU - Dec 30 th 11:30  Star Rating Drugs: Metformin (Glucophage XR) 500 mg - Last filled 11-21-2020 30 DS at [name] Losartan (Cozaar) 100 mg - Last filled 11-21-2020 90 DS at [name] Atorvastatin (Lipitor) 80 mg - Last filled 11-21-2020 90 DS at [place] Pharmacist Assistant [phone]

## 2020-11-29 NOTE — Telephone Encounter (Signed)
LMTCB   From what I can tell patient had a spinal fusion back in June. Not sure if this is related.   PM do you have any knowledge of this being related to her Esbriet use? Thanks :)

## 2020-12-02 ENCOUNTER — Other Ambulatory Visit: Payer: Self-pay

## 2020-12-03 ENCOUNTER — Ambulatory Visit (INDEPENDENT_AMBULATORY_CARE_PROVIDER_SITE_OTHER): Payer: HMO | Admitting: Internal Medicine

## 2020-12-03 ENCOUNTER — Encounter: Payer: Self-pay | Admitting: Internal Medicine

## 2020-12-03 ENCOUNTER — Encounter: Payer: Self-pay | Admitting: Gastroenterology

## 2020-12-03 ENCOUNTER — Other Ambulatory Visit: Payer: Self-pay | Admitting: *Deleted

## 2020-12-03 VITALS — BP 120/70 | HR 69 | Temp 98.4°F | Wt 177.9 lb

## 2020-12-03 DIAGNOSIS — J849 Interstitial pulmonary disease, unspecified: Secondary | ICD-10-CM | POA: Diagnosis not present

## 2020-12-03 DIAGNOSIS — E1169 Type 2 diabetes mellitus with other specified complication: Secondary | ICD-10-CM

## 2020-12-03 DIAGNOSIS — Z23 Encounter for immunization: Secondary | ICD-10-CM

## 2020-12-03 DIAGNOSIS — I480 Paroxysmal atrial fibrillation: Secondary | ICD-10-CM | POA: Diagnosis not present

## 2020-12-03 DIAGNOSIS — M5412 Radiculopathy, cervical region: Secondary | ICD-10-CM

## 2020-12-03 LAB — POCT GLYCOSYLATED HEMOGLOBIN (HGB A1C): Hemoglobin A1C: 5.8 % — AB (ref 4.0–5.6)

## 2020-12-03 NOTE — Addendum Note (Signed)
Addended by: Westley Hummer B on: 12/03/2020 05:09 PM   Modules accepted: Orders

## 2020-12-03 NOTE — Progress Notes (Signed)
Established Patient Office Visit     This visit occurred during the SARS-CoV-2 public health emergency.  Safety protocols were in place, including screening questions prior to the visit, additional usage of staff PPE, and extensive cleaning of exam room while observing appropriate contact time as indicated for disinfecting solutions.    CC/Reason for Visit: 26-month follow-up chronic medical conditions  HPI: Miranda [name] is a 75 y.o. female who is coming in today for the above mentioned reasons. Past Medical History is significant for: Type 2 diabetes, hypertension, atrial fibrillation chronically anticoagulated on Xarelto, hyperlipidemia, idiopathic pulmonary fibrosis followed by pulmonary.  In July she had ACDF by neurosurgery for her cervical radiculopathy.  She has recovered well from the surgery.  She is requesting flu vaccine here for her order for pain medication refills.   Past Medical/Surgical History: Past Medical History:  Diagnosis Date   Anxiety    Arthritis    Asthma    Atrial fibrillation (HCC)    CHF (congestive heart failure) ([place])    pt. unsure- but thinks she was hosp. for CHF- 2002   [facility]   Clotting disorder ([place])    blood clots in lungs/PE pulmonary embolism   Colon polyps    Complication of anesthesia    states requires a lot med. to put her to sleep    DDD (degenerative disc disease) 09/17/2011   Depression    "sometimes "   DM (diabetes mellitus) ([place])    Family history of anesthesia complication    Sister had difficulty waking up from anesthesia   GERD (gastroesophageal reflux disease)    Glaucoma    bilateral, pt. admits that she is noncompliant to eye gtts.    Hemorrhoids    Hyperlipidemia    Hypertension    had stress, echo- 2006 /w [place], Cardiac Cath, per pt. 2002, echo repeated 2012- [facility]    IPF (idiopathic pulmonary fibrosis) (HCC)    Low back pain    Shortness of breath    Sleep apnea     uses c-pap- q night recently    Past Surgical History:  Procedure Laterality Date   ABDOMINAL HYSTERECTOMY     ectopic, fibroids   ANTERIOR CERVICAL DECOMP/DISCECTOMY FUSION N/A 09/11/2020   Procedure: Anterior Cervical Decompression Fusion Cervical four-five, Cervical five-six;  Surgeon: [name] Mare, MD;  Location: [facility];  Service: Neurosurgery;  Laterality: N/A;   arm surgery Right    CARDIAC CATHETERIZATION     CATARACT EXTRACTION, BILATERAL     cataracts removed bilateral- ?IOL   COLONOSCOPY     remote   FISSURECTOMY  10/08/2011   Procedure: FISSURECTOMY;  Surgeon: Stark Klein, MD;  Location: [facility];  Service: General;  Laterality: N/A;   FLEXIBLE SIGMOIDOSCOPY  02/25/2011   Procedure: FLEXIBLE SIGMOIDOSCOPY;  Surgeon: Inda Castle, MD;  Location: [facility];  Service: Endoscopy;  Laterality: N/A;   FOOT SURGERY     bilat, heel spurs- screw in R foot    HEMORRHOID SURGERY  10/08/2011   Procedure: HEMORRHOIDECTOMY;  Surgeon: Stark Klein, MD;  Location: [facility];  Service: General;  Laterality: N/A;  External    ROTATOR CUFF REPAIR Right    SPHINCTEROTOMY  10/08/2011   Procedure: Joan Mayans;  Surgeon: Stark Klein, MD;  Location: [facility];  Service: General;  Laterality: N/A;    Social History:  reports that she has never smoked. She has never used smokeless tobacco. She reports  that she does not drink alcohol and does not use drugs.  Allergies: Allergies  Allergen Reactions   Fish Oil Anaphylaxis   Other Hives, Shortness Of Breath and Swelling    Allergic to cashew nuts and peanut oil.   Penicillins Anaphylaxis, Hives and Swelling    Has patient had a PCN reaction causing immediate rash, facial/tongue/throat swelling, SOB or lightheadedness with hypotension: Face swelling and hives started first, then swelling of the throat  Has patient had a PCN reaction causing severe rash involving mucus membranes or skin necrosis: Yes  Has patient had a PCN reaction that  required hospitalization: No  Has patient had a PCN reaction occurring within the last 10 years: Yes  If all of the above answers are "NO", then may proceed with Cephalospor   Pneumococcal Vaccines Nausea And Vomiting   Cashew Nut Oil    Nitrofurantoin Macrocrystal Hives   Peanut Oil    Aspirin Itching and Rash   Bactrim [Sulfamethoxazole-Trimethoprim] Hives, Itching and Rash   Ciprofloxacin Hives, Itching and Rash   Ibuprofen Rash   Influenza Vaccines Hives   Ivp Dye [Iodinated Diagnostic Agents] Hives, Itching and Rash    Gives benadryl to counteract symptoms   Latex Rash   Macrobid [Nitrofurantoin Monohydrate Macrocrystals] Hives   Shellfish Allergy Hives    Patient also allergic to seafood    Family History:  Family History  Problem Relation Age of Onset   Heart attack Mother    Heart disease Mother    Breast cancer Mother    Emphysema Sister    Breast cancer Sister    Arthritis/Rheumatoid Sister    Asthma Sister    Lung cancer Sister    COPD Sister    Colon cancer Brother    Colon cancer Brother    Anesthesia problems Neg Hx    Esophageal cancer Neg Hx    Rectal cancer Neg Hx    Stomach cancer Neg Hx      Current Outpatient Medications:    Accu-Chek Softclix Lancets lancets, Use as instructed, Disp: 100 each, Rfl: 12   acetaminophen (TYLENOL) 325 MG tablet, Take 650 mg by mouth every 6 (six) hours as needed for moderate pain., Disp: , Rfl:    albuterol (VENTOLIN HFA) 108 (90 Base) MCG/ACT inhaler, Inhale 1 puff into the lungs every 6 (six) hours as needed. (Patient taking differently: Inhale 1 puff into the lungs every 6 (six) hours as needed for shortness of breath or wheezing.), Disp: 18 g, Rfl: 1   ALPRAZolam (XANAX) 0.25 MG tablet, TAKE ONE TABLET BY MOUTH THREE times daily AS NEEDED FOR ANXIETY, Disp: 90 tablet, Rfl: 0   amLODipine (NORVASC) 10 MG tablet, Take 1 tablet (10 mg total) by mouth daily., Disp: 90 tablet, Rfl: 1   atorvastatin (LIPITOR) 80 MG  tablet, TAKE ONE TABLET BY MOUTH EVERY EVENING, Disp: 90 tablet, Rfl: 1   Continuous Blood Gluc Receiver (FREESTYLE LIBRE 14 DAY READER) DEVI, 1 each by Does not apply route daily., Disp: 1 each, Rfl: 2   Continuous Blood Gluc Sensor (FREESTYLE LIBRE 14 DAY SENSOR) MISC, USE 1  ONCE DAILY, Disp: 2 each, Rfl: 5   cyclobenzaprine (FLEXERIL) 5 MG tablet, TAKE ONE TABLET BY MOUTH EVERY EVENING, Disp: 90 tablet, Rfl: 1   diclofenac sodium (VOLTAREN) 1 % GEL, Apply 2 g topically 4 (four) times daily. (Patient taking differently: Apply 2 g topically daily as needed (pain).), Disp: 1 Tube, Rfl: 1   docusate sodium (COLACE) 100 MG  capsule, Take 1 capsule (100 mg total) by mouth 2 (two) times daily., Disp: 60 capsule, Rfl: 2   dronedarone (MULTAQ) 400 MG tablet, TAKE 1 TABLET BY MOUTH TWICE DAILY WITH A MEAL (Patient taking differently: Take 400 mg by mouth 2 (two) times daily with a meal.), Disp: 180 tablet, Rfl: 3   ezetimibe (ZETIA) 10 MG tablet, Take 1 tablet (10 mg total) by mouth daily., Disp: 90 tablet, Rfl: 1   furosemide (LASIX) 40 MG tablet, Take 1/2 (one-half) tablet by mouth once daily (Patient taking differently: Take 20 mg by mouth daily.), Disp: 45 tablet, Rfl: 1   glucose blood (ACCU-CHEK AVIVA PLUS) test strip, Use once daily for glucose control.  Dx E11.9  For accu-chek aviva, Disp: 100 each, Rfl: 12   glucose blood (ACCU-CHEK GUIDE) test strip, Use as instructed, Disp: 100 each, Rfl: 12   hydrochlorothiazide (HYDRODIURIL) 25 MG tablet, TAKE ONE TABLET BY MOUTH ONCE DAILY, Disp: 90 tablet, Rfl: 1   HYDROcodone-acetaminophen (NORCO/VICODIN) 5-325 MG tablet, Take 1-2 tablets by mouth every 6 (six) hours as needed for severe pain or moderate pain ((score 7 to 10))., Disp: 60 tablet, Rfl: 0   lactulose (CONSTULOSE) 10 GM/15ML solution, TAKE 30 MILLILITERS BY MOUTH  THREE TIMES DAILY AS NEEDED FOR MILD CONSTIPATION (Patient taking differently: Take 20 g by mouth 3 (three) times daily as needed for  moderate constipation.), Disp: 473 mL, Rfl: 1   lidocaine (LIDODERM) 5 %, Place 1 patch onto the skin daily. Remove & Discard patch within 12 hours or as directed by MD, Disp: 15 patch, Rfl: 0   linaclotide (LINZESS) 72 MCG capsule, Take 72 mcg by mouth daily before breakfast., Disp: , Rfl:    losartan (COZAAR) 100 MG tablet, TAKE ONE TABLET BY MOUTH EVERY EVENING, Disp: 90 tablet, Rfl: 2   metFORMIN (GLUCOPHAGE-XR) 500 MG 24 hr tablet, Take 1 tablet (500 mg total) by mouth daily with breakfast., Disp: 90 tablet, Rfl: 1   Pirfenidone (ESBRIET) 801 MG TABS, Take 1 tablet by mouth 3 (three) times daily. (Patient taking differently: Take 801 mg by mouth 3 (three) times daily.), Disp: 90 tablet, Rfl: 10   polyethylene glycol powder (GLYCOLAX/MIRALAX) powder, Take 17 g by mouth daily., Disp: 3350 g, Rfl: 1   senna (SENOKOT) 8.6 MG TABS tablet, Take 1 tablet by mouth daily as needed for mild constipation., Disp: , Rfl:    Vitamin D, Ergocalciferol, (DRISDOL) 1.25 MG (50000 UNIT) CAPS capsule, Take 1 capsule (50,000 Units total) by mouth every 7 (seven) days for 12 doses., Disp: 12 capsule, Rfl: 0   XARELTO 20 MG TABS tablet, TAKE ONE TABLET BY MOUTH ONCE DAILY WITH SUPPER, Disp: 90 tablet, Rfl: 1  Review of Systems:  Constitutional: Denies fever, chills, diaphoresis, appetite change and fatigue.  HEENT: Denies photophobia, eye pain, redness, hearing loss, ear pain, congestion, sore throat, rhinorrhea, sneezing, mouth sores, trouble swallowing, neck pain, neck stiffness and tinnitus.   Respiratory: Denies SOB, DOE, cough, chest tightness,  and wheezing.   Cardiovascular: Denies chest pain, palpitations and leg swelling.  Gastrointestinal: Denies nausea, vomiting, abdominal pain, diarrhea, constipation, blood in stool and abdominal distention.  Genitourinary: Denies dysuria, urgency, frequency, hematuria, flank pain and difficulty urinating.  Endocrine: Denies: hot or cold intolerance, sweats, changes in  hair or nails, polyuria, polydipsia. Musculoskeletal: Positive for myalgias, back pain, joint swelling, arthralgias and gait problem.  Skin: Denies pallor, rash and wound.  Neurological: Denies dizziness, seizures, syncope, weakness, light-headedness, numbness and headaches.  Hematological: Denies adenopathy. Easy bruising, personal or family bleeding history  Psychiatric/Behavioral: Denies suicidal ideation, mood changes, confusion, nervousness, sleep disturbance and agitation    Physical Exam: Vitals:   12/03/20 1453  BP: 120/70  Pulse: 69  Temp: 98.4 F (36.9 C)  TempSrc: Oral  SpO2: 99%  Weight: 177 lb 14.4 oz (80.7 kg)    Body mass index is 28.71 kg/m.   Constitutional: NAD, calm, comfortable Eyes: PERRL, lids and conjunctivae normal ENMT: Mucous membranes are moist.  Respiratory: clear to auscultation bilaterally, no wheezing, no crackles. Normal respiratory effort. No accessory muscle use.  Cardiovascular: Regular rate and rhythm, no murmurs / rubs / gallops. No extremity edema.  Psychiatric: Normal judgment and insight. Alert and oriented x 3. Normal mood.    Impression and Plan:  Type 2 diabetes mellitus with other specified complication, without long-term current use of insulin ([place])  - Plan: POCT glycosylated hemoglobin (Hb A1C)  Cervical radiculopathy  ILD (interstitial lung disease) (HCC)  Paroxysmal atrial fibrillation ([place])  Need for influenza vaccination  -Charts have been reviewed, in particular as it refers to her recent cervical surgery.  She has received flu vaccine.  A1c is well controlled at 5.8. -PDMP has been reviewed, she has no red flags and an overdose risk score of 180.  Time spent: 32 minutes reviewing chart, interviewing and examining patient and formulating plan of care.   Patient Instructions  -Nice seeing you today!!  -Schedule follow up in 3 months.    [name], MD [facility] at [facility]

## 2020-12-03 NOTE — Telephone Encounter (Signed)
The spinal surgery is likely not related to esbriet

## 2020-12-03 NOTE — Telephone Encounter (Signed)
LMTCB for [place]

## 2020-12-03 NOTE — Patient Instructions (Signed)
-  Nice seeing you today!!  -Schedule follow up in 3 months. 

## 2020-12-04 MED ORDER — HYDROCODONE-ACETAMINOPHEN 5-325 MG PO TABS: 1.0000 | ORAL_TABLET | Freq: Four times a day (QID) | ORAL | 0 refills | Status: DC | PRN

## 2020-12-06 ENCOUNTER — Telehealth: Payer: Self-pay | Admitting: Internal Medicine

## 2020-12-06 MED ORDER — FREESTYLE LIBRE 14 DAY SENSOR MISC: 5 refills | Status: DC

## 2020-12-06 NOTE — Telephone Encounter (Signed)
PT called to request a refill of their Continuous Blood Gluc Sensor (FREESTYLE LIBRE 14 DAY SENSOR) MISC called into the [facility] on file. Please advise.

## 2020-12-06 NOTE — Telephone Encounter (Signed)
Refill sent.

## 2020-12-06 NOTE — Addendum Note (Signed)
Addended by: Westley Hummer B on: 12/06/2020 01:53 PM   Modules accepted: Orders

## 2020-12-12 ENCOUNTER — Telehealth: Payer: Self-pay | Admitting: Gastroenterology

## 2020-12-12 NOTE — Telephone Encounter (Signed)
Inbound call from patient insurance [employer]. Patient was given Linzess samples and currently out. Patient states it has been helping and would like more but can't afford the cost of the medication. Best contact [phone]

## 2020-12-13 NOTE — Telephone Encounter (Signed)
She can be given samples of Linzess when we have some.  I do not have another prescription medicine for constipation to offer that is likely to be less expensive.  Perhaps trying another pharmacy or using a GoodRx card may lower the cost of Linzess.  - HD

## 2020-12-16 ENCOUNTER — Other Ambulatory Visit: Payer: Self-pay

## 2020-12-16 MED ORDER — LINACLOTIDE 145 MCG PO CAPS: 145.0000 ug | ORAL_CAPSULE | Freq: Every day | ORAL | 2 refills | Status: DC

## 2020-12-16 NOTE — Telephone Encounter (Signed)
Patient was calling to have [place] place an order for the Linzess 138mcg as she did well with the samples. She will let us know if her cost is an issue

## 2020-12-17 ENCOUNTER — Telehealth: Payer: Self-pay | Admitting: Pharmacist

## 2020-12-17 NOTE — Chronic Care Management (AMB) (Signed)
Chronic Care Management Pharmacy Assistant   Name: Miranda [name]  MRN: [id number] DOB: [birth year]  Reason for Encounter: Medication Review/ Medication Coordination & General Assessment   Conditions to be addressed/monitored: HTN and DMII  Recent office visits:  12-03-2020 Miranda [name], Miranda Halsted, MD - Patient presented for Type 2 diabetes mellitus with other specified complication, without long-term current use of insulin and other concerns. Stopped Methylprednisolone.  Recent consult visits:  None   Hospital visits:  Medication Reconciliation was completed by comparing discharge summary, patient's EMR and Pharmacy list, and upon discussion with patient.   Patient presented to [facility] on 09-11-2020 for elective surgery & Cervical radiculopathy Discharge date was 09-13-2020    New?Medications Started at [facility] Discharge:?? -started  docusate sodium  methylPREDNISolone    Medication Changes at Hospital Discharge: -Changed  HYDROcodone-acetaminophen   Medications Discontinued at Hospital Discharge: -Stopped  rivaroxaban 20 MG   Medications that remain the same after Hospital Discharge:??  -All other medications will remain the same.      Medications: Outpatient Encounter Medications as of 12/17/2020  Medication Sig   Accu-Chek Softclix Lancets lancets Use as instructed   acetaminophen (TYLENOL) 325 MG tablet Take 650 mg by mouth every 6 (six) hours as needed for moderate pain.   albuterol (VENTOLIN HFA) 108 (90 Base) MCG/ACT inhaler Inhale 1 puff into the lungs every 6 (six) hours as needed. (Patient taking differently: Inhale 1 puff into the lungs every 6 (six) hours as needed for shortness of breath or wheezing.)   ALPRAZolam (XANAX) 0.25 MG tablet TAKE ONE TABLET BY MOUTH THREE times daily AS NEEDED FOR ANXIETY   amLODipine (NORVASC) 10 MG tablet Take 1 tablet (10 mg total) by mouth daily.   atorvastatin (LIPITOR) 80 MG tablet TAKE ONE TABLET  BY MOUTH EVERY EVENING   Continuous Blood Gluc Receiver (FREESTYLE LIBRE 14 DAY READER) DEVI 1 each by Does not apply route daily.   Continuous Blood Gluc Sensor (FREESTYLE LIBRE 14 DAY SENSOR) MISC USE 1  ONCE DAILY   cyclobenzaprine (FLEXERIL) 5 MG tablet TAKE ONE TABLET BY MOUTH EVERY EVENING   diclofenac sodium (VOLTAREN) 1 % GEL Apply 2 g topically 4 (four) times daily. (Patient taking differently: Apply 2 g topically daily as needed (pain).)   docusate sodium (COLACE) 100 MG capsule Take 1 capsule (100 mg total) by mouth 2 (two) times daily.   dronedarone (MULTAQ) 400 MG tablet TAKE 1 TABLET BY MOUTH TWICE DAILY WITH A MEAL (Patient taking differently: Take 400 mg by mouth 2 (two) times daily with a meal.)   ezetimibe (ZETIA) 10 MG tablet Take 1 tablet (10 mg total) by mouth daily.   furosemide (LASIX) 40 MG tablet Take 1/2 (one-half) tablet by mouth once daily (Patient taking differently: Take 20 mg by mouth daily.)   glucose blood (ACCU-CHEK AVIVA PLUS) test strip Use once daily for glucose control.  Dx E11.9  For accu-chek aviva   glucose blood (ACCU-CHEK GUIDE) test strip Use as instructed   hydrochlorothiazide (HYDRODIURIL) 25 MG tablet TAKE ONE TABLET BY MOUTH ONCE DAILY   HYDROcodone-acetaminophen (NORCO/VICODIN) 5-325 MG tablet Take 1-2 tablets by mouth every 6 (six) hours as needed for severe pain or moderate pain ((score 7 to 10)).   HYDROcodone-acetaminophen (NORCO/VICODIN) 5-325 MG tablet Take 1 tablet by mouth every 6 (six) hours as needed for moderate pain.   HYDROcodone-acetaminophen (NORCO/VICODIN) 5-325 MG tablet Take 1 tablet by mouth every 6 (six) hours as needed for  moderate pain.   lactulose (CONSTULOSE) 10 GM/15ML solution TAKE 30 MILLILITERS BY MOUTH  THREE TIMES DAILY AS NEEDED FOR MILD CONSTIPATION (Patient taking differently: Take 20 g by mouth 3 (three) times daily as needed for moderate constipation.)   lidocaine (LIDODERM) 5 % Place 1 patch onto the skin daily.  Remove & Discard patch within 12 hours or as directed by MD   linaclotide (LINZESS) 145 MCG CAPS capsule Take 1 capsule (145 mcg total) by mouth daily before breakfast.   losartan (COZAAR) 100 MG tablet TAKE ONE TABLET BY MOUTH EVERY EVENING   metFORMIN (GLUCOPHAGE-XR) 500 MG 24 hr tablet Take 1 tablet (500 mg total) by mouth daily with breakfast.   Pirfenidone (ESBRIET) 801 MG TABS Take 1 tablet by mouth 3 (three) times daily. (Patient taking differently: Take 801 mg by mouth 3 (three) times daily.)   polyethylene glycol powder (GLYCOLAX/MIRALAX) powder Take 17 g by mouth daily.   senna (SENOKOT) 8.6 MG TABS tablet Take 1 tablet by mouth daily as needed for mild constipation.   XARELTO 20 MG TABS tablet TAKE ONE TABLET BY MOUTH ONCE DAILY WITH SUPPER   No facility-administered encounter medications on file as of 12/17/2020.  Reviewed chart prior to disease state call. Spoke with patient regarding BP  Recent Office Vitals: BP Readings from Last 3 Encounters:  12/03/20 120/70  11/21/20 (!) 150/84  09/18/20 132/76   Pulse Readings from Last 3 Encounters:  12/03/20 69  11/21/20 (!) 59  09/13/20 62    Wt Readings from Last 3 Encounters:  12/03/20 177 lb 14.4 oz (80.7 kg)  11/21/20 177 lb (80.3 kg)  09/11/20 177 lb (80.3 kg)     Kidney Function Lab Results  Component Value Date/Time   CREATININE 0.93 09/09/2020 10:47 AM   CREATININE 0.99 02/05/2020 11:48 AM   CREATININE 0.93 12/01/2019 10:25 AM   GFR 75.22 10/12/2019 10:41 AM   GFRNONAA >60 09/09/2020 10:47 AM   GFRAA >60 11/04/2017 04:10 AM    BMP Latest Ref Rng & Units 09/09/2020 02/05/2020 12/01/2019  Glucose 70 - 99 mg/dL 86 95 85  BUN 8 - 23 mg/dL 17 16 16   Creatinine 0.44 - 1.00 mg/dL 0.93 0.99 0.93  BUN/Creat Ratio 6 - 22 (calc) - - NOT APPLICABLE  Sodium 053 - 145 mmol/L 137 138 136  Potassium 3.5 - 5.1 mmol/L 3.0(L) 3.2(L) 4.1  Chloride 98 - 111 mmol/L 98 99 99  CO2 22 - 32 mmol/L 28 27 23   Calcium 8.9 - 10.3 mg/dL  9.7 9.8 10.0    Current antihypertensive regimen:  Amlodipine 10mg , 1 tablet once daily Hydrochlorothiazide 25mg , 1 tablet once daily Losartan 100mg , 1 tablet once daily How often are you checking your Blood Pressure? weekly Patient reports her blood pressures have been ok she reports she is going through a stressful time. Her granddaughter has been missing and she has been concerned about her.  Adherence Review: Is the patient currently on ACE/ARB medication? Yes Does the patient have >5 day gap between last estimated fill dates? No    Recent Relevant Labs: Lab Results  Component Value Date/Time   HGBA1C 5.8 (A) 12/03/2020 03:05 PM   HGBA1C 5.8 (A) 06/20/2020 11:16 AM   HGBA1C 6.1 12/01/2019 10:25 AM   HGBA1C 6.0 02/02/2018 12:20 PM    Kidney Function Lab Results  Component Value Date/Time   CREATININE 0.93 09/09/2020 10:47 AM   CREATININE 0.99 02/05/2020 11:48 AM   CREATININE 0.93 12/01/2019 10:25 AM   GFR  75.22 10/12/2019 10:41 AM   GFRNONAA >60 09/09/2020 10:47 AM   GFRAA >60 11/04/2017 04:10 AM    Current antihyperglycemic regimen:  Metformin XR 500 mg 1 tablet before breakfast Have there been any recent hospitalizations or ED visits since last visit with CPP? No Patient denies hypoglycemic symptoms, including Pale, Sweaty, Shaky, Hungry, Nervous/irritable, and Vision changes Patient denies hyperglycemic symptoms, including blurry vision, excessive thirst, fatigue, polyuria, and weakness What are your blood sugars ranging?  The highest she has had the past few days after eating is 176 has not fallen bellow 71 During the week, how often does your blood glucose drop below 70? Every now and again  Adherence Review: Is the patient currently on a STATIN medication? Yes Is the patient currently on ACE/ARB medication? Yes Does the patient have >5 day gap between last estimated fill dates? No   Reviewed chart for medication changes ahead of medication coordination  call.  No OVs, Consults, or hospital visits since last care coordination call/Pharmacist visit.No medication changes indicated BP Readings from Last 3 Encounters:  12/03/20 120/70  11/21/20 (!) 150/84  09/18/20 132/76    Lab Results  Component Value Date   HGBA1C 5.8 (A) 12/03/2020     Patient obtains medications through Adherence Packaging  30 Days   Last adherence delivery included: Not available in chart  Patient is due for next adherence delivery on: 12-26-2020. Called patient and reviewed medications and coordinated delivery. Confirmed packaging for 30 DS   This delivery to include: Furosemide (Lasix) 20 mg : Take one at Breakfast Cyclobenzaprine (Flexiril)  5 mg : take one at evening meal Atorvastatin (Lipitor) 80 mg : take one at evening meal Hydrochlorothiazide 25 mg : take one at breakfast Xarelto 20 mg : take one at evening meal Losartan Potassium (Cozaar) 100 mg :  take one at evening meal Dronedarone (Multaq) 400 mg : take one at lunch and one at evening meal Amlodipine 10 mg : take one at breakfast Ezetimbe (Zetia) 10 mg : take one at breakfast Metformin (Glucohage XR)  500 mg: take one at breakfast Docusate Sodium (Colace)100 mg : take one at breakfast and one at evening meal   Patient needs refills for  [employer] (2 boxes 14 DS each)  Confirmed delivery date of 12-26-2020, advised patient that pharmacy will contact them the morning of delivery.   Care Gaps: CCM FU -04-21-21 Eye Exam - Overdue Hepatitis C Screening - Overdue Zoster Vaccine - Overdue Foot Exam - Overdue COVID Booster #4 [employer]) - Overdue AWV - Last 2021- MSG sent to Ramond Craver CMA to schedule.  BP- 120/70 A1C - 5.8  Star Rating Drugs: Metformin (Glucophage XR) 500 mg - Last filled 11-21-2020 30 DS at [name] Losartan (Cozaar) 100 mg - Last filled 11-21-2020 30 DS at [name] Atorvastatin (Lipitor) 80 mg - Last filled 11-21-2020 30 DS at [name]

## 2020-12-19 DIAGNOSIS — M5415 Radiculopathy, thoracolumbar region: Secondary | ICD-10-CM | POA: Diagnosis not present

## 2020-12-19 DIAGNOSIS — M5412 Radiculopathy, cervical region: Secondary | ICD-10-CM | POA: Diagnosis not present

## 2020-12-19 DIAGNOSIS — M5136 Other intervertebral disc degeneration, lumbar region: Secondary | ICD-10-CM | POA: Diagnosis not present

## 2020-12-24 DIAGNOSIS — M5416 Radiculopathy, lumbar region: Secondary | ICD-10-CM | POA: Diagnosis not present

## 2020-12-24 NOTE — Progress Notes (Signed)
Call to Dr Manon Hilding office to request prescription for colace be sent to [facility], left msg with his assistant carly, pharmacy advised  [place] Clinical Pharmacist Assistant [phone]

## 2021-01-10 ENCOUNTER — Other Ambulatory Visit: Payer: Self-pay | Admitting: Internal Medicine

## 2021-01-10 DIAGNOSIS — I10 Essential (primary) hypertension: Secondary | ICD-10-CM

## 2021-01-13 ENCOUNTER — Telehealth: Payer: Self-pay | Admitting: Pharmacist

## 2021-01-13 NOTE — Chronic Care Management (AMB) (Signed)
Chronic Care Management Pharmacy Assistant   Name: Miranda [name]  MRN: [id number] DOB: 1945-05-08  Reason for Encounter: Medication Review   Recent consult visits:  12/24/20 [name] (Physical Med) - Patient resented for Lumbar radiculopathy. No other details available.  12/19/20 [name] (Physical Med) - Patient resented for Cervical radiculopathy and other concerns. No other details available.  Hospital visits:  Medication Reconciliation was completed by comparing discharge summary, patient's EMR and Pharmacy list, and upon discussion with patient.   Patient presented to [facility] on 09-11-2020 for elective surgery & Cervical radiculopathy Discharge date was 09-13-2020    New?Medications Started at [facility] Discharge:?? -started  docusate sodium  methylPREDNISolone    Medication Changes at Hospital Discharge: -Changed  HYDROcodone-acetaminophen   Medications Discontinued at Hospital Discharge: -Stopped  rivaroxaban 20 MG   Medications that remain the same after Hospital Discharge:??  -All other medications will remain the same.        Medications: Outpatient Encounter Medications as of 01/13/2021  Medication Sig   Accu-Chek Softclix Lancets lancets Use as instructed   acetaminophen (TYLENOL) 325 MG tablet Take 650 mg by mouth every 6 (six) hours as needed for moderate pain.   albuterol (VENTOLIN HFA) 108 (90 Base) MCG/ACT inhaler Inhale 1 puff into the lungs every 6 (six) hours as needed. (Patient taking differently: Inhale 1 puff into the lungs every 6 (six) hours as needed for shortness of breath or wheezing.)   ALPRAZolam (XANAX) 0.25 MG tablet TAKE ONE TABLET BY MOUTH THREE times daily AS NEEDED FOR ANXIETY   amLODipine (NORVASC) 10 MG tablet TAKE ONE TABLET BY MOUTH ONCE DAILY   atorvastatin (LIPITOR) 80 MG tablet TAKE ONE TABLET BY MOUTH EVERY EVENING   Continuous Blood Gluc Receiver (FREESTYLE LIBRE 14 DAY READER) DEVI 1 each by  Does not apply route daily.   Continuous Blood Gluc Sensor (FREESTYLE LIBRE 14 DAY SENSOR) MISC USE 1  ONCE DAILY   cyclobenzaprine (FLEXERIL) 5 MG tablet TAKE ONE TABLET BY MOUTH EVERY EVENING   diclofenac sodium (VOLTAREN) 1 % GEL Apply 2 g topically 4 (four) times daily. (Patient taking differently: Apply 2 g topically daily as needed (pain).)   docusate sodium (COLACE) 100 MG capsule Take 1 capsule (100 mg total) by mouth 2 (two) times daily.   dronedarone (MULTAQ) 400 MG tablet TAKE 1 TABLET BY MOUTH TWICE DAILY WITH A MEAL (Patient taking differently: Take 400 mg by mouth 2 (two) times daily with a meal.)   ezetimibe (ZETIA) 10 MG tablet Take 1 tablet (10 mg total) by mouth daily.   furosemide (LASIX) 40 MG tablet Take 1/2 (one-half) tablet by mouth once daily (Patient taking differently: Take 20 mg by mouth daily.)   glucose blood (ACCU-CHEK AVIVA PLUS) test strip Use once daily for glucose control.  Dx E11.9  For accu-chek aviva   glucose blood (ACCU-CHEK GUIDE) test strip Use as instructed   hydrochlorothiazide (HYDRODIURIL) 25 MG tablet TAKE ONE TABLET BY MOUTH ONCE DAILY   HYDROcodone-acetaminophen (NORCO/VICODIN) 5-325 MG tablet Take 1-2 tablets by mouth every 6 (six) hours as needed for severe pain or moderate pain ((score 7 to 10)).   HYDROcodone-acetaminophen (NORCO/VICODIN) 5-325 MG tablet Take 1 tablet by mouth every 6 (six) hours as needed for moderate pain.   HYDROcodone-acetaminophen (NORCO/VICODIN) 5-325 MG tablet Take 1 tablet by mouth every 6 (six) hours as needed for moderate pain.   lactulose (CONSTULOSE) 10 GM/15ML solution TAKE 30 MILLILITERS BY MOUTH  THREE TIMES DAILY AS NEEDED FOR MILD CONSTIPATION (Patient taking differently: Take 20 g by mouth 3 (three) times daily as needed for moderate constipation.)   lidocaine (LIDODERM) 5 % Place 1 patch onto the skin daily. Remove & Discard patch within 12 hours or as directed by MD   linaclotide (LINZESS) 145 MCG CAPS capsule  Take 1 capsule (145 mcg total) by mouth daily before breakfast.   losartan (COZAAR) 100 MG tablet TAKE ONE TABLET BY MOUTH EVERY EVENING   metFORMIN (GLUCOPHAGE-XR) 500 MG 24 hr tablet TAKE ONE TABLET BY MOUTH EVERY MORNING   Pirfenidone (ESBRIET) 801 MG TABS Take 1 tablet by mouth 3 (three) times daily. (Patient taking differently: Take 801 mg by mouth 3 (three) times daily.)   polyethylene glycol powder (GLYCOLAX/MIRALAX) powder Take 17 g by mouth daily.   senna (SENOKOT) 8.6 MG TABS tablet Take 1 tablet by mouth daily as needed for mild constipation.   XARELTO 20 MG TABS tablet TAKE ONE TABLET BY MOUTH ONCE DAILY WITH SUPPER   No facility-administered encounter medications on file as of 01/13/2021.  Reviewed chart for medication changes ahead of medication coordination call.  No OVs, Consults, or hospital visits since last care coordination call/Pharmacist visit.  No medication changes indicated BP Readings from Last 3 Encounters:  12/03/20 120/70  11/21/20 (!) 150/84  09/18/20 132/76    Lab Results  Component Value Date   HGBA1C 5.8 (A) 12/03/2020     Patient obtains medications through Adherence Packaging  30 Days   Last adherence delivery included:  Furosemide (Lasix) 20 mg : Take one at Breakfast Cyclobenzaprine (Flexiril)  5 mg : take one at evening meal Atorvastatin (Lipitor) 80 mg : take one at evening meal Hydrochlorothiazide 25 mg : take one at breakfast Xarelto 20 mg : take one at evening meal Losartan Potassium (Cozaar) 100 mg :  take one at evening meal Dronedarone (Multaq) 400 mg : take one at lunch and one at evening meal Amlodipine 10 mg : take one at breakfast Ezetimbe (Zetia) 10 mg : take one at breakfast Metformin (Glucohage XR)  500 mg: take one at breakfast Docusate Sodium (Colace)100 mg : take one at breakfast and one at evening meal  Patient needs refills for  [employer] (2 boxes 14 DS each)   Patient is due for next adherence delivery on:  01/23/21. Called patient and reviewed medications and coordinated delivery. Confirmed packaging for 30 DS  This delivery to include: Furosemide (Lasix) 20 mg : Take one at Breakfast Atorvastatin (Lipitor) 80 mg : take one at evening meal Hydrochlorothiazide 25 mg : take one at breakfast Xarelto 20 mg : take one at evening meal Losartan Potassium (Cozaar) 100 mg :  take one at evening meal Dronedarone (Multaq) 400 mg : take one at lunch and one at evening meal Amlodipine 10 mg : take one at breakfast Ezetimbe (Zetia) 10 mg : take one at breakfast Metformin (Glucohage XR)  500 mg: take one at breakfast Cyclobenzaprine (Flexiril)  5 mg : take one at evening meal Freestyle [employer] (2 boxes 14 DS each) ** patient started on Linzess this month, call to prescriber next month for [facility]  Confirmed delivery date of 01/23/21, advised patient that pharmacy will contact her the morning of delivery.   Reviewed chart prior to disease state call. Spoke with patient regarding BP  Recent Office Vitals: BP Readings from Last 3 Encounters:  12/03/20 120/70  11/21/20 (!) 150/84  09/18/20 132/76   Pulse Readings from Last 3 Encounters:  12/03/20 69  11/21/20 (!) 59  09/13/20 62    Wt Readings from Last 3 Encounters:  12/03/20 177 lb 14.4 oz (80.7 kg)  11/21/20 177 lb (80.3 kg)  09/11/20 177 lb (80.3 kg)     Kidney Function Lab Results  Component Value Date/Time   CREATININE 0.93 09/09/2020 10:47 AM   CREATININE 0.99 02/05/2020 11:48 AM   CREATININE 0.93 12/01/2019 10:25 AM   GFR 75.22 10/12/2019 10:41 AM   GFRNONAA >60 09/09/2020 10:47 AM   GFRAA >60 11/04/2017 04:10 AM    BMP Latest Ref Rng & Units 09/09/2020 02/05/2020 12/01/2019  Glucose 70 - 99 mg/dL 86 95 85  BUN 8 - 23 mg/dL 17 16 16   Creatinine 0.44 - 1.00 mg/dL 0.93 0.99 0.93  BUN/Creat Ratio 6 - 22 (calc) - - NOT APPLICABLE  Sodium 761 - 145 mmol/L 137 138 136  Potassium 3.5 - 5.1 mmol/L  3.0(L) 3.2(L) 4.1  Chloride 98 - 111 mmol/L 98 99 99  CO2 22 - 32 mmol/L 28 27 23   Calcium 8.9 - 10.3 mg/dL 9.7 9.8 10.0    Current antihypertensive regimen:  Amlodipine 10mg , 1 tablet once daily Hydrochlorothiazide 25mg , 1 tablet once daily Losartan 100mg , 1 tablet once daily How often are you checking your Blood Pressure? infrequently Current home BP readings: Most recent in office appointment with specialist 164/84, patient reports home readings  of 172/69 and 135/60 she reports she has been having some headaches. Advised to check at least once a week and keep a log of the readings. What recent interventions/DTPs have been made by any provider to improve Blood Pressure control since last CPP Visit: Patient reports none Any recent hospitalizations or ED visits since last visit with CPP? No  Adherence Review: Is the patient currently on ACE/ARB medication? Yes Does the patient have >5 day gap between last estimated fill dates? No  Recent Relevant Labs: Lab Results  Component Value Date/Time   HGBA1C 5.8 (A) 12/03/2020 03:05 PM   HGBA1C 5.8 (A) 06/20/2020 11:16 AM   HGBA1C 6.1 12/01/2019 10:25 AM   HGBA1C 6.0 02/02/2018 12:20 PM    Kidney Function Lab Results  Component Value Date/Time   CREATININE 0.93 09/09/2020 10:47 AM   CREATININE 0.99 02/05/2020 11:48 AM   CREATININE 0.93 12/01/2019 10:25 AM   GFR 75.22 10/12/2019 10:41 AM   GFRNONAA >60 09/09/2020 10:47 AM   GFRAA >60 11/04/2017 04:10 AM    Current antihyperglycemic regimen:  Metformin XR 500 mg 1 tablet before breakfast What recent interventions/DTPs have been made to improve glycemic control:  Patient reports none Have there been any recent hospitalizations or ED visits since last visit with CPP? No Patient denies hypoglycemic symptoms, including None Patient denies hyperglycemic symptoms, including none How often are you checking your blood sugar? twice daily What are your blood sugars ranging?  Fasting: 98/  102/ 113 After meals: in the 200's highest it has been was 220 Patient reports on the higher end readings she had been feeling weak and tired During the week, how often does your blood glucose drop below 70?  Patient reports it was about 2-3 weeks ago 65, she had some orange juice and it gradually came back up.  Adherence Review: Is the patient currently on a STATIN medication? Yes Is the patient currently on ACE/ARB medication? Yes Does the patient have >5 day gap between last estimated fill dates? No  Care Gaps: BP- 164/84 (12/24/20 spec) CCM FU -2/23 Eye Exam - Overdue Hepatitis C Screening - Overdue Zoster Vaccine - Overdue Foot Exam - Overdue COVID Booster #4 [employer]) - Overdue AWV - Last 2021- MSG sent to Ramond Craver CMA to schedule Lab Results  Component Value Date   HGBA1C 5.8 (A) 12/03/2020    Star Rating Drugs: Metformin (Glucophage XR) 500 mg - Last filled 12/20/2020 30 DS at [name] Losartan (Cozaar) 100 mg - Last filled 12/20/2020 30 DS at [name] Atorvastatin (Lipitor) 80 mg - Last filled 12/20/2020 30 DS at [facility] Pharmacist Assistant [phone]

## 2021-01-13 NOTE — Progress Notes (Deleted)
A user error has taken place: encounter opened in error, closed for administrative reasons.

## 2021-01-13 NOTE — Progress Notes (Deleted)
error 

## 2021-01-13 NOTE — Chronic Care Management (AMB) (Signed)
Created in error [place]  Clinical Pharmacist Assistant [phone]

## 2021-01-20 ENCOUNTER — Encounter: Payer: Self-pay | Admitting: Cardiology

## 2021-01-20 ENCOUNTER — Other Ambulatory Visit: Payer: Self-pay

## 2021-01-20 ENCOUNTER — Ambulatory Visit: Payer: HMO | Admitting: Cardiology

## 2021-01-20 VITALS — BP 122/78 | HR 66 | Ht 66.0 in | Wt 172.0 lb

## 2021-01-20 DIAGNOSIS — I480 Paroxysmal atrial fibrillation: Secondary | ICD-10-CM

## 2021-01-20 NOTE — Progress Notes (Signed)
Electrophysiology Office Note   Date:  01/20/2021   ID:  Blayre, [place] [birth year], MRN [id number]  PCP:  [name], [name], MD  Cardiologist:  [name] Primary Electrophysiologist:  [name], MD    No chief complaint on file.    History of Present Illness: Miranda [name] is a 74 y.o. female who is being seen today for the evaluation of atrial fibrillation at the request of [name]. Presenting today for electrophysiology evaluation.    She has a history significant for depression, GERD, hypertension,, OSA on CPAP, diabetes, atrial fibrillation.  She also has coronary calcifications found on CT scan.  She was diagnosed with PE in 2018 and started on Xarelto.  She went to the emergency room 1715 and was found to be in A. fib relation.  She is currently on Multaq.  Today, denies symptoms of palpitations, chest pain, shortness of breath, orthopnea, PND, lower extremity edema, claudication, dizziness, presyncope, syncope, bleeding, or neurologic sequela. The patient is tolerating medications without difficulties.  Since being seen she has done well.  She has had no chest pain or shortness of breath.  She is able to do all of her daily activities without restriction.  She has had some mild palpitations that occur once every couple weeks, but she is overall happy with her control.  She recently had neck surgery and is currently wearing a soft collar.   Past Medical History:  Diagnosis Date   Anxiety    Arthritis    Asthma    Atrial fibrillation (HCC)    CHF (congestive heart failure) ([place])    pt. unsure- but thinks she was hosp. for CHF- 2002   [facility]   Clotting disorder ([place])    blood clots in lungs/PE pulmonary embolism   Colon polyps    Complication of anesthesia    states requires a lot med. to put her to sleep    DDD (degenerative disc disease) 09/17/2011   Depression    "sometimes "   DM (diabetes mellitus)  ([place])    Family history of anesthesia complication    Sister had difficulty waking up from anesthesia   GERD (gastroesophageal reflux disease)    Glaucoma    bilateral, pt. admits that she is noncompliant to eye gtts.    Hemorrhoids    Hyperlipidemia    Hypertension    had stress, echo- 2006 /w [place], Cardiac Cath, per pt. 2002, echo repeated 2012- wnl    IPF (idiopathic pulmonary fibrosis) (HCC)    Low back pain    Shortness of breath    Sleep apnea    uses c-pap- q night recently   Past Surgical History:  Procedure Laterality Date   ABDOMINAL HYSTERECTOMY     ectopic, fibroids   ANTERIOR CERVICAL DECOMP/DISCECTOMY FUSION N/A 09/11/2020   Procedure: Anterior Cervical Decompression Fusion Cervical four-five, Cervical five-six;  Surgeon: [name] Mare, MD;  Location: [facility];  Service: Neurosurgery;  Laterality: N/A;   arm surgery Right    CARDIAC CATHETERIZATION     CATARACT EXTRACTION, BILATERAL     cataracts removed bilateral- ?IOL   COLONOSCOPY     remote   FISSURECTOMY  10/08/2011   Procedure: FISSURECTOMY;  Surgeon: Stark Klein, MD;  Location: [facility];  Service: General;  Laterality: N/A;   FLEXIBLE SIGMOIDOSCOPY  02/25/2011   Procedure: FLEXIBLE SIGMOIDOSCOPY;  Surgeon: Inda Castle, MD;  Location: [facility];  Service: Endoscopy;  Laterality: N/A;   FOOT SURGERY     bilat, heel spurs- screw in R foot    HEMORRHOID SURGERY  10/08/2011   Procedure: HEMORRHOIDECTOMY;  Surgeon: Stark Klein, MD;  Location: [facility];  Service: General;  Laterality: N/A;  External    ROTATOR CUFF REPAIR Right    SPHINCTEROTOMY  10/08/2011   Procedure: Joan Mayans;  Surgeon: Stark Klein, MD;  Location: [facility];  Service: General;  Laterality: N/A;     Current Outpatient Medications  Medication Sig Dispense Refill   Accu-Chek Softclix Lancets lancets Use as instructed 100 each 12   acetaminophen (TYLENOL) 325 MG tablet Take 650 mg by mouth every 6 (six) hours as needed for  moderate pain.     albuterol (VENTOLIN HFA) 108 (90 Base) MCG/ACT inhaler Inhale 1 puff into the lungs every 6 (six) hours as needed. (Patient taking differently: Inhale 1 puff into the lungs every 6 (six) hours as needed for shortness of breath or wheezing.) 18 g 1   ALPRAZolam (XANAX) 0.25 MG tablet TAKE ONE TABLET BY MOUTH THREE times daily AS NEEDED FOR ANXIETY 90 tablet 0   amLODipine (NORVASC) 10 MG tablet TAKE ONE TABLET BY MOUTH ONCE DAILY 120 tablet 1   atorvastatin (LIPITOR) 80 MG tablet TAKE ONE TABLET BY MOUTH EVERY EVENING 90 tablet 1   Continuous Blood Gluc Receiver (FREESTYLE LIBRE 14 DAY READER) DEVI 1 each by Does not apply route daily. 1 each 2   Continuous Blood Gluc Sensor (FREESTYLE LIBRE 14 DAY SENSOR) MISC USE 1  ONCE DAILY 2 each 5   cyclobenzaprine (FLEXERIL) 5 MG tablet TAKE ONE TABLET BY MOUTH EVERY EVENING 90 tablet 1   diclofenac sodium (VOLTAREN) 1 % GEL Apply 2 g topically 4 (four) times daily. (Patient taking differently: Apply 2 g topically daily as needed (pain).) 1 Tube 1   docusate sodium (COLACE) 100 MG capsule Take 1 capsule (100 mg total) by mouth 2 (two) times daily. 60 capsule 2   dronedarone (MULTAQ) 400 MG tablet TAKE 1 TABLET BY MOUTH TWICE DAILY WITH A MEAL (Patient taking differently: Take 400 mg by mouth 2 (two) times daily with a meal.) 180 tablet 3   ezetimibe (ZETIA) 10 MG tablet Take 1 tablet (10 mg total) by mouth daily. 90 tablet 1   furosemide (LASIX) 40 MG tablet Take 1/2 (one-half) tablet by mouth once daily (Patient taking differently: Take 20 mg by mouth daily.) 45 tablet 1   glucose blood (ACCU-CHEK AVIVA PLUS) test strip Use once daily for glucose control.  Dx E11.9  For accu-chek aviva 100 each 12   glucose blood (ACCU-CHEK GUIDE) test strip Use as instructed 100 each 12   hydrochlorothiazide (HYDRODIURIL) 25 MG tablet TAKE ONE TABLET BY MOUTH ONCE DAILY 90 tablet 1   HYDROcodone-acetaminophen (NORCO/VICODIN) 5-325 MG tablet Take 1-2  tablets by mouth every 6 (six) hours as needed for severe pain or moderate pain ((score 7 to 10)). 60 tablet 0   HYDROcodone-acetaminophen (NORCO/VICODIN) 5-325 MG tablet Take 1 tablet by mouth every 6 (six) hours as needed for moderate pain. 60 tablet 0   HYDROcodone-acetaminophen (NORCO/VICODIN) 5-325 MG tablet Take 1 tablet by mouth every 6 (six) hours as needed for moderate pain. 60 tablet 0   lactulose (CONSTULOSE) 10 GM/15ML solution TAKE 30 MILLILITERS BY MOUTH  THREE TIMES DAILY AS NEEDED FOR MILD CONSTIPATION (Patient taking differently: Take 20 g by mouth 3 (three) times daily as needed for moderate constipation.) 473 mL 1  lidocaine (LIDODERM) 5 % Place 1 patch onto the skin daily. Remove & Discard patch within 12 hours or as directed by MD 15 patch 0   linaclotide (LINZESS) 145 MCG CAPS capsule Take 1 capsule (145 mcg total) by mouth daily before breakfast. 30 capsule 2   losartan (COZAAR) 100 MG tablet TAKE ONE TABLET BY MOUTH EVERY EVENING 90 tablet 2   metFORMIN (GLUCOPHAGE-XR) 500 MG 24 hr tablet TAKE ONE TABLET BY MOUTH EVERY MORNING 90 tablet 0   Pirfenidone (ESBRIET) 801 MG TABS Take 1 tablet by mouth 3 (three) times daily. (Patient taking differently: Take 801 mg by mouth 3 (three) times daily.) 90 tablet 10   polyethylene glycol powder (GLYCOLAX/MIRALAX) powder Take 17 g by mouth daily. 3350 g 1   senna (SENOKOT) 8.6 MG TABS tablet Take 1 tablet by mouth daily as needed for mild constipation.     XARELTO 20 MG TABS tablet TAKE ONE TABLET BY MOUTH ONCE DAILY WITH SUPPER 90 tablet 1   No current facility-administered medications for this visit.    Allergies:   Fish oil, Other, Penicillins, Pneumococcal vaccines, Cashew nut oil, Nitrofurantoin macrocrystal, Peanut oil, Aspirin, Bactrim [sulfamethoxazole-trimethoprim], Ciprofloxacin, Ibuprofen, Influenza vaccines, Ivp dye [iodinated diagnostic agents], Latex, Macrobid [nitrofurantoin monohydrate macrocrystals], and Shellfish allergy    Social History:  The patient  reports that she has never smoked. She has never used smokeless tobacco. She reports that she does not drink alcohol and does not use drugs.   Family History:  The patient's family history includes Arthritis/Rheumatoid in her sister; Asthma in her sister; Breast cancer in her mother and sister; COPD in her sister; Colon cancer in her brother and brother; Emphysema in her sister; Heart attack in her mother; Heart disease in her mother; Lung cancer in her sister.   ROS:  Please see the history of present illness.   Otherwise, review of systems is positive for none.   All other systems are reviewed and negative.   PHYSICAL EXAM: VS:  BP 122/78   Pulse 66   Ht 5\' 6"  (1.676 m)   Wt 172 lb (78 kg)   SpO2 98%   BMI 27.76 kg/m  , BMI Body mass index is 27.76 kg/m. GEN: Well nourished, well developed, in no acute distress  HEENT: normal  Neck: no JVD, carotid bruits, or masses Cardiac: RRR; no murmurs, rubs, or gallops,no edema  Respiratory:  clear to auscultation bilaterally, normal work of breathing GI: soft, nontender, nondistended, + BS MS: no deformity or atrophy  Skin: warm and dry Neuro:  Strength and sensation are intact Psych: euthymic mood, full affect  EKG:  EKG is ordered today. Personal review of the ekg ordered shows sinus rhythm, rate 66   Recent Labs: 09/09/2020: BUN 17; Creatinine, Ser 0.93; Hemoglobin 12.8; Platelets 204; Potassium 3.0; Sodium 137    Lipid Panel     Component Value Date/Time   CHOL 123 09/18/2020 1107   CHOL 167 11/26/2017 0908   TRIG 51.0 09/18/2020 1107   HDL 59.80 09/18/2020 1107   HDL 61 11/26/2017 0908   CHOLHDL 2 09/18/2020 1107   VLDL 10.2 09/18/2020 1107   LDLCALC 53 09/18/2020 1107   LDLCALC 90 12/01/2019 1025   LDLDIRECT 152.8 03/30/2011 0859     Wt Readings from Last 3 Encounters:  01/20/21 172 lb (78 kg)  12/03/20 177 lb 14.4 oz (80.7 kg)  11/21/20 177 lb (80.3 kg)      Other studies  Reviewed: Additional studies/ records that  were reviewed today include: TTE 10/19/17  Review of the above records today demonstrates:  - Left ventricle: The cavity size was normal. Systolic function was   normal. The estimated ejection fraction was in the range of 60%   to 65%. Wall motion was normal; there were no regional wall   motion abnormalities. Doppler parameters are consistent with   abnormal left ventricular relaxation (grade 1 diastolic   dysfunction). - Aortic valve: There was no significant regurgitation. - Mitral valve: There was trivial regurgitation. - Left atrium: The atrium was mildly dilated. - Right ventricle: The cavity size was mildly dilated. Wall   thickness was normal. - Tricuspid valve: There was trivial regurgitation. - Pulmonic valve: There was trivial regurgitation. - Inferior vena cava: The vessel was normal in size. The   respirophasic diameter changes were in the normal range (>= 50%),   consistent with normal central venous pressure.   Myoview 11/12/17 Nuclear stress EF: 66%. There was no ST segment deviation noted during stress. No T wave inversion was noted during stress. The study is normal. This is a low risk study. The left ventricular ejection fraction is hyperdynamic (>65%).  ASSESSMENT AND PLAN:  1.  Paroxysmal atrial fibrillation: Currently on Xarelto and Multaq 400 mg twice daily.  CHA2DS2-VASc of 4.  High risk medication monitoring via ECG.  She has had a few short palpitations, but overall feels well.  She is overall happy with her control.  2.  Hypertension: Currently well controlled  3.  Hyperlipidemia: Continue Lipitor with a goal LDL of less than 70.  4.  Obstructive sleep apnea: CPAP compliance encouraged  5.  Coronary artery disease: Calcified LAD and circumflex found on CT scan.  No current chest pain.   Current medicines are reviewed at length with the patient today.   The patient does not have concerns regarding her  medicines.  The following changes were made today: None  Labs/ tests ordered today include:  Orders Placed This Encounter  Procedures   EKG 12-Lead      Disposition:   FU with Amardeep Beckers 6 months  Signed, Jamal Pavon Meredith Leeds, MD  01/20/2021 11:30 AM     [facility] [address] [place] [address] [phone] (office) [phone] (fax)

## 2021-01-31 DIAGNOSIS — M5412 Radiculopathy, cervical region: Secondary | ICD-10-CM | POA: Diagnosis not present

## 2021-02-09 ENCOUNTER — Other Ambulatory Visit: Payer: Self-pay | Admitting: Internal Medicine

## 2021-02-12 ENCOUNTER — Telehealth: Payer: Self-pay | Admitting: Pharmacist

## 2021-02-12 NOTE — Progress Notes (Signed)
Chronic Care Management Pharmacy Assistant   Name: DEEANN SERVIDIO  MRN: [id number] DOB: [birth year]  Reason for Encounter: Medication Review and Coordination/  Hypertension Assessment Call    Conditions to be addressed/monitored: HTN  Recent office visits:  None  Recent consult visits:  01/20/21 [name], MD (Cardiology) - Patient presented for Paroxysmal atrial fibrillation. No medication changes.  Hospital visits:  Medication Reconciliation was completed by comparing discharge summary, patient's EMR and Pharmacy list, and upon discussion with patient.   Patient presented to [facility] on 09-11-2020 for elective surgery & Cervical radiculopathy Discharge date was 09-13-2020    New?Medications Started at [facility] Discharge:?? -started  docusate sodium  methylPREDNISolone    Medication Changes at Hospital Discharge: -Changed  HYDROcodone-acetaminophen   Medications Discontinued at Hospital Discharge: -Stopped  rivaroxaban 20 MG   Medications that remain the same after Hospital Discharge:??  -All other medications will remain the same.        Medications: Outpatient Encounter Medications as of 02/12/2021  Medication Sig   Accu-Chek Softclix Lancets lancets Use as instructed   acetaminophen (TYLENOL) 325 MG tablet Take 650 mg by mouth every 6 (six) hours as needed for moderate pain.   albuterol (VENTOLIN HFA) 108 (90 Base) MCG/ACT inhaler Inhale 1 puff into the lungs every 6 (six) hours as needed. (Patient taking differently: Inhale 1 puff into the lungs every 6 (six) hours as needed for shortness of breath or wheezing.)   ALPRAZolam (XANAX) 0.25 MG tablet TAKE ONE TABLET BY MOUTH THREE times daily AS NEEDED FOR ANXIETY   amLODipine (NORVASC) 10 MG tablet TAKE ONE TABLET BY MOUTH ONCE DAILY   atorvastatin (LIPITOR) 80 MG tablet TAKE ONE TABLET BY MOUTH EVERY EVENING   Continuous Blood Gluc Receiver (FREESTYLE LIBRE 14 DAY READER) DEVI 1 each  by Does not apply route daily.   Continuous Blood Gluc Sensor (FREESTYLE LIBRE 14 DAY SENSOR) MISC USE 1  ONCE DAILY   cyclobenzaprine (FLEXERIL) 5 MG tablet TAKE ONE TABLET BY MOUTH EVERY EVENING   diclofenac sodium (VOLTAREN) 1 % GEL Apply 2 g topically 4 (four) times daily. (Patient taking differently: Apply 2 g topically daily as needed (pain).)   docusate sodium (COLACE) 100 MG capsule Take 1 capsule (100 mg total) by mouth 2 (two) times daily.   dronedarone (MULTAQ) 400 MG tablet TAKE 1 TABLET BY MOUTH TWICE DAILY WITH A MEAL (Patient taking differently: Take 400 mg by mouth 2 (two) times daily with a meal.)   ezetimibe (ZETIA) 10 MG tablet Take 1 tablet (10 mg total) by mouth daily.   furosemide (LASIX) 40 MG tablet Take 1/2 (one-half) tablet by mouth once daily (Patient taking differently: Take 20 mg by mouth daily.)   glucose blood (ACCU-CHEK AVIVA PLUS) test strip Use once daily for glucose control.  Dx E11.9  For accu-chek aviva   glucose blood (ACCU-CHEK GUIDE) test strip Use as instructed   hydrochlorothiazide (HYDRODIURIL) 25 MG tablet TAKE ONE TABLET BY MOUTH ONCE DAILY   HYDROcodone-acetaminophen (NORCO/VICODIN) 5-325 MG tablet Take 1-2 tablets by mouth every 6 (six) hours as needed for severe pain or moderate pain ((score 7 to 10)).   HYDROcodone-acetaminophen (NORCO/VICODIN) 5-325 MG tablet Take 1 tablet by mouth every 6 (six) hours as needed for moderate pain.   HYDROcodone-acetaminophen (NORCO/VICODIN) 5-325 MG tablet Take 1 tablet by mouth every 6 (six) hours as needed for moderate pain.   lactulose (CONSTULOSE) 10 GM/15ML solution TAKE 30 MILLILITERS BY MOUTH  THREE TIMES DAILY AS NEEDED FOR MILD CONSTIPATION (Patient taking differently: Take 20 g by mouth 3 (three) times daily as needed for moderate constipation.)   lidocaine (LIDODERM) 5 % Place 1 patch onto the skin daily. Remove & Discard patch within 12 hours or as directed by MD   linaclotide (LINZESS) 145 MCG CAPS capsule  Take 1 capsule (145 mcg total) by mouth daily before breakfast.   losartan (COZAAR) 100 MG tablet TAKE ONE TABLET BY MOUTH EVERY EVENING   metFORMIN (GLUCOPHAGE-XR) 500 MG 24 hr tablet TAKE ONE TABLET BY MOUTH EVERY MORNING   Pirfenidone (ESBRIET) 801 MG TABS Take 1 tablet by mouth 3 (three) times daily. (Patient taking differently: Take 801 mg by mouth 3 (three) times daily.)   polyethylene glycol powder (GLYCOLAX/MIRALAX) powder Take 17 g by mouth daily.   senna (SENOKOT) 8.6 MG TABS tablet Take 1 tablet by mouth daily as needed for mild constipation.   XARELTO 20 MG TABS tablet TAKE ONE TABLET BY MOUTH ONCE DAILY WITH SUPPER   No facility-administered encounter medications on file as of 02/12/2021.  Reviewed chart prior to disease state call. Spoke with patient regarding BP  Recent Office Vitals: BP Readings from Last 3 Encounters:  01/20/21 122/78  12/03/20 120/70  11/21/20 (!) 150/84   Pulse Readings from Last 3 Encounters:  01/20/21 66  12/03/20 69  11/21/20 (!) 59    Wt Readings from Last 3 Encounters:  01/20/21 172 lb (78 kg)  12/03/20 177 lb 14.4 oz (80.7 kg)  11/21/20 177 lb (80.3 kg)     Kidney Function Lab Results  Component Value Date/Time   CREATININE 0.93 09/09/2020 10:47 AM   CREATININE 0.99 02/05/2020 11:48 AM   CREATININE 0.93 12/01/2019 10:25 AM   GFR 75.22 10/12/2019 10:41 AM   GFRNONAA >60 09/09/2020 10:47 AM   GFRAA >60 11/04/2017 04:10 AM    BMP Latest Ref Rng & Units 09/09/2020 02/05/2020 12/01/2019  Glucose 70 - 99 mg/dL 86 95 85  BUN 8 - 23 mg/dL 17 16 16   Creatinine 0.44 - 1.00 mg/dL 0.93 0.99 0.93  BUN/Creat Ratio 6 - 22 (calc) - - NOT APPLICABLE  Sodium 956 - 145 mmol/L 137 138 136  Potassium 3.5 - 5.1 mmol/L 3.0(L) 3.2(L) 4.1  Chloride 98 - 111 mmol/L 98 99 99  CO2 22 - 32 mmol/L 28 27 23   Calcium 8.9 - 10.3 mg/dL 9.7 9.8 10.0    Current antihypertensive regimen:  Amlodipine 10mg , 1 tablet once daily Hydrochlorothiazide 25mg , 1 tablet  once daily Losartan 100mg , 1 tablet once daily How often are you checking your Blood Pressure? infrequently Current home BP readings: Office reading 122/78 on 01/20/21 What recent interventions/DTPs have been made by any provider to improve Blood Pressure control since last CPP Visit: None per patient Any recent hospitalizations or ED visits since last visit with CPP? Yes  Adherence Review: Is the patient currently on ACE/ARB medication? No Does the patient have >5 day gap between last estimated fill dates? No   Reviewed chart for medication changes ahead of medication coordination call.  Recent Relevant Labs: Lab Results  Component Value Date/Time   HGBA1C 5.8 (A) 12/03/2020 03:05 PM   HGBA1C 5.8 (A) 06/20/2020 11:16 AM   HGBA1C 6.1 12/01/2019 10:25 AM   HGBA1C 6.0 02/02/2018 12:20 PM    Kidney Function Lab Results  Component Value Date/Time   CREATININE 0.93 09/09/2020 10:47 AM   CREATININE 0.99 02/05/2020 11:48 AM   CREATININE 0.93 12/01/2019 10:25  AM   GFR 75.22 10/12/2019 10:41 AM   GFRNONAA >60 09/09/2020 10:47 AM   GFRAA >60 11/04/2017 04:10 AM    Current antihyperglycemic regimen:  Metformin 500 mg What recent interventions/DTPs have been made to improve glycemic control:  Patient reports no changes Have there been any recent hospitalizations or ED visits since last visit with CPP? Yes Patient denies hypoglycemic symptoms, including None Patient denies hyperglycemic symptoms, including none How often are you checking your blood sugar? before meals What are your blood sugars ranging?  Fasting: Patient reports her reading this morning was in the 80's She reports her highest was 180 none over 200 During the week, how often does your blood glucose drop below 70?Patient reports Not recently  Adherence Review: Is the patient currently on a STATIN medication? Yes Is the patient currently on ACE/ARB medication? No Does the patient have >5 day gap between last estimated  fill dates? No  BP Readings from Last 3 Encounters:  01/20/21 122/78  12/03/20 120/70  11/21/20 (!) 150/84    Lab Results  Component Value Date   HGBA1C 5.8 (A) 12/03/2020     Patient obtains medications through Adherence Packaging  30 Days   Last adherence delivery included:  This delivery to include: Furosemide (Lasix) 20 mg : Take one at Breakfast Atorvastatin (Lipitor) 80 mg : take one at evening meal Hydrochlorothiazide 25 mg : take one at breakfast Xarelto 20 mg : take one at evening meal Losartan Potassium (Cozaar) 100 mg :  take one at evening meal Dronedarone (Multaq) 400 mg : take one at lunch and one at evening meal Amlodipine 10 mg : take one at breakfast Ezetimbe (Zetia) 10 mg : take one at breakfast Metformin (Glucohage XR)  500 mg: take one at breakfast Cyclobenzaprine (Flexiril)  5 mg : take one at evening meal Freestyle [employer] (2 boxes 14 DS each) ** patient started on Linzess this month, call to prescriber next month for [facility]   Patient is due for next adherence delivery on: 02/24/21. Called patient and reviewed medications and coordinated delivery. Confirmed Packaging for 30 DS  This delivery to include: Amlodipine 10 mg : take one at breakfast Metformin (Glucohage XR)  500 mg: take one at breakfast Furosemide (Lasix) 20 mg : Take one at Breakfast Cyclobenzaprine (Flexiril)  5 mg : take one at evening meal Atorvastatin (Lipitor) 80 mg : take one at evening meal Hydrochlorothiazide 25 mg : take one at breakfast Xarelto 20 mg : take one at evening meal Losartan Potassium (Cozaar) 100 mg :  take one at evening meal Dronedarone (Multaq) 400 mg : take one at lunch and one at evening meal Ezetimbe (Zetia) 10 mg : take one at breakfast [employer] (2 boxes 14 DS each)  Patient needs refills for Patient reports she would not like linzess added or filled due to cost.  Patient requested her Xanax be added to  this order, refil request sent to Pharmacist as she has no more fills available.  Confirmed delivery date of 02/24/21, advised patient that pharmacy will contact them the morning of delivery.   Care Gaps: BP - 122/78 ( 01/20/21) PNA Vaccine - Overdue TDAP - [facility] CCM FU -2/23 Eye Exam - Overdue Hepatitis C Screening - Overdue Zoster Vaccine - Overdue Foot Exam - Overdue COVID Booster #4 [employer]) - Overdue AWV - Last 2021- office aware to schedule as of 9/22  Lab Results  Component Value Date  HGBA1C 5.8 (A) 12/03/2020    Star Rating Drugs: Metformin (Glucophage XR) 500 mg - Last filled 01/20/2021 30 DS at [name] Losartan (Cozaar) 100 mg - Last filled 01/20/2021 30 DS at [name] Atorvastatin (Lipitor) 80 mg - Last filled 11/7/022 30 DS at [place] Pharmacist Assistant [phone]

## 2021-02-13 ENCOUNTER — Other Ambulatory Visit: Payer: Self-pay | Admitting: *Deleted

## 2021-02-13 DIAGNOSIS — F419 Anxiety disorder, unspecified: Secondary | ICD-10-CM

## 2021-02-13 MED ORDER — ALPRAZOLAM 0.25 MG PO TABS
0.2500 mg | ORAL_TABLET | Freq: Three times a day (TID) | ORAL | 0 refills | Status: DC | PRN
Start: 2021-02-13 — End: 2021-03-14

## 2021-02-13 NOTE — Telephone Encounter (Signed)
-----   Message from [name], [name] sent at 02/12/2021  2:15 PM EST ----- Regarding: Xanax refill Hi,  Ms. Dobbins is almost out of Xanax which was last prescribed in July. Can a refill of this please be sent to [name]?  Thank you! Maddie

## 2021-03-13 ENCOUNTER — Other Ambulatory Visit: Payer: Self-pay | Admitting: Internal Medicine

## 2021-03-13 DIAGNOSIS — M25512 Pain in left shoulder: Secondary | ICD-10-CM

## 2021-03-13 DIAGNOSIS — G8929 Other chronic pain: Secondary | ICD-10-CM

## 2021-03-13 DIAGNOSIS — F419 Anxiety disorder, unspecified: Secondary | ICD-10-CM

## 2021-03-13 DIAGNOSIS — E785 Hyperlipidemia, unspecified: Secondary | ICD-10-CM

## 2021-03-14 ENCOUNTER — Ambulatory Visit: Payer: HMO

## 2021-03-14 ENCOUNTER — Encounter: Payer: Self-pay | Admitting: Internal Medicine

## 2021-03-14 ENCOUNTER — Telehealth: Payer: Self-pay | Admitting: Pharmacist

## 2021-03-14 ENCOUNTER — Ambulatory Visit (INDEPENDENT_AMBULATORY_CARE_PROVIDER_SITE_OTHER): Payer: HMO | Admitting: Internal Medicine

## 2021-03-14 VITALS — BP 100/62 | HR 62 | Temp 98.0°F | Ht 66.0 in | Wt 175.0 lb

## 2021-03-14 DIAGNOSIS — M5136 Other intervertebral disc degeneration, lumbar region: Secondary | ICD-10-CM | POA: Diagnosis not present

## 2021-03-14 DIAGNOSIS — R350 Frequency of micturition: Secondary | ICD-10-CM

## 2021-03-14 DIAGNOSIS — E1169 Type 2 diabetes mellitus with other specified complication: Secondary | ICD-10-CM

## 2021-03-14 DIAGNOSIS — I480 Paroxysmal atrial fibrillation: Secondary | ICD-10-CM

## 2021-03-14 LAB — POCT GLYCOSYLATED HEMOGLOBIN (HGB A1C): HbA1c, POC (controlled diabetic range): 6 % (ref 0.0–7.0)

## 2021-03-14 LAB — POCT URINALYSIS DIPSTICK
Glucose, UA: NEGATIVE
Ketones, UA: NEGATIVE
Nitrite, UA: NEGATIVE
Protein, UA: POSITIVE — AB
Spec Grav, UA: 1.02 (ref 1.010–1.025)
Urobilinogen, UA: 0.2 E.U./dL
pH, UA: 6 (ref 5.0–8.0)

## 2021-03-14 NOTE — Patient Instructions (Signed)
-  Nice seeing you today!!  -Remember your COVID booster.  -Schedule follow up in 3 months.

## 2021-03-14 NOTE — Progress Notes (Signed)
Established Patient Office Visit     This visit occurred during the SARS-CoV-2 public health emergency.  Safety protocols were in place, including screening questions prior to the visit, additional usage of staff PPE, and extensive cleaning of exam room while observing appropriate contact time as indicated for disinfecting solutions.    CC/Reason for Visit: Follow-up chronic conditions, discuss urinary frequency  HPI: Miranda [name] is a 75 y.o. female who is coming in today for the above mentioned reasons. Past Medical History is significant for: Hypertension, type 2 diabetes, atrial fibrillation anticoagulated on Xarelto, hyperlipidemia, idiopathic pulmonary fibrosis followed by pulmonary.  She has chronic lumbar back pain followed by Dr. Nelva Bush.  She tells me that her hydrocodone that she had been on a contract with me for 5/325 was increased to 10 mg and that Dr. Nelva Bush will be taking over these prescriptions.  Over the summer she had an ACDF for cervical radiculopathy and has recovered well.  She has had her flu vaccine but not yet her COVID booster.  She feels well and has no concerns other than chronic back pain.   Past Medical/Surgical History: Past Medical History:  Diagnosis Date   Anxiety    Arthritis    Asthma    Atrial fibrillation (HCC)    CHF (congestive heart failure) ([place])    pt. unsure- but thinks she was hosp. for CHF- 2002   [facility] disease    [facility]   Clotting disorder ([place])    blood clots in lungs/PE pulmonary embolism   Colon polyps    Complication of anesthesia    states requires a lot med. to put her to sleep    DDD (degenerative disc disease) 09/17/2011   Depression    "sometimes "   DM (diabetes mellitus) ([place])    Family history of anesthesia complication    Sister had difficulty waking up from anesthesia   GERD (gastroesophageal reflux disease)    Glaucoma    bilateral, pt. admits that she is noncompliant to eye gtts.     Hemorrhoids    Hyperlipidemia    Hypertension    had stress, echo- 2006 /w [place], Cardiac Cath, per pt. 2002, echo repeated 2012- wnl    IPF (idiopathic pulmonary fibrosis) (HCC)    Low back pain    Shortness of breath    Sleep apnea    uses c-pap- q night recently    Past Surgical History:  Procedure Laterality Date   ABDOMINAL HYSTERECTOMY     ectopic, fibroids   ANTERIOR CERVICAL DECOMP/DISCECTOMY FUSION N/A 09/11/2020   Procedure: Anterior Cervical Decompression Fusion Cervical four-five, Cervical five-six;  Surgeon: [name] Mare, MD;  Location: [place];  Service: Neurosurgery;  Laterality: N/A;   arm surgery Right    CARDIAC CATHETERIZATION     CATARACT EXTRACTION, BILATERAL     cataracts removed bilateral- ?IOL   COLONOSCOPY     remote   FISSURECTOMY  10/08/2011   Procedure: FISSURECTOMY;  Surgeon: Stark Klein, MD;  Location: [place];  Service: General;  Laterality: N/A;   FLEXIBLE SIGMOIDOSCOPY  02/25/2011   Procedure: FLEXIBLE SIGMOIDOSCOPY;  Surgeon: Inda Castle, MD;  Location: [facility];  Service: Endoscopy;  Laterality: N/A;   FOOT SURGERY     bilat, heel spurs- screw in R foot    HEMORRHOID SURGERY  10/08/2011   Procedure: HEMORRHOIDECTOMY;  Surgeon: Stark Klein, MD;  Location: [place];  Service: General;  Laterality: N/A;  External  ROTATOR CUFF REPAIR Right    SPHINCTEROTOMY  10/08/2011   Procedure: SPHINCTEROTOMY;  Surgeon: Stark Klein, MD;  Location: [place];  Service: General;  Laterality: N/A;    Social History:  reports that she has never smoked. She has never used smokeless tobacco. She reports that she does not drink alcohol and does not use drugs.  Allergies: Allergies  Allergen Reactions   Fish Oil Anaphylaxis   Other Hives, Shortness Of Breath and Swelling    Allergic to cashew nuts and peanut oil.   Penicillins Anaphylaxis, Hives and Swelling    Has patient had a PCN reaction causing immediate rash, facial/tongue/throat swelling, SOB  or lightheadedness with hypotension: Face swelling and hives started first, then swelling of the throat  Has patient had a PCN reaction causing severe rash involving mucus membranes or skin necrosis: Yes  Has patient had a PCN reaction that required hospitalization: No  Has patient had a PCN reaction occurring within the last 10 years: Yes  If all of the above answers are "NO", then may proceed with Cephalospor   Pneumococcal Vaccines Nausea And Vomiting   Cashew Nut Oil    Nitrofurantoin Macrocrystal Hives   Peanut Oil    Aspirin Itching and Rash   Bactrim [Sulfamethoxazole-Trimethoprim] Hives, Itching and Rash   Ciprofloxacin Hives, Itching and Rash   Ibuprofen Rash   Influenza Vaccines Hives   Ivp Dye [Iodinated Contrast Media] Hives, Itching and Rash    Gives benadryl to counteract symptoms   Latex Rash   Macrobid [Nitrofurantoin Monohydrate Macrocrystals] Hives   Shellfish Allergy Hives    Patient also allergic to seafood    Family History:  Family History  Problem Relation Age of Onset   Heart attack Mother    Heart disease Mother    Breast cancer Mother    Emphysema Sister    Breast cancer Sister    Arthritis/Rheumatoid Sister    Asthma Sister    Lung cancer Sister    COPD Sister    Colon cancer Brother    Colon cancer Brother    Anesthesia problems Neg Hx    Esophageal cancer Neg Hx    Rectal cancer Neg Hx    Stomach cancer Neg Hx      Current Outpatient Medications:    Accu-Chek Softclix Lancets lancets, Use as instructed, Disp: 100 each, Rfl: 12   acetaminophen (TYLENOL) 325 MG tablet, Take 650 mg by mouth every 6 (six) hours as needed for moderate pain., Disp: , Rfl:    albuterol (VENTOLIN HFA) 108 (90 Base) MCG/ACT inhaler, Inhale 1 puff into the lungs every 6 (six) hours as needed. (Patient taking differently: Inhale 1 puff into the lungs every 6 (six) hours as needed for shortness of breath or wheezing.), Disp: 18 g, Rfl: 1   ALPRAZolam (XANAX) 0.25 MG  tablet, TAKE ONE TABLET BY MOUTH three times daily AS NEEDED FOR ANXIETY, Disp: 90 tablet, Rfl: 0   amLODipine (NORVASC) 10 MG tablet, TAKE ONE TABLET BY MOUTH ONCE DAILY, Disp: 120 tablet, Rfl: 1   atorvastatin (LIPITOR) 80 MG tablet, TAKE ONE TABLET BY MOUTH EVERY EVENING, Disp: 90 tablet, Rfl: 1   Continuous Blood Gluc Receiver (FREESTYLE LIBRE 14 DAY READER) DEVI, 1 each by Does not apply route daily., Disp: 1 each, Rfl: 2   Continuous Blood Gluc Sensor (FREESTYLE LIBRE 14 DAY SENSOR) MISC, USE 1  ONCE DAILY, Disp: 2 each, Rfl: 5   cyclobenzaprine (FLEXERIL) 5 MG tablet, TAKE ONE TABLET BY  MOUTH EVERY EVENING, Disp: 90 tablet, Rfl: 1   diclofenac sodium (VOLTAREN) 1 % GEL, Apply 2 g topically 4 (four) times daily. (Patient taking differently: Apply 2 g topically daily as needed (pain).), Disp: 1 Tube, Rfl: 1   docusate sodium (COLACE) 100 MG capsule, Take 1 capsule (100 mg total) by mouth 2 (two) times daily., Disp: 60 capsule, Rfl: 2   dronedarone (MULTAQ) 400 MG tablet, TAKE 1 TABLET BY MOUTH TWICE DAILY WITH A MEAL (Patient taking differently: Take 400 mg by mouth 2 (two) times daily with a meal.), Disp: 180 tablet, Rfl: 3   ezetimibe (ZETIA) 10 MG tablet, TAKE ONE TABLET BY MOUTH ONCE DAILY, Disp: 90 tablet, Rfl: 1   furosemide (LASIX) 40 MG tablet, Take 1/2 (one-half) tablet by mouth once daily (Patient taking differently: Take 20 mg by mouth daily.), Disp: 45 tablet, Rfl: 1   glucose blood (ACCU-CHEK AVIVA PLUS) test strip, Use once daily for glucose control.  Dx E11.9  For accu-chek aviva, Disp: 100 each, Rfl: 12   glucose blood (ACCU-CHEK GUIDE) test strip, Use as instructed, Disp: 100 each, Rfl: 12   hydrochlorothiazide (HYDRODIURIL) 25 MG tablet, TAKE ONE TABLET BY MOUTH ONCE DAILY, Disp: 90 tablet, Rfl: 1   HYDROcodone-acetaminophen (NORCO/VICODIN) 5-325 MG tablet, Take 1-2 tablets by mouth every 6 (six) hours as needed for severe pain or moderate pain ((score 7 to 10))., Disp: 60  tablet, Rfl: 0   HYDROcodone-acetaminophen (NORCO/VICODIN) 5-325 MG tablet, Take 1 tablet by mouth every 6 (six) hours as needed for moderate pain., Disp: 60 tablet, Rfl: 0   HYDROcodone-acetaminophen (NORCO/VICODIN) 5-325 MG tablet, Take 1 tablet by mouth every 6 (six) hours as needed for moderate pain., Disp: 60 tablet, Rfl: 0   lactulose (CONSTULOSE) 10 GM/15ML solution, TAKE 30 MILLILITERS BY MOUTH  THREE TIMES DAILY AS NEEDED FOR MILD CONSTIPATION (Patient taking differently: Take 20 g by mouth 3 (three) times daily as needed for moderate constipation.), Disp: 473 mL, Rfl: 1   lidocaine (LIDODERM) 5 %, Place 1 patch onto the skin daily. Remove & Discard patch within 12 hours or as directed by MD, Disp: 15 patch, Rfl: 0   linaclotide (LINZESS) 145 MCG CAPS capsule, Take 1 capsule (145 mcg total) by mouth daily before breakfast., Disp: 30 capsule, Rfl: 2   losartan (COZAAR) 100 MG tablet, TAKE ONE TABLET BY MOUTH EVERY EVENING, Disp: 90 tablet, Rfl: 2   metFORMIN (GLUCOPHAGE-XR) 500 MG 24 hr tablet, TAKE ONE TABLET BY MOUTH EVERY MORNING, Disp: 90 tablet, Rfl: 0   Pirfenidone (ESBRIET) 801 MG TABS, Take 1 tablet by mouth 3 (three) times daily. (Patient taking differently: Take 801 mg by mouth 3 (three) times daily.), Disp: 90 tablet, Rfl: 10   polyethylene glycol powder (GLYCOLAX/MIRALAX) powder, Take 17 g by mouth daily., Disp: 3350 g, Rfl: 1   senna (SENOKOT) 8.6 MG TABS tablet, Take 1 tablet by mouth daily as needed for mild constipation., Disp: , Rfl:    XARELTO 20 MG TABS tablet, TAKE ONE TABLET BY MOUTH ONCE DAILY WITH SUPPER, Disp: 90 tablet, Rfl: 1  Review of Systems:  Constitutional: Denies fever, chills, diaphoresis, appetite change and fatigue.  HEENT: Denies photophobia, eye pain, redness, hearing loss, ear pain, congestion, sore throat, rhinorrhea, sneezing, mouth sores, trouble swallowing, neck pain, neck stiffness and tinnitus.   Respiratory: Denies SOB, DOE, cough, chest tightness,   and wheezing.   Cardiovascular: Denies chest pain, palpitations and leg swelling.  Gastrointestinal: Denies nausea, vomiting, abdominal pain,  diarrhea, constipation, blood in stool and abdominal distention.  Genitourinary: Denies dysuria, urgency, frequency, hematuria, flank pain and difficulty urinating.  Endocrine: Denies: hot or cold intolerance, sweats, changes in hair or nails, polyuria, polydipsia. Musculoskeletal: Denies  joint swelling, arthralgias. Skin: Denies pallor, rash and wound.  Neurological: Denies dizziness, seizures, syncope, weakness, light-headedness, numbness and headaches.  Hematological: Denies adenopathy. Easy bruising, personal or family bleeding history  Psychiatric/Behavioral: Denies suicidal ideation, mood changes, confusion, nervousness, sleep disturbance and agitation    Physical Exam: Vitals:   03/14/21 1001  BP: 100/62  Pulse: 62  Temp: 98 F (36.7 C)  TempSrc: Oral  SpO2: 99%  Weight: 175 lb (79.4 kg)  Height: 5\' 6"  (1.676 m)    Body mass index is 28.25 kg/m.   Constitutional: NAD, calm, comfortable Eyes: PERRL, lids and conjunctivae normal ENMT: Mucous membranes are moist.  Respiratory: clear to auscultation bilaterally, no wheezing, no crackles. Normal respiratory effort. No accessory muscle use.  Cardiovascular: Regular rate and rhythm, no murmurs / rubs / gallops. No extremity edema.  Psychiatric: Normal judgment and insight. Alert and oriented x 3. Normal mood.    Impression and Plan:  Frequent urination - Plan: POC Urinalysis Dipstick  Type 2 diabetes mellitus with other specified complication, without long-term current use of insulin ([place]) - Plan: POC HgB A1c  Degeneration of lumbar intervertebral disc  Paroxysmal atrial fibrillation (HCC)  -A1c demonstrates good diabetic control at 6.0. -She has been advised to get her COVID booster. -In office urine dipstick shows: 3+ blood and 2+ leukocytes.  Unfortunately she has many  antibiotic allergies including anaphylaxis with penicillin, Cipro, sulfa, Macrobid.  I will send for culture and await results prior to treating. -PDMP has been reviewed, overdose risk or is 240, she has no red flags.  She tells me that Dr. Nelva Bush has taken over her narcotic prescription after an increased dose.  Time spent: 31 minutes reviewing chart, interviewing and examining patient and formulating plan of care.   Patient Instructions  -Nice seeing you today!!  -Remember your COVID booster.  -Schedule follow up in 3 months.    [name], MD [facility] at [facility]

## 2021-03-14 NOTE — Chronic Care Management (AMB) (Addendum)
Chronic Care Management Pharmacy Assistant   Name: Miranda [name]  MRN: [id number] DOB: 1945/05/27   Reason for Encounter: Medication Review   Conditions to be addressed/monitored: HTN  Recent office visits:  03/14/21 [name], [name], MD - Patient presented for Frequent Urination and other concerns. No medication changes  Recent consult visits:  None   Hospital visits:  Medication Reconciliation was completed by comparing discharge summary, patients EMR and Pharmacy list, and upon discussion with patient.   Patient presented to [facility] on 09-11-2020 for elective surgery & Cervical radiculopathy Discharge date was 09-13-2020    New?Medications Started at [facility] Discharge:?? -started  docusate sodium  methylPREDNISolone    Medication Changes at Hospital Discharge: -Changed  HYDROcodone-acetaminophen   Medications Discontinued at Hospital Discharge: -Stopped  rivaroxaban 20 MG   Medications that remain the same after Hospital Discharge:??  -All other medications will remain the same.        Medications: Outpatient Encounter Medications as of 03/14/2021  Medication Sig   Accu-Chek Softclix Lancets lancets Use as instructed   acetaminophen (TYLENOL) 325 MG tablet Take 650 mg by mouth every 6 (six) hours as needed for moderate pain.   albuterol (VENTOLIN HFA) 108 (90 Base) MCG/ACT inhaler Inhale 1 puff into the lungs every 6 (six) hours as needed. (Patient taking differently: Inhale 1 puff into the lungs every 6 (six) hours as needed for shortness of breath or wheezing.)   ALPRAZolam (XANAX) 0.25 MG tablet TAKE ONE TABLET BY MOUTH three times daily AS NEEDED FOR ANXIETY   amLODipine (NORVASC) 10 MG tablet TAKE ONE TABLET BY MOUTH ONCE DAILY   atorvastatin (LIPITOR) 80 MG tablet TAKE ONE TABLET BY MOUTH EVERY EVENING   Continuous Blood Gluc Receiver (FREESTYLE LIBRE 14 DAY READER) DEVI 1 each by Does not apply route daily.   Continuous  Blood Gluc Sensor (FREESTYLE LIBRE 14 DAY SENSOR) MISC USE 1  ONCE DAILY   cyclobenzaprine (FLEXERIL) 5 MG tablet TAKE ONE TABLET BY MOUTH EVERY EVENING   diclofenac sodium (VOLTAREN) 1 % GEL Apply 2 g topically 4 (four) times daily. (Patient taking differently: Apply 2 g topically daily as needed (pain).)   docusate sodium (COLACE) 100 MG capsule Take 1 capsule (100 mg total) by mouth 2 (two) times daily.   dronedarone (MULTAQ) 400 MG tablet TAKE 1 TABLET BY MOUTH TWICE DAILY WITH A MEAL (Patient taking differently: Take 400 mg by mouth 2 (two) times daily with a meal.)   ezetimibe (ZETIA) 10 MG tablet TAKE ONE TABLET BY MOUTH ONCE DAILY   furosemide (LASIX) 40 MG tablet Take 1/2 (one-half) tablet by mouth once daily (Patient taking differently: Take 20 mg by mouth daily.)   glucose blood (ACCU-CHEK AVIVA PLUS) test strip Use once daily for glucose control.  Dx E11.9  For accu-chek aviva   glucose blood (ACCU-CHEK GUIDE) test strip Use as instructed   hydrochlorothiazide (HYDRODIURIL) 25 MG tablet TAKE ONE TABLET BY MOUTH ONCE DAILY   HYDROcodone-acetaminophen (NORCO/VICODIN) 5-325 MG tablet Take 1-2 tablets by mouth every 6 (six) hours as needed for severe pain or moderate pain ((score 7 to 10)).   HYDROcodone-acetaminophen (NORCO/VICODIN) 5-325 MG tablet Take 1 tablet by mouth every 6 (six) hours as needed for moderate pain.   HYDROcodone-acetaminophen (NORCO/VICODIN) 5-325 MG tablet Take 1 tablet by mouth every 6 (six) hours as needed for moderate pain.   lactulose (CONSTULOSE) 10 GM/15ML solution TAKE 30 MILLILITERS BY MOUTH  THREE TIMES DAILY AS  NEEDED FOR MILD CONSTIPATION (Patient taking differently: Take 20 g by mouth 3 (three) times daily as needed for moderate constipation.)   lidocaine (LIDODERM) 5 % Place 1 patch onto the skin daily. Remove & Discard patch within 12 hours or as directed by MD   linaclotide (LINZESS) 145 MCG CAPS capsule Take 1 capsule (145 mcg total) by mouth daily before  breakfast.   losartan (COZAAR) 100 MG tablet TAKE ONE TABLET BY MOUTH EVERY EVENING   metFORMIN (GLUCOPHAGE-XR) 500 MG 24 hr tablet TAKE ONE TABLET BY MOUTH EVERY MORNING   Pirfenidone (ESBRIET) 801 MG TABS Take 1 tablet by mouth 3 (three) times daily. (Patient taking differently: Take 801 mg by mouth 3 (three) times daily.)   polyethylene glycol powder (GLYCOLAX/MIRALAX) powder Take 17 g by mouth daily.   senna (SENOKOT) 8.6 MG TABS tablet Take 1 tablet by mouth daily as needed for mild constipation.   XARELTO 20 MG TABS tablet TAKE ONE TABLET BY MOUTH ONCE DAILY WITH SUPPER   No facility-administered encounter medications on file as of 03/14/2021.  Reviewed chart prior to disease state call. Spoke with patient regarding BP  Recent Office Vitals: BP Readings from Last 3 Encounters:  03/14/21 100/62  01/20/21 122/78  12/03/20 120/70   Pulse Readings from Last 3 Encounters:  03/14/21 62  01/20/21 66  12/03/20 69    Wt Readings from Last 3 Encounters:  03/14/21 175 lb (79.4 kg)  01/20/21 172 lb (78 kg)  12/03/20 177 lb 14.4 oz (80.7 kg)     Kidney Function Lab Results  Component Value Date/Time   CREATININE 0.93 09/09/2020 10:47 AM   CREATININE 0.99 02/05/2020 11:48 AM   CREATININE 0.93 12/01/2019 10:25 AM   GFR 75.22 10/12/2019 10:41 AM   GFRNONAA >60 09/09/2020 10:47 AM   GFRAA >60 11/04/2017 04:10 AM    BMP Latest Ref Rng & Units 09/09/2020 02/05/2020 12/01/2019  Glucose 70 - 99 mg/dL 86 95 85  BUN 8 - 23 mg/dL 17 16 16   Creatinine 0.44 - 1.00 mg/dL 0.93 0.99 0.93  BUN/Creat Ratio 6 - 22 (calc) - - NOT APPLICABLE  Sodium 720 - 145 mmol/L 137 138 136  Potassium 3.5 - 5.1 mmol/L 3.0(L) 3.2(L) 4.1  Chloride 98 - 111 mmol/L 98 99 99  CO2 22 - 32 mmol/L 28 27 23   Calcium 8.9 - 10.3 mg/dL 9.7 9.8 10.0    Current antihypertensive regimen:  Amlodipine 10mg , 1 tablet once daily Hydrochlorothiazide 25mg , 1 tablet once daily Losartan 100mg , 1 tablet once daily How often are  you checking your Blood Pressure? weekly Current home BP readings: Office reading on 03/14/21 100/62 What recent interventions/DTPs have been made by any provider to improve Blood Pressure control since last CPP Visit: Patient reports no changes Any recent hospitalizations or ED visits since last visit with CPP? None What exercise is being done to improve your Blood Pressure Control?  Patient reports she has been having a little trouble lately with her back and some pain, saw her PCP on today.  Adherence Review: Is the patient currently on ACE/ARB medication? No Does the patient have >5 day gap between last estimated fill dates? No  Reviewed chart for medication changes ahead of medication coordination call.  No OVs, Consults, or hospital visits since last care coordination call/Pharmacist visit. (If appropriate, list visit date, provider name)  No medication changes indicated OR if recent visit, treatment plan here.  BP Readings from Last 3 Encounters:  03/14/21 100/62  01/20/21  122/78  12/03/20 120/70    Lab Results  Component Value Date   HGBA1C 6.0 03/14/2021     Patient obtains medications through Adherence Packaging  30 Days   Last adherence delivery included:  Amlodipine 10 mg : take one at breakfast Metformin (Glucohage XR)  500 mg: take one at breakfast Furosemide (Lasix) 20 mg : Take one at Breakfast Cyclobenzaprine (Flexiril)  5 mg : take one at evening meal Atorvastatin (Lipitor) 80 mg : take one at evening meal Hydrochlorothiazide 25 mg : take one at breakfast Xarelto 20 mg : take one at evening meal Losartan Potassium (Cozaar) 100 mg :  take one at evening meal Dronedarone (Multaq) 400 mg : take one at lunch and one at evening meal Ezetimbe (Zetia) 10 mg : take one at breakfast [employer] (2 boxes 14 DS each)   Patient needed refills for Patient reports she would not like linzess added or filled due to cost.  Patient requested her Xanax be added  to this order, refil request sent to Pharmacist as she has no more fills available.   Patient is due for next adherence delivery on: 03/25/20. Called patient and reviewed medications and coordinated delivery. Confirmed Packaging for 30 DS   This delivery to include: Ezetimbe (Zetia) 10 mg : take one at breakfast Amlodipine 10 mg : take one at breakfast Cyclobenzaprine (Flexiril)  5 mg : take one at evening meal Furosemide (Lasix) 20 mg : Take one at Breakfast Atorvastatin (Lipitor) 80 mg : take one at evening meal Hydrochlorothiazide 25 mg : take one at breakfast Xarelto 20 mg : take one at evening meal Losartan Potassium (Cozaar) 100 mg :  take one at evening meal Dronedarone (Multaq) 400 mg : take one at lunch and one at evening meal Alprazolam (Xanax) 0.25 mg : Take 1 tab 3 times daily as needed  Coordinated fill for : [employer] (2 boxes 14 DS each)  Patient declined the following medications  : Metformin (Glucohage XR)  500 mg: take one at breakfast ; Patient reports she is only taking when her sugar is high has a lot on hand    Confirmed delivery date of 03/25/21, advised patient that pharmacy will contact them the morning of delivery.   Care Gaps: BP - 100/62 (03/13/21) PNA Vaccine - Overdue TDAP - [facility] FU -2/23 Eye Exam - Overdue Hepatitis C Screening - Overdue Zoster Vaccine - Overdue Foot Exam - Overdue COVID Booster #4 [employer]) - Overdue AWV - Last 2021- office aware to schedule as of 9/22 Lab Results  Component Value Date   HGBA1C 6.0 03/14/2021    Star Rating Drugs: Metformin (Glucophage XR) 500 mg - Last filled 02/17/2021 30 DS at [name] Losartan (Cozaar) 100 mg - Last filled 02/17/2021 30 DS at [name] Atorvastatin (Lipitor) 80 mg - Last filled 02/17/2021 30 DS at [name]   Patient Assistance: None  [facility] Clinical Pharmacist Assistant [phone]

## 2021-03-15 LAB — URINE CULTURE
MICRO NUMBER:: 12813677
SPECIMEN QUALITY:: ADEQUATE

## 2021-03-20 DIAGNOSIS — M5136 Other intervertebral disc degeneration, lumbar region: Secondary | ICD-10-CM | POA: Diagnosis not present

## 2021-03-20 DIAGNOSIS — M5412 Radiculopathy, cervical region: Secondary | ICD-10-CM | POA: Diagnosis not present

## 2021-03-20 DIAGNOSIS — M5416 Radiculopathy, lumbar region: Secondary | ICD-10-CM | POA: Diagnosis not present

## 2021-03-20 DIAGNOSIS — M5030 Other cervical disc degeneration, unspecified cervical region: Secondary | ICD-10-CM | POA: Diagnosis not present

## 2021-03-28 ENCOUNTER — Telehealth: Payer: Self-pay | Admitting: Internal Medicine

## 2021-03-28 NOTE — Telephone Encounter (Signed)
Lilia Pro with dr [facility] is calling and they extracted 2 teeth last Friday and pt developed liver clot at one of the extraction site . Pt is on xarelto and dr scott would like to know if pt needs to hold her xarelto. Pt is sch for 04-01-2021 to remove liver clot and removing may cause some additional bleeding. Please advise

## 2021-03-31 ENCOUNTER — Telehealth: Payer: Self-pay | Admitting: Internal Medicine

## 2021-03-31 NOTE — Telephone Encounter (Signed)
This has been taking care of.

## 2021-03-31 NOTE — Telephone Encounter (Signed)
Please advise 

## 2021-03-31 NOTE — Telephone Encounter (Signed)
Miranda [name] with dr [facility] is calling and they extracted 2 teeth last Friday and pt developed liver clot at one of the extraction site . Pt is on xarelto and dr scott would like to know if pt needs to hold her xarelto. Pt is sch for 04-01-2021 to remove liver clot and removing may cause some additional bleeding. Please adviseMorgan with dr [facility] is calling and they extracted 2 teeth last Friday and pt developed liver clot at one of the extraction site . Pt is on xarelto and dr scott would like to know if pt needs to hold her xarelto. Pt is sch for 04-01-2021 to remove liver clot and removing may cause some additional bleeding. Please advise    Today 03/31/2021 Miranda [name] with Dr Dr [facility] office called to follow up with the above mentioned message that was left on Friday the 13th. Miranda [name] states that a call would be great: [phone] or a fax with verbals would be good as well Fax: [phone]  Dr Romie Minus does not see patient until tomorrow 04/01/2021, and would like to know the steps moving forward

## 2021-03-31 NOTE — Telephone Encounter (Signed)
Duplicate message. However I think more information is in this note

## 2021-03-31 NOTE — Telephone Encounter (Signed)
Pt notified of update as well as Lilia Pro from [place] office and verbalized understanding.

## 2021-04-08 DIAGNOSIS — M5416 Radiculopathy, lumbar region: Secondary | ICD-10-CM | POA: Diagnosis not present

## 2021-04-11 ENCOUNTER — Other Ambulatory Visit: Payer: Self-pay | Admitting: Internal Medicine

## 2021-04-11 DIAGNOSIS — F419 Anxiety disorder, unspecified: Secondary | ICD-10-CM

## 2021-04-12 NOTE — Telephone Encounter (Signed)
Last refill per controlled substance database: 03/22/21 for 30day supply Last OV: 12/03/20 Next OV: none scheduled

## 2021-04-14 ENCOUNTER — Encounter (HOSPITAL_COMMUNITY): Payer: Self-pay

## 2021-04-14 ENCOUNTER — Inpatient Hospital Stay (HOSPITAL_COMMUNITY)
Admission: EM | Admit: 2021-04-14 | Discharge: 2021-04-17 | DRG: 683 | Disposition: A | Discharge status: Home Health | Payer: HMO | Attending: Family Medicine | Admitting: Family Medicine

## 2021-04-14 ENCOUNTER — Other Ambulatory Visit: Payer: Self-pay

## 2021-04-14 ENCOUNTER — Inpatient Hospital Stay (HOSPITAL_COMMUNITY): Payer: HMO

## 2021-04-14 ENCOUNTER — Emergency Department (HOSPITAL_COMMUNITY): Payer: HMO

## 2021-04-14 DIAGNOSIS — E785 Hyperlipidemia, unspecified: Secondary | ICD-10-CM | POA: Diagnosis not present

## 2021-04-14 DIAGNOSIS — Z6827 Body mass index (BMI) 27.0-27.9, adult: Secondary | ICD-10-CM

## 2021-04-14 DIAGNOSIS — E440 Moderate protein-calorie malnutrition: Secondary | ICD-10-CM | POA: Diagnosis present

## 2021-04-14 DIAGNOSIS — I480 Paroxysmal atrial fibrillation: Secondary | ICD-10-CM | POA: Diagnosis not present

## 2021-04-14 DIAGNOSIS — I248 Other forms of acute ischemic heart disease: Secondary | ICD-10-CM | POA: Diagnosis not present

## 2021-04-14 DIAGNOSIS — R079 Chest pain, unspecified: Secondary | ICD-10-CM

## 2021-04-14 DIAGNOSIS — Z7984 Long term (current) use of oral hypoglycemic drugs: Secondary | ICD-10-CM | POA: Diagnosis not present

## 2021-04-14 DIAGNOSIS — J84112 Idiopathic pulmonary fibrosis: Secondary | ICD-10-CM | POA: Diagnosis not present

## 2021-04-14 DIAGNOSIS — E119 Type 2 diabetes mellitus without complications: Secondary | ICD-10-CM | POA: Diagnosis not present

## 2021-04-14 DIAGNOSIS — I10 Essential (primary) hypertension: Secondary | ICD-10-CM

## 2021-04-14 DIAGNOSIS — H409 Unspecified glaucoma: Secondary | ICD-10-CM | POA: Diagnosis present

## 2021-04-14 DIAGNOSIS — G8929 Other chronic pain: Secondary | ICD-10-CM | POA: Diagnosis not present

## 2021-04-14 DIAGNOSIS — N179 Acute kidney failure, unspecified: Principal | ICD-10-CM | POA: Diagnosis present

## 2021-04-14 DIAGNOSIS — Z20822 Contact with and (suspected) exposure to covid-19: Secondary | ICD-10-CM | POA: Diagnosis present

## 2021-04-14 DIAGNOSIS — I5032 Chronic diastolic (congestive) heart failure: Secondary | ICD-10-CM | POA: Diagnosis not present

## 2021-04-14 DIAGNOSIS — R197 Diarrhea, unspecified: Secondary | ICD-10-CM | POA: Diagnosis not present

## 2021-04-14 DIAGNOSIS — Z86711 Personal history of pulmonary embolism: Secondary | ICD-10-CM | POA: Diagnosis not present

## 2021-04-14 DIAGNOSIS — Z90710 Acquired absence of both cervix and uterus: Secondary | ICD-10-CM

## 2021-04-14 DIAGNOSIS — Z800 Family history of malignant neoplasm of digestive organs: Secondary | ICD-10-CM

## 2021-04-14 DIAGNOSIS — E876 Hypokalemia: Secondary | ICD-10-CM | POA: Diagnosis not present

## 2021-04-14 DIAGNOSIS — I2510 Atherosclerotic heart disease of native coronary artery without angina pectoris: Secondary | ICD-10-CM | POA: Diagnosis not present

## 2021-04-14 DIAGNOSIS — F32A Depression, unspecified: Secondary | ICD-10-CM | POA: Diagnosis present

## 2021-04-14 DIAGNOSIS — K219 Gastro-esophageal reflux disease without esophagitis: Secondary | ICD-10-CM | POA: Diagnosis present

## 2021-04-14 DIAGNOSIS — I110 Hypertensive heart disease with heart failure: Secondary | ICD-10-CM | POA: Diagnosis not present

## 2021-04-14 DIAGNOSIS — R0789 Other chest pain: Secondary | ICD-10-CM | POA: Diagnosis not present

## 2021-04-14 DIAGNOSIS — R778 Other specified abnormalities of plasma proteins: Secondary | ICD-10-CM | POA: Diagnosis not present

## 2021-04-14 DIAGNOSIS — Z801 Family history of malignant neoplasm of trachea, bronchus and lung: Secondary | ICD-10-CM

## 2021-04-14 DIAGNOSIS — Z8249 Family history of ischemic heart disease and other diseases of the circulatory system: Secondary | ICD-10-CM

## 2021-04-14 DIAGNOSIS — Z803 Family history of malignant neoplasm of breast: Secondary | ICD-10-CM

## 2021-04-14 DIAGNOSIS — I959 Hypotension, unspecified: Secondary | ICD-10-CM | POA: Diagnosis not present

## 2021-04-14 DIAGNOSIS — E875 Hyperkalemia: Secondary | ICD-10-CM | POA: Diagnosis not present

## 2021-04-14 DIAGNOSIS — F419 Anxiety disorder, unspecified: Secondary | ICD-10-CM | POA: Diagnosis present

## 2021-04-14 DIAGNOSIS — Z7901 Long term (current) use of anticoagulants: Secondary | ICD-10-CM

## 2021-04-14 DIAGNOSIS — Z825 Family history of asthma and other chronic lower respiratory diseases: Secondary | ICD-10-CM

## 2021-04-14 DIAGNOSIS — Z86010 Personal history of colonic polyps: Secondary | ICD-10-CM

## 2021-04-14 DIAGNOSIS — E860 Dehydration: Secondary | ICD-10-CM | POA: Diagnosis not present

## 2021-04-14 DIAGNOSIS — G4733 Obstructive sleep apnea (adult) (pediatric): Secondary | ICD-10-CM | POA: Diagnosis present

## 2021-04-14 DIAGNOSIS — K5900 Constipation, unspecified: Secondary | ICD-10-CM | POA: Diagnosis present

## 2021-04-14 DIAGNOSIS — J479 Bronchiectasis, uncomplicated: Secondary | ICD-10-CM | POA: Diagnosis not present

## 2021-04-14 DIAGNOSIS — Z79899 Other long term (current) drug therapy: Secondary | ICD-10-CM

## 2021-04-14 LAB — COMPREHENSIVE METABOLIC PANEL
ALT: 21 U/L (ref 0–44)
AST: 26 U/L (ref 15–41)
Albumin: 4.1 g/dL (ref 3.5–5.0)
Alkaline Phosphatase: 39 U/L (ref 38–126)
Anion gap: 16 — ABNORMAL HIGH (ref 5–15)
BUN: 24 mg/dL — ABNORMAL HIGH (ref 8–23)
CO2: 28 mmol/L (ref 22–32)
Calcium: 9.9 mg/dL (ref 8.9–10.3)
Chloride: 92 mmol/L — ABNORMAL LOW (ref 98–111)
Creatinine, Ser: 2.44 mg/dL — ABNORMAL HIGH (ref 0.44–1.00)
GFR, Estimated: 20 mL/min — ABNORMAL LOW (ref >60)
Glucose, Bld: 117 mg/dL — ABNORMAL HIGH (ref 70–99)
Potassium: 2.9 mmol/L — ABNORMAL LOW (ref 3.5–5.1)
Sodium: 136 mmol/L (ref 135–145)
Total Bilirubin: 0.6 mg/dL (ref 0.3–1.2)
Total Protein: 8 g/dL (ref 6.5–8.1)

## 2021-04-14 LAB — ECHOCARDIOGRAM COMPLETE
AR max vel: 2.03 cm2
AV Area VTI: 1.88 cm2
AV Area mean vel: 1.95 cm2
AV Mean grad: 9 mmHg
AV Peak grad: 18.8 mmHg
Ao pk vel: 2.17 m/s
Area-P 1/2: 2.26 cm2
Height: 65 in
S' Lateral: 2.9 cm
Weight: 2640 oz

## 2021-04-14 LAB — RESP PANEL BY RT-PCR (FLU A&B, COVID) ARPGX2
Influenza A by PCR: NEGATIVE
Influenza B by PCR: NEGATIVE
SARS Coronavirus 2 by RT PCR: NEGATIVE

## 2021-04-14 LAB — BRAIN NATRIURETIC PEPTIDE: B Natriuretic Peptide: 42.9 pg/mL (ref 0.0–100.0)

## 2021-04-14 LAB — TSH: TSH: 1.274 u[IU]/mL (ref 0.350–4.500)

## 2021-04-14 LAB — CBC
HCT: 38.2 % (ref 36.0–46.0)
Hemoglobin: 13 g/dL (ref 12.0–15.0)
MCH: 28.8 pg (ref 26.0–34.0)
MCHC: 34 g/dL (ref 30.0–36.0)
MCV: 84.7 fL (ref 80.0–100.0)
Platelets: 216 10*3/uL (ref 150–400)
RBC: 4.51 MIL/uL (ref 3.87–5.11)
RDW: 13.2 % (ref 11.5–15.5)
WBC: 7 10*3/uL (ref 4.0–10.5)
nRBC: 0 % (ref 0.0–0.2)

## 2021-04-14 LAB — GLUCOSE, CAPILLARY
Glucose-Capillary: 144 mg/dL — ABNORMAL HIGH (ref 70–99)
Glucose-Capillary: 100 mg/dL — ABNORMAL HIGH (ref 70–99)

## 2021-04-14 LAB — LIPID PANEL
Cholesterol: 130 mg/dL (ref 0–200)
HDL: 60 mg/dL (ref >40)
LDL Cholesterol: 56 mg/dL (ref 0–99)
Total CHOL/HDL Ratio: 2.2 RATIO
Triglycerides: 72 mg/dL (ref <150)
VLDL: 14 mg/dL (ref 0–40)

## 2021-04-14 LAB — TROPONIN I (HIGH SENSITIVITY)
Troponin I (High Sensitivity): 24 ng/L — ABNORMAL HIGH (ref <18)
Troponin I (High Sensitivity): 21 ng/L — ABNORMAL HIGH (ref <18)

## 2021-04-14 LAB — MAGNESIUM: Magnesium: 2.2 mg/dL (ref 1.7–2.4)

## 2021-04-14 MED ORDER — SODIUM CHLORIDE 0.9 % IV SOLN: INTRAVENOUS | Status: DC

## 2021-04-14 MED ORDER — SODIUM CHLORIDE 0.9% FLUSH
3.0000 mL | Freq: Two times a day (BID) | INTRAVENOUS | Status: DC
  Administered 2021-04-14 – 2021-04-17 (×6): 3 mL via INTRAVENOUS

## 2021-04-14 MED ORDER — RIVAROXABAN 20 MG PO TABS
20.0000 mg | ORAL_TABLET | Freq: Every day | ORAL | Status: DC
  Filled 2021-04-14: qty 1

## 2021-04-14 MED ORDER — ONDANSETRON HCL 4 MG/2ML IJ SOLN
4.0000 mg | Freq: Once | INTRAMUSCULAR | Status: AC
  Administered 2021-04-14: 4 mg via INTRAVENOUS
  Filled 2021-04-14: qty 2

## 2021-04-14 MED ORDER — ONDANSETRON HCL 4 MG PO TABS
4.0000 mg | ORAL_TABLET | Freq: Four times a day (QID) | ORAL | Status: DC | PRN
Start: 2021-04-14 — End: 2021-04-17

## 2021-04-14 MED ORDER — POTASSIUM CHLORIDE CRYS ER 20 MEQ PO TBCR
40.0000 meq | EXTENDED_RELEASE_TABLET | Freq: Once | ORAL | Status: AC
  Administered 2021-04-14: 40 meq via ORAL
  Filled 2021-04-14: qty 2

## 2021-04-14 MED ORDER — ALPRAZOLAM 0.25 MG PO TABS
0.2500 mg | ORAL_TABLET | Freq: Three times a day (TID) | ORAL | Status: DC | PRN
  Administered 2021-04-15: 0.25 mg via ORAL
  Filled 2021-04-14: qty 1

## 2021-04-14 MED ORDER — CYCLOBENZAPRINE HCL 5 MG PO TABS
5.0000 mg | ORAL_TABLET | Freq: Every day | ORAL | Status: DC
  Administered 2021-04-14 – 2021-04-17 (×3): 5 mg via ORAL
  Filled 2021-04-14 (×3): qty 1

## 2021-04-14 MED ORDER — SODIUM CHLORIDE 0.9 % IV BOLUS: 1000.0000 mL | Freq: Once | INTRAVENOUS | Status: DC

## 2021-04-14 MED ORDER — HYDROCODONE-ACETAMINOPHEN 10-325 MG PO TABS
1.0000 | ORAL_TABLET | Freq: Four times a day (QID) | ORAL | Status: DC | PRN
  Administered 2021-04-14 – 2021-04-17 (×4): 1 via ORAL
  Filled 2021-04-14 (×4): qty 1

## 2021-04-14 MED ORDER — ACETAMINOPHEN 650 MG RE SUPP: 650.0000 mg | Freq: Four times a day (QID) | RECTAL | Status: DC | PRN

## 2021-04-14 MED ORDER — RIVAROXABAN 15 MG PO TABS
15.0000 mg | ORAL_TABLET | Freq: Every day | ORAL | Status: DC
  Administered 2021-04-14: 15 mg via ORAL
  Filled 2021-04-14: qty 1

## 2021-04-14 MED ORDER — LACTATED RINGERS IV BOLUS
1000.0000 mL | Freq: Once | INTRAVENOUS | Status: AC
  Administered 2021-04-14: 1000 mL via INTRAVENOUS

## 2021-04-14 MED ORDER — AMLODIPINE BESYLATE 10 MG PO TABS
10.0000 mg | ORAL_TABLET | Freq: Every day | ORAL | Status: DC
  Administered 2021-04-15: 10 mg via ORAL
  Filled 2021-04-14: qty 1

## 2021-04-14 MED ORDER — FENTANYL CITRATE PF 50 MCG/ML IJ SOSY
50.0000 ug | PREFILLED_SYRINGE | INTRAMUSCULAR | Status: DC | PRN
  Administered 2021-04-14: 50 ug via INTRAVENOUS
  Filled 2021-04-14: qty 1

## 2021-04-14 MED ORDER — ENSURE ENLIVE PO LIQD
237.0000 mL | Freq: Two times a day (BID) | ORAL | Status: DC
  Administered 2021-04-15: 237 mL via ORAL

## 2021-04-14 MED ORDER — ATORVASTATIN CALCIUM 80 MG PO TABS
80.0000 mg | ORAL_TABLET | Freq: Every evening | ORAL | Status: DC
  Administered 2021-04-14 – 2021-04-16 (×3): 80 mg via ORAL
  Filled 2021-04-14 (×3): qty 1

## 2021-04-14 MED ORDER — PIRFENIDONE 801 MG PO TABS
801.0000 mg | ORAL_TABLET | Freq: Three times a day (TID) | ORAL | Status: DC
  Administered 2021-04-15 – 2021-04-17 (×6): 801 mg via ORAL
  Filled 2021-04-14 (×8): qty 1

## 2021-04-14 MED ORDER — POTASSIUM CHLORIDE 10 MEQ/100ML IV SOLN
10.0000 meq | INTRAVENOUS | Status: DC
  Administered 2021-04-14 (×2): 10 meq via INTRAVENOUS
  Filled 2021-04-14 (×2): qty 100

## 2021-04-14 MED ORDER — DRONEDARONE HCL 400 MG PO TABS
400.0000 mg | ORAL_TABLET | Freq: Two times a day (BID) | ORAL | Status: DC
  Administered 2021-04-14 – 2021-04-17 (×6): 400 mg via ORAL
  Filled 2021-04-14 (×8): qty 1

## 2021-04-14 MED ORDER — ONDANSETRON HCL 4 MG/2ML IJ SOLN: 4.0000 mg | Freq: Four times a day (QID) | INTRAMUSCULAR | Status: DC | PRN

## 2021-04-14 MED ORDER — ACETAMINOPHEN 325 MG PO TABS: 650.0000 mg | ORAL_TABLET | Freq: Four times a day (QID) | ORAL | Status: DC | PRN

## 2021-04-14 NOTE — Discharge Instructions (Addendum)
° °  Stable ambulatory patient discharging home with her daughter.

## 2021-04-14 NOTE — H&P (Signed)
History and Physical    Miranda [name] EXB:[id number] DOB: [birth year] DOA: 04/14/2021  Referring MD/NP/PA: Pati Gallo, PA-C PCP: [name], [name], MD  Patient coming from: Home  Chief Complaint: Heart racing and chest pain  I have personally briefly reviewed patient's old medical records in [place]   HPI: Miranda [name] is a 76 y.o. female with medical history significant of hypertension, hyperlipidemia, CHF, paroxysmal atrial fibrillation on anticoagulation, diabetes mellitus, pulmonary embolism, idiopathic pulmonary fibrosis, depression, and GERD who presented with complaints of feeling as though her heart is racing and chest pain.  Symptoms have been occurring over the last 1-2 weeks she reports having centralized chest pain when episodes occur with intermittent nausea and vomiting.  She reports that she had recently had a dental procedure with removal two teeth 2-3 weeks ago.  Since that time she has not had much of an appetite.  She also had reported complaints of constipation, but had taken several stool softeners and subsequently developed diarrhea.  Denies seeing any blood in stools, but states that her stools are just watery at this time.  Other associated symptoms include generalized fatigue, weakness, shortness of breath especially with episodes, and intermittent cough.  Denies having any significant fever, mouth pain/swelling, or dysuria symptoms.  ED Course: Upon admission into the emergency department patient was seen to be afebrile, pulse 52-84, respirations 14-21, blood pressure 107/48-121/48, and O2 saturations maintained on room air.  Labs significant for CBC within normal limits, potassium 2.9 chloride 92, CO2 28, BUN 24, creatinine 2.44, anion gap 16, TSH 1.274, BNP 42.9,and high-sensitivity troponin 24->21.  Chest x-ray noted chronic fibrotic lung changes without signs of acute abnormality.  Patient has been given 1 L of lactated Ringer's, potassium  chloride 40 mEq, and Zofran.  Cardiology have been formally consulted.  TRH called to admit.  Review of Systems  Constitutional:  Positive for malaise/fatigue. Negative for fever.  Respiratory:  Positive for cough and shortness of breath.   Cardiovascular:  Positive for chest pain.  Gastrointestinal:  Positive for diarrhea and nausea.  Neurological:  Positive for weakness.  Psychiatric/Behavioral:  Negative for substance abuse.    Past Medical History:  Diagnosis Date   Anxiety    Arthritis    Asthma    Atrial fibrillation (HCC)    CHF (congestive heart failure) ([place])    pt. unsure- but thinks she was hosp. for CHF- 2002   [facility] disease    [facility]   Clotting disorder ([place])    blood clots in lungs/PE pulmonary embolism   Colon polyps    Complication of anesthesia    states requires a lot med. to put her to sleep    DDD (degenerative disc disease) 09/17/2011   Depression    "sometimes "   DM (diabetes mellitus) ([place])    Family history of anesthesia complication    Sister had difficulty waking up from anesthesia   GERD (gastroesophageal reflux disease)    Glaucoma    bilateral, pt. admits that she is noncompliant to eye gtts.    Hemorrhoids    Hyperlipidemia    Hypertension    had stress, echo- 2006 /w [place], Cardiac Cath, per pt. 2002, echo repeated 2012- wnl    IPF (idiopathic pulmonary fibrosis) (HCC)    Low back pain    Shortness of breath    Sleep apnea    uses c-pap- q night recently    Past Surgical History:  Procedure Laterality  Date   ABDOMINAL HYSTERECTOMY     ectopic, fibroids   ANTERIOR CERVICAL DECOMP/DISCECTOMY FUSION N/A 09/11/2020   Procedure: Anterior Cervical Decompression Fusion Cervical four-five, Cervical five-six;  Surgeon: [name] Mare, MD;  Location: [facility];  Service: Neurosurgery;  Laterality: N/A;   arm surgery Right    CARDIAC CATHETERIZATION     CATARACT EXTRACTION, BILATERAL     cataracts removed bilateral-  ?IOL   COLONOSCOPY     remote   FISSURECTOMY  10/08/2011   Procedure: FISSURECTOMY;  Surgeon: Stark Klein, MD;  Location: [facility];  Service: General;  Laterality: N/A;   FLEXIBLE SIGMOIDOSCOPY  02/25/2011   Procedure: FLEXIBLE SIGMOIDOSCOPY;  Surgeon: Inda Castle, MD;  Location: [facility];  Service: Endoscopy;  Laterality: N/A;   FOOT SURGERY     bilat, heel spurs- screw in R foot    HEMORRHOID SURGERY  10/08/2011   Procedure: HEMORRHOIDECTOMY;  Surgeon: Stark Klein, MD;  Location: [facility];  Service: General;  Laterality: N/A;  External    ROTATOR CUFF REPAIR Right    SPHINCTEROTOMY  10/08/2011   Procedure: Joan Mayans;  Surgeon: Stark Klein, MD;  Location: [facility];  Service: General;  Laterality: N/A;     reports that she has never smoked. She has never used smokeless tobacco. She reports that she does not drink alcohol and does not use drugs.  Allergies  Allergen Reactions   Fish Oil Anaphylaxis   Other Hives, Shortness Of Breath and Swelling    Allergic to cashew nuts and peanut oil.   Penicillins Anaphylaxis, Hives and Swelling    Has patient had a PCN reaction causing immediate rash, facial/tongue/throat swelling, SOB or lightheadedness with hypotension: Face swelling and hives started first, then swelling of the throat  Has patient had a PCN reaction causing severe rash involving mucus membranes or skin necrosis: Yes  Has patient had a PCN reaction that required hospitalization: No  Has patient had a PCN reaction occurring within the last 10 years: Yes  If all of the above answers are "NO", then may proceed with Cephalospor   Pneumococcal Vaccines Nausea And Vomiting   Cashew Nut Oil    Nitrofurantoin Macrocrystal Hives   Peanut Oil    Aspirin Itching and Rash   Bactrim [Sulfamethoxazole-Trimethoprim] Hives, Itching and Rash   Ciprofloxacin Hives, Itching and Rash   Ibuprofen Rash   Influenza Vaccines Hives   Ivp Dye [Iodinated Contrast Media] Hives, Itching and  Rash    Gives benadryl to counteract symptoms   Latex Rash   Macrobid [Nitrofurantoin Monohydrate Macrocrystals] Hives   Shellfish Allergy Hives    Patient also allergic to seafood    Family History  Problem Relation Age of Onset   Heart attack Mother    Heart disease Mother    Breast cancer Mother    Emphysema Sister    Breast cancer Sister    Arthritis/Rheumatoid Sister    Asthma Sister    Lung cancer Sister    COPD Sister    Colon cancer Brother    Colon cancer Brother    Anesthesia problems Neg Hx    Esophageal cancer Neg Hx    Rectal cancer Neg Hx    Stomach cancer Neg Hx     Prior to Admission medications   Medication Sig Start Date End Date Taking? Authorizing Provider  Accu-Chek Softclix Lancets lancets Use as instructed 04/04/20   [name], [name], MD  acetaminophen (TYLENOL) 325 MG tablet Take 650 mg by mouth  every 6 (six) hours as needed for moderate pain.    [provider]  albuterol (VENTOLIN HFA) 108 (90 Base) MCG/ACT inhaler Inhale 1 puff into the lungs every 6 (six) hours as needed. Patient taking differently: Inhale 1 puff into the lungs every 6 (six) hours as needed for shortness of breath or wheezing. 06/23/19   [name], [name], MD  ALPRAZolam Duanne Moron) 0.25 MG tablet TAKE ONE TABLET BY MOUTH three times daily AS NEEDED 04/14/21   [name], [name], MD  amLODipine (NORVASC) 10 MG tablet TAKE ONE TABLET BY MOUTH ONCE DAILY 01/10/21   [name], [name], MD  atorvastatin (LIPITOR) 80 MG tablet TAKE ONE TABLET BY MOUTH EVERY EVENING 03/14/21   [name], [name], MD  Continuous Blood Gluc Receiver (FREESTYLE LIBRE 14 DAY READER) DEVI 1 each by Does not apply route daily. 04/04/20   [name], [name], MD  Continuous Blood Gluc Sensor (FREESTYLE LIBRE 14 DAY SENSOR) MISC USE 1  ONCE DAILY 12/06/20   [name], [name], MD  cyclobenzaprine (FLEXERIL) 5 MG tablet TAKE ONE TABLET BY MOUTH EVERY EVENING  03/14/21   [name], [name], MD  diclofenac sodium (VOLTAREN) 1 % GEL Apply 2 g topically 4 (four) times daily. Patient taking differently: Apply 2 g topically daily as needed (pain). 11/04/17   Caren Griffins, MD  docusate sodium (COLACE) 100 MG capsule Take 1 capsule (100 mg total) by mouth 2 (two) times daily. 09/13/20   [name] Mare, MD  dronedarone (MULTAQ) 400 MG tablet TAKE 1 TABLET BY MOUTH TWICE DAILY WITH A MEAL Patient taking differently: Take 400 mg by mouth 2 (two) times daily with a meal. 08/26/20   [name], [name], MD  ezetimibe (ZETIA) 10 MG tablet TAKE ONE TABLET BY MOUTH ONCE DAILY 02/12/21   [name], [name], MD  furosemide (LASIX) 40 MG tablet Take 1/2 (one-half) tablet by mouth once daily Patient taking differently: Take 20 mg by mouth daily. 05/29/20   [name], [name], MD  glucose blood (ACCU-CHEK AVIVA PLUS) test strip Use once daily for glucose control.  Dx E11.9  For accu-chek aviva 03/12/20   [name], [name], MD  glucose blood (ACCU-CHEK GUIDE) test strip Use as instructed 04/04/20   [name], [name], MD  hydrochlorothiazide (HYDRODIURIL) 25 MG tablet TAKE ONE TABLET BY MOUTH ONCE DAILY 11/14/20   [name], [name], MD  HYDROcodone-acetaminophen (NORCO/VICODIN) 5-325 MG tablet Take 1-2 tablets by mouth every 6 (six) hours as needed for severe pain or moderate pain ((score 7 to 10)). 12/04/20   [name], [name], MD  HYDROcodone-acetaminophen (NORCO/VICODIN) 5-325 MG tablet Take 1 tablet by mouth every 6 (six) hours as needed for moderate pain. 12/04/20   [name], [name], MD  HYDROcodone-acetaminophen (NORCO/VICODIN) 5-325 MG tablet Take 1 tablet by mouth every 6 (six) hours as needed for moderate pain. 12/04/20   [name], [name], MD  lactulose (CONSTULOSE) 10 GM/15ML solution TAKE 30 MILLILITERS BY MOUTH  THREE TIMES DAILY AS NEEDED FOR MILD CONSTIPATION Patient taking differently:  Take 20 g by mouth 3 (three) times daily as needed for moderate constipation. 08/15/20   [name], [name], MD  lidocaine (LIDODERM) 5 % Place 1 patch onto the skin daily. Remove & Discard patch within 12 hours or as directed by MD 02/05/20   Quintella Reichert, MD  linaclotide [facility]) 145 MCG CAPS capsule Take 1 capsule (145 mcg  total) by mouth daily before breakfast. 12/16/20   Danis, Kirke Corin, MD  losartan (COZAAR) 100 MG tablet TAKE ONE TABLET BY MOUTH EVERY EVENING 11/14/20   [name], [name], MD  metFORMIN (GLUCOPHAGE-XR) 500 MG 24 hr tablet TAKE ONE TABLET BY MOUTH EVERY MORNING 01/10/21   [name], [name], MD  Pirfenidone (ESBRIET) 801 MG TABS Take 1 tablet by mouth 3 (three) times daily. Patient taking differently: Take 801 mg by mouth 3 (three) times daily. 05/01/20   Mannam, Hart Robinsons, MD  polyethylene glycol powder (GLYCOLAX/MIRALAX) powder Take 17 g by mouth daily. 06/09/18   [name], [name], MD  senna (SENOKOT) 8.6 MG TABS tablet Take 1 tablet by mouth daily as needed for mild constipation.    [provider]  XARELTO 20 MG TABS tablet TAKE ONE TABLET BY MOUTH ONCE DAILY WITH SUPPER 11/14/20   [name], [name], MD    Physical Exam:  Constitutional: Elderly female who appears to be fatigued but in no acute distress Vitals:   04/14/21 0744 04/14/21 0815 04/14/21 0830 04/14/21 0900  BP: (!) 110/48 113/60 (!) 121/48 (!) 107/48  Pulse: (!) 52     Resp: 16 18 14 15   Temp: 98.7 F (37.1 C)     TempSrc: Oral     SpO2: 100% 100%    Weight:      Height:       Eyes: PERRL, lids and conjunctivae normal ENMT: Mucous membranes are moist. Posterior pharynx clear of any exudate or lesions.  Neck: normal, supple, no masses, no thyromegaly Respiratory: clear to auscultation bilaterally, no wheezing, no crackles. Normal respiratory effort. No accessory muscle use.  Cardiovascular: Regular rate and rhythm, 2/6 systolic murmur.  No significant  lower extremity edema. Abdomen: no tenderness, no masses palpated. Bowel sounds positive.  Musculoskeletal: no clubbing / cyanosis. No joint deformity upper and lower extremities. Good ROM, no contractures. Normal muscle tone.  Skin: no rashes, lesions, ulcers. No induration Neurologic: CN 2-12 grossly intact.  Strength 5/5 in all 4 extremities Psychiatric: Normal judgment and insight. Alert and oriented x 3.  Anxious mood.     Labs on Admission: I have personally reviewed following labs and imaging studies  CBC: Recent Labs  Lab 04/14/21 0600  WBC 7.0  HGB 13.0  HCT 38.2  MCV 84.7  PLT 657   Basic Metabolic Panel: Recent Labs  Lab 04/14/21 0600  NA 136  K 2.9*  CL 92*  CO2 28  GLUCOSE 117*  BUN 24*  CREATININE 2.44*  CALCIUM 9.9   GFR: Estimated Creatinine Clearance: 20.5 mL/min (A) (by C-G formula based on SCr of 2.44 mg/dL (H)). Liver Function Tests: Recent Labs  Lab 04/14/21 0600  AST 26  ALT 21  ALKPHOS 39  BILITOT 0.6  PROT 8.0  ALBUMIN 4.1   No results for input(s): LIPASE, AMYLASE in the last 168 hours. No results for input(s): AMMONIA in the last 168 hours. Coagulation Profile: No results for input(s): INR, PROTIME in the last 168 hours. Cardiac Enzymes: No results for input(s): CKTOTAL, CKMB, CKMBINDEX, TROPONINI in the last 168 hours. BNP (last 3 results) No results for input(s): PROBNP in the last 8760 hours. HbA1C: No results for input(s): HGBA1C in the last 72 hours. CBG: No results for input(s): GLUCAP in the last 168 hours. Lipid Profile: No results for input(s): CHOL, HDL, LDLCALC, TRIG, CHOLHDL, LDLDIRECT in the last 72 hours. Thyroid Function Tests: No results for input(s): TSH, T4TOTAL, FREET4,  T3FREE, THYROIDAB in the last 72 hours. Anemia Panel: No results for input(s): VITAMINB12, FOLATE, FERRITIN, TIBC, IRON, RETICCTPCT in the last 72 hours. Urine analysis:    Component Value Date/Time   COLORURINE YELLOW 04/16/2015 1558    APPEARANCEUR CLEAR 04/16/2015 1558   LABSPEC 1.011 04/16/2015 1558   PHURINE 6.0 04/16/2015 1558   GLUCOSEU NEGATIVE 04/16/2015 1558   HGBUR TRACE (A) 04/16/2015 1558   HGBUR trace-lysed 03/26/2010 0831   BILIRUBINUR 1+ 03/14/2021 1032   KETONESUR NEGATIVE 04/16/2015 1558   PROTEINUR Positive (A) 03/14/2021 1032   PROTEINUR NEGATIVE 04/16/2015 1558   UROBILINOGEN 0.2 03/14/2021 1032   UROBILINOGEN 0.2 09/14/2011 1133   NITRITE neg 03/14/2021 1032   NITRITE NEGATIVE 04/16/2015 1558   LEUKOCYTESUR Small (1+) (A) 03/14/2021 1032   Sepsis Labs: Recent Results (from the past 240 hour(s))  Resp Panel by RT-PCR (Flu A&B, Covid) Nasopharyngeal Swab     Status: None   Collection Time: 04/14/21  6:51 AM   Specimen: Nasopharyngeal Swab; Nasopharyngeal(NP) swabs in vial transport medium  Result Value Ref Range Status   SARS Coronavirus 2 by RT PCR NEGATIVE NEGATIVE Final    Comment: (NOTE) SARS-CoV-2 target nucleic acids are NOT DETECTED.  The SARS-CoV-2 RNA is generally detectable in upper respiratory specimens during the acute phase of infection. The lowest concentration of SARS-CoV-2 viral copies this assay can detect is 138 copies/mL. A negative result does not preclude SARS-Cov-2 infection and should not be used as the sole basis for treatment or other patient management decisions. A negative result may occur with  improper specimen collection/handling, submission of specimen other than nasopharyngeal swab, presence of viral mutation(s) within the areas targeted by this assay, and inadequate number of viral copies(<138 copies/mL). A negative result must be combined with clinical observations, patient history, and epidemiological information. The expected result is Negative.  Fact Sheet for Patients:  EntrepreneurPulse.com.au  Fact Sheet for Healthcare Providers:  IncredibleEmployment.be  This test is no t yet approved or cleared by the [place] FDA and  has been authorized for detection and/or diagnosis of SARS-CoV-2 by FDA under an Emergency Use Authorization (EUA). This EUA will remain  in effect (meaning this test can be used) for the duration of the COVID-19 declaration under Section 564(b)(1) of the Act, 21 U.S.C.section 360bbb-3(b)(1), unless the authorization is terminated  or revoked sooner.       Influenza A by PCR NEGATIVE NEGATIVE Final   Influenza B by PCR NEGATIVE NEGATIVE Final    Comment: (NOTE) The Xpert Xpress SARS-CoV-2/FLU/RSV plus assay is intended as an aid in the diagnosis of influenza from Nasopharyngeal swab specimens and should not be used as a sole basis for treatment. Nasal washings and aspirates are unacceptable for Xpert Xpress SARS-CoV-2/FLU/RSV testing.  Fact Sheet for Patients: EntrepreneurPulse.com.au  Fact Sheet for Healthcare Providers: IncredibleEmployment.be  This test is not yet approved or cleared by the [place] FDA and has been authorized for detection and/or diagnosis of SARS-CoV-2 by FDA under an Emergency Use Authorization (EUA). This EUA will remain in effect (meaning this test can be used) for the duration of the COVID-19 declaration under Section 564(b)(1) of the Act, 21 U.S.C. section 360bbb-3(b)(1), unless the authorization is terminated or revoked.  Performed at [facility], [place] [address], [place], [place] [address]      Radiological Exams on Admission: DG Chest 2 View  Result Date: 04/14/2021 CLINICAL DATA:  Chest pain. EXAM: CHEST - 2 VIEW COMPARISON:  Chest XRs,  most recently 02/05/2020.  CT chest, 10 1. FINDINGS: Cardiomediastinal silhouette is within normal limits. Aortic atherosclerosis. Mild hyperinflation. Similar appearance of traction bronchiectasis and peribronchovascular opacities, greatest at the LEFT lung base. No pleural effusion or pneumothorax. ACDF, incompletely imaged. LEFT upper extremity  glucose monitor. No acute displaced fracture. IMPRESSION: Chronic fibrotic lung change. No discrete acute superimposed cardiopulmonary process. Electronically Signed   By: Michaelle Birks M.D.   On: 04/14/2021 06:25    EKG: Independently reviewed.  Normal sinus rhythm at 73 bpm  Assessment/Plan Chest pain  elevated troponin: Acute.  Patient presents with complaints of chest pain and palpitations.  High-sensitivity troponin 24->21.  No utility in checking D-dimer as it appears its been chronically elevated in the past.  Patient last had a low risk stress test in 2019.  Cardiology have been formally consulted given patient's risk factors.  Suspect symptoms may be provoked by intermittent episodes of paroxysmal atrial fibrillation -Admit to a telemetry bed -Check echocardiogram -Appreciate cardiology consultative services, will follow-up for any further recommendations.  Acute kidney injury: Patient presents with creatinine 2.44 with BUN 24.  Baseline creatinine previously have been 0.93 last year. -Check urinalysis -Check urine creatinine and urine urea  to calculate FeUr -Hold nephrotoxic agents -Normal saline IV fluids 100 mL/h -Recheck kidney function in a.m.  Hypokalemia: Acute.  Initial potassium 2.9.  Suspect secondary to reports of diarrhea patient had been given 40 mEq of potassium chloride p.o. -Give potassium chloride 20 mEq IV -Check magnesium level -Continue to monitor and replace as needed  Diarrhea: Patient reported recently being constipated for which she took multiple medications, but subsequently developed diarrhea. -Hold laxatives and stool softeners for now -Monitor intake and output  Paroxysmal atrial fibrillation on chronic anticoagulation: Patient appears to be in sinus rhythm at this time.  Suspect patient going in and out of atrial fibrillation as cause of symptoms.  Thyroid studies were within normal limits. -Continue Xarelto and Multaq -Goal of at least potassium 4  and magnesium 2  Idiopathic pulmonary fibrosis: Patient reports having some mild shortness of breath but O2 saturations currently maintained on room air.  Chest x-ray noted chronic fibrotic changes without any acute abnormality. -Continue pirfenidone and outpatient follow-up with pulmonology -Albuterol nebs as needed  Essential hypertension: Home blood pressure regimen includes amlodipine 10 mg daily, furosemide 20 mg daily, hydrochlorothiazide 25 mg daily, losartan 100 mg daily, Multaq 400 mg twice daily. -Continue Multaq -Resume amlodipine in a.m. -Held hydrochlorothiazide, furosemide, and losartan due to AKI  Diabetes mellitus type 2: Last available hemoglobin A1c was 5.8.  Home medication regimen includes metformin 500 mg daily. -Hold metformin due to AKI -Heart healthy and carb modified diet  Generalized malaise: Acute.  Patient reports just not feeling well with poor appetite since tooth extraction.  Suspect symptoms likely coming from dehydration. -Follow-up urinalysis -PT to evaluate  Diastolic congestive heart failure: Patient does not appear to be grossly fluid overloaded at this time.  Last EF noted to be 60-65% with grade 1 diastolic dysfunction in 0/6237. -Strict intake and output and daily weight -Holding diuretics at this time.  Chronic pain: Patient has history of chronic back pain.  She is on hydrocodone as needed. -Continue hydrocodone as needed  History of PE -Continue Xarelto  Anxiety -Continue Xanax as needed  Hyperlipidemia -Continue atorvastatin   DVT prophylaxis:  Xarelto Code Status: Full Family Communication: Left voicemail on Angela's phone Disposition Plan: Hopefully discharge home once medically stable Consults called: Cardiology Admission status: Inpatient,  likely require more than 2 midnight stay for need of IV hydration in setting of acute kidney injury  Norval Morton MD [facility]   If 7PM-7AM, please contact  night-coverage   04/14/2021, 9:29 AM

## 2021-04-14 NOTE — ED Provider Notes (Signed)
[facility] EMERGENCY DEPARTMENT Provider Note   CSN: [id number] Arrival date & time: 04/14/21  0551     History  Chief Complaint  Patient presents with   Chest Pain   Shortness of Breath    Miranda [name] is a 76 y.o. female.   Chest Pain Associated symptoms: shortness of breath   Shortness of Breath Associated symptoms: chest pain    Patient is a 76 year old female with a past medical history notable for HTN, DM2, dyslipidemia, OSA, coronary artery calcifications seen on CT scan, interstitial lung disease, LVH, A. fib, VTE on DOAC  Patient is presented to emergency room today with complaints of chest pain she states that it is sternal radiating to her left shoulder she is uncertain whether it is exertional or not.  Seems to be constant since 1 AM this morning but is been intermittent over the past 2 days.  She states that she does not have a prescription for nitroglycerin does not typically suffer from chest pain.  She states that she has never had a heart catheterization done she does see a cardiologist here at with [facility].  Does endorse some nausea and states that she was diaphoretic this morning.  Denies any cough hemoptysis but does endorse mild rhinorrhea.  Denies any leg swelling unilateral or bilateral.  Denies any fevers lightheadedness or dizziness.    Home Medications Prior to Admission medications   Medication Sig Start Date End Date Taking? Authorizing Provider  Accu-Chek Softclix Lancets lancets Use as instructed 04/04/20   [name], [name], MD  acetaminophen (TYLENOL) 325 MG tablet Take 650 mg by mouth every 6 (six) hours as needed for moderate pain.    [provider]  albuterol (VENTOLIN HFA) 108 (90 Base) MCG/ACT inhaler Inhale 1 puff into the lungs every 6 (six) hours as needed. Patient taking differently: Inhale 1 puff into the lungs every 6 (six) hours as needed for shortness of breath or wheezing. 06/23/19   [name], [name], MD  ALPRAZolam Duanne Moron) 0.25 MG tablet TAKE ONE TABLET BY MOUTH three times daily AS NEEDED 04/14/21   [name], [name], MD  amLODipine (NORVASC) 10 MG tablet TAKE ONE TABLET BY MOUTH ONCE DAILY 01/10/21   [name], [name], MD  atorvastatin (LIPITOR) 80 MG tablet TAKE ONE TABLET BY MOUTH EVERY EVENING 03/14/21   [name], [name], MD  Continuous Blood Gluc Receiver (FREESTYLE LIBRE 14 DAY READER) DEVI 1 each by Does not apply route daily. 04/04/20   [name], [name], MD  Continuous Blood Gluc Sensor (FREESTYLE LIBRE 14 DAY SENSOR) MISC USE 1  ONCE DAILY 12/06/20   [name], [name], MD  cyclobenzaprine (FLEXERIL) 5 MG tablet TAKE ONE TABLET BY MOUTH EVERY EVENING 03/14/21   [name], [name], MD  diclofenac sodium (VOLTAREN) 1 % GEL Apply 2 g topically 4 (four) times daily. Patient taking differently: Apply 2 g topically daily as needed (pain). 11/04/17   Caren Griffins, MD  docusate sodium (COLACE) 100 MG capsule Take 1 capsule (100 mg total) by mouth 2 (two) times daily. 09/13/20   [name] Mare, MD  dronedarone ([place]) 400 MG tablet TAKE 1 TABLET BY MOUTH TWICE DAILY WITH A MEAL Patient taking differently: Take 400 mg by mouth 2 (two) times daily with a meal. 08/26/20   [name], [name], MD  ezetimibe (ZETIA) 10 MG tablet TAKE ONE TABLET BY MOUTH ONCE DAILY 02/12/21   Jerilee Hoh  Everardo Beals, MD  furosemide (LASIX) 20 MG tablet Take 20 mg by mouth every morning. 03/22/21   [provider]  furosemide (LASIX) 40 MG tablet Take 1/2 (one-half) tablet by mouth once daily Patient taking differently: Take 20 mg by mouth daily. 05/29/20   [name], [name], MD  glucose blood (ACCU-CHEK AVIVA PLUS) test strip Use once daily for glucose control.  Dx E11.9  For accu-chek aviva 03/12/20   [name], [name], MD  glucose blood (ACCU-CHEK GUIDE) test strip Use as instructed 04/04/20   [name],  [name], MD  hydrochlorothiazide (HYDRODIURIL) 25 MG tablet TAKE ONE TABLET BY MOUTH ONCE DAILY 11/14/20   [name], [name], MD  HYDROcodone-acetaminophen (NORCO/VICODIN) 5-325 MG tablet Take 1-2 tablets by mouth every 6 (six) hours as needed for severe pain or moderate pain ((score 7 to 10)). 12/04/20   [name], [name], MD  HYDROcodone-acetaminophen (NORCO/VICODIN) 5-325 MG tablet Take 1 tablet by mouth every 6 (six) hours as needed for moderate pain. 12/04/20   [name], [name], MD  HYDROcodone-acetaminophen (NORCO/VICODIN) 5-325 MG tablet Take 1 tablet by mouth every 6 (six) hours as needed for moderate pain. 12/04/20   [name], [name], MD  lactulose (CONSTULOSE) 10 GM/15ML solution TAKE 30 MILLILITERS BY MOUTH  THREE TIMES DAILY AS NEEDED FOR MILD CONSTIPATION Patient taking differently: Take 20 g by mouth 3 (three) times daily as needed for moderate constipation. 08/15/20   [name], [name], MD  lidocaine (LIDODERM) 5 % Place 1 patch onto the skin daily. Remove & Discard patch within 12 hours or as directed by MD 02/05/20   Quintella Reichert, MD  linaclotide [facility]) 145 MCG CAPS capsule Take 1 capsule (145 mcg total) by mouth daily before breakfast. 12/16/20   Danis, Kirke Corin, MD  losartan (COZAAR) 100 MG tablet TAKE ONE TABLET BY MOUTH EVERY EVENING 11/14/20   [name], [name], MD  metFORMIN (GLUCOPHAGE-XR) 500 MG 24 hr tablet TAKE ONE TABLET BY MOUTH EVERY MORNING 01/10/21   [name], [name], MD  Pirfenidone (ESBRIET) 801 MG TABS Take 1 tablet by mouth 3 (three) times daily. Patient taking differently: Take 801 mg by mouth 3 (three) times daily. 05/01/20   Mannam, Hart Robinsons, MD  polyethylene glycol powder (GLYCOLAX/MIRALAX) powder Take 17 g by mouth daily. 06/09/18   [name], [name], MD  senna (SENOKOT) 8.6 MG TABS tablet Take 1 tablet by mouth daily as needed for mild constipation.    [provider]  XARELTO 20  MG TABS tablet TAKE ONE TABLET BY MOUTH ONCE DAILY WITH SUPPER 11/14/20   [name], [name], MD      Allergies    Fish oil, Other, Penicillins, Pneumococcal vaccines, Cashew nut oil, Nitrofurantoin macrocrystal, Peanut oil, Aspirin, Bactrim [sulfamethoxazole-trimethoprim], Ciprofloxacin, Ibuprofen, Influenza vaccines, Ivp dye [iodinated contrast media], Latex, Macrobid [nitrofurantoin monohydrate macrocrystals], and Shellfish allergy    Review of Systems   Review of Systems  Respiratory:  Positive for shortness of breath.   Cardiovascular:  Positive for chest pain.   Physical Exam Updated Vital Signs BP (!) 107/48    Pulse (!) 52    Temp 98.7 F (37.1 C) (Oral)    Resp 15    Ht 5\' 5"  (1.651 m)    Wt 74.8 kg    SpO2 100%    BMI 27.46 kg/m  Physical Exam Vitals and nursing note reviewed.  Constitutional:      General:  She is not in acute distress.    Comments: Mildly uncomfortable 76 year old female nontoxic Pleasant, able answer questions appropriately  HENT:     Head: Normocephalic and atraumatic.     Nose: Nose normal.  Eyes:     General: No scleral icterus. Cardiovascular:     Rate and Rhythm: Normal rate and regular rhythm.     Pulses: Normal pulses.     Heart sounds: Murmur heard.     Comments: Bilateral radial artery pulses 3+ and symmetric Pulmonary:     Effort: Pulmonary effort is normal. No respiratory distress.     Breath sounds: No wheezing.  Abdominal:     Palpations: Abdomen is soft.     Tenderness: There is no abdominal tenderness.  Musculoskeletal:     Cervical back: Normal range of motion.     Right lower leg: No edema.     Left lower leg: No edema.  Skin:    General: Skin is warm and dry.     Capillary Refill: Capillary refill takes less than 2 seconds.  Neurological:     Mental Status: She is alert. Mental status is at baseline.  Psychiatric:        Mood and Affect: Mood normal.        Behavior: Behavior normal.    ED Results / Procedures /  Treatments   Labs (all labs ordered are listed, but only abnormal results are displayed) Labs Reviewed  COMPREHENSIVE METABOLIC PANEL - Abnormal; Notable for the following components:      Result Value   Potassium 2.9 (*)    Chloride 92 (*)    Glucose, Bld 117 (*)    BUN 24 (*)    Creatinine, Ser 2.44 (*)    GFR, Estimated 20 (*)    Anion gap 16 (*)    All other components within normal limits  TROPONIN I (HIGH SENSITIVITY) - Abnormal; Notable for the following components:   Troponin I (High Sensitivity) 24 (*)    All other components within normal limits  TROPONIN I (HIGH SENSITIVITY) - Abnormal; Notable for the following components:   Troponin I (High Sensitivity) 21 (*)    All other components within normal limits  RESP PANEL BY RT-PCR (FLU A&B, COVID) ARPGX2  CBC  BRAIN NATRIURETIC PEPTIDE  LIPID PANEL  MAGNESIUM  URINALYSIS, ROUTINE W REFLEX MICROSCOPIC  CREATININE, URINE, RANDOM  UREA NITROGEN, URINE    EKG EKG Interpretation  Date/Time:  Monday April 14 2021 06:01:22 EST Ventricular Rate:  73 PR Interval:  148 QRS Duration: 74 QT Interval:  422 QTC Calculation: 464 R Axis:   21 Text Interpretation: Normal sinus rhythm Cannot rule out Anterior infarct , age undetermined Abnormal ECG When compared with ECG of 05-Feb-2020 11:38, PREVIOUS ECG IS PRESENT No acute changes No significant change since last tracing Confirmed by Varney Biles ([id number]) on 04/14/2021 8:12:03 AM  Radiology DG Chest 2 View  Result Date: 04/14/2021 CLINICAL DATA:  Chest pain. EXAM: CHEST - 2 VIEW COMPARISON:  Chest XRs, most recently 02/05/2020.  CT chest, 10 1. FINDINGS: Cardiomediastinal silhouette is within normal limits. Aortic atherosclerosis. Mild hyperinflation. Similar appearance of traction bronchiectasis and peribronchovascular opacities, greatest at the LEFT lung base. No pleural effusion or pneumothorax. ACDF, incompletely imaged. LEFT upper extremity glucose monitor. No acute  displaced fracture. IMPRESSION: Chronic fibrotic lung change. No discrete acute superimposed cardiopulmonary process. Electronically Signed   By: Michaelle Birks M.D.   On: 04/14/2021 06:25    Procedures Procedures  Medications Ordered in ED Medications  fentaNYL (SUBLIMAZE) injection 50 mcg (50 mcg Intravenous Given 04/14/21 0805)  sodium chloride flush (NS) 0.9 % injection 3 mL (has no administration in time range)  potassium chloride 10 mEq in 100 mL IVPB (has no administration in time range)  acetaminophen (TYLENOL) tablet 650 mg (has no administration in time range)    Or  acetaminophen (TYLENOL) suppository 650 mg (has no administration in time range)  ondansetron (ZOFRAN) tablet 4 mg (has no administration in time range)    Or  ondansetron (ZOFRAN) injection 4 mg (has no administration in time range)  0.9 %  sodium chloride infusion (has no administration in time range)  ondansetron (ZOFRAN) injection 4 mg (4 mg Intravenous Given 04/14/21 0801)  potassium chloride SA (KLOR-CON M) CR tablet 40 mEq (40 mEq Oral Given 04/14/21 0924)  lactated ringers bolus 1,000 mL (1,000 mLs Intravenous New Bag/Given 04/14/21 9417)    ED Course/ Medical Decision Making/ A&P Clinical Course as of 04/14/21 1001  Mon Apr 14, 2021  4081 Int CP for 2 days (started Saturday). 1am this morning most recent start of CP. Some SOB, LH, nausea. Rads to left shoulder.  [WF]  [phone] Discussed w/ cardiology PA who states cardiology team will evaluate.  [WF]  [phone] Discussed with Dr. Tamala Julian who will admit to medicine. [WF]    Clinical Course User Index [WF] Tedd Sias, PA                           Medical Decision Making Amount and/or Complexity of Data Reviewed Labs: ordered. Radiology: ordered.  Risk Prescription drug management. Decision regarding hospitalization.   This patient presents to the ED for concern of chest pain, this involves a number of treatment options, and is a complaint that carries  with it a high risk of complications and morbidity.  The differential diagnosis includes The emergent causes of chest pain include: Acute coronary syndrome, tamponade, pericarditis/myocarditis, aortic dissection, pulmonary embolism, tension pneumothorax, pneumonia, and esophageal rupture.   Co morbidities: Discussed in HPI   Brief History:  Patient is a 76 year old female with a past medical history notable for HTN, DM2, dyslipidemia, OSA, coronary artery calcifications seen on CT scan, interstitial lung disease, LVH, A. fib, VTE on DOAC  Patient is presented to emergency room today with complaints of chest pain she states that it is sternal radiating to her left shoulder she is uncertain whether it is exertional or not.  Seems to be constant since 1 AM this morning but is been intermittent over the past 2 days.  She states that she does not have a prescription for nitroglycerin does not typically suffer from chest pain.  She states that she has never had a heart catheterization done she does see a cardiologist here at with [facility].  Does endorse some nausea and states that she was diaphoretic this morning.  Denies any cough hemoptysis but does endorse mild rhinorrhea.  Denies any leg swelling unilateral or bilateral.  Denies any fevers lightheadedness or dizziness.  EMR reviewed including pt PMHx, past surgical history and past visits to ER.   See HPI for more details   Lab Tests:  I ordered and independently interpreted labs.  The pertinent results include:    Labs notable for mild elevation in troponin.  May be secondary to AKI.  Notable AKI with significant elevation in creatinine BUN somewhat elevated BUN/creatinine ratio indiscriminate zone.  May be prerenal  due to decreased intake.  BMP unremarkable no lower extremity edema to corroborate any kind of CHF.  No history of such.  CBC without leukocytosis or anemia RVP negative.   Imaging Studies:  Abnormal findings. I personally  reviewed all imaging studies. Imaging notable for Scarring.  No acute abnormality.   Cardiac Monitoring:    Medicines ordered:  I ordered medication including fentanyl, LR, potassium, Zofran for chest pain, dehydration, electrolyte derangements Reevaluation of the patient after these medicines showed that the patient improved I have reviewed the patients home medicines and have made adjustments as needed   Critical Interventions:  Specialist consultation with cardiology   Consults:  I requested consultation with cardiology,  and discussed lab and imaging findings as well as pertinent plan - they recommend: Admission    Reevaluation:  After the interventions noted above I re-evaluated patient and found that they have :improved   Social Determinants of Health:  The patient's social determinants of health were a factor in the care of this patient    Problem List / ED Course:  Chest pain, AKI   Dispostion:  After consideration of the diagnostic results and the patients response to treatment, I feel that the patent would benefit from admission    Final Clinical Impression(s) / ED Diagnoses Final diagnoses:  Chest pain, unspecified type  AKI (acute kidney injury) [facility])    Rx / DC Orders ED Discharge Orders     None         Tedd Sias, [place] 04/14/21 [place], MD 04/15/21 1639

## 2021-04-14 NOTE — Plan of Care (Signed)

## 2021-04-14 NOTE — Plan of Care (Signed)

## 2021-04-14 NOTE — Consult Note (Signed)
Cardiology Consultation:   Patient ID: Miranda [name] MRN: [id number]; DOB: 1945-10-02  Admit date: 04/14/2021 Date of Consult: 04/14/2021  PCP:  Miranda [name], Miranda Halsted, MD   [facility] Providers Cardiologist:  Miranda [name], MD  Electrophysiologist:  Miranda Haw, MD  {   Patient Profile:   Miranda [name] is a 76 y.o. female with a hx of PAF on Multaq & Xarelto, coronary calcification on CT, OSA on CPAP, HTN, HLD, DM, depression, hx of pulmonary embolism and GERD who is being seen 04/14/2021 for the evaluation of chest pain/elevated troponin at the request of Miranda Gallo, PA-C.  Patient was diagnosed with acute PE in February 2018 and was treated with 34-month course of Xarelto.  High-resolution CT obtained in February 2018 suggested interstitial lung disease as well as two-vessel CAD.  Repeat CT in July 2019 showed resolution of PE.  Patient was diagnosed with atrial fibrillation with RVR while undergoing colonoscopy in July 2019. Prior hx of bradycardia/fatigue on metoprolol which was resolved with discontinuation.   Last echo 10/2017 showed LVEF of 60-65% and grade 1 DD. No regional WM abnormality.  Last stress test 10/2017 was low risk study.   History of Present Illness:   Miranda [name] presented for evaluation of palpitation and chest pain.  She reported few months history of progressive worsening palpitation associated with chest tightness.  Patient reports she cannot function due to this episode.  Each episode lasting for 10 minutes or so which gets better with laying down.  She feels fatigue and tired afterwards.  At 1 point her heart rate was in 180s during this episode.  She was feeling fatigue will palpitation last night while went to bed.  However, woke up around 3 AM with worsening symptoms and came to ER for further evaluation.  Patient was found to have elevated creatinine at 2.44 with potassium of 2.9.  She was given bolus of fluid and now on drip.  Patient  reports poor appetite lately.  No fever or chills.  She sleeps on 2 pillows chronically.  No history of tobacco smoking  Patient has 2 teeth pulled out on 1/13.  Complicated by "liver clot formation" and underwent removal with holding Xarelto on 04/01/2021.  K 2.9 Scr 2.44  Hs-troponin 24>>21 BNP 42 Chest x-ray with chronic fibrotic changes.   Past Medical History:  Diagnosis Date   Anxiety    Arthritis    Asthma    Atrial fibrillation (HCC)    CHF (congestive heart failure) ([place])    pt. unsure- but thinks she was hosp. for CHF- 2002   Chronic kidney disease    [facility]   Clotting disorder ([place])    blood clots in lungs/PE pulmonary embolism   Colon polyps    Complication of anesthesia    states requires a lot med. to put her to sleep    DDD (degenerative disc disease) 09/17/2011   Depression    "sometimes "   DM (diabetes mellitus) ([place])    Family history of anesthesia complication    Sister had difficulty waking up from anesthesia   GERD (gastroesophageal reflux disease)    Glaucoma    bilateral, pt. admits that she is noncompliant to eye gtts.    Hemorrhoids    Hyperlipidemia    Hypertension    had stress, echo- 2006 /w [place], Cardiac Cath, per pt. 2002, echo repeated 2012- wnl    IPF (idiopathic pulmonary fibrosis) ([place])    Low back pain  Shortness of breath    Sleep apnea    uses c-pap- q night recently    Past Surgical History:  Procedure Laterality Date   ABDOMINAL HYSTERECTOMY     ectopic, fibroids   ANTERIOR CERVICAL DECOMP/DISCECTOMY FUSION N/A 09/11/2020   Procedure: Anterior Cervical Decompression Fusion Cervical four-five, Cervical five-six;  Surgeon: Miranda Mare, MD;  Location: [facility];  Service: Neurosurgery;  Laterality: N/A;   arm surgery Right    CARDIAC CATHETERIZATION     CATARACT EXTRACTION, BILATERAL     cataracts removed bilateral- ?IOL   COLONOSCOPY     remote   FISSURECTOMY  10/08/2011   Procedure: FISSURECTOMY;   Surgeon: Miranda Klein, MD;  Location: [place] [facility];  Service: General;  Laterality: N/A;   FLEXIBLE SIGMOIDOSCOPY  02/25/2011   Procedure: FLEXIBLE SIGMOIDOSCOPY;  Surgeon: Miranda Castle, MD;  Location: [facility];  Service: Endoscopy;  Laterality: N/A;   FOOT SURGERY     bilat, heel spurs- screw in R foot    HEMORRHOID SURGERY  10/08/2011   Procedure: HEMORRHOIDECTOMY;  Surgeon: Miranda Klein, MD;  Location: [facility];  Service: General;  Laterality: N/A;  External    ROTATOR CUFF REPAIR Right    SPHINCTEROTOMY  10/08/2011   Procedure: Miranda [name];  Surgeon: Miranda Klein, MD;  Location: [facility];  Service: General;  Laterality: N/A;    Inpatient Medications: Scheduled Meds:  sodium chloride flush  3 mL Intravenous Q12H   Continuous Infusions:  sodium chloride     potassium chloride     PRN Meds: acetaminophen **OR** acetaminophen, fentaNYL (SUBLIMAZE) injection, ondansetron **OR** ondansetron (ZOFRAN) IV  Allergies:    Allergies  Allergen Reactions   Fish Oil Anaphylaxis   Other Hives, Shortness Of Breath and Swelling    Allergic to cashew nuts and peanut oil.   Penicillins Anaphylaxis, Hives and Swelling    Has patient had a PCN reaction causing immediate rash, facial/tongue/throat swelling, SOB or lightheadedness with hypotension: Face swelling and hives started first, then swelling of the throat  Has patient had a PCN reaction causing severe rash involving mucus membranes or skin necrosis: Yes  Has patient had a PCN reaction that required hospitalization: No  Has patient had a PCN reaction occurring within the last 10 years: Yes  If all of the above answers are "NO", then may proceed with Cephalospor   Pneumococcal Vaccines Nausea And Vomiting   Cashew Nut Oil    Nitrofurantoin Macrocrystal Hives   Peanut Oil    Aspirin Itching and Rash   Bactrim [Sulfamethoxazole-Trimethoprim] Hives, Itching and Rash   Ciprofloxacin Hives, Itching and Rash   Ibuprofen Rash   Influenza  Vaccines Hives   Ivp Dye [Iodinated Contrast Media] Hives, Itching and Rash    Gives benadryl to counteract symptoms   Latex Rash   Macrobid [Nitrofurantoin Monohydrate Macrocrystals] Hives   Shellfish Allergy Hives    Patient also allergic to seafood    Social History:   Social History   Socioeconomic History   Marital status: Married    Spouse name: Not on file   Number of children: 4   Years of education: Not on file   Highest education level: Not on file  Occupational History   Occupation: retired Forensic psychologist: UNEMPLOYED  Tobacco Use   Smoking status: Never   Smokeless tobacco: Never  Vaping Use   Vaping Use: Never used  Substance and Sexual Activity   Alcohol use: No    Alcohol/week: 0.0 standard drinks  Drug use: No   Sexual activity: Never  Other Topics Concern   Not on file  Social History Narrative   Not on file   Social Determinants of Health   Financial Resource Strain: Not on file  Food Insecurity: Not on file  Transportation Needs: Not on file  Physical Activity: Not on file  Stress: Not on file  Social Connections: Not on file  Intimate Partner Violence: Not on file    Family History:   Family History  Problem Relation Age of Onset   Heart attack Mother    Heart disease Mother    Breast cancer Mother    Emphysema Sister    Breast cancer Sister    Arthritis/Rheumatoid Sister    Asthma Sister    Lung cancer Sister    COPD Sister    Colon cancer Brother    Colon cancer Brother    Anesthesia problems Neg Hx    Esophageal cancer Neg Hx    Rectal cancer Neg Hx    Stomach cancer Neg Hx    ROS:  Please see the history of present illness.  All other ROS reviewed and negative.     Physical Exam/Data:   Vitals:   04/14/21 0744 04/14/21 0815 04/14/21 0830 04/14/21 0900  BP: (!) 110/48 113/60 (!) 121/48 (!) 107/48  Pulse: (!) 52     Resp: 16 18 14 15   Temp: 98.7 F (37.1 C)     TempSrc: Oral     SpO2: 100% 100%    Weight:       Height:       No intake or output data in the 24 hours ending 04/14/21 1036 Last 3 Weights 04/14/2021 03/14/2021 01/20/2021  Weight (lbs) 165 lb 175 lb 172 lb  Weight (kg) 74.844 kg 79.379 kg 78.019 kg     Body mass index is 27.46 kg/m.  General:  Well nourished, well developed, in no acute distress HEENT: normal Neck: no JVD Vascular: No carotid bruits; Distal pulses 2+ bilaterally Cardiac:  normal S1, S2; RRR; 2/6 systolic murmur  Lungs:  clear to auscultation bilaterally, no wheezing, rhonchi or rales  Abd: soft, nontender, no hepatomegaly  Ext: no edema Musculoskeletal:  No deformities, BUE and BLE strength normal and equal Skin: warm and dry  Neuro:  CNs 2-12 intact, no focal abnormalities noted Psych:  Normal affect   EKG:  The EKG was personally reviewed and demonstrates:  Sinus rhythm  Telemetry:  Telemetry was personally reviewed and demonstrates:  Sinus bradycardia in 50s  Relevant CV Studies:  Stress test 10/2017 Nuclear stress EF: 66%. There was no ST segment deviation noted during stress. No T wave inversion was noted during stress. The study is normal. This is a low risk study. The left ventricular ejection fraction is hyperdynamic (>65%).  Echo 10/2017 Study Conclusions   - Left ventricle: The cavity size was normal. Systolic function was    normal. The estimated ejection fraction was in the range of 60%    to 65%. Wall motion was normal; there were no regional wall    motion abnormalities. Doppler parameters are consistent with    abnormal left ventricular relaxation (grade 1 diastolic    dysfunction).  - Aortic valve: There was no significant regurgitation.  - Mitral valve: There was trivial regurgitation.  - Left atrium: The atrium was mildly dilated.  - Right ventricle: The cavity size was mildly dilated. Wall    thickness was normal.  - Tricuspid valve: There was  trivial regurgitation.  - Pulmonic valve: There was trivial regurgitation.  - Inferior  vena cava: The vessel was normal in size. The    respirophasic diameter changes were in the normal range (>= 50%),    consistent with normal central venous pressure.   Impressions:   - Normal LV systolic function. No significant valvular disease.   Laboratory Data:  High Sensitivity Troponin:   Recent Labs  Lab 04/14/21 0600 04/14/21 0801  TROPONINIHS 24* 21*     Chemistry Recent Labs  Lab 04/14/21 0600  NA 136  K 2.9*  CL 92*  CO2 28  GLUCOSE 117*  BUN 24*  CREATININE 2.44*  CALCIUM 9.9  GFRNONAA 20*  ANIONGAP 16*    Recent Labs  Lab 04/14/21 0600  PROT 8.0  ALBUMIN 4.1  AST 26  ALT 21  ALKPHOS 39  BILITOT 0.6    Hematology Recent Labs  Lab 04/14/21 0600  WBC 7.0  RBC 4.51  HGB 13.0  HCT 38.2  MCV 84.7  MCH 28.8  MCHC 34.0  RDW 13.2  PLT 216   BNP Recent Labs  Lab 04/14/21 0600  BNP 42.9    Radiology/Studies:  DG Chest 2 View  Result Date: 04/14/2021 CLINICAL DATA:  Chest pain. EXAM: CHEST - 2 VIEW COMPARISON:  Chest XRs, most recently 02/05/2020.  CT chest, 10 1. FINDINGS: Cardiomediastinal silhouette is within normal limits. Aortic atherosclerosis. Mild hyperinflation. Similar appearance of traction bronchiectasis and peribronchovascular opacities, greatest at the LEFT lung base. No pleural effusion or pneumothorax. ACDF, incompletely imaged. LEFT upper extremity glucose monitor. No acute displaced fracture. IMPRESSION: Chronic fibrotic lung change. No discrete acute superimposed cardiopulmonary process. Electronically Signed   By: Michaelle Birks M.D.   On: 04/14/2021 06:25     Assessment and Plan:   Chest pain Palpitation Elevated troponin  - Based on history seems her chest pain is secondary to heart palpitation.  At 1 time her heart rate was in 180s while having chest pain with palpitation.  Had worse palpitation last night leading to ER evaluation.  Suspect A. fib RVR with this episode.  She does have underlying CAD with coronary  calcification on CT with negative stress test in 2016 and 2019.  Patient has noted exertional fatigue.  EKG without acute ischemic changes.  Troponin not consistent with ACS.  This is due to demand ischemia from AKI. -Her last night episode could be due to dehydration and low potassium.  Correct electrolyte abnormality. -Get echocardiogram -Check TSH  4.  Interstitial lung disease -Per primary team -No active wheezing -Denies prior history of tobacco smoking but has significant secondary smoke exposure  5.  Paroxysmal atrial fibrillation -Suspect elevated heart rate at times with symptoms -Recently had Xarelto last week due to teeth infection -Currently sinus bradycardic in 50s -Continue Multaq -Continue Xarelto  6.  AKI -Reports poor p.o. intake recently -Hydration per primary team -Avoid nephrotoxic agent  7.  Hypertension -Blood pressure stable -Hold home losartan/hydrochlorothiazide/Lasix.  8. DM - Per primary team   9.  Hypokalemia -Supplemented  Risk Assessment/Risk Scores:     HEAR Score (for undifferentiated chest pain):  HEAR Score: 5    CHA2DS2-VASc Score = 5   This indicates a 7.2% annual risk of stroke. The patient's score is based upon: CHF History: 0 HTN History: 1 Diabetes History: 1 Stroke History: 0 Vascular Disease History: 1 Age Score: 1 Gender Score: 1         For questions or updates, please  contact [place] Please consult [email or web address] for contact info under    Jarrett Soho, PA  04/14/2021 10:36 AM

## 2021-04-14 NOTE — ED Triage Notes (Signed)
Pt presents to the ED from home with complaints of CP, radiating to jaw, with SOB onset yesterday. Pt states she has had a cough, denies fever, chills, congestion.

## 2021-04-14 NOTE — Progress Notes (Signed)
Echocardiogram 2D Echocardiogram has been performed.  Miranda [name] 04/14/2021, 1:58 PM

## 2021-04-15 ENCOUNTER — Telehealth: Payer: Self-pay | Admitting: Pharmacist

## 2021-04-15 DIAGNOSIS — E440 Moderate protein-calorie malnutrition: Secondary | ICD-10-CM | POA: Insufficient documentation

## 2021-04-15 LAB — CBC
HCT: 31.3 % — ABNORMAL LOW (ref 36.0–46.0)
Hemoglobin: 10.6 g/dL — ABNORMAL LOW (ref 12.0–15.0)
MCH: 28.9 pg (ref 26.0–34.0)
MCHC: 33.9 g/dL (ref 30.0–36.0)
MCV: 85.3 fL (ref 80.0–100.0)
Platelets: 151 10*3/uL (ref 150–400)
RBC: 3.67 MIL/uL — ABNORMAL LOW (ref 3.87–5.11)
RDW: 13.2 % (ref 11.5–15.5)
WBC: 4.9 10*3/uL (ref 4.0–10.5)
nRBC: 0 % (ref 0.0–0.2)

## 2021-04-15 LAB — BASIC METABOLIC PANEL
Anion gap: 8 (ref 5–15)
BUN: 15 mg/dL (ref 8–23)
CO2: 27 mmol/L (ref 22–32)
Calcium: 8.4 mg/dL — ABNORMAL LOW (ref 8.9–10.3)
Chloride: 101 mmol/L (ref 98–111)
Creatinine, Ser: 1.15 mg/dL — ABNORMAL HIGH (ref 0.44–1.00)
GFR, Estimated: 50 mL/min — ABNORMAL LOW (ref >60)
Glucose, Bld: 88 mg/dL (ref 70–99)
Potassium: 2.8 mmol/L — ABNORMAL LOW (ref 3.5–5.1)
Sodium: 136 mmol/L (ref 135–145)

## 2021-04-15 LAB — GLUCOSE, CAPILLARY
Glucose-Capillary: 85 mg/dL (ref 70–99)
Glucose-Capillary: 102 mg/dL — ABNORMAL HIGH (ref 70–99)
Glucose-Capillary: 100 mg/dL — ABNORMAL HIGH (ref 70–99)
Glucose-Capillary: 115 mg/dL — ABNORMAL HIGH (ref 70–99)

## 2021-04-15 MED ORDER — BISACODYL 10 MG RE SUPP
10.0000 mg | Freq: Once | RECTAL | Status: AC
  Administered 2021-04-15: 10 mg via RECTAL
  Filled 2021-04-15: qty 1

## 2021-04-15 MED ORDER — POTASSIUM CHLORIDE CRYS ER 20 MEQ PO TBCR
60.0000 meq | EXTENDED_RELEASE_TABLET | ORAL | Status: AC
  Administered 2021-04-15: 60 meq via ORAL
  Filled 2021-04-15: qty 3

## 2021-04-15 MED ORDER — ADULT MULTIVITAMIN W/MINERALS CH
1.0000 | ORAL_TABLET | Freq: Every day | ORAL | Status: DC
  Administered 2021-04-15 – 2021-04-17 (×2): 1 via ORAL
  Filled 2021-04-15 (×2): qty 1

## 2021-04-15 MED ORDER — POTASSIUM CHLORIDE 2 MEQ/ML IV SOLN
INTRAVENOUS | Status: AC
  Filled 2021-04-15: qty 1000

## 2021-04-15 MED ORDER — LINACLOTIDE 145 MCG PO CAPS
145.0000 ug | ORAL_CAPSULE | Freq: Every day | ORAL | Status: DC
  Administered 2021-04-15 – 2021-04-17 (×3): 145 ug via ORAL
  Filled 2021-04-15 (×4): qty 1

## 2021-04-15 MED ORDER — CALCIUM GLUCONATE-NACL 1-0.675 GM/50ML-% IV SOLN
1.0000 g | Freq: Once | INTRAVENOUS | Status: AC
  Administered 2021-04-15: 1000 mg via INTRAVENOUS
  Filled 2021-04-15: qty 50

## 2021-04-15 MED ORDER — RIVAROXABAN 20 MG PO TABS
20.0000 mg | ORAL_TABLET | Freq: Every day | ORAL | Status: DC
  Administered 2021-04-15 – 2021-04-16 (×2): 20 mg via ORAL
  Filled 2021-04-15 (×2): qty 1

## 2021-04-15 MED ORDER — POLYETHYLENE GLYCOL 3350 17 G PO PACK
17.0000 g | PACK | Freq: Every day | ORAL | Status: DC
  Administered 2021-04-15 – 2021-04-17 (×2): 17 g via ORAL
  Filled 2021-04-15 (×2): qty 1

## 2021-04-15 MED ORDER — ENSURE ENLIVE PO LIQD
237.0000 mL | Freq: Three times a day (TID) | ORAL | Status: DC
  Administered 2021-04-15 – 2021-04-17 (×5): 237 mL via ORAL

## 2021-04-15 NOTE — Evaluation (Signed)
Physical Therapy Evaluation Patient Details Name: Miranda [name] MRN: [id number] DOB: 02-14-1946 Today's Date: 04/15/2021  History of Present Illness  Pt adm 1/30 with fatigue, heart racing and chest discomfort. Pt found to have acute renal failure due to dehydration. Cardiology consulted and suspect more afib with rvr due to dehydration and hypokalemia. PMH - cervivcal ACDF, afib, PE, HTN, chf, depression.  Clinical Impression  Pt doing well with mobility and no further PT needed.  Ready for dc from PT standpoint.        Recommendations for follow up therapy are one component of a multi-disciplinary discharge planning process, led by the attending physician.  Recommendations may be updated based on patient status, additional functional criteria and insurance authorization.  Follow Up Recommendations No PT follow up    Assistance Recommended at Discharge None  Patient can return home with the following       Equipment Recommendations None recommended by PT  Recommendations for Other Services       Functional Status Assessment Patient has not had a recent decline in their functional status     Precautions / Restrictions Precautions Precautions: None      Mobility  Bed Mobility Overal bed mobility: Modified Independent                  Transfers Overall transfer level: Modified independent Equipment used: None                    Ambulation/Gait Ambulation/Gait assistance: Supervision Gait Distance (Feet): 225 Feet Assistive device: None, IV Pole Gait Pattern/deviations: Step-through pattern, Decreased stride length Gait velocity: decr Gait velocity interpretation: 1.31 - 2.62 ft/sec, indicative of limited community ambulator   General Gait Details: Assist for lines. Initially slightly hesitant but with incr distance more confidence. No loss of balance.  Stairs            Wheelchair Mobility    Modified Rankin (Stroke Patients Only)        Balance Overall balance assessment: Mild deficits observed, not formally tested                                           Pertinent Vitals/Pain Pain Assessment Pain Assessment: No/denies pain    Home Living Family/patient expects to be discharged to:: Private residence Living Arrangements: Alone Available Help at Discharge: Family;Available 24 hours/day Type of Home: House Home Access: Stairs to enter Entrance Stairs-Rails: Right Entrance Stairs-Number of Steps: 3   Home Layout: One level Home Equipment: Conservation officer, nature (2 wheels);Rollator (4 wheels);Cane - single point      Prior Function Prior Level of Function : Independent/Modified Independent;Driving             Mobility Comments: Not using assistive device       Hand Dominance   Dominant Hand: Right    Extremity/Trunk Assessment   Upper Extremity Assessment Upper Extremity Assessment: Overall WFL for tasks assessed    Lower Extremity Assessment Lower Extremity Assessment: Overall WFL for tasks assessed       Communication   Communication: No difficulties  Cognition Arousal/Alertness: Awake/alert Behavior During Therapy: WFL for tasks assessed/performed Overall Cognitive Status: Within Functional Limits for tasks assessed  General Comments General comments (skin integrity, edema, etc.): VSS    Exercises     Assessment/Plan    PT Assessment Patient does not need any further PT services  PT Problem List         PT Treatment Interventions      PT Goals (Current goals can be found in the Care Plan section)  Acute Rehab PT Goals PT Goal Formulation: All assessment and education complete, DC therapy    Frequency       Co-evaluation               AM-PAC PT "6 Clicks" Mobility  Outcome Measure Help needed turning from your back to your side while in a flat bed without using bedrails?: None Help needed  moving from lying on your back to sitting on the side of a flat bed without using bedrails?: None Help needed moving to and from a bed to a chair (including a wheelchair)?: None Help needed standing up from a chair using your arms (e.g., wheelchair or bedside chair)?: None Help needed to walk in hospital room?: None Help needed climbing 3-5 steps with a railing? : None 6 Click Score: 24    End of Session   Activity Tolerance: Patient tolerated treatment well Patient left: in chair;with call bell/phone within reach   PT Visit Diagnosis: Other abnormalities of gait and mobility (R26.89)    Time: 1436-1450 PT Time Calculation (min) (ACUTE ONLY): 14 min   Charges:   PT Evaluation $PT Eval Low Complexity: 1 Low          [place] Pager [phone] Office [place] 04/15/2021, 3:23 PM

## 2021-04-15 NOTE — Chronic Care Management (AMB) (Signed)
Chronic Care Management Pharmacy Assistant   Name: Miranda [name]  MRN: [id number] DOB: December 22, 1945  Reason for Encounter: Medication Review Medication Coordination   Recent office visits:  None  Recent consult visits:  03/20/21 Miranda [name] D (Rehab and Phys Med) - Patient presented for Lumbar Radiculopathy Injection. No medication changes noted.  Hospital visits:  Medication Reconciliation was completed by comparing discharge summary, patients EMR and Pharmacy list, and upon discussion with patient.  Patient presented to [facility] on 04/14/21 due to Chest Pain. Patient has not been discharged yet. 04/16/21 LG  Medications: Facility-Administered Encounter Medications as of 04/15/2021  Medication   acetaminophen (TYLENOL) tablet 650 mg   Or   acetaminophen (TYLENOL) suppository 650 mg   ALPRAZolam (XANAX) tablet 0.25 mg   amLODipine (NORVASC) tablet 10 mg   atorvastatin (LIPITOR) tablet 80 mg   calcium gluconate 1 g/ 50 mL sodium chloride IVPB   cyclobenzaprine (FLEXERIL) tablet 5 mg   dronedarone (MULTAQ) tablet 400 mg   feeding supplement (ENSURE ENLIVE / ENSURE PLUS) liquid 237 mL   fentaNYL (SUBLIMAZE) injection 50 mcg   HYDROcodone-acetaminophen (NORCO) 10-325 MG per tablet 1 tablet   ondansetron (ZOFRAN) tablet 4 mg   Or   ondansetron (ZOFRAN) injection 4 mg   Pirfenidone TABS 801 mg   Rivaroxaban (XARELTO) tablet 15 mg   sodium chloride flush (NS) 0.9 % injection 3 mL   Outpatient Encounter Medications as of 04/15/2021  Medication Sig   Accu-Chek Softclix Lancets lancets Use as instructed   acetaminophen (TYLENOL) 325 MG tablet Take 650 mg by mouth every 6 (six) hours as needed for moderate pain.   albuterol (VENTOLIN HFA) 108 (90 Base) MCG/ACT inhaler Inhale 1 puff into the lungs every 6 (six) hours as needed. (Patient not taking: Reported on 04/14/2021)   ALPRAZolam (XANAX) 0.25 MG tablet TAKE ONE TABLET BY MOUTH three times daily AS NEEDED  (Patient taking differently: Take 0.25 mg by mouth 3 (three) times daily as needed for anxiety.)   amLODipine (NORVASC) 10 MG tablet TAKE ONE TABLET BY MOUTH ONCE DAILY (Patient taking differently: Take 10 mg by mouth daily.)   atorvastatin (LIPITOR) 80 MG tablet TAKE ONE TABLET BY MOUTH EVERY EVENING (Patient taking differently: Take 80 mg by mouth every evening.)   Continuous Blood Gluc Receiver (FREESTYLE LIBRE 14 DAY READER) DEVI 1 each by Does not apply route daily.   Continuous Blood Gluc Sensor (FREESTYLE LIBRE 14 DAY SENSOR) MISC USE 1  ONCE DAILY   cyclobenzaprine (FLEXERIL) 5 MG tablet TAKE ONE TABLET BY MOUTH EVERY EVENING (Patient taking differently: Take 5 mg by mouth at bedtime.)   diclofenac sodium (VOLTAREN) 1 % GEL Apply 2 g topically 4 (four) times daily. (Patient not taking: Reported on 04/14/2021)   dronedarone (MULTAQ) 400 MG tablet TAKE 1 TABLET BY MOUTH TWICE DAILY WITH A MEAL (Patient taking differently: Take 400 mg by mouth 2 (two) times daily with a meal.)   ezetimibe (ZETIA) 10 MG tablet TAKE ONE TABLET BY MOUTH ONCE DAILY (Patient taking differently: Take 10 mg by mouth daily.)   furosemide (LASIX) 20 MG tablet Take 20 mg by mouth every morning.   glucose blood (ACCU-CHEK AVIVA PLUS) test strip Use once daily for glucose control.  Dx E11.9  For accu-chek aviva   glucose blood (ACCU-CHEK GUIDE) test strip Use as instructed   hydrochlorothiazide (HYDRODIURIL) 25 MG tablet TAKE ONE TABLET BY MOUTH ONCE DAILY (Patient taking differently: Take 25 mg by  mouth daily.)   HYDROcodone-acetaminophen (NORCO) 10-325 MG tablet Take 1 tablet by mouth every 6 (six) hours as needed for pain.   lactulose (CONSTULOSE) 10 GM/15ML solution TAKE 30 MILLILITERS BY MOUTH  THREE TIMES DAILY AS NEEDED FOR MILD CONSTIPATION (Patient not taking: Reported on 04/14/2021)   linaclotide (LINZESS) 145 MCG CAPS capsule Take 1 capsule (145 mcg total) by mouth daily before breakfast. (Patient not taking:  Reported on 04/14/2021)   losartan (COZAAR) 100 MG tablet TAKE ONE TABLET BY MOUTH EVERY EVENING (Patient taking differently: Take 100 mg by mouth every evening.)   metFORMIN (GLUCOPHAGE-XR) 500 MG 24 hr tablet TAKE ONE TABLET BY MOUTH EVERY MORNING (Patient taking differently: Take 500 mg by mouth daily with breakfast.)   Pirfenidone (ESBRIET) 801 MG TABS Take 1 tablet by mouth 3 (three) times daily. (Patient taking differently: Take 801 mg by mouth 3 (three) times daily.)   polyethylene glycol powder (GLYCOLAX/MIRALAX) powder Take 17 g by mouth daily. (Patient not taking: Reported on 04/14/2021)   XARELTO 20 MG TABS tablet TAKE ONE TABLET BY MOUTH ONCE DAILY WITH SUPPER (Patient taking differently: Take 20 mg by mouth daily with supper.)  Reviewed chart for medication changes ahead of medication coordination call.  No OVs, Consults, or hospital visits since last care coordination call/Pharmacist visit. Patient currently in Hospital  No medication changes indicated *  BP Readings from Last 3 Encounters:  04/15/21 (!) 124/53  03/14/21 100/62  01/20/21 122/78    Lab Results  Component Value Date   HGBA1C 6.0 03/14/2021     Patient obtains medications through Adherence Packaging  30 Days   Last adherence delivery included:  Ezetimbe (Zetia) 10 mg : take one at breakfast Amlodipine 10 mg : take one at breakfast Cyclobenzaprine (Flexiril)  5 mg : take one at evening meal Furosemide (Lasix) 20 mg : Take one at Breakfast Atorvastatin (Lipitor) 80 mg : take one at evening meal Hydrochlorothiazide 25 mg : take one at breakfast Xarelto 20 mg : take one at evening meal Losartan Potassium (Cozaar) 100 mg :  take one at evening meal Dronedarone (Multaq) 400 mg : take one at lunch and one at evening meal Alprazolam (Xanax) 0.25 mg : Take 1 tab 3 times daily as needed   Coordinated fill for : [employer] (2 boxes 14 DS each)  Patient is due for next adherence delivery on:  04/24/21. Called patient and reviewed medications and coordinated delivery. Confirmed Packaging for 30 DS  This delivery to include: Alprazolam (Xanax) 0.25 mg : Take 1 tab 3 times daily as needed Ezetimbe (Zetia) 10 mg : take one at breakfast Amlodipine 10 mg : take one at breakfast Furosemide (Lasix) 20 mg : Take one at Breakfast Cyclobenzaprine (Flexiril)  5 mg : take one at evening meal Atorvastatin (Lipitor) 80 mg : take one at evening meal Hydrochlorothiazide 25 mg : take one at breakfast Xarelto 20 mg : take one at evening meal Losartan Potassium (Cozaar) 100 mg :  take one at evening meal Dronedarone (Multaq) 400 mg : take one at lunch and one at evening meal Freestyle Libre Sensors     Patient in hospital will ask MP to go over this delivery during her next follow up visit call on 04/21/21  Care Gaps: BP - 124/53 1/31/3 hosp PNA Vaccine - Overdue TDAP - [facility] CCM FU -2/23 Eye Exam - Overdue Hepatitis C Screening - Overdue Zoster Vaccine - Overdue Foot Exam - Overdue COVID Booster #4 [employer]) -  Overdue AWV - Last 2021- office aware to schedule as of 9/22  Star Rating Drugs: Metformin (Glucophage XR) 500 mg - Last filled 03/22/2020 30 DS at [name] Losartan (Cozaar) 100 mg - Last filled 03/22/2020 30 DS at [name] Atorvastatin (Lipitor) 80 mg - Last filled 03/22/2020 30 DS at [facility] Pharmacist Assistant [phone]

## 2021-04-15 NOTE — TOC Progression Note (Signed)
Transition of Care [facility]) - Progression Note    Patient Details  Name: Miranda [name] MRN: [id number] Date of Birth: December 03, 1945  Transition of Care [facility]) CM/SW Contact  Zenon Mayo, RN Phone Number: 04/15/2021, 9:55 AM  Clinical Narrative:     Transition of Care [facility]) Screening Note   Patient Details  Name: Miranda [name] Date of Birth: 05-10-1945   Transition of Care [facility]) CM/SW Contact:    Zenon Mayo, RN Phone Number: 04/15/2021, 9:55 AM    Transition of Care Department [facility]) has reviewed patient and no TOC needs have been identified at this time. We will continue to monitor patient advancement through interdisciplinary progression rounds. If new patient transition needs arise, please place a TOC consult.          Expected Discharge Plan and Services                                                 Social Determinants of Health (SDOH) Interventions    Readmission Risk Interventions No flowsheet data found.

## 2021-04-15 NOTE — Progress Notes (Signed)
Progress Note   Patient: Miranda [name] YNW:[id number] DOB: 06/17/1945 DOA: 04/14/2021     1 DOS: the patient was seen and examined on 04/15/2021         Brief hospital course: Mrs. Mertens is a 76 y.o. F with pAF on Eliquis and Multaq, hx PE, hx IPF not on home O2, HTN, dCHF and depression who presented with 2 weeks poor oral intake and fatigue, heart racing and chest discomfort.  In the ER, creatinine 2.4, potassium 2.9.  Admitted, started on fluids, cardiology consulted for tachyarrhythmia.      Assessment and Plan: Acute renal failure due to dehydration Creatinine 2.4 on admission, down to 1.15 today - Continue IV fluids   Hypokalemia Potassium down to 2.8 today.  Magnesium normal - Add potassium to fluids - Continue potassium supplementation - Check cortisol  Hypocalcemia - Supplement Ca  Constipation Patient reports constipation ongoing for several weeks -Resume Linzess - Dulcolax suppository and MiraLAX  Anorexia Unclear what this is from.  No regurgitation or chest symptoms to suggest dysphagia.  She just reports "no appetite".  ?if related to constipation.   - Dietitian consult - Nutritional supplements - Check AM cortisol, if low, will stim, if normal or indeterminate, will have follow up with PCP  Paroxysmal atrial fibrillation -Continue dronedarone -Continue Xarelto, increase back to normal renal function dose  Idiopathic pulmonary fibrosis Not on O2 at home -Continue pirfenidone  Hypertension Blood pressure normal - Continue amlodipine - Hold HCTZ, losartan  Type 2 diabetes -Continue atorvastatin, hold Zetia -Hold metformin  Chronic diastolic CHF Appears dehydrated echo this admission shows EF 55 to 65%, grade 1 diastolic dysfunction, normal valves. - Continue IV fluids  History of pulmonary embolism -Continue Xarelto  Chronic pain Anxiety -Continue Norco, Flexeril, Xanax  Moderate protein calorie malnutrition - Ensure, MVI,  liberalize diet          Subjective: Patient still feels very weak.  No confusion, fever, sputum, chest pain.  No more palpitations.  Physical Exam: Vitals:   04/15/21 0724 04/15/21 1000 04/15/21 1100 04/15/21 1142  BP: (!) 124/53 (!) 118/59 (!) 107/37 (!) 103/43  Pulse: (!) 46     Resp: 18     Temp: 98 F (36.7 C)     TempSrc: Oral     SpO2: 100%     Weight:      Height:       Elderly adult female, lying in bed, no acute distress, interactive and appropriate. Heart rate regular, no murmurs, no lower extremity edema, JVP normal. Respiratory rate normal, lung sounds clear without rales or wheezes. Abdomen soft with no on focal mild tenderness palpation without guarding rigidity or rebound.  No ascites or distention. Face symmetric, speech fluent, upper extremity strength normal Attention normal, affect appropriate, judgment insight appear normal    Data Reviewed: My review of labs and imaging is notable for potassium 2.8, creatinine down to 1.15.  TSH normal Echocardiogram shows grade 1 diastolic dysfunction, normal EF Complete blood count shows normal hemoglobin, white blood cells, and platelets.  Family Communication: Daughter is at the bedside  Disposition: Status is: Inpatient Remains inpatient appropriate because: She has continued ongoing electrolyte abnormality, will need aggressive supplementation and ongoing IV fluids.  If her cortisol tomorrow is normal, her potassium was repleted, and her oral intake is adequate, likely home tomorrow with PCP follow-up.   Planned Discharge Destination: Home with Home Health            Author: Harrell Gave  Shirleen Schirmer, MD 04/15/2021 1:03 PM  For on call review [email or web address].

## 2021-04-15 NOTE — Progress Notes (Signed)
Progress Note  Patient Name: Miranda [name] Date of Encounter: 04/15/2021  [facility] Cardiologist: Calee Nugent [name], MD   Subjective   Feels much better today. Minimal palpitations felt. No arrhythmia on monitor. No chest pain  Inpatient Medications    Scheduled Meds:  amLODipine  10 mg Oral Daily   atorvastatin  80 mg Oral QPM   cyclobenzaprine  5 mg Oral QHS   dronedarone  400 mg Oral BID WC   feeding supplement  237 mL Oral TID BM   linaclotide  145 mcg Oral QAC breakfast   multivitamin with minerals  1 tablet Oral Daily   Pirfenidone  801 mg Oral TID   polyethylene glycol  17 g Oral Daily   rivaroxaban  15 mg Oral Q supper   sodium chloride flush  3 mL Intravenous Q12H   Continuous Infusions:  lactated ringers with kcl 100 mL/hr at 04/15/21 0929   PRN Meds: acetaminophen **OR** acetaminophen, ALPRAZolam, fentaNYL (SUBLIMAZE) injection, HYDROcodone-acetaminophen, ondansetron **OR** ondansetron (ZOFRAN) IV   Vital Signs    Vitals:   04/15/21 0000 04/15/21 0500 04/15/21 0724 04/15/21 1000  BP: (!) 106/47 (!) 100/41 (!) 124/53 (!) 118/59  Pulse: (!) 48 (!) 53 (!) 46   Resp: 18 18 18    Temp: 98.1 F (36.7 C) 98.9 F (37.2 C) 98 F (36.7 C)   TempSrc: Oral Oral Oral   SpO2: 96% 99% 100%   Weight:  74.1 kg    Height:        Intake/Output Summary (Last 24 hours) at 04/15/2021 1056 Last data filed at 04/15/2021 0911 Gross per 24 hour  Intake 2298.65 ml  Output 500 ml  Net 1798.65 ml   Last 3 Weights 04/15/2021 04/14/2021 04/14/2021  Weight (lbs) 163 lb 6.4 oz 160 lb 7.9 oz 165 lb  Weight (kg) 74.118 kg 72.8 kg 74.844 kg      Telemetry    NSR/sinus brady - Personally Reviewed  ECG    None today - Personally Reviewed  Physical Exam   GEN: No acute distress.   Neck: No JVD Cardiac: RRR, no murmurs, rubs, or gallops.  Respiratory: Clear to auscultation bilaterally. GI: Soft, nontender, non-distended  MS: No edema; No deformity. Neuro:  Nonfocal   Psych: Normal affect   Labs    High Sensitivity Troponin:   Recent Labs  Lab 04/14/21 0600 04/14/21 0801  TROPONINIHS 24* 21*     Chemistry Recent Labs  Lab 04/14/21 0600 04/15/21 0207  NA 136 136  K 2.9* 2.8*  CL 92* 101  CO2 28 27  GLUCOSE 117* 88  BUN 24* 15  CREATININE 2.44* 1.15*  CALCIUM 9.9 8.4*  MG 2.2  --   PROT 8.0  --   ALBUMIN 4.1  --   AST 26  --   ALT 21  --   ALKPHOS 39  --   BILITOT 0.6  --   GFRNONAA 20* 50*  ANIONGAP 16* 8    Lipids  Recent Labs  Lab 04/14/21 0600  CHOL 130  TRIG 72  HDL 60  LDLCALC 56  CHOLHDL 2.2    Hematology Recent Labs  Lab 04/14/21 0600 04/15/21 0207  WBC 7.0 4.9  RBC 4.51 3.67*  HGB 13.0 10.6*  HCT 38.2 31.3*  MCV 84.7 85.3  MCH 28.8 28.9  MCHC 34.0 33.9  RDW 13.2 13.2  PLT 216 151   Thyroid  Recent Labs  Lab 04/14/21 1126  TSH 1.274    BNP Recent  Labs  Lab 04/14/21 0600  BNP 42.9    DDimer No results for input(s): DDIMER in the last 168 hours.   Radiology    DG Chest 2 View  Result Date: 04/14/2021 CLINICAL DATA:  Chest pain. EXAM: CHEST - 2 VIEW COMPARISON:  Chest XRs, most recently 02/05/2020.  CT chest, 10 1. FINDINGS: Cardiomediastinal silhouette is within normal limits. Aortic atherosclerosis. Mild hyperinflation. Similar appearance of traction bronchiectasis and peribronchovascular opacities, greatest at the LEFT lung base. No pleural effusion or pneumothorax. ACDF, incompletely imaged. LEFT upper extremity glucose monitor. No acute displaced fracture. IMPRESSION: Chronic fibrotic lung change. No discrete acute superimposed cardiopulmonary process. Electronically Signed   By: Michaelle Birks M.D.   On: 04/14/2021 06:25   ECHOCARDIOGRAM COMPLETE  Result Date: 04/14/2021    ECHOCARDIOGRAM REPORT   Patient Name:   Miranda [name] Date of Exam: 04/14/2021 Medical Rec #:  [id number]        Height:       65.0 in Accession #:    [id number]       Weight:       165.0 lb Date of Birth:  [birth year]         BSA:          1.823 m Patient Age:    76 years         BP:           107/48 mmHg Patient Gender: F                HR:           52 bpm. Exam Location:  Inpatient Procedure: 2D Echo Indications:    Chest pain  History:        Patient has prior history of Echocardiogram examinations, most                 recent 10/19/2017. CHF, Arrythmias:Atrial Fibrillation; Risk                 Factors:Diabetes, Hypertension and Dyslipidemia.  Sonographer:    Arlyss Gandy Referring Phys: [id number] RONDELL A SMITH IMPRESSIONS  1. Left ventricular ejection fraction, by estimation, is 55 to 60%. The left ventricle has normal function. The left ventricle has no regional wall motion abnormalities. Left ventricular diastolic parameters are consistent with Grade I diastolic dysfunction (impaired relaxation).  2. Right ventricular systolic function is normal. The right ventricular size is normal. There is normal pulmonary artery systolic pressure.  3. The mitral valve is normal in structure. No evidence of mitral valve regurgitation. No evidence of mitral stenosis.  4. The aortic valve is normal in structure. Aortic valve regurgitation is not visualized. Aortic valve sclerosis/calcification is present, without any evidence of aortic stenosis.  5. The inferior vena cava is normal in size with greater than 50% respiratory variability, suggesting right atrial pressure of 3 mmHg. Comparison(s): A prior study was performed on 10/19/2017. No significant change from prior study. FINDINGS  Left Ventricle: Left ventricular ejection fraction, by estimation, is 55 to 60%. The left ventricle has normal function. The left ventricle has no regional wall motion abnormalities. The left ventricular internal cavity size was normal in size. There is  no left ventricular hypertrophy. Left ventricular diastolic parameters are consistent with Grade I diastolic dysfunction (impaired relaxation). Right Ventricle: The right ventricular size is normal. No increase in  right ventricular wall thickness. Right ventricular systolic function is normal. There is normal pulmonary artery systolic pressure. The tricuspid regurgitant velocity is  2.39 m/s, and  with an assumed right atrial pressure of 3 mmHg, the estimated right ventricular systolic pressure is 54.4 mmHg. Left Atrium: Left atrial size was normal in size. Right Atrium: Right atrial size was normal in size. Pericardium: There is no evidence of pericardial effusion. Mitral Valve: The mitral valve is normal in structure. No evidence of mitral valve regurgitation. No evidence of mitral valve stenosis. Tricuspid Valve: The tricuspid valve is normal in structure. Tricuspid valve regurgitation is not demonstrated. No evidence of tricuspid stenosis. Aortic Valve: The aortic valve is normal in structure. Aortic valve regurgitation is not visualized. Aortic valve sclerosis/calcification is present, without any evidence of aortic stenosis. Aortic valve mean gradient measures 9.0 mmHg. Aortic valve peak  gradient measures 18.8 mmHg. Aortic valve area, by VTI measures 1.88 cm. Pulmonic Valve: The pulmonic valve was normal in structure. Pulmonic valve regurgitation is not visualized. No evidence of pulmonic stenosis. Aorta: The aortic root is normal in size and structure. Venous: The inferior vena cava is normal in size with greater than 50% respiratory variability, suggesting right atrial pressure of 3 mmHg. IAS/Shunts: No atrial level shunt detected by color flow Doppler.  LEFT VENTRICLE PLAX 2D LVIDd:         4.20 cm   Diastology LVIDs:         2.90 cm   LV e' medial:    5.87 cm/s LV PW:         0.90 cm   LV E/e' medial:  9.8 LV IVS:        0.90 cm   LV e' lateral:   11.00 cm/s LVOT diam:     1.70 cm   LV E/e' lateral: 5.2 LV SV:         76 LV SV Index:   41 LVOT Area:     2.27 cm  RIGHT VENTRICLE RV Basal diam:  3.10 cm RV Mid diam:    3.00 cm RV S prime:     14.10 cm/s TAPSE (M-mode): 2.2 cm LEFT ATRIUM             Index         RIGHT ATRIUM           Index LA diam:        3.80 cm 2.08 cm/m   RA Area:     15.60 cm LA Vol (A2C):   39.7 ml 21.78 ml/m  RA Volume:   39.20 ml  21.50 ml/m LA Vol (A4C):   39.8 ml 21.83 ml/m LA Biplane Vol: 39.7 ml 21.78 ml/m  AORTIC VALVE AV Area (Vmax):    2.03 cm AV Area (Vmean):   1.95 cm AV Area (VTI):     1.88 cm AV Vmax:           217.00 cm/s AV Vmean:          133.000 cm/s AV VTI:            0.403 m AV Peak Grad:      18.8 mmHg AV Mean Grad:      9.0 mmHg LVOT Vmax:         194.00 cm/s LVOT Vmean:        114.000 cm/s LVOT VTI:          0.333 m LVOT/AV VTI ratio: 0.83  AORTA Ao Root diam: 2.60 cm Ao Asc diam:  2.90 cm MITRAL VALVE  TRICUSPID VALVE MV Area (PHT): 2.26 cm     TR Peak grad:   22.8 mmHg MV Decel Time: 335 msec     TR Vmax:        239.00 cm/s MV E velocity: 57.50 cm/s MV A velocity: 114.00 cm/s  SHUNTS MV E/A ratio:  0.50         Systemic VTI:  0.33 m                             Systemic Diam: 1.70 cm Kardie Tobb DO Electronically signed by Berniece Salines DO Signature Date/Time: 04/14/2021/3:14:23 PM    Final     Cardiac Studies   Echo as noted.  Patient Profile     76 y.o. female History of paroxysmal Afib managed with Multaq and Xarelto.  Prior Myoview and Echo in 2019 normal. Has recent tachy-palpitations associated with some chest discomfort.    Assessment & Plan     Palpitation- no arrhythmia on monitor- suspect more related to dehydration.   Minimal troponin elevation 24. Not really c/w ACS - Based on history seems her chest pain is secondary to heart palpitation.   Suspect A. fib RVR with this episode.  She does have underlying CAD with coronary calcification on CT with negative stress test in 2016 and 2019.   EKG without acute ischemic changes.  Troponin not consistent with ACS.  This is due to demand ischemia from AKI. -Her last night episode could be due to dehydration and low potassium.  Correct electrolyte abnormality. -Echocardiogram is  normal   3.  Interstitial lung disease -Per primary team -No active wheezing -Denies prior history of tobacco smoking but has significant secondary smoke exposure   4.  Paroxysmal atrial fibrillation -Suspect elevated heart rate at times with symptoms -Recently had Xarelto last week due to teeth infection -Continue Multaq -Continue Xarelto - replete potassium > 4. - no beta blocker due to bradycardia.   6.  AKI -Reports poor p.o. intake recently -Hydration per primary team -Avoid nephrotoxic agent - creatinine improved with hydration   7.  Hypertension -Blood pressure stable -Hold home losartan/hydrochlorothiazide/Lasix.   8. DM - Per primary team    9.  Hypokalemia -Supplement to > 4   [facility] will sign off.   Medication Recommendations:  per [facility] Other recommendations (labs, testing, etc):  none Follow up as an outpatient:  Dr Curt Bears in 3 months  For questions or updates, please contact [facility] Please consult [email or web address] for contact info under        Signed, Christabell Loseke [name], MD  04/15/2021, 10:56 AM

## 2021-04-15 NOTE — Progress Notes (Addendum)
Initial Nutrition Assessment  DOCUMENTATION CODES:   Non-severe (moderate) malnutrition in context of acute illness/injury  INTERVENTION:   Liberalize diet to regular. Discussed with MD.  Continue Ensure Enlive, increase to TID.  MVI with minerals daily.  NUTRITION DIAGNOSIS:   Moderate Malnutrition related to acute illness (A flutter) as evidenced by mild muscle depletion, moderate muscle depletion, mild fat depletion, percent weight loss (7% weight loss within one month).  GOAL:   Patient will meet greater than or equal to 90% of their needs  MONITOR:   PO intake, Supplement acceptance, Labs  REASON FOR ASSESSMENT:   Malnutrition Screening Tool    ASSESSMENT:   76 yo female admitted with chest pain. PMH includes HTN, HLD, CHF, PAF, DM, pulmonary embolism, pulmonary fibrosis, depression, GERD, recent dental procedure with removal of 2 teeth on 03/28/21.  Patient c/o no appetite for the past few weeks. This started around the time she had dental work. She is not having pain/discomfort in her mouth and has no difficulty chewing or swallowing. She has Ensure at home, but hasn't drank much of it; likes chocolate flavor. She endorses weight loss. She says she has been going in and out of A flutter and this is taking a lot out of her.   Weight history reviewed. Patient has lost 7% of usual weight within the past month, which is severe.   Labs reviewed. K 2.8 CBG: 144-100-85  Medications reviewed and include oral KCl for repletion of low K.   NUTRITION - FOCUSED PHYSICAL EXAM:  Flowsheet Row Most Recent Value  Orbital Region Mild depletion  Upper Arm Region Mild depletion  Thoracic and Lumbar Region Mild depletion  Buccal Region No depletion  Temple Region Moderate depletion  Clavicle Bone Region Moderate depletion  Clavicle and Acromion Bone Region Mild depletion  Scapular Bone Region Mild depletion  Dorsal Hand No depletion  Patellar Region No depletion  Anterior  Thigh Region No depletion  Posterior Calf Region No depletion  Edema (RD Assessment) Mild  Hair Reviewed  Eyes Reviewed  Mouth Reviewed  Skin Reviewed  Nails Reviewed       Diet Order:   Diet Order             Diet heart healthy/carb modified Room service appropriate? Yes; Fluid consistency: Thin  Diet effective now                   EDUCATION NEEDS:   Education needs have been addressed  Skin:  Skin Assessment: Reviewed RN Assessment  Last BM:  no BM documented  Height:   Ht Readings from Last 1 Encounters:  04/14/21 5\' 5"  (1.651 m)    Weight:   Wt Readings from Last 1 Encounters:  04/15/21 74.1 kg    BMI:  Body mass index is 27.19 kg/m.  Estimated Nutritional Needs:   Kcal:  1800-2000  Protein:  90-105 gm  Fluid:  1.8-2 L    Lucas Mallow RD, LDN, CNSC Please refer to Amion for contact information.

## 2021-04-16 ENCOUNTER — Other Ambulatory Visit: Payer: Self-pay | Admitting: Pulmonary Disease

## 2021-04-16 DIAGNOSIS — E440 Moderate protein-calorie malnutrition: Secondary | ICD-10-CM

## 2021-04-16 DIAGNOSIS — J84112 Idiopathic pulmonary fibrosis: Secondary | ICD-10-CM

## 2021-04-16 LAB — BASIC METABOLIC PANEL
Anion gap: 6 (ref 5–15)
BUN: 13 mg/dL (ref 8–23)
CO2: 28 mmol/L (ref 22–32)
Calcium: 8.9 mg/dL (ref 8.9–10.3)
Chloride: 105 mmol/L (ref 98–111)
Creatinine, Ser: 0.97 mg/dL (ref 0.44–1.00)
GFR, Estimated: >60 mL/min (ref >60)
Glucose, Bld: 87 mg/dL (ref 70–99)
Potassium: 3.2 mmol/L — ABNORMAL LOW (ref 3.5–5.1)
Sodium: 139 mmol/L (ref 135–145)

## 2021-04-16 LAB — CORTISOL-AM, BLOOD: Cortisol - AM: 4.7 ug/dL — ABNORMAL LOW (ref 6.7–22.6)

## 2021-04-16 LAB — MAGNESIUM: Magnesium: 1.9 mg/dL (ref 1.7–2.4)

## 2021-04-16 MED ORDER — POTASSIUM CHLORIDE CRYS ER 20 MEQ PO TBCR
40.0000 meq | EXTENDED_RELEASE_TABLET | Freq: Once | ORAL | Status: AC
  Administered 2021-04-16: 40 meq via ORAL
  Filled 2021-04-16: qty 2

## 2021-04-16 MED ORDER — COSYNTROPIN 0.25 MG IJ SOLR
0.2500 mg | Freq: Once | INTRAMUSCULAR | Status: AC
  Administered 2021-04-17: 0.25 mg via INTRAVENOUS
  Filled 2021-04-16: qty 0.25

## 2021-04-16 NOTE — TOC Progression Note (Signed)
Transition of Care [facility]) - Progression Note    Patient Details  Name: Miranda [name] MRN: [id number] Date of Birth: 05-Feb-1946  Transition of Care [facility]) CM/SW Contact  Zenon Mayo, RN Phone Number: 04/16/2021, 10:02 AM  Clinical Narrative:    From home alone with afib with rvr, sob, echo done, per Staff RN she is having diarrhea now.  TOC will continue to follow for dc needs.        Expected Discharge Plan and Services                                                 Social Determinants of Health (SDOH) Interventions    Readmission Risk Interventions No flowsheet data found.

## 2021-04-16 NOTE — Progress Notes (Signed)
Progress Note   Patient: Miranda [name] OVZ:[id number] DOB: 1945-12-07 DOA: 04/14/2021     2 DOS: the patient was seen and examined on 04/16/2021         Brief hospital course: Mrs. Dearcos is a 76 y.o. F with pAF on Eliquis and Multaq, hx PE, hx IPF not on home O2, HTN, dCHF and depression who presented with 2 weeks poor oral intake and fatigue, heart racing and chest discomfort.  In the ER, creatinine 2.4, potassium 2.9.  Admitted, started on fluids, cardiology consulted for tachyarrhythmia.      Assessment and Plan: Acute renal failure due to dehydration: Improvement continues.  - Hold IVF and recheck in AM  Low cortisol: Note hypERkalemia is more consistent with AI, though pt has nonspecific symptoms and hypotension despite volume resuscitation and normal LV systolic function be echo - ACTH stim test 2/2.   Hypotension, essential HTN:  - Hold norvasc. Work up [employer] as above. - Holding HCTZ and ARB  Hypokalemia: Has required aggressive supplementation.  - Continue K supplementation, can give po today. Recheck in AM. Mg 1.9.   Hypocalcemia - Supplement Ca  Constipation Patient reports constipation ongoing for several weeks -Resume Linzess - Dulcolax suppository and MiraLAX  Anorexia Unclear what this is from.  No regurgitation or chest symptoms to suggest dysphagia.  She just reports "no appetite".  ?if related to constipation.   - Dietitian consult - Nutritional supplements - Work up low cortisol as above.   Paroxysmal atrial fibrillation -Continue dronedarone -Continue xarelto   Idiopathic pulmonary fibrosis: Not on O2 at home -Continue pirfenidone   Type 2 diabetes -Continue atorvastatin, hold Zetia -Hold metformin  Chronic diastolic CHF Appears dehydrated echo this admission shows EF 55 to 41%, grade 1 diastolic dysfunction, normal valves. - Continue IV fluids  History of pulmonary embolism -Continue Xarelto  Chronic pain Anxiety -Continue Norco,  Flexeril, Xanax  Moderate protein calorie malnutrition - Ensure, MVI, liberalize diet  Subjective: "eating better" by which she means she ate half a boiled egg and half a stick of bacon this AM. Very diffusely weak though this has improved significantly. No dizziness/lightheadedness, palpitations, or chest pain or dyspnea.   Physical Exam: Vitals:   04/15/21 1300 04/15/21 2031 04/16/21 0300 04/16/21 1109  BP: 104/64 (!) 96/40 (!) 104/35 (!) 135/59  Pulse:  60 (!) 55 (!) 58  Resp:  18 18 18   Temp:  98.2 F (36.8 C) 98.4 F (36.9 C) 97.6 F (36.4 C)  TempSrc:  Oral Oral Oral  SpO2:  100% 100% 99%  Weight:      Height:       Gen: 76 y.o. female in no distress Pulm: Nonlabored breathing room air. Clear. CV: Regular rate and rhythm. No murmur, rub, or gallop. No JVD, no dependent edema. GI: Abdomen soft, non-tender, non-distended, with normoactive bowel sounds.  Ext: Warm, no deformities Skin: No rashes, lesions or ulcers on visualized skin. Neuro: Alert and oriented. No focal neurological deficits. Psych: Judgement and insight appear fair. Mood euthymic & affect congruent. Behavior is appropriate.    Data Reviewed: Echocardiogram shows grade 1 diastolic dysfunction, normal EF Cortisol 4.7. Glucose 87, K 3.2. Na and Cr wnl. Mg 1.9.   Family Communication: None at bedside  Disposition: Status is: Inpatient Remains inpatient appropriate because: Continued electrolyte derangement and minimal/insufficient oral intake. Requires ACTH stim test tomorrow he has continued ongoing electrolyte abnormality, will need aggressive supplementation and ongoing IV fluids.  If her cortisol tomorrow is normal,  her potassium was repleted, and her oral intake is adequate, likely home tomorrow with PCP follow-up.   Planned Discharge Destination: Home with Home Health  Author: Patrecia Pour, MD 04/16/2021 3:32 PM  For on call review [email or web address].

## 2021-04-16 NOTE — Telephone Encounter (Signed)
Refill sent for ESBRIET to [facility]: [phone]  Dose: 801 mg three times daily  Last OV: 11/02/19 - has been seen for research visits only Provider: Dr. Vaughan Browner  Next OV: not scheduled  Miranda [name], PharmD, MPH, BCPS Clinical Pharmacist (Rheumatology and Pulmonology)

## 2021-04-16 NOTE — Progress Notes (Signed)
Mobility Specialist Progress Note:   04/16/21 1122  Mobility  Bed Position Chair  Activity Ambulated with assistance in hallway  Level of Assistance Contact guard assist, steadying assist  Assistive Device None  Distance Ambulated (ft) 480 ft  Activity Response Tolerated well  $Mobility charge 1 Mobility   Pt received in bed willing to participate in mobility. No complaints of pain and asymptomatic. Pt left in chair with call bell in reach and all needs met.   [facility] Public librarian Phone [phone] Secondary Phone [phone]

## 2021-04-16 NOTE — Plan of Care (Signed)

## 2021-04-17 LAB — BASIC METABOLIC PANEL WITH GFR
Anion gap: 7 (ref 5–15)
BUN: 14 mg/dL (ref 8–23)
CO2: 28 mmol/L (ref 22–32)
Calcium: 8.9 mg/dL (ref 8.9–10.3)
Chloride: 105 mmol/L (ref 98–111)
Creatinine, Ser: 1.01 mg/dL — ABNORMAL HIGH (ref 0.44–1.00)
GFR, Estimated: 58 mL/min — ABNORMAL LOW
Glucose, Bld: 89 mg/dL (ref 70–99)
Potassium: 3.6 mmol/L (ref 3.5–5.1)
Sodium: 140 mmol/L (ref 135–145)

## 2021-04-17 LAB — ACTH STIMULATION, 3 TIME POINTS
Cortisol, 30 Min: 17.8 ug/dL
Cortisol, 60 Min: 19.4 ug/dL
Cortisol, Base: 4.7 ug/dL

## 2021-04-17 LAB — GLUCOSE, CAPILLARY: Glucose-Capillary: 146 mg/dL — ABNORMAL HIGH (ref 70–99)

## 2021-04-17 MED ORDER — MIDODRINE HCL 5 MG PO TABS: 5.0000 mg | ORAL_TABLET | Freq: Once | ORAL | Status: DC

## 2021-04-17 NOTE — Progress Notes (Signed)
Mobility Specialist Progress Note:   04/17/21 1136  Mobility  Activity Ambulated with assistance in hallway  Level of Assistance Independent after set-up  Assistive Device None  Distance Ambulated (ft) 480 ft  Activity Response Tolerated well  $Mobility charge 1 Mobility   Pt received in bed willing to participate in mobility. No complaints of pain and asymptomatic. Pt left EOB with call bell in reach and all needs met.   [facility] Public librarian Phone [phone] Secondary Phone [phone]

## 2021-04-17 NOTE — Discharge Summary (Signed)
Physician Discharge Summary   Patient: Miranda [name] MRN: [id number] DOB: 10-22-1945  Admit date:     04/14/2021  Discharge date: 04/17/2021  Discharge Physician: Patrecia Pour   PCP: [name], [name], MD   Recommendations at discharge:   Follow up with PCP with recheck of vital signs including blood pressure and labs including metabolic panel is recommended. Holding HCTZ 25mg , losartan 100mg , lasix 20mg , and norvasc 10mg  at discharge due to hypotension.  Discharge Diagnoses: Principal Problem:   Chest pain Active Problems:   Dyslipidemia   HTN (hypertension)   Hypokalemia   Diabetes mellitus without complication (HCC)   Paroxysmal atrial fibrillation (HCC)   IPF (idiopathic pulmonary fibrosis) (HCC)   Elevated troponin   History of pulmonary embolus (PE)   AKI (acute kidney injury) ([place])   Malnutrition of moderate degree  Hospital Course: HPI: Miranda [name] is a 76 y.o. female with medical history significant of hypertension, hyperlipidemia, CHF, paroxysmal atrial fibrillation on anticoagulation, diabetes mellitus, pulmonary embolism, idiopathic pulmonary fibrosis, depression, and GERD who presented with complaints of feeling as though her heart is racing and chest pain.  Symptoms have been occurring over the last 1-2 weeks she reports having centralized chest pain when episodes occur with intermittent nausea and vomiting.  She reports that she had recently had a dental procedure with removal two teeth 2-3 weeks ago.  Since that time she has not had much of an appetite.  She also had reported complaints of constipation, but had taken several stool softeners and subsequently developed diarrhea.  Denies seeing any blood in stools, but states that her stools are just watery at this time.  Other associated symptoms include generalized fatigue, weakness, shortness of breath especially with episodes, and intermittent cough.  Denies having any significant fever, mouth  pain/swelling, or dysuria symptoms.   ED Course: Upon admission into the emergency department patient was seen to be afebrile, pulse 52-84, respirations 14-21, blood pressure 107/48-121/48, and O2 saturations maintained on room air.  Labs significant for CBC within normal limits, potassium 2.9 chloride 92, CO2 28, BUN 24, creatinine 2.44, anion gap 16, TSH 1.274, BNP 42.9,and high-sensitivity troponin 24->21.  Chest x-ray noted chronic fibrotic lung changes without signs of acute abnormality.  Patient has been given 1 L of lactated Ringer's, potassium chloride 40 mEq, and Zofran.  Cardiology was consulted, suspecting intermittent AFib with RVR due to decreased effectiveness of dronedarone due to poor oral intake/dehydration. Troponin elevation not consistent with ACS, echo interpreted as normal by cardiology. With IV fluid support and electrolyte supplementation, renal function, palpitations, and lassitude significantly improved. Blood pressure medications were held due to lower blood pressures. Cortisol was checked, found to be low, though ACTH stim test was reactive and blood pressure is beginning to rise again. We will hold BP medications until she can follow up with PCP and/or restart stepwise if BP elevated at home.  Assessment and Plan: Acute renal failure due to dehydration: Improved and no longer requiring IVF.    Low cortisol: ACTH stim test 2/2 showed adequate adrenal response.    Hypotension, essential HTN: Blood pressure is starting to improve at discharge while holding medications. Not symptomatic.   Hypokalemia: Has resolved with supplementation. Suspect her oral intake normalizing will make supplementation unnecessary going forward.  - Recheck at follow up.   Hypocalcemia - Supplemented, recheck at follow up.   Constipation:  - Continue bowel regimen  Poor oral intake: Poor appetite, not inability to swallow or symptoms such  as nausea, vomiting, change in taste/smell. Dietitian  consulted.  Paroxysmal atrial fibrillation -Continue dronedarone -Continue xarelto   Idiopathic pulmonary fibrosis: Not on O2 at home -Continue pirfenidone   Type 2 diabetes - No change to home medications  Chronic diastolic CHF: Now euvolemic, cardiology signed off. Echo this admission shows EF 55 to 40%, grade 1 diastolic dysfunction, normal valves.   History of pulmonary embolism -Continue Xarelto  Chronic pain Anxiety -Continue Norco, Flexeril, Xanax   Moderate protein calorie malnutrition - Ensure, MVI, liberalize diet   Consultants: Cardiology Procedures performed: Echo  Disposition: Home Diet recommendation:  Cardiac and Carb modified diet  DISCHARGE MEDICATION: Allergies as of 04/17/2021       Reactions   Fish Oil Anaphylaxis   Other Hives, Shortness Of Breath, Swelling   Allergic to cashew nuts and peanut oil.   Penicillins Anaphylaxis, Hives, Swelling   Has patient had a PCN reaction causing immediate rash, facial/tongue/throat swelling, SOB or lightheadedness with hypotension: Face swelling and hives started first, then swelling of the throat  Has patient had a PCN reaction causing severe rash involving mucus membranes or skin necrosis: Yes  Has patient had a PCN reaction that required hospitalization: No  Has patient had a PCN reaction occurring within the last 10 years: Yes  If all of the above answers are "NO", then may proceed with Cephalospor   Pneumococcal Vaccines Nausea And Vomiting   Cashew Nut Oil    Nitrofurantoin Macrocrystal Hives   Peanut Oil    Aspirin Itching, Rash   Bactrim [sulfamethoxazole-trimethoprim] Hives, Itching, Rash   Ciprofloxacin Hives, Itching, Rash   Ibuprofen Rash   Influenza Vaccines Hives   Ivp Dye [iodinated Contrast Media] Hives, Itching, Rash   Gives benadryl to counteract symptoms   Latex Rash   Macrobid [nitrofurantoin Monohydrate Macrocrystals] Hives   Shellfish Allergy Hives   Patient also allergic to seafood         Medication List     STOP taking these medications    amLODipine 10 MG tablet Commonly known as: NORVASC   diclofenac sodium 1 % Gel Commonly known as: Voltaren   furosemide 20 MG tablet Commonly known as: LASIX   hydrochlorothiazide 25 MG tablet Commonly known as: HYDRODIURIL   losartan 100 MG tablet Commonly known as: COZAAR   polyethylene glycol powder 17 GM/SCOOP powder Commonly known as: GLYCOLAX/MIRALAX       TAKE these medications    Accu-Chek Aviva Plus test strip Generic drug: glucose blood Use once daily for glucose control.  Dx E11.9  For accu-chek aviva   Accu-Chek Guide test strip Generic drug: glucose blood Use as instructed   Accu-Chek Softclix Lancets lancets Use as instructed   acetaminophen 325 MG tablet Commonly known as: TYLENOL Take 650 mg by mouth every 6 (six) hours as needed for moderate pain.   albuterol 108 (90 Base) MCG/ACT inhaler Commonly known as: VENTOLIN HFA Inhale 1 puff into the lungs every 6 (six) hours as needed.   ALPRAZolam 0.25 MG tablet Commonly known as: XANAX TAKE ONE TABLET BY MOUTH three times daily AS NEEDED What changed: additional instructions   atorvastatin 80 MG tablet Commonly known as: LIPITOR TAKE ONE TABLET BY MOUTH EVERY EVENING   cyclobenzaprine 5 MG tablet Commonly known as: FLEXERIL TAKE ONE TABLET BY MOUTH EVERY EVENING What changed: when to take this   Esbriet 801 MG Tabs Generic drug: Pirfenidone TAKE 1 TABLET BY MOUTH 3 TIMES A DAY. What changed: how much to take  ezetimibe 10 MG tablet Commonly known as: ZETIA TAKE ONE TABLET BY MOUTH ONCE DAILY   FreeStyle Libre 14 Day Reader Kerrin Mo 1 each by Does not apply route daily.   FreeStyle Libre 14 Day Sensor Misc USE 1  ONCE DAILY   HYDROcodone-acetaminophen 10-325 MG tablet Commonly known as: NORCO Take 1 tablet by mouth every 6 (six) hours as needed for pain.   lactulose 10 GM/15ML solution Commonly known as:  Constulose TAKE 30 MILLILITERS BY MOUTH  THREE TIMES DAILY AS NEEDED FOR MILD CONSTIPATION   linaclotide 145 MCG Caps capsule Commonly known as: Linzess Take 1 capsule (145 mcg total) by mouth daily before breakfast.   metFORMIN 500 MG 24 hr tablet Commonly known as: GLUCOPHAGE-XR TAKE ONE TABLET BY MOUTH EVERY MORNING What changed: when to take this   Multaq 400 MG tablet Generic drug: dronedarone TAKE 1 TABLET BY MOUTH TWICE DAILY WITH A MEAL What changed:  how much to take how to take this when to take this additional instructions   Xarelto 20 MG Tabs tablet Generic drug: rivaroxaban TAKE ONE TABLET BY MOUTH ONCE DAILY WITH SUPPER What changed: See the new instructions.        Follow-up Information     [name], [name], MD Follow up on 04/21/2021.   Specialty: Internal Medicine Why: @3 :30pm Televisit Contact information: [place] [address] [phone]         [name], [name], MD .   Specialty: Cardiology Contact information: [address] [place] [address] [phone]         [name], MD .   Specialty: Cardiology Contact information: [address] [place] [address] [phone]                Subjective: Patient feels much better, stronger, eating better without any issues, wants to go home. Daughter by phone and step daughter at bedside state she looks/sounds better. No palpitations, chest pain or lightheadedness.   Discharge Exam: BP (!) 122/40 (BP Location: Left Arm)    Pulse 68    Temp 98.3 F (36.8 C) (Oral)    Resp 18    Ht 5\' 5"  (1.651 m)    Wt 75.5 kg    SpO2 99%    BMI 27.70 kg/m   No distress, well-appearing RRR Clear, nonlabored Abd benign No focal deficits  Condition at discharge: improving  The results of significant diagnostics from this hospitalization (including imaging, microbiology, ancillary and laboratory) are listed below for  reference.   Imaging Studies: DG Chest 2 View  Result Date: 04/14/2021 CLINICAL DATA:  Chest pain. EXAM: CHEST - 2 VIEW COMPARISON:  Chest XRs, most recently 02/05/2020.  CT chest, 10 1. FINDINGS: Cardiomediastinal silhouette is within normal limits. Aortic atherosclerosis. Mild hyperinflation. Similar appearance of traction bronchiectasis and peribronchovascular opacities, greatest at the LEFT lung base. No pleural effusion or pneumothorax. ACDF, incompletely imaged. LEFT upper extremity glucose monitor. No acute displaced fracture. IMPRESSION: Chronic fibrotic lung change. No discrete acute superimposed cardiopulmonary process. Electronically Signed   By: Michaelle Birks M.D.   On: 04/14/2021 06:25   ECHOCARDIOGRAM COMPLETE  Result Date: 04/14/2021    ECHOCARDIOGRAM REPORT   Patient Name:   Miranda [name] Date of Exam: 04/14/2021 Medical Rec #:  [id number]        Height:       65.0 in Accession #:    [id number]       Weight:  165.0 lb Date of Birth:  18-Oct-1945        BSA:          1.823 m Patient Age:    57 years         BP:           107/48 mmHg Patient Gender: F                HR:           52 bpm. Exam Location:  Inpatient Procedure: 2D Echo Indications:    Chest pain  History:        Patient has prior history of Echocardiogram examinations, most                 recent 10/19/2017. CHF, Arrythmias:Atrial Fibrillation; Risk                 Factors:Diabetes, Hypertension and Dyslipidemia.  Sonographer:    Arlyss Gandy Referring Phys: [id number] RONDELL A SMITH IMPRESSIONS  1. Left ventricular ejection fraction, by estimation, is 55 to 60%. The left ventricle has normal function. The left ventricle has no regional wall motion abnormalities. Left ventricular diastolic parameters are consistent with Grade I diastolic dysfunction (impaired relaxation).  2. Right ventricular systolic function is normal. The right ventricular size is normal. There is normal pulmonary artery systolic pressure.  3. The mitral  valve is normal in structure. No evidence of mitral valve regurgitation. No evidence of mitral stenosis.  4. The aortic valve is normal in structure. Aortic valve regurgitation is not visualized. Aortic valve sclerosis/calcification is present, without any evidence of aortic stenosis.  5. The inferior vena cava is normal in size with greater than 50% respiratory variability, suggesting right atrial pressure of 3 mmHg. Comparison(s): A prior study was performed on 10/19/2017. No significant change from prior study. FINDINGS  Left Ventricle: Left ventricular ejection fraction, by estimation, is 55 to 60%. The left ventricle has normal function. The left ventricle has no regional wall motion abnormalities. The left ventricular internal cavity size was normal in size. There is  no left ventricular hypertrophy. Left ventricular diastolic parameters are consistent with Grade I diastolic dysfunction (impaired relaxation). Right Ventricle: The right ventricular size is normal. No increase in right ventricular wall thickness. Right ventricular systolic function is normal. There is normal pulmonary artery systolic pressure. The tricuspid regurgitant velocity is 2.39 m/s, and  with an assumed right atrial pressure of 3 mmHg, the estimated right ventricular systolic pressure is 36.1 mmHg. Left Atrium: Left atrial size was normal in size. Right Atrium: Right atrial size was normal in size. Pericardium: There is no evidence of pericardial effusion. Mitral Valve: The mitral valve is normal in structure. No evidence of mitral valve regurgitation. No evidence of mitral valve stenosis. Tricuspid Valve: The tricuspid valve is normal in structure. Tricuspid valve regurgitation is not demonstrated. No evidence of tricuspid stenosis. Aortic Valve: The aortic valve is normal in structure. Aortic valve regurgitation is not visualized. Aortic valve sclerosis/calcification is present, without any evidence of aortic stenosis. Aortic valve mean  gradient measures 9.0 mmHg. Aortic valve peak  gradient measures 18.8 mmHg. Aortic valve area, by VTI measures 1.88 cm. Pulmonic Valve: The pulmonic valve was normal in structure. Pulmonic valve regurgitation is not visualized. No evidence of pulmonic stenosis. Aorta: The aortic root is normal in size and structure. Venous: The inferior vena cava is normal in size with greater than 50% respiratory variability, suggesting right atrial pressure of 3 mmHg. IAS/Shunts:  No atrial level shunt detected by color flow Doppler.  LEFT VENTRICLE PLAX 2D LVIDd:         4.20 cm   Diastology LVIDs:         2.90 cm   LV e' medial:    5.87 cm/s LV PW:         0.90 cm   LV E/e' medial:  9.8 LV IVS:        0.90 cm   LV e' lateral:   11.00 cm/s LVOT diam:     1.70 cm   LV E/e' lateral: 5.2 LV SV:         76 LV SV Index:   41 LVOT Area:     2.27 cm  RIGHT VENTRICLE RV Basal diam:  3.10 cm RV Mid diam:    3.00 cm RV S prime:     14.10 cm/s TAPSE (M-mode): 2.2 cm LEFT ATRIUM             Index        RIGHT ATRIUM           Index LA diam:        3.80 cm 2.08 cm/m   RA Area:     15.60 cm LA Vol (A2C):   39.7 ml 21.78 ml/m  RA Volume:   39.20 ml  21.50 ml/m LA Vol (A4C):   39.8 ml 21.83 ml/m LA Biplane Vol: 39.7 ml 21.78 ml/m  AORTIC VALVE AV Area (Vmax):    2.03 cm AV Area (Vmean):   1.95 cm AV Area (VTI):     1.88 cm AV Vmax:           217.00 cm/s AV Vmean:          133.000 cm/s AV VTI:            0.403 m AV Peak Grad:      18.8 mmHg AV Mean Grad:      9.0 mmHg LVOT Vmax:         194.00 cm/s LVOT Vmean:        114.000 cm/s LVOT VTI:          0.333 m LVOT/AV VTI ratio: 0.83  AORTA Ao Root diam: 2.60 cm Ao Asc diam:  2.90 cm MITRAL VALVE                TRICUSPID VALVE MV Area (PHT): 2.26 cm     TR Peak grad:   22.8 mmHg MV Decel Time: 335 msec     TR Vmax:        239.00 cm/s MV E velocity: 57.50 cm/s MV A velocity: 114.00 cm/s  SHUNTS MV E/A ratio:  0.50         Systemic VTI:  0.33 m                             Systemic Diam: 1.70  cm Kardie Tobb DO Electronically signed by Berniece Salines DO Signature Date/Time: 04/14/2021/3:14:23 PM    Final     Microbiology: Results for orders placed or performed during the hospital encounter of 04/14/21  Resp Panel by RT-PCR (Flu A&B, Covid) Nasopharyngeal Swab     Status: None   Collection Time: 04/14/21  6:51 AM   Specimen: Nasopharyngeal Swab; Nasopharyngeal(NP) swabs in vial transport medium  Result Value Ref Range Status   SARS Coronavirus 2 by RT PCR NEGATIVE NEGATIVE Final    Comment: (  NOTE) SARS-CoV-2 target nucleic acids are NOT DETECTED.  The SARS-CoV-2 RNA is generally detectable in upper respiratory specimens during the acute phase of infection. The lowest concentration of SARS-CoV-2 viral copies this assay can detect is 138 copies/mL. A negative result does not preclude SARS-Cov-2 infection and should not be used as the sole basis for treatment or other patient management decisions. A negative result may occur with  improper specimen collection/handling, submission of specimen other than nasopharyngeal swab, presence of viral mutation(s) within the areas targeted by this assay, and inadequate number of viral copies(<138 copies/mL). A negative result must be combined with clinical observations, patient history, and epidemiological information. The expected result is Negative.  Fact Sheet for Patients:  EntrepreneurPulse.com.au  Fact Sheet for Healthcare Providers:  IncredibleEmployment.be  This test is no t yet approved or cleared by the [place] FDA and  has been authorized for detection and/or diagnosis of SARS-CoV-2 by FDA under an Emergency Use Authorization (EUA). This EUA will remain  in effect (meaning this test can be used) for the duration of the COVID-19 declaration under Section 564(b)(1) of the Act, 21 U.S.C.section 360bbb-3(b)(1), unless the authorization is terminated  or revoked sooner.       Influenza A  by PCR NEGATIVE NEGATIVE Final   Influenza B by PCR NEGATIVE NEGATIVE Final    Comment: (NOTE) The Xpert Xpress SARS-CoV-2/FLU/RSV plus assay is intended as an aid in the diagnosis of influenza from Nasopharyngeal swab specimens and should not be used as a sole basis for treatment. Nasal washings and aspirates are unacceptable for Xpert Xpress SARS-CoV-2/FLU/RSV testing.  Fact Sheet for Patients: EntrepreneurPulse.com.au  Fact Sheet for Healthcare Providers: IncredibleEmployment.be  This test is not yet approved or cleared by the [place] FDA and has been authorized for detection and/or diagnosis of SARS-CoV-2 by FDA under an Emergency Use Authorization (EUA). This EUA will remain in effect (meaning this test can be used) for the duration of the COVID-19 declaration under Section 564(b)(1) of the Act, 21 U.S.C. section 360bbb-3(b)(1), unless the authorization is terminated or revoked.  Performed at [facility], [place] [address], [place], [place] [address]     Labs: CBC: Recent Labs  Lab 04/14/21 0600 04/15/21 0207  WBC 7.0 4.9  HGB 13.0 10.6*  HCT 38.2 31.3*  MCV 84.7 85.3  PLT 216 778   Basic Metabolic Panel: Recent Labs  Lab 04/14/21 0600 04/15/21 0207 04/16/21 0439 04/17/21 0615  NA 136 136 139 140  K 2.9* 2.8* 3.2* 3.6  CL 92* 101 105 105  CO2 28 27 28 28   GLUCOSE 117* 88 87 89  BUN 24* 15 13 14   CREATININE 2.44* 1.15* 0.97 1.01*  CALCIUM 9.9 8.4* 8.9 8.9  MG 2.2  --  1.9  --    Liver Function Tests: Recent Labs  Lab 04/14/21 0600  AST 26  ALT 21  ALKPHOS 39  BILITOT 0.6  PROT 8.0  ALBUMIN 4.1   CBG: Recent Labs  Lab 04/15/21 0618 04/15/21 1133 04/15/21 1624 04/15/21 2126 04/17/21 1309  GLUCAP 85 102* 100* 115* 146*    Discharge time spent: greater than 30 minutes.  Signed: Patrecia Pour, MD [facility] 04/18/2021

## 2021-04-17 NOTE — Progress Notes (Signed)
Patient is alert and oriented x 4 and was discharged home with a stable blood pressure of 136/68. Pt  denies pain or discomfort. Pt was educated on her medications at home and states she has a blood pressure monitor to check her blood pressure.   Her  home medication of Esbriet was returned to her with 73 pills and per the [facility] she was given 7 doses while in the hospital. Pt verified count with me and signed the form.  Pt was transported by a female volunteer in a wheelchair to [place] parking.

## 2021-04-17 NOTE — Plan of Care (Signed)

## 2021-04-17 NOTE — Plan of Care (Signed)
  Problem: Clinical Measurements: Goal: Respiratory complications will improve Outcome: Progressing   Problem: Activity: Goal: Risk for activity intolerance will decrease Outcome: Progressing   

## 2021-04-17 NOTE — TOC Transition Note (Signed)
Transition of Care [facility]) - CM/SW Discharge Note   Patient Details  Name: Miranda [name] MRN: [id number] Date of Birth: [birth year]  Transition of Care [facility]) CM/SW Contact:  Zenon Mayo, RN Phone Number: 04/17/2021, 11:20 AM   Clinical Narrative:    Patient is for dc later today, per MD.  NCM spoke with daughter on the phone, she will be transporting her home when she is ready.  Patient does not have any needs.         Patient Goals and CMS Choice        Discharge Placement                       Discharge Plan and Services                                     Social Determinants of Health (SDOH) Interventions     Readmission Risk Interventions No flowsheet data found.

## 2021-04-18 ENCOUNTER — Telehealth: Payer: Self-pay | Admitting: Pharmacist

## 2021-04-18 NOTE — Chronic Care Management (AMB) (Signed)
Chronic Care Management Pharmacy Assistant   Name: Miranda [name]  MRN: [id number] DOB: October 24, 1945 04/18/20 APPOINTMENT REMINDER   Patient was reminded to have all medications, supplements and any blood glucose and blood pressure readings available for review with [name], Pharm. D, for telephone visit on 04/21/21 at 3.   Questions: Have you had any recent office visit or specialist visit outside of [place]? Yes STOP taking: amLODipine 10 MG tablet (NORVASC) diclofenac sodium 1 % Gel (Voltaren) furosemide 20 MG tablet (LASIX) hydrochlorothiazide 25 MG tablet (HYDRODIURIL) losartan 100 MG tablet (COZAAR) polyethylene  CHANGE how you take: ALPRAZolam Duanne Moron)   Medications: Outpatient Encounter Medications as of 04/18/2021  Medication Sig   Accu-Chek Softclix Lancets lancets Use as instructed   acetaminophen (TYLENOL) 325 MG tablet Take 650 mg by mouth every 6 (six) hours as needed for moderate pain.   albuterol (VENTOLIN HFA) 108 (90 Base) MCG/ACT inhaler Inhale 1 puff into the lungs every 6 (six) hours as needed. (Patient not taking: Reported on 04/14/2021)   ALPRAZolam (XANAX) 0.25 MG tablet TAKE ONE TABLET BY MOUTH three times daily AS NEEDED (Patient taking differently: Take 0.25 mg by mouth 3 (three) times daily as needed for anxiety.)   atorvastatin (LIPITOR) 80 MG tablet TAKE ONE TABLET BY MOUTH EVERY EVENING (Patient taking differently: Take 80 mg by mouth every evening.)   Continuous Blood Gluc Receiver (FREESTYLE LIBRE 14 DAY READER) DEVI 1 each by Does not apply route daily.   Continuous Blood Gluc Sensor (FREESTYLE LIBRE 14 DAY SENSOR) MISC USE 1  ONCE DAILY   cyclobenzaprine (FLEXERIL) 5 MG tablet TAKE ONE TABLET BY MOUTH EVERY EVENING (Patient taking differently: Take 5 mg by mouth at bedtime.)   dronedarone (MULTAQ) 400 MG tablet TAKE 1 TABLET BY MOUTH TWICE DAILY WITH A MEAL (Patient taking differently: Take 400 mg by mouth 2 (two) times daily with  a meal.)   ESBRIET 801 MG TABS TAKE 1 TABLET BY MOUTH 3 TIMES A DAY.   ezetimibe (ZETIA) 10 MG tablet TAKE ONE TABLET BY MOUTH ONCE DAILY (Patient taking differently: Take 10 mg by mouth daily.)   glucose blood (ACCU-CHEK AVIVA PLUS) test strip Use once daily for glucose control.  Dx E11.9  For accu-chek aviva   glucose blood (ACCU-CHEK GUIDE) test strip Use as instructed   HYDROcodone-acetaminophen (NORCO) 10-325 MG tablet Take 1 tablet by mouth every 6 (six) hours as needed for pain.   lactulose (CONSTULOSE) 10 GM/15ML solution TAKE 30 MILLILITERS BY MOUTH  THREE TIMES DAILY AS NEEDED FOR MILD CONSTIPATION (Patient not taking: Reported on 04/14/2021)   linaclotide (LINZESS) 145 MCG CAPS capsule Take 1 capsule (145 mcg total) by mouth daily before breakfast. (Patient not taking: Reported on 04/14/2021)   metFORMIN (GLUCOPHAGE-XR) 500 MG 24 hr tablet TAKE ONE TABLET BY MOUTH EVERY MORNING (Patient taking differently: Take 500 mg by mouth daily with breakfast.)   XARELTO 20 MG TABS tablet TAKE ONE TABLET BY MOUTH ONCE DAILY WITH SUPPER (Patient taking differently: Take 20 mg by mouth daily with supper.)   No facility-administered encounter medications on file as of 04/18/2021.   Care Gaps: BP - 124/53 1/31/3 hosp PNA Vaccine - Overdue TDAP - [facility] FU -2/23 Eye Exam - Overdue Hepatitis C Screening - Overdue Zoster Vaccine - Overdue Foot Exam - Overdue COVID Booster #4 [employer]) - Overdue AWV - Last 2021- office aware to schedule as of 9/22   Star Rating Drugs: Metformin (Glucophage XR) 500  mg - Last filled 03/22/2020 30 DS at [name] Losartan (Cozaar) 100 mg - Last filled 03/22/2020 30 DS at [name] Atorvastatin (Lipitor) 80 mg - Last filled 03/22/2020 30 DS at [facility] Pharmacist Assistant [phone]

## 2021-04-21 ENCOUNTER — Telehealth: Payer: Self-pay | Admitting: Gastroenterology

## 2021-04-21 ENCOUNTER — Telehealth: Payer: Self-pay

## 2021-04-21 ENCOUNTER — Ambulatory Visit (INDEPENDENT_AMBULATORY_CARE_PROVIDER_SITE_OTHER): Payer: HMO | Admitting: Pharmacist

## 2021-04-21 DIAGNOSIS — E1169 Type 2 diabetes mellitus with other specified complication: Secondary | ICD-10-CM

## 2021-04-21 DIAGNOSIS — I10 Essential (primary) hypertension: Secondary | ICD-10-CM

## 2021-04-21 MED ORDER — LINACLOTIDE 145 MCG PO CAPS: 145.0000 ug | ORAL_CAPSULE | Freq: Every day | ORAL | 2 refills | Status: DC

## 2021-04-21 NOTE — Telephone Encounter (Signed)
Refill sent.

## 2021-04-21 NOTE — Telephone Encounter (Signed)
Transition Care Management Follow-up Telephone Call Date of discharge and from where: TCM [facility] 04-17-21 DX: acute renal failure due to dehydration  How have you been since you were released from the hospital? Doing better  Any questions or concerns? No  Items Reviewed: Did the pt receive and understand the discharge instructions provided? Yes  Medications obtained and verified? Yes  Other? No  Any new allergies since your discharge? No  Dietary orders reviewed? Yes Do you have support at home? Yes   Home Care and Equipment/Supplies: Were home health services ordered? no If so, what is the name of the agency? na  Has the agency set up a time to come to the patient's home? not applicable Were any new equipment or medical supplies ordered?  No What is the name of the medical supply agency? na Were you able to get the supplies/equipment? not applicable Do you have any questions related to the use of the equipment or supplies? No  Functional Questionnaire: (I = Independent and D = Dependent) ADLs: I  Bathing/Dressing- I  Meal Prep- I  Eating- I  Maintaining continence- I  Transferring/Ambulation- I  Managing Meds- I  Follow up appointments reviewed:  PCP Hospital f/u appt confirmed? Yes  Scheduled to see Dr [name] on 04-24-21 @ [facility] f/u appt confirmed? No . Are transportation arrangements needed? No  If their condition worsens, is the pt aware to call PCP or go to the Emergency Dept.? Yes Was the patient provided with contact information for the PCP's office or ED? Yes Was to pt encouraged to call back with questions or concerns? Yes

## 2021-04-21 NOTE — Telephone Encounter (Signed)
Request for Linzess refill

## 2021-04-21 NOTE — Progress Notes (Signed)
Chronic Care Management Pharmacy Note  04/21/2021 Name:  Miranda [name] MRN:  [id number] DOB:  1945-10-14   Summary: BP at goal < 140/90 per home readings without BP medications  Recommendations/Changes made from today's visit: -Recommended daily BP monitoring at home until office visit with PCP -Consider stopping metformin based on recent A1c  -Requested urgent lactulose refill and Linzess refill  Plan: Follow up BP assessment in 1-2 months Follow up in 4 months  Subjective: Miranda [name] is an 76 y.o. year old female who is a primary patient of [name], [name], MD.  The CCM team was consulted for assistance with disease management and care coordination needs.    Engaged with patient face to face for follow up visit in response to provider referral for pharmacy case management and/or care coordination services.   Consent to Services:  The patient was given information about Chronic Care Management services, agreed to services, and gave verbal consent prior to initiation of services.  Please see initial visit note for detailed documentation.   Patient Care Team: [name], [name], MD as PCP - General (Internal Medicine) [name], [name], MD as PCP - Cardiology (Cardiology) [name], MD as PCP - Electrophysiology (Cardiology) [name], PA-C (Inactive) (Dermatology) [name], [facility] as Pharmacist (Pharmacist)  Recent office visits: 03/14/21 [name], [name], MD - Patient presented for Frequent Urination and other concerns. No medication changes.  12-03-2020 [name], [name], MD - Patient presented for Type 2 diabetes mellitus with other specified complication, without long-term current use of insulin and other concerns. Stopped Methylprednisolone.  Recent consult visits: 03/20/21 [name] (Rehab and Phys Med) - Patient presented for Lumbar Radiculopathy Injection. No medication changes  noted.  01/20/21 [name], [name], MD (Cardiology) - Patient presented for Paroxysmal atrial fibrillation. No medication changes.  12/24/20 [name] (Physical Med) - Patient resented for Lumbar radiculopathy. No other details available.   12/19/20 [name] (Physical Med) - Patient resented for Cervical radiculopathy and other concerns. No other details available.  08/29/20 Wilfrid Lund, MD (gastro): Patient presented for chronic constipation. Provided Linzess samples.  05/06/20 Brand Males, MD (pulmonary): Patient presented for IPF research encounter.  04/19/20 [name], MD (cardiology): Patient presented for Afib follow up.   Hospital visits: Medication Reconciliation was completed by comparing discharge summary, patients EMR and Pharmacy list, and upon discussion with patient.   Patient presented to [facility] on 04/14/21 due to Chest Pain. Patient has not been discharged yet   New?Medications Started at [facility] Discharge:?? -none   Medication Changes at Hospital Discharge: -Changed the following medication ALPRAZolam Duanne Moron)   Medications Discontinued at Hospital Discharge: -Stopped the following medication amLODipine 10 MG tablet (NORVASC) diclofenac sodium 1 % Gel (Voltaren) furosemide 20 MG tablet (LASIX) hydrochlorothiazide 25 MG tablet (HYDRODIURIL) losartan 100 MG tablet (COZAAR) polyethylene   Medications that remain the same after Hospital Discharge:?? -All other medications will remain the same.    Objective:  Lab Results  Component Value Date   CREATININE 1.01 (H) 04/17/2021   BUN 14 04/17/2021   GFR 75.22 10/12/2019   GFRNONAA 58 (L) 04/17/2021   GFRAA >60 11/04/2017   NA 140 04/17/2021   K 3.6 04/17/2021   CALCIUM 8.9 04/17/2021   CO2 28 04/17/2021   GLUCOSE 89 04/17/2021    Lab Results  Component Value Date/Time   HGBA1C 6.0 03/14/2021 10:19 AM   HGBA1C 5.8 (A) 12/03/2020 03:05 PM  HGBA1C 5.8 (A) 06/20/2020  11:16 AM   HGBA1C 6.1 12/01/2019 10:25 AM   HGBA1C 6.0 02/02/2018 12:20 PM   GFR 75.22 10/12/2019 10:41 AM   GFR 67.48 11/09/2018 10:20 AM    Last diabetic Eye exam: No results found for: HMDIABEYEEXA  Last diabetic Foot exam: No results found for: HMDIABFOOTEX   Lab Results  Component Value Date   CHOL 130 04/14/2021   HDL 60 04/14/2021   LDLCALC 56 04/14/2021   LDLDIRECT 152.8 03/30/2011   TRIG 72 04/14/2021   CHOLHDL 2.2 04/14/2021    Hepatic Function Latest Ref Rng & Units 04/14/2021 12/01/2019 10/12/2019  Total Protein 6.5 - 8.1 g/dL 8.0 8.1 7.8  Albumin 3.5 - 5.0 g/dL 4.1 - 4.4  AST 15 - 41 U/L '26 20 21  ' ALT 0 - 44 U/L '21 18 20  ' Alk Phosphatase 38 - 126 U/L 39 - 41  Total Bilirubin 0.3 - 1.2 mg/dL 0.6 0.7 0.6  Bilirubin, Direct 0.0 - 0.3 mg/dL - - -    Lab Results  Component Value Date/Time   TSH 1.274 04/14/2021 11:26 AM   TSH 1.45 12/01/2019 10:25 AM   TSH 1.499 09/14/2017 04:58 PM   TSH 0.86 06/01/2014 11:45 AM    CBC Latest Ref Rng & Units 04/15/2021 04/14/2021 09/09/2020  WBC 4.0 - 10.5 K/uL 4.9 7.0 6.3  Hemoglobin 12.0 - 15.0 g/dL 10.6(L) 13.0 12.8  Hematocrit 36.0 - 46.0 % 31.3(L) 38.2 40.1  Platelets 150 - 400 K/uL 151 216 204    Lab Results  Component Value Date/Time   VD25OH 18.01 (L) 09/18/2020 11:07 AM   VD25OH 14.58 (L) 12/01/2019 10:25 AM    Clinical ASCVD: No  The 10-year ASCVD risk score (Arnett DK, et al., 2019) is: 22.7%   Values used to calculate the score:     Age: 46 years     Sex: Female     Is Non-Hispanic African American: Yes     Diabetic: Yes     Tobacco smoker: No     Systolic Blood Pressure: 539 mmHg     Is BP treated: No     HDL Cholesterol: 60 mg/dL     Total Cholesterol: 130 mg/dL    Depression screen [facility] 2/9 03/14/2021 12/03/2020 11/29/2019  Decreased Interest 0 0 0  Down, Depressed, Hopeless 1 1 0  PHQ - 2 Score 1 1 0  Altered sleeping '1 1 1  ' Tired, decreased energy 1 1 0  Change in appetite 0 0 0  Feeling bad or  failure about yourself  1 0 0  Trouble concentrating 0 0 0  Moving slowly or fidgety/restless 0 0 0  Suicidal thoughts 0 0 0  PHQ-9 Score '4 3 1  ' Difficult doing work/chores Not difficult at all - -  Some recent data might be hidden     CHA2DS2/VAS Stroke Risk Points  Current as of yesterday     5 >= 2 Points: High Risk  1 - 1.99 Points: Medium Risk  0 Points: Low Risk    Last Change: N/A      Details    This score determines the patient's risk of having a stroke if the  patient has atrial fibrillation.       Points Metrics  0 Has Congestive Heart Failure:  No    Current as of yesterday  1 Has Vascular Disease:  Yes    Current as of yesterday  1 Has Hypertension:  Yes    Current  as of yesterday  1 Age:  68    Current as of yesterday  1 Has Diabetes:  Yes    Current as of yesterday  0 Had Stroke:  No  Had TIA:  No  Had Thromboembolism:  No    Current as of yesterday  1 Female:  Yes    Current as of yesterday     Social History   Tobacco Use  Smoking Status Never  Smokeless Tobacco Never   BP Readings from Last 3 Encounters:  04/17/21 (!) 122/40  03/14/21 100/62  01/20/21 122/78   Pulse Readings from Last 3 Encounters:  04/17/21 68  03/14/21 62  01/20/21 66   Wt Readings from Last 3 Encounters:  04/17/21 166 lb 7.2 oz (75.5 kg)  03/14/21 175 lb (79.4 kg)  01/20/21 172 lb (78 kg)   BMI Readings from Last 3 Encounters:  04/17/21 27.70 kg/m  03/14/21 28.25 kg/m  01/20/21 27.76 kg/m    Assessment/Interventions: Review of patient past medical history, allergies, medications, health status, including review of consultants reports, laboratory and other test data, was performed as part of comprehensive evaluation and provision of chronic care management services.   SDOH:  (Social Determinants of Health) assessments and interventions performed: No  SDOH Screenings   Alcohol Screen: Not on file  Depression (PHQ2-9): Low Risk    PHQ-2 Score: 4  Financial  Resource Strain: Not on file  Food Insecurity: Not on file  Housing: Not on file  Physical Activity: Not on file  Social Connections: Not on file  Stress: Not on file  Tobacco Use: Low Risk    Smoking Tobacco Use: Never   Smokeless Tobacco Use: Never   Passive Exposure: Not on file  Transportation Needs: Not on file    CCM Care Plan  Allergies  Allergen Reactions   Fish Oil Anaphylaxis   Other Hives, Shortness Of Breath and Swelling    Allergic to cashew nuts and peanut oil.   Penicillins Anaphylaxis, Hives and Swelling    Has patient had a PCN reaction causing immediate rash, facial/tongue/throat swelling, SOB or lightheadedness with hypotension: Face swelling and hives started first, then swelling of the throat  Has patient had a PCN reaction causing severe rash involving mucus membranes or skin necrosis: Yes  Has patient had a PCN reaction that required hospitalization: No  Has patient had a PCN reaction occurring within the last 10 years: Yes  If all of the above answers are "NO", then may proceed with Cephalospor   Pneumococcal Vaccines Nausea And Vomiting   Cashew Nut Oil    Nitrofurantoin Macrocrystal Hives   Peanut Oil    Aspirin Itching and Rash   Bactrim [Sulfamethoxazole-Trimethoprim] Hives, Itching and Rash   Ciprofloxacin Hives, Itching and Rash   Ibuprofen Rash   Influenza Vaccines Hives   Ivp Dye [Iodinated Contrast Media] Hives, Itching and Rash    Gives benadryl to counteract symptoms   Latex Rash   Macrobid [Nitrofurantoin Monohydrate Macrocrystals] Hives   Shellfish Allergy Hives    Patient also allergic to seafood    Medications Reviewed Today     Reviewed by [name], [facility] (Pharmacist) on 04/21/21 at 1545  Med List Status: <None>   Medication Order Taking? Sig Documenting Provider Last Dose Status Informant  Accu-Chek Softclix Lancets lancets [id number] No Use as instructed [name], [name], MD Taking Active Self  acetaminophen  (TYLENOL) 325 MG tablet [id number] No Take 650 mg by mouth every 6 (six)  hours as needed for moderate pain. [provider] Taking Active Self  albuterol (VENTOLIN HFA) 108 (90 Base) MCG/ACT inhaler [id number] No Inhale 1 puff into the lungs every 6 (six) hours as needed. [name], [name], MD Taking Active Self  ALPRAZolam Duanne Moron) 0.25 MG tablet [id number] No TAKE ONE TABLET BY MOUTH three times daily AS NEEDED  Patient taking differently: Take 0.25 mg by mouth 3 (three) times daily as needed for anxiety.   [name], [name], MD Taking Active Self  atorvastatin (LIPITOR) 80 MG tablet [id number] No TAKE ONE TABLET BY MOUTH EVERY EVENING  Patient taking differently: Take 80 mg by mouth every evening.   [name], [name], MD Taking Active Self  Continuous Blood Gluc Receiver (FREESTYLE LIBRE 14 DAY READER) DEVI [id number] No 1 each by Does not apply route daily. [name], [name], MD Taking Active Self  Continuous Blood Gluc Sensor (FREESTYLE LIBRE 14 [place]) [place] [id number] No USE 1  ONCE DAILY [name], [name], MD Taking Active Self  cyclobenzaprine (FLEXERIL) 5 MG tablet [id number] No TAKE ONE TABLET BY MOUTH EVERY EVENING  Patient taking differently: Take 5 mg by mouth at bedtime.   [name], [name], MD Taking Active Self  dronedarone (MULTAQ) 400 MG tablet [id number] No TAKE 1 TABLET BY MOUTH TWICE DAILY WITH A MEAL  Patient taking differently: Take 400 mg by mouth 2 (two) times daily with a meal.   [name], MD Taking Active Self  ESBRIET 801 MG TABS [id number] No TAKE 1 TABLET BY MOUTH 3 TIMES A DAY. [name], MD Taking Active   ezetimibe (ZETIA) 10 MG tablet [id number] No TAKE ONE TABLET BY MOUTH ONCE DAILY  Patient taking differently: Take 10 mg by mouth daily.   [name], [name], MD Taking Active Self  HYDROcodone-acetaminophen [facility]) 10-325 MG tablet [id number] No Take 1 tablet by mouth every 6 (six)  hours as needed for pain. [provider] Taking Active Self  lactulose (CONSTULOSE) 10 GM/15ML solution [id number] No TAKE 30 MILLILITERS BY MOUTH  THREE TIMES DAILY AS NEEDED FOR MILD CONSTIPATION [name], [name], MD Taking Active Self  linaclotide [facility]) 145 MCG CAPS capsule [id number] No Take 1 capsule (145 mcg total) by mouth daily before breakfast. [name], MD Taking Active Self  metFORMIN (GLUCOPHAGE-XR) 500 MG 24 hr tablet [id number] No TAKE ONE TABLET BY MOUTH EVERY MORNING  Patient taking differently: Take 500 mg by mouth daily with breakfast.   [name], [name], MD Taking Active Self  XARELTO 20 MG TABS tablet [id number] No TAKE ONE TABLET BY MOUTH ONCE DAILY WITH SUPPER  Patient taking differently: Take 20 mg by mouth daily with supper.   [name], [name], MD Taking Active Self            Patient Active Problem List   Diagnosis Date Noted   Malnutrition of moderate degree 04/15/2021   Chest pain 04/14/2021   Elevated troponin 04/14/2021   History of pulmonary embolus (PE) 04/14/2021   AKI (acute kidney injury) ([place]) 04/14/2021   Cervical radiculopathy 09/11/2020   Vitamin D deficiency 12/05/2019   Healthcare maintenance 11/09/2018   IPF (idiopathic pulmonary fibrosis) ([place]) 11/09/2018   Therapeutic drug monitoring 04/20/2018   Paroxysmal atrial fibrillation ([place]) 04/20/2018   Atrial fibrillation with RVR ([place]) 11/03/2017   LVH (left ventricular hypertrophy) 09/22/2017   Diabetes mellitus without complication ([place]) 02/77/4128   Degeneration of lumbar intervertebral disc 07/06/2017  Pulmonary emboli (HCC) 05/02/2016   Right leg pain 05/01/2016   ILD (interstitial lung disease) ([place]) 05/01/2016   Bradycardia 05/01/2016   Coronary artery calcification seen on CAT scan 08/06/2015   Open angle with borderline findings, low risk, bilateral 04/29/2015   UIP (usual interstitial pneumonitis) ([place]) 04/05/2015   Bilateral  posterior capsular opacification 09/21/2014   Pseudophakia of both eyes 07/27/2014   Combined form of senile cataract of both eyes 05/30/2014   Osteoarthritis 09/17/2011   Narrowing of intervertebral disc space 09/17/2011   Pyelonephritis with possible nonobstructing 5 mm calculus by renal ultrasound 09/16/2011   Hypokalemia 09/16/2011   External hemorrhoids 09/13/2011   Obstructive sleep apnea syndrome 06/10/2011   Anal fissure 02/17/2011   History of colonic polyps 04/03/2010   Respiratory condition due to chemical fumes and vapors ([place]) 01/20/2010   Urinary incontinence 04/30/2009   Insomnia 09/28/2007   Asthma 11/22/2006   Dyslipidemia 08/19/2006   HTN (hypertension) 08/19/2006   GERD 08/19/2006   Low back pain 08/19/2006    Immunization History  Administered Date(s) Administered   Fluad Quad(high Dose 65+) 11/29/2019, 12/03/2020   PFIZER(Purple Top)SARS-COV-2 Vaccination 04/21/2019, 05/12/2019, 12/15/2019   PPD Test 11/19/2010   Pneumococcal Polysaccharide-23 11/29/2019   Patient is usually not taking the metformin on most days.  Conditions to be addressed/monitored:  Hypertension, Hyperlipidemia, Diabetes, Atrial Fibrillation, Asthma, Anxiety and fluid retention, back pain, constipation, IPF  Conditions addressed this visit: Hypertension, diabetes  Care Plan : CCM Pharmacy Care Plan  Updates made by [name], [place] since 04/21/2021 12:00 AM     Problem: Problem: Hypertension, Hyperlipidemia, Diabetes, Atrial Fibrillation, Asthma, Anxiety and fluid retention, back pain, constipation, IPF      Long-Range Goal: Patient-Specific Goal   Start Date: 06/10/2020  Expected End Date: 05/13/2021  Recent Progress: On track  Priority: High  Note:   Current Barriers:  Unable to independently monitor therapeutic efficacy Unable to self administer medications as prescribed  Pharmacist Clinical Goal(s):  Patient will achieve adherence to monitoring guidelines and  medication adherence to achieve therapeutic efficacy achieve ability to self administer medications as prescribed through use of adherence packaging as evidenced by patient report through collaboration with PharmD and provider.   Interventions: 1:1 collaboration with [name], [name], MD regarding development and update of comprehensive plan of care as evidenced by provider attestation and co-signature Inter-disciplinary care team collaboration (see longitudinal plan of care) Comprehensive medication review performed; medication list updated in electronic medical record  Hypertension (BP goal <140/90) -Controlled -Current treatment: No medications -Medications previously tried: benazepril, metoprolol, HCTZ, losartan, amlodipine  -Current home readings: 128/65; patient just bought a BP cuff and plans to start using -Current dietary habits: patient reports working on it and has cut out salt and pork; patient tries to eat a lot more fish and vegetables -Current exercise habits: patient reports she walks most days; recommended goal of 150 min/week  -Denies hypotensive/hypertensive symptoms -Educated on Daily salt intake goal < 2300 mg; Exercise goal of 150 minutes per week; Importance of home blood pressure monitoring; -Counseled to monitor BP at home at least weekly, document, and provide log at future appointments -Counseled on diet and exercise extensively Recommended to continue current medication  Hyperlipidemia: (LDL goal < 55) -Not ideally controlled -Current treatment: Atorvastatin 48m, 1 tablet once daily - Appropriate, Query effective, Safe, Accessible Ezetimibe 10 mg, 1 tablet once daily - Appropriate, Query effective, Safe, Accessible -Medications previously tried: lovastatin  -Current dietary patterns: patient tries to eat  a lot more fish and vegetables -Current exercise habits:  patient reports she walks most days; recommended goal of 150 min/week  -Educated on  Cholesterol goals;  Benefits of statin for ASCVD risk reduction; Importance of limiting foods high in cholesterol; Exercise goal of 150 minutes per week; -Counseled on diet and exercise extensively Recommended to continue current medication Recommended repeat lipid panel as this has not been checked since addition of Zetia  Diabetes (A1c goal <7%) -Controlled -Current medications: Metformin XR 500 mg 1 tablet before breakfast - Appropriate, Effective, Query Safe, Accessible -Medications previously tried: none  -Current home glucose readings: reviewed CGM data and patient is consistently having lows in the morning (9% of BG readings were < 70).  fasting glucose: n/a post prandial glucose: n/a -Denies hypoglycemic/hyperglycemic symptoms -Current meal patterns:  breakfast: n/a  lunch: n/a  dinner: n/a snacks: n/a drinks: n/a -Current exercise: walking daily -Educated on Exercise goal of 150 minutes per week; Prevention and management of hypoglycemic episodes; Continuous glucose monitoring; Carbohydrate counting and/or plate method -Counseled to check feet daily and get yearly eye exams -Collaborated with PCP about trial off of metformin to see if the low BGs stop  Atrial Fibrillation (Goal: prevent stroke and major bleeding) -Controlled -CHADSVASC: 5 -Current treatment: Rate control: Multaq 400 mg 1 tablet twice daily with a meal - Appropriate, Effective, Safe, Accessible Anticoagulation: Xarelto 20 mg 1 tablet daily with dinner - Appropriate, Effective, Safe, Accessible -Medications previously tried: none -Home BP and HR readings: did not provide any -Counseled on  the importance of taking both medications with a meal -Recommended to continue current medication Patient has resumed Xarelto since surgery.  Asthma/IPF (Goal: control symptoms and prevent exacerbations) -Controlled -Current treatment  Albuterol HFA 12mg, act inhaler, inhale 1 puff every six hours as needed  -  Appropriate, Effective, Safe, Accessible Pirfenidone 8066m 1 tablet in the morning, at noon, and at bedtime  - Appropriate, Effective, Safe, Accessible -Medications previously tried: none  -Patient denies consistent use of maintenance inhaler -Frequency of rescue inhaler use: as needed -Counseled on Proper inhaler technique; When to use rescue inhaler -Recommended to continue current medication   Fluid retention (Goal: minimize swelling) -Controlled -Current treatment  None -Medications previously tried: furosemide  -Recommended to continue current medication Counseled on limiting salt intake to prevent fluid retention  Back pain (Goal: minimize symptoms of pain) -Controlled -Current treatment  Hydrocodone/APAP 10/32532m1 tablet every six hours as needed (takes as indicated) - Appropriate, Effective, Safe, Accessible Cyclobenzaprine 5mg68m tablet at bedtime  - Appropriate, Effective, Safe, Accessible Diclofenac 1% gel, apply 2 g four times daily  - Appropriate, Effective, Safe, Accessible APAP 500 mg, 1 tablet as needed for pain - Appropriate, Effective, Safe, Accessible -Medications previously tried: meloxicam, naproxen, oxycodone  -Recommended to continue current medication  Anxiety (Goal: minimize symptoms) -Not ideally controlled -Current treatment: Alprazolam 0.25mg22m tablet three times daily as needed for anxiety - Appropriate, Effective, Query Safe, Accessible -Medications previously tried/failed: none -GAD7: n/a -Educated on Benefits of medication for symptom control -Recommended to continue current medication  Constipation (Goal: normal bowel movements) -Controlled -Current treatment  docusate 100mg,66mapsule twice daily - Appropriate, Effective, Safe, Accessible Lactulose solution, take 30 ML three times daily as needed for mild constipation - not taking right now Miralax, 17g one daily (not a part of daily regimen) Linzess 145 mcg daily - Appropriate,  Effective, Safe, Query accessible -Medications previously tried: bisacodyl, Linzess (previous loose stools)  -Recommended to continue  current medication Assessed patient finances. Provided PAP application for Linzess in office.   Health Maintenance -Vaccine gaps: shingles, tetanus -Current therapy:  Biotin 5000 mcg 1 capsule daily -Educated on Cost vs benefit of each product must be carefully weighed by individual consumer -Patient is satisfied with current therapy and denies issues -Recommended to continue current medication  Patient Goals/Self-Care Activities Patient will:  - take medications as prescribed check blood pressure at least weekly, document, and provide at future appointments target a minimum of 150 minutes of moderate intensity exercise weekly  Follow Up Plan: Telephone follow up appointment with care management team member scheduled for: 4 months      Medication Assistance: None required.  Patient affirms current coverage meets needs.   Compliance/Adherence/Medication fill history: Care Gaps: Shingrix, foot exam, Hep C screening, eye exam, tetanus, prevnar 20, COVID booster BP - 124/53 1/31/3 hosp  Star-Rating Drugs: Metformin (Glucophage XR) 500 mg - Last filled 03/22/2020 30 DS at [name] Losartan (Cozaar) 100 mg - Last filled 03/22/2020 30 DS at [name] Atorvastatin (Lipitor) 80 mg - Last filled 03/22/2020 30 DS at [name]  Patient's preferred pharmacy is:  [name] - [place], [place] - [address] [place] [address] Phone: [phone] Fax: [phone]  [place], [place] [address] Phone: [phone] Fax: [phone]  Uses pill box? Yes Pt endorses 90% compliance   We discussed: Benefits of medication synchronization, packaging and delivery as well as enhanced pharmacist oversight with  [name]. Patient decided to: Utilize [name] pharmacy for medication synchronization, packaging and delivery  Care Plan and Follow Up Patient Decision:  Patient agrees to Care Plan and Follow-up.  Plan: Telephone follow up appointment with care management team member scheduled for:  4 months  [name], PharmD [place] Pharmacist [place] [facility] [phone]

## 2021-04-21 NOTE — Progress Notes (Signed)
Per MP call to Gastro to request Linzess prescription be sent over to [facility] Pharmacist Assistant [phone]

## 2021-04-23 IMAGING — DX DG TOE GREAT 2+V*R*
3 series · 3 of 3 positions shown · non-contrast
Comparison: None.

CLINICAL DATA: Right great toe pain after tripping down stairs
yesterday.

EXAM:
RIGHT GREAT TOE

[toes dp (1 of 2)]
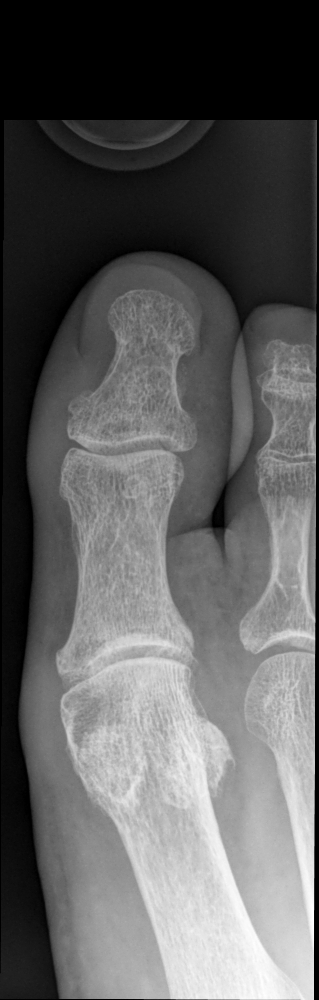

[toes dp (2 of 2)]
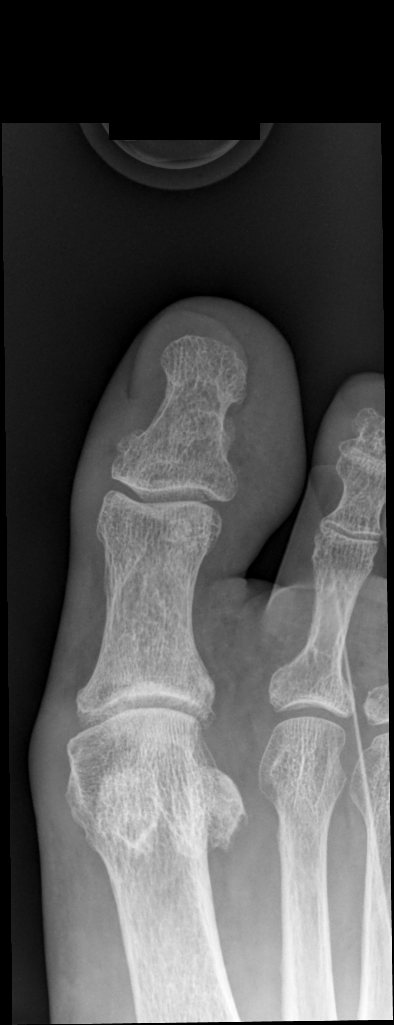

[toes lat]
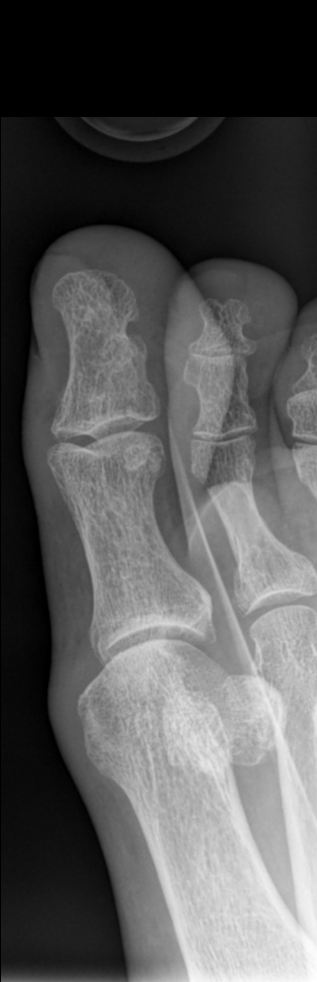

[3 of 3 positions shown; findings below may reference images not displayed]

FINDINGS: Limited lateral view, which is actually oblique.

Incomplete lucency through the lateral base of the great toe distal
phalanx. No dislocation.
IMPRESSION: 1. Equivocal for nondisplaced fracture at the base of the distal
phalanx.
2. Limited lateral view.

## 2021-04-24 ENCOUNTER — Encounter: Payer: Self-pay | Admitting: Internal Medicine

## 2021-04-24 ENCOUNTER — Ambulatory Visit (INDEPENDENT_AMBULATORY_CARE_PROVIDER_SITE_OTHER): Payer: HMO | Admitting: Internal Medicine

## 2021-04-24 VITALS — BP 140/66 | HR 61 | Temp 98.7°F | Ht 65.0 in | Wt 169.1 lb

## 2021-04-24 DIAGNOSIS — E861 Hypovolemia: Secondary | ICD-10-CM | POA: Diagnosis not present

## 2021-04-24 DIAGNOSIS — I9589 Other hypotension: Secondary | ICD-10-CM | POA: Diagnosis not present

## 2021-04-24 DIAGNOSIS — E559 Vitamin D deficiency, unspecified: Secondary | ICD-10-CM

## 2021-04-24 DIAGNOSIS — E876 Hypokalemia: Secondary | ICD-10-CM | POA: Diagnosis not present

## 2021-04-24 DIAGNOSIS — N170 Acute kidney failure with tubular necrosis: Secondary | ICD-10-CM

## 2021-04-24 DIAGNOSIS — I10 Essential (primary) hypertension: Secondary | ICD-10-CM | POA: Diagnosis not present

## 2021-04-24 DIAGNOSIS — Z09 Encounter for follow-up examination after completed treatment for conditions other than malignant neoplasm: Secondary | ICD-10-CM | POA: Diagnosis not present

## 2021-04-24 MED ORDER — HYDROCHLOROTHIAZIDE 25 MG PO TABS: 25.0000 mg | ORAL_TABLET | Freq: Every day | ORAL | 1 refills | Status: DC

## 2021-04-24 MED ORDER — LOSARTAN POTASSIUM 100 MG PO TABS: 100.0000 mg | ORAL_TABLET | Freq: Every day | ORAL | 1 refills | Status: DC

## 2021-04-24 NOTE — Patient Instructions (Signed)
-  Nice seeing you today!!  -Lab work today; will notify you once results are available.  -Resume HCTZ 25 mg and losartan 100 mg daily. Continue to hold amlodipine and furosemide.  -Schedule follow up in 6 weeks.

## 2021-04-24 NOTE — Progress Notes (Signed)
Hospital follow-up visit     CC/Reason for Visit: Hospitalization follow-up  HPI: Miranda [name] is a 76 y.o. female who is coming in today for the above mentioned reasons, specifically transitional care services face-to-face visit.    Dates hospitalized: 04/14/2021-04/17/2021 Days since discharge from hospital: 7 Patient was discharged from the hospital to: Home Reason for admission to hospital: Hypovolemic hypotension, acute renal failure and multiple electrolyte abnormalities Date of interactive phone contact with patient and/or caregiver: 04/21/2021  I have reviewed in detail patient's discharge summary plus pertinent specific notes, labs, and images from the hospitalization.  Yes  Patient presented to the hospital on January 30 with chest discomfort and dizziness.  She was found to be hypotensive.  In the weeks prior she had had a dental procedure and had not been eating or drinking well as a consequence to this.  In addition she had taken some laxatives for constipation which caused her to have some diarrhea which is hypothesized contributed to her presentation.  She was found to have multiple electrolyte changes including hypokalemia, hypocalcemia, hypomagnesemia in addition to acute renal failure.  Upon discharge her antihypertensives were discontinued including hydrochlorothiazide, losartan, Lasix, amlodipine.  She is here today for follow-up, she is feeling improved.  Medication reconciliation was done today and patient is taking meds as recommended by discharging hospitalist/specialist.  Yes   Past Medical/Surgical History: Past Medical History:  Diagnosis Date   Anxiety    Arthritis    Asthma    Atrial fibrillation (HCC)    CHF (congestive heart failure) ([place])    pt. unsure- but thinks she was hosp. for CHF- 2002   [facility] kidney disease    [facility]   Clotting disorder ([place])    blood clots in lungs/PE pulmonary embolism   Colon polyps    Complication  of anesthesia    states requires a lot med. to put her to sleep    DDD (degenerative disc disease) 09/17/2011   Depression    "sometimes "   DM (diabetes mellitus) ([place])    Family history of anesthesia complication    Sister had difficulty waking up from anesthesia   GERD (gastroesophageal reflux disease)    Glaucoma    bilateral, pt. admits that she is noncompliant to eye gtts.    Hemorrhoids    Hyperlipidemia    Hypertension    had stress, echo- 2006 /w [place], Cardiac Cath, per pt. 2002, echo repeated 2012- wnl    IPF (idiopathic pulmonary fibrosis) (HCC)    Low back pain    Shortness of breath    Sleep apnea    uses c-pap- q night recently    Past Surgical History:  Procedure Laterality Date   ABDOMINAL HYSTERECTOMY     ectopic, fibroids   ANTERIOR CERVICAL DECOMP/DISCECTOMY FUSION N/A 09/11/2020   Procedure: Anterior Cervical Decompression Fusion Cervical four-five, Cervical five-six;  Surgeon: [name] Mare, MD;  Location: [place];  Service: Neurosurgery;  Laterality: N/A;   arm surgery Right    CARDIAC CATHETERIZATION     CATARACT EXTRACTION, BILATERAL     cataracts removed bilateral- ?IOL   COLONOSCOPY     remote   FISSURECTOMY  10/08/2011   Procedure: FISSURECTOMY;  Surgeon: Stark Klein, MD;  Location: [place];  Service: General;  Laterality: N/A;   FLEXIBLE SIGMOIDOSCOPY  02/25/2011   Procedure: FLEXIBLE SIGMOIDOSCOPY;  Surgeon: Inda Castle, MD;  Location: [facility];  Service: Endoscopy;  Laterality: N/A;  FOOT SURGERY     bilat, heel spurs- screw in R foot    HEMORRHOID SURGERY  10/08/2011   Procedure: HEMORRHOIDECTOMY;  Surgeon: Stark Klein, MD;  Location: [place];  Service: General;  Laterality: N/A;  External    ROTATOR CUFF REPAIR Right    SPHINCTEROTOMY  10/08/2011   Procedure: SPHINCTEROTOMY;  Surgeon: Stark Klein, MD;  Location: [place];  Service: General;  Laterality: N/A;    Social History:  reports that she has never smoked. She has never  used smokeless tobacco. She reports that she does not drink alcohol and does not use drugs.  Allergies: Allergies  Allergen Reactions   Fish Oil Anaphylaxis   Other Hives, Shortness Of Breath and Swelling    Allergic to cashew nuts and peanut oil.   Penicillins Anaphylaxis, Hives and Swelling    Has patient had a PCN reaction causing immediate rash, facial/tongue/throat swelling, SOB or lightheadedness with hypotension: Face swelling and hives started first, then swelling of the throat  Has patient had a PCN reaction causing severe rash involving mucus membranes or skin necrosis: Yes  Has patient had a PCN reaction that required hospitalization: No  Has patient had a PCN reaction occurring within the last 10 years: Yes  If all of the above answers are "NO", then may proceed with Cephalospor   Pneumococcal Vaccines Nausea And Vomiting   Cashew Nut Oil    Nitrofurantoin Macrocrystal Hives   Peanut Oil    Aspirin Itching and Rash   Bactrim [Sulfamethoxazole-Trimethoprim] Hives, Itching and Rash   Ciprofloxacin Hives, Itching and Rash   Ibuprofen Rash   Influenza Vaccines Hives   Ivp Dye [Iodinated Contrast Media] Hives, Itching and Rash    Gives benadryl to counteract symptoms   Latex Rash   Macrobid [Nitrofurantoin Monohydrate Macrocrystals] Hives   Shellfish Allergy Hives    Patient also allergic to seafood    Family History:  Family History  Problem Relation Age of Onset   Heart attack Mother    Heart disease Mother    Breast cancer Mother    Emphysema Sister    Breast cancer Sister    Arthritis/Rheumatoid Sister    Asthma Sister    Lung cancer Sister    COPD Sister    Colon cancer Brother    Colon cancer Brother    Anesthesia problems Neg Hx    Esophageal cancer Neg Hx    Rectal cancer Neg Hx    Stomach cancer Neg Hx      Current Outpatient Medications:    Accu-Chek Softclix Lancets lancets, Use as instructed, Disp: 100 each, Rfl: 12   acetaminophen (TYLENOL)  325 MG tablet, Take 650 mg by mouth every 6 (six) hours as needed for moderate pain., Disp: , Rfl:    albuterol (VENTOLIN HFA) 108 (90 Base) MCG/ACT inhaler, Inhale 1 puff into the lungs every 6 (six) hours as needed., Disp: 18 g, Rfl: 1   ALPRAZolam (XANAX) 0.25 MG tablet, TAKE ONE TABLET BY MOUTH three times daily AS NEEDED (Patient taking differently: Take 0.25 mg by mouth 3 (three) times daily as needed for anxiety.), Disp: 90 tablet, Rfl: 0   atorvastatin (LIPITOR) 80 MG tablet, TAKE ONE TABLET BY MOUTH EVERY EVENING (Patient taking differently: Take 80 mg by mouth every evening.), Disp: 90 tablet, Rfl: 1   Continuous Blood Gluc Receiver (FREESTYLE LIBRE 14 DAY READER) DEVI, 1 each by Does not apply route daily., Disp: 1 each, Rfl: 2   Continuous Blood Gluc  Sensor (FREESTYLE LIBRE 14 DAY SENSOR) MISC, USE 1  ONCE DAILY, Disp: 2 each, Rfl: 5   cyclobenzaprine (FLEXERIL) 5 MG tablet, TAKE ONE TABLET BY MOUTH EVERY EVENING (Patient taking differently: Take 5 mg by mouth at bedtime.), Disp: 90 tablet, Rfl: 1   dronedarone (MULTAQ) 400 MG tablet, TAKE 1 TABLET BY MOUTH TWICE DAILY WITH A MEAL (Patient taking differently: Take 400 mg by mouth 2 (two) times daily with a meal.), Disp: 180 tablet, Rfl: 3   ESBRIET 801 MG TABS, TAKE 1 TABLET BY MOUTH 3 TIMES A DAY., Disp: 90 tablet, Rfl: 0   ezetimibe (ZETIA) 10 MG tablet, TAKE ONE TABLET BY MOUTH ONCE DAILY (Patient taking differently: Take 10 mg by mouth daily.), Disp: 90 tablet, Rfl: 1   hydrochlorothiazide (HYDRODIURIL) 25 MG tablet, Take 1 tablet (25 mg total) by mouth daily., Disp: 90 tablet, Rfl: 1   HYDROcodone-acetaminophen (NORCO) 10-325 MG tablet, Take 1 tablet by mouth every 6 (six) hours as needed for pain., Disp: , Rfl:    lactulose (CONSTULOSE) 10 GM/15ML solution, TAKE 30 MILLILITERS BY MOUTH  THREE TIMES DAILY AS NEEDED FOR MILD CONSTIPATION, Disp: 473 mL, Rfl: 1   linaclotide (LINZESS) 145 MCG CAPS capsule, Take 1 capsule (145 mcg total) by  mouth daily before breakfast., Disp: 30 capsule, Rfl: 2   losartan (COZAAR) 100 MG tablet, Take 1 tablet (100 mg total) by mouth daily., Disp: 90 tablet, Rfl: 1   metFORMIN (GLUCOPHAGE-XR) 500 MG 24 hr tablet, TAKE ONE TABLET BY MOUTH EVERY MORNING (Patient taking differently: Take 500 mg by mouth daily with breakfast.), Disp: 90 tablet, Rfl: 0   XARELTO 20 MG TABS tablet, TAKE ONE TABLET BY MOUTH ONCE DAILY WITH SUPPER (Patient taking differently: Take 20 mg by mouth daily with supper.), Disp: 90 tablet, Rfl: 1  Review of Systems:  Constitutional: Denies fever, chills, diaphoresis, appetite change and fatigue.  HEENT: Denies photophobia, eye pain, redness, hearing loss, ear pain, congestion, sore throat, rhinorrhea, sneezing, mouth sores, trouble swallowing, neck pain, neck stiffness and tinnitus.   Respiratory: Denies SOB, DOE, cough, chest tightness,  and wheezing.   Cardiovascular: Denies chest pain, palpitations and leg swelling.  Gastrointestinal: Denies nausea, vomiting, abdominal pain, diarrhea, constipation, blood in stool and abdominal distention.  Genitourinary: Denies dysuria, urgency, frequency, hematuria, flank pain and difficulty urinating.  Endocrine: Denies: hot or cold intolerance, sweats, changes in hair or nails, polyuria, polydipsia. Musculoskeletal: Denies myalgias, back pain, joint swelling, arthralgias and gait problem.  Skin: Denies pallor, rash and wound.  Neurological: Denies dizziness, seizures, syncope, weakness, light-headedness, numbness and headaches.  Hematological: Denies adenopathy. Easy bruising, personal or family bleeding history  Psychiatric/Behavioral: Denies suicidal ideation, mood changes, confusion, nervousness, sleep disturbance and agitation    Physical Exam: Vitals:   04/24/21 1431 04/24/21 1439  BP: (!) 148/64 140/66  Pulse: 61   Temp: 98.7 F (37.1 C)   TempSrc: Oral   SpO2: 99%   Weight: 169 lb 1.6 oz (76.7 kg)   Height: 5\' 5"  (1.651 m)      Body mass index is 28.14 kg/m.   Constitutional: NAD, calm, comfortable Eyes: PERRL, lids and conjunctivae normal ENMT: Mucous membranes are moist.  Respiratory: clear to auscultation bilaterally, no wheezing, no crackles. Normal respiratory effort. No accessory muscle use.  Cardiovascular: Regular rate and rhythm, no murmurs / rubs / gallops. No extremity edema.  Neurologic: Grossly intact and nonfocal Psychiatric: Normal judgment and insight. Alert and oriented x 3. Normal mood.  Impression and Plan:  Hospital discharge follow-up Acute renal failure with tubular necrosis ([place]) - Plan: Comprehensive metabolic panel Hypokalemia Hypocalcemia Hypotension due to hypovolemia Essential hypertension - Plan: losartan (COZAAR) 100 MG tablet, hydrochlorothiazide (HYDRODIURIL) 25 MG tablet Hypomagnesemia - Plan: Magnesium  -Hospital charts have been reviewed in great detail today. -Labs will be ordered today as requested by discharging hospitalist to follow on electrolytes and renal function. -Blood pressure is noted to be elevated in office today to 148/70. -I will add back her hydrochlorothiazide and losartan, keep holding on her Lasix and amlodipine and I will have her return in 6 weeks for follow-up. -I have also communicated with our pharmacist to see how we can incorporate these 2 medications into her pill pack [facility].                                        Medical decision making of high complexity was utilized today.  Patient Instructions  -Nice seeing you today!!  -Lab work today; will notify you once results are available.  -Resume HCTZ 25 mg and losartan 100 mg daily. Continue to hold amlodipine and furosemide.  -Schedule follow up in 6 weeks.    [name], MD [facility]

## 2021-04-25 LAB — COMPREHENSIVE METABOLIC PANEL
ALT: 24 U/L (ref 0–35)
AST: 26 U/L (ref 0–37)
Albumin: 3.9 g/dL (ref 3.5–5.2)
Alkaline Phosphatase: 36 U/L — ABNORMAL LOW (ref 39–117)
BUN: 9 mg/dL (ref 6–23)
CO2: 26 mEq/L (ref 19–32)
Calcium: 9.4 mg/dL (ref 8.4–10.5)
Chloride: 107 mEq/L (ref 96–112)
Creatinine, Ser: 0.88 mg/dL (ref 0.40–1.20)
GFR: 64.67 mL/min (ref >60.00)
Glucose, Bld: 76 mg/dL (ref 70–99)
Potassium: 4.3 mEq/L (ref 3.5–5.1)
Sodium: 140 mEq/L (ref 135–145)
Total Bilirubin: 0.5 mg/dL (ref 0.2–1.2)
Total Protein: 7.3 g/dL (ref 6.0–8.3)

## 2021-04-25 LAB — MAGNESIUM: Magnesium: 2.1 mg/dL (ref 1.5–2.5)

## 2021-04-25 LAB — VITAMIN D 25 HYDROXY (VIT D DEFICIENCY, FRACTURES): VITD: 27.69 ng/mL — ABNORMAL LOW (ref 30.00–100.00)

## 2021-04-25 NOTE — Progress Notes (Signed)
Per [facility] Acute form for fill and sync of HCTZ and Losartan to pill packaging with [name] with delivery date of 04/25/21 complete   [facility] Clinical Pharmacist Assistant [phone]

## 2021-04-28 ENCOUNTER — Other Ambulatory Visit: Payer: Self-pay | Admitting: Internal Medicine

## 2021-04-28 DIAGNOSIS — E559 Vitamin D deficiency, unspecified: Secondary | ICD-10-CM

## 2021-04-28 MED ORDER — VITAMIN D (ERGOCALCIFEROL) 1.25 MG (50000 UNIT) PO CAPS: 50000.0000 [IU] | ORAL_CAPSULE | ORAL | 0 refills | Status: AC

## 2021-04-30 ENCOUNTER — Other Ambulatory Visit: Payer: Self-pay

## 2021-04-30 ENCOUNTER — Encounter: Payer: Self-pay | Admitting: Pulmonary Disease

## 2021-04-30 ENCOUNTER — Ambulatory Visit: Payer: HMO | Admitting: Pulmonary Disease

## 2021-04-30 VITALS — BP 130/68 | HR 63 | Temp 98.6°F | Ht 66.0 in | Wt 163.2 lb

## 2021-04-30 DIAGNOSIS — Z5181 Encounter for therapeutic drug level monitoring: Secondary | ICD-10-CM | POA: Diagnosis not present

## 2021-04-30 DIAGNOSIS — J84112 Idiopathic pulmonary fibrosis: Secondary | ICD-10-CM | POA: Diagnosis not present

## 2021-04-30 NOTE — Progress Notes (Signed)
Miranda [name]    [id number]    June 27, 1945  Primary Care Physician:Hernandez Everardo Beals, MD  Referring Physician: [name], [name], MD [address] [place],  [place] [address]  Chief complaint: Follow-up for interstitial lung disease, OSA  HPI: Mrs. Campo is here for follow-up of obstructive sleep apnea, pulmonary fibrosis. PE in 2018 opn anticoagulation, proxysmal atrial fib She has history of UIP fibrosis.  There is history of rheumatoid arthritis in chart but connective tissue serologies are negative with no arthritis, joint pain symptoms.  Pulmonary fibrosis is felt to be more consistent with IPF and she has been on Esbriet since 2019  Pets: Has a dog.  No cats, birds, farm animals Occupation: Retired Quarry manager Exposures: Has feather pillows at home but not in her bedroom.  No mold, hot tub, Jacuzzi Smoking history: Never smoker Travel history: No significant travel history Relevant family history: No significant family history of lung disease  Interim history: Continues on Esbriet without issue  She was hospitalized in early February 2023 for dehydration, AKI and rapid atrial fibrillation.  Overall improved since discharge  Outpatient Encounter Medications as of 04/30/2021  Medication Sig   Accu-Chek Softclix Lancets lancets Use as instructed   acetaminophen (TYLENOL) 325 MG tablet Take 650 mg by mouth every 6 (six) hours as needed for moderate pain.   albuterol (VENTOLIN HFA) 108 (90 Base) MCG/ACT inhaler Inhale 1 puff into the lungs every 6 (six) hours as needed.   ALPRAZolam (XANAX) 0.25 MG tablet TAKE ONE TABLET BY MOUTH three times daily AS NEEDED (Patient taking differently: Take 0.25 mg by mouth 3 (three) times daily as needed for anxiety.)   atorvastatin (LIPITOR) 80 MG tablet TAKE ONE TABLET BY MOUTH EVERY EVENING (Patient taking differently: Take 80 mg by mouth every evening.)   Continuous Blood Gluc Receiver (FREESTYLE LIBRE 14 DAY  READER) DEVI 1 each by Does not apply route daily.   Continuous Blood Gluc Sensor (FREESTYLE LIBRE 14 DAY SENSOR) MISC USE 1  ONCE DAILY   cyclobenzaprine (FLEXERIL) 5 MG tablet TAKE ONE TABLET BY MOUTH EVERY EVENING (Patient taking differently: Take 5 mg by mouth at bedtime.)   dronedarone (MULTAQ) 400 MG tablet TAKE 1 TABLET BY MOUTH TWICE DAILY WITH A MEAL (Patient taking differently: Take 400 mg by mouth 2 (two) times daily with a meal.)   ESBRIET 801 MG TABS TAKE 1 TABLET BY MOUTH 3 TIMES A DAY.   ezetimibe (ZETIA) 10 MG tablet TAKE ONE TABLET BY MOUTH ONCE DAILY (Patient taking differently: Take 10 mg by mouth daily.)   hydrochlorothiazide (HYDRODIURIL) 25 MG tablet Take 1 tablet (25 mg total) by mouth daily.   HYDROcodone-acetaminophen (NORCO) 10-325 MG tablet Take 1 tablet by mouth every 6 (six) hours as needed for pain.   lactulose (CONSTULOSE) 10 GM/15ML solution TAKE 30 MILLILITERS BY MOUTH  THREE TIMES DAILY AS NEEDED FOR MILD CONSTIPATION   linaclotide (LINZESS) 145 MCG CAPS capsule Take 1 capsule (145 mcg total) by mouth daily before breakfast.   losartan (COZAAR) 100 MG tablet Take 1 tablet (100 mg total) by mouth daily.   metFORMIN (GLUCOPHAGE-XR) 500 MG 24 hr tablet TAKE ONE TABLET BY MOUTH EVERY MORNING (Patient taking differently: Take 500 mg by mouth daily with breakfast.)   Vitamin D, Ergocalciferol, (DRISDOL) 1.25 MG (50000 UNIT) CAPS capsule Take 1 capsule (50,000 Units total) by mouth every 7 (seven) days for 12 doses.   XARELTO 20 MG  TABS tablet TAKE ONE TABLET BY MOUTH ONCE DAILY WITH SUPPER (Patient taking differently: Take 20 mg by mouth daily with supper.)   No facility-administered encounter medications on file as of 04/30/2021.   Physical Exam: Blood pressure 130/68, pulse 63, temperature 98.6 F (37 C), temperature source Oral, height 5\' 6"  (1.676 m), weight 163 lb 3.2 oz (74 kg), SpO2 96 %. Gen:      No acute distress HEENT:  EOMI, sclera anicteric Neck:     No  masses; no thyromegaly Lungs:    Clear to auscultation bilaterally; normal respiratory effort CV:         Regular rate and rhythm; no murmurs Abd:      + bowel sounds; soft, non-tender; no palpable masses, no distension Ext:    No edema; adequate peripheral perfusion Skin:      Warm and dry; no rash Neuro: alert and oriented x 3 Psych: normal mood and affect   Data Reviewed: Imaging: High-res CT 12/09/2018- mild pulmonary fibrosis and basilar predominance.  Traction bronchiectasis, honeycombing.  Unchanged compared to 2017  High-res CT 12/15/2019-unchanged UIP pulmonary fibrosis I have reviewed all images personally  PFTs: 06/14/2017 FVC 2.21 [89%], FEV1 1.88 [98%],/55, TLC 3.68 [70%], DLCO 14.69 [57%] Mild restriction with moderate diffusion impairment.  Stable compared to 2017  10/15/2019 FVC 2.10 [83%), FEV1 1.42 [73%], F/F 68, TLC 3.77 70%], DLCO 16.51 [80%] Minimal restriction  Labs: CTD serologies including CCP, rheumatoid factor 2017, 2018, 2019-negative Comprehensive metabolic panel 06/22/1446- normal.  Sleep study: 10/2016 PSG> AHI 10.5   CPAP download 12/19/2018-77% compliance.  Residual AHI 2  Cardiac: Echocardiogram 10/19/2017-LVEF 60 to 18%, grade 1 diastolic dysfunction, mild RV dilatation.  Assessment:  Pulmonary fibrosis Although she has history of rheumatoid arthritis in the chart she does not have any symptoms.  And has negative serologies.  Presentation is consistent with IPF Her lung function and CT scan have remained stable on Esbriet.  She has developed a rash but I doubt this is from [place] as she has been on this therapy since 2019  Monitor hepatic panel.  Her tests earlier this month during hospitalization were normal  Evaluation for pulmonary hypertension Does not have lower extremity edema.  Last echocardiogram notes mild RV dilatation though no mention of increased pressures. proBNP is normal  Obstructive sleep apnea Download reviewed with poor  compliance Patient tells me that she sometimes forgets to use it. Encouraged her to CPAP every day without fail.  Remote pulmonary embolism Continue anticoagulation  Health maintenance Has been vaccinated against Covid  Plan/Recommendations: - Continue Esbriet, CPAP - High-res CT, PFTs in 6 months   [name] MD [facility] 04/30/2021, 12:02 PM  CC: [name], [name]

## 2021-04-30 NOTE — Patient Instructions (Signed)
I am glad you are stable with regard to your breathing Continue Esbriet We will order high-res CT and PFTs in 6 months Follow-up in clinic in 6 months

## 2021-05-02 DIAGNOSIS — Z8639 Personal history of other endocrine, nutritional and metabolic disease: Secondary | ICD-10-CM | POA: Diagnosis not present

## 2021-05-02 DIAGNOSIS — I4891 Unspecified atrial fibrillation: Secondary | ICD-10-CM | POA: Diagnosis not present

## 2021-05-05 ENCOUNTER — Telehealth: Payer: Self-pay | Admitting: Pulmonary Disease

## 2021-05-05 ENCOUNTER — Encounter: Payer: Self-pay | Admitting: Pulmonary Disease

## 2021-05-05 NOTE — Telephone Encounter (Signed)
Patient states would like Esbriet to go to [facility] [address]	[place]. Patient phone number is [phone].

## 2021-05-08 ENCOUNTER — Other Ambulatory Visit: Payer: Self-pay

## 2021-05-08 ENCOUNTER — Ambulatory Visit (INDEPENDENT_AMBULATORY_CARE_PROVIDER_SITE_OTHER): Payer: HMO | Admitting: Cardiology

## 2021-05-08 ENCOUNTER — Encounter: Payer: Self-pay | Admitting: Cardiology

## 2021-05-08 ENCOUNTER — Other Ambulatory Visit (HOSPITAL_COMMUNITY): Payer: Self-pay

## 2021-05-08 ENCOUNTER — Ambulatory Visit: Payer: HMO | Admitting: Cardiology

## 2021-05-08 VITALS — BP 124/60 | HR 68 | Ht 66.0 in | Wt 165.2 lb

## 2021-05-08 DIAGNOSIS — I480 Paroxysmal atrial fibrillation: Secondary | ICD-10-CM | POA: Diagnosis not present

## 2021-05-08 DIAGNOSIS — D6869 Other thrombophilia: Secondary | ICD-10-CM

## 2021-05-08 NOTE — Patient Instructions (Addendum)
Medication Instructions:  Your physician recommends that you continue on your current medications as directed. Please refer to the Current Medication list given to you today.  *If you need a refill on your cardiac medications before your next appointment, please call your pharmacy*   Lab Work: None ordered   Testing/Procedures: None ordered   Follow-Up: At [facility], you and your health needs are our priority.  As part of our continuing mission to provide you with exceptional heart care, we have created designated Provider Care Teams.  These Care Teams include your primary Cardiologist (physician) and Advanced Practice Providers (APPs -  Physician Assistants and Nurse Practitioners) who all work together to provide you with the care you need, when you need it.  We recommend signing up for the patient portal called "MyChart".  Sign up information is provided on this After Visit Summary.  MyChart is used to connect with patients for Virtual Visits (Telemedicine).  Patients are able to view lab/test results, encounter notes, upcoming appointments, etc.  Non-urgent messages can be sent to your provider as well.   To learn more about what you can do with MyChart, go to [email or web address].    Your next appointment:   6 month(s)  The format for your next appointment:   In Person  Provider:   EP APP    Thank you for choosing [facility]!!   Trinidad Curet, RN [phone]    EYQ 32 oz water bottle with time marker, carry strap [place]/Green gradient color $10

## 2021-05-08 NOTE — Telephone Encounter (Signed)
Attempted to submit PA via CMM. Request cancelled due to being a duplicate request for an already approved medication. Test claim verifies that medication is covered and copay is $4.15. No additional PA is required at this time.

## 2021-05-08 NOTE — Progress Notes (Signed)
Electrophysiology Office Note   Date:  05/08/2021   ID:  Miranda, [name] [birth year], MRN [id number]  PCP:  [name], [name], MD  Cardiologist:  [name] Primary Electrophysiologist:  Tajha Sammarco Meredith Leeds, MD    No chief complaint on file.     History of Present Illness: Miranda [name] is a 76 y.o. female who is being seen today for the evaluation of atrial fibrillation at the request of [name]. Presenting today for electrophysiology evaluation.    Significant for depression, GERD, hypertension, OSA on CPAP, diabetes, atrial fibrillation, coronary calcifications found on CT.  She was diagnosed with PE in 2018 and started on Xarelto.  She went to the emergency room and was found to be in atrial fibrillation.  She is now on Multaq.  She was hospitalized January 2023 with chest pain, shortness of breath.  She was found to be in acute renal failure and was rehydrated.  She also had hypokalemia and hypocalcemia which were repleted.  Since her hospitalization, she has had no further symptoms.  She has felt well.  She is overall happy with her atrial fibrillation control.  Today, denies symptoms of palpitations, chest pain, shortness of breath, orthopnea, PND, lower extremity edema, claudication, dizziness, presyncope, syncope, bleeding, or neurologic sequela. The patient is tolerating medications without difficulties.     Past Medical History:  Diagnosis Date   Anxiety    Arthritis    Asthma    Atrial fibrillation (HCC)    CHF (congestive heart failure) ([place])    pt. unsure- but thinks she was hosp. for CHF- 2002   [facility] disease    [facility]   Clotting disorder ([place])    blood clots in lungs/PE pulmonary embolism   Colon polyps    Complication of anesthesia    states requires a lot med. to put her to sleep    DDD (degenerative disc disease) 09/17/2011   Depression    "sometimes "   DM (diabetes mellitus) ([place])    Family history of anesthesia  complication    Sister had difficulty waking up from anesthesia   GERD (gastroesophageal reflux disease)    Glaucoma    bilateral, pt. admits that she is noncompliant to eye gtts.    Hemorrhoids    Hyperlipidemia    Hypertension    had stress, echo- 2006 /w , Cardiac Cath, per pt. 2002, echo repeated 2012- wnl    IPF (idiopathic pulmonary fibrosis) (HCC)    Low back pain    Shortness of breath    Sleep apnea    uses c-pap- q night recently   Past Surgical History:  Procedure Laterality Date   ABDOMINAL HYSTERECTOMY     ectopic, fibroids   ANTERIOR CERVICAL DECOMP/DISCECTOMY FUSION N/A 09/11/2020   Procedure: Anterior Cervical Decompression Fusion Cervical four-five, Cervical five-six;  Surgeon: [name] Mare, MD;  Location: [facility];  Service: Neurosurgery;  Laterality: N/A;   arm surgery Right    CARDIAC CATHETERIZATION     CATARACT EXTRACTION, BILATERAL     cataracts removed bilateral- ?IOL   COLONOSCOPY     remote   FISSURECTOMY  10/08/2011   Procedure: FISSURECTOMY;  Surgeon: Stark Klein, MD;  Location: [facility];  Service: General;  Laterality: N/A;   FLEXIBLE SIGMOIDOSCOPY  02/25/2011   Procedure: FLEXIBLE SIGMOIDOSCOPY;  Surgeon: Inda Castle, MD;  Location: [facility];  Service: Endoscopy;  Laterality: N/A;   FOOT SURGERY     bilat, heel spurs-  screw in R foot    HEMORRHOID SURGERY  10/08/2011   Procedure: HEMORRHOIDECTOMY;  Surgeon: Stark Klein, MD;  Location: [facility];  Service: General;  Laterality: N/A;  External    ROTATOR CUFF REPAIR Right    SPHINCTEROTOMY  10/08/2011   Procedure: Joan Mayans;  Surgeon: Stark Klein, MD;  Location: [facility];  Service: General;  Laterality: N/A;     Current Outpatient Medications  Medication Sig Dispense Refill   Accu-Chek Softclix Lancets lancets Use as instructed 100 each 12   acetaminophen (TYLENOL) 325 MG tablet Take 650 mg by mouth every 6 (six) hours as needed for moderate pain.     albuterol (VENTOLIN HFA)  108 (90 Base) MCG/ACT inhaler Inhale 1 puff into the lungs every 6 (six) hours as needed. 18 g 1   ALPRAZolam (XANAX) 0.25 MG tablet TAKE ONE TABLET BY MOUTH three times daily AS NEEDED (Patient taking differently: Take 0.25 mg by mouth 3 (three) times daily as needed for anxiety.) 90 tablet 0   atorvastatin (LIPITOR) 80 MG tablet TAKE ONE TABLET BY MOUTH EVERY EVENING 90 tablet 1   Continuous Blood Gluc Receiver (FREESTYLE LIBRE 14 DAY READER) DEVI 1 each by Does not apply route daily. 1 each 2   Continuous Blood Gluc Sensor (FREESTYLE LIBRE 14 DAY SENSOR) MISC USE 1  ONCE DAILY 2 each 5   cyclobenzaprine (FLEXERIL) 5 MG tablet TAKE ONE TABLET BY MOUTH EVERY EVENING (Patient taking differently: Take 5 mg by mouth at bedtime.) 90 tablet 1   dronedarone (MULTAQ) 400 MG tablet TAKE 1 TABLET BY MOUTH TWICE DAILY WITH A MEAL 180 tablet 3   ESBRIET 801 MG TABS TAKE 1 TABLET BY MOUTH 3 TIMES A DAY. 90 tablet 0   ezetimibe (ZETIA) 10 MG tablet TAKE ONE TABLET BY MOUTH ONCE DAILY 90 tablet 1   hydrochlorothiazide (HYDRODIURIL) 25 MG tablet Take 1 tablet (25 mg total) by mouth daily. 90 tablet 1   HYDROcodone-acetaminophen (NORCO) 10-325 MG tablet Take 1 tablet by mouth every 6 (six) hours as needed for pain.     lactulose (CONSTULOSE) 10 GM/15ML solution TAKE 30 MILLILITERS BY MOUTH  THREE TIMES DAILY AS NEEDED FOR MILD CONSTIPATION 473 mL 1   linaclotide (LINZESS) 145 MCG CAPS capsule Take 1 capsule (145 mcg total) by mouth daily before breakfast. 30 capsule 2   losartan (COZAAR) 100 MG tablet Take 1 tablet (100 mg total) by mouth daily. 90 tablet 1   metFORMIN (GLUCOPHAGE-XR) 500 MG 24 hr tablet TAKE ONE TABLET BY MOUTH EVERY MORNING 90 tablet 0   Vitamin D, Ergocalciferol, (DRISDOL) 1.25 MG (50000 UNIT) CAPS capsule Take 1 capsule (50,000 Units total) by mouth every 7 (seven) days for 12 doses. 12 capsule 0   XARELTO 20 MG TABS tablet TAKE ONE TABLET BY MOUTH ONCE DAILY WITH SUPPER 90 tablet 1   No  current facility-administered medications for this visit.    Allergies:   Fish oil, Other, Penicillins, Pneumococcal vaccines, Cashew nut oil, Nitrofurantoin macrocrystal, Peanut oil, Aspirin, Bactrim [sulfamethoxazole-trimethoprim], Ciprofloxacin, Ibuprofen, Influenza vaccines, Ivp dye [iodinated contrast media], Latex, Macrobid [nitrofurantoin monohydrate macrocrystals], and Shellfish allergy   Social History:  The patient  reports that she has never smoked. She has never been exposed to tobacco smoke. She has never used smokeless tobacco. She reports that she does not drink alcohol and does not use drugs.   Family History:  The patient's family history includes Arthritis/Rheumatoid in her sister; Asthma in her sister; Breast cancer in  her mother and sister; COPD in her sister; Colon cancer in her brother and brother; Emphysema in her sister; Heart attack in her mother; Heart disease in her mother; Lung cancer in her sister.   ROS:  Please see the history of present illness.   Otherwise, review of systems is positive for none.   All other systems are reviewed and negative.   PHYSICAL EXAM: VS:  BP 124/60    Pulse 68    Ht 5\' 6"  (1.676 m)    Wt 165 lb 3.2 oz (74.9 kg)    SpO2 99%    BMI 26.66 kg/m  , BMI Body mass index is 26.66 kg/m. GEN: Well nourished, well developed, in no acute distress  HEENT: normal  Neck: no JVD, carotid bruits, or masses Cardiac: RRR; no murmurs, rubs, or gallops,no edema  Respiratory:  clear to auscultation bilaterally, normal work of breathing GI: soft, nontender, nondistended, + BS MS: no deformity or atrophy  Skin: warm and dry Neuro:  Strength and sensation are intact Psych: euthymic mood, full affect  EKG:  EKG is ordered today. Personal review of the ekg ordered shows sinus rhythm, rate 68  Recent Labs: 04/14/2021: B Natriuretic Peptide 42.9; TSH 1.274 04/15/2021: Hemoglobin 10.6; Platelets 151 04/24/2021: ALT 24; BUN 9; Creatinine, Ser 0.88; Magnesium  2.1; Potassium 4.3; Sodium 140    Lipid Panel     Component Value Date/Time   CHOL 130 04/14/2021 0600   CHOL 167 11/26/2017 0908   TRIG 72 04/14/2021 0600   HDL 60 04/14/2021 0600   HDL 61 11/26/2017 0908   CHOLHDL 2.2 04/14/2021 0600   VLDL 14 04/14/2021 0600   LDLCALC 56 04/14/2021 0600   LDLCALC 90 12/01/2019 1025   LDLDIRECT 152.8 03/30/2011 0859     Wt Readings from Last 3 Encounters:  05/08/21 165 lb 3.2 oz (74.9 kg)  04/30/21 163 lb 3.2 oz (74 kg)  04/24/21 169 lb 1.6 oz (76.7 kg)      Other studies Reviewed: Additional studies/ records that were reviewed today include: TTE 04/14/21  Review of the above records today demonstrates:   1. Left ventricular ejection fraction, by estimation, is 55 to 60%. The  left ventricle has normal function. The left ventricle has no regional  wall motion abnormalities. Left ventricular diastolic parameters are  consistent with Grade I diastolic  dysfunction (impaired relaxation).   2. Right ventricular systolic function is normal. The right ventricular  size is normal. There is normal pulmonary artery systolic pressure.   3. The mitral valve is normal in structure. No evidence of mitral valve  regurgitation. No evidence of mitral stenosis.   4. The aortic valve is normal in structure. Aortic valve regurgitation is  not visualized. Aortic valve sclerosis/calcification is present, without  any evidence of aortic stenosis.   5. The inferior vena cava is normal in size with greater than 50%  respiratory variability, suggesting right atrial pressure of 3 mmHg.    Myoview 11/12/17 Nuclear stress EF: 66%. There was no ST segment deviation noted during stress. No T wave inversion was noted during stress. The study is normal. This is a low risk study. The left ventricular ejection fraction is hyperdynamic (>65%).  ASSESSMENT AND PLAN:  1.  Paroxysmal atrial fibrillation: Currently on Xarelto 20 mg daily, Multaq 400 mg twice daily.   CHA2DS2-VASc of 4.  High risk medication monitoring via ECG.  She was potentially having episodes of atrial fibrillation when she was hospitalized in January, but she  has had no further episodes and has been feeling well now that her electrolytes have been repleted and that she has been well-hydrated.    2.  Hypertension: Currently well controlled  3.  Hyperlipidemia: Continue continue Lipitor with a goal LDL of less than 70  4.  Obstructive sleep apnea: CPAP compliance encouraged  5.  Coronary artery disease: Calcified LAD and circumflex found on CT scan.  No current chest pain.   Current medicines are reviewed at length with the patient today.   The patient does not have concerns regarding her medicines.  The following changes were made today: None  Labs/ tests ordered today include:  Orders Placed This Encounter  Procedures   EKG 12-Lead      Disposition:   FU with Mcclellan Demarais 6 months  Signed, Gavino Fouch Meredith Leeds, MD  05/08/2021 2:43 PM     [facility] [address] [place] [address] [phone] (office) [phone] (fax)

## 2021-05-09 ENCOUNTER — Telehealth: Payer: Self-pay | Admitting: Pharmacist

## 2021-05-09 DIAGNOSIS — M5412 Radiculopathy, cervical region: Secondary | ICD-10-CM | POA: Diagnosis not present

## 2021-05-09 NOTE — Progress Notes (Signed)
Chronic Care Management Pharmacy Assistant   Name: Miranda [name]  MRN: [id number] DOB: 11-01-1945  Reason for Encounter: Medication Review Medication Coordination   Conditions to be addressed/monitored: HTN and DMII  Recent office visits:  04/24/21 Miranda [name], Miranda Halsted, MD - Patient presented for hospital discharge follow up. Prescribed HCTZ 25 mg and Losartan Potassium 100 mg.   Recent consult visits:  05/09/21 Miranda Haw, MD (Cardiology) - Patient presented for Secondary hypercoagulable state and other concerns. No medication changes.  04/30/21 Miranda Garfinkel, MD (Pulmonology) - Patient presented for IPF and other concerns. No medication changes.  Hospital visits:  Medication Reconciliation was completed by comparing discharge summary, patients EMR and Pharmacy list, and upon discussion with patient.  Patient presented to [facility] on  04/14/21 due to Chest pain. Patient was present for 3 days.  New?Medications Started at [facility] Discharge:?? -started  none  Medication Changes at Hospital Discharge: -Changed  ALPRAZolam Medications Discontinued at Hospital Discharge: -Stopped  amLODipine 10 MG tablet (NORVASC) diclofenac sodium 1 % Gel (Voltaren) furosemide 20 MG tablet (LASIX) hydrochlorothiazide 25 MG tablet (HYDRODIURIL) losartan 100 MG tablet (COZAAR) polyethylene glycol powder 17 GM/SCOOP Medications that remain the same after Hospital Discharge:??  -All other medications will remain the same.    Medications: Outpatient Encounter Medications as of 05/09/2021  Medication Sig   Accu-Chek Softclix Lancets lancets Use as instructed   acetaminophen (TYLENOL) 325 MG tablet Take 650 mg by mouth every 6 (six) hours as needed for moderate pain.   albuterol (VENTOLIN HFA) 108 (90 Base) MCG/ACT inhaler Inhale 1 puff into the lungs every 6 (six) hours as needed.   ALPRAZolam (XANAX) 0.25 MG tablet TAKE ONE TABLET BY MOUTH three times  daily AS NEEDED (Patient taking differently: Take 0.25 mg by mouth 3 (three) times daily as needed for anxiety.)   atorvastatin (LIPITOR) 80 MG tablet TAKE ONE TABLET BY MOUTH EVERY EVENING   Continuous Blood Gluc Receiver (FREESTYLE LIBRE 14 DAY READER) DEVI 1 each by Does not apply route daily.   Continuous Blood Gluc Sensor (FREESTYLE LIBRE 14 DAY SENSOR) MISC USE 1  ONCE DAILY   cyclobenzaprine (FLEXERIL) 5 MG tablet TAKE ONE TABLET BY MOUTH EVERY EVENING (Patient taking differently: Take 5 mg by mouth at bedtime.)   dronedarone (MULTAQ) 400 MG tablet TAKE 1 TABLET BY MOUTH TWICE DAILY WITH A MEAL   ESBRIET 801 MG TABS TAKE 1 TABLET BY MOUTH 3 TIMES A DAY.   ezetimibe (ZETIA) 10 MG tablet TAKE ONE TABLET BY MOUTH ONCE DAILY   hydrochlorothiazide (HYDRODIURIL) 25 MG tablet Take 1 tablet (25 mg total) by mouth daily.   HYDROcodone-acetaminophen (NORCO) 10-325 MG tablet Take 1 tablet by mouth every 6 (six) hours as needed for pain.   lactulose (CONSTULOSE) 10 GM/15ML solution TAKE 30 MILLILITERS BY MOUTH  THREE TIMES DAILY AS NEEDED FOR MILD CONSTIPATION   linaclotide (LINZESS) 145 MCG CAPS capsule Take 1 capsule (145 mcg total) by mouth daily before breakfast.   losartan (COZAAR) 100 MG tablet Take 1 tablet (100 mg total) by mouth daily.   metFORMIN (GLUCOPHAGE-XR) 500 MG 24 hr tablet TAKE ONE TABLET BY MOUTH EVERY MORNING   Vitamin D, Ergocalciferol, (DRISDOL) 1.25 MG (50000 UNIT) CAPS capsule Take 1 capsule (50,000 Units total) by mouth every 7 (seven) days for 12 doses.   XARELTO 20 MG TABS tablet TAKE ONE TABLET BY MOUTH ONCE DAILY WITH SUPPER   No facility-administered encounter medications on file as  of 05/09/2021.  Reviewed chart prior to disease state call. Spoke with patient regarding BP  Recent Office Vitals: BP Readings from Last 3 Encounters:  05/08/21 124/60  04/30/21 130/68  04/24/21 140/66   Pulse Readings from Last 3 Encounters:  05/08/21 68  04/30/21 63  04/24/21 61     Wt Readings from Last 3 Encounters:  05/08/21 165 lb 3.2 oz (74.9 kg)  04/30/21 163 lb 3.2 oz (74 kg)  04/24/21 169 lb 1.6 oz (76.7 kg)     Kidney Function Lab Results  Component Value Date/Time   CREATININE 0.88 04/24/2021 03:01 PM   CREATININE 1.01 (H) 04/17/2021 06:15 AM   CREATININE 0.93 12/01/2019 10:25 AM   GFR 64.67 04/24/2021 03:01 PM   GFRNONAA 58 (L) 04/17/2021 06:15 AM   GFRAA >60 11/04/2017 04:10 AM    BMP Latest Ref Rng & Units 04/24/2021 04/17/2021 04/16/2021  Glucose 70 - 99 mg/dL 76 89 87  BUN 6 - 23 mg/dL 9 14 13   Creatinine 0.40 - 1.20 mg/dL 0.88 1.01(H) 0.97  BUN/Creat Ratio 6 - 22 (calc) - - -  Sodium 135 - 145 mEq/L 140 140 139  Potassium 3.5 - 5.1 mEq/L 4.3 3.6 3.2(L)  Chloride 96 - 112 mEq/L 107 105 105  CO2 19 - 32 mEq/L 26 28 28   Calcium 8.4 - 10.5 mg/dL 9.4 8.9 8.9    Current antihypertensive regimen:   hydroCHLOROthiazide 25 mg Oral Daily  Losartan Potassium 100 mg Oral Daily How often are you checking your Blood Pressure? weekly Current home BP readings:  BP Readings from Last 3 Encounters:  05/08/21 124/60  04/30/21 130/68  04/24/21 140/66   What recent interventions/DTPs have been made by any provider to improve Blood Pressure control since last CPP Visit: Patient reports none Any recent hospitalizations or ED visits since last visit with CPP? Yes   Adherence Review: Is the patient currently on ACE/ARB medication? Yes Does the patient have >5 day gap between last estimated fill dates? No   Recent Relevant Labs: Lab Results  Component Value Date/Time   HGBA1C 6.0 03/14/2021 10:19 AM   HGBA1C 5.8 (A) 12/03/2020 03:05 PM   HGBA1C 5.8 (A) 06/20/2020 11:16 AM   HGBA1C 6.1 12/01/2019 10:25 AM   HGBA1C 6.0 02/02/2018 12:20 PM    Kidney Function Lab Results  Component Value Date/Time   CREATININE 0.88 04/24/2021 03:01 PM   CREATININE 1.01 (H) 04/17/2021 06:15 AM   CREATININE 0.93 12/01/2019 10:25 AM   GFR 64.67 04/24/2021 03:01 PM    GFRNONAA 58 (L) 04/17/2021 06:15 AM   GFRAA >60 11/04/2017 04:10 AM    Current antihyperglycemic regimen:  Metformin XR 500 mg 1 tablet before breakfast  What recent interventions/DTPs have been made to improve glycemic control:  Patient reports none since pharmacist follow up Have there been any recent hospitalizations or ED visits since last visit with CPP? No Patient denies hypoglycemic symptoms, including None Patient denies hyperglycemic symptoms, including none How often are you checking your blood sugar? in the morning before eating or drinking What are your blood sugars ranging?  Fasting: 162 patient reports that's the highest recently on today was 92 During the week, how often does your blood glucose drop below 70?  Rarely  Adherence Review: Is the patient currently on a STATIN medication? Yes Is the patient currently on ACE/ARB medication? Yes Does the patient have >5 day gap between last estimated fill dates? No Notes: Patient reports she has in the past been prescribed  medication from PCP to help with urinary retention and is down to her last 2 and was wondering if it can be renewed as she has been having to use a pad and having some leakage. She was unable to locate medication vial to tell me the name, advised she would call me if she found it, advised I would also check with MP in the meantime to figure out what the medication was and if it can be renewed.  Reviewed chart for medication changes ahead of medication coordination call.  No OVs, Consults, or hospital visits since last care coordination call/Pharmacist visit.   No medication changes indicated   BP Readings from Last 3 Encounters:  05/08/21 124/60  04/30/21 130/68  04/24/21 140/66    Lab Results  Component Value Date   HGBA1C 6.0 03/14/2021     Patient obtains medications through Adherence Packaging  30 Days   Last adherence delivery included:  (Done with MP during visit 2/23)  Patient is due for  next adherence delivery on: 05/21/21. Called patient and reviewed medications and coordinated delivery. Confirmed packaging for 30 DS  This delivery to include: Alprazolam (Xanax) 0.25 mg : Take 1 tab 3 times daily as needed Ezetimbe (Zetia) 10 mg : take one tab at breakfast Cyclobenzaprine (Flexiril)  5 mg : take one tab at evening meal Atorvastatin (Lipitor) 80 mg : take one tab at evening meal Xarelto 20 mg : take one tab at evening meal Linzess 145 mcg Take one capsule before breakfast Dronedarone (Multaq) 400 mg : take one tab at lunch and one at evening meal Hydrochlorothiazide 25 mg : take one tab at breakfast Losartan Potassium (Cozaar) 100 mg :  take one tab at evening meal Freestyle Libre Sensors    HYDROCODONE/ACETAMINOPHEN 10-325 T 04/17/2021 15 60 each [name], MD [place]      Confirmed delivery date of 05/21/21, advised patient that pharmacy will contact them the morning of delivery.     Care Gaps: Hepatitis C Screening - Overdue TDAP - Overdue Zoster Vaccine - Overdue Foot Exam - Overdue PNA Vaccine - Overdue BP- 124/60 ( 05/08/21) CCM- 6/23 AWV-Last 2021- office aware to schedule as of 9/22 Lab Results  Component Value Date   HGBA1C 6.0 03/14/2021     Star Rating Drugs: Metformin (Glucophage XR) 500 mg - Last filled 02/17/2021 30 DS at [name] Losartan (Cozaar) 100 mg - Last filled 03/22/2020 30 DS at [name] Atorvastatin (Lipitor) 80 mg - Last filled 03/22/2020 30 DS at [facility] Pharmacist Assistant [phone]

## 2021-05-12 ENCOUNTER — Telehealth: Payer: Self-pay | Admitting: Pulmonary Disease

## 2021-05-12 ENCOUNTER — Other Ambulatory Visit (HOSPITAL_COMMUNITY): Payer: Self-pay

## 2021-05-12 ENCOUNTER — Other Ambulatory Visit: Payer: Self-pay | Admitting: Internal Medicine

## 2021-05-12 DIAGNOSIS — F419 Anxiety disorder, unspecified: Secondary | ICD-10-CM

## 2021-05-12 DIAGNOSIS — J84112 Idiopathic pulmonary fibrosis: Secondary | ICD-10-CM

## 2021-05-12 DIAGNOSIS — I480 Paroxysmal atrial fibrillation: Secondary | ICD-10-CM

## 2021-05-12 MED ORDER — PIRFENIDONE 801 MG PO TABS: 1.0000 | ORAL_TABLET | Freq: Three times a day (TID) | ORAL | 1 refills | Status: DC

## 2021-05-12 NOTE — Telephone Encounter (Signed)
Rx for generic pirfenidone sent to [facility]. Patient provided with pharmacy phone number to schedule shipment  Knox Saliva, PharmD, MPH, BCPS Clinical Pharmacist (Rheumatology and Pulmonology)

## 2021-05-12 NOTE — Telephone Encounter (Signed)
Rx for pirfenidone (generic) sent to [facility]. Called patient to advise and provided with pharmacy phone number to schedule shipment  Knox Saliva, PharmD, MPH, BCPS Clinical Pharmacist (Rheumatology and Pulmonology)

## 2021-05-13 DIAGNOSIS — E1169 Type 2 diabetes mellitus with other specified complication: Secondary | ICD-10-CM | POA: Diagnosis not present

## 2021-05-13 DIAGNOSIS — I10 Essential (primary) hypertension: Secondary | ICD-10-CM | POA: Diagnosis not present

## 2021-05-14 ENCOUNTER — Other Ambulatory Visit: Payer: Self-pay

## 2021-05-14 ENCOUNTER — Ambulatory Visit: Payer: HMO | Admitting: Physician Assistant

## 2021-05-14 ENCOUNTER — Encounter: Payer: Self-pay | Admitting: Physician Assistant

## 2021-05-14 VITALS — BP 140/70 | HR 56 | Ht 65.0 in | Wt 165.8 lb

## 2021-05-14 DIAGNOSIS — E785 Hyperlipidemia, unspecified: Secondary | ICD-10-CM

## 2021-05-14 DIAGNOSIS — I10 Essential (primary) hypertension: Secondary | ICD-10-CM

## 2021-05-14 DIAGNOSIS — I480 Paroxysmal atrial fibrillation: Secondary | ICD-10-CM | POA: Diagnosis not present

## 2021-05-14 DIAGNOSIS — E119 Type 2 diabetes mellitus without complications: Secondary | ICD-10-CM

## 2021-05-14 NOTE — Patient Instructions (Signed)
Medication Instructions:  ?Your physician recommends that you continue on your current medications as directed. Please refer to the Current Medication list given to you today. ? ?*If you need a refill on your cardiac medications before your next appointment, please call your pharmacy* ? ? ?Lab Work: ?NONE ordered at this time of appointment  ? ? ?Testing/Procedures: ?NONE ordered at this time of appointment  ? ? ?Follow-Up: ?At [facility], you and your health needs are our priority.  As part of our continuing mission to provide you with exceptional heart care, we have created designated Provider Care Teams.  These Care Teams include your primary Cardiologist (physician) and Advanced Practice Providers (APPs -  Physician Assistants and Nurse Practitioners) who all work together to provide you with the care you need, when you need it. ? ?We recommend signing up for the patient portal called "MyChart".  Sign up information is provided on this After Visit Summary.  MyChart is used to connect with patients for Virtual Visits (Telemedicine).  Patients are able to view lab/test results, encounter notes, upcoming appointments, etc.  Non-urgent messages can be sent to your provider as well.   ?To learn more about what you can do with MyChart, go to [email or web address].   ? ?Your next appointment:   ?4-6 month(s) ? ?The format for your next appointment:   ?In Person ? ?Provider:   ?[name], MD   ? ? ?Other Instructions ? ? ?

## 2021-05-14 NOTE — Progress Notes (Signed)
Cardiology Office Note:    Date:  05/16/2021   ID:  JNAE THOMASTON, DOB 06-09-1945, MRN [id number]  PCP:  [name], [name], MD   [facility] Providers Cardiologist:  [name], MD Electrophysiologist:  Will Meredith Leeds, MD     Referring MD: [name], [name]   Chief Complaint  Patient presents with   Follow-up    Seen for Dr. [name]    History of Present Illness:    Miranda [name] is a 76 y.o. female with a hx of hypertension, hyperlipidemia, OSA on CPAP, DM 2, and atrial fibrillation.  Myoview in March 2016 showed EF 69%, normal perfusion and wall motion.  She was diagnosed with acute PE in February 2018 and was treated with 36-month course of Xarelto.  High-resolution CT in February 2018 revealed interstitial lung disease as well as aortic atherosclerosis and two-vessel CAD.  Echocardiogram on 05/02/2016 showed EF 60 to 65%, grade 1 DD, moderate LAE.  Repeat CTA of chest in July 2019 showed resolution of PE.  She had atrial fibrillation while getting ready for colonoscopy in July 2019.  She spontaneously converted in the emergency room.  She was restarted on Xarelto and metoprolol.  EF was normal on echocardiogram.  Myoview was normal.  She was last seen by Dr. [name] in February 2022 at which time she was doing well.  She was hospitalized in January 2023 with chest pain shortness of breath and found to have acute renal failure and felt to be dehydrated.  She was given IV fluid.  Echocardiogram was normal.  She also had hypokalemia and hypocalcemia.  She is currently on Multaq to 400 mg twice a day to help control PAF.  Patient has been doing well since discharge from the hospital.  She drinks roughly about 64 ounce of fluid per day.  She does not have any lower extremity edema, orthopnea or PND.  Blood pressure is borderline high today, I recommended continue on the current therapy for now and reassess on the next visit.  Repeat blood work since discharge showed  stable renal function, improved calcium and potassium level.  Overall she is doing well from the cardiac perspective and can follow-up in 4 to 6 months with Dr. [name].   Past Medical History:  Diagnosis Date   Anxiety    Arthritis    Asthma    Atrial fibrillation (HCC)    CHF (congestive heart failure) ([place])    pt. unsure- but thinks she was hosp. for CHF- 2002   [facility]   Clotting disorder ([place])    blood clots in lungs/PE pulmonary embolism   Colon polyps    Complication of anesthesia    states requires a lot med. to put her to sleep    DDD (degenerative disc disease) 09/17/2011   Depression    "sometimes "   DM (diabetes mellitus) ([place])    Family history of anesthesia complication    Sister had difficulty waking up from anesthesia   GERD (gastroesophageal reflux disease)    Glaucoma    bilateral, pt. admits that she is noncompliant to eye gtts.    Hemorrhoids    Hyperlipidemia    Hypertension    had stress, echo- 2006 /w [place], Cardiac Cath, per pt. 2002, echo repeated 2012- wnl    IPF (idiopathic pulmonary fibrosis) (HCC)    Low back pain    Shortness of breath    Sleep apnea  uses c-pap- q night recently    Past Surgical History:  Procedure Laterality Date   ABDOMINAL HYSTERECTOMY     ectopic, fibroids   ANTERIOR CERVICAL DECOMP/DISCECTOMY FUSION N/A 09/11/2020   Procedure: Anterior Cervical Decompression Fusion Cervical four-five, Cervical five-six;  Surgeon: [name] Mare, MD;  Location: [place];  Service: Neurosurgery;  Laterality: N/A;   arm surgery Right    CARDIAC CATHETERIZATION     CATARACT EXTRACTION, BILATERAL     cataracts removed bilateral- ?IOL   COLONOSCOPY     remote   FISSURECTOMY  10/08/2011   Procedure: FISSURECTOMY;  Surgeon: Stark Klein, MD;  Location: [place];  Service: General;  Laterality: N/A;   FLEXIBLE SIGMOIDOSCOPY  02/25/2011   Procedure: FLEXIBLE SIGMOIDOSCOPY;  Surgeon: Inda Castle, MD;  Location: [facility];  Service: Endoscopy;  Laterality: N/A;   FOOT SURGERY     bilat, heel spurs- screw in R foot    HEMORRHOID SURGERY  10/08/2011   Procedure: HEMORRHOIDECTOMY;  Surgeon: Stark Klein, MD;  Location: [place];  Service: General;  Laterality: N/A;  External    ROTATOR CUFF REPAIR Right    SPHINCTEROTOMY  10/08/2011   Procedure: Joan Mayans;  Surgeon: Stark Klein, MD;  Location: [place];  Service: General;  Laterality: N/A;    Current Medications: Current Meds  Medication Sig   Accu-Chek Softclix Lancets lancets Use as instructed   acetaminophen (TYLENOL) 325 MG tablet Take 650 mg by mouth every 6 (six) hours as needed for moderate pain.   albuterol (VENTOLIN HFA) 108 (90 Base) MCG/ACT inhaler Inhale 1 puff into the lungs every 6 (six) hours as needed.   ALPRAZolam (XANAX) 0.25 MG tablet TAKE ONE TABLET BY MOUTH three times daily AS NEEDED   atorvastatin (LIPITOR) 80 MG tablet TAKE ONE TABLET BY MOUTH EVERY EVENING   Continuous Blood Gluc Receiver (FREESTYLE LIBRE 14 DAY READER) DEVI 1 each by Does not apply route daily.   Continuous Blood Gluc Sensor (FREESTYLE LIBRE 14 DAY SENSOR) MISC USE 1  ONCE DAILY   cyclobenzaprine (FLEXERIL) 5 MG tablet TAKE ONE TABLET BY MOUTH EVERY EVENING (Patient taking differently: Take 5 mg by mouth at bedtime.)   dronedarone (MULTAQ) 400 MG tablet TAKE 1 TABLET BY MOUTH TWICE DAILY WITH A MEAL   ezetimibe (ZETIA) 10 MG tablet TAKE ONE TABLET BY MOUTH ONCE DAILY   hydrochlorothiazide (HYDRODIURIL) 25 MG tablet Take 1 tablet (25 mg total) by mouth daily.   HYDROcodone-acetaminophen (NORCO) 10-325 MG tablet Take 1 tablet by mouth every 6 (six) hours as needed for pain.   lactulose (CONSTULOSE) 10 GM/15ML solution TAKE 30 MILLILITERS BY MOUTH  THREE TIMES DAILY AS NEEDED FOR MILD CONSTIPATION   linaclotide (LINZESS) 145 MCG CAPS capsule Take 1 capsule (145 mcg total) by mouth daily before breakfast.   losartan (COZAAR) 100 MG  tablet Take 1 tablet (100 mg total) by mouth daily.   metFORMIN (GLUCOPHAGE-XR) 500 MG 24 hr tablet TAKE ONE TABLET BY MOUTH EVERY MORNING   Pirfenidone (ESBRIET) 801 MG TABS Take 1 tablet by mouth 3 (three) times daily.   Vitamin D, Ergocalciferol, (DRISDOL) 1.25 MG (50000 UNIT) CAPS capsule Take 1 capsule (50,000 Units total) by mouth every 7 (seven) days for 12 doses.   XARELTO 20 MG TABS tablet TAKE ONE TABLET BY MOUTH EVERY EVENING     Allergies:   Fish oil, Other, Penicillins, Pneumococcal vaccines, Cashew nut oil, Nitrofurantoin macrocrystal, Peanut oil, Aspirin, Bactrim [sulfamethoxazole-trimethoprim], Ciprofloxacin, Ibuprofen, Influenza vaccines, Ivp dye [  iodinated contrast media], Latex, Macrobid [nitrofurantoin monohydrate macrocrystals], and Shellfish allergy   Social History   Socioeconomic History   Marital status: Married    Spouse name: Not on file   Number of children: 4   Years of education: Not on file   Highest education level: Not on file  Occupational History   Occupation: retired Forensic psychologist: UNEMPLOYED  Tobacco Use   Smoking status: Never    Passive exposure: Never   Smokeless tobacco: Never  Vaping Use   Vaping Use: Never used  Substance and Sexual Activity   Alcohol use: No    Alcohol/week: 0.0 standard drinks   Drug use: No   Sexual activity: Never  Other Topics Concern   Not on file  Social History Narrative   Not on file   Social Determinants of Health   Financial Resource Strain: Not on file  Food Insecurity: Not on file  Transportation Needs: Not on file  Physical Activity: Not on file  Stress: Not on file  Social Connections: Not on file     Family History: The patient's family history includes Arthritis/Rheumatoid in her sister; Asthma in her sister; Breast cancer in her mother and sister; COPD in her sister; Colon cancer in her brother and brother; Emphysema in her sister; Heart attack in her mother; Heart disease in her mother;  Lung cancer in her sister. There is no history of Anesthesia problems, Esophageal cancer, Rectal cancer, or Stomach cancer.  ROS:   Please see the history of present illness.     All other systems reviewed and are negative.  EKGs/Labs/Other Studies Reviewed:    The following studies were reviewed today:  Echo 04/14/2021  1. Left ventricular ejection fraction, by estimation, is 55 to 60%. The  left ventricle has normal function. The left ventricle has no regional  wall motion abnormalities. Left ventricular diastolic parameters are  consistent with Grade I diastolic  dysfunction (impaired relaxation).   2. Right ventricular systolic function is normal. The right ventricular  size is normal. There is normal pulmonary artery systolic pressure.   3. The mitral valve is normal in structure. No evidence of mitral valve  regurgitation. No evidence of mitral stenosis.   4. The aortic valve is normal in structure. Aortic valve regurgitation is  not visualized. Aortic valve sclerosis/calcification is present, without  any evidence of aortic stenosis.   5. The inferior vena cava is normal in size with greater than 50%  respiratory variability, suggesting right atrial pressure of 3 mmHg.   Comparison(s): A prior study was performed on 10/19/2017. No significant  change from prior study.   EKG:  EKG is not ordered today.   Recent Labs: 04/14/2021: B Natriuretic Peptide 42.9; TSH 1.274 04/15/2021: Hemoglobin 10.6; Platelets 151 04/24/2021: ALT 24; BUN 9; Creatinine, Ser 0.88; Magnesium 2.1; Potassium 4.3; Sodium 140  Recent Lipid Panel    Component Value Date/Time   CHOL 130 04/14/2021 0600   CHOL 167 11/26/2017 0908   TRIG 72 04/14/2021 0600   HDL 60 04/14/2021 0600   HDL 61 11/26/2017 0908   CHOLHDL 2.2 04/14/2021 0600   VLDL 14 04/14/2021 0600   LDLCALC 56 04/14/2021 0600   LDLCALC 90 12/01/2019 1025   LDLDIRECT 152.8 03/30/2011 0859     Risk Assessment/Calculations:     CHA2DS2-VASc Score = 5   This indicates a 7.2% annual risk of stroke. The patient's score is based upon: CHF History: 0 HTN History: 1 Diabetes History: 1  Stroke History: 0 Vascular Disease History: 1 Age Score: 1 Gender Score: 1          Physical Exam:    VS:  BP 140/70    Pulse (!) 56    Ht 5\' 5"  (1.651 m)    Wt 165 lb 12.8 oz (75.2 kg)    SpO2 99%    BMI 27.59 kg/m     Wt Readings from Last 3 Encounters:  05/14/21 165 lb 12.8 oz (75.2 kg)  05/08/21 165 lb 3.2 oz (74.9 kg)  04/30/21 163 lb 3.2 oz (74 kg)     GEN:  Well nourished, well developed in no acute distress HEENT: Normal NECK: No JVD; No carotid bruits LYMPHATICS: No lymphadenopathy CARDIAC: RRR, no murmurs, rubs, gallops RESPIRATORY:  Clear to auscultation without rales, wheezing or rhonchi  ABDOMEN: Soft, non-tender, non-distended MUSCULOSKELETAL:  No edema; No deformity  SKIN: Warm and dry NEUROLOGIC:  Alert and oriented x 3 PSYCHIATRIC:  Normal affect   ASSESSMENT:    1. PAF (paroxysmal atrial fibrillation) ([place])   2. Primary hypertension   3. Hyperlipidemia LDL goal <70   4. Controlled type 2 diabetes mellitus without complication, without long-term current use of insulin (HCC)    PLAN:    In order of problems listed above:  PAF: On Multaq and Xarelto.  Maintaining normal sinus rhythm based on recent EKG.  Hypertension: Blood pressure controlled on current therapy  Hyperlipidemia: On Zetia  DM2: Managed by primary care provider           Medication Adjustments/Labs and Tests Ordered: Current medicines are reviewed at length with the patient today.  Concerns regarding medicines are outlined above.  No orders of the defined types were placed in this encounter.  No orders of the defined types were placed in this encounter.   Patient Instructions  Medication Instructions:  Your physician recommends that you continue on your current medications as directed. Please refer to the  Current Medication list given to you today.  *If you need a refill on your cardiac medications before your next appointment, please call your pharmacy*   Lab Work: NONE ordered at this time of appointment    Testing/Procedures: NONE ordered at this time of appointment    Follow-Up: At [facility], you and your health needs are our priority.  As part of our continuing mission to provide you with exceptional heart care, we have created designated Provider Care Teams.  These Care Teams include your primary Cardiologist (physician) and Advanced Practice Providers (APPs -  Physician Assistants and Nurse Practitioners) who all work together to provide you with the care you need, when you need it.  We recommend signing up for the patient portal called "MyChart".  Sign up information is provided on this After Visit Summary.  MyChart is used to connect with patients for Virtual Visits (Telemedicine).  Patients are able to view lab/test results, encounter notes, upcoming appointments, etc.  Non-urgent messages can be sent to your provider as well.   To learn more about what you can do with MyChart, go to [email or web address].    Your next appointment:   4-6 month(s)  The format for your next appointment:   In Person  Provider:   [name], MD     Other Instructions     Signed, [name], [place]  05/16/2021 4:17 PM    [place]

## 2021-05-16 ENCOUNTER — Encounter: Payer: Self-pay | Admitting: Physician Assistant

## 2021-05-21 ENCOUNTER — Telehealth: Payer: Self-pay | Admitting: Pulmonary Disease

## 2021-05-22 NOTE — Telephone Encounter (Signed)
Returned call to [facility]--clarification was needed due to name brand Rx being sent on 04/16/21 and then additional Rx for generic was sent on 05/12/21. Verified with Lynna, Rph that we are indeed switching pt from name brand-name to generic as that is what is now preferred by her insurance. She verbalized understanding and will proceed filling generic Rx. Nothing further needed at this time. ?

## 2021-05-28 ENCOUNTER — Encounter: Payer: HMO | Admitting: *Deleted

## 2021-05-28 ENCOUNTER — Other Ambulatory Visit: Payer: Self-pay

## 2021-05-28 DIAGNOSIS — Z006 Encounter for examination for normal comparison and control in clinical research program: Secondary | ICD-10-CM

## 2021-05-28 DIAGNOSIS — J84112 Idiopathic pulmonary fibrosis: Secondary | ICD-10-CM

## 2021-05-28 NOTE — Research (Signed)
Title: Chronic Fibrosing Interstitial Lung Disease with Progressive Phenotype Prospective Outcomes (ILD-PRO) Registry  ?  ?Protocol #: IPF-PRO-SUB, Clinical Trials # [id number], Sponsor: [employer] ?  ?Protocol Version Amendment 4 dated 12Sep2019  and confirmed current on  ?Consent Version for today?s visit date of  Is [place] IRB Approved Version 17 Feb 2018 Revised 17 Feb 2018 ?  ?Objectives:  ?Describe current approaches to diagnosis and treatment of chronic fibrosing ILDs with progressive phenotype  ?Describe the natural history of chronic fibrosing ILDs with progressive phenotype  ?Assess quality of life from self-administered participant reported questionnaires for each disease group  ?Describe participant interactions with the healthcare system, describe treatment practices across multiple institutions for each disease group  ?Collect biological samples linked to well characterized chronic fibrosing ILDs with progressive phenotype to identify disease biomarkers  ?Collect data and biological samples that will support future research studies.  ?                                          ?Key Inclusion Criteria: ?Willing and able to provide informed consent  ?Age ? 30 years  ?Diagnosis of a non-IPF ILD of any duration, including, but not limited to Idiopathic Non-Specific Interstitial Pneumonia (INSIP), Unclassifiable Idiopathic Interstitial Pneumonias (IIPs), Interstitial Pneumonia with Autoimmune Features (IPAF), Autoimmune ILDs such as Rheumatoid Arthritis (RA-ILD) and Systemic Sclerosis (SSC-ILD), Chronic Hypersensitivity Pneumonitis (HP), Sarcoidosis or Exposure-related ILDs such as asbestosis.  ?Chronic fibrosing ILD defined by reticular abnormality with traction bronchiectasis with or without honeycombing confirmed by chest HRCT scan and/or lung biopsy.  ?Progressive phenotype as defined by fulfilling at least one of the criteria below of fibrotic changes (progression set point)  within the last 24 months regardless of treatment considered appropriate in individual ILDs:  ?decline in FVC % predicted (% pred) based on >10% relative decline  ?decline in FVC % pred based on ? 5 - <10% relative decline in FVC combined with worsening of respiratory symptoms as assessed by the site investigator  ?decline in FVC % pred based on ? 5 - <10% relative decline in FVC combined with increasing extent of fibrotic changes on chest imaging (HRCT scan) as assessed by the site investigator  ?decline in DLCO % pred based on ? 10% relative decline  ?worsening of respiratory symptoms as well as increasing extent of fibrotic changes on chest imaging (HRCT scan) as assessed by the site investigator independent of FVC change.   ?  ?Key Exclusion Criteria: ?Malignancy, treated or untreated, other than skin or early stage prostate cancer, within the past 5 years  ?Currently listed for lung transplantation at the time of enrollment  ?Currently enrolled in a clinical trial at the time of enrollment in this registry   ?  ?  ?Clinical Research Coordinator / Research RN note : This visit for Miranda [name] Subject [id number] with DOB: [birth year] on 05/28/2021 for the above protocol is Visit/Encounter #9 and is for purpose of research.  ?  ?Subject expressed continued interest and consent in continuing as a study subject. Subject confirmed that there was no change in contact information (e.g. address, telephone, email). Subject thanked for participation in research and contribution to science.   ?  ? During this visit on 05/28/2021 , the subject completed the blood work and questionnaires per the above referenced protocol. Please refer to the subject's paper source binder for further details. ?  ?  Signed by ?Leda Gauze Roxy Filler ?Research Assistant ?[facility]  ?[place], [place] ?04:04 PM 05/28/2021 ?  ?

## 2021-06-04 ENCOUNTER — Telehealth: Payer: Self-pay | Admitting: Pharmacist

## 2021-06-04 DIAGNOSIS — E119 Type 2 diabetes mellitus without complications: Secondary | ICD-10-CM | POA: Diagnosis not present

## 2021-06-04 DIAGNOSIS — Z961 Presence of intraocular lens: Secondary | ICD-10-CM | POA: Diagnosis not present

## 2021-06-04 DIAGNOSIS — H40023 Open angle with borderline findings, high risk, bilateral: Secondary | ICD-10-CM | POA: Diagnosis not present

## 2021-06-04 NOTE — Chronic Care Management (AMB) (Signed)
? ? ?Chronic Care Management ?Pharmacy Assistant  ? ?Name: Miranda [name]  MRN: [id number] DOB: 21-Aug-1945 ? ?Reason for Encounter: Return Phone call per MP ?  ?Recent office visits:  ?None ? ?Recent consult visits:  ?05/28/21 Marietta Bing R (Pulmonology) - Patient presented for IPF and other concerns. No medication changes ? ?05/14/21 [name], [place] (Cardiology) - Patient presented for PAF and other concerns. No medication changes. ? ?05/09/21 Marcello Moores MD, [facility] - Patient presented for Hypertension and other concerns. No other visit details available. ? ?Hospital visits:  ?Medication Reconciliation was completed by comparing discharge summary, patient?s EMR and Pharmacy list, and upon discussion with patient. ?  ?Patient presented to [facility] on  04/14/21 due to Chest pain. Patient was present for 3 days. ?  ?New?Medications Started at [facility] Discharge:?? ?-started  ?none ?  ?Medication Changes at Hospital Discharge: ?-Changed  ?ALPRAZolam ?Medications Discontinued at Hospital Discharge: ?-Stopped  ?amLODipine 10 MG tablet (NORVASC) ?diclofenac sodium 1 % Gel (Voltaren) ?furosemide 20 MG tablet (LASIX) ?hydrochlorothiazide 25 MG tablet (HYDRODIURIL) ?losartan 100 MG tablet (COZAAR) ?polyethylene glycol powder 17 GM/SCOOP ?Medications that remain the same after Hospital Discharge:??  ?-All other medications will remain the same.   ?  ? ?Medications: ?Outpatient Encounter Medications as of 06/04/2021  ?Medication Sig  ? Accu-Chek Softclix Lancets lancets Use as instructed  ? acetaminophen (TYLENOL) 325 MG tablet Take 650 mg by mouth every 6 (six) hours as needed for moderate pain.  ? albuterol (VENTOLIN HFA) 108 (90 Base) MCG/ACT inhaler Inhale 1 puff into the lungs every 6 (six) hours as needed.  ? ALPRAZolam (XANAX) 0.25 MG tablet TAKE ONE TABLET BY MOUTH three times daily AS NEEDED  ? atorvastatin (LIPITOR) 80 MG tablet TAKE ONE TABLET BY MOUTH EVERY EVENING  ? Continuous Blood Gluc  Receiver (FREESTYLE LIBRE 14 DAY READER) DEVI 1 each by Does not apply route daily.  ? Continuous Blood Gluc Sensor (FREESTYLE LIBRE 14 DAY SENSOR) MISC USE 1  ONCE DAILY  ? cyclobenzaprine (FLEXERIL) 5 MG tablet TAKE ONE TABLET BY MOUTH EVERY EVENING (Patient taking differently: Take 5 mg by mouth at bedtime.)  ? dronedarone (MULTAQ) 400 MG tablet TAKE 1 TABLET BY MOUTH TWICE DAILY WITH A MEAL  ? ezetimibe (ZETIA) 10 MG tablet TAKE ONE TABLET BY MOUTH ONCE DAILY  ? hydrochlorothiazide (HYDRODIURIL) 25 MG tablet Take 1 tablet (25 mg total) by mouth daily.  ? HYDROcodone-acetaminophen (NORCO) 10-325 MG tablet Take 1 tablet by mouth every 6 (six) hours as needed for pain.  ? lactulose (CONSTULOSE) 10 GM/15ML solution TAKE 30 MILLILITERS BY MOUTH  THREE TIMES DAILY AS NEEDED FOR MILD CONSTIPATION  ? linaclotide (LINZESS) 145 MCG CAPS capsule Take 1 capsule (145 mcg total) by mouth daily before breakfast.  ? losartan (COZAAR) 100 MG tablet Take 1 tablet (100 mg total) by mouth daily.  ? metFORMIN (GLUCOPHAGE-XR) 500 MG 24 hr tablet TAKE ONE TABLET BY MOUTH EVERY MORNING  ? Pirfenidone (ESBRIET) 801 MG TABS Take 1 tablet by mouth 3 (three) times daily.  ? Vitamin D, Ergocalciferol, (DRISDOL) 1.25 MG (50000 UNIT) CAPS capsule Take 1 capsule (50,000 Units total) by mouth every 7 (seven) days for 12 doses.  ? XARELTO 20 MG TABS tablet TAKE ONE TABLET BY MOUTH EVERY EVENING  ? ?No facility-administered encounter medications on file as of 06/04/2021.  ?Notes: ?Call back to patient per MP to see what she was needing, patient advised she received a msg from her pharmacy that  her address was incorrect. [facility], they have the appropriate information and [name] advised, Patient aware. ? ?Care Gaps: ?Hepatitis C Screening - Overdue ?TDAP - Overdue ?Zoster Vaccine - Overdue ?Foot Exam - Overdue ?PNA Vaccine - Overdue ?BP- 140/70 ( 06/13/21) ?CCM- 6/23 ?AWV-Last 2021- office aware to  schedule as of 9/22 ?Lab Results  ?Component Value Date  ? HGBA1C 6.0 03/14/2021  ? ? ?Star Rating Drugs: ?Metformin (Glucophage XR) 500 mg - Last filled 02/17/2021 30 DS at [name] ?Losartan (Cozaar) 100 mg - Last filled 03/22/2020 30 DS at [name] ?Atorvastatin (Lipitor) 80 mg - Last filled 03/22/2020 30 DS at [name] ? ? ? ?Ned Clines CMA ?Clinical Pharmacist Assistant ?[phone] ? ?

## 2021-06-10 ENCOUNTER — Telehealth: Payer: Self-pay | Admitting: *Deleted

## 2021-06-10 MED ORDER — LACTULOSE 10 GM/15ML PO SOLN
ORAL | 1 refills | Status: DC
Start: 2021-06-10 — End: 2021-08-06

## 2021-06-10 NOTE — Progress Notes (Signed)
? ? ?Chronic Care Management ?Pharmacy Assistant  ? ?Name: Miranda [name]  MRN: [id number] DOB: 03-02-1946 ? ?Reason for Encounter: Disease State and Medication Review ?  ?Conditions to be addressed/monitored: ?DMII ? ?Recent office visits:  ?None ? ?Recent consult visits:  ?06/04/21 Miranda [name], OD (Optometry) - Patient presented for Glaucoma and other concerns. No medication changes. ? ?Hospital visits:  ?Medication Reconciliation was completed by comparing discharge summary, patient?s EMR and Pharmacy list, and upon discussion with patient. ?  ?Patient presented to [facility] on  04/14/21 due to Chest pain. Patient was present for 3 days. ?  ?New?Medications Started at [facility] Discharge:?? ?-started  ?none ?  ?Medication Changes at Hospital Discharge: ?-Changed  ?ALPRAZolam ?Medications Discontinued at Hospital Discharge: ?-Stopped  ?amLODipine 10 MG tablet (NORVASC) ?diclofenac sodium 1 % Gel (Voltaren) ?furosemide 20 MG tablet (LASIX) ?hydrochlorothiazide 25 MG tablet (HYDRODIURIL) ?losartan 100 MG tablet (COZAAR) ?polyethylene glycol powder 17 GM/SCOOP ?Medications that remain the same after Hospital Discharge:??  ?-All other medications will remain the same.   ? ?Medications: ?Outpatient Encounter Medications as of 06/04/2021  ?Medication Sig  ? Accu-Chek Softclix Lancets lancets Use as instructed  ? acetaminophen (TYLENOL) 325 MG tablet Take 650 mg by mouth every 6 (six) hours as needed for moderate pain.  ? albuterol (VENTOLIN HFA) 108 (90 Base) MCG/ACT inhaler Inhale 1 puff into the lungs every 6 (six) hours as needed.  ? ALPRAZolam (XANAX) 0.25 MG tablet TAKE ONE TABLET BY MOUTH three times daily AS NEEDED  ? atorvastatin (LIPITOR) 80 MG tablet TAKE ONE TABLET BY MOUTH EVERY EVENING  ? Continuous Blood Gluc Receiver (FREESTYLE LIBRE 14 DAY READER) DEVI 1 each by Does not apply route daily.  ? Continuous Blood Gluc Sensor (FREESTYLE LIBRE 14 DAY SENSOR) MISC USE 1  ONCE DAILY  ?  cyclobenzaprine (FLEXERIL) 5 MG tablet TAKE ONE TABLET BY MOUTH EVERY EVENING (Patient taking differently: Take 5 mg by mouth at bedtime.)  ? dronedarone (MULTAQ) 400 MG tablet TAKE 1 TABLET BY MOUTH TWICE DAILY WITH A MEAL  ? ezetimibe (ZETIA) 10 MG tablet TAKE ONE TABLET BY MOUTH ONCE DAILY  ? hydrochlorothiazide (HYDRODIURIL) 25 MG tablet Take 1 tablet (25 mg total) by mouth daily.  ? HYDROcodone-acetaminophen (NORCO) 10-325 MG tablet Take 1 tablet by mouth every 6 (six) hours as needed for pain.  ? lactulose (CONSTULOSE) 10 GM/15ML solution TAKE 30 MILLILITERS BY MOUTH  THREE TIMES DAILY AS NEEDED FOR MILD CONSTIPATION  ? linaclotide (LINZESS) 145 MCG CAPS capsule Take 1 capsule (145 mcg total) by mouth daily before breakfast.  ? losartan (COZAAR) 100 MG tablet Take 1 tablet (100 mg total) by mouth daily.  ? metFORMIN (GLUCOPHAGE-XR) 500 MG 24 hr tablet TAKE ONE TABLET BY MOUTH EVERY MORNING  ? Pirfenidone (ESBRIET) 801 MG TABS Take 1 tablet by mouth 3 (three) times daily.  ? Vitamin D, Ergocalciferol, (DRISDOL) 1.25 MG (50000 UNIT) CAPS capsule Take 1 capsule (50,000 Units total) by mouth every 7 (seven) days for 12 doses.  ? XARELTO 20 MG TABS tablet TAKE ONE TABLET BY MOUTH EVERY EVENING  ? ?No facility-administered encounter medications on file as of 06/04/2021.  ?Recent Relevant Labs: ?Lab Results  ?Component Value Date/Time  ? HGBA1C 6.0 03/14/2021 10:19 AM  ? HGBA1C 5.8 (A) 12/03/2020 03:05 PM  ? HGBA1C 5.8 (A) 06/20/2020 11:16 AM  ? HGBA1C 6.1 12/01/2019 10:25 AM  ? HGBA1C 6.0 02/02/2018 12:20 PM  ?  ?Kidney Function ?Lab Results  ?  Component Value Date/Time  ? CREATININE 0.88 04/24/2021 03:01 PM  ? CREATININE 1.01 (H) 04/17/2021 06:15 AM  ? CREATININE 0.93 12/01/2019 10:25 AM  ? GFR 64.67 04/24/2021 03:01 PM  ? GFRNONAA 58 (L) 04/17/2021 06:15 AM  ? GFRAA >60 11/04/2017 04:10 AM  ? ? ?Current antihyperglycemic regimen:  ?Metformin XR 500 mg 1 tablet before breakfast  ?What recent interventions/DTPs have  been made to improve glycemic control:  ?Patient reports no changes ?Have there been any recent hospitalizations or ED visits since last visit with CPP? None ?Patient denies hypoglycemic symptoms, including None ?Patient denies hyperglycemic symptoms, including none ?How often are you checking your blood sugar? in the morning before eating or drinking ?What are your blood sugars ranging?  ?Fasting: 77 on today ?During the week, how often does your blood glucose drop below 70?  Patient reports it has not for several weeks ? ?Adherence Review: ?Is the patient currently on a STATIN medication? Yes ?Is the patient currently on ACE/ARB medication? Yes ?Does the patient have >5 day gap between last estimated fill dates? No ? ?Notes:  ?Patient reports she has been having some UTI symptoms, advised I would pass that on to the office so that someone may reach out to her and that she would likely need to come in to be seen so she may leave a specimen, she was in agreement. ? ?Reviewed chart for medication changes ahead of medication coordination call. ? ?No OVs, Consults, or hospital visits since last care coordination call/Pharmacist visit.  ? ?No medication changes indicated ?BP Readings from Last 3 Encounters:  ?05/14/21 140/70  ?05/08/21 124/60  ?04/30/21 130/68  ?  ?Lab Results  ?Component Value Date  ? HGBA1C 6.0 03/14/2021  ?  ? ?Patient obtains medications through Adherence Packaging  30 Days  ? ?Last adherence delivery included:  ?Alprazolam (Xanax) 0.25 mg : Take 1 tab 3 times daily as needed ?Ezetimbe (Zetia) 10 mg : take one tab at breakfast ?Cyclobenzaprine (Flexiril)  5 mg : take one tab at evening meal ?Atorvastatin (Lipitor) 80 mg : take one tab at evening meal ?Xarelto 20 mg : take one tab at evening meal ?Linzess 145 mcg Take one capsule before breakfast ?Dronedarone (Multaq) 400 mg : take one tab at lunch and one at evening meal ?Hydrochlorothiazide 25 mg : take one tab at breakfast ?Losartan Potassium  (Cozaar) 100 mg :  take one tab at evening meal ?Freestyle [employer]  ? ? ?HYDROCODONE/ACETAMINOPHEN 10-325 T 04/17/2021 15 60 each [name], MD [facility]   ? ? ?Patient is due for next adherence delivery on: 06/19/21. ?Called patient and reviewed medications and coordinated delivery. ?Packs for 30 DS ? ?This delivery to include: ?Hydrochlorothiazide 25 mg : take one tab at breakfast ?Losartan Potassium (Cozaar) 100 mg :  take one tab at evening meal ?Cyclobenzaprine (Flexiril)  5 mg : take one tab at evening meal ?Atorvastatin (Lipitor) 80 mg : take one tab at evening meal ?Linzess 145 mcg Take one capsule before breakfast ?Xarelto 20 mg : take one tab at evening meal ?Dronedarone (Multaq) 400 mg : take one tab at lunch and one at evening meal ?Ezetimbe (Zetia) 10 mg : take one tab at breakfast ?[employer]  ?Alprazolam (Xanax) 0.25 mg : Take 1 tab 3 times daily as needed ? ?Coordinated fill for : ?Lactulose to be added to order per patient ? ?Patient declined the following: ?Metformin, patient reports she still has an abundance on hand ? ? ?  Confirmed delivery date of 06/19/21, advised patient that pharmacy will contact them the morning of delivery.  ? ?Care Gaps: ?Hepatitis C Screening - Overdue ?TDAP - Overdue ?Zoster Vaccine - Overdue ?Foot Exam - Overdue ?PNA Vaccine - Overdue ?BP- 140/70(05/14/21) ?CCM- 6/23 ?AWV-Last 2021- office aware to schedule as of 9/22 ?Lab Results  ?Component Value Date  ? HGBA1C 6.0 03/14/2021  ? ? ?Star Rating Drugs: ?Metformin (Glucophage XR) 500 mg - Last filled 02/17/2021 30 DS at [name] ?Losartan (Cozaar) 100 mg - Last filled 05/16/2021 30 DS at [name] ?Atorvastatin (Lipitor) 80 mg - Last filled 05/15/2021 30 DS at [name] ? ? ? ? ?Ned Clines CMA ?Clinical Pharmacist Assistant ?[phone] ? ?

## 2021-06-10 NOTE — Telephone Encounter (Signed)
-----   Message from [name], [facility] sent at 06/10/2021  1:10 PM EDT ----- ?Regarding: Lactulose refill ?Hi, ? ?Ms. Keniston is requesting a refill of lactulose to be sent to [name]. She only uses as needed but would like some on hand as it does help. ? ?Thanks! ?Maddie ? ?

## 2021-06-10 NOTE — Telephone Encounter (Signed)
Refill sent.

## 2021-06-11 ENCOUNTER — Other Ambulatory Visit: Payer: Self-pay | Admitting: Internal Medicine

## 2021-06-11 DIAGNOSIS — F419 Anxiety disorder, unspecified: Secondary | ICD-10-CM

## 2021-06-13 DIAGNOSIS — Z7984 Long term (current) use of oral hypoglycemic drugs: Secondary | ICD-10-CM | POA: Diagnosis not present

## 2021-06-13 DIAGNOSIS — E119 Type 2 diabetes mellitus without complications: Secondary | ICD-10-CM | POA: Diagnosis not present

## 2021-06-16 ENCOUNTER — Telehealth (INDEPENDENT_AMBULATORY_CARE_PROVIDER_SITE_OTHER): Payer: HMO | Admitting: Family Medicine

## 2021-06-16 ENCOUNTER — Encounter: Payer: Self-pay | Admitting: Family Medicine

## 2021-06-16 ENCOUNTER — Ambulatory Visit: Payer: HMO | Admitting: Internal Medicine

## 2021-06-16 ENCOUNTER — Other Ambulatory Visit (INDEPENDENT_AMBULATORY_CARE_PROVIDER_SITE_OTHER): Payer: HMO

## 2021-06-16 VITALS — Ht 65.0 in

## 2021-06-16 DIAGNOSIS — R300 Dysuria: Secondary | ICD-10-CM

## 2021-06-16 DIAGNOSIS — R3129 Other microscopic hematuria: Secondary | ICD-10-CM

## 2021-06-16 LAB — URINALYSIS, ROUTINE W REFLEX MICROSCOPIC
Bilirubin Urine: NEGATIVE
Ketones, ur: NEGATIVE
Leukocytes,Ua: NEGATIVE
Nitrite: NEGATIVE
Specific Gravity, Urine: 1.025 (ref 1.000–1.030)
Total Protein, Urine: NEGATIVE
Urine Glucose: NEGATIVE
Urobilinogen, UA: 0.2 (ref 0.0–1.0)
pH: 6 (ref 5.0–8.0)

## 2021-06-16 LAB — POCT URINALYSIS DIPSTICK
Bilirubin, UA: NEGATIVE
Blood, UA: POSITIVE
Glucose, UA: NEGATIVE
Ketones, UA: NEGATIVE
Leukocytes, UA: NEGATIVE
Nitrite, UA: NEGATIVE
Protein, UA: POSITIVE — AB
Spec Grav, UA: 1.025 (ref 1.010–1.025)
Urobilinogen, UA: 0.2 E.U./dL
pH, UA: 6 (ref 5.0–8.0)

## 2021-06-16 NOTE — Progress Notes (Signed)
Virtual Visit via Telephone Note ?I connected with [name] on 06/16/21 at  4:30 PM EDT by telephone and verified that I am speaking with the correct person using two identifiers. ?  ?I discussed the limitations, risks, security and privacy concerns of performing an evaluation and management service by telephone and the availability of in person appointments. I also discussed with the patient that there may be a patient responsible charge related to this service. The patient expressed understanding and agreed to proceed. ? ?Location patient: home ?Location provider: work office ?Participants present for the call: patient, provider ?Patient did not have a visit in the prior 7 days to address this/these issue(s). ? ?Chief Complaint  ?Patient presents with  ? Dysuria  ? ? ?History of Present Illness: ?MELESSA COWELL is a 76 y.o.female with hx of ILD, paroxysmal atrial fibrillation on chronic anticoagulation and on Multaq, urinary incontinence, vitamin D deficiency, chronic back pain c/o dysuria that started a week ago and already resolved after increasing fluid intake. ?She has some urinary frequency ascribed to increasing water intake. ?Dysuria  ?This is a new problem. The current episode started 1 to 4 weeks ago. The problem occurs intermittently. The problem has been resolved. The quality of the pain is described as burning. The pain is at a severity of 0/10. The patient is experiencing no pain. There is No history of pyelonephritis. Pertinent negatives include no chills, discharge, flank pain, frequency, hematuria, hesitancy, nausea, sweats, urgency or vomiting. She has tried increased fluids for the symptoms. The treatment provided significant relief.  ? ?Treated for UTI in 02/2021, does not remember name of abx. ?UCx on 03/14/21 grew mixed genital flora. ? ?No hx of nephrolithiasis. ?DM II: BS's "good." ? ?Observations/Objective: ?Patient sounds cheerful and well on the phone. ?I do not appreciate any  SOB. ?Speech and thought processing are grossly intact. ?Patient reported vitals:Ht '5\' 5"'$  (1.651 m)   BMI 27.59 kg/m?  ? ?Assessment and Plan: ?Orders of the defined types were placed in this encounter. ?- Urinalysis with Reflex Microscopic ?- Culture, Urine ? ?1. Dysuria ?Resolved. ?Urine dipstick done earlier today was positive for blood and protein, rest negative. ?Will follow Ucx. ?Continue adequate hydration. ?Instructed about warning signs. ? ?2. Microscopic hematuria ?Today urine dipstick positive for blood. ?Noted 2+ and 3+ on prior UA intermittently. ? ?On chronic anticoagulation, she has not had gross hematuria. ?Further recommendations according to microscopic, may need urologic evaluation. ? ?Follow Up Instructions: ? ?Return if symptoms worsen or fail to improve. ? ?I did not refer this patient for an OV in the next 24 hours for this/these issue(s). ? ?I discussed the assessment and treatment plan with the patient. The patient was provided an opportunity to ask questions and all were answered. The patient agreed with the plan and demonstrated an understanding of the instructions. ?  ?The patient was advised to call back or seek an in-person evaluation if the symptoms worsen or if the condition fails to improve as anticipated. ? ?I provided  8 minutes of non-face-to-face time during this encounter. ?Avianna Moynahan G. [name], MD ? ?[place]. ?[place] office. ? ?

## 2021-06-17 LAB — URINE CULTURE
MICRO NUMBER:: 13214390
Result:: NO GROWTH
SPECIMEN QUALITY:: ADEQUATE

## 2021-06-25 ENCOUNTER — Other Ambulatory Visit: Payer: Self-pay

## 2021-06-25 DIAGNOSIS — R319 Hematuria, unspecified: Secondary | ICD-10-CM

## 2021-07-05 ENCOUNTER — Other Ambulatory Visit: Payer: Self-pay | Admitting: Internal Medicine

## 2021-07-07 ENCOUNTER — Other Ambulatory Visit: Payer: Self-pay | Admitting: Gastroenterology

## 2021-07-08 ENCOUNTER — Other Ambulatory Visit: Payer: Self-pay | Admitting: Internal Medicine

## 2021-07-08 DIAGNOSIS — F419 Anxiety disorder, unspecified: Secondary | ICD-10-CM

## 2021-07-09 ENCOUNTER — Telehealth: Payer: Self-pay | Admitting: Pharmacist

## 2021-07-09 NOTE — Chronic Care Management (AMB) (Signed)
? ? ?Chronic Care Management ?Pharmacy Assistant  ? ?Name: Miranda [name]  MRN: [id number] DOB: [birth year] ? ?Reason for Encounter: Medication Review  ?  ?Conditions to be addressed/monitored: ?DMII ? ?Recent office visits:  ?06/16/21 [name] Betty G MD - Patient presented via video for Microscopic hematuria and other concerns. No medication changes. ? ?Recent consult visits:  ?None  ? ?Hospital visits:  ?Medication Reconciliation was completed by comparing discharge summary, patient?s EMR and Pharmacy list, and upon discussion with patient. ?  ?Patient presented to [facility] on  04/14/21 due to Chest pain. Patient was present for 3 days. ?  ?New?Medications Started at [facility] Discharge:?? ?-started  ?none ?  ?Medication Changes at Hospital Discharge: ?-Changed  ?ALPRAZolam ?Medications Discontinued at Hospital Discharge: ?-Stopped  ?amLODipine 10 MG tablet (NORVASC) ?diclofenac sodium 1 % Gel (Voltaren) ?furosemide 20 MG tablet (LASIX) ?hydrochlorothiazide 25 MG tablet (HYDRODIURIL) ?losartan 100 MG tablet (COZAAR) ?polyethylene glycol powder 17 GM/SCOOP ?Medications that remain the same after Hospital Discharge:??  ?-All other medications will remain the same.   ? ?Medications: ?Outpatient Encounter Medications as of 07/09/2021  ?Medication Sig  ? Accu-Chek Softclix Lancets lancets Use as instructed  ? acetaminophen (TYLENOL) 325 MG tablet Take 650 mg by mouth every 6 (six) hours as needed for moderate pain.  ? albuterol (VENTOLIN HFA) 108 (90 Base) MCG/ACT inhaler Inhale 1 puff into the lungs every 6 (six) hours as needed.  ? ALPRAZolam (XANAX) 0.25 MG tablet TAKE ONE TABLET BY MOUTH three times daily AS NEEDED  ? atorvastatin (LIPITOR) 80 MG tablet TAKE ONE TABLET BY MOUTH EVERY EVENING  ? Continuous Blood Gluc Receiver (FREESTYLE LIBRE 14 DAY READER) DEVI 1 each by Does not apply route daily.  ? Continuous Blood Gluc Sensor (FREESTYLE LIBRE 14 DAY SENSOR) MISC USE TO check blood sugar daily as  directed  ? cyclobenzaprine (FLEXERIL) 5 MG tablet TAKE ONE TABLET BY MOUTH EVERY EVENING (Patient taking differently: Take 5 mg by mouth at bedtime.)  ? dronedarone (MULTAQ) 400 MG tablet TAKE 1 TABLET BY MOUTH TWICE DAILY WITH A MEAL  ? ezetimibe (ZETIA) 10 MG tablet TAKE ONE TABLET BY MOUTH ONCE DAILY  ? hydrochlorothiazide (HYDRODIURIL) 25 MG tablet Take 1 tablet (25 mg total) by mouth daily.  ? HYDROcodone-acetaminophen (NORCO) 10-325 MG tablet Take 1 tablet by mouth every 6 (six) hours as needed for pain.  ? lactulose (CONSTULOSE) 10 GM/15ML solution TAKE 30 MILLILITERS BY MOUTH  THREE TIMES DAILY AS NEEDED FOR MILD CONSTIPATION  ? LINZESS 145 MCG CAPS capsule TAKE ONE CAPSULE BY MOUTH BEFORE BREAKFAST  ? losartan (COZAAR) 100 MG tablet Take 1 tablet (100 mg total) by mouth daily.  ? metFORMIN (GLUCOPHAGE-XR) 500 MG 24 hr tablet TAKE ONE TABLET BY MOUTH EVERY MORNING  ? Pirfenidone (ESBRIET) 801 MG TABS Take 1 tablet by mouth 3 (three) times daily.  ? Vitamin D, Ergocalciferol, (DRISDOL) 1.25 MG (50000 UNIT) CAPS capsule Take 1 capsule (50,000 Units total) by mouth every 7 (seven) days for 12 doses.  ? XARELTO 20 MG TABS tablet TAKE ONE TABLET BY MOUTH EVERY EVENING  ? ?No facility-administered encounter medications on file as of 07/09/2021.  ? ?Reviewed chart for medication changes ahead of medication coordination call. ? ?No OVs, Consults, or hospital visits since last care coordination call/Pharmacist visit.  ? ?No medication changes indicated . ? ?BP Readings from Last 3 Encounters:  ?05/14/21 140/70  ?05/08/21 124/60  ?04/30/21 130/68  ?  ?Lab Results  ?Component Value  Date  ? HGBA1C 6.0 03/14/2021  ? Recent Relevant Labs: ?Lab Results  ?Component Value Date/Time  ? HGBA1C 6.0 03/14/2021 10:19 AM  ? HGBA1C 5.8 (A) 12/03/2020 03:05 PM  ? HGBA1C 5.8 (A) 06/20/2020 11:16 AM  ? HGBA1C 6.1 12/01/2019 10:25 AM  ? HGBA1C 6.0 02/02/2018 12:20 PM  ?  ?Kidney Function ?Lab Results  ?Component Value Date/Time  ?  CREATININE 0.88 04/24/2021 03:01 PM  ? CREATININE 1.01 (H) 04/17/2021 06:15 AM  ? CREATININE 0.93 12/01/2019 10:25 AM  ? GFR 64.67 04/24/2021 03:01 PM  ? GFRNONAA 58 (L) 04/17/2021 06:15 AM  ? GFRAA >60 11/04/2017 04:10 AM  ? ? ?Current antihyperglycemic regimen:  ?Metformin XR 500 mg 1 tablet before breakfast ?What recent interventions/DTPs have been made to improve glycemic control:  ?Patient reports no changes ?Have there been any recent hospitalizations or ED visits since last visit with CPP? No ?Patient denies hypoglycemic symptoms, including None ?Patient denies hyperglycemic symptoms, including none ?How often are you checking your blood sugar? twice daily ?What are your blood sugars ranging?  ?200, 98,98 Fasting's  ?Patient reports on the day of the 200 she had been having too much sweet tea and is now drinking more water ?During the week, how often does your blood glucose drop below 70? Not recently ? ?Adherence Review: ?Is the patient currently on a STATIN medication? Yes ?Is the patient currently on ACE/ARB medication? Yes ?Does the patient have >5 day gap between last estimated fill dates? No ? ? ? ? ?Patient obtains medications through Adherence Packaging  30 Days  ? ?Last adherence delivery included: ?Hydrochlorothiazide 25 mg : take one tab at breakfast ?Losartan Potassium (Cozaar) 100 mg :  take one tab at evening meal ?Cyclobenzaprine (Flexiril)  5 mg : take one tab at evening meal ?Atorvastatin (Lipitor) 80 mg : take one tab at evening meal ?Linzess 145 mcg Take one capsule before breakfast ?Xarelto 20 mg : take one tab at evening meal ?Dronedarone (Multaq) 400 mg : take one tab at lunch and one at evening meal ?Ezetimbe (Zetia) 10 mg : take one tab at breakfast ?[employer]  ?Alprazolam (Xanax) 0.25 mg : Take 1 tab 3 times daily as needed ?  ?Coordinated fill for : ?Lactulose to be added to order per patient ?  ?Patient declined the following: ?Metformin, patient reports she still has  an abundance on hand ?  ? ?Patient is due for next adherence delivery on: 07/21/21. ?Called patient and reviewed medications and coordinated delivery. ?Packaging for 30 DS ? ?This delivery to include: ?Hydrochlorothiazide 25 mg : take one tab at breakfast ?Losartan Potassium (Cozaar) 100 mg :  take one tab at evening meal ?Cyclobenzaprine (Flexiril)  5 mg : take one tab at evening meal ?Atorvastatin (Lipitor) 80 mg : take one tab at evening meal ?Linzess 145 mcg Take one capsule before breakfast ?Xarelto 20 mg : take one tab at evening meal ?Dronedarone (Multaq) 400 mg : take one tab at lunch and one at evening meal ?Ezetimbe (Zetia) 10 mg : take one tab at breakfast ?[employer]  ?Alprazolam (Xanax) 0.25 mg : Take 1 tab 3 times daily as needed ?Lactulose 10 gm : Take 30 ml's 3 times daily as needed ?  ? ?Metformin, patient reports she still has an abundance on hand ? ? ?Confirmed delivery date of 07/21/21, advised patient that pharmacy will contact them the morning of delivery.  ? ?Care Gaps: ?Hepatitis C Screening - Overdue ?TDAP -  Overdue ?Zoster Vaccine - Overdue ?Foot Exam - Overdue ?PNA Vaccine - Overdue ?BP- 140/70(05/14/21) ?CCM- 6/23 ?AWV-Last 2021- office aware to schedule as of 9/22 ? ?Star Rating Drugs: ?Metformin (Glucophage XR) 500 mg - Last filled 02/17/2021 30 DS at [name] (Pt has been declining do to on hand supply) ?Losartan (Cozaar) 100 mg - Last filled 06/17/2021 30 DS at [name] ?Atorvastatin (Lipitor) 80 mg - Last filled 06/17/2021 30 DS at [name] ?  ? ? ? ?Ned Clines CMA ?Clinical Pharmacist Assistant ?[phone] ? ?

## 2021-07-10 ENCOUNTER — Telehealth: Payer: Self-pay

## 2021-07-10 NOTE — Telephone Encounter (Signed)
PA renewal initiated automatically by CoverMyMeds. ? ?Submitted a Prior Authorization request to  [employer]  for PIRFENIDONE via CoverMyMeds.  ? ?Request cancelled due to duplication of an already approved request. ? ?

## 2021-07-18 ENCOUNTER — Other Ambulatory Visit: Payer: Self-pay | Admitting: Pulmonary Disease

## 2021-07-18 DIAGNOSIS — J84112 Idiopathic pulmonary fibrosis: Secondary | ICD-10-CM

## 2021-07-18 NOTE — Telephone Encounter (Signed)
Refill sent for PIRFENIDONE to [facility] (pulmonary fibrosis team): [phone] ? ?Dose: 801 mg three times daily ? ?Last OV: Research visit 05/28/21, OV on 04/30/21 ?Provider: Dr. Vaughan Browner ? ?Next OV: 6 months (August 2023 but not yet scheduled) ? ?Miranda [name], PharmD, MPH, BCPS ?Clinical Pharmacist (Rheumatology and Pulmonology)  ?

## 2021-08-03 IMAGING — US US BREAST*R* LIMITED INC AXILLA
1 series · 4 of 4 positions shown · non-contrast
Comparison: Previous exam(s).

CLINICAL DATA: 74-year-old female with palpable thickening in the
UPPER OUTER RIGHT breast discovered on clinical exam. Also for
annual bilateral mammogram.

EXAM:
DIGITAL DIAGNOSTIC BILATERAL MAMMOGRAM WITH CAD AND TOMOSYNTHESIS
ULTRASOUND RIGHT BREAST
TECHNIQUE: Bilateral digital diagnostic mammography and breast tomosynthesis
was performed. Digital images of the breasts were evaluated with
computer-aided detection. Targeted ultrasound examination of the
Right breast was performed.

[Series 1: us breast*right* limited inc axilla · 0.06mm/px · 4 of 4 slices shown]
[im 1/4]
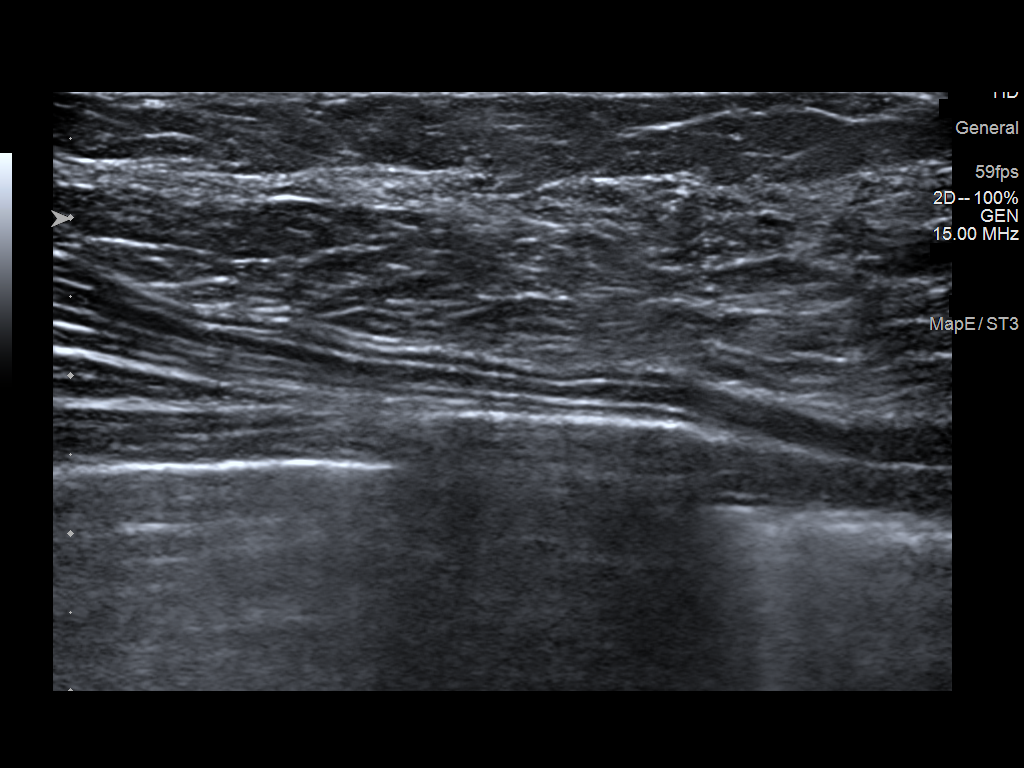
[im 2/4]
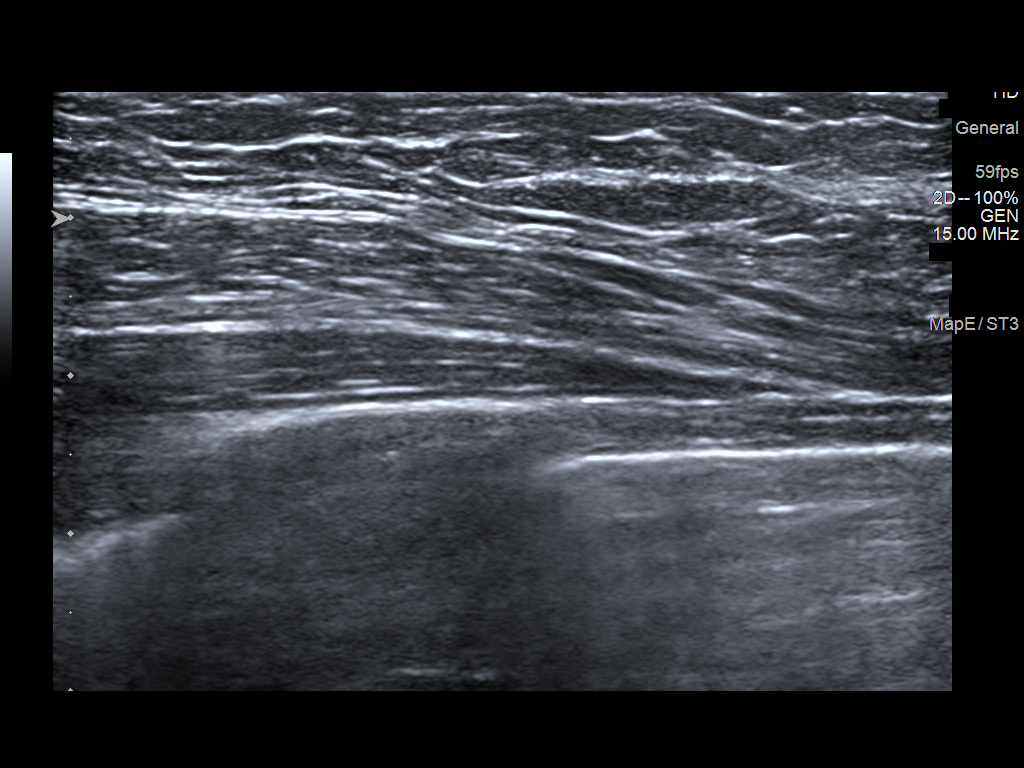
[im 3/4]
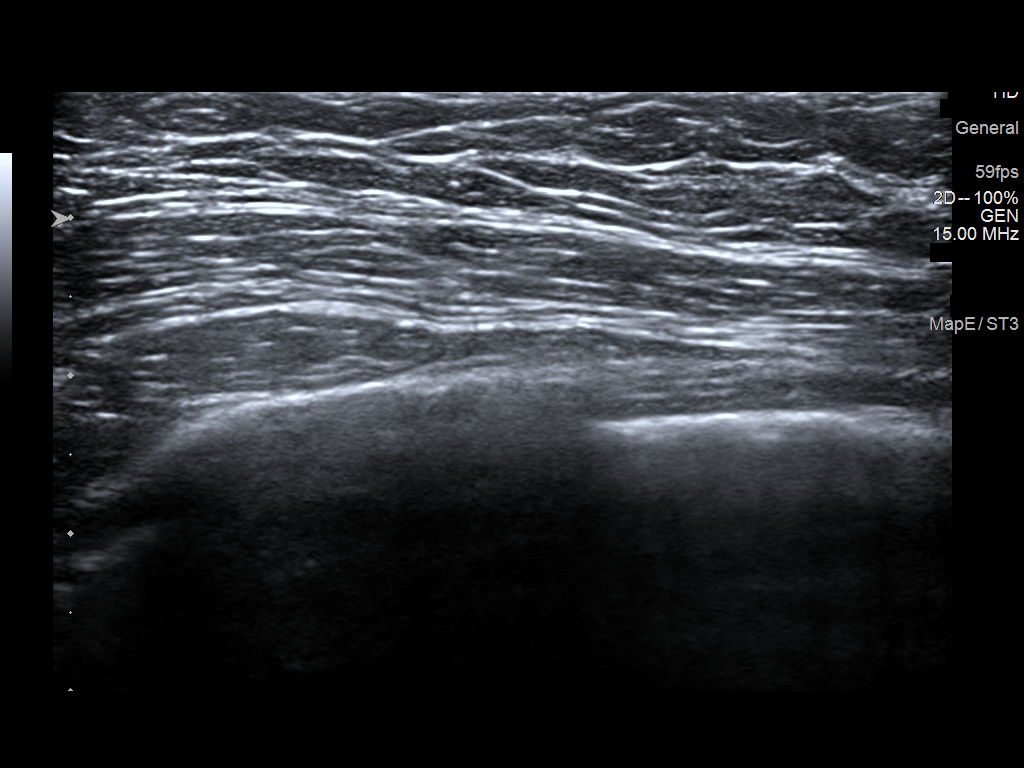
[im 4/4]
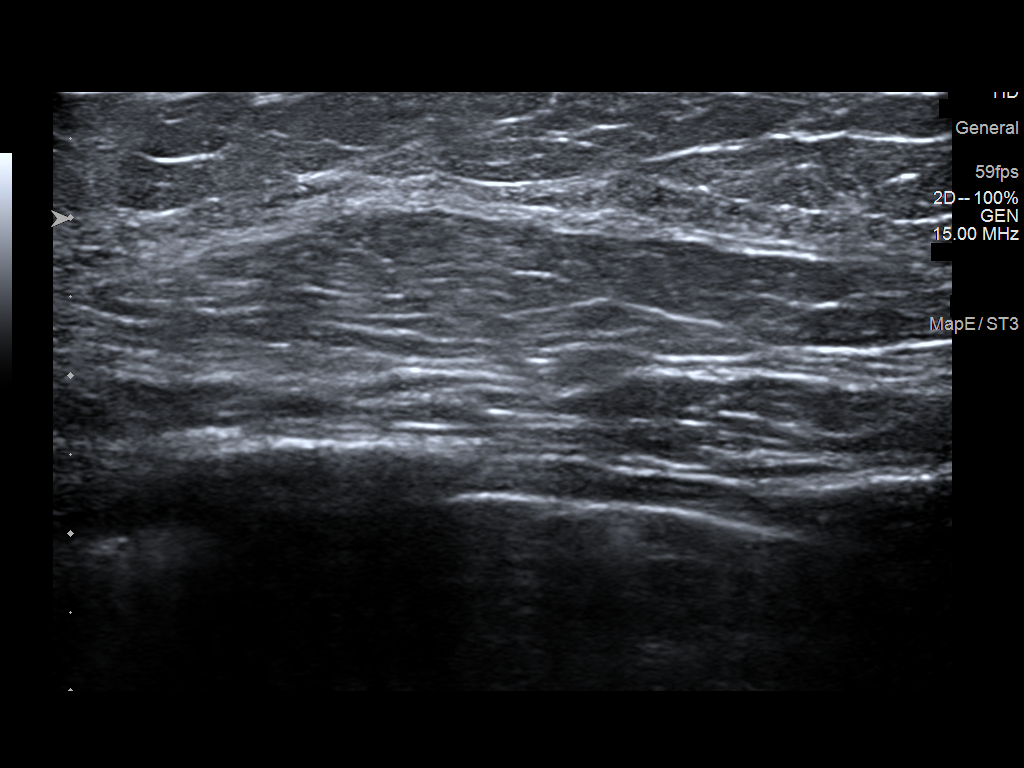

[4 of 4 positions shown; findings below may reference images not displayed]

ACR Breast Density Category b: There are scattered areas of
fibroglandular density.
FINDINGS: 2D/3D full field views of both breasts and spot compression view of
the RIGHT breast demonstrate no suspicious mass, distortion or
worrisome calcifications within either breast.

On physical exam, mild thickening in the UPPER OUTER RIGHT breast
identified without discrete palpable mass.

Targeted ultrasound is performed, showing no sonographic abnormality
within the UPPER-OUTER RIGHT breast
IMPRESSION: 1. No mammographic, suspicious palpable or sonographic abnormality
within the UPPER-OUTER RIGHT breast, in the area of clinical
concern.
2. No mammographic evidence of breast malignancy.

RECOMMENDATION:
Bilateral screening mammogram in 1 year.

I have discussed the findings and recommendations with the patient.
If applicable, a reminder letter will be sent to the patient
regarding the next appointment.

BI-RADS CATEGORY  1: Negative.

## 2021-08-06 ENCOUNTER — Other Ambulatory Visit: Payer: Self-pay | Admitting: Cardiology

## 2021-08-06 ENCOUNTER — Other Ambulatory Visit: Payer: Self-pay | Admitting: Internal Medicine

## 2021-08-06 DIAGNOSIS — I480 Paroxysmal atrial fibrillation: Secondary | ICD-10-CM

## 2021-08-06 DIAGNOSIS — F419 Anxiety disorder, unspecified: Secondary | ICD-10-CM

## 2021-08-08 ENCOUNTER — Telehealth: Payer: Self-pay | Admitting: Pharmacist

## 2021-08-08 NOTE — Chronic Care Management (AMB) (Cosign Needed)
Chronic Care Management Pharmacy Assistant   Name: Miranda [name]  MRN: [id number] DOB: 27-Sep-1945  Reason for Encounter: Medication Review Medication coordination   Recent office visits:  None  Recent consult visits:  None   Hospital visits:  Medication Reconciliation was completed by comparing discharge summary, patient's EMR and Pharmacy list, and upon discussion with patient.   Patient presented to [facility] on  04/14/21 due to Chest pain. Patient was present for 3 days.   New?Medications Started at [facility] Discharge:?? -started  none   Medication Changes at Hospital Discharge: -Changed  ALPRAZolam Medications Discontinued at Hospital Discharge: -Stopped  amLODipine 10 MG tablet (NORVASC) diclofenac sodium 1 % Gel (Voltaren) furosemide 20 MG tablet (LASIX) hydrochlorothiazide 25 MG tablet (HYDRODIURIL) losartan 100 MG tablet (COZAAR) polyethylene glycol powder 17 GM/SCOOP Medications that remain the same after Hospital Discharge:??  -All other medications will remain the same.      Medications: Outpatient Encounter Medications as of 08/08/2021  Medication Sig   Accu-Chek Softclix Lancets lancets Use as instructed   acetaminophen (TYLENOL) 325 MG tablet Take 650 mg by mouth every 6 (six) hours as needed for moderate pain.   albuterol (VENTOLIN HFA) 108 (90 Base) MCG/ACT inhaler Inhale 1 puff into the lungs every 6 (six) hours as needed.   ALPRAZolam (XANAX) 0.25 MG tablet TAKE ONE TABLET BY MOUTH three times daily AS NEEDED   atorvastatin (LIPITOR) 80 MG tablet TAKE ONE TABLET BY MOUTH EVERY EVENING   Continuous Blood Gluc Receiver (FREESTYLE LIBRE 14 DAY READER) DEVI 1 each by Does not apply route daily.   Continuous Blood Gluc Sensor (FREESTYLE LIBRE 14 DAY SENSOR) MISC USE TO check blood sugar daily as directed   cyclobenzaprine (FLEXERIL) 5 MG tablet TAKE ONE TABLET BY MOUTH EVERY EVENING (Patient taking differently: Take 5 mg by mouth at  bedtime.)   dronedarone (MULTAQ) 400 MG tablet TAKE ONE TABLET BY MOUTH AT NOON and TAKE ONE TABLET BY MOUTH EVERY EVENING   ezetimibe (ZETIA) 10 MG tablet TAKE ONE TABLET BY MOUTH ONCE DAILY   hydrochlorothiazide (HYDRODIURIL) 25 MG tablet Take 1 tablet (25 mg total) by mouth daily.   HYDROcodone-acetaminophen (NORCO) 10-325 MG tablet Take 1 tablet by mouth every 6 (six) hours as needed for pain.   lactulose, encephalopathy, (ENULOSE) 10 GM/15ML SOLN take 48ms BY MOUTH three times daily AS NEEDED FOR mild constipation   LINZESS 145 MCG CAPS capsule TAKE ONE CAPSULE BY MOUTH BEFORE BREAKFAST   losartan (COZAAR) 100 MG tablet Take 1 tablet (100 mg total) by mouth daily.   metFORMIN (GLUCOPHAGE-XR) 500 MG 24 hr tablet TAKE ONE TABLET BY MOUTH EVERY MORNING   Pirfenidone 801 MG TABS TAKE 1 TABLET BY MOUTH 3 TIMES A DAY.   XARELTO 20 MG TABS tablet TAKE ONE TABLET BY MOUTH EVERY EVENING   No facility-administered encounter medications on file as of 08/08/2021.  Reviewed chart for medication changes ahead of medication coordination call.  No OVs, Consults, or hospital visits since last care coordination call/Pharmacist visit.  No medication changes indicated .  BP Readings from Last 3 Encounters:  05/14/21 140/70  05/08/21 124/60  04/30/21 130/68    Lab Results  Component Value Date   HGBA1C 6.0 03/14/2021     Patient obtains medications through Adherence Packaging  30 Days   Last adherence delivery included: (medication name and frequency) Hydrochlorothiazide 25 mg : take one tab at breakfast Losartan Potassium (Cozaar) 100 mg :  take one  tab at evening meal Cyclobenzaprine (Flexiril)  5 mg : take one tab at evening meal Atorvastatin (Lipitor) 80 mg : take one tab at evening meal Linzess 145 mcg Take one capsule before breakfast Xarelto 20 mg : take one tab at evening meal Dronedarone (Multaq) 400 mg : take one tab at lunch and one at evening meal Ezetimbe (Zetia) 10 mg : take one  tab at breakfast Freestyle Libre Sensors  Alprazolam (Xanax) 0.25 mg : Take 1 tab 3 times daily as needed Lactulose 10 gm : Take 30 ml's 3 times daily as needed     Metformin, patient reports she still has an abundance on hand   Patient is due for next adherence delivery on: 08/19/21. Called patient and reviewed medications and coordinated delivery. Packs 30 DS  This delivery to include: Hydrochlorothiazide 25 mg : take one tab at breakfast Losartan Potassium (Cozaar) 100 mg :  take one tab at evening meal Cyclobenzaprine (Flexiril)  5 mg : take one tab at evening meal Atorvastatin (Lipitor) 80 mg : take one tab at evening meal Linzess 145 mcg Take one capsule before breakfast Xarelto 20 mg : take one tab at evening meal Dronedarone (Multaq) 400 mg : take one tab at lunch and one at evening meal Ezetimbe (Zetia) 10 mg : take one tab at breakfast Freestyle Libre Sensors  Alprazolam (Xanax) 0.25 mg : Take 1 tab 3 times daily as needed Lactulose 10 gm : Take 30 ml's 3 times daily as needed    Patient declined the following medications (meds) due to (reason) Lactulose 10 gm : Take 30 ml's 3 times daily as needed Metformin, patient reports she still has an abundance on hand    Confirmed delivery date of 08/19/21, advised patient that pharmacy will contact them the morning of delivery.   Care Gaps: Hepatitis C Screening - Overdue TDAP - Overdue Zoster Vaccine - Overdue Foot Exam - Overdue PNA Vaccine - Overdue Eye Exam - Postponed COVID Booster - Postponed BP- 140/70 ( 05/14/21) CCM- 6/23 AWV- Last 2021- office aware to schedule as of 9/22   Lab Results  Component Value Date   HGBA1C 6.0 03/14/2021    Star Rating Drugs: Metformin (Glucophage XR) 500 mg - Last filled 02/17/2021 30 DS at [name] (Pt has been declining do to on hand supply) Losartan (Cozaar) 100 mg - Last filled 07/15/2021 30 DS at [name] Atorvastatin (Lipitor) 80 mg - Last filled 07/15/2021 30 DS at  [facility] Pharmacist Assistant [phone]

## 2021-08-12 ENCOUNTER — Other Ambulatory Visit: Payer: Self-pay | Admitting: Internal Medicine

## 2021-08-12 DIAGNOSIS — F419 Anxiety disorder, unspecified: Secondary | ICD-10-CM

## 2021-08-12 NOTE — Telephone Encounter (Signed)
The last refill was "printed".

## 2021-08-18 ENCOUNTER — Telehealth: Payer: Self-pay | Admitting: Pharmacist

## 2021-08-18 NOTE — Chronic Care Management (AMB) (Signed)
    Chronic Care Management Pharmacy Assistant   Name: Miranda [name]  MRN: [id number] DOB: 17-Dec-1945  08/19/21 APPOINTMENT REMINDER   Called Patient No answer, left message of appointment on 08/19/21 at 2:30 via telephone visit with [name], Pharm D.   Notified to have all medications, supplements, blood pressure and/or blood sugar logs available during appointment and to return call if need to reschedule.     Care Gaps: Hepatitis C Screening - Overdue TDAP - Overdue Zoster Vaccine - Overdue Foot Exam - Overdue PNA Vaccine - Overdue Eye Exam - Postponed COVID Booster - Postponed BP- 140/70 ( 05/14/21) AWV- Last 2021- office aware to schedule as of 9/22 Lab Results  Component Value Date   HGBA1C 6.0 03/14/2021    Star Rating Drug: Metformin (Glucophage XR) 500 mg - Last filled 02/17/2021 30 DS at [name] (Pt has been declining do to on hand supply) Losartan (Cozaar) 100 mg - Last filled 07/15/2021 30 DS at [name] Atorvastatin (Lipitor) 80 mg - Last filled 07/15/2021 30 DS at [name]     Medications: Outpatient Encounter Medications as of 08/18/2021  Medication Sig   Accu-Chek Softclix Lancets lancets Use as instructed   acetaminophen (TYLENOL) 325 MG tablet Take 650 mg by mouth every 6 (six) hours as needed for moderate pain.   albuterol (VENTOLIN HFA) 108 (90 Base) MCG/ACT inhaler Inhale 1 puff into the lungs every 6 (six) hours as needed.   ALPRAZolam (XANAX) 0.25 MG tablet TAKE ONE TABLET BY MOUTH three times daily AS NEEDED   atorvastatin (LIPITOR) 80 MG tablet TAKE ONE TABLET BY MOUTH EVERY EVENING   Continuous Blood Gluc Receiver (FREESTYLE LIBRE 14 DAY READER) DEVI 1 each by Does not apply route daily.   Continuous Blood Gluc Sensor (FREESTYLE LIBRE 14 DAY SENSOR) MISC USE TO check blood sugar daily as directed   cyclobenzaprine (FLEXERIL) 5 MG tablet TAKE ONE TABLET BY MOUTH EVERY EVENING (Patient taking differently: Take 5 mg by mouth at bedtime.)    dronedarone (MULTAQ) 400 MG tablet TAKE ONE TABLET BY MOUTH AT NOON and TAKE ONE TABLET BY MOUTH EVERY EVENING   ezetimibe (ZETIA) 10 MG tablet TAKE ONE TABLET BY MOUTH ONCE DAILY   hydrochlorothiazide (HYDRODIURIL) 25 MG tablet Take 1 tablet (25 mg total) by mouth daily.   HYDROcodone-acetaminophen (NORCO) 10-325 MG tablet Take 1 tablet by mouth every 6 (six) hours as needed for pain.   lactulose, encephalopathy, (ENULOSE) 10 GM/15ML SOLN take 66ms BY MOUTH three times daily AS NEEDED FOR mild constipation   LINZESS 145 MCG CAPS capsule TAKE ONE CAPSULE BY MOUTH BEFORE BREAKFAST   losartan (COZAAR) 100 MG tablet Take 1 tablet (100 mg total) by mouth daily.   metFORMIN (GLUCOPHAGE-XR) 500 MG 24 hr tablet TAKE ONE TABLET BY MOUTH EVERY MORNING   Pirfenidone 801 MG TABS TAKE 1 TABLET BY MOUTH 3 TIMES A DAY.   XARELTO 20 MG TABS tablet TAKE ONE TABLET BY MOUTH EVERY EVENING   No facility-administered encounter medications on file as of 08/18/2021.      LRabunClinical Pharmacist Assistant [phone]

## 2021-08-19 ENCOUNTER — Telehealth: Payer: Self-pay | Admitting: Pharmacist

## 2021-08-19 ENCOUNTER — Ambulatory Visit (INDEPENDENT_AMBULATORY_CARE_PROVIDER_SITE_OTHER): Payer: HMO | Admitting: Pharmacist

## 2021-08-19 DIAGNOSIS — I10 Essential (primary) hypertension: Secondary | ICD-10-CM

## 2021-08-19 DIAGNOSIS — E1169 Type 2 diabetes mellitus with other specified complication: Secondary | ICD-10-CM

## 2021-08-19 NOTE — Patient Instructions (Addendum)
Hi Miranda [name],  It was great to get to talk with you again!  Please reach out to me if you have any questions or need anything!  Best, Maddie  [name], PharmD, [facility] [place]  Visit Information   Goals Addressed   None    Patient Care Plan: CCM Pharmacy Care Plan     Problem Identified: Problem: Hypertension, Hyperlipidemia, Diabetes, Atrial Fibrillation, Asthma, Anxiety and fluid retention, back pain, constipation, IPF      Long-Range Goal: Patient-Specific Goal   Start Date: 06/10/2020  Expected End Date: 05/13/2021  Recent Progress: On track  Priority: High  Note:   Current Barriers:  Unable to independently monitor therapeutic efficacy Unable to self administer medications as prescribed  Pharmacist Clinical Goal(s):  Patient will achieve adherence to monitoring guidelines and medication adherence to achieve therapeutic efficacy achieve ability to self administer medications as prescribed through use of adherence packaging as evidenced by patient report through collaboration with PharmD and provider.   Interventions: 1:1 collaboration with [name], [name], MD regarding development and update of comprehensive plan of care as evidenced by provider attestation and co-signature Inter-disciplinary care team collaboration (see longitudinal plan of care) Comprehensive medication review performed; medication list updated in electronic medical record  Hypertension (BP goal <140/90) -Controlled -Current treatment: HCTZ 25 mg 1 tablet daily - Appropriate, Query effective, Safe, Accessible Losartan 100 mg 1 tablet daily - Appropriate, Query effective, Safe, Accessible -Medications previously tried: benazepril, metoprolol, HCTZ, losartan, amlodipine  -Current home readings: 124/63 HR 58, 151/71 HR 75, 148/75 HR 71, 125/58 HR 54, patient just bought a BP cuff and plans to start using -Current dietary  habits: patient reports working on it and has cut out salt and pork; patient tries to eat a lot more fish and vegetables -Current exercise habits: patient reports she walks most days; recommended goal of 150 min/week  -Denies hypotensive/hypertensive symptoms -Educated on Daily salt intake goal < 2300 mg; Exercise goal of 150 minutes per week; Importance of home blood pressure monitoring; -Counseled to monitor BP at home at least weekly, document, and provide log at future appointments -Counseled on diet and exercise extensively Recommended to continue current medication  Hyperlipidemia: (LDL goal < 55) -Not ideally controlled -Current treatment: Atorvastatin '80mg'$ , 1 tablet once daily - Appropriate, Query effective, Safe, Accessible Ezetimibe 10 mg, 1 tablet once daily - Appropriate, Query effective, Safe, Accessible -Medications previously tried: lovastatin  -Current dietary patterns: patient tries to eat a lot more fish and vegetables -Current exercise habits:  patient reports she walks most days; recommended goal of 150 min/week  -Educated on Cholesterol goals;  Benefits of statin for ASCVD risk reduction; Importance of limiting foods high in cholesterol; Exercise goal of 150 minutes per week; -Counseled on diet and exercise extensively Recommended to continue current medication  Diabetes (A1c goal <7%) -Controlled -Current medications: Metformin XR 500 mg 1 tablet before breakfast - Appropriate, Effective, Query Safe, Accessible -Medications previously tried: none  -Current home glucose readings: 60s more than usual; 80s having lows in the morning (9% of BG readings were < 70).  fasting glucose: n/a post prandial glucose: n/a -Denies hypoglycemic/hyperglycemic symptoms -Current meal patterns:  breakfast: n/a  lunch: n/a  dinner: n/a snacks: n/a drinks: n/a -Current exercise: walking daily -Educated on Exercise goal of 150 minutes per week; Prevention and management of  hypoglycemic episodes; Continuous glucose monitoring; Carbohydrate counting and/or plate method -Counseled to check feet daily and get yearly eye exams -  Collaborated with PCP about trial off of metformin to see if the low BGs stop  Atrial Fibrillation (Goal: prevent stroke and major bleeding) -Controlled -CHADSVASC: 5 -Current treatment: Rate control: Multaq 400 mg 1 tablet twice daily with a meal - Appropriate, Effective, Safe, Accessible Anticoagulation: Xarelto 20 mg 1 tablet daily with dinner - Appropriate, Effective, Safe, Accessible -Medications previously tried: none -Home BP and HR readings: did not provide any -Counseled on  the importance of taking both medications with a meal -Recommended to continue current medication Patient has resumed Xarelto since surgery.  Asthma/IPF (Goal: control symptoms and prevent exacerbations) -Controlled -Current treatment  Albuterol HFA 146mg, act inhaler, inhale 1 puff every six hours as needed  - Appropriate, Effective, Safe, Accessible Pirfenidone '801mg'$ , 1 tablet in the morning, at noon, and at bedtime  - Appropriate, Effective, Safe, Accessible -Medications previously tried: none  -Patient denies consistent use of maintenance inhaler -Frequency of rescue inhaler use: as needed -Counseled on Proper inhaler technique; When to use rescue inhaler -Recommended to continue current medication   Fluid retention (Goal: minimize swelling) -Controlled -Current treatment  None -Medications previously tried: furosemide  -Recommended to continue current medication Counseled on limiting salt intake to prevent fluid retention  Back pain (Goal: minimize symptoms of pain) -Controlled -Current treatment  Hydrocodone/APAP 10/'325mg'$ , 1 tablet every six hours as needed (takes as indicated) - Appropriate, Effective, Safe, Accessible Cyclobenzaprine '5mg'$ , 1 tablet at bedtime  - Appropriate, Effective, Safe, Accessible Diclofenac 1% gel, apply 2 g four  times daily  - Appropriate, Effective, Safe, Accessible APAP 500 mg, 1 tablet as needed for pain - Appropriate, Effective, Safe, Accessible -Medications previously tried: meloxicam, naproxen, oxycodone  -Recommended to continue current medication  Anxiety (Goal: minimize symptoms) -Not ideally controlled -Current treatment: Alprazolam 0.'25mg'$ ,  1 tablet three times daily as needed for anxiety - Appropriate, Effective, Query Safe, Accessible -Medications previously tried/failed: none -GAD7: n/a -Educated on Benefits of medication for symptom control -Recommended to continue current medication  Constipation (Goal: normal bowel movements) -Controlled -Current treatment  docusate '100mg'$ , 1 capsule twice daily - Appropriate, Effective, Safe, Accessible Lactulose solution, take 30 ML three times daily as needed for mild constipation - not taking right now Miralax, 17g one daily (not a part of daily regimen) Linzess 145 mcg daily - Appropriate, Effective, Safe, Query accessible -Medications previously tried: bisacodyl, Linzess (previous loose stools)  -Recommended to continue current medication Assessed patient finances. Provided PAP application for Linzess in office.   Health Maintenance -Vaccine gaps: shingles, tetanus -Current therapy:  Biotin 5000 mcg 1 capsule daily -Educated on Cost vs benefit of each product must be carefully weighed by individual consumer -Patient is satisfied with current therapy and denies issues -Recommended to continue current medication  Patient Goals/Self-Care Activities Patient will:  - take medications as prescribed check blood pressure at least weekly, document, and provide at future appointments target a minimum of 150 minutes of moderate intensity exercise weekly  Follow Up Plan: Telephone follow up appointment with care management team member scheduled for: 4 months       Patient verbalizes understanding of instructions and care plan provided  today and agrees to view in [facility] Active MyChart status and patient understanding of how to access instructions and care plan via MyChart confirmed with patient.    The pharmacy team will reach out to the patient again over the next 14 days.   MViona Gilmore RVa [facility]

## 2021-08-19 NOTE — Telephone Encounter (Signed)
  Chronic Care Management   Outreach Note  08/19/2021 Name: Miranda [name] MRN: [id number] DOB: Jul 23, 1945  Referred by: [name], [name], MD  Patient had a phone appointment scheduled with clinical pharmacist today.  An unsuccessful telephone outreach was attempted today. The patient was referred to the pharmacist for assistance with care management and care coordination.   If possible, a message was left to return call to: [phone] or to [facility]: [place], PharmD, [place] Pharmacist [place] [facility] [place]

## 2021-08-19 NOTE — Progress Notes (Signed)
Chronic Care Management Pharmacy Note  08/26/2021 Name:  Miranda [name] MRN:  [id number] DOB:  [birth year]   Summary: BP not at goal < 140/90 per home readings  Vitamin D not at goal  Recommendations/Changes made from today's visit: -Recommended daily BP monitoring at home until office visit with PCP -Recommended scheduling lab visit for repeat vitamin D level -Requested prescription for new freestyle libre 2 receiver   Plan: Scheduled PCP visit for BP follow up Follow up BP assessment in 1-2 months Follow up in 4 months  Subjective: Miranda [name] is an 76 y.o. year old female who is a primary patient of [name], [name], MD.  The CCM team was consulted for assistance with disease management and care coordination needs.    Engaged with patient by telephone for follow up visit in response to provider referral for pharmacy case management and/or care coordination services.   Consent to Services:  The patient was given information about Chronic Care Management services, agreed to services, and gave verbal consent prior to initiation of services.  Please see initial visit note for detailed documentation.   Patient Care Team: [name], [name], MD as PCP - General (Internal Medicine) [name], [name], MD as PCP - Cardiology (Cardiology) [name], MD as PCP - Electrophysiology (Cardiology) Miranda Napoleon, PA-C (Inactive) (Dermatology) Miranda [name], [facility] as Pharmacist (Pharmacist)  Recent office visits: 06/16/21 Miranda [name], MD: Patient presented via video for Microscopic hematuria and other concerns. No medication changes.  04/24/21 Miranda Frohlich, MD - Patient presented for hospital discharge follow up. Prescribed HCTZ 25 mg and Losartan Potassium 100 mg.   03/14/21 [name], [name], MD - Patient presented for Frequent Urination and other concerns. No medication changes.  Recent consult visits: 06/04/21 Miranda [name], OD (Optometry) - Patient presented for Glaucoma and other concerns. No medication changes.  05/14/21 [name], PA (cardiology): Patient presented for Afib follow up. Follow up in 4-6 months.  05/09/21 [name], MD (Cardiology) - Patient presented for Secondary hypercoagulable state and other concerns. No medication changes.   04/30/21 [name], MD (Pulmonology) - Patient presented for IPF and other concerns. No medication changes.  03/20/21 [name] (Rehab and Phys Med) - Patient presented for Lumbar Radiculopathy Injection. No medication changes noted.  01/20/21 [name], [name], MD (Cardiology) - Patient presented for Paroxysmal atrial fibrillation. No medication changes.  12/24/20 [name] (Physical Med) - Patient resented for Lumbar radiculopathy. No other details available.   12/19/20 [name] (Physical Med) - Patient resented for Cervical radiculopathy and other concerns. No other details available.  08/29/20 Wilfrid Lund, MD (gastro): Patient presented for chronic constipation. Provided Linzess samples.  05/06/20 Brand Males, MD (pulmonary): Patient presented for IPF research encounter.  04/19/20 [name], MD (cardiology): Patient presented for Afib follow up.   Hospital visits: Medication Reconciliation was completed by comparing discharge summary, patient's EMR and Pharmacy list, and upon discussion with patient.   Patient presented to [facility] on 04/14/21 due to Chest Pain. Patient has not been discharged yet   New?Medications Started at [facility] Discharge:?? -none   Medication Changes at Hospital Discharge: -Changed the following medication ALPRAZolam Duanne Moron)   Medications Discontinued at Hospital Discharge: -Stopped the following medication amLODipine 10 MG tablet (NORVASC) diclofenac sodium 1 % Gel (Voltaren) furosemide 20 MG tablet (LASIX) hydrochlorothiazide 25 MG tablet (HYDRODIURIL) losartan 100  MG tablet (COZAAR) polyethylene   Medications that remain the  same after Hospital Discharge:?? -All other medications will remain the same.    Objective:  Lab Results  Component Value Date   CREATININE 0.88 04/24/2021   BUN 9 04/24/2021   GFR 64.67 04/24/2021   GFRNONAA 58 (L) 04/17/2021   GFRAA >60 11/04/2017   NA 140 04/24/2021   K 4.3 04/24/2021   CALCIUM 9.4 04/24/2021   CO2 26 04/24/2021   GLUCOSE 76 04/24/2021    Lab Results  Component Value Date/Time   HGBA1C 6.0 03/14/2021 10:19 AM   HGBA1C 5.8 (A) 12/03/2020 03:05 PM   HGBA1C 5.8 (A) 06/20/2020 11:16 AM   HGBA1C 6.1 12/01/2019 10:25 AM   HGBA1C 6.0 02/02/2018 12:20 PM   GFR 64.67 04/24/2021 03:01 PM   GFR 75.22 10/12/2019 10:41 AM    Last diabetic Eye exam: No results found for: "HMDIABEYEEXA"  Last diabetic Foot exam: No results found for: "HMDIABFOOTEX"   Lab Results  Component Value Date   CHOL 130 04/14/2021   HDL 60 04/14/2021   LDLCALC 56 04/14/2021   LDLDIRECT 152.8 03/30/2011   TRIG 72 04/14/2021   CHOLHDL 2.2 04/14/2021       Latest Ref Rng & Units 04/24/2021    3:01 PM 04/14/2021    6:00 AM 12/01/2019   10:25 AM  Hepatic Function  Total Protein 6.0 - 8.3 g/dL 7.3  8.0  8.1   Albumin 3.5 - 5.2 g/dL 3.9  4.1    AST 0 - 37 U/L '26  26  20   ' ALT 0 - 35 U/L '24  21  18   ' Alk Phosphatase 39 - 117 U/L 36  39    Total Bilirubin 0.2 - 1.2 mg/dL 0.5  0.6  0.7     Lab Results  Component Value Date/Time   TSH 1.274 04/14/2021 11:26 AM   TSH 1.45 12/01/2019 10:25 AM   TSH 1.499 09/14/2017 04:58 PM   TSH 0.86 06/01/2014 11:45 AM       Latest Ref Rng & Units 04/15/2021    2:07 AM 04/14/2021    6:00 AM 09/09/2020   10:47 AM  CBC  WBC 4.0 - 10.5 K/uL 4.9  7.0  6.3   Hemoglobin 12.0 - 15.0 g/dL 10.6  13.0  12.8   Hematocrit 36.0 - 46.0 % 31.3  38.2  40.1   Platelets 150 - 400 K/uL 151  216  204     Lab Results  Component Value Date/Time   VD25OH 36.31 08/20/2021 09:00 AM   VD25OH 27.69 (L)  04/24/2021 03:01 PM    Clinical ASCVD: No  The 10-year ASCVD risk score (Arnett DK, et al., 2019) is: 25.9%   Values used to calculate the score:     Age: 76 years     Sex: Female     Is Non-Hispanic African American: Yes     Diabetic: Yes     Tobacco smoker: No     Systolic Blood Pressure: 300 mmHg     Is BP treated: Yes     HDL Cholesterol: 60 mg/dL     Total Cholesterol: 130 mg/dL       04/24/2021    2:42 PM 03/14/2021   10:05 AM 12/03/2020    5:12 PM  Depression screen PHQ 2/9  Decreased Interest 1 0 0  Down, Depressed, Hopeless 0 1 1  PHQ - 2 Score '1 1 1  ' Altered sleeping '2 1 1  ' Tired, decreased energy '2 1 1  ' Change in appetite 3 0  0  Feeling bad or failure about yourself  0 1 0  Trouble concentrating 1 0 0  Moving slowly or fidgety/restless 1 0 0  Suicidal thoughts 0 0 0  PHQ-9 Score '10 4 3  ' Difficult doing work/chores  Not difficult at all      CHA2DS2/VAS Stroke Risk Points  Current as of yesterday     5 >= 2 Points: High Risk  1 - 1.99 Points: Medium Risk  0 Points: Low Risk    Last Change: N/A      Details    This score determines the patient's risk of having a stroke if the  patient has atrial fibrillation.       Points Metrics  0 Has Congestive Heart Failure:  No    Current as of yesterday  1 Has Vascular Disease:  Yes    Current as of yesterday  1 Has Hypertension:  Yes    Current as of yesterday  1 Age:  4    Current as of yesterday  1 Has Diabetes:  Yes    Current as of yesterday  0 Had Stroke:  No  Had TIA:  No  Had Thromboembolism:  No    Current as of yesterday  1 Female:  Yes    Current as of yesterday     Social History   Tobacco Use  Smoking Status Never   Passive exposure: Never  Smokeless Tobacco Never   BP Readings from Last 3 Encounters:  05/14/21 140/70  05/08/21 124/60  04/30/21 130/68   Pulse Readings from Last 3 Encounters:  05/14/21 (!) 56  05/08/21 68  04/30/21 63   Wt Readings from Last 3 Encounters:   05/14/21 165 lb 12.8 oz (75.2 kg)  05/08/21 165 lb 3.2 oz (74.9 kg)  04/30/21 163 lb 3.2 oz (74 kg)   BMI Readings from Last 3 Encounters:  06/16/21 27.59 kg/m  05/14/21 27.59 kg/m  05/08/21 26.66 kg/m    Assessment/Interventions: Review of patient past medical history, allergies, medications, health status, including review of consultants reports, laboratory and other test data, was performed as part of comprehensive evaluation and provision of chronic care management services.   SDOH:  (Social Determinants of Health) assessments and interventions performed: No  SDOH Screenings   Alcohol Screen: Not on file  Depression (PHQ2-9): Medium Risk (04/24/2021)   Depression (PHQ2-9)    PHQ-2 Score: 10  Financial Resource Strain: Low Risk  (08/07/2019)   Overall Financial Resource Strain (CARDIA)    Difficulty of Paying Living Expenses: Not hard at all  Food Insecurity: No Food Insecurity (03/28/2019)   Hunger Vital Sign    Worried About Running Out of Food in the Last Year: Never true    Ran Out of Food in the Last Year: Never true  Housing: Not on file  Physical Activity: Not on file  Social Connections: Not on file  Stress: Not on file  Tobacco Use: Low Risk  (06/16/2021)   Patient History    Smoking Tobacco Use: Never    Smokeless Tobacco Use: Never    Passive Exposure: Never  Transportation Needs: No Transportation Needs (08/07/2019)   PRAPARE - Transportation    Lack of Transportation (Medical): No    Lack of Transportation (Non-Medical): No    CCM Care Plan  Allergies  Allergen Reactions   Fish Oil Anaphylaxis   Other Hives, Shortness Of Breath and Swelling    Allergic to cashew nuts and peanut oil.   Penicillins Anaphylaxis,  Hives and Swelling    Has patient had a PCN reaction causing immediate rash, facial/tongue/throat swelling, SOB or lightheadedness with hypotension: Face swelling and hives started first, then swelling of the throat  Has patient had a PCN reaction  causing severe rash involving mucus membranes or skin necrosis: Yes  Has patient had a PCN reaction that required hospitalization: No  Has patient had a PCN reaction occurring within the last 10 years: Yes  If all of the above answers are "NO", then may proceed with Cephalospor   Pneumococcal Vaccines Nausea And Vomiting   Cashew Nut Oil    Nitrofurantoin Macrocrystal Hives   Peanut Oil    Aspirin Itching and Rash   Bactrim [Sulfamethoxazole-Trimethoprim] Hives, Itching and Rash   Ciprofloxacin Hives, Itching and Rash   Ibuprofen Rash   Influenza Vaccines Hives   Ivp Dye [Iodinated Contrast Media] Hives, Itching and Rash    Gives benadryl to counteract symptoms   Latex Rash   Macrobid [Nitrofurantoin Monohydrate Macrocrystals] Hives   Shellfish Allergy Hives    Patient also allergic to seafood    Medications Reviewed Today     Reviewed by Miranda [name], [facility] (Pharmacist) on 08/26/21 at [place] List Status: <None>   Medication Order Taking? Sig Documenting Provider Last Dose Status Informant  Accu-Chek Softclix Lancets lancets [id number] No Use as instructed [name], [name], MD Taking Active Self  acetaminophen (TYLENOL) 325 MG tablet [id number] No Take 650 mg by mouth every 6 (six) hours as needed for moderate pain. [provider] Taking Active Self  albuterol (VENTOLIN HFA) 108 (90 Base) MCG/ACT inhaler [id number] No Inhale 1 puff into the lungs every 6 (six) hours as needed. [name], [name], MD Taking Active Self  ALPRAZolam Duanne Moron) 0.25 MG tablet [id number]  TAKE ONE TABLET BY MOUTH three times daily AS NEEDED [name], [name], MD  Active   atorvastatin (LIPITOR) 80 MG tablet [id number] No TAKE ONE TABLET BY MOUTH EVERY EVENING [name], [name], MD Taking Active Self  Continuous Blood Gluc Receiver (FREESTYLE LIBRE 14 DAY READER) DEVI [id number] No 1 each by Does not apply route daily. [name], [name], MD Taking Active  Self  Continuous Blood Gluc Sensor (FREESTYLE LIBRE 14 DAY SENSOR) [place] [id number]  USE TO check blood sugar daily as directed [name], [name], MD  Active   cyclobenzaprine (FLEXERIL) 5 MG tablet [id number] No TAKE ONE TABLET BY MOUTH EVERY EVENING  Patient taking differently: Take 5 mg by mouth at bedtime.   [name], [name], MD Taking Active Self  dronedarone (MULTAQ) 400 MG tablet [id number]  TAKE ONE TABLET BY MOUTH AT NOON and TAKE ONE TABLET BY MOUTH EVERY EVENING [name], Will Hassell Done, MD  Active   ezetimibe (ZETIA) 10 MG tablet [id number]  TAKE ONE TABLET BY MOUTH ONCE DAILY [name], [name], MD  Active   hydrochlorothiazide (HYDRODIURIL) 25 MG tablet [id number] No Take 1 tablet (25 mg total) by mouth daily. [name], [name], MD Taking Active   HYDROcodone-acetaminophen [facility]) 10-325 MG tablet [id number] No Take 1 tablet by mouth every 6 (six) hours as needed for pain. [provider] Taking Active Self  lactulose, encephalopathy, (ENULOSE) 10 GM/15ML SOLN [id number]  take 27ms BY MOUTH three times daily AS NEEDED FOR mild constipation HIsaac Bliss ERayford Halsted MD  Active   LINZESS 145 MCG CAPS capsule [id number] TAKE ONE CAPSULE BY MOUTH BEFORE BREAKFAST DLoletha CarrowHKirke Corin MD  Active   losartan (COZAAR) 100 MG tablet [id number] No Take 1 tablet (100 mg total) by mouth daily. [name], [name], MD Taking Active   metFORMIN (GLUCOPHAGE-XR) 500 MG 24 hr tablet [id number] No TAKE ONE TABLET BY MOUTH EVERY MORNING [name], [name], MD Taking Active Self  Pirfenidone 801 MG TABS [id number]  TAKE 1 TABLET BY MOUTH 3 TIMES A DAY. Cassandria Anger, RPH-CPP  Active   XARELTO 20 MG TABS tablet [id number] No TAKE ONE TABLET BY MOUTH EVERY EVENING [name], [name], MD Taking Active             Patient Active Problem List   Diagnosis Date Noted   Malnutrition of moderate degree 04/15/2021   Chest pain 04/14/2021   Elevated  troponin 04/14/2021   History of pulmonary embolus (PE) 04/14/2021   AKI (acute kidney injury) ([place]) 04/14/2021   Cervical radiculopathy 09/11/2020   Vitamin D deficiency 12/05/2019   Healthcare maintenance 11/09/2018   IPF (idiopathic pulmonary fibrosis) ([place]) 11/09/2018   Therapeutic drug monitoring 04/20/2018   Paroxysmal atrial fibrillation ([place]) 04/20/2018   Atrial fibrillation with RVR ([place]) 11/03/2017   LVH (left ventricular hypertrophy) 09/22/2017   Diabetes mellitus without complication ([place]) 37/16/9678   Degeneration of lumbar intervertebral disc 07/06/2017   Pulmonary emboli ([place]) 05/02/2016   Right leg pain 05/01/2016   ILD (interstitial lung disease) ([place]) 05/01/2016   Bradycardia 05/01/2016   Coronary artery calcification seen on CAT scan 08/06/2015   Open angle with borderline findings, low risk, bilateral 04/29/2015   UIP (usual interstitial pneumonitis) ([place]) 04/05/2015   Bilateral posterior capsular opacification 09/21/2014   Pseudophakia of both eyes 07/27/2014   Combined form of senile cataract of both eyes 05/30/2014   Osteoarthritis 09/17/2011   Narrowing of intervertebral disc space 09/17/2011   Pyelonephritis with possible nonobstructing 5 mm calculus by renal ultrasound 09/16/2011   Hypokalemia 09/16/2011   External hemorrhoids 09/13/2011   Obstructive sleep apnea syndrome 06/10/2011   Anal fissure 02/17/2011   History of colonic polyps 04/03/2010   Respiratory condition due to chemical fumes and vapors ([place]) 01/20/2010   Urinary incontinence 04/30/2009   Insomnia 09/28/2007   Asthma 11/22/2006   Dyslipidemia 08/19/2006   HTN (hypertension) 08/19/2006   GERD 08/19/2006   Low back pain 08/19/2006    Immunization History  Administered Date(s) Administered   Fluad Quad(high Dose 65+) 11/29/2019, 12/03/2020   PFIZER(Purple Top)SARS-COV-2 Vaccination 04/21/2019, 05/12/2019, 12/15/2019   PPD Test 11/19/2010   Pneumococcal Polysaccharide-23 11/29/2019    Patient is usually not taking the metformin on most days.  Patient reports her Elenor Legato device will no longer charge. She reports her blood sugars have been in the 60s a couple of times. Patient only takes it every couple of weeks. Patient drinks coffee every morning and sometimes a little can of mountain dew.  Patient does have headaches sometimes and rarely has headaches usually.  Conditions to be addressed/monitored:  Hypertension, Hyperlipidemia, Diabetes, Atrial Fibrillation, Asthma, Anxiety and fluid retention, back pain, constipation, IPF  Conditions addressed this visit: Hypertension, diabetes  Care Plan : CCM Pharmacy Care Plan  Updates made by Miranda [name], [place] since 08/26/2021 12:00 AM     Problem: Problem: Hypertension, Hyperlipidemia, Diabetes, Atrial Fibrillation, Asthma, Anxiety and fluid retention, back pain, constipation, IPF      Long-Range Goal: Patient-Specific Goal   Start Date: 06/10/2020  Expected End Date: 05/13/2021  Recent Progress: On track  Priority: High  Note:   Current Barriers:  Unable to independently monitor therapeutic efficacy Unable to self administer medications as prescribed  Pharmacist Clinical Goal(s):  Patient will achieve adherence to monitoring guidelines and medication adherence to achieve therapeutic efficacy achieve ability to self administer medications as prescribed through use of adherence packaging as evidenced by patient report through collaboration with PharmD and provider.   Interventions: 1:1 collaboration with [name], [name], MD regarding development and update of comprehensive plan of care as evidenced by provider attestation and co-signature Inter-disciplinary care team collaboration (see longitudinal plan of care) Comprehensive medication review performed; medication list updated in electronic medical record  Hypertension (BP goal <140/90) -Controlled -Current treatment: HCTZ 25 mg 1 tablet daily -  Appropriate, Query effective, Safe, Accessible Losartan 100 mg 1 tablet daily - Appropriate, Query effective, Safe, Accessible -Medications previously tried: benazepril, metoprolol, HCTZ, losartan, amlodipine  -Current home readings: 124/63 HR 58, 151/71 HR 75, 148/75 HR 71, 125/58 HR 54, patient just bought a BP cuff and plans to start using -Current dietary habits: patient reports working on it and has cut out salt and pork; patient tries to eat a lot more fish and vegetables -Current exercise habits: patient reports she walks most days; recommended goal of 150 min/week  -Denies hypotensive/hypertensive symptoms -Educated on Daily salt intake goal < 2300 mg; Exercise goal of 150 minutes per week; Importance of home blood pressure monitoring; -Counseled to monitor BP at home at least weekly, document, and provide log at future appointments -Counseled on diet and exercise extensively Recommended to continue current medication  Hyperlipidemia: (LDL goal < 55) -Not ideally controlled -Current treatment: Atorvastatin 70m, 1 tablet once daily - Appropriate, Query effective, Safe, Accessible Ezetimibe 10 mg, 1 tablet once daily - Appropriate, Query effective, Safe, Accessible -Medications previously tried: lovastatin  -Current dietary patterns: patient tries to eat a lot more fish and vegetables -Current exercise habits:  patient reports she walks most days; recommended goal of 150 min/week  -Educated on Cholesterol goals;  Benefits of statin for ASCVD risk reduction; Importance of limiting foods high in cholesterol; Exercise goal of 150 minutes per week; -Counseled on diet and exercise extensively Recommended to continue current medication  Diabetes (A1c goal <7%) -Controlled -Current medications: Metformin XR 500 mg 1 tablet before breakfast - Appropriate, Effective, Query Safe, Accessible -Medications previously tried: none  -Current home glucose readings: 60s more than usual;  80s having lows in the morning (9% of BG readings were < 70).  fasting glucose: n/a post prandial glucose: n/a -Denies hypoglycemic/hyperglycemic symptoms -Current meal patterns:  breakfast: n/a  lunch: n/a  dinner: n/a snacks: n/a drinks: n/a -Current exercise: walking daily -Educated on Exercise goal of 150 minutes per week; Prevention and management of hypoglycemic episodes; Continuous glucose monitoring; Carbohydrate counting and/or plate method -Counseled to check feet daily and get yearly eye exams -Collaborated with PCP about trial off of metformin to see if the low BGs stop  Atrial Fibrillation (Goal: prevent stroke and major bleeding) -Controlled -CHADSVASC: 5 -Current treatment: Rate control: Multaq 400 mg 1 tablet twice daily with a meal - Appropriate, Effective, Safe, Accessible Anticoagulation: Xarelto 20 mg 1 tablet daily with dinner - Appropriate, Effective, Safe, Accessible -Medications previously tried: none -Home BP and HR readings: did not provide any -Counseled on  the importance of taking both medications with a meal -Recommended to continue current medication Patient has resumed Xarelto since surgery.  Asthma/IPF (Goal: control symptoms and prevent exacerbations) -Controlled -Current treatment  Albuterol HFA 1051m, act inhaler, inhale 1 puff every  six hours as needed  - Appropriate, Effective, Safe, Accessible Pirfenidone 852m, 1 tablet in the morning, at noon, and at bedtime  - Appropriate, Effective, Safe, Accessible -Medications previously tried: none  -Patient denies consistent use of maintenance inhaler -Frequency of rescue inhaler use: as needed -Counseled on Proper inhaler technique; When to use rescue inhaler -Recommended to continue current medication   Fluid retention (Goal: minimize swelling) -Controlled -Current treatment  None -Medications previously tried: furosemide  -Recommended to continue current medication Counseled on  limiting salt intake to prevent fluid retention  Back pain (Goal: minimize symptoms of pain) -Controlled -Current treatment  Hydrocodone/APAP 10/3242m 1 tablet every six hours as needed (takes as indicated) - Appropriate, Effective, Safe, Accessible Cyclobenzaprine 18m72m1 tablet at bedtime  - Appropriate, Effective, Safe, Accessible Diclofenac 1% gel, apply 2 g four times daily  - Appropriate, Effective, Safe, Accessible APAP 500 mg, 1 tablet as needed for pain - Appropriate, Effective, Safe, Accessible -Medications previously tried: meloxicam, naproxen, oxycodone  -Recommended to continue current medication  Anxiety (Goal: minimize symptoms) -Not ideally controlled -Current treatment: Alprazolam 0.218m48m1 tablet three times daily as needed for anxiety - Appropriate, Effective, Query Safe, Accessible -Medications previously tried/failed: none -GAD7: n/a -Educated on Benefits of medication for symptom control -Recommended to continue current medication  Constipation (Goal: normal bowel movements) -Controlled -Current treatment  docusate 100mg66mcapsule twice daily - Appropriate, Effective, Safe, Accessible Lactulose solution, take 30 ML three times daily as needed for mild constipation - not taking right now Miralax, 17g one daily (not a part of daily regimen) Linzess 145 mcg daily - Appropriate, Effective, Safe, Query accessible -Medications previously tried: bisacodyl, Linzess (previous loose stools)  -Recommended to continue current medication Assessed patient finances. Provided PAP application for Linzess in office.   Health Maintenance -Vaccine gaps: shingles, tetanus -Current therapy:  Biotin 5000 mcg 1 capsule daily -Educated on Cost vs benefit of each product must be carefully weighed by individual consumer -Patient is satisfied with current therapy and denies issues -Recommended to continue current medication  Patient Goals/Self-Care Activities Patient will:  -  take medications as prescribed check blood pressure at least weekly, document, and provide at future appointments target a minimum of 150 minutes of moderate intensity exercise weekly  Follow Up Plan: The care management team will reach out to the patient again over the next 30 days.         Medication Assistance: None required.  Patient affirms current coverage meets needs.   Compliance/Adherence/Medication fill history: Care Gaps: Shingrix, foot exam, Hep C screening, eye exam, tetanus, prevnar 20, COVID booster BP- 140/70 ( 05/14/21) A1c - 6.0 (03/14/21)  Star-Rating Drugs: Metformin (Glucophage XR) 500 mg - Last filled 02/17/2021 30 DS at [name] (Pt has been declining do to on hand supply) Losartan (Cozaar) 100 mg - Last filled 07/15/2021 30 DS at [name] Atorvastatin (Lipitor) 80 mg - Last filled 07/15/2021 30 DS at [name]  Patient's preferred pharmacy is:  [name] [id number] [address]SuiteO[id number]: [id number], [place] - [address] [place] (SE) [id number]: [phone] [address]CIHarve[place]- [place]M[id number]: [phone] pill box? Yes Pt endorses 90% compliance   We discussed: Benefits of medication synchronization, packaging and  delivery as well as enhanced pharmacist oversight with [name]. Patient decided to: Utilize [name] pharmacy for medication synchronization, packaging and delivery  Care Plan and Follow Up Patient Decision:  Patient agrees to Care Plan and Follow-up.  Plan: Telephone follow up appointment with care management team member scheduled for:  4 months  [name], PharmD [place] Pharmacist [place] at  [place] [phone]

## 2021-08-20 ENCOUNTER — Other Ambulatory Visit (INDEPENDENT_AMBULATORY_CARE_PROVIDER_SITE_OTHER): Payer: HMO

## 2021-08-20 DIAGNOSIS — E559 Vitamin D deficiency, unspecified: Secondary | ICD-10-CM | POA: Diagnosis not present

## 2021-08-20 LAB — VITAMIN D 25 HYDROXY (VIT D DEFICIENCY, FRACTURES): VITD: 36.31 ng/mL (ref 30.00–100.00)

## 2021-08-26 ENCOUNTER — Telehealth: Payer: Self-pay | Admitting: *Deleted

## 2021-08-26 ENCOUNTER — Ambulatory Visit: Payer: HMO | Admitting: Internal Medicine

## 2021-08-26 DIAGNOSIS — I10 Essential (primary) hypertension: Secondary | ICD-10-CM

## 2021-08-26 MED ORDER — FREESTYLE LIBRE 14 DAY READER DEVI: 1.0000 | Freq: Every day | 2 refills | Status: DC

## 2021-08-26 NOTE — Telephone Encounter (Signed)
-----   Message from [name], [facility] sent at 08/26/2021  9:06 AM EDT ----- Regarding: Freestyle libre 2 receiver Hi,  Can you possibly send over a refill of the freestyle libre 2 receiver for Ms. Suleiman to [name]? Her device doesn't seem to be working and we wanted to see if her insurance would cover a new one.  Thanks, Maddie

## 2021-08-28 ENCOUNTER — Telehealth: Payer: Self-pay | Admitting: *Deleted

## 2021-08-28 MED ORDER — FREESTYLE LIBRE 2 SENSOR MISC: 1.0000 | Freq: Every day | 6 refills | Status: DC

## 2021-08-28 MED ORDER — FREESTYLE LIBRE 2 READER DEVI: 1.0000 | Freq: Every day | 6 refills | Status: DC

## 2021-08-28 NOTE — Telephone Encounter (Signed)
Refill sent.

## 2021-08-28 NOTE — Telephone Encounter (Signed)
-----   Message from [name], [facility] sent at 08/27/2021  4:44 PM EDT ----- Regarding: Freestyle libre 2 sensors Hi,  Can you please send a refill of her freestyle libre sensors to [name]? They need to change to the freestyle libre 2 sensors because the 14 day that's listed has been discontinued and they need an updated prescription.  Thank you! Maddie

## 2021-09-04 DIAGNOSIS — M5030 Other cervical disc degeneration, unspecified cervical region: Secondary | ICD-10-CM | POA: Diagnosis not present

## 2021-09-04 DIAGNOSIS — M5416 Radiculopathy, lumbar region: Secondary | ICD-10-CM | POA: Diagnosis not present

## 2021-09-04 DIAGNOSIS — M5136 Other intervertebral disc degeneration, lumbar region: Secondary | ICD-10-CM | POA: Diagnosis not present

## 2021-09-05 ENCOUNTER — Telehealth: Payer: Self-pay | Admitting: Pharmacist

## 2021-09-05 ENCOUNTER — Other Ambulatory Visit: Payer: Self-pay | Admitting: Internal Medicine

## 2021-09-05 DIAGNOSIS — G8929 Other chronic pain: Secondary | ICD-10-CM

## 2021-09-05 DIAGNOSIS — E785 Hyperlipidemia, unspecified: Secondary | ICD-10-CM

## 2021-09-07 ENCOUNTER — Other Ambulatory Visit: Payer: Self-pay | Admitting: Internal Medicine

## 2021-09-07 DIAGNOSIS — F419 Anxiety disorder, unspecified: Secondary | ICD-10-CM

## 2021-09-08 NOTE — Telephone Encounter (Signed)
Last Rx given on 03/14/2021 #90 with 1 ref

## 2021-09-12 DIAGNOSIS — I4891 Unspecified atrial fibrillation: Secondary | ICD-10-CM

## 2021-09-12 DIAGNOSIS — I10 Essential (primary) hypertension: Secondary | ICD-10-CM

## 2021-09-12 DIAGNOSIS — J45909 Unspecified asthma, uncomplicated: Secondary | ICD-10-CM | POA: Diagnosis not present

## 2021-09-12 DIAGNOSIS — E1159 Type 2 diabetes mellitus with other circulatory complications: Secondary | ICD-10-CM

## 2021-09-18 ENCOUNTER — Encounter: Payer: Self-pay | Admitting: Internal Medicine

## 2021-09-18 ENCOUNTER — Ambulatory Visit (INDEPENDENT_AMBULATORY_CARE_PROVIDER_SITE_OTHER): Payer: HMO | Admitting: Internal Medicine

## 2021-09-18 VITALS — BP 130/70 | HR 60 | Ht 65.0 in | Wt 175.4 lb

## 2021-09-18 DIAGNOSIS — I4891 Unspecified atrial fibrillation: Secondary | ICD-10-CM

## 2021-09-18 DIAGNOSIS — E1169 Type 2 diabetes mellitus with other specified complication: Secondary | ICD-10-CM | POA: Diagnosis not present

## 2021-09-18 DIAGNOSIS — J84112 Idiopathic pulmonary fibrosis: Secondary | ICD-10-CM | POA: Diagnosis not present

## 2021-09-18 DIAGNOSIS — E782 Mixed hyperlipidemia: Secondary | ICD-10-CM

## 2021-09-18 DIAGNOSIS — I10 Essential (primary) hypertension: Secondary | ICD-10-CM

## 2021-09-18 LAB — POCT GLYCOSYLATED HEMOGLOBIN (HGB A1C): Hemoglobin A1C: 5.9 % — AB (ref 4.0–5.6)

## 2021-09-18 LAB — HEMOGLOBIN A1C: Hgb A1c MFr Bld: 6.4 % (ref 4.6–6.5)

## 2021-09-18 LAB — COMPREHENSIVE METABOLIC PANEL
ALT: 31 U/L (ref 0–35)
AST: 27 U/L (ref 0–37)
Albumin: 4.5 g/dL (ref 3.5–5.2)
Alkaline Phosphatase: 39 U/L (ref 39–117)
BUN: 18 mg/dL (ref 6–23)
CO2: 31 mEq/L (ref 19–32)
Calcium: 10.4 mg/dL (ref 8.4–10.5)
Chloride: 101 mEq/L (ref 96–112)
Creatinine, Ser: 0.94 mg/dL (ref 0.40–1.20)
GFR: 59.58 mL/min — ABNORMAL LOW (ref >60.00)
Glucose, Bld: 62 mg/dL — ABNORMAL LOW (ref 70–99)
Potassium: 4 mEq/L (ref 3.5–5.1)
Sodium: 134 mEq/L — ABNORMAL LOW (ref 135–145)
Total Bilirubin: 0.4 mg/dL (ref 0.2–1.2)
Total Protein: 8 g/dL (ref 6.0–8.3)

## 2021-09-18 LAB — CBC WITH DIFFERENTIAL/PLATELET
Basophils Absolute: 0 10*3/uL (ref 0.0–0.1)
Basophils Relative: 0.2 % (ref 0.0–3.0)
Eosinophils Absolute: 0.7 10*3/uL (ref 0.0–0.7)
Eosinophils Relative: 12 % — ABNORMAL HIGH (ref 0.0–5.0)
HCT: 38.3 % (ref 36.0–46.0)
Hemoglobin: 12.7 g/dL (ref 12.0–15.0)
Lymphocytes Relative: 33.2 % (ref 12.0–46.0)
Lymphs Abs: 1.9 10*3/uL (ref 0.7–4.0)
MCHC: 33.1 g/dL (ref 30.0–36.0)
MCV: 85.4 fl (ref 78.0–100.0)
Monocytes Absolute: 0.8 10*3/uL (ref 0.1–1.0)
Monocytes Relative: 15.1 % — ABNORMAL HIGH (ref 3.0–12.0)
Neutro Abs: 2.2 10*3/uL (ref 1.4–7.7)
Neutrophils Relative %: 39.5 % — ABNORMAL LOW (ref 43.0–77.0)
Platelets: 188 10*3/uL (ref 150.0–400.0)
RBC: 4.49 Mil/uL (ref 3.87–5.11)
RDW: 13.7 % (ref 11.5–15.5)
WBC: 5.6 10*3/uL (ref 4.0–10.5)

## 2021-09-18 LAB — MICROALBUMIN / CREATININE URINE RATIO
Creatinine,U: 100.7 mg/dL
Microalb Creat Ratio: 0.7 mg/g (ref 0.0–30.0)
Microalb, Ur: <0.7 mg/dL (ref 0.0–1.9)

## 2021-09-18 NOTE — Progress Notes (Signed)
Established Patient Office Visit     CC/Reason for Visit: Follow-up chronic conditions  HPI: Miranda [name] is a 76 y.o. female who is coming in today for the above mentioned reasons. Past Medical History is significant for: Hypertension, type 2 diabetes, hyperlipidemia, A-fib anticoagulated on Xarelto, idiopathic pulmonary fibrosis.  She is also prescribed hydrocodone by a pain clinic.  She is feeling well.  She has been having a slight cough and is wondering if she can take an old prescription for benzonatate.  She has no sputum production, no fever.   Past Medical/Surgical History: Past Medical History:  Diagnosis Date   Anxiety    Arthritis    Asthma    Atrial fibrillation (HCC)    CHF (congestive heart failure) ([place])    pt. unsure- but thinks she was hosp. for CHF- 2002   [facility]   Clotting disorder ([place])    blood clots in lungs/PE pulmonary embolism   Colon polyps    Complication of anesthesia    states requires a lot med. to put her to sleep    DDD (degenerative disc disease) 09/17/2011   Depression    "sometimes "   DM (diabetes mellitus) ([place])    Family history of anesthesia complication    Sister had difficulty waking up from anesthesia   GERD (gastroesophageal reflux disease)    Glaucoma    bilateral, pt. admits that she is noncompliant to eye gtts.    Hemorrhoids    Hyperlipidemia    Hypertension    had stress, echo- 2006 /w [place], Cardiac Cath, per pt. 2002, echo repeated 2012- wnl    IPF (idiopathic pulmonary fibrosis) (HCC)    Low back pain    Shortness of breath    Sleep apnea    uses c-pap- q night recently    Past Surgical History:  Procedure Laterality Date   ABDOMINAL HYSTERECTOMY     ectopic, fibroids   ANTERIOR CERVICAL DECOMP/DISCECTOMY FUSION N/A 09/11/2020   Procedure: Anterior Cervical Decompression Fusion Cervical four-five, Cervical five-six;  Surgeon: [name] Mare, MD;  Location:  [facility];  Service: Neurosurgery;  Laterality: N/A;   arm surgery Right    CARDIAC CATHETERIZATION     CATARACT EXTRACTION, BILATERAL     cataracts removed bilateral- ?IOL   COLONOSCOPY     remote   FISSURECTOMY  10/08/2011   Procedure: FISSURECTOMY;  Surgeon: Stark Klein, MD;  Location: [facility];  Service: General;  Laterality: N/A;   FLEXIBLE SIGMOIDOSCOPY  02/25/2011   Procedure: FLEXIBLE SIGMOIDOSCOPY;  Surgeon: Inda Castle, MD;  Location: [facility];  Service: Endoscopy;  Laterality: N/A;   FOOT SURGERY     bilat, heel spurs- screw in R foot    HEMORRHOID SURGERY  10/08/2011   Procedure: HEMORRHOIDECTOMY;  Surgeon: Stark Klein, MD;  Location: [facility];  Service: General;  Laterality: N/A;  External    ROTATOR CUFF REPAIR Right    SPHINCTEROTOMY  10/08/2011   Procedure: Joan Mayans;  Surgeon: Stark Klein, MD;  Location: [facility];  Service: General;  Laterality: N/A;    Social History:  reports that she has never smoked. She has never been exposed to tobacco smoke. She has never used smokeless tobacco. She reports that she does not drink alcohol and does not use drugs.  Allergies: Allergies  Allergen Reactions   Fish Oil Anaphylaxis   Other Hives, Shortness Of Breath and Swelling    Allergic to  cashew nuts and peanut oil.   Penicillins Anaphylaxis, Hives and Swelling    Has patient had a PCN reaction causing immediate rash, facial/tongue/throat swelling, SOB or lightheadedness with hypotension: Face swelling and hives started first, then swelling of the throat  Has patient had a PCN reaction causing severe rash involving mucus membranes or skin necrosis: Yes  Has patient had a PCN reaction that required hospitalization: No  Has patient had a PCN reaction occurring within the last 10 years: Yes  If all of the above answers are "NO", then may proceed with Cephalospor   Pneumococcal Vaccines Nausea And Vomiting   Cashew Nut Oil    Nitrofurantoin Macrocrystal Hives   Peanut Oil     Aspirin Itching and Rash   Bactrim [Sulfamethoxazole-Trimethoprim] Hives, Itching and Rash   Ciprofloxacin Hives, Itching and Rash   Ibuprofen Rash   Influenza Vaccines Hives   Ivp Dye [Iodinated Contrast Media] Hives, Itching and Rash    Gives benadryl to counteract symptoms   Latex Rash   Macrobid [Nitrofurantoin Monohydrate Macrocrystals] Hives   Shellfish Allergy Hives    Patient also allergic to seafood    Family History:  Family History  Problem Relation Age of Onset   Heart attack Mother    Heart disease Mother    Breast cancer Mother    Emphysema Sister    Breast cancer Sister    Arthritis/Rheumatoid Sister    Asthma Sister    Lung cancer Sister    COPD Sister    Colon cancer Brother    Colon cancer Brother    Anesthesia problems Neg Hx    Esophageal cancer Neg Hx    Rectal cancer Neg Hx    Stomach cancer Neg Hx      Current Outpatient Medications:    Accu-Chek Softclix Lancets lancets, Use as instructed, Disp: 100 each, Rfl: 12   acetaminophen (TYLENOL) 325 MG tablet, Take 650 mg by mouth every 6 (six) hours as needed for moderate pain., Disp: , Rfl:    albuterol (VENTOLIN HFA) 108 (90 Base) MCG/ACT inhaler, Inhale 1 puff into the lungs every 6 (six) hours as needed., Disp: 18 g, Rfl: 1   ALPRAZolam (XANAX) 0.25 MG tablet, TAKE ONE TABLET BY MOUTH three times daily AS NEEDED, Disp: 90 tablet, Rfl: 0   atorvastatin (LIPITOR) 80 MG tablet, TAKE ONE TABLET BY MOUTH EVERY EVENING, Disp: 90 tablet, Rfl: 1   Continuous Blood Gluc Receiver (FREESTYLE LIBRE 2 READER) DEVI, 1 each by Does not apply route daily., Disp: 2 each, Rfl: 6   Continuous Blood Gluc Sensor (FREESTYLE LIBRE 2 SENSOR) MISC, 1 each by Does not apply route daily., Disp: 2 each, Rfl: 6   cyclobenzaprine (FLEXERIL) 5 MG tablet, TAKE ONE TABLET BY MOUTH EVERY EVENING, Disp: 90 tablet, Rfl: 1   dronedarone (MULTAQ) 400 MG tablet, TAKE ONE TABLET BY MOUTH AT NOON and TAKE ONE TABLET BY MOUTH EVERY EVENING,  Disp: 180 tablet, Rfl: 2   ezetimibe (ZETIA) 10 MG tablet, TAKE ONE TABLET BY MOUTH ONCE DAILY, Disp: 90 tablet, Rfl: 1   hydrochlorothiazide (HYDRODIURIL) 25 MG tablet, Take 1 tablet (25 mg total) by mouth daily., Disp: 90 tablet, Rfl: 1   HYDROcodone-acetaminophen (NORCO) 10-325 MG tablet, Take 1 tablet by mouth every 6 (six) hours as needed for pain., Disp: , Rfl:    lactulose, encephalopathy, (ENULOSE) 10 GM/15ML SOLN, take 80ms BY MOUTH three times daily AS NEEDED FOR mild constipation, Disp: 473 mL, Rfl: 1  LINZESS 145 MCG CAPS capsule, TAKE ONE CAPSULE BY MOUTH BEFORE BREAKFAST, Disp: 30 capsule, Rfl: 2   losartan (COZAAR) 100 MG tablet, Take 1 tablet (100 mg total) by mouth daily., Disp: 90 tablet, Rfl: 1   metFORMIN (GLUCOPHAGE-XR) 500 MG 24 hr tablet, TAKE ONE TABLET BY MOUTH EVERY MORNING, Disp: 90 tablet, Rfl: 0   Pirfenidone 801 MG TABS, TAKE 1 TABLET BY MOUTH 3 TIMES A DAY., Disp: 180 tablet, Rfl: 0   XARELTO 20 MG TABS tablet, TAKE ONE TABLET BY MOUTH EVERY EVENING, Disp: 90 tablet, Rfl: 1  Review of Systems:  Constitutional: Denies fever, chills, diaphoresis, appetite change and fatigue.  HEENT: Denies photophobia, eye pain, redness, hearing loss, ear pain, congestion, sore throat, rhinorrhea, sneezing, mouth sores, trouble swallowing, neck pain, neck stiffness and tinnitus.   Respiratory: Denies SOB, DOE,  chest tightness,  and wheezing.   Cardiovascular: Denies chest pain, palpitations and leg swelling.  Gastrointestinal: Denies nausea, vomiting, abdominal pain, diarrhea, constipation, blood in stool and abdominal distention.  Genitourinary: Denies dysuria, urgency, frequency, hematuria, flank pain and difficulty urinating.  Endocrine: Denies: hot or cold intolerance, sweats, changes in hair or nails, polyuria, polydipsia. Musculoskeletal: Denies myalgias, back pain, joint swelling, arthralgias and gait problem.  Skin: Denies pallor, rash and wound.  Neurological: Denies  dizziness, seizures, syncope, weakness, light-headedness, numbness and headaches.  Hematological: Denies adenopathy. Easy bruising, personal or family bleeding history  Psychiatric/Behavioral: Denies suicidal ideation, mood changes, confusion, nervousness, sleep disturbance and agitation    Physical Exam: Vitals:   09/18/21 0940 09/18/21 0956 09/18/21 1031  BP: (!) 142/72 140/60 130/70  Pulse: 60    SpO2: 98%    Weight: 175 lb 6.4 oz (79.6 kg)    Height: '5\' 5"'$  (1.651 m)      Body mass index is 29.19 kg/m.   Constitutional: NAD, calm, comfortable Eyes: PERRL, lids and conjunctivae normal ENMT: Mucous membranes are moist.  Respiratory: clear to auscultation bilaterally, no wheezing, no crackles. Normal respiratory effort. No accessory muscle use.  Cardiovascular: Regular rate and rhythm, no murmurs / rubs / gallops. No extremity edema.  Psychiatric: Normal judgment and insight. Alert and oriented x 3. Normal mood.    Impression and Plan:  Type 2 diabetes mellitus with other specified complication, without long-term current use of insulin ([place])  - Plan: POC HgB A1c  Atrial fibrillation with RVR (HCC) -Rate controlled and anticoagulated on Xarelto  IPF (idiopathic pulmonary fibrosis) (HCC) -Followed by pulmonary  Essential hypertension -Fairly well controlled  DM type 2 with diabetic mixed hyperlipidemia ([place]) - Plan: CBC with Differential/Platelet, Comprehensive metabolic panel, Hemoglobin A1c, Microalbumin / creatinine urine ratio -Check microalbumin, lipids.  She is overdue for an eye exam she will call her ophthalmologist to schedule. -Check A1c today.    Time spent:30 minutes reviewing chart, interviewing and examining patient and formulating plan of care.      [name], MD [facility] at [facility]

## 2021-09-22 ENCOUNTER — Telehealth: Payer: Self-pay | Admitting: Pharmacist

## 2021-09-22 DIAGNOSIS — J84112 Idiopathic pulmonary fibrosis: Secondary | ICD-10-CM

## 2021-09-22 MED ORDER — PIRFENIDONE 801 MG PO TABS: 1.0000 | ORAL_TABLET | Freq: Three times a day (TID) | ORAL | 0 refills | Status: DC

## 2021-09-22 NOTE — Telephone Encounter (Signed)
Refill sent for PIRFENIDONE to [facility] (pulmonary fibrosis team): [phone]  Dose: 801 mg three times daily  Last OV: 05/28/21 Provider: Dr. Vaughan Browner  Next OV: 11/12/21  CMP on 09/18/21 wnl  Knox Saliva, PharmD, MPH, BCPS Clinical Pharmacist (Rheumatology and Pulmonology)

## 2021-09-23 MED ORDER — PIRFENIDONE 801 MG PO TABS: 1.0000 | ORAL_TABLET | Freq: Three times a day (TID) | ORAL | 0 refills | Status: DC

## 2021-09-23 NOTE — Addendum Note (Signed)
Addended by: Cassandria Anger on: 09/23/2021 03:03 PM   Modules accepted: Orders

## 2021-10-06 ENCOUNTER — Other Ambulatory Visit: Payer: Self-pay | Admitting: Gastroenterology

## 2021-10-06 ENCOUNTER — Other Ambulatory Visit: Payer: Self-pay | Admitting: Internal Medicine

## 2021-10-06 ENCOUNTER — Telehealth: Payer: Self-pay | Admitting: Pharmacist

## 2021-10-06 DIAGNOSIS — I10 Essential (primary) hypertension: Secondary | ICD-10-CM

## 2021-10-06 NOTE — Chronic Care Management (AMB) (Signed)
Chronic Care Management Pharmacy Assistant   Name: Miranda [name]  MRN: [id number] DOB: 1945-03-26  Reason for Encounter: Medication Review Medication Coordination   Recent office visits:  09/18/21 [name], [name], MD - Patient presented for Type 2 diabetes mellitus with other specified complication without long term current use of insulin. No medication changes.  Recent consult visits:  none  Hospital visits:  None in previous 6 months  Medications: Outpatient Encounter Medications as of 10/06/2021  Medication Sig   Accu-Chek Softclix Lancets lancets Use as instructed   acetaminophen (TYLENOL) 325 MG tablet Take 650 mg by mouth every 6 (six) hours as needed for moderate pain.   albuterol (VENTOLIN HFA) 108 (90 Base) MCG/ACT inhaler Inhale 1 puff into the lungs every 6 (six) hours as needed.   ALPRAZolam (XANAX) 0.25 MG tablet TAKE ONE TABLET BY MOUTH three times daily AS NEEDED   atorvastatin (LIPITOR) 80 MG tablet TAKE ONE TABLET BY MOUTH EVERY EVENING   Continuous Blood Gluc Receiver (FREESTYLE LIBRE 2 READER) DEVI 1 each by Does not apply route daily.   Continuous Blood Gluc Sensor (FREESTYLE LIBRE 2 SENSOR) MISC 1 each by Does not apply route daily.   cyclobenzaprine (FLEXERIL) 5 MG tablet TAKE ONE TABLET BY MOUTH EVERY EVENING   dronedarone (MULTAQ) 400 MG tablet TAKE ONE TABLET BY MOUTH AT NOON and TAKE ONE TABLET BY MOUTH EVERY EVENING   ezetimibe (ZETIA) 10 MG tablet TAKE ONE TABLET BY MOUTH ONCE DAILY   hydrochlorothiazide (HYDRODIURIL) 25 MG tablet TAKE ONE TABLET BY MOUTH ONCE DAILY   HYDROcodone-acetaminophen (NORCO) 10-325 MG tablet Take 1 tablet by mouth every 6 (six) hours as needed for pain.   lactulose, encephalopathy, (ENULOSE) 10 GM/15ML SOLN take 15ms BY MOUTH three times daily AS NEEDED FOR mild constipation   LINZESS 145 MCG CAPS capsule TAKE ONE CAPSULE BY MOUTH BEFORE BREAKFAST   losartan (COZAAR) 100 MG tablet TAKE ONE TABLET BY MOUTH EVERY  EVENING   metFORMIN (GLUCOPHAGE-XR) 500 MG 24 hr tablet TAKE ONE TABLET BY MOUTH EVERY MORNING   Pirfenidone 801 MG TABS Take 1 tablet by mouth 3 (three) times daily.   XARELTO 20 MG TABS tablet TAKE ONE TABLET BY MOUTH EVERY EVENING   No facility-administered encounter medications on file as of 10/06/2021.    BP Readings from Last 3 Encounters:  09/18/21 130/70  05/14/21 140/70  05/08/21 124/60    Lab Results  Component Value Date   HGBA1C 6.4 09/18/2021     Patient obtains medications through Adherence Packaging  30 Days   Last adherence delivery included Hydrochlorothiazide 25 mg : take one tab at breakfast Losartan Potassium (Cozaar) 100 mg :  take one tab at evening meal Cyclobenzaprine (Flexiril)  5 mg : take one tab at evening meal Atorvastatin (Lipitor) 80 mg : take one tab at evening meal Linzess 145 mcg Take one capsule before breakfast Xarelto 20 mg : take one tab at evening meal Dronedarone (Multaq) 400 mg : take one tab at lunch and one at evening meal Ezetimbe (Zetia) 10 mg : take one tab at breakfast Freestyle Libre Sensors  Alprazolam (Xanax) 0.25 mg : Take 1 tab 3 times daily as needed       Confirmed delivery date of 09/18/21, advised patient that pharmacy will contact them the morning of delivery.    Patient is due for next adherence delivery on: 10/17/21. Called patient and reviewed medications and coordinated delivery. Packs 30 DS  This delivery  to include: Hydrochlorothiazide 25 mg : take one tab at breakfast Losartan Potassium (Cozaar) 100 mg :  take one tab at evening meal Cyclobenzaprine (Flexiril)  5 mg : take one tab at evening meal Atorvastatin (Lipitor) 80 mg : take one tab at evening meal Linzess 145 mcg Take one capsule before breakfast Xarelto 20 mg : take one tab at evening meal Dronedarone (Multaq) 400 mg : take one tab at lunch and one at evening meal Ezetimbe (Zetia) 10 mg : take one tab at breakfast Freestyle Libre Sensors  Alprazolam  (Xanax) 0.25 mg : Take 1 tab 3 times daily as needed  Patient requested to add Docusate Sodium.  Confirmed delivery date of 10/17/21, advised patient that pharmacy will contact them the morning of delivery.   Care Gaps: Hepatitis C Screen - Overdue TDAP - Overdue Zoster Vaccine - Overdue Foot Exam - Overdue PNA Vaccine - Overdue Eye Exam - Postponed COVID Booster - Postponed BP- 130/70 09/18/21 AWV-Last 2021- office aware to schedule as of 9/22 CCM- 10/23 Lab Results  Component Value Date   HGBA1C 6.4 09/18/2021    Star Rating Drugs: Metformin (Glucophage XR) 500 mg - Last filled 02/17/2021 30 DS at [name] (Pt has been declining do to on hand supply) Losartan (Cozaar) 100 mg - Last filled 09/15/2021 30 DS at [name] Atorvastatin (Lipitor) 80 mg - Last filled 09/15/2021 30 DS at [facility] Pharmacist Assistant [phone]

## 2021-10-08 ENCOUNTER — Other Ambulatory Visit: Payer: Self-pay | Admitting: Internal Medicine

## 2021-10-08 DIAGNOSIS — F419 Anxiety disorder, unspecified: Secondary | ICD-10-CM

## 2021-10-08 NOTE — Progress Notes (Signed)
Cardiology Office Note:    Date:  10/14/2021   ID:  Miranda [name], DOB 16-Nov-1945, MRN [id number]  PCP:  [name], [name], MD   [facility] Providers Cardiologist:  Loreda Silverio [name], MD Electrophysiologist:  Will Meredith Leeds, MD     Referring MD: [name], [name]   Chief Complaint  Patient presents with   Atrial Fibrillation    History of Present Illness:    Miranda [name] is a 76 y.o. female with a hx of hypertension, hyperlipidemia, OSA on CPAP, DM 2, and atrial fibrillation.  Myoview in March 2016 showed EF 69%, normal perfusion and wall motion.  She was diagnosed with acute PE in February 2018 and was treated with 55-monthcourse of Xarelto.  High-resolution CT in February 2018 revealed interstitial lung disease as well as aortic atherosclerosis and two-vessel CAD.  Echocardiogram on 05/02/2016 showed EF 60 to 65%, grade 1 DD, moderate LAE.  Repeat CTA of chest in July 2019 showed resolution of PE.  She had atrial fibrillation while getting ready for colonoscopy in July 2019.  She spontaneously converted in the emergency room.  She was restarted on Xarelto and metoprolol.  EF was normal on echocardiogram.  Myoview was normal.  She was hospitalized in January 2023 with chest pain shortness of breath and found to have acute renal failure and felt to be dehydrated.  She was given IV fluid.  Echocardiogram was normal.  She also had hypokalemia and hypocalcemia.  She is currently on Multaq to 400 mg twice a day to help control PAF.  On follow up today she is doing well from a cardiac standpoint. She has rare palpitations if she over exerts. Is tolerating medication well. Main complaint if of constipation despite taking Linzess.    Past Medical History:  Diagnosis Date   Anxiety    Arthritis    Asthma    Atrial fibrillation (HCC)    CHF (congestive heart failure) ([facility]    pt. unsure- but thinks she was hosp. for CHF- 2002   Chronic kidney disease    recent  pyeloneph-[facility]  Clotting disorder ([facility]    blood clots in lungs/PE pulmonary embolism   Colon polyps    Complication of anesthesia    states requires a lot med. to put her to sleep    DDD (degenerative disc disease) 09/17/2011   Depression    "sometimes "   DM (diabetes mellitus) ([facility]    Family history of anesthesia complication    Sister had difficulty waking up from anesthesia   GERD (gastroesophageal reflux disease)    Glaucoma    bilateral, pt. admits that she is noncompliant to eye gtts.    Hemorrhoids    Hyperlipidemia    Hypertension    had stress, echo- 2006 /w [place], Cardiac Cath, per pt. 2002, echo repeated 2012- wnl    IPF (idiopathic pulmonary fibrosis) (HCC)    Low back pain    Shortness of breath    Sleep apnea    uses c-pap- q night recently    Past Surgical History:  Procedure Laterality Date   ABDOMINAL HYSTERECTOMY     ectopic, fibroids   ANTERIOR CERVICAL DECOMP/DISCECTOMY FUSION N/A 09/11/2020   Procedure: Anterior Cervical Decompression Fusion Cervical four-five, Cervical five-six;  Surgeon: TVallarie Mare MD;  [place]  Service: Neurosurgery;  Laterality: N/A;   arm surgery Right    CARDIAC CATHETERIZATION     CATARACT EXTRACTION, BILATERAL  cataracts removed bilateral- ?IOL   COLONOSCOPY     remote   FISSURECTOMY  10/08/2011   Procedure: FISSURECTOMY;  Surgeon: Stark Klein, MD;  Location: [place];  Service: General;  Laterality: N/A;   FLEXIBLE SIGMOIDOSCOPY  02/25/2011   Procedure: FLEXIBLE SIGMOIDOSCOPY;  Surgeon: Inda Castle, MD;  Location: [facility];  Service: Endoscopy;  Laterality: N/A;   FOOT SURGERY     bilat, heel spurs- screw in R foot    HEMORRHOID SURGERY  10/08/2011   Procedure: HEMORRHOIDECTOMY;  Surgeon: Stark Klein, MD;  Location: [place];  Service: General;  Laterality: N/A;  External    ROTATOR CUFF REPAIR Right    SPHINCTEROTOMY  10/08/2011   Procedure: Joan Mayans;  Surgeon: Stark Klein, MD;   Location: [place];  Service: General;  Laterality: N/A;    Current Medications: Current Meds  Medication Sig   Accu-Chek Softclix Lancets lancets Use as instructed   acetaminophen (TYLENOL) 325 MG tablet Take 650 mg by mouth every 6 (six) hours as needed for moderate pain.   albuterol (VENTOLIN HFA) 108 (90 Base) MCG/ACT inhaler Inhale 1 puff into the lungs every 6 (six) hours as needed.   ALPRAZolam (XANAX) 0.25 MG tablet TAKE ONE TABLET BY MOUTH three times daily AS NEEDED   atorvastatin (LIPITOR) 80 MG tablet TAKE ONE TABLET BY MOUTH EVERY EVENING   Continuous Blood Gluc Receiver (FREESTYLE LIBRE 2 READER) DEVI 1 each by Does not apply route daily.   Continuous Blood Gluc Sensor (FREESTYLE LIBRE 2 SENSOR) MISC 1 each by Does not apply route daily.   cyclobenzaprine (FLEXERIL) 5 MG tablet TAKE ONE TABLET BY MOUTH EVERY EVENING   dronedarone (MULTAQ) 400 MG tablet TAKE ONE TABLET BY MOUTH AT NOON and TAKE ONE TABLET BY MOUTH EVERY EVENING   ezetimibe (ZETIA) 10 MG tablet TAKE ONE TABLET BY MOUTH ONCE DAILY   hydrochlorothiazide (HYDRODIURIL) 25 MG tablet TAKE ONE TABLET BY MOUTH ONCE DAILY   HYDROcodone-acetaminophen (NORCO) 10-325 MG tablet Take 1 tablet by mouth every 6 (six) hours as needed for pain.   lactulose, encephalopathy, (ENULOSE) 10 GM/15ML SOLN take 87ms BY MOUTH three times daily AS NEEDED FOR mild constipation   LINZESS 145 MCG CAPS capsule TAKE ONE CAPSULE BY MOUTH BEFORE BREAKFAST   losartan (COZAAR) 100 MG tablet TAKE ONE TABLET BY MOUTH EVERY EVENING   metFORMIN (GLUCOPHAGE-XR) 500 MG 24 hr tablet TAKE ONE TABLET BY MOUTH EVERY MORNING   Pirfenidone 801 MG TABS Take 1 tablet by mouth 3 (three) times daily.   XARELTO 20 MG TABS tablet TAKE ONE TABLET BY MOUTH EVERY EVENING     Allergies:   Fish oil, Other, Penicillins, Pneumococcal vaccines, Cashew nut oil, Nitrofurantoin macrocrystal, Peanut oil, Aspirin, Bactrim [sulfamethoxazole-trimethoprim], Ciprofloxacin, Ibuprofen,  Influenza vaccines, Ivp dye [iodinated contrast media], Latex, Macrobid [nitrofurantoin monohydrate macrocrystals], and Shellfish allergy   Social History   Socioeconomic History   Marital status: Married    Spouse name: Not on file   Number of children: 4   Years of education: Not on file   Highest education level: Not on file  Occupational History   Occupation: retired CForensic psychologist UNEMPLOYED  Tobacco Use   Smoking status: Never    Passive exposure: Never   Smokeless tobacco: Never  Vaping Use   Vaping Use: Never used  Substance and Sexual Activity   Alcohol use: No    Alcohol/week: 0.0 standard drinks of alcohol   Drug use: No   Sexual activity:  Never  Other Topics Concern   Not on file  Social History Narrative   Not on file   Social Determinants of Health   Financial Resource Strain: Low Risk  (08/07/2019)   Overall Financial Resource Strain (CARDIA)    Difficulty of Paying Living Expenses: Not hard at all  Food Insecurity: No Food Insecurity (03/28/2019)   Hunger Vital Sign    Worried About Running Out of Food in the Last Year: Never true    Ran Out of Food in the Last Year: Never true  Transportation Needs: No Transportation Needs (08/07/2019)   PRAPARE - Hydrologist (Medical): No    Lack of Transportation (Non-Medical): No  Physical Activity: Not on file  Stress: Not on file  Social Connections: Not on file     Family History: The patient's family history includes Arthritis/Rheumatoid in her sister; Asthma in her sister; Breast cancer in her mother and sister; COPD in her sister; Colon cancer in her brother and brother; Emphysema in her sister; Heart attack in her mother; Heart disease in her mother; Lung cancer in her sister. There is no history of Anesthesia problems, Esophageal cancer, Rectal cancer, or Stomach cancer.  ROS:   Please see the history of present illness.     All other systems reviewed and are  negative.  EKGs/Labs/Other Studies Reviewed:    The following studies were reviewed today:  Echo 04/14/2021  1. Left ventricular ejection fraction, by estimation, is 55 to 60%. The  left ventricle has normal function. The left ventricle has no regional  wall motion abnormalities. Left ventricular diastolic parameters are  consistent with Grade I diastolic  dysfunction (impaired relaxation).   2. Right ventricular systolic function is normal. The right ventricular  size is normal. There is normal pulmonary artery systolic pressure.   3. The mitral valve is normal in structure. No evidence of mitral valve  regurgitation. No evidence of mitral stenosis.   4. The aortic valve is normal in structure. Aortic valve regurgitation is  not visualized. Aortic valve sclerosis/calcification is present, without  any evidence of aortic stenosis.   5. The inferior vena cava is normal in size with greater than 50%  respiratory variability, suggesting right atrial pressure of 3 mmHg.   Comparison(s): A prior study was performed on 10/19/2017. No significant  change from prior study.   EKG:  EKG is not ordered today.   Recent Labs: 04/14/2021: B Natriuretic Peptide 42.9; TSH 1.274 04/24/2021: Magnesium 2.1 09/18/2021: ALT 31; BUN 18; Creatinine, Ser 0.94; Hemoglobin 12.7; Platelets 188.0; Potassium 4.0; Sodium 134  Recent Lipid Panel    Component Value Date/Time   CHOL 130 04/14/2021 0600   CHOL 167 11/26/2017 0908   TRIG 72 04/14/2021 0600   HDL 60 04/14/2021 0600   HDL 61 11/26/2017 0908   CHOLHDL 2.2 04/14/2021 0600   VLDL 14 04/14/2021 0600   LDLCALC 56 04/14/2021 0600   LDLCALC 90 12/01/2019 1025   LDLDIRECT 152.8 03/30/2011 0859     Risk Assessment/Calculations:    CHA2DS2-VASc Score =     This indicates a  % annual risk of stroke. The patient's score is based upon:            Physical Exam:    VS:  BP 130/72   Pulse 60   Ht 5' 5.5" (1.664 m)   Wt 176 lb 12.8 oz (80.2 kg)    SpO2 97%   BMI 28.97 kg/m  Wt Readings from Last 3 Encounters:  10/14/21 176 lb 12.8 oz (80.2 kg)  09/18/21 175 lb 6.4 oz (79.6 kg)  05/14/21 165 lb 12.8 oz (75.2 kg)     GEN:  Well nourished, well developed in no acute distress HEENT: Normal NECK: No JVD; No carotid bruits LYMPHATICS: No lymphadenopathy CARDIAC: RRR, no murmurs, rubs, gallops RESPIRATORY:  Clear to auscultation without rales, wheezing or rhonchi  ABDOMEN: Soft, non-tender, non-distended MUSCULOSKELETAL:  No edema; No deformity  SKIN: Warm and dry NEUROLOGIC:  Alert and oriented x 3 PSYCHIATRIC:  Normal affect   ASSESSMENT:    1. PAF (paroxysmal atrial fibrillation) ([place])   2. Primary hypertension   3. Hyperlipidemia LDL goal <70   4. Coronary artery disease involving native coronary artery of native heart without angina pectoris     PLAN:    In order of problems listed above:  PAF: well controlled on Multaq. On Xarelto for anticoagulation.  Told her to be sure and take Multaq with food. Follow up in 6 months with me or APP  Hypertension: Blood pressure controlled on current therapy  Hyperlipidemia: On Zetia. Labs OK  DM2: Managed by primary care provider  5.   Constipation. Recommend she follow up with GI.            Medication Adjustments/Labs and Tests Ordered: Current medicines are reviewed at length with the patient today.  Concerns regarding medicines are outlined above.  No orders of the defined types were placed in this encounter.  No orders of the defined types were placed in this encounter.   There are no Patient Instructions on file for this visit.   Signed, Sallee Hogrefe [name], MD  10/14/2021 9:33 AM    [facility]

## 2021-10-08 NOTE — Telephone Encounter (Signed)
Last refill 09/09/21 Last office visit 09/18/21

## 2021-10-14 ENCOUNTER — Encounter: Payer: Self-pay | Admitting: Cardiology

## 2021-10-14 ENCOUNTER — Ambulatory Visit: Payer: HMO | Admitting: Cardiology

## 2021-10-14 VITALS — BP 130/72 | HR 60 | Ht 65.5 in | Wt 176.8 lb

## 2021-10-14 DIAGNOSIS — I480 Paroxysmal atrial fibrillation: Secondary | ICD-10-CM

## 2021-10-14 DIAGNOSIS — E785 Hyperlipidemia, unspecified: Secondary | ICD-10-CM | POA: Diagnosis not present

## 2021-10-14 DIAGNOSIS — I10 Essential (primary) hypertension: Secondary | ICD-10-CM | POA: Diagnosis not present

## 2021-10-14 DIAGNOSIS — I2510 Atherosclerotic heart disease of native coronary artery without angina pectoris: Secondary | ICD-10-CM | POA: Diagnosis not present

## 2021-10-24 ENCOUNTER — Ambulatory Visit
Admission: RE | Admit: 2021-10-24 | Discharge: 2021-10-24 | Disposition: A | Discharge status: Home / Self Care | Payer: HMO | Source: Ambulatory Visit | Attending: Pulmonary Disease | Admitting: Pulmonary Disease

## 2021-10-24 DIAGNOSIS — J479 Bronchiectasis, uncomplicated: Secondary | ICD-10-CM | POA: Diagnosis not present

## 2021-10-24 DIAGNOSIS — I2510 Atherosclerotic heart disease of native coronary artery without angina pectoris: Secondary | ICD-10-CM | POA: Diagnosis not present

## 2021-10-24 DIAGNOSIS — I358 Other nonrheumatic aortic valve disorders: Secondary | ICD-10-CM | POA: Diagnosis not present

## 2021-10-24 DIAGNOSIS — J84112 Idiopathic pulmonary fibrosis: Secondary | ICD-10-CM

## 2021-10-24 DIAGNOSIS — J849 Interstitial pulmonary disease, unspecified: Secondary | ICD-10-CM | POA: Diagnosis not present

## 2021-11-05 ENCOUNTER — Other Ambulatory Visit: Payer: Self-pay | Admitting: Internal Medicine

## 2021-11-05 DIAGNOSIS — I480 Paroxysmal atrial fibrillation: Secondary | ICD-10-CM

## 2021-11-07 DIAGNOSIS — E119 Type 2 diabetes mellitus without complications: Secondary | ICD-10-CM | POA: Diagnosis not present

## 2021-11-07 DIAGNOSIS — Z7901 Long term (current) use of anticoagulants: Secondary | ICD-10-CM | POA: Diagnosis not present

## 2021-11-07 DIAGNOSIS — Z7984 Long term (current) use of oral hypoglycemic drugs: Secondary | ICD-10-CM | POA: Diagnosis not present

## 2021-11-07 DIAGNOSIS — I480 Paroxysmal atrial fibrillation: Secondary | ICD-10-CM | POA: Diagnosis not present

## 2021-11-07 DIAGNOSIS — I10 Essential (primary) hypertension: Secondary | ICD-10-CM | POA: Diagnosis not present

## 2021-11-10 ENCOUNTER — Telehealth: Payer: Self-pay | Admitting: Pharmacist

## 2021-11-10 NOTE — Chronic Care Management (AMB) (Signed)
Chronic Care Management Pharmacy Assistant   Name: Miranda [name]  MRN: [id number] DOB: 10/20/1945  Reason for Encounter: Medication Review Medication Coordination   Conditions to be addressed/monitored: HTN  Recent office visits:  10/14/21 [name], [name], MD (Cardiology) - Patient presented for PAF and other concerns. No medication changes.  Recent consult visits:  None   Hospital visits:  None in previous 6 months  Medications: Outpatient Encounter Medications as of 11/10/2021  Medication Sig   Accu-Chek Softclix Lancets lancets Use as instructed   acetaminophen (TYLENOL) 325 MG tablet Take 650 mg by mouth every 6 (six) hours as needed for moderate pain.   albuterol (VENTOLIN HFA) 108 (90 Base) MCG/ACT inhaler Inhale 1 puff into the lungs every 6 (six) hours as needed.   ALPRAZolam (XANAX) 0.25 MG tablet TAKE ONE TABLET BY MOUTH three times daily AS NEEDED   atorvastatin (LIPITOR) 80 MG tablet TAKE ONE TABLET BY MOUTH EVERY EVENING   Continuous Blood Gluc Receiver (FREESTYLE LIBRE 2 READER) DEVI 1 each by Does not apply route daily.   Continuous Blood Gluc Sensor (FREESTYLE LIBRE 2 SENSOR) MISC 1 each by Does not apply route daily.   cyclobenzaprine (FLEXERIL) 5 MG tablet TAKE ONE TABLET BY MOUTH EVERY EVENING   dronedarone (MULTAQ) 400 MG tablet TAKE ONE TABLET BY MOUTH AT NOON and TAKE ONE TABLET BY MOUTH EVERY EVENING   ezetimibe (ZETIA) 10 MG tablet TAKE ONE TABLET BY MOUTH ONCE DAILY   hydrochlorothiazide (HYDRODIURIL) 25 MG tablet TAKE ONE TABLET BY MOUTH ONCE DAILY   HYDROcodone-acetaminophen (NORCO) 10-325 MG tablet Take 1 tablet by mouth every 6 (six) hours as needed for pain.   lactulose, encephalopathy, (ENULOSE) 10 GM/15ML SOLN take 49ms BY MOUTH three times daily AS NEEDED FOR mild constipation   LINZESS 145 MCG CAPS capsule TAKE ONE CAPSULE BY MOUTH BEFORE BREAKFAST   losartan (COZAAR) 100 MG tablet TAKE ONE TABLET BY MOUTH EVERY EVENING   metFORMIN  (GLUCOPHAGE-XR) 500 MG 24 hr tablet TAKE ONE TABLET BY MOUTH EVERY MORNING   Pirfenidone 801 MG TABS Take 1 tablet by mouth 3 (three) times daily.   XARELTO 20 MG TABS tablet TAKE ONE TABLET BY MOUTH EVERY EVENING   No facility-administered encounter medications on file as of 11/10/2021.  Reviewed chart prior to disease state call. Spoke with patient regarding BP  Recent Office Vitals: BP Readings from Last 3 Encounters:  10/14/21 130/72  09/18/21 130/70  05/14/21 140/70   Pulse Readings from Last 3 Encounters:  10/14/21 60  09/18/21 60  05/14/21 (!) 56    Wt Readings from Last 3 Encounters:  10/14/21 176 lb 12.8 oz (80.2 kg)  09/18/21 175 lb 6.4 oz (79.6 kg)  05/14/21 165 lb 12.8 oz (75.2 kg)     Kidney Function Lab Results  Component Value Date/Time   CREATININE 0.94 09/18/2021 10:29 AM   CREATININE 0.88 04/24/2021 03:01 PM   CREATININE 0.93 12/01/2019 10:25 AM   GFR 59.58 (L) 09/18/2021 10:29 AM   GFRNONAA 58 (L) 04/17/2021 06:15 AM   GFRAA >60 11/04/2017 04:10 AM       Latest Ref Rng & Units 09/18/2021   10:29 AM 04/24/2021    3:01 PM 04/17/2021    6:15 AM  BMP  Glucose 70 - 99 mg/dL 62  76  89   BUN 6 - 23 mg/dL '18  9  14   '$ Creatinine 0.40 - 1.20 mg/dL 0.94  0.88  1.01   Sodium 135 -  145 mEq/L 134  140  140   Potassium 3.5 - 5.1 mEq/L 4.0  4.3  3.6   Chloride 96 - 112 mEq/L 101  107  105   CO2 19 - 32 mEq/L '31  26  28   '$ Calcium 8.4 - 10.5 mg/dL 10.4  9.4  8.9     Current antihypertensive regimen:  HCTZ 25 mg 1 tablet daily - Appropriate, Query effective, Safe, Accessible Losartan 100 mg 1 tablet daily - Appropriate, Query effective, Safe, Accessible How often are you checking your Blood Pressure? infrequently Current home BP readings:  BP Readings from Last 3 Encounters:  10/14/21 130/72  09/18/21 130/70  05/14/21 140/70   What recent interventions/DTPs have been made by any provider to improve Blood Pressure control since last CPP Visit: Patient reports  none Any recent hospitalizations or ED visits since last visit with CPP? No  Adherence Review: Is the patient currently on ACE/ARB medication? Yes Does the patient have >5 day gap between last estimated fill dates? No  Reviewed chart for medication changes ahead of medication coordination call.  BP Readings from Last 3 Encounters:  10/14/21 130/72  09/18/21 130/70  05/14/21 140/70    Lab Results  Component Value Date   HGBA1C 6.4 09/18/2021     Patient obtains medications through Adherence Packaging  30 Days   Last adherence delivery included:  Hydrochlorothiazide 25 mg : take one tab at breakfast Losartan Potassium (Cozaar) 100 mg :  take one tab at evening meal Cyclobenzaprine (Flexiril)  5 mg : take one tab at evening meal Atorvastatin (Lipitor) 80 mg : take one tab at evening meal Linzess 145 mcg Take one capsule before breakfast Xarelto 20 mg : take one tab at evening meal Dronedarone (Multaq) 400 mg : take one tab at lunch and one at evening meal Ezetimbe (Zetia) 10 mg : take one tab at breakfast Freestyle Libre Sensors  Alprazolam (Xanax) 0.25 mg : Take 1 tab 3 times daily as needed   Patient requested to add Docusate Sodium.   Confirmed delivery date of 10/17/21, advised patient that pharmacy will contact them the morning of delivery.    Patient is due for next adherence delivery on: 11/18/21. Called patient and reviewed medications and coordinated delivery. Packs 30 DS  This delivery to include: Hydrochlorothiazide 25 mg : take one tab at breakfast Losartan Potassium (Cozaar) 100 mg :  take one tab at evening meal Cyclobenzaprine (Flexiril)  5 mg : take one tab at evening meal Atorvastatin (Lipitor) 80 mg : take one tab at evening meal Linzess 145 mcg Take one capsule before breakfast Xarelto 20 mg : take one tab at evening meal Dronedarone (Multaq) 400 mg : take one tab at lunch and one at evening meal Ezetimbe (Zetia) 10 mg : take one tab at  breakfast Freestyle Libre Sensors  Alprazolam (Xanax) 0.25 mg : Take 1 tab 3 times daily as needed  HYDROCODONE/ACETAMINOPHEN 10-325 T 10/22/2021 15 60 each     Coordinated acute fill for Sensors to be delivered on 11/10/21 Patient reports she is out as she had to remove them for some testing she had done recently. She reports her husband passed on last week so she had forgotten to call and have them sent out to her.  Patient reports she declines needing this month: Docusate Sodium 100 mg: 1 Capsule twice daily as needed  Confirmed delivery date of 11/18/21, advised patient that pharmacy will contact them the morning of delivery.  Care Gaps: Eye Exam - Overdue Hepatitis C Screening - Overdue TDAP - Overdue Zoster Vaccine - Overdue Foot Exam - Overdue PNA Vaccine - Overdue COVID Booster - Overdue HGB A1C - Overdue Colonoscopy - Overdue BP- 130/72 10/14/21 AWV- Last 2021- office aware to schedule as of 9/22 CCM- 10/23 Lab Results  Component Value Date   HGBA1C 6.4 09/18/2021    Star Rating Drugs: Metformin (Glucophage XR) 500 mg - Last filled 02/17/2021 30 DS at [name] (Pt has been declining do to on hand supply) Losartan (Cozaar) 100 mg - Last filled 10/10/2021 30 DS at [name] Atorvastatin (Lipitor) 80 mg - Last filled 10/10/2021 30 DS at [facility] Pharmacist Assistant [phone]

## 2021-11-12 ENCOUNTER — Encounter: Payer: Self-pay | Admitting: Pulmonary Disease

## 2021-11-12 ENCOUNTER — Ambulatory Visit (INDEPENDENT_AMBULATORY_CARE_PROVIDER_SITE_OTHER): Payer: HMO | Admitting: Pulmonary Disease

## 2021-11-12 ENCOUNTER — Ambulatory Visit: Payer: HMO | Admitting: Pulmonary Disease

## 2021-11-12 VITALS — BP 138/62 | HR 58 | Temp 97.6°F | Ht 66.0 in | Wt 176.0 lb

## 2021-11-12 DIAGNOSIS — G4733 Obstructive sleep apnea (adult) (pediatric): Secondary | ICD-10-CM

## 2021-11-12 DIAGNOSIS — Z5181 Encounter for therapeutic drug level monitoring: Secondary | ICD-10-CM | POA: Diagnosis not present

## 2021-11-12 DIAGNOSIS — J84112 Idiopathic pulmonary fibrosis: Secondary | ICD-10-CM | POA: Diagnosis not present

## 2021-11-12 LAB — PULMONARY FUNCTION TEST
DL/VA % pred: 107 %
DL/VA: 4.33 ml/min/mmHg/L
DLCO cor % pred: 81 %
DLCO cor: 16.71 ml/min/mmHg
DLCO unc % pred: 81 %
DLCO unc: 16.71 ml/min/mmHg
FEF 25-75 Post: 2.43 L/sec
FEF 25-75 Pre: 2 L/sec
FEF2575-%Change-Post: 21 %
FEF2575-%Pred-Post: 137 %
FEF2575-%Pred-Pre: 112 %
FEV1-%Change-Post: 7 %
FEV1-%Pred-Post: 82 %
FEV1-%Pred-Pre: 77 %
FEV1-Post: 1.91 L
FEV1-Pre: 1.78 L
FEV1FVC-%Change-Post: 3 %
FEV1FVC-%Pred-Pre: 109 %
FEV6-%Change-Post: 3 %
FEV6-%Pred-Post: 77 %
FEV6-%Pred-Pre: 74 %
FEV6-Post: 2.25 L
FEV6-Pre: 2.17 L
FEV6FVC-%Change-Post: 0 %
FEV6FVC-%Pred-Post: 104 %
FEV6FVC-%Pred-Pre: 104 %
FVC-%Change-Post: 3 %
FVC-%Pred-Post: 73 %
FVC-%Pred-Pre: 71 %
FVC-Post: 2.25 L
FVC-Pre: 2.17 L
Post FEV1/FVC ratio: 85 %
Post FEV6/FVC ratio: 100 %
Pre FEV1/FVC ratio: 82 %
Pre FEV6/FVC Ratio: 100 %
RV % pred: 58 %
RV: 1.39 L
TLC % pred: 70 %
TLC: 3.79 L

## 2021-11-12 NOTE — Patient Instructions (Signed)
I am glad you are doing well with regard to your breathing Please call the DME company to get the CPAP replaced Your lung function test and CT shows stable lung scarring which is good news Continue the Esbriet medication Follow-up in 6 months with me with either in person or video visit.

## 2021-11-12 NOTE — Patient Instructions (Signed)
Full PFT performed today. °

## 2021-11-12 NOTE — Progress Notes (Addendum)
Miranda [name]    [id number]    08-22-1945  Primary Care Physician:Miranda Everardo Beals, MD  Referring Physician: Isaac [name], Miranda Halsted, MD [address] [place],  [place] [address]  Chief complaint: Follow-up for interstitial lung disease, OSA  HPI: Miranda [name] is here for follow-up of obstructive sleep apnea, pulmonary fibrosis. PE in 2018 opn anticoagulation, proxysmal atrial fib She has history of UIP fibrosis.  There is history of rheumatoid arthritis in chart but connective tissue serologies are negative with no arthritis, joint pain symptoms.  Pulmonary fibrosis is felt to be more consistent with IPF and she has been on Esbriet since 2019  She was hospitalized in early February 2023 for dehydration, AKI and rapid atrial fibrillation.  Overall improved since discharge  Pets: Has a dog.  No cats, birds, farm animals Occupation: Retired Quarry manager Exposures: Has feather pillows at home but not in her bedroom.  No mold, hot tub, Jacuzzi Smoking history: Never smoker Travel history: No significant travel history Relevant family history: No significant family history of lung disease  Interim history: Continues on Esbriet without issue She is here for review of CT and PFTs  Outpatient Encounter Medications as of 11/12/2021  Medication Sig   Accu-Chek Softclix Lancets lancets Use as instructed   acetaminophen (TYLENOL) 325 MG tablet Take 650 mg by mouth every 6 (six) hours as needed for moderate pain.   albuterol (VENTOLIN HFA) 108 (90 Base) MCG/ACT inhaler Inhale 1 puff into the lungs every 6 (six) hours as needed.   ALPRAZolam (XANAX) 0.25 MG tablet TAKE ONE TABLET BY MOUTH three times daily AS NEEDED   atorvastatin (LIPITOR) 80 MG tablet TAKE ONE TABLET BY MOUTH EVERY EVENING   Continuous Blood Gluc Receiver (FREESTYLE LIBRE 2 READER) DEVI 1 each by Does not apply route daily.   Continuous Blood Gluc Sensor (FREESTYLE LIBRE 2 SENSOR) MISC 1 each by Does not  apply route daily.   cyclobenzaprine (FLEXERIL) 5 MG tablet TAKE ONE TABLET BY MOUTH EVERY EVENING   dronedarone (MULTAQ) 400 MG tablet TAKE ONE TABLET BY MOUTH AT NOON and TAKE ONE TABLET BY MOUTH EVERY EVENING   ezetimibe (ZETIA) 10 MG tablet TAKE ONE TABLET BY MOUTH ONCE DAILY   hydrochlorothiazide (HYDRODIURIL) 25 MG tablet TAKE ONE TABLET BY MOUTH ONCE DAILY   HYDROcodone-acetaminophen (NORCO) 10-325 MG tablet Take 1 tablet by mouth every 6 (six) hours as needed for pain.   lactulose, encephalopathy, (ENULOSE) 10 GM/15ML SOLN take 2ms BY MOUTH three times daily AS NEEDED FOR mild constipation   LINZESS 145 MCG CAPS capsule TAKE ONE CAPSULE BY MOUTH BEFORE BREAKFAST   losartan (COZAAR) 100 MG tablet TAKE ONE TABLET BY MOUTH EVERY EVENING   metFORMIN (GLUCOPHAGE-XR) 500 MG 24 hr tablet TAKE ONE TABLET BY MOUTH EVERY MORNING   Pirfenidone 801 MG TABS Take 1 tablet by mouth 3 (three) times daily.   XARELTO 20 MG TABS tablet TAKE ONE TABLET BY MOUTH EVERY EVENING   No facility-administered encounter medications on file as of 11/12/2021.   Physical Exam: Blood pressure 138/62, pulse (!) 58, temperature 97.6 F (36.4 C), temperature source Oral, height '5\' 6"'$  (1.676 m), weight 176 lb (79.8 kg), SpO2 98 %. Gen:      No acute distress HEENT:  EOMI, sclera anicteric Neck:     No masses; no thyromegaly Lungs:    Bibasal crackles CV:         Regular rate and rhythm;  no murmurs Abd:      + bowel sounds; soft, non-tender; no palpable masses, no distension Ext:    No edema; adequate peripheral perfusion Skin:      Warm and dry; no rash Neuro: alert and oriented x 3 Psych: normal mood and affect   Data Reviewed: Imaging: High-res CT 12/09/2018- mild pulmonary fibrosis and basilar predominance.  Traction bronchiectasis, honeycombing.  Unchanged compared to 2017  High-res CT 12/15/2019-unchanged UIP pulmonary fibrosis  High-res CT 10/24/2021-stable pattern of UIP pulmonary fibrosis I have  reviewed all images personally  PFTs: 06/14/2017 FVC 2.21 [89%], FEV1 1.88 [98%],/55, TLC 3.68 [70%], DLCO 14.69 [57%]   10/15/2019 FVC 2.10 [83%), FEV1 1.42 [73%], F/F 68, TLC 3.77 70%], DLCO 16.51 [80%]  11/12/2021 FVC 2.25 [73%], FEV1 1.91 [82%], F/F 85, TLC 3.79 [70%], DLCO 16.71 [81%] Mild restriction  Labs: CTD serologies including CCP, rheumatoid factor 2017, 2018, 2019-negative Comprehensive metabolic panel 5/46/5035- normal.  Sleep study: 10/2016 PSG> AHI 10.5   CPAP download 12/19/2018-77% compliance.  Residual AHI 2  Cardiac: Echocardiogram 10/19/2017-LVEF 60 to 46%, grade 1 diastolic dysfunction, mild RV dilatation.  Assessment:  Pulmonary fibrosis Although she has history of rheumatoid arthritis in the chart she does not have any symptoms.  And has negative serologies.  Presentation is consistent with IPF Doing well with Esbriet.  CT and PFTs reviewed with stable interstitial lung disease which is good news  Hepatic panel in July 2023 reviewed and are normal  Evaluation for pulmonary hypertension Does not have lower extremity edema.  Last echocardiogram notes mild RV dilatation though no mention of increased pressures. proBNP is normal  Obstructive sleep apnea Having issues with CPAP and she will contact DME company to get a replacement She has been using her CPAP regularly and was benefiting from treatment before the malfunction of equipment.  Remote pulmonary embolism Continue anticoagulation  Health maintenance Has been vaccinated against Covid  Plan/Recommendations: -Continue Esbriet, CPAP  Miranda Garfinkel MD [facility] 11/12/2021, 1:27 PM  CC: Miranda [name], Miranda [name]*

## 2021-11-12 NOTE — Progress Notes (Signed)
Full PFT performed today. °

## 2021-11-21 ENCOUNTER — Telehealth: Payer: Self-pay | Admitting: Pulmonary Disease

## 2021-11-24 ENCOUNTER — Telehealth: Payer: Self-pay | Admitting: Pulmonary Disease

## 2021-11-24 ENCOUNTER — Other Ambulatory Visit: Payer: Self-pay | Admitting: Pulmonary Disease

## 2021-11-24 DIAGNOSIS — J84112 Idiopathic pulmonary fibrosis: Secondary | ICD-10-CM

## 2021-11-24 NOTE — Telephone Encounter (Signed)
Assessment:  Pulmonary fibrosis Although she has history of rheumatoid arthritis in the chart she does not have any symptoms.  And has negative serologies.  Presentation is consistent with IPF Doing well with Esbriet.  CT and PFTs reviewed with stable interstitial lung disease which is good news   Hepatic panel in July 2023 reviewed and are normal   Evaluation for pulmonary hypertension Does not have lower extremity edema.  Last echocardiogram notes mild RV dilatation though no mention of increased pressures. proBNP is normal   Obstructive sleep apnea Having issues with CPAP and she will contact DME company to get a replacement Encouraged her to use CPAP every day without fail as she had been noncompliant in the past    Have sent a community message to [place] with the info stated by Dr. Vaughan Browner about pt having issues with her cpap. Will await a response from him to see what he says back and then will go from there to see what needs to be done.

## 2021-11-24 NOTE — Telephone Encounter (Signed)
Patient states her insurance, [employer], sent papers to fill out to approve machine. Patient gave phone number of [phone] and [phone] for HTA for [place] to call.  Please call patient back at [phone].

## 2021-11-24 NOTE — Telephone Encounter (Signed)
Refill sent for PIRFENIDONE to [facility] (pulmonary fibrosis team): [phone]  Dose: 801 mg three times daily  Last OV: 11/12/21 Provider: Dr. Vaughan Browner  Next OV: not yet scheduled but should be in Feb 2024  CMET on 09/18/21 wnl  Knox Saliva, PharmD, MPH, BCPS Clinical Pharmacist (Rheumatology and Pulmonology)

## 2021-11-24 NOTE — Telephone Encounter (Signed)
Patient states [employer] sent paperwork back to our office to complete for CPAP machine, because patient was not qualified.  Please advise- call patient back at [phone].

## 2021-11-24 NOTE — Telephone Encounter (Signed)
Miranda [name],  Patient called to make [place] aware that her insurance is sending paperwork over to have PM fill out in regards to her CPAP machine. Please be on the look out for paperwork   Please and thank you

## 2021-11-25 NOTE — Telephone Encounter (Signed)
Message received from [place], [place].  Brad stated LOV note by Dr.Mannam needs addendum stating patient benefited from cpap use. Will close this message.  Previous encounter 11/25/21 addressing addendum was routed to Dr. Vaughan Browner.

## 2021-11-25 NOTE — Telephone Encounter (Signed)
No paperwork on patient has been received from [employer] or insurance company.  Insurance with cpap machines are handled through DME.  I called [employer] and was transferred to a provider line. I was placed on hold and no one answered.  I called patient to make her aware. Patient stated [employer] was sending paperwork to be completed by Dr. Vaughan Browner.  Patient stated she spoke with insurance and answered additional questions they needed. I sent Brad, [employer] a community message on cpap replacement.  I will follow up.

## 2021-11-25 NOTE — Telephone Encounter (Signed)
Sent community message to [place] with [employer] letting him know that pt had been having issues with cpap and that was why we were trying to get a replacement machine for pt. Message below is what was sent back to me from [place]:  [place], [employer], Waldemar Dickens, [place]; Reola Calkins Remus Blake, [facility],   We need the notes to show usage and benefit before insurance will process. Note and be updated to say patient was using and benefitting form pap therapy before machine malfunction.   That should take care of it.      Routing this to Dr. Vaughan Browner. Please advise if you are able to do an addendum on OV to state this so that way they can get machine taken care of for pt.

## 2021-11-26 ENCOUNTER — Other Ambulatory Visit (INDEPENDENT_AMBULATORY_CARE_PROVIDER_SITE_OTHER): Payer: HMO

## 2021-11-26 ENCOUNTER — Ambulatory Visit: Payer: HMO | Admitting: Gastroenterology

## 2021-11-26 ENCOUNTER — Encounter: Payer: Self-pay | Admitting: Gastroenterology

## 2021-11-26 ENCOUNTER — Telehealth: Payer: Self-pay | Admitting: Internal Medicine

## 2021-11-26 VITALS — BP 140/64 | HR 55 | Ht 66.0 in | Wt 180.0 lb

## 2021-11-26 DIAGNOSIS — K5909 Other constipation: Secondary | ICD-10-CM

## 2021-11-26 DIAGNOSIS — R1030 Lower abdominal pain, unspecified: Secondary | ICD-10-CM

## 2021-11-26 LAB — CREATININE, SERUM: Creatinine, Ser: 0.93 mg/dL (ref 0.40–1.20)

## 2021-11-26 LAB — BUN: BUN: 25 mg/dL — ABNORMAL HIGH (ref 6–23)

## 2021-11-26 MED ORDER — FREESTYLE LIBRE 2 SENSOR MISC: 1.0000 | Freq: Every day | 6 refills | Status: DC

## 2021-11-26 NOTE — Telephone Encounter (Signed)
Pt requesting refill of Continuous Blood Gluc Sensor (FREESTYLE LIBRE 2 SENSOR) MISC she is totally out.  [name] - [place], [place] - [address] Phone:  [phone]  Fax:  [phone]

## 2021-11-26 NOTE — Progress Notes (Signed)
[facility] Progress Note  Chief Complaint: Chronic constipation  Subjective  History: Miranda [name] was referred back to see me for abdominal pain.  I saw her in 2019 and again in June 2022 for history of lower abdominal pain with constipation and straining for BMs.  She was on MiraLAX and prune juice, and Linzess was added at the last office visit.  Screening colonoscopy for family history of colon cancer September 2022 with the following findings: The perianal and digital rectal examinations were normal. - Many diverticula were found in the left colon and right colon. - Four sessile polyps were found in the transverse colon, ascending colon and cecum. The polyps were diminutive in size. These polyps were removed with a cold snare. Resection and retrieval were complete. - A 10 mm polyp was found in the transverse colon. The polyp was sessile. The polyp was removed with a cold snare. Resection and retrieval were complete. - The exam was otherwise without abnormality on direct and retroflexion views.  _________________________  Miranda [name] is here for reevaluation of abdominal pain and constipation.  Symptoms are similar to before with lower abdominal pain that she sometimes feels in her lower back with straining for bowel movements.  She is still on Linzess and says the stool is soft, but when she feels the urge for BM, it is difficult to get it started.  She has to sit for long period of time and strain before something passes, and then she is able to get relief with passage of soft stool.  She believes this is also causing worsening of hemorrhoids with occasional bleeding.  ROS: Cardiovascular:  no chest pain Respiratory: no dyspnea Episodic urinary incontinence Remainder systems negative except as above The patient's Past Medical, Family and Social History were reviewed and are on file in the EMR.  Objective:  Med list reviewed  Current Outpatient Medications:    Accu-Chek  Softclix Lancets lancets, Use as instructed, Disp: 100 each, Rfl: 12   acetaminophen (TYLENOL) 325 MG tablet, Take 650 mg by mouth every 6 (six) hours as needed for moderate pain., Disp: , Rfl:    albuterol (VENTOLIN HFA) 108 (90 Base) MCG/ACT inhaler, Inhale 1 puff into the lungs every 6 (six) hours as needed., Disp: 18 g, Rfl: 1   ALPRAZolam (XANAX) 0.25 MG tablet, TAKE ONE TABLET BY MOUTH three times daily AS NEEDED, Disp: 90 tablet, Rfl: 2   atorvastatin (LIPITOR) 80 MG tablet, TAKE ONE TABLET BY MOUTH EVERY EVENING, Disp: 90 tablet, Rfl: 1   Continuous Blood Gluc Receiver (FREESTYLE LIBRE 2 READER) DEVI, 1 each by Does not apply route daily., Disp: 2 each, Rfl: 6   cyclobenzaprine (FLEXERIL) 5 MG tablet, TAKE ONE TABLET BY MOUTH EVERY EVENING, Disp: 90 tablet, Rfl: 1   dronedarone (MULTAQ) 400 MG tablet, TAKE ONE TABLET BY MOUTH AT NOON and TAKE ONE TABLET BY MOUTH EVERY EVENING, Disp: 180 tablet, Rfl: 2   ezetimibe (ZETIA) 10 MG tablet, TAKE ONE TABLET BY MOUTH ONCE DAILY, Disp: 90 tablet, Rfl: 1   hydrochlorothiazide (HYDRODIURIL) 25 MG tablet, TAKE ONE TABLET BY MOUTH ONCE DAILY, Disp: 90 tablet, Rfl: 1   HYDROcodone-acetaminophen (NORCO) 10-325 MG tablet, Take 1 tablet by mouth every 6 (six) hours as needed for pain., Disp: , Rfl:    lactulose, encephalopathy, (ENULOSE) 10 GM/15ML SOLN, take 31ms BY MOUTH three times daily AS NEEDED FOR mild constipation, Disp: 473 mL, Rfl: 1   LINZESS 145 MCG CAPS capsule, TAKE ONE CAPSULE  BY MOUTH BEFORE BREAKFAST, Disp: 30 capsule, Rfl: 2   losartan (COZAAR) 100 MG tablet, TAKE ONE TABLET BY MOUTH EVERY EVENING, Disp: 90 tablet, Rfl: 1   metFORMIN (GLUCOPHAGE-XR) 500 MG 24 hr tablet, TAKE ONE TABLET BY MOUTH EVERY MORNING, Disp: 90 tablet, Rfl: 0   Pirfenidone 801 MG TABS, TAKE 1 TABLET BY MOUTH 3 TIMES A DAY, Disp: 180 tablet, Rfl: 0   XARELTO 20 MG TABS tablet, TAKE ONE TABLET BY MOUTH EVERY EVENING, Disp: 90 tablet, Rfl: 1   Continuous Blood Gluc  Sensor (FREESTYLE LIBRE 2 SENSOR) MISC, 1 each by Does not apply route daily., Disp: 2 each, Rfl: 6   Vital signs in last 24 hrs: Vitals:   11/26/21 1406  BP: (!) 140/64  Pulse: (!) 55   Wt Readings from Last 3 Encounters:  11/26/21 180 lb (81.6 kg)  11/12/21 176 lb (79.8 kg)  10/14/21 176 lb 12.8 oz (80.2 kg)    Physical Exam  Well-appearing HEENT: sclera anicteric, oral mucosa moist without lesions Neck: supple, no thyromegaly, JVD or lymphadenopathy Cardiac: Soft systolic murmur, regular,  no peripheral edema Pulm: clear to auscultation bilaterally, normal RR and effort noted Abdomen: soft, mild left-sided tenderness, with active bowel sounds. No guarding or palpable hepatosplenomegaly. Skin; warm and dry, no jaundice or rash Rectal: Normal perianal exam.  No fissure or tenderness, no palpable internal lesion.  Decreased resting and mildly decreased voluntary sphincter tone.  Rectal descent felt while bearing down. Labs:   ___________________________________________ Radiologic studies:   ____________________________________________ Other: 1. Surgical [P], colon, cecum, ascending, and transverse, polyp (4) - TUBULAR ADENOMA(S) - NEGATIVE FOR HIGH-GRADE DYSPLASIA OR MALIGNANCY 2. Surgical [P], colon, transverse, polyp (1) - TUBULAR ADENOMA(S) - NEGATIVE FOR HIGH-GRADE DYSPLASIA OR MALIGNANCY  (3-year colonoscopy recall recommended)  _____________________________________________ Assessment & Plan  Assessment: Encounter Diagnoses  Name Primary?   Chronic constipation Yes   Lower abdominal pain    I believe her constipation is still related to slow colonic transit with diverticulosis and perhaps an element of pelvic floor dysfunction.  She has urinary symptoms as well, which may also be from descent of pelvic structures and pelvic floor dysfunction.  Unknown if she has a history of rectocele, though she certainly describes symptoms that sound like an outlet  obstruction.   Plan: CT abdomen and pelvis with oral and IV contrast to rule out other causes of lower abdominal pain and constipation.    BUN and creatinine today prior to IV contrast. No current need for repeat colonoscopy. Continue Linzess at current dose I will message her primary care provider and see if they may know when this patient was last seen by gynecology or who that provider was.  Ms. Funderburke cannot recall the last time she saw a gynecologist and was not even sure what type of physician that was. If her CT is unrevealing, a gynecologic pelvic exam to rule out rectocele would be helpful for her. 35 minutes were spent on this encounter (including chart review, history/exam, counseling/coordination of care, and documentation) > 50% of that time was spent on counseling and coordination of care.   Miranda [name] III

## 2021-11-26 NOTE — Patient Instructions (Signed)
_______________________________________________________  If you are age 75 or older, your body mass index should be between 23-30. Your Body mass index is 29.05 kg/m. If this is out of the aforementioned range listed, please consider follow up with your Primary Care Provider.  If you are age 75 or younger, your body mass index should be between 19-25. Your Body mass index is 29.05 kg/m. If this is out of the aformentioned range listed, please consider follow up with your Primary Care Provider.   ________________________________________________________  The [facility] providers would like to encourage you to use [facility] to communicate with providers for non-urgent requests or questions.  Due to long hold times on the telephone, sending your provider a message by [facility] may be a faster and more efficient way to get a response.  Please allow 48 business hours for a response.  Please remember that this is for non-urgent requests.  _______________________________________________________  Add bisacodyl (Doculax) I tablet at bedtime.  You have been scheduled for a CT scan of the abdomen and pelvis at [facility], 1st floor Radiology. You are scheduled on 12-05-2021 at 4pm. You should arrive 30 minutes prior to your appointment time for registration.  We are giving you 2 bottles of contrast today that you will need to drink before arriving for the exam. The solution may taste better if refrigerated so put them in the refrigerator when you get home, but do NOT add ice or any other liquid to this solution as that would dilute it. Shake well before drinking.   Please follow the written instructions below on the day of your exam:   1) Do not eat anything after 12 noon (4 hours prior to your test)   2) Drink 1 bottle of contrast @ 2pm (2 hours prior to your exam)  Remember to shake well before drinking and do NOT pour over ice.     Drink 1 bottle of contrast @ 3pm (1 hour prior to your exam)    You may take any medications as prescribed with a small amount of water, if necessary. If you take any of the following medications: METFORMIN, GLUCOPHAGE, GLUCOVANCE, AVANDAMET, RIOMET, FORTAMET, [place] MET, JANUMET, GLUMETZA or METAGLIP, you MAY be asked to HOLD this medication 48 hours AFTER the exam.   The purpose of you drinking the oral contrast is to aid in the visualization of your intestinal tract. The contrast solution may cause some diarrhea. Depending on your individual set of symptoms, you may also receive an intravenous injection of x-ray contrast/dye. Plan on being at [facility] for 45 minutes or longer, depending on the type of exam you are having performed.   If you have any questions regarding your exam or if you need to reschedule, you may call [facility] at [phone] between the hours of 8:00 am and 5:00 pm, Monday-Friday.   Due to recent changes in healthcare laws, you may see the results of your imaging and laboratory studies on MyChart before your provider has had a chance to review them.  We understand that in some cases there may be results that are confusing or concerning to you. Not all laboratory results come back in the same time frame and the provider may be waiting for multiple results in order to interpret others.  Please give [place] 48 hours in order for your provider to thoroughly review all the results before contacting the office for clarification of your results.   It was a pleasure to see you today!  Thank you for trusting me with your gastrointestinal care!

## 2021-11-27 ENCOUNTER — Telehealth: Payer: Self-pay | Admitting: Pharmacist

## 2021-11-27 NOTE — Telephone Encounter (Signed)
I have addended the clinic visit note from last month as requested.

## 2021-11-27 NOTE — Progress Notes (Signed)
Call to patient per [name] after she called the office on yesterday. Did not reach patient per chart notes she also called office because she is completely out of her sensors. Coordinated a fill with [name] for delivery of her sensors on today. Left voicemail with patient to advise she would be sent some from the pharmacy and my number to call back if she has any additional questions or concerns.    [place] Switch Clinical Pharmacist Assistant [phone]

## 2021-12-01 DIAGNOSIS — M5412 Radiculopathy, cervical region: Secondary | ICD-10-CM | POA: Diagnosis not present

## 2021-12-01 NOTE — Telephone Encounter (Signed)
Message sent to [facility], [employer] letting him know OV note was addended by Dr. Vaughan Browner.

## 2021-12-04 ENCOUNTER — Other Ambulatory Visit: Payer: Self-pay

## 2021-12-04 DIAGNOSIS — R1030 Lower abdominal pain, unspecified: Secondary | ICD-10-CM

## 2021-12-04 DIAGNOSIS — K5909 Other constipation: Secondary | ICD-10-CM

## 2021-12-05 ENCOUNTER — Ambulatory Visit (HOSPITAL_COMMUNITY)
Admission: RE | Admit: 2021-12-05 | Discharge: 2021-12-05 | Disposition: A | Discharge status: Home / Self Care | Payer: HMO | Source: Ambulatory Visit | Attending: Gastroenterology | Admitting: Gastroenterology

## 2021-12-05 ENCOUNTER — Telehealth: Payer: Self-pay | Admitting: Pharmacist

## 2021-12-05 ENCOUNTER — Ambulatory Visit (HOSPITAL_COMMUNITY): Admission: RE | Admit: 2021-12-05 | Payer: HMO | Source: Ambulatory Visit

## 2021-12-05 DIAGNOSIS — R1030 Lower abdominal pain, unspecified: Secondary | ICD-10-CM | POA: Insufficient documentation

## 2021-12-05 DIAGNOSIS — K5909 Other constipation: Secondary | ICD-10-CM | POA: Diagnosis not present

## 2021-12-05 DIAGNOSIS — N200 Calculus of kidney: Secondary | ICD-10-CM | POA: Diagnosis not present

## 2021-12-05 DIAGNOSIS — R109 Unspecified abdominal pain: Secondary | ICD-10-CM | POA: Diagnosis not present

## 2021-12-05 NOTE — Chronic Care Management (AMB) (Signed)
Chronic Care Management Pharmacy Assistant   Name: Miranda [name]  MRN: [id number] DOB: [birth year]  Reason for Encounter: Medication Review Medication Coordination   Recent office visits:  None  Recent consult visits:  12/01/21 Marcello Moores MD, [facility] (Neurosurgery) - Patient presented for Neck pain and other concerns. No medication changes.  Hospital visits:  None in previous 6 months  Medications: Outpatient Encounter Medications as of 12/05/2021  Medication Sig Note   Accu-Chek Softclix Lancets lancets Use as instructed    acetaminophen (TYLENOL) 325 MG tablet Take 650 mg by mouth every 6 (six) hours as needed for moderate pain. 11/26/2021: As needed   albuterol (VENTOLIN HFA) 108 (90 Base) MCG/ACT inhaler Inhale 1 puff into the lungs every 6 (six) hours as needed. 11/26/2021: As needes   ALPRAZolam (XANAX) 0.25 MG tablet TAKE ONE TABLET BY MOUTH three times daily AS NEEDED    atorvastatin (LIPITOR) 80 MG tablet TAKE ONE TABLET BY MOUTH EVERY EVENING    Continuous Blood Gluc Receiver (FREESTYLE LIBRE 2 READER) DEVI 1 each by Does not apply route daily.    Continuous Blood Gluc Sensor (FREESTYLE LIBRE 2 SENSOR) MISC 1 each by Does not apply route daily.    cyclobenzaprine (FLEXERIL) 5 MG tablet TAKE ONE TABLET BY MOUTH EVERY EVENING    dronedarone (MULTAQ) 400 MG tablet TAKE ONE TABLET BY MOUTH AT NOON and TAKE ONE TABLET BY MOUTH EVERY EVENING    ezetimibe (ZETIA) 10 MG tablet TAKE ONE TABLET BY MOUTH ONCE DAILY    hydrochlorothiazide (HYDRODIURIL) 25 MG tablet TAKE ONE TABLET BY MOUTH ONCE DAILY    HYDROcodone-acetaminophen (NORCO) 10-325 MG tablet Take 1 tablet by mouth every 6 (six) hours as needed for pain.    lactulose, encephalopathy, (ENULOSE) 10 GM/15ML SOLN take 39ms BY MOUTH three times daily AS NEEDED FOR mild constipation 11/26/2021: As needed   LINZESS 145 MCG CAPS capsule TAKE ONE CAPSULE BY MOUTH BEFORE BREAKFAST    losartan (COZAAR) 100 MG tablet TAKE ONE TABLET BY  MOUTH EVERY EVENING    metFORMIN (GLUCOPHAGE-XR) 500 MG 24 hr tablet TAKE ONE TABLET BY MOUTH EVERY MORNING    Pirfenidone 801 MG TABS TAKE 1 TABLET BY MOUTH 3 TIMES A DAY    XARELTO 20 MG TABS tablet TAKE ONE TABLET BY MOUTH EVERY EVENING    No facility-administered encounter medications on file as of 12/05/2021.   Reviewed chart for medication changes ahead of medication coordination call.   BP Readings from Last 3 Encounters:  11/26/21 (!) 140/64  11/12/21 138/62  10/14/21 130/72    Lab Results  Component Value Date   HGBA1C 6.4 09/18/2021     Patient obtains medications through Adherence Packaging  30 Days   Last adherence delivery included:  Hydrochlorothiazide 25 mg : take one tab at breakfast Losartan Potassium (Cozaar) 100 mg :  take one tab at evening meal Cyclobenzaprine (Flexiril)  5 mg : take one tab at evening meal Atorvastatin (Lipitor) 80 mg : take one tab at evening meal Linzess 145 mcg Take one capsule before breakfast Xarelto 20 mg : take one tab at evening meal Dronedarone (Multaq) 400 mg : take one tab at lunch and one at evening meal Ezetimbe (Zetia) 10 mg : take one tab at breakfast Freestyle Libre Sensors  Alprazolam (Xanax) 0.25 mg : Take 1 tab 3 times daily as needed   HYDROCODONE/ACETAMINOPHEN 10-325 T 10/22/2021 15 60 each        Coordinated acute fill for  Sensors to be delivered on 11/10/21 Patient reports she is out as she had to remove them for some testing she had done recently. She reports her husband passed on last week so she had forgotten to call and have them sent out to her.   Patient reports she declines needing this month: Docusate Sodium 100 mg: 1 Capsule twice daily as needed   Confirmed delivery date of 11/18/21, advised patient that pharmacy will contact them the morning of delivery.     Patient is due for next adherence delivery on: 12/17/21. Called patient and reviewed medications and coordinated delivery. Packs 30 DS  This  delivery to include: Hydrochlorothiazide 25 mg : take one tab at breakfast Losartan Potassium (Cozaar) 100 mg :  take one tab at evening meal Cyclobenzaprine (Flexiril)  5 mg : take one tab at evening meal Atorvastatin (Lipitor) 80 mg : take one tab at evening meal Linzess 145 mcg Take one capsule before breakfast Xarelto 20 mg : take one tab at evening meal Dronedarone (Multaq) 400 mg : take one tab at lunch and one at evening meal Ezetimbe (Zetia) 10 mg : take one tab at breakfast Alprazolam (Xanax) 0.25 mg : Take 1 tab 3 times daily as needed   Freestyle Sensors due to be sent to pt 12/29/21 patient aware   Patient declined the following medications   Metformin due to abundance  Patient needs refills for   Docusate  in packs  Break / dinner  Confirmed delivery date of 12/17/21, advised patient that pharmacy will contact them the morning of delivery.   Care Gaps: Eye Exam - Overdue Hepatitis C Screen - Overdue TDAP - Overdue Zoster Vaccine - Overdue Foot Exam - Overdue PNA Vaccine - Overdue COVID Booster - Overdue CCM- 10/23 BP- 130/72 10/14/21 AWV-  Last 2021- office aware to schedule as of 9/22 Lab Results  Component Value Date   HGBA1C 6.4 09/18/2021    Star Rating Drugs: Metformin (Glucophage XR) 500 mg - Last filled 02/17/2021 30 DS at [name] (Pt has been declining do to on hand supply) Losartan (Cozaar) 100 mg - Last filled 11/13/2021 30 DS at [name] Atorvastatin (Lipitor) 80 mg - Last filled 11/13/2021 30 DS at [place] Pharmacist Assistant [phone]

## 2021-12-08 ENCOUNTER — Encounter: Payer: HMO | Admitting: Internal Medicine

## 2021-12-08 DIAGNOSIS — Z006 Encounter for examination for normal comparison and control in clinical research program: Secondary | ICD-10-CM

## 2021-12-08 DIAGNOSIS — J84112 Idiopathic pulmonary fibrosis: Secondary | ICD-10-CM

## 2021-12-09 ENCOUNTER — Other Ambulatory Visit: Payer: Self-pay | Admitting: *Deleted

## 2021-12-09 DIAGNOSIS — G4733 Obstructive sleep apnea (adult) (pediatric): Secondary | ICD-10-CM

## 2021-12-22 DIAGNOSIS — M5030 Other cervical disc degeneration, unspecified cervical region: Secondary | ICD-10-CM | POA: Diagnosis not present

## 2021-12-22 DIAGNOSIS — M5416 Radiculopathy, lumbar region: Secondary | ICD-10-CM | POA: Diagnosis not present

## 2021-12-22 DIAGNOSIS — M5412 Radiculopathy, cervical region: Secondary | ICD-10-CM | POA: Diagnosis not present

## 2021-12-22 DIAGNOSIS — M5136 Other intervertebral disc degeneration, lumbar region: Secondary | ICD-10-CM | POA: Diagnosis not present

## 2021-12-25 DIAGNOSIS — M5416 Radiculopathy, lumbar region: Secondary | ICD-10-CM | POA: Diagnosis not present

## 2021-12-27 IMAGING — RF DG CERVICAL SPINE 2 OR 3 VIEWS
1 series · 2 of 2 positions shown · non-contrast
Comparison: Cervical spine MRI 08/06/2020.

CLINICAL DATA: Provided history: Surgery, elective. Additional
history provided: ACDF C4-5, C5-6. Provided fluoroscopy time 23
seconds (1.91 mGy).

EXAM:
CERVICAL SPINE - 2-3 VIEW; DG C-ARM 1-60 MIN

[Series 1: run · 2 of 2 slices shown]
[im 1/2]
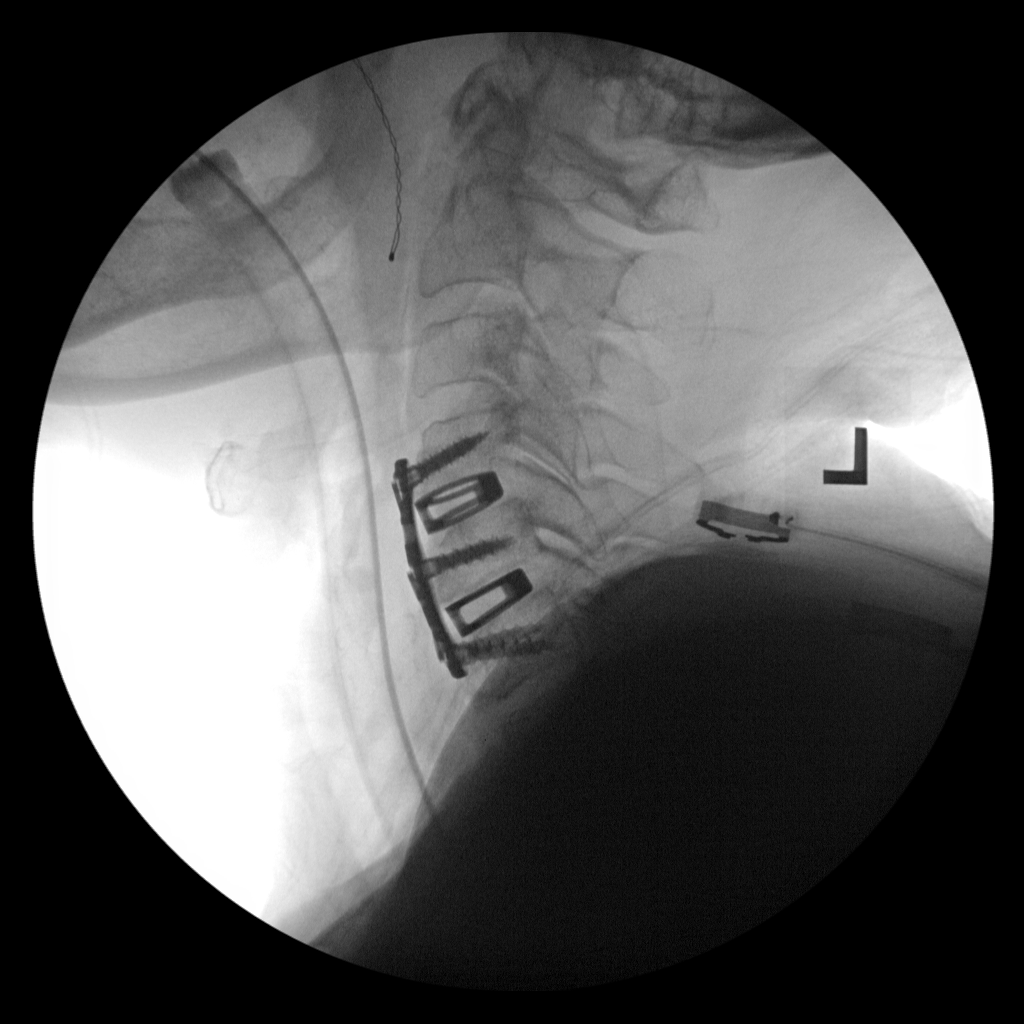
[im 2/2]
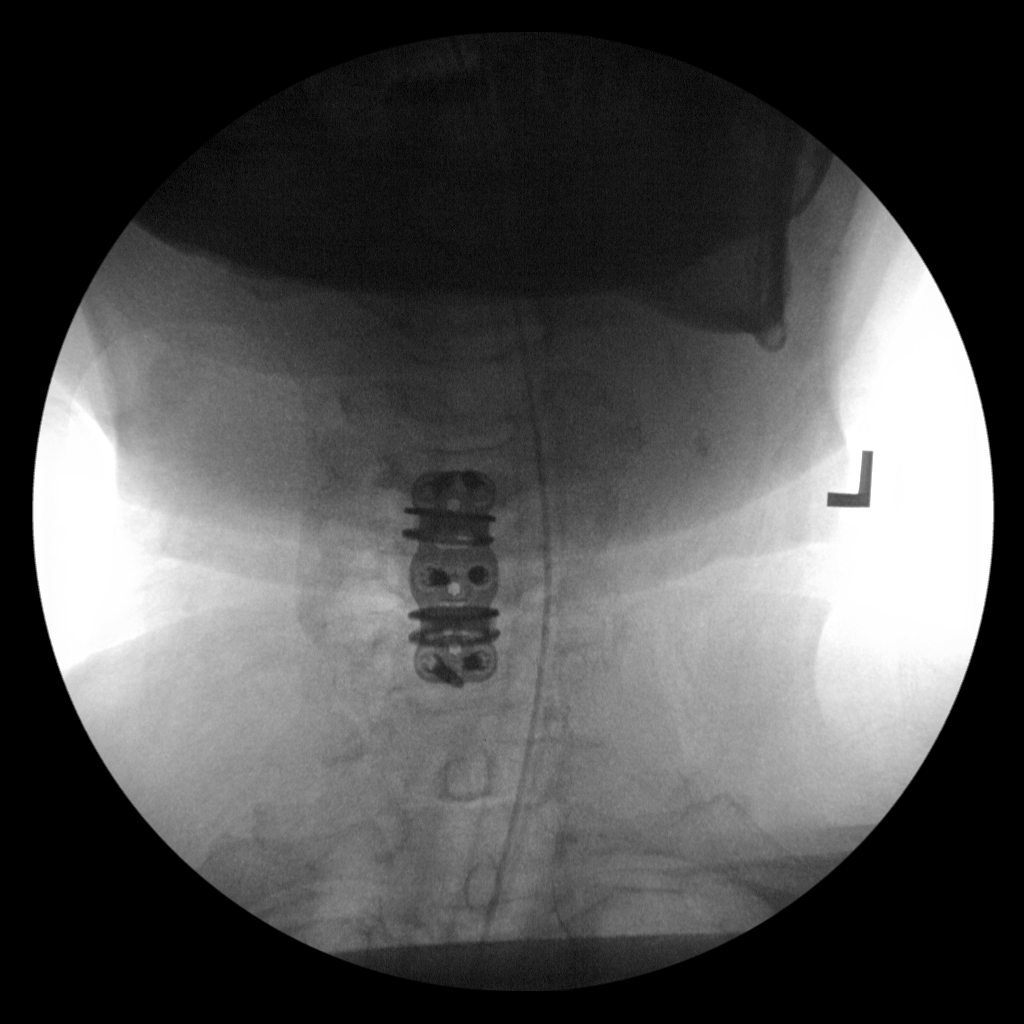

[2 of 2 positions shown; findings below may reference images not displayed]

FINDINGS: PA and lateral view intraoperative fluoroscopic images of the
cervical spine are submitted, 2 images total. ACDF hardware (ventral
plate and screws, as well as interbody devices) present at the C4-C6
levels. Partially visualized ET tube. An esophageal temperature
probe projects in the region of the oropharynx.
IMPRESSION: Two intraoperative fluoroscopic images of the cervical spine from
C4-C6 ACDF, as described.

## 2022-01-01 ENCOUNTER — Other Ambulatory Visit: Payer: Self-pay | Admitting: Internal Medicine

## 2022-01-01 ENCOUNTER — Other Ambulatory Visit: Payer: Self-pay | Admitting: Gastroenterology

## 2022-01-01 DIAGNOSIS — F419 Anxiety disorder, unspecified: Secondary | ICD-10-CM

## 2022-01-05 ENCOUNTER — Telehealth: Payer: Self-pay | Admitting: Pharmacist

## 2022-01-05 NOTE — Chronic Care Management (AMB) (Signed)
Chronic Care Management Pharmacy Assistant   Name: Miranda [name]  MRN: [id number] DOB: 14-Jun-1945  Reason for Encounter: Medication Review / Medication Coordination   Recent office visits:  None   Recent consult visits:  May 21, 2046 Miranda Champagne, MD - ([facility]) Patient presented for Lumbar radiculopathy and other concerns. No medication changes.  Hospital visits:  None in previous 6 months  Medications: Outpatient Encounter Medications as of 01/05/2022  Medication Sig Note   Accu-Chek Softclix Lancets lancets Use as instructed    acetaminophen (TYLENOL) 325 MG tablet Take 650 mg by mouth every 6 (six) hours as needed for moderate pain. 11/26/2021: As needed   albuterol (VENTOLIN HFA) 108 (90 Base) MCG/ACT inhaler Inhale 1 puff into the lungs every 6 (six) hours as needed. 11/26/2021: As needes   ALPRAZolam (XANAX) 0.25 MG tablet TAKE ONE TABLET BY MOUTH three times daily AS NEEDED    atorvastatin (LIPITOR) 80 MG tablet TAKE ONE TABLET BY MOUTH EVERY EVENING    Continuous Blood Gluc Receiver (FREESTYLE LIBRE 2 READER) DEVI 1 each by Does not apply route daily.    Continuous Blood Gluc Sensor (FREESTYLE LIBRE 2 SENSOR) MISC 1 each by Does not apply route daily.    cyclobenzaprine (FLEXERIL) 5 MG tablet TAKE ONE TABLET BY MOUTH EVERY EVENING    dronedarone (MULTAQ) 400 MG tablet TAKE ONE TABLET BY MOUTH AT NOON and TAKE ONE TABLET BY MOUTH EVERY EVENING    ezetimibe (ZETIA) 10 MG tablet TAKE ONE TABLET BY MOUTH ONCE DAILY    hydrochlorothiazide (HYDRODIURIL) 25 MG tablet TAKE ONE TABLET BY MOUTH ONCE DAILY    HYDROcodone-acetaminophen (NORCO) 10-325 MG tablet Take 1 tablet by mouth every 6 (six) hours as needed for pain.    lactulose, encephalopathy, (ENULOSE) 10 GM/15ML SOLN take 9ms BY MOUTH three times daily AS NEEDED FOR mild constipation 11/26/2021: As needed   LINZESS 145 MCG CAPS capsule TAKE ONE CAPSULE BY MOUTH BEFORE BREAKFAST    losartan (COZAAR) 100 MG tablet  TAKE ONE TABLET BY MOUTH EVERY EVENING    metFORMIN (GLUCOPHAGE-XR) 500 MG 24 hr tablet TAKE ONE TABLET BY MOUTH EVERY MORNING    Pirfenidone 801 MG TABS TAKE 1 TABLET BY MOUTH 3 TIMES A DAY    XARELTO 20 MG TABS tablet TAKE ONE TABLET BY MOUTH EVERY EVENING    No facility-administered encounter medications on file as of 01/05/2022.   Reviewed chart for medication changes ahead of medication coordination call.   BP Readings from Last 3 Encounters:  11/26/21 (!) 140/64  11/12/21 138/62  10/14/21 130/72    Lab Results  Component Value Date   HGBA1C 6.4 09/18/2021     Patient obtains medications through Adherence Packaging  30 Days   Last adherence delivery included:  Hydrochlorothiazide 25 mg : take one tab at breakfast Losartan Potassium (Cozaar) 100 mg :  take one tab at evening meal Cyclobenzaprine (Flexiril)  5 mg : take one tab at evening meal Atorvastatin (Lipitor) 80 mg : take one tab at evening meal Linzess 145 mcg Take one capsule before breakfast Xarelto 20 mg : take one tab at evening meal Dronedarone (Multaq) 400 mg : take one tab at lunch and one at evening meal Ezetimbe (Zetia) 10 mg : take one tab at breakfast Alprazolam (Xanax) 0.25 mg : Take 1 tab 3 times daily as needed   Freestyle Sensors due to be sent to pt 12/29/21 patient aware     Patient declined the following  medications    Metformin due to abundance   Patient needs refills for   Docusate  in packs  Break / dinner   Confirmed delivery date of 12/17/21, advised patient that pharmacy will contact them the morning of delivery.    Patient is due for next adherence delivery on: 01/15/22. Called patient and reviewed medications and coordinated delivery. Packs 30 DS  This delivery to include Hydrochlorothiazide 25 mg : take one tab at breakfast Losartan Potassium (Cozaar) 100 mg :  take one tab at evening meal Cyclobenzaprine (Flexiril)  5 mg : take one tab at evening meal Atorvastatin (Lipitor) 80  mg : take one tab at evening meal Linzess 145 mcg Take one capsule before breakfast Xarelto 20 mg : take one tab at evening meal Dronedarone (Multaq) 400 mg : take one tab at lunch and one at evening meal Ezetimbe (Zetia) 10 mg : take one tab at breakfast Alprazolam (Xanax) 0.25 mg : Take 1 tab 3 times daily as needed Docusate Breakfast and Dinner  Sensors sent to patient on 12/23/21 to ship again on 01/23/22 patient made aware.    Patient needs refills for  Metformin, patient requested vial as she does not take daily, added to form.  Confirmed delivery date of 01/15/22, advised patient that pharmacy will contact them the morning of delivery.   Care Gaps: Eye Exam - Overdue Hepatitis C Screen - Overdue TDAP - Overdue Zoster Vaccine - Overdue Foot Exam - Overdue PNA Vaccine - Overdue COVID Booster - Overdue CCM-10/23 BP- 140/64 11/26/21 AWV- Last 2021- office aware to schedule as of 9/22 Lab Results  Component Value Date   HGBA1C 6.4 09/18/2021    Star Rating Drugs: Metformin (Glucophage XR) 500 mg - Last filled 02/17/2021 30 DS at [name] (Pt has been declining do to on hand supply) Losartan (Cozaar) 100 mg - Last filled 12/12/2021 30 DS at [name] Atorvastatin (Lipitor) 80 mg - Last filled 12/12/2021 30 DS at [place] Pharmacist Assistant [phone]

## 2022-01-08 ENCOUNTER — Telehealth: Payer: Self-pay | Admitting: Pharmacist

## 2022-01-08 NOTE — Progress Notes (Signed)
Chronic Care Management Pharmacy Note  01/09/2022 Name:  Miranda [name] MRN:  [id number] DOB:  Feb 03, 1946   Summary: BP not at goal < 140/90 per home readings  Pt is grieving the loss of her husband  Recommendations/Changes made from today's visit: -Recommended bring BP cuff to next office visit -Recommended daily BP monitoring until PCP office visit -Reached out to LCSW for grief counseling per patient request  Plan: Scheduled PCP visit for BP follow up and OBGYN referral Follow up BP assessment in 1-2 months Follow up in 4 months  Subjective: Miranda [name] is an 76 y.o. year old female who is a primary patient of [name], [name], MD.  The CCM team was consulted for assistance with disease management and care coordination needs.    Engaged with patient by telephone for follow up visit in response to provider referral for pharmacy case management and/or care coordination services.   Consent to Services:  The patient was given information about Chronic Care Management services, agreed to services, and gave verbal consent prior to initiation of services.  Please see initial visit note for detailed documentation.   Patient Care Team: [name], [name], MD as PCP - General (Internal Medicine) [name], [name], MD as PCP - Cardiology (Cardiology) [name], MD as PCP - Electrophysiology (Cardiology) [name], PA-C (Inactive) (Dermatology) [name], [facility] as Pharmacist (Pharmacist)  Recent office visits: 09/18/21 [name], [name], MD - Patient presented for Type 2 diabetes mellitus with other specified complication without long term current use of insulin. No medication changes.  Recent consult visits:  Eliezer Champagne, MD - ([facility]) Patient presented for Lumbar radiculopathy and other concerns. No medication changes.  12/01/21 Marcello Moores MD, Dorcas Carrow (Neurosurgery) - Patient presented for Neck pain and  other concerns. No medication changes.  11/26/21 Wilfrid Lund, MD (gastroenterology): Patient presented for follow up for chronic constipation.   11/12/21 [name], MD (pulmonary): Patient presented for IPF follow up.  Follow up in 6 months.   10/14/21 [name], [name], MD (Cardiology) - Patient presented for PAF and other concerns. No medication changes.  Hospital visits: None in previous 6 months   Objective:  Lab Results  Component Value Date   CREATININE 0.93 11/26/2021   BUN 25 (H) 11/26/2021   GFR 59.58 (L) 09/18/2021   GFRNONAA 58 (L) 04/17/2021   GFRAA >60 11/04/2017   NA 134 (L) 09/18/2021   K 4.0 09/18/2021   CALCIUM 10.4 09/18/2021   CO2 31 09/18/2021   GLUCOSE 62 (L) 09/18/2021    Lab Results  Component Value Date/Time   HGBA1C 6.4 09/18/2021 10:29 AM   HGBA1C 5.9 (A) 09/18/2021 09:54 AM   HGBA1C 6.0 03/14/2021 10:19 AM   HGBA1C 5.8 (A) 12/03/2020 03:05 PM   HGBA1C 6.1 12/01/2019 10:25 AM   GFR 59.58 (L) 09/18/2021 10:29 AM   GFR 64.67 04/24/2021 03:01 PM   MICROALBUR <0.7 09/18/2021 10:29 AM    Last diabetic Eye exam: No results found for: "HMDIABEYEEXA"  Last diabetic Foot exam: No results found for: "HMDIABFOOTEX"   Lab Results  Component Value Date   CHOL 130 04/14/2021   HDL 60 04/14/2021   LDLCALC 56 04/14/2021   LDLDIRECT 152.8 03/30/2011   TRIG 72 04/14/2021   CHOLHDL 2.2 04/14/2021       Latest Ref Rng & Units 09/18/2021   10:29 AM 04/24/2021    3:01 PM 04/14/2021    6:00 AM  Hepatic  Function  Total Protein 6.0 - 8.3 g/dL 8.0  7.3  8.0   Albumin 3.5 - 5.2 g/dL 4.5  3.9  4.1   AST 0 - 37 U/L _0 ALT 0 - 35 U/L _1 Alk Phosphatase 39 - 117 U/L 39  36  39   Total Bilirubin 0.2 - 1.2 mg/dL 0.4  0.5  0.6     Lab Results  Component Value Date/Time   TSH 1.274 04/14/2021 11:26 AM   TSH 1.45 12/01/2019 10:25 AM   TSH 1.499 09/14/2017 04:58 PM   TSH 0.86 06/01/2014 11:45 AM       Latest Ref Rng & Units 09/18/2021    10:29 AM 04/15/2021    2:07 AM 04/14/2021    6:00 AM  CBC  WBC 4.0 - 10.5 K/uL 5.6  4.9  7.0   Hemoglobin 12.0 - 15.0 g/dL 12.7  10.6  13.0   Hematocrit 36.0 - 46.0 % 38.3  31.3  38.2   Platelets 150.0 - 400.0 K/uL 188.0  151  216     Lab Results  Component Value Date/Time   VD25OH 36.31 08/20/2021 09:00 AM   VD25OH 27.69 (L) 04/24/2021 03:01 PM    Clinical ASCVD: No  The ASCVD Risk score (Arnett DK, et al., 2019) failed to calculate for the following reasons:   The valid systolic blood pressure range is 90 to 200 mmHg       04/24/2021    2:42 PM 03/14/2021   10:05 AM 12/03/2020    5:12 PM  Depression screen PHQ 2/9  Decreased Interest 1 0 0  Down, Depressed, Hopeless 0 1 1  PHQ - 2 Score _2 Altered sleeping _3 Tired, decreased energy _4 Change in appetite 3 0 0  Feeling bad or failure about yourself  0 1 0  Trouble concentrating 1 0 0  Moving slowly or fidgety/restless 1 0 0  Suicidal thoughts 0 0 0  PHQ-9 Score _5 Difficult doing work/chores  Not difficult at all      CHA2DS2/VAS Stroke Risk Points  Current as of yesterday     5 >= 2 Points: High Risk  1 - 1.99 Points: Medium Risk  0 Points: Low Risk    Last Change: N/A      Details    This score determines the patient's risk of having a stroke if the  patient has atrial fibrillation.       Points Metrics  0 Has Congestive Heart Failure:  No    Current as of yesterday  1 Has Vascular Disease:  Yes    Current as of yesterday  1 Has Hypertension:  Yes    Current as of yesterday  1 Age:  76    Current as of yesterday  1 Has Diabetes:  Yes    Current as of yesterday  0 Had Stroke:  No  Had TIA:  No  Had Thromboembolism:  No    Current as of yesterday  1 Female:  Yes    Current as of yesterday     Social History   Tobacco Use  Smoking Status Never   Passive exposure: Never  Smokeless Tobacco Never   BP Readings from Last 3 Encounters:  11/26/21 (!) 140/64  11/12/21 138/62   10/14/21 130/72   Pulse Readings from Last 3 Encounters:  11/26/21 (!) 55  11/12/21 (!) 58  10/14/21 60   Wt Readings from Last 3 Encounters:  11/26/21 180 lb (81.6 kg)  11/12/21 176 lb (79.8 kg)  10/14/21 176 lb 12.8 oz (80.2 kg)   BMI Readings from Last 3 Encounters:  11/26/21 29.05 kg/m  11/12/21 28.41 kg/m  10/14/21 28.97 kg/m    Assessment/Interventions: Review of patient past medical history, allergies, medications, health status, including review of consultants reports, laboratory and other test data, was performed as part of comprehensive evaluation and provision of chronic care management services.   SDOH:  (Social Determinants of Health) assessments and interventions performed: Yes  SDOH Interventions    Flowsheet Row Chronic Care Management from 01/09/2022 in [place] [facility] [place] from 09/18/2021 in [place] at [place] Management from 08/07/2019 in [place] [facility] [place]  SDOH Interventions     Transportation Interventions -- -- Intervention Not Indicated  Depression Interventions/Treatment  -- Currently on Treatment --  Financial Strain Interventions Intervention Not Indicated -- Intervention Not Indicated      SDOH Screenings   Food Insecurity: No Food Insecurity (03/28/2019)  Transportation Needs: No Transportation Needs (08/07/2019)  Depression (PHQ2-9): Medium Risk (04/24/2021)  Financial Resource Strain: Low Risk  (01/09/2022)  Tobacco Use: Low Risk  (11/26/2021)    CCM Care Plan  Allergies  Allergen Reactions   Fish Oil Anaphylaxis   Other Hives, Shortness Of Breath and Swelling    Allergic to cashew nuts and peanut oil.   Penicillins Anaphylaxis, Hives and Swelling    Has patient had a PCN reaction causing immediate rash, facial/tongue/throat swelling, SOB or lightheadedness with hypotension: Face swelling and hives started first, then swelling of the throat  Has patient had a PCN reaction  causing severe rash involving mucus membranes or skin necrosis: Yes  Has patient had a PCN reaction that required hospitalization: No  Has patient had a PCN reaction occurring within the last 10 years: Yes  If all of the above answers are "NO", then may proceed with Cephalospor   Pneumococcal Vaccines Nausea And Vomiting   Cashew Nut Oil    Nitrofurantoin Macrocrystal Hives   Peanut Oil    Aspirin Itching and Rash   Bactrim [Sulfamethoxazole-Trimethoprim] Hives, Itching and Rash   Ciprofloxacin Hives, Itching and Rash   Ibuprofen Rash   Influenza Vaccines Hives   Ivp Dye [Iodinated Contrast Media] Hives, Itching and Rash    Gives benadryl to counteract symptoms   Latex Rash   Macrobid [Nitrofurantoin Monohydrate Macrocrystals] Hives   Shellfish Allergy Hives    Patient also allergic to seafood    Medications Reviewed Today     Reviewed by [name], [facility] (Pharmacist) on 01/09/22 at 1326  Med List Status: <None>   Medication Order Taking? Sig Documenting Provider Last Dose Status Informant  Accu-Chek Softclix Lancets lancets [id number] No Use as instructed [name], [name], MD Taking Active Self  acetaminophen (TYLENOL) 325 MG tablet [id number] No Take 650 mg by mouth every 6 (six) hours as needed for moderate pain. [provider] Taking Active Self           Med Note (RATLIFF, THURSHELL   Wed Nov 26, 2021  2:14 PM) As needed  albuterol (VENTOLIN HFA) 108 (90 Base) MCG/ACT inhaler [id number] No Inhale 1 puff into the lungs every 6 (six) hours as needed. [name], [name], MD Taking Active Self           Med Note (RATLIFF, THURSHELL  Wed Nov 26, 2021  2:15 PM) As needes  ALPRAZolam Duanne Moron) 0.25 MG tablet [id number] No TAKE ONE TABLET BY MOUTH three times daily AS NEEDED [name], [name], MD Taking Active   atorvastatin (LIPITOR) 80 MG tablet [id number] No TAKE ONE TABLET BY MOUTH EVERY EVENING [name], [name], MD Taking Active    Continuous Blood Gluc Receiver (FREESTYLE LIBRE 2 READER) DEVI [id number] No 1 each by Does not apply route daily. [name], [name], MD Taking Active   Continuous Blood Gluc Sensor (FREESTYLE LIBRE 2 SENSOR) [place] [id number]  1 each by Does not apply route daily. [name], [name], MD  Active   cyclobenzaprine (FLEXERIL) 5 MG tablet [id number] No TAKE ONE TABLET BY MOUTH EVERY EVENING [name], [name], MD Taking Active   dronedarone (MULTAQ) 400 MG tablet [id number] No TAKE ONE TABLET BY MOUTH AT NOON and TAKE ONE TABLET BY MOUTH EVERY EVENING [name], Will Hassell Done, MD Taking Active   ezetimibe (ZETIA) 10 MG tablet [id number] No TAKE ONE TABLET BY MOUTH ONCE DAILY [name], [name], MD Taking Active   hydrochlorothiazide (HYDRODIURIL) 25 MG tablet [id number] No TAKE ONE TABLET BY MOUTH ONCE DAILY [name], [name], MD Taking Active   HYDROcodone-acetaminophen [facility]) 10-325 MG tablet [id number] No Take 1 tablet by mouth every 6 (six) hours as needed for pain. [provider] Taking Active Self  lactulose, encephalopathy, (ENULOSE) 10 GM/15ML SOLN [id number] No take 70ms BY MOUTH three times daily AS NEEDED FOR mild constipation HIsaac Bliss ERayford Halsted MD Taking Active            Med Note (RATLIFF, TLivingston DionesSep 13, 2023  2:18 PM) As needed  LINZESS 145 MCG CAPS capsule [id number] TAKE ONE CAPSULE BY MOUTH BEFORE BREAKFAST DDoran Stabler MD  Active   losartan (COZAAR) 100 MG tablet [id number] TAKE ONE TABLET BY MOUTH EVERY EVENING HIsaac Bliss ERayford Halsted MD Taking Active   metFORMIN (GLUCOPHAGE-XR) 500 MG 24 hr tablet [id number] TAKE ONE TABLET BY MOUTH EVERY MORNING HIsaac Bliss ERayford Halsted MD Taking Active Self  Pirfenidone 801 MG TABS [id number] TAKE 1 TABLET BY MOUTH 3 TIMES A DAY MMarshell Garfinkel MD Taking Active   XARELTO 20 MG TABS tablet [id number] TAKE ONE TABLET BY MOUTH EVERY EVENING HIsaac Bliss ERayford Halsted MD  Taking Active             Patient Active Problem List   Diagnosis Date Noted   Malnutrition of moderate degree 04/15/2021   Chest pain 04/14/2021   Elevated troponin 04/14/2021   History of pulmonary embolus (PE) 04/14/2021   AKI (acute kidney injury) ([place] 04/14/2021   Cervical radiculopathy 09/11/2020   Vitamin D deficiency 12/05/2019   Healthcare maintenance 11/09/2018   IPF (idiopathic pulmonary fibrosis) ([facility] 11/09/2018   Therapeutic drug monitoring 04/20/2018   Paroxysmal atrial fibrillation ([facility] 04/20/2018   Atrial fibrillation with RVR ([facility] 11/03/2017   LVH (left ventricular hypertrophy) 09/22/2017   Diabetes mellitus without complication ([place] 092/01/9416  Degeneration of lumbar intervertebral disc 07/06/2017   Pulmonary emboli ([facility] 05/02/2016   Right leg pain 05/01/2016   ILD (interstitial lung disease) ([facility] 05/01/2016   Bradycardia 05/01/2016   Coronary artery calcification seen on CAT scan 08/06/2015   Open angle with borderline findings, low risk, bilateral 04/29/2015   UIP (usual interstitial pneumonitis) ([facility] 04/05/2015   Bilateral posterior capsular opacification 09/21/2014   Pseudophakia of both eyes 07/27/2014  Combined form of senile cataract of both eyes 05/30/2014   Osteoarthritis 09/17/2011   Narrowing of intervertebral disc space 09/17/2011   Pyelonephritis with possible nonobstructing 5 mm calculus by renal ultrasound 09/16/2011   Hypokalemia 09/16/2011   External hemorrhoids 09/13/2011   Obstructive sleep apnea syndrome 06/10/2011   Anal fissure 02/17/2011   History of colonic polyps 04/03/2010   Respiratory condition due to chemical fumes and vapors ([place]) 01/20/2010   Urinary incontinence 04/30/2009   Insomnia 09/28/2007   Asthma 11/22/2006   Dyslipidemia 08/19/2006   HTN (hypertension) 08/19/2006   GERD 08/19/2006   Low back pain 08/19/2006    Immunization History  Administered Date(s) Administered   Fluad Quad(high Dose 65+)  11/29/2019, 12/03/2020   PFIZER(Purple Top)SARS-COV-2 Vaccination 04/21/2019, 05/12/2019, 12/15/2019   PPD Test 11/19/2010   Pneumococcal Polysaccharide-23 11/29/2019   Unspecified SARS-COV-2 Vaccination 02/13/2021   Patient reports she is straining and is still having issues with hemorrhoids. Patient is drinking a lot of water and is drinking prune juice with her Miralax.   Patient reports her husband passed and some other family members support. She is having visitors come sometimes to check in on her. Patient reports grief counseling would be helpful.   Patient was having headaches with her BP in 180-190s. She reports more headaches lately than usual.   Conditions to be addressed/monitored:  Hypertension, Hyperlipidemia, Diabetes, Atrial Fibrillation, Asthma, Anxiety and fluid retention, back pain, constipation, IPF  Conditions addressed this visit: Hypertension, diabetes, constipation  Care Plan : [place]  Updates made by [name], [place] since 01/09/2022 12:00 AM     Problem: Problem: Hypertension, Hyperlipidemia, Diabetes, Atrial Fibrillation, Asthma, Anxiety and fluid retention, back pain, constipation, IPF      Long-Range Goal: Patient-Specific Goal   Start Date: 06/10/2020  Expected End Date: 05/13/2021  Recent Progress: On track  Priority: High  Note:   Current Barriers:  Unable to independently monitor therapeutic efficacy Unable to self administer medications as prescribed  Pharmacist Clinical Goal(s):  Patient will achieve adherence to monitoring guidelines and medication adherence to achieve therapeutic efficacy achieve ability to self administer medications as prescribed through use of adherence packaging as evidenced by patient report through collaboration with PharmD and provider.   Interventions: 1:1 collaboration with [name], [name], MD regarding development and update of comprehensive plan of care as evidenced by provider  attestation and co-signature Inter-disciplinary care team collaboration (see longitudinal plan of care) Comprehensive medication review performed; medication list updated in electronic medical record  Hypertension (BP goal <140/90) -Uncontrolled per home readings -Current treatment: HCTZ 25 mg 1 tablet daily - Appropriate, Query effective, Safe, Accessible Losartan 100 mg 1 tablet daily - Appropriate, Query effective, Safe, Accessible -Medications previously tried: benazepril, metoprolol, HCTZ, losartan, amlodipine  -Current home readings: 186/90, 132/65, 168/80, 131/64, 162/80, 193/76 - checking 3 times a week; patient  -Current dietary habits: patient reports working on it and has cut out salt and pork; patient tries to eat a lot more fish and vegetables -Current exercise habits: patient reports she walks most days; recommended goal of 150 min/week  -Denies hypotensive/hypertensive symptoms -Educated on Daily salt intake goal < 2300 mg; Exercise goal of 150 minutes per week; Importance of home blood pressure monitoring; -Counseled to monitor BP at home at least weekly, document, and provide log at future appointments -Counseled on diet and exercise extensively Recommended to continue current medication  Hyperlipidemia: (LDL goal < 55) -Not ideally controlled -Current treatment: Atorvastatin 60m, 1  tablet once daily - Appropriate, Query effective, Safe, Accessible Ezetimibe 10 mg, 1 tablet once daily - Appropriate, Query effective, Safe, Accessible -Medications previously tried: lovastatin  -Current dietary patterns: patient tries to eat a lot more fish and vegetables -Current exercise habits:  patient reports she walks most days; recommended goal of 150 min/week  -Educated on Cholesterol goals;  Benefits of statin for ASCVD risk reduction; Importance of limiting foods high in cholesterol; Exercise goal of 150 minutes per week; -Counseled on diet and exercise  extensively Recommended to continue current medication  Diabetes (A1c goal <7%) -Controlled -Current medications: Metformin XR 500 mg 1 tablet before breakfast - Appropriate, Effective, Query Safe, Accessible -Medications previously tried: none  -Current home glucose readings: 60s more than usual; 80s having lows in the morning: lowest 65 (saw this for 2-3 days for 1 or 2 weeks) fasting glucose: 90-102 post prandial glucose: 180, 200 -Denies hypoglycemic/hyperglycemic symptoms -Current meal patterns:  breakfast: n/a  lunch: n/a  dinner: n/a snacks: n/a drinks: n/a -Current exercise: walking daily -Educated on Exercise goal of 150 minutes per week; Prevention and management of hypoglycemic episodes; Continuous glucose monitoring; Carbohydrate counting and/or plate method -Counseled to check feet daily and get yearly eye exams -Recommended to continue current medication  Atrial Fibrillation (Goal: prevent stroke and major bleeding) -Controlled -CHADSVASC: 5 -Current treatment: Rate control: Multaq 400 mg 1 tablet twice daily with a meal - Appropriate, Effective, Safe, Accessible Anticoagulation: Xarelto 20 mg 1 tablet daily with dinner - Appropriate, Effective, Safe, Accessible -Medications previously tried: none -Home BP and HR readings: did not provide any -Counseled on  the importance of taking both medications with a meal -Recommended to continue current medication  Asthma/IPF (Goal: control symptoms and prevent exacerbations) -Controlled -Current treatment  Albuterol HFA 156mg, act inhaler, inhale 1 puff every six hours as needed  - Appropriate, Effective, Safe, Accessible Pirfenidone 80102m 1 tablet in the morning, at noon, and at bedtime  - Appropriate, Effective, Safe, Accessible -Medications previously tried: none  -Patient denies consistent use of maintenance inhaler -Frequency of rescue inhaler use: as needed -Counseled on Proper inhaler technique; When to use  rescue inhaler -Recommended to continue current medication   Fluid retention (Goal: minimize swelling) -Controlled -Current treatment  None -Medications previously tried: furosemide  -Recommended to continue current medication Counseled on limiting salt intake to prevent fluid retention  Back pain (Goal: minimize symptoms of pain) -Controlled -Current treatment  Hydrocodone/APAP 10/32551m1 tablet every six hours as needed (takes as indicated) - Appropriate, Effective, Safe, Accessible Cyclobenzaprine 5mg25m tablet at bedtime  - Appropriate, Effective, Safe, Accessible Diclofenac 1% gel, apply 2 g four times daily  - Appropriate, Effective, Safe, Accessible APAP 500 mg, 1 tablet as needed for pain - Appropriate, Effective, Safe, Accessible -Medications previously tried: meloxicam, naproxen, oxycodone  -Recommended to continue current medication  Anxiety (Goal: minimize symptoms) -Not ideally controlled -Current treatment: Alprazolam 0.25mg73m tablet three times daily as needed for anxiety - Appropriate, Effective, Query Safe, Accessible -Medications previously tried/failed: none -GAD7: n/a -Educated on Benefits of medication for symptom control -Recommended to continue current medication  Constipation (Goal: normal bowel movements) -Controlled -Current treatment  docusate 100mg,61mapsule twice daily - Appropriate, Effective, Safe, Accessible Lactulose solution, take 30 ML three times daily as needed for mild constipation - not taking right now Miralax, 17g one daily (not a part of daily regimen) Linzess 145 mcg daily - Appropriate, Effective, Safe, Query accessible -Medications previously tried: bisacodyl, Linzess (previous  loose stools)  -Recommended to continue current medication Assessed patient finances. Provided PAP application for Linzess in office.   Health Maintenance -Vaccine gaps: shingles, tetanus, Prevnar20, COVID booster -Current therapy:  Biotin 5000 mcg  1 capsule daily -Educated on Cost vs benefit of each product must be carefully weighed by individual consumer -Patient is satisfied with current therapy and denies issues -Recommended to continue current medication  Patient Goals/Self-Care Activities Patient will:  - take medications as prescribed check blood pressure at least weekly, document, and provide at future appointments target a minimum of 150 minutes of moderate intensity exercise weekly  Follow Up Plan: Telephone follow up appointment with care management team member scheduled for:4 months       Medication Assistance: None required.  Patient affirms current coverage meets needs.   Compliance/Adherence/Medication fill history: Care Gaps: Shingrix, foot exam, Hep C screening, eye exam, tetanus, Prevnar 20, COVID booster, AWV Last BP - 140/64 on 11/26/2021 Last A1C - 6.4 on 09/18/2021  Star-Rating Drugs: Metformin 500 mg - Last filled 02/17/2021 30 [facility] at [name] shipment due 01/15/2022 Losartan 100 mg - Last filled 12/12/2021 [facility] at [name] Atorvastatin 80 mg - Last filled 12/12/2021 30 [facility] at [name]  Patient's preferred pharmacy is:  [name] - [place], [place] - [address] [place] [address] Phone: [phone] Fax: [phone]  [facility], [place] - [address] [place] [address] Phone: [phone] Fax: [phone]  Uses pill box? Yes Pt endorses 90% compliance   We discussed: Benefits of medication synchronization, packaging and delivery as well as enhanced pharmacist oversight with [name]. Patient decided to: Utilize [name] pharmacy for medication synchronization, packaging and delivery  Care Plan and Follow Up Patient Decision:  Patient agrees to Care Plan and Follow-up.  Plan: Telephone follow up appointment with care management team member scheduled for:  4 months  [name],  PharmD [place] Pharmacist [place] [facility] [place] [phone]

## 2022-01-08 NOTE — Chronic Care Management (AMB) (Signed)
    Chronic Care Management Pharmacy Assistant   Name: Miranda [name]  MRN: [id number] DOB: Jul 29, 1945  01/09/2022 APPOINTMENT REMINDER  Called Miranda [name], No answer, left message of appointment on 01/09/2022 at 1:00 via telephone visit with [name], Pharm D. Notified to have all medications, supplements, blood pressure and/or blood sugar logs available during appointment and to return call if need to reschedule.  Care Gaps: AWV - completed in 2021 Last BP - 140/64 on 11/26/2021 Last A1C - 6.4 on 09/18/2021 AWV - never done Eye exam - never done Hep C Screen - never done Tdap - never done Shingrix - never done Pneumonia - overdue Covid - overdue  Star Rating Drug: Metformin 500 mg - Last filled 02/17/2021 30 DS at [name] shipment due 01/15/2022 Losartan 100 mg - Last filled 12/12/2021 30 [facility] at [name] Atorvastatin 80 mg - Last filled 12/12/2021 30 [facility] at [name]  Any gaps in medications fill history?  [place] Pharmacist Assistant [phone]

## 2022-01-09 ENCOUNTER — Ambulatory Visit (INDEPENDENT_AMBULATORY_CARE_PROVIDER_SITE_OTHER): Payer: HMO | Admitting: Pharmacist

## 2022-01-09 DIAGNOSIS — E1169 Type 2 diabetes mellitus with other specified complication: Secondary | ICD-10-CM

## 2022-01-09 DIAGNOSIS — I10 Essential (primary) hypertension: Secondary | ICD-10-CM

## 2022-01-13 ENCOUNTER — Other Ambulatory Visit: Payer: Self-pay | Admitting: Internal Medicine

## 2022-01-13 DIAGNOSIS — I10 Essential (primary) hypertension: Secondary | ICD-10-CM

## 2022-01-13 DIAGNOSIS — J45909 Unspecified asthma, uncomplicated: Secondary | ICD-10-CM

## 2022-01-13 DIAGNOSIS — E785 Hyperlipidemia, unspecified: Secondary | ICD-10-CM

## 2022-01-13 DIAGNOSIS — I4891 Unspecified atrial fibrillation: Secondary | ICD-10-CM

## 2022-01-13 DIAGNOSIS — Z7984 Long term (current) use of oral hypoglycemic drugs: Secondary | ICD-10-CM

## 2022-01-13 DIAGNOSIS — E1169 Type 2 diabetes mellitus with other specified complication: Secondary | ICD-10-CM

## 2022-01-16 ENCOUNTER — Telehealth: Payer: Self-pay | Admitting: Licensed Clinical Social Worker

## 2022-01-16 NOTE — Research (Signed)
IPF-PRO Registry  Purpose: To collect data and biological samples that will support future research studies.  The goal of the registry is to research the current approaches to diagnosis, treatment, and progression of IPF. In addition, the registry will analyze participant characteristics, assess quality of life, describe participants interactions with the health care system, determine IPF treatment practices across multiple institutions, and utilize biological samples to identify disease biomarkers.    Hollace Kinnier 08-Dec-2021

## 2022-01-16 NOTE — Patient Instructions (Signed)
Visit Information  Thank you for taking time to visit with me today. Please don't hesitate to contact me if I can be of assistance to you.   Following are the goals we discussed today:   Goals Addressed             This Visit's Progress    Strengthen Support (Grief)   On track    Care Coordination Interventions: Active listening / Reflection utilized  Emotional Support Provided LCSW informed patient of role in coordination services. Pt is interested and scheduled initial appt LCSW informed CCM Pharmacist that an appt has been scheduled          Our next appointment is by telephone on 01/23/22 at 1 PM  Please call the care guide team at [phone] if you need to cancel or reschedule your appointment.   If you are experiencing a Mental Health or [place] or need someone to talk to, please call the Suicide and Crisis Lifeline: 988 call 911   The patient verbalized understanding of instructions, educational materials, and care plan provided today and DECLINED offer to receive copy of patient instructions, educational materials, and care plan.   Christa See, MSW, [email or web address] Phone [phone] 9:39 AM

## 2022-01-16 NOTE — Patient Outreach (Signed)
  Care Coordination   Initial Visit Note   01/16/2022 Name: Miranda [name] MRN: [id number] DOB: [birth year]  Miranda [name] is a 76 y.o. year old female who sees [name], [name], MD for primary care. I spoke with  Miranda [name] by phone today.  What matters to the patients health and wellness today?  Care Coordination    Goals Addressed             This Visit's Progress    Strengthen Support (Grief)   On track    Care Coordination Interventions: Active listening / Reflection utilized  Emotional Support Provided LCSW informed patient of role in coordination services. Pt is interested and scheduled initial appt LCSW informed CCM Pharmacist that an appt has been scheduled          SDOH assessments and interventions completed:  No     Care Coordination Interventions Activated:  Yes  Care Coordination Interventions:  Yes, provided   Follow up plan: Follow up call scheduled for 01/23/22    Encounter Outcome:  Pt. Scheduled   Christa See, MSW, [email or web address] Phone [phone] 9:39 AM

## 2022-01-20 ENCOUNTER — Encounter: Payer: Self-pay | Admitting: Internal Medicine

## 2022-01-20 ENCOUNTER — Ambulatory Visit (INDEPENDENT_AMBULATORY_CARE_PROVIDER_SITE_OTHER): Payer: HMO | Admitting: Internal Medicine

## 2022-01-20 VITALS — BP 128/63 | HR 65 | Temp 98.4°F | Wt 178.1 lb

## 2022-01-20 DIAGNOSIS — I10 Essential (primary) hypertension: Secondary | ICD-10-CM | POA: Diagnosis not present

## 2022-01-20 DIAGNOSIS — J849 Interstitial pulmonary disease, unspecified: Secondary | ICD-10-CM

## 2022-01-20 DIAGNOSIS — E785 Hyperlipidemia, unspecified: Secondary | ICD-10-CM

## 2022-01-20 DIAGNOSIS — E1169 Type 2 diabetes mellitus with other specified complication: Secondary | ICD-10-CM

## 2022-01-20 DIAGNOSIS — Z23 Encounter for immunization: Secondary | ICD-10-CM

## 2022-01-20 DIAGNOSIS — K5900 Constipation, unspecified: Secondary | ICD-10-CM | POA: Diagnosis not present

## 2022-01-20 DIAGNOSIS — I4891 Unspecified atrial fibrillation: Secondary | ICD-10-CM

## 2022-01-20 LAB — POCT GLYCOSYLATED HEMOGLOBIN (HGB A1C): Hemoglobin A1C: 5.8 % — AB (ref 4.0–5.6)

## 2022-01-20 NOTE — Progress Notes (Signed)
Established Patient Office Visit     CC/Reason for Visit: Follow-up chronic conditions  HPI: Miranda [name] is a 76 y.o. female who is coming in today for the above mentioned reasons. Past Medical History is significant for: Hypertension, hyperlipidemia, type 2 diabetes, atrial fibrillation on chronic anticoagulation, IPF/ILD.  She continues to complain of significant constipation, she is otherwise doing well.  She takes MiraLAX daily in addition to [place].  She has been seen by GI for this issue.  She is requesting a flu vaccine.  She is also due for a conjugated pneumonia vaccine.   Past Medical/Surgical History: Past Medical History:  Diagnosis Date   Anxiety    Arthritis    Asthma    Atrial fibrillation (HCC)    CHF (congestive heart failure) ([place])    pt. unsure- but thinks she was hosp. for CHF- 2002   Chronic kidney disease    [facility]   Clotting disorder ([place])    blood clots in lungs/PE pulmonary embolism   Colon polyps    Complication of anesthesia    states requires a lot med. to put her to sleep    DDD (degenerative disc disease) 09/17/2011   Depression    "sometimes "   DM (diabetes mellitus) ([place])    Family history of anesthesia complication    Sister had difficulty waking up from anesthesia   GERD (gastroesophageal reflux disease)    Glaucoma    bilateral, pt. admits that she is noncompliant to eye gtts.    Hemorrhoids    Hyperlipidemia    Hypertension    had stress, echo- 2006 /w [place], Cardiac Cath, per pt. 2002, echo repeated 2012- wnl    IPF (idiopathic pulmonary fibrosis) (HCC)    Low back pain    Shortness of breath    Sleep apnea    uses c-pap- q night recently    Past Surgical History:  Procedure Laterality Date   ABDOMINAL HYSTERECTOMY     ectopic, fibroids   ANTERIOR CERVICAL DECOMP/DISCECTOMY FUSION N/A 09/11/2020   Procedure: Anterior Cervical Decompression Fusion Cervical four-five, Cervical five-six;  Surgeon:  [name] Mare, MD;  Location: [facility];  Service: Neurosurgery;  Laterality: N/A;   arm surgery Right    CARDIAC CATHETERIZATION     CATARACT EXTRACTION, BILATERAL     cataracts removed bilateral- ?IOL   COLONOSCOPY     remote   FISSURECTOMY  10/08/2011   Procedure: FISSURECTOMY;  Surgeon: Stark Klein, MD;  Location: [facility];  Service: General;  Laterality: N/A;   FLEXIBLE SIGMOIDOSCOPY  02/25/2011   Procedure: FLEXIBLE SIGMOIDOSCOPY;  Surgeon: Inda Castle, MD;  Location: [facility];  Service: Endoscopy;  Laterality: N/A;   FOOT SURGERY     bilat, heel spurs- screw in R foot    HEMORRHOID SURGERY  10/08/2011   Procedure: HEMORRHOIDECTOMY;  Surgeon: Stark Klein, MD;  Location: [facility];  Service: General;  Laterality: N/A;  External    ROTATOR CUFF REPAIR Right    SPHINCTEROTOMY  10/08/2011   Procedure: Joan Mayans;  Surgeon: Stark Klein, MD;  Location: [facility];  Service: General;  Laterality: N/A;    Social History:  reports that she has never smoked. She has never been exposed to tobacco smoke. She has never used smokeless tobacco. She reports that she does not drink alcohol and does not use drugs.  Allergies: Allergies  Allergen Reactions   Fish Oil Anaphylaxis   Other Hives, Shortness Of Breath and Swelling  Allergic to cashew nuts and peanut oil.   Penicillins Anaphylaxis, Hives and Swelling    Has patient had a PCN reaction causing immediate rash, facial/tongue/throat swelling, SOB or lightheadedness with hypotension: Face swelling and hives started first, then swelling of the throat  Has patient had a PCN reaction causing severe rash involving mucus membranes or skin necrosis: Yes  Has patient had a PCN reaction that required hospitalization: No  Has patient had a PCN reaction occurring within the last 10 years: Yes  If all of the above answers are "NO", then may proceed with Cephalospor   Pneumococcal Vaccines Nausea And Vomiting   Cashew Nut Oil     Nitrofurantoin Macrocrystal Hives   Peanut Oil    Aspirin Itching and Rash   Bactrim [Sulfamethoxazole-Trimethoprim] Hives, Itching and Rash   Ciprofloxacin Hives, Itching and Rash   Ibuprofen Rash   Influenza Vaccines Hives   Ivp Dye [Iodinated Contrast Media] Hives, Itching and Rash    Gives benadryl to counteract symptoms   Latex Rash   Macrobid [Nitrofurantoin Monohydrate Macrocrystals] Hives   Shellfish Allergy Hives    Patient also allergic to seafood    Family History:  Family History  Problem Relation Age of Onset   Heart attack Mother    Heart disease Mother    Breast cancer Mother    Emphysema Sister    Breast cancer Sister    Arthritis/Rheumatoid Sister    Asthma Sister    Lung cancer Sister    COPD Sister    Colon cancer Brother    Colon cancer Brother    Anesthesia problems Neg Hx    Esophageal cancer Neg Hx    Rectal cancer Neg Hx    Stomach cancer Neg Hx      Current Outpatient Medications:    Accu-Chek Softclix Lancets lancets, Use as instructed, Disp: 100 each, Rfl: 12   acetaminophen (TYLENOL) 325 MG tablet, Take 650 mg by mouth every 6 (six) hours as needed for moderate pain., Disp: , Rfl:    albuterol (VENTOLIN HFA) 108 (90 Base) MCG/ACT inhaler, Inhale 1 puff into the lungs every 6 (six) hours as needed., Disp: 18 g, Rfl: 1   ALPRAZolam (XANAX) 0.25 MG tablet, TAKE ONE TABLET BY MOUTH three times daily AS NEEDED, Disp: 90 tablet, Rfl: 2   atorvastatin (LIPITOR) 80 MG tablet, TAKE ONE TABLET BY MOUTH EVERY EVENING, Disp: 90 tablet, Rfl: 1   Continuous Blood Gluc Receiver (FREESTYLE LIBRE 2 READER) DEVI, 1 each by Does not apply route daily., Disp: 2 each, Rfl: 6   Continuous Blood Gluc Sensor (FREESTYLE LIBRE 2 SENSOR) MISC, 1 each by Does not apply route daily., Disp: 2 each, Rfl: 6   cyclobenzaprine (FLEXERIL) 5 MG tablet, TAKE ONE TABLET BY MOUTH EVERY EVENING, Disp: 90 tablet, Rfl: 1   dronedarone (MULTAQ) 400 MG tablet, TAKE ONE TABLET BY MOUTH AT  NOON and TAKE ONE TABLET BY MOUTH EVERY EVENING, Disp: 180 tablet, Rfl: 2   ezetimibe (ZETIA) 10 MG tablet, TAKE ONE TABLET BY MOUTH ONCE DAILY, Disp: 90 tablet, Rfl: 1   hydrochlorothiazide (HYDRODIURIL) 25 MG tablet, TAKE ONE TABLET BY MOUTH ONCE DAILY, Disp: 90 tablet, Rfl: 1   HYDROcodone-acetaminophen (NORCO) 10-325 MG tablet, Take 1 tablet by mouth every 6 (six) hours as needed for pain., Disp: , Rfl:    lactulose, encephalopathy, (ENULOSE) 10 GM/15ML SOLN, take 64ms BY MOUTH three times daily AS NEEDED FOR mild constipation, Disp: 473 mL, Rfl: 1  LINZESS 145 MCG CAPS capsule, TAKE ONE CAPSULE BY MOUTH BEFORE BREAKFAST, Disp: 30 capsule, Rfl: 2   losartan (COZAAR) 100 MG tablet, TAKE ONE TABLET BY MOUTH EVERY EVENING, Disp: 90 tablet, Rfl: 1   metFORMIN (GLUCOPHAGE-XR) 500 MG 24 hr tablet, TAKE ONE TABLET BY MOUTH EVERY MORNING, Disp: 90 tablet, Rfl: 1   Pirfenidone 801 MG TABS, TAKE 1 TABLET BY MOUTH 3 TIMES A DAY, Disp: 180 tablet, Rfl: 0   XARELTO 20 MG TABS tablet, TAKE ONE TABLET BY MOUTH EVERY EVENING, Disp: 90 tablet, Rfl: 1  Review of Systems:  Constitutional: Denies fever, chills, diaphoresis, appetite change and fatigue.  HEENT: Denies photophobia, eye pain, redness, hearing loss, ear pain, congestion, sore throat, rhinorrhea, sneezing, mouth sores, trouble swallowing, neck pain, neck stiffness and tinnitus.   Respiratory: Denies SOB, DOE, cough, chest tightness,  and wheezing.   Cardiovascular: Denies chest pain, palpitations and leg swelling.  Gastrointestinal: Denies nausea, vomiting, abdominal pain, diarrhea, blood in stool and abdominal distention.  Genitourinary: Denies dysuria, urgency, frequency, hematuria, flank pain and difficulty urinating.  Endocrine: Denies: hot or cold intolerance, sweats, changes in hair or nails, polyuria, polydipsia. Musculoskeletal: Denies myalgias, back pain, joint swelling, arthralgias and gait problem.  Skin: Denies pallor, rash and wound.   Neurological: Denies dizziness, seizures, syncope, weakness, light-headedness, numbness and headaches.  Hematological: Denies adenopathy. Easy bruising, personal or family bleeding history  Psychiatric/Behavioral: Denies suicidal ideation, mood changes, confusion, nervousness, sleep disturbance and agitation    Physical Exam: Vitals:   01/20/22 1418 01/20/22 1422  BP: (!) 160/70 128/63  Pulse: 65   Temp: 98.4 F (36.9 C)   TempSrc: Oral   SpO2: 99%   Weight: 178 lb 1.6 oz (80.8 kg)     Body mass index is 28.75 kg/m.   Constitutional: NAD, calm, comfortable Eyes: PERRL, lids and conjunctivae normal ENMT: Mucous membranes are moist.  Respiratory: clear to auscultation bilaterally, no wheezing, no crackles. Normal respiratory effort. No accessory muscle use.  Cardiovascular: Regular rate and rhythm, no murmurs / rubs / gallops. No extremity edema.  Psychiatric: Normal judgment and insight. Alert and oriented x 3. Normal mood.    Impression and Plan:  Type 2 diabetes mellitus with other specified complication, without long-term current use of insulin ([place]) - Plan: POCT glycosylated hemoglobin (Hb A1C)  Needs flu shot - Plan: Flu Vaccine QUAD High Dose(Fluad)  Primary hypertension  Dyslipidemia  Constipation, unspecified constipation type - Plan: Ambulatory referral to Gynecology  Atrial fibrillation with RVR ([place])  ILD (interstitial lung disease) ([place])  -Blood pressure was initially elevated but towards the end of the office visit has normalized. -Flu vaccine administered today. -A1c of 5.8 demonstrates excellent glycemic control. -She continues to complain of significant constipation despite use of Linzess and MiraLAX daily.  After reviewing GI evaluation, they had recommended a GYN evaluation to rule out possibility of pelvic floor dysfunction worsening constipation.  GYN referral placed today. -She has recently seen pulmonary in regards to her ILD/IPF which is  stable. -She also recently had a visit with cardiology in regards to her A-fib and everything is stable.  She is anticoagulated on Xarelto.  Time spent:32 minutes reviewing chart, interviewing and examining patient and formulating plan of care.       [name], MD [facility]

## 2022-01-23 ENCOUNTER — Telehealth: Payer: Self-pay | Admitting: Licensed Clinical Social Worker

## 2022-01-23 ENCOUNTER — Encounter: Payer: Self-pay | Admitting: Licensed Clinical Social Worker

## 2022-01-23 NOTE — Patient Outreach (Signed)
  Care Coordination   01/23/2022 Name: Miranda [name] MRN: [id number] DOB: 1946-03-16   Care Coordination Outreach Attempts:  An unsuccessful telephone outreach was attempted for a scheduled appointment today.  Follow Up Plan:  Additional outreach attempts will be made to offer the patient care coordination information and services.   Encounter Outcome:  No Answer  Care Coordination Interventions Activated:  No   Care Coordination Interventions:  No, not indicated    Christa See, MSW, [email or web address] Phone [phone] 1:06 PM

## 2022-01-26 ENCOUNTER — Other Ambulatory Visit: Payer: Self-pay | Admitting: Pulmonary Disease

## 2022-01-26 DIAGNOSIS — J84112 Idiopathic pulmonary fibrosis: Secondary | ICD-10-CM

## 2022-01-26 NOTE — Telephone Encounter (Signed)
Rx for pirfenidone 801 mg three times daily sent to [facility], PharmD, MPH, BCPS, CPP Clinical Pharmacist (Rheumatology and Pulmonology)

## 2022-01-29 ENCOUNTER — Telehealth: Payer: Self-pay | Admitting: Internal Medicine

## 2022-01-29 DIAGNOSIS — Z124 Encounter for screening for malignant neoplasm of cervix: Secondary | ICD-10-CM

## 2022-01-29 NOTE — Telephone Encounter (Signed)
Left message on machine for patient to return our call 

## 2022-01-29 NOTE — Telephone Encounter (Signed)
Pt called to say she called [facility] and they told her that Dr. Sabra Heck has no availability until [facility] 2024. Pt was also told Dr. Sabra Heck is the only provider there.  Pt would like to know if MD could refer her to see someone else, before that?  Please advise.

## 2022-02-02 ENCOUNTER — Other Ambulatory Visit: Payer: Self-pay | Admitting: Internal Medicine

## 2022-02-02 ENCOUNTER — Telehealth: Payer: Self-pay | Admitting: Pharmacist

## 2022-02-02 NOTE — Progress Notes (Signed)
Chronic Care Management Pharmacy Assistant   Name: Miranda [name]  MRN: [id number] DOB: 1945/09/30  Reason for Encounter: Medication Coordination/ Medication Delivery  Recent office visits:  01/20/22 Miranda [name], Miranda Halsted, MD - Patient presented for Type 2 diabetes mellitus with other specified complication without long term current use of insulin and other concerns. No medication changes.   Recent consult visits:  None  Hospital visits:  None in previous 6 months  Medications: Outpatient Encounter Medications as of 02/02/2022  Medication Sig Note   Accu-Chek Softclix Lancets lancets Use as instructed    acetaminophen (TYLENOL) 325 MG tablet Take 650 mg by mouth every 6 (six) hours as needed for moderate pain. 11/26/2021: As needed   albuterol (VENTOLIN HFA) 108 (90 Base) MCG/ACT inhaler Inhale 1 puff into the lungs every 6 (six) hours as needed. 11/26/2021: As needes   ALPRAZolam (XANAX) 0.25 MG tablet TAKE ONE TABLET BY MOUTH three times daily AS NEEDED    atorvastatin (LIPITOR) 80 MG tablet TAKE ONE TABLET BY MOUTH EVERY EVENING    Continuous Blood Gluc Receiver (FREESTYLE LIBRE 2 READER) DEVI 1 each by Does not apply route daily.    Continuous Blood Gluc Sensor (FREESTYLE LIBRE 2 SENSOR) MISC 1 each by Does not apply route daily.    cyclobenzaprine (FLEXERIL) 5 MG tablet TAKE ONE TABLET BY MOUTH EVERY EVENING    dronedarone (MULTAQ) 400 MG tablet TAKE ONE TABLET BY MOUTH AT NOON and TAKE ONE TABLET BY MOUTH EVERY EVENING    ezetimibe (ZETIA) 10 MG tablet TAKE ONE TABLET BY MOUTH ONCE DAILY    hydrochlorothiazide (HYDRODIURIL) 25 MG tablet TAKE ONE TABLET BY MOUTH ONCE DAILY    HYDROcodone-acetaminophen (NORCO) 10-325 MG tablet Take 1 tablet by mouth every 6 (six) hours as needed for pain.    lactulose, encephalopathy, (ENULOSE) 10 GM/15ML SOLN take 74ms BY MOUTH three times daily AS NEEDED FOR mild constipation 11/26/2021: As needed   LINZESS 145 MCG CAPS capsule TAKE ONE  CAPSULE BY MOUTH BEFORE BREAKFAST    losartan (COZAAR) 100 MG tablet TAKE ONE TABLET BY MOUTH EVERY EVENING    metFORMIN (GLUCOPHAGE-XR) 500 MG 24 hr tablet TAKE ONE TABLET BY MOUTH EVERY MORNING    Pirfenidone 801 MG TABS TAKE 1 TABLET BY MOUTH 3 TIMES A DAY    XARELTO 20 MG TABS tablet TAKE ONE TABLET BY MOUTH EVERY EVENING    No facility-administered encounter medications on file as of 02/02/2022.   Reviewed chart for medication changes ahead of medication coordination call.  BP Readings from Last 3 Encounters:  01/20/22 128/63  11/26/21 (!) 140/64  11/12/21 138/62    Lab Results  Component Value Date   HGBA1C 5.8 (A) 01/20/2022     Patient obtains medications through Adherence Packaging  30 Days   Last adherence delivery included:  Hydrochlorothiazide 25 mg : take one tab at breakfast Losartan Potassium (Cozaar) 100 mg :  take one tab at evening meal Cyclobenzaprine (Flexiril)  5 mg : take one tab at evening meal Atorvastatin (Lipitor) 80 mg : take one tab at evening meal Linzess 145 mcg Take one capsule before breakfast Xarelto 20 mg : take one tab at evening meal Dronedarone (Multaq) 400 mg : take one tab at lunch and one at evening meal Ezetimbe (Zetia) 10 mg : take one tab at breakfast Alprazolam (Xanax) 0.25 mg : Take 1 tab 3 times daily as needed Docusate Breakfast and DPaysonsent to patient on  12/23/21 to ship again on 01/23/22 patient made aware.       Patient needs refills for  Metformin, patient requested vial as she does not take daily, added to form.   Confirmed delivery date of 01/15/22, advised patient that pharmacy will contact them the morning of delivery.   Patient is due for next adherence delivery on: 02/16/22. Called patient and reviewed medications and coordinated delivery. Packs 30 DS  This delivery to include:  Hydrochlorothiazide 25 mg : take one tab at breakfast Losartan Potassium (Cozaar) 100 mg :  take one tab at evening  meal Cyclobenzaprine (Flexiril)  5 mg : take one tab at evening meal Atorvastatin (Lipitor) 80 mg : take one tab at evening meal Linzess 145 mcg Take one capsule before breakfast Xarelto 20 mg : take one tab at evening meal Dronedarone (Multaq) 400 mg : take one tab at lunch and one at evening meal Ezetimbe (Zetia) 10 mg : take one tab at breakfast Alprazolam (Xanax) 0.25 mg : Take 1 tab 3 times daily as needed Docusate Breakfast and Dinner  Metformin   Freestyle Sensors (patient has none last deliv 10/13 scheduled for 11/21 pharmacy reports they can not bill before then)    Confirmed delivery date of 02/16/22, advised patient that pharmacy will contact them the morning of delivery.   Care Gaps: Eye Exam - Overdue Hepatiis C Screening - Overdue Zoster Vaccine - Overdue Foot Exam - Overdue PNA Vaccine - Overdue COVID Booster - Overdue BP- 126/63 01/20/22 CCM- 11/21 MSG sent to Miranda [name] to schedule.  Lab Results  Component Value Date   HGBA1C 5.8 (A) 01/20/2022    Star Rating Drugs: Metformin (Glucophage XR) 500 mg - Last filled 01/13/22 30 DS at [name]  Losartan (Cozaar) 100 mg - Last filled 01/13/2022 30 DS at [name] Atorvastatin (Lipitor) 80 mg - Last filled 01/13/2022 30 DS at [facility] Pharmacist Assistant [phone]

## 2022-02-03 NOTE — Telephone Encounter (Signed)
Referral placed.

## 2022-02-03 NOTE — Addendum Note (Signed)
Addended by: Westley Hummer B on: 02/03/2022 01:54 PM   Modules accepted: Orders

## 2022-02-12 ENCOUNTER — Telehealth: Payer: Self-pay | Admitting: Internal Medicine

## 2022-02-12 NOTE — Telephone Encounter (Signed)
Patient has been scheduled for her AWV with Dr.Hernandez. Nothing further needed at this time.        FYI

## 2022-02-19 ENCOUNTER — Ambulatory Visit (INDEPENDENT_AMBULATORY_CARE_PROVIDER_SITE_OTHER): Payer: HMO | Admitting: Internal Medicine

## 2022-02-19 ENCOUNTER — Encounter: Payer: Self-pay | Admitting: Internal Medicine

## 2022-02-19 VITALS — BP 130/80 | HR 59 | Temp 97.6°F | Ht 65.0 in | Wt 180.5 lb

## 2022-02-19 DIAGNOSIS — E785 Hyperlipidemia, unspecified: Secondary | ICD-10-CM | POA: Diagnosis not present

## 2022-02-19 DIAGNOSIS — E559 Vitamin D deficiency, unspecified: Secondary | ICD-10-CM | POA: Diagnosis not present

## 2022-02-19 DIAGNOSIS — Z13820 Encounter for screening for osteoporosis: Secondary | ICD-10-CM | POA: Diagnosis not present

## 2022-02-19 DIAGNOSIS — Z0000 Encounter for general adult medical examination without abnormal findings: Secondary | ICD-10-CM

## 2022-02-19 DIAGNOSIS — E119 Type 2 diabetes mellitus without complications: Secondary | ICD-10-CM | POA: Diagnosis not present

## 2022-02-19 DIAGNOSIS — I10 Essential (primary) hypertension: Secondary | ICD-10-CM | POA: Diagnosis not present

## 2022-02-19 DIAGNOSIS — J84112 Idiopathic pulmonary fibrosis: Secondary | ICD-10-CM

## 2022-02-19 DIAGNOSIS — Z23 Encounter for immunization: Secondary | ICD-10-CM | POA: Diagnosis not present

## 2022-02-19 DIAGNOSIS — G4733 Obstructive sleep apnea (adult) (pediatric): Secondary | ICD-10-CM

## 2022-02-19 DIAGNOSIS — Z124 Encounter for screening for malignant neoplasm of cervix: Secondary | ICD-10-CM

## 2022-02-19 LAB — COMPREHENSIVE METABOLIC PANEL
ALT: 30 U/L (ref 0–35)
AST: 25 U/L (ref 0–37)
Albumin: 4.3 g/dL (ref 3.5–5.2)
Alkaline Phosphatase: 35 U/L — ABNORMAL LOW (ref 39–117)
BUN: 19 mg/dL (ref 6–23)
CO2: 33 mEq/L — ABNORMAL HIGH (ref 19–32)
Calcium: 9.8 mg/dL (ref 8.4–10.5)
Chloride: 101 mEq/L (ref 96–112)
Creatinine, Ser: 1.01 mg/dL (ref 0.40–1.20)
GFR: 54.5 mL/min — ABNORMAL LOW (ref >60.00)
Glucose, Bld: 94 mg/dL (ref 70–99)
Potassium: 3.9 mEq/L (ref 3.5–5.1)
Sodium: 139 mEq/L (ref 135–145)
Total Bilirubin: 0.5 mg/dL (ref 0.2–1.2)
Total Protein: 7.6 g/dL (ref 6.0–8.3)

## 2022-02-19 LAB — HEMOGLOBIN A1C: Hgb A1c MFr Bld: 6.6 % — ABNORMAL HIGH (ref 4.6–6.5)

## 2022-02-19 LAB — LIPID PANEL
Cholesterol: 145 mg/dL (ref 0–200)
HDL: 72.6 mg/dL (ref >39.00)
LDL Cholesterol: 60 mg/dL (ref 0–99)
NonHDL: 72.33
Total CHOL/HDL Ratio: 2
Triglycerides: 60 mg/dL (ref 0.0–149.0)
VLDL: 12 mg/dL (ref 0.0–40.0)

## 2022-02-19 LAB — CBC WITH DIFFERENTIAL/PLATELET
Basophils Absolute: 0 10*3/uL (ref 0.0–0.1)
Basophils Relative: 0.2 % (ref 0.0–3.0)
Eosinophils Absolute: 0.3 10*3/uL (ref 0.0–0.7)
Eosinophils Relative: 5.9 % — ABNORMAL HIGH (ref 0.0–5.0)
HCT: 38.1 % (ref 36.0–46.0)
Hemoglobin: 12.8 g/dL (ref 12.0–15.0)
Lymphocytes Relative: 36.7 % (ref 12.0–46.0)
Lymphs Abs: 2.1 10*3/uL (ref 0.7–4.0)
MCHC: 33.5 g/dL (ref 30.0–36.0)
MCV: 85.2 fl (ref 78.0–100.0)
Monocytes Absolute: 0.9 10*3/uL (ref 0.1–1.0)
Monocytes Relative: 16.7 % — ABNORMAL HIGH (ref 3.0–12.0)
Neutro Abs: 2.3 10*3/uL (ref 1.4–7.7)
Neutrophils Relative %: 40.5 % — ABNORMAL LOW (ref 43.0–77.0)
Platelets: 175 10*3/uL (ref 150.0–400.0)
RBC: 4.47 Mil/uL (ref 3.87–5.11)
RDW: 13.9 % (ref 11.5–15.5)
WBC: 5.7 10*3/uL (ref 4.0–10.5)

## 2022-02-19 LAB — VITAMIN D 25 HYDROXY (VIT D DEFICIENCY, FRACTURES): VITD: 32.63 ng/mL (ref 30.00–100.00)

## 2022-02-19 NOTE — Patient Instructions (Signed)
-  Nice seeing you today!!  -Lab work today; will notify you once results are available.  -Pneumonia vaccine today.  -Remember to updated: COVID, RSV, tdap and shingles vaccines at your pharmacy.  -Schedule follow up in3-4 months.

## 2022-02-19 NOTE — Progress Notes (Signed)
Established Patient Office Visit     CC/Reason for Visit: Annual preventive exam and subsequent Medicare wellness visit  HPI: Miranda [name] is a 76 y.o. female who is coming in today for the above mentioned reasons. Past Medical History is significant for: Hypertension, hyperlipidemia, type 2 diabetes, idiopathic pulmonary fibrosis/interstitial lung disease, paroxysmal atrial fibrillation.  She has no acute concerns or complaints, she is overdue for a dental exam, she had an eye exam over the summer.  She is due for COVID, pneumonia, RSV, Tdap, shingles vaccines.  She is overdue for GYN exam and DEXA.   Past Medical/Surgical History: Past Medical History:  Diagnosis Date   Anxiety    Arthritis    Asthma    Atrial fibrillation (HCC)    CHF (congestive heart failure) ([place])    pt. unsure- but thinks she was hosp. for CHF- 2002   [facility]   Clotting disorder ([place])    blood clots in lungs/PE pulmonary embolism   Colon polyps    Complication of anesthesia    states requires a lot med. to put her to sleep    DDD (degenerative disc disease) 09/17/2011   Depression    "sometimes "   DM (diabetes mellitus) ([place])    Family history of anesthesia complication    Sister had difficulty waking up from anesthesia   GERD (gastroesophageal reflux disease)    Glaucoma    bilateral, pt. admits that she is noncompliant to eye gtts.    Hemorrhoids    Hyperlipidemia    Hypertension    had stress, echo- 2006 /w [place], Cardiac Cath, per pt. 2002, echo repeated 2012- wnl    IPF (idiopathic pulmonary fibrosis) (HCC)    Low back pain    Shortness of breath    Sleep apnea    uses c-pap- q night recently    Past Surgical History:  Procedure Laterality Date   ABDOMINAL HYSTERECTOMY     ectopic, fibroids   ANTERIOR CERVICAL DECOMP/DISCECTOMY FUSION N/A 09/11/2020   Procedure: Anterior Cervical Decompression Fusion Cervical four-five, Cervical  five-six;  Surgeon: [name] Mare, MD;  Location: [facility];  Service: Neurosurgery;  Laterality: N/A;   arm surgery Right    CARDIAC CATHETERIZATION     CATARACT EXTRACTION, BILATERAL     cataracts removed bilateral- ?IOL   COLONOSCOPY     remote   FISSURECTOMY  10/08/2011   Procedure: FISSURECTOMY;  Surgeon: Stark Klein, MD;  Location: [facility];  Service: General;  Laterality: N/A;   FLEXIBLE SIGMOIDOSCOPY  02/25/2011   Procedure: FLEXIBLE SIGMOIDOSCOPY;  Surgeon: Inda Castle, MD;  Location: [facility];  Service: Endoscopy;  Laterality: N/A;   FOOT SURGERY     bilat, heel spurs- screw in R foot    HEMORRHOID SURGERY  10/08/2011   Procedure: HEMORRHOIDECTOMY;  Surgeon: Stark Klein, MD;  Location: [name];  Service: General;  Laterality: N/A;  External    ROTATOR CUFF REPAIR Right    SPHINCTEROTOMY  10/08/2011   Procedure: Joan Mayans;  Surgeon: Stark Klein, MD;  Location: [facility];  Service: General;  Laterality: N/A;    Social History:  reports that she has never smoked. She has never been exposed to tobacco smoke. She has never used smokeless tobacco. She reports that she does not drink alcohol and does not use drugs.  Allergies: Allergies  Allergen Reactions   Fish Oil Anaphylaxis   Other Hives, Shortness Of Breath and  Swelling    Allergic to cashew nuts and peanut oil.   Penicillins Anaphylaxis, Hives and Swelling    Has patient had a PCN reaction causing immediate rash, facial/tongue/throat swelling, SOB or lightheadedness with hypotension: Face swelling and hives started first, then swelling of the throat  Has patient had a PCN reaction causing severe rash involving mucus membranes or skin necrosis: Yes  Has patient had a PCN reaction that required hospitalization: No  Has patient had a PCN reaction occurring within the last 10 years: Yes  If all of the above answers are "NO", then may proceed with Cephalospor   Pneumococcal Vaccines Nausea And Vomiting   Cashew Nut  Oil    Nitrofurantoin Macrocrystal Hives   Peanut Oil    Aspirin Itching and Rash   Bactrim [Sulfamethoxazole-Trimethoprim] Hives, Itching and Rash   Ciprofloxacin Hives, Itching and Rash   Ibuprofen Rash   Influenza Vaccines Hives   Ivp Dye [Iodinated Contrast Media] Hives, Itching and Rash    Gives benadryl to counteract symptoms   Latex Rash   Macrobid [Nitrofurantoin Monohydrate Macrocrystals] Hives   Shellfish Allergy Hives    Patient also allergic to seafood    Family History:  Family History  Problem Relation Age of Onset   Heart attack Mother    Heart disease Mother    Breast cancer Mother    Emphysema Sister    Breast cancer Sister    Arthritis/Rheumatoid Sister    Asthma Sister    Lung cancer Sister    COPD Sister    Colon cancer Brother    Colon cancer Brother    Anesthesia problems Neg Hx    Esophageal cancer Neg Hx    Rectal cancer Neg Hx    Stomach cancer Neg Hx      Current Outpatient Medications:    Accu-Chek Softclix Lancets lancets, Use as instructed, Disp: 100 each, Rfl: 12   acetaminophen (TYLENOL) 325 MG tablet, Take 650 mg by mouth every 6 (six) hours as needed for moderate pain., Disp: , Rfl:    albuterol (VENTOLIN HFA) 108 (90 Base) MCG/ACT inhaler, Inhale 1 puff into the lungs every 6 (six) hours as needed., Disp: 18 g, Rfl: 1   ALPRAZolam (XANAX) 0.25 MG tablet, TAKE ONE TABLET BY MOUTH three times daily AS NEEDED, Disp: 90 tablet, Rfl: 2   atorvastatin (LIPITOR) 80 MG tablet, TAKE ONE TABLET BY MOUTH EVERY EVENING, Disp: 90 tablet, Rfl: 1   Continuous Blood Gluc Receiver (FREESTYLE LIBRE 2 READER) DEVI, 1 each by Does not apply route daily., Disp: 2 each, Rfl: 6   Continuous Blood Gluc Sensor (FREESTYLE LIBRE 2 SENSOR) MISC, 1 each by Does not apply route daily., Disp: 2 each, Rfl: 6   cyclobenzaprine (FLEXERIL) 5 MG tablet, TAKE ONE TABLET BY MOUTH EVERY EVENING, Disp: 90 tablet, Rfl: 1   dronedarone (MULTAQ) 400 MG tablet, TAKE ONE TABLET BY  MOUTH AT NOON and TAKE ONE TABLET BY MOUTH EVERY EVENING, Disp: 180 tablet, Rfl: 2   ezetimibe (ZETIA) 10 MG tablet, TAKE ONE TABLET BY MOUTH ONCE DAILY, Disp: 90 tablet, Rfl: 1   hydrochlorothiazide (HYDRODIURIL) 25 MG tablet, TAKE ONE TABLET BY MOUTH ONCE DAILY, Disp: 90 tablet, Rfl: 1   HYDROcodone-acetaminophen (NORCO) 10-325 MG tablet, Take 1 tablet by mouth every 6 (six) hours as needed for pain., Disp: , Rfl:    lactulose, encephalopathy, (ENULOSE) 10 GM/15ML SOLN, take 35ms BY MOUTH three times daily AS NEEDED FOR mild constipation, Disp: 473 mL,  Rfl: 1   LINZESS 145 MCG CAPS capsule, TAKE ONE CAPSULE BY MOUTH BEFORE BREAKFAST, Disp: 30 capsule, Rfl: 2   losartan (COZAAR) 100 MG tablet, TAKE ONE TABLET BY MOUTH EVERY EVENING, Disp: 90 tablet, Rfl: 1   metFORMIN (GLUCOPHAGE-XR) 500 MG 24 hr tablet, TAKE ONE TABLET BY MOUTH EVERY MORNING, Disp: 90 tablet, Rfl: 1   Pirfenidone 801 MG TABS, TAKE 1 TABLET BY MOUTH 3 TIMES A DAY, Disp: 180 tablet, Rfl: 0   XARELTO 20 MG TABS tablet, TAKE ONE TABLET BY MOUTH EVERY EVENING, Disp: 90 tablet, Rfl: 1  Review of Systems:  Constitutional: Denies fever, chills, diaphoresis, appetite change and fatigue.  HEENT: Denies photophobia, eye pain, redness, hearing loss, ear pain, congestion, sore throat, rhinorrhea, sneezing, mouth sores, trouble swallowing, neck pain, neck stiffness and tinnitus.   Respiratory: Denies SOB, DOE, cough, chest tightness,  and wheezing.   Cardiovascular: Denies chest pain, palpitations and leg swelling.  Gastrointestinal: Denies nausea, vomiting, abdominal pain, diarrhea, constipation, blood in stool and abdominal distention.  Genitourinary: Denies dysuria, urgency, frequency, hematuria, flank pain and difficulty urinating.  Endocrine: Denies: hot or cold intolerance, sweats, changes in hair or nails, polyuria, polydipsia. Musculoskeletal: Denies myalgias, back pain, joint swelling, arthralgias and gait problem.  Skin: Denies  pallor, rash and wound.  Neurological: Denies dizziness, seizures, syncope, weakness, light-headedness, numbness and headaches.  Hematological: Denies adenopathy. Easy bruising, personal or family bleeding history  Psychiatric/Behavioral: Denies suicidal ideation, mood changes, confusion, nervousness, sleep disturbance and agitation    Physical Exam: Vitals:   02/19/22 1317  BP: 130/80  Pulse: (!) 59  Temp: 97.6 F (36.4 C)  TempSrc: Oral  SpO2: 99%  Weight: 180 lb 8 oz (81.9 kg)  Height: '5\' 5"'$  (1.651 m)    Body mass index is 30.04 kg/m.   Constitutional: NAD, calm, comfortable Eyes: PERRL, lids and conjunctivae normal ENMT: Mucous membranes are moist. Posterior pharynx clear of any exudate or lesions. Normal dentition. Tympanic membrane is pearly white, no erythema or bulging. Neck: normal, supple, no masses, no thyromegaly Respiratory: clear to auscultation bilaterally, no wheezing, no crackles. Normal respiratory effort. No accessory muscle use.  Cardiovascular: Regular rate and rhythm, no murmurs / rubs / gallops. No extremity edema. 2+ pedal pulses. No carotid bruits.  Abdomen: no tenderness, no masses palpated. No hepatosplenomegaly. Bowel sounds positive.  Musculoskeletal: no clubbing / cyanosis. No joint deformity upper and lower extremities. Good ROM, no contractures. Normal muscle tone.  Skin: no rashes, lesions, ulcers. No induration Neurologic: CN 2-12 grossly intact. Sensation intact, DTR normal. Strength 5/5 in all 4.  Psychiatric: Normal judgment and insight. Alert and oriented x 3. Normal mood.   Subsequent Medicare wellness visit   1. Risk factors, based on past  M,S,F -cardiovascular disease risk factors include age, history of hypertension, hyperlipidemia and diabetes   2.  Physical activities: Very sedentary   3.  Depression/mood: Mood is stable   4.  Hearing: No perceived issues   5.  ADL's: Independent in all ADLs   6.  Fall risk: Low fall risk    7.  Home safety: No problems identified   8.  Height weight, and visual acuity: height and weight as above, vision:  Vision Screening   Right eye Left eye Both eyes  Without correction '20/40 20/40 20/40 '$  With correction        9.  Counseling: Advised to update age-appropriate immunizations and cancer screenings   10. Lab orders based on risk factors: Laboratory  update will be reviewed   11. Referral : GYN, DEXA   12. Care plan: Follow-up with me in 3 to 4 months   13. Cognitive assessment: No cognitive impairment   14. Screening: Patient provided with a written and personalized 5-10 year screening schedule in the AVS. yes   15. Provider List Update: PCP, cardiologist, pulmonologist  16. Advance Directives: Full code   17. Opioids: Patient is not on any opioid prescriptions and has no risk factors for a substance use disorder.   [facility] Office Visit from 02/19/2022 in [facility]  PHQ-9 Total Score 0          04/16/2021    6:00 PM 04/17/2021   12:00 AM 04/17/2021    2:00 PM 04/24/2021    2:41 PM 02/19/2022    1:26 PM  [place] in the past year?    1 0  Was there an injury with Fall?    0 0  Fall Risk Category Calculator    2 0  Fall Risk Category    Moderate Low  Patient Fall Risk Level Moderate fall risk Moderate fall risk Low fall risk  Low fall risk  Patient at Risk for Falls Due to     No Fall Risks  Fall risk Follow up     Falls evaluation completed      Impression and Plan:  Encounter for preventive health examination  Screening for osteoporosis - Plan: DG Bone Density  Primary hypertension - Plan: CBC with Differential/Platelet, Comprehensive metabolic panel  Diabetes mellitus without complication ([place]) - Plan: Hemoglobin A1c  Dyslipidemia - Plan: Lipid panel  Obstructive sleep apnea syndrome  IPF (idiopathic pulmonary fibrosis) (HCC)  Vitamin D deficiency - Plan: VITAMIN D 25 Hydroxy (Vit-D Deficiency,  Fractures)   -Recommend routine eye and dental care. -Immunizations: PCV 20 in office today, she will update COVID, RSV, Tdap, shingles at pharmacy -Healthy lifestyle discussed in detail. -Labs to be updated today. -Colon cancer screening: 11/2020 -Breast cancer screening: 04/2020 -Cervical cancer screening: Overdue, GYN referral placed -Lung cancer screening: Not applicable -Prostate cancer screening: Not applicable -DEXA: Overdue, referral placed    Patient Instructions  -Nice seeing you today!!  -Lab work today; will notify you once results are available.  -Pneumonia vaccine today.  -Remember to updated: COVID, RSV, tdap and shingles vaccines at your pharmacy.  -Schedule follow up in3-4 months.      [name], MD [facility]

## 2022-02-25 ENCOUNTER — Ambulatory Visit (INDEPENDENT_AMBULATORY_CARE_PROVIDER_SITE_OTHER)
Admission: RE | Admit: 2022-02-25 | Discharge: 2022-02-25 | Disposition: A | Discharge status: Home / Self Care | Payer: HMO | Source: Ambulatory Visit | Attending: Internal Medicine | Admitting: Internal Medicine

## 2022-02-25 DIAGNOSIS — Z13820 Encounter for screening for osteoporosis: Secondary | ICD-10-CM | POA: Diagnosis not present

## 2022-03-03 ENCOUNTER — Other Ambulatory Visit: Payer: Self-pay | Admitting: Internal Medicine

## 2022-03-03 DIAGNOSIS — M545 Low back pain, unspecified: Secondary | ICD-10-CM

## 2022-03-03 DIAGNOSIS — E785 Hyperlipidemia, unspecified: Secondary | ICD-10-CM

## 2022-03-03 DIAGNOSIS — G8929 Other chronic pain: Secondary | ICD-10-CM

## 2022-03-04 ENCOUNTER — Telehealth: Payer: Self-pay | Admitting: Pharmacist

## 2022-03-04 NOTE — Progress Notes (Signed)
Chronic Care Management Pharmacy Assistant   Name: Miranda [name]  MRN: [id number] DOB: September 05, 1945  Reason for Encounter: Medication Review & BP Assess   Conditions to be addressed/monitored: HTN  Recent office visits:  02/19/22 Miranda [name], Miranda Halsted, MD - Patient presented for Preventative health examination and other concerns. No medication changes.   Recent consult visits:  None   Hospital visits:  None in previous 6 months  Medications: Outpatient Encounter Medications as of 03/04/2022  Medication Sig Note   Accu-Chek Softclix Lancets lancets Use as instructed    acetaminophen (TYLENOL) 325 MG tablet Take 650 mg by mouth every 6 (six) hours as needed for moderate pain. 11/26/2021: As needed   albuterol (VENTOLIN HFA) 108 (90 Base) MCG/ACT inhaler Inhale 1 puff into the lungs every 6 (six) hours as needed. 11/26/2021: As needes   ALPRAZolam (XANAX) 0.25 MG tablet TAKE ONE TABLET BY MOUTH three times daily AS NEEDED    atorvastatin (LIPITOR) 80 MG tablet TAKE ONE TABLET BY MOUTH EVERY EVENING    Continuous Blood Gluc Receiver (FREESTYLE LIBRE 2 READER) DEVI 1 each by Does not apply route daily.    Continuous Blood Gluc Sensor (FREESTYLE LIBRE 2 SENSOR) MISC 1 each by Does not apply route daily.    cyclobenzaprine (FLEXERIL) 5 MG tablet TAKE ONE TABLET BY MOUTH EVERY EVENING    dronedarone (MULTAQ) 400 MG tablet TAKE ONE TABLET BY MOUTH AT NOON and TAKE ONE TABLET BY MOUTH EVERY EVENING    ezetimibe (ZETIA) 10 MG tablet TAKE ONE TABLET BY MOUTH ONCE DAILY    hydrochlorothiazide (HYDRODIURIL) 25 MG tablet TAKE ONE TABLET BY MOUTH ONCE DAILY    HYDROcodone-acetaminophen (NORCO) 10-325 MG tablet Take 1 tablet by mouth every 6 (six) hours as needed for pain.    lactulose, encephalopathy, (ENULOSE) 10 GM/15ML SOLN take 66ms BY MOUTH three times daily AS NEEDED FOR mild constipation 11/26/2021: As needed   LINZESS 145 MCG CAPS capsule TAKE ONE CAPSULE BY MOUTH BEFORE BREAKFAST     losartan (COZAAR) 100 MG tablet TAKE ONE TABLET BY MOUTH EVERY EVENING    metFORMIN (GLUCOPHAGE-XR) 500 MG 24 hr tablet TAKE ONE TABLET BY MOUTH EVERY MORNING    Pirfenidone 801 MG TABS TAKE 1 TABLET BY MOUTH 3 TIMES A DAY    XARELTO 20 MG TABS tablet TAKE ONE TABLET BY MOUTH EVERY EVENING    No facility-administered encounter medications on file as of 03/04/2022.   Reviewed chart for medication changes ahead of medication coordination call.  BP Readings from Last 3 Encounters:  02/19/22 130/80  01/20/22 128/63  11/26/21 (!) 140/64    Lab Results  Component Value Date   HGBA1C 6.6 (H) 02/19/2022     Patient obtains medications through Adherence Packaging  30 Days   Last adherence delivery included:  Hydrochlorothiazide 25 mg : take one tab at breakfast Losartan Potassium (Cozaar) 100 mg :  take one tab at evening meal Cyclobenzaprine (Flexiril)  5 mg : take one tab at evening meal Atorvastatin (Lipitor) 80 mg : take one tab at evening meal Linzess 145 mcg Take one capsule before breakfast Xarelto 20 mg : take one tab at evening meal Dronedarone (Multaq) 400 mg : take one tab at lunch and one at evening meal Ezetimbe (Zetia) 10 mg : take one tab at breakfast Alprazolam (Xanax) 0.25 mg : Take 1 tab 3 times daily as needed Docusate Breakfast and Dinner  Metformin     Freestyle Sensors (patient has  none last deliv 10/13 scheduled for 11/21 pharmacy reports they can not bill before then)  Confirmed delivery date of 02/16/22, advised patient that pharmacy will contact them the morning of delivery.       Patient is due for next adherence delivery on: 03/17/22. Called patient and reviewed medications and coordinated delivery. Packs 30 DS  This delivery to include: Hydrochlorothiazide 25 mg : take one tab at breakfast Losartan Potassium (Cozaar) 100 mg :  take one tab at evening meal Cyclobenzaprine (Flexiril)  5 mg : take one tab at evening meal Atorvastatin (Lipitor) 80 mg :  take one tab at evening meal Linzess 145 mcg Take one capsule before breakfast Xarelto 20 mg : take one tab at evening meal Dronedarone (Multaq) 400 mg : take one tab at lunch and one at evening meal Ezetimbe (Zetia) 10 mg : take one tab at breakfast Alprazolam (Xanax) 0.25 mg : Take 1 tab 3 times daily as needed Docusate Breakfast and Dinner Metformin     Patient needs refills for Freestyle sensors: Patient reports she has 3 days left on her current and last sensor coordinated delivery for 12/22.  Confirmed delivery date of 03/17/22, advised patient that pharmacy will contact them the morning of delivery.   Reviewed chart prior to disease state call. Spoke with patient regarding BP  Recent Office Vitals: BP Readings from Last 3 Encounters:  02/19/22 130/80  01/20/22 128/63  11/26/21 (!) 140/64   Pulse Readings from Last 3 Encounters:  02/19/22 (!) 59  01/20/22 65  11/26/21 (!) 55    Wt Readings from Last 3 Encounters:  02/19/22 180 lb 8 oz (81.9 kg)  01/20/22 178 lb 1.6 oz (80.8 kg)  11/26/21 180 lb (81.6 kg)     Kidney Function Lab Results  Component Value Date/Time   CREATININE 1.01 02/19/2022 02:04 PM   CREATININE 0.93 11/26/2021 03:23 PM   CREATININE 0.93 12/01/2019 10:25 AM   GFR 54.50 (L) 02/19/2022 02:04 PM   GFRNONAA 58 (L) 04/17/2021 06:15 AM   GFRAA >60 11/04/2017 04:10 AM       Latest Ref Rng & Units 02/19/2022    2:04 PM 11/26/2021    3:23 PM 09/18/2021   10:29 AM  BMP  Glucose 70 - 99 mg/dL 94   62   BUN 6 - 23 mg/dL '19  25  18   '$ Creatinine 0.40 - 1.20 mg/dL 1.01  0.93  0.94   Sodium 135 - 145 mEq/L 139   134   Potassium 3.5 - 5.1 mEq/L 3.9   4.0   Chloride 96 - 112 mEq/L 101   101   CO2 19 - 32 mEq/L 33   31   Calcium 8.4 - 10.5 mg/dL 9.8   10.4     Current antihypertensive regimen:  HCTZ 25 mg 1 tablet daily - Appropriate, Query effective, Safe, Accessible Losartan 100 mg 1 tablet daily - Appropriate, Query effective, Safe, Accessible How  often are you checking your Blood Pressure? 3-5x per week Current home BP readings:  BP Readings from Last 3 Encounters:  02/19/22 130/80  01/20/22 128/63  11/26/21 (!) 140/64   Patient reports her pressures have been doing ok, she denies any hyper/hypotensive symptoms. She states she has been feeling like she is catching a cold and thinks she had a fever but is gone now. Advised her to call over to the office for an appointment she reports she will consider if she isnt feeling much better after some OTC  cold medication.   Adherence Review: Is the patient currently on ACE/ARB medication? Yes Does the patient have >5 day gap between last estimated fill dates? No     Care Gaps: Eye Exam - Overdue Hepatitis C Overdue TDAP - Overdue Foot Exam - Overdue AWV-02/19/22 COVID Booster - Postponed Zoster Vaccine - Postponed Lab Results  Component Value Date   HGBA1C 6.6 (H) 02/19/2022    Star Rating Drugs: Metformin (Glucophage XR) 500 mg - Last filled 02/11/22 30 DS at [name]  Losartan (Cozaar) 100 mg - Last filled 02/11/2022 30 DS at [name] Atorvastatin (Lipitor) 80 mg - Last filled 02/11/2022 30 DS at [facility] Pharmacist Assistant [phone]

## 2022-03-27 ENCOUNTER — Other Ambulatory Visit: Payer: Self-pay | Admitting: Internal Medicine

## 2022-03-27 DIAGNOSIS — J84112 Idiopathic pulmonary fibrosis: Secondary | ICD-10-CM

## 2022-03-27 DIAGNOSIS — I10 Essential (primary) hypertension: Secondary | ICD-10-CM

## 2022-04-01 ENCOUNTER — Telehealth: Payer: Self-pay | Admitting: Internal Medicine

## 2022-04-01 NOTE — Telephone Encounter (Signed)
Prescription Request  04/01/2022  Is this a "Controlled Substance" medicine? No  LOV: 02/19/2022  What is the name of the medication or equipment? Continuous Blood Gluc Sensor (FREESTYLE LIBRE 2 SENSOR) MISC , lactulose, encephalopathy, (ENULOSE) 10 GM/15ML SOLN   Have you contacted your pharmacy to request a refill? No   Which pharmacy would you like this sent to?   [name] - [place], [place] - [address] Phone: [phone]  Fax: [phone]      Patient notified that their request is being sent to the clinical staff for review and that they should receive a response within 2 business days.   Please advise at [facility] [phone]

## 2022-04-02 ENCOUNTER — Telehealth: Payer: Self-pay | Admitting: *Deleted

## 2022-04-02 NOTE — Telephone Encounter (Signed)
TC from pt with concerns about upcoming GYN exam. Expected procedures discussed and patient offered emotional support and reassurance. Pt will bring support person. Pt voiced reassurance. All questions were answered.

## 2022-04-03 ENCOUNTER — Telehealth: Payer: Self-pay | Admitting: Gastroenterology

## 2022-04-03 ENCOUNTER — Other Ambulatory Visit: Payer: Self-pay

## 2022-04-03 ENCOUNTER — Telehealth: Payer: Self-pay | Admitting: Pharmacist

## 2022-04-03 MED ORDER — LINACLOTIDE 145 MCG PO CAPS: ORAL_CAPSULE | ORAL | 2 refills | Status: DC

## 2022-04-03 NOTE — Progress Notes (Signed)
Care Coordination Pharmacy Assistant   Patient ID: Miranda [name], female   DOB: September 01, 1945, 77 y.o.   MRN: [id number]  Reason for Encounter: Medication Review Medication Coordination    Recent office visits:  None  Recent consult visits:  None  Hospital visits:  None in previous 6 months  Medications: Outpatient Encounter Medications as of 04/03/2022  Medication Sig Note   Accu-Chek Softclix Lancets lancets Use as instructed    acetaminophen (TYLENOL) 325 MG tablet Take 650 mg by mouth every 6 (six) hours as needed for moderate pain. 11/26/2021: As needed   albuterol (VENTOLIN HFA) 108 (90 Base) MCG/ACT inhaler Inhale 1 puff into the lungs every 6 (six) hours as needed. 11/26/2021: As needes   ALPRAZolam (XANAX) 0.25 MG tablet TAKE ONE TABLET BY MOUTH three times daily AS NEEDED    atorvastatin (LIPITOR) 80 MG tablet TAKE ONE TABLET BY MOUTH EVERY EVENING    Continuous Blood Gluc Receiver (FREESTYLE LIBRE 2 READER) DEVI 1 each by Does not apply route daily.    Continuous Blood Gluc Sensor (FREESTYLE LIBRE 2 SENSOR) MISC 1 each by Does not apply route daily.    cyclobenzaprine (FLEXERIL) 5 MG tablet TAKE ONE TABLET BY MOUTH EVERY EVENING    dronedarone (MULTAQ) 400 MG tablet TAKE ONE TABLET BY MOUTH AT NOON and TAKE ONE TABLET BY MOUTH EVERY EVENING    ezetimibe (ZETIA) 10 MG tablet TAKE ONE TABLET BY MOUTH ONCE DAILY    hydrochlorothiazide (HYDRODIURIL) 25 MG tablet TAKE ONE TABLET BY MOUTH ONCE DAILY    HYDROcodone-acetaminophen (NORCO) 10-325 MG tablet Take 1 tablet by mouth every 6 (six) hours as needed for pain.    lactulose, encephalopathy, (ENULOSE) 10 GM/15ML SOLN take 23ms BY MOUTH three times daily AS NEEDED FOR mild constipation 11/26/2021: As needed   LINZESS 145 MCG CAPS capsule TAKE ONE CAPSULE BY MOUTH BEFORE BREAKFAST    losartan (COZAAR) 100 MG tablet TAKE ONE TABLET BY MOUTH EVERY EVENING    metFORMIN (GLUCOPHAGE-XR) 500 MG 24 hr tablet TAKE ONE TABLET BY MOUTH EVERY  MORNING    Pirfenidone 801 MG TABS TAKE 1 TABLET BY MOUTH 3 TIMES A DAY    XARELTO 20 MG TABS tablet TAKE ONE TABLET BY MOUTH EVERY EVENING    No facility-administered encounter medications on file as of 04/03/2022.   Reviewed chart for medication changes ahead of medication coordination call.  BP Readings from Last 3 Encounters:  02/19/22 130/80  01/20/22 128/63  11/26/21 (!) 140/64    Lab Results  Component Value Date   HGBA1C 6.6 (H) 02/19/2022     Patient obtains medications through Adherence Packaging  30 Days   Last adherence delivery included:  Hydrochlorothiazide 25 mg : take one tab at breakfast Losartan Potassium (Cozaar) 100 mg :  take one tab at evening meal Cyclobenzaprine (Flexiril)  5 mg : take one tab at evening meal Atorvastatin (Lipitor) 80 mg : take one tab at evening meal Linzess 145 mcg Take one capsule before breakfast Xarelto 20 mg : take one tab at evening meal Dronedarone (Multaq) 400 mg : take one tab at lunch and one at evening meal Ezetimbe (Zetia) 10 mg : take one tab at breakfast Alprazolam (Xanax) 0.25 mg : Take 1 tab 3 times daily as needed Docusate Breakfast and Dinner Metformin    Patient needs refills for Freestyle sensors: Patient reports she has 3 days left on her current and last sensor coordinated delivery for 12/22.   Patient is due  for next adherence delivery on: 04/15/22. Called patient and reviewed medications and coordinated delivery. Packs 30 DS  This delivery to include: Hydrochlorothiazide 25 mg : take one tab at breakfast Losartan Potassium (Cozaar) 100 mg :  take one tab at evening meal Cyclobenzaprine (Flexiril)  5 mg : take one tab at evening meal Atorvastatin (Lipitor) 80 mg : take one tab at evening meal Linzess 145 mcg Take one capsule before breakfast Xarelto 20 mg : take one tab at evening meal Dronedarone (Multaq) 400 mg : take one tab at lunch and one at evening meal Ezetimbe (Zetia) 10 mg : take one tab at  breakfast Alprazolam (Xanax) 0.25 mg : Take 1 tab 3 times daily as needed Docusate: Take one at Breakfast and [facility] to go to pt in Feb she is aware and in agreement *requested fills on Losartan & HCTZ from PCP and Linzess from Dr Mallie Mussel.   Patient declined the following medications  Metformin : Take one tab at Breakfast  Patient needs refills for : Enulose   Confirmed delivery date of 04/15/22, advised patient that pharmacy will contact them the morning of delivery.   Care Gaps: Eye Exam - Overdue Hepatitis C Screen - Overdue TDAP - Overdue Foot Exam - Overdue AWV- 11/29/19 office aware to sched COVID Booster - Overdue Zoster Vaccine - Postponed BP- 130/80 02/19/22 Lab Results  Component Value Date   HGBA1C 6.6 (H) 02/19/2022    Star Rating Drugs: Metformin (Glucophage XR) 500 mg - Last filled 03/12/22 30 DS at [name]  Losartan (Cozaar) 100 mg - Last filled 03/12/2022 30 DS at [name] Atorvastatin (Lipitor) 80 mg - Last filled 03/12/2022 30 DS at [facility] Pharmacist Assistant [phone]

## 2022-04-03 NOTE — Telephone Encounter (Signed)
Refill has been sent as requested.

## 2022-04-03 NOTE — Telephone Encounter (Signed)
[name] from patient's pharmacy is requesting Linzess refill.

## 2022-04-06 MED ORDER — LOSARTAN POTASSIUM 100 MG PO TABS: 100.0000 mg | ORAL_TABLET | Freq: Every evening | ORAL | 1 refills | Status: DC

## 2022-04-06 NOTE — Telephone Encounter (Signed)
-----  Message from Ned Clines sent at 04/03/2022  8:43 AM EST ----- Regarding: refills on Losartan & HCTZ Good Morning Ms Desrochers is due for a delivery from [name] on 04/15/22 and she is out of fills on her Losartan and her HCTZ, if you could send fills in to [name] that would be great. Thanks     Ned Clines [place] Clinical Pharmacist Assistant [phone]

## 2022-04-07 NOTE — Progress Notes (Signed)
This encounter was created in error - please disregard.

## 2022-04-08 DIAGNOSIS — D6869 Other thrombophilia: Secondary | ICD-10-CM | POA: Diagnosis not present

## 2022-04-08 DIAGNOSIS — E1142 Type 2 diabetes mellitus with diabetic polyneuropathy: Secondary | ICD-10-CM | POA: Diagnosis not present

## 2022-04-08 DIAGNOSIS — F3341 Major depressive disorder, recurrent, in partial remission: Secondary | ICD-10-CM | POA: Diagnosis not present

## 2022-04-08 DIAGNOSIS — E261 Secondary hyperaldosteronism: Secondary | ICD-10-CM | POA: Diagnosis not present

## 2022-04-08 DIAGNOSIS — I110 Hypertensive heart disease with heart failure: Secondary | ICD-10-CM | POA: Diagnosis not present

## 2022-04-08 DIAGNOSIS — M069 Rheumatoid arthritis, unspecified: Secondary | ICD-10-CM | POA: Diagnosis not present

## 2022-04-08 DIAGNOSIS — I509 Heart failure, unspecified: Secondary | ICD-10-CM | POA: Diagnosis not present

## 2022-04-08 DIAGNOSIS — I4891 Unspecified atrial fibrillation: Secondary | ICD-10-CM | POA: Diagnosis not present

## 2022-04-08 DIAGNOSIS — J8410 Pulmonary fibrosis, unspecified: Secondary | ICD-10-CM | POA: Diagnosis not present

## 2022-04-08 DIAGNOSIS — E1151 Type 2 diabetes mellitus with diabetic peripheral angiopathy without gangrene: Secondary | ICD-10-CM | POA: Diagnosis not present

## 2022-04-08 DIAGNOSIS — D8481 Immunodeficiency due to conditions classified elsewhere: Secondary | ICD-10-CM | POA: Diagnosis not present

## 2022-04-08 DIAGNOSIS — E1169 Type 2 diabetes mellitus with other specified complication: Secondary | ICD-10-CM | POA: Diagnosis not present

## 2022-04-09 ENCOUNTER — Other Ambulatory Visit: Payer: Self-pay | Admitting: Internal Medicine

## 2022-04-09 DIAGNOSIS — I10 Essential (primary) hypertension: Secondary | ICD-10-CM

## 2022-04-14 DIAGNOSIS — M25512 Pain in left shoulder: Secondary | ICD-10-CM | POA: Diagnosis not present

## 2022-04-14 DIAGNOSIS — G4733 Obstructive sleep apnea (adult) (pediatric): Secondary | ICD-10-CM | POA: Diagnosis not present

## 2022-04-16 ENCOUNTER — Encounter: Payer: Self-pay | Admitting: Obstetrics and Gynecology

## 2022-04-16 ENCOUNTER — Ambulatory Visit (INDEPENDENT_AMBULATORY_CARE_PROVIDER_SITE_OTHER): Payer: HMO | Admitting: Obstetrics and Gynecology

## 2022-04-16 VITALS — BP 175/80 | HR 56 | Ht 65.5 in | Wt 182.0 lb

## 2022-04-16 DIAGNOSIS — Z01419 Encounter for gynecological examination (general) (routine) without abnormal findings: Secondary | ICD-10-CM

## 2022-04-16 DIAGNOSIS — K5900 Constipation, unspecified: Secondary | ICD-10-CM

## 2022-04-16 DIAGNOSIS — N3944 Nocturnal enuresis: Secondary | ICD-10-CM

## 2022-04-16 DIAGNOSIS — R32 Unspecified urinary incontinence: Secondary | ICD-10-CM

## 2022-04-16 DIAGNOSIS — N8110 Cystocele, unspecified: Secondary | ICD-10-CM

## 2022-04-16 NOTE — Progress Notes (Signed)
Pt states she has bowel concerns with hemorrhoids. Pt also complain of urinary leakage.  Pt states she had mammo last year.

## 2022-04-16 NOTE — Progress Notes (Signed)
GYNECOLOGY ANNUAL PREVENTATIVE CARE ENCOUNTER NOTE  History:     Miranda [name] is a 77 y.o. G15P0 female here for a routine annual gynecologic exam.  Current complaints: urinary incontinence.  She also has some issues with constipation and diarrhea.  Pt has followed up for these symptoms with gastroenterologist previously.    Denies abnormal vaginal bleeding, discharge, pelvic pain, problems with intercourse or other gynecologic concerns.    Pt notes she leaks urine with coughing or laughing.  She notes nocturia x 2-3 .  She does note some urge symptoms as well.  Pt will occasionally wake up and she has wet the bed.  The patient has never used a pessary or any urologic medications   Gynecologic History No LMP recorded. Patient has had a hysterectomy. Contraception: status post hysterectomy Last Pap: pt no longer receives pap due to age/hx of hysterectomy. Last mammogram: 04/18/20. Results were: normal  Obstetric History OB History  Gravida Para Term Preterm AB Living  4         4  SAB IAB Ectopic Multiple Live Births               # Outcome Date GA Lbr Len/2nd Weight Sex Delivery Anes PTL Lv  4 Gravida      Vag-Spont     3 Gravida      Vag-Spont     2 Gravida      Vag-Spont     1 Gravida      Vag-Spont       Past Medical History:  Diagnosis Date   Anxiety    Arthritis    Asthma    Atrial fibrillation (HCC)    CHF (congestive heart failure) ([place])    pt. unsure- but thinks she was hosp. for CHF- 2002   Ch[facility] kidney disease    [facility]   Clotting disorder ([place])    blood clots in lungs/PE pulmonary embolism   Colon polyps    Complication of anesthesia    states requires a lot med. to put her to sleep    DDD (degenerative disc disease) 09/17/2011   Depression    "sometimes "   DM (diabetes mellitus) ([place])    Family history of anesthesia complication    Sister had difficulty waking up from anesthesia   GERD (gastroesophageal reflux disease)    Glaucoma     bilateral, pt. admits that she is noncompliant to eye gtts.    Hemorrhoids    Hyperlipidemia    Hypertension    had stress, echo- 2006 /w [place], Cardiac Cath, per pt. 2002, echo repeated 2012- wnl    IPF (idiopathic pulmonary fibrosis) (HCC)    Low back pain    Shortness of breath    Sleep apnea    uses c-pap- q night recently    Past Surgical History:  Procedure Laterality Date   ABDOMINAL HYSTERECTOMY     ectopic, fibroids   ANTERIOR CERVICAL DECOMP/DISCECTOMY FUSION N/A 09/11/2020   Procedure: Anterior Cervical Decompression Fusion Cervical four-five, Cervical five-six;  Surgeon: [name] Mare, MD;  Location: [place];  Service: Neurosurgery;  Laterality: N/A;   arm surgery Right    CARDIAC CATHETERIZATION     CATARACT EXTRACTION, BILATERAL     cataracts removed bilateral- ?IOL   COLONOSCOPY     remote   FISSURECTOMY  10/08/2011   Procedure: FISSURECTOMY;  Surgeon: Stark Klein, MD;  Location: [place];  Service: General;  Laterality: N/A;   [place]  SIGMOIDOSCOPY  02/25/2011   Procedure: FLEXIBLE SIGMOIDOSCOPY;  Surgeon: Inda Castle, MD;  Location: [facility];  Service: Endoscopy;  Laterality: N/A;   FOOT SURGERY     bilat, heel spurs- screw in R foot    HEMORRHOID SURGERY  10/08/2011   Procedure: HEMORRHOIDECTOMY;  Surgeon: Stark Klein, MD;  Location: [place];  Service: General;  Laterality: N/A;  External    ROTATOR CUFF REPAIR Right    SPHINCTEROTOMY  10/08/2011   Procedure: Joan Mayans;  Surgeon: Stark Klein, MD;  Location: [place];  Service: General;  Laterality: N/A;    Current Outpatient Medications on File Prior to Visit  Medication Sig Dispense Refill   acetaminophen (TYLENOL) 325 MG tablet Take 650 mg by mouth every 6 (six) hours as needed for moderate pain.     albuterol (VENTOLIN HFA) 108 (90 Base) MCG/ACT inhaler Inhale 1 puff into the lungs every 6 (six) hours as needed. 18 g 1   ALPRAZolam (XANAX) 0.25 MG tablet TAKE ONE TABLET BY MOUTH three  times daily AS NEEDED 90 tablet 2   atorvastatin (LIPITOR) 80 MG tablet TAKE ONE TABLET BY MOUTH EVERY EVENING 90 tablet 1   cyclobenzaprine (FLEXERIL) 5 MG tablet TAKE ONE TABLET BY MOUTH EVERY EVENING 90 tablet 4   dronedarone (MULTAQ) 400 MG tablet TAKE ONE TABLET BY MOUTH AT NOON and TAKE ONE TABLET BY MOUTH EVERY EVENING 180 tablet 2   ezetimibe (ZETIA) 10 MG tablet TAKE ONE TABLET BY MOUTH ONCE DAILY 90 tablet 1   hydrochlorothiazide (HYDRODIURIL) 25 MG tablet TAKE ONE TABLET BY MOUTH ONCE DAILY 90 tablet 1   HYDROcodone-acetaminophen (NORCO) 10-325 MG tablet Take 1 tablet by mouth every 6 (six) hours as needed for pain.     lactulose, encephalopathy, (ENULOSE) 10 GM/15ML SOLN take 84ms BY MOUTH three times daily AS NEEDED FOR mild constipation 473 mL 1   linaclotide (LINZESS) 145 MCG CAPS capsule TAKE ONE CAPSULE BY MOUTH BEFORE BREAKFAST 30 capsule 2   losartan (COZAAR) 100 MG tablet Take 1 tablet (100 mg total) by mouth every evening. 90 tablet 1   metFORMIN (GLUCOPHAGE-XR) 500 MG 24 hr tablet TAKE ONE TABLET BY MOUTH EVERY MORNING 90 tablet 1   Pirfenidone 801 MG TABS TAKE 1 TABLET BY MOUTH 3 TIMES A DAY 180 tablet 0   XARELTO 20 MG TABS tablet TAKE ONE TABLET BY MOUTH EVERY EVENING 90 tablet 1   Accu-Chek Softclix Lancets lancets Use as instructed 100 each 12   Continuous Blood Gluc Receiver (FREESTYLE LIBRE 2 READER) DEVI 1 each by Does not apply route daily. 2 each 6   Continuous Blood Gluc Sensor (FREESTYLE LIBRE 2 SENSOR) MISC 1 each by Does not apply route daily. 2 each 6   No current facility-administered medications on file prior to visit.    Allergies  Allergen Reactions   Fish Oil Anaphylaxis   Other Hives, Shortness Of Breath and Swelling    Allergic to cashew nuts and peanut oil.   Penicillins Anaphylaxis, Hives and Swelling    Has patient had a PCN reaction causing immediate rash, facial/tongue/throat swelling, SOB or lightheadedness with hypotension: Face swelling  and hives started first, then swelling of the throat  Has patient had a PCN reaction causing severe rash involving mucus membranes or skin necrosis: Yes  Has patient had a PCN reaction that required hospitalization: No  Has patient had a PCN reaction occurring within the last 10 years: Yes  If all of the above answers are "  NO", then may proceed with Cephalospor   Pneumococcal Vaccines Nausea And Vomiting   Cashew Nut Oil    Nitrofurantoin Macrocrystal Hives   Peanut Oil    Aspirin Itching and Rash   Bactrim [Sulfamethoxazole-Trimethoprim] Hives, Itching and Rash   Ciprofloxacin Hives, Itching and Rash   Ibuprofen Rash   Influenza Vaccines Hives   Ivp Dye [Iodinated Contrast Media] Hives, Itching and Rash    Gives benadryl to counteract symptoms   Latex Rash   Macrobid [Nitrofurantoin Monohydrate Macrocrystals] Hives   Shellfish Allergy Hives    Patient also allergic to seafood    Social History:  reports that she has never smoked. She has never been exposed to tobacco smoke. She has never used smokeless tobacco. She reports that she does not drink alcohol and does not use drugs.  Family History  Problem Relation Age of Onset   Heart attack Mother    Heart disease Mother    Breast cancer Mother    Emphysema Sister    Breast cancer Sister    Arthritis/Rheumatoid Sister    Asthma Sister    Lung cancer Sister    COPD Sister    Colon cancer Brother    Colon cancer Brother    Anesthesia problems Neg Hx    Esophageal cancer Neg Hx    Rectal cancer Neg Hx    Stomach cancer Neg Hx     The following portions of the patient's history were reviewed and updated as appropriate: allergies, current medications, past family history, past medical history, past social history, past surgical history and problem list.  Review of Systems Pertinent items noted in HPI and remainder of comprehensive ROS otherwise negative.  Physical Exam:  BP (!) 175/80   Pulse (!) 56   Ht 5' 5.5" (1.664 m)    Wt 182 lb (82.6 kg)   BMI 29.83 kg/m  CONSTITUTIONAL: Well-developed, well-nourished female in no acute distress.  HENT:  Normocephalic, atraumatic, External right and left ear normal. Oropharynx is clear and moist EYES: Conjunctivae and EOM are normal. NECK: Normal range of motion, supple, no masses.  Normal thyroid.  SKIN: Skin is warm and dry. No rash noted. Not diaphoretic. No erythema. No pallor. MUSCULOSKELETAL: Normal range of motion. No tenderness.  No cyanosis, clubbing, or edema.  2+ distal pulses. NEUROLOGIC: Alert and oriented to person, place, and time. Normal reflexes, muscle tone coordination.  PSYCHIATRIC: Normal mood and affect. Normal behavior. Normal judgment and thought content. CARDIOVASCULAR: Normal heart rate noted, regular rhythm, occasional premature beat noted RESPIRATORY: Clear to auscultation bilaterally. Effort and breath sounds normal, no problems with respiration noted. BREASTS: Symmetric in size. No masses, tenderness, skin changes, nipple drainage, or lymphadenopathy bilaterally. Performed in the presence of a chaperone. ABDOMEN: Soft, no distention noted.  No tenderness, rebound or guarding.  PELVIC: Normal appearing external genitalia and urethral meatus; atrophic appearing vaginal mucosa.Absent cervix noted. No abnormal discharge noted.  No other palpable masses, no uterine or adnexal tenderness.  Performed in the presence of a chaperone.  Small cystocele noted.  No loss of fluid with cough or valsalva   Assessment and Plan:    1. Women's annual routine gynecological examination Normal annual exam BP noted to be elevated, due to significant cardiovascular hx, pt advised to follow up with her cardiologist  - MM Digital Screening; Future  2. Urinary incontinence, unspecified type Will refer to urogynecology  for evaluation /possible treatment - Ambulatory referral to Urogynecology  3. Enuresis, nocturnal only  -  Ambulatory referral to  Urogynecology  4. Constipation, unspecified constipation type Pt advised to followed up with previous GI physician to discuss further treatment  5. Cystocele without uterine prolapse Evaluation with urogyn  Mammogram scheduled Routine preventative health maintenance measures emphasized. Please refer to After Visit Summary for other counseling recommendations.      Lynnda Shields, MD, [place] for [facility], [place]

## 2022-04-17 ENCOUNTER — Other Ambulatory Visit: Payer: Self-pay | Admitting: Internal Medicine

## 2022-04-17 DIAGNOSIS — J84112 Idiopathic pulmonary fibrosis: Secondary | ICD-10-CM

## 2022-04-17 NOTE — Telephone Encounter (Signed)
Refill sent for PIRFENIDONE to [facility] (pulmonary fibrosis team): [phone]  Dose: 801 mg three times daily  Last OV: 11/12/2021 as OV and 12/08/21 as Pulmonix visit Provider: Dr. Chase Caller Labs on 02/19/2022 - stable  Next OV: not scheduled  Knox Saliva, PharmD, MPH, BCPS Clinical Pharmacist (Rheumatology and Pulmonology)

## 2022-04-20 DIAGNOSIS — D6869 Other thrombophilia: Secondary | ICD-10-CM | POA: Insufficient documentation

## 2022-04-20 DIAGNOSIS — M5412 Radiculopathy, cervical region: Secondary | ICD-10-CM | POA: Diagnosis not present

## 2022-04-20 DIAGNOSIS — M5136 Other intervertebral disc degeneration, lumbar region: Secondary | ICD-10-CM | POA: Diagnosis not present

## 2022-04-20 DIAGNOSIS — M5030 Other cervical disc degeneration, unspecified cervical region: Secondary | ICD-10-CM | POA: Diagnosis not present

## 2022-04-20 DIAGNOSIS — M5416 Radiculopathy, lumbar region: Secondary | ICD-10-CM | POA: Diagnosis not present

## 2022-04-23 ENCOUNTER — Telehealth: Payer: Self-pay | Admitting: Internal Medicine

## 2022-04-24 ENCOUNTER — Other Ambulatory Visit (HOSPITAL_COMMUNITY): Payer: Self-pay

## 2022-04-24 NOTE — Telephone Encounter (Signed)
Submitted a Prior Authorization request to  [employer] for HTA  for PIRFENIDONE via CoverMyMeds. Will update once we receive a response.  Key: [id number]  Per automated response: Prior Authorization duplicate/approved  Knox Saliva, PharmD, MPH, BCPS, CPP Clinical Pharmacist (Rheumatology and Pulmonology)

## 2022-04-29 NOTE — Telephone Encounter (Signed)
Returned call and spoke with pt, however she was expressing some difficulty understanding the whole process and asked if she could have Miranda [name] call me back. Provided my direct office number, will await return call.

## 2022-04-29 NOTE — Telephone Encounter (Signed)
Patient's companion, Letta Median, called to ask about getting assistance for the patient to get her Pirfenidone.  She stated that an order needs to be sent in order for patient to receive the medication.  She would like a call back to explain the process.  Please advise and call at [phone] or [phone]

## 2022-04-30 ENCOUNTER — Telehealth: Payer: Self-pay | Admitting: Internal Medicine

## 2022-04-30 DIAGNOSIS — J84112 Idiopathic pulmonary fibrosis: Secondary | ICD-10-CM

## 2022-04-30 NOTE — Telephone Encounter (Signed)
Routing to Dr. Vaughan Browner as a FYI.

## 2022-05-04 ENCOUNTER — Other Ambulatory Visit (HOSPITAL_COMMUNITY): Payer: Self-pay

## 2022-05-04 ENCOUNTER — Other Ambulatory Visit: Payer: Self-pay | Admitting: Internal Medicine

## 2022-05-04 ENCOUNTER — Other Ambulatory Visit: Payer: Self-pay | Admitting: Cardiology

## 2022-05-04 DIAGNOSIS — I480 Paroxysmal atrial fibrillation: Secondary | ICD-10-CM

## 2022-05-04 NOTE — Telephone Encounter (Signed)
ATC patient. This program that patient is using is charging her monthly fee to apply for [facility] which is what we do in the clinic for free. Unfortunately, patient's copay for generic is >$3000. [employer] will not enroll patient if insurance is requiring her to try/fail generic as this is considered step therapy. This external company uses same process and will be unlikely to get medication for her for free.  Ultimately, it is up to patient if she wants to continue to pursue asistance through this org. No PF grants are currently open  [email or web address]  Left VM requesting return call.  Knox Saliva, PharmD, MPH, BCPS, CPP Clinical Pharmacist (Rheumatology and Pulmonology)

## 2022-05-04 NOTE — Telephone Encounter (Signed)
ATC patient. Pharmacy team received a note (no financial assistance paperwork) from patient that says she is receiving assistance from some prescription assistance program. This program that patient is using is charging her monthly fee to apply for [facility] which is what we do in the clinic for free. Unfortunately, patient's copay for generic is >$3000. [employer] will not enroll patient if insurance is requiring her to try/fail generic as this is considered step therapy. This external company uses same process and will be unlikely to get medication for her for free.  Ultimately, it is up to patient if she wants to continue to pursue asistance through this org. No PF grants are currently open  [email or web address]  Left VM requesting return call.  Knox Saliva, PharmD, MPH, BCPS, CPP Clinical Pharmacist (Rheumatology and Pulmonology)

## 2022-05-04 NOTE — Telephone Encounter (Signed)
Attempted to run PA for brand-name Esbriet in anticipation of potential PAP submission, however it appears her previous authorization for generic pirfenidone also covers name-brand. Test claim reveals that name-brand is around $2,000 cheaper than generic for some reason, however copay is still largely unaffordable.  Due to lack of PA approval letter (since HTA automatically extends maintenance medication approvals and does not generate approval letters after having done so) I have grabbed a screenshot of both test claims as well as the cancellation notice on CMM. Will await delivery of PAP paperwork to pharmacy team's mailbox as it is most likely been put into Dr. Donald Prose box.

## 2022-05-04 NOTE — Telephone Encounter (Signed)
Spoke with patient regarding her enrollment into Prescription Assistance 123 program. She states that she found via searching online. She tried to apply for grants ([employer], [employer]), but they're all closed. She states that Letta Median helped her sign up for this program and does not remember what papers she filled out.  There is 0000000 application fee and monthly "copay."  I advised that this is not a copay as if she was approved for PAP, there would be no copay. Miranda [name] will work to [employer] - reviewed written notice requirement  Rose Fillers (patient's friend and advocate): [phone]  Celso Amy patient portion of application placed up front for patient in file cabinet. Patient will stop by tomorrow to sign form. Prescriber form placed in Dr. Golden Pop mailbox for signature  Knox Saliva, PharmD, MPH, BCPS, CPP Clinical Pharmacist (Rheumatology and Pulmonology)

## 2022-05-05 ENCOUNTER — Telehealth: Payer: Self-pay | Admitting: Internal Medicine

## 2022-05-05 NOTE — Telephone Encounter (Signed)
PT came in to PU form for Medication Fin assistance. Signed and returned to me. Per post it I put it in the RX box. Nothing else needed.

## 2022-05-05 NOTE — Telephone Encounter (Signed)
Received signed patient portion of [employer] PAP application. Pending provider portoin to be returned from Dr. Chase Caller. I have placed pt portion in PAP pending info folder in pharmacy office.  Knox Saliva, PharmD, MPH, BCPS, CPP Clinical Pharmacist (Rheumatology and Pulmonology)

## 2022-05-06 ENCOUNTER — Other Ambulatory Visit: Payer: Self-pay | Admitting: Internal Medicine

## 2022-05-06 DIAGNOSIS — F419 Anxiety disorder, unspecified: Secondary | ICD-10-CM

## 2022-05-08 ENCOUNTER — Ambulatory Visit: Payer: HMO | Admitting: Obstetrics and Gynecology

## 2022-05-08 DIAGNOSIS — J84112 Idiopathic pulmonary fibrosis: Secondary | ICD-10-CM | POA: Diagnosis not present

## 2022-05-08 DIAGNOSIS — I700 Atherosclerosis of aorta: Secondary | ICD-10-CM | POA: Diagnosis not present

## 2022-05-08 DIAGNOSIS — Z7901 Long term (current) use of anticoagulants: Secondary | ICD-10-CM | POA: Diagnosis not present

## 2022-05-08 DIAGNOSIS — N183 Chronic kidney disease, stage 3 unspecified: Secondary | ICD-10-CM | POA: Diagnosis not present

## 2022-05-08 DIAGNOSIS — I509 Heart failure, unspecified: Secondary | ICD-10-CM | POA: Diagnosis not present

## 2022-05-08 DIAGNOSIS — D6869 Other thrombophilia: Secondary | ICD-10-CM | POA: Diagnosis not present

## 2022-05-08 DIAGNOSIS — Z7984 Long term (current) use of oral hypoglycemic drugs: Secondary | ICD-10-CM | POA: Diagnosis not present

## 2022-05-08 DIAGNOSIS — I480 Paroxysmal atrial fibrillation: Secondary | ICD-10-CM | POA: Diagnosis not present

## 2022-05-08 DIAGNOSIS — E1122 Type 2 diabetes mellitus with diabetic chronic kidney disease: Secondary | ICD-10-CM | POA: Diagnosis not present

## 2022-05-12 ENCOUNTER — Telehealth: Payer: Self-pay | Admitting: Internal Medicine

## 2022-05-12 ENCOUNTER — Other Ambulatory Visit (HOSPITAL_COMMUNITY): Payer: Self-pay

## 2022-05-12 NOTE — Telephone Encounter (Signed)
See last signed encounter.   Please advise PT's neighbor, Massie Maroon, about status of financial aide Rx request that was brought in to [place] a few days ago. TY.

## 2022-05-14 ENCOUNTER — Other Ambulatory Visit (HOSPITAL_COMMUNITY): Payer: Self-pay

## 2022-05-14 DIAGNOSIS — G4733 Obstructive sleep apnea (adult) (pediatric): Secondary | ICD-10-CM | POA: Diagnosis not present

## 2022-05-14 NOTE — Telephone Encounter (Signed)
Received signed provider form from Dr. Chase Caller. Received multiple calls from pt/friend [place]. Returned clal to them today advising that we were waiting on provider portion and will reach out with determination - had to leave VM regardinf this  Knox Saliva, PharmD, MPH, BCPS, CPP Clinical Pharmacist (Rheumatology and Pulmonology)

## 2022-05-14 NOTE — Telephone Encounter (Signed)
Received signed provider form from Dr. Chase Caller. Received multiple calls from pt/friend [place]. Returned clal to them today advising that we were waiting on provider portion and will reach out with determination - had to leave VM  Submitted Patient Assistance Application to [facility] for [place] along with provider portion, patient portion, insurance card copy, and med list. Will update patient when we receive a response.  Fax# [phone] Phone# [phone]  Knox Saliva, PharmD, MPH, BCPS, CPP Clinical Pharmacist (Rheumatology and Pulmonology)

## 2022-05-18 ENCOUNTER — Telehealth: Payer: Self-pay

## 2022-05-18 NOTE — Progress Notes (Signed)
   05/18/2022  Patient ID: Miranda [name], female   DOB: [birth year], 77 y.o.   MRN: [id number]  Contact made to schedule telephone visit 05/19/22 with patient identified in quality report for True [place] Metric:  Hypertension in [place] or African American population.  Will discuss medication management specific to HTN prior to upcoming PCP visit 05/21/22.    Darlina Guys, PharmD, DPLA

## 2022-05-19 ENCOUNTER — Other Ambulatory Visit: Payer: HMO

## 2022-05-19 NOTE — Progress Notes (Signed)
05/19/2022 Name: Miranda [name] MRN: [id number] DOB: October 19, 1945  Chief Complaint  Patient presents with   Medication Management   Miranda [name] is a 77 y.o. year old female who presented for a telephone visit.   They were referred to the pharmacist by a quality report for assistance in managing hypertension.   Patient is participating in a Managed Medicaid Plan:  No  Subjective: Patient appearing on quality report for True [place] Metric Hypertension in [place] or African American population.  Blood Pressure 175/80 at recent OBGYN visit 04/16/22. Care Team: Primary Care Provider: [name], [name], MD ; Next Scheduled Visit: 05/21/22  Medication Access/Adherence Current Pharmacy:  [name] - [place], [place] - [address] [place] [address] Phone: [phone] Fax: [phone]  [facility], [place] - [address] [place] [address] Phone: [phone] Fax: [phone]  Patient reports affordability concerns with their medications: Yes  Patient reports access/transportation concerns to their pharmacy: No  Patient reports adherence concerns with their medications:  Yes  Running low on certain medications due to cost  Diabetes: Current medications: metformin XR '500mg'$  daily  -Patient states she takes metformin "when she needs it for high blood sugar" -Current glucose readings: not able to provide, but patient does state rarely if ever >180, but does have symptomatic hypoglycemia (BG=60's) 2-3x/week -Using Freestyle Libre CGM to monitor continuously  Hypertension: Current medications: losartan '100mg'$  daily, hctz '25mg'$  daily  -Patient has a validated, automated, upper arm home BP cuff -Current blood pressure readings readings: range 124/64-170/80  Hyperlipidemia/ASCVD Risk Reduction Current lipid lowering medications: atorvastatin '80mg'$  daily, ezetimibe '10mg'$   daily  -02/20/23 Lipids WNL  Medication Management: Current adherence strategy: uses weekly pill organizer  -Patient reports Good adherence to medications -Patient reports the following barriers to adherence: affordability of medications, specifically Multaq, Xarelto, Pifenidone, and Linzess -States [name] has not sent because she was unable to pay the copay, but she has supply on hand for now  Objective: Lab Results  Component Value Date   HGBA1C 6.6 (H) 02/19/2022   Lab Results  Component Value Date   CREATININE 1.01 02/19/2022   BUN 19 02/19/2022   NA 139 02/19/2022   K 3.9 02/19/2022   CL 101 02/19/2022   CO2 33 (H) 02/19/2022   Lab Results  Component Value Date   CHOL 145 02/19/2022   HDL 72.60 02/19/2022   LDLCALC 60 02/19/2022   LDLDIRECT 152.8 03/30/2011   TRIG 60.0 02/19/2022   CHOLHDL 2 02/19/2022   Medications Reviewed Today     Reviewed by Griffin Basil, MD (Physician) on 04/16/22 at [phone]  Med List Status: <None>   Medication Order Taking? Sig Documenting Provider Last Dose Status Informant  Accu-Chek Softclix Lancets lancets [id number]  Use as instructed [name], [name], MD  Active Self  acetaminophen (TYLENOL) 325 MG tablet [id number] Yes Take 650 mg by mouth every 6 (six) hours as needed for moderate pain. [provider] Taking Active Self           Med Note (RATLIFF, THURSHELL   Wed Nov 26, 2021  2:14 PM) As needed  albuterol (VENTOLIN HFA) 108 (90 Base) MCG/ACT inhaler [id number] Yes Inhale 1 puff into the lungs every 6 (six) hours as needed. [name], [name], MD Taking Active Self           Med Note ([id number], THURSHELL   Wed Nov 26, 2021  2:15 PM) As needes  ALPRAZolam (XANAX) 0.25 MG tablet [id number] Yes TAKE ONE TABLET BY MOUTH three times daily AS NEEDED [name], [name], MD Taking Active   atorvastatin (LIPITOR) 80 MG tablet [id number] Yes TAKE ONE TABLET BY MOUTH EVERY EVENING [name], [name], MD  Taking Active   Continuous Blood Gluc Receiver (FREESTYLE LIBRE 2 READER) DEVI [id number]  1 each by Does not apply route daily. [name], [name], MD  Active   Continuous Blood Gluc Sensor (FREESTYLE LIBRE 2 SENSOR) [place] [id number]  1 each by Does not apply route daily. [name], [name], MD  Active   cyclobenzaprine (FLEXERIL) 5 MG tablet [id number] Yes TAKE ONE TABLET BY MOUTH EVERY EVENING [name], [name], MD Taking Active   dronedarone (MULTAQ) 400 MG tablet [id number] Yes TAKE ONE TABLET BY MOUTH AT NOON and TAKE ONE TABLET BY MOUTH EVERY EVENING [name], Will Hassell Done, MD Taking Active   ezetimibe (ZETIA) 10 MG tablet [id number] Yes TAKE ONE TABLET BY MOUTH ONCE DAILY [name], [name], MD Taking Active   hydrochlorothiazide (HYDRODIURIL) 25 MG tablet [id number] Yes TAKE ONE TABLET BY MOUTH ONCE DAILY [name], [name], MD Taking Active   HYDROcodone-acetaminophen [facility]) 10-325 MG tablet [id number] Yes Take 1 tablet by mouth every 6 (six) hours as needed for pain. [provider] Taking Active Self  lactulose, encephalopathy, (ENULOSE) 10 GM/15ML SOLN [id number] Yes take 105ms BY MOUTH three times daily AS NEEDED FOR mild constipation HIsaac Bliss ERayford Halsted MD Taking Active            Med Note (RATLIFF, THURSHELL   Wed Nov 26, 2021  2:18 PM) As needed  linaclotide (Rolan Lipa 145 MCG CAPS capsule [id number] TAKE ONE CAPSULE BY MOUTH BEFORE BREAKFAST DDoran Stabler MD Taking Active   losartan (COZAAR) 100 MG tablet [id number] Take 1 tablet (100 mg total) by mouth every evening. HIsaac Bliss ERayford Halsted MD Taking Active   metFORMIN (GLUCOPHAGE-XR) 500 MG 24 hr tablet [id number] TAKE ONE TABLET BY MOUTH EVERY MORNING HIsaac Bliss ERayford Halsted MD Taking Active   Pirfenidone 801 MG TABS [id number] TAKE 1 TABLET BY MOUTH 3 TIMES A DRoney Marion MD Taking Active   XARELTO 20 MG TABS tablet [id number] TAKE ONE TABLET BY  MOUTH EVERY EVENING HIsaac Bliss ERayford Halsted MD Taking Active            Assessment/Plan:   Diabetes: - Currently controlled - Reviewed goal A1c, goal fasting, and goal 2 hour post prandial glucose - Recommend to get A1c at upcoming visit.  If remaining <7, consider discontinuing metformin; it is likely not having much effect and could eliminate episodes of hypoglycemia, especially in older adult.  - Recommend to check glucose continuously with Libre CGM  Hypertension: - Currently uncontrolled - Recommended to check home blood pressure and heart rate at home at least 2-3x/week and record value - Patient endorsed anxiety and worry prior to OBGYN visit where 175/80 reading was measured - Can add additional agent if consistently elevated- amlodipine 2.'5mg'$  daily would be appropriate  Hyperlipidemia/ASCVD Risk Reduction: - Currently controlled.  - Recommend to continue current regimen  Medication Management: - Currently strategy insufficient to maintain appropriate adherence to prescribed medication regimen - Contacted insurance to get options of preferred pharmacies in the area.  [facility]  Will provide information to patient and  orchestrate transferring medications if desired. - Recommend that patient apply for Extra Help based on income and need for assistance with the affordability of multiple medications.  Emailing information to friend/neighbor, Massie Maroon, per patient request (she assists Ms. Kinyon with various tasks).  If she does not qualify, we can look into PAP for Xarelto and Linzess. -Already receiving assistance in applying for funds to help with Pifenidone.  Follow Up Plan: Will contact patient in 2 weeks to check on home BP, BG, and status of Medicare Extra Help application  Darlina Guys, PharmD, DPLA

## 2022-05-21 ENCOUNTER — Ambulatory Visit: Payer: HMO | Admitting: Pulmonary Disease

## 2022-05-21 ENCOUNTER — Encounter: Payer: Self-pay | Admitting: Internal Medicine

## 2022-05-21 ENCOUNTER — Telehealth: Payer: Self-pay | Admitting: Pulmonary Disease

## 2022-05-21 ENCOUNTER — Encounter: Payer: Self-pay | Admitting: Pulmonary Disease

## 2022-05-21 ENCOUNTER — Ambulatory Visit (INDEPENDENT_AMBULATORY_CARE_PROVIDER_SITE_OTHER): Payer: HMO | Admitting: Internal Medicine

## 2022-05-21 VITALS — BP 140/80 | HR 64 | Temp 97.9°F | Ht 65.0 in | Wt 183.4 lb

## 2022-05-21 VITALS — BP 160/80 | HR 55 | Temp 98.1°F | Wt 182.3 lb

## 2022-05-21 DIAGNOSIS — M545 Low back pain, unspecified: Secondary | ICD-10-CM

## 2022-05-21 DIAGNOSIS — J84112 Idiopathic pulmonary fibrosis: Secondary | ICD-10-CM

## 2022-05-21 DIAGNOSIS — E785 Hyperlipidemia, unspecified: Secondary | ICD-10-CM

## 2022-05-21 DIAGNOSIS — I10 Essential (primary) hypertension: Secondary | ICD-10-CM | POA: Diagnosis not present

## 2022-05-21 DIAGNOSIS — I4891 Unspecified atrial fibrillation: Secondary | ICD-10-CM

## 2022-05-21 DIAGNOSIS — G4733 Obstructive sleep apnea (adult) (pediatric): Secondary | ICD-10-CM | POA: Diagnosis not present

## 2022-05-21 DIAGNOSIS — Z5181 Encounter for therapeutic drug level monitoring: Secondary | ICD-10-CM

## 2022-05-21 DIAGNOSIS — G8929 Other chronic pain: Secondary | ICD-10-CM | POA: Diagnosis not present

## 2022-05-21 DIAGNOSIS — E119 Type 2 diabetes mellitus without complications: Secondary | ICD-10-CM

## 2022-05-21 DIAGNOSIS — J4520 Mild intermittent asthma, uncomplicated: Secondary | ICD-10-CM | POA: Diagnosis not present

## 2022-05-21 LAB — POCT GLYCOSYLATED HEMOGLOBIN (HGB A1C): Hemoglobin A1C: 6 % — AB (ref 4.0–5.6)

## 2022-05-21 MED ORDER — FREESTYLE LIBRE 2 SENSOR MISC: 1.0000 | Freq: Every day | 6 refills | Status: DC

## 2022-05-21 MED ORDER — AMLODIPINE BESYLATE 5 MG PO TABS: 5.0000 mg | ORAL_TABLET | Freq: Every day | ORAL | 1 refills | Status: DC

## 2022-05-21 NOTE — Progress Notes (Signed)
CAEDANCE ZUPAN    [id number]    February 23, 1946  Primary Care Physician:Hernandez Everardo Beals, MD  Referring Physician: [name], [name], MD [address] [place],  [place] [address]  Chief complaint: Follow-up for interstitial lung disease, OSA  HPI: Mrs. Miranda [name] is here for follow-up of obstructive sleep apnea, pulmonary fibrosis. PE in 2018 opn anticoagulation, proxysmal atrial fib She has history of UIP fibrosis.  There is history of rheumatoid arthritis in chart but connective tissue serologies are negative with no arthritis, joint pain symptoms.  Pulmonary fibrosis is felt to be more consistent with IPF and she has been on Esbriet since 2019  She was hospitalized in early February 2023 for dehydration, AKI and rapid atrial fibrillation.  Overall improved since discharge  Pets: Has a dog.  No cats, birds, farm animals Occupation: Retired Quarry manager Exposures: Has feather pillows at home but not in her bedroom.  No mold, hot tub, Jacuzzi Smoking history: Never smoker Travel history: No significant travel history Relevant family history: No significant family history of lung disease  Interim history: She had a break in her [place] therapy for the past few months due to insurance issues and is very anxious about it States that breathing is stable but gets worse during her anxiety attacks  Outpatient Encounter Medications as of 05/21/2022  Medication Sig   acetaminophen (TYLENOL) 325 MG tablet Take 650 mg by mouth every 6 (six) hours as needed for moderate pain.   albuterol (VENTOLIN HFA) 108 (90 Base) MCG/ACT inhaler Inhale 1 puff into the lungs every 6 (six) hours as needed.   ALPRAZolam (XANAX) 0.25 MG tablet TAKE ONE TABLET BY MOUTH three times daily AS NEEDED   amLODipine (NORVASC) 5 MG tablet Take 1 tablet (5 mg total) by mouth daily.   atorvastatin (LIPITOR) 80 MG tablet TAKE ONE TABLET BY MOUTH EVERY EVENING   Continuous Blood Gluc Receiver (FREESTYLE  LIBRE 2 READER) DEVI 1 each by Does not apply route daily.   Continuous Blood Gluc Sensor (FREESTYLE LIBRE 2 SENSOR) MISC 1 each by Does not apply route daily.   cyclobenzaprine (FLEXERIL) 5 MG tablet TAKE ONE TABLET BY MOUTH EVERY EVENING   ezetimibe (ZETIA) 10 MG tablet TAKE ONE TABLET BY MOUTH ONCE DAILY   hydrochlorothiazide (HYDRODIURIL) 25 MG tablet TAKE ONE TABLET BY MOUTH ONCE DAILY   HYDROcodone-acetaminophen (NORCO) 10-325 MG tablet Take 1 tablet by mouth every 6 (six) hours as needed for pain.   lactulose, encephalopathy, (ENULOSE) 10 GM/15ML SOLN take 2ms BY MOUTH three times daily AS NEEDED FOR mild constipation   linaclotide (LINZESS) 145 MCG CAPS capsule TAKE ONE CAPSULE BY MOUTH BEFORE BREAKFAST   losartan (COZAAR) 100 MG tablet Take 1 tablet (100 mg total) by mouth every evening.   metFORMIN (GLUCOPHAGE-XR) 500 MG 24 hr tablet TAKE ONE TABLET BY MOUTH EVERY MORNING   MULTAQ 400 MG tablet TAKE ONE TABLET BY MOUTH AT NOON and TAKE ONE TABLET BY MOUTH EVERY EVENING   XARELTO 20 MG TABS tablet TAKE ONE TABLET BY MOUTH EVERY EVENING   Pirfenidone 801 MG TABS TAKE 1 TABLET BY MOUTH 3 TIMES A DAY (Patient not taking: Reported on 05/21/2022)   No facility-administered encounter medications on file as of 05/21/2022.   Physical Exam: Blood pressure (!) 140/80, pulse 64, temperature 97.9 F (36.6 C), temperature source Oral, height '5\' 5"'$  (1.651 m), weight 183 lb 6.4 oz (83.2 kg), SpO2 95 %. Gen:  No acute distress HEENT:  EOMI, sclera anicteric Neck:     No masses; no thyromegaly Lungs:    Bibasal crackles CV:         Regular rate and rhythm; no murmurs Abd:      + bowel sounds; soft, non-tender; no palpable masses, no distension Ext:    No edema; adequate peripheral perfusion Skin:      Warm and dry; no rash Neuro: alert and oriented x 3 Psych: normal mood and affect   Data Reviewed: Imaging: High-res CT 12/09/2018- mild pulmonary fibrosis and basilar predominance.  Traction  bronchiectasis, honeycombing.  Unchanged compared to 2017  High-res CT 12/15/2019-unchanged UIP pulmonary fibrosis  High-res CT 10/24/2021-stable pattern of UIP pulmonary fibrosis I have reviewed all images personally  PFTs: 06/14/2017 FVC 2.21 [89%], FEV1 1.88 [98%],/55, TLC 3.68 [70%], DLCO 14.69 [57%]  10/15/2019 FVC 2.10 [83%), FEV1 1.42 [73%], F/F 68, TLC 3.77 70%], DLCO 16.51 [80%]  11/12/2021 FVC 2.25 [73%], FEV1 1.91 [82%], F/F 85, TLC 3.79 [70%], DLCO 16.71 [81%] Mild restriction  Labs: CTD serologies including CCP, rheumatoid factor 2017, 2018, 2019-negative Comprehensive metabolic panel [id number]- normal.  Sleep study: 10/2016 PSG> AHI 10.5   CPAP download 12/19/2018-77% compliance.  Residual AHI 2  Cardiac: Echocardiogram 10/19/2017-LVEF 60 to [id number], grade 1 diastolic dysfunction, mild RV dilatation.  Assessment:  Pulmonary fibrosis Although she has history of rheumatoid arthritis in the chart she does not have any symptoms.  And has negative serologies.  Presentation is consistent with IPF which has been stable on CT and PFTs She is very anxious as there is a break in the therapy due to insurance issues.  I will follow-up with pharmacy to see what her reapplication is for patient assistance Will give samples of pirfenidone until we can resume the prescription.  Hepatic panel in December 2023 reviewed and are normal  Evaluation for pulmonary hypertension Does not have lower extremity edema.  Last echocardiogram notes mild RV dilatation though no mention of increased pressures. proBNP is normal  Obstructive sleep apnea Having issues with CPAP and she will contact DME company to get a replacement She has been using her CPAP regularly and was benefiting from treatment before the malfunction of equipment.  Remote pulmonary embolism Continue anticoagulation  Health maintenance Has been vaccinated against Covid  Plan/Recommendations: - Continue Esbriet with samples -  Check with pharmacy about patient assistance - Continue CPAP  [name] MD [facility] 05/21/2022, 2:37 PM  CC: [name], [name]

## 2022-05-21 NOTE — Patient Instructions (Signed)
-  Nice seeing you today!!  -Start amlodipine 5 mg daily for BP.  -Schedule follow up in 3 months.

## 2022-05-21 NOTE — Progress Notes (Signed)
Established Patient Office Visit     CC/Reason for Visit: Follow-up chronic conditions  HPI: Miranda [name] is a 77 y.o. female who is coming in today for the above mentioned reasons. Past Medical History is significant for: Hypertension, hyperlipidemia, type 2 diabetes, paroxysmal A-fib and pulmonary idiopathic fibrosis.  She is feeling well.  She is concerned about the cost of her medication.  She is being followed by pharmacy and is applying for patient assistance programs.  She is very tearful about this.   Past Medical/Surgical History: Past Medical History:  Diagnosis Date   Anxiety    Arthritis    Asthma    Atrial fibrillation (HCC)    CHF (congestive heart failure) ([place])    pt. unsure- but thinks she was hosp. for CHF- 2002   Chronic kidney disease    [facility]   Clotting disorder ([place])    blood clots in lungs/PE pulmonary embolism   Colon polyps    Complication of anesthesia    states requires a lot med. to put her to sleep    DDD (degenerative disc disease) 09/17/2011   Depression    "sometimes "   DM (diabetes mellitus) ([place])    Family history of anesthesia complication    Sister had difficulty waking up from anesthesia   GERD (gastroesophageal reflux disease)    Glaucoma    bilateral, pt. admits that she is noncompliant to eye gtts.    Hemorrhoids    Hyperlipidemia    Hypertension    had stress, echo- 2006 /w , Cardiac Cath, per pt. 2002, echo repeated 2012- wnl    IPF (idiopathic pulmonary fibrosis) (HCC)    Low back pain    Shortness of breath    Sleep apnea    uses c-pap- q night recently    Past Surgical History:  Procedure Laterality Date   ABDOMINAL HYSTERECTOMY     ectopic, fibroids   ANTERIOR CERVICAL DECOMP/DISCECTOMY FUSION N/A 09/11/2020   Procedure: Anterior Cervical Decompression Fusion Cervical four-five, Cervical five-six;  Surgeon: [name] Mare, MD;  Location: [facility];  Service: Neurosurgery;  Laterality:  N/A;   arm surgery Right    CARDIAC CATHETERIZATION     CATARACT EXTRACTION, BILATERAL     cataracts removed bilateral- ?IOL   COLONOSCOPY     remote   FISSURECTOMY  10/08/2011   Procedure: FISSURECTOMY;  Surgeon: Stark Klein, MD;  Location: [facility];  Service: General;  Laterality: N/A;   FLEXIBLE SIGMOIDOSCOPY  02/25/2011   Procedure: FLEXIBLE SIGMOIDOSCOPY;  Surgeon: Inda Castle, MD;  [facility]: [facility];  Service: Endoscopy;  Laterality: N/A;   FOOT SURGERY     bilat, heel spurs- screw in R foot    HEMORRHOID SURGERY  10/08/2011   Procedure: HEMORRHOIDECTOMY;  Surgeon: Stark Klein, MD;  Location: [facility];  Service: General;  Laterality: N/A;  External    ROTATOR CUFF REPAIR Right    SPHINCTEROTOMY  10/08/2011   Procedure: Joan Mayans;  Surgeon: Stark Klein, MD;  Location: [facility];  Service: General;  Laterality: N/A;    Social History:  reports that she has never smoked. She has never been exposed to tobacco smoke. She has never used smokeless tobacco. She reports that she does not drink alcohol and does not use drugs.  Allergies: Allergies  Allergen Reactions   Fish Oil Anaphylaxis   Other Hives, Shortness Of Breath and Swelling    Allergic to cashew nuts and peanut oil.   Penicillins  Anaphylaxis, Hives and Swelling    Has patient had a PCN reaction causing immediate rash, facial/tongue/throat swelling, SOB or lightheadedness with hypotension: Face swelling and hives started first, then swelling of the throat  Has patient had a PCN reaction causing severe rash involving mucus membranes or skin necrosis: Yes  Has patient had a PCN reaction that required hospitalization: No  Has patient had a PCN reaction occurring within the last 10 years: Yes  If all of the above answers are "NO", then may proceed with Cephalospor   Pneumococcal Vaccines Nausea And Vomiting   Cashew Nut Oil    Nitrofurantoin Macrocrystal Hives   Peanut Oil    Aspirin Itching and Rash   Bactrim  [Sulfamethoxazole-Trimethoprim] Hives, Itching and Rash   Ciprofloxacin Hives, Itching and Rash   Ibuprofen Rash   Influenza Vaccines Hives   Ivp Dye [Iodinated Contrast Media] Hives, Itching and Rash    Gives benadryl to counteract symptoms   Latex Rash   Macrobid [Nitrofurantoin Monohydrate Macrocrystals] Hives   Shellfish Allergy Hives    Patient also allergic to seafood    Family History:  Family History  Problem Relation Age of Onset   Heart attack Mother    Heart disease Mother    Breast cancer Mother    Emphysema Sister    Breast cancer Sister    Arthritis/Rheumatoid Sister    Asthma Sister    Lung cancer Sister    COPD Sister    Colon cancer Brother    Colon cancer Brother    Anesthesia problems Neg Hx    Esophageal cancer Neg Hx    Rectal cancer Neg Hx    Stomach cancer Neg Hx      Current Outpatient Medications:    acetaminophen (TYLENOL) 325 MG tablet, Take 650 mg by mouth every 6 (six) hours as needed for moderate pain., Disp: , Rfl:    albuterol (VENTOLIN HFA) 108 (90 Base) MCG/ACT inhaler, Inhale 1 puff into the lungs every 6 (six) hours as needed., Disp: 18 g, Rfl: 1   ALPRAZolam (XANAX) 0.25 MG tablet, TAKE ONE TABLET BY MOUTH three times daily AS NEEDED, Disp: 90 tablet, Rfl: 3   amLODipine (NORVASC) 5 MG tablet, Take 1 tablet (5 mg total) by mouth daily., Disp: 90 tablet, Rfl: 1   atorvastatin (LIPITOR) 80 MG tablet, TAKE ONE TABLET BY MOUTH EVERY EVENING, Disp: 90 tablet, Rfl: 1   Continuous Blood Gluc Receiver (FREESTYLE LIBRE 2 READER) DEVI, 1 each by Does not apply route daily., Disp: 2 each, Rfl: 6   cyclobenzaprine (FLEXERIL) 5 MG tablet, TAKE ONE TABLET BY MOUTH EVERY EVENING, Disp: 90 tablet, Rfl: 4   ezetimibe (ZETIA) 10 MG tablet, TAKE ONE TABLET BY MOUTH ONCE DAILY, Disp: 90 tablet, Rfl: 1   hydrochlorothiazide (HYDRODIURIL) 25 MG tablet, TAKE ONE TABLET BY MOUTH ONCE DAILY, Disp: 90 tablet, Rfl: 1   HYDROcodone-acetaminophen (NORCO) 10-325 MG  tablet, Take 1 tablet by mouth every 6 (six) hours as needed for pain., Disp: , Rfl:    lactulose, encephalopathy, (ENULOSE) 10 GM/15ML SOLN, take 49ms BY MOUTH three times daily AS NEEDED FOR mild constipation, Disp: 473 mL, Rfl: 1   linaclotide (LINZESS) 145 MCG CAPS capsule, TAKE ONE CAPSULE BY MOUTH BEFORE BREAKFAST, Disp: 30 capsule, Rfl: 2   losartan (COZAAR) 100 MG tablet, Take 1 tablet (100 mg total) by mouth every evening., Disp: 90 tablet, Rfl: 1   metFORMIN (GLUCOPHAGE-XR) 500 MG 24 hr tablet, TAKE ONE TABLET BY MOUTH  EVERY MORNING, Disp: 90 tablet, Rfl: 1   MULTAQ 400 MG tablet, TAKE ONE TABLET BY MOUTH AT NOON and TAKE ONE TABLET BY MOUTH EVERY EVENING, Disp: 180 tablet, Rfl: 2   Pirfenidone 801 MG TABS, TAKE 1 TABLET BY MOUTH 3 TIMES A DAY, Disp: 270 tablet, Rfl: 1   XARELTO 20 MG TABS tablet, TAKE ONE TABLET BY MOUTH EVERY EVENING, Disp: 90 tablet, Rfl: 1   Continuous Blood Gluc Sensor (FREESTYLE LIBRE 2 SENSOR) MISC, 1 each by Does not apply route daily., Disp: 2 each, Rfl: 6  Review of Systems:  Negative unless indicated in HPI.   Physical Exam: Vitals:   05/21/22 1110 05/21/22 1111  BP: (!) 178/75 (!) 160/80  Pulse: (!) 55   Temp: 98.1 F (36.7 C)   TempSrc: Oral   SpO2: 99%   Weight: 182 lb 4.8 oz (82.7 kg)     Body mass index is 29.87 kg/m.   Physical Exam Vitals reviewed.  Constitutional:      Appearance: Normal appearance.  HENT:     Head: Normocephalic and atraumatic.  Eyes:     Conjunctiva/sclera: Conjunctivae normal.     Pupils: Pupils are equal, round, and reactive to light.  Cardiovascular:     Rate and Rhythm: Normal rate and regular rhythm.  Pulmonary:     Effort: Pulmonary effort is normal.     Breath sounds: Normal breath sounds.  Skin:    General: Skin is warm and dry.  Neurological:     General: No focal deficit present.     Mental Status: She is alert and oriented to person, place, and time.  Psychiatric:        Mood and Affect:  Mood normal.        Behavior: Behavior normal.        Thought Content: Thought content normal.        Judgment: Judgment normal.      Impression and Plan:  Diabetes mellitus without complication ([place]) - Plan: POC HgB A1c, AMB Referral to [facility] (ACO Patients)  Primary hypertension - Plan: AMB Referral to [facility] (ACO Patients), amLODipine (NORVASC) 5 MG tablet  Dyslipidemia - Plan: AMB Referral to [facility] (ACO Patients)  Mild intermittent asthma without complication - Plan: AMB Referral to [facility] (ACO Patients)  Chronic low back pain without sciatica, unspecified back pain laterality - Plan: AMB Referral to [facility] (ACO Patients)  Atrial fibrillation with RVR ([place]) - Plan: AMB Referral to [facility] (ACO Patients)  -A1c of 6.0 demonstrates excellent diabetic control. -Blood pressure is not well-controlled today.  She is on hydrochlorothiazide 25 mg and losartan 100 mg.  I will add amlodipine 5 mg and have her return in 3 months for follow-up.  She will also continue to do ambulatory blood pressure monitoring. -She is having a lot of issues affording costly medications, mainly Xarelto and pirfenidone.  I will ask pharmacy to assist.  Time spent:32 minutes reviewing chart, interviewing and examining patient and formulating plan of care.     [name], MD [facility] at [facility]

## 2022-05-21 NOTE — Telephone Encounter (Signed)
Pirfenidone sample received and placed in bag at front desk for patient pick up.  I called patient and left detailed message that pirfenidone was placed at front for patient pick up. Call back number left for patient to call with any questions.

## 2022-05-21 NOTE — Patient Instructions (Signed)
I will check with pharmacist to see where we are with your application Will also see if he can give you samples of pirfenidone to use until we can figure out your coverage and resume therapy Return to clinic in 3 months.

## 2022-05-21 NOTE — Addendum Note (Signed)
Addended by: [name] Y on: 05/21/2022 03:05 PM   Modules accepted: Level of Service

## 2022-05-26 ENCOUNTER — Telehealth: Payer: Self-pay | Admitting: Pulmonary Disease

## 2022-05-26 DIAGNOSIS — J84112 Idiopathic pulmonary fibrosis: Secondary | ICD-10-CM

## 2022-05-26 NOTE — Telephone Encounter (Signed)
Grayland Ormond states needs copy of patient's benefits. For Esbriet medication. Grayland Ormond phone number is [phone] option 5.

## 2022-05-26 NOTE — Telephone Encounter (Signed)
Pirfenidone is still at front desk for patient to pick up. Called patient again today and left message on VM to let her know she had a bottle of Pirfenidone at front desk for her to pick up.

## 2022-05-27 NOTE — Telephone Encounter (Signed)
Returned call to [place]. Rep states patient looks like good candidate for program but would have to speak to pt directly to complete welcome call. Attempted to conference call but phone went straight to VM. I called patient's friend Letta Median who states that she cannot complete welcome call right now because she is at lunch. I advise dher to notify Erielle of this and that she will need to speak to company to ever receive shipment. I called pt back and left VM with [employer]'s phone number   [place] phone: [phone] [facility] phone: [phone]   Once approval letter is received, will need to send in rx for maintenance supply since patient received 30 day sample supply at last [place], PharmD, MPH, BCPS, CPP Clinical Pharmacist (Rheumatology and Pulmonology)

## 2022-05-27 NOTE — Telephone Encounter (Signed)
Received a fax from  [place] regarding an approval for [place] patient assistance from 05/27/2022 until therapy is discontinued, patient health insurance or financial status changes, or pt no longer meets program eligibility requirements. Approval letter sent to scan center.  [name] phone: [phone] [facility] phone: [phone]  [place] Saliva, PharmD, MPH, BCPS, CPP Clinical Pharmacist (Rheumatology and Pulmonology)

## 2022-05-27 NOTE — Telephone Encounter (Signed)
Returned call to [place]. Rep states patient looks like good candidate for program but would have to speak to pt directly to complete welcome call. Attempted to conference call but phone went straight to VM. I called patient's friend Letta Median who states that she cannot complete welcome call right now because she is at lunch. I advise dher to notify Gayla of this and that she will need to speak to company to ever receive shipment. I called pt back and left VM with [employer]'s phone number  [place] phone: [phone] [facility] phone: [phone]  Once approval letter is received, will need to send in rx for maintenance supply since patient received 30 day sample supply at last [place], PharmD, MPH, BCPS, CPP Clinical Pharmacist (Rheumatology and Pulmonology)

## 2022-05-29 MED ORDER — PIRFENIDONE 801 MG PO TABS: 1.0000 | ORAL_TABLET | Freq: Three times a day (TID) | ORAL | 1 refills | Status: DC

## 2022-05-29 NOTE — Telephone Encounter (Signed)
Rx for Esbriet 801mg  tab three times daily sent to [facility] today. ATC patient to confirm she has completed onboarding. Phone again went straight to VM.  Left detailed VM with [employer] and [facility] phone number. Advised she call me back with any questions or concerns about this. I did receive a fax from [facility] yest asking for updating Esbriet rx so presumably, patient has completed onboarding.  Knox Saliva, PharmD, MPH, BCPS, CPP Clinical Pharmacist (Rheumatology and Pulmonology)

## 2022-06-02 ENCOUNTER — Other Ambulatory Visit: Payer: HMO

## 2022-06-02 NOTE — Progress Notes (Signed)
   06/02/2022  Patient ID: Miranda [name], female   DOB: 19-Feb-1946, 77 y.o.   MRN: [id number]  Telephone follow-up visit to check on the status of apply for Medicare Extra Help.  I was able to see PharmD with pulmonology is assisting with affordability of Esbriet for patient; and patient states she has received this medication at no cost to her.  She was not able to verify if Extra Help has been applied for or not.  She states she contacted social security and needed to show proof of $8,000 of expenses to qualify for one program; but she was unsure of what program and did not have expense information easily available to provide them.  She did state she is in the process of transferring medications to [facility], because insurance is contracted there and pricing should be cheaper than with [name].  I have also sent a message to follow-up with her neighbor Letta Median that was assisting in applying for Extra Help in hopes of getting clarification whether she applied or not.  I made sure that patient and Letta Median have my direct number and know to reach out if copays are not affordable, or if she hits her donut hole.  Encouraged Extra Help to prevent this; but if it does not work out, we can at least apply for PAP for Xarelto.Darlina Guys, PharmD, DPLA

## 2022-06-04 ENCOUNTER — Telehealth: Payer: Self-pay

## 2022-06-04 NOTE — Progress Notes (Addendum)
Care Management & Coordination Services Pharmacy Note  06/05/2022 Name:  Miranda [name] MRN:  [id number] DOB:  1945/04/06  Summary: BP not at goal <130/80, reports has been elevated above 160 recently due to stress Was unable to check her BP virtually today, cuff was not working Has not started amlodipine yet - needs to pick up A1C at goal <7  Recommendations/Changes made from today's visit: -Check BP daily and keep a log. START amlodipine as prescribed. -Continue to be mindful of salt and carbohydrate intake.  Follow up plan: BP call in 1 month to assess effectiveness of amlodipine 5mg  Pharmacist visit in 3 months   Subjective: Miranda [name] is an 77 y.o. year old female who is a primary patient of [name], [name], MD.  The care coordination team was consulted for assistance with disease management and care coordination needs.    Engaged with patient by telephone for follow up visit.  Recent office visits: 05/21/22 [name], MD - For T2DM and other concerns, START Amlodipine 5mg  1 qd  Recent consult visits: 05/21/22 [name], MD (Pulm) - No med changes, currently without Esbriet due to issues obtaining  04/20/22 Burt Ek, MD [facility]) - For DDD - no other details avaialble  04/16/22 Wolfgang Phoenix, MD (ObGyn) - For annual women's exam  04/14/22 Vickki Hearing, MD ([facility]) - For shoulder pain, no other detals available  Hospital visits: None in previous 6 months   Objective:  Lab Results  Component Value Date   CREATININE 1.01 02/19/2022   BUN 19 02/19/2022   GFR 54.50 (L) 02/19/2022   GFRNONAA 58 (L) 04/17/2021   GFRAA >60 11/04/2017   NA 139 02/19/2022   K 3.9 02/19/2022   CALCIUM 9.8 02/19/2022   CO2 33 (H) 02/19/2022   GLUCOSE 94 02/19/2022    Lab Results  Component Value Date/Time   HGBA1C 6.0 (A) 05/21/2022 11:23 AM   HGBA1C 6.6 (H) 02/19/2022 02:04 PM   HGBA1C 5.8 (A) 01/20/2022 02:25 PM   HGBA1C 6.4 09/18/2021  10:29 AM   HGBA1C 6.0 03/14/2021 10:19 AM   GFR 54.50 (L) 02/19/2022 02:04 PM   GFR 59.58 (L) 09/18/2021 10:29 AM   MICROALBUR <0.7 09/18/2021 10:29 AM    Last diabetic Eye exam: No results found for: "HMDIABEYEEXA"  Last diabetic Foot exam: No results found for: "HMDIABFOOTEX"   Lab Results  Component Value Date   CHOL 145 02/19/2022   HDL 72.60 02/19/2022   LDLCALC 60 02/19/2022   LDLDIRECT 152.8 03/30/2011   TRIG 60.0 02/19/2022   CHOLHDL 2 02/19/2022       Latest Ref Rng & Units 02/19/2022    2:04 PM 09/18/2021   10:29 AM 04/24/2021    3:01 PM  Hepatic Function  Total Protein 6.0 - 8.3 g/dL 7.6  8.0  7.3   Albumin 3.5 - 5.2 g/dL 4.3  4.5  3.9   AST 0 - 37 U/L 25  27  26    ALT 0 - 35 U/L 30  31  24    Alk Phosphatase 39 - 117 U/L 35  39  36   Total Bilirubin 0.2 - 1.2 mg/dL 0.5  0.4  0.5     Lab Results  Component Value Date/Time   TSH 1.274 04/14/2021 11:26 AM   TSH 1.45 12/01/2019 10:25 AM   TSH 1.499 09/14/2017 04:58 PM   TSH 0.86 06/01/2014 11:45 AM       Latest Ref Rng & Units 02/19/2022  2:04 PM 09/18/2021   10:29 AM 04/15/2021    2:07 AM  CBC  WBC 4.0 - 10.5 K/uL 5.7  5.6  4.9   Hemoglobin 12.0 - 15.0 g/dL 12.8  12.7  10.6   Hematocrit 36.0 - 46.0 % 38.1  38.3  31.3   Platelets 150.0 - 400.0 K/uL 175.0  188.0  151     Lab Results  Component Value Date/Time   VD25OH 32.63 02/19/2022 02:04 PM   VD25OH 36.31 08/20/2021 09:00 AM   VITAMINB12 310 12/01/2019 10:25 AM    Clinical ASCVD: No  The 10-year ASCVD risk score (Arnett DK, et al., 2019) is: 31.4%   Values used to calculate the score:     Age: 77 years     Sex: Female     Is Non-Hispanic African American: Yes     Diabetic: Yes     Tobacco smoker: No     Systolic Blood Pressure: [id number] mmHg     Is BP treated: Yes     HDL Cholesterol: 72.6 mg/dL     Total Cholesterol: 145 mg/dL    DEXA completed 02/25/22, showed normal BMD     02/19/2022    1:26 PM 01/20/2022    2:32 PM 04/24/2021    2:42 PM   Depression screen PHQ 2/9  Decreased Interest 0 0 1  Down, Depressed, Hopeless 0 0 0  PHQ - 2 Score 0 0 1  Altered sleeping 0 0 2  Tired, decreased energy 0 0 2  Change in appetite 0 0 3  Feeling bad or failure about yourself  0 0 0  Trouble concentrating 0 0 1  Moving slowly or fidgety/restless 0 0 1  Suicidal thoughts 0 0 0  PHQ-9 Score 0 0 10  Difficult doing work/chores Not difficult at all Not difficult at all      Social History   Tobacco Use  Smoking Status Never   Passive exposure: Never  Smokeless Tobacco Never   BP Readings from Last 3 Encounters:  05/21/22 (!) 140/80  05/21/22 (!) 160/80  04/16/22 (!) 175/80   Pulse Readings from Last 3 Encounters:  05/21/22 64  05/21/22 (!) 55  04/16/22 (!) 56   Wt Readings from Last 3 Encounters:  05/21/22 183 lb 6.4 oz (83.2 kg)  05/21/22 182 lb 4.8 oz (82.7 kg)  04/16/22 182 lb (82.6 kg)   BMI Readings from Last 3 Encounters:  05/21/22 30.52 kg/m  05/21/22 29.87 kg/m  04/16/22 29.83 kg/m    Allergies  Allergen Reactions   Fish Oil Anaphylaxis   Other Hives, Shortness Of Breath and Swelling    Allergic to cashew nuts and peanut oil.   Penicillins Anaphylaxis, Hives and Swelling    Has patient had a PCN reaction causing immediate rash, facial/tongue/throat swelling, SOB or lightheadedness with hypotension: Face swelling and hives started first, then swelling of the throat  Has patient had a PCN reaction causing severe rash involving mucus membranes or skin necrosis: Yes  Has patient had a PCN reaction that required hospitalization: No  Has patient had a PCN reaction occurring within the last 10 years: Yes  If all of the above answers are "NO", then may proceed with Cephalospor   Pneumococcal Vaccines Nausea And Vomiting   Cashew Nut Oil    Nitrofurantoin Macrocrystal Hives   Peanut Oil    Aspirin Itching and Rash   Bactrim [Sulfamethoxazole-Trimethoprim] Hives, Itching and Rash   Ciprofloxacin Hives,  Itching and Rash   Ibuprofen  Rash   Influenza Vaccines Hives   Ivp Dye [Iodinated Contrast Media] Hives, Itching and Rash    Gives benadryl to counteract symptoms   Latex Rash   Macrobid [Nitrofurantoin Monohydrate Macrocrystals] Hives   Shellfish Allergy Hives    Patient also allergic to seafood    Medications Reviewed Today     Reviewed by Maren Reamer, [place] (Pharmacist) on 06/05/22 at [facility] List Status: <None>   Medication Order Taking? Sig Documenting Provider Last Dose Status Informant  acetaminophen (TYLENOL) 325 MG tablet [id number] No Take 650 mg by mouth every 6 (six) hours as needed for moderate pain. [provider] Taking Active Self           Med Note (RATLIFF, THURSHELL   Wed Nov 26, 2021  2:14 PM) As needed  albuterol (VENTOLIN HFA) 108 (90 Base) MCG/ACT inhaler [id number] No Inhale 1 puff into the lungs every 6 (six) hours as needed. [name], [name], MD Taking Active Self           Med Note ([id number], THURSHELL   Wed Nov 26, 2021  2:15 PM) As needes  ALPRAZolam Duanne Moron) 0.25 MG tablet [id number] No TAKE ONE TABLET BY MOUTH three times daily AS NEEDED [name], [name], MD Taking Active   amLODipine (NORVASC) 5 MG tablet [id number] No Take 1 tablet (5 mg total) by mouth daily. [name], [name], MD Taking Active   atorvastatin (LIPITOR) 80 MG tablet [id number] No TAKE ONE TABLET BY MOUTH EVERY EVENING [name], [name], MD Taking Active   Continuous Blood Gluc Receiver (FREESTYLE LIBRE 2 READER) [place] [id number] No 1 each by Does not apply route daily. [name], [name], MD Taking Active   Continuous Blood Gluc Sensor (FREESTYLE LIBRE 2 SENSOR) [place] [id number] No 1 each by Does not apply route daily. [name], [name], MD Taking Active   cyclobenzaprine [facility]) 5 MG tablet [id number] No TAKE ONE TABLET BY MOUTH EVERY EVENING [name], [name], MD Taking Active            Med Note Colin Rhein, CHERYL A    Tue May 19, 2022  9:10 AM) As needed  ezetimibe (ZETIA) 10 MG tablet [id number] No TAKE ONE TABLET BY MOUTH ONCE DAILY [name], [name], MD Taking Active   hydrochlorothiazide (HYDRODIURIL) 25 MG tablet [id number] No TAKE ONE TABLET BY MOUTH ONCE DAILY [name], [name], MD Taking Active   HYDROcodone-acetaminophen [facility]) 10-325 MG tablet [id number] No Take 1 tablet by mouth every 6 (six) hours as needed for pain. [provider] Taking Active Self  lactulose, encephalopathy, (ENULOSE) 10 GM/15ML SOLN [id number] No take 81mLs BY MOUTH three times daily AS NEEDED FOR mild constipation [name], [name], MD Taking Active            Med Note (RATLIFF, THURSHELL   Wed Nov 26, 2021  2:18 PM) As needed  linaclotide Rolan Lipa) 145 MCG CAPS capsule [id number] No TAKE ONE CAPSULE BY MOUTH BEFORE BREAKFAST [name], MD Taking Active            Med Note Colin Rhein, CHERYL A   Tue May 19, 2022  9:18 AM) Cannot afford  losartan (COZAAR) 100 MG tablet [id number] No Take 1 tablet (100 mg total) by mouth every evening. [name], [name], MD Taking Active   metFORMIN (GLUCOPHAGE-XR) 500 MG 24 hr tablet [id number] No TAKE ONE TABLET BY MOUTH EVERY MORNING [name], [name],  MD Taking Active            Med Note Colin Rhein, CHERYL A   Tue May 19, 2022  9:35 AM) States only taking as needed if "sugar is too high"  MULTAQ 400 MG tablet [id number] No TAKE ONE TABLET BY MOUTH AT NOON and TAKE ONE TABLET BY MOUTH EVERY EVENING [name], Will Hassell Done, MD Taking Active   Pirfenidone 801 MG TABS [id number]  Take 1 tablet (801 mg total) by mouth 3 (three) times daily. [name], MD  Active   XARELTO 20 MG TABS tablet [id number] No TAKE ONE TABLET BY MOUTH EVERY EVENING [name], [name], MD Taking Active             SDOH:  (Social Determinants of Health) assessments and interventions performed: Yes SDOH Interventions    [place] [place] Coordination from  06/05/2022 in [facility] from 01/09/2022 in [facility] [place] from 09/18/2021 in [facility] from 08/07/2019 in [place] [facility] at [place] Interventions Intervention Not Indicated -- -- --  Housing Interventions Intervention Not Indicated -- -- --  Transportation Interventions -- -- -- Intervention Not Indicated  Depression Interventions/Treatment  -- -- Currently on Treatment --  Financial Strain Interventions -- Intervention Not Indicated -- Intervention Not Indicated       Medication Assistance: None required.  Patient affirms current coverage meets needs.  Medication Access: Within the past 30 days, how often has patient missed a dose of medication? None Is a pillbox or other method used to improve adherence? Yes  Factors that may affect medication adherence? financial need Are meds synced by current pharmacy? No  Are meds delivered by current pharmacy? No  Does patient experience delays in picking up medications due to transportation concerns? No   [name] Services Reviewed: Is patient disadvantaged to use [name]?: Yes  Current Rx insurance plan: HTA Name and location of Current pharmacy:  [facility], [place] [address] [place] [address] Phone: [phone] Fax: [place], [place]. [place] [address] Phone: [phone] Fax: [phone]  [name] - [place], [place] - [address] [place] [address] Phone: [phone] Fax: [phone]  [name] services reviewed with patient today?: No  Patient requests to transfer care to [name]?: No  Reason patient declined to change  pharmacies: Disadvantaged due to insurance/mail order  Compliance/Adherence/Medication fill history: Care Gaps: Eye Exam - Overdue Hepatitis C Screen - Overdue TDAP - Overdue Foot Exam - Overdue COVID Booster - Overdue Zoster Vaccine - Postponed   Star-Rating Drugs: Metformin XR 500mg  PDC 33% Losartan 100mg  PDC 56% Atorvastatin 80mg  PDC 56%   Assessment/Plan   Hypertension (BP goal <130/80) -Uncontrolled -Current treatment: Amlodipine 5mg  1 qd Appropriate, Query Effective Hydrochlorothiazide 25mg  1 qd Appropriate, Effective, Safe, Accessible Losartan 100mg  1 qd Appropriate, Effective, Safe, Accessible -Medications previously tried: Benazepril , Lasix, Metoprolol -Current home readings: checking daily, says it has still been high but needs to pick up the amlodipine.  -Current dietary habits: mindful of salt intake -Current exercise habits: Not discussed -Denies hypotensive/hypertensive symptoms -Educated on BP goals and benefits of medications for prevention of heart attack, stroke and kidney damage; Daily salt  intake goal < 2300 mg; Exercise goal of 150 minutes per week; Importance of home blood pressure monitoring; Proper BP monitoring technique; -Counseled to monitor BP at home daily, document, and provide log at future appointments -Counseled on diet and exercise extensively Recommended to continue current medication Future consideration: Increase amlodipine to 10mg  daily if still un-controlled at next BP follow up  Diabetes (A1c goal <7%) -Controlled -Current medications: Metformin XR 500mg  1 qam Appropriate, Effective, Safe, Accessible -Medications previously tried: None  -Current home glucose readings WNL -Denies hypoglycemic/hyperglycemic symptoms -Current meal patterns:  Not discussed -Current exercise: Not discussed -Educated on A1c and blood sugar goals; -Counseled to check feet daily and get yearly eye exams -Recommended to continue current  medication  [place] Pharmacist [phone]

## 2022-06-04 NOTE — Progress Notes (Signed)
Patient ID: Miranda [name], female   DOB: December 13, 1945, 77 y.o.   MRN: [id number]  Care Management & Coordination Services Pharmacy Team  Reason for Encounter: Appointment Reminder  Contacted patient to confirm telephone appointment with Theo Dills, PharmD on 06/05/22 at 2. Spoke with patient on 06/04/2022     What is your top health concern you would like to discuss at your upcoming visit?  Patient reports shed like her meds switched back to [facility] as it will be cheaper with her insurance than [facility].      Star Rating Drugs:  Metformin (Glucophage XR) 500 mg - Last filled 03/12/22 30 DS at [name]  Losartan (Cozaar) 100 mg - Last filled 03/12/2022 30 DS at [name] Atorvastatin (Lipitor) 80 mg - Last filled 03/12/2022 30 DS at [place] Pharmacist Assistant [phone]

## 2022-06-05 ENCOUNTER — Ambulatory Visit: Payer: HMO

## 2022-06-11 ENCOUNTER — Other Ambulatory Visit: Payer: Self-pay | Admitting: *Deleted

## 2022-06-11 DIAGNOSIS — I10 Essential (primary) hypertension: Secondary | ICD-10-CM

## 2022-06-11 DIAGNOSIS — E785 Hyperlipidemia, unspecified: Secondary | ICD-10-CM

## 2022-06-11 MED ORDER — HYDROCHLOROTHIAZIDE 25 MG PO TABS: 25.0000 mg | ORAL_TABLET | Freq: Every day | ORAL | 1 refills | Status: DC

## 2022-06-11 MED ORDER — ATORVASTATIN CALCIUM 80 MG PO TABS: 80.0000 mg | ORAL_TABLET | Freq: Every evening | ORAL | 1 refills | Status: DC

## 2022-06-11 MED ORDER — EZETIMIBE 10 MG PO TABS: 10.0000 mg | ORAL_TABLET | Freq: Every day | ORAL | 1 refills | Status: DC

## 2022-06-13 DIAGNOSIS — G4733 Obstructive sleep apnea (adult) (pediatric): Secondary | ICD-10-CM | POA: Diagnosis not present

## 2022-06-15 ENCOUNTER — Other Ambulatory Visit: Payer: Self-pay | Admitting: *Deleted

## 2022-06-15 MED ORDER — FREESTYLE LIBRE 2 SENSOR MISC: 1.0000 | Freq: Every day | 6 refills | Status: DC

## 2022-06-17 DIAGNOSIS — M25512 Pain in left shoulder: Secondary | ICD-10-CM | POA: Diagnosis not present

## 2022-06-25 ENCOUNTER — Other Ambulatory Visit: Payer: Self-pay | Admitting: Internal Medicine

## 2022-06-25 DIAGNOSIS — F419 Anxiety disorder, unspecified: Secondary | ICD-10-CM

## 2022-06-25 DIAGNOSIS — I10 Essential (primary) hypertension: Secondary | ICD-10-CM

## 2022-06-25 MED ORDER — LOSARTAN POTASSIUM 100 MG PO TABS: 100.0000 mg | ORAL_TABLET | Freq: Every evening | ORAL | 1 refills | Status: DC

## 2022-06-25 MED ORDER — ALPRAZOLAM 0.25 MG PO TABS: 0.2500 mg | ORAL_TABLET | Freq: Three times a day (TID) | ORAL | 3 refills | Status: DC | PRN

## 2022-06-25 NOTE — Telephone Encounter (Signed)
Prescription Request  06/25/2022  LOV: 05/21/2022  What is the name of the medication or equipment?   ALPRAZolam (XANAX) 0.25 MG tablet   losartan (COZAAR) 100 MG tablet   Have you contacted your pharmacy to request a refill? Yes   Which pharmacy would you like this sent to?   [facility] [address] [place]), [place] - [id number] Phone: [phone]  Fax: [phone]       Patient notified that their request is being sent to the clinical staff for review and that they should receive a response within 2 business days.   Please advise at Mobile [phone] (mobile)

## 2022-06-28 DIAGNOSIS — M25512 Pain in left shoulder: Secondary | ICD-10-CM | POA: Diagnosis not present

## 2022-07-01 ENCOUNTER — Inpatient Hospital Stay: Admission: RE | Admit: 2022-07-01 | Payer: HMO | Source: Ambulatory Visit

## 2022-07-03 DIAGNOSIS — M75122 Complete rotator cuff tear or rupture of left shoulder, not specified as traumatic: Secondary | ICD-10-CM | POA: Diagnosis not present

## 2022-07-03 DIAGNOSIS — M75120 Complete rotator cuff tear or rupture of unspecified shoulder, not specified as traumatic: Secondary | ICD-10-CM | POA: Insufficient documentation

## 2022-07-07 ENCOUNTER — Ambulatory Visit: Payer: HMO

## 2022-07-08 ENCOUNTER — Ambulatory Visit: Payer: HMO

## 2022-07-08 ENCOUNTER — Ambulatory Visit: Payer: HMO | Admitting: Obstetrics and Gynecology

## 2022-07-08 ENCOUNTER — Ambulatory Visit
Admission: RE | Admit: 2022-07-08 | Discharge: 2022-07-08 | Disposition: A | Discharge status: Home / Self Care | Payer: HMO | Source: Ambulatory Visit | Attending: Obstetrics and Gynecology | Admitting: Obstetrics and Gynecology

## 2022-07-08 DIAGNOSIS — Z1231 Encounter for screening mammogram for malignant neoplasm of breast: Secondary | ICD-10-CM | POA: Diagnosis not present

## 2022-07-08 DIAGNOSIS — Z01419 Encounter for gynecological examination (general) (routine) without abnormal findings: Secondary | ICD-10-CM

## 2022-07-14 DIAGNOSIS — G4733 Obstructive sleep apnea (adult) (pediatric): Secondary | ICD-10-CM | POA: Diagnosis not present

## 2022-07-16 ENCOUNTER — Telehealth: Payer: Self-pay

## 2022-07-16 ENCOUNTER — Other Ambulatory Visit: Payer: Self-pay | Admitting: *Deleted

## 2022-07-16 DIAGNOSIS — I10 Essential (primary) hypertension: Secondary | ICD-10-CM

## 2022-07-16 DIAGNOSIS — I480 Paroxysmal atrial fibrillation: Secondary | ICD-10-CM

## 2022-07-16 MED ORDER — MULTAQ 400 MG PO TABS: 400.0000 mg | ORAL_TABLET | Freq: Two times a day (BID) | ORAL | 1 refills | Status: DC

## 2022-07-16 MED ORDER — AMLODIPINE BESYLATE 5 MG PO TABS: 5.0000 mg | ORAL_TABLET | Freq: Every day | ORAL | 1 refills | Status: DC

## 2022-07-16 MED ORDER — RIVAROXABAN 20 MG PO TABS: 20.0000 mg | ORAL_TABLET | Freq: Every evening | ORAL | 1 refills | Status: DC

## 2022-07-16 NOTE — Progress Notes (Signed)
Patient ID: Miranda [name], female   DOB: 01/16/1946, 77 y.o.   MRN: [id number]  Care Management & Coordination Services Pharmacy Team  Reason for Encounter: Hypertension  Contacted patient to discuss hypertension disease state. Spoke with patient on 07/16/2022     Current antihypertensive regimen:  Amlodipine 5mg  1 qd  Hydrochlorothiazide 25mg  1 qd Losartan 100mg  1 qd  Patient verbally confirms she is taking the above medications as directed. Yes  How often are you checking your Blood Pressure? 1-2x per week  she checks her blood pressure in the afternoon after taking her medication.  Current home BP readings:  135/60 , 138/66, on the phone today 150/66 p 57 after walking and finding her blood pressure cuff, she states it has been on in the 130's more often than not she denies any hyper or hypotension symptoms. Patient further reports she has been trying to be mindful of her salt intake. She requested refills on her Xarelto, Multaq and Amlodipine to be sent to [facility] on [place], forwarded to pharmacist for assistance.    Any readings above 180/100? No If yes any symptoms of hypertensive emergency? patient denies any symptoms of high blood pressure  What recent interventions/DTPs have been made by any provider to improve Blood Pressure control since last CPP Visit: Patient reports she restarted the amlodipine a few months back. Patient expresses she will be having surgery scheduled soon for her rotator cuff and is a little anxious about that.  Adherence Review: Is the patient currently on ACE/ARB medication? Yes Does the patient have >5 day gap between last estimated fill dates? Yes  Star Rating Drugs:  Metformin (Glucophage XR) 500 mg - Last filled 03/12/22 30 DS at [name]  Losartan (Cozaar) 100 mg - Last filled 03/12/2022 30 DS at [name] Atorvastatin (Lipitor) 80 mg - Last filled 03/12/2022 30 DS at [name]   Chart Updates: Recent office visits:  None  Recent consult  visits:  07/03/22  Rodolph Bong MD - Patient presented to [facility] for Pain of left shoulder. No medication changes.   06/17/22 Rodolph Bong MD - Patient presented to [facility] for Pain of left shoulder. No medication changes.   Hospital visits:  None in previous 6 months  Medications: Outpatient Encounter Medications as of 07/16/2022  Medication Sig Note   acetaminophen (TYLENOL) 325 MG tablet Take 650 mg by mouth every 6 (six) hours as needed for moderate pain. 11/26/2021: As needed   albuterol (VENTOLIN HFA) 108 (90 Base) MCG/ACT inhaler Inhale 1 puff into the lungs every 6 (six) hours as needed. 11/26/2021: As needes   ALPRAZolam (XANAX) 0.25 MG tablet Take 1 tablet (0.25 mg total) by mouth 3 (three) times daily as needed.    amLODipine (NORVASC) 5 MG tablet Take 1 tablet (5 mg total) by mouth daily.    atorvastatin (LIPITOR) 80 MG tablet Take 1 tablet (80 mg total) by mouth every evening.    Continuous Blood Gluc Receiver (FREESTYLE LIBRE 2 READER) DEVI 1 each by Does not apply route daily.    Continuous Blood Gluc Sensor (FREESTYLE LIBRE 2 SENSOR) MISC 1 each by Does not apply route daily.    cyclobenzaprine (FLEXERIL) 5 MG tablet TAKE ONE TABLET BY MOUTH EVERY EVENING 05/19/2022: As needed   ezetimibe (ZETIA) 10 MG tablet Take 1 tablet (10 mg total) by mouth daily.    hydrochlorothiazide (HYDRODIURIL) 25 MG tablet Take 1 tablet (25 mg total) by mouth daily.    HYDROcodone-acetaminophen (NORCO) 10-325 MG tablet Take  1 tablet by mouth every 6 (six) hours as needed for pain.    lactulose, encephalopathy, (ENULOSE) 10 GM/15ML SOLN take BY MOUTH three times daily AS NEEDED FOR mild constipation 11/26/2021: As needed   linaclotide (LINZESS) 145 MCG CAPS capsule TAKE ONE CAPSULE BY MOUTH BEFORE BREAKFAST 05/19/2022: Cannot afford   losartan (COZAAR) 100 MG tablet Take 1 tablet (100 mg total) by mouth every evening.    metFORMIN (GLUCOPHAGE-XR) 500 MG 24 hr tablet TAKE ONE TABLET BY MOUTH  EVERY MORNING 05/19/2022: States only taking as needed if "sugar is too high"   MULTAQ 400 MG tablet TAKE ONE TABLET BY MOUTH AT NOON and TAKE ONE TABLET BY MOUTH EVERY EVENING    Pirfenidone 801 MG TABS Take 1 tablet (801 mg total) by mouth 3 (three) times daily.    XARELTO 20 MG TABS tablet TAKE ONE TABLET BY MOUTH EVERY EVENING    No facility-administered encounter medications on file as of 07/16/2022.    Recent Office Vitals: BP Readings from Last 3 Encounters:  05/21/22 (!) 140/80  05/21/22 (!) 160/80  04/16/22 (!) 175/80   Pulse Readings from Last 3 Encounters:  05/21/22 64  05/21/22 (!) 55  04/16/22 (!) 56    Wt Readings from Last 3 Encounters:  05/21/22 183 lb 6.4 oz (83.2 kg)  05/21/22 182 lb 4.8 oz (82.7 kg)  04/16/22 182 lb (82.6 kg)     Kidney Function Lab Results  Component Value Date/Time   CREATININE 1.01 02/19/2022 02:04 PM   CREATININE 0.93 11/26/2021 03:23 PM   CREATININE 0.93 12/01/2019 10:25 AM   GFR 54.50 (L) 02/19/2022 02:04 PM   GFRNONAA 58 (L) 04/17/2021 06:15 AM   GFRAA >60 11/04/2017 04:10 AM       Latest Ref Rng & Units 02/19/2022    2:04 PM 11/26/2021    3:23 PM 09/18/2021   10:29 AM  BMP  Glucose 70 - 99 mg/dL 94   62   BUN 6 - 23 mg/dL 19  25  18    Creatinine 0.40 - 1.20 mg/dL 7.82  9.56  2.13   Sodium 135 - 145 mEq/L 139   134   Potassium 3.5 - 5.1 mEq/L 3.9   4.0   Chloride 96 - 112 mEq/L 101   101   CO2 19 - 32 mEq/L 33   31   Calcium 8.4 - 10.5 mg/dL 9.8   08.6        Pamala Duffel CMA Clinical Pharmacist Assistant [phone]

## 2022-07-16 NOTE — Telephone Encounter (Signed)
-----   Message from Sherrill Raring, [facility] sent at 07/16/2022 12:21 PM EDT ----- Regarding: Med Refill Requests Pt is requesting refills for the following medications:  Amlodipine 5mg  1 tab daily Multaq 400mg  1 tab at noon and 1 every evening Xarelto 20mg  1 tab every evening  Preferred Pharmacy: [facility] [address] [place]), [place] - [address]  Phone: [phone] Fax: [phone]   Thank you, Sherrill Raring Clinical Pharmacist [phone]

## 2022-07-16 NOTE — Telephone Encounter (Signed)
Okay to refill Multaq 400mg  1 tab at noon and 1 every evening ?

## 2022-07-20 ENCOUNTER — Telehealth: Payer: Self-pay | Admitting: Internal Medicine

## 2022-07-20 DIAGNOSIS — I480 Paroxysmal atrial fibrillation: Secondary | ICD-10-CM

## 2022-07-20 DIAGNOSIS — I10 Essential (primary) hypertension: Secondary | ICD-10-CM

## 2022-07-20 DIAGNOSIS — M19012 Primary osteoarthritis, left shoulder: Secondary | ICD-10-CM | POA: Diagnosis not present

## 2022-07-20 MED ORDER — AMLODIPINE BESYLATE 5 MG PO TABS: 5.0000 mg | ORAL_TABLET | Freq: Every day | ORAL | 1 refills | Status: DC

## 2022-07-20 MED ORDER — RIVAROXABAN 20 MG PO TABS
20.0000 mg | ORAL_TABLET | Freq: Every evening | ORAL | 1 refills | Status: DC
Start: 2022-07-20 — End: 2022-07-22

## 2022-07-20 MED ORDER — MULTAQ 400 MG PO TABS
400.0000 mg | ORAL_TABLET | Freq: Two times a day (BID) | ORAL | 1 refills | Status: DC
Start: 2022-07-20 — End: 2022-07-22

## 2022-07-20 NOTE — Telephone Encounter (Signed)
Prescription Request  07/20/2022  LOV: 05/21/2022  What is the name of the medication or equipment? Multaq, xarelto, and amlodipine. Pt states she is out of the Multaq and had very little left of the amlodipine.  Have you contacted your pharmacy to request a refill? Yes   Which pharmacy would you like this sent to? [facility] [address] ([address]), [place] - [address] [place]) [place] [address] Phone: [phone] Fax: [phone]   Patient notified that their request is being sent to the clinical staff for review and that they should receive a response within 2 business days.   Please advise at Mobile [phone] (mobile)

## 2022-07-20 NOTE — Telephone Encounter (Signed)
Refills sent

## 2022-07-21 ENCOUNTER — Telehealth: Payer: Self-pay | Admitting: *Deleted

## 2022-07-21 NOTE — Telephone Encounter (Signed)
   Pre-operative Risk Assessment    Patient Name: Miranda [name]  DOB: 24-Dec-1945 MRN: [id number]      Request for Surgical Clearance    Procedure:   LEFT REVERSE SHOULDER ARTHROPLASTY  Date of Surgery:  Clearance TBD                                 Surgeon:  DR. Caryn Bee SUPPLE Surgeon's Group or Practice Name:  [facility] Phone number:  [phone] Fax number:  [phone]   Type of Clearance Requested:   - Medical  - Pharmacy:  Hold Rivaroxaban (Xarelto) NOT INDICATED HOW LONG   Type of Anesthesia:  General    Additional requests/questions:    Wilhemina Cash   07/21/2022, 9:12 AM

## 2022-07-22 ENCOUNTER — Telehealth: Payer: Self-pay | Admitting: Cardiology

## 2022-07-22 ENCOUNTER — Telehealth: Payer: Self-pay | Admitting: *Deleted

## 2022-07-22 ENCOUNTER — Telehealth: Payer: Self-pay | Admitting: Internal Medicine

## 2022-07-22 ENCOUNTER — Other Ambulatory Visit: Payer: Self-pay

## 2022-07-22 DIAGNOSIS — I480 Paroxysmal atrial fibrillation: Secondary | ICD-10-CM

## 2022-07-22 MED ORDER — MULTAQ 400 MG PO TABS
400.0000 mg | ORAL_TABLET | Freq: Two times a day (BID) | ORAL | 0 refills | Status: DC
Start: 2022-07-22 — End: 2023-08-03

## 2022-07-22 MED ORDER — RIVAROXABAN 20 MG PO TABS
20.0000 mg | ORAL_TABLET | Freq: Every evening | ORAL | 0 refills | Status: DC
Start: 2022-07-22 — End: 2022-08-06

## 2022-07-22 NOTE — Telephone Encounter (Signed)
Pt called to report that she cannot afford her Multaq and her Xarelto..she has been out of her Multaq for a week and she still has one week left of Xarelto.  I gave her pt assistance information for both medications and I left a month supply of each at the front desk for her to pick up today.   She is having a virtual visit for preop clearance on 07/28/22. I advised her that she needs to see EP soon since she is overdue.   She is reluctant to make an appt soon since she is trying to have her shoulder replacement.   I will forward to the schedulers to see if we can get her an appt early summer but enough time to have her surgery if the preop APP does not feel that she needs to be seen presurgery.    Forward to the Pt assistance nurse for help with her meds.   I will forward to Dr Camitz/ nurse for review of her being out of her Multaq for a week and restarting and if any implications that possibly need to be addressed.

## 2022-07-22 NOTE — Telephone Encounter (Signed)
Patient is aware 

## 2022-07-22 NOTE — Telephone Encounter (Signed)
Scheduled pt's telephone appt.    Patient Consent for Virtual Visit         Miranda [name] has provided verbal consent on 07/22/2022 for a virtual visit (video or telephone).   CONSENT FOR VIRTUAL VISIT FOR:  Miranda [name]  By participating in this virtual visit I agree to the following:  I hereby voluntarily request, consent and authorize [facility] and its employed or contracted physicians, physician assistants, nurse practitioners or other licensed health care professionals (the Practitioner), to provide me with telemedicine health care services (the "Services") as deemed necessary by the treating Practitioner. I acknowledge and consent to receive the Services by the Practitioner via telemedicine. I understand that the telemedicine visit will involve communicating with the Practitioner through live audiovisual communication technology and the disclosure of certain medical information by electronic transmission. I acknowledge that I have been given the opportunity to request an in-person assessment or other available alternative prior to the telemedicine visit and am voluntarily participating in the telemedicine visit.  I understand that I have the right to withhold or withdraw my consent to the use of telemedicine in the course of my care at any time, without affecting my right to future care or treatment, and that the Practitioner or I may terminate the telemedicine visit at any time. I understand that I have the right to inspect all information obtained and/or recorded in the course of the telemedicine visit and may receive copies of available information for a reasonable fee.  I understand that some of the potential risks of receiving the Services via telemedicine include:  Delay or interruption in medical evaluation due to technological equipment failure or disruption; Information transmitted may not be sufficient (e.g. poor resolution of images) to allow for appropriate  medical decision making by the Practitioner; and/or  In rare instances, security protocols could fail, causing a breach of personal health information.  Furthermore, I acknowledge that it is my responsibility to provide information about my medical history, conditions and care that is complete and accurate to the best of my ability. I acknowledge that Practitioner's advice, recommendations, and/or decision may be based on factors not within their control, such as incomplete or inaccurate data provided by me or distortions of diagnostic images or specimens that may result from electronic transmissions. I understand that the practice of medicine is not an exact science and that Practitioner makes no warranties or guarantees regarding treatment outcomes. I acknowledge that a copy of this consent can be made available to me via my patient portal [facility] MyChart), or I can request a printed copy by calling the office of [facility].    I understand that my insurance will be billed for this visit.   I have read or had this consent read to me. I understand the contents of this consent, which adequately explains the benefits and risks of the Services being provided via telemedicine.  I have been provided ample opportunity to ask questions regarding this consent and the Services and have had my questions answered to my satisfaction. I give my informed consent for the services to be provided through the use of telemedicine in my medical care

## 2022-07-22 NOTE — Telephone Encounter (Signed)
Patient with diagnosis of PAF on Xarelto for anticoagulation.    Procedure: LEFT REVERSE SHOULDER ARTHROPLASTY Date of procedure: TBD   CHA2DS2-VASc Score = 6   This indicates a 9.7% annual risk of stroke. The patient's score is based upon: CHF History: 0 HTN History: 1 Diabetes History: 1 Stroke History: 0 Vascular Disease History: 1 Age Score: 2 Gender Score: 1    CrCl 63 mL/min (SrCr.1.01 02/19/2022) Platelet count 175 K   Per office protocol, patient can hold Xarelto for 3 days prior to procedure.     **This guidance is not considered finalized until pre-operative APP has relayed final recommendations.**

## 2022-07-22 NOTE — Telephone Encounter (Signed)
Left message on machine for patient to return our call 

## 2022-07-22 NOTE — Telephone Encounter (Signed)
Pt called to say the pharmacy is trying to charge her $500 for the dronedarone (MULTAQ) 400 MG tablet  Pt is asking for a more affordable alternative, asap.  [facility] [address] ([address] [place] - [address] Phone: [phone]  Fax: [phone]

## 2022-07-22 NOTE — Telephone Encounter (Signed)
Pt c/o medication issue:  1. Name of Medication:   dronedarone (MULTAQ) 400 MG tablet    2. How are you currently taking this medication (dosage and times per day)? Take 1 tablet (400 mg total) by mouth 2 (two) times daily with a meal.   3. Are you having a reaction (difficulty breathing--STAT)? No  4. What is your medication issue? Pt would like to know if another medication is able to be prescribed due to current medication being too expensive. Pt says that she has been pout of medication for a week now. Please advise

## 2022-07-22 NOTE — Telephone Encounter (Signed)
   Name: Miranda [name]  DOB: 1946-01-19  MRN: [id number]  Primary Cardiologist: Peter Swaziland, MD   Preoperative team, please contact this patient and set up a phone call appointment for further preoperative risk assessment. Please obtain consent and complete medication review. Thank you for your help.  I confirm that guidance regarding antiplatelet and oral anticoagulation therapy has been completed and, if necessary, noted below. Per pharm D: Patient with diagnosis of PAF on Xarelto for anticoagulation.     Procedure: LEFT REVERSE SHOULDER ARTHROPLASTY Date of procedure: TBD     CHA2DS2-VASc Score = 6   This indicates a 9.7% annual risk of stroke. The patient's score is based upon: CHF History: 0 HTN History: 1 Diabetes History: 1 Stroke History: 0 Vascular Disease History: 1 Age Score: 2 Gender Score: 1     CrCl 63 mL/min (SrCr.1.01 02/19/2022) Platelet count 175 K     Per office protocol, patient can hold Xarelto for 3 days prior to procedure.    Carlos Levering, NP 07/22/2022, 11:13 AM [facility]

## 2022-07-28 ENCOUNTER — Ambulatory Visit: Payer: PPO | Attending: Cardiology

## 2022-07-28 DIAGNOSIS — Z01810 Encounter for preprocedural cardiovascular examination: Secondary | ICD-10-CM

## 2022-07-28 NOTE — Telephone Encounter (Signed)
error 

## 2022-07-28 NOTE — Progress Notes (Signed)
Virtual Visit via Telephone Note   Because of Miranda [name]'s co-morbid illnesses, she is at least at moderate risk for complications without adequate follow up.  This format is felt to be most appropriate for this patient at this time.  The patient did not have access to video technology/had technical difficulties with video requiring transitioning to audio format only (telephone).  All issues noted in this document were discussed and addressed.  No physical exam could be performed with this format.  Please refer to the patient's chart for her consent to telehealth for [facility].  Evaluation Performed:  Preoperative cardiovascular risk assessment _____________   Date:  07/28/2022   Patient ID:  Miranda [name], DOB Mar 11, 1946, MRN [id number] Patient Location:  Home Provider location:   Office  Primary Care Provider:  Philip Aspen, Limmie Patricia, MD Primary Cardiologist:  Peter Swaziland, MD  Chief Complaint / Patient Profile   77 y.o. y/o female with a h/o hypertension, hyperlipidemia, OSA on CPAP, diabetes mellitus type 2, and atrial fibrillation who is pending left reverse shoulder arthroplasty and presents today for telephonic preoperative cardiovascular risk assessment.  History of Present Illness    Miranda [name] is a 77 y.o. female who presents via audio/video conferencing for a telehealth visit today.  Pt was last seen in cardiology clinic on 10/14/2021 by Dr. Swaziland.  At that time Miranda [name] was doing well .  The patient is now pending procedure as outlined above. Since her last visit, she tells me that she recently ran out of her medication but she was able to get some samples from our office.  Her insurance stopped paying for her medications and she is now in the donut hole.  She tells me she has been in atrial fibrillation a couple of times but it did not last very long.  Once she calmed down her symptoms subsided.  She did have some shortness of breath with  these episodes.  She tells me the first time she had a little bit of pressure in her chest but it went away quickly.  She also tells me that when she is walking around at [facility] she gets really short of breath so now she uses the mobility scooter to get around.  She states that this is about the same as it has been and her symptoms occur on and off.  She does meet 4 METS on the DASI today (4.4).  She has an appointment with Dr. Elberta Fortis in August.  Per office protocol, patient can hold Xarelto for 3 days prior to procedure.   Past Medical History    Past Medical History:  Diagnosis Date   Anxiety    Arthritis    Asthma    Atrial fibrillation (HCC)    CHF (congestive heart failure) (HCC)    pt. unsure- but thinks she was hosp. for CHF- 2002   Chronic kidney disease    [facility]   Clotting disorder (HCC)    blood clots in lungs/PE pulmonary embolism   Colon polyps    Complication of anesthesia    states requires a lot med. to put her to sleep    DDD (degenerative disc disease) 09/17/2011   Depression    "sometimes "   DM (diabetes mellitus) (HCC)    Family history of anesthesia complication    Sister had difficulty waking up from anesthesia   GERD (gastroesophageal reflux disease)    Glaucoma    bilateral, pt. admits  that she is noncompliant to eye gtts.    Hemorrhoids    Hyperlipidemia    Hypertension    had stress, echo- 2006 /w [place], Cardiac Cath, per pt. 2002, echo repeated 2012- wnl    IPF (idiopathic pulmonary fibrosis) (HCC)    Low back pain    Shortness of breath    Sleep apnea    uses c-pap- q night recently   Past Surgical History:  Procedure Laterality Date   ABDOMINAL HYSTERECTOMY     ectopic, fibroids   ANTERIOR CERVICAL DECOMP/DISCECTOMY FUSION N/A 09/11/2020   Procedure: Anterior Cervical Decompression Fusion Cervical four-five, Cervical five-six;  Surgeon: Bedelia Person, MD;  Location[facility] OR;  Service: Neurosurgery;  Laterality: N/A;    arm surgery Right    CARDIAC CATHETERIZATION     CATARACT EXTRACTION, BILATERAL     cataracts removed bilateral- ?IOL   COLONOSCOPY     remote   FISSURECTOMY  10/08/2011   Procedure: FISSURECTOMY;  Surgeon: Almond Lint, MD;  Location: [facility] OR;  Service: General;  Laterality: N/A;   FLEXIBLE SIGMOIDOSCOPY  02/25/2011   Procedure: FLEXIBLE SIGMOIDOSCOPY;  Surgeon: Louis Meckel, MD;  Location: [facility] ENDOSCOPY;  Service: Endoscopy;  Laterality: N/A;   FOOT SURGERY     bilat, heel spurs- screw in R foot    HEMORRHOID SURGERY  10/08/2011   Procedure: HEMORRHOIDECTOMY;  Surgeon: Almond Lint, MD;  Location: [facility] OR;  Service: General;  Laterality: N/A;  External    ROTATOR CUFF REPAIR Right    SPHINCTEROTOMY  10/08/2011   Procedure: SPHINCTEROTOMY;  Surgeon: Almond Lint, MD;  Location: [facility] OR;  Service: General;  Laterality: N/A;    Allergies  Allergies  Allergen Reactions   Fish Oil Anaphylaxis   Other Hives, Shortness Of Breath and Swelling    Allergic to cashew nuts and peanut oil.   Penicillins Anaphylaxis, Hives and Swelling    Has patient had a PCN reaction causing immediate rash, facial/tongue/throat swelling, SOB or lightheadedness with hypotension: Face swelling and hives started first, then swelling of the throat  Has patient had a PCN reaction causing severe rash involving mucus membranes or skin necrosis: Yes  Has patient had a PCN reaction that required hospitalization: No  Has patient had a PCN reaction occurring within the last 10 years: Yes  If all of the above answers are "NO", then may proceed with Cephalospor   Pneumococcal Vaccines Nausea And Vomiting   Cashew Nut Oil    Nitrofurantoin Macrocrystal Hives   Peanut Oil    Aspirin Itching and Rash   Bactrim [Sulfamethoxazole-Trimethoprim] Hives, Itching and Rash   Ciprofloxacin Hives, Itching and Rash   Ibuprofen Rash   Influenza Vaccines Hives   Ivp Dye [Iodinated Contrast Media] Hives, Itching and Rash    Gives  benadryl to counteract symptoms   Latex Rash   Macrobid [Nitrofurantoin Monohydrate Macrocrystals] Hives   Shellfish Allergy Hives    Patient also allergic to seafood    Home Medications    Prior to Admission medications   Medication Sig Start Date End Date Taking? Authorizing Provider  acetaminophen (TYLENOL) 325 MG tablet Take 650 mg by mouth every 6 (six) hours as needed for moderate pain.    [provider]  albuterol (VENTOLIN HFA) 108 (90 Base) MCG/ACT inhaler Inhale 1 puff into the lungs every 6 (six) hours as needed. 06/23/19   Philip Aspen, Limmie Patricia, MD  ALPRAZolam Prudy Feeler) 0.25 MG tablet Take 1 tablet (0.25 mg total) by mouth 3 (  three) times daily as needed. 06/25/22   Philip Aspen, Limmie Patricia, MD  amLODipine (NORVASC) 5 MG tablet Take 1 tablet (5 mg total) by mouth daily. 07/20/22   Philip Aspen, Limmie Patricia, MD  atorvastatin (LIPITOR) 80 MG tablet Take 1 tablet (80 mg total) by mouth every evening. 06/11/22   Philip Aspen, Limmie Patricia, MD  Continuous Blood Gluc Receiver (FREESTYLE LIBRE 2 READER) DEVI 1 each by Does not apply route daily. 08/28/21   Philip Aspen, Limmie Patricia, MD  Continuous Blood Gluc Sensor (FREESTYLE LIBRE 2 SENSOR) MISC 1 each by Does not apply route daily. 06/15/22   Philip Aspen, Limmie Patricia, MD  cyclobenzaprine (FLEXERIL) 5 MG tablet TAKE ONE TABLET BY MOUTH EVERY EVENING 03/03/22   Philip Aspen, Limmie Patricia, MD  dronedarone (MULTAQ) 400 MG tablet Take 1 tablet (400 mg total) by mouth 2 (two) times daily with a meal. 07/22/22   [name], Andree Coss, MD  ezetimibe (ZETIA) 10 MG tablet Take 1 tablet (10 mg total) by mouth daily. 06/11/22   Philip Aspen, Limmie Patricia, MD  hydrochlorothiazide (HYDRODIURIL) 25 MG tablet Take 1 tablet (25 mg total) by mouth daily. 06/11/22   Philip Aspen, Limmie Patricia, MD  HYDROcodone-acetaminophen [facility]) 10-325 MG tablet Take 1 tablet by mouth every 6 (six) hours as needed for pain. 03/25/21   [provider]   lactulose, encephalopathy, (ENULOSE) 10 GM/15ML SOLN take BY MOUTH three times daily AS NEEDED FOR mild constipation 08/06/21   Philip Aspen, Limmie Patricia, MD  linaclotide [facility]) 145 MCG CAPS capsule TAKE ONE CAPSULE BY MOUTH BEFORE BREAKFAST 04/03/22   Sherrilyn Rist, MD  losartan (COZAAR) 100 MG tablet Take 1 tablet (100 mg total) by mouth every evening. 06/25/22   Philip Aspen, Limmie Patricia, MD  metFORMIN (GLUCOPHAGE-XR) 500 MG 24 hr tablet TAKE ONE TABLET BY MOUTH EVERY MORNING 01/13/22   Philip Aspen, Limmie Patricia, MD  Pirfenidone 801 MG TABS Take 1 tablet (801 mg total) by mouth 3 (three) times daily. 05/29/22   Mannam, Colbert Coyer, MD  rivaroxaban (XARELTO) 20 MG TABS tablet Take 1 tablet (20 mg total) by mouth every evening. 07/22/22   [name], Andree Coss, MD    Physical Exam    Vital Signs:  Valli Glance does not have vital signs available for review today.  Given telephonic nature of communication, physical exam is limited. AAOx3. NAD. Normal affect.  Speech and respirations are unlabored.  Accessory Clinical Findings    None  Assessment & Plan    1.  Preoperative Cardiovascular Risk Assessment:  Ms. Curcuru perioperative risk of a major cardiac event is 0.4% according to the Revised Cardiac Risk Index (RCRI).  Therefore, she is at low risk for perioperative complications.   Her functional capacity is fair at 4.4 METs according to the [facility] Activity Status Index (DASI). Recommendations: According to ACC/AHA guidelines, no further cardiovascular testing needed.  The patient may proceed to surgery at acceptable risk.   Antiplatelet and/or Anticoagulation Recommendations:  Xarelto (Rivaroxaban) can be held for 3 days prior to surgery.  Please resume post op when felt to be safe.     The patient was advised that if she develops new symptoms prior to surgery to contact our office to arrange for a follow-up visit, and she verbalized understanding.   A copy of this note  will be routed to requesting surgeon.  Time:   Today, I have spent 15 minutes with the patient with telehealth technology discussing medical history,  symptoms, and management plan.     Sharlene Dory, PA-C  07/28/2022, 10:04 AM

## 2022-07-31 ENCOUNTER — Telehealth: Payer: Self-pay | Admitting: Pulmonary Disease

## 2022-07-31 NOTE — Telephone Encounter (Signed)
Fax received from Dr. Francena Hanly with [facility] to perform a Lt Reverse Shoulder Athroplasty on patient.  Patient needs surgery clearance. Surgery is 08/13/22. Patient was seen on 05/21/22. Office protocol is a risk assessment can be sent to surgeon if patient has been seen in 60 days or less.   Sending to Dr. Isaiah Serge for risk assessment or recommendations if patient needs to be seen in office prior to surgical procedure.

## 2022-08-03 NOTE — Progress Notes (Addendum)
COVID Vaccine Completed:  Yes  Date of COVID positive in last 90 days:  PCP -Chaya Jan, MD Cardiologist - Peter Swaziland, MD Electrophysiologist - Loman Brooklyn, MD Pulmonologist - Chilton Greathouse, MD  Cardiac clearance in Epic dated 07-28-22 by Jari Favre, PA-C  Chest x-ray -  CT chest 10-24-21 epic EKG -  Stress Test - 11-12-17 Epic ECHO - 04-14-21 Epic Cardiac Cath -  Pacemaker/ICD device last checked: Spinal Cord Stimulator: Holter Monitor - 03-17-18 Epic  Bowel Prep -   Sleep Study - Yes, +sleep apnea CPAP -   Fasting Blood Sugar -  Checks Blood Sugar _____ times a day  Last dose of GLP1 agonist-  N/A GLP1 instructions:  N/A   Last dose of SGLT-2 inhibitors-  N/A SGLT-2 instructions: N/A   Blood Thinner Instructions:  Xarelto.  Okay to hold x3 days per clearance Aspirin Instructions: Last Dose:  Activity level:  Can go up a flight of stairs and perform activities of daily living without stopping and without symptoms of chest pain or shortness of breath.  Able to exercise without symptoms  Unable to go up a flight of stairs without symptoms of     Anesthesia review:  Afib, LVH, ILD, pulmonary fibrosis,  HTN, DM, OSA, hx of PE  Patient denies shortness of breath, fever, cough and chest pain at PAT appointment  Patient verbalized understanding of instructions that were given to them at the PAT appointment. Patient was also instructed that they will need to review over the PAT instructions again at home before surgery.

## 2022-08-03 NOTE — Progress Notes (Signed)
COVID Vaccine Completed:  Date of COVID positive in last 90 days:  PCP -  Cardiologist -   Chest x-ray -  EKG -  Stress Test -  ECHO -  Cardiac Cath -  Pacemaker/ICD device last checked: Spinal Cord Stimulator:  Bowel Prep -   Sleep Study -  CPAP -   Fasting Blood Sugar -  Checks Blood Sugar _____ times a day  Last dose of GLP1 agonist-  N/A GLP1 instructions:  N/A   Last dose of SGLT-2 inhibitors-  N/A SGLT-2 instructions: N/A   Blood Thinner Instructions:  Time Aspirin Instructions: Last Dose:  Activity level:  Can go up a flight of stairs and perform activities of daily living without stopping and without symptoms of chest pain or shortness of breath.  Able to exercise without symptoms  Unable to go up a flight of stairs without symptoms of     Anesthesia review:   Patient denies shortness of breath, fever, cough and chest pain at PAT appointment  Patient verbalized understanding of instructions that were given to them at the PAT appointment. Patient was also instructed that they will need to review over the PAT instructions again at home before surgery.

## 2022-08-03 NOTE — Patient Instructions (Addendum)
SURGICAL WAITING ROOM VISITATION Patients having surgery or a procedure may have no more than 2 support people in the waiting area - these visitors may rotate.    Children under the age of 62 must have an adult with them who is not the patient.  If the patient needs to stay at the hospital during part of their recovery, the visitor guidelines for inpatient rooms apply. Pre-op nurse will coordinate an appropriate time for 1 support person to accompany patient in pre-op.  This support person may not rotate.    Please refer to the [facility] website for the visitor guidelines for Inpatients (after your surgery is over and you are in a regular room).       Your procedure is scheduled on: 08-13-22   Report to [facility] Main Entrance    Report to admitting at 10:30 AM   Call this number if you have problems the morning of surgery [phone]   Do not eat food :After Midnight.   After Midnight you may have the following liquids until 10:00 AM DAY OF SURGERY  Water Non-Citrus Juices (without pulp, NO RED-Apple, White grape, White cranberry) Black Coffee (NO MILK/CREAM OR CREAMERS, sugar ok)  Clear Tea (NO MILK/CREAM OR CREAMERS, sugar ok) regular and decaf                             Plain Jell-O (NO RED)                                           Fruit ices (not with fruit pulp, NO RED)                                     Popsicles (NO RED)                                                               Sports drinks like Gatorade (NO RED)                   The day of surgery:  Drink ONE (1) Pre-Surgery G2 at 10:00 AM the morning of surgery. Drink in one sitting. Do not sip.  This drink was given to you during your hospital  pre-op appointment visit. Nothing else to drink after completing the Pre-Surgery G2.          If you have questions, please contact your surgeon's office.   FOLLOW  ANY ADDITIONAL PRE OP INSTRUCTIONS YOU RECEIVED FROM YOUR SURGEON'S OFFICE!!!      Oral Hygiene is also important to reduce your risk of infection.                                    Remember - BRUSH YOUR TEETH THE MORNING OF SURGERY WITH YOUR REGULAR TOOTHPASTE   Do NOT smoke after Midnight   Take these medicines the morning of surgery with A SIP OF WATER:   Alprazolam  Amlodipine  Multaq  Ezetimibe  Pirfenidone  Tylenol or Hydrocodone if needed  How to Manage Your Diabetes Before and After Surgery  Why is it important to control my blood sugar before and after surgery? Improving blood sugar levels before and after surgery helps healing and can limit problems. A way of improving blood sugar control is eating a healthy diet by:  Eating less sugar and carbohydrates  Increasing activity/exercise  Talking with your doctor about reaching your blood sugar goals High blood sugars (greater than 180 mg/dL) can raise your risk of infections and slow your recovery, so you will need to focus on controlling your diabetes during the weeks before surgery. Make sure that the doctor who takes care of your diabetes knows about your planned surgery including the date and location.  How do I manage my blood sugar before surgery? Check your blood sugar at least 4 times a day, starting 2 days before surgery, to make sure that the level is not too high or low. Check your blood sugar the morning of your surgery when you wake up and every 2 hours until you get to the Short Stay unit. If your blood sugar is less than 70 mg/dL, you will need to treat for low blood sugar: Do not take insulin. Treat a low blood sugar (less than 70 mg/dL) with  cup of clear juice (cranberry or apple), 4 glucose tablets, OR glucose gel. Recheck blood sugar in 15 minutes after treatment (to make sure it is greater than 70 mg/dL). If your blood sugar is not greater than 70 mg/dL on recheck, call [phone] for further instructions. Report your blood sugar to the short stay nurse when you get to Short  Stay.  If you are admitted to the hospital after surgery: Your blood sugar will be checked by the staff and you will probably be given insulin after surgery (instead of oral diabetes medicines) to make sure you have good blood sugar levels. The goal for blood sugar control after surgery is 80-180 mg/dL.   WHAT DO I DO ABOUT MY DIABETES MEDICATION?  Do not take oral diabetes medicines (pills) the morning of surgery.  DO NOT TAKE THE FOLLOWING 7 DAYS PRIOR TO SURGERY: Ozempic, Wegovy, Rybelsus (Semaglutide), Byetta (exenatide), Bydureon (exenatide ER), Victoza, Saxenda (liraglutide), or Trulicity (dulaglutide) Mounjaro (Tirzepatide) Adlyxin (Lixisenatide), Polyethylene Glycol Loxenatide.  Reviewed and Endorsed by [facility] Patient Education Committee, August 2015  Bring CPAP mask and tubing day of surgery.                              You may not have any metal on your body including hair pins, jewelry, and body piercing             Do not wear make-up, lotions, powders, perfumes, or deodorant  Do not wear nail polish including gel and S&S, artificial/acrylic nails, or any other type of covering on natural nails including finger and toenails. If you have artificial nails, gel coating, etc. that needs to be removed by a nail salon please have this removed prior to surgery or surgery may need to be canceled/ delayed if the surgeon/ anesthesia feels like they are unable to be safely monitored.   Do not shave  48 hours prior to surgery.       Do not bring valuables to the hospital. [facility] IS NOT RESPONSIBLE   FOR VALUABLES.   Contacts, dentures or bridgework may not be worn into surgery.  DO NOT BRING YOUR HOME MEDICATIONS TO THE HOSPITAL. PHARMACY WILL DISPENSE MEDICATIONS LISTED ON YOUR MEDICATION LIST TO YOU DURING YOUR ADMISSION IN THE HOSPITAL!    Patients discharged on the day of surgery will not be allowed to drive home.  Someone NEEDS to stay with you for the first 24 hours  after anesthesia.   Special Instructions: Bring a copy of your healthcare power of attorney and living will documents the day of surgery if you haven't scanned them before.              Please read over the following fact sheets you were given: IF YOU HAVE QUESTIONS ABOUT YOUR PRE-OP INSTRUCTIONS PLEASE CALL [phone] Gwen  If you received a COVID test during your pre-op visit  it is requested that you wear a mask when out in public, stay away from anyone that may not be feeling well and notify your surgeon if you develop symptoms. If you test positive for Covid or have been in contact with anyone that has tested positive in the last 10 days please notify you surgeon.  [facility]- Preparing for Total Shoulder Arthroplasty    Before surgery, you can play an important role. Because skin is not sterile, your skin needs to be as free of germs as possible. You can reduce the number of germs on your skin by using the following products. Benzoyl Peroxide Gel Reduces the number of germs present on the skin Applied twice a day to shoulder area starting two days before surgery    ==================================================================  Please follow these instructions carefully:  BENZOYL PEROXIDE 5% GEL  Please do not use if you have an allergy to benzoyl peroxide.   If your skin becomes reddened/irritated stop using the benzoyl peroxide.  Starting two days before surgery, apply as follows: Apply benzoyl peroxide in the morning and at night. Apply after taking a shower. If you are not taking a shower clean entire shoulder front, back, and side along with the armpit with a clean wet washcloth.  Place a quarter-sized dollop on your shoulder and rub in thoroughly, making sure to cover the front, back, and side of your shoulder, along with the armpit.   2 days before ____ AM   ____ PM              1 day before ____ AM   ____ PM                         Do this twice a day for two days.   (Last application is the night before surgery, AFTER using the CHG soap as described below).  Do NOT apply benzoyl peroxide gel on the day of surgery.     Pre-operative 5 CHG Bath Instructions   You can play a key role in reducing the risk of infection after surgery. Your skin needs to be as free of germs as possible. You can reduce the number of germs on your skin by washing with CHG (chlorhexidine gluconate) soap before surgery. CHG is an antiseptic soap that kills germs and continues to kill germs even after washing.   DO NOT use if you have an allergy to chlorhexidine/CHG or antibacterial soaps. If your skin becomes reddened or irritated, stop using the CHG and notify one of our RNs at  [phone] .   Please shower with the CHG soap starting 4 days before surgery using the following schedule:     Please keep  in mind the following:  DO NOT shave, including legs and underarms, starting the day of your first shower.   You may shave your face at any point before/day of surgery.  Place clean sheets on your bed the day you start using CHG soap. Use a clean washcloth (not used since being washed) for each shower. DO NOT sleep with pets once you start using the CHG.   CHG Shower Instructions:  If you choose to wash your hair and private area, wash first with your normal shampoo/soap.  After you use shampoo/soap, rinse your hair and body thoroughly to remove shampoo/soap residue.  Turn the water OFF and apply about 3 tablespoons (45 ml) of CHG soap to a CLEAN washcloth.  Apply CHG soap ONLY FROM YOUR NECK DOWN TO YOUR TOES (washing for 3-5 minutes)  DO NOT use CHG soap on face, private areas, open wounds, or sores.  Pay special attention to the area where your surgery is being performed.  If you are having back surgery, having someone wash your back for you may be helpful. Wait 2 minutes after CHG soap is applied, then you may rinse off the CHG soap.  Pat dry with a clean towel  Put on  clean clothes/pajamas   If you choose to wear lotion, please use ONLY the CHG-compatible lotions on the back of this paper.     Additional instructions for the day of surgery: DO NOT APPLY any lotions, deodorants, cologne, or perfumes.   Put on clean/comfortable clothes.  Brush your teeth.  Ask your nurse before applying any prescription medications to the skin.      CHG Compatible Lotions   Aveeno Moisturizing lotion  Cetaphil Moisturizing Cream  Cetaphil Moisturizing Lotion  Clairol Herbal Essence Moisturizing Lotion, Dry Skin  Clairol Herbal Essence Moisturizing Lotion, Extra Dry Skin  Clairol Herbal Essence Moisturizing Lotion, Normal Skin  Curel Age Defying Therapeutic Moisturizing Lotion with Alpha Hydroxy  Curel Extreme Care Body Lotion  Curel Soothing Hands Moisturizing Hand Lotion  Curel Therapeutic Moisturizing Cream, Fragrance-Free  Curel Therapeutic Moisturizing Lotion, Fragrance-Free  Curel Therapeutic Moisturizing Lotion, Original Formula  Eucerin Daily Replenishing Lotion  Eucerin Dry Skin Therapy Plus Alpha Hydroxy Crme  Eucerin Dry Skin Therapy Plus Alpha Hydroxy Lotion  Eucerin Original Crme  Eucerin Original Lotion  Eucerin Plus Crme Eucerin Plus Lotion  Eucerin TriLipid Replenishing Lotion  Keri Anti-Bacterial Hand Lotion  Keri Deep Conditioning Original Lotion Dry Skin Formula Softly Scented  Keri Deep Conditioning Original Lotion, Fragrance Free Sensitive Skin Formula  Keri Lotion Fast Absorbing Fragrance Free Sensitive Skin Formula  Keri Lotion Fast Absorbing Softly Scented Dry Skin Formula  Keri Original Lotion  Keri Skin Renewal Lotion Keri Silky Smooth Lotion  Keri Silky Smooth Sensitive Skin Lotion  Nivea Body Creamy Conditioning Oil  Nivea Body Extra Enriched Lotion  Nivea Body Original Lotion  Nivea Body Sheer Moisturizing Lotion Nivea Crme  Nivea Skin Firming Lotion  NutraDerm 30 Skin Lotion  NutraDerm Skin Lotion  NutraDerm  Therapeutic Skin Cream  NutraDerm Therapeutic Skin Lotion  ProShield Protective Hand Cream  Provon moisturizing lotion   PATIENT SIGNATURE_________________________________  NURSE SIGNATURE__________________________________  ________________________________________________________________________    Miranda [name]  An incentive spirometer is a tool that can help keep your lungs clear and active. This tool measures how well you are filling your lungs with each breath. Taking long deep breaths may help reverse or decrease the chance of developing breathing (pulmonary) problems (especially infection) following: A long period of time  when you are unable to move or be active. BEFORE THE PROCEDURE  If the spirometer includes an indicator to show your best effort, your nurse or respiratory therapist will set it to a desired goal. If possible, sit up straight or lean slightly forward. Try not to slouch. Hold the incentive spirometer in an upright position. INSTRUCTIONS FOR USE  Sit on the edge of your bed if possible, or sit up as far as you can in bed or on a chair. Hold the incentive spirometer in an upright position. Breathe out normally. Place the mouthpiece in your mouth and seal your lips tightly around it. Breathe in slowly and as deeply as possible, raising the piston or the ball toward the top of the column. Hold your breath for 3-5 seconds or for as long as possible. Allow the piston or ball to fall to the bottom of the column. Remove the mouthpiece from your mouth and breathe out normally. Rest for a few seconds and repeat Steps 1 through 7 at least 10 times every 1-2 hours when you are awake. Take your time and take a few normal breaths between deep breaths. The spirometer may include an indicator to show your best effort. Use the indicator as a goal to work toward during each repetition. After each set of 10 deep breaths, practice coughing to be sure your lungs are clear. If  you have an incision (the cut made at the time of surgery), support your incision when coughing by placing a pillow or rolled up towels firmly against it. Once you are able to get out of bed, walk around indoors and cough well. You may stop using the incentive spirometer when instructed by your caregiver.  RISKS AND COMPLICATIONS Take your time so you do not get dizzy or light-headed. If you are in pain, you may need to take or ask for pain medication before doing incentive spirometry. It is harder to take a deep breath if you are having pain. AFTER USE Rest and breathe slowly and easily. It can be helpful to keep track of a log of your progress. Your caregiver can provide you with a simple table to help with this. If you are using the spirometer at home, follow these instructions: SEEK MEDICAL CARE IF:  You are having difficultly using the spirometer. You have trouble using the spirometer as often as instructed. Your pain medication is not giving enough relief while using the spirometer. You develop fever of 100.5 F (38.1 C) or higher. SEEK IMMEDIATE MEDICAL CARE IF:  You cough up bloody sputum that had not been present before. You develop fever of 102 F (38.9 C) or greater. You develop worsening pain at or near the incision site. MAKE SURE YOU:  Understand these instructions. Will watch your condition. Will get help right away if you are not doing well or get worse. Document Released: 07/13/2006 Document Revised: 05/25/2011 Document Reviewed: 09/13/2006 [facility] Patient Information 2014 [place], [place].   ________________________________________________________________________

## 2022-08-04 ENCOUNTER — Encounter: Payer: Self-pay | Admitting: Pulmonary Disease

## 2022-08-04 NOTE — Progress Notes (Signed)
Pulmonary pre op evaluation  Fax received from Dr. Francena Hanly with [facility] to perform a Lt Reverse Shoulder Athroplasty on patient.  Patient needs surgery clearance. Surgery is 08/13/22.  Patient has history of obstructive sleep apnea, pulmonary fibrosis on antifibrotic therapy, prior pulmonary embolism on anticoagulation paroxysmal atrial fibrillation.  PFTs in 2023 showed mild restriction with preserved diffusion capacity. Per ARISCAT risk index: She has low risk: 1.6% pulmonary complication rate  Peri-operative Assessment of Pulmonary Risk for Non-Thoracic Surgery:  ForMs. Mcdowall, risk of perioperative pulmonary complications is increased by:  Age greater than 65 years  Pulmonary fibrosis             Obstructive sleep apnea   Respiratory complications generally occur in 1% of ASA Class I patients, 5% of ASA Class II and 10% of ASA Class III-IV patients These complications rarely result in mortality and iclude postoperative pneumonia, atelectasis, pulmonary embolism, ARDS and increased time requiring postoperative mechanical ventilation.  Overall, I recommend proceeding with the surgery if the risk for respiratory complications are outweighed by the potential benefits. This will need to be discussed between the patient and surgeon.  To reduce risks of respiratory complications, I recommend: --Pre- and post-operative incentive spirometry performed frequently while awake --Inpatient use of currently prescribed positive-pressure for OSA whenever the patient is sleeping --Avoiding use of pancuronium during anesthesia.  Chilton Greathouse MD [facility] See Amion for pager  If no response to pager , please call [phone] until 7pm After 7:00 pm call Elink  [phone] 08/04/2022, 10:52 AM

## 2022-08-04 NOTE — Telephone Encounter (Signed)
Pulmonary assessment completed.  Please see separate note.

## 2022-08-04 NOTE — Telephone Encounter (Signed)
OV notes and clearance form have been faxed back to [facility]. Nothing further needed at this time. ?

## 2022-08-04 NOTE — Telephone Encounter (Signed)
**Note De-Identified Mang Hazelrigg Obfuscation** The pt states that she has her [employer] application ready for Multaq assistance and plans to bring it to [place] as soon as she can.  She is aware that pt assistance for Xarelto is hard to be approved for now since [employer] no longer offers Xarelto assistance.  We discussed her enrolling in the Xarelto andMe discount program but the cost through them is too expensive for her as well.  She and I discussed her switching to Eliquis as they have a patient assistance program, BMSPAF that is more likely to approve her. She is in agreement with this plan if ok with Dr Elberta Fortis.  She is aware that I am sending this message to Dr Elberta Fortis for advisement and that if ok with him for her to switch to Eliquis that I will call her back with instructions on switching from Xarelto to Eliquis and to let her know that we are leaving her a 30 day supply of Eliquis samples and a BMSPAF application in the front office at [facility] [address] in [place] (Dr [employer] office) for her to pick up.  I also advised her that we are leaving her a 90 day supply of Multaq in the front office at [address] in [place] (Dr Elvis Coil office) for her to pick up. She became tearful and expressed much gratitude for all of our assistance.

## 2022-08-05 ENCOUNTER — Other Ambulatory Visit: Payer: Self-pay

## 2022-08-05 ENCOUNTER — Encounter (HOSPITAL_COMMUNITY)
Admission: RE | Admit: 2022-08-05 | Discharge: 2022-08-05 | Disposition: A | Discharge status: Home / Self Care | Payer: PPO | Source: Ambulatory Visit | Attending: Orthopedic Surgery | Admitting: Orthopedic Surgery

## 2022-08-05 ENCOUNTER — Encounter (HOSPITAL_COMMUNITY): Payer: Self-pay

## 2022-08-05 ENCOUNTER — Telehealth: Payer: Self-pay | Admitting: Cardiology

## 2022-08-05 VITALS — BP 155/65 | HR 51 | Temp 98.4°F | Resp 16 | Ht 65.0 in | Wt 178.2 lb

## 2022-08-05 DIAGNOSIS — G4730 Sleep apnea, unspecified: Secondary | ICD-10-CM | POA: Diagnosis not present

## 2022-08-05 DIAGNOSIS — Z86711 Personal history of pulmonary embolism: Secondary | ICD-10-CM | POA: Diagnosis not present

## 2022-08-05 DIAGNOSIS — I4891 Unspecified atrial fibrillation: Secondary | ICD-10-CM | POA: Insufficient documentation

## 2022-08-05 DIAGNOSIS — E1122 Type 2 diabetes mellitus with diabetic chronic kidney disease: Secondary | ICD-10-CM | POA: Insufficient documentation

## 2022-08-05 DIAGNOSIS — I509 Heart failure, unspecified: Secondary | ICD-10-CM | POA: Diagnosis not present

## 2022-08-05 DIAGNOSIS — Z01818 Encounter for other preprocedural examination: Secondary | ICD-10-CM

## 2022-08-05 DIAGNOSIS — J84112 Idiopathic pulmonary fibrosis: Secondary | ICD-10-CM | POA: Diagnosis not present

## 2022-08-05 DIAGNOSIS — M75102 Unspecified rotator cuff tear or rupture of left shoulder, not specified as traumatic: Secondary | ICD-10-CM | POA: Insufficient documentation

## 2022-08-05 DIAGNOSIS — N189 Chronic kidney disease, unspecified: Secondary | ICD-10-CM | POA: Diagnosis not present

## 2022-08-05 DIAGNOSIS — I130 Hypertensive heart and chronic kidney disease with heart failure and stage 1 through stage 4 chronic kidney disease, or unspecified chronic kidney disease: Secondary | ICD-10-CM | POA: Diagnosis not present

## 2022-08-05 DIAGNOSIS — I10 Essential (primary) hypertension: Secondary | ICD-10-CM

## 2022-08-05 DIAGNOSIS — E119 Type 2 diabetes mellitus without complications: Secondary | ICD-10-CM

## 2022-08-05 HISTORY — DX: Anemia, unspecified: D64.9

## 2022-08-05 LAB — SURGICAL PCR SCREEN
MRSA, PCR: NEGATIVE
Staphylococcus aureus: NEGATIVE

## 2022-08-05 LAB — HEMOGLOBIN A1C
Hgb A1c MFr Bld: 6 % — ABNORMAL HIGH (ref 4.8–5.6)
Mean Plasma Glucose: 125.5 mg/dL

## 2022-08-05 LAB — CBC
HCT: 38.9 % (ref 36.0–46.0)
Hemoglobin: 12.8 g/dL (ref 12.0–15.0)
MCH: 29 pg (ref 26.0–34.0)
MCHC: 32.9 g/dL (ref 30.0–36.0)
MCV: 88 fL (ref 80.0–100.0)
Platelets: 176 10*3/uL (ref 150–400)
RBC: 4.42 MIL/uL (ref 3.87–5.11)
RDW: 13.4 % (ref 11.5–15.5)
WBC: 4.9 10*3/uL (ref 4.0–10.5)
nRBC: 0 % (ref 0.0–0.2)

## 2022-08-05 LAB — BASIC METABOLIC PANEL
Anion gap: 8 (ref 5–15)
BUN: 16 mg/dL (ref 8–23)
CO2: 26 mmol/L (ref 22–32)
Calcium: 9.3 mg/dL (ref 8.9–10.3)
Chloride: 101 mmol/L (ref 98–111)
Creatinine, Ser: 0.89 mg/dL (ref 0.44–1.00)
GFR, Estimated: >60 mL/min (ref >60)
Glucose, Bld: 93 mg/dL (ref 70–99)
Potassium: 3.5 mmol/L (ref 3.5–5.1)
Sodium: 135 mmol/L (ref 135–145)

## 2022-08-05 LAB — GLUCOSE, CAPILLARY: Glucose-Capillary: 85 mg/dL (ref 70–99)

## 2022-08-05 NOTE — Telephone Encounter (Signed)
Per Dr. Elberta Fortis ok to switch to Eliquis. She should be taking Eliquis 5 mg BID.  Will forward back  to refill team & Larita Fife to address as I am not in the office tomorrow.

## 2022-08-05 NOTE — Telephone Encounter (Signed)
Patient application dropped off for patient assistance on multaq Please fax to [phone] when complete  Doc will be in pink folder in pink folder

## 2022-08-05 NOTE — Telephone Encounter (Signed)
Pt's paperwork was faxed into database. Confirmation received.

## 2022-08-06 ENCOUNTER — Other Ambulatory Visit: Payer: Self-pay

## 2022-08-06 MED ORDER — APIXABAN 5 MG PO TABS: 5.0000 mg | ORAL_TABLET | Freq: Two times a day (BID) | ORAL | 6 refills | Status: DC

## 2022-08-06 NOTE — Telephone Encounter (Signed)
**Note De-Identified Brianne Maina Obfuscation** I have completed the providers page of the pts [employer] pt assistance application for Multaq.  Dr Eldridge Dace (DOD) signed and dated it in Dr [name]'s absence and I faxed all to [facility] and did receive confirmation that the fax sent successfully.

## 2022-08-06 NOTE — Anesthesia Preprocedure Evaluation (Addendum)
Anesthesia Evaluation  Patient identified by MRN, date of birth, ID band Patient awake    Reviewed: Allergy & Precautions, H&P , NPO status , Patient's Chart, lab work & pertinent test results  History of Anesthesia Complications (+) Family history of anesthesia reaction and history of anesthetic complications  Airway Mallampati: II   Neck ROM: full    Dental   Pulmonary shortness of breath, asthma , sleep apnea , pneumonia Idiopathic pulmonary fibrosis   breath sounds clear to auscultation       Cardiovascular hypertension, Pt. on medications + CAD and +CHF  + dysrhythmias Atrial Fibrillation  Rhythm:regular Rate:Normal  Echo 03/2021  1. Left ventricular ejection fraction, by estimation, is 55 to 60%. The  left ventricle has normal function. The left ventricle has no regional  wall motion abnormalities. Left ventricular diastolic parameters are  consistent with Grade I diastolic  dysfunction (impaired relaxation).   2. Right ventricular systolic function is normal. The right ventricular  size is normal. There is normal pulmonary artery systolic pressure.   3. The mitral valve is normal in structure. No evidence of mitral valve  regurgitation. No evidence of mitral stenosis.   4. The aortic valve is normal in structure. Aortic valve regurgitation is  not visualized. Aortic valve sclerosis/calcification is present, without  any evidence of aortic stenosis.   5. The inferior vena cava is normal in size with greater than 50%  respiratory variability, suggesting right atrial pressure of 3 mmHg.     Neuro/Psych  PSYCHIATRIC DISORDERS Anxiety Depression       GI/Hepatic ,GERD  ,,  Endo/Other  diabetes, Type 2    Renal/GU Renal disease     Musculoskeletal  (+) Arthritis ,    Abdominal   Peds  Hematology  (+) Blood dyscrasia, anemia   Anesthesia Other Findings   Reproductive/Obstetrics                              Anesthesia Physical Anesthesia Plan  ASA: 3  Anesthesia Plan: General   Post-op Pain Management: Ofirmev IV (intra-op)* and Regional block*   Induction: Intravenous  PONV Risk Score and Plan: 3 and Ondansetron, Dexamethasone and Treatment may vary due to age or medical condition  Airway Management Planned: Oral ETT  Additional Equipment:   Intra-op Plan:   Post-operative Plan: Possible Post-op intubation/ventilation  Informed Consent: I have reviewed the patients History and Physical, chart, labs and discussed the procedure including the risks, benefits and alternatives for the proposed anesthesia with the patient or authorized representative who has indicated his/her understanding and acceptance.     Dental advisory given  Plan Discussed with: CRNA  Anesthesia Plan Comments: (See PAT note 08/05/22)        Anesthesia Quick Evaluation

## 2022-08-06 NOTE — Progress Notes (Addendum)
Anesthesia Chart Review   Case: [id number] Date/Time: 08/13/22 1245   Procedure: REVERSE SHOULDER ARTHROPLASTY (Left: Shoulder) -   Anesthesia type: General   Pre-op diagnosis: Left shoulder rotator cuff tear arthropathy   Location: [facility] ROOM 06 / [facility]   Surgeons: Francena Hanly, MD       DISCUSSION:77 y.o. never smoker with h/o HTN, sleep apnea, DM II, atrial fibrillation, CHF, pulmonary fibrosis, PE, CKD, left shoulder rotator cuff tear arthropathy scheduled for above procedure 08/13/2022 with Dr. Francena Hanly.   Per pulmonology preoperative evaluation 08/04/2022, "Respiratory complications generally occur in 1% of ASA Class I patients, 5% of ASA Class II and 10% of ASA Class III-IV patients These complications rarely result in mortality and iclude postoperative pneumonia, atelectasis, pulmonary embolism, ARDS and increased time requiring postoperative mechanical ventilation. Overall, I recommend proceeding with the surgery if the risk for respiratory complications are outweighed by the potential benefits. This will need to be discussed between the patient and surgeon. To reduce risks of respiratory complications, I recommend: --Pre- and post-operative incentive spirometry performed frequently while awake --Inpatient use of currently prescribed positive-pressure for OSA whenever the patient is sleeping --Avoiding use of pancuronium during anesthesia."  Per cardiology preoperative evaluation 07/28/2022, "Ms. Miranda [name]'s perioperative risk of a major cardiac event is 0.4% according to the Revised Cardiac Risk Index (RCRI).  Therefore, she is at low risk for perioperative complications.   Her functional capacity is fair at 4.4 METs according to the Duke Activity Status Index (DASI). Recommendations: According to ACC/AHA guidelines, no further cardiovascular testing needed.  The patient may proceed to surgery at acceptable risk.   Antiplatelet and/or Anticoagulation Recommendations: Xarelto  (Rivaroxaban) can be held for 3 days prior to surgery.  Please resume post op when felt to be safe."  Pt reports her last dose of Xarelto will be 08/09/22 PM dose.   Anticipate pt can proceed with planned procedure barring acute status change.   VS: BP (!) 155/65   Pulse (!) 51   Temp 36.9 C (Oral)   Resp 16   Ht 5\' 5"  (1.651 m)   Wt 80.8 kg   SpO2 100%   BMI 29.65 kg/m   PROVIDERS: Philip Aspen, Limmie Patricia, MD is PCP   Cardiologist - Peter Swaziland, MD  Electrophysiologist - Loman Brooklyn, MD  Pulmonologist - Chilton Greathouse, MD LABS: Labs reviewed: Acceptable for surgery. (all labs ordered are listed, but only abnormal results are displayed)  Labs Reviewed  HEMOGLOBIN A1C - Abnormal; Notable for the following components:      Result Value   Hgb A1c MFr Bld 6.0 (*)    All other components within normal limits  SURGICAL PCR SCREEN  BASIC METABOLIC PANEL  CBC  GLUCOSE, CAPILLARY     IMAGES:   EKG:   CV: Echo 04/14/2021 1. Left ventricular ejection fraction, by estimation, is 55 to 60%. The  left ventricle has normal function. The left ventricle has no regional  wall motion abnormalities. Left ventricular diastolic parameters are  consistent with Grade I diastolic  dysfunction (impaired relaxation).   2. Right ventricular systolic function is normal. The right ventricular  size is normal. There is normal pulmonary artery systolic pressure.   3. The mitral valve is normal in structure. No evidence of mitral valve  regurgitation. No evidence of mitral stenosis.   4. The aortic valve is normal in structure. Aortic valve regurgitation is  not visualized. Aortic valve sclerosis/calcification is present, without  any evidence of aortic  stenosis.   5. The inferior vena cava is normal in size with greater than 50%  respiratory variability, suggesting right atrial pressure of 3 mmHg.  Past Medical History:  Diagnosis Date   Anemia    Anxiety    Arthritis    Asthma     Atrial fibrillation (HCC)    CHF (congestive heart failure) (HCC)    pt. unsure- but thinks she was hosp. for CHF- 2002   Chronic kidney disease    [facility]   Clotting disorder (HCC)    blood clots in lungs/PE pulmonary embolism   Colon polyps    Complication of anesthesia    states requires a lot med. to put her to sleep    DDD (degenerative disc disease) 09/17/2011   Depression    "sometimes "   DM (diabetes mellitus) (HCC)    Family history of anesthesia complication    Sister had difficulty waking up from anesthesia   GERD (gastroesophageal reflux disease)    Glaucoma    bilateral, pt. admits that she is noncompliant to eye gtts.    Hemorrhoids    Hyperlipidemia    Hypertension    had stress, echo- 2006 /w [place], Cardiac Cath, per pt. 2002, echo repeated 2012- wnl    IPF (idiopathic pulmonary fibrosis) (HCC)    Low back pain    Shortness of breath    Sleep apnea    uses c-pap- q night recently    Past Surgical History:  Procedure Laterality Date   ABDOMINAL HYSTERECTOMY     ectopic, fibroids   ANTERIOR CERVICAL DECOMP/DISCECTOMY FUSION N/A 09/11/2020   Procedure: Anterior Cervical Decompression Fusion Cervical four-five, Cervical five-six;  Surgeon: Bedelia Person, MD;  Location: [facility];  Service: Neurosurgery;  Laterality: N/A;   arm surgery Right    CARDIAC CATHETERIZATION     CATARACT EXTRACTION, BILATERAL     cataracts removed bilateral- ?IOL   COLONOSCOPY     remote   FISSURECTOMY  10/08/2011   Procedure: FISSURECTOMY;  Surgeon: Almond Lint, MD;  Location: [facility];  Service: General;  Laterality: N/A;   FLEXIBLE SIGMOIDOSCOPY  02/25/2011   Procedure: FLEXIBLE SIGMOIDOSCOPY;  Surgeon: Louis Meckel, MD;  [facility];  Service: Endoscopy;  Laterality: N/A;   FOOT SURGERY     bilat, heel spurs- screw in R foot    HEMORRHOID SURGERY  10/08/2011   Procedure: HEMORRHOIDECTOMY;  Surgeon: Almond Lint, MD;  Location: [facility];  Service:  General;  Laterality: N/A;  External    ROTATOR CUFF REPAIR Right    SPHINCTEROTOMY  10/08/2011   Procedure: SPHINCTEROTOMY;  Surgeon: Almond Lint, MD;  Location: [facility];  Service: General;  Laterality: N/A;    MEDICATIONS:  apixaban (ELIQUIS) 5 MG TABS tablet   acetaminophen (TYLENOL) 325 MG tablet   albuterol (VENTOLIN HFA) 108 (90 Base) MCG/ACT inhaler   ALPRAZolam (XANAX) 0.25 MG tablet   amLODipine (NORVASC) 5 MG tablet   atorvastatin (LIPITOR) 80 MG tablet   Continuous Blood Gluc Receiver (FREESTYLE LIBRE 2 READER) DEVI   Continuous Blood Gluc Sensor (FREESTYLE LIBRE 2 SENSOR) MISC   cyclobenzaprine (FLEXERIL) 5 MG tablet   dronedarone (MULTAQ) 400 MG tablet   ezetimibe (ZETIA) 10 MG tablet   hydrochlorothiazide (HYDRODIURIL) 25 MG tablet   HYDROcodone-acetaminophen (NORCO) 10-325 MG tablet   lactulose, encephalopathy, (ENULOSE) 10 GM/15ML SOLN   linaclotide (LINZESS) 145 MCG CAPS capsule   losartan (COZAAR) 100 MG tablet   metFORMIN (GLUCOPHAGE-XR) 500 MG  24 hr tablet   Pirfenidone 801 MG TABS   No current facility-administered medications for this encounter.    Jodell Cipro Ward, PA-C [facility] [phone]

## 2022-08-06 NOTE — Telephone Encounter (Signed)
Called pt to inform her that Dr. Estill Dooms has switch her medication Xarelto to Eliquis 5 mg tablets and that we were leaving her samples of Eliquis 5 mg tablets and a patient assistance application at [employer] office front desk for pt to pick up. I advised pt that if she has any other problems, questions or concerns, to give our office a call back. Pt verbalized understanding.   Lot# [id number] Exp: 11/2023  FYI

## 2022-08-13 ENCOUNTER — Observation Stay (HOSPITAL_COMMUNITY)
Admission: RE | Admit: 2022-08-13 | Discharge: 2022-08-14 | Disposition: A | Discharge status: Home / Self Care | Payer: PPO | Attending: Orthopedic Surgery | Admitting: Orthopedic Surgery

## 2022-08-13 ENCOUNTER — Ambulatory Visit (HOSPITAL_BASED_OUTPATIENT_CLINIC_OR_DEPARTMENT_OTHER): Payer: PPO | Admitting: Anesthesiology

## 2022-08-13 ENCOUNTER — Ambulatory Visit (HOSPITAL_COMMUNITY): Payer: PPO | Admitting: Physician Assistant

## 2022-08-13 ENCOUNTER — Encounter (HOSPITAL_COMMUNITY)
Admission: RE | Disposition: A | Discharge status: Home / Self Care | Payer: Self-pay | Source: Home / Self Care | Attending: Orthopedic Surgery

## 2022-08-13 ENCOUNTER — Encounter (HOSPITAL_COMMUNITY): Payer: Self-pay | Admitting: Orthopedic Surgery

## 2022-08-13 ENCOUNTER — Other Ambulatory Visit: Payer: Self-pay

## 2022-08-13 DIAGNOSIS — I4891 Unspecified atrial fibrillation: Secondary | ICD-10-CM | POA: Diagnosis not present

## 2022-08-13 DIAGNOSIS — I480 Paroxysmal atrial fibrillation: Secondary | ICD-10-CM | POA: Diagnosis not present

## 2022-08-13 DIAGNOSIS — I509 Heart failure, unspecified: Secondary | ICD-10-CM | POA: Diagnosis not present

## 2022-08-13 DIAGNOSIS — E785 Hyperlipidemia, unspecified: Secondary | ICD-10-CM

## 2022-08-13 DIAGNOSIS — E1122 Type 2 diabetes mellitus with diabetic chronic kidney disease: Secondary | ICD-10-CM | POA: Insufficient documentation

## 2022-08-13 DIAGNOSIS — Z96612 Presence of left artificial shoulder joint: Secondary | ICD-10-CM

## 2022-08-13 DIAGNOSIS — I2510 Atherosclerotic heart disease of native coronary artery without angina pectoris: Secondary | ICD-10-CM

## 2022-08-13 DIAGNOSIS — J45909 Unspecified asthma, uncomplicated: Secondary | ICD-10-CM | POA: Insufficient documentation

## 2022-08-13 DIAGNOSIS — I10 Essential (primary) hypertension: Secondary | ICD-10-CM

## 2022-08-13 DIAGNOSIS — G4730 Sleep apnea, unspecified: Secondary | ICD-10-CM | POA: Diagnosis not present

## 2022-08-13 DIAGNOSIS — Z79899 Other long term (current) drug therapy: Secondary | ICD-10-CM | POA: Insufficient documentation

## 2022-08-13 DIAGNOSIS — N189 Chronic kidney disease, unspecified: Secondary | ICD-10-CM | POA: Diagnosis not present

## 2022-08-13 DIAGNOSIS — I130 Hypertensive heart and chronic kidney disease with heart failure and stage 1 through stage 4 chronic kidney disease, or unspecified chronic kidney disease: Secondary | ICD-10-CM | POA: Insufficient documentation

## 2022-08-13 DIAGNOSIS — E119 Type 2 diabetes mellitus without complications: Secondary | ICD-10-CM

## 2022-08-13 DIAGNOSIS — M19012 Primary osteoarthritis, left shoulder: Secondary | ICD-10-CM | POA: Diagnosis not present

## 2022-08-13 DIAGNOSIS — I110 Hypertensive heart disease with heart failure: Secondary | ICD-10-CM | POA: Diagnosis not present

## 2022-08-13 DIAGNOSIS — M12812 Other specific arthropathies, not elsewhere classified, left shoulder: Secondary | ICD-10-CM

## 2022-08-13 DIAGNOSIS — R079 Chest pain, unspecified: Secondary | ICD-10-CM | POA: Diagnosis not present

## 2022-08-13 DIAGNOSIS — Z7901 Long term (current) use of anticoagulants: Secondary | ICD-10-CM | POA: Insufficient documentation

## 2022-08-13 DIAGNOSIS — G4733 Obstructive sleep apnea (adult) (pediatric): Secondary | ICD-10-CM | POA: Diagnosis not present

## 2022-08-13 DIAGNOSIS — M75102 Unspecified rotator cuff tear or rupture of left shoulder, not specified as traumatic: Secondary | ICD-10-CM | POA: Diagnosis not present

## 2022-08-13 DIAGNOSIS — G8918 Other acute postprocedural pain: Secondary | ICD-10-CM | POA: Diagnosis not present

## 2022-08-13 HISTORY — PX: REVERSE SHOULDER ARTHROPLASTY: SHX5054

## 2022-08-13 LAB — TROPONIN I (HIGH SENSITIVITY)
Troponin I (High Sensitivity): 16 ng/L (ref <18)
Troponin I (High Sensitivity): 14 ng/L (ref <18)
Troponin I (High Sensitivity): 13 ng/L (ref <18)

## 2022-08-13 LAB — GLUCOSE, CAPILLARY
Glucose-Capillary: 103 mg/dL — ABNORMAL HIGH (ref 70–99)
Glucose-Capillary: 106 mg/dL — ABNORMAL HIGH (ref 70–99)
Glucose-Capillary: 237 mg/dL — ABNORMAL HIGH (ref 70–99)

## 2022-08-13 SURGERY — ARTHROPLASTY, SHOULDER, TOTAL, REVERSE
Anesthesia: General | Site: Shoulder | Laterality: Left

## 2022-08-13 MED ORDER — APIXABAN 5 MG PO TABS
5.0000 mg | ORAL_TABLET | Freq: Two times a day (BID) | ORAL | Status: DC
  Administered 2022-08-13 – 2022-08-14 (×2): 5 mg via ORAL
  Filled 2022-08-13 (×2): qty 1

## 2022-08-13 MED ORDER — METOPROLOL TARTRATE 12.5 MG HALF TABLET
12.5000 mg | ORAL_TABLET | Freq: Four times a day (QID) | ORAL | Status: DC
  Administered 2022-08-13 (×2): 12.5 mg via ORAL
  Filled 2022-08-13 (×2): qty 1

## 2022-08-13 MED ORDER — 0.9 % SODIUM CHLORIDE (POUR BTL) OPTIME
TOPICAL | Status: DC | PRN
  Administered 2022-08-13: 1000 mL

## 2022-08-13 MED ORDER — FENTANYL CITRATE PF 50 MCG/ML IJ SOSY: 50.0000 ug | PREFILLED_SYRINGE | INTRAMUSCULAR | Status: DC

## 2022-08-13 MED ORDER — ORAL CARE MOUTH RINSE: 15.0000 mL | OROMUCOSAL | Status: DC | PRN

## 2022-08-13 MED ORDER — METOCLOPRAMIDE HCL 5 MG PO TABS: 5.0000 mg | ORAL_TABLET | Freq: Three times a day (TID) | ORAL | Status: DC | PRN

## 2022-08-13 MED ORDER — ACETAMINOPHEN 325 MG PO TABS: 325.0000 mg | ORAL_TABLET | Freq: Four times a day (QID) | ORAL | Status: DC | PRN

## 2022-08-13 MED ORDER — CEFAZOLIN SODIUM-DEXTROSE 2-4 GM/100ML-% IV SOLN
2.0000 g | INTRAVENOUS | Status: AC
  Administered 2022-08-13: 2 g via INTRAVENOUS
  Filled 2022-08-13: qty 100

## 2022-08-13 MED ORDER — LACTATED RINGERS IV SOLN: INTRAVENOUS | Status: DC

## 2022-08-13 MED ORDER — METOCLOPRAMIDE HCL 5 MG/ML IJ SOLN: 5.0000 mg | Freq: Three times a day (TID) | INTRAMUSCULAR | Status: DC | PRN

## 2022-08-13 MED ORDER — LACTATED RINGERS IV SOLN
INTRAVENOUS | Status: DC
Start: 2022-08-13 — End: 2022-08-13

## 2022-08-13 MED ORDER — TRANEXAMIC ACID-NACL 1000-0.7 MG/100ML-% IV SOLN
1000.0000 mg | INTRAVENOUS | Status: AC
  Administered 2022-08-13: 1000 mg via INTRAVENOUS
  Filled 2022-08-13: qty 100

## 2022-08-13 MED ORDER — PROPOFOL 10 MG/ML IV BOLUS
INTRAVENOUS | Status: DC | PRN
  Administered 2022-08-13: 110 mg via INTRAVENOUS

## 2022-08-13 MED ORDER — ALPRAZOLAM 0.25 MG PO TABS: 0.2500 mg | ORAL_TABLET | Freq: Three times a day (TID) | ORAL | Status: DC | PRN

## 2022-08-13 MED ORDER — SUGAMMADEX SODIUM 500 MG/5ML IV SOLN
INTRAVENOUS | Status: DC | PRN
  Administered 2022-08-13: 200 mg via INTRAVENOUS

## 2022-08-13 MED ORDER — DRONEDARONE HCL 400 MG PO TABS
400.0000 mg | ORAL_TABLET | Freq: Two times a day (BID) | ORAL | Status: DC
  Administered 2022-08-13 – 2022-08-14 (×2): 400 mg via ORAL
  Filled 2022-08-13 (×3): qty 1

## 2022-08-13 MED ORDER — DEXAMETHASONE SODIUM PHOSPHATE 10 MG/ML IJ SOLN
INTRAMUSCULAR | Status: AC
  Filled 2022-08-13: qty 1

## 2022-08-13 MED ORDER — ATORVASTATIN CALCIUM 40 MG PO TABS
80.0000 mg | ORAL_TABLET | Freq: Every evening | ORAL | Status: DC
  Administered 2022-08-13: 80 mg via ORAL
  Filled 2022-08-13: qty 1
  Filled 2022-08-13: qty 2
  Filled 2022-08-13: qty 1

## 2022-08-13 MED ORDER — CHLORHEXIDINE GLUCONATE 0.12 % MT SOLN: 15.0000 mL | Freq: Once | OROMUCOSAL | Status: DC

## 2022-08-13 MED ORDER — ONDANSETRON HCL 4 MG/2ML IJ SOLN: 4.0000 mg | Freq: Four times a day (QID) | INTRAMUSCULAR | Status: DC | PRN

## 2022-08-13 MED ORDER — ACETAMINOPHEN 325 MG PO TABS: 650.0000 mg | ORAL_TABLET | Freq: Four times a day (QID) | ORAL | Status: DC | PRN

## 2022-08-13 MED ORDER — LIDOCAINE HCL (PF) 2 % IJ SOLN
INTRAMUSCULAR | Status: AC
  Filled 2022-08-13: qty 5

## 2022-08-13 MED ORDER — DEXAMETHASONE SODIUM PHOSPHATE 10 MG/ML IJ SOLN
INTRAMUSCULAR | Status: DC | PRN
  Administered 2022-08-13: 8 mg via INTRAVENOUS

## 2022-08-13 MED ORDER — PHENYLEPHRINE HCL-NACL 20-0.9 MG/250ML-% IV SOLN
INTRAVENOUS | Status: DC | PRN
  Administered 2022-08-13: 20 ug/min via INTRAVENOUS

## 2022-08-13 MED ORDER — FENTANYL CITRATE PF 50 MCG/ML IJ SOSY
PREFILLED_SYRINGE | INTRAMUSCULAR | Status: AC
  Filled 2022-08-13: qty 2

## 2022-08-13 MED ORDER — ACETAMINOPHEN 10 MG/ML IV SOLN
INTRAVENOUS | Status: DC | PRN
  Administered 2022-08-13: 1000 mg via INTRAVENOUS

## 2022-08-13 MED ORDER — ONDANSETRON HCL 4 MG PO TABS: 4.0000 mg | ORAL_TABLET | Freq: Four times a day (QID) | ORAL | Status: DC | PRN

## 2022-08-13 MED ORDER — OXYCODONE-ACETAMINOPHEN 5-325 MG PO TABS: 1.0000 | ORAL_TABLET | ORAL | 0 refills | Status: DC | PRN

## 2022-08-13 MED ORDER — ORAL CARE MOUTH RINSE: 15.0000 mL | Freq: Once | OROMUCOSAL | Status: DC

## 2022-08-13 MED ORDER — VANCOMYCIN HCL 1000 MG IV SOLR
INTRAVENOUS | Status: DC | PRN
  Administered 2022-08-13: 1000 mg via TOPICAL

## 2022-08-13 MED ORDER — LIDOCAINE HCL (CARDIAC) PF 100 MG/5ML IV SOSY
PREFILLED_SYRINGE | INTRAVENOUS | Status: DC | PRN
  Administered 2022-08-13: 80 mg via INTRAVENOUS

## 2022-08-13 MED ORDER — EZETIMIBE 10 MG PO TABS
10.0000 mg | ORAL_TABLET | Freq: Every day | ORAL | Status: DC
  Administered 2022-08-14: 10 mg via ORAL
  Filled 2022-08-13: qty 1

## 2022-08-13 MED ORDER — ALBUTEROL SULFATE (2.5 MG/3ML) 0.083% IN NEBU: 3.0000 mL | INHALATION_SOLUTION | Freq: Four times a day (QID) | RESPIRATORY_TRACT | Status: DC | PRN

## 2022-08-13 MED ORDER — PROPOFOL 10 MG/ML IV BOLUS
INTRAVENOUS | Status: AC
  Filled 2022-08-13: qty 20

## 2022-08-13 MED ORDER — AMLODIPINE BESYLATE 5 MG PO TABS
5.0000 mg | ORAL_TABLET | Freq: Every day | ORAL | Status: DC
  Filled 2022-08-13: qty 1

## 2022-08-13 MED ORDER — LOSARTAN POTASSIUM 50 MG PO TABS
100.0000 mg | ORAL_TABLET | Freq: Every evening | ORAL | Status: DC
  Administered 2022-08-13: 100 mg via ORAL
  Filled 2022-08-13: qty 2

## 2022-08-13 MED ORDER — APIXABAN 5 MG PO TABS: 5.0000 mg | ORAL_TABLET | Freq: Two times a day (BID) | ORAL | Status: DC

## 2022-08-13 MED ORDER — METOPROLOL TARTRATE 5 MG/5ML IV SOLN: 5.0000 mg | INTRAVENOUS | Status: DC | PRN

## 2022-08-13 MED ORDER — BUPIVACAINE LIPOSOME 1.3 % IJ SUSP
INTRAMUSCULAR | Status: DC | PRN
  Administered 2022-08-13: 10 mL via PERINEURAL

## 2022-08-13 MED ORDER — ROCURONIUM BROMIDE 100 MG/10ML IV SOLN
INTRAVENOUS | Status: DC | PRN
  Administered 2022-08-13: 60 mg via INTRAVENOUS

## 2022-08-13 MED ORDER — FENTANYL CITRATE (PF) 100 MCG/2ML IJ SOLN
INTRAMUSCULAR | Status: DC | PRN
  Administered 2022-08-13 (×2): 50 ug via INTRAVENOUS

## 2022-08-13 MED ORDER — ONDANSETRON HCL 4 MG PO TABS: 4.0000 mg | ORAL_TABLET | Freq: Three times a day (TID) | ORAL | 0 refills | Status: DC | PRN

## 2022-08-13 MED ORDER — FENTANYL CITRATE (PF) 100 MCG/2ML IJ SOLN
INTRAMUSCULAR | Status: AC
  Filled 2022-08-13: qty 2

## 2022-08-13 MED ORDER — DIPHENHYDRAMINE HCL 50 MG/ML IJ SOLN
INTRAMUSCULAR | Status: DC | PRN
  Administered 2022-08-13: 12.5 mg via INTRAVENOUS

## 2022-08-13 MED ORDER — HYDROCHLOROTHIAZIDE 25 MG PO TABS
25.0000 mg | ORAL_TABLET | Freq: Every day | ORAL | Status: DC
  Administered 2022-08-13: 25 mg via ORAL
  Filled 2022-08-13 (×2): qty 1

## 2022-08-13 MED ORDER — CYCLOBENZAPRINE HCL 10 MG PO TABS: 10.0000 mg | ORAL_TABLET | Freq: Three times a day (TID) | ORAL | 1 refills | Status: DC | PRN

## 2022-08-13 MED ORDER — METHOCARBAMOL 500 MG PO TABS
500.0000 mg | ORAL_TABLET | Freq: Four times a day (QID) | ORAL | Status: DC | PRN
  Administered 2022-08-14: 500 mg via ORAL
  Filled 2022-08-13: qty 1

## 2022-08-13 MED ORDER — PIRFENIDONE 801 MG PO TABS: 1.0000 | ORAL_TABLET | Freq: Three times a day (TID) | ORAL | Status: DC

## 2022-08-13 MED ORDER — OXYCODONE HCL 5 MG PO TABS
5.0000 mg | ORAL_TABLET | ORAL | Status: DC | PRN
  Administered 2022-08-14: 5 mg via ORAL
  Administered 2022-08-14: 10 mg via ORAL
  Administered 2022-08-14: 5 mg via ORAL
  Filled 2022-08-13: qty 1
  Filled 2022-08-13: qty 2
  Filled 2022-08-13: qty 1

## 2022-08-13 MED ORDER — ONDANSETRON HCL 4 MG/2ML IJ SOLN
INTRAMUSCULAR | Status: DC | PRN
  Administered 2022-08-13: 4 mg via INTRAVENOUS

## 2022-08-13 MED ORDER — ROCURONIUM BROMIDE 10 MG/ML (PF) SYRINGE
PREFILLED_SYRINGE | INTRAVENOUS | Status: AC
  Filled 2022-08-13: qty 10

## 2022-08-13 MED ORDER — METHOCARBAMOL 500 MG IVPB - SIMPLE MED: 500.0000 mg | Freq: Four times a day (QID) | INTRAVENOUS | Status: DC | PRN

## 2022-08-13 MED ORDER — ACETAMINOPHEN 10 MG/ML IV SOLN
INTRAVENOUS | Status: AC
  Filled 2022-08-13: qty 100

## 2022-08-13 MED ORDER — PHENYLEPHRINE 80 MCG/ML (10ML) SYRINGE FOR IV PUSH (FOR BLOOD PRESSURE SUPPORT)
PREFILLED_SYRINGE | INTRAVENOUS | Status: AC
  Filled 2022-08-13: qty 10

## 2022-08-13 MED ORDER — BUPIVACAINE HCL (PF) 0.5 % IJ SOLN
INTRAMUSCULAR | Status: DC | PRN
  Administered 2022-08-13: 10 mL via PERINEURAL

## 2022-08-13 MED ORDER — ONDANSETRON HCL 4 MG/2ML IJ SOLN
INTRAMUSCULAR | Status: AC
  Filled 2022-08-13: qty 2

## 2022-08-13 MED ORDER — HYDROMORPHONE HCL 1 MG/ML IJ SOLN: 0.5000 mg | INTRAMUSCULAR | Status: DC | PRN

## 2022-08-13 MED ORDER — VANCOMYCIN HCL 1000 MG IV SOLR
INTRAVENOUS | Status: AC
  Filled 2022-08-13: qty 20

## 2022-08-13 MED ORDER — DIPHENHYDRAMINE HCL 50 MG/ML IJ SOLN
INTRAMUSCULAR | Status: AC
  Filled 2022-08-13: qty 1

## 2022-08-13 MED ORDER — OXYCODONE HCL 5 MG PO TABS: 10.0000 mg | ORAL_TABLET | ORAL | Status: DC | PRN

## 2022-08-13 SURGICAL SUPPLY — 69 items
ADH SKN CLS APL DERMABOND .7 (GAUZE/BANDAGES/DRESSINGS) ×1
AID PSTN UNV HD RSTRNT DISP (MISCELLANEOUS) ×1
BAG COUNTER SPONGE SURGICOUNT (BAG) IMPLANT
BAG SPEC THK2 15X12 ZIP CLS (MISCELLANEOUS) ×1
BAG SPNG CNTER NS LX DISP (BAG) ×1
BAG ZIPLOCK 12X15 (MISCELLANEOUS) ×1 IMPLANT
BIT DRILL AR 3 (BIT) ×1
BIT DRILL AR 3 NS (BIT) IMPLANT
BLADE SAW SGTL 83.5X18.5 (BLADE) ×1 IMPLANT
BNDG CMPR 5X4 CHSV STRCH STRL (GAUZE/BANDAGES/DRESSINGS) ×1
BNDG COHESIVE 4X5 TAN STRL LF (GAUZE/BANDAGES/DRESSINGS) ×1 IMPLANT
BSPLAT GLND +2X24 MDLR (Joint) ×1 IMPLANT
COOLER ICEMAN CLASSIC (MISCELLANEOUS) ×1 IMPLANT
COVER BACK TABLE 60X90IN (DRAPES) ×1 IMPLANT
COVER SURGICAL LIGHT HANDLE (MISCELLANEOUS) ×1 IMPLANT
CUP SUT UNIV REVERS 36 NEUTRAL (Cup) IMPLANT
DERMABOND ADVANCED .7 DNX12 (GAUZE/BANDAGES/DRESSINGS) ×1 IMPLANT
DRAPE ORTHO SPLIT 77X108 STRL (DRAPES) ×2
DRAPE SHEET LG 3/4 BI-LAMINATE (DRAPES) ×1 IMPLANT
DRAPE SURG 17X11 SM STRL (DRAPES) ×1 IMPLANT
DRAPE SURG ORHT 6 SPLT 77X108 (DRAPES) ×2 IMPLANT
DRAPE TOP 10253 STERILE (DRAPES) ×1 IMPLANT
DRAPE U-SHAPE 47X51 STRL (DRAPES) ×1 IMPLANT
DRESSING AQUACEL AG SP 3.5X6 (GAUZE/BANDAGES/DRESSINGS) ×1 IMPLANT
DRSG AQUACEL AG ADV 3.5X10 (GAUZE/BANDAGES/DRESSINGS) IMPLANT
DRSG AQUACEL AG SP 3.5X6 (GAUZE/BANDAGES/DRESSINGS) ×1
DURAPREP 26ML APPLICATOR (WOUND CARE) ×1 IMPLANT
ELECT BLADE TIP CTD 4 INCH (ELECTRODE) ×1 IMPLANT
ELECT PENCIL ROCKER SW 15FT (MISCELLANEOUS) ×1 IMPLANT
ELECT REM PT RETURN 15FT ADLT (MISCELLANEOUS) ×1 IMPLANT
FACESHIELD WRAPAROUND (MASK) ×5 IMPLANT
FACESHIELD WRAPAROUND OR TEAM (MASK) ×5 IMPLANT
GLENOID UNI REV MOD 24 +2 LAT (Joint) IMPLANT
GLENOSPHERE 36 +4 LAT/24 (Joint) IMPLANT
GLOVE BIO SURGEON STRL SZ7.5 (GLOVE) ×1 IMPLANT
GLOVE BIO SURGEON STRL SZ8 (GLOVE) ×1 IMPLANT
GLOVE SS BIOGEL STRL SZ 7 (GLOVE) ×1 IMPLANT
GLOVE SS BIOGEL STRL SZ 7.5 (GLOVE) ×1 IMPLANT
GOWN STRL SURGICAL XL XLNG (GOWN DISPOSABLE) ×2 IMPLANT
KIT BASIN OR (CUSTOM PROCEDURE TRAY) ×1 IMPLANT
KIT TURNOVER KIT A (KITS) IMPLANT
LINER HUMERAL 36 +3MM SM (Shoulder) IMPLANT
MANIFOLD NEPTUNE II (INSTRUMENTS) ×1 IMPLANT
NDL TAPERED W/ NITINOL LOOP (MISCELLANEOUS) ×1 IMPLANT
NEEDLE TAPERED W/ NITINOL LOOP (MISCELLANEOUS) ×1 IMPLANT
NS IRRIG 1000ML POUR BTL (IV SOLUTION) ×1 IMPLANT
PACK SHOULDER (CUSTOM PROCEDURE TRAY) ×1 IMPLANT
PAD ARMBOARD 7.5X6 YLW CONV (MISCELLANEOUS) ×1 IMPLANT
PAD COLD SHLDR WRAP-ON (PAD) ×1 IMPLANT
PIN NITINOL TARGETER 2.8 (PIN) IMPLANT
PIN SET MODULAR GLENOID SYSTEM (PIN) IMPLANT
RESTRAINT HEAD UNIVERSAL NS (MISCELLANEOUS) ×1 IMPLANT
SCREW CENTRAL MOD 30MM (Screw) IMPLANT
SCREW PERI LOCK 5.5X24 (Screw) IMPLANT
SCREW PERI LOCK 5.5X36 (Screw) IMPLANT
SCREW PERIPHERAL 5.5X20 LOCK (Screw) IMPLANT
SLING ARM FOAM STRAP LRG (SOFTGOODS) IMPLANT
SLING ARM FOAM STRAP MED (SOFTGOODS) IMPLANT
STEM HUMERAL UNI REVERSE SZ10 (Stem) IMPLANT
SUT MNCRL AB 3-0 PS2 18 (SUTURE) ×1 IMPLANT
SUT MON AB 2-0 CT1 36 (SUTURE) ×1 IMPLANT
SUT VIC AB 1 CT1 36 (SUTURE) ×1 IMPLANT
SUTURE TAPE 1.3 40 TPR END (SUTURE) ×2 IMPLANT
SUTURETAPE 1.3 40 TPR END (SUTURE) ×2
TOWEL OR 17X26 10 PK STRL BLUE (TOWEL DISPOSABLE) ×1 IMPLANT
TOWEL OR NON WOVEN STRL DISP B (DISPOSABLE) ×1 IMPLANT
TUBE SUCTION HIGH CAP CLEAR NV (SUCTIONS) ×1 IMPLANT
TUBING CONNECTING 10 (TUBING) IMPLANT
WATER STERILE IRR 1000ML POUR (IV SOLUTION) ×2 IMPLANT

## 2022-08-13 NOTE — OR Nursing (Signed)
[place] MD and Shuford PA at bedside. Patient is asymptomatic for Rapid Afib 120s in PACU. EKG was taken as ordered and confirmed arrhythmia.  Patient to continue to Phase II and to discharge home per providers at bedside and to resume home anticoagulant and antiarrhythmia medications at home.

## 2022-08-13 NOTE — Consult Note (Signed)
Cardiology Consultation   Patient ID: Miranda [name] MRN: [id number]; DOB: 1945/04/14  Admit date: 08/13/2022 Date of Consult: 08/13/2022  PCP:  Philip Aspen, Limmie Patricia, MD   [facility] Providers Cardiologist:  Peter Swaziland, MD  Electrophysiologist:  Regan Lemming, MD       Patient Profile:   Miranda [name] is a 77 y.o. female with a hx of hypertension, hyperlipidemia, OSA on CPAP, DM 2, and atrial fibrillation 08/13/2022 for the evaluation of atrial fibrillation at the request of Dr. Rennis Chris.  History of Present Illness:   Miranda [name] is s/p revere shoulder arthroplasty of the L shoulder today. She then developed atrial fibrillation rates <120 predominantly. She is otherwise HDS. Notes some chest pressure post-op.  She is a patient of Dr. Swaziland. Per his note 10/2021 " Myoview in March 2016 showed EF 69%, normal perfusion and wall motion.  She was diagnosed with acute PE in February 2018 and was treated with 71-month course of Xarelto.  High-resolution CT in February 2018 revealed interstitial lung disease as well as aortic atherosclerosis and two-vessel CAD.  Echocardiogram on 05/02/2016 showed EF 60 to 65%, grade 1 DD, moderate LAE.  Repeat CTA of chest in July 2019 showed resolution of PE.  She had atrial fibrillation while getting ready for colonoscopy in July 2019.  She spontaneously converted in the emergency room.  She was restarted on Xarelto and metoprolol.  EF was normal on echocardiogram.  Myoview was normal.  She was hospitalized in January 2023 with chest pain shortness of breath and found to have acute renal failure and felt to be dehydrated.  She was given IV fluid.  Echocardiogram was normal.  She also had hypokalemia and hypocalcemia.  She is currently on Multaq to 400 mg twice a day to help control PAF."   Past Medical History:  Diagnosis Date   Anemia    Anxiety    Arthritis    Asthma    Atrial fibrillation (HCC)    CHF (congestive heart failure)  (HCC)    pt. unsure- but thinks she was hosp. for CHF- 2002   [facility] kidney disease    [facility]   Clotting disorder (HCC)    blood clots in lungs/PE pulmonary embolism   Colon polyps    Complication of anesthesia    states requires a lot med. to put her to sleep    DDD (degenerative disc disease) 09/17/2011   Depression    "sometimes "   DM (diabetes mellitus) (HCC)    Family history of anesthesia complication    Sister had difficulty waking up from anesthesia   GERD (gastroesophageal reflux disease)    Glaucoma    bilateral, pt. admits that she is noncompliant to eye gtts.    Hemorrhoids    Hyperlipidemia    Hypertension    had stress, echo- 2006 /w [place], Cardiac Cath, per pt. 2002, echo repeated 2012- wnl    IPF (idiopathic pulmonary fibrosis) (HCC)    Low back pain    Shortness of breath    Sleep apnea    uses c-pap- q night recently    Past Surgical History:  Procedure Laterality Date   ABDOMINAL HYSTERECTOMY     ectopic, fibroids   ANTERIOR CERVICAL DECOMP/DISCECTOMY FUSION N/A 09/11/2020   Procedure: Anterior Cervical Decompression Fusion Cervical four-five, Cervical five-six;  Surgeon: Bedelia Person, MD;  Location: [facility] OR;  Service: Neurosurgery;  Laterality: N/A;   arm surgery Right  CARDIAC CATHETERIZATION     CATARACT EXTRACTION, BILATERAL     cataracts removed bilateral- ?IOL   COLONOSCOPY     remote   FISSURECTOMY  10/08/2011   Procedure: FISSURECTOMY;  Surgeon: Almond Lint, MD;  Location[facility];  Service: General;  Laterality: N/A;   FLEXIBLE SIGMOIDOSCOPY  02/25/2011   Procedure: FLEXIBLE SIGMOIDOSCOPY;  Surgeon: Louis Meckel, MD;  [facility];  Service: Endoscopy;  Laterality: N/A;   FOOT SURGERY     bilat, heel spurs- screw in R foot    HEMORRHOID SURGERY  10/08/2011   Procedure: HEMORRHOIDECTOMY;  Surgeon: Almond Lint, MD;  Location[facility];  Service: General;  Laterality: N/A;  External    ROTATOR CUFF REPAIR  Right    SPHINCTEROTOMY  10/08/2011   Procedure: SPHINCTEROTOMY;  Surgeon: Almond Lint, MD;  Location[facility];  Service: General;  Laterality: N/A;     Home Medications:  Prior to Admission medications   Medication Sig Start Date End Date Taking? Authorizing Provider  acetaminophen (TYLENOL) 325 MG tablet Take 650 mg by mouth every 6 (six) hours as needed for moderate pain.   Yes [provider]  albuterol (VENTOLIN HFA) 108 (90 Base) MCG/ACT inhaler Inhale 1 puff into the lungs every 6 (six) hours as needed. 06/23/19  Yes Philip Aspen, Limmie Patricia, MD  ALPRAZolam Prudy Feeler) 0.25 MG tablet Take 1 tablet (0.25 mg total) by mouth 3 (three) times daily as needed. 06/25/22  Yes Philip Aspen, Limmie Patricia, MD  amLODipine (NORVASC) 5 MG tablet Take 1 tablet (5 mg total) by mouth daily. 07/20/22  Yes Philip Aspen, Limmie Patricia, MD  apixaban (ELIQUIS) 5 MG TABS tablet Take 1 tablet (5 mg total) by mouth 2 (two) times daily. 08/06/22   [name], Will Daphine Deutscher, MD  atorvastatin (LIPITOR) 80 MG tablet Take 1 tablet (80 mg total) by mouth every evening. 06/11/22  Yes Philip Aspen, Limmie Patricia, MD  cyclobenzaprine (FLEXERIL) 10 MG tablet Take 1 tablet (10 mg total) by mouth 3 (three) times daily as needed for muscle spasms. 08/13/22  Yes Shuford, French Ana, PA-C  dronedarone (MULTAQ) 400 MG tablet Take 1 tablet (400 mg total) by mouth 2 (two) times daily with a meal. 07/22/22  Yes [name], Will Daphine Deutscher, MD  ezetimibe (ZETIA) 10 MG tablet Take 1 tablet (10 mg total) by mouth daily. 06/11/22  Yes Philip Aspen, Limmie Patricia, MD  hydrochlorothiazide (HYDRODIURIL) 25 MG tablet Take 1 tablet (25 mg total) by mouth daily. 06/11/22  Yes Philip Aspen, Limmie Patricia, MD  lactulose, encephalopathy, (ENULOSE) 10 GM/15ML SOLN take BY MOUTH three times daily AS NEEDED FOR mild constipation 08/06/21  Yes Philip Aspen, Limmie Patricia, MD  losartan (COZAAR) 100 MG tablet Take 1 tablet (100 mg total) by mouth every evening. 06/25/22  Yes  Philip Aspen, Limmie Patricia, MD  ondansetron (ZOFRAN) 4 MG tablet Take 1 tablet (4 mg total) by mouth every 8 (eight) hours as needed for nausea or vomiting. 08/13/22  Yes Shuford, French Ana, PA-C  oxyCODONE-acetaminophen (PERCOCET) 5-325 MG tablet Take 1 tablet by mouth every 4 (four) hours as needed (max 6 q). 08/13/22  Yes Shuford, French Ana, PA-C  Pirfenidone 801 MG TABS Take 1 tablet (801 mg total) by mouth 3 (three) times daily. 05/29/22  Yes Mannam, Colbert Coyer, MD  linaclotide (LINZESS) 145 MCG CAPS capsule TAKE ONE CAPSULE BY MOUTH BEFORE BREAKFAST Patient not taking: Reported on 08/03/2022 04/03/22   Sherrilyn Rist, MD    Inpatient Medications: Scheduled Meds:  amLODipine  5 mg Oral Daily   apixaban  5 mg Oral BID   atorvastatin  80 mg Oral QPM   chlorhexidine  15 mL Mouth/Throat Once   Or   mouth rinse  15 mL Mouth Rinse Once   dronedarone  400 mg Oral BID WC   ezetimibe  10 mg Oral Daily   hydrochlorothiazide  25 mg Oral Daily   losartan  100 mg Oral QPM   Pirfenidone  1 tablet Oral TID   Continuous Infusions:  lactated ringers 10 mL/hr at 08/13/22 0831   PRN Meds: 0.9 % irrigation (POUR BTL), acetaminophen, albuterol, ALPRAZolam, metoCLOPramide **OR** metoCLOPramide (REGLAN) injection, ondansetron **OR** ondansetron (ZOFRAN) IV, vancomycin  Allergies:    Allergies  Allergen Reactions   Fish Oil Anaphylaxis   Other Hives, Shortness Of Breath and Swelling    Allergic to cashew nuts and peanut oil.   Penicillins Anaphylaxis, Hives and Swelling    Has patient had a PCN reaction causing immediate rash, facial/tongue/throat swelling, SOB or lightheadedness with hypotension: Face swelling and hives started first, then swelling of the throat  Has patient had a PCN reaction causing severe rash involving mucus membranes or skin necrosis: Yes  Has patient had a PCN reaction that required hospitalization: No  Has patient had a PCN reaction occurring within the last 10 years: Yes  If all of  the above answers are "NO", then may proceed with Cephalospor   Pneumococcal Vaccines Nausea And Vomiting   Cashew Nut Oil    Nitrofurantoin Macrocrystal Hives   Peanut Oil    Aspirin Itching and Rash   Bactrim [Sulfamethoxazole-Trimethoprim] Hives, Itching and Rash   Ciprofloxacin Hives, Itching and Rash   Ibuprofen Rash   Influenza Vaccines Hives   Ivp Dye [Iodinated Contrast Media] Hives, Itching and Rash    Gives benadryl to counteract symptoms   Latex Rash   Macrobid [Nitrofurantoin Monohydrate Macrocrystals] Hives   Shellfish Allergy Hives    Patient also allergic to seafood    Social History:   Social History   Socioeconomic History   Marital status: Married    Spouse name: Not on file   Number of children: 4   Years of education: Not on file   Highest education level: Not on file  Occupational History   Occupation: retired Scientist, research (medical): UNEMPLOYED  Tobacco Use   Smoking status: Never    Passive exposure: Never   Smokeless tobacco: Never  Vaping Use   Vaping Use: Never used  Substance and Sexual Activity   Alcohol use: No    Alcohol/week: 0.0 standard drinks of alcohol   Drug use: No   Sexual activity: Never    Comment: hyst  Other Topics Concern   Not on file  Social History Narrative   Not on file   Social Determinants of Health   Financial Resource Strain: Low Risk  (01/09/2022)   Overall Financial Resource Strain (CARDIA)    Difficulty of Paying Living Expenses: Not very hard  Food Insecurity: No Food Insecurity (06/05/2022)   Hunger Vital Sign    Worried About Running Out of Food in the Last Year: Never true    Ran Out of Food in the Last Year: Never true  Transportation Needs: No Transportation Needs (08/07/2019)   PRAPARE - Administrator, Civil Service (Medical): No    Lack of Transportation (Non-Medical): No  Physical Activity: Not on file  Stress: Not on file  Social Connections: Not on  file  Intimate Partner Violence: Not on  file    Family History:    Family History  Problem Relation Age of Onset   Heart attack Mother    Heart disease Mother    Breast cancer Mother    Emphysema Sister    Breast cancer Sister    Arthritis/Rheumatoid Sister    Asthma Sister    Lung cancer Sister    COPD Sister    Colon cancer Brother    Colon cancer Brother    Anesthesia problems Neg Hx    Esophageal cancer Neg Hx    Rectal cancer Neg Hx    Stomach cancer Neg Hx      ROS:  Please see the history of present illness.   All other ROS reviewed and negative.     Physical Exam/Data:   Vitals:   08/13/22 1310 08/13/22 1315 08/13/22 1330 08/13/22 1400  BP:   137/71 (!) 151/94  Pulse: (!) 136 (!) 120 (!) 120 (!) 116  Resp:   15 16  Temp:    97.8 F (36.6 C)  TempSrc:      SpO2:  94% 94% 93%  Weight:      Height:        Intake/Output Summary (Last 24 hours) at 08/13/2022 1449 Last data filed at 08/13/2022 0944 Gross per 24 hour  Intake 800 ml  Output 100 ml  Net 700 ml      08/13/2022    7:48 AM 08/05/2022    9:51 AM 05/21/2022    2:31 PM  Last 3 Weights  Weight (lbs) 178 lb 3.2 oz 178 lb 3.2 oz 183 lb 6.4 oz  Weight (kg) 80.831 kg 80.831 kg 83.19 kg     Body mass index is 29.65 kg/m.  General:  Well nourished, well developed, in no acute distress HEENT: normal Neck: no JVD Vascular: No carotid bruits; Distal pulses 2+ bilaterally Cardiac:  normal S1, S2; IRRR; no murmur  Lungs:  clear to auscultation bilaterally, no wheezing, rhonchi or rales  Abd: soft, nontender, no hepatomegaly  Ext: no edema Musculoskeletal: L arm immobilized on a sling Skin: warm and dry  Neuro:  CNs 2-12 intact, no focal abnormalities noted Psych:  Normal affect   EKG:  The EKG was personally reviewed and demonstrates:  Afib rate 111 bpm   Telemetry:  Telemetry was personally reviewed and demonstrates:  afib rates 80s-1 teens  Relevant CV Studies: TTE 04/14/2021 1. Left ventricular ejection fraction, by estimation, is  55 to 60%. The  left ventricle has normal function. The left ventricle has no regional  wall motion abnormalities. Left ventricular diastolic parameters are  consistent with Grade I diastolic  dysfunction (impaired relaxation).   2. Right ventricular systolic function is normal. The right ventricular  size is normal. There is normal pulmonary artery systolic pressure.   3. The mitral valve is normal in structure. No evidence of mitral valve  regurgitation. No evidence of mitral stenosis.   4. The aortic valve is normal in structure. Aortic valve regurgitation is  not visualized. Aortic valve sclerosis/calcification is present, without  any evidence of aortic stenosis.   5. The inferior vena cava is normal in size with greater than 50%  respiratory variability, suggesting right atrial pressure of 3 mmHg.   Holter 03/17/2018 Minimum HR: 34 BPM at 6:44:52 AM(2) Maximum HR: 91 BPM at 11:52:10 AM(2) Average HR: 59 BPM Rare PACs and PVCs Zero atrial fibrillation Sinus rhythm with occasional atrial and ventricular  ectopy Bradycardia with HR in the 30s between 6-7AM Sitting with arm pain and walking with fatigue associated with sinus rhythm  Myoview 11/12/2017 Nuclear stress EF: 66%. There was no ST segment deviation noted during stress. No T wave inversion was noted during stress. The study is normal. This is a low risk study. The left ventricular ejection fraction is hyperdynamic (>65%).  Laboratory Data:  High Sensitivity Troponin:  No results for input(s): "TROPONINIHS" in the last 720 hours.   ChemistryNo results for input(s): "NA", "K", "CL", "CO2", "GLUCOSE", "BUN", "CREATININE", "CALCIUM", "MG", "GFRNONAA", "GFRAA", "ANIONGAP" in the last 168 hours.  No results for input(s): "PROT", "ALBUMIN", "AST", "ALT", "ALKPHOS", "BILITOT" in the last 168 hours. Lipids No results for input(s): "CHOL", "TRIG", "HDL", "LABVLDL", "LDLCALC", "CHOLHDL" in the last 168 hours.  HematologyNo results  for input(s): "WBC", "RBC", "HGB", "HCT", "MCV", "MCH", "MCHC", "RDW", "PLT" in the last 168 hours. Thyroid No results for input(s): "TSH", "FREET4" in the last 168 hours.  BNPNo results for input(s): "BNP", "PROBNP" in the last 168 hours.  DDimer No results for input(s): "DDIMER" in the last 168 hours.   Radiology/Studies:  No results found.   Assessment and Plan:   Paroxysmal Atrial Fibrillation: she has known hx of afib on multaq. Seems to have maintained sinus rhythm, but notes occasional symptoms. Her rates are predominantly rate controlled.  She's going in and out of afib, may maintain sinus overnight - IV metop 5 mg q34min PRN for 3 doses - metop 12.5 mg q6H - can consolidate metop tomorrow - restart home multaq 400 mg BID - continue home eliquis 5 mg BID - no plans for TEE/DCCV with recent surgery/in an arm brace  Chest pressure: Low suspicion for ACS. EKG does not show ischemic changes. Will get troponin level. Suspect will have some elevation, will ensure no significant delta. Will get limited echo  HTN: restart home norvasc 5 mg daily, HCTz 25 mg daily, losartan 100 mg daily  HLD: on lipitor 80 mg daily and zetia. LDL <70 mg/dL  ILD: per primary team. On pirfenidone  DM2: A1c <7.   Will follow-up tomorrow. Recommend admission overnight   Risk Assessment/Risk Scores:       CHA2DS2-VASc Score = 6   This indicates a 9.7% annual risk of stroke. The patient's score is based upon: CHF History: 0 HTN History: 1 Diabetes History: 1 Stroke History: 0 Vascular Disease History: 1 Age Score: 2 Gender Score: 1      For questions or updates, please contact [facility] Please consult [email or web address] for contact info under    Signed, Maisie Fus, MD  08/13/2022 2:49 PM

## 2022-08-13 NOTE — Telephone Encounter (Signed)
Called pt and left message informing pt that her medication Multaq 400 mg tablets from patient assistance program [employer], 3 bottles of 60 tablets, are at [employer] front desk for pt to pick up. Lot: [id number]   Exp: 02/2025   [facility]

## 2022-08-13 NOTE — Anesthesia Procedure Notes (Signed)
Procedure Name: Intubation Date/Time: 08/13/2022 8:42 AM  Performed by: Shanon Payor, CRNAPre-anesthesia Checklist: Patient identified, Emergency Drugs available, Suction available, Patient being monitored and Timeout performed Patient Re-evaluated:Patient Re-evaluated prior to induction Oxygen Delivery Method: Circle system utilized Preoxygenation: Pre-oxygenation with 100% oxygen Induction Type: IV induction Ventilation: Mask ventilation without difficulty Laryngoscope Size: Mac and 3 Grade View: Grade I Tube type: Oral Tube size: 7.0 mm Number of attempts: 1 Airway Equipment and Method: Stylet Placement Confirmation: ETT inserted through vocal cords under direct vision, positive ETCO2, CO2 detector and breath sounds checked- equal and bilateral Secured at: 23 cm Tube secured with: Tape Dental Injury: Teeth and Oropharynx as per pre-operative assessment

## 2022-08-13 NOTE — Progress Notes (Signed)
OT Cancellation Note  Patient Details Name: Miranda [name] MRN: [id number] DOB: Aug 10, 1945   Cancelled Treatment:    Reason Eval/Treat Not Completed: Medical issues which prohibited therapy. Hold OT evaluation today. OT to check back tomorrow as appropriate.    Reuben Likes, OTR/L 08/13/2022, 4:40 PM

## 2022-08-13 NOTE — Progress Notes (Signed)
Miranda [name], while in the recovery room has reverted back into her known atrial fibrillation but has had some elevated heart rates ranging from 1 10-1 40.  She also has a known history of pulmonary fibrosis and had received both cardiology and pulmonary clearance preoperatively.  She also complained of some mild difficulty getting a deep breath but of note she did indeed have an interscalene block.  Anesthesia evaluated and have recommended cardiology to see patient today.  I have called the cardiology on-call, Dr. Wyline Mood is in the hospital and will see.  Will most likely admit her for overnight observation but will allow cardiology to evaluate and proceed accordingly.

## 2022-08-13 NOTE — Care Plan (Signed)
Ortho Bundle Case Management Note  Patient Details  Name: Miranda [name] MRN: [id number] Date of Birth: [birth year]                  L Rev TSA on 08/13/22.  DCP: Home with daughter.  DME: Ice machine provided by hospital.  PT: EO   DME Arranged:  N/A DME Agency:       Additional Comments: Please contact me with any questions of if this plan should need to change.    Despina Pole, Case Manager  [facility]  [phone] 08/13/2022, 1:49 PM

## 2022-08-13 NOTE — H&P (Signed)
Miranda [name]    Chief Complaint: Left shoulder rotator cuff tear arthropathy HPI: The patient is a 77 y.o. female with chronic and progressive increasing left shoulder pain related to severe rotator cuff tear arthropathy.  Due to her increasing function limitations and failure to respond to prolonged attempts at conservative management, she is brought to the operating this time for planned left shoulder reverse arthroplasty  Past Medical History:  Diagnosis Date   Anemia    Anxiety    Arthritis    Asthma    Atrial fibrillation (HCC)    CHF (congestive heart failure) (HCC)    pt. unsure- but thinks she was hosp. for CHF- 2002   [facility]   Clotting disorder (HCC)    blood clots in lungs/PE pulmonary embolism   Colon polyps    Complication of anesthesia    states requires a lot med. to put her to sleep    DDD (degenerative disc disease) 09/17/2011   Depression    "sometimes "   DM (diabetes mellitus) (HCC)    Family history of anesthesia complication    Sister had difficulty waking up from anesthesia   GERD (gastroesophageal reflux disease)    Glaucoma    bilateral, pt. admits that she is noncompliant to eye gtts.    Hemorrhoids    Hyperlipidemia    Hypertension    had stress, echo- 2006 /w [place], Cardiac Cath, per pt. 2002, echo repeated 2012- wnl    IPF (idiopathic pulmonary fibrosis) (HCC)    Low back pain    Shortness of breath    Sleep apnea    uses c-pap- q night recently      Past Surgical History:  Procedure Laterality Date   ABDOMINAL HYSTERECTOMY     ectopic, fibroids   ANTERIOR CERVICAL DECOMP/DISCECTOMY FUSION N/A 09/11/2020   Procedure: Anterior Cervical Decompression Fusion Cervical four-five, Cervical five-six;  Surgeon: Bedelia Person, MD;  Location: [facility] OR;  Service: Neurosurgery;  Laterality: N/A;   arm surgery Right    CARDIAC CATHETERIZATION     CATARACT EXTRACTION, BILATERAL     cataracts removed  bilateral- ?IOL   COLONOSCOPY     remote   FISSURECTOMY  10/08/2011   Procedure: FISSURECTOMY;  Surgeon: Almond Lint, MD;  Location: [facility] OR;  Service: General;  Laterality: N/A;   FLEXIBLE SIGMOIDOSCOPY  02/25/2011   Procedure: FLEXIBLE SIGMOIDOSCOPY;  Surgeon: Louis Meckel, MD;  Location: [facility] ENDOSCOPY;  Service: Endoscopy;  Laterality: N/A;   FOOT SURGERY     bilat, heel spurs- screw in R foot    HEMORRHOID SURGERY  10/08/2011   Procedure: HEMORRHOIDECTOMY;  Surgeon: Almond Lint, MD;  Location: [facility] OR;  Service: General;  Laterality: N/A;  External    ROTATOR CUFF REPAIR Right    SPHINCTEROTOMY  10/08/2011   Procedure: SPHINCTEROTOMY;  Surgeon: Almond Lint, MD;  Location: [facility] OR;  Service: General;  Laterality: N/A;    Family History  Problem Relation Age of Onset   Heart attack Mother    Heart disease Mother    Breast cancer Mother    Emphysema Sister    Breast cancer Sister    Arthritis/Rheumatoid Sister    Asthma Sister    Lung cancer Sister    COPD Sister    Colon cancer Brother    Colon cancer Brother    Anesthesia problems Neg Hx    Esophageal cancer Neg Hx    Rectal cancer Neg  Hx    Stomach cancer Neg Hx     Social History:  reports that she has never smoked. She has never been exposed to tobacco smoke. She has never used smokeless tobacco. She reports that she does not drink alcohol and does not use drugs.  BMI: Estimated body mass index is 29.65 kg/m as calculated from the following:   Height as of 08/05/22: 5\' 5"  (1.651 m).   Weight as of 08/05/22: 80.8 kg.  Lab Results  Component Value Date   ALBUMIN 4.3 02/19/2022   Diabetes:   Patient has a diagnosis of diabetes,  Lab Results  Component Value Date   HGBA1C 6.0 (H) 08/05/2022   Smoking Status:   reports that she has never smoked. She has never been exposed to tobacco smoke. She has never used smokeless tobacco.     No medications prior to admission.     Physical Exam: Left shoulder  demonstrates painful and guarded motion as noted at recent office visits.  She demonstrates globally decreased strength to manual muscle testing.  She is otherwise neurovascular intact in left upper extremity.  Imaging studies confirmed changes consistent with chronic severe left shoulder rotator cuff tear arthropathy.  Vitals     Assessment/Plan  Impression: Left shoulder rotator cuff tear arthropathy  Plan of Action: Procedure(s): REVERSE SHOULDER ARTHROPLASTY Per preop medical evaluation she has held her anticoagulation and will anticipate having her resume postop day 1.  We will also reinforce the need for pulmonary toilet with incentive spirometry.  She will also continue the use of her CPAP for OSA. Kyrin Garn M Maanasa Aderhold 08/13/2022, 6:13 AM Contact # [phone]

## 2022-08-13 NOTE — Discharge Instructions (Signed)

## 2022-08-13 NOTE — Anesthesia Postprocedure Evaluation (Signed)
Anesthesia Post Note  Patient: Miranda [name]  Procedure(s) Performed: REVERSE SHOULDER ARTHROPLASTY (Left: Shoulder)     Patient location during evaluation: PACU Anesthesia Type: General Level of consciousness: sedated and patient cooperative Pain management: pain level controlled Vital Signs Assessment: post-procedure vital signs reviewed and stable Respiratory status: spontaneous breathing Cardiovascular status: stable Anesthetic complications: no Comments: Pt converted to afib with RVR HR in low 100s. Highest HR I saw in phase 1 was ~125. Discussed with French Ana with Dr. Rennis Chris and told pt. To f/u with cardiology ASAP. Will move to phase 2 and reassess.    No notable events documented.  Last Vitals:  Vitals:   08/13/22 1130 08/13/22 1200  BP: (!) 163/86 (!) 146/88  Pulse: 75 100  Resp: 12 15  Temp:    SpO2: 94% 90%    Last Pain:  Vitals:   08/13/22 1130  TempSrc:   PainSc: 0-No pain                 Lewie Loron

## 2022-08-13 NOTE — Op Note (Signed)
08/13/2022  9:48 AM  PATIENT:   Miranda [name]  77 y.o. female  PRE-OPERATIVE DIAGNOSIS:  Left shoulder rotator cuff tear arthropathy  POST-OPERATIVE DIAGNOSIS: Same  PROCEDURE: Left shoulder reverse arthroplasty utilizing a press-fit size 10 Arthrex stem with a neutral metathesis, +3 polyethylene insert, 36/+4 glenosphere and a small/+2 baseplate  SURGEON:  Harjot Zavadil, Vania Rea M.D.  ASSISTANTS: Ralene Bathe, PA-C  Ralene Bathe, PA-C was utilized as an Geophysicist/field seismologist throughout this case, essential for help with positioning the patient, positioning extremity, tissue manipulation, implantation of the prosthesis, suture management, wound closure, and intraoperative decision-making.  ANESTHESIA:   General endotracheal and interscalene block with Exparel  EBL: 100 cc  SPECIMEN: None  Drains: None   PATIENT DISPOSITION:  PACU - hemodynamically stable.    PLAN OF CARE: Discharge to home after PACU  Brief history:  Patient is a 77 year old female with chronic and progressive increasing left shoulder pain related to severe rotator cuff tear arthropathy.  Due to her increasing functional mutations and failure to respond to prolonged attempts of conservative management, she is brought to the operating this time for planned left shoulder reverse arthroplasty.  Preoperatively, I counseled the patient regarding treatment options and risks versus benefits thereof.  Possible surgical complications were all reviewed including potential for bleeding, infection, neurovascular injury, persistent pain, loss of motion, anesthetic complication, failure of the implant, and possible need for additional surgery. They understand and accept and agrees with our planned procedure.   Procedure detail:  After undergoing routine preop evaluation the patient received prophylactic antibiotics and interscalene block with Exparel was established in the holding area by the anesthesia department.  Patient was  subsequently placed spine on the operating table and underwent the smooth induction of a general endotracheal anesthesia.  Placed into the beachchair position and appropriately padded and protected.  The left shoulder girdle region was sterilely prepped and draped in standard fashion.  Timeout was called.  A deltopectoral approach left shoulder is made an approximately 8 cm incision.  Skin flaps elevated dissection carried deeply the deltopectoral interval was then developed from proximal to this with the vein taken laterally.  The long head biceps tendon was then tenodesed at the upper border the pectoralis major tendon with the proximal segment unroofed and excised.  Conjoined tendon retracted medially.  The subscapularis was then separated from the lesser tuberosity using electrocautery the free margin tagged with a pair of grasping suture tape sutures in the superior rotator cuff was then split to the base of the coracoid and capsular attachments were then divided from the anterior and inferior margins of the humeral neck allowing deliver the humeral head through the wound.  An extra medullary guide was then used to outline the proposed humeral head resection which we performed with an oscillating saw at approximately 20 degrees of retroversion.  A metal cap was then placed over the cut proximal humeral surface after the marginal osteophytes were removed.  The glenoid was then exposed and a circumferential labral resection was completed.  A guidepin was then directed into the center of the glenoid and the glenoid was then prepared with the central followed by the peripheral reamer to a stable [place] bony bed.  Preparation completed with the drill and tap for a 30 mm lag screw.  The baseplate was then assembled and inserted with vancomycin powder applied to the threads of the lag screw with excellent fixation achieved.  The peripheral locking screws were all then placed using standard technique with  excellent  fixation.  A 36/+4 glenosphere was then impacted onto the baseplate and a central locking screw was placed.  Attention returned to the humeral canal where the canal was opened and we broached up to a size 10 at 20 degrees retroversion with excellent fixation.  A neutral metaphyseal reaming guide was then used to prepare the metaphysis.  This point trial implant was placed trial reduction showed good motion good stability and good soft tissue balance.  The trial was removed.  A final implant was then assembled.  The canal was irrigated cleaned and dried with vancomycin powder sprayed into the canal.  Our final implant was then seated with excellent fixation.  A series of trial reductions was then performed and we felt that the +3 poly gave is the best motion stability and soft tissue balance.  Trial was then removed.  The final poly was then impacted.  Final reduction showed excellent motion stability and soft tissue balance all much to our satisfaction.  The wound was copes irrigated cleaned and dried.  We confirmed good elasticity of the subscapularis tendon was then repaired back to the eyelets on the collar of the implant using the previously placed suture tape sutures.  The arm easily achieve 45 degrees of external rotation without excessive tension on the subscap.  This point final irrigation completed.  The balance of the vancomycin powder was then sprayed liberally throughout the deep soft tissue planes.  The deltopectoral interval was reapproximated with a series of figure-of-eight number Vicryl sutures.  2-0 Monocryl used to close the subcu layer and intracuticular 3-0 Monocryl used to close the skin followed by Dermabond and Aquacel dressing.  The left arm was placed into a sling and the patient was awakened, extubated, and taken to the recovery room in stable condition.  Senaida Lange MD   Contact # [phone]

## 2022-08-13 NOTE — Anesthesia Procedure Notes (Signed)
Anesthesia Regional Block: Interscalene brachial plexus block   Pre-Anesthetic Checklist: , timeout performed,  Correct Patient, Correct Site, Correct Laterality,  Correct Procedure, Correct Position, site marked,  Risks and benefits discussed,  Surgical consent,  Pre-op evaluation,  At surgeon's request and post-op pain management  Laterality: Upper and Left  Prep: chloraprep       Needles:  Injection technique: Single-shot  Needle Type: Stimulator Needle - 40     Needle Length: 4cm  Needle Gauge: 22     Additional Needles:   Procedures:,,,, ultrasound used (permanent image in chart),,    Narrative:  Start time: 08/13/2022 8:05 AM End time: 08/13/2022 8:25 AM Injection made incrementally with aspirations every 5 mL.  Performed by: Personally  Anesthesiologist: Lewie Loron, MD  Additional Notes: BP cuff, SpO2 and EKG monitors applied. Sedation begun. Nerve location verified with ultrasound. Anesthetic injected incrementally, slowly, and after neg aspirations under direct u/s guidance. Good perineural spread. Tolerated well.

## 2022-08-13 NOTE — Transfer of Care (Signed)
Immediate Anesthesia Transfer of Care Note  Patient: Miranda [name]  Procedure(s) Performed: REVERSE SHOULDER ARTHROPLASTY (Left: Shoulder)  Patient Location: PACU  Anesthesia Type:General  Level of Consciousness: awake, alert , and patient cooperative  Airway & Oxygen Therapy: Patient Spontanous Breathing and Patient connected to face mask oxygen  Post-op Assessment: Report given to RN, Post -op Vital signs reviewed and stable, and Patient moving all extremities X 4  Post vital signs: Reviewed and stable  Last Vitals:  Vitals Value Taken Time  BP 171/80 08/13/22 1006  Temp    Pulse 64 08/13/22 1011  Resp 14 08/13/22 1011  SpO2 100 % 08/13/22 1011  Vitals shown include unvalidated device data.  Last Pain:  Vitals:   08/13/22 0748  TempSrc:   PainSc: 6       Patients Stated Pain Goal: 4 (08/13/22 0748)  Complications: No notable events documented.

## 2022-08-14 ENCOUNTER — Other Ambulatory Visit (HOSPITAL_COMMUNITY): Payer: PPO

## 2022-08-14 ENCOUNTER — Encounter (HOSPITAL_COMMUNITY): Payer: Self-pay | Admitting: Orthopedic Surgery

## 2022-08-14 DIAGNOSIS — E785 Hyperlipidemia, unspecified: Secondary | ICD-10-CM | POA: Diagnosis not present

## 2022-08-14 DIAGNOSIS — I10 Essential (primary) hypertension: Secondary | ICD-10-CM | POA: Diagnosis not present

## 2022-08-14 DIAGNOSIS — M75102 Unspecified rotator cuff tear or rupture of left shoulder, not specified as traumatic: Secondary | ICD-10-CM | POA: Diagnosis not present

## 2022-08-14 DIAGNOSIS — I480 Paroxysmal atrial fibrillation: Secondary | ICD-10-CM | POA: Diagnosis not present

## 2022-08-14 DIAGNOSIS — R079 Chest pain, unspecified: Secondary | ICD-10-CM | POA: Diagnosis not present

## 2022-08-14 MED ORDER — HYDROCODONE-ACETAMINOPHEN 10-325 MG PO TABS: 1.0000 | ORAL_TABLET | Freq: Four times a day (QID) | ORAL | 0 refills | Status: DC | PRN

## 2022-08-14 NOTE — Progress Notes (Addendum)
Rounding Note    Patient Name: Miranda [name] Date of Encounter: 08/14/2022  [facility] Cardiologist: Peter Swaz[place], MD   Subjective   She feels ok this AM. Was asking about a good BM regimen  Inpatient Medications    Scheduled Meds:  amLODipine  5 mg Oral Daily   apixaban  5 mg Oral BID   atorvastatin  80 mg Oral QPM   dronedarone  400 mg Oral BID WC   ezetimibe  10 mg Oral Daily   hydrochlorothiazide  25 mg Oral Daily   losartan  100 mg Oral QPM   metoprolol tartrate  12.5 mg Oral Q6H   Pirfenidone  1 tablet Oral TID   Continuous Infusions:  lactated ringers 75 mL/hr at 08/13/22 1711   methocarbamol (ROBAXIN) IV     PRN Meds: acetaminophen, acetaminophen, albuterol, ALPRAZolam, HYDROmorphone (DILAUDID) injection, methocarbamol **OR** methocarbamol (ROBAXIN) IV, metoCLOPramide **OR** metoCLOPramide (REGLAN) injection, metoprolol tartrate, ondansetron **OR** ondansetron (ZOFRAN) IV, mouth rinse, oxyCODONE, oxyCODONE   Vital Signs    Vitals:   08/13/22 2310 08/14/22 0125 08/14/22 0547 08/14/22 0948  BP:  (!) 120/57 (!) 116/52 (!) 120/54  Pulse: 60 (!) 49 (!) 57 (!) 55  Resp:  18 19 18   Temp:  98.1 F (36.7 C) 98.1 F (36.7 C) 98.1 F (36.7 C)  TempSrc:    Oral  SpO2:  99% 94% 100%  Weight:      Height:        Intake/Output Summary (Last 24 hours) at 08/14/2022 1119 Last data filed at 08/14/2022 1100 Gross per 24 hour  Intake 2350.71 ml  Output --  Net 2350.71 ml      08/13/2022    7:48 AM 08/05/2022    9:51 AM 05/21/2022    2:31 PM  Last 3 Weights  Weight (lbs) 178 lb 3.2 oz 178 lb 3.2 oz 183 lb 6.4 oz  Weight (kg) 80.831 kg 80.831 kg 83.19 kg      Telemetry    Sinus bradycardia rates 30s-50s - Personally Reviewed  ECG    No new- Personally Reviewed  Physical Exam   Vitals:   08/14/22 0547 08/14/22 0948  BP: (!) 116/52 (!) 120/54  Pulse: (!) 57 (!) 55  Resp: 19 18  Temp: 98.1 F (36.7 C) 98.1 F (36.7 C)  SpO2: 94% 100%     GEN: No acute distress.   Cardiac: RRR, limited with arm brace  Respiratory: Clear to auscultation bilaterally. GI: Soft, nontender, non-distended  MS: No edema; No deformity. Neuro:  Nonfocal  Psych: Normal affect   Labs    High Sensitivity Troponin:   Recent Labs  Lab 08/13/22 1611 08/13/22 1908 08/13/22 2111  TROPONINIHS 16 14 13      ChemistryNo results for input(s): "NA", "K", "CL", "CO2", "GLUCOSE", "BUN", "CREATININE", "CALCIUM", "MG", "PROT", "ALBUMIN", "AST", "ALT", "ALKPHOS", "BILITOT", "GFRNONAA", "GFRAA", "ANIONGAP" in the last 168 hours.  Lipids No results for input(s): "CHOL", "TRIG", "HDL", "LABVLDL", "LDLCALC", "CHOLHDL" in the last 168 hours.  HematologyNo results for input(s): "WBC", "RBC", "HGB", "HCT", "MCV", "MCH", "MCHC", "RDW", "PLT" in the last 168 hours. Thyroid No results for input(s): "TSH", "FREET4" in the last 168 hours.  BNPNo results for input(s): "BNP", "PROBNP" in the last 168 hours.  DDimer No results for input(s): "DDIMER" in the last 168 hours.   Radiology    No results found.  Cardiac Studies   TTE 04/14/2021 1. Left ventricular ejection fraction, by estimation, is 55 to 60%. The  left ventricle has normal function. The left ventricle has no regional  wall motion abnormalities. Left ventricular diastolic parameters are  consistent with Grade I diastolic  dysfunction (impaired relaxation).   2. Right ventricular systolic function is normal. The right ventricular  size is normal. There is normal pulmonary artery systolic pressure.   3. The mitral valve is normal in structure. No evidence of mitral valve  regurgitation. No evidence of mitral stenosis.   4. The aortic valve is normal in structure. Aortic valve regurgitation is  not visualized. Aortic valve sclerosis/calcification is present, without  any evidence of aortic stenosis.   5. The inferior vena cava is normal in size with greater than 50%  respiratory variability, suggesting  right atrial pressure of 3 mmHg.    Holter 03/17/2018 Minimum HR: 34 BPM at 6:44:52 AM(2) Maximum HR: 91 BPM at 11:52:10 AM(2) Average HR: 59 BPM Rare PACs and PVCs Zero atrial fibrillation Sinus rhythm with occasional atrial and ventricular ectopy Bradycardia with HR in the 30s between 6-7AM Sitting with arm pain and walking with fatigue associated with sinus rhythm   Myoview 11/12/2017 Nuclear stress EF: 66%. There was no ST segment deviation noted during stress. No T wave inversion was noted during stress. The study is normal. This is a low risk study. The left ventricular ejection fraction is hyperdynamic (>65%).  Patient Profile     CHIRSTINE [name] is a 77 y.o. female with a hx of hypertension, hyperlipidemia, OSA on CPAP, DM 2, and atrial fibrillation 08/13/2022 for the evaluation of atrial fibrillation at the request of Dr. Rennis Chris.   Assessment & Plan    Paroxysmal Atrial Fibrillation: she has known hx of afib on multaq. Seems to have maintained sinus rhythm, but notes occasional symptoms. Her rates are predominantly rate controlled.  She's going in and out of afib, may maintain sinus overnight - back in sinus rhythm -HR 30s-50s, will stop BB - continue home multaq 400 mg BID - continue home eliquis 5 mg BID   Chest pressure: Not related to coronary disease. EKG does not show ischemic changes. Troponin was negative.  Don't feel strongly for echo at this juncture. Will work on cancelling   HTN: continue home norvasc 5 mg daily, HCTz 25 mg daily, losartan 100 mg daily  HLD: on lipitor 80 mg daily and zetia. LDL <70 mg/dL   ILD: per primary team. On pirfenidone   DM2: A1c <7.   She can be discharged today from a cardiac perspective. Will work on [employer]  For questions or updates, please contact [facility] Please consult [email or web address] for contact info under        Signed, Maisie Fus, MD  08/14/2022, 11:19 AM

## 2022-08-14 NOTE — Progress Notes (Signed)
Occupational Therapy Treatment Patient Details Name: Miranda [name] MRN: [id number] DOB: 01/20/1946 Today's Date: 08/14/2022   History of present illness Pt ia a 77 year old woman admitted on 5/31 for L reverse TSA. Hospital course complicated by afib, pt admitted for observation. PMH: HTN, HLD, OSA, DM2, afib, CHF, anxiety, anemia, CKD, DDD with chronic pain.   OT comments  Pt with improved pain control. Able to mobilize modified independently and demonstrate L shoulder pendulums, lap slides, A/AROM elbow to hand (nerve block partially intact). Reinforced NWB on L UE, compensatory strategies for ADLs and donning and doffing sling with thumb loop.    Recommendations for follow up therapy are one component of a multi-disciplinary discharge planning process, led by the attending physician.  Recommendations may be updated based on patient status, additional functional criteria and insurance authorization.    Assistance Recommended at Discharge Intermittent Supervision/Assistance  Patient can return home with the following  A little help with bathing/dressing/bathroom;Assistance with cooking/housework;Assist for transportation;Help with stairs or ramp for entrance   Equipment Recommendations  None recommended by OT    Recommendations for Other Services      Precautions / Restrictions Precautions Precautions: Shoulder Type of Shoulder Precautions: ok for pendulums, lap slides, ER 20*, FF 60*, ABD 45* Shoulder Interventions: Shoulder sling/immobilizer;Off for dressing/bathing/exercises (can remove in controlled environment) Precaution Booklet Issued: Yes (comment) Required Braces or Orthoses: Sling Restrictions Weight Bearing Restrictions: Yes LUE Weight Bearing: Non weight bearing       Mobility Bed Mobility Overal bed mobility: Modified Independent Bed Mobility: Rolling, Sidelying to Sit Rolling: Min assist Sidelying to sit: Min assist       General bed mobility comments: Up  to R side of bed to decrease likelihood of weight bearing through L shoulder.    Transfers Overall transfer level: Modified independent   Transfers: Sit to/from Stand Sit to Stand: Supervision           General transfer comment: slow to rise     Balance Overall balance assessment: Needs assistance   Sitting balance-Leahy Scale: Good       Standing balance-Leahy Scale: Good                             ADL either performed or assessed with clinical judgement   ADL                                         General ADL Comments: Reinforced compensatory strategies for ADLs    Extremity/Trunk Assessment Upper Extremity Assessment Upper Extremity Assessment: LUE deficits/detail LUE Deficits / Details: completed lap slides, pendulums and A/AROM elbow to hand LUE Coordination: decreased gross motor   Lower Extremity Assessment Lower Extremity Assessment: Overall WFL for tasks assessed   Cervical / Trunk Assessment Cervical / Trunk Assessment: Other exceptions Cervical / Trunk Exceptions: DDD with chronic pain    Vision Ability to See in Adequate Light: 0 Adequate Patient Visual Report: No change from baseline     Perception     Praxis      Cognition Arousal/Alertness: Awake/alert Behavior During Therapy: WFL for tasks assessed/performed Overall Cognitive Status: Within Functional Limits for tasks assessed  Exercises      Shoulder Instructions       General Comments      Pertinent Vitals/ Pain       Pain Assessment Pain Assessment: Faces Faces Pain Scale: Hurts a little bit Pain Location: L shoulder Pain Descriptors / Indicators: Sore Pain Intervention(s): Premedicated before session, Ice applied  Home Living Family/patient expects to be discharged to:: Private residence Living Arrangements: Alone Available Help at Discharge: Family;Available 24 hours/day Type of  Home: House Home Access: Stairs to enter [employer] of Steps: 3 Entrance Stairs-Rails: Right;Left Home Layout: One level               Home Equipment: Cane - single point          Prior Functioning/Environment              Frequency  Min 2X/week        Progress Toward Goals  OT Goals(current goals can now be found in the care plan section)  Progress towards OT goals: Progressing toward goals  Acute Rehab OT Goals OT Goal Formulation: With patient Time For Goal Achievement: 08/28/22 Potential to Achieve Goals: Good ADL Goals Pt/caregiver will Perform Home Exercise Program: Increased ROM;Left upper extremity;With written HEP provided;Independently Additional ADL Goal #1: Pt and family will be knowledgeable in donning, doffing sling and positioning L shoulder for comfort in bed and chair. Additional ADL Goal #2: Pt and family will be knowledgeable in compensatory strategies for ADLs adhering to shoulder precautions.  Plan Discharge plan remains appropriate;Frequency remains appropriate    Co-evaluation                 AM-PAC OT "6 Clicks" Daily Activity     Outcome Measure   Help from another person eating meals?: A Little Help from another person taking care of personal grooming?: A Little Help from another person toileting, which includes using toliet, bedpan, or urinal?: A Little Help from another person bathing (including washing, rinsing, drying)?: A Little Help from another person to put on and taking off regular upper body clothing?: A Little Help from another person to put on and taking off regular lower body clothing?: A Little 6 Click Score: 18    End of Session Equipment Utilized During Treatment: Other (comment);Gait belt (sling)  OT Visit Diagnosis: Pain   Activity Tolerance Patient tolerated treatment well   Patient Left in bed;with call bell/phone within reach;with family/visitor present   Nurse Communication Patient  requests pain meds        Time: 1610-9604 OT Time Calculation (min): 33 min  Charges: OT General Charges $OT Visit: 1 Visit OT Evaluation $OT Eval Low Complexity: 1 Low OT Treatments $Self Care/Home Management : 8-22 mins $Therapeutic Exercise: 8-22 mins  Berna Spare, OTR/L Acute Rehabilitation Services Office: [phone]   Evern Bio 08/14/2022, 12:14 PM

## 2022-08-14 NOTE — Evaluation (Signed)
Occupational Therapy Evaluation Patient Details Name: Miranda [name] MRN: [id number] DOB: 04/20/1945 Today's Date: 08/14/2022   History of Present Illness Pt ia a 77 year old woman admitted on 5/31 for L reverse TSA. Hospital course complicated by afib, pt admitted for observation. PMH: HTN, HLD, OSA, DM2, afib, CHF, anxiety, anemia, CKD, DDD with chronic pain.   Clinical Impression   Pt walks with a cane intermittently and is typically independent in ADLs. Pt with significant shoulder pain this morning with even slight movement, RN provided pain meds. Educated pt and daughter in sling use, positioning L UE in bed and chair, compensatory strategies for ADLs, ice machine use and AROM limitations during ADLs. Pt unable to tolerate pendulums, lap slides and elbow to hand AROM due to pain. Will return after pain is addressed and instruct. Pt has written handouts to review prior to OTs return.      Recommendations for follow up therapy are one component of a multi-disciplinary discharge planning process, led by the attending physician.  Recommendations may be updated based on patient status, additional functional criteria and insurance authorization.   Assistance Recommended at Discharge Intermittent Supervision/Assistance  Patient can return home with the following A little help with bathing/dressing/bathroom;Assistance with cooking/housework;Assist for transportation;Help with stairs or ramp for entrance    Functional Status Assessment  Patient has had a recent decline in their functional status and demonstrates the ability to make significant improvements in function in a reasonable and predictable amount of time.  Equipment Recommendations  None recommended by OT    Recommendations for Other Services       Precautions / Restrictions Precautions Precautions: Shoulder Type of Shoulder Precautions: ok for pendulums, lapslides, ER 20*, FF 60*, ABD 45* Shoulder Interventions: Shoulder  sling/immobilizer;Off for dressing/bathing/exercises (can remove in controlled enviroment) Precaution Booklet Issued: Yes (comment) Required Braces or Orthoses: Sling Restrictions Weight Bearing Restrictions: Yes LUE Weight Bearing: Non weight bearing      Mobility Bed Mobility Overal bed mobility: Needs Assistance Bed Mobility: Rolling, Sidelying to Sit Rolling: Min assist Sidelying to sit: Min assist            Transfers Overall transfer level: Needs assistance   Transfers: Sit to/from Stand Sit to Stand: Supervision                  Balance                                           ADL either performed or assessed with clinical judgement   ADL                                         General ADL Comments: Educated pt and daughter in positioning L UE in bed and chair, sling positioning and wearing schedule and compensatory strategies for ADLs.     Vision Ability to See in Adequate Light: 0 Adequate Patient Visual Report: No change from baseline       Perception     Praxis      Pertinent Vitals/Pain Pain Assessment Pain Assessment: Faces Faces Pain Scale: Hurts whole lot Pain Location: L shoulder Pain Descriptors / Indicators: Aching, Crying Pain Intervention(s): Repositioned, Ice applied, Patient requesting pain meds-RN notified     Hand Dominance Right   Extremity/Trunk  Assessment Upper Extremity Assessment Upper Extremity Assessment: LUE deficits/detail LUE Deficits / Details: educated in pendulums, lap slides, AROM elbow to hand and AROM limitations LUE Coordination: decreased gross motor   Lower Extremity Assessment Lower Extremity Assessment: Overall WFL for tasks assessed   Cervical / Trunk Assessment Cervical / Trunk Assessment: Other exceptions Cervical / Trunk Exceptions: DDD with chronic pain   Communication Communication Communication: No difficulties   Cognition Arousal/Alertness:  Awake/alert Behavior During Therapy: WFL for tasks assessed/performed Overall Cognitive Status: Within Functional Limits for tasks assessed                                       General Comments       Exercises     Shoulder Instructions      Home Living Family/patient expects to be discharged to:: Private residence Living Arrangements: Alone Available Help at Discharge: Family;Available 24 hours/day Type of Home: House Home Access: Stairs to enter [employer] of Steps: 3 Entrance Stairs-Rails: Right;Left Home Layout: One level               Home Equipment: Cane - single point          Prior Functioning/Environment Prior Level of Function : Independent/Modified Independent                        OT Problem List: Pain;Impaired UE functional use      OT Treatment/Interventions: Self-care/ADL training;Therapeutic exercise;Patient/family education;Therapeutic activities    OT Goals(Current goals can be found in the care plan section) Acute Rehab OT Goals OT Goal Formulation: With patient Time For Goal Achievement: 08/28/22 Potential to Achieve Goals: Good ADL Goals Pt/caregiver will Perform Home Exercise Program: Increased ROM;Left upper extremity;With written HEP provided;Independently Additional ADL Goal #1: Pt and family will be knowledgeable in donning, doffing sling and positioning L shoulder for comfort in bed and chair. Additional ADL Goal #2: Pt and family will be knowledgeable in compensatory strategies for ADLs adhering to shoulder precautions.  OT Frequency: Min 2X/week    Co-evaluation              AM-PAC OT "6 Clicks" Daily Activity     Outcome Measure Help from another person eating meals?: A Little Help from another person taking care of personal grooming?: A Little Help from another person toileting, which includes using toliet, bedpan, or urinal?: A Little Help from another person bathing (including  washing, rinsing, drying)?: A Little Help from another person to put on and taking off regular upper body clothing?: A Little Help from another person to put on and taking off regular lower body clothing?: A Little 6 Click Score: 18   End of Session Nurse Communication: Patient requests pain meds  Activity Tolerance: Patient limited by pain Patient left: in bed;with call bell/phone within reach;with family/visitor present  OT Visit Diagnosis: Pain                Time: 4098-1191 OT Time Calculation (min): 32 min Charges:  OT General Charges $OT Visit: 1 Visit OT Evaluation $OT Eval Low Complexity: 1 Low OT Treatments $Self Care/Home Management : 8-22 mins  Berna Spare, OTR/L Acute Rehabilitation Services Office: [phone]   Evern Bio 08/14/2022, 9:35 AM

## 2022-08-14 NOTE — Progress Notes (Signed)
Miranda [name]  MRN: [id number] DOB/Age: 76-14-1947 76 y.o. [facility] Procedure: Procedure(s) (LRB): REVERSE SHOULDER ARTHROPLASTY (Left)     Subjective: Feels much better today, has actually been bradycardic overnight as oppsed to RVR in PACU yesterday  Vital Signs Temp:  [97.6 F (36.4 C)-98.2 F (36.8 C)] 98.1 F (36.7 C) (05/31 0547) Pulse Rate:  [49-145] 57 (05/31 0547) Resp:  [10-20] 19 (05/31 0547) BP: (116-187)/(52-94) 116/52 (05/31 0547) SpO2:  [89 %-100 %] 94 % (05/31 0547) Weight:  [80.8 kg] 80.8 kg (05/30 0748)  Lab Results No results for input(s): "WBC", "HGB", "HCT", "PLT" in the last 72 hours. BMET No results for input(s): "NA", "K", "CL", "CO2", "GLUCOSE", "BUN", "CREATININE", "CALCIUM" in the last 72 hours. INR  Date Value Ref Range Status  05/01/2016 1.10  Final     Exam Comfortable, block still in effect        Plan Will ask Cardiology to see and adjust meds but hopeful she can go home after they see her OT to see prior to DC Appreciate Cardiology assistance with [name] [name] PA-C  08/14/2022, 7:26 AM Contact # [phone] cell [phone]

## 2022-08-14 NOTE — Progress Notes (Signed)
   08/14/22 1037  TOC Brief Assessment  Insurance and Status Reviewed  Patient has primary care physician Yes  Home environment has been reviewed yes  Prior level of function: independent  Prior/Current Home Services No current home services  Social Determinants of Health Reivew SDOH reviewed no interventions necessary  Readmission risk has been reviewed Yes  Transition of care needs no transition of care needs at this time

## 2022-08-19 DIAGNOSIS — M5416 Radiculopathy, lumbar region: Secondary | ICD-10-CM | POA: Diagnosis not present

## 2022-08-19 DIAGNOSIS — M5136 Other intervertebral disc degeneration, lumbar region: Secondary | ICD-10-CM | POA: Diagnosis not present

## 2022-08-19 DIAGNOSIS — T402X5A Adverse effect of other opioids, initial encounter: Secondary | ICD-10-CM | POA: Diagnosis not present

## 2022-08-19 DIAGNOSIS — M5412 Radiculopathy, cervical region: Secondary | ICD-10-CM | POA: Diagnosis not present

## 2022-08-20 NOTE — Discharge Summary (Signed)
PATIENT ID:      Miranda [name]  MRN:     [id number] DOB/AGE:    [birth year] / 77 y.o.     DISCHARGE SUMMARY  ADMISSION DATE:    08/13/2022 DISCHARGE DATE:  08/14/2022  ADMISSION DIAGNOSIS: Left shoulder rotator cuff tear arthropathy Past Medical History:  Diagnosis Date   Anemia    Anxiety    Arthritis    Asthma    Atrial fibrillation (HCC)    CHF (congestive heart failure) (HCC)    pt. unsure- but thinks she was hosp. for CHF- 2002   [facility]   Clotting disorder (HCC)    blood clots in lungs/PE pulmonary embolism   Colon polyps    Complication of anesthesia    states requires a lot med. to put her to sleep    DDD (degenerative disc disease) 09/17/2011   Depression    "sometimes "   DM (diabetes mellitus) (HCC)    Family history of anesthesia complication    Sister had difficulty waking up from anesthesia   GERD (gastroesophageal reflux disease)    Glaucoma    bilateral, pt. admits that she is noncompliant to eye gtts.    Hemorrhoids    Hyperlipidemia    Hypertension    had stress, echo- 2006 /w Sneads, Cardiac Cath, per pt. 2002, echo repeated 2012- wnl    IPF (idiopathic pulmonary fibrosis) (HCC)    Low back pain    Shortness of breath    Sleep apnea    uses c-pap- q night recently    DISCHARGE DIAGNOSIS:   Principal Problem:   S/P reverse total shoulder arthroplasty, left   PROCEDURE: Procedure(s): REVERSE SHOULDER ARTHROPLASTY on 08/13/2022  CONSULTS:    HISTORY:  See H&P in chart.  HOSPITAL COURSE:  Miranda [name] is a 77 y.o. admitted on 08/13/2022 with a diagnosis of Left shoulder rotator cuff tear arthropathy.  They were brought to the operating room on 08/13/2022 and underwent Procedure(s): REVERSE SHOULDER ARTHROPLASTY.    They were given perioperative antibiotics:  Anti-infectives (From admission, onward)    Start     Dose/Rate Route Frequency Ordered Stop   08/13/22 0900  vancomycin (VANCOCIN) powder   Status:  Discontinued          As needed 08/13/22 0900 08/13/22 1528   08/13/22 0730  ceFAZolin (ANCEF) IVPB 2g/100 mL premix        2 g 200 mL/hr over 30 Minutes Intravenous On call to O.R. 08/13/22 0865 08/13/22 0901     .  Patient underwent the above named procedure and tolerated it well. The following day they were hemodynamically stable and pain was controlled on oral analgesics. They were neurovascularly intact to the operative extremity. OT was ordered and worked with patient per protocol. They were medically and orthopaedically stable for discharge on 08/14/2022.    DIAGNOSTIC STUDIES:  RECENT RADIOGRAPHIC STUDIES :  No results found.  RECENT VITAL SIGNS:  No data found.Marland Kitchen  RECENT EKG RESULTS:    Orders placed or performed during the hospital encounter of 08/13/22   EKG 12-Lead   EKG 12-Lead    DISCHARGE INSTRUCTIONS:    DISCHARGE MEDICATIONS:   Allergies as of 08/14/2022       Reactions   Fish Oil Anaphylaxis   Other Hives, Shortness Of Breath, Swelling   Allergic to cashew nuts and peanut oil.   Penicillins Anaphylaxis, Hives, Swelling   Has patient had a PCN  reaction causing immediate rash, facial/tongue/throat swelling, SOB or lightheadedness with hypotension: Face swelling and hives started first, then swelling of the throat  Has patient had a PCN reaction causing severe rash involving mucus membranes or skin necrosis: Yes  Has patient had a PCN reaction that required hospitalization: No  Has patient had a PCN reaction occurring within the last 10 years: Yes  If all of the above answers are "NO", then may proceed with Cephalospor   Pneumococcal Vaccines Nausea And Vomiting   Cashew Nut Oil    Nitrofurantoin Macrocrystal Hives   Peanut Oil    Aspirin Itching, Rash   Bactrim [sulfamethoxazole-trimethoprim] Hives, Itching, Rash   Ciprofloxacin Hives, Itching, Rash   Ibuprofen Rash   Influenza Vaccines Hives   Ivp Dye [iodinated Contrast Media] Hives, Itching,  Rash   Gives benadryl to counteract symptoms   Latex Rash   Macrobid [nitrofurantoin Monohydrate Macrocrystals] Hives   Shellfish Allergy Hives   Patient also allergic to seafood        Medication List     TAKE these medications    acetaminophen 325 MG tablet Commonly known as: TYLENOL Take 650 mg by mouth every 6 (six) hours as needed for moderate pain.   albuterol 108 (90 Base) MCG/ACT inhaler Commonly known as: VENTOLIN HFA Inhale 1 puff into the lungs every 6 (six) hours as needed.   ALPRAZolam 0.25 MG tablet Commonly known as: XANAX Take 1 tablet (0.25 mg total) by mouth 3 (three) times daily as needed.   amLODipine 5 MG tablet Commonly known as: NORVASC Take 1 tablet (5 mg total) by mouth daily.   apixaban 5 MG Tabs tablet Commonly known as: ELIQUIS Take 1 tablet (5 mg total) by mouth 2 (two) times daily.   atorvastatin 80 MG tablet Commonly known as: LIPITOR Take 1 tablet (80 mg total) by mouth every evening.   cyclobenzaprine 10 MG tablet Commonly known as: FLEXERIL Take 1 tablet (10 mg total) by mouth 3 (three) times daily as needed for muscle spasms.   Enulose 10 GM/15ML Soln Generic drug: lactulose (encephalopathy) take BY MOUTH three times daily AS NEEDED FOR mild constipation   ezetimibe 10 MG tablet Commonly known as: ZETIA Take 1 tablet (10 mg total) by mouth daily.   hydrochlorothiazide 25 MG tablet Commonly known as: HYDRODIURIL Take 1 tablet (25 mg total) by mouth daily.   HYDROcodone-acetaminophen 10-325 MG tablet Commonly known as: Norco Take 1 tablet by mouth every 6 (six) hours as needed. What changed: reasons to take this   linaclotide 145 MCG Caps capsule Commonly known as: Linzess TAKE ONE CAPSULE BY MOUTH BEFORE BREAKFAST   losartan 100 MG tablet Commonly known as: COZAAR Take 1 tablet (100 mg total) by mouth every evening.   Multaq 400 MG tablet Generic drug: dronedarone Take 1 tablet (400 mg total) by mouth 2  (two) times daily with a meal.   ondansetron 4 MG tablet Commonly known as: Zofran Take 1 tablet (4 mg total) by mouth every 8 (eight) hours as needed for nausea or vomiting.   Pirfenidone 801 MG Tabs Take 1 tablet (801 mg total) by mouth 3 (three) times daily.        FOLLOW UP VISIT:    Follow-up Information     Francena Hanly, MD. Go on 08/26/2022.   Specialty: Orthopedic Surgery Why: 08-26-2022 at 2:15 PM for -post-op Contact information: [address] [place] [address] [phone]  Ronney Asters, NP Follow up on 08/24/2022.   Specialty: Cardiology Why: 2:45PM. Cardiology follow up Contact information: [address] [place] [address] [phone]                 DISCHARGE TO: Home    DISCHARGE CONDITION:  Miranda [name] for Dr. Francena Hanly 08/20/2022, 10:26 AM

## 2022-08-23 NOTE — Progress Notes (Unsigned)
Cardiology Clinic Note   Patient Name: Miranda [name] Date of Encounter: 08/23/2022  Primary Care Provider:  Philip Aspen, Limmie Patricia, MD Primary Cardiologist:  Peter Swaziland, MD  Patient Profile    Miranda [name] 77 year old female presents to the clinic today for follow-up evaluation of her hypertension, coronary artery calcification, and atrial fibrillation.  Past Medical History    Past Medical History:  Diagnosis Date   Anemia    Anxiety    Arthritis    Asthma    Atrial fibrillation (HCC)    CHF (congestive heart failure) (HCC)    pt. unsure- but thinks she was hosp. for CHF- 2002   Chronic kidney disease    [facility]   Clotting disorder (HCC)    blood clots in lungs/PE pulmonary embolism   Colon polyps    Complication of anesthesia    states requires a lot med. to put her to sleep    DDD (degenerative disc disease) 09/17/2011   Depression    "sometimes "   DM (diabetes mellitus) (HCC)    Family history of anesthesia complication    Sister had difficulty waking up from anesthesia   GERD (gastroesophageal reflux disease)    Glaucoma    bilateral, pt. admits that she is noncompliant to eye gtts.    Hemorrhoids    Hyperlipidemia    Hypertension    had stress, echo- 2006 /w [place], Cardiac Cath, per pt. 2002, echo repeated 2012- wnl    IPF (idiopathic pulmonary fibrosis) (HCC)    Low back pain    Shortness of breath    Sleep apnea    uses c-pap- q night recently   Past Surgical History:  Procedure Laterality Date   ABDOMINAL HYSTERECTOMY     ectopic, fibroids   ANTERIOR CERVICAL DECOMP/DISCECTOMY FUSION N/A 09/11/2020   Procedure: Anterior Cervical Decompression Fusion Cervical four-five, Cervical five-six;  Surgeon: Bedelia Person, MD;  Location: [facility] OR;  Service: Neurosurgery;  Laterality: N/A;   arm surgery Right    CARDIAC CATHETERIZATION     CATARACT EXTRACTION, BILATERAL     cataracts removed bilateral- ?IOL   COLONOSCOPY      remote   FISSURECTOMY  10/08/2011   Procedure: FISSURECTOMY;  Surgeon: Almond Lint, MD;  Location: [facility] OR;  Service: General;  Laterality: N/A;   FLEXIBLE SIGMOIDOSCOPY  02/25/2011   Procedure: FLEXIBLE SIGMOIDOSCOPY;  Surgeon: Louis Meckel, MD;  Location: [facility] ENDOSCOPY;  Service: Endoscopy;  Laterality: N/A;   FOOT SURGERY     bilat, heel spurs- screw in R foot    HEMORRHOID SURGERY  10/08/2011   Procedure: HEMORRHOIDECTOMY;  Surgeon: Almond Lint, MD;  Location: [facility] OR;  Service: General;  Laterality: N/A;  External    REVERSE SHOULDER ARTHROPLASTY Left 08/13/2022   Procedure: REVERSE SHOULDER ARTHROPLASTY;  Surgeon: Francena Hanly, MD;  Location: [facility] ORS;  Service: Orthopedics;  Laterality: Left;    ROTATOR CUFF REPAIR Right    SPHINCTEROTOMY  10/08/2011   Procedure: SPHINCTEROTOMY;  Surgeon: Almond Lint, MD;  Location: [facility] OR;  Service: General;  Laterality: N/A;    Allergies  Allergies  Allergen Reactions   Fish Oil Anaphylaxis   Other Hives, Shortness Of Breath and Swelling    Allergic to cashew nuts and peanut oil.   Penicillins Anaphylaxis, Hives and Swelling    Has patient had a PCN reaction causing immediate rash, facial/tongue/throat swelling, SOB or lightheadedness with hypotension: Face swelling and hives started first, then swelling  of the throat  Has patient had a PCN reaction causing severe rash involving mucus membranes or skin necrosis: Yes  Has patient had a PCN reaction that required hospitalization: No  Has patient had a PCN reaction occurring within the last 10 years: Yes  If all of the above answers are "NO", then may proceed with Cephalospor   Pneumococcal Vaccines Nausea And Vomiting   Cashew Nut Oil    Nitrofurantoin Macrocrystal Hives   Peanut Oil    Aspirin Itching and Rash   Bactrim [Sulfamethoxazole-Trimethoprim] Hives, Itching and Rash   Ciprofloxacin Hives, Itching and Rash   Ibuprofen Rash   Influenza Vaccines Hives   Ivp Dye [Iodinated  Contrast Media] Hives, Itching and Rash    Gives benadryl to counteract symptoms   Latex Rash   Macrobid [Nitrofurantoin Monohydrate Macrocrystals] Hives   Shellfish Allergy Hives    Patient also allergic to seafood    History of Present Illness    Miranda [name] is a PMH of HTN, HLD, OSA on CPAP, coronary calcification, type 2 diabetes,, and paroxysmal atrial fibrillation.  She is a patient of Dr. Swaziland.  She was last seen in the hospital by Dr. Wyline Mood on 08/14/2022.  During that time she was being seen and evaluated for paroxysmal atrial fibrillation.  She has a known history of atrial fibrillation and is on Multaq.  At the time of evaluation she was maintaining sinus rhythm.  She did note occasional symptoms.  She was rate controlled.  Her heart rate was noted to be in the 30s-50s.  Her beta-blocker was discontinued.  She was continued on Multaq and apixaban.  She was also noted to have chest pressure.  This was not felt to be related to cardiac issues.  No ischemic changes were noted on EKG.  Troponins were negative.  Echocardiogram was discontinued.  She underwent reverse left shoulder arthroplasty 08/13/2022.  She presents to the clinic today for follow-up evaluation and states***.  *** denies chest pain, shortness of breath, lower extremity edema, fatigue, palpitations, melena, hematuria, hemoptysis, diaphoresis, weakness, presyncope, syncope, orthopnea, and PND.  Paroxysmal atrial fibrillation-heart rate today***.  Metoprolol tartrate 50 mg twice daily therapy recently discontinued due to low heart rate during admission for shoulder surgery.  Reports compliance with apixaban.  Denies bleeding issues. Continue Multaq, apixaban Avoid triggers caffeine, chocolate, EtOH, dehydration etc.  Essential hypertension-BP today***. Maintain blood pressure log Continue HCTZ, amlodipine, losartan Low-sodium diet  Hyperlipidemia-LDL***.  Goal less than 70. High-fiber diet Continue  atorvastatin, ezetimibe  Type 2 diabetes-A1c less than 7. Follows with PCP.  Disposition: Follow-up with Dr. Swaziland or me in 3-4 months. Home Medications    Prior to Admission medications   Medication Sig Start Date End Date Taking? Authorizing Provider  apixaban (ELIQUIS) 5 MG TABS tablet Take 1 tablet (5 mg total) by mouth 2 (two) times daily. 08/06/22   [name], Andree Coss, MD  acetaminophen (TYLENOL) 325 MG tablet Take 650 mg by mouth every 6 (six) hours as needed for moderate pain.    [provider]  albuterol (VENTOLIN HFA) 108 (90 Base) MCG/ACT inhaler Inhale 1 puff into the lungs every 6 (six) hours as needed. 06/23/19   Philip Aspen, Limmie Patricia, MD  ALPRAZolam Prudy Feeler) 0.25 MG tablet Take 1 tablet (0.25 mg total) by mouth 3 (three) times daily as needed. 06/25/22   Philip Aspen, Limmie Patricia, MD  amLODipine (NORVASC) 5 MG tablet Take 1 tablet (5 mg total) by mouth daily. 07/20/22  Philip Aspen, Limmie Patricia, MD  atorvastatin (LIPITOR) 80 MG tablet Take 1 tablet (80 mg total) by mouth every evening. 06/11/22   Philip Aspen, Limmie Patricia, MD  cyclobenzaprine (FLEXERIL) 10 MG tablet Take 1 tablet (10 mg total) by mouth 3 (three) times daily as needed for muscle spasms. 08/13/22   Shuford, French Ana, PA-C  dronedarone (MULTAQ) 400 MG tablet Take 1 tablet (400 mg total) by mouth 2 (two) times daily with a meal. 07/22/22   [name], Andree Coss, MD  ezetimibe (ZETIA) 10 MG tablet Take 1 tablet (10 mg total) by mouth daily. 06/11/22   Philip Aspen, Limmie Patricia, MD  hydrochlorothiazide (HYDRODIURIL) 25 MG tablet Take 1 tablet (25 mg total) by mouth daily. 06/11/22   Philip Aspen, Limmie Patricia, MD  HYDROcodone-acetaminophen [facility]) 10-325 MG tablet Take 1 tablet by mouth every 6 (six) hours as needed. 08/14/22   Shuford, French Ana, PA-C  lactulose, encephalopathy, (ENULOSE) 10 GM/15ML SOLN take BY MOUTH three times daily AS NEEDED FOR mild constipation 08/06/21   Philip Aspen, Limmie Patricia, MD   linaclotide [facility]) 145 MCG CAPS capsule TAKE ONE CAPSULE BY MOUTH BEFORE BREAKFAST Patient not taking: Reported on 08/03/2022 04/03/22   Sherrilyn Rist, MD  losartan (COZAAR) 100 MG tablet Take 1 tablet (100 mg total) by mouth every evening. 06/25/22   Philip Aspen, Limmie Patricia, MD  ondansetron (ZOFRAN) 4 MG tablet Take 1 tablet (4 mg total) by mouth every 8 (eight) hours as needed for nausea or vomiting. 08/13/22   Shuford, French Ana, PA-C  Pirfenidone 801 MG TABS Take 1 tablet (801 mg total) by mouth 3 (three) times daily. 05/29/22   Chilton Greathouse, MD    Family History    Family History  Problem Relation Age of Onset   Heart attack Mother    Heart disease Mother    Breast cancer Mother    Emphysema Sister    Breast cancer Sister    Arthritis/Rheumatoid Sister    Asthma Sister    Lung cancer Sister    COPD Sister    Colon cancer Brother    Colon cancer Brother    Anesthesia problems Neg Hx    Esophageal cancer Neg Hx    Rectal cancer Neg Hx    Stomach cancer Neg Hx    She indicated that her mother is deceased. She indicated that her father is deceased. She indicated that only one of her two brothers is alive. She indicated that her maternal grandmother is deceased. She indicated that her maternal grandfather is deceased. She indicated that her paternal grandmother is deceased. She indicated that her paternal grandfather is deceased. She indicated that the status of her neg hx is unknown.  Social History    Social History   Socioeconomic History   Marital status: Married    Spouse name: Not on file   Number of children: 4   Years of education: Not on file   Highest education level: Not on file  Occupational History   Occupation: retired Scientist, research (medical): UNEMPLOYED  Tobacco Use   Smoking status: Never    Passive exposure: Never   Smokeless tobacco: Never  Vaping Use   Vaping Use: Never used  Substance and Sexual Activity   Alcohol use: No    Alcohol/week: 0.0  standard drinks of alcohol   Drug use: No   Sexual activity: Never    Comment: hyst  Other Topics Concern   Not on file  Social History Narrative  Not on file   Social Determinants of Health   Financial Resource Strain: Low Risk  (01/09/2022)   Overall Financial Resource Strain (CARDIA)    Difficulty of Paying Living Expenses: Not very hard  Food Insecurity: No Food Insecurity (08/13/2022)   Hunger Vital Sign    Worried About Running Out of Food in the Last Year: Never true    Ran Out of Food in the Last Year: Never true  Transportation Needs: No Transportation Needs (08/13/2022)   PRAPARE - Administrator, Civil Service (Medical): No    Lack of Transportation (Non-Medical): No  Physical Activity: Not on file  Stress: Not on file  Social Connections: Not on file  Intimate Partner Violence: Not At Risk (08/13/2022)   Humiliation, Afraid, Rape, and Kick questionnaire    Fear of Current or Ex-Partner: No    Emotionally Abused: No    Physically Abused: No    Sexually Abused: No     Review of Systems    General:  No chills, fever, night sweats or weight changes.  Cardiovascular:  No chest pain, dyspnea on exertion, edema, orthopnea, palpitations, paroxysmal nocturnal dyspnea. Dermatological: No rash, lesions/masses Respiratory: No cough, dyspnea Urologic: No hematuria, dysuria Abdominal:   No nausea, vomiting, diarrhea, bright red blood per rectum, melena, or hematemesis Neurologic:  No visual changes, wkns, changes in mental status. All other systems reviewed and are otherwise negative except as noted above.  Physical Exam    VS:  There were no vitals taken for this visit. , BMI There is no height or weight on file to calculate BMI. GEN: Well nourished, well developed, in no acute distress. HEENT: normal. Neck: Supple, no JVD, carotid bruits, or masses. Cardiac: RRR, no murmurs, rubs, or gallops. No clubbing, cyanosis, edema.  Radials/DP/PT 2+ and equal  bilaterally.  Respiratory:  Respirations regular and unlabored, clear to auscultation bilaterally. GI: Soft, nontender, nondistended, BS + x 4. MS: no deformity or atrophy. Skin: warm and dry, no rash. Neuro:  Strength and sensation are intact. Psych: Normal affect.  Accessory Clinical Findings    Recent Labs: 02/19/2022: ALT 30 08/05/2022: BUN 16; Creatinine, Ser 0.89; Hemoglobin 12.8; Platelets 176; Potassium 3.5; Sodium 135   Recent Lipid Panel    Component Value Date/Time   CHOL 145 02/19/2022 1404   CHOL 167 11/26/2017 0908   TRIG 60.0 02/19/2022 1404   HDL 72.60 02/19/2022 1404   HDL 61 11/26/2017 0908   CHOLHDL 2 02/19/2022 1404   VLDL 12.0 02/19/2022 1404   LDLCALC 60 02/19/2022 1404   LDLCALC 90 12/01/2019 1025   LDLDIRECT 152.8 03/30/2011 0859    No BP recorded.  {Refresh Note OR Click here to enter BP  :1}***    ECG personally reviewed by me today- *** - No acute changes   Echocardiogram 04/14/2021  1. Left ventricular ejection fraction, by estimation, is 55 to 60%. The  left ventricle has normal function. The left ventricle has no regional  wall motion abnormalities. Left ventricular diastolic parameters are  consistent with Grade I diastolic  dysfunction (impaired relaxation).   2. Right ventricular systolic function is normal. The right ventricular  size is normal. There is normal pulmonary artery systolic pressure.   3. The mitral valve is normal in structure. No evidence of mitral valve  regurgitation. No evidence of mitral stenosis.   4. The aortic valve is normal in structure. Aortic valve regurgitation is  not visualized. Aortic valve sclerosis/calcification is present, without  any  evidence of aortic stenosis.   5. The inferior vena cava is normal in size with greater than 50%  respiratory variability, suggesting right atrial pressure of 3 mmHg.    Holter 03/17/2018 Minimum HR: 34 BPM at 6:44:52 AM(2) Maximum HR: 91 BPM at 11:52:10 AM(2) Average  HR: 59 BPM Rare PACs and PVCs Zero atrial fibrillation Sinus rhythm with occasional atrial and ventricular ectopy Bradycardia with HR in the 30s between 6-7AM Sitting with arm pain and walking with fatigue associated with sinus rhythm   Myoview 11/12/2017 Nuclear stress EF: 66%. There was no ST segment deviation noted during stress. No T wave inversion was noted during stress. The study is normal. This is a low risk study. The left ventricular ejection fraction is hyperdynamic (>65%).    Assessment & Plan   1.  ***   Miranda [name]. Miranda Reimann NP-C     08/23/2022, 11:01 AM [facility] [address] Office [phone] Fax [phone]    I spent***minutes examining this patient, reviewing medications, and using patient centered shared decision making involving her cardiac care.  Prior to her visit I spent greater than 20 minutes reviewing her past medical history,  medications, and prior cardiac tests.

## 2022-08-24 ENCOUNTER — Encounter: Payer: Self-pay | Admitting: General Practice

## 2022-08-24 ENCOUNTER — Ambulatory Visit: Payer: PPO | Attending: General Practice | Admitting: General Practice

## 2022-08-24 VITALS — BP 134/88 | HR 67 | Ht 65.0 in | Wt 182.0 lb

## 2022-08-24 DIAGNOSIS — E119 Type 2 diabetes mellitus without complications: Secondary | ICD-10-CM

## 2022-08-24 DIAGNOSIS — E785 Hyperlipidemia, unspecified: Secondary | ICD-10-CM | POA: Diagnosis not present

## 2022-08-24 DIAGNOSIS — I480 Paroxysmal atrial fibrillation: Secondary | ICD-10-CM | POA: Diagnosis not present

## 2022-08-24 DIAGNOSIS — I10 Essential (primary) hypertension: Secondary | ICD-10-CM

## 2022-08-24 MED ORDER — APIXABAN 5 MG PO TABS: 5.0000 mg | ORAL_TABLET | Freq: Two times a day (BID) | ORAL | 0 refills | Status: DC

## 2022-08-24 NOTE — Patient Instructions (Addendum)
Medication Instructions:  NONE  CONTINUE CURRENT MEDICATIONS *If you need a refill on your cardiac medications before your next appointment, please call your pharmacy*   Lab Work: NONE If you have labs (blood work) drawn today and your tests are completely normal, you will receive your results only by: MyChart Message (if you have MyChart) OR A paper copy in the mail If you have any lab test that is abnormal or we need to change your treatment, we will call you to review the results.   Testing/Procedures: NONE   Follow-Up: At [facility], you and your health needs are our priority.  As part of our continuing mission to provide you with exceptional heart care, we have created designated Provider Care Teams.  These Care Teams include your primary Cardiologist (physician) and Advanced Practice Providers (APPs -  Physician Assistants and Nurse Practitioners) who all work together to provide you with the care you need, when you need it.  We recommend signing up for the patient portal called "MyChart".  Sign up information is provided on this After Visit Summary.  MyChart is used to connect with patients for Virtual Visits (Telemedicine).  Patients are able to view lab/test results, encounter notes, upcoming appointments, etc.  Non-urgent messages can be sent to your provider as well.   To learn more about what you can do with MyChart, go to [email or web address].    Your next appointment:   4-6 month(s)  Provider:   Peter Swaziland, MD     Other Instructions TAKE AND LOG YOUR BLOOD PRESSURE

## 2022-08-26 DIAGNOSIS — Z471 Aftercare following joint replacement surgery: Secondary | ICD-10-CM | POA: Diagnosis not present

## 2022-08-26 DIAGNOSIS — Z96612 Presence of left artificial shoulder joint: Secondary | ICD-10-CM | POA: Diagnosis not present

## 2022-09-07 ENCOUNTER — Other Ambulatory Visit: Payer: Self-pay | Admitting: Pharmacist

## 2022-09-07 DIAGNOSIS — J84112 Idiopathic pulmonary fibrosis: Secondary | ICD-10-CM

## 2022-09-07 MED ORDER — PIRFENIDONE 801 MG PO TABS
1.0000 | ORAL_TABLET | Freq: Three times a day (TID) | ORAL | 1 refills | Status: DC
Start: 2022-09-07 — End: 2023-04-01

## 2022-09-13 DIAGNOSIS — G4733 Obstructive sleep apnea (adult) (pediatric): Secondary | ICD-10-CM | POA: Diagnosis not present

## 2022-09-14 DIAGNOSIS — M25612 Stiffness of left shoulder, not elsewhere classified: Secondary | ICD-10-CM | POA: Insufficient documentation

## 2022-09-14 DIAGNOSIS — M25512 Pain in left shoulder: Secondary | ICD-10-CM | POA: Diagnosis not present

## 2022-09-18 DIAGNOSIS — Z96612 Presence of left artificial shoulder joint: Secondary | ICD-10-CM | POA: Insufficient documentation

## 2022-09-21 DIAGNOSIS — M25612 Stiffness of left shoulder, not elsewhere classified: Secondary | ICD-10-CM | POA: Diagnosis not present

## 2022-09-21 DIAGNOSIS — M25512 Pain in left shoulder: Secondary | ICD-10-CM | POA: Diagnosis not present

## 2022-09-24 DIAGNOSIS — M25612 Stiffness of left shoulder, not elsewhere classified: Secondary | ICD-10-CM | POA: Diagnosis not present

## 2022-09-24 DIAGNOSIS — M25512 Pain in left shoulder: Secondary | ICD-10-CM | POA: Diagnosis not present

## 2022-10-07 ENCOUNTER — Telehealth: Payer: Self-pay | Admitting: Internal Medicine

## 2022-10-07 NOTE — Telephone Encounter (Signed)
Prescription Request  10/07/2022  LOV: 05/21/2022  What is the name of the medication or equipment?  ALPRAZolam ALPRAZolam (XANAX) 0.25 MG tablet  Pt states she was not sure if she had any refills.  Pt was informed she was given 3 refills back in April, and she should probably call the pharmacy.  Have you contacted your pharmacy to request a refill? No   Which pharmacy would you like this sent to?   [facility] [address] ([address]), [place] - [address] [place]) [place] [address] Phone: [phone] Fax: [phone]  Patient notified that their request is being sent to the clinical staff for review and that they should receive a response within 2 business days.   Please advise at Mobile [phone] (mobile)

## 2022-10-07 NOTE — Telephone Encounter (Signed)
Spoke with pharmacist and refill will be ready today and the patient is aware.

## 2022-10-09 DIAGNOSIS — M25512 Pain in left shoulder: Secondary | ICD-10-CM | POA: Diagnosis not present

## 2022-10-09 DIAGNOSIS — M25612 Stiffness of left shoulder, not elsewhere classified: Secondary | ICD-10-CM | POA: Diagnosis not present

## 2022-10-12 ENCOUNTER — Telehealth: Payer: Self-pay | Admitting: Cardiology

## 2022-10-12 ENCOUNTER — Telehealth: Payer: Self-pay | Admitting: Pharmacist

## 2022-10-12 DIAGNOSIS — I4891 Unspecified atrial fibrillation: Secondary | ICD-10-CM

## 2022-10-12 DIAGNOSIS — M25612 Stiffness of left shoulder, not elsewhere classified: Secondary | ICD-10-CM | POA: Diagnosis not present

## 2022-10-12 DIAGNOSIS — M25512 Pain in left shoulder: Secondary | ICD-10-CM | POA: Diagnosis not present

## 2022-10-12 MED ORDER — APIXABAN 5 MG PO TABS
5.0000 mg | ORAL_TABLET | Freq: Two times a day (BID) | ORAL | Status: DC
Start: 2022-10-12 — End: 2022-12-09

## 2022-10-12 MED ORDER — APIXABAN 5 MG PO TABS
5.0000 mg | ORAL_TABLET | Freq: Two times a day (BID) | ORAL | 5 refills | Status: DC
Start: 2022-10-12 — End: 2022-10-20

## 2022-10-12 NOTE — Telephone Encounter (Signed)
Patient walked in for Eliquis samples. Do not think Rx was ever sent to pharmacy

## 2022-10-12 NOTE — Telephone Encounter (Signed)
Pt came into office to drop off Patient Assistance paperwork - I left paperwork in Dr. Elvis Coil mailbox.  10-12-22, J.Britt

## 2022-10-13 DIAGNOSIS — G4733 Obstructive sleep apnea (adult) (pediatric): Secondary | ICD-10-CM | POA: Diagnosis not present

## 2022-10-14 DIAGNOSIS — M25612 Stiffness of left shoulder, not elsewhere classified: Secondary | ICD-10-CM | POA: Diagnosis not present

## 2022-10-14 DIAGNOSIS — M25512 Pain in left shoulder: Secondary | ICD-10-CM | POA: Diagnosis not present

## 2022-10-15 NOTE — Telephone Encounter (Signed)
Called patient left message on personal voice mail you need to bring proof of your income before I can fax paperwork to patient assistance.

## 2022-10-19 DIAGNOSIS — M25512 Pain in left shoulder: Secondary | ICD-10-CM | POA: Diagnosis not present

## 2022-10-19 DIAGNOSIS — M25612 Stiffness of left shoulder, not elsewhere classified: Secondary | ICD-10-CM | POA: Diagnosis not present

## 2022-10-20 ENCOUNTER — Encounter: Payer: Self-pay | Admitting: Cardiology

## 2022-10-20 ENCOUNTER — Ambulatory Visit: Payer: PPO | Attending: Cardiology | Admitting: Cardiology

## 2022-10-20 VITALS — BP 130/66 | HR 57 | Ht 65.0 in | Wt 169.0 lb

## 2022-10-20 DIAGNOSIS — I2510 Atherosclerotic heart disease of native coronary artery without angina pectoris: Secondary | ICD-10-CM | POA: Diagnosis not present

## 2022-10-20 DIAGNOSIS — I480 Paroxysmal atrial fibrillation: Secondary | ICD-10-CM | POA: Diagnosis not present

## 2022-10-20 DIAGNOSIS — I10 Essential (primary) hypertension: Secondary | ICD-10-CM | POA: Diagnosis not present

## 2022-10-20 DIAGNOSIS — D6869 Other thrombophilia: Secondary | ICD-10-CM

## 2022-10-20 DIAGNOSIS — G4733 Obstructive sleep apnea (adult) (pediatric): Secondary | ICD-10-CM

## 2022-10-20 MED ORDER — APIXABAN 5 MG PO TABS
5.0000 mg | ORAL_TABLET | Freq: Two times a day (BID) | ORAL | 0 refills | Status: DC
Start: 2022-10-20 — End: 2022-11-23

## 2022-10-20 NOTE — Progress Notes (Signed)
Electrophysiology Office Note:   Date:  10/20/2022  ID:  Miranda [name], DOB 1946/01/30, MRN [id number]  Primary Cardiologist: Miranda Swaziland, MD Electrophysiologist: Miranda Lemming, MD      History of Present Illness:   Miranda [name] is a 77 y.o. female with h/o atrial fibrillation seen today for routine electrophysiology followup.  Since last being seen in our clinic the patient reports doing well.  She is able to do all of her daily activities.  She has been doing better since her shoulder surgery in May.  She did have atrial fibrillation around her surgery.  Metoprolol was stopped.  She continues to have short episodes of atrial fibrillation but is happy with her control.  she denies chest pain, palpitations, dyspnea, PND, orthopnea, nausea, vomiting, dizziness, syncope, edema, weight gain, or early satiety.     He has a history significant for depression, GERD, hypertension, OSA on CPAP, diabetes, atrial fibrillation, coronary calcifications found on CT. She was diagnosed with PE in 2018 and started on Xarelto. She went to the emergency room and was found to be in atrial fibrillation. She is now on Multaq.        Review of systems complete and found to be negative unless listed in HPI.   EP Information / Studies Reviewed:    EKG is ordered today. Personal review as below.  EKG Interpretation Date/Time:  Tuesday October 20 2022 09:57:28 EDT Ventricular Rate:  57 PR Interval:  144 QRS Duration:  70 QT Interval:  446 QTC Calculation: 434 R Axis:   44  Text Interpretation: Sinus bradycardia When compared with ECG of 13-Aug-2022 11:59, Sinus rhythm has replaced Atrial fibrillation Vent. rate has decreased BY  54 BPM ST no longer depressed in Anterior leads Nonspecific T wave abnormality no longer evident in Inferior leads T wave inversion no longer evident in Lateral leads Confirmed by ,  ([id number]) on 10/20/2022 10:01:36 AM    Risk Assessment/Calculations:     CHA2DS2-VASc Score = 6   This indicates a 9.7% annual risk of stroke. The patient's score is based upon: CHF History: 0 HTN History: 1 Diabetes History: 1 Stroke History: 0 Vascular Disease History: 1 Age Score: 2 Gender Score: 1             Physical Exam:   VS:  BP 130/66 (BP Location: Right Arm, Patient Position: Sitting, Cuff Size: Normal)   Pulse (!) 57   Ht 5\' 5"  (1.651 m)   Wt 169 lb (76.7 kg)   SpO2 97%   BMI 28.12 kg/m    Wt Readings from Last 3 Encounters:  10/20/22 169 lb (76.7 kg)  08/24/22 182 lb (82.6 kg)  08/13/22 178 lb 3.2 oz (80.8 kg)     GEN: Well nourished, well developed in no acute distress NECK: No JVD; No carotid bruits CARDIAC: Regular rate and rhythm, no murmurs, rubs, gallops RESPIRATORY:  Clear to auscultation without rales, wheezing or rhonchi  ABDOMEN: Soft, non-tender, non-distended EXTREMITIES:  No edema; No deformity   ASSESSMENT AND PLAN:    1.  Paroxysmal atrial fibrillation: Currently on Eliquis and Multaq.  She has had some episodes of atrial fibrillation, but they are rare.  Her most recent episode was around the time of shoulder surgery.  She is happy with her control.  No changes.  2.  Hypertension: Currently well-controlled  3.  Hyperlipidemia: Continue Lipitor per primary cardiology.  4.  Obstructive sleep apnea: CPAP compliance encouraged  5.  Coronary artery disease:  Calcified LAD and circumflex found on CT scan.  No current chest pain.  6.  Secondary hypercoagulable state: Currently on Eliquis for atrial fibrillation  Follow up with EP APP in 6 months  Signed,  Jorja Loa, MD

## 2022-10-20 NOTE — Patient Instructions (Addendum)
Medication Instructions:  Your physician recommends that you continue on your current medications as directed. Please refer to the Current Medication list given to you today.  *If you need a refill on your cardiac medications before your next appointment, please call your pharmacy*   Lab Work: None ordered   Testing/Procedures: None ordered   Follow-Up: At [facility], you and your health needs are our priority.  As part of our continuing mission to provide you with exceptional heart care, we have created designated Provider Care Teams.  These Care Teams include your primary Cardiologist (physician) and Advanced Practice Providers (APPs -  Physician Assistants and Nurse Practitioners) who all work together to provide you with the care you need, when you need it.  We recommend signing up for the patient portal called "MyChart".  Sign up information is provided on this After Visit Summary.  MyChart is used to connect with patients for Virtual Visits (Telemedicine).  Patients are able to view lab/test results, encounter notes, upcoming appointments, etc.  Non-urgent messages can be sent to your provider as well.   To learn more about what you can do with MyChart, go to [email or web address].    Your next appointment:   6 month(s)  The format for your next appointment:   In Person  Provider:   You will follow up in the Atrial Fibrillation Clinic located at [facility]. Your provider will be: Clint R. Fenton, PA-C  Or  Landry Mellow, PA  Thank you for choosing [facility]!!   Dory Horn, RN [phone]

## 2022-10-20 NOTE — Telephone Encounter (Signed)
Spoke to patient she stated she will bring proof of her income tomorrow.She requested Eliquis 5 mg samples.Samples left at front desk.

## 2022-10-21 NOTE — Telephone Encounter (Signed)
Received patient's proof of income.Patient assistance paperwork faxed to Alver Fisher at fax # [phone].

## 2022-10-26 DIAGNOSIS — M25512 Pain in left shoulder: Secondary | ICD-10-CM | POA: Diagnosis not present

## 2022-10-26 DIAGNOSIS — M25612 Stiffness of left shoulder, not elsewhere classified: Secondary | ICD-10-CM | POA: Diagnosis not present

## 2022-10-28 DIAGNOSIS — M25512 Pain in left shoulder: Secondary | ICD-10-CM | POA: Diagnosis not present

## 2022-10-28 DIAGNOSIS — M25612 Stiffness of left shoulder, not elsewhere classified: Secondary | ICD-10-CM | POA: Diagnosis not present

## 2022-10-29 ENCOUNTER — Other Ambulatory Visit: Payer: Self-pay | Admitting: Internal Medicine

## 2022-10-29 DIAGNOSIS — I10 Essential (primary) hypertension: Secondary | ICD-10-CM

## 2022-10-30 ENCOUNTER — Telehealth: Payer: Self-pay

## 2022-10-30 NOTE — Telephone Encounter (Signed)
Called pt to inform her that her medication multaq 400 mg tablets, 3 bottles of 60 tablets, from [employer] pt assistance program, were at [facility] front desk for pt to pick up. I advised the pt that if she has any other problems, questions or concerns, to give our office a call. Pt verbalized understanding.  Lot# [id number]  Exp: 03/2025

## 2022-11-02 DIAGNOSIS — M25512 Pain in left shoulder: Secondary | ICD-10-CM | POA: Diagnosis not present

## 2022-11-02 DIAGNOSIS — M25612 Stiffness of left shoulder, not elsewhere classified: Secondary | ICD-10-CM | POA: Diagnosis not present

## 2022-11-04 DIAGNOSIS — M25612 Stiffness of left shoulder, not elsewhere classified: Secondary | ICD-10-CM | POA: Diagnosis not present

## 2022-11-04 DIAGNOSIS — Z471 Aftercare following joint replacement surgery: Secondary | ICD-10-CM | POA: Diagnosis not present

## 2022-11-04 DIAGNOSIS — M25512 Pain in left shoulder: Secondary | ICD-10-CM | POA: Diagnosis not present

## 2022-11-04 DIAGNOSIS — Z96612 Presence of left artificial shoulder joint: Secondary | ICD-10-CM | POA: Diagnosis not present

## 2022-11-09 DIAGNOSIS — M25512 Pain in left shoulder: Secondary | ICD-10-CM | POA: Diagnosis not present

## 2022-11-09 DIAGNOSIS — M25612 Stiffness of left shoulder, not elsewhere classified: Secondary | ICD-10-CM | POA: Diagnosis not present

## 2022-11-13 DIAGNOSIS — G4733 Obstructive sleep apnea (adult) (pediatric): Secondary | ICD-10-CM | POA: Diagnosis not present

## 2022-11-18 DIAGNOSIS — M25512 Pain in left shoulder: Secondary | ICD-10-CM | POA: Diagnosis not present

## 2022-11-18 DIAGNOSIS — M25612 Stiffness of left shoulder, not elsewhere classified: Secondary | ICD-10-CM | POA: Diagnosis not present

## 2022-11-19 DIAGNOSIS — M5412 Radiculopathy, cervical region: Secondary | ICD-10-CM | POA: Diagnosis not present

## 2022-11-19 DIAGNOSIS — T402X5A Adverse effect of other opioids, initial encounter: Secondary | ICD-10-CM | POA: Diagnosis not present

## 2022-11-19 DIAGNOSIS — M5416 Radiculopathy, lumbar region: Secondary | ICD-10-CM | POA: Diagnosis not present

## 2022-11-19 DIAGNOSIS — M5136 Other intervertebral disc degeneration, lumbar region: Secondary | ICD-10-CM | POA: Diagnosis not present

## 2022-11-23 ENCOUNTER — Other Ambulatory Visit: Payer: Self-pay

## 2022-11-23 DIAGNOSIS — M25612 Stiffness of left shoulder, not elsewhere classified: Secondary | ICD-10-CM | POA: Diagnosis not present

## 2022-11-23 DIAGNOSIS — M25512 Pain in left shoulder: Secondary | ICD-10-CM | POA: Diagnosis not present

## 2022-11-23 DIAGNOSIS — I4891 Unspecified atrial fibrillation: Secondary | ICD-10-CM

## 2022-11-23 MED ORDER — APIXABAN 5 MG PO TABS
5.0000 mg | ORAL_TABLET | Freq: Two times a day (BID) | ORAL | 0 refills | Status: DC
Start: 2022-11-23 — End: 2022-12-31

## 2022-11-25 DIAGNOSIS — M25512 Pain in left shoulder: Secondary | ICD-10-CM | POA: Diagnosis not present

## 2022-11-25 DIAGNOSIS — M25612 Stiffness of left shoulder, not elsewhere classified: Secondary | ICD-10-CM | POA: Diagnosis not present

## 2022-11-26 DIAGNOSIS — M5412 Radiculopathy, cervical region: Secondary | ICD-10-CM | POA: Diagnosis not present

## 2022-11-26 DIAGNOSIS — G4733 Obstructive sleep apnea (adult) (pediatric): Secondary | ICD-10-CM | POA: Diagnosis not present

## 2022-12-02 DIAGNOSIS — M25512 Pain in left shoulder: Secondary | ICD-10-CM | POA: Diagnosis not present

## 2022-12-02 DIAGNOSIS — M25612 Stiffness of left shoulder, not elsewhere classified: Secondary | ICD-10-CM | POA: Diagnosis not present

## 2022-12-04 ENCOUNTER — Telehealth: Payer: Self-pay | Admitting: Internal Medicine

## 2022-12-04 ENCOUNTER — Telehealth: Payer: Self-pay

## 2022-12-04 NOTE — Telephone Encounter (Signed)
Received letter from [employer] patient assistance.Denied for Eliquis.

## 2022-12-04 NOTE — Telephone Encounter (Signed)
Pt called to ask if she can have another referral to see a Biomedical engineer, again? Pt states she was seen there a few years ago, and had a colonoscopy. Pt says she feels like "something is wrong", and she needs to make an appointment with them very soon. Pt informed MD is OOO today.

## 2022-12-07 NOTE — Telephone Encounter (Signed)
Spoke to patient and an office visit was scheduled with Dr Clent Ridges.

## 2022-12-09 ENCOUNTER — Encounter: Payer: Self-pay | Admitting: Family Medicine

## 2022-12-09 ENCOUNTER — Ambulatory Visit: Payer: PPO | Admitting: Family Medicine

## 2022-12-09 VITALS — BP 124/64 | HR 49 | Temp 98.2°F | Wt 178.0 lb

## 2022-12-09 DIAGNOSIS — K5900 Constipation, unspecified: Secondary | ICD-10-CM

## 2022-12-09 MED ORDER — LACTULOSE ENCEPHALOPATHY 10 GM/15ML PO SOLN: ORAL | 2 refills | Status: DC

## 2022-12-09 MED ORDER — DOCUSATE SODIUM 100 MG PO CAPS: 100.0000 mg | ORAL_CAPSULE | Freq: Two times a day (BID) | ORAL | 2 refills | Status: DC

## 2022-12-09 NOTE — Progress Notes (Signed)
Subjective:    Patient ID: Miranda [name], female    DOB: 09/19/1945, 77 y.o.   MRN: [id number]  HPI Here for constipation. She normally uses Miralax mixed with prune juice every day, and sometimes she will add Lactulose if she gets backed up. She takes Linzess BID. Now for the past week she has had mild abdominal bloating and pain, and she is straining to push out a stool. She is averaging one very small stool a day. No fever or nausea. No bleeding.    Review of Systems  Constitutional: Negative.   Respiratory: Negative.    Cardiovascular: Negative.   Gastrointestinal:  Positive for abdominal distention, abdominal pain, constipation and rectal pain. Negative for anal bleeding, blood in stool, diarrhea, nausea and vomiting.  Genitourinary: Negative.        Objective:   Physical Exam Constitutional:      Appearance: Normal appearance. She is not ill-appearing.  Cardiovascular:     Rate and Rhythm: Normal rate. Rhythm irregular.     Pulses: Normal pulses.     Heart sounds: Normal heart sounds.  Pulmonary:     Effort: Pulmonary effort is normal.     Breath sounds: Normal breath sounds.  Abdominal:     General: Abdomen is flat. Bowel sounds are normal. There is no distension.     Palpations: Abdomen is soft. There is no mass.     Tenderness: There is no right CVA tenderness, left CVA tenderness, guarding or rebound.     Hernia: No hernia is present.     Comments: She is mildly tender in the LLQ. There are a few small external hemorrhoids, and her anus is tender. No bleeding is seen. No impaction   Neurological:     Mental Status: She is alert.           Assessment & Plan:  Constipation. She will continue using Mirilax daily and Linzess BID. We will add Colace 100 mg BID. She can then add Lactulose as needed.  Gershon Crane, MD

## 2022-12-14 ENCOUNTER — Telehealth: Payer: Self-pay | Admitting: Pharmacist

## 2022-12-14 DIAGNOSIS — M25612 Stiffness of left shoulder, not elsewhere classified: Secondary | ICD-10-CM | POA: Diagnosis not present

## 2022-12-14 DIAGNOSIS — G4733 Obstructive sleep apnea (adult) (pediatric): Secondary | ICD-10-CM | POA: Diagnosis not present

## 2022-12-14 DIAGNOSIS — M25512 Pain in left shoulder: Secondary | ICD-10-CM | POA: Diagnosis not present

## 2022-12-14 MED ORDER — APIXABAN 5 MG PO TABS: 5.0000 mg | ORAL_TABLET | Freq: Two times a day (BID) | ORAL | Status: DC

## 2022-12-14 NOTE — Telephone Encounter (Signed)
Eliquis: You can apply for patient assistance through [employer] patient assistance. The website for the application is [email or web address] The website can tell you if you meet the income requirement. Note to Medicare Patients: In addition to your application, you will need to submit documentation showing you have spent 3% of your annual household income on out-of-pocket prescription expenses for you and/or other members of your household for the year in which you are seeking assistance. Your pharmacy can provide this report.  Xarelto: Xarelto is a medication similar to Eliquis, but it is taken just once a day. The drug company offers a program called Xarelto with Me Coverage Gap Support. It runs from April 1-December 31. If you are in the coverage gap, you can get Xarelto for $89 (for 30 days) or $250 (for 90 days), plus sales tax if applicable. You can call to enroll at: 888-XARELTO [phone])  Dabigatran: Dabigatran is the generic of Pradaxa (it recently went generic). Like Eliquis and Xarelto, it does not require a lot of monitoring. You might consider contacting your insurance to see what the cost of this medication is through your insurance. There is also a GoodRx coupon for ~$69-75 per month if your insurance does not cover it well.   Warfarin: Warfarin is very inexpensive but does require more monitoring. You will need to come to clinic for weekly INR (blood test) checks with our Anticoagulation [facility] for the first month. These visits may have a copay. Then as we find a stable dose for you, you will start to come less often. You will be required to have your INR checked at least every 6 weeks. There are also more drug interactions and dietary interactions with warfarin.

## 2022-12-16 ENCOUNTER — Encounter: Payer: Self-pay | Admitting: Pulmonary Disease

## 2022-12-16 ENCOUNTER — Ambulatory Visit: Payer: PPO | Admitting: Pulmonary Disease

## 2022-12-16 VITALS — BP 133/69 | HR 54 | Temp 97.1°F | Ht 65.5 in | Wt 174.6 lb

## 2022-12-16 DIAGNOSIS — G4733 Obstructive sleep apnea (adult) (pediatric): Secondary | ICD-10-CM

## 2022-12-16 DIAGNOSIS — Z5181 Encounter for therapeutic drug level monitoring: Secondary | ICD-10-CM | POA: Diagnosis not present

## 2022-12-16 DIAGNOSIS — J84112 Idiopathic pulmonary fibrosis: Secondary | ICD-10-CM | POA: Diagnosis not present

## 2022-12-16 DIAGNOSIS — M25612 Stiffness of left shoulder, not elsewhere classified: Secondary | ICD-10-CM | POA: Diagnosis not present

## 2022-12-16 DIAGNOSIS — R0600 Dyspnea, unspecified: Secondary | ICD-10-CM | POA: Diagnosis not present

## 2022-12-16 DIAGNOSIS — M25512 Pain in left shoulder: Secondary | ICD-10-CM | POA: Diagnosis not present

## 2022-12-16 LAB — COMPREHENSIVE METABOLIC PANEL
ALT: 22 U/L (ref 0–35)
AST: 25 U/L (ref 0–37)
Albumin: 4.1 g/dL (ref 3.5–5.2)
Alkaline Phosphatase: 45 U/L (ref 39–117)
BUN: 13 mg/dL (ref 6–23)
CO2: 29 meq/L (ref 19–32)
Calcium: 10 mg/dL (ref 8.4–10.5)
Chloride: 104 meq/L (ref 96–112)
Creatinine, Ser: 0.87 mg/dL (ref 0.40–1.20)
GFR: 64.81 mL/min (ref >60.00)
Glucose, Bld: 94 mg/dL (ref 70–99)
Potassium: 3.9 meq/L (ref 3.5–5.1)
Sodium: 140 meq/L (ref 135–145)
Total Bilirubin: 0.4 mg/dL (ref 0.2–1.2)
Total Protein: 7.6 g/dL (ref 6.0–8.3)

## 2022-12-16 LAB — BRAIN NATRIURETIC PEPTIDE: Pro B Natriuretic peptide (BNP): 229 pg/mL — ABNORMAL HIGH (ref 0.0–100.0)

## 2022-12-16 NOTE — Progress Notes (Signed)
Miranda [name]    [id number]    01-26-1946  Primary Care Physician:Hernandez Priscella Mann, MD  Referring Physician: Philip Aspen, Limmie Patricia, MD [address] [place],  [place] [address]  Chief complaint: Follow-up for interstitial lung disease, OSA  HPI: Miranda [name] is here for follow-up of obstructive sleep apnea, pulmonary fibrosis. PE in 2018 opn anticoagulation, proxysmal atrial fib She has history of UIP fibrosis.  There is history of rheumatoid arthritis in chart but connective tissue serologies are negative with no arthritis, joint pain symptoms.  Pulmonary fibrosis is felt to be more consistent with IPF and she has been on Esbriet since 2019  She was hospitalized in early February 2023 for dehydration, AKI and rapid atrial fibrillation.  Overall improved since discharge  Pets: Has a dog.  No cats, birds, farm animals Occupation: Retired Lawyer Exposures: Has feather pillows at home but not in her bedroom.  No mold, hot tub, Jacuzzi Smoking history: Never smoker Travel history: No significant travel history Relevant family history: No significant family history of lung disease  Interim history: Discussed the use of AI scribe software for clinical note transcription with the patient, who gave verbal consent to proceed.  She is currently on Esbriet for her pulmonary fibrosis and reports improvement in her condition since her last visit. She also reports intermittent use of her CPAP machine, but has been using it more consistently recently.  Additionally, she mentions a recent episode of atrial fibrillation, for which she has consulted her cardiologist.   Outpatient Encounter Medications as of 12/16/2022  Medication Sig   acetaminophen (TYLENOL) 325 MG tablet Take 650 mg by mouth every 6 (six) hours as needed for moderate pain.   albuterol (VENTOLIN HFA) 108 (90 Base) MCG/ACT inhaler Inhale 1 puff into the lungs every 6 (six) hours as needed.   ALPRAZolam  (XANAX) 0.25 MG tablet Take 1 tablet (0.25 mg total) by mouth 3 (three) times daily as needed.   amLODipine (NORVASC) 5 MG tablet Take 1 tablet (5 mg total) by mouth daily.   apixaban (ELIQUIS) 5 MG TABS tablet Take 1 tablet (5 mg total) by mouth 2 (two) times daily.   apixaban (ELIQUIS) 5 MG TABS tablet Take 1 tablet (5 mg total) by mouth 2 (two) times daily.   atorvastatin (LIPITOR) 80 MG tablet Take 1 tablet (80 mg total) by mouth every evening.   cyclobenzaprine (FLEXERIL) 10 MG tablet Take 1 tablet (10 mg total) by mouth 3 (three) times daily as needed for muscle spasms.   docusate sodium (COLACE) 100 MG capsule Take 1 capsule (100 mg total) by mouth 2 (two) times daily.   dronedarone (MULTAQ) 400 MG tablet Take 1 tablet (400 mg total) by mouth 2 (two) times daily with a meal.   ezetimibe (ZETIA) 10 MG tablet Take 1 tablet (10 mg total) by mouth daily.   hydrochlorothiazide (HYDRODIURIL) 25 MG tablet Take 1 tablet by mouth once daily   HYDROcodone-acetaminophen (NORCO) 10-325 MG tablet Take 1 tablet by mouth every 6 (six) hours as needed.   lactulose, encephalopathy, (ENULOSE) 10 GM/15ML SOLN take BY MOUTH three times daily AS NEEDED FOR mild constipation   linaclotide (LINZESS) 145 MCG CAPS capsule TAKE ONE CAPSULE BY MOUTH BEFORE BREAKFAST   losartan (COZAAR) 100 MG tablet Take 1 tablet (100 mg total) by mouth every evening.   ondansetron (ZOFRAN) 4 MG tablet Take 1 tablet (4 mg total) by mouth every 8 (eight)  hours as needed for nausea or vomiting.   Oxycodone HCl 10 MG TABS Take 10 mg by mouth 4 (four) times daily.   Pirfenidone 801 MG TABS Take 1 tablet (801 mg total) by mouth 3 (three) times daily.   No facility-administered encounter medications on file as of 12/16/2022.   Physical Exam: Blood pressure 133/69, pulse (!) 54, temperature (!) 97.1 F (36.2 C), temperature source Temporal, height 5' 5.5" (1.664 m), weight 174 lb 9.6 oz (79.2 kg), SpO2 98%. Gen:      No acute  distress HEENT:  EOMI, sclera anicteric Neck:     No masses; no thyromegaly Lungs:    Mild Bibasal crackles CV:         Regular rate and rhythm; no murmurs Abd:      + bowel sounds; soft, non-tender; no palpable masses, no distension Ext:    No edema; adequate peripheral perfusion Skin:      Warm and dry; no rash Neuro: alert and oriented x 3 Psych: normal mood and affect   Data Reviewed: Imaging: High-res CT 12/09/2018- mild pulmonary fibrosis and basilar predominance.  Traction bronchiectasis, honeycombing.  Unchanged compared to 2017  High-res CT 12/15/2019-unchanged UIP pulmonary fibrosis  High-res CT 10/24/2021-stable pattern of UIP pulmonary fibrosis I have reviewed all images personally  PFTs: 06/14/2017 FVC 2.21 [89%], FEV1 1.88 [98%],/55, TLC 3.68 [70%], DLCO 14.69 [57%]  10/15/2019 FVC 2.10 [83%), FEV1 1.42 [73%], F/F 68, TLC 3.77 70%], DLCO 16.51 [80%]  11/12/2021 FVC 2.25 [73%], FEV1 1.91 [82%], F/F 85, TLC 3.79 [70%], DLCO 16.71 [81%] Mild restriction  Labs: CTD serologies including CCP, rheumatoid factor 2017, 2018, 2019-negative Comprehensive metabolic panel 11/09/2018- normal.  Sleep study: 10/2016 PSG> AHI 10.5   CPAP download 12/19/2018-77% compliance.  Residual AHI 2  Cardiac: Echocardiogram 10/19/2017-LVEF 60 to 65%, grade 1 diastolic dysfunction, mild RV dilatation.  Assessment:  Pulmonary fibrosis Although she has history of rheumatoid arthritis in the chart she does not have any symptoms.  And has negative serologies.  Presentation is consistent with IPF which has been stable on CT and PFTs  Continue on pirfenidone Check hepatic panel today for monitoring  Evaluation for pulmonary hypertension Does not have lower extremity edema.  Last echocardiogram notes mild RV dilatation though no mention of increased pressures. Check proBNP  Obstructive sleep apnea Continue CPAP  Remote pulmonary embolism, afib Continue anticoagulation  Health  maintenance Has been vaccinated against Covid  Plan/Recommendations: - Continue Esbriet - Check labs for monitoring - High-res CT, PFTs in 6 months - Continue CPAP  Chilton Greathouse MD [facility] 12/16/2022, 10:59 AM  CC: Philip Aspen, [name]

## 2022-12-16 NOTE — Patient Instructions (Addendum)
During your visit, we discussed your concerns about the cost and access to your Eliquis medication for atrial fibrillation. We also reviewed your pulmonary fibrosis treatment with Esbriet and your intermittent use of the CPAP machine for obstructive sleep apnea. We discussed the possibility of switching your medication to Xarelto due to cost considerations.  YOUR PLAN:  -PULMONARY FIBROSIS: Pulmonary fibrosis is a lung disease that occurs when lung tissue becomes damaged and scarred. We will order liver function tests today due to your use of Esbriet. We will also schedule CT chest and pulmonary function tests in 6 months.  -OBSTRUCTIVE SLEEP APNEA: Obstructive sleep apnea is a sleep disorder in which breathing repeatedly stops and starts. We encourage you to use your CPAP machine consistently to manage this condition.  INSTRUCTIONS:  Please continue to take your current medications as prescribed. We will also order liver function tests today due to your use of Esbriet. Please ensure to use your CPAP machine consistently to manage your obstructive sleep apnea. We will schedule a follow-up appointment in 6 months.

## 2022-12-24 ENCOUNTER — Telehealth: Payer: Self-pay | Admitting: Cardiology

## 2022-12-24 DIAGNOSIS — I4891 Unspecified atrial fibrillation: Secondary | ICD-10-CM

## 2022-12-24 NOTE — Telephone Encounter (Signed)
Pt wants to know if we got the forms for her Eliquis or Xarelto. Please advise

## 2022-12-24 NOTE — Telephone Encounter (Signed)
Patient states she spoke with someone here at office that she was denied for Eliquis but could be approved for the Xarelto. She gave it to a friend of hers to fill it out and mail it to [place].  She is not sure if it was sent to [place].  She ask if it is not received, can she come in and someone help her fill it out.  She needs the medication but can't do the paperwork on her own.

## 2022-12-25 NOTE — Telephone Encounter (Signed)
Left detailed message for patient with XARELTO number to call and complete program application.  Also tried calling authorized contacts on file (daughter and son) but not answer.

## 2022-12-25 NOTE — Telephone Encounter (Signed)
Pt called back. I provided number for xalrelto assistance program. Pt verbalized understanding. No further questions at this time.

## 2022-12-25 NOTE — Telephone Encounter (Signed)
She should call 888-XARELTO [phone]) for help getting the Xarelto.  Please note this is not a free program.  She would get the Xarelto for $89 per month with some fees.  I am unsure about what paperwork she is referring to.

## 2022-12-28 DIAGNOSIS — M25512 Pain in left shoulder: Secondary | ICD-10-CM | POA: Diagnosis not present

## 2022-12-28 DIAGNOSIS — M25612 Stiffness of left shoulder, not elsewhere classified: Secondary | ICD-10-CM | POA: Diagnosis not present

## 2022-12-31 MED ORDER — RIVAROXABAN 20 MG PO TABS: 20.0000 mg | ORAL_TABLET | Freq: Every day | ORAL | 0 refills | Status: DC

## 2022-12-31 MED ORDER — APIXABAN 5 MG PO TABS: 5.0000 mg | ORAL_TABLET | Freq: Two times a day (BID) | ORAL | Status: DC

## 2022-12-31 NOTE — Telephone Encounter (Signed)
Patient walked into clinic for help with Xarelto and me application. Completed application online and sent Rx to [facility] in [place]. Was going to provide patient Xarelto samples however none in stock. Will give another 7 days of Eliquis. Advised that when Xarelto is delivered, she can complete the Eliquis and then start Xarelto in the evening on the next day. Patient voiced understanding.

## 2022-12-31 NOTE — Addendum Note (Signed)
Addended by: Cheree Ditto on: 12/31/2022 05:14 PM   Modules accepted: Orders

## 2023-01-06 NOTE — Progress Notes (Unsigned)
Cardiology Office Note:    Date:  01/07/2023   ID:  Miranda [name], DOB 1945-05-11, MRN [id number]  PCP:  Miranda [name], Miranda [name], Miranda [name]   [facility] Providers Cardiologist:  Miranda [name], Miranda [name] Electrophysiologist:  Miranda [name], Miranda [name]     Referring Miranda [name]: Miranda [name], [name]   Chief Complaint  Patient presents with   Atrial Fibrillation    History of Present Illness:    Miranda [name] is a 77 y.o. female with a hx of hypertension, hyperlipidemia, OSA on CPAP, DM 2, and atrial fibrillation.  Myoview in March 2016 showed EF 69%, normal perfusion and wall motion.  She was diagnosed with acute PE in February 2018 and was treated with 11-month course of Xarelto.  High-resolution CT in February 2018 revealed interstitial lung disease as well as aortic atherosclerosis and two-vessel CAD.  Echocardiogram on 05/02/2016 showed EF 60 to 65%, grade 1 DD, moderate LAE.  Repeat CTA of chest in July 2019 showed resolution of PE.  She had atrial fibrillation while getting ready for colonoscopy in July 2019.  She spontaneously converted in the emergency room.  She was restarted on Xarelto and metoprolol.  EF was normal on echocardiogram.  Myoview was normal.  She was hospitalized in January 2023 with chest pain shortness of breath and found to have acute renal failure and felt to be dehydrated.  She was given IV fluid.  Echocardiogram was normal.  She also had hypokalemia and hypocalcemia.  She is currently on Multaq to 400 mg twice a day for her Afib   She was  seen in the hospital  on 08/14/2022.  During that time she was being seen and evaluated for paroxysmal atrial fibrillation.   She was rate controlled.  Her heart rate was noted to be in the 30s-50s.  Her beta-blocker was discontinued.  She was continued on Multaq and apixaban.  She was also noted to have chest pressure.  This was not felt to be related to cardiac issues.  No ischemic changes were noted on EKG.  Troponins were negative.      She underwent reverse left shoulder arthroplasty 08/13/2022. Was seen by Dr Elberta Fortis in August and doing well.   She does note some shortness of breath if she walks a long way. Rare chest discomfort. Notes some palpitations at times when lying down. Only lasts a few minutes. Has not taken meds this am. Last couple of visits she had excellent BP control. She is switching from Eliquis to Xarelto for cost.    Past Medical History:  Diagnosis Date   Anemia    Anxiety    Arthritis    Asthma    Atrial fibrillation (HCC)    CHF (congestive heart failure) (HCC)    pt. unsure- but thinks she was hosp. for CHF- 2002   Chronic kidney disease    [facility]   Clotting disorder (HCC)    blood clots in lungs/PE pulmonary embolism   Colon polyps    Complication of anesthesia    states requires a lot med. to put her to sleep    DDD (degenerative disc disease) 09/17/2011   Depression    "sometimes "   DM (diabetes mellitus) (HCC)    Family history of anesthesia complication    Sister had difficulty waking up from anesthesia   GERD (gastroesophageal reflux disease)    Glaucoma    bilateral, pt. admits that she is noncompliant to eye gtts.    Hemorrhoids  Hyperlipidemia    Hypertension    had stress, echo- 2006 /w , Cardiac Cath, per pt. 2002, echo repeated 2012- wnl    IPF (idiopathic pulmonary fibrosis) (HCC)    Low back pain    Shortness of breath    Sleep apnea    uses c-pap- q night recently    Past Surgical History:  Procedure Laterality Date   ABDOMINAL HYSTERECTOMY     ectopic, fibroids   ANTERIOR CERVICAL DECOMP/DISCECTOMY FUSION N/A 09/11/2020   Procedure: Anterior Cervical Decompression Fusion Cervical four-five, Cervical five-six;  Surgeon: Bedelia Person, Miranda [name];  Location: [facility] OR;  Service: Neurosurgery;  Laterality: N/A;   arm surgery Right    CARDIAC CATHETERIZATION     CATARACT EXTRACTION, BILATERAL     cataracts removed bilateral- ?IOL   COLONOSCOPY      remote   FISSURECTOMY  10/08/2011   Procedure: FISSURECTOMY;  Surgeon: Almond Lint, Miranda [name];  Location: [facility] OR;  Service: General;  Laterality: N/A;   FLEXIBLE SIGMOIDOSCOPY  02/25/2011   Procedure: FLEXIBLE SIGMOIDOSCOPY;  Surgeon: Louis Meckel, Miranda [name];  Location: [facility] ENDOSCOPY;  Service: Endoscopy;  Laterality: N/A;   FOOT SURGERY     bilat, heel spurs- screw in R foot    HEMORRHOID SURGERY  10/08/2011   Procedure: HEMORRHOIDECTOMY;  Surgeon: Almond Lint, Miranda [name];  Location: [facility] OR;  Service: General;  Laterality: N/A;  External    REVERSE SHOULDER ARTHROPLASTY Left 08/13/2022   Procedure: REVERSE SHOULDER ARTHROPLASTY;  Surgeon: Francena Hanly, Miranda [name];  Location: [facility] ORS;  Service: Orthopedics;  Laterality: Left;    ROTATOR CUFF REPAIR Right    SPHINCTEROTOMY  10/08/2011   Procedure: SPHINCTEROTOMY;  Surgeon: Almond Lint, Miranda [name];  Location: [facility] OR;  Service: General;  Laterality: N/A;    Current Medications: Current Meds  Medication Sig   acetaminophen (TYLENOL) 325 MG tablet Take 650 mg by mouth every 6 (six) hours as needed for moderate pain.   albuterol (VENTOLIN HFA) 108 (90 Base) MCG/ACT inhaler Inhale 1 puff into the lungs every 6 (six) hours as needed.   ALPRAZolam (XANAX) 0.25 MG tablet Take 1 tablet (0.25 mg total) by mouth 3 (three) times daily as needed.   amLODipine (NORVASC) 5 MG tablet Take 1 tablet (5 mg total) by mouth daily.   apixaban (ELIQUIS) 5 MG TABS tablet Take 1 tablet (5 mg total) by mouth 2 (two) times daily.   atorvastatin (LIPITOR) 80 MG tablet Take 1 tablet (80 mg total) by mouth every evening.   cyclobenzaprine (FLEXERIL) 10 MG tablet Take 1 tablet (10 mg total) by mouth 3 (three) times daily as needed for muscle spasms.   docusate sodium (COLACE) 100 MG capsule Take 1 capsule (100 mg total) by mouth 2 (two) times daily.   dronedarone (MULTAQ) 400 MG tablet Take 1 tablet (400 mg total) by mouth 2 (two) times daily with a meal.   ezetimibe (ZETIA) 10 MG tablet Take 1  tablet (10 mg total) by mouth daily.   hydrochlorothiazide (HYDRODIURIL) 25 MG tablet Take 1 tablet by mouth once daily   HYDROcodone-acetaminophen (NORCO) 10-325 MG tablet Take 1 tablet by mouth every 6 (six) hours as needed.   lactulose, encephalopathy, (ENULOSE) 10 GM/15ML SOLN take BY MOUTH three times daily AS NEEDED FOR mild constipation   linaclotide (LINZESS) 145 MCG CAPS capsule TAKE ONE CAPSULE BY MOUTH BEFORE BREAKFAST   losartan (COZAAR) 100 MG tablet Take 1 tablet (100 mg total) by mouth every evening.   ondansetron (ZOFRAN) 4  MG tablet Take 1 tablet (4 mg total) by mouth every 8 (eight) hours as needed for nausea or vomiting.   Oxycodone HCl 10 MG TABS Take 10 mg by mouth 4 (four) times daily.   Pirfenidone 801 MG TABS Take 1 tablet (801 mg total) by mouth 3 (three) times daily.   rivaroxaban (XARELTO) 20 MG TABS tablet Take 1 tablet (20 mg total) by mouth daily with supper.     Allergies:   Fish oil, Other, Penicillins, Pneumococcal vaccines, Cashew nut oil, Nitrofurantoin macrocrystal, Peanut oil, Aspirin, Bactrim [sulfamethoxazole-trimethoprim], Ciprofloxacin, Ibuprofen, Influenza vaccines, Ivp dye [iodinated contrast media], Latex, Macrobid [nitrofurantoin monohydrate macrocrystals], and Shellfish allergy   Social History   Socioeconomic History   Marital status: Widowed    Spouse name: Not on file   Number of children: 4   Years of education: Not on file   Highest education level: Not on file  Occupational History   Occupation: retired Scientist, research (medical): UNEMPLOYED  Tobacco Use   Smoking status: Never    Passive exposure: Never   Smokeless tobacco: Never  Vaping Use   Vaping status: Never Used  Substance and Sexual Activity   Alcohol use: No    Alcohol/week: 0.0 standard drinks of alcohol   Drug use: No   Sexual activity: Never    Comment: hyst  Other Topics Concern   Not on file  Social History Narrative   Not on file   Social Determinants of Health    Financial Resource Strain: Low Risk  (01/09/2022)   Overall Financial Resource Strain (CARDIA)    Difficulty of Paying Living Expenses: Not very hard  Food Insecurity: No Food Insecurity (08/13/2022)   Hunger Vital Sign    Worried About Running Out of Food in the Last Year: Never true    Ran Out of Food in the Last Year: Never true  Transportation Needs: No Transportation Needs (08/13/2022)   PRAPARE - Administrator, Civil Service (Medical): No    Lack of Transportation (Non-Medical): No  Physical Activity: Not on file  Stress: Not on file  Social Connections: Not on file     Family History: The patient's family history includes Arthritis/Rheumatoid in her sister; Asthma in her sister; Breast cancer in her mother and sister; COPD in her sister; Colon cancer in her brother and brother; Emphysema in her sister; Heart attack in her mother; Heart disease in her mother; Lung cancer in her sister. There is no history of Anesthesia problems, Esophageal cancer, Rectal cancer, or Stomach cancer.  ROS:   Please see the history of present illness.     All other systems reviewed and are negative.  EKGs/Labs/Other Studies Reviewed:    The following studies were reviewed today:  Echo 04/14/2021  1. Left ventricular ejection fraction, by estimation, is 55 to 60%. The  left ventricle has normal function. The left ventricle has no regional  wall motion abnormalities. Left ventricular diastolic parameters are  consistent with Grade I diastolic  dysfunction (impaired relaxation).   2. Right ventricular systolic function is normal. The right ventricular  size is normal. There is normal pulmonary artery systolic pressure.   3. The mitral valve is normal in structure. No evidence of mitral valve  regurgitation. No evidence of mitral stenosis.   4. The aortic valve is normal in structure. Aortic valve regurgitation is  not visualized. Aortic valve sclerosis/calcification is present,  without  any evidence of aortic stenosis.   5. The inferior  vena cava is normal in size with greater than 50%  respiratory variability, suggesting right atrial pressure of 3 mmHg.   Comparison(s): A prior study was performed on 10/19/2017. No significant  change from prior study.   EKG:  EKG is not ordered today.   Recent Labs: 08/05/2022: Hemoglobin 12.8; Platelets 176 12/16/2022: ALT 22; BUN 13; Creatinine, Ser 0.87; Potassium 3.9; Pro B Natriuretic peptide (BNP) 229.0; Sodium 140  Recent Lipid Panel    Component Value Date/Time   CHOL 145 02/19/2022 1404   CHOL 167 11/26/2017 0908   TRIG 60.0 02/19/2022 1404   HDL 72.60 02/19/2022 1404   HDL 61 11/26/2017 0908   CHOLHDL 2 02/19/2022 1404   VLDL 12.0 02/19/2022 1404   LDLCALC 60 02/19/2022 1404   LDLCALC 90 12/01/2019 1025   LDLDIRECT 152.8 03/30/2011 0859     Risk Assessment/Calculations:    CHA2DS2-VASc Score = 6   This indicates a 9.7% annual risk of stroke. The patient's score is based upon: CHF History: 0 HTN History: 1 Diabetes History: 1 Stroke History: 0 Vascular Disease History: 1 Age Score: 2 Gender Score: 1           Physical Exam:    VS:  BP (!) 156/70 (BP Location: Right Arm, Cuff Size: Large)   Pulse (!) 53   Ht 5' 5.5" (1.664 m)   Wt 175 lb 6.4 oz (79.6 kg)   SpO2 98%   BMI 28.74 kg/m     Wt Readings from Last 3 Encounters:  01/07/23 175 lb 6.4 oz (79.6 kg)  12/16/22 174 lb 9.6 oz (79.2 kg)  12/09/22 178 lb (80.7 kg)     GEN:  Well nourished, well developed in no acute distress HEENT: Normal NECK: No JVD; No carotid bruits LYMPHATICS: No lymphadenopathy CARDIAC: RRR, no murmurs, rubs, gallops RESPIRATORY:  Clear to auscultation without rales, wheezing or rhonchi  ABDOMEN: Soft, non-tender, non-distended MUSCULOSKELETAL:  No edema; No deformity  SKIN: Warm and dry NEUROLOGIC:  Alert and oriented x 3 PSYCHIATRIC:  Normal affect   ASSESSMENT:    1. Paroxysmal atrial fibrillation  (HCC)   2. Secondary hypercoagulable state (HCC)   3. Essential hypertension   4. Coronary artery disease involving native coronary artery of native heart without angina pectoris      PLAN:    In order of problems listed above:  PAF: well controlled on Multaq. On Xarelto for anticoagulation.  Told her to be sure and take Multaq with food. Follow up in 6 months with me or APP  Hypertension: Blood pressure is elevated today but she has not yet taken her medication. At other visits BP has been well. Controlled. Miranda monitor on current therapy.  Hyperlipidemia: On Zetia. Last LDL 60  DM2: Managed by primary care provider  5.   Coronary artery calcification - no significant angina.            Medication Adjustments/Labs and Tests Ordered: Current medicines are reviewed at length with the patient today.  Concerns regarding medicines are outlined above.  No orders of the defined types were placed in this encounter.  No orders of the defined types were placed in this encounter.   There are no Patient Instructions on file for this visit.   Signed, Adeli Frost [name], Miranda [name]  01/07/2023 10:06 AM    [facility]

## 2023-01-07 ENCOUNTER — Encounter: Payer: Self-pay | Admitting: Cardiology

## 2023-01-07 ENCOUNTER — Ambulatory Visit: Payer: PPO | Attending: Cardiology | Admitting: Cardiology

## 2023-01-07 VITALS — BP 156/70 | HR 53 | Ht 65.5 in | Wt 175.4 lb

## 2023-01-07 DIAGNOSIS — I2510 Atherosclerotic heart disease of native coronary artery without angina pectoris: Secondary | ICD-10-CM

## 2023-01-07 DIAGNOSIS — I10 Essential (primary) hypertension: Secondary | ICD-10-CM | POA: Diagnosis not present

## 2023-01-07 DIAGNOSIS — I480 Paroxysmal atrial fibrillation: Secondary | ICD-10-CM

## 2023-01-07 DIAGNOSIS — D6869 Other thrombophilia: Secondary | ICD-10-CM

## 2023-01-07 NOTE — Patient Instructions (Signed)
Medication Instructions:  Continue same medications *If you need a refill on your cardiac medications before your next appointment, please call your pharmacy*   Lab Work: None ordered   Testing/Procedures: None ordered   Follow-Up: At [facility], you and your health needs are our priority.  As part of our continuing mission to provide you with exceptional heart care, we have created designated Provider Care Teams.  These Care Teams include your primary Cardiologist (physician) and Advanced Practice Providers (APPs -  Physician Assistants and Nurse Practitioners) who all work together to provide you with the care you need, when you need it.  We recommend signing up for the patient portal called "MyChart".  Sign up information is provided on this After Visit Summary.  MyChart is used to connect with patients for Virtual Visits (Telemedicine).  Patients are able to view lab/test results, encounter notes, upcoming appointments, etc.  Non-urgent messages can be sent to your provider as well.   To learn more about what you can do with MyChart, go to [email or web address].    Your next appointment:  6 months    Call in Jan to schedule April appointment     Provider:  Dr.Jordan

## 2023-01-08 ENCOUNTER — Other Ambulatory Visit: Payer: Self-pay | Admitting: Internal Medicine

## 2023-01-08 DIAGNOSIS — I10 Essential (primary) hypertension: Secondary | ICD-10-CM

## 2023-01-08 DIAGNOSIS — E785 Hyperlipidemia, unspecified: Secondary | ICD-10-CM

## 2023-01-08 DIAGNOSIS — F419 Anxiety disorder, unspecified: Secondary | ICD-10-CM

## 2023-01-13 DIAGNOSIS — G4733 Obstructive sleep apnea (adult) (pediatric): Secondary | ICD-10-CM | POA: Diagnosis not present

## 2023-01-18 ENCOUNTER — Other Ambulatory Visit: Payer: Self-pay | Admitting: Cardiology

## 2023-01-18 NOTE — Telephone Encounter (Signed)
Prescription refill request for Xarelto received.  Indication:afib Last office visit:10/24 Weight:79.6  kg Age:77 Scr:0.87  10/24 CrCl:69.13  ml/min  Prescription refilled

## 2023-02-03 DIAGNOSIS — Z96612 Presence of left artificial shoulder joint: Secondary | ICD-10-CM | POA: Diagnosis not present

## 2023-02-15 ENCOUNTER — Other Ambulatory Visit: Payer: Self-pay | Admitting: Internal Medicine

## 2023-02-15 DIAGNOSIS — I10 Essential (primary) hypertension: Secondary | ICD-10-CM

## 2023-02-15 DIAGNOSIS — F419 Anxiety disorder, unspecified: Secondary | ICD-10-CM

## 2023-02-17 NOTE — Telephone Encounter (Signed)
Pt called to F/U on this refill request (ALPRAZolam (XANAX) 0.25 MG tablets)   stating she is completely out, has had several deaths in the family over the holiday, and is not sleeping.  Please ok this refill with the pharmacy, as soon as possible.

## 2023-02-22 ENCOUNTER — Ambulatory Visit (INDEPENDENT_AMBULATORY_CARE_PROVIDER_SITE_OTHER): Payer: PPO | Admitting: Internal Medicine

## 2023-02-22 ENCOUNTER — Encounter: Payer: Self-pay | Admitting: Internal Medicine

## 2023-02-22 VITALS — BP 120/84 | HR 59 | Temp 97.6°F | Wt 183.0 lb

## 2023-02-22 DIAGNOSIS — E1169 Type 2 diabetes mellitus with other specified complication: Secondary | ICD-10-CM

## 2023-02-22 DIAGNOSIS — Z23 Encounter for immunization: Secondary | ICD-10-CM

## 2023-02-22 DIAGNOSIS — E785 Hyperlipidemia, unspecified: Secondary | ICD-10-CM | POA: Diagnosis not present

## 2023-02-22 DIAGNOSIS — K5900 Constipation, unspecified: Secondary | ICD-10-CM | POA: Diagnosis not present

## 2023-02-22 DIAGNOSIS — I10 Essential (primary) hypertension: Secondary | ICD-10-CM | POA: Diagnosis not present

## 2023-02-22 LAB — POCT GLYCOSYLATED HEMOGLOBIN (HGB A1C): Hemoglobin A1C: 6 % — AB (ref 4.0–5.6)

## 2023-02-22 NOTE — Addendum Note (Signed)
Addended by: Kern Reap B on: 02/22/2023 10:54 AM   Modules accepted: Orders

## 2023-02-22 NOTE — Progress Notes (Signed)
Established Patient Office Visit     CC/Reason for Visit: Follow-up chronic conditions, discuss constipation  HPI: Miranda [name] is a 77 y.o. female who is coming in today for the above mentioned reasons. Past Medical History is significant for: Hypertension, hyperlipidemia, type 2 diabetes, atrial fibrillation, idiopathic pulmonary fibrosis, OSA on CPAP.  She has been dealing with constipation for years.  It has gotten worse again.  She is having some escape edge of loose stool around will be assumed to be a large rectal stool burden.  She has been straining a lot to use the bathroom.  She admits that she does not drink enough fluids.  She is requesting flu vaccine.   Past Medical/Surgical History: Past Medical History:  Diagnosis Date   Anemia    Anxiety    Arthritis    Asthma    Atrial fibrillation (HCC)    CHF (congestive heart failure) (HCC)    pt. unsure- but thinks she was hosp. for CHF- 2002   [facility]   Clotting disorder (HCC)    blood clots in lungs/PE pulmonary embolism   Colon polyps    Complication of anesthesia    states requires a lot med. to put her to sleep    DDD (degenerative disc disease) 09/17/2011   Depression    "sometimes "   DM (diabetes mellitus) (HCC)    Family history of anesthesia complication    Sister had difficulty waking up from anesthesia   GERD (gastroesophageal reflux disease)    Glaucoma    bilateral, pt. admits that she is noncompliant to eye gtts.    Hemorrhoids    Hyperlipidemia    Hypertension    had stress, echo- 2006 /w [place], Cardiac Cath, per pt. 2002, echo repeated 2012- wnl    IPF (idiopathic pulmonary fibrosis) (HCC)    Low back pain    Shortness of breath    Sleep apnea    uses c-pap- q night recently    Past Surgical History:  Procedure Laterality Date   ABDOMINAL HYSTERECTOMY     ectopic, fibroids   ANTERIOR CERVICAL DECOMP/DISCECTOMY FUSION N/A 09/11/2020    Procedure: Anterior Cervical Decompression Fusion Cervical four-five, Cervical five-six;  Surgeon: Bedelia Person, MD;  Location[facility] OR;  Service: Neurosurgery;  Laterality: N/A;   arm surgery Right    CARDIAC CATHETERIZATION     CATARACT EXTRACTION, BILATERAL     cataracts removed bilateral- ?IOL   COLONOSCOPY     remote   FISSURECTOMY  10/08/2011   Procedure: FISSURECTOMY;  Surgeon: Almond Lint, MD;  Location: [facility] OR;  Service: General;  Laterality: N/A;   FLEXIBLE SIGMOIDOSCOPY  02/25/2011   Procedure: FLEXIBLE SIGMOIDOSCOPY;  Surgeon: Louis Meckel, MD;  Location: [facility] ENDOSCOPY;  Service: Endoscopy;  Laterality: N/A;   FOOT SURGERY     bilat, heel spurs- screw in R foot    HEMORRHOID SURGERY  10/08/2011   Procedure: HEMORRHOIDECTOMY;  Surgeon: Almond Lint, MD;  Location: [facility] OR;  Service: General;  Laterality: N/A;  External    REVERSE SHOULDER ARTHROPLASTY Left 08/13/2022   Procedure: REVERSE SHOULDER ARTHROPLASTY;  Surgeon: Francena Hanly, MD;  Location: [facility] ORS;  Service: Orthopedics;  Laterality: Left;    ROTATOR CUFF REPAIR Right    SPHINCTEROTOMY  10/08/2011   Procedure: SPHINCTEROTOMY;  Surgeon: Almond Lint, MD;  Location: [facility] OR;  Service: General;  Laterality: N/A;    Social History:  reports  that she has never smoked. She has never been exposed to tobacco smoke. She has never used smokeless tobacco. She reports that she does not drink alcohol and does not use drugs.  Allergies: Allergies  Allergen Reactions   Fish Oil Anaphylaxis   Other Hives, Shortness Of Breath and Swelling    Allergic to cashew nuts and peanut oil.   Penicillins Anaphylaxis, Hives and Swelling    Has patient had a PCN reaction causing immediate rash, facial/tongue/throat swelling, SOB or lightheadedness with hypotension: Face swelling and hives started first, then swelling of the throat  Has patient had a PCN reaction causing severe rash involving mucus membranes or skin necrosis: Yes   Has patient had a PCN reaction that required hospitalization: No  Has patient had a PCN reaction occurring within the last 10 years: Yes  If all of the above answers are "NO", then may proceed with Cephalospor   Pneumococcal Vaccines Nausea And Vomiting   Cashew Nut Oil    Nitrofurantoin Macrocrystal Hives   Peanut Oil    Aspirin Itching and Rash   Bactrim [Sulfamethoxazole-Trimethoprim] Hives, Itching and Rash   Ciprofloxacin Hives, Itching and Rash   Ibuprofen Rash   Influenza Vaccines Hives   Ivp Dye [Iodinated Contrast Media] Hives, Itching and Rash    Gives benadryl to counteract symptoms   Latex Rash   Macrobid [Nitrofurantoin Monohydrate Macrocrystals] Hives   Shellfish Allergy Hives    Patient also allergic to seafood    Family History:  Family History  Problem Relation Age of Onset   Heart attack Mother    Heart disease Mother    Breast cancer Mother    Emphysema Sister    Breast cancer Sister    Arthritis/Rheumatoid Sister    Asthma Sister    Lung cancer Sister    COPD Sister    Colon cancer Brother    Colon cancer Brother    Anesthesia problems Neg Hx    Esophageal cancer Neg Hx    Rectal cancer Neg Hx    Stomach cancer Neg Hx      Current Outpatient Medications:    acetaminophen (TYLENOL) 325 MG tablet, Take 650 mg by mouth every 6 (six) hours as needed for moderate pain., Disp: , Rfl:    albuterol (VENTOLIN HFA) 108 (90 Base) MCG/ACT inhaler, Inhale 1 puff into the lungs every 6 (six) hours as needed., Disp: 18 g, Rfl: 1   ALPRAZolam (XANAX) 0.25 MG tablet, Take 1 tablet by mouth three times daily as needed, Disp: 90 tablet, Rfl: 0   amLODipine (NORVASC) 5 MG tablet, Take 1 tablet by mouth once daily, Disp: 90 tablet, Rfl: 0   apixaban (ELIQUIS) 5 MG TABS tablet, Take 1 tablet (5 mg total) by mouth 2 (two) times daily., Disp: , Rfl:    atorvastatin (LIPITOR) 80 MG tablet, Take 1 tablet by mouth in the evening, Disp: 90 tablet, Rfl: 0   cyclobenzaprine  (FLEXERIL) 10 MG tablet, Take 1 tablet (10 mg total) by mouth 3 (three) times daily as needed for muscle spasms., Disp: 30 tablet, Rfl: 1   docusate sodium (COLACE) 100 MG capsule, Take 1 capsule (100 mg total) by mouth 2 (two) times daily., Disp: 60 capsule, Rfl: 2   dronedarone (MULTAQ) 400 MG tablet, Take 1 tablet (400 mg total) by mouth 2 (two) times daily with a meal., Disp: 48 tablet, Rfl: 0   ezetimibe (ZETIA) 10 MG tablet, Take 1 tablet by mouth once daily, Disp: 90 tablet,  Rfl: 0   hydrochlorothiazide (HYDRODIURIL) 25 MG tablet, Take 1 tablet by mouth once daily, Disp: 90 tablet, Rfl: 0   HYDROcodone-acetaminophen (NORCO) 10-325 MG tablet, Take 1 tablet by mouth every 6 (six) hours as needed., Disp: 30 tablet, Rfl: 0   lactulose, encephalopathy, (ENULOSE) 10 GM/15ML SOLN, take BY MOUTH three times daily AS NEEDED FOR mild constipation, Disp: 473 mL, Rfl: 2   linaclotide (LINZESS) 145 MCG CAPS capsule, TAKE ONE CAPSULE BY MOUTH BEFORE BREAKFAST, Disp: 30 capsule, Rfl: 2   losartan (COZAAR) 100 MG tablet, Take 1 tablet by mouth in the evening, Disp: 90 tablet, Rfl: 0   ondansetron (ZOFRAN) 4 MG tablet, Take 1 tablet (4 mg total) by mouth every 8 (eight) hours as needed for nausea or vomiting., Disp: 10 tablet, Rfl: 0   Oxycodone HCl 10 MG TABS, Take 10 mg by mouth 4 (four) times daily., Disp: , Rfl:    Pirfenidone 801 MG TABS, Take 1 tablet (801 mg total) by mouth 3 (three) times daily., Disp: 270 tablet, Rfl: 1   rivaroxaban (XARELTO) 20 MG TABS tablet, TAKE 1 TABLET BY MOUTH EVERY DAY WITH SUPPER, Disp: 30 tablet, Rfl: 5  Review of Systems:  Negative unless indicated in HPI.   Physical Exam: Vitals:   02/22/23 0952  BP: 120/84  Pulse: (!) 59  Temp: 97.6 F (36.4 C)  TempSrc: Oral  SpO2: 100%  Weight: 183 lb (83 kg)    Body mass index is 29.99 kg/m.   Physical Exam Vitals reviewed.  Constitutional:      Appearance: Normal appearance.  HENT:     Head: Normocephalic  and atraumatic.  Eyes:     Conjunctiva/sclera: Conjunctivae normal.     Pupils: Pupils are equal, round, and reactive to light.  Cardiovascular:     Rate and Rhythm: Normal rate and regular rhythm.  Pulmonary:     Effort: Pulmonary effort is normal.     Breath sounds: Normal breath sounds.  Skin:    General: Skin is warm and dry.  Neurological:     General: No focal deficit present.     Mental Status: She is alert and oriented to person, place, and time.  Psychiatric:        Mood and Affect: Mood normal.        Behavior: Behavior normal.        Thought Content: Thought content normal.        Judgment: Judgment normal.      Impression and Plan:  Type 2 diabetes mellitus with other specified complication, without long-term current use of insulin (HCC) -     POCT glycosylated hemoglobin (Hb A1C) -     Microalbumin / creatinine urine ratio  Primary hypertension  Dyslipidemia  Immunization due  Constipation, unspecified constipation type   -Advised to increase fluid consumption, MiraLAX and Colace twice daily and she will follow-up with me in regards to constipation if no improvement. -A1c of 6.0 demonstrates excellent diabetic control. -Blood pressure is well-controlled. -Flu vaccine administered in office today.  Time spent:32 minutes reviewing chart, interviewing and examining patient and formulating plan of care.     Miranda Jan, MD [facility]

## 2023-02-22 NOTE — Patient Instructions (Signed)
-  Nice seeing you today!!  -Start miralax 17 gr twice daily and colace 1 tablet twice daily. These are over the counter.

## 2023-02-26 NOTE — Addendum Note (Signed)
Addended by: Gertie Baron D on: 02/26/2023 01:38 PM   Modules accepted: Orders

## 2023-03-12 ENCOUNTER — Telehealth: Payer: Self-pay

## 2023-03-12 NOTE — Telephone Encounter (Signed)
Called pt to inform her that her medication multaq 400 mg tablets from pt assistance program was at the office from 10/2022, waiting for her to pick up. Pt verbalized understanding and stated that she did not know that the medication was here. Lot#   [id number]  Exp: 03/2025

## 2023-03-15 ENCOUNTER — Other Ambulatory Visit: Payer: Self-pay | Admitting: Internal Medicine

## 2023-03-15 DIAGNOSIS — I10 Essential (primary) hypertension: Secondary | ICD-10-CM

## 2023-03-16 DIAGNOSIS — M5416 Radiculopathy, lumbar region: Secondary | ICD-10-CM | POA: Diagnosis not present

## 2023-03-18 ENCOUNTER — Telehealth: Payer: Self-pay | Admitting: *Deleted

## 2023-03-18 DIAGNOSIS — I10 Essential (primary) hypertension: Secondary | ICD-10-CM

## 2023-03-18 MED ORDER — HYDROCHLOROTHIAZIDE 25 MG PO TABS: 25.0000 mg | ORAL_TABLET | Freq: Every day | ORAL | 0 refills | Status: DC

## 2023-03-18 NOTE — Telephone Encounter (Signed)
 Copied from CRM [id number]. Topic: Clinical - Medication Refill >> Mar 16, 2023  2:38 PM Victoria A wrote: Most Recent Primary Care Visit:  Provider: THEOPHILUS DELMA TULLY CINDERELLA  Department: [facility]  Visit Type: OFFICE VISIT  Date: 02/22/2023  Medication: hydrochlorothiazide  (HYDRODIURIL ) 25 MG tablet Patient states that she called in last week to have medication refill but she is currently at the pharmacy and medication is not available  Has the patient contacted their pharmacy? Yes (Agent: If no, request that the patient contact the pharmacy for the refill. If patient does not wish to contact the pharmacy document the reason why and proceed with request.) (Agent: If yes, when and what did the pharmacy advise?)  Is this the correct pharmacy for this prescription? Yes If no, delete pharmacy and type the correct one.  This is the patient's preferred pharmacy:  [facility] [address] ([address]), [place] - [address] [place] ([place]) [place] [address] Phone: [phone] Fax: [phone]  [facility] [address], [place] - [address] [place] [address] Phone: [phone] Fax: [phone]   Has the prescription been filled recently? No  Is the patient out of the medication? Yes  Has the patient been seen for an appointment in the last year OR does the patient have an upcoming appointment? Yes  Can we respond through MyChart? No  Agent: Please be advised that Rx refills may take up to 3 business days. We ask that you follow-up with your pharmacy.

## 2023-03-18 NOTE — Telephone Encounter (Signed)
 Copied from CRM [id number]. Topic: Clinical - Medication Refill >> Mar 16, 2023 12:15 PM Macario HERO wrote: Most Recent Primary Care Visit:  Provider: THEOPHILUS DELMA TULLY CINDERELLA  Department: [facility]  Visit Type: OFFICE VISIT  Date: 02/22/2023  Medication: hydrochlorothiazide  (HYDRODIURIL ) 25 MG tablet [[id number]]  Has the patient contacted their pharmacy? Yes (Agent: If no, request that the patient contact the pharmacy for the refill. If patient does not wish to contact the pharmacy document the reason why and proceed with request.) (Agent: If yes, when and what did the pharmacy advise?)  Is this the correct pharmacy for this prescription? Yes If no, delete pharmacy and type the correct one.  This is the patient's preferred pharmacy:  [facility] [address] ([address] [place] - [address] [place] ([place]) [place] [address] Phone: [phone] Fax: [phone]    Has the prescription been filled recently? Yes  Is the patient out of the medication? Yes  Has the patient been seen for an appointment in the last year OR does the patient have an upcoming appointment? Yes  Can we respond through MyChart? No  Agent: Please be advised that Rx refills may take up to 3 business days. We ask that you follow-up with your pharmacy.

## 2023-03-26 ENCOUNTER — Other Ambulatory Visit: Payer: Self-pay | Admitting: Internal Medicine

## 2023-03-26 DIAGNOSIS — F419 Anxiety disorder, unspecified: Secondary | ICD-10-CM

## 2023-03-31 DIAGNOSIS — M51362 Other intervertebral disc degeneration, lumbar region with discogenic back pain and lower extremity pain: Secondary | ICD-10-CM | POA: Diagnosis not present

## 2023-03-31 DIAGNOSIS — Z5181 Encounter for therapeutic drug level monitoring: Secondary | ICD-10-CM | POA: Diagnosis not present

## 2023-03-31 DIAGNOSIS — M5416 Radiculopathy, lumbar region: Secondary | ICD-10-CM | POA: Diagnosis not present

## 2023-03-31 DIAGNOSIS — Z79899 Other long term (current) drug therapy: Secondary | ICD-10-CM | POA: Diagnosis not present

## 2023-03-31 DIAGNOSIS — M5412 Radiculopathy, cervical region: Secondary | ICD-10-CM | POA: Diagnosis not present

## 2023-03-31 DIAGNOSIS — T402X5A Adverse effect of other opioids, initial encounter: Secondary | ICD-10-CM | POA: Diagnosis not present

## 2023-04-01 ENCOUNTER — Other Ambulatory Visit: Payer: Self-pay | Admitting: Pharmacist

## 2023-04-01 DIAGNOSIS — J84112 Idiopathic pulmonary fibrosis: Secondary | ICD-10-CM

## 2023-04-01 MED ORDER — PIRFENIDONE 801 MG PO TABS: 1.0000 | ORAL_TABLET | Freq: Three times a day (TID) | ORAL | 1 refills | Status: DC

## 2023-04-01 NOTE — Telephone Encounter (Signed)
Refill sent for ESBRIET to [facility]) for Esbriet: [phone]  Dose: 801mg  three times daily  Last OV: 12/16/2022 Provider: Dr. Isaiah Serge Pertinent labs: LFTs on 12/16/2022 wnl  Next OV: not scheduled; due in April 2025  Routing to scheduling team for follow-up on appt scheduling  Chesley Mires, PharmD, MPH, BCPS Clinical Pharmacist (Rheumatology and Pulmonology)

## 2023-04-07 ENCOUNTER — Other Ambulatory Visit: Payer: Self-pay | Admitting: Internal Medicine

## 2023-04-13 DIAGNOSIS — M79672 Pain in left foot: Secondary | ICD-10-CM | POA: Insufficient documentation

## 2023-04-15 DIAGNOSIS — M25572 Pain in left ankle and joints of left foot: Secondary | ICD-10-CM | POA: Insufficient documentation

## 2023-04-15 DIAGNOSIS — M7672 Peroneal tendinitis, left leg: Secondary | ICD-10-CM | POA: Diagnosis not present

## 2023-04-15 DIAGNOSIS — M79672 Pain in left foot: Secondary | ICD-10-CM | POA: Diagnosis not present

## 2023-04-22 ENCOUNTER — Ambulatory Visit (HOSPITAL_COMMUNITY)
Admission: RE | Admit: 2023-04-22 | Discharge: 2023-04-22 | Disposition: A | Discharge status: Home / Self Care | Payer: PPO | Source: Ambulatory Visit | Attending: Internal Medicine | Admitting: Internal Medicine

## 2023-04-22 ENCOUNTER — Encounter (HOSPITAL_COMMUNITY): Payer: Self-pay | Admitting: Internal Medicine

## 2023-04-22 VITALS — BP 130/70 | HR 43 | Ht 65.5 in | Wt 175.8 lb

## 2023-04-22 DIAGNOSIS — Z5181 Encounter for therapeutic drug level monitoring: Secondary | ICD-10-CM | POA: Diagnosis not present

## 2023-04-22 DIAGNOSIS — I480 Paroxysmal atrial fibrillation: Secondary | ICD-10-CM | POA: Diagnosis not present

## 2023-04-22 DIAGNOSIS — E119 Type 2 diabetes mellitus without complications: Secondary | ICD-10-CM | POA: Diagnosis not present

## 2023-04-22 DIAGNOSIS — Z79899 Other long term (current) drug therapy: Secondary | ICD-10-CM | POA: Insufficient documentation

## 2023-04-22 DIAGNOSIS — I2510 Atherosclerotic heart disease of native coronary artery without angina pectoris: Secondary | ICD-10-CM | POA: Diagnosis not present

## 2023-04-22 DIAGNOSIS — Z7901 Long term (current) use of anticoagulants: Secondary | ICD-10-CM | POA: Insufficient documentation

## 2023-04-22 DIAGNOSIS — I10 Essential (primary) hypertension: Secondary | ICD-10-CM | POA: Insufficient documentation

## 2023-04-22 DIAGNOSIS — D6869 Other thrombophilia: Secondary | ICD-10-CM | POA: Diagnosis not present

## 2023-04-22 NOTE — Progress Notes (Addendum)
 Primary Care Physician: Theophilus Andrews, Tully GRADE, MD Primary Cardiologist: Peter Jordan, MD Electrophysiologist: Will Gladis Norton, MD     Referring Physician: Dr. Norton Hurtis [name] Miranda is a 78 y.o. female with a history of HTN, T2DM, CAD, and paroxsymal atrial fibrillation who presents for consultation in the [facility].  She is on Multaq  for rhythm control. Patient is on Eliquis  5 mg BID for a CHADS2VASC score of 6.  On evaluation today, she is currently in NSR. She is here today for Multaq  surveillance. She has had 1 episode of Afib since last office visit with Dr. Norton. It was brief and she was not overly symptomatic. She notes intermittently her HR will be in the 40s and she can feel tired from it but most times when she checks her HR it is in the 50s. No missed doses of Multaq  or Eliquis .   Today, she denies symptoms of chest pain, shortness of breath, orthopnea, PND, lower extremity edema, dizziness, presyncope, syncope, snoring, daytime somnolence, bleeding, or neurologic sequela. The patient is tolerating medications without difficulties and is otherwise without complaint today.   she has a BMI of Body mass index is 28.81 kg/m.SABRA Filed Weights   04/22/23 1043  Weight: 79.7 kg    Current Outpatient Medications  Medication Sig Dispense Refill   acetaminophen  (TYLENOL ) 325 MG tablet Take 650 mg by mouth every 6 (six) hours as needed for moderate pain.     albuterol  (VENTOLIN  HFA) 108 (90 Base) MCG/ACT inhaler Inhale 1 puff into the lungs every 6 (six) hours as needed. 18 g 1   ALPRAZolam  (XANAX ) 0.25 MG tablet Take 1 tablet by mouth three times daily as needed 90 tablet 0   amLODipine  (NORVASC ) 5 MG tablet Take 1 tablet by mouth once daily 90 tablet 0   atorvastatin  (LIPITOR ) 80 MG tablet Take 1 tablet by mouth in the evening 90 tablet 0   cyclobenzaprine  (FLEXERIL ) 10 MG tablet Take 1 tablet (10 mg total) by mouth 3 (three) times  daily as needed for muscle spasms. 30 tablet 1   docusate sodium  (COLACE) 100 MG capsule Take 1 capsule (100 mg total) by mouth 2 (two) times daily. 60 capsule 2   dronedarone  (MULTAQ ) 400 MG tablet Take 1 tablet (400 mg total) by mouth 2 (two) times daily with a meal. 48 tablet 0   ezetimibe  (ZETIA ) 10 MG tablet Take 1 tablet by mouth once daily 90 tablet 1   hydrochlorothiazide  (HYDRODIURIL ) 25 MG tablet Take 1 tablet by mouth once daily 90 tablet 0   hydrochlorothiazide  (HYDRODIURIL ) 25 MG tablet Take 1 tablet (25 mg total) by mouth daily. 90 tablet 0   HYDROcodone -acetaminophen  (NORCO) 10-325 MG tablet Take 1 tablet by mouth every 6 (six) hours as needed. 30 tablet 0   lactulose , encephalopathy, (ENULOSE ) 10 GM/15ML SOLN take 30mLs BY MOUTH three times daily AS NEEDED FOR mild constipation 473 mL 2   linaclotide  (LINZESS ) 145 MCG CAPS capsule TAKE ONE CAPSULE BY MOUTH BEFORE BREAKFAST 30 capsule 2   losartan  (COZAAR ) 100 MG tablet Take 1 tablet by mouth in the evening 90 tablet 0   ondansetron  (ZOFRAN ) 4 MG tablet Take 1 tablet (4 mg total) by mouth every 8 (eight) hours as needed for nausea or vomiting. 10 tablet 0   Oxycodone  HCl 10 MG TABS Take 10 mg by mouth 4 (four) times daily.     Pirfenidone  801 MG TABS Take 1 tablet (  801 mg total) by mouth 3 (three) times daily. 270 tablet 1   rivaroxaban  (XARELTO ) 20 MG TABS tablet TAKE 1 TABLET BY MOUTH EVERY DAY WITH SUPPER 30 tablet 5   No current facility-administered medications for this encounter.    Atrial Fibrillation Management history:  Previous antiarrhythmic drugs: Multaq  Previous cardioversions: none Previous ablations: none Anticoagulation history: Eliquis    ROS- All systems are reviewed and negative except as per the HPI above.  Physical Exam: BP 130/70   Pulse (!) 43   Ht 5' 5.5 (1.664 m)   Wt 79.7 kg   BMI 28.81 kg/m   GEN: Well nourished, well developed in no acute distress NECK: No JVD; No carotid  bruits CARDIAC: Regular bradycardic rate and rhythm, no murmurs, rubs, gallops RESPIRATORY:  Clear to auscultation without rales, wheezing or rhonchi  ABDOMEN: Soft, non-tender, non-distended EXTREMITIES:  No edema; No deformity   EKG today demonstrates  Vent. rate 43 BPM PR interval 146 ms QRS duration 72 ms QT/QTcB 482/407 ms P-R-T axes 54 10 72 Marked sinus bradycardia Abnormal ECG When compared with ECG of 20-Oct-2022 09:57, PREVIOUS ECG IS PRESENT  Echo 04/14/21 demonstrated  1. Left ventricular ejection fraction, by estimation, is 55 to 60%. The  left ventricle has normal function. The left ventricle has no regional  wall motion abnormalities. Left ventricular diastolic parameters are  consistent with Grade I diastolic  dysfunction (impaired relaxation).   2. Right ventricular systolic function is normal. The right ventricular  size is normal. There is normal pulmonary artery systolic pressure.   3. The mitral valve is normal in structure. No evidence of mitral valve  regurgitation. No evidence of mitral stenosis.   4. The aortic valve is normal in structure. Aortic valve regurgitation is  not visualized. Aortic valve sclerosis/calcification is present, without  any evidence of aortic stenosis.   5. The inferior vena cava is normal in size with greater than 50%  respiratory variability, suggesting right atrial pressure of 3 mmHg.    ASSESSMENT & PLAN CHA2DS2-VASc Score = 6  The patient's score is based upon: CHF History: 0 HTN History: 1 Diabetes History: 1 Stroke History: 0 Vascular Disease History: 1 Age Score: 2 Gender Score: 1       ASSESSMENT AND PLAN: Paroxysmal Atrial Fibrillation (ICD10:  I48.0) The patient's CHA2DS2-VASc score is 6, indicating a 9.7% annual risk of stroke.    She is currently in NSR. Can consider monitor in the future if she notes her HR are consistently in the 40s or lower. She notes her HR most of the times stays in the 50s.    Secondary Hypercoagulable State (ICD10:  D68.69) The patient is at significant risk for stroke/thromboembolism based upon her CHA2DS2-VASc Score of 6.  Continue Rivaroxaban  (Xarelto ).  No missed doses.   High risk medication monitoring (ICD10: U5195107) Patient requires ongoing monitoring for anti-arrhythmic medication which has the potential to cause life threatening arrhythmias or AV block. Intervals are stable. Continue Multaq  400 mg BID. She remains happy with her rhythm control with Multaq .      Follow up 6 months with Dr. Inocencio.    Terra Pac, PA-C  Afib Clinic [facility] [address] [place], [place] [address] [phone]

## 2023-05-05 ENCOUNTER — Ambulatory Visit: Payer: PPO | Admitting: Internal Medicine

## 2023-05-11 ENCOUNTER — Encounter: Payer: Self-pay | Admitting: Internal Medicine

## 2023-05-11 ENCOUNTER — Ambulatory Visit (INDEPENDENT_AMBULATORY_CARE_PROVIDER_SITE_OTHER): Payer: PPO | Admitting: Internal Medicine

## 2023-05-11 VITALS — BP 110/80 | HR 53 | Temp 97.6°F | Wt 177.7 lb

## 2023-05-11 DIAGNOSIS — K5900 Constipation, unspecified: Secondary | ICD-10-CM | POA: Diagnosis not present

## 2023-05-11 DIAGNOSIS — K644 Residual hemorrhoidal skin tags: Secondary | ICD-10-CM | POA: Diagnosis not present

## 2023-05-11 DIAGNOSIS — E785 Hyperlipidemia, unspecified: Secondary | ICD-10-CM | POA: Diagnosis not present

## 2023-05-11 DIAGNOSIS — G8929 Other chronic pain: Secondary | ICD-10-CM

## 2023-05-11 DIAGNOSIS — N814 Uterovaginal prolapse, unspecified: Secondary | ICD-10-CM | POA: Diagnosis not present

## 2023-05-11 DIAGNOSIS — M545 Low back pain, unspecified: Secondary | ICD-10-CM | POA: Diagnosis not present

## 2023-05-11 MED ORDER — ATORVASTATIN CALCIUM 80 MG PO TABS: 80.0000 mg | ORAL_TABLET | Freq: Every evening | ORAL | 0 refills | Status: DC

## 2023-05-11 MED ORDER — LACTULOSE ENCEPHALOPATHY 10 GM/15ML PO SOLN: ORAL | 2 refills | Status: DC

## 2023-05-11 MED ORDER — CYCLOBENZAPRINE HCL 10 MG PO TABS
10.0000 mg | ORAL_TABLET | Freq: Three times a day (TID) | ORAL | 1 refills | Status: DC | PRN
Start: 2023-05-11 — End: 2023-09-08

## 2023-05-11 MED ORDER — HYDROCORTISONE (PERIANAL) 2.5 % EX CREA: 1.0000 | TOPICAL_CREAM | Freq: Two times a day (BID) | CUTANEOUS | 2 refills | Status: DC

## 2023-05-11 NOTE — Progress Notes (Signed)
 Established Patient Office Visit     CC/Reason for Visit: Discuss acute concerns  HPI: Miranda [name] is a 78 y.o. female who is coming in today for the above mentioned reasons. Here to discuss 2 main issues:  -Hemorrhoid pain is getting worse. She did run out of lactulose and dulcolax recently and has been more constipated (chronic issue for her). -She believes she has a uterine prolapse, has h/o cystocele. She feels at times tissue protruding from her vagina and pressure when urinating, chronic urinary incontinence.  She is also requesting chronic medication refills.   Past Medical/Surgical History: Past Medical History:  Diagnosis Date   Anemia    Anxiety    Arthritis    Asthma    Atrial fibrillation (HCC)    CHF (congestive heart failure) (HCC)    pt. unsure- but thinks she was hosp. for CHF- 2002   Chronic kidney disease    [facility]   Clotting disorder (HCC)    blood clots in lungs/PE pulmonary embolism   Colon polyps    Complication of anesthesia    states requires a lot med. to put her to sleep    DDD (degenerative disc disease) 09/17/2011   Depression    "sometimes "   DM (diabetes mellitus) (HCC)    Family history of anesthesia complication    Sister had difficulty waking up from anesthesia   GERD (gastroesophageal reflux disease)    Glaucoma    bilateral, pt. admits that she is noncompliant to eye gtts.    Hemorrhoids    Hyperlipidemia    Hypertension    had stress, echo- 2006 /w [place], Cardiac Cath, per pt. 2002, echo repeated 2012- wnl    IPF (idiopathic pulmonary fibrosis) (HCC)    Low back pain    Shortness of breath    Sleep apnea    uses c-pap- q night recently    Past Surgical History:  Procedure Laterality Date   ABDOMINAL HYSTERECTOMY     ectopic, fibroids   ANTERIOR CERVICAL DECOMP/DISCECTOMY FUSION N/A 09/11/2020   Procedure: Anterior Cervical Decompression Fusion Cervical four-five, Cervical five-six;  Surgeon:  Bedelia Person, MD;  Location: [facility] OR;  Service: Neurosurgery;  Laterality: N/A;   arm surgery Right    CARDIAC CATHETERIZATION     CATARACT EXTRACTION, BILATERAL     cataracts removed bilateral- ?IOL   COLONOSCOPY     remote   FISSURECTOMY  10/08/2011   Procedure: FISSURECTOMY;  Surgeon: Almond Lint, MD;  Location: [facility] OR;  Service: General;  Laterality: N/A;   FLEXIBLE SIGMOIDOSCOPY  02/25/2011   Procedure: FLEXIBLE SIGMOIDOSCOPY;  Surgeon: Louis Meckel, MD;  Location: [facility] ENDOSCOPY;  Service: Endoscopy;  Laterality: N/A;   FOOT SURGERY     bilat, heel spurs- screw in R foot    HEMORRHOID SURGERY  10/08/2011   Procedure: HEMORRHOIDECTOMY;  Surgeon: Almond Lint, MD;  Location: [facility] OR;  Service: General;  Laterality: N/A;  External    REVERSE SHOULDER ARTHROPLASTY Left 08/13/2022   Procedure: REVERSE SHOULDER ARTHROPLASTY;  Surgeon: Francena Hanly, MD;  Location: [facility] ORS;  Service: Orthopedics;  Laterality: Left;    ROTATOR CUFF REPAIR Right    SPHINCTEROTOMY  10/08/2011   Procedure: SPHINCTEROTOMY;  Surgeon: Almond Lint, MD;  Location: [facility] OR;  Service: General;  Laterality: N/A;    Social History:  reports that she has never smoked. She has never been exposed to tobacco smoke. She has never used smokeless tobacco. She  reports that she does not drink alcohol and does not use drugs.  Allergies: Allergies  Allergen Reactions   Fish Oil Anaphylaxis   Other Hives, Shortness Of Breath and Swelling    Allergic to cashew nuts and peanut oil.   Penicillins Anaphylaxis, Hives and Swelling    Has patient had a PCN reaction causing immediate rash, facial/tongue/throat swelling, SOB or lightheadedness with hypotension: Face swelling and hives started first, then swelling of the throat  Has patient had a PCN reaction causing severe rash involving mucus membranes or skin necrosis: Yes  Has patient had a PCN reaction that required hospitalization: No  Has patient had a PCN reaction  occurring within the last 10 years: Yes  If all of the above answers are "NO", then may proceed with Cephalospor   Pneumococcal Vaccines Nausea And Vomiting   Cashew Nut Oil    Nitrofurantoin Macrocrystal Hives   Peanut Oil    Aspirin Itching and Rash   Bactrim [Sulfamethoxazole-Trimethoprim] Hives, Itching and Rash   Ciprofloxacin Hives, Itching and Rash   Ibuprofen Rash   Influenza Vaccines Hives   Ivp Dye [Iodinated Contrast Media] Hives, Itching and Rash    Gives benadryl to counteract symptoms   Latex Rash   Macrobid [Nitrofurantoin Monohydrate Macrocrystals] Hives   Shellfish Allergy Hives    Patient also allergic to seafood    Family History:  Family History  Problem Relation Age of Onset   Heart attack Mother    Heart disease Mother    Breast cancer Mother    Emphysema Sister    Breast cancer Sister    Arthritis/Rheumatoid Sister    Asthma Sister    Lung cancer Sister    COPD Sister    Colon cancer Brother    Colon cancer Brother    Anesthesia problems Neg Hx    Esophageal cancer Neg Hx    Rectal cancer Neg Hx    Stomach cancer Neg Hx      Current Outpatient Medications:    acetaminophen (TYLENOL) 325 MG tablet, Take 650 mg by mouth every 6 (six) hours as needed for moderate pain., Disp: , Rfl:    albuterol (VENTOLIN HFA) 108 (90 Base) MCG/ACT inhaler, Inhale 1 puff into the lungs every 6 (six) hours as needed., Disp: 18 g, Rfl: 1   ALPRAZolam (XANAX) 0.25 MG tablet, Take 1 tablet by mouth three times daily as needed, Disp: 90 tablet, Rfl: 0   amLODipine (NORVASC) 5 MG tablet, Take 1 tablet by mouth once daily, Disp: 90 tablet, Rfl: 0   docusate sodium (COLACE) 100 MG capsule, Take 1 capsule (100 mg total) by mouth 2 (two) times daily., Disp: 60 capsule, Rfl: 2   dronedarone (MULTAQ) 400 MG tablet, Take 1 tablet (400 mg total) by mouth 2 (two) times daily with a meal., Disp: 48 tablet, Rfl: 0   ezetimibe (ZETIA) 10 MG tablet, Take 1 tablet by mouth once daily,  Disp: 90 tablet, Rfl: 1   hydrochlorothiazide (HYDRODIURIL) 25 MG tablet, Take 1 tablet by mouth once daily, Disp: 90 tablet, Rfl: 0   hydrochlorothiazide (HYDRODIURIL) 25 MG tablet, Take 1 tablet (25 mg total) by mouth daily., Disp: 90 tablet, Rfl: 0   HYDROcodone-acetaminophen (NORCO) 10-325 MG tablet, Take 1 tablet by mouth every 6 (six) hours as needed., Disp: 30 tablet, Rfl: 0   hydrocortisone (ANUSOL-HC) 2.5 % rectal cream, Place 1 Application rectally 2 (two) times daily., Disp: 30 g, Rfl: 2   linaclotide (LINZESS) 145 MCG CAPS  capsule, TAKE ONE CAPSULE BY MOUTH BEFORE BREAKFAST, Disp: 30 capsule, Rfl: 2   losartan (COZAAR) 100 MG tablet, Take 1 tablet by mouth in the evening, Disp: 90 tablet, Rfl: 0   ondansetron (ZOFRAN) 4 MG tablet, Take 1 tablet (4 mg total) by mouth every 8 (eight) hours as needed for nausea or vomiting., Disp: 10 tablet, Rfl: 0   Oxycodone HCl 10 MG TABS, Take 10 mg by mouth 4 (four) times daily., Disp: , Rfl:    Pirfenidone 801 MG TABS, Take 1 tablet (801 mg total) by mouth 3 (three) times daily., Disp: 270 tablet, Rfl: 1   rivaroxaban (XARELTO) 20 MG TABS tablet, TAKE 1 TABLET BY MOUTH EVERY DAY WITH SUPPER, Disp: 30 tablet, Rfl: 5   atorvastatin (LIPITOR) 80 MG tablet, Take 1 tablet (80 mg total) by mouth every evening., Disp: 90 tablet, Rfl: 0   cyclobenzaprine (FLEXERIL) 10 MG tablet, Take 1 tablet (10 mg total) by mouth 3 (three) times daily as needed for muscle spasms., Disp: 30 tablet, Rfl: 1   lactulose, encephalopathy, (ENULOSE) 10 GM/15ML SOLN, take BY MOUTH three times daily AS NEEDED FOR mild constipation, Disp: 473 mL, Rfl: 2  Review of Systems:  Negative unless indicated in HPI.   Physical Exam: Vitals:   05/11/23 1134  BP: 110/80  Pulse: (!) 53  Temp: 97.6 F (36.4 C)  TempSrc: Oral  SpO2: 99%  Weight: 177 lb 11.2 oz (80.6 kg)    Body mass index is 29.12 kg/m.    Impression and Plan:  External hemorrhoids -     Hydrocortisone  (Perianal); Place 1 Application rectally 2 (two) times daily.  Dispense: 30 g; Refill: 2  Uterine prolapse -     Ambulatory referral to Gynecology  Dyslipidemia -     Atorvastatin Calcium; Take 1 tablet (80 mg total) by mouth every evening.  Dispense: 90 tablet; Refill: 0  Constipation, unspecified constipation type -     Lactulose Encephalopathy; take BY MOUTH three times daily AS NEEDED FOR mild constipation  Dispense: 473 mL; Refill: 2  Chronic low back pain without sciatica, unspecified back pain laterality -     Cyclobenzaprine HCl; Take 1 tablet (10 mg total) by mouth 3 (three) times daily as needed for muscle spasms.  Dispense: 30 tablet; Refill: 1   -Anusol cream for hemorrhoids. -Refer to GYN for evaluation of a possible uterine prolapse. -Refill meds as requested.  Time spent:30 minutes reviewing chart, interviewing and examining patient and formulating plan of care.     Chaya Jan, MD  Primary Care at [facility]

## 2023-05-14 DIAGNOSIS — M25572 Pain in left ankle and joints of left foot: Secondary | ICD-10-CM | POA: Diagnosis not present

## 2023-05-20 ENCOUNTER — Ambulatory Visit: Payer: PPO | Admitting: Family Medicine

## 2023-05-20 DIAGNOSIS — Z0000 Encounter for general adult medical examination without abnormal findings: Secondary | ICD-10-CM | POA: Diagnosis not present

## 2023-05-20 MED ORDER — BENZONATATE 100 MG PO CAPS: ORAL_CAPSULE | ORAL | 0 refills | Status: DC

## 2023-05-20 NOTE — Progress Notes (Signed)
 PATIENT CHECK-IN and HEALTH RISK ASSESSMENT QUESTIONNAIRE:  -completed by phone/video for upcoming Medicare Preventive Visit  Pre-Visit Check-in: 1)Vitals (height, wt, BP, etc) - record in vitals section for visit on day of visit Request home vitals (wt, BP, etc.) and enter into vitals, THEN update Vital Signs SmartPhrase below at the top of the HPI. See below.  2)Review and Update Medications, Allergies PMH, Surgeries, Social history in Epic 3)Hospitalizations in the last year with date/reason? n  4)Review and Update Care Team (patient's specialists) in Epic 5) Complete PHQ9 in Epic  6) Complete Fall Screening in Epic 7)Review all Health Maintenance Due and order under PCP if not done.  Medicare Wellness Patient Questionnaire:  Answer theses question about your habits: How often do you have a drink containing alcohol?n Have you ever smoked?n On average, how many days per week do you engage in moderate to strenuous exercise (like a brisk walk)? No formal exercise, stays busy though Typical diet: 2 meals a day, cooks herself - feels like gets enough veggies, protein and water Has social outings, loves church, sees granddaughter, has bible study as well  Answer theses question about your everyday activities: Can you perform most household chores?y Are you deaf or have significant trouble hearing?n Do you feel that you have a problem with memory?n Do you feel safe at home?y Last dentist visit?has not gone lately - plans to schedule with a new dentist 8. Do you have any difficulty performing your everyday activities?n Are you having any difficulty walking, taking medications on your own, and or difficulty managing daily home needs?n Do you have difficulty walking or climbing stairs?n Do you have difficulty dressing or bathing?n Do you have difficulty doing errands alone such as visiting a doctor's office or shopping?n Do you currently have any difficulty preparing food and eating?n Do  you currently have any difficulty using the toilet?n Do you have any difficulty managing your finances?n Do you have any difficulties with housekeeping of managing your housekeeping?n   Do you have Advanced Directives in place (Living Will, Healthcare Power or Attorney)? yes   Last eye Exam and location?has glaucoma, sees eye doctor on a regular basis   Do you currently use prescribed or non-prescribed narcotic or opioid pain medications? Uses only as needed sparingly - is aware of risks, sees Dr. Ethelene Hal    ----------------------------------------------------------------------------------------------------------------------------------------------------------------------------------------------------------------------  Because this visit was a virtual/telehealth visit, some criteria may be missing or patient reported. Any vitals not documented were not able to be obtained and vitals that have been documented are patient reported.    MEDICARE ANNUAL PREVENTIVE VISIT WITH PROVIDER: (Welcome to Medicare, initial annual wellness or annual wellness exam)  Virtual Visit via Phone Note  I connected with Miranda [name] on 05/20/23 by phone and verified that I am speaking with the correct person using two identifiers. She declined video visit and prefers phone visit.   Location patient: home Location provider:work or home office Persons participating in the virtual visit: patient, provider  Concerns and/or follow up today:  Has a mild cold the last 2 days, runny nose, mild cough, little HA. Denies fever, CP, SOB, severe HA, malaise. Covid test negative.  Doing ok with this. Saw PCP recently for bladder prolapse - is going to see gynecology. She did not realize they had already called her to try to schedule. Also seeing orthopedic doctor for bone spurs.   Dala has PMH of  has a past medical history of Anemia, Anxiety, Arthritis, Asthma, Atrial fibrillation (  HCC), CHF (congestive heart  failure) (HCC), Chronic kidney disease, Clotting disorder (HCC), Colon polyps, Complication of anesthesia, DDD (degenerative disc disease) (09/17/2011), Depression, DM (diabetes mellitus) (HCC), Family history of anesthesia complication, GERD (gastroesophageal reflux disease), Glaucoma, Hemorrhoids, Hyperlipidemia, Hypertension, IPF (idiopathic pulmonary fibrosis) (HCC), Low back pain, Shortness of breath, and Sleep apnea.    See HM section in Epic for other details of completed HM.    ROS: negative for report of fevers, unintentional weight loss, vision changes, vision loss, hearing loss or change, chest pain, sob, hemoptysis, melena, hematochezia, hematuria, falls, bleeding or bruising, thoughts of suicide or self harm, memory loss  Patient-completed extensive health risk assessment - reviewed and discussed with the patient: See Health Risk Assessment completed with patient prior to the visit either above or in recent phone note. This was reviewed in detailed with the patient today and appropriate recommendations, orders and referrals were placed as needed per Summary below and patient instructions.   Review of Medical History: -PMH, PSH, Family History and current specialty and care providers reviewed and updated and listed below   Patient Care Team: Philip Aspen, Limmie Patricia, MD as PCP - General (Internal Medicine) [place], [name], MD as PCP - Cardiology (Cardiology) Regan Lemming, MD as PCP - Electrophysiology (Cardiology) Clark-Burning, Victorino Dike, PA-C (Inactive) (Dermatology) Sherrill Raring, [facility] (Pharmacist)   Past Medical History:  Diagnosis Date   Anemia    Anxiety    Arthritis    Asthma    Atrial fibrillation (HCC)    CHF (congestive heart failure) (HCC)    pt. unsure- but thinks she was hosp. for CHF- 2002   [facility]   Clotting disorder (HCC)    blood clots in lungs/PE pulmonary embolism   Colon polyps    Complication of  anesthesia    states requires a lot med. to put her to sleep    DDD (degenerative disc disease) 09/17/2011   Depression    "sometimes "   DM (diabetes mellitus) (HCC)    Family history of anesthesia complication    Sister had difficulty waking up from anesthesia   GERD (gastroesophageal reflux disease)    Glaucoma    bilateral, pt. admits that she is noncompliant to eye gtts.    Hemorrhoids    Hyperlipidemia    Hypertension    had stress, echo- 2006 /w [place], Cardiac Cath, per pt. 2002, echo repeated 2012- wnl    IPF (idiopathic pulmonary fibrosis) (HCC)    Low back pain    Shortness of breath    Sleep apnea    uses c-pap- q night recently    Past Surgical History:  Procedure Laterality Date   ABDOMINAL HYSTERECTOMY     ectopic, fibroids   ANTERIOR CERVICAL DECOMP/DISCECTOMY FUSION N/A 09/11/2020   Procedure: Anterior Cervical Decompression Fusion Cervical four-five, Cervical five-six;  Surgeon: Bedelia Person, MD;  Location: [facility] OR;  Service: Neurosurgery;  Laterality: N/A;   arm surgery Right    CARDIAC CATHETERIZATION     CATARACT EXTRACTION, BILATERAL     cataracts removed bilateral- ?IOL   COLONOSCOPY     remote   FISSURECTOMY  10/08/2011   Procedure: FISSURECTOMY;  Surgeon: Almond Lint, MD;  Location: [facility] OR;  Service: General;  Laterality: N/A;   FLEXIBLE SIGMOIDOSCOPY  02/25/2011   Procedure: FLEXIBLE SIGMOIDOSCOPY;  Surgeon: Louis Meckel, MD;  Location: [facility] ENDOSCOPY;  Service: Endoscopy;  Laterality: N/A;   FOOT SURGERY  bilat, heel spurs- screw in R foot    HEMORRHOID SURGERY  10/08/2011   Procedure: HEMORRHOIDECTOMY;  Surgeon: Almond Lint, MD;  Location: [facility] OR;  Service: General;  Laterality: N/A;  External    REVERSE SHOULDER ARTHROPLASTY Left 08/13/2022   Procedure: REVERSE SHOULDER ARTHROPLASTY;  Surgeon: Francena Hanly, MD;  Location: [facility] ORS;  Service: Orthopedics;  Laterality: Left;    ROTATOR CUFF REPAIR Right    SPHINCTEROTOMY   10/08/2011   Procedure: SPHINCTEROTOMY;  Surgeon: Almond Lint, MD;  Location: [facility] OR;  Service: General;  Laterality: N/A;    Social History   Socioeconomic History   Marital status: Widowed    Spouse name: Not on file   Number of children: 4   Years of education: Not on file   Highest education level: Not on file  Occupational History   Occupation: retired Scientist, research (medical): UNEMPLOYED  Tobacco Use   Smoking status: Never    Passive exposure: Never   Smokeless tobacco: Never   Tobacco comments:    Never smoked 04/22/23  Vaping Use   Vaping status: Never Used  Substance and Sexual Activity   Alcohol use: No    Alcohol/week: 0.0 standard drinks of alcohol   Drug use: No   Sexual activity: Never    Comment: hyst  Other Topics Concern   Not on file  Social History Narrative   Not on file   Social Drivers of Health   Financial Resource Strain: Low Risk  (01/09/2022)   Overall Financial Resource Strain (CARDIA)    Difficulty of Paying Living Expenses: Not very hard  Food Insecurity: No Food Insecurity (08/13/2022)   Hunger Vital Sign    Worried About Running Out of Food in the Last Year: Never true    Ran Out of Food in the Last Year: Never true  Transportation Needs: No Transportation Needs (08/13/2022)   PRAPARE - Administrator, Civil Service (Medical): No    Lack of Transportation (Non-Medical): No  Physical Activity: Not on file  Stress: Not on file  Social Connections: Not on file  Intimate Partner Violence: Not At Risk (08/13/2022)   Humiliation, Afraid, Rape, and Kick questionnaire    Fear of Current or Ex-Partner: No    Emotionally Abused: No    Physically Abused: No    Sexually Abused: No    Family History  Problem Relation Age of Onset   Heart attack Mother    Heart disease Mother    Breast cancer Mother    Emphysema Sister    Breast cancer Sister    Arthritis/Rheumatoid Sister    Asthma Sister    Lung cancer Sister    COPD Sister     Colon cancer Brother    Colon cancer Brother    Anesthesia problems Neg Hx    Esophageal cancer Neg Hx    Rectal cancer Neg Hx    Stomach cancer Neg Hx     Current Outpatient Medications on File Prior to Visit  Medication Sig Dispense Refill   acetaminophen (TYLENOL) 325 MG tablet Take 650 mg by mouth every 6 (six) hours as needed for moderate pain.     albuterol (VENTOLIN HFA) 108 (90 Base) MCG/ACT inhaler Inhale 1 puff into the lungs every 6 (six) hours as needed. 18 g 1   ALPRAZolam (XANAX) 0.25 MG tablet Take 1 tablet by mouth three times daily as needed 90 tablet 0   amLODipine (NORVASC) 5 MG tablet Take  1 tablet by mouth once daily 90 tablet 0   atorvastatin (LIPITOR) 80 MG tablet Take 1 tablet (80 mg total) by mouth every evening. 90 tablet 0   cyclobenzaprine (FLEXERIL) 10 MG tablet Take 1 tablet (10 mg total) by mouth 3 (three) times daily as needed for muscle spasms. 30 tablet 1   docusate sodium (COLACE) 100 MG capsule Take 1 capsule (100 mg total) by mouth 2 (two) times daily. 60 capsule 2   dronedarone (MULTAQ) 400 MG tablet Take 1 tablet (400 mg total) by mouth 2 (two) times daily with a meal. 48 tablet 0   ezetimibe (ZETIA) 10 MG tablet Take 1 tablet by mouth once daily 90 tablet 1   hydrochlorothiazide (HYDRODIURIL) 25 MG tablet Take 1 tablet by mouth once daily 90 tablet 0   hydrochlorothiazide (HYDRODIURIL) 25 MG tablet Take 1 tablet (25 mg total) by mouth daily. 90 tablet 0   HYDROcodone-acetaminophen (NORCO) 10-325 MG tablet Take 1 tablet by mouth every 6 (six) hours as needed. 30 tablet 0   hydrocortisone (ANUSOL-HC) 2.5 % rectal cream Place 1 Application rectally 2 (two) times daily. 30 g 2   lactulose, encephalopathy, (ENULOSE) 10 GM/15ML SOLN take BY MOUTH three times daily AS NEEDED FOR mild constipation 473 mL 2   linaclotide (LINZESS) 145 MCG CAPS capsule TAKE ONE CAPSULE BY MOUTH BEFORE BREAKFAST 30 capsule 2   losartan (COZAAR) 100 MG tablet Take 1 tablet  by mouth in the evening 90 tablet 0   ondansetron (ZOFRAN) 4 MG tablet Take 1 tablet (4 mg total) by mouth every 8 (eight) hours as needed for nausea or vomiting. 10 tablet 0   Oxycodone HCl 10 MG TABS Take 10 mg by mouth 4 (four) times daily.     Pirfenidone 801 MG TABS Take 1 tablet (801 mg total) by mouth 3 (three) times daily. 270 tablet 1   rivaroxaban (XARELTO) 20 MG TABS tablet TAKE 1 TABLET BY MOUTH EVERY DAY WITH SUPPER 30 tablet 5   No current facility-administered medications on file prior to visit.    Allergies  Allergen Reactions   Fish Oil Anaphylaxis   Other Hives, Shortness Of Breath and Swelling    Allergic to cashew nuts and peanut oil.   Penicillins Anaphylaxis, Hives and Swelling    Has patient had a PCN reaction causing immediate rash, facial/tongue/throat swelling, SOB or lightheadedness with hypotension: Face swelling and hives started first, then swelling of the throat  Has patient had a PCN reaction causing severe rash involving mucus membranes or skin necrosis: Yes  Has patient had a PCN reaction that required hospitalization: No  Has patient had a PCN reaction occurring within the last 10 years: Yes  If all of the above answers are "NO", then may proceed with Cephalospor   Pneumococcal Vaccines Nausea And Vomiting   Cashew Nut Oil    Nitrofurantoin Macrocrystal Hives   Peanut Oil    Aspirin Itching and Rash   Bactrim [Sulfamethoxazole-Trimethoprim] Hives, Itching and Rash   Ciprofloxacin Hives, Itching and Rash   Ibuprofen Rash   Influenza Vaccines Hives   Ivp Dye [Iodinated Contrast Media] Hives, Itching and Rash    Gives benadryl to counteract symptoms   Latex Rash   Macrobid [Nitrofurantoin Monohydrate Macrocrystals] Hives   Shellfish Allergy Hives    Patient also allergic to seafood       Physical Exam Vitals requested from patient and listed below if patient had equipment and was able to obtain at  home for this virtual visit: There were no  vitals filed for this visit. Estimated body mass index is 29.12 kg/m as calculated from the following:   Height as of 04/22/23: 5' 5.5" (1.664 m).   Weight as of 05/11/23: 177 lb 11.2 oz (80.6 kg).  EKG (optional): deferred due to virtual visit  GENERAL: alert, oriented, no acute distress detected, full vision exam deferred due to pandemic and/or virtual encounter  PSYCH/NEURO: pleasant and cooperative, no obvious depression or anxiety, speech and thought processing grossly intact, Cognitive function grossly intact  Flowsheet Row Office Visit from [facility]/11/2023 [facility]  PHQ-9 Total Score 4           05/20/2023    1:22 PM 05/11/2023    1:58 PM 02/22/2023    9:59 AM 02/19/2022    1:26 PM 01/20/2022    2:32 PM  Depression screen PHQ 2/9  Decreased Interest 0 0 0 0 0  Down, Depressed, Hopeless 1 0 0 0 0  PHQ - 2 Score 1 0 0 0 0  Altered sleeping  2 0 0 0  Tired, decreased energy  2 0 0 0  Change in appetite  0 0 0 0  Feeling bad or failure about yourself   0 0 0 0  Trouble concentrating  0 0 0 0  Moving slowly or fidgety/restless  0 0 0 0  Suicidal thoughts  0 0 0 0  PHQ-9 Score  4 0 0 0  Difficult doing work/chores  Somewhat difficult Not difficult at all Not difficult at all Not difficult at all  She lost several family members in the last year including her husband. Doing better now. Uses xanax sparingly - has not needed as much.     04/24/2021    2:41 PM 02/19/2022    1:26 PM 02/22/2023    9:59 AM 05/11/2023    1:58 PM 05/20/2023    1:19 PM  Fall Risk  Falls in the past year? 1 0 0 0 0  Was there an injury with Fall? 0 0 0 0 0  Fall Risk Category Calculator 2 0 0 0 0  Fall Risk Category (Retired) Moderate Low     (RETIRED) Patient Fall Risk Level  Low fall risk     Patient at Risk for Falls Due to  No Fall Risks     Fall risk Follow up  Falls evaluation completed Falls evaluation completed Falls evaluation completed Falls evaluation  completed;Education provided     SUMMARY AND PLAN:  Encounter for Medicare annual wellness exam  Discussed applicable health maintenance/preventive health measures and advised and referred or ordered per patient preferences: -discussed vaccines due, risks, and that she can get at the pharmacy, advised to let us know if she does -advised eye exam and she agrees to call herself to schedule -discussed other measures and can do at next office visit Health Maintenance  Topic Date Due   OPHTHALMOLOGY EXAM  Never done   Hepatitis C Screening  Never done   DTaP/Tdap/Td (1 - Tdap) Never done   Zoster Vaccines- Shingrix (1 of 2) Never done   FOOT EXAM  02/03/2019   Diabetic kidney evaluation - Urine ACR  09/19/2022   COVID-19 Vaccine (5 - 2024-25 season) 11/15/2022   HEMOGLOBIN A1C  08/23/2023   Colonoscopy  11/22/2023   Diabetic kidney evaluation - eGFR measurement  12/16/2023   Medicare Annual Wellness (AWV)  05/19/2024   Pneumonia Vaccine 65+ Years  old  Completed   DEXA SCAN  Completed   HPV VACCINES  Aged Out      Education and counseling on the following was provided based on the above review of health and a plan/checklist for the patient, along with additional information discussed, was provided for the patient in the patient instructions :  -Provided counseling and plan for increased risk of falling if applicable per above screening. Reviewed and demonstrated safe balance exercises that can be done at home to improve balance and discussed exercise guidelines for adults with include balance exercises at least 3 days per week.  -Advised and counseled on a healthy lifestyle - including the importance of a healthy diet, regular physical activity, social connections and stress management. -Reviewed patient's current diet. Advised and counseled on a whole foods based healthy diet. A summary of a healthy diet was provided in the Patient Instructions.  -reviewed patient's current physical  activity level and discussed exercise guidelines for adults. Discussed community resources and ideas for safe exercise at home to assist in meeting exercise guideline recommendations in a safe and healthy way.  -Advise yearly dental visits at minimum and regular eye exams -discussed potential causes of UR symptoms, options for evaluation, treatment, risks, precautions. She feels is mild and opted to try Tessalon rx for cough - sent rx. She agrees to seek inperson care if worsening or not improving as expected. -provided her number for gynecology office to call since she did not realize they called her to schedule, advised to call office is any issues in contacting or scheduling.  Follow up: see patient instructions     Patient Instructions  I really enjoyed getting to talk with you today! I am available on Tuesdays and Thursdays for virtual visits if you have any questions or concerns, or if I can be of any further assistance.   -I sent the medication(s) we discussed to your pharmacy: Meds ordered this encounter  Medications   benzonatate (TESSALON PERLES) 100 MG capsule    Sig: 1-2 capsules up to twice daily as needed for cough.    Dispense:  30 capsule    Refill:  0     I hope you are feeling better soon!  Seek in person care promptly if your symptoms worsen, new concerns arise or you are not improving with treatment.  II help [facility] out with telemedicine visits on Tuesdays and Thursdays and am happy to help if you need a virtual follow up visit on those days. Otherwise, if you have any concerns or questions following this visit please schedule a follow up visit with your Primary Care office or seek care at a local urgent care clinic to avoid delays in care. If you are having severe or life threatening symptoms please call 911 and/or go to the nearest emergency room. '  CHECKLIST FROM [place] VISIT:  -Follow up (please call to schedule if not scheduled after  visit):   -yearly for annual wellness visit with primary care office  Here is a list of your preventive care/health maintenance measures and the plan for each if any are due:  PLAN For any measures below that may be due:   Health Maintenance  Topic Date Due   OPHTHALMOLOGY EXAM  Never done   Hepatitis C Screening  Never done   DTaP/Tdap/Td (1 - Tdap) Never done   Zoster Vaccines- Shingrix (1 of 2) Never done   FOOT EXAM  02/03/2019   Diabetic kidney evaluation - Urine ACR  09/19/2022   COVID-19 Vaccine (5 - 2024-25 season) 11/15/2022   HEMOGLOBIN A1C  08/23/2023   Colonoscopy  11/22/2023   Diabetic kidney evaluation - eGFR measurement  12/16/2023   Medicare Annual Wellness (AWV)  05/19/2024   Pneumonia Vaccine 69+ Years old  Completed   DEXA SCAN  Completed   HPV VACCINES  Aged Out    -See a dentist at least yearly  -Get your eyes checked and then per your eye specialist's recommendations  -Other issues addressed today:   -I have included below further information regarding a healthy whole foods based diet, physical activity guidelines for adults, stress management and opportunities for social connections. I hope you find this information useful.   -----------------------------------------------------------------------------------------------------------------------------------------------------------------------------------------------------------------------------------------------------------    NUTRITION: -eat real food: lots of colorful vegetables (half the plate) and fruits -5-7 servings of vegetables and fruits per day (fresh or steamed is best), exp. 2 servings of vegetables with lunch and dinner and 2 servings of fruit per day. Berries and greens such as kale and collards are great choices.  -consume on a regular basis:  fresh fruits, fresh veggies, fish, nuts, seeds, healthy oils (such as olive oil, avocado oil), whole grains (make sure for  bread/pasta/crackers/etc., that the first ingredient on label contains the word "whole"), legumes. -can eat small amounts of dairy and lean meat (no larger than the palm of your hand), but avoid processed meats such as ham, bacon, lunch meat, etc. -drink water -try to avoid fast food and pre-packaged foods, processed meat, ultra processed foods/beverages (donuts, candy, etc.) -most experts advise limiting sodium to < 2300mg  per day, should limit further is any chronic conditions such as high blood pressure, heart disease, diabetes, etc. The American Heart Association advised that < 1500mg  is is ideal -try to avoid foods/beverages that contain any ingredients with names you do not recognize  -try to avoid foods/beverages  with added sugar or sweeteners/sweets  -try to avoid sweet drinks (including diet drinks): soda, juice, Gatorade, sweet tea, power drinks, diet drinks -try to avoid white rice, white bread, pasta (unless whole grain)  EXERCISE GUIDELINES FOR ADULTS: -if you wish to increase your physical activity, do so gradually and with the approval of your doctor -STOP and seek medical care immediately if you have any chest pain, chest discomfort or trouble breathing when starting or increasing exercise  -move and stretch your body, legs, feet and arms when sitting for long periods -Physical activity guidelines for optimal health in adults: -get at least 150 minutes per week of moderate exercise (can talk, but not sing); this is about 20-30 minutes of sustained activity 5-7 days per week or two 10-15 minute episodes of sustained activity 5-7 days per week -do some muscle building/resistance training/strength training at least 2 days per week  -balance exercises 3+ days per week:   Stand somewhere where you have something sturdy to hold onto if you lose balance    1) lift up on toes, then back down, start with 5x per day and work up to 20x   2) stand and lift one leg straight out to the side so  that foot is a few inches of the floor, start with 5x each side and work up to 20x each side   3) stand on one foot, start with 5 seconds each side and work up to 20 seconds on each side  If you need ideas or help with getting more active:  -Silver sneakers https://tools.silversneakers.com  -Walk with a Doc: http://www.duncan-williams.com/  -  try to include resistance (weight lifting/strength building) and balance exercises twice per week: or the following link for ideas: http://castillo-powell.com/  BuyDucts.dk  STRESS MANAGEMENT: -can try meditating, or just sitting quietly with deep breathing while intentionally relaxing all parts of your body for 5 minutes daily -if you need further help with stress, anxiety or depression please follow up with your primary doctor or contact the wonderful folks at [facility]: [phone]  SOCIAL CONNECTIONS: -options in [place] if you wish to engage in more social and exercise related activities:  -Silver sneakers https://tools.silversneakers.com  -Walk with a Doc: http://www.duncan-williams.com/  -Check out the [facility] Active Adults 50+ section on the [place] [employer] (hiking clubs, book clubs, cards and games, chess, exercise classes, aquatic classes and much more) - see the website for details: https://www.[place].gov/departments/parks-recreation/active-adults50  -YouTube has lots of exercise videos for different ages and abilities as well  -[facility] (a variety of indoor and outdoor inperson activities for adults). [phone]. [address]  -Virtual Online Classes (a variety of topics): see seniorplanet.org or call [phone]  -consider volunteering at a school, hospice center, church, senior center or elsewhere            Terressa Koyanagi, DO

## 2023-05-20 NOTE — Patient Instructions (Addendum)
 I really enjoyed getting to talk with you today! I am available on Tuesdays and Thursdays for virtual visits if you have any questions or concerns, or if I can be of any further assistance.   -I sent the medication(s) we discussed to your pharmacy: Meds ordered this encounter  Medications   benzonatate (TESSALON PERLES) 100 MG capsule    Sig: 1-2 capsules up to twice daily as needed for cough.    Dispense:  30 capsule    Refill:  0     I hope you are feeling better soon!  Seek in person care promptly if your symptoms worsen, new concerns arise or you are not improving with treatment.  II help [place] out with telemedicine visits on Tuesdays and Thursdays and am happy to help if you need a virtual follow up visit on those days. Otherwise, if you have any concerns or questions following this visit please schedule a follow up visit with your Primary Care office or seek care at a local urgent care clinic to avoid delays in care. If you are having severe or life threatening symptoms please call 911 and/or go to the nearest emergency room. '  CHECKLIST FROM [place] VISIT:  -Follow up (please call to schedule if not scheduled after visit):   -yearly for annual wellness visit with primary care office  Here is a list of your preventive care/health maintenance measures and the plan for each if any are due:  PLAN For any measures below that may be due:   Health Maintenance  Topic Date Due   OPHTHALMOLOGY EXAM  Never done   Hepatitis C Screening  Never done   DTaP/Tdap/Td (1 - Tdap) Never done   Zoster Vaccines- Shingrix (1 of 2) Never done   FOOT EXAM  02/03/2019   Diabetic kidney evaluation - Urine ACR  09/19/2022   COVID-19 Vaccine (5 - 2024-25 season) 11/15/2022   HEMOGLOBIN A1C  08/23/2023   Colonoscopy  11/22/2023   Diabetic kidney evaluation - eGFR measurement  12/16/2023   Medicare Annual Wellness (AWV)  05/19/2024   Pneumonia Vaccine 66+ Years old  Completed   DEXA  SCAN  Completed   HPV VACCINES  Aged Out    -See a dentist at least yearly  -Get your eyes checked and then per your eye specialist's recommendations  -Other issues addressed today:   -I have included below further information regarding a healthy whole foods based diet, physical activity guidelines for adults, stress management and opportunities for social connections. I hope you find this information useful.   -----------------------------------------------------------------------------------------------------------------------------------------------------------------------------------------------------------------------------------------------------------    NUTRITION: -eat real food: lots of colorful vegetables (half the plate) and fruits -5-7 servings of vegetables and fruits per day (fresh or steamed is best), exp. 2 servings of vegetables with lunch and dinner and 2 servings of fruit per day. Berries and greens such as kale and collards are great choices.  -consume on a regular basis:  fresh fruits, fresh veggies, fish, nuts, seeds, healthy oils (such as olive oil, avocado oil), whole grains (make sure for bread/pasta/crackers/etc., that the first ingredient on label contains the word "whole"), legumes. -can eat small amounts of dairy and lean meat (no larger than the palm of your hand), but avoid processed meats such as ham, bacon, lunch meat, etc. -drink water -try to avoid fast food and pre-packaged foods, processed meat, ultra processed foods/beverages (donuts, candy, etc.) -most experts advise limiting sodium to < 2300mg  per day, should limit further is any chronic conditions  such as high blood pressure, heart disease, diabetes, etc. The American Heart Association advised that < 1500mg  is is ideal -try to avoid foods/beverages that contain any ingredients with names you do not recognize  -try to avoid foods/beverages  with added sugar or sweeteners/sweets  -try to avoid sweet  drinks (including diet drinks): soda, juice, Gatorade, sweet tea, power drinks, diet drinks -try to avoid white rice, white bread, pasta (unless whole grain)  EXERCISE GUIDELINES FOR ADULTS: -if you wish to increase your physical activity, do so gradually and with the approval of your doctor -STOP and seek medical care immediately if you have any chest pain, chest discomfort or trouble breathing when starting or increasing exercise  -move and stretch your body, legs, feet and arms when sitting for long periods -Physical activity guidelines for optimal health in adults: -get at least 150 minutes per week of moderate exercise (can talk, but not sing); this is about 20-30 minutes of sustained activity 5-7 days per week or two 10-15 minute episodes of sustained activity 5-7 days per week -do some muscle building/resistance training/strength training at least 2 days per week  -balance exercises 3+ days per week:   Stand somewhere where you have something sturdy to hold onto if you lose balance    1) lift up on toes, then back down, start with 5x per day and work up to 20x   2) stand and lift one leg straight out to the side so that foot is a few inches of the floor, start with 5x each side and work up to 20x each side   3) stand on one foot, start with 5 seconds each side and work up to 20 seconds on each side  If you need ideas or help with getting more active:  -Silver sneakers https://tools.silversneakers.com  -Walk with a Doc: http://www.duncan-williams.com/  -try to include resistance (weight lifting/strength building) and balance exercises twice per week: or the following link for ideas: http://castillo-powell.com/  BuyD[employer]s.dk  STRESS MANAGEMENT: -can try meditating, or just sitting quietly with deep breathing while intentionally relaxing all parts of your body for 5 minutes daily -if you need further  help with stress, anxiety or depression please follow up with your primary doctor or contact the wonderful folks at [facility]: [phone]  SOCIAL CONNECTIONS: -options in [place] if you wish to engage in more social and exercise related activities:  -Silver sneakers https://tools.silversneakers.com  -Walk with a Doc: http://www.duncan-williams.com/  -Check out the [facility] Active Adults 50+ section on the [place] [employer] (hiking clubs, book clubs, cards and games, chess, exercise classes, aquatic classes and much more) - see the website for details: https://www.[place].gov/departments/parks-recreation/active-adults50  -YouTube has lots of exercise videos for different ages and abilities as well  -[facility] (a variety of indoor and outdoor inperson activities for adults). [phone]. [address]  -Virtual Online Classes (a variety of topics): see seniorplanet.org or call [phone]  -consider volunteering at a school, hospice center, church, senior center or elsewhere

## 2023-05-24 DIAGNOSIS — Z79899 Other long term (current) drug therapy: Secondary | ICD-10-CM | POA: Diagnosis not present

## 2023-05-24 DIAGNOSIS — T402X5A Adverse effect of other opioids, initial encounter: Secondary | ICD-10-CM | POA: Diagnosis not present

## 2023-05-24 DIAGNOSIS — M5416 Radiculopathy, lumbar region: Secondary | ICD-10-CM | POA: Diagnosis not present

## 2023-05-24 DIAGNOSIS — M51362 Other intervertebral disc degeneration, lumbar region with discogenic back pain and lower extremity pain: Secondary | ICD-10-CM | POA: Diagnosis not present

## 2023-05-24 DIAGNOSIS — M5412 Radiculopathy, cervical region: Secondary | ICD-10-CM | POA: Diagnosis not present

## 2023-05-28 DIAGNOSIS — M7662 Achilles tendinitis, left leg: Secondary | ICD-10-CM | POA: Insufficient documentation

## 2023-05-31 ENCOUNTER — Other Ambulatory Visit: Payer: Self-pay | Admitting: Obstetrics and Gynecology

## 2023-05-31 ENCOUNTER — Other Ambulatory Visit (HOSPITAL_COMMUNITY): Payer: Self-pay

## 2023-05-31 DIAGNOSIS — Z1231 Encounter for screening mammogram for malignant neoplasm of breast: Secondary | ICD-10-CM

## 2023-06-01 DIAGNOSIS — N8111 Cystocele, midline: Secondary | ICD-10-CM | POA: Diagnosis not present

## 2023-06-01 DIAGNOSIS — R3915 Urgency of urination: Secondary | ICD-10-CM | POA: Diagnosis not present

## 2023-06-01 DIAGNOSIS — N816 Rectocele: Secondary | ICD-10-CM | POA: Diagnosis not present

## 2023-06-14 ENCOUNTER — Other Ambulatory Visit: Payer: Self-pay | Admitting: Internal Medicine

## 2023-06-14 DIAGNOSIS — F419 Anxiety disorder, unspecified: Secondary | ICD-10-CM

## 2023-06-23 DIAGNOSIS — M7662 Achilles tendinitis, left leg: Secondary | ICD-10-CM | POA: Diagnosis not present

## 2023-06-28 ENCOUNTER — Ambulatory Visit
Admission: RE | Admit: 2023-06-28 | Discharge: 2023-06-28 | Disposition: A | Discharge status: Home / Self Care | Source: Ambulatory Visit | Attending: Pulmonary Disease

## 2023-06-28 DIAGNOSIS — I700 Atherosclerosis of aorta: Secondary | ICD-10-CM | POA: Diagnosis not present

## 2023-06-28 DIAGNOSIS — J84112 Idiopathic pulmonary fibrosis: Secondary | ICD-10-CM

## 2023-06-28 DIAGNOSIS — J8410 Pulmonary fibrosis, unspecified: Secondary | ICD-10-CM | POA: Diagnosis not present

## 2023-06-28 DIAGNOSIS — I2510 Atherosclerotic heart disease of native coronary artery without angina pectoris: Secondary | ICD-10-CM | POA: Diagnosis not present

## 2023-06-30 ENCOUNTER — Other Ambulatory Visit: Payer: Self-pay | Admitting: Internal Medicine

## 2023-06-30 ENCOUNTER — Ambulatory Visit: Admitting: Obstetrics and Gynecology

## 2023-06-30 DIAGNOSIS — M7662 Achilles tendinitis, left leg: Secondary | ICD-10-CM | POA: Diagnosis not present

## 2023-06-30 DIAGNOSIS — I10 Essential (primary) hypertension: Secondary | ICD-10-CM

## 2023-07-03 ENCOUNTER — Other Ambulatory Visit: Payer: Self-pay | Admitting: Internal Medicine

## 2023-07-05 ENCOUNTER — Other Ambulatory Visit: Payer: Self-pay | Admitting: Internal Medicine

## 2023-07-05 ENCOUNTER — Telehealth: Payer: Self-pay

## 2023-07-05 DIAGNOSIS — K5900 Constipation, unspecified: Secondary | ICD-10-CM

## 2023-07-05 MED ORDER — EZETIMIBE 10 MG PO TABS: 10.0000 mg | ORAL_TABLET | Freq: Every day | ORAL | 1 refills | Status: DC

## 2023-07-05 MED ORDER — LACTULOSE ENCEPHALOPATHY 10 GM/15ML PO SOLN: ORAL | 2 refills | Status: DC

## 2023-07-05 NOTE — Telephone Encounter (Signed)
 Copied from CRM [id number]. Topic: Clinical - Medication Refill >> Jul 05, 2023  9:19 AM Emylou G wrote: Most Recent Primary Care Visit:  Provider: Maurie Southern  Department: [facility]  Visit Type: ANNUAL WELL VISIT, SEQUENTIAL  Date: 05/20/2023  Medication: lactulose , encephalopathy, (ENULOSE ) 10 GM/15ML SOLN ezetimibe  (ZETIA ) 10 MG tablet   Has the patient contacted their pharmacy? Yes (Agent: If no, request that the patient contact the pharmacy for the refill. If patient does not wish to contact the pharmacy document the reason why and proceed with request.) (Agent: If yes, when and what did the pharmacy advise?) said to call us   Is this the correct pharmacy for this prescription? Yes If no, delete pharmacy and type the correct one.  This is the patient's preferred pharmacy:  [facility] [address] ([address] [place] - [address] [place] ([place]) [place] [address] Phone: [phone] Fax: [phone]    Has the prescription been filled recently? No  Is the patient out of the medication? NO  Has the patient been seen for an appointment in the last year OR does the patient have an upcoming appointment? Yes  Can we respond through MyChart? No  Agent: Please be advised that Rx refills may take up to 3 business days. We ask that you follow-up with your pharmacy.

## 2023-07-05 NOTE — Telephone Encounter (Signed)
 Copied from CRM [id number]. Topic: General - Other >> Jul 05, 2023  9:16 AM Miranda [name] wrote: Reason for CRM: Nitroglycerin.Miranda [name] is it okay to use half a patch (sample).. per her foot specialist wants to make sure its okay start/continue..number on file good  She is also checking status of CGM.Miranda [name] been waiting..  needs the sensor 2 not the 3.Miranda [name]

## 2023-07-07 ENCOUNTER — Ambulatory Visit: Payer: PPO | Admitting: Pulmonary Disease

## 2023-07-07 ENCOUNTER — Encounter: Payer: Self-pay | Admitting: Pulmonary Disease

## 2023-07-07 VITALS — BP 132/62 | HR 60 | Temp 97.9°F | Ht 65.0 in | Wt 173.0 lb

## 2023-07-07 DIAGNOSIS — J84112 Idiopathic pulmonary fibrosis: Secondary | ICD-10-CM

## 2023-07-07 DIAGNOSIS — Z5181 Encounter for therapeutic drug level monitoring: Secondary | ICD-10-CM | POA: Diagnosis not present

## 2023-07-07 DIAGNOSIS — R0600 Dyspnea, unspecified: Secondary | ICD-10-CM

## 2023-07-07 DIAGNOSIS — I4891 Unspecified atrial fibrillation: Secondary | ICD-10-CM

## 2023-07-07 DIAGNOSIS — I2699 Other pulmonary embolism without acute cor pulmonale: Secondary | ICD-10-CM

## 2023-07-07 LAB — COMPREHENSIVE METABOLIC PANEL WITH GFR
ALT: 19 U/L (ref 0–35)
AST: 20 U/L (ref 0–37)
Albumin: 4.3 g/dL (ref 3.5–5.2)
Alkaline Phosphatase: 44 U/L (ref 39–117)
BUN: 16 mg/dL (ref 6–23)
CO2: 31 meq/L (ref 19–32)
Calcium: 10 mg/dL (ref 8.4–10.5)
Chloride: 100 meq/L (ref 96–112)
Creatinine, Ser: 0.97 mg/dL (ref 0.40–1.20)
GFR: 56.66 mL/min — ABNORMAL LOW (ref >60.00)
Glucose, Bld: 105 mg/dL — ABNORMAL HIGH (ref 70–99)
Potassium: 3.8 meq/L (ref 3.5–5.1)
Sodium: 137 meq/L (ref 135–145)
Total Bilirubin: 0.7 mg/dL (ref 0.2–1.2)
Total Protein: 7.9 g/dL (ref 6.0–8.3)

## 2023-07-07 LAB — PULMONARY FUNCTION TEST
DL/VA % pred: 101 %
DL/VA: 4.12 ml/min/mmHg/L
DLCO unc % pred: 75 %
DLCO unc: 14.93 ml/min/mmHg
FEF 25-75 Post: 1.86 L/s
FEF 25-75 Pre: 1.52 L/s
FEF2575-%Change-Post: 22 %
FEF2575-%Pred-Post: 111 %
FEF2575-%Pred-Pre: 90 %
FEV1-%Change-Post: 9 %
FEV1-%Pred-Post: 81 %
FEV1-%Pred-Pre: 74 %
FEV1-Post: 1.77 L
FEV1-Pre: 1.63 L
FEV1FVC-%Change-Post: 3 %
FEV1FVC-%Pred-Pre: 103 %
FEV6-%Change-Post: 8 %
FEV6-%Pred-Post: 80 %
FEV6-%Pred-Pre: 73 %
FEV6-Post: 2.22 L
FEV6-Pre: 2.04 L
FEV6FVC-%Change-Post: 2 %
FEV6FVC-%Pred-Post: 105 %
FEV6FVC-%Pred-Pre: 102 %
FVC-%Change-Post: 5 %
FVC-%Pred-Post: 76 %
FVC-%Pred-Pre: 72 %
FVC-Post: 2.22 L
FVC-Pre: 2.11 L
Post FEV1/FVC ratio: 80 %
Post FEV6/FVC ratio: 100 %
Pre FEV1/FVC ratio: 77 %
Pre FEV6/FVC Ratio: 97 %
RV % pred: 70 %
RV: 1.66 L
TLC % pred: 73 %
TLC: 3.84 L

## 2023-07-07 LAB — BRAIN NATRIURETIC PEPTIDE: Pro B Natriuretic peptide (BNP): 180 pg/mL — ABNORMAL HIGH (ref 0.0–100.0)

## 2023-07-07 NOTE — Progress Notes (Signed)
 Full pft performed today.

## 2023-07-07 NOTE — Patient Instructions (Signed)
 Full pft performed today.

## 2023-07-07 NOTE — Patient Instructions (Signed)
 VISIT SUMMARY:  You had a follow-up appointment to discuss your interstitial lung disease and sleep apnea. Your breathing is stable, and your recent CT scan shows no significant changes. You mentioned occasional coughing, headaches, and difficulty walking long distances. You also discussed issues with your CPAP machine and medication delivery.  YOUR PLAN:  -IDIOPATHIC PULMONARY FIBROSIS (IPF): IPF is a condition where the lungs become scarred and breathing becomes difficult. Your condition is stable with the use of pirfenidone , and your recent CT scan shows minimal progression. Continue taking pirfenidone  as prescribed. We will order liver function tests to monitor for any potential liver issues related to the medication. We will also help resolve the medication delivery issues you are experiencing.  -OBSTRUCTIVE SLEEP APNEA: Obstructive sleep apnea is a condition where your breathing stops and starts during sleep. You have been experiencing discomfort with your CPAP machine, which has led to you not using it for about a month. Please try to resume using the CPAP machine to see if the discomfort continues. We will reassess your use of the CPAP machine at your next visit and address any ongoing issues.  INSTRUCTIONS:  Please continue taking pirfenidone  as prescribed and resume using your CPAP machine. We will order liver function tests to monitor your liver health. If you continue to have issues with your CPAP machine or medication delivery, please let us  know. We will follow up on these issues at your next visit.

## 2023-07-07 NOTE — Progress Notes (Signed)
 Miranda [name]    [id number]    [birth year]  Primary Care Physician:Miranda Miranda Imus, MD  Referring Physician: Zilphia [name], Miranda Coppersmith, MD [address] [place],  [place] [address]  Chief complaint: Follow-up for interstitial lung disease, OSA  HPI: [name] is here for follow-up of obstructive sleep apnea, pulmonary fibrosis. PE in 2018 opn anticoagulation, proxysmal atrial fib She has history of UIP fibrosis.  There is history of rheumatoid arthritis in chart but connective tissue serologies are negative with no arthritis, joint pain symptoms.  Pulmonary fibrosis is felt to be more consistent with IPF and she has been on Esbriet  since 2019  She was hospitalized in early February 2023 for dehydration, AKI and rapid atrial fibrillation.  Overall improved since discharge  Pets: Has a dog.  No cats, birds, farm animals Occupation: Retired Lawyer Exposures: Has feather pillows at home but not in her bedroom.  No mold, hot tub, Jacuzzi Smoking history: Never smoker Travel history: No significant travel history Relevant family history: No significant family history of lung disease  Interim history: Discussed the use of AI scribe software for clinical note transcription with the patient, who gave verbal consent to proceed.  History of Present Illness Miranda [name] is a 78 year old female with interstitial lung disease who presents for follow-up.  She experiences occasional coughing and headaches, and sometimes has difficulty walking long distances. Her breathing is stable overall.  She is currently taking pirfenidone  for her interstitial lung disease without side effects. A recent CT scan is stable compared to previous scans, with the last one conducted in 2021. A recent lung function test showed a slight decrease in lung function.  She has been using a CPAP machine for sleep apnea since 2018 but has not used it for about a month due to discomfort,  describing it as 'too much air going somewhere.'  She encountered a mix-up with her medication delivery, having issues with the address or phone number when attempting to call for a refill. She is awaiting assistance to resolve this issue.      Outpatient Encounter Medications as of 07/07/2023  Medication Sig   acetaminophen  (TYLENOL ) 325 MG tablet Take 650 mg by mouth every 6 (six) hours as needed for moderate pain.   albuterol  (VENTOLIN  HFA) 108 (90 Base) MCG/ACT inhaler Inhale 1 puff into the lungs every 6 (six) hours as needed.   ALPRAZolam  (XANAX ) 0.25 MG tablet Take 1 tablet by mouth three times daily as needed   amLODipine  (NORVASC ) 5 MG tablet Take 1 tablet by mouth once daily   atorvastatin  (LIPITOR ) 80 MG tablet Take 1 tablet (80 mg total) by mouth every evening.   benzonatate  (TESSALON  PERLES) 100 MG capsule 1-2 capsules up to twice daily as needed for cough.   Continuous Glucose Sensor (FREESTYLE LIBRE 2 SENSOR) MISC USE AS DIRECTED   cyclobenzaprine  (FLEXERIL ) 10 MG tablet Take 1 tablet (10 mg total) by mouth 3 (three) times daily as needed for muscle spasms.   docusate sodium  (COLACE) 100 MG capsule Take 1 capsule (100 mg total) by mouth 2 (two) times daily.   dronedarone  (MULTAQ ) 400 MG tablet Take 1 tablet (400 mg total) by mouth 2 (two) times daily with a meal.   ezetimibe  (ZETIA ) 10 MG tablet Take 1 tablet (10 mg total) by mouth daily.   hydrochlorothiazide  (HYDRODIURIL ) 25 MG tablet Take 1 tablet by mouth once daily   hydrochlorothiazide  (HYDRODIURIL ) 25  MG tablet Take 1 tablet (25 mg total) by mouth daily.   HYDROcodone -acetaminophen  (NORCO) 10-325 MG tablet Take 1 tablet by mouth every 6 (six) hours as needed.   hydrocortisone  (ANUSOL -HC) 2.5 % rectal cream Place 1 Application rectally 2 (two) times daily.   lactulose , encephalopathy, (ENULOSE ) 10 GM/15ML SOLN take BY MOUTH three times daily AS NEEDED FOR mild constipation   linaclotide  (LINZESS ) 145 MCG CAPS capsule  TAKE ONE CAPSULE BY MOUTH BEFORE BREAKFAST   losartan  (COZAAR ) 100 MG tablet Take 1 tablet by mouth in the evening   ondansetron  (ZOFRAN ) 4 MG tablet Take 1 tablet (4 mg total) by mouth every 8 (eight) hours as needed for nausea or vomiting.   Oxycodone  HCl 10 MG TABS Take 10 mg by mouth 4 (four) times daily.   Pirfenidone  801 MG TABS Take 1 tablet (801 mg total) by mouth 3 (three) times daily.   rivaroxaban  (XARELTO ) 20 MG TABS tablet TAKE 1 TABLET BY MOUTH EVERY DAY WITH SUPPER   No facility-administered encounter medications on file as of 07/07/2023.   Physical Exam: Blood pressure 133/69, pulse (!) 54, temperature (!) 97.1 F (36.2 C), temperature source Temporal, height 5' 5.5" (1.664 m), weight 174 lb 9.6 oz (79.2 kg), SpO2 98%. Gen:      No acute distress HEENT:  EOMI, sclera anicteric Neck:     No masses; no thyromegaly Lungs:    Mild Bibasal crackles CV:         Regular rate and rhythm; no murmurs Abd:      + bowel sounds; soft, non-tender; no palpable masses, no distension Ext:    No edema; adequate peripheral perfusion Skin:      Warm and dry; no rash Neuro: alert and oriented x 3 Psych: normal mood and affect   Data Reviewed: Imaging: High-res CT 12/09/2018- mild pulmonary fibrosis and basilar predominance.  Traction bronchiectasis, honeycombing.  Unchanged compared to 2017  High-res CT 12/15/2019-unchanged UIP pulmonary fibrosis  High-res CT 10/24/2021-stable pattern of UIP pulmonary fibrosis  High-res CT/14/2025-pulmonary fibrosis and UIP pattern.  Appears stable/minimally progressive by my review.  Final radiology read is pending I have reviewed all images personally  PFTs: 06/14/2017 FVC 2.21 [89%], FEV1 1.88 [98%],/55, TLC 3.68 [70%], DLCO 14.69 [57%]  10/15/2019 FVC 2.10 [83%), FEV1 1.42 [73%], F/F 68, TLC 3.77 70%], DLCO 16.51 [80%]  11/12/2021 FVC 2.25 [73%], FEV1 1.91 [82%], F/F 85, TLC 3.79 [70%], DLCO 16.71 [81%] Mild restriction  07/07/2023 FVC 2.22 [76%],  FEV1 1.77 [81%], F/F80, TLC 2.84 [73%], DLCO 14.93 [75%] Mild restriction, mild diffusion defect  Labs: CTD serologies including CCP, rheumatoid factor 2017, 2018, 2019-negative Comprehensive metabolic panel 11/09/2018- normal.  Sleep study: 10/2016 PSG> AHI 10.5   CPAP download 12/19/2018-77% compliance.  Residual AHI 2  Cardiac: Echocardiogram  10/19/2017-LVEF 60 to 65%, grade 1 diastolic dysfunction, mild RV dilatation. 04/14/2021- LVEF 55 - 60 %, RV systolic function is normal, RV systolic size is normal, normal PA systolic pressure Assessment & Plan Idiopathic Pulmonary Fibrosis (IPF) Although she has history of rheumatoid arthritis in the chart she does not have any symptoms.  And has negative serologies.  Presentation is consistent with IPF which has been stable on CT and PFTs.  No pulmonary hypertension on echocardiogram  Well-managed with pirfenidone , showing minimal progression on recent CT compared to 2023 imaging. Lung function tests indicate a slight decrease, consistent with stable disease management. No side effects from pirfenidone  reported.  - Continue pirfenidone  therapy. - Order liver function  tests to monitor for pirfenidone -related hepatotoxicity. - Coordinate with pharmacy for medication refill and resolve any delivery issues.  Obstructive Sleep Apnea Experiences discomfort with CPAP, including issues with air pressure and mask fit, leading to discontinuation for about a month. Previously adhered to CPAP since 2018. - Encourage resumption of CPAP to evaluate if discomfort persists. - Reevaluate CPAP adherence and address issues at the next visit if discomfort continues.   Remote pulmonary embolism, afib Continue anticoagulation   Plan/Recommendations: - Continue Esbriet  - Check labs for monitoring - Continue CPAP  Phyllis Breeze MD [facility] 07/07/2023, 3:37 PM  CC: Miranda [name], [name]

## 2023-07-09 ENCOUNTER — Ambulatory Visit
Admission: RE | Admit: 2023-07-09 | Discharge: 2023-07-09 | Disposition: A | Discharge status: Home / Self Care | Source: Ambulatory Visit | Attending: Obstetrics and Gynecology | Admitting: Obstetrics and Gynecology

## 2023-07-09 DIAGNOSIS — Z1231 Encounter for screening mammogram for malignant neoplasm of breast: Secondary | ICD-10-CM

## 2023-07-14 NOTE — Progress Notes (Signed)
 Cardiology Office Note:    Date:  07/19/2023   ID:  Miranda [name], DOB 03-May-1945, MRN [id number]  PCP:  Zilphia Hilt, Charyl Coppersmith, MD   [facility] Providers Cardiologist:  Matheus Spiker [place], MD Electrophysiologist:  Will Cortland Ding, MD     Referring MD: Zilphia Hilt, [name]   Chief Complaint  Patient presents with   Atrial Fibrillation    History of Present Illness:    Miranda [name] is a 78 y.o. female with a hx of hypertension, hyperlipidemia, OSA on CPAP, DM 2, and atrial fibrillation.  Myoview  in March 2016 showed EF 69%, normal perfusion and wall motion.  She was diagnosed with acute PE in February 2018 and was treated with 42-month course of Xarelto .  High-resolution CT in February 2018 revealed interstitial lung disease as well as aortic atherosclerosis and two-vessel CAD.  Echocardiogram on 05/02/2016 showed EF 60 to 65%, grade 1 DD, moderate LAE.  Repeat CTA of chest in July 2019 showed resolution of PE.  She had atrial fibrillation while getting ready for colonoscopy in July 2019.  She spontaneously converted in the emergency room.  She was restarted on Xarelto  and metoprolol .  EF was normal on echocardiogram.  Myoview  was normal.  She was hospitalized in January 2023 with chest pain shortness of breath and found to have acute renal failure and felt to be dehydrated.  She was given IV fluid.  Echocardiogram was normal.  She also had hypokalemia and hypocalcemia.  She is currently on Multaq  to 400 mg twice a day for her Afib   She was  seen in the hospital  on 08/14/2022.  During that time she was being seen and evaluated for paroxysmal atrial fibrillation.   She was rate controlled.  Her heart rate was noted to be in the 30s-50s.  Her beta-blocker was discontinued.  She was continued on Multaq  and anticoagulation.   She was also noted to have chest pressure.  This was not felt to be related to cardiac issues.  No ischemic changes were noted on EKG.  Troponins were negative.      On follow up today she is doing well from a CV standpoint. Denies any chest pain or dyspnea. Tires easily. Notes some palpitations but not sustained. Pulse today initially 107 but slowed to 60 with rest. Did fall and injure her achilles tendon and is now in a boot.   Past Medical History:  Diagnosis Date   Anemia    Anxiety    Arthritis    Asthma    Atrial fibrillation (HCC)    CHF (congestive heart failure) (HCC)    pt. unsure- but thinks she was hosp. for CHF- 2002   Chronic kidney disease    [facility]   Clotting disorder (HCC)    blood clots in lungs/PE pulmonary embolism   Colon polyps    Complication of anesthesia    states requires a lot med. to put her to sleep    DDD (degenerative disc disease) 09/17/2011   Depression    "sometimes "   DM (diabetes mellitus) (HCC)    Family history of anesthesia complication    Sister had difficulty waking up from anesthesia   GERD (gastroesophageal reflux disease)    Glaucoma    bilateral, pt. admits that she is noncompliant to eye gtts.    Hemorrhoids    Hyperlipidemia    Hypertension    had stress, echo- 2006 /w [place], Cardiac Cath, per pt. 2002, echo repeated  2012- wnl    IPF (idiopathic pulmonary fibrosis) (HCC)    Low back pain    Shortness of breath    Sleep apnea    uses c-pap- q night recently    Past Surgical History:  Procedure Laterality Date   ABDOMINAL HYSTERECTOMY     ectopic, fibroids   ANTERIOR CERVICAL DECOMP/DISCECTOMY FUSION N/A 09/11/2020   Procedure: Anterior Cervical Decompression Fusion Cervical four-five, Cervical five-six;  Surgeon: Van Gelinas, MD;  Location: [facility];  Service: Neurosurgery;  Laterality: N/A;   arm surgery Right    CARDIAC CATHETERIZATION     CATARACT EXTRACTION, BILATERAL     cataracts removed bilateral- ?IOL   COLONOSCOPY     remote   FISSURECTOMY  10/08/2011   Procedure: FISSURECTOMY;  Surgeon: Lockie Rima, MD;  Location: [facility] OR;  Service: General;   Laterality: N/A;   FLEXIBLE SIGMOIDOSCOPY  02/25/2011   Procedure: FLEXIBLE SIGMOIDOSCOPY;  Surgeon: Claudette Cue, MD;  Location: [facility] ENDOSCOPY;  Service: Endoscopy;  Laterality: N/A;   FOOT SURGERY     bilat, heel spurs- screw in R foot    HEMORRHOID SURGERY  10/08/2011   Procedure: HEMORRHOIDECTOMY;  Surgeon: Lockie Rima, MD;  Location: [facility] OR;  Service: General;  Laterality: N/A;  External    REVERSE SHOULDER ARTHROPLASTY Left 08/13/2022   Procedure: REVERSE SHOULDER ARTHROPLASTY;  Surgeon: Ellard Gunning, MD;  Location: [facility] ORS;  Service: Orthopedics;  Laterality: Left;    ROTATOR CUFF REPAIR Right    SPHINCTEROTOMY  10/08/2011   Procedure: SPHINCTEROTOMY;  Surgeon: Lockie Rima, MD;  Location: [facility] OR;  Service: General;  Laterality: N/A;    Current Medications: Current Meds  Medication Sig   acetaminophen  (TYLENOL ) 325 MG tablet Take 650 mg by mouth every 6 (six) hours as needed for moderate pain.   albuterol  (VENTOLIN  HFA) 108 (90 Base) MCG/ACT inhaler Inhale 1 puff into the lungs every 6 (six) hours as needed.   ALPRAZolam  (XANAX ) 0.25 MG tablet Take 1 tablet by mouth three times daily as needed   amLODipine  (NORVASC ) 5 MG tablet Take 1 tablet by mouth once daily   atorvastatin  (LIPITOR ) 80 MG tablet Take 1 tablet (80 mg total) by mouth every evening.   benzonatate  (TESSALON  PERLES) 100 MG capsule 1-2 capsules up to twice daily as needed for cough.   Continuous Glucose Sensor (FREESTYLE LIBRE 2 SENSOR) MISC USE AS DIRECTED   cyclobenzaprine  (FLEXERIL ) 10 MG tablet Take 1 tablet (10 mg total) by mouth 3 (three) times daily as needed for muscle spasms.   docusate sodium  (COLACE) 100 MG capsule Take 1 capsule (100 mg total) by mouth 2 (two) times daily.   dronedarone  (MULTAQ ) 400 MG tablet Take 1 tablet (400 mg total) by mouth 2 (two) times daily with a meal.   ezetimibe  (ZETIA ) 10 MG tablet Take 1 tablet (10 mg total) by mouth daily.   hydrochlorothiazide  (HYDRODIURIL ) 25 MG  tablet Take 1 tablet by mouth once daily   hydrochlorothiazide  (HYDRODIURIL ) 25 MG tablet Take 1 tablet (25 mg total) by mouth daily.   HYDROcodone -acetaminophen  (NORCO) 10-325 MG tablet Take 1 tablet by mouth every 6 (six) hours as needed.   hydrocortisone  (ANUSOL -HC) 2.5 % rectal cream Place 1 Application rectally 2 (two) times daily.   lactulose  (CHRONULAC ) 10 GM/15ML solution Take 30 g by mouth 2 (two) times daily as needed.   lactulose , encephalopathy, (ENULOSE ) 10 GM/15ML SOLN take BY MOUTH three times daily AS NEEDED FOR mild constipation   linaclotide  (  LINZESS ) 145 MCG CAPS capsule TAKE ONE CAPSULE BY MOUTH BEFORE BREAKFAST   losartan  (COZAAR ) 100 MG tablet Take 1 tablet by mouth in the evening   nitroGLYCERIN (NITRODUR - DOSED IN MG/24 HR) 0.2 mg/hr patch Place 0.2 mg onto the skin daily.   ondansetron  (ZOFRAN ) 4 MG tablet Take 1 tablet (4 mg total) by mouth every 8 (eight) hours as needed for nausea or vomiting.   Oxycodone  HCl 10 MG TABS Take 10 mg by mouth 4 (four) times daily.   Pirfenidone  801 MG TABS Take 1 tablet (801 mg total) by mouth 3 (three) times daily.   rivaroxaban  (XARELTO ) 20 MG TABS tablet TAKE 1 TABLET BY MOUTH EVERY DAY WITH SUPPER     Allergies:   Fish oil, Other, Penicillins, Pneumococcal vaccines, Cashew nut oil, Nitrofurantoin  macrocrystal, Peanut oil, Aspirin, Bactrim  [sulfamethoxazole -trimethoprim ], Ciprofloxacin , Ibuprofen, Influenza vaccines, Ivp dye [iodinated contrast media], Latex, Macrobid  [nitrofurantoin  monohydrate macrocrystals], and Shellfish allergy   Social History   Socioeconomic History   Marital status: Widowed    Spouse name: Not on file   Number of children: 4   Years of education: Not on file   Highest education level: Not on file  Occupational History   Occupation: retired Scientist, research (medical): UNEMPLOYED  Tobacco Use   Smoking status: Never    Passive exposure: Never   Smokeless tobacco: Never   Tobacco comments:    Never smoked  04/22/23  Vaping Use   Vaping status: Never Used  Substance and Sexual Activity   Alcohol use: No    Alcohol/week: 0.0 standard drinks of alcohol   Drug use: No   Sexual activity: Never    Comment: hyst  Other Topics Concern   Not on file  Social History Narrative   Not on file   Social Drivers of Health   Financial Resource Strain: Low Risk  (01/09/2022)   Overall Financial Resource Strain (CARDIA)    Difficulty of Paying Living Expenses: Not very hard  Food Insecurity: No Food Insecurity (08/13/2022)   Hunger Vital Sign    Worried About Running Out of Food in the Last Year: Never true    Ran Out of Food in the Last Year: Never true  Transportation Needs: No Transportation Needs (08/13/2022)   PRAPARE - Administrator, Civil Service (Medical): No    Lack of Transportation (Non-Medical): No  Physical Activity: Not on file  Stress: Not on file  Social Connections: Not on file     Family History: The patient's family history includes Arthritis/Rheumatoid in her sister; Asthma in her sister; Breast cancer in her mother and sister; COPD in her sister; Colon cancer in her brother and brother; Emphysema in her sister; Heart attack in her mother; Heart disease in her mother; Lung cancer in her sister. There is no history of Anesthesia problems, Esophageal cancer, Rectal cancer, or Stomach cancer.  ROS:   Please see the history of present illness.     All other systems reviewed and are negative.  EKGs/Labs/Other Studies Reviewed:    The following studies were reviewed today:  Echo 04/14/2021  1. Left ventricular ejection fraction, by estimation, is 55 to 60%. The  left ventricle has normal function. The left ventricle has no regional  wall motion abnormalities. Left ventricular diastolic parameters are  consistent with Grade I diastolic  dysfunction (impaired relaxation).   2. Right ventricular systolic function is normal. The right ventricular  size is normal. There  is normal pulmonary  artery systolic pressure.   3. The mitral valve is normal in structure. No evidence of mitral valve  regurgitation. No evidence of mitral stenosis.   4. The aortic valve is normal in structure. Aortic valve regurgitation is  not visualized. Aortic valve sclerosis/calcification is present, without  any evidence of aortic stenosis.   5. The inferior vena cava is normal in size with greater than 50%  respiratory variability, suggesting right atrial pressure of 3 mmHg.   Comparison(s): A prior study was performed on 10/19/2017. No significant  change from prior study.   EKG:  EKG is not ordered today.   Recent Labs: 08/05/2022: Hemoglobin 12.8; Platelets 176 07/07/2023: ALT 19; BUN 16; Creatinine, Ser 0.97; Potassium 3.8; Pro B Natriuretic peptide (BNP) 180.0; Sodium 137  Recent Lipid Panel    Component Value Date/Time   CHOL 145 02/19/2022 1404   CHOL 167 11/26/2017 0908   TRIG 60.0 02/19/2022 1404   HDL 72.60 02/19/2022 1404   HDL 61 11/26/2017 0908   CHOLHDL 2 02/19/2022 1404   VLDL 12.0 02/19/2022 1404   LDLCALC 60 02/19/2022 1404   LDLCALC 90 12/01/2019 1025   LDLDIRECT 152.8 03/30/2011 0859     Risk Assessment/Calculations:    CHA2DS2-VASc Score = 6   This indicates a 9.7% annual risk of stroke. The patient's score is based upon: CHF History: 0 HTN History: 1 Diabetes History: 1 Stroke History: 0 Vascular Disease History: 1 Age Score: 2 Gender Score: 1           Physical Exam:    VS:  BP (!) 140/60 (BP Location: Left Arm, Cuff Size: Normal)   Pulse (!) 107   Ht 5\' 5"  (1.651 m)   Wt 172 lb 4.8 oz (78.2 kg)   SpO2 97%   BMI 28.67 kg/m     Wt Readings from Last 3 Encounters:  07/19/23 172 lb 4.8 oz (78.2 kg)  07/07/23 173 lb (78.5 kg)  07/07/23 173 lb (78.5 kg)     GEN:  Well nourished, well developed in no acute distress HEENT: Normal NECK: No JVD; No carotid bruits LYMPHATICS: No lymphadenopathy CARDIAC: RRR, no murmurs, rubs,  gallops RESPIRATORY:  Clear to auscultation without rales, wheezing or rhonchi  ABDOMEN: Soft, non-tender, non-distended MUSCULOSKELETAL:  No edema; No deformity  SKIN: Warm and dry NEUROLOGIC:  Alert and oriented x 3 PSYCHIATRIC:  Normal affect   ASSESSMENT:    1. Paroxysmal atrial fibrillation (HCC)   2. Primary hypertension   3. Coronary artery disease involving native coronary artery of native heart without angina pectoris   4. Hyperlipidemia LDL goal <70       PLAN:    In order of problems listed above:  PAF: well controlled on Multaq . On Xarelto  for anticoagulation.  Told her to be sure and take Multaq  with food. Follow up in 6 months with me or APP  Hypertension: Blood pressure is well controlled on amlodipine , losartan  and HCT. Chemistries are OK.  Will monitor on current therapy.  Hyperlipidemia: On Zetia  and lipitor . Will update lipid panel today  DM2: Managed by primary care provider  5.   Coronary artery calcification - no significant angina. Continue to focus on risk factor modification.            Medication Adjustments/Labs and Tests Ordered: Current medicines are reviewed at length with the patient today.  Concerns regarding medicines are outlined above.  Orders Placed This Encounter  Procedures   Lipid panel   No orders of  the defined types were placed in this encounter.   Patient Instructions  Medication Instructions:   *If you need a refill on your cardiac medications before your next appointment, please call your pharmacy*  Lab Work:  If you have labs (blood work) drawn today and your tests are completely normal, you will receive your results only by: MyChart Message (if you have MyChart) OR A paper copy in the mail If you have any lab test that is abnormal or we need to change your treatment, we will call you to review the results.  Testing/Procedures:   Follow-Up: At [facility], you and your health needs are our priority.   As part of our continuing mission to provide you with exceptional heart care, our providers are all part of one team.  This team includes your primary Cardiologist (physician) and Advanced Practice Providers or APPs (Physician Assistants and Nurse Practitioners) who all work together to provide you with the care you need, when you need it.  Your next appointment:   6 month(s)  Provider:       We recommend signing up for the patient portal called "MyChart".  Sign up information is provided on this After Visit Summary.  MyChart is used to connect with patients for Virtual Visits (Telemedicine).  Patients are able to view lab/test results, encounter notes, upcoming appointments, etc.  Non-urgent messages can be sent to your provider as well.   To learn more about what you can do with MyChart, go to [email or web address].         Signed, Diogo Anne [place], MD  07/19/2023 8:12 AM    [facility]

## 2023-07-15 DIAGNOSIS — M7662 Achilles tendinitis, left leg: Secondary | ICD-10-CM | POA: Diagnosis not present

## 2023-07-19 ENCOUNTER — Encounter: Payer: Self-pay | Admitting: Cardiology

## 2023-07-19 ENCOUNTER — Ambulatory Visit: Payer: PPO | Attending: Cardiology | Admitting: Cardiology

## 2023-07-19 VITALS — BP 140/60 | HR 107 | Ht 65.0 in | Wt 172.3 lb

## 2023-07-19 DIAGNOSIS — I10 Essential (primary) hypertension: Secondary | ICD-10-CM

## 2023-07-19 DIAGNOSIS — E785 Hyperlipidemia, unspecified: Secondary | ICD-10-CM

## 2023-07-19 DIAGNOSIS — I2510 Atherosclerotic heart disease of native coronary artery without angina pectoris: Secondary | ICD-10-CM

## 2023-07-19 DIAGNOSIS — I480 Paroxysmal atrial fibrillation: Secondary | ICD-10-CM | POA: Diagnosis not present

## 2023-07-19 NOTE — Patient Instructions (Signed)
 Medication Instructions:  Continue same medications *If you need a refill on your cardiac medications before your next appointment, please call your pharmacy*  Lab Work: Lipid panel today  Testing/Procedures: None ordered  Follow-Up: At [facility], you and your health needs are our priority.  As part of our continuing mission to provide you with exceptional heart care, our providers are all part of one team.  This team includes your primary Cardiologist (physician) and Advanced Practice Providers or APPs (Physician Assistants and Nurse Practitioners) who all work together to provide you with the care you need, when you need it.  Your next appointment:  6 months    Call  in July to schedule Nov appointment     Provider:  PA   We recommend signing up for the patient portal called "MyChart".  Sign up information is provided on this After Visit Summary.  MyChart is used to connect with patients for Virtual Visits (Telemedicine).  Patients are able to view lab/test results, encounter notes, upcoming appointments, etc.  Non-urgent messages can be sent to your provider as well.   To learn more about what you can do with MyChart, go to [email or web address].

## 2023-07-20 LAB — LIPID PANEL
Chol/HDL Ratio: 2.3 ratio (ref 0.0–4.4)
Cholesterol, Total: 138 mg/dL (ref 100–199)
HDL: 61 mg/dL (ref >39)
LDL Chol Calc (NIH): 67 mg/dL (ref 0–99)
Triglycerides: 45 mg/dL (ref 0–149)
VLDL Cholesterol Cal: 10 mg/dL (ref 5–40)

## 2023-07-21 DIAGNOSIS — G4733 Obstructive sleep apnea (adult) (pediatric): Secondary | ICD-10-CM | POA: Diagnosis not present

## 2023-07-21 DIAGNOSIS — M5412 Radiculopathy, cervical region: Secondary | ICD-10-CM | POA: Diagnosis not present

## 2023-07-22 DIAGNOSIS — M7662 Achilles tendinitis, left leg: Secondary | ICD-10-CM | POA: Diagnosis not present

## 2023-07-24 ENCOUNTER — Other Ambulatory Visit: Payer: Self-pay | Admitting: Internal Medicine

## 2023-07-24 DIAGNOSIS — F419 Anxiety disorder, unspecified: Secondary | ICD-10-CM

## 2023-07-29 ENCOUNTER — Encounter: Payer: Self-pay | Admitting: Internal Medicine

## 2023-07-29 ENCOUNTER — Ambulatory Visit: Admitting: Internal Medicine

## 2023-07-29 VITALS — BP 120/78 | HR 78 | Temp 97.6°F | Wt 170.1 lb

## 2023-07-29 DIAGNOSIS — K644 Residual hemorrhoidal skin tags: Secondary | ICD-10-CM

## 2023-07-29 DIAGNOSIS — E119 Type 2 diabetes mellitus without complications: Secondary | ICD-10-CM

## 2023-07-29 DIAGNOSIS — R300 Dysuria: Secondary | ICD-10-CM | POA: Diagnosis not present

## 2023-07-29 LAB — POC URINALSYSI DIPSTICK (AUTOMATED)
Bilirubin, UA: NEGATIVE
Blood, UA: POSITIVE
Glucose, UA: NEGATIVE
Ketones, UA: NEGATIVE
Leukocytes, UA: NEGATIVE
Nitrite, UA: NEGATIVE
Protein, UA: POSITIVE — AB
Spec Grav, UA: 1.025 (ref 1.010–1.025)
Urobilinogen, UA: 1 U/dL
pH, UA: 6 (ref 5.0–8.0)

## 2023-07-29 LAB — URINALYSIS
Bilirubin Urine: NEGATIVE
Ketones, ur: NEGATIVE
Leukocytes,Ua: NEGATIVE
Nitrite: NEGATIVE
Specific Gravity, Urine: 1.025 (ref 1.000–1.030)
Total Protein, Urine: NEGATIVE
Urine Glucose: NEGATIVE
Urobilinogen, UA: 1 (ref 0.0–1.0)
pH: 6 (ref 5.0–8.0)

## 2023-07-29 LAB — MICROALBUMIN / CREATININE URINE RATIO
Creatinine,U: 177.6 mg/dL
Microalb Creat Ratio: 5.2 mg/g (ref 0.0–30.0)
Microalb, Ur: 0.9 mg/dL (ref 0.0–1.9)

## 2023-07-29 NOTE — Progress Notes (Signed)
 Established Patient Office Visit     CC/Reason for Visit: "Burning with urination"  HPI: Miranda [name] is a 78 y.o. female who is coming in today for the above mentioned reasons.  This problem has been going on now for a few weeks.  Upon further questioning it burns around her anal verge when she urinates not in the vaginal area.  She has a history of external hemorrhoids and has had several banded in the past.  She has Anusol  cream but has not been using it.   Past Medical/Surgical History: Past Medical History:  Diagnosis Date   Anemia    Anxiety    Arthritis    Asthma    Atrial fibrillation (HCC)    CHF (congestive heart failure) (HCC)    pt. unsure- but thinks she was hosp. for CHF- 2002   [facility]   Clotting disorder (HCC)    blood clots in lungs/PE pulmonary embolism   Colon polyps    Complication of anesthesia    states requires a lot med. to put her to sleep    DDD (degenerative disc disease) 09/17/2011   Depression    "sometimes "   DM (diabetes mellitus) (HCC)    Family history of anesthesia complication    Sister had difficulty waking up from anesthesia   GERD (gastroesophageal reflux disease)    Glaucoma    bilateral, pt. admits that she is noncompliant to eye gtts.    Hemorrhoids    Hyperlipidemia    Hypertension    had stress, echo- 2006 /w [place], Cardiac Cath, per pt. 2002, echo repeated 2012- wnl    IPF (idiopathic pulmonary fibrosis) (HCC)    Low back pain    Shortness of breath    Sleep apnea    uses c-pap- q night recently    Past Surgical History:  Procedure Laterality Date   ABDOMINAL HYSTERECTOMY     ectopic, fibroids   ANTERIOR CERVICAL DECOMP/DISCECTOMY FUSION N/A 09/11/2020   Procedure: Anterior Cervical Decompression Fusion Cervical four-five, Cervical five-six;  Surgeon: Van Gelinas, MD;  Location: [facility] OR;  Service: Neurosurgery;  Laterality: N/A;   arm surgery Right    CARDIAC  CATHETERIZATION     CATARACT EXTRACTION, BILATERAL     cataracts removed bilateral- ?IOL   COLONOSCOPY     remote   FISSURECTOMY  10/08/2011   Procedure: FISSURECTOMY;  Surgeon: Lockie Rima, MD;  Location: [facility] OR;  Service: General;  Laterality: N/A;   FLEXIBLE SIGMOIDOSCOPY  02/25/2011   Procedure: FLEXIBLE SIGMOIDOSCOPY;  Surgeon: Claudette Cue, MD;  Location: [facility];  Service: Endoscopy;  Laterality: N/A;   FOOT SURGERY     bilat, heel spurs- screw in R foot    HEMORRHOID SURGERY  10/08/2011   Procedure: HEMORRHOIDECTOMY;  Surgeon: Lockie Rima, MD;  Location: [facility] OR;  Service: General;  Laterality: N/A;  External    REVERSE SHOULDER ARTHROPLASTY Left 08/13/2022   Procedure: REVERSE SHOULDER ARTHROPLASTY;  Surgeon: Ellard Gunning, MD;  Location: [facility] ORS;  Service: Orthopedics;  Laterality: Left;    ROTATOR CUFF REPAIR Right    SPHINCTEROTOMY  10/08/2011   Procedure: SPHINCTEROTOMY;  Surgeon: Lockie Rima, MD;  Location: [facility] OR;  Service: General;  Laterality: N/A;    Social History:  reports that she has never smoked. She has never been exposed to tobacco smoke. She has never used smokeless tobacco. She reports that she does not drink alcohol  and does not use drugs.  Allergies: Allergies  Allergen Reactions   Fish Oil Anaphylaxis   Other Hives, Shortness Of Breath and Swelling    Allergic to cashew nuts and peanut oil.   Penicillins Anaphylaxis, Hives and Swelling    Has patient had a PCN reaction causing immediate rash, facial/tongue/throat swelling, SOB or lightheadedness with hypotension: Face swelling and hives started first, then swelling of the throat  Has patient had a PCN reaction causing severe rash involving mucus membranes or skin necrosis: Yes  Has patient had a PCN reaction that required hospitalization: No  Has patient had a PCN reaction occurring within the last 10 years: Yes  If all of the above answers are "NO", then may proceed with Cephalospor    Pneumococcal Vaccines Nausea And Vomiting   Cashew Nut Oil    Nitrofurantoin  Macrocrystal Hives   Peanut Oil    Aspirin Itching and Rash   Bactrim  [Sulfamethoxazole -Trimethoprim ] Hives, Itching and Rash   Ciprofloxacin  Hives, Itching and Rash   Ibuprofen Rash   Influenza Vaccines Hives   Ivp Dye [Iodinated Contrast Media] Hives, Itching and Rash    Gives benadryl  to counteract symptoms   Latex Rash   Macrobid  [Nitrofurantoin  Monohydrate Macrocrystals] Hives   Shellfish Allergy Hives    Patient also allergic to seafood    Family History:  Family History  Problem Relation Age of Onset   Heart attack Mother    Heart disease Mother    Breast cancer Mother    Emphysema Sister    Breast cancer Sister    Arthritis/Rheumatoid Sister    Asthma Sister    Lung cancer Sister    COPD Sister    Colon cancer Brother    Colon cancer Brother    Anesthesia problems Neg Hx    Esophageal cancer Neg Hx    Rectal cancer Neg Hx    Stomach cancer Neg Hx      Current Outpatient Medications:    acetaminophen  (TYLENOL ) 325 MG tablet, Take 650 mg by mouth every 6 (six) hours as needed for moderate pain., Disp: , Rfl:    albuterol  (VENTOLIN  HFA) 108 (90 Base) MCG/ACT inhaler, Inhale 1 puff into the lungs every 6 (six) hours as needed., Disp: 18 g, Rfl: 1   ALPRAZolam  (XANAX ) 0.25 MG tablet, Take 1 tablet by mouth three times daily as needed, Disp: 90 tablet, Rfl: 0   amLODipine  (NORVASC ) 5 MG tablet, Take 1 tablet by mouth once daily, Disp: 90 tablet, Rfl: 1   atorvastatin  (LIPITOR ) 80 MG tablet, Take 1 tablet (80 mg total) by mouth every evening., Disp: 90 tablet, Rfl: 0   benzonatate  (TESSALON  PERLES) 100 MG capsule, 1-2 capsules up to twice daily as needed for cough., Disp: 30 capsule, Rfl: 0   Continuous Glucose Sensor (FREESTYLE LIBRE 2 SENSOR) MISC, USE AS DIRECTED, Disp: 2 each, Rfl: 3   cyclobenzaprine  (FLEXERIL ) 10 MG tablet, Take 1 tablet (10 mg total) by mouth 3 (three) times daily as  needed for muscle spasms., Disp: 30 tablet, Rfl: 1   docusate sodium  (COLACE) 100 MG capsule, Take 1 capsule (100 mg total) by mouth 2 (two) times daily., Disp: 60 capsule, Rfl: 2   dronedarone  (MULTAQ ) 400 MG tablet, Take 1 tablet (400 mg total) by mouth 2 (two) times daily with a meal., Disp: 48 tablet, Rfl: 0   ezetimibe  (ZETIA ) 10 MG tablet, Take 1 tablet (10 mg total) by mouth daily., Disp: 90 tablet, Rfl: 1   hydrochlorothiazide  (HYDRODIURIL )  25 MG tablet, Take 1 tablet by mouth once daily, Disp: 90 tablet, Rfl: 0   hydrochlorothiazide  (HYDRODIURIL ) 25 MG tablet, Take 1 tablet (25 mg total) by mouth daily., Disp: 90 tablet, Rfl: 0   HYDROcodone -acetaminophen  (NORCO) 10-325 MG tablet, Take 1 tablet by mouth every 6 (six) hours as needed., Disp: 30 tablet, Rfl: 0   hydrocortisone  (ANUSOL -HC) 2.5 % rectal cream, Place 1 Application rectally 2 (two) times daily., Disp: 30 g, Rfl: 2   lactulose  (CHRONULAC ) 10 GM/15ML solution, Take 30 g by mouth 2 (two) times daily as needed., Disp: , Rfl:    lactulose , encephalopathy, (ENULOSE ) 10 GM/15ML SOLN, take 30mLs BY MOUTH three times daily AS NEEDED FOR mild constipation, Disp: 473 mL, Rfl: 2   linaclotide  (LINZESS ) 145 MCG CAPS capsule, TAKE ONE CAPSULE BY MOUTH BEFORE BREAKFAST, Disp: 30 capsule, Rfl: 2   losartan  (COZAAR ) 100 MG tablet, Take 1 tablet by mouth in the evening, Disp: 90 tablet, Rfl: 0   nitroGLYCERIN (NITRODUR - DOSED IN MG/24 HR) 0.2 mg/hr patch, Place 0.2 mg onto the skin daily., Disp: , Rfl:    ondansetron  (ZOFRAN ) 4 MG tablet, Take 1 tablet (4 mg total) by mouth every 8 (eight) hours as needed for nausea or vomiting., Disp: 10 tablet, Rfl: 0   Oxycodone  HCl 10 MG TABS, Take 10 mg by mouth 4 (four) times daily., Disp: , Rfl:    Pirfenidone  801 MG TABS, Take 1 tablet (801 mg total) by mouth 3 (three) times daily., Disp: 270 tablet, Rfl: 1   rivaroxaban  (XARELTO ) 20 MG TABS tablet, TAKE 1 TABLET BY MOUTH EVERY DAY WITH SUPPER, Disp: 30  tablet, Rfl: 5  Review of Systems:  Negative unless indicated in HPI.   Physical Exam: Vitals:   07/29/23 1029  BP: 120/78  Pulse: 78  Temp: 97.6 F (36.4 C)  TempSrc: Oral  SpO2: 98%  Weight: 170 lb 1.6 oz (77.2 kg)    Body mass index is 28.31 kg/m.    Impression and Plan:  Dysuria -     POCT Urinalysis Dipstick (Automated) -     Urinalysis; Future -     Urine Culture; Future  Diabetes mellitus without complication (HCC) -     Microalbumin / creatinine urine ratio; Future  External hemorrhoids  -In office urine dipstick is negative.  Will nonetheless send for culture to be on the safe side. - Suspect the anal verge burning with urination is due to a flareup of her hemorrhoids.  Have advised witch hazel wipes and hydrocortisone  cream.   Time spent:22 minutes reviewing chart, interviewing and examining patient and formulating plan of care.     Marguerita Shih, MD [name]

## 2023-07-31 LAB — URINE CULTURE
MICRO NUMBER:: 16460266
SPECIMEN QUALITY:: ADEQUATE

## 2023-08-03 ENCOUNTER — Other Ambulatory Visit: Payer: Self-pay | Admitting: Internal Medicine

## 2023-08-03 DIAGNOSIS — E785 Hyperlipidemia, unspecified: Secondary | ICD-10-CM

## 2023-08-03 DIAGNOSIS — I480 Paroxysmal atrial fibrillation: Secondary | ICD-10-CM

## 2023-08-05 DIAGNOSIS — M7662 Achilles tendinitis, left leg: Secondary | ICD-10-CM | POA: Diagnosis not present

## 2023-08-11 DIAGNOSIS — M7662 Achilles tendinitis, left leg: Secondary | ICD-10-CM | POA: Diagnosis not present

## 2023-08-23 DIAGNOSIS — M7662 Achilles tendinitis, left leg: Secondary | ICD-10-CM | POA: Diagnosis not present

## 2023-08-25 DIAGNOSIS — M51362 Other intervertebral disc degeneration, lumbar region with discogenic back pain and lower extremity pain: Secondary | ICD-10-CM | POA: Diagnosis not present

## 2023-08-25 DIAGNOSIS — M5412 Radiculopathy, cervical region: Secondary | ICD-10-CM | POA: Diagnosis not present

## 2023-09-01 ENCOUNTER — Ambulatory Visit

## 2023-09-01 ENCOUNTER — Encounter: Payer: Self-pay | Admitting: Family Medicine

## 2023-09-01 ENCOUNTER — Ambulatory Visit: Payer: Self-pay | Admitting: Family Medicine

## 2023-09-01 ENCOUNTER — Ambulatory Visit (INDEPENDENT_AMBULATORY_CARE_PROVIDER_SITE_OTHER): Admitting: Family Medicine

## 2023-09-01 VITALS — BP 120/60 | HR 64 | Temp 97.9°F | Resp 16 | Ht 65.0 in | Wt 167.0 lb

## 2023-09-01 DIAGNOSIS — K5900 Constipation, unspecified: Secondary | ICD-10-CM

## 2023-09-01 DIAGNOSIS — R109 Unspecified abdominal pain: Secondary | ICD-10-CM | POA: Diagnosis not present

## 2023-09-01 DIAGNOSIS — R1084 Generalized abdominal pain: Secondary | ICD-10-CM

## 2023-09-01 LAB — BASIC METABOLIC PANEL WITH GFR
BUN: 14 mg/dL (ref 6–23)
CO2: 34 meq/L — ABNORMAL HIGH (ref 19–32)
Calcium: 9.9 mg/dL (ref 8.4–10.5)
Chloride: 102 meq/L (ref 96–112)
Creatinine, Ser: 0.91 mg/dL (ref 0.40–1.20)
GFR: 61.1 mL/min (ref >60.00)
Glucose, Bld: 94 mg/dL (ref 70–99)
Potassium: 3.8 meq/L (ref 3.5–5.1)
Sodium: 141 meq/L (ref 135–145)

## 2023-09-01 LAB — CBC
HCT: 37.3 % (ref 36.0–46.0)
Hemoglobin: 12.5 g/dL (ref 12.0–15.0)
MCHC: 33.4 g/dL (ref 30.0–36.0)
MCV: 85.4 fl (ref 78.0–100.0)
Platelets: 176 10*3/uL (ref 150.0–400.0)
RBC: 4.37 Mil/uL (ref 3.87–5.11)
RDW: 14.5 % (ref 11.5–15.5)
WBC: 4.7 10*3/uL (ref 4.0–10.5)

## 2023-09-01 LAB — TSH: TSH: 1.22 u[IU]/mL (ref 0.35–5.50)

## 2023-09-01 MED ORDER — PRUCALOPRIDE SUCCINATE 2 MG PO TABS
1.0000 | ORAL_TABLET | Freq: Every day | ORAL | 0 refills | Status: DC
Start: 2023-09-01 — End: 2023-09-27

## 2023-09-01 MED ORDER — BISACODYL 10 MG RE SUPP
10.0000 mg | Freq: Every day | RECTAL | 0 refills | Status: AC | PRN
Start: 2023-09-01 — End: ?

## 2023-09-01 NOTE — Progress Notes (Signed)
 ACUTE VISIT Chief Complaint  Patient presents with   Constipation    Last bowel movement on Friday. Has tried multiple OTC remedies without relief. Has also tried Linzess .    HPI: [name] is a 78 y.o. female with a PMHx significant for Dyslipidemia; HTN; Hx of Colonic Polyps; External Hemorrhoids; Diabetes Mellitus; Hypercoagulable State Due To Paroxysmal Atrial Fibrillation, w/ RVR; Vitamin D  Deficiency; Hx of Pulmonary Embolus (PE); and constipation who is here today complaining of difficulty w/ bowel movements as described above.  Says that she felt she was constipated on Friday, so took 1 bottle Magnesium citrate and had watery stools, but since then has not had a bowel movement despite feeling like she needs to.  Has been constipated for 4 days, which is the longest she's gone without a bowel movement before.  Endorses also having LLQ abdominal pain left lower quadrant, which while this has been an issue for months it has worsened recently and having non radiated back pain. States that when straining she has urinary leakage, nausea, and belching.  Medications for constipation: Linzess  145 mg - took 2 last night, passed gas but no bowel movement, Lactulose  30 g twice daily as needed - not providing relief, Magnesium Citrate, Miralax , and Dulcolax  Last colonoscopy 2022, has not seen GI regarding constipation, but has seen Urology.  Says that she drinks plenty of water.   She also reports right frontal headache. Does have personal hx of headaches, but usually resolves after taking a Tylenol , which she says has not been providing relief for her current headache Has been prescribed Hydrocodone  every 6-8 hours as needed for severe pain, tries not to take this more than twice daily and will try to manage pain with Tylenol . Was prescribed this for pain following surgery and multiple injuries. Also was prescribed Oxycodone , which she has not been taking.  Review of Systems   Constitutional:  Positive for fatigue. Negative for activity change, appetite change, chills and fever.  HENT:  Negative for sore throat and trouble swallowing.   Respiratory:  Negative for cough, shortness of breath and wheezing.   Cardiovascular:  Negative for chest pain, palpitations and leg swelling.  Gastrointestinal:  Negative for anal bleeding, blood in stool and vomiting.  Endocrine: Negative for cold intolerance and heat intolerance.  Musculoskeletal:  Positive for arthralgias.  Skin:  Negative for rash.  Neurological:  Negative for syncope, facial asymmetry and weakness.  Psychiatric/Behavioral:  Negative for confusion and hallucinations.   See other pertinent positives and negatives in HPI.  Current Outpatient Medications on File Prior to Visit  Medication Sig Dispense Refill   acetaminophen  (TYLENOL ) 325 MG tablet Take 650 mg by mouth every 6 (six) hours as needed for moderate pain.     albuterol  (VENTOLIN  HFA) 108 (90 Base) MCG/ACT inhaler Inhale 1 puff into the lungs every 6 (six) hours as needed. 18 g 1   ALPRAZolam  (XANAX ) 0.25 MG tablet Take 1 tablet by mouth three times daily as needed 90 tablet 0   amLODipine  (NORVASC ) 5 MG tablet Take 1 tablet by mouth once daily 90 tablet 1   atorvastatin  (LIPITOR ) 80 MG tablet Take 1 tablet by mouth in the evening 90 tablet 0   benzonatate  (TESSALON  PERLES) 100 MG capsule 1-2 capsules up to twice daily as needed for cough. 30 capsule 0   Continuous Glucose Sensor (FREESTYLE LIBRE 2 SENSOR) MISC USE AS DIRECTED 2 each 3   cyclobenzaprine  (FLEXERIL ) 10 MG tablet Take 1 tablet (  10 mg total) by mouth 3 (three) times daily as needed for muscle spasms. 30 tablet 1   docusate sodium  (COLACE) 100 MG capsule Take 1 capsule (100 mg total) by mouth 2 (two) times daily. 60 capsule 2   ezetimibe  (ZETIA ) 10 MG tablet Take 1 tablet (10 mg total) by mouth daily. 90 tablet 1   hydrochlorothiazide  (HYDRODIURIL ) 25 MG tablet Take 1 tablet by mouth once  daily 90 tablet 0   hydrochlorothiazide  (HYDRODIURIL ) 25 MG tablet Take 1 tablet (25 mg total) by mouth daily. 90 tablet 0   HYDROcodone -acetaminophen  (NORCO) 10-325 MG tablet Take 1 tablet by mouth every 6 (six) hours as needed. 30 tablet 0   hydrocortisone  (ANUSOL -HC) 2.5 % rectal cream Place 1 Application rectally 2 (two) times daily. 30 g 2   lactulose  (CHRONULAC ) 10 GM/15ML solution Take 30 g by mouth 2 (two) times daily as needed.     lactulose , encephalopathy, (ENULOSE ) 10 GM/15ML SOLN take 30mLs BY MOUTH three times daily AS NEEDED FOR mild constipation 473 mL 2   losartan  (COZAAR ) 100 MG tablet Take 1 tablet by mouth in the evening 90 tablet 0   MULTAQ  400 MG tablet TAKE 1 TABLET BY MOUTH TWICE DAILY WITH A MEAL 60 tablet 0   nitroGLYCERIN (NITRODUR - DOSED IN MG/24 HR) 0.2 mg/hr patch Place 0.2 mg onto the skin daily.     ondansetron  (ZOFRAN ) 4 MG tablet Take 1 tablet (4 mg total) by mouth every 8 (eight) hours as needed for nausea or vomiting. 10 tablet 0   Pirfenidone  801 MG TABS Take 1 tablet (801 mg total) by mouth 3 (three) times daily. 270 tablet 1   XARELTO  20 MG TABS tablet TAKE 1 TABLET BY MOUTH ONCE DAILY IN THE EVENING 90 tablet 0   No current facility-administered medications on file prior to visit.    Past Medical History:  Diagnosis Date   Anemia    Anxiety    Arthritis    Asthma    Atrial fibrillation (HCC)    CHF (congestive heart failure) (HCC)    pt. unsure- but thinks she was hosp. for CHF- 2002   [facility]   Clotting disorder (HCC)    blood clots in lungs/PE pulmonary embolism   Colon polyps    Complication of anesthesia    states requires a lot med. to put her to sleep    DDD (degenerative disc disease) 09/17/2011   Depression    sometimes    DM (diabetes mellitus) (HCC)    Family history of anesthesia complication    Sister had difficulty waking up from anesthesia   GERD (gastroesophageal reflux disease)     Glaucoma    bilateral, pt. admits that she is noncompliant to eye gtts.    Hemorrhoids    Hyperlipidemia    Hypertension    had stress, echo- 2006 /w [place], Cardiac Cath, per pt. 2002, echo repeated 2012- wnl    IPF (idiopathic pulmonary fibrosis) (HCC)    Low back pain    Shortness of breath    Sleep apnea    uses c-pap- q night recently   Allergies  Allergen Reactions   Fish Oil Anaphylaxis   Other Hives, Shortness Of Breath and Swelling    Allergic to cashew nuts and peanut oil.   Penicillins Anaphylaxis, Hives and Swelling    Has patient had a PCN reaction causing immediate rash, facial/tongue/throat swelling, SOB or lightheadedness with hypotension: Face  swelling and hives started first, then swelling of the throat  Has patient had a PCN reaction causing severe rash involving mucus membranes or skin necrosis: Yes  Has patient had a PCN reaction that required hospitalization: No  Has patient had a PCN reaction occurring within the last 10 years: Yes  If all of the above answers are NO, then may proceed with Cephalospor   Pneumococcal Vaccines Nausea And Vomiting   Cashew Nut Oil    Nitrofurantoin  Macrocrystal Hives   Peanut Oil    Aspirin Itching and Rash   Bactrim  [Sulfamethoxazole -Trimethoprim ] Hives, Itching and Rash   Ciprofloxacin  Hives, Itching and Rash   Ibuprofen Rash   Influenza Vaccines Hives   Ivp Dye [Iodinated Contrast Media] Hives, Itching and Rash    Gives benadryl  to counteract symptoms   Latex Rash   Macrobid  [Nitrofurantoin  Monohydrate Macrocrystals] Hives   Shellfish Allergy Hives    Patient also allergic to seafood   Social History   Socioeconomic History   Marital status: Widowed    Spouse name: Not on file   Number of children: 4   Years of education: Not on file   Highest education level: Not on file  Occupational History   Occupation: retired Scientist, research (medical): UNEMPLOYED  Tobacco Use   Smoking status: Never    Passive exposure: Never    Smokeless tobacco: Never   Tobacco comments:    Never smoked 04/22/23  Vaping Use   Vaping status: Never Used  Substance and Sexual Activity   Alcohol use: No    Alcohol/week: 0.0 standard drinks of alcohol   Drug use: No   Sexual activity: Never    Comment: hyst  Other Topics Concern   Not on file  Social History Narrative   Not on file   Social Drivers of Health   Financial Resource Strain: Low Risk  (01/09/2022)   Overall Financial Resource Strain (CARDIA)    Difficulty of Paying Living Expenses: Not very hard  Food Insecurity: No Food Insecurity (08/13/2022)   Hunger Vital Sign    Worried About Running Out of Food in the Last Year: Never true    Ran Out of Food in the Last Year: Never true  Transportation Needs: No Transportation Needs (08/13/2022)   PRAPARE - Administrator, Civil Service (Medical): No    Lack of Transportation (Non-Medical): No  Physical Activity: Not on file  Stress: Not on file  Social Connections: Not on file   Vitals:   09/01/23 1301  BP: 120/60  Pulse: 64  Resp: 16  Temp: 97.9 F (36.6 C)  SpO2: 97%   Body mass index is 27.79 kg/m.  Physical Exam Vitals and nursing note reviewed.  Constitutional:      General: She is not in acute distress.    Appearance: She is well-developed.  HENT:     Head: Normocephalic and atraumatic.     Mouth/Throat:     Mouth: Mucous membranes are moist.     Pharynx: Oropharynx is clear.   Eyes:     Conjunctiva/sclera: Conjunctivae normal.    Cardiovascular:     Rate and Rhythm: Normal rate and regular rhythm.     Pulses:          Dorsalis pedis pulses are 2+ on the right side and 2+ on the left side.     Heart sounds: No murmur heard. Pulmonary:     Effort: Pulmonary effort is normal. No respiratory distress.  Breath sounds: Normal breath sounds.  Abdominal:     Palpations: Abdomen is soft. There is no hepatomegaly or mass.     Tenderness: There is generalized abdominal  tenderness. There is no right CVA tenderness, left CVA tenderness, guarding or rebound.   Musculoskeletal:     Lumbar back: No tenderness.  Lymphadenopathy:     Cervical: No cervical adenopathy.   Skin:    General: Skin is warm.     Findings: No erythema or rash.   Neurological:     General: No focal deficit present.     Mental Status: She is alert and oriented to person, place, and time.     Cranial Nerves: No cranial nerve deficit.     Gait: Gait normal.   Psychiatric:        Mood and Affect: Affect normal. Mood is anxious.   ASSESSMENT AND PLAN:  Ms. Horn was seen today for Obstipation/Constipation  Lab Results  Component Value Date   NA 141 09/01/2023   CL 102 09/01/2023   K 3.8 09/01/2023   CO2 34 (H) 09/01/2023   BUN 14 09/01/2023   CREATININE 0.91 09/01/2023   GFR 61.10 09/01/2023   CALCIUM  9.9 09/01/2023   PHOS 2.7 09/14/2011   ALBUMIN 4.3 07/07/2023   GLUCOSE 94 09/01/2023   Lab Results  Component Value Date   WBC 4.7 09/01/2023   HGB 12.5 09/01/2023   HCT 37.3 09/01/2023   MCV 85.4 09/01/2023   PLT 176.0 09/01/2023   Lab Results  Component Value Date   TSH 1.22 09/01/2023   Constipation, unspecified constipation type This is a chronic problem but seems to be getting worse. She is passing gas, so I do not think she needs to go to the ED at this time. OTC medications have not work. Currently on chronic opioid treatment, which is most likely aggravating problem. Linzess  has not helped, so discontinued. She agrees with trying prucalopride sulfate 2 mg daily as needed. MiraLAX  daily at bedtime. Recommend bisacodyl  suppositories daily at bedtime. Continue adequate hydration and fiber intake. She was clearly instructed about warning signs. Further recommendation will be given according to lab results and imaging.  -     Bisacodyl ; Place 1 suppository (10 mg total) rectally daily as needed for moderate constipation.  Dispense: 12 suppository; Refill:  0 -     Basic metabolic panel with GFR; Future -     TSH; Future -     Prucalopride Succinate; Take 1 tablet (2 mg total) by mouth daily at 12 noon.  Dispense: 30 tablet; Refill: 0 -     DG Abd 2 Views; Future  Generalized abdominal pain She reports the problem has been going on for a few months.  Most likely aggravated by constipation. Today no signs of acute abdomen. No signs of obstruction at this time. Further recommendation will be given according to abdomen x-ray. I see normal gas distribution and moderate amount of stool in colon. She was clearly instructed about warning signs. Recommend following with PCP in 2 weeks, before if needed.  -     Basic metabolic panel with GFR; Future -     CBC; Future -     DG Abd 2 Views; Future  Return in about 2 weeks (around 09/15/2023) for constipation with PCP.  I, Odelia Bender, acting as a scribe for Alexzandria Massman [place], MD., have documented all relevant documentation on the behalf of Brad Lieurance [place], MD, as directed by  Nyra Anspaugh [place], MD while in  the presence of Tenna Lacko [place], MD.   I, Xin Klawitter [place], MD, have reviewed all documentation for this visit. The documentation on 09/01/23 for the exam, diagnosis, procedures, and orders are all accurate and complete.  Alezander Dimaano G. [place], MD  [facility]. [place] office.

## 2023-09-01 NOTE — Patient Instructions (Addendum)
 A few things to remember from today's visit:  Constipation, unspecified constipation type - Plan: Basic metabolic panel with GFR, TSH, Prucalopride Succinate (MOTEGRITY) 2 MG TABS, DG Abd 2 Views  Generalized abdominal pain - Plan: Basic metabolic panel with GFR, CBC, DG Abd 2 Views Miralax  daily. Bisacodyl  suppositories daily at bedtime. Continue adequate hydration and fiber intake. If you cannot pass gas or abdominal pain gets worse or nausea/vomiting you need to go to the ED.  If you need refills for medications you take chronically, please call your pharmacy. Do not use My Chart to request refills or for acute issues that need immediate attention. If you send a my chart message, it may take a few days to be addressed, specially if I am not in the office.  Please be sure medication list is accurate. If a new problem present, please set up appointment sooner than planned today.

## 2023-09-06 ENCOUNTER — Other Ambulatory Visit: Payer: Self-pay | Admitting: Cardiology

## 2023-09-06 ENCOUNTER — Other Ambulatory Visit: Payer: Self-pay | Admitting: Internal Medicine

## 2023-09-06 DIAGNOSIS — I10 Essential (primary) hypertension: Secondary | ICD-10-CM

## 2023-09-06 DIAGNOSIS — I480 Paroxysmal atrial fibrillation: Secondary | ICD-10-CM

## 2023-09-06 DIAGNOSIS — R3915 Urgency of urination: Secondary | ICD-10-CM | POA: Diagnosis not present

## 2023-09-08 ENCOUNTER — Other Ambulatory Visit: Payer: Self-pay | Admitting: Internal Medicine

## 2023-09-08 ENCOUNTER — Other Ambulatory Visit (HOSPITAL_COMMUNITY): Payer: Self-pay

## 2023-09-08 ENCOUNTER — Telehealth: Payer: Self-pay

## 2023-09-08 DIAGNOSIS — G8929 Other chronic pain: Secondary | ICD-10-CM

## 2023-09-08 DIAGNOSIS — I480 Paroxysmal atrial fibrillation: Secondary | ICD-10-CM

## 2023-09-08 DIAGNOSIS — I10 Essential (primary) hypertension: Secondary | ICD-10-CM

## 2023-09-08 MED ORDER — MULTAQ 400 MG PO TABS
400.0000 mg | ORAL_TABLET | Freq: Two times a day (BID) | ORAL | 0 refills | Status: DC
Start: 2023-09-08 — End: 2023-10-01

## 2023-09-08 MED ORDER — CYCLOBENZAPRINE HCL 10 MG PO TABS: 10.0000 mg | ORAL_TABLET | Freq: Three times a day (TID) | ORAL | 1 refills | Status: DC | PRN

## 2023-09-08 MED ORDER — LOSARTAN POTASSIUM 100 MG PO TABS: 100.0000 mg | ORAL_TABLET | Freq: Every evening | ORAL | 0 refills | Status: DC

## 2023-09-08 MED ORDER — HYDROCHLOROTHIAZIDE 25 MG PO TABS: 25.0000 mg | ORAL_TABLET | Freq: Every day | ORAL | 0 refills | Status: DC

## 2023-09-08 NOTE — Telephone Encounter (Signed)
 Pharmacy Patient Advocate Encounter   Received notification from CoverMyMeds that prior authorization for Cyclobenzaprine  HCl 10MG  tablets  is required/requested.   Insurance verification completed.   The patient is insured through [employer] ADVANTAGE/RX ADVANCE .   Per test claim: PA required; PA started via CoverMyMeds. KEY [id number] . Waiting for clinical questions to populate.

## 2023-09-08 NOTE — Telephone Encounter (Signed)
 Copied from CRM [id number]. Topic: Clinical - Medication Refill >> Sep 08, 2023 10:41 AM Thersia C wrote: Medication: cyclobenzaprine  (FLEXERIL ) 10 MG tablet MULTAQ  400 MG tablet -- completely out of this  losartan  (COZAAR ) 100 MG tablet  hydrochlorothiazide  (HYDRODIURIL ) 25 MG tablet  Has the patient contacted their pharmacy? Yes (Agent: If no, request that the patient contact the pharmacy for the refill. If patient does not wish to contact the pharmacy document the reason why and proceed with request.) (Agent: If yes, when and what did the pharmacy advise?)  This is the patient's preferred pharmacy:  [facility] [address] ([address]), [place] - [address] [place] ([place]) [place] [address] Phone: [phone] Fax: [phone]    Is this the correct pharmacy for this prescription? Yes If no, delete pharmacy and type the correct one.   Has the prescription been filled recently? No  Is the patient out of the medication? Yes  Has the patient been seen for an appointment in the last year OR does the patient have an upcoming appointment? Yes  Can we respond through MyChart? Yes  Agent: Please be advised that Rx refills may take up to 3 business days. We ask that you follow-up with your pharmacy.

## 2023-09-09 NOTE — Telephone Encounter (Signed)
 PLEASE BE ADVISED Clinical questions have been answered and PA submitted.TO PLAN. PA currently Pending.

## 2023-09-10 NOTE — Telephone Encounter (Signed)
 Pharmacy Patient Advocate Encounter  Received notification from [facility] AD[employer]ANT[employer]/RX [employer]VANCE that Prior Authorization for Cyclobenzaprine  HCl 10MG  tablets  has been APPROVED from 09/09/2023 to 10/07/2023 SEE OUTCOME BELOW   Approved on June 26 by [employer] Team [employer] 2017 26-JUN-25:24-JUL-25 Cyclobenzaprine  HCl 10MG  OR TABS Quantity:30;  PA #/Case ID/Reference #: [id number]

## 2023-09-22 ENCOUNTER — Telehealth: Payer: Self-pay

## 2023-09-22 DIAGNOSIS — J84112 Idiopathic pulmonary fibrosis: Secondary | ICD-10-CM

## 2023-09-22 NOTE — Telephone Encounter (Signed)
 Copied from CRM [id number]. Topic: Clinical - Medication Question >> Sep 20, 2023  9:13 AM Leila C wrote: Reason for CRM: Patient [phone] states this Pirfenidone  801 MG TABS last refill before seeing Dr. Theophilus and wants to make sure she'll be able to have another refill if it runs out before seeing Dr. Theophilus 12/01/23.   Please advise pt requesting Pirfenidone  refill

## 2023-09-23 MED ORDER — PIRFENIDONE 801 MG PO TABS: 1.0000 | ORAL_TABLET | Freq: Three times a day (TID) | ORAL | 0 refills | Status: DC

## 2023-09-23 NOTE — Telephone Encounter (Signed)
 Ok to refill pirfenidone . CCing the pharmacy team.

## 2023-09-23 NOTE — Telephone Encounter (Signed)
 Refill sent for ESBRIET  to [facility] for Esbriet : [phone]  Dose: 801mg  three times daily  Last OV: 07/07/2023 Provider: Dr. Theophilus Pertinent labs: LFTs 07/07/23  Next OV: 12/01/2023  Sherry Pennant, PharmD, MPH, BCPS Clinical Pharmacist (Rheumatology and Pulmonology)

## 2023-09-27 ENCOUNTER — Ambulatory Visit (INDEPENDENT_AMBULATORY_CARE_PROVIDER_SITE_OTHER): Admitting: Internal Medicine

## 2023-09-27 ENCOUNTER — Encounter: Payer: Self-pay | Admitting: Internal Medicine

## 2023-09-27 VITALS — BP 110/70 | HR 59 | Temp 98.0°F | Wt 165.0 lb

## 2023-09-27 DIAGNOSIS — E785 Hyperlipidemia, unspecified: Secondary | ICD-10-CM | POA: Diagnosis not present

## 2023-09-27 DIAGNOSIS — M545 Low back pain, unspecified: Secondary | ICD-10-CM

## 2023-09-27 DIAGNOSIS — I10 Essential (primary) hypertension: Secondary | ICD-10-CM | POA: Diagnosis not present

## 2023-09-27 DIAGNOSIS — F419 Anxiety disorder, unspecified: Secondary | ICD-10-CM

## 2023-09-27 DIAGNOSIS — K5900 Constipation, unspecified: Secondary | ICD-10-CM | POA: Diagnosis not present

## 2023-09-27 DIAGNOSIS — G8929 Other chronic pain: Secondary | ICD-10-CM

## 2023-09-27 MED ORDER — PRUCALOPRIDE SUCCINATE 2 MG PO TABS: 1.0000 | ORAL_TABLET | Freq: Every day | ORAL | 1 refills | Status: DC

## 2023-09-27 MED ORDER — LACTULOSE ENCEPHALOPATHY 10 GM/15ML PO SOLN: ORAL | 2 refills | Status: DC

## 2023-09-27 MED ORDER — CYCLOBENZAPRINE HCL 10 MG PO TABS
10.0000 mg | ORAL_TABLET | Freq: Three times a day (TID) | ORAL | 1 refills | Status: AC | PRN
Start: 2023-09-27 — End: ?

## 2023-09-27 MED ORDER — DOCUSATE SODIUM 100 MG PO CAPS: 100.0000 mg | ORAL_CAPSULE | Freq: Two times a day (BID) | ORAL | 2 refills | Status: AC

## 2023-09-27 MED ORDER — AMLODIPINE BESYLATE 5 MG PO TABS: 5.0000 mg | ORAL_TABLET | Freq: Every day | ORAL | 1 refills | Status: AC

## 2023-09-27 MED ORDER — EZETIMIBE 10 MG PO TABS: 10.0000 mg | ORAL_TABLET | Freq: Every day | ORAL | 1 refills | Status: AC

## 2023-09-27 MED ORDER — ALPRAZOLAM 0.25 MG PO TABS
0.2500 mg | ORAL_TABLET | Freq: Three times a day (TID) | ORAL | 1 refills | Status: DC | PRN
Start: 2023-09-27 — End: 2024-01-03

## 2023-09-27 NOTE — Progress Notes (Signed)
 Established Patient Office Visit     CC/Reason for Visit: Medication refills, anal irritation  HPI: Miranda [name] is a 78 y.o. female who is coming in today for the above mentioned reasons.  She needs most of her medications refilled today.  She suffers from chronic constipation.  Recently saw another in office provider and was prescribed Motegrity .  Ever since she started taking it in addition to her lactulose , MiraLAX  and Dulcolax is having 1-2 loose bowel movements a day.  This is causing some anal irritation and aggravation of her hemorrhoids.   Past Medical/Surgical History: Past Medical History:  Diagnosis Date   Anemia    Anxiety    Arthritis    Asthma    Atrial fibrillation (HCC)    CHF (congestive heart failure) (HCC)    pt. unsure- but thinks she was hosp. for CHF- 2002   [facility] kidney disease    [facility]   Clotting disorder (HCC)    blood clots in lungs/PE pulmonary embolism   Colon polyps    Complication of anesthesia    states requires a lot med. to put her to sleep    DDD (degenerative disc disease) 09/17/2011   Depression    sometimes    DM (diabetes mellitus) (HCC)    Family history of anesthesia complication    Sister had difficulty waking up from anesthesia   GERD (gastroesophageal reflux disease)    Glaucoma    bilateral, pt. admits that she is noncompliant to eye gtts.    Hemorrhoids    Hyperlipidemia    Hypertension    had stress, echo- 2006 /w [place], Cardiac Cath, per pt. 2002, echo repeated 2012- wnl    IPF (idiopathic pulmonary fibrosis) (HCC)    Low back pain    Shortness of breath    Sleep apnea    uses c-pap- q night recently    Past Surgical History:  Procedure Laterality Date   ABDOMINAL HYSTERECTOMY     ectopic, fibroids   ANTERIOR CERVICAL DECOMP/DISCECTOMY FUSION N/A 09/11/2020   Procedure: Anterior Cervical Decompression Fusion Cervical four-five, Cervical five-six;  Surgeon: Debby Dorn MATSU, MD;   Location: [facility] OR;  Service: Neurosurgery;  Laterality: N/A;   arm surgery Right    CARDIAC CATHETERIZATION     CATARACT EXTRACTION, BILATERAL     cataracts removed bilateral- ?IOL   COLONOSCOPY     remote   FISSURECTOMY  10/08/2011   Procedure: FISSURECTOMY;  Surgeon: Jina Nephew, MD;  Location: [facility] OR;  Service: General;  Laterality: N/A;   FLEXIBLE SIGMOIDOSCOPY  02/25/2011   Procedure: FLEXIBLE SIGMOIDOSCOPY;  Surgeon: [name] JONETTA Aho, MD;  Location: [facility] ENDOSCOPY;  Service: Endoscopy;  Laterality: N/A;   FOOT SURGERY     bilat, heel spurs- screw in R foot    HEMORRHOID SURGERY  10/08/2011   Procedure: HEMORRHOIDECTOMY;  Surgeon: Jina Nephew, MD;  Location: [facility] OR;  Service: General;  Laterality: N/A;  External    REVERSE SHOULDER ARTHROPLASTY Left 08/13/2022   Procedure: REVERSE SHOULDER ARTHROPLASTY;  Surgeon: Melita Drivers, MD;  Location: [facility] ORS;  Service: Orthopedics;  Laterality: Left;    ROTATOR CUFF REPAIR Right    SPHINCTEROTOMY  10/08/2011   Procedure: SPHINCTEROTOMY;  Surgeon: Jina Nephew, MD;  Location: [facility] OR;  Service: General;  Laterality: N/A;    Social History:  reports that she has never smoked. She has never been exposed to tobacco smoke. She has never used smokeless tobacco. She reports that  she does not drink alcohol and does not use drugs.  Allergies: Allergies  Allergen Reactions   Fish Oil Anaphylaxis   Other Hives, Shortness Of Breath and Swelling    Allergic to cashew nuts and peanut oil.   Penicillins Anaphylaxis, Hives and Swelling    Has patient had a PCN reaction causing immediate rash, facial/tongue/throat swelling, SOB or lightheadedness with hypotension: Face swelling and hives started first, then swelling of the throat  Has patient had a PCN reaction causing severe rash involving mucus membranes or skin necrosis: Yes  Has patient had a PCN reaction that required hospitalization: No  Has patient had a PCN reaction occurring within the last 10  years: Yes  If all of the above answers are NO, then may proceed with Cephalospor   Pneumococcal Vaccines Nausea And Vomiting   Cashew Nut Oil    Nitrofurantoin  Macrocrystal Hives   Peanut Oil    Aspirin Itching and Rash   Bactrim  [Sulfamethoxazole -Trimethoprim ] Hives, Itching and Rash   Ciprofloxacin  Hives, Itching and Rash   Ibuprofen Rash   Influenza Vaccines Hives   Ivp Dye [Iodinated Contrast Media] Hives, Itching and Rash    Gives benadryl  to counteract symptoms   Latex Rash   Macrobid  [Nitrofurantoin  Monohydrate Macrocrystals] Hives   Shellfish Allergy Hives    Patient also allergic to seafood    Family History:  Family History  Problem Relation Age of Onset   Heart attack Mother    Heart disease Mother    Breast cancer Mother    Emphysema Sister    Breast cancer Sister    Arthritis/Rheumatoid Sister    Asthma Sister    Lung cancer Sister    COPD Sister    Colon cancer Brother    Colon cancer Brother    Anesthesia problems Neg Hx    Esophageal cancer Neg Hx    Rectal cancer Neg Hx    Stomach cancer Neg Hx      Current Outpatient Medications:    acetaminophen  (TYLENOL ) 325 MG tablet, Take 650 mg by mouth every 6 (six) hours as needed for moderate pain., Disp: , Rfl:    albuterol  (VENTOLIN  HFA) 108 (90 Base) MCG/ACT inhaler, Inhale 1 puff into the lungs every 6 (six) hours as needed., Disp: 18 g, Rfl: 1   atorvastatin  (LIPITOR ) 80 MG tablet, Take 1 tablet by mouth in the evening, Disp: 90 tablet, Rfl: 0   Continuous Glucose Sensor (FREESTYLE LIBRE 2 SENSOR) MISC, USE AS DIRECTED, Disp: 2 each, Rfl: 3   dronedarone  (MULTAQ ) 400 MG tablet, Take 1 tablet (400 mg total) by mouth 2 (two) times daily with a meal., Disp: 60 tablet, Rfl: 0   hydrochlorothiazide  (HYDRODIURIL ) 25 MG tablet, Take 1 tablet by mouth once daily, Disp: 90 tablet, Rfl: 0   HYDROcodone -acetaminophen  (NORCO) 10-325 MG tablet, Take 1 tablet by mouth every 6 (six) hours as needed., Disp: 30 tablet,  Rfl: 0   hydrocortisone  (ANUSOL -HC) 2.5 % rectal cream, Place 1 Application rectally 2 (two) times daily., Disp: 30 g, Rfl: 2   lactulose  (CHRONULAC ) 10 GM/15ML solution, Take 30 g by mouth 2 (two) times daily as needed., Disp: , Rfl:    losartan  (COZAAR ) 100 MG tablet, Take 1 tablet (100 mg total) by mouth every evening., Disp: 90 tablet, Rfl: 0   nitroGLYCERIN (NITRODUR - DOSED IN MG/24 HR) 0.2 mg/hr patch, Place 0.2 mg onto the skin daily., Disp: , Rfl:    ondansetron  (ZOFRAN ) 4 MG tablet, Take 1 tablet (  4 mg total) by mouth every 8 (eight) hours as needed for nausea or vomiting., Disp: 10 tablet, Rfl: 0   Pirfenidone  801 MG TABS, Take 1 tablet (801 mg total) by mouth 3 (three) times daily., Disp: 270 tablet, Rfl: 0   XARELTO  20 MG TABS tablet, TAKE 1 TABLET BY MOUTH ONCE DAILY IN THE EVENING, Disp: 90 tablet, Rfl: 0   ALPRAZolam  (XANAX ) 0.25 MG tablet, Take 1 tablet (0.25 mg total) by mouth 3 (three) times daily as needed., Disp: 90 tablet, Rfl: 1   amLODipine  (NORVASC ) 5 MG tablet, Take 1 tablet (5 mg total) by mouth daily., Disp: 90 tablet, Rfl: 1   benzonatate  (TESSALON  PERLES) 100 MG capsule, 1-2 capsules up to twice daily as needed for cough., Disp: 30 capsule, Rfl: 0   bisacodyl  (BISACODYL  LAXATIVE) 10 MG suppository, Place 1 suppository (10 mg total) rectally daily as needed for moderate constipation., Disp: 12 suppository, Rfl: 0   cyclobenzaprine  (FLEXERIL ) 10 MG tablet, Take 1 tablet (10 mg total) by mouth 3 (three) times daily as needed for muscle spasms., Disp: 30 tablet, Rfl: 1   docusate sodium  (COLACE) 100 MG capsule, Take 1 capsule (100 mg total) by mouth 2 (two) times daily., Disp: 60 capsule, Rfl: 2   ezetimibe  (ZETIA ) 10 MG tablet, Take 1 tablet (10 mg total) by mouth daily., Disp: 90 tablet, Rfl: 1   hydrochlorothiazide  (HYDRODIURIL ) 25 MG tablet, Take 1 tablet (25 mg total) by mouth daily., Disp: 90 tablet, Rfl: 0   lactulose , encephalopathy, (ENULOSE ) 10 GM/15ML SOLN, take  30mLs BY MOUTH three times daily AS NEEDED FOR mild constipation, Disp: 473 mL, Rfl: 2   Prucalopride Succinate  (MOTEGRITY ) 2 MG TABS, Take 1 tablet (2 mg total) by mouth daily at 12 noon., Disp: 30 tablet, Rfl: 1  Review of Systems:  Negative unless indicated in HPI.   Physical Exam: Vitals:   09/27/23 1033  BP: 110/70  Pulse: (!) 59  Temp: 98 F (36.7 C)  TempSrc: Oral  SpO2: 100%  Weight: 165 lb (74.8 kg)    Body mass index is 27.46 kg/m.    Impression and Plan:  Dyslipidemia -     Ezetimibe ; Take 1 tablet (10 mg total) by mouth daily.  Dispense: 90 tablet; Refill: 1  Anxiety -     ALPRAZolam ; Take 1 tablet (0.25 mg total) by mouth 3 (three) times daily as needed.  Dispense: 90 tablet; Refill: 1  Primary hypertension -     amLODIPine  Besylate; Take 1 tablet (5 mg total) by mouth daily.  Dispense: 90 tablet; Refill: 1  Chronic low back pain without sciatica, unspecified back pain laterality -     Cyclobenzaprine  HCl; Take 1 tablet (10 mg total) by mouth 3 (three) times daily as needed for muscle spasms.  Dispense: 30 tablet; Refill: 1  Constipation, unspecified constipation type -     Docusate Sodium ; Take 1 capsule (100 mg total) by mouth 2 (two) times daily.  Dispense: 60 capsule; Refill: 2 -     Lactulose  Encephalopathy; take BY MOUTH three times daily AS NEEDED FOR mild constipation  Dispense: 473 mL; Refill: 2 -     Prucalopride Succinate ; Take 1 tablet (2 mg total) by mouth daily at 12 noon.  Dispense: 30 tablet; Refill: 1  - Chronic medications have been refilled. - Suspect she might have a small anal fissure or flareup of her hemorrhoids.  Have advised her to wipe with flushable wipes as opposed to toilet paper.  Can also use hemorrhoid cream as needed.   Time spent:22 minutes reviewing chart, interviewing and examining patient and formulating plan of care.     Tully Theophilus Andrews, MD [facility]

## 2023-09-29 ENCOUNTER — Telehealth: Payer: Self-pay

## 2023-09-29 NOTE — Telephone Encounter (Signed)
..     Pre-operative Risk Assessment    Patient Name: Miranda [name]  DOB: [birth year] MRN: [id number]   Date of last office visit: 07/19/23 Date of next office visit: NONE   Request for Surgical Clearance    Procedure:  BILATERAL L5 NERVE BLOCK  Date of Surgery:  Clearance TBD                                Surgeon:  MELODIE Socks Group or Practice Name:  [facility] Phone number:  [phone] Fax number:  [phone]   Type of Clearance Requested:   - Medical  - Pharmacy:  Hold Rivaroxaban  (Xarelto ) WOULD LIKE TO HOLD FOR THREE DAYS   Type of Anesthesia:  Not Indicated   Additional requests/questions:    Bonney Teressa Rumalda Ronal   09/29/2023, 1:59 PM

## 2023-09-29 NOTE — Telephone Encounter (Signed)
 Pharmacy please advise on holding Xarelto  prior to  BILATERAL L5 NERVE BLOCK  scheduled for TBD. Thank you.

## 2023-10-01 ENCOUNTER — Other Ambulatory Visit: Payer: Self-pay | Admitting: Internal Medicine

## 2023-10-01 ENCOUNTER — Telehealth: Payer: Self-pay

## 2023-10-01 DIAGNOSIS — I480 Paroxysmal atrial fibrillation: Secondary | ICD-10-CM

## 2023-10-01 NOTE — Telephone Encounter (Signed)
 Preop televisit now scheduled, med rec and consent done.

## 2023-10-01 NOTE — Telephone Encounter (Signed)
 Patient with diagnosis of A Fib + PE on Xarelto  for anticoagulation.    Procedure: BILATERAL L5 NERVE BLOCK  Date of procedure: TBD   CHA2DS2-VASc Score = 6  This indicates a 9.7% annual risk of stroke. The patient's score is based upon: CHF History: 0 HTN History: 1 Diabetes History: 1 Stroke History: 0 Vascular Disease History: 1 Age Score: 2 Gender Score: 1    CrCl 61 ml/min Platelet count 176K   Per office protocol, patient can hold Xarelto  for 3 days prior to procedure.      **This guidance is not considered finalized until pre-operative APP has relayed final recommendations.**

## 2023-10-01 NOTE — Telephone Encounter (Signed)
  Patient Consent for Virtual Visit        Miranda [name] has provided verbal consent on 10/01/2023 for a virtual visit (video or telephone).   CONSENT FOR VIRTUAL VISIT FOR:  Miranda [name]  By participating in this virtual visit I agree to the following:  I hereby voluntarily request, consent and authorize [facility] and its employed or contracted physicians, physician assistants, nurse practitioners or other licensed health care professionals (the Practitioner), to provide me with telemedicine health care services (the "Services) as deemed necessary by the treating Practitioner. I acknowledge and consent to receive the Services by the Practitioner via telemedicine. I understand that the telemedicine visit will involve communicating with the Practitioner through live audiovisual communication technology and the disclosure of certain medical information by electronic transmission. I acknowledge that I have been given the opportunity to request an in-person assessment or other available alternative prior to the telemedicine visit and am voluntarily participating in the telemedicine visit.  I understand that I have the right to withhold or withdraw my consent to the use of telemedicine in the course of my care at any time, without affecting my right to future care or treatment, and that the Practitioner or I may terminate the telemedicine visit at any time. I understand that I have the right to inspect all information obtained and/or recorded in the course of the telemedicine visit and may receive copies of available information for a reasonable fee.  I understand that some of the potential risks of receiving the Services via telemedicine include:  Delay or interruption in medical evaluation due to technological equipment failure or disruption; Information transmitted may not be sufficient (e.g. poor resolution of images) to allow for appropriate medical decision making by the  Practitioner; and/or  In rare instances, security protocols could fail, causing a breach of personal health information.  Furthermore, I acknowledge that it is my responsibility to provide information about my medical history, conditions and care that is complete and accurate to the best of my ability. I acknowledge that Practitioner's advice, recommendations, and/or decision may be based on factors not within their control, such as incomplete or inaccurate data provided by me or distortions of diagnostic images or specimens that may result from electronic transmissions. I understand that the practice of medicine is not an exact science and that Practitioner makes no warranties or guarantees regarding treatment outcomes. I acknowledge that a copy of this consent can be made available to me via my patient portal [facility] MyChart), or I can request a printed copy by calling the office of [facility].    I understand that my insurance will be billed for this visit.   I have read or had this consent read to me. I understand the contents of this consent, which adequately explains the benefits and risks of the Services being provided via telemedicine.  I have been provided ample opportunity to ask questions regarding this consent and the Services and have had my questions answered to my satisfaction. I give my informed consent for the services to be provided through the use of telemedicine in my medical care

## 2023-10-01 NOTE — Telephone Encounter (Signed)
   Name: Miranda [name]  DOB: [birth year]  MRN: [id number]  Primary Cardiologist: Peter Swaziland, MD   Preoperative team, please contact this patient and set up a phone call appointment for further preoperative risk assessment. Please obtain consent and complete medication review. Thank you for your help.  I confirm that guidance regarding antiplatelet and oral anticoagulation therapy has been completed and, if necessary, noted below.  Procedure: BILATERAL L5 NERVE BLOCK  Date of procedure: TBD     CHA2DS2-VASc Score = 6  This indicates a 9.7% annual risk of stroke. The patient's score is based upon: CHF History: 0 HTN History: 1 Diabetes History: 1 Stroke History: 0 Vascular Disease History: 1 Age Score: 2 Gender Score: 1     CrCl 61 ml/min Platelet count 176K     Per office protocol, patient can hold Xarelto  for 3 days prior to procedure.      I also confirmed the patient resides in the state of [place] . As per [facility] Medical Board telemedicine laws, the patient must reside in the state in which the provider is licensed.   Artist Pouch, PA-C 10/01/2023, 9:58 AM [facility]

## 2023-10-06 ENCOUNTER — Other Ambulatory Visit (HOSPITAL_COMMUNITY): Payer: Self-pay | Admitting: *Deleted

## 2023-10-06 ENCOUNTER — Ambulatory Visit: Attending: Internal Medicine | Admitting: Cardiology

## 2023-10-06 DIAGNOSIS — I480 Paroxysmal atrial fibrillation: Secondary | ICD-10-CM

## 2023-10-06 DIAGNOSIS — Z01810 Encounter for preprocedural cardiovascular examination: Secondary | ICD-10-CM | POA: Diagnosis not present

## 2023-10-06 MED ORDER — MULTAQ 400 MG PO TABS: 400.0000 mg | ORAL_TABLET | Freq: Two times a day (BID) | ORAL | 3 refills | Status: DC

## 2023-10-06 NOTE — Progress Notes (Signed)
 Virtual Visit via Telephone Note   Because of Miranda [name] co-morbid illnesses, she is at least at moderate risk for complications without adequate follow up.  This format is felt to be most appropriate for this patient at this time.  Due to technical limitations with video connection (technology), today's appointment will be conducted as an audio only telehealth visit, and MAHLANI BERNINGER verbally agreed to proceed in this manner.   All issues noted in this document were discussed and addressed.  No physical exam could be performed with this format.  Evaluation Performed:  Preoperative cardiovascular risk assessment _____________   Date:  10/06/2023   Patient ID:  Miranda [name], DOB [birth year], MRN [id number] Patient Location:  Home Provider location:   Office  Primary Care Provider:  Theophilus Andrews, Tully GRADE, MD Primary Cardiologist:  Peter Swaziland, MD  Chief Complaint / Patient Profile   78 y.o. y/o female with a h/o hypertension, hyperlipidemia, OSA on CPAP, DM 2, coronary artery calcification and PAF who is pending BILATERAL L5 NERVE BLOCK, date 10/12/2023 (self reported) and presents today for telephonic preoperative cardiovascular risk assessment.  History of Present Illness    Miranda [name] is a 78 y.o. female who presents via audio/video conferencing for a telehealth visit today.  Pt was last seen in cardiology clinic on 07/19/2023 by Dr. Swaziland.  At that time TEMILOLUWA RECCHIA was doing well. The patient is now pending procedure as outlined above. Since her last visit, she she reports no acute complaints.  She has been out of her Multaq  for about a week now, has been struggling to get this from her pharmacy.  Has not had any tachycardia, heart rates in the 60s.  Appears that a prescription was just sent in from her PCP though.  Otherwise has poor functional capacity and not very ambulatory but she can complete greater than 4 METS.  Endorses chronic intermittent  fatigue.  They deny chest pain, shortness of breath, lower extremity edema, palpitations, melena, hematuria, hemoptysis, diaphoresis, weakness, presyncope, syncope, orthopnea, and PND.  Past Medical History    Past Medical History:  Diagnosis Date   Anemia    Anxiety    Arthritis    Asthma    Atrial fibrillation (HCC)    CHF (congestive heart failure) (HCC)    pt. unsure- but thinks she was hosp. for CHF- 2002   Chronic kidney disease    [facility]   Clotting disorder (HCC)    blood clots in lungs/PE pulmonary embolism   Colon polyps    Complication of anesthesia    states requires a lot med. to put her to sleep    DDD (degenerative disc disease) 09/17/2011   Depression    sometimes    DM (diabetes mellitus) (HCC)    Family history of anesthesia complication    Sister had difficulty waking up from anesthesia   GERD (gastroesophageal reflux disease)    Glaucoma    bilateral, pt. admits that she is noncompliant to eye gtts.    Hemorrhoids    Hyperlipidemia    Hypertension    had stress, echo- 2006 /w [place], Cardiac Cath, per pt. 2002, echo repeated 2012- wnl    IPF (idiopathic pulmonary fibrosis) (HCC)    Low back pain    Shortness of breath    Sleep apnea    uses c-pap- q night recently   Past Surgical History:  Procedure Laterality Date   ABDOMINAL HYSTERECTOMY  ectopic, fibroids   ANTERIOR CERVICAL DECOMP/DISCECTOMY FUSION N/A 09/11/2020   Procedure: Anterior Cervical Decompression Fusion Cervical four-five, Cervical five-six;  Surgeon: Debby Dorn MATSU, MD;  Location: [facility] OR;  Service: Neurosurgery;  Laterality: N/A;   arm surgery Right    CARDIAC CATHETERIZATION     CATARACT EXTRACTION, BILATERAL     cataracts removed bilateral- ?IOL   COLONOSCOPY     remote   FISSURECTOMY  10/08/2011   Procedure: FISSURECTOMY;  Surgeon: Jina Nephew, MD;  Location: [facility] OR;  Service: General;  Laterality: N/A;   FLEXIBLE SIGMOIDOSCOPY  02/25/2011    Procedure: FLEXIBLE SIGMOIDOSCOPY;  Surgeon: [name] JONETTA Aho, MD;  Location: [facility];  Service: Endoscopy;  Laterality: N/A;   FOOT SURGERY     bilat, heel spurs- screw in R foot    HEMORRHOID SURGERY  10/08/2011   Procedure: HEMORRHOIDECTOMY;  Surgeon: Jina Nephew, MD;  Location: [facility] OR;  Service: General;  Laterality: N/A;  External    REVERSE SHOULDER ARTHROPLASTY Left 08/13/2022   Procedure: REVERSE SHOULDER ARTHROPLASTY;  Surgeon: Melita Drivers, MD;  Location: [facility] ORS;  Service: Orthopedics;  Laterality: Left;    ROTATOR CUFF REPAIR Right    SPHINCTEROTOMY  10/08/2011   Procedure: SPHINCTEROTOMY;  Surgeon: Jina Nephew, MD;  Location: [facility] OR;  Service: General;  Laterality: N/A;    Allergies  Allergies  Allergen Reactions   Fish Oil Anaphylaxis   Other Hives, Shortness Of Breath and Swelling    Allergic to cashew nuts and peanut oil.   Penicillins Anaphylaxis, Hives and Swelling    Has patient had a PCN reaction causing immediate rash, facial/tongue/throat swelling, SOB or lightheadedness with hypotension: Face swelling and hives started first, then swelling of the throat  Has patient had a PCN reaction causing severe rash involving mucus membranes or skin necrosis: Yes  Has patient had a PCN reaction that required hospitalization: No  Has patient had a PCN reaction occurring within the last 10 years: Yes  If all of the above answers are NO, then may proceed with Cephalospor   Pneumococcal Vaccines Nausea And Vomiting   Cashew Nut Oil    Nitrofurantoin  Macrocrystal Hives   Peanut Oil    Aspirin Itching and Rash   Bactrim  [Sulfamethoxazole -Trimethoprim ] Hives, Itching and Rash   Ciprofloxacin  Hives, Itching and Rash   Ibuprofen Rash   Influenza Vaccines Hives   Ivp Dye [Iodinated Contrast Media] Hives, Itching and Rash    Gives benadryl  to counteract symptoms   Latex Rash   Macrobid  [Nitrofurantoin  Monohydrate Macrocrystals] Hives   Shellfish Allergy Hives     Patient also allergic to seafood    Home Medications    Prior to Admission medications   Medication Sig Start Date End Date Taking? Authorizing Provider  acetaminophen  (TYLENOL ) 325 MG tablet Take 650 mg by mouth every 6 (six) hours as needed for moderate pain.    [provider]  albuterol  (VENTOLIN  HFA) 108 (90 Base) MCG/ACT inhaler Inhale 1 puff into the lungs every 6 (six) hours as needed. 06/23/19   Theophilus Andrews, Tully GRADE, MD  ALPRAZolam  (XANAX ) 0.25 MG tablet Take 1 tablet (0.25 mg total) by mouth 3 (three) times daily as needed. 09/27/23   Theophilus Andrews, Tully GRADE, MD  amLODipine  (NORVASC ) 5 MG tablet Take 1 tablet (5 mg total) by mouth daily. 09/27/23   Theophilus Andrews, Tully GRADE, MD  atorvastatin  (LIPITOR ) 80 MG tablet Take 1 tablet by mouth in the evening 08/03/23   Theophilus Andrews, Tully GRADE,  MD  benzonatate  (TESSALON  PERLES) 100 MG capsule 1-2 capsules up to twice daily as needed for cough. 05/20/23   Luke Chiquita SAUNDERS, DO  bisacodyl  (BISACODYL  LAXATIVE) 10 MG suppository Place 1 suppository (10 mg total) rectally daily as needed for moderate constipation. 09/01/23   Swaziland, Betty G, MD  Continuous Glucose Sensor (FREESTYLE LIBRE 2 SENSOR) MISC USE AS DIRECTED 07/05/23   Theophilus Andrews, Tully GRADE, MD  cyclobenzaprine  (FLEXERIL ) 10 MG tablet Take 1 tablet (10 mg total) by mouth 3 (three) times daily as needed for muscle spasms. 09/27/23   Theophilus Andrews, Tully GRADE, MD  docusate sodium  (COLACE) 100 MG capsule Take 1 capsule (100 mg total) by mouth 2 (two) times daily. 09/27/23   Theophilus Andrews, Tully GRADE, MD  ezetimibe  (ZETIA ) 10 MG tablet Take 1 tablet (10 mg total) by mouth daily. 09/27/23   Theophilus Andrews, Tully GRADE, MD  hydrochlorothiazide  (HYDRODIURIL ) 25 MG tablet Take 1 tablet by mouth once daily 03/22/23   Theophilus Andrews, Tully GRADE, MD  hydrochlorothiazide  (HYDRODIURIL ) 25 MG tablet Take 1 tablet (25 mg total) by mouth daily. 09/08/23   Theophilus Andrews, Tully GRADE, MD   HYDROcodone -acetaminophen  (NORCO) 10-325 MG tablet Take 1 tablet by mouth every 6 (six) hours as needed. 08/14/22   Shuford, Randine, PA-C  hydrocortisone  (ANUSOL -HC) 2.5 % rectal cream Place 1 Application rectally 2 (two) times daily. 05/11/23   Theophilus Andrews, Tully GRADE, MD  lactulose  (CHRONULAC ) 10 GM/15ML solution Take 30 g by mouth 2 (two) times daily as needed. 07/05/23   [provider]  lactulose , encephalopathy, (ENULOSE ) 10 GM/15ML SOLN take 30mLs BY MOUTH three times daily AS NEEDED FOR mild constipation 09/27/23   Theophilus Andrews, Tully GRADE, MD  losartan  (COZAAR ) 100 MG tablet Take 1 tablet (100 mg total) by mouth every evening. 09/08/23   Theophilus Andrews, Tully GRADE, MD  MULTAQ  400 MG tablet TAKE 1 TABLET BY MOUTH TWICE DAILY WITH A MEAL 10/01/23   Theophilus Andrews, Tully GRADE, MD  nitroGLYCERIN (NITRODUR - DOSED IN MG/24 HR) 0.2 mg/hr patch Place 0.2 mg onto the skin daily. 05/28/23   [provider]  ondansetron  (ZOFRAN ) 4 MG tablet Take 1 tablet (4 mg total) by mouth every 8 (eight) hours as needed for nausea or vomiting. 08/13/22   Shuford, Randine, PA-C  Pirfenidone  801 MG TABS Take 1 tablet (801 mg total) by mouth 3 (three) times daily. 09/23/23   Mannam, Praveen, MD  Prucalopride Succinate  (MOTEGRITY ) 2 MG TABS Take 1 tablet (2 mg total) by mouth daily at 12 noon. 09/27/23   Theophilus Andrews, Tully GRADE, MD  XARELTO  20 MG TABS tablet TAKE 1 TABLET BY MOUTH ONCE DAILY IN THE EVENING 08/03/23   Theophilus Andrews, Tully GRADE, MD    Physical Exam    Vital Signs:  Hurtis LITTIE Jerry does not have vital signs available for review today.  Given telephonic nature of communication, physical exam is limited. AAOx3. NAD. Normal affect.  Speech and respirations are unlabored.  Accessory Clinical Findings    None  Assessment & Plan    1.  Preoperative Cardiovascular Risk Assessment: According to the Revised Cardiac Risk Index (RCRI), her Perioperative Risk of Major Cardiac Event is (%):  0.4. Her Functional Capacity in METs is: 4.4 according to the Duke Activity Status Index (DASI).  This patient is doing well from a cardiac perspective.  Therefore, based on ACC/AHA guidelines, the patient would be at acceptable risk for the planned procedure without further cardiovascular testing.  The  patient was advised that if she develops new symptoms prior to surgery to contact our office to arrange for a follow-up visit, and she verbalized understanding.  Can hold Xarelto  for 3 days prior to procedure.    2.  PAF- reports that she has been out of her Multaq  for 10 days, 30-day supply was sent to [facility] pharmacy by her PCP.  Will also send message to Afib clinic for further refills.  A copy of this note will be routed to requesting surgeon.  Surgeon:  MELODIE Socks Group or Practice Name:  [facility] Phone number:  [phone] Fax number:  [phone]  Time:   Today, I have spent 10 minutes with the patient with telehealth technology discussing medical history, symptoms, and management plan.     Thom LITTIE Sluder, PA-C  10/06/2023, 10:53 AM

## 2023-10-12 DIAGNOSIS — M5416 Radiculopathy, lumbar region: Secondary | ICD-10-CM | POA: Diagnosis not present

## 2023-10-18 ENCOUNTER — Telehealth: Payer: Self-pay | Admitting: *Deleted

## 2023-10-18 ENCOUNTER — Other Ambulatory Visit: Payer: Self-pay | Admitting: Internal Medicine

## 2023-10-18 MED ORDER — METFORMIN HCL ER 500 MG PO TB24: 500.0000 mg | ORAL_TABLET | Freq: Every day | ORAL | 1 refills | Status: AC

## 2023-10-18 NOTE — Telephone Encounter (Signed)
 Copied from CRM [id number]. Topic: Clinical - Medication Question >> Oct 15, 2023  3:31 PM [name] E wrote: Reason for CRM: Antoinette from [employer] called in requesting a refill for patient's Metformin . Agent could not locate this medication on med list in order to send a refill request over. Antoinette stated to call the patient with any further questions.

## 2023-10-18 NOTE — Telephone Encounter (Signed)
 Spoke to the patient and she states that she only takes it when her glucose increases too much.  Metformin  500 XL one tablet daily.  Okay to refill?

## 2023-10-18 NOTE — Telephone Encounter (Signed)
 Refill sent and patient is aware.

## 2023-10-29 ENCOUNTER — Other Ambulatory Visit: Payer: Self-pay | Admitting: Internal Medicine

## 2023-11-19 ENCOUNTER — Telehealth: Payer: Self-pay | Admitting: Pulmonary Disease

## 2023-11-19 DIAGNOSIS — J84112 Idiopathic pulmonary fibrosis: Secondary | ICD-10-CM

## 2023-11-19 DIAGNOSIS — Z79899 Other long term (current) drug therapy: Secondary | ICD-10-CM

## 2023-11-19 NOTE — Telephone Encounter (Signed)
 Triager clarified Rx request from pharmacy: Esbriet  801 mg is the Rx being requested that was ordered by Dr. Theophilus.  Pharmacy called patient to get Rx renewal for a discontinued Rx below. Please advise.  ESBRIET  801 MG TABS [[id number]]  DISCONTINUED

## 2023-11-19 NOTE — Telephone Encounter (Signed)
 Copied from CRM [id number]. Topic: Clinical - Medication Refill >> Nov 19, 2023  8:34 AM Joesph PARAS wrote: Medication: aspirin 801 per patient - med not on list  Has the patient contacted their pharmacy? Yes Pharmacy states that they faxed over request to Dr. Theophilus.   This is the patient's preferred pharmacy:   States it need to go to the people who contacted her. States [facility] true [facility] Tablet from [place]. States number is [phone] according to patient.   Is this the correct pharmacy for this prescription? Yes If no, delete pharmacy and type the correct one.   Has the prescription been filled recently? No  Is the patient out of the medication? Yes - States enough to last to Monday  Has the patient been seen for an appointment in the last year OR does the patient have an upcoming appointment? Yes  Can we respond through MyChart? No  Agent: Please be advised that Rx refills may take up to 3 business days. We ask that you follow-up with your pharmacy.

## 2023-11-22 MED ORDER — PIRFENIDONE 801 MG PO TABS: 1.0000 | ORAL_TABLET | Freq: Three times a day (TID) | ORAL | 0 refills | Status: DC

## 2023-11-22 NOTE — Telephone Encounter (Signed)
 Refill sent for PIRFENIDONE  to [facility] for Esbriet : [phone]  Dose: 801mg  three times daily   Last OV: 07/07/23 Provider: Dr. Theophilus Pertinent labs: LFTs wnl 07/07/23, now overdue   Next OV: 12/01/23 with Dr. Theophilus  Called patient to discuss repeat LFTs are overdue, recommend to obtain day of next OV with Dr. Theophilus on 12/01/23. Ordered hepatic function panel. Left VM with contact number to return call. Will place 30 day supply today to avoid gap in care. Need LFTs to place more refills after today.   Miranda [name], PharmD, BCPS Clinical Pharmacist  [facility]

## 2023-11-22 NOTE — Telephone Encounter (Signed)
 Sending message to pharmacy to clarify and resend prescription for pirfenidone .

## 2023-11-30 ENCOUNTER — Encounter: Payer: Self-pay | Admitting: Gastroenterology

## 2023-12-01 ENCOUNTER — Encounter: Payer: Self-pay | Admitting: Pulmonary Disease

## 2023-12-01 ENCOUNTER — Ambulatory Visit: Admitting: Pulmonary Disease

## 2023-12-01 VITALS — BP 130/94 | HR 70 | Temp 97.5°F | Ht 65.0 in | Wt 165.0 lb

## 2023-12-01 DIAGNOSIS — Z5181 Encounter for therapeutic drug level monitoring: Secondary | ICD-10-CM

## 2023-12-01 DIAGNOSIS — I4891 Unspecified atrial fibrillation: Secondary | ICD-10-CM

## 2023-12-01 DIAGNOSIS — G4733 Obstructive sleep apnea (adult) (pediatric): Secondary | ICD-10-CM | POA: Diagnosis not present

## 2023-12-01 DIAGNOSIS — J84112 Idiopathic pulmonary fibrosis: Secondary | ICD-10-CM | POA: Diagnosis not present

## 2023-12-01 DIAGNOSIS — Z79899 Other long term (current) drug therapy: Secondary | ICD-10-CM | POA: Diagnosis not present

## 2023-12-01 LAB — COMPREHENSIVE METABOLIC PANEL WITH GFR
ALT: 31 U/L (ref 0–35)
AST: 33 U/L (ref 0–37)
Albumin: 4.3 g/dL (ref 3.5–5.2)
Alkaline Phosphatase: 43 U/L (ref 39–117)
BUN: 9 mg/dL (ref 6–23)
CO2: 28 meq/L (ref 19–32)
Calcium: 10.3 mg/dL (ref 8.4–10.5)
Chloride: 103 meq/L (ref 96–112)
Creatinine, Ser: 0.88 mg/dL (ref 0.40–1.20)
GFR: 63.5 mL/min (ref >60.00)
Glucose, Bld: 79 mg/dL (ref 70–99)
Potassium: 4.6 meq/L (ref 3.5–5.1)
Sodium: 142 meq/L (ref 135–145)
Total Bilirubin: 0.4 mg/dL (ref 0.2–1.2)
Total Protein: 7.7 g/dL (ref 6.0–8.3)

## 2023-12-01 LAB — BRAIN NATRIURETIC PEPTIDE: Pro B Natriuretic peptide (BNP): 433 pg/mL — ABNORMAL HIGH (ref 0.0–100.0)

## 2023-12-01 LAB — HEPATIC FUNCTION PANEL
AG Ratio: 1.4 (calc) (ref 1.0–2.5)
ALT: 31 U/L — ABNORMAL HIGH (ref 6–29)
AST: 32 U/L (ref 10–35)
Albumin: 4.4 g/dL (ref 3.6–5.1)
Alkaline phosphatase (APISO): 49 U/L (ref 37–153)
Bilirubin, Direct: 0.1 mg/dL (ref 0.0–0.2)
Globulin: 3.1 g/dL (ref 1.9–3.7)
Indirect Bilirubin: 0.4 mg/dL (ref 0.2–1.2)
Total Bilirubin: 0.5 mg/dL (ref 0.2–1.2)
Total Protein: 7.5 g/dL (ref 6.1–8.1)

## 2023-12-01 NOTE — Patient Instructions (Signed)
  VISIT SUMMARY: Today, we reviewed your pulmonary fibrosis and obstructive sleep apnea. We discussed your current medications and the need for a follow-up sleep study.  YOUR PLAN: PULMONARY FIBROSIS: You have chronic pulmonary fibrosis, which is being managed with your current medication. Your recent tests show stable lung function. -Confirm with your pharmacist that your medication has been delivered. -We will ensure your prescription is sent for your medication. -We will draw blood for routine monitoring.  OBSTRUCTIVE SLEEP APNEA: You have mild obstructive sleep apnea and have been using a CPAP machine inconsistently. Your recent weight loss may have improved your condition. -We will order a home sleep study to reassess the severity of your sleep apnea.  ATRIAL FIBRILLATION: You have chronic atrial fibrillation, which is being managed with anticoagulation therapy. -Continue your current anticoagulation therapy as prescribed.

## 2023-12-01 NOTE — Progress Notes (Unsigned)
 Miranda [name]    [id number]    1945-07-03  Primary Care Physician:Hernandez Delma Tully GRADE, MD  Referring Physician: Theophilus Delma, Tully GRADE, MD [address] [place],  [place] [address]  Chief complaint: Follow-up for interstitial lung disease, OSA  HPI: Mrs. Miranda [name] is here for follow-up of obstructive sleep apnea, pulmonary fibrosis. PE in 2018 opn anticoagulation, proxysmal atrial fib She has history of UIP fibrosis.  There is history of rheumatoid arthritis in chart but connective tissue serologies are negative with no arthritis, joint pain symptoms.  Pulmonary fibrosis is felt to be more consistent with IPF and she has been on Esbriet  since 2019  She was hospitalized in early February 2023 for dehydration, AKI and rapid atrial fibrillation.  Overall improved since discharge  Pets: Has a dog.  No cats, birds, farm animals Occupation: Retired Lawyer Exposures: Has feather pillows at home but not in her bedroom.  No mold, hot tub, Jacuzzi Smoking history: Never smoker Travel history: No significant travel history Relevant family history: No significant family history of lung disease  Interim history: Discussed the use of AI scribe software for clinical note transcription with the patient, who gave verbal consent to proceed.  History of Present Illness Interval history: SOBIA Miranda [name] is a 78 year old female with pulmonary fibrosis who presents for medication management and follow-up.  Pulmonary fibrosis - Pulmonary fibrosis managed with long-term medication therapy. - Experienced a previous period without medication, resulting in significant distress. - Currently awaiting shipment of medication and will confirm prescription renewal with pharmacist. GLENWOOD Comer financial support from a foundation for medication access.  Obstructive sleep apnea - Diagnosed with mild obstructive sleep apnea in 2018. - Uses CPAP machine inconsistently. - Improved comfort  with different CPAP masks. - Possible weight loss since last evaluation; consideration for reassessment of sleep apnea severity.    Outpatient Encounter Medications as of 12/01/2023  Medication Sig   acetaminophen  (TYLENOL ) 325 MG tablet Take 650 mg by mouth every 6 (six) hours as needed for moderate pain.   albuterol  (VENTOLIN  HFA) 108 (90 Base) MCG/ACT inhaler Inhale 1 puff into the lungs every 6 (six) hours as needed.   ALPRAZolam  (XANAX ) 0.25 MG tablet Take 1 tablet (0.25 mg total) by mouth 3 (three) times daily as needed.   amLODipine  (NORVASC ) 5 MG tablet Take 1 tablet (5 mg total) by mouth daily.   atorvastatin  (LIPITOR ) 80 MG tablet Take 1 tablet by mouth in the evening   bisacodyl  (BISACODYL  LAXATIVE) 10 MG suppository Place 1 suppository (10 mg total) rectally daily as needed for moderate constipation.   Continuous Glucose Sensor (FREESTYLE LIBRE 2 SENSOR) MISC USE AS DIRECTED   cyclobenzaprine  (FLEXERIL ) 10 MG tablet Take 1 tablet (10 mg total) by mouth 3 (three) times daily as needed for muscle spasms.   docusate sodium  (COLACE) 100 MG capsule Take 1 capsule (100 mg total) by mouth 2 (two) times daily.   dronedarone  (MULTAQ ) 400 MG tablet Take 1 tablet (400 mg total) by mouth 2 (two) times daily with a meal.   ezetimibe  (ZETIA ) 10 MG tablet Take 1 tablet (10 mg total) by mouth daily.   hydrochlorothiazide  (HYDRODIURIL ) 25 MG tablet Take 1 tablet by mouth once daily   hydrochlorothiazide  (HYDRODIURIL ) 25 MG tablet Take 1 tablet (25 mg total) by mouth daily.   HYDROcodone -acetaminophen  (NORCO) 10-325 MG tablet Take 1 tablet by mouth every 6 (six) hours as needed.   hydrocortisone  (ANUSOL -HC) 2.5 %  rectal cream Place 1 Application rectally 2 (two) times daily.   lactulose  (CHRONULAC ) 10 GM/15ML solution Take 30 g by mouth 2 (two) times daily as needed.   lactulose , encephalopathy, (ENULOSE ) 10 GM/15ML SOLN take BY MOUTH three times daily AS NEEDED FOR mild constipation   losartan   (COZAAR ) 100 MG tablet Take 1 tablet (100 mg total) by mouth every evening.   metFORMIN  (GLUCOPHAGE -XR) 500 MG 24 hr tablet Take 1 tablet (500 mg total) by mouth daily with breakfast.   nitroGLYCERIN (NITRODUR - DOSED IN MG/24 HR) 0.2 mg/hr patch Place 0.2 mg onto the skin daily.   ondansetron  (ZOFRAN ) 4 MG tablet Take 1 tablet (4 mg total) by mouth every 8 (eight) hours as needed for nausea or vomiting.   Pirfenidone  801 MG TABS Take 1 tablet (801 mg total) by mouth 3 (three) times daily. **NEED REPEAT LABS FOR REFILLS**   Prucalopride Succinate  (MOTEGRITY ) 2 MG TABS Take 1 tablet (2 mg total) by mouth daily at 12 noon.   XARELTO  20 MG TABS tablet TAKE 1 TABLET BY MOUTH ONCE DAILY IN THE EVENING   benzonatate  (TESSALON  PERLES) 100 MG capsule 1-2 capsules up to twice daily as needed for cough. (Patient not taking: Reported on 12/01/2023)   No facility-administered encounter medications on file as of 12/01/2023.   Vitals:   12/01/23 1151  BP: (!) 130/94  Pulse: 70  Temp: (!) 97.5 F (36.4 C)  Height: 5' 5 (1.651 m)  Weight: 165 lb (74.8 kg)  SpO2: 100%  TempSrc: Oral  BMI (Calculated): 27.46     Physical Exam GEN: No acute distress. CV: Irregular heart rate due to atrial fibrillation. LUNGS: Crackles present due to lung scarring. SKIN JOINTS: Warm and dry, no rash.    Data Reviewed: Imaging: High-res CT 12/09/2018- mild pulmonary fibrosis and basilar predominance.  Traction bronchiectasis, honeycombing.  Unchanged compared to 2017  High-res CT 12/15/2019-unchanged UIP pulmonary fibrosis  High-res CT 10/24/2021-stable pattern of UIP pulmonary fibrosis  High-res CT/14/2025-pulmonary fibrosis and UIP pattern.  Appears stable/minimally progressive by my review.  Final radiology read is pending I have reviewed all images personally  PFTs: 06/14/2017 FVC 2.21 [89%], FEV1 1.88 [98%],/55, TLC 3.68 [70%], DLCO 14.69 [57%]  10/15/2019 FVC 2.10 [83%), FEV1 1.42 [73%], F/F 68, TLC 3.77  70%], DLCO 16.51 [80%]  11/12/2021 FVC 2.25 [73%], FEV1 1.91 [82%], F/F 85, TLC 3.79 [70%], DLCO 16.71 [81%] Mild restriction  07/07/2023 FVC 2.22 [76%], FEV1 1.77 [81%], F/F80, TLC 2.84 [73%], DLCO 14.93 [75%] Mild restriction, mild diffusion defect  Labs: CTD serologies including CCP, rheumatoid factor 2017, 2018, 2019-negative Comprehensive metabolic panel 11/09/2018- normal.  Sleep study: 10/2016 PSG> AHI 10.5   CPAP download 12/19/2018-77% compliance.  Residual AHI 2  Cardiac: Echocardiogram  10/19/2017-LVEF 60 to 65%, grade 1 diastolic dysfunction, mild RV dilatation. 04/14/2021- LVEF 55 - 60 %, RV systolic function is normal, RV systolic size is normal, normal PA systolic pressure Assessment & Plan Idiopathic Pulmonary Fibrosis (IPF) Although she has history of rheumatoid arthritis in the chart she does not have any symptoms.  And has negative serologies.  Presentation is consistent with IPF which has been stable on CT and PFTs.  No pulmonary hypertension on echocardiogram  -Continue pirfenidone .  Confirm medication delivery with pharmacist - Ensure prescription is sent for medication - Draw blood for routine monitoring.  Check hepatic panel, BNP  Obstructive sleep apnea Mild obstructive sleep apnea diagnosed in 2018, with intermittent CPAP use and some discomfort. Recent weight loss  may have improved the condition. Alternative treatments such as dental devices or Inspire device discussed. - Order home sleep study to reassess sleep apnea severity  Atrial fibrillation Chronic atrial fibrillation with irregular heart rate, currently managed with anticoagulation therapy.   Plan/Recommendations: - Continue Esbriet  - Check labs for monitoring - Home sleep study  Lonna Coder MD [facility] 12/01/2023, 11:58 AM  CC: Theophilus Andrews, [name]

## 2023-12-02 ENCOUNTER — Ambulatory Visit: Payer: Self-pay

## 2023-12-02 ENCOUNTER — Telehealth: Payer: Self-pay

## 2023-12-02 DIAGNOSIS — R0600 Dyspnea, unspecified: Secondary | ICD-10-CM

## 2023-12-02 DIAGNOSIS — J84112 Idiopathic pulmonary fibrosis: Secondary | ICD-10-CM

## 2023-12-02 MED ORDER — PIRFENIDONE 801 MG PO TABS: 1.0000 | ORAL_TABLET | Freq: Three times a day (TID) | ORAL | 1 refills | Status: DC

## 2023-12-02 NOTE — Addendum Note (Signed)
 Addended by: Jonee Lamore L on: 12/02/2023 09:03 AM   Modules accepted: Orders

## 2023-12-02 NOTE — Telephone Encounter (Signed)
 LFTs stable 12/01/23. Rx renewed with refills for pirfenidone .  Aleck Puls, PharmD, BCPS Clinical Pharmacist  [facility]

## 2023-12-02 NOTE — Telephone Encounter (Signed)
 Patient brought in letter from [facility] [employer] [facility] to OV with Dr. Theophilus yesterday. Letter states that [employer]. Inquired rights to Esbriet  and will take over supply, distribution, and sales of Esbriet  as of January 17, 2024. Unclear what action is needed on behalf of patient.   [employer] to inquire at [phone]. Unable to reach representative, no VM option. Per letter, [employer] will be available for inquires starting December 15, 2023.   Attempted to reach [employer] for clarification - what actions needed from pharmacy team or patient. Unable to reach at [phone]. Left VM with contact number to return call.   Letter sent to media tab for retention. Will await return call from [facility]. ATC [employer]. On Dec 15, 2023.   Aleck Puls, PharmD, BCPS Clinical Pharmacist  [facility]

## 2023-12-03 ENCOUNTER — Other Ambulatory Visit: Payer: Self-pay | Admitting: Internal Medicine

## 2023-12-03 ENCOUNTER — Encounter: Payer: Self-pay | Admitting: Pulmonary Disease

## 2023-12-03 NOTE — Telephone Encounter (Signed)
 ATC patient and son Sydnee) to alert of above. Left VM with contact number to return call if questions.   Aleck Puls, PharmD, BCPS Clinical Pharmacist  [facility]

## 2023-12-06 NOTE — Telephone Encounter (Signed)
 [employer] representative returned my call.   Prescription will be automatically transferred to [facility]. Patient will be automatically enrolled in [employer] through end of 2025. No impact to eligibility through end of 2025.   [facility] will reach out to patient or staff if information is needed.   Aleck Puls, PharmD, BCPS Clinical Pharmacist  [facility]

## 2023-12-13 ENCOUNTER — Telehealth: Payer: Self-pay

## 2023-12-13 ENCOUNTER — Encounter: Payer: Self-pay | Admitting: Gastroenterology

## 2023-12-13 NOTE — Telephone Encounter (Signed)
 This patient is scheduled for a PV on 10/1. She is on Xarelto  and has not been seen within a year. I am not sure if Dr. Legrand would be ok with Clearance being received or if she would need to come in for an Ov. Her procedure is scheduled for 10/15. Can anyone advise if I should cancel her PV and have her scheduled for an OV.   Thank you, PV

## 2023-12-15 ENCOUNTER — Encounter

## 2023-12-15 NOTE — Telephone Encounter (Signed)
 Dr. Legrand please see attached and advised. Pts PV is today I will call and make her an OV if needed.  Thank you

## 2023-12-15 NOTE — Telephone Encounter (Signed)
 Just as an RICK looks like the patient was seen on 7/25 for a preop cardiovascular exam for another procedure. She was advised to hold xarelto  3 days at the time. I am not sure if you would be ok with using that clearance or if we should obtain a new one   Thank you

## 2023-12-15 NOTE — Telephone Encounter (Signed)
 Yes, we can use the recommendations from her  July cardiology clearance for her colonoscopy, though I recommend only a 2-day old Xarelto .  VEAR Brand MD

## 2023-12-16 ENCOUNTER — Encounter: Payer: Self-pay | Admitting: Gastroenterology

## 2023-12-16 ENCOUNTER — Ambulatory Visit (AMBULATORY_SURGERY_CENTER)

## 2023-12-16 VITALS — Ht 65.0 in | Wt 165.0 lb

## 2023-12-16 DIAGNOSIS — Z800 Family history of malignant neoplasm of digestive organs: Secondary | ICD-10-CM

## 2023-12-16 DIAGNOSIS — Z860100 Personal history of colon polyps, unspecified: Secondary | ICD-10-CM

## 2023-12-16 MED ORDER — PEG 3350-KCL-NA BICARB-NACL 420 G PO SOLR: 4000.0000 mL | Freq: Once | ORAL | 0 refills | Status: AC

## 2023-12-16 NOTE — Progress Notes (Signed)
 Pre visit completed via phone call; Patient verified name, DOB, and address; No egg or soy allergy known to patient  No issues known to pt with past sedation with any surgeries or procedures Patient denies ever being told they had issues or difficulty with intubation  No FH of Malignant Hyperthermia Pt is not on diet pills Pt is not on home 02  Pt is not on blood thinners  Pt denies issues with constipation  AFIB Have any cardiac testing pending--NO Insurance verified during PV appt--- HTA Medicare Pt can ambulate without assistance;  Pt denies use of chewing tobacco Discussed diabetic/weight loss medication holds; Discussed NSAID holds; Checked BMI to be less than 50; Pt instructed to use Single[employer] or GoodRx for a price reduction on prep   Pre visit completed and red dot placed by patient's name on their procedure day (on provider's schedule).   Instructions printed and given to the receptionist for patient to pick up per her request;

## 2023-12-28 ENCOUNTER — Telehealth: Payer: Self-pay

## 2023-12-28 ENCOUNTER — Other Ambulatory Visit: Payer: Self-pay

## 2023-12-28 DIAGNOSIS — E785 Hyperlipidemia, unspecified: Secondary | ICD-10-CM

## 2023-12-28 DIAGNOSIS — I10 Essential (primary) hypertension: Secondary | ICD-10-CM

## 2023-12-28 MED ORDER — ATORVASTATIN CALCIUM 80 MG PO TABS: 80.0000 mg | ORAL_TABLET | Freq: Every evening | ORAL | 0 refills | Status: DC

## 2023-12-28 MED ORDER — LOSARTAN POTASSIUM 100 MG PO TABS: 100.0000 mg | ORAL_TABLET | Freq: Every evening | ORAL | 0 refills | Status: AC

## 2023-12-28 MED ORDER — HYDROCHLOROTHIAZIDE 25 MG PO TABS: 25.0000 mg | ORAL_TABLET | Freq: Every day | ORAL | 0 refills | Status: DC

## 2023-12-28 NOTE — Progress Notes (Signed)
   12/28/2023  Patient ID: Miranda [name], female   DOB: [birth year], 78 y.o.   MRN: [id number]  Pharmacy Quality Measure Review  This patient is appearing on a report for being at risk of failing the adherence measure for hypertension (ACEi/ARB) medications this calendar year.   Medication: Atorvastatin  80mg  Last fill date: 08/09/23 for 90 day supply  Spoke with patient. She requests refill on lipitor , hydrochlorothiazide , zetia , multaq , amlodipine  and losartan . Contacted pharmacy to coordinate. They requested new rx for hydrochlorothiazide , lipitor  and losartan . Sent PCP message to coordinate refills.  Jon VEAR Lindau, PharmD Clinical Pharmacist [phone]

## 2023-12-29 ENCOUNTER — Encounter: Payer: Self-pay | Admitting: Gastroenterology

## 2023-12-29 ENCOUNTER — Ambulatory Visit: Admitting: Gastroenterology

## 2023-12-29 VITALS — BP 134/72 | HR 58 | Temp 96.8°F | Resp 17 | Ht 65.0 in | Wt 165.0 lb

## 2023-12-29 DIAGNOSIS — Z860100 Personal history of colon polyps, unspecified: Secondary | ICD-10-CM

## 2023-12-29 DIAGNOSIS — E119 Type 2 diabetes mellitus without complications: Secondary | ICD-10-CM | POA: Diagnosis not present

## 2023-12-29 DIAGNOSIS — I509 Heart failure, unspecified: Secondary | ICD-10-CM | POA: Diagnosis not present

## 2023-12-29 DIAGNOSIS — K648 Other hemorrhoids: Secondary | ICD-10-CM

## 2023-12-29 DIAGNOSIS — Z860101 Personal history of adenomatous and serrated colon polyps: Secondary | ICD-10-CM

## 2023-12-29 DIAGNOSIS — K5730 Diverticulosis of large intestine without perforation or abscess without bleeding: Secondary | ICD-10-CM

## 2023-12-29 DIAGNOSIS — I4891 Unspecified atrial fibrillation: Secondary | ICD-10-CM | POA: Diagnosis not present

## 2023-12-29 DIAGNOSIS — G4730 Sleep apnea, unspecified: Secondary | ICD-10-CM | POA: Diagnosis not present

## 2023-12-29 DIAGNOSIS — D122 Benign neoplasm of ascending colon: Secondary | ICD-10-CM | POA: Diagnosis not present

## 2023-12-29 DIAGNOSIS — K6289 Other specified diseases of anus and rectum: Secondary | ICD-10-CM

## 2023-12-29 DIAGNOSIS — F419 Anxiety disorder, unspecified: Secondary | ICD-10-CM | POA: Diagnosis not present

## 2023-12-29 DIAGNOSIS — Z1211 Encounter for screening for malignant neoplasm of colon: Secondary | ICD-10-CM

## 2023-12-29 DIAGNOSIS — J45909 Unspecified asthma, uncomplicated: Secondary | ICD-10-CM | POA: Diagnosis not present

## 2023-12-29 DIAGNOSIS — Z800 Family history of malignant neoplasm of digestive organs: Secondary | ICD-10-CM

## 2023-12-29 MED ORDER — SODIUM CHLORIDE 0.9 % IV SOLN: 500.0000 mL | Freq: Once | INTRAVENOUS | Status: DC

## 2023-12-29 NOTE — Progress Notes (Signed)
 Vss nad trans to pacu

## 2023-12-29 NOTE — Progress Notes (Signed)
 Called to room to assist during endoscopic procedure.  Patient ID and intended procedure confirmed with present staff. Received instructions for my participation in the procedure from the performing physician.

## 2023-12-29 NOTE — Op Note (Signed)
 [facility] Patient Name: Miranda [name] Procedure Date: 12/29/2023 11:23 AM MRN: [id number] Endoscopist: Victory L. Legrand , MD, [id number] Age: 78 Referring MD:  Date of Birth: 07/05/1945 Gender: Female Account #: [id number] Procedure:                Colonoscopy Indications:              Colon cancer screening in patient at increased                            risk: Colorectal cancer in brothers                           Personal history of colonic polyps -multiple                            adenomatous polyps in 2012; 5 adenomatous polyps                            (up to 10mm) September 2022 Medicines:                Monitored Anesthesia Care Procedure:                Pre-Anesthesia Assessment:                           - Prior to the procedure, a History and Physical                            was performed, and patient medications and                            allergies were reviewed. The patient's tolerance of                            previous anesthesia was also reviewed. The risks                            and benefits of the procedure and the sedation                            options and risks were discussed with the patient.                            All questions were answered, and informed consent                            was obtained. Prior Anticoagulants: The patient has                            taken Xarelto  (rivaroxaban ), last dose was 2 days                            prior to procedure. ASA Grade Assessment: III - A  patient with severe systemic disease. After                            reviewing the risks and benefits, the patient was                            deemed in satisfactory condition to undergo the                            procedure.                           After obtaining informed consent, the colonoscope                            was passed under direct vision. Throughout the                             procedure, the patient's blood pressure, pulse, and                            oxygen saturations were monitored continuously. The                            CF [id number] #[id number] was introduced through the anus                            and advanced to the the cecum, identified by                            appendiceal orifice and ileocecal valve. The                            colonoscopy was performed with difficulty due to a                            redundant colon and the patient's body habitus                            (loose and redundant abdominal wall). Successful                            completion of the procedure was aided by changing                            the patient's position, using manual pressure and                            straightening and shortening the scope to obtain                            bowel loop reduction. The patient tolerated the  procedure well. The quality of the bowel                            preparation was good with additional lavage                            required. The ileocecal valve, appendiceal orifice,                            and rectum were photographed. The bowel preparation                            used was GoLYTELY . Scope In: 11:42:01 AM Scope Out: 12:08:49 PM Scope Withdrawal Time: 0 hours 13 minutes 12 seconds  Total Procedure Duration: 0 hours 26 minutes 48 seconds  Findings:                 The digital rectal exam findings include decreased                            sphincter tone.                           Repeat examination of right colon under NBI                            performed.                           Multiple diverticula were found in the left colon                            and right colon.                           The colon (entire examined portion) was                            significantly redundant.                           Three sessile polyps were found in the  ascending                            colon. The polyps were 2 to 6 mm in size. These                            polyps were removed with a cold snare. Resection                            complete and retrieval partial.                           Internal hemorrhoids were found.                           The  exam was otherwise without abnormality on                            direct and retroflexion views. Complications:            No immediate complications. Estimated Blood Loss:     Estimated blood loss was minimal. Impression:               - Decreased sphincter tone found on digital rectal                            exam.                           - Diverticulosis in the left colon and in the right                            colon.                           - Redundant colon.                           - Three 2 to 6 mm polyps in the ascending colon,                            removed with a cold snare. All resected with 2                            retrieved Recommendation:           - Patient has a contact number available for                            emergencies. The signs and symptoms of potential                            delayed complications were discussed with the                            patient. Return to normal activities tomorrow.                            Written discharge instructions were provided to the                            patient.                           - Resume previous diet.                           - Continue present medications.                           - Resume Xarelto  (rivaroxaban ) at prior dose  tomorrow.                           - Await pathology results.                           - No repeat screening/surveillance colonoscopy                            recommended due to age and current guidelines. Shiron Whetsel L. Legrand, MD 12/29/2023 12:16:37 PM This report has been signed electronically.

## 2023-12-29 NOTE — Progress Notes (Signed)
 History and Physical:  This patient presents for endoscopic testing for: Encounter Diagnoses  Name Primary?   Hx of colonic polyps Yes   Family history of colorectal cancer     78 year old woman here today for surveillance colonoscopy for the history of adenomatous polyps. Multiple tubular adenomas in 2012 5 tubular adenomas up to 10 mm in size in September 2022 In addition, 2 brothers had colorectal cancer Patient denies chronic abdominal pain, rectal bleeding, constipation or diarrhea.   Patient is otherwise without complaints or active issues today.   Past Medical History: Past Medical History:  Diagnosis Date   Anemia    Anxiety    Arthritis    Asthma    Atrial fibrillation (HCC)    CHF (congestive heart failure) (HCC)    pt. unsure- but thinks she was hosp. for CHF- 2002   [facility]   Clotting disorder    blood clots in lungs/PE pulmonary embolism   Colon polyps    Complication of anesthesia    states requires a lot med. to put her to sleep    DDD (degenerative disc disease) 09/17/2011   Depression    sometimes    DM (diabetes mellitus) (HCC)    Family history of anesthesia complication    Sister had difficulty waking up from anesthesia   GERD (gastroesophageal reflux disease)    Glaucoma    bilateral, pt. admits that she is noncompliant to eye gtts.    Hemorrhoids    Hyperlipidemia    Hypertension    had stress, echo- 2006 /w [place], Cardiac Cath, per pt. 2002, echo repeated 2012- wnl    IPF (idiopathic pulmonary fibrosis) (HCC)    Low back pain    Shortness of breath    Sleep apnea    uses c-pap- q night recently     Past Surgical History: Past Surgical History:  Procedure Laterality Date   ABDOMINAL HYSTERECTOMY     ectopic, fibroids   ANTERIOR CERVICAL DECOMP/DISCECTOMY FUSION N/A 09/11/2020   Procedure: Anterior Cervical Decompression Fusion Cervical four-five, Cervical five-six;  Surgeon: Debby Dorn MATSU, MD;  Location: [facility] OR;  Service: Neurosurgery;  Laterality: N/A;   arm surgery Right    CARDIAC CATHETERIZATION     CATARACT EXTRACTION, BILATERAL     cataracts removed bilateral- ?IOL   COLONOSCOPY  11/2020   HD-MAC-prep good-TA   FISSURECTOMY  10/08/2011   Procedure: FISSURECTOMY;  Surgeon: Jina Nephew, MD;  Location: [facility] OR;  Service: General;  Laterality: N/A;   FLEXIBLE SIGMOIDOSCOPY  02/25/2011   Procedure: ENID MORIN;  Surgeon: [name] JONETTA Aho, MD;  Location: [facility] END[facility]COPY;  Service: Endoscopy;  Laterality: N/A;   FOOT SURGERY     bilat, heel spurs- screw in R foot    HEMORRHOID SURGERY  10/08/2011   Procedure: HEMORRHOIDECTOMY;  Surgeon: Jina Nephew, MD;  Location: [facility] OR;  Service: General;  Laterality: N/A;  External    REVERSE SHOULDER ARTHROPLASTY Left 08/13/2022   Procedure: REVERSE SHOULDER ARTHROPLASTY;  Surgeon: Melita Drivers, MD;  Location: [facility] ORS;  Service: Orthopedics;  Laterality: Left;    ROTATOR CUFF REPAIR Right    SPHINCTEROTOMY  10/08/2011   Procedure: SPHINCTEROTOMY;  Surgeon: Jina Nephew, MD;  Location: [facility] OR;  Service: General;  Laterality: N/A;    Allergies: Allergies  Allergen Reactions   Cashew Nut Oil Shortness Of Breath   Fish Oil Anaphylaxis   Other Hives, Shortness Of Breath and Swelling  Allergic to cashew nuts and peanut oil.   Peanut Oil Shortness Of Breath   Penicillins Anaphylaxis, Hives and Swelling    Has patient had a PCN reaction causing immediate rash, facial/tongue/throat swelling, SOB or lightheadedness with hypotension: Face swelling and hives started first, then swelling of the throat  Has patient had a PCN reaction causing severe rash involving mucus membranes or skin necrosis: Yes  Has patient had a PCN reaction that required hospitalization: No  Has patient had a PCN reaction occurring within the last 10 years: Yes  If all of the above answers are NO, then may proceed with Cephalospor    Pneumococcal Vaccines Nausea And Vomiting   Aspirin Itching and Rash   Bactrim  [Sulfamethoxazole -Trimethoprim ] Hives, Itching and Rash   Ciprofloxacin  Hives, Itching and Rash   Ibuprofen Rash   Influenza Vaccines Hives   Ivp Dye [Iodinated Contrast Media] Hives, Itching and Rash    Gives benadryl  to counteract symptoms   Latex Rash   Macrobid  [Nitrofurantoin  Monohydrate Macrocrystals] Hives   Nitrofurantoin  Macrocrystal Hives   Shellfish Allergy Hives    Patient also allergic to seafood    Outpatient Meds: Current Outpatient Medications  Medication Sig Dispense Refill   amLODipine  (NORVASC ) 5 MG tablet Take 1 tablet (5 mg total) by mouth daily. 90 tablet 1   atorvastatin  (LIPITOR ) 80 MG tablet Take 1 tablet (80 mg total) by mouth every evening. 90 tablet 0   bisacodyl  (BISACODYL  LAXATIVE) 10 MG suppository Place 1 suppository (10 mg total) rectally daily as needed for moderate constipation. 12 suppository 0   dronedarone  (MULTAQ ) 400 MG tablet Take 1 tablet (400 mg total) by mouth 2 (two) times daily with a meal. 60 tablet 3   ezetimibe  (ZETIA ) 10 MG tablet Take 1 tablet (10 mg total) by mouth daily. 90 tablet 1   hydrochlorothiazide  (HYDRODIURIL ) 25 MG tablet Take 1 tablet (25 mg total) by mouth daily. 90 tablet 0   hydrocortisone  (ANUSOL -HC) 2.5 % rectal cream Place 1 Application rectally 2 (two) times daily. 30 g 2   losartan  (COZAAR ) 100 MG tablet Take 1 tablet (100 mg total) by mouth every evening. 90 tablet 0   metFORMIN  (GLUCOPHAGE -XR) 500 MG 24 hr tablet Take 1 tablet (500 mg total) by mouth daily with breakfast. 90 tablet 1   Pirfenidone  801 MG TABS Take 1 tablet (801 mg total) by mouth 3 (three) times daily. 270 tablet 1   acetaminophen  (TYLENOL ) 325 MG tablet Take 650 mg by mouth every 6 (six) hours as needed for moderate pain.     albuterol  (VENTOLIN  HFA) 108 (90 Base) MCG/ACT inhaler Inhale 1 puff into the lungs every 6 (six) hours as needed. 18 g 1   ALPRAZolam  (XANAX )  0.25 MG tablet Take 1 tablet (0.25 mg total) by mouth 3 (three) times daily as needed. 90 tablet 1   Continuous Glucose Sensor (FREESTYLE LIBRE 2 SENSOR) MISC USE AS DIRECTED 2 each 5   cyclobenzaprine  (FLEXERIL ) 10 MG tablet Take 1 tablet (10 mg total) by mouth 3 (three) times daily as needed for muscle spasms. 30 tablet 1   docusate sodium  (COLACE) 100 MG capsule Take 1 capsule (100 mg total) by mouth 2 (two) times daily. 60 capsule 2   HYDROcodone -acetaminophen  (NORCO) 10-325 MG tablet Take 1 tablet by mouth every 6 (six) hours as needed. 30 tablet 0   lactulose  (CHRONULAC ) 10 GM/15ML solution Take 30 g by mouth 2 (two) times daily as needed.     nitroGLYCERIN (NITRODUR -  DOSED IN MG/24 HR) 0.2 mg/hr patch Place 0.2 mg onto the skin daily.     Prucalopride Succinate  (MOTEGRITY ) 2 MG TABS Take 1 tablet (2 mg total) by mouth daily at 12 noon. 30 tablet 1   XARELTO  20 MG TABS tablet TAKE 1 TABLET BY MOUTH ONCE DAILY IN THE EVENING 90 tablet 0   Current Facility-Administered Medications  Medication Dose Route Frequency Provider Last Rate Last Admin   0.9 %  sodium chloride  infusion  500 mL Intravenous Once Danis, Victory LITTIE MOULD, MD          ___________________________________________________________________ Objective   Exam:  BP (!) 146/78   Pulse (!) 52   Temp (!) 96.8 F (36 C)   Ht 5' 5 (1.651 m)   Wt 165 lb (74.8 kg)   SpO2 99%   BMI 27.46 kg/m   CV: regular , S1/S2 Resp: clear to auscultation bilaterally, normal RR and effort noted GI: soft, no tenderness, with active bowel sounds.   Assessment: Encounter Diagnoses  Name Primary?   Hx of colonic polyps Yes   Family history of colorectal cancer      Plan: Colonoscopy   The benefits and risks of the planned procedure(s) were described in detail with the patient or (when appropriate) their health care proxy.  Risks were outlined as including, but not limited to, bleeding, infection, perforation, adverse medication  reaction leading to cardiac or pulmonary decompensation, pancreatitis (if ERCP).  The limitation of incomplete mucosal visualization was also discussed.  No guarantees or warranties were given.  The patient is appropriate for an endoscopic procedure in the ambulatory setting.   - Victory Brand, MD

## 2023-12-29 NOTE — Patient Instructions (Signed)
  Resume Xarelto  at prior dose tomorrow 12/30/2023  Handouts on polyps,diverticulosis,& hemorrhoids given to you today.   Await pathology results on polyps removed   Continue previous diet & medications   YOU HAD AN ENDOSCOPIC PROCEDURE TODAY AT THE [facility]:   Refer to the procedure report that was given to you for any specific questions about what was found during the examination.  If the procedure report does not answer your questions, please call your gastroenterologist to clarify.  If you requested that your care partner not be given the details of your procedure findings, then the procedure report has been included in a sealed envelope for you to review at your convenience later.  YOU SHOULD EXPECT: Some feelings of bloating in the abdomen. Passage of more gas than usual.  Walking can help get rid of the air that was put into your GI tract during the procedure and reduce the bloating. If you had a lower endoscopy (such as a colonoscopy or flexible sigmoidoscopy) you may notice spotting of blood in your stool or on the toilet paper. If you underwent a bowel prep for your procedure, you may not have a normal bowel movement for a few days.  Please Note:  You might notice some irritation and congestion in your nose or some drainage.  This is from the oxygen used during your procedure.  There is no need for concern and it should clear up in a day or so.  SYMPTOMS TO REPORT IMMEDIATELY:  Following lower endoscopy (colonoscopy or flexible sigmoidoscopy):  Excessive amounts of blood in the stool  Significant tenderness or worsening of abdominal pains  Swelling of the abdomen that is new, acute  Fever of 100F or higher   For urgent or emergent issues, a gastroenterologist can be reached at any hour by calling [phone]. Do not use MyChart messaging for urgent concerns.    DIET:  We do recommend a small meal at first, but then you may proceed to your regular diet.  Drink  plenty of fluids but you should avoid alcoholic beverages for 24 hours.  ACTIVITY:  You should plan to take it easy for the rest of today and you should NOT DRIVE or use heavy machinery until tomorrow (because of the sedation medicines used during the test).    FOLLOW UP: Our staff will call the number listed on your records the next business day following your procedure.  We will call around 7:15- 8:00 am to check on you and address any questions or concerns that you may have regarding the information given to you following your procedure. If we do not reach you, we will leave a message.     If any biopsies were taken you will be contacted by phone or by letter within the next 1-3 weeks.  Please call us  at [phone] if you have not heard about the biopsies in 3 weeks.    SIGNATURES/CONFIDENTIALITY: You and/or your care partner have signed paperwork which will be entered into your electronic medical record.  These signatures attest to the fact that that the information above on your After Visit Summary has been reviewed and is understood.  Full responsibility of the confidentiality of this discharge information lies with you and/or your care-partner.

## 2023-12-29 NOTE — Progress Notes (Signed)
 Pt's states no medical or surgical changes since previsit or office visit.

## 2023-12-30 ENCOUNTER — Telehealth: Payer: Self-pay | Admitting: *Deleted

## 2023-12-30 NOTE — Telephone Encounter (Signed)
 Attempted f/u phone call. No answer. Left message.

## 2023-12-31 ENCOUNTER — Other Ambulatory Visit: Payer: Self-pay | Admitting: Internal Medicine

## 2023-12-31 DIAGNOSIS — F419 Anxiety disorder, unspecified: Secondary | ICD-10-CM

## 2023-12-31 LAB — SURGICAL PATHOLOGY

## 2024-01-02 ENCOUNTER — Encounter

## 2024-01-02 DIAGNOSIS — Z5181 Encounter for therapeutic drug level monitoring: Secondary | ICD-10-CM

## 2024-01-02 DIAGNOSIS — J84112 Idiopathic pulmonary fibrosis: Secondary | ICD-10-CM

## 2024-01-02 DIAGNOSIS — G4730 Sleep apnea, unspecified: Secondary | ICD-10-CM | POA: Diagnosis not present

## 2024-01-03 ENCOUNTER — Ambulatory Visit: Payer: Self-pay | Admitting: Gastroenterology

## 2024-01-06 DIAGNOSIS — Z5181 Encounter for therapeutic drug level monitoring: Secondary | ICD-10-CM | POA: Diagnosis not present

## 2024-01-06 DIAGNOSIS — M51362 Other intervertebral disc degeneration, lumbar region with discogenic back pain and lower extremity pain: Secondary | ICD-10-CM | POA: Diagnosis not present

## 2024-01-06 DIAGNOSIS — M5412 Radiculopathy, cervical region: Secondary | ICD-10-CM | POA: Diagnosis not present

## 2024-01-06 DIAGNOSIS — M7660 Achilles tendinitis, unspecified leg: Secondary | ICD-10-CM | POA: Diagnosis not present

## 2024-01-06 DIAGNOSIS — M5416 Radiculopathy, lumbar region: Secondary | ICD-10-CM | POA: Diagnosis not present

## 2024-01-11 ENCOUNTER — Ambulatory Visit (HOSPITAL_COMMUNITY)
Admission: RE | Admit: 2024-01-11 | Discharge: 2024-01-11 | Disposition: A | Discharge status: Home / Self Care | Source: Ambulatory Visit | Attending: Cardiology | Admitting: Cardiology

## 2024-01-11 DIAGNOSIS — R0689 Other abnormalities of breathing: Secondary | ICD-10-CM | POA: Diagnosis not present

## 2024-01-11 DIAGNOSIS — G4733 Obstructive sleep apnea (adult) (pediatric): Secondary | ICD-10-CM | POA: Diagnosis not present

## 2024-01-11 DIAGNOSIS — J84112 Idiopathic pulmonary fibrosis: Secondary | ICD-10-CM | POA: Insufficient documentation

## 2024-01-11 DIAGNOSIS — R0600 Dyspnea, unspecified: Secondary | ICD-10-CM | POA: Diagnosis present

## 2024-01-11 LAB — ECHOCARDIOGRAM COMPLETE
AR max vel: 1.48 cm2
AV Area VTI: 1.44 cm2
AV Area mean vel: 1.41 cm2
AV Mean grad: 8 mmHg
AV Peak grad: 15.1 mmHg
Ao pk vel: 1.95 m/s
Area-P 1/2: 3.81 cm2
S' Lateral: 2.5 cm

## 2024-01-26 ENCOUNTER — Ambulatory Visit: Payer: Self-pay | Admitting: Pulmonary Disease

## 2024-02-08 NOTE — Progress Notes (Signed)
   02/08/2024  Patient ID: Miranda [name], female   DOB: 06-Dec-1945, 78 y.o.   MRN: [id number]  Pharmacy Quality Measure Review  This patient is appearing on a report for being at risk of failing the adherence measure for diabetes medications this calendar year.   Medication: Metformin  Last fill date: 01/19/24 for 90 day supply  Insurance report was not up to date. No action needed at this time.   [id number]

## 2024-02-14 ENCOUNTER — Other Ambulatory Visit: Payer: Self-pay | Admitting: Internal Medicine

## 2024-02-14 DIAGNOSIS — F419 Anxiety disorder, unspecified: Secondary | ICD-10-CM

## 2024-02-16 ENCOUNTER — Telehealth: Payer: Self-pay

## 2024-02-16 NOTE — Telephone Encounter (Signed)
 Copied from CRM #[id number]. Topic: Clinical - Medication Question >> Feb 14, 2024 11:44 AM Rozanna MATSU wrote: Reason for CRM: pt stated she received a letter from [employer] stating something about her Pirfenidone  801 MG TABS medication and they will not be mailing out any longer, but she is not sure if this is what they are saying. She would like a call back about this. She stated Dr. Theophilus advised her she would still get the meds but will it be from same company.   Routing to Rx team to advise, as this is a specialty medication.

## 2024-02-17 NOTE — Telephone Encounter (Signed)
 Patient will be receiving the prescription from a different pharmacy called [facility]. Their phone number is [phone]. Per discussion with [facility], all information was transferred over. No further action needed from patient or office unless [employer] reaches out to us .   ATC patient - LVM with [employer] number above.

## 2024-02-24 ENCOUNTER — Other Ambulatory Visit: Payer: Self-pay | Admitting: Internal Medicine

## 2024-02-24 DIAGNOSIS — K5900 Constipation, unspecified: Secondary | ICD-10-CM

## 2024-02-29 ENCOUNTER — Telehealth: Payer: Self-pay | Admitting: *Deleted

## 2024-02-29 NOTE — Progress Notes (Signed)
 Patient states that she had a hypoglycemic event where her glucose dropped to 50.  Her daughter gave her some orange juice and her glucose was 100.

## 2024-02-29 NOTE — Telephone Encounter (Signed)
 Spoke to the patient and explained that I am trying to get her Freestyle [place] through DME.  The patient should check with her insurance company to find out which glucometer is covered.  Patient to call back.

## 2024-02-29 NOTE — Telephone Encounter (Signed)
 Copied from CRM #[id number]. Topic: Clinical - Prescription Issue >> Feb 29, 2024  1:40 PM Berneda F wrote: Reason for CRM: I see the note in there from today, but patient is actually calling about the [facility] sensors and states the pharmacy told her that they no longer support these sensors and that PCP would need to order something else for her. She states the pharmacy called and explained them to someone and she just wanted to check on that.  Can we please call patient back and give her updated information on the new sensor?  Patient callback is [phone] (home)

## 2024-02-29 NOTE — Telephone Encounter (Signed)
 Copied from CRM #[id number]. Topic: Clinical - Medication Question >> Feb 14, 2024 11:44 AM Rozanna MATSU wrote: Reason for CRM: pt stated she received a letter from [facility] stating something about her Pirfenidone  801 MG TABS medication and they will not be mailing out any longer, but she is not sure if this is what they are saying. She would like a call back about this. She stated Dr. Theophilus advised her she would still get the meds but will it be from same company. >> Feb 24, 2024 11:19 AM Rozanna MATSU wrote: Pt calling back about the medicine Pirfenidone  801 MG TABS  stated [employer] advised they need a fax number to send over documents so patient can start getting meds. Their phone number is [phone].   02/29/24-ATC patient x1.  LVM to return call.  When she calls back, please provide her with this information:  Per pharmacist- Patient will be receiving the prescription from a different pharmacy called [facility]. Their phone number is [phone]. Per discussion with Vida, all information was transferred over. No further action needed from patient or office unless [employer] reaches out to us .    ATC patient - LVM with [employer] number above.

## 2024-03-03 ENCOUNTER — Other Ambulatory Visit: Payer: Self-pay | Admitting: Internal Medicine

## 2024-03-03 ENCOUNTER — Ambulatory Visit: Payer: Self-pay

## 2024-03-03 DIAGNOSIS — K644 Residual hemorrhoidal skin tags: Secondary | ICD-10-CM

## 2024-03-03 NOTE — Telephone Encounter (Unsigned)
 Copied from CRM #[id number]. Topic: Clinical - Medication Refill >> Mar 03, 2024  9:12 AM Sasha M wrote: Medication: Continuous Glucose Sensor (FREESTYLE LIBRE 2 SENSOR) MISC, dronedarone  (MULTAQ ) 400 MG tablet, hydrochlorothiazide  (HYDRODIURIL ) 25 MG tablet, Pirfenidone  801 MG TABS Pt needs an updated sensor due to this one being outdated  Has the patient contacted their pharmacy? No (Agent: If no, request that the patient contact the pharmacy for the refill. If patient does not wish to contact the pharmacy document the reason why and proceed with request.) (Agent: If yes, when and what did the pharmacy advise?)  This is the patient's preferred pharmacy:  [facility] [address] ([address] [place] - [address] [place] ([place]) [place] [address] Phone: [phone] Fax: [phone]  Is this the correct pharmacy for this prescription? Yes If no, delete pharmacy and type the correct one.   Has the prescription been filled recently? No  Is the patient out of the medication? No  Has the patient been seen for an appointment in the last year OR does the patient have an upcoming appointment? Yes  Can we respond through MyChart? No, phone call preferred  Agent: Please be advised that Rx refills may take up to 3 business days. We ask that you follow-up with your pharmacy.

## 2024-03-03 NOTE — Telephone Encounter (Signed)
 FYI Only or Action Required?: Action required by provider: clinical question for provider.pharmcy needs clarification  Patient was last seen in primary care on 09/27/2023 by Theophilus Andrews, Tully GRADE, MD.  Called Nurse Triage reporting No chief complaint on file..  Symptoms began today.  Interventions attempted: Nothing.  Symptoms are: unchanged.  Triage Disposition: Call PCP Now  Patient/caregiver understands and will follow disposition?: Yes  Copied from CRM [id number]. Topic: Clinical - Prescription Issue >> Mar 03, 2024 11:12 AM Drema MATSU wrote: Reason for CRM: Garrel Harpin states that insurance is not paying for Continuous Glucose Sensor (FREESTYLE LIBRE 2 SENSOR) MISC due to the directions. He stated that the direction should be spelled out. He said it should read change out every 14 days not as directed. Patient is at Pharmacy. Reason for Disposition  [1] Pharmacy calling with prescription question AND [2] triager unable to answer question  Answer Assessment - Initial Assessment Questions Pharmacist needs med clarification  Protocols used: Medication Question Call-A-AH

## 2024-03-06 MED ORDER — FREESTYLE LIBRE 3 PLUS SENSOR MISC: 3 refills | Status: DC

## 2024-03-06 NOTE — Addendum Note (Signed)
 Addended by: KATHRYNE MILLMAN B on: 03/06/2024 12:17 PM   Modules accepted: Orders

## 2024-03-06 NOTE — Addendum Note (Signed)
 Addended by: VANICE LUCIENNE PARAS on: 03/06/2024 01:18 PM   Modules accepted: Orders

## 2024-03-06 NOTE — Telephone Encounter (Signed)
 SABRA

## 2024-03-06 NOTE — Telephone Encounter (Signed)
 Medication filled.

## 2024-03-13 ENCOUNTER — Other Ambulatory Visit: Payer: Self-pay | Admitting: Internal Medicine

## 2024-03-13 ENCOUNTER — Other Ambulatory Visit: Payer: Self-pay

## 2024-03-13 DIAGNOSIS — F419 Anxiety disorder, unspecified: Secondary | ICD-10-CM

## 2024-03-13 DIAGNOSIS — G4733 Obstructive sleep apnea (adult) (pediatric): Secondary | ICD-10-CM

## 2024-03-13 DIAGNOSIS — I480 Paroxysmal atrial fibrillation: Secondary | ICD-10-CM

## 2024-03-13 NOTE — Telephone Encounter (Addendum)
 Patient is coming to clinic tomorrow to fill paperwork for Esbriet .

## 2024-03-13 NOTE — Progress Notes (Signed)
 Patient will come in tomorrow to fill out paperwork for Esberit.

## 2024-03-17 ENCOUNTER — Other Ambulatory Visit (HOSPITAL_COMMUNITY): Payer: Self-pay | Admitting: *Deleted

## 2024-03-17 ENCOUNTER — Telehealth: Payer: Self-pay

## 2024-03-17 ENCOUNTER — Ambulatory Visit: Payer: Self-pay

## 2024-03-17 DIAGNOSIS — I480 Paroxysmal atrial fibrillation: Secondary | ICD-10-CM

## 2024-03-17 MED ORDER — FREESTYLE LIBRE 3 PLUS SENSOR MISC: 3 refills | Status: AC

## 2024-03-17 MED ORDER — MULTAQ 400 MG PO TABS: 400.0000 mg | ORAL_TABLET | Freq: Two times a day (BID) | ORAL | 3 refills | Status: AC

## 2024-03-17 NOTE — Telephone Encounter (Signed)
"  Rx  done   "

## 2024-03-17 NOTE — Telephone Encounter (Signed)
 Unclear who called pt and in regard to what. Pt should be able to continue filling Esbriet  through [employer]. LVM with pt to discuss.

## 2024-03-17 NOTE — Progress Notes (Signed)
 Miranda [name]                                          MRN: [id number]   03/17/2024   The [facility] Quality Team Specialist reviewed this patient medical record for the purposes of chart review for care gap closure. The following were reviewed: chart review for care gap closure-controlling blood pressure.    [facility] Quality Team

## 2024-03-17 NOTE — Addendum Note (Signed)
 Addended by: CHRISTYNE IDELL DRAGON A on: 03/17/2024 11:55 AM   Modules accepted: Orders

## 2024-03-17 NOTE — Telephone Encounter (Signed)
 Spoke with the patient and advised she seek treatment at an urgent care as we do not have any openings today.  Patient stated she would prefer to await the visit with PCP on 1/7 and will go to an urgent care for evaluation if symptoms worsen.

## 2024-03-17 NOTE — Telephone Encounter (Signed)
 FYI Only or Action Required?: FYI only for provider: appointment scheduled on 03/22/2024 at 9:30 AM.  Patient was last seen in primary care on 09/27/2023 by Theophilus Andrews, Tully GRADE, MD.  Called Nurse Triage reporting Leg Pain.  Symptoms began a week ago.  Interventions attempted: Rest, hydration, or home remedies.  Symptoms are: unchanged.  Triage Disposition: See PCP Within 2 Weeks  Patient/caregiver understands and will follow disposition?: Yes  Copied from CRM [id number]. Topic: Clinical - Red Word Triage >> Mar 17, 2024 11:04 AM Robinson DEL wrote: Red Word that prompted transfer to Nurse Triage: Clemens last Saturday and hurt leg, put ice on it and swelling gone down but hurts to the touch really bad. Reason for Disposition  [1] MILD pain (e.g., does not interfere with normal activities) AND [2] present > 7 days  Answer Assessment - Initial Assessment Questions Patient tripped getting out of bed and hit her left leg on the floor last Saturday. Developed swelling in the lower part of the leg. Reports using ice on the leg. No swelling currently. Patient endorses pain to the leg when she touches a certain part of the leg.   1. ONSET: When did the pain start?      Started last Saturday 2. LOCATION: Where is the pain located?      Left leg 3. PAIN: How bad is the pain?    (Scale 1-10; or mild, moderate, severe)     3-4 out of 10 4. WORK OR EXERCISE: Has there been any recent work or exercise that involved this part of the body?      no 5. CAUSE: What do you think is causing the leg pain?     unsure 6. OTHER SYMPTOMS: Do you have any other symptoms? (e.g., chest pain, back pain, breathing difficulty, swelling, rash, fever, numbness, weakness)     no  Protocols used: Leg Pain-A-AH

## 2024-03-17 NOTE — Telephone Encounter (Signed)
 Copied from CRM [id number]. Topic: Clinical - Medication Question >> Mar 15, 2024  2:23 PM Ismael A wrote: Reason for CRM: pt states she came into the office yesterday to fill out paperwork regarding Pirfenidone  801 MG TABS, stated she received a call yesterday but was unable to answer, she is requesting a call back if there are any updates that need to be relayed   Routing to pharmacy regarding Pirfenidone 

## 2024-03-17 NOTE — Telephone Encounter (Signed)
 Copied from CRM [id number]. Topic: Clinical - Prescription Issue >> Mar 17, 2024 10:59 AM Robinson DEL wrote: Reason for CRM: Patient states a new prescription for the Continuous Glucose Sensor (FREESTYLE LIBRE 3 PLUS SENSOR) MISC will have  to be sent to the [facility] on file. States after the 17th the [facility] pharmacy will be out of network.  [place] [phone]

## 2024-03-20 ENCOUNTER — Encounter (HOSPITAL_COMMUNITY): Payer: Self-pay

## 2024-03-20 ENCOUNTER — Emergency Department (HOSPITAL_COMMUNITY)

## 2024-03-20 ENCOUNTER — Telehealth: Payer: Self-pay

## 2024-03-20 ENCOUNTER — Other Ambulatory Visit: Payer: Self-pay

## 2024-03-20 ENCOUNTER — Emergency Department (HOSPITAL_COMMUNITY)
Admission: EM | Admit: 2024-03-20 | Discharge: 2024-03-20 | Disposition: A | Discharge status: Home / Self Care | Attending: Emergency Medicine | Admitting: Emergency Medicine

## 2024-03-20 DIAGNOSIS — E1122 Type 2 diabetes mellitus with diabetic chronic kidney disease: Secondary | ICD-10-CM | POA: Diagnosis not present

## 2024-03-20 DIAGNOSIS — I480 Paroxysmal atrial fibrillation: Secondary | ICD-10-CM

## 2024-03-20 DIAGNOSIS — Z7984 Long term (current) use of oral hypoglycemic drugs: Secondary | ICD-10-CM | POA: Insufficient documentation

## 2024-03-20 DIAGNOSIS — N189 Chronic kidney disease, unspecified: Secondary | ICD-10-CM | POA: Diagnosis not present

## 2024-03-20 DIAGNOSIS — J45909 Unspecified asthma, uncomplicated: Secondary | ICD-10-CM | POA: Insufficient documentation

## 2024-03-20 DIAGNOSIS — I130 Hypertensive heart and chronic kidney disease with heart failure and stage 1 through stage 4 chronic kidney disease, or unspecified chronic kidney disease: Secondary | ICD-10-CM | POA: Insufficient documentation

## 2024-03-20 DIAGNOSIS — Z7901 Long term (current) use of anticoagulants: Secondary | ICD-10-CM | POA: Insufficient documentation

## 2024-03-20 DIAGNOSIS — Z79899 Other long term (current) drug therapy: Secondary | ICD-10-CM | POA: Insufficient documentation

## 2024-03-20 DIAGNOSIS — I509 Heart failure, unspecified: Secondary | ICD-10-CM | POA: Insufficient documentation

## 2024-03-20 DIAGNOSIS — I2510 Atherosclerotic heart disease of native coronary artery without angina pectoris: Secondary | ICD-10-CM | POA: Insufficient documentation

## 2024-03-20 DIAGNOSIS — R002 Palpitations: Secondary | ICD-10-CM | POA: Diagnosis present

## 2024-03-20 DIAGNOSIS — I4891 Unspecified atrial fibrillation: Secondary | ICD-10-CM

## 2024-03-20 LAB — BASIC METABOLIC PANEL WITH GFR
Anion gap: 8 (ref 5–15)
BUN: 21 mg/dL (ref 8–23)
CO2: 27 mmol/L (ref 22–32)
Calcium: 8.8 mg/dL — ABNORMAL LOW (ref 8.9–10.3)
Chloride: 104 mmol/L (ref 98–111)
Creatinine, Ser: 0.94 mg/dL (ref 0.44–1.00)
GFR, Estimated: >60 mL/min
Glucose, Bld: 92 mg/dL (ref 70–99)
Potassium: 3.5 mmol/L (ref 3.5–5.1)
Sodium: 139 mmol/L (ref 135–145)

## 2024-03-20 LAB — CBC
HCT: 38.7 % (ref 36.0–46.0)
Hemoglobin: 12.3 g/dL (ref 12.0–15.0)
MCH: 28.5 pg (ref 26.0–34.0)
MCHC: 31.8 g/dL (ref 30.0–36.0)
MCV: 89.8 fL (ref 80.0–100.0)
Platelets: 156 K/uL (ref 150–400)
RBC: 4.31 MIL/uL (ref 3.87–5.11)
RDW: 13.9 % (ref 11.5–15.5)
WBC: 6.1 K/uL (ref 4.0–10.5)
nRBC: 0 % (ref 0.0–0.2)

## 2024-03-20 LAB — TROPONIN T, HIGH SENSITIVITY
Troponin T High Sensitivity: 16 ng/L (ref 0–19)
Troponin T High Sensitivity: 21 ng/L — ABNORMAL HIGH (ref 0–19)

## 2024-03-20 LAB — CBG MONITORING, ED: Glucose-Capillary: 78 mg/dL (ref 70–99)

## 2024-03-20 MED ORDER — CARVEDILOL 3.125 MG PO TABS: 3.1250 mg | ORAL_TABLET | Freq: Two times a day (BID) | ORAL | Status: DC

## 2024-03-20 MED ORDER — CARVEDILOL 3.125 MG PO TABS: 3.1250 mg | ORAL_TABLET | Freq: Two times a day (BID) | ORAL | 1 refills | Status: AC

## 2024-03-20 MED ORDER — METOPROLOL TARTRATE 25 MG PO TABS: 25.0000 mg | ORAL_TABLET | Freq: Two times a day (BID) | ORAL | Status: DC

## 2024-03-20 MED ORDER — DILTIAZEM HCL 25 MG/5ML IV SOLN
10.0000 mg | Freq: Once | INTRAVENOUS | Status: AC
  Administered 2024-03-20: 10 mg via INTRAVENOUS
  Filled 2024-03-20: qty 5

## 2024-03-20 NOTE — ED Notes (Signed)
 Pt requesting flexeril  10 mg po at this time. Provider made aware of request.

## 2024-03-20 NOTE — Telephone Encounter (Signed)
 Copied from CRM [id number]. Topic: Clinical - Prescription Issue >> Mar 20, 2024 11:30 AM Rea ORN wrote: Reason for CRM: Pt stated the pharmacy said PCP sent an rx for [name] 2 sensor but she uses Libre 3. Please send a new rx for Libre 3 to [id number] [place] ([place]), [place] - [address]  Please call back to advise once rx resent to pharmacy, [phone]

## 2024-03-20 NOTE — Discharge Instructions (Addendum)
 You were seen in the emergency department for fast heart rate You were evaluated by our cardiology team They want you to start taking carvedilol  3.125 mg twice daily.  Do not take this medication if your heart rate is less than 60.   We have called in a new prescription for you to pick up in your pharmacy Continue taking all other previously prescribed medications Follow-up in the A-fib clinic on January 15 at the address above Return to the emergency room for chest pain trouble breathing or persistently fast heart rate

## 2024-03-20 NOTE — Consult Note (Addendum)
 "  Cardiology Consultation   Patient ID: [name] MRN: [id number]; DOB: 1945-06-06  Admit date: 03/20/2024 Date of Consult: 03/20/2024  PCP:  Theophilus Andrews, Tully GRADE, MD   [facility] Providers Cardiologist:  Peter Jordan, MD  Electrophysiologist:  Soyla Gladis Norton, MD      Patient Profile: Miranda [name] is a 79 y.o. female with a hx of paroxysmal atrial fibrillation, CHF, diabetes, asthma, CKD, history of PE, depression, hyperlipidemia, hypertension, OSA who is being seen 03/20/2024 for the evaluation of atrial fibrillation at the request of Dr. Pamella.  History of Present Illness: Ms. Miranda [name] is a 79 year old female with above medical history who is followed by Dr. Jordan and Dr. Norton with EP.  Patient previously had a stress test in 05/2014 that showed normal perfusion, normal wall motion.  Diagnosed with an acute PE in 04/2016 and treated with a 28-month course of Xarelto .  CT in 04/2016 revealed interstitial lung disease as well as aortic atherosclerosis and two-vessel CAD.  Echocardiogram in 04/2016 showed EF 60-65%, grade 1 DD, moderate LAE.  Repeat CTA of chest in 09/2017 showed resolution of PE.  Patient had an episode of atrial fibrillation in 09/2017.  She spontaneously converted in the emergency room.  Was restarted on Xarelto  and metoprolol .  Echocardiogram in 10/2017 showed EF 60-65%, no wall motion abnormalities, grade 1 DD.  Stress test in 10/2017 was a normal, low risk study.  Patient wore a Holter monitor in 03/2018 that showed predominantly normal sinus rhythm with average heart rate 59 bpm.  Rare PACs and PVCs noted, no atrial fibrillation.   Patient admitted in 03/2021 with chest pain and shortness of breath, found to have AKI.  Given IV fluids.  Echo in 03/2021 showed EF 55-60%, no wall motion abnormalities, grade 1 DD, normal RV systolic function.    Seen in the hospital in 07/2022 for A-fib.  Heart rate was well-controlled with heart rate in the 30s-50s.   Due to bradycardia, her beta-blocker was discontinued.  She remained on Multaq  and anticoagulation.  Last seen by cardiology on 07/19/2023.  At that time, patient was doing well on Multaq  and Xarelto .  BP well-controlled.  Most recent echo from 12/2023 showed EF 60-65%, no wall motion abnormalities, normal RV systolic function, moderately elevated PA systolic pressure.  She is also followed by pulmonology for pulmonary fibrosis (IPF).  She also has known history of OSA, uses CPAP inconsistently.  Presented to the ED on 1/5 around 3 AM complaining of high heart rate.  Reported that she was trying to break up an argument between family members and she noticed her heart rate was elevated.  Also had chest discomfort, mild shortness of breath.  Initial vital signs in the ED showed heart rate 124 bpm, BP 100/86, oxygen 100% on room air.EKG showed atrial fibrillation with heart rate 118 bpm.  High-sensitivity troponin 16> 21.  CBC within normal limits, BMP normal aside from calcium  8.8.  Chest x-ray showed no acute cardiopulmonary findings, mild cardiomegaly without evidence of CHF, stable fibrotic changes in the lung bases.  Patient has not been taking her Xarelto  so she was unable to be cardioverted in the ED.  Cardiology consulted  On interview, patient tells me that she has had a fluttering feeling in her chest intermittently for the past 3 days or so.  Symptoms tend to worsen with stress or anxiety.  If she rests, eventually the palpitations die down on their own.  She also has some chest  soreness which is reproducible on palpation.  Yesterday evening, her family was arguing and she was trying to break it up.  She noticed that the palpitations in her chest became much stronger and she felt short of breath and dizzy.  Since coming to the ED, her symptoms have overall improved.  She has mild palpitations, but otherwise feels much better.  She tells me that she is compliant with her medications, though on Sunday  she did miss a dose of Xarelto  and Multaq .   Past Medical History:  Diagnosis Date   Anemia    Anxiety    Arthritis    Asthma    Atrial fibrillation (HCC)    CHF (congestive heart failure) (HCC)    pt. unsure- but thinks she was hosp. for CHF- 2002   [facility]   Clotting disorder    blood clots in lungs/PE pulmonary embolism   Colon polyps    Complication of anesthesia    states requires a lot med. to put her to sleep    DDD (degenerative disc disease) 09/17/2011   Depression    sometimes    DM (diabetes mellitus) (HCC)    Family history of anesthesia complication    Sister had difficulty waking up from anesthesia   GERD (gastroesophageal reflux disease)    Glaucoma    bilateral, pt. admits that she is noncompliant to eye gtts.    Hemorrhoids    Hyperlipidemia    Hypertension    had stress, echo- 2006 /w [place], Cardiac Cath, per pt. 2002, echo repeated 2012- wnl    IPF (idiopathic pulmonary fibrosis) (HCC)    Low back pain    Shortness of breath    Sleep apnea    uses c-pap- q night recently    Past Surgical History:  Procedure Laterality Date   ABDOMINAL HYSTERECTOMY     ectopic, fibroids   ANTERIOR CERVICAL DECOMP/DISCECTOMY FUSION N/A 09/11/2020   Procedure: Anterior Cervical Decompression Fusion Cervical four-five, Cervical five-six;  Surgeon: Debby Dorn MATSU, MD;  Location: [facility] OR;  Service: Neurosurgery;  Laterality: N/A;   arm surgery Right    CARDIAC CATHETERIZATION     CATARACT EXTRACTION, BILATERAL     cataracts removed bilateral- ?IOL   COLONOSCOPY  11/2020   HD-MAC-prep good-TA   FISSURECTOMY  10/08/2011   Procedure: FISSURECTOMY;  Surgeon: Jina Nephew, MD;  Location: [facility] OR;  Service: General;  Laterality: N/A;   FLEXIBLE SIGMOIDOSCOPY  02/25/2011   Procedure: ENID MORIN;  Surgeon: [name] JONETTA Aho, MD;  Location: [facility] ENDOSCOPY;  Service: Endoscopy;  Laterality: N/A;   FOOT SURGERY     bilat,  heel spurs- screw in R foot    HEMORRHOID SURGERY  10/08/2011   Procedure: HEMORRHOIDECTOMY;  Surgeon: Jina Nephew, MD;  Location: [facility] OR;  Service: General;  Laterality: N/A;  External    REVERSE SHOULDER ARTHROPLASTY Left 08/13/2022   Procedure: REVERSE SHOULDER ARTHROPLASTY;  Surgeon: Melita Drivers, MD;  Location: [facility] ORS;  Service: Orthopedics;  Laterality: Left;    ROTATOR CUFF REPAIR Right    SPHINCTEROTOMY  10/08/2011   Procedure: SPHINCTEROTOMY;  Surgeon: Jina Nephew, MD;  Location: [facility] OR;  Service: General;  Laterality: N/A;     Home Medications:  Prior to Admission medications  Medication Sig Start Date End Date Taking? Authorizing Provider  acetaminophen  (TYLENOL ) 325 MG tablet Take 650 mg by mouth every 6 (six) hours as needed for moderate pain.    [provider]  albuterol  (VENTOLIN  HFA) 108 (90 Base) MCG/ACT inhaler Inhale 1 puff into the lungs every 6 (six) hours as needed. 06/23/19   Theophilus Andrews, Tully GRADE, MD  ALPRAZolam  (XANAX ) 0.25 MG tablet Take 1 tablet by mouth three times daily as needed 03/14/24   Theophilus Andrews, Tully GRADE, MD  amLODipine  (NORVASC ) 5 MG tablet Take 1 tablet (5 mg total) by mouth daily. 09/27/23   Theophilus Andrews, Tully GRADE, MD  atorvastatin  (LIPITOR ) 80 MG tablet Take 1 tablet (80 mg total) by mouth every evening. 12/28/23   Theophilus Andrews, Tully GRADE, MD  bisacodyl  (BISACODYL  LAXATIVE) 10 MG suppository Place 1 suppository (10 mg total) rectally daily as needed for moderate constipation. 09/01/23   Jordan, Betty G, MD  Continuous Glucose Sensor (FREESTYLE LIBRE 3 PLUS SENSOR) MISC Change sensor every 15 days. 03/17/24   Theophilus Andrews, Tully GRADE, MD  cyclobenzaprine  (FLEXERIL ) 10 MG tablet Take 1 tablet (10 mg total) by mouth 3 (three) times daily as needed for muscle spasms. 09/27/23   Theophilus Andrews, Tully GRADE, MD  docusate sodium  (COLACE) 100 MG capsule Take 1 capsule (100 mg total) by mouth 2 (two) times daily. 09/27/23   Theophilus Andrews, Tully GRADE, MD  dronedarone  (MULTAQ ) 400 MG tablet Take 1 tablet (400 mg total) by mouth 2 (two) times daily with a meal. 03/17/24   Terra Fairy PARAS, PA-C  ezetimibe  (ZETIA ) 10 MG tablet Take 1 tablet (10 mg total) by mouth daily. 09/27/23   Theophilus Andrews, Tully GRADE, MD  hydrochlorothiazide  (HYDRODIURIL ) 25 MG tablet Take 1 tablet (25 mg total) by mouth daily. 12/28/23   Theophilus Andrews, Tully GRADE, MD  HYDROcodone -acetaminophen  [facility]) 10-325 MG tablet Take 1 tablet by mouth every 6 (six) hours as needed. 08/14/22   Shuford, Randine, PA-C  lactulose  (CHRONULAC ) 10 GM/15ML solution Take 30 g by mouth 2 (two) times daily as needed. 07/05/23   [provider]  losartan  (COZAAR ) 100 MG tablet Take 1 tablet (100 mg total) by mouth every evening. 12/28/23   Theophilus Andrews, Tully GRADE, MD  metFORMIN  (GLUCOPHAGE -XR) 500 MG 24 hr tablet Take 1 tablet (500 mg total) by mouth daily with breakfast. 10/18/23   Theophilus Andrews, Tully GRADE, MD  nitroGLYCERIN (NITRODUR - DOSED IN MG/24 HR) 0.2 mg/hr patch Place 0.2 mg onto the skin daily. 05/28/23   [provider]  Pirfenidone  801 MG TABS Take 1 tablet (801 mg total) by mouth 3 (three) times daily. 12/02/23   Mannam, Praveen, MD  PROCTO-MED HC  2.5 % rectal cream PLACE 1 APPLICATION RECTALLY 2 (TIMES) DAILY. 03/06/24   Theophilus Andrews, Tully GRADE, MD  Prucalopride Succinate  2 MG TABS TAKE 1 TABLET BY MOUTH  ONCE DAILY AT [facility] 02/28/24   Theophilus Andrews, Tully GRADE, MD  XARELTO  20 MG TABS tablet TAKE 1 TABLET BY MOUTH ONCE DAILY IN THE EVENING 11/01/23   Theophilus Andrews, Tully GRADE, MD    Scheduled Meds:  Continuous Infusions:  PRN Meds:   Allergies:   Allergies[1]  Social History:   Social History   Socioeconomic History   Marital status: Widowed    Spouse name: Not on file   Number of children: 4   Years of education: Not on file   Highest education level: Not on file  Occupational History   Occupation: retired Scientist, Research (medical):  UNEMPLOYED  Tobacco Use   Smoking status: Never    Passive exposure: Never   Smokeless tobacco: Never   Tobacco comments:  Never smoked 04/22/23  Vaping Use   Vaping status: Never Used  Substance and Sexual Activity   Alcohol use: No    Alcohol/week: 0.0 standard drinks of alcohol   Drug use: No   Sexual activity: Never    Comment: hyst  Other Topics Concern   Not on file  Social History Narrative   Not on file   Social Drivers of Health   Tobacco Use: Low Risk (03/20/2024)   Patient History    Smoking Tobacco Use: Never    Smokeless Tobacco Use: Never    Passive Exposure: Never  Financial Resource Strain: Low Risk (01/09/2022)   Overall Financial Resource Strain (CARDIA)    Difficulty of Paying Living Expenses: Not very hard  Food Insecurity: No Food Insecurity (08/13/2022)   Hunger Vital Sign    Worried About Running Out of Food in the Last Year: Never true    Ran Out of Food in the Last Year: Never true  Transportation Needs: No Transportation Needs (08/13/2022)   PRAPARE - Administrator, Civil Service (Medical): No    Lack of Transportation (Non-Medical): No  Physical Activity: Not on file  Stress: Not on file  Social Connections: Not on file  Intimate Partner Violence: Not At Risk (08/13/2022)   Humiliation, Afraid, Rape, and Kick questionnaire    Fear of Current or Ex-Partner: No    Emotionally Abused: No    Physically Abused: No    Sexually Abused: No  Depression (PHQ2-9): Low Risk (05/20/2023)   Depression (PHQ2-9)    PHQ-2 Score: 1  Alcohol Screen: Not on file  Housing: Low Risk (08/13/2022)   Housing    Last Housing Risk Score: 0  Utilities: Not At Risk (08/13/2022)   AHC Utilities    Threatened with loss of utilities: No  Health Literacy: Not on file    Family History:   Family History  Problem Relation Age of Onset   Heart attack Mother    Heart disease Mother    Breast cancer Mother    Colon polyps Sister    Emphysema Sister     Arthritis/Rheumatoid Sister    Colon polyps Sister    Asthma Sister    Lung cancer Sister    COPD Sister    Breast cancer Sister    Prostate cancer Brother    Colon polyps Brother    Colon cancer Brother    Anesthesia problems Neg Hx    Esophageal cancer Neg Hx    Rectal cancer Neg Hx    Stomach cancer Neg Hx      ROS:  Please see the history of present illness.  All other ROS reviewed and negative.     Physical Exam/Data: Vitals:   03/20/24 0600 03/20/24 0615 03/20/24 0834 03/20/24 0845  BP: 110/73 129/67  126/68  Pulse: 63 76  91  Resp: 20 14  18   Temp:   98.5 F (36.9 C)   TempSrc:   Oral   SpO2: 100% 100%  100%  Weight:      Height:       No intake or output data in the 24 hours ending 03/20/24 1051    03/20/2024    3:23 AM 12/29/2023   10:31 AM 12/16/2023   11:04 AM  Last 3 Weights  Weight (lbs) 164 lb 165 lb 165 lb  Weight (kg) 74.39 kg 74.844 kg 74.844 kg     Body mass index is 27.29 kg/m.  General:  Well nourished, well  developed, in no acute distress. Laying flat in the bed  HEENT: normal Neck: no JVD Vascular: Radial pulses 2+ bilaterally Cardiac:  normal S1, S2; irregular rate and rhythm, grade 1/6 systolic murmur  Lungs:  Fine crackles in bilateral lung bases, normal WOB on room air  Abd: soft, nontender, no hepatomegaly  Ext: no edema in BLE  Musculoskeletal:  No deformities  Skin: warm and dry  Neuro:  CNs 2-12 intact, no focal abnormalities noted Psych:  Normal affect   EKG:  The EKG was personally reviewed and demonstrates: Atrial fibrillation with heart rate 118 bpm, PVC present Telemetry:  Telemetry was personally reviewed and demonstrates: Atrial fibrillation with heart rate in the 90s-100s  Relevant CV Studies: Cardiac Studies & Procedures   ______________________________________________________________________________________________   STRESS TESTS  MYOCARDIAL PERFUSION IMAGING 11/12/2017  Interpretation Summary  Nuclear stress  EF: 66%.  There was no ST segment deviation noted during stress.  No T wave inversion was noted during stress.  The study is normal.  This is a low risk study.  The left ventricular ejection fraction is hyperdynamic (>65%).   ECHOCARDIOGRAM  ECHOCARDIOGRAM COMPLETE 01/11/2024  Narrative ECHOCARDIOGRAM REPORT    Patient Name:   ACHOL AZPEITIA Mortimer Date of Exam: 01/11/2024 Medical Rec #:  [id number]        Height:       65.0 in Accession #:    [id number]       Weight:       165.0 lb Date of Birth:  Feb 14, 1946        BSA:          1.823 m Patient Age:    78 years         BP:           130/94 mmHg Patient Gender: F                HR:           48 bpm. Exam Location:  [address]  Procedure: 2D Echo, Cardiac Doppler and Color Doppler (Both Spectral and Color Flow Doppler were utilized during procedure).  Indications:    R06.00 Dyspnea  History:        Patient has prior history of Echocardiogram examinations, most recent 04/14/2021. CHF, Arrythmias:Atrial Fibrillation, Signs/Symptoms:Shortness of Breath; Risk Factors:Diabetes, Dyslipidemia, Sleep Apnea and Hypertension.  Sonographer:    Carl Coma RDCS Referring Phys: [id number] PRAVEEN MANNAM  IMPRESSIONS   1. Left ventricular ejection fraction, by estimation, is 60 to 65%. The left ventricle has normal function. The left ventricle has no regional wall motion abnormalities. There is mild concentric left ventricular hypertrophy. Left ventricular diastolic parameters are indeterminate. 2. Right ventricular systolic function is normal. The right ventricular size is normal. There is moderately elevated pulmonary artery systolic pressure. 3. Left atrial size was mildly dilated. 4. The mitral valve is normal in structure. Trivial mitral valve regurgitation. No evidence of mitral stenosis. 5. The aortic valve is tricuspid. Aortic valve regurgitation is not visualized. No aortic stenosis is present. 6. The inferior vena  cava is normal in size with greater than 50% respiratory variability, suggesting right atrial pressure of 3 mmHg.  FINDINGS Left Ventricle: Left ventricular ejection fraction, by estimation, is 60 to 65%. The left ventricle has normal function. The left ventricle has no regional wall motion abnormalities. The left ventricular internal cavity size was normal in size. There is mild concentric left ventricular hypertrophy. Left ventricular diastolic parameters are indeterminate. Indeterminate filling pressures.  Right  Ventricle: The right ventricular size is normal. No increase in right ventricular wall thickness. Right ventricular systolic function is normal. There is moderately elevated pulmonary artery systolic pressure. The tricuspid regurgitant velocity is 3.32 m/s, and with an assumed right atrial pressure of 3 mmHg, the estimated right ventricular systolic pressure is 47.1 mmHg.  Left Atrium: Left atrial size was mildly dilated.  Right Atrium: Right atrial size was normal in size.  Pericardium: There is no evidence of pericardial effusion.  Mitral Valve: The mitral valve is normal in structure. Trivial mitral valve regurgitation. No evidence of mitral valve stenosis.  Tricuspid Valve: The tricuspid valve is normal in structure. Tricuspid valve regurgitation is mild . No evidence of tricuspid stenosis.  The aortic valve is tricuspid. Aortic valve regurgitation is not visualized. No aortic stenosis is present. Pulmonic Valve: The pulmonic valve was normal in structure. Pulmonic valve regurgitation is not visualized. No evidence of pulmonic stenosis.  Aorta: The aortic root is normal in size and structure.  Venous: The inferior vena cava is normal in size with greater than 50% respiratory variability, suggesting right atrial pressure of 3 mmHg.  IAS/Shunts: No atrial level shunt detected by color flow Doppler.   LEFT VENTRICLE PLAX 2D LVIDd:         4.60 cm   Diastology LVIDs:          2.50 cm   LV e' medial:    6.57 cm/s LV PW:         1.10 cm   LV E/e' medial:  15.2 LV IVS:        1.10 cm   LV e' lateral:   10.50 cm/s LVOT diam:     1.90 cm   LV E/e' lateral: 9.5 LV SV:         70 LV SV Index:   38 LVOT Area:     2.84 cm   RIGHT VENTRICLE             IVC RV Basal diam:  4.20 cm     IVC diam: 1.50 cm RV S prime:     15.70 cm/s TAPSE (M-mode): 2.7 cm  LEFT ATRIUM             Index        RIGHT ATRIUM           Index LA diam:        4.90 cm 2.69 cm/m   RA Area:     11.30 cm LA Vol (A2C):   90.7 ml 49.76 ml/m  RA Volume:   24.60 ml  13.50 ml/m LA Vol (A4C):   55.3 ml 30.34 ml/m LA Biplane Vol: 76.8 ml 42.13 ml/m AORTIC VALVE  AV Peak Grad:      15.1 mmHg AV Mean Grad:      8.0 mmHg LVOT Vmax:         101.60 cm/s LVOT Vmean:        63.350 cm/s LVOT VTI:          0.246 m LVOT/AV VTI ratio: 0.51  AORTA Ao Root diam: 2.70 cm Ao Asc diam:  3.10 cm  MITRAL VALVE               TRICUSPID VALVE MV Area (PHT): 3.81 cm    TR Peak grad:   44.1 mmHg MV Decel Time: 199 msec    TR Vmax:        332.00 cm/s MV E velocity: 99.65 cm/s MV A velocity:  78.25 cm/s  SHUNTS MV E/A ratio:  1.27        Systemic VTI:  0.25 m Systemic Diam: 1.90 cm  Annabella Scarce MD Electronically signed by Annabella Scarce MD Signature Date/Time: 01/11/2024/6:15:17 PM    Final          ______________________________________________________________________________________________       Laboratory Data: High Sensitivity Troponin:  No results for input(s): TROPONINIHS in the last 720 hours.  Recent Labs  Lab 03/20/24 0331 03/20/24 0528  TRNPT 16 21*      Chemistry Recent Labs  Lab 03/20/24 0331  NA 139  K 3.5  CL 104  CO2 27  GLUCOSE 92  BUN 21  CREATININE 0.94  CALCIUM  8.8*  GFRNONAA >60  ANIONGAP 8    No results for input(s): PROT, ALBUMIN, AST, ALT, ALKPHOS, BILITOT in the last 168 hours. Lipids No results for input(s): CHOL, TRIG,  HDL, LABVLDL, LDLCALC, CHOLHDL in the last 168 hours.  Hematology Recent Labs  Lab 03/20/24 0331  WBC 6.1  RBC 4.31  HGB 12.3  HCT 38.7  MCV 89.8  MCH 28.5  MCHC 31.8  RDW 13.9  PLT 156   Thyroid  No results for input(s): TSH, FREET4 in the last 168 hours.  BNPNo results for input(s): BNP, PROBNP in the last 168 hours.  DDimer No results for input(s): DDIMER in the last 168 hours.  Radiology/Studies:  DG Chest Port 1 View Result Date: 03/20/2024 EXAM: 1 VIEW(S) XRAY OF THE CHEST 03/20/2024 03:49:19 AM COMPARISON: HRCT study 06/28/2023. CLINICAL HISTORY: cp cp FINDINGS: LINES, TUBES AND DEVICES: Overlying telemetry leads. LUNGS AND PLEURA: Fibrotic changes are again noted in the lung bases. The lungs are clear of infiltrates. No pleural effusion. No pneumothorax. HEART AND MEDIASTINUM: Mild cardiomegaly without evidence of CHF. The mediastinum is stable. The aorta is tortuous with patchy calcification. BONES AND SOFT TISSUES: Left shoulder reverse arthroplasty. Osteopenia. IMPRESSION: 1. No acute cardiopulmonary findings. 2. Mild cardiomegaly without evidence of CHF. 3. Stable fibrotic changes in the lung bases. Electronically signed by: Francis Quam MD 03/20/2024 04:05 AM EST RP Workstation: [id number]     Assessment and Plan:  PAF - Patient has known history of the AF, has been on Multaq  and Xarelto .  Missed a dose of both medications within the past week - Presented with palpitations.  Found to be back in A-fib.  Initially, heart rate in the 120s-130s.  Currently in the 90s-100s - When heart rate is in the 90s-100s, patient does not have palpitations.  Tells me that she overall feels well - In the past, beta-blocker had to be discontinued due to bradycardia. - Start carvedilol  3.125 mg BID.  Instructed patient to not take the medication if her heart rate is less than 60 bpm - Continue dronedarone  400 mg BID  - Continue xarelto  20 mg daily  - Arranged close  follow-up with A-fib clinic.  May need an alternative medication if she does not maintain normal sinus rhythm on multaq . May need DCCV after she is compliant with [facility]   Chest pain  Coronary artery calcification  - CT chest in the past has noted coronary artery calcifications  - Patient describes chest soreness.  It is reproducible on palpation - High-sensitivity troponin 16> 21 - As pain is reproducible on palpation, very suspicious for musculoskeletal cause.  If she continues to have chest pain after discharge, could consider outpatient stress versus coronary CTA - Continue amlodipine  5 mg daily, zetia  10 mg daily, lipitor  80 mg  daily     Risk Assessment/Risk Scores:   CHA2DS2-VASc Score = 6   This indicates a 9.7% annual risk of stroke. The patient's score is based upon: CHF History: 0 HTN History: 1 Diabetes History: 1 Stroke History: 0 Vascular Disease History: 1 Age Score: 2 Gender Score: 1   For questions or updates, please contact [facility] Please consult [email or web address] for contact info under   Signed, Rollo FABIENE Louder, PA-C  03/20/2024 10:51 AM  Patient seen and examined, note reviewed with the signed Advanced Practice Provider. I personally reviewed laboratory data, imaging studies and relevant notes. I independently examined the patient and formulated the important aspects of the plan. I have personally discussed the plan with the patient and/or family. Comments or changes to the note/plan are indicated below.  Patient seen and examined at her beside.  She is doing much better since her presentation.   PAF Chest pain  Coronary artery calcification   Continue her current dose of dronedarone  400 mg BID  Continue xeralto. Agree with starting coreg  3.125 mg BID  Her symptoms are atypical for ACS, consistent with musculoskeletal pain.  We will get her in with a close follow up with the afib clinic.  From a CV standpoint she can be discharged to home.     Tomorrow Dehaas DO, MS [facility] Attending Cardiologist [facility]  [id number] [place], [place] [address] [phone] Website: [email or web address]      [1]  Allergies Allergen Reactions   Cashew Nut Oil Shortness Of Breath   Fish Oil Anaphylaxis   Other Hives, Shortness Of Breath and Swelling    Allergic to cashew nuts and peanut oil.   Peanut Oil Shortness Of Breath   Penicillins Anaphylaxis, Hives and Swelling    Has patient had a PCN reaction causing immediate rash, facial/tongue/throat swelling, SOB or lightheadedness with hypotension: Face swelling and hives started first, then swelling of the throat  Has patient had a PCN reaction causing severe rash involving mucus membranes or skin necrosis: Yes  Has patient had a PCN reaction that required hospitalization: No  Has patient had a PCN reaction occurring within the last 10 years: Yes  If all of the above answers are NO, then may proceed with Cephalospor   Pneumococcal Vaccines Nausea And Vomiting   Aspirin Itching and Rash   Bactrim  [Sulfamethoxazole -Trimethoprim ] Hives, Itching and Rash   Ciprofloxacin  Hives, Itching and Rash   Ibuprofen Rash   Influenza Vaccines Hives   Ivp Dye [Iodinated Contrast Media] Hives, Itching and Rash    Gives benadryl  to counteract symptoms   Latex Rash   Macrobid  [Nitrofurantoin  Monohydrate Macrocrystals] Hives   Nitrofurantoin  Macrocrystal Hives   Shellfish Allergy Hives    Patient also allergic to seafood   "

## 2024-03-20 NOTE — ED Provider Notes (Signed)
 " [facility] Emergency Department Provider Note MRN:  [id number]  Arrival date & time: 03/20/2024     Chief Complaint   Palpitations History of Present Illness   Miranda [name] is a 79 y.o. year-old female with a history of A-fib, CHF, diabetes presenting to the ED with chief complaint of palpitations.  Patient explains that she was trying to break up an argument between family members this evening.  Everyone was shouting at each other.  She then noticed that her heart rate was elevated.  She began having some chest discomfort that is not going away.  Mild shortness of breath, no dizziness or sweatiness, nausea or vomiting, no leg pain or swelling recently.  Review of Systems  A thorough review of systems was obtained and all systems are negative except as noted in the HPI and PMH.   Patient's Health History    Past Medical History:  Diagnosis Date   Anemia    Anxiety    Arthritis    Asthma    Atrial fibrillation (HCC)    CHF (congestive heart failure) (HCC)    pt. unsure- but thinks she was hosp. for CHF- 2002   Chronic kidney disease    [facility]   Clotting disorder    blood clots in lungs/PE pulmonary embolism   Colon polyps    Complication of anesthesia    states requires a lot med. to put her to sleep    DDD (degenerative disc disease) 09/17/2011   Depression    sometimes    DM (diabetes mellitus) (HCC)    Family history of anesthesia complication    Sister had difficulty waking up from anesthesia   GERD (gastroesophageal reflux disease)    Glaucoma    bilateral, pt. admits that she is noncompliant to eye gtts.    Hemorrhoids    Hyperlipidemia    Hypertension    had stress, echo- 2006 /w [place], Cardiac Cath, per pt. 2002, echo repeated 2012- wnl    IPF (idiopathic pulmonary fibrosis) (HCC)    Low back pain    Shortness of breath    Sleep apnea    uses c-pap- q night recently    Past Surgical History:  Procedure  Laterality Date   ABDOMINAL HYSTERECTOMY     ectopic, fibroids   ANTERIOR CERVICAL DECOMP/DISCECTOMY FUSION N/A 09/11/2020   Procedure: Anterior Cervical Decompression Fusion Cervical four-five, Cervical five-six;  Surgeon: Debby Dorn MATSU, MD;  Location: [facility] OR;  Service: Neurosurgery;  Laterality: N/A;   arm surgery Right    CARDIAC CATHETERIZATION     CATARACT EXTRACTION, BILATERAL     cataracts removed bilateral- ?IOL   COLONOSCOPY  11/2020   HD-MAC-prep good-TA   FISSURECTOMY  10/08/2011   Procedure: FISSURECTOMY;  Surgeon: Jina Nephew, MD;  Location: [facility] OR;  Service: General;  Laterality: N/A;   FLEXIBLE SIGMOIDOSCOPY  02/25/2011   Procedure: ENID MORIN;  Surgeon: [name] JONETTA Aho, MD;  Location: [facility] ENDOSCOPY;  Service: Endoscopy;  Laterality: N/A;   FOOT SURGERY     bilat, heel spurs- screw in R foot    HEMORRHOID SURGERY  10/08/2011   Procedure: HEMORRHOIDECTOMY;  Surgeon: Jina Nephew, MD;  Location: [facility] OR;  Service: General;  Laterality: N/A;  External    REVERSE SHOULDER ARTHROPLASTY Left 08/13/2022   Procedure: REVERSE SHOULDER ARTHROPLASTY;  Surgeon: Melita Drivers, MD;  Location: [facility] ORS;  Service: Orthopedics;  Laterality: Left;    ROTATOR CUFF REPAIR Right  SPHINCTEROTOMY  10/08/2011   Procedure: SPHINCTEROTOMY;  Surgeon: Jina Nephew, MD;  Location: [facility] OR;  Service: General;  Laterality: N/A;    Family History  Problem Relation Age of Onset   Heart attack Mother    Heart disease Mother    Breast cancer Mother    Colon polyps Sister    Emphysema Sister    Arthritis/Rheumatoid Sister    Colon polyps Sister    Asthma Sister    Lung cancer Sister    COPD Sister    Breast cancer Sister    Prostate cancer Brother    Colon polyps Brother    Colon cancer Brother    Anesthesia problems Neg Hx    Esophageal cancer Neg Hx    Rectal cancer Neg Hx    Stomach cancer Neg Hx     Social History   Socioeconomic History   Marital status: Widowed     Spouse name: Not on file   Number of children: 4   Years of education: Not on file   Highest education level: Not on file  Occupational History   Occupation: retired Scientist, Research (medical): UNEMPLOYED  Tobacco Use   Smoking status: Never    Passive exposure: Never   Smokeless tobacco: Never   Tobacco comments:    Never smoked 04/22/23  Vaping Use   Vaping status: Never Used  Substance and Sexual Activity   Alcohol use: No    Alcohol/week: 0.0 standard drinks of alcohol   Drug use: No   Sexual activity: Never    Comment: hyst  Other Topics Concern   Not on file  Social History Narrative   Not on file   Social Drivers of Health   Tobacco Use: Low Risk (03/20/2024)   Patient History    Smoking Tobacco Use: Never    Smokeless Tobacco Use: Never    Passive Exposure: Never  Financial Resource Strain: Low Risk (01/09/2022)   Overall Financial Resource Strain (CARDIA)    Difficulty of Paying Living Expenses: Not very hard  Food Insecurity: No Food Insecurity (08/13/2022)   Hunger Vital Sign    Worried About Running Out of Food in the Last Year: Never true    Ran Out of Food in the Last Year: Never true  Transportation Needs: No Transportation Needs (08/13/2022)   PRAPARE - Administrator, Civil Service (Medical): No    Lack of Transportation (Non-Medical): No  Physical Activity: Not on file  Stress: Not on file  Social Connections: Not on file  Intimate Partner Violence: Not At Risk (08/13/2022)   Humiliation, Afraid, Rape, and Kick questionnaire    Fear of Current or Ex-Partner: No    Emotionally Abused: No    Physically Abused: No    Sexually Abused: No  Depression (PHQ2-9): Low Risk (05/20/2023)   Depression (PHQ2-9)    PHQ-2 Score: 1  Alcohol Screen: Not on file  Housing: Low Risk (08/13/2022)   Housing    Last Housing Risk Score: 0  Utilities: Not At Risk (08/13/2022)   AHC Utilities    Threatened with loss of utilities: No  Health Literacy: Not on file      Physical Exam   Vitals:   03/20/24 0600 03/20/24 0615  BP: 110/73 129/67  Pulse: 63 76  Resp: 20 14  Temp:    SpO2: 100% 100%    CONSTITUTIONAL: Well-appearing, NAD NEURO/PSYCH:  Alert and oriented x 3, no focal deficits EYES:  eyes equal and reactive  ENT/NECK:  no LAD, no JVD CARDIO: Tachycardic rate, well-perfused, normal S1 and S2 PULM:  CTAB no wheezing or rhonchi GI/GU:  non-distended, non-tender MSK/SPINE:  No gross deformities, no edema SKIN:  no rash, atraumatic   *Additional and/or pertinent findings included in MDM below  Diagnostic and Interventional Summary    EKG Interpretation Date/Time:  Monday March 20 2024 03:26:27 EST Ventricular Rate:  118 PR Interval:    QRS Duration:  76 QT Interval:  347 QTC Calculation: 487 R Axis:   14  Text Interpretation: Atrial flutter Ventricular premature complex Borderline prolonged QT interval Confirmed by Theadore Sharper [phone]) on 03/20/2024 3:43:54 AM       Labs Reviewed  BASIC METABOLIC PANEL WITH GFR - Abnormal; Notable for the following components:      Result Value   Calcium  8.8 (*)    All other components within normal limits  TROPONIN T, HIGH SENSITIVITY - Abnormal; Notable for the following components:   Troponin T High Sensitivity 21 (*)    All other components within normal limits  CBC  TROPONIN T, HIGH SENSITIVITY    DG Chest Port 1 View  Final Result      Medications  diltiazem  (CARDIZEM ) injection 10 mg (10 mg Intravenous Given 03/20/24 0516)     Procedures  /  Critical Care .Critical Care  Performed by: Theadore Sharper HERO, MD Authorized by: Theadore Sharper HERO, MD   Critical care provider statement:    Critical care time (minutes):  32   Critical care was necessary to treat or prevent imminent or life-threatening deterioration of the following conditions: A-fib with RVR.   Critical care was time spent personally by me on the following activities:  Development of treatment plan with patient or  surrogate, discussions with consultants, evaluation of patient's response to treatment, examination of patient, ordering and review of laboratory studies, ordering and review of radiographic studies, ordering and performing treatments and interventions, pulse oximetry, re-evaluation of patient's condition and review of old charts   ED Course and Medical Decision Making  Initial Impression and Ddx Differential diagnosis includes A-fib with RVR, electrolyte disturbance, ACS, stress or anxiety.  Doubt PE, doubt dissection.  Past medical/surgical history that increases complexity of ED encounter: A-fib  Interpretation of Diagnostics I personally reviewed the EKG and my interpretation is as follows: A-fib with RVR  No significant blood count or electrolyte disturbance.  Patient Reassessment and Ultimate Disposition/Management     Cardiology consulted for medical management of this A-fib with mild RVR, patient's rate control will be difficult per Dr. Marty, plan is for morning cardiology team to weigh in, patient may need admission for cardioversion.  Signed out to oncoming provider.  Patient management required discussion with the following services or consulting groups:  Cardiology  Complexity of Problems Addressed Acute illness or injury that poses threat of life of bodily function  Additional Data Reviewed and Analyzed Further history obtained from: Further history from spouse/family member  Additional Factors Impacting ED Encounter Risk Consideration of hospitalization  Sharper HERO. Theadore, MD [facility] Emergency Medicine [facility] [email or web address]  Final Clinical Impressions(s) / ED Diagnoses     ICD-10-CM   1. Atrial fibrillation with rapid ventricular response (HCC)  I48.91       ED Discharge Orders     None        Discharge Instructions Discussed with and Provided to Patient:   Discharge Instructions   None      Theadore Sharper HERO,  MD 03/20/24 9365  "

## 2024-03-20 NOTE — ED Provider Notes (Signed)
" °  Physical Exam  BP 121/80   Pulse 60   Temp 98.5 F (36.9 C) (Oral)   Resp 20   Ht 5' 5 (1.651 m)   Wt 74.4 kg   SpO2 99%   BMI 27.29 kg/m   Physical Exam Vitals and nursing note reviewed.  HENT:     Head: Normocephalic and atraumatic.  Eyes:     Pupils: Pupils are equal, round, and reactive to light.  Cardiovascular:     Rate and Rhythm: Tachycardia present. Rhythm irregular.  Pulmonary:     Effort: Pulmonary effort is normal.     Breath sounds: Normal breath sounds.  Abdominal:     Palpations: Abdomen is soft.     Tenderness: There is no abdominal tenderness.  Skin:    General: Skin is warm and dry.  Neurological:     Mental Status: She is alert.  Psychiatric:        Mood and Affect: Mood normal.     Procedures  Procedures  ED Course / MDM   Clinical Course as of 03/20/24 1400  Mon Mar 20, 2024  1046 Patient remains hemodynamically stable still in A-fib with rate oscillating between 90s to 120s.  She mentions to me that she did not take her Xarelto  yesterday.  Last dose would have been on January 3.  Discussed with cardiology who is aware of the patient and plans to evaluate shortly in the ED [MP]  1359 Cardiology (Dr Sheena) has evaluated patient in the ED.  She is now back in normal sinus rhythm.  She will start carvedilol  3.125 mg twice daily and follow-up in the A-fib clinic on January 15.  Appropriate for discharge this time [MP]    Clinical Course User Index [MP] Pamella Ozell LABOR, DO   Medical Decision Making I, Ozell Pamella DO, have assumed care of this patient from the previous provider pending cardiology consult for A-fib with RVR reevaluation and disposition  Amount and/or Complexity of Data Reviewed Labs: ordered. Radiology: ordered. ECG/medicine tests: ordered.  Risk Prescription drug management.          Pamella Ozell LABOR, DO 03/20/24 1400  "

## 2024-03-20 NOTE — ED Triage Notes (Signed)
 Pt BIB [facility] from home c/o high heart rate. Pt states HR was 102 when she called EMS. C/o chest pain x3 days. Per EMS, pt was in afib RVR in 160s. Known hx of afib. HR now in 110-130s. Allergic to aspirin. Denies N/V and SOB.  500ml NS given en route 130/70 HR 110-130 96% RA CBG 118

## 2024-03-20 NOTE — Telephone Encounter (Signed)
"   Disp Refills Start End   Continuous Glucose Sensor (FREESTYLE LIBRE 3 PLUS SENSOR) MISC 2 each 3 03/17/2024 --   Sig: Change sensor every 15 days.   Sent to pharmacy as: Continuous Glucose Sensor (FREESTYLE LIBRE 3 PLUS SENSOR) MISC   Notes to Pharmacy: DX E11.9   E-Prescribing Status: Receipt confirmed by pharmacy (03/17/2024 11:55 AM EST)     Already sent. "

## 2024-03-21 ENCOUNTER — Telehealth: Payer: Self-pay

## 2024-03-21 ENCOUNTER — Telehealth: Payer: Self-pay | Admitting: *Deleted

## 2024-03-21 NOTE — Telephone Encounter (Signed)
 Pharmacist on phone to say they need prescription changed to [facility] 2 and instructions to say how many times a day, not just use as directed. Norma at [facility] contacted and said she had seen request and gave put it on Dr Theophilus desk.

## 2024-03-21 NOTE — Telephone Encounter (Signed)
 Copied from CRM [id number]. Topic: General - Other >> Mar 21, 2024 11:51 AM Miranda [name] wrote: Reason for CRM:  [facility] cannot reach patient for the freestyle libre.

## 2024-03-21 NOTE — Telephone Encounter (Signed)
 Patient has an appointment 03/22/24.  Will discuss then.

## 2024-03-21 NOTE — Telephone Encounter (Unsigned)
 Copied from CRM [id number]. Topic: Clinical - Medication Question >> Mar 21, 2024 10:15 AM [name] wrote: Reason for CRM: [name] from [facility] has questions regarding the Continuous Glucose Sensor (Freestyle Libre 3 Plus Sensor). The pharmacy received a libre 3, but the patient has a libre 2. They are requesting a new prescription with the correct quantity and the frequency included

## 2024-03-21 NOTE — Telephone Encounter (Signed)
" °  [facility] 03/15/2024 ? 10:00AM @[place] Thank you so much for your order! Were ready to ship the supplies but have been having difficulty reaching the patient. Is there an alternative contact number we can try? If you happen to speak with the patient, please advise them to return our call at [phone]. Thanks again!    Left message on machine for patient to return my call. "

## 2024-03-22 ENCOUNTER — Other Ambulatory Visit: Payer: Self-pay | Admitting: Pulmonary Disease

## 2024-03-22 ENCOUNTER — Encounter: Payer: Self-pay | Admitting: Internal Medicine

## 2024-03-22 ENCOUNTER — Ambulatory Visit: Admitting: Internal Medicine

## 2024-03-22 VITALS — BP 120/84 | HR 82 | Temp 97.5°F | Wt 163.6 lb

## 2024-03-22 DIAGNOSIS — Z09 Encounter for follow-up examination after completed treatment for conditions other than malignant neoplasm: Secondary | ICD-10-CM | POA: Diagnosis not present

## 2024-03-22 DIAGNOSIS — J84112 Idiopathic pulmonary fibrosis: Secondary | ICD-10-CM

## 2024-03-22 DIAGNOSIS — I4891 Unspecified atrial fibrillation: Secondary | ICD-10-CM | POA: Diagnosis not present

## 2024-03-22 DIAGNOSIS — E119 Type 2 diabetes mellitus without complications: Secondary | ICD-10-CM | POA: Diagnosis not present

## 2024-03-22 DIAGNOSIS — M79605 Pain in left leg: Secondary | ICD-10-CM

## 2024-03-22 LAB — POCT GLYCOSYLATED HEMOGLOBIN (HGB A1C): Hemoglobin A1C: 6 % — AB (ref 4.0–5.6)

## 2024-03-22 NOTE — Telephone Encounter (Signed)
 Pt requesting refill of specialty medication - routing to Rx team to advise.

## 2024-03-22 NOTE — Telephone Encounter (Signed)
 Copied from CRM [id number]. Topic: Clinical - Medication Refill >> Mar 22, 2024  1:14 PM Isabell A wrote: Medication: Pirfenidone  801 MG TABS [[id number]]  Has the patient contacted their pharmacy? No (Agent: If no, request that the patient contact the pharmacy for the refill. If patient does not wish to contact the pharmacy document the reason why and proceed with request.) (Agent: If yes, when and what did the pharmacy advise?)  This is the patient's preferred pharmacy:  [facility] [address] ([address] [place] - [address] [place] ([place]) [place] [address] Phone: [phone] Fax: [phone]   Is this the correct pharmacy for this prescription? Yes If no, delete pharmacy and type the correct one.   Has the prescription been filled recently? Yes  Is the patient out of the medication? Yes  Has the patient been seen for an appointment in the last year OR does the patient have an upcoming appointment? Yes  Can we respond through MyChart? No  Agent: Please be advised that Rx refills may take up to 3 business days. We ask that you follow-up with your pharmacy.

## 2024-03-22 NOTE — Progress Notes (Signed)
 "    Established Patient Office Visit     CC/Reason for Visit: Left lower pain  HPI: Miranda [name] is a 79 y.o. female who is coming in today for the above mentioned reasons.  5 days ago she fell off her bed at home and hit her left shin on the stepstool.  She has significant bruising and pain but is able to bear weight and ambulate normally.  On January 5 she had to go to the emergency department for A-fib with RVR.  She has a follow-up with cardiology on the 15th.  She was started on Coreg , she is already anticoagulated on Xarelto .   Past Medical/Surgical History: Past Medical History:  Diagnosis Date   Anemia    Anxiety    Arthritis    Asthma    Atrial fibrillation (HCC)    CHF (congestive heart failure) (HCC)    pt. unsure- but thinks she was hosp. for CHF- 2002   [facility]   Clotting disorder    blood clots in lungs/PE pulmonary embolism   Colon polyps    Complication of anesthesia    states requires a lot med. to put her to sleep    DDD (degenerative disc disease) 09/17/2011   Depression    sometimes    DM (diabetes mellitus) (HCC)    Family history of anesthesia complication    Sister had difficulty waking up from anesthesia   GERD (gastroesophageal reflux disease)    Glaucoma    bilateral, pt. admits that she is noncompliant to eye gtts.    Hemorrhoids    Hyperlipidemia    Hypertension    had stress, echo- 2006 /w [place], Cardiac Cath, per pt. 2002, echo repeated 2012- wnl    IPF (idiopathic pulmonary fibrosis) (HCC)    Low back pain    Shortness of breath    Sleep apnea    uses c-pap- q night recently    Past Surgical History:  Procedure Laterality Date   ABDOMINAL HYSTERECTOMY     ectopic, fibroids   ANTERIOR CERVICAL DECOMP/DISCECTOMY FUSION N/A 09/11/2020   Procedure: Anterior Cervical Decompression Fusion Cervical four-five, Cervical five-six;  Surgeon: Debby Dorn MATSU, MD;  Location: [facility] OR;  Service:  Neurosurgery;  Laterality: N/A;   arm surgery Right    CARDIAC CATHETERIZATION     CATARACT EXTRACTION, BILATERAL     cataracts removed bilateral- ?IOL   COLONOSCOPY  11/2020   HD-MAC-prep good-TA   FISSURECTOMY  10/08/2011   Procedure: FISSURECTOMY;  Surgeon: Jina Nephew, MD;  Location: [facility] OR;  Service: General;  Laterality: N/A;   FLEXIBLE SIGMOIDOSCOPY  02/25/2011   Procedure: ENID MORIN;  Surgeon: [name] JONETTA Aho, MD;  Location: [facility] ENDOSCOPY;  Service: Endoscopy;  Laterality: N/A;   FOOT SURGERY     bilat, heel spurs- screw in R foot    HEMORRHOID SURGERY  10/08/2011   Procedure: HEMORRHOIDECTOMY;  Surgeon: Jina Nephew, MD;  Location: [facility] OR;  Service: General;  Laterality: N/A;  External    REVERSE SHOULDER ARTHROPLASTY Left 08/13/2022   Procedure: REVERSE SHOULDER ARTHROPLASTY;  Surgeon: Melita Drivers, MD;  Location: [facility] ORS;  Service: Orthopedics;  Laterality: Left;    ROTATOR CUFF REPAIR Right    SPHINCTEROTOMY  10/08/2011   Procedure: SPHINCTEROTOMY;  Surgeon: Jina Nephew, MD;  Location: [facility] OR;  Service: General;  Laterality: N/A;    Social History:  reports that she has never smoked. She has never been exposed  to tobacco smoke. She has never used smokeless tobacco. She reports that she does not drink alcohol and does not use drugs.  Allergies: Allergies[1]  Family History:  Family History  Problem Relation Age of Onset   Heart attack Mother    Heart disease Mother    Breast cancer Mother    Colon polyps Sister    Emphysema Sister    Arthritis/Rheumatoid Sister    Colon polyps Sister    Asthma Sister    Lung cancer Sister    COPD Sister    Breast cancer Sister    Prostate cancer Brother    Colon polyps Brother    Colon cancer Brother    Anesthesia problems Neg Hx    Esophageal cancer Neg Hx    Rectal cancer Neg Hx    Stomach cancer Neg Hx     Current Medications[2]  Review of Systems:  Negative unless indicated in HPI.   Physical  Exam: Vitals:   03/22/24 0928  BP: 120/84  Pulse: 82  Temp: (!) 97.5 F (36.4 C)  TempSrc: Oral  SpO2: 99%  Weight: 163 lb 9.6 oz (74.2 kg)    Body mass index is 27.22 kg/m.    Impression and Plan:  Diabetes mellitus without complication (HCC) -     POCT glycosylated hemoglobin (Hb A1C)  Left leg pain  Hospital discharge follow-up  Atrial fibrillation with RVR [facility])   [facility] charts reviewed in detail. - A1c is well-controlled at 6.0. - Has follow-up with cardiology, he is compliant with Xarelto  and Coreg . - Significant bruising of left leg likely due to her being anticoagulated, see no indication for an x-ray as she is able to bear weight.  Time spent:31 minutes reviewing chart, interviewing and examining patient and formulating plan of care.     Tully Theophilus Andrews, MD [facility]     [1]  Allergies Allergen Reactions   Cashew Nut Oil Shortness Of Breath   Fish Oil Anaphylaxis   Other Hives, Shortness Of Breath and Swelling    Allergic to cashew nuts and peanut oil.   Peanut Oil Shortness Of Breath   Penicillins Anaphylaxis, Hives and Swelling    Has patient had a PCN reaction causing immediate rash, facial/tongue/throat swelling, SOB or lightheadedness with hypotension: Face swelling and hives started first, then swelling of the throat  Has patient had a PCN reaction causing severe rash involving mucus membranes or skin necrosis: Yes  Has patient had a PCN reaction that required hospitalization: No  Has patient had a PCN reaction occurring within the last 10 years: Yes  If all of the above answers are NO, then may proceed with Cephalospor   Pneumococcal Vaccines Nausea And Vomiting   Aspirin Itching and Rash   Bactrim  [Sulfamethoxazole -Trimethoprim ] Hives, Itching and Rash   Ciprofloxacin  Hives, Itching and Rash   Ibuprofen Rash   Influenza Vaccines Hives   Ivp Dye [Iodinated Contrast Media] Hives, Itching and Rash     Gives benadryl  to counteract symptoms   Latex Rash   Macrobid  [Nitrofurantoin  Monohydrate Macrocrystals] Hives   Nitrofurantoin  Macrocrystal Hives   Shellfish Allergy Hives    Patient also allergic to seafood  [2]  Current Outpatient Medications:    acetaminophen  (TYLENOL ) 325 MG tablet, Take 650 mg by mouth every 6 (six) hours as needed for moderate pain., Disp: , Rfl:    albuterol  (VENTOLIN  HFA) 108 (90 Base) MCG/ACT inhaler, Inhale 1 puff into the lungs every 6 (six) hours  as needed., Disp: 18 g, Rfl: 1   ALPRAZolam  (XANAX ) 0.25 MG tablet, Take 1 tablet by mouth three times daily as needed, Disp: 90 tablet, Rfl: 0   amLODipine  (NORVASC ) 5 MG tablet, Take 1 tablet (5 mg total) by mouth daily., Disp: 90 tablet, Rfl: 1   atorvastatin  (LIPITOR ) 80 MG tablet, Take 1 tablet (80 mg total) by mouth every evening., Disp: 90 tablet, Rfl: 0   bisacodyl  (BISACODYL  LAXATIVE) 10 MG suppository, Place 1 suppository (10 mg total) rectally daily as needed for moderate constipation., Disp: 12 suppository, Rfl: 0   carvedilol  (COREG ) 3.125 MG tablet, Take 1 tablet (3.125 mg total) by mouth 2 (two) times daily with a meal., Disp: 60 tablet, Rfl: 1   Continuous Glucose Sensor (FREESTYLE LIBRE 3 PLUS SENSOR) MISC, Change sensor every 15 days., Disp: 2 each, Rfl: 3   cyclobenzaprine  (FLEXERIL ) 10 MG tablet, Take 1 tablet (10 mg total) by mouth 3 (three) times daily as needed for muscle spasms., Disp: 30 tablet, Rfl: 1   docusate sodium  (COLACE) 100 MG capsule, Take 1 capsule (100 mg total) by mouth 2 (two) times daily., Disp: 60 capsule, Rfl: 2   dronedarone  (MULTAQ ) 400 MG tablet, Take 1 tablet (400 mg total) by mouth 2 (two) times daily with a meal., Disp: 60 tablet, Rfl: 3   ezetimibe  (ZETIA ) 10 MG tablet, Take 1 tablet (10 mg total) by mouth daily., Disp: 90 tablet, Rfl: 1   hydrochlorothiazide  (HYDRODIURIL ) 25 MG tablet, Take 1 tablet (25 mg total) by mouth daily., Disp: 90 tablet, Rfl: 0    HYDROcodone -acetaminophen  (NORCO) 10-325 MG tablet, Take 1 tablet by mouth every 6 (six) hours as needed., Disp: 30 tablet, Rfl: 0   lactulose  (CHRONULAC ) 10 GM/15ML solution, Take 30 g by mouth 2 (two) times daily as needed., Disp: , Rfl:    losartan  (COZAAR ) 100 MG tablet, Take 1 tablet (100 mg total) by mouth every evening., Disp: 90 tablet, Rfl: 0   metFORMIN  (GLUCOPHAGE -XR) 500 MG 24 hr tablet, Take 1 tablet (500 mg total) by mouth daily with breakfast., Disp: 90 tablet, Rfl: 1   nitroGLYCERIN (NITRODUR - DOSED IN MG/24 HR) 0.2 mg/hr patch, Place 0.2 mg onto the skin daily., Disp: , Rfl:    Pirfenidone  801 MG TABS, Take 1 tablet (801 mg total) by mouth 3 (three) times daily., Disp: 270 tablet, Rfl: 1   PROCTO-MED HC  2.5 % rectal cream, PLACE 1 APPLICATION RECTALLY 2 (TIMES) DAILY., Disp: 28 g, Rfl: 0   Prucalopride Succinate  2 MG TABS, TAKE 1 TABLET BY MOUTH  ONCE DAILY AT NOON, Disp: 30 tablet, Rfl: 0   XARELTO  20 MG TABS tablet, TAKE 1 TABLET BY MOUTH ONCE DAILY IN THE EVENING, Disp: 90 tablet, Rfl: 0  "

## 2024-03-22 NOTE — Telephone Encounter (Signed)
 Spoke to the patient and she will call [facility].

## 2024-03-23 MED ORDER — PIRFENIDONE 801 MG PO TABS: 1.0000 | ORAL_TABLET | Freq: Three times a day (TID) | ORAL | 1 refills | Status: AC

## 2024-03-23 NOTE — Telephone Encounter (Signed)
 Refill sent for ESBRIET  to [facility]  Dose: 801mg  three times daily   Last OV: 12/01/23 Provider: Dr. Theophilus Pertinent labs: LFTs wnl 12/01/23  Next OV: 05/30/24  Aleck Puls, PharmD, BCPS Clinical Pharmacist  [facility]

## 2024-03-24 ENCOUNTER — Telehealth: Payer: Self-pay

## 2024-03-24 NOTE — Telephone Encounter (Unsigned)
 Copied from CRM [id number]. Topic: General - Other >> Mar 22, 2024  1:13 PM Isabell A wrote: Reason for CRM: Patient is inquiring about paperwork that was supposed to be faxed  in regard to Esbriet  - would like to confirm if this has been completed.    Callback number: [phone]

## 2024-03-27 ENCOUNTER — Other Ambulatory Visit: Payer: Self-pay | Admitting: Internal Medicine

## 2024-03-27 DIAGNOSIS — K5900 Constipation, unspecified: Secondary | ICD-10-CM

## 2024-03-27 NOTE — Telephone Encounter (Signed)
 I sent renewal of Esbriet  Rx last week to [facility], the new pharmacy contracted to handle patient assistance.   I called the patient to discuss if she had received a shipment. She reports she has been out for 2 weeks.   I called [employer] for more information.  - They do not make outreaches to the patient for shipment. Patient must call to set up shipment. Phone: [phone], M-F, 9A-6P EST  Given she has been off Esbriet  for 2 weeks, will need to restart titration. Transferred to pharmacist to give verbal order for 30 days supply of Esbriet  267mg  tablet: Take 1 tab three times daily for 7 days, then 2 tabs three times daily for 7 days, then 3 tabs three times daily thereafter. No refills.   Per representative, MDO or patient can request shipment. I requested shipment of this prescription for restart titration. I was advised this will be overnight delivery, will receive tomorrow on 1/13, no signature required, through [employer].   Called patient to review above information. She will need to call [phone], M-F, 9A-6P EST, to set up the following shipments. I reviewed she will restart titration using Esbriet  267mg  tablets as follows: Take 1 tab three times daily for 7 days, then 2 tabs three times daily for 7 days, then 3 tabs three times daily thereafter. Call to schedule next shipment when she has 1-2 weeks remaining. The NEXT shipment will RETURN to her USUAL REGIMEN of Esbriet  801mg  tablet, Take 1 tablet by mouth three times daily.   Patient verbalizes understanding and agreement with plan.

## 2024-03-27 NOTE — Progress Notes (Signed)
 Paperwork signed and placed in pharmacy box. nfn

## 2024-03-30 ENCOUNTER — Ambulatory Visit (HOSPITAL_COMMUNITY)
Admit: 2024-03-30 | Discharge: 2024-03-30 | Disposition: A | Discharge status: Home / Self Care | Attending: Internal Medicine | Admitting: Internal Medicine

## 2024-03-30 ENCOUNTER — Encounter (HOSPITAL_COMMUNITY): Payer: Self-pay | Admitting: Internal Medicine

## 2024-03-30 VITALS — BP 104/86 | HR 117 | Ht 65.0 in | Wt 159.2 lb

## 2024-03-30 DIAGNOSIS — I480 Paroxysmal atrial fibrillation: Secondary | ICD-10-CM | POA: Diagnosis not present

## 2024-03-30 DIAGNOSIS — I4819 Other persistent atrial fibrillation: Secondary | ICD-10-CM

## 2024-03-30 DIAGNOSIS — Z5181 Encounter for therapeutic drug level monitoring: Secondary | ICD-10-CM

## 2024-03-30 DIAGNOSIS — D6869 Other thrombophilia: Secondary | ICD-10-CM | POA: Diagnosis not present

## 2024-03-30 DIAGNOSIS — Z79899 Other long term (current) drug therapy: Secondary | ICD-10-CM | POA: Diagnosis not present

## 2024-03-30 NOTE — H&P (View-Only) (Signed)
 "   Primary Care Physician: Theophilus Andrews, Tully GRADE, MD Primary Cardiologist: Peter Jordan, MD Electrophysiologist: Will Gladis Norton, MD     Referring Physician: Dr. Norton Hurtis LITTIE [name] is a 79 y.o. female with a history of HTN, T2DM, CAD, and paroxsymal atrial fibrillation who presents for consultation in the [facility].  She is on Multaq  for rhythm control. Patient is on Eliquis  5 mg BID for a CHADS2VASC score of 6.  Follow-up 03/30/24.  Patient is currently in Afib with RVR.  She was seen in the ED on 03/20/2024 for palpitations found to be back in A-fib.  She had consultation by Dr. Sheena.  The cardiologist noted that patient missed the dose of Xarelto  and Multaq  the day before ED visit.  They started carvedilol  3.125 mg twice daily and instructed to not take if heart rate is less than 60 bpm.  No missed doses of Xarelto  since 1/6.  Today, she denies symptoms of chest pain, shortness of breath, orthopnea, PND, lower extremity edema, dizziness, presyncope, syncope, snoring, daytime somnolence, bleeding, or neurologic sequela. The patient is tolerating medications without difficulties and is otherwise without complaint today.   she has a BMI of Body mass index is 26.49 kg/m.SABRA Filed Weights   03/30/24 1010  Weight: 72.2 kg     Current Outpatient Medications  Medication Sig Dispense Refill   acetaminophen  (TYLENOL ) 325 MG tablet Take 650 mg by mouth every 6 (six) hours as needed for moderate pain.     albuterol  (VENTOLIN  HFA) 108 (90 Base) MCG/ACT inhaler Inhale 1 puff into the lungs every 6 (six) hours as needed. 18 g 1   ALPRAZolam  (XANAX ) 0.25 MG tablet Take 1 tablet by mouth three times daily as needed 90 tablet 0   amLODipine  (NORVASC ) 5 MG tablet Take 1 tablet (5 mg total) by mouth daily. 90 tablet 1   atorvastatin  (LIPITOR ) 80 MG tablet Take 1 tablet (80 mg total) by mouth every evening. 90 tablet 0   bisacodyl  (BISACODYL  LAXATIVE) 10 MG  suppository Place 1 suppository (10 mg total) rectally daily as needed for moderate constipation. 12 suppository 0   carvedilol  (COREG ) 3.125 MG tablet Take 1 tablet (3.125 mg total) by mouth 2 (two) times daily with a meal. 60 tablet 1   Continuous Glucose Sensor (FREESTYLE LIBRE 3 PLUS SENSOR) MISC Change sensor every 15 days. 2 each 3   cyclobenzaprine  (FLEXERIL ) 10 MG tablet Take 1 tablet (10 mg total) by mouth 3 (three) times daily as needed for muscle spasms. 30 tablet 1   docusate sodium  (COLACE) 100 MG capsule Take 1 capsule (100 mg total) by mouth 2 (two) times daily. 60 capsule 2   dronedarone  (MULTAQ ) 400 MG tablet Take 1 tablet (400 mg total) by mouth 2 (two) times daily with a meal. 60 tablet 3   ezetimibe  (ZETIA ) 10 MG tablet Take 1 tablet (10 mg total) by mouth daily. 90 tablet 1   hydrochlorothiazide  (HYDRODIURIL ) 25 MG tablet Take 1 tablet (25 mg total) by mouth daily. 90 tablet 0   HYDROcodone -acetaminophen  (NORCO) 10-325 MG tablet Take 1 tablet by mouth every 6 (six) hours as needed. 30 tablet 0   lactulose  (CHRONULAC ) 10 GM/15ML solution Take 30 g by mouth 2 (two) times daily as needed.     losartan  (COZAAR ) 100 MG tablet Take 1 tablet (100 mg total) by mouth every evening. 90 tablet 0   metFORMIN  (GLUCOPHAGE -XR) 500 MG 24 hr tablet Take  1 tablet (500 mg total) by mouth daily with breakfast. 90 tablet 1   nitroGLYCERIN (NITRODUR - DOSED IN MG/24 HR) 0.2 mg/hr patch Place 0.2 mg onto the skin daily.     Pirfenidone  801 MG TABS Take 1 tablet (801 mg total) by mouth 3 (three) times daily. 270 tablet 1   PROCTO-MED HC  2.5 % rectal cream PLACE 1 APPLICATION RECTALLY 2 (TIMES) DAILY. 28 g 0   Prucalopride Succinate  2 MG TABS TAKE 1  BY MOUTH ONCE DAILY AT NOON 30 tablet 0   XARELTO  20 MG TABS tablet TAKE 1 TABLET BY MOUTH ONCE DAILY IN THE EVENING 90 tablet 0   No current facility-administered medications for this encounter.    Atrial Fibrillation Management history:  Previous  antiarrhythmic drugs: Multaq  Previous cardioversions: none Previous ablations: none Anticoagulation history: Eliquis    ROS- All systems are reviewed and negative except as per the HPI above.  Physical Exam: BP 104/86   Pulse (!) 117   Ht 5' 5 (1.651 m)   Wt 72.2 kg   BMI 26.49 kg/m   GEN- The patient is well appearing, alert and oriented x 3 today.   Neck - no JVD or carotid bruit noted Lungs- Clear to ausculation bilaterally, normal work of breathing Heart- Irregular tachycardic rate and rhythm, no murmurs, rubs or gallops, PMI not laterally displaced Extremities- no clubbing, cyanosis, or edema Skin - no rash or ecchymosis noted   EKG today demonstrates  EKG Interpretation Date/Time:  Thursday March 30 2024 10:12:29 EST Ventricular Rate:  117 PR Interval:    QRS Duration:  70 QT Interval:  326 QTC Calculation: 454 R Axis:   16  Text Interpretation: Atrial fibrillation with rapid ventricular response Nonspecific T wave abnormality Abnormal ECG When compared with ECG of 20-Mar-2024 03:26, PREVIOUS ECG IS PRESENT Confirmed by Terra Pac ([id number]) on 03/30/2024 10:44:49 AM    Echo 04/14/21 demonstrated  1. Left ventricular ejection fraction, by estimation, is 55 to 60%. The  left ventricle has normal function. The left ventricle has no regional  wall motion abnormalities. Left ventricular diastolic parameters are  consistent with Grade I diastolic  dysfunction (impaired relaxation).   2. Right ventricular systolic function is normal. The right ventricular  size is normal. There is normal pulmonary artery systolic pressure.   3. The mitral valve is normal in structure. No evidence of mitral valve  regurgitation. No evidence of mitral stenosis.   4. The aortic valve is normal in structure. Aortic valve regurgitation is  not visualized. Aortic valve sclerosis/calcification is present, without  any evidence of aortic stenosis.   5. The inferior vena cava is normal in size  with greater than 50%  respiratory variability, suggesting right atrial pressure of 3 mmHg.    ASSESSMENT & PLAN CHA2DS2-VASc Score = 6  The patient's score is based upon: CHF History: 0 HTN History: 1 Diabetes History: 1 Stroke History: 0 Vascular Disease History: 1 Age Score: 2 Gender Score: 1       ASSESSMENT AND PLAN: Persistent Atrial Fibrillation (ICD10:  I48.0) The patient's CHA2DS2-VASc score is 6, indicating a 9.7% annual risk of stroke.    Patient is currently in Afib with RVR.  Son notes that patient has had increased stress because she is trying to raise two teenagers and states she should not be doing this. We discussed the procedure cardioversion to try to convert to NSR. We discussed the risks vs benefits of this procedure and how ultimately we cannot predict  whether a patient will have early return of arrhythmia post procedure. After discussion, the patient wishes to proceed with cardioversion. Due to missed dose of Xarelto , will schedule after 3 weeks of uninterrupted anticoagulation. Labs drawn on 03/20/24.  Informed Consent   Shared Decision Making/Informed Consent The risks (stroke, cardiac arrhythmias rarely resulting in the need for a temporary or permanent pacemaker, skin irritation or burns and complications associated with conscious sedation including aspiration, arrhythmia, respiratory failure and death), benefits (restoration of normal sinus rhythm) and alternatives of a direct current cardioversion were explained in detail to Ms. Inthavong and she agrees to proceed.        Secondary Hypercoagulable State (ICD10:  D68.69) The patient is at significant risk for stroke/thromboembolism based upon her CHA2DS2-VASc Score of 6.  Continue Rivaroxaban  (Xarelto ).  No missed doses since 1/6. Emphasis on compliance with DOAC.   High risk medication monitoring (ICD10: [id number]) Patient requires ongoing monitoring for anti-arrhythmic medication which has the potential to  cause life threatening arrhythmias or AV block. ECG intervals are stable. Continue Multaq  400 mg BID. She possibly missed a dose before ED visit. We briefly discussed other AAD therapy vs ablation if patient has ERAF.     Follow up 2 weeks after DCCV.   Terra Pac, PA-C  Afib Clinic [facility] [address] [place], [place] [address] [phone]  "

## 2024-03-30 NOTE — Progress Notes (Signed)
 "   Primary Care Physician: Theophilus Andrews, Tully GRADE, MD Primary Cardiologist: Peter Jordan, MD Electrophysiologist: Will Gladis Norton, MD     Referring Physician: Dr. Norton Hurtis Miranda [name] is a 79 y.o. female with a history of HTN, T2DM, CAD, and paroxsymal atrial fibrillation who presents for consultation in the [facility].  She is on Multaq  for rhythm control. Patient is on Eliquis  5 mg BID for a CHADS2VASC score of 6.  Follow-up 03/30/24.  Patient is currently in Afib with RVR.  She was seen in the ED on 03/20/2024 for palpitations found to be back in A-fib.  She had consultation by Dr. Sheena.  The cardiologist noted that patient missed the dose of Xarelto  and Multaq  the day before ED visit.  They started carvedilol  3.125 mg twice daily and instructed to not take if heart rate is less than 60 bpm.  No missed doses of Xarelto  since 1/6.  Today, she denies symptoms of chest pain, shortness of breath, orthopnea, PND, lower extremity edema, dizziness, presyncope, syncope, snoring, daytime somnolence, bleeding, or neurologic sequela. The patient is tolerating medications without difficulties and is otherwise without complaint today.   she has a BMI of Body mass index is 26.49 kg/m.Miranda [name] Filed Weights   03/30/24 1010  Weight: 72.2 kg     Current Outpatient Medications  Medication Sig Dispense Refill   acetaminophen  (TYLENOL ) 325 MG tablet Take 650 mg by mouth every 6 (six) hours as needed for moderate pain.     albuterol  (VENTOLIN  HFA) 108 (90 Base) MCG/ACT inhaler Inhale 1 puff into the lungs every 6 (six) hours as needed. 18 g 1   ALPRAZolam  (XANAX ) 0.25 MG tablet Take 1 tablet by mouth three times daily as needed 90 tablet 0   amLODipine  (NORVASC ) 5 MG tablet Take 1 tablet (5 mg total) by mouth daily. 90 tablet 1   atorvastatin  (LIPITOR ) 80 MG tablet Take 1 tablet (80 mg total) by mouth every evening. 90 tablet 0   bisacodyl  (BISACODYL  LAXATIVE) 10 MG  suppository Place 1 suppository (10 mg total) rectally daily as needed for moderate constipation. 12 suppository 0   carvedilol  (COREG ) 3.125 MG tablet Take 1 tablet (3.125 mg total) by mouth 2 (two) times daily with a meal. 60 tablet 1   Continuous Glucose Sensor (FREESTYLE LIBRE 3 PLUS SENSOR) MISC Change sensor every 15 days. 2 each 3   cyclobenzaprine  (FLEXERIL ) 10 MG tablet Take 1 tablet (10 mg total) by mouth 3 (three) times daily as needed for muscle spasms. 30 tablet 1   docusate sodium  (COLACE) 100 MG capsule Take 1 capsule (100 mg total) by mouth 2 (two) times daily. 60 capsule 2   dronedarone  (MULTAQ ) 400 MG tablet Take 1 tablet (400 mg total) by mouth 2 (two) times daily with a meal. 60 tablet 3   ezetimibe  (ZETIA ) 10 MG tablet Take 1 tablet (10 mg total) by mouth daily. 90 tablet 1   hydrochlorothiazide  (HYDRODIURIL ) 25 MG tablet Take 1 tablet (25 mg total) by mouth daily. 90 tablet 0   HYDROcodone -acetaminophen  (NORCO) 10-325 MG tablet Take 1 tablet by mouth every 6 (six) hours as needed. 30 tablet 0   lactulose  (CHRONULAC ) 10 GM/15ML solution Take 30 g by mouth 2 (two) times daily as needed.     losartan  (COZAAR ) 100 MG tablet Take 1 tablet (100 mg total) by mouth every evening. 90 tablet 0   metFORMIN  (GLUCOPHAGE -XR) 500 MG 24 hr tablet Take  1 tablet (500 mg total) by mouth daily with breakfast. 90 tablet 1   nitroGLYCERIN (NITRODUR - DOSED IN MG/24 HR) 0.2 mg/hr patch Place 0.2 mg onto the skin daily.     Pirfenidone  801 MG TABS Take 1 tablet (801 mg total) by mouth 3 (three) times daily. 270 tablet 1   PROCTO-MED HC  2.5 % rectal cream PLACE 1 APPLICATION RECTALLY 2 (TIMES) DAILY. 28 g 0   Prucalopride Succinate  2 MG TABS TAKE 1  BY MOUTH ONCE DAILY AT NOON 30 tablet 0   XARELTO  20 MG TABS tablet TAKE 1 TABLET BY MOUTH ONCE DAILY IN THE EVENING 90 tablet 0   No current facility-administered medications for this encounter.    Atrial Fibrillation Management history:  Previous  antiarrhythmic drugs: Multaq  Previous cardioversions: none Previous ablations: none Anticoagulation history: Eliquis    ROS- All systems are reviewed and negative except as per the HPI above.  Physical Exam: BP 104/86   Pulse (!) 117   Ht 5' 5 (1.651 m)   Wt 72.2 kg   BMI 26.49 kg/m   GEN- The patient is well appearing, alert and oriented x 3 today.   Neck - no JVD or carotid bruit noted Lungs- Clear to ausculation bilaterally, normal work of breathing Heart- Irregular tachycardic rate and rhythm, no murmurs, rubs or gallops, PMI not laterally displaced Extremities- no clubbing, cyanosis, or edema Skin - no rash or ecchymosis noted   EKG today demonstrates  EKG Interpretation Date/Time:  Thursday March 30 2024 10:12:29 EST Ventricular Rate:  117 PR Interval:    QRS Duration:  70 QT Interval:  326 QTC Calculation: 454 R Axis:   16  Text Interpretation: Atrial fibrillation with rapid ventricular response Nonspecific T wave abnormality Abnormal ECG When compared with ECG of 20-Mar-2024 03:26, PREVIOUS ECG IS PRESENT Confirmed by Terra Pac ([id number]) on 03/30/2024 10:44:49 AM    Echo 04/14/21 demonstrated  1. Left ventricular ejection fraction, by estimation, is 55 to 60%. The  left ventricle has normal function. The left ventricle has no regional  wall motion abnormalities. Left ventricular diastolic parameters are  consistent with Grade I diastolic  dysfunction (impaired relaxation).   2. Right ventricular systolic function is normal. The right ventricular  size is normal. There is normal pulmonary artery systolic pressure.   3. The mitral valve is normal in structure. No evidence of mitral valve  regurgitation. No evidence of mitral stenosis.   4. The aortic valve is normal in structure. Aortic valve regurgitation is  not visualized. Aortic valve sclerosis/calcification is present, without  any evidence of aortic stenosis.   5. The inferior vena cava is normal in size  with greater than 50%  respiratory variability, suggesting right atrial pressure of 3 mmHg.    ASSESSMENT & PLAN CHA2DS2-VASc Score = 6  The patient's score is based upon: CHF History: 0 HTN History: 1 Diabetes History: 1 Stroke History: 0 Vascular Disease History: 1 Age Score: 2 Gender Score: 1       ASSESSMENT AND PLAN: Persistent Atrial Fibrillation (ICD10:  I48.0) The patient's CHA2DS2-VASc score is 6, indicating a 9.7% annual risk of stroke.    Patient is currently in Afib with RVR.  Son notes that patient has had increased stress because she is trying to raise two teenagers and states she should not be doing this. We discussed the procedure cardioversion to try to convert to NSR. We discussed the risks vs benefits of this procedure and how ultimately we cannot predict  whether a patient will have early return of arrhythmia post procedure. After discussion, the patient wishes to proceed with cardioversion. Due to missed dose of Xarelto , will schedule after 3 weeks of uninterrupted anticoagulation. Labs drawn on 03/20/24.  Informed Consent   Shared Decision Making/Informed Consent The risks (stroke, cardiac arrhythmias rarely resulting in the need for a temporary or permanent pacemaker, skin irritation or burns and complications associated with conscious sedation including aspiration, arrhythmia, respiratory failure and death), benefits (restoration of normal sinus rhythm) and alternatives of a direct current cardioversion were explained in detail to Miranda [name] and she agrees to proceed.        Secondary Hypercoagulable State (ICD10:  D68.69) The patient is at significant risk for stroke/thromboembolism based upon her CHA2DS2-VASc Score of 6.  Continue Rivaroxaban  (Xarelto ).  No missed doses since 1/6. Emphasis on compliance with DOAC.   High risk medication monitoring (ICD10: [id number]) Patient requires ongoing monitoring for anti-arrhythmic medication which has the potential to  cause life threatening arrhythmias or AV block. ECG intervals are stable. Continue Multaq  400 mg BID. She possibly missed a dose before ED visit. We briefly discussed other AAD therapy vs ablation if patient has ERAF.     Follow up 2 weeks after DCCV.   Terra Pac, PA-C  Afib Clinic [facility] [address] [place], [place] [address] [phone]  "

## 2024-03-30 NOTE — Patient Instructions (Addendum)
 Hold metformin  the morning of procedure   Take carvedilol  (COREG ) if HR is 60 or above and SBP is 100 or above   Cardioversion scheduled for: 04/12/24 Wednesday 8:00 am    - Arrive at the [facility] ([address]  and check in with ADMITTING at 8:00 am    - Do not eat or drink anything after midnight the night prior to your procedure.   - Take all your morning medication (except diabetic medications) with a sip of water prior to arrival.  - Do NOT miss any doses of your blood thinner - if you should miss a dose or take a dose more than 4 hours late -- please notify our office immediately.  - You will not be able to drive home after your procedure. Please ensure you have a responsible adult to drive you home. You will need someone with you for 24 hours post procedure.     - Expect to be in the procedural area approximately 2 hours.   - If you feel as if you go back into normal rhythm prior to scheduled cardioversion, please notify our office immediately.   If your procedure is canceled in the cardioversion suite you will be charged a cancellation fee.

## 2024-04-05 ENCOUNTER — Ambulatory Visit (HOSPITAL_COMMUNITY): Admitting: Internal Medicine

## 2024-04-07 ENCOUNTER — Other Ambulatory Visit: Payer: Self-pay | Admitting: Internal Medicine

## 2024-04-07 DIAGNOSIS — I10 Essential (primary) hypertension: Secondary | ICD-10-CM

## 2024-04-07 DIAGNOSIS — E785 Hyperlipidemia, unspecified: Secondary | ICD-10-CM

## 2024-04-11 ENCOUNTER — Encounter (HOSPITAL_COMMUNITY): Payer: Self-pay | Admitting: Internal Medicine

## 2024-04-12 ENCOUNTER — Encounter (HOSPITAL_COMMUNITY): Payer: Self-pay | Admitting: Certified Registered Nurse Anesthetist

## 2024-04-12 ENCOUNTER — Encounter (HOSPITAL_COMMUNITY)
Admission: RE | Disposition: A | Discharge status: Home / Self Care | Payer: Self-pay | Source: Home / Self Care | Attending: Internal Medicine

## 2024-04-12 ENCOUNTER — Ambulatory Visit (HOSPITAL_COMMUNITY)
Admission: RE | Admit: 2024-04-12 | Discharge: 2024-04-12 | Disposition: A | Discharge status: Home / Self Care | Attending: Internal Medicine | Admitting: Internal Medicine

## 2024-04-12 DIAGNOSIS — Z7984 Long term (current) use of oral hypoglycemic drugs: Secondary | ICD-10-CM | POA: Diagnosis not present

## 2024-04-12 DIAGNOSIS — I4819 Other persistent atrial fibrillation: Secondary | ICD-10-CM | POA: Insufficient documentation

## 2024-04-12 DIAGNOSIS — I10 Essential (primary) hypertension: Secondary | ICD-10-CM | POA: Diagnosis not present

## 2024-04-12 DIAGNOSIS — Z7901 Long term (current) use of anticoagulants: Secondary | ICD-10-CM | POA: Insufficient documentation

## 2024-04-12 DIAGNOSIS — D6869 Other thrombophilia: Secondary | ICD-10-CM | POA: Insufficient documentation

## 2024-04-12 DIAGNOSIS — Z79899 Other long term (current) drug therapy: Secondary | ICD-10-CM | POA: Insufficient documentation

## 2024-04-12 DIAGNOSIS — Z539 Procedure and treatment not carried out, unspecified reason: Secondary | ICD-10-CM | POA: Insufficient documentation

## 2024-04-12 DIAGNOSIS — E119 Type 2 diabetes mellitus without complications: Secondary | ICD-10-CM | POA: Diagnosis not present

## 2024-04-12 NOTE — Interval H&P Note (Signed)
 History and Physical Interval Note:  04/12/2024 8:02 AM  Miranda [name]  has presented today for surgery, with the diagnosis of AFIB.  The various methods of treatment have been discussed with the patient and family. After consideration of risks, benefits and other options for treatment, the patient has consented to  Procedures: CARDIOVERSION (N/A) as a surgical intervention.  The patient's history has been reviewed, patient examined, no change in status, stable for surgery.  I have reviewed the patient's chart and labs.  Questions were answered to the patient's satisfaction.     Ming Kunka A Ashyia Schraeder

## 2024-04-12 NOTE — Anesthesia Preprocedure Evaluation (Signed)
"                                    Anesthesia Evaluation  Patient identified by MRN, date of birth, ID band Patient awake    Reviewed: Allergy & Precautions, NPO status , Patient's Chart, lab work & pertinent test results, reviewed documented beta blocker date and time   Airway Mallampati: II  TM Distance: >3 FB Neck ROM: Full    Dental  (+) Teeth Intact, Dental Advisory Given   Pulmonary asthma , sleep apnea , PE Pulmonary fibrosis    Pulmonary exam normal breath sounds clear to auscultation       Cardiovascular hypertension, Pt. on medications and Pt. on home beta blockers + CAD and +CHF  + dysrhythmias Atrial Fibrillation  Rhythm:Irregular Rate:Abnormal     Neuro/Psych  PSYCHIATRIC DISORDERS Anxiety Depression     Neuromuscular disease    GI/Hepatic Neg liver ROS,GERD  ,,  Endo/Other  diabetes, Type 2, Oral Hypoglycemic Agents    Renal/GU Renal InsufficiencyRenal disease     Musculoskeletal  (+) Arthritis ,    Abdominal   Peds  Hematology  (+) Blood dyscrasia (Xarelto )   Anesthesia Other Findings Day of surgery medications reviewed with the patient.  Reproductive/Obstetrics                              Anesthesia Physical Anesthesia Plan  ASA: 3  Anesthesia Plan: General   Post-op Pain Management: Minimal or no pain anticipated   Induction: Intravenous  PONV Risk Score and Plan: 3 and TIVA  Airway Management Planned: Natural Airway and Simple Face Mask  Additional Equipment:   Intra-op Plan:   Post-operative Plan:   Informed Consent: I have reviewed the patients History and Physical, chart, labs and discussed the procedure including the risks, benefits and alternatives for the proposed anesthesia with the patient or authorized representative who has indicated his/her understanding and acceptance.     Dental advisory given  Plan Discussed with: CRNA  Anesthesia Plan Comments:          Anesthesia Quick Evaluation  "

## 2024-04-12 NOTE — Progress Notes (Signed)
 Patient presented in sinus bradycardia.   Patient is feeling well and will be discharged.  F/u 04/19/24 afib clinic.  [place] Canty A Oda [place], MD

## 2024-04-14 ENCOUNTER — Ambulatory Visit (HOSPITAL_COMMUNITY): Admitting: Internal Medicine

## 2024-04-17 ENCOUNTER — Other Ambulatory Visit (HOSPITAL_COMMUNITY): Payer: Self-pay

## 2024-04-17 ENCOUNTER — Telehealth: Payer: Self-pay | Admitting: Pharmacy Technician

## 2024-04-18 ENCOUNTER — Encounter (HOSPITAL_COMMUNITY): Payer: Self-pay | Admitting: Internal Medicine

## 2024-04-19 ENCOUNTER — Other Ambulatory Visit (HOSPITAL_COMMUNITY): Payer: Self-pay

## 2024-04-19 ENCOUNTER — Telehealth: Payer: Self-pay | Admitting: Internal Medicine

## 2024-04-19 ENCOUNTER — Ambulatory Visit (HOSPITAL_COMMUNITY): Admitting: Internal Medicine

## 2024-04-19 NOTE — Telephone Encounter (Signed)
Letter ready for pick up and patient is aware. 

## 2024-04-19 NOTE — Telephone Encounter (Signed)
 Copied from CRM #[id number]. Topic: General - Other >> Apr 19, 2024 10:26 AM Rea ORN wrote: Reason for CRM: pt got a letter yesterday for jury duty. She is looking for a medical release from PCP so that she does not have to do it.  Please call back to advise,  [phone]

## 2024-05-30 ENCOUNTER — Ambulatory Visit: Admitting: Pulmonary Disease
